# Patient Record
Sex: Male | Born: 1945 | Race: White | Hispanic: No | Marital: Married | Smoking: Former smoker
Health system: Southern US, Community
[De-identification: ages and names within clinical notes are randomized; demographics above are authoritative.]

## PROBLEM LIST (undated history)

## (undated) DIAGNOSIS — I219 Acute myocardial infarction, unspecified: Secondary | ICD-10-CM

## (undated) DIAGNOSIS — I739 Peripheral vascular disease, unspecified: Secondary | ICD-10-CM

## (undated) DIAGNOSIS — F32A Depression, unspecified: Secondary | ICD-10-CM

## (undated) DIAGNOSIS — C3490 Malignant neoplasm of unspecified part of unspecified bronchus or lung: Secondary | ICD-10-CM

## (undated) DIAGNOSIS — D126 Benign neoplasm of colon, unspecified: Secondary | ICD-10-CM

## (undated) DIAGNOSIS — T792XXA Traumatic secondary and recurrent hemorrhage and seroma, initial encounter: Secondary | ICD-10-CM

## (undated) DIAGNOSIS — Z720 Tobacco use: Secondary | ICD-10-CM

## (undated) DIAGNOSIS — F1010 Alcohol abuse, uncomplicated: Secondary | ICD-10-CM

## (undated) DIAGNOSIS — Z95810 Presence of automatic (implantable) cardiac defibrillator: Secondary | ICD-10-CM

## (undated) DIAGNOSIS — Z950 Presence of cardiac pacemaker: Secondary | ICD-10-CM

## (undated) DIAGNOSIS — J449 Chronic obstructive pulmonary disease, unspecified: Secondary | ICD-10-CM

## (undated) DIAGNOSIS — M1990 Unspecified osteoarthritis, unspecified site: Secondary | ICD-10-CM

## (undated) DIAGNOSIS — D696 Thrombocytopenia, unspecified: Secondary | ICD-10-CM

## (undated) DIAGNOSIS — E785 Hyperlipidemia, unspecified: Secondary | ICD-10-CM

## (undated) DIAGNOSIS — I10 Essential (primary) hypertension: Secondary | ICD-10-CM

## (undated) DIAGNOSIS — I509 Heart failure, unspecified: Secondary | ICD-10-CM

## (undated) DIAGNOSIS — F329 Major depressive disorder, single episode, unspecified: Secondary | ICD-10-CM

## (undated) DIAGNOSIS — K7030 Alcoholic cirrhosis of liver without ascites: Secondary | ICD-10-CM

## (undated) DIAGNOSIS — F419 Anxiety disorder, unspecified: Secondary | ICD-10-CM

## (undated) DIAGNOSIS — I447 Left bundle-branch block, unspecified: Secondary | ICD-10-CM

## (undated) DIAGNOSIS — I679 Cerebrovascular disease, unspecified: Secondary | ICD-10-CM

## (undated) HISTORY — PX: BACK SURGERY: SHX140

## (undated) HISTORY — PX: JOINT REPLACEMENT: SHX530

## (undated) HISTORY — DX: Essential (primary) hypertension: I10

## (undated) HISTORY — DX: Thrombocytopenia, unspecified: D69.6

## (undated) HISTORY — DX: Depression, unspecified: F32.A

## (undated) HISTORY — DX: Left bundle-branch block, unspecified: I44.7

## (undated) HISTORY — DX: Acute myocardial infarction, unspecified: I21.9

## (undated) HISTORY — DX: Peripheral vascular disease, unspecified: I73.9

## (undated) HISTORY — PX: INSERT / REPLACE / REMOVE PACEMAKER: SUR710

## (undated) HISTORY — DX: Alcohol abuse, uncomplicated: F10.10

## (undated) HISTORY — DX: Unspecified osteoarthritis, unspecified site: M19.90

## (undated) HISTORY — DX: Tobacco use: Z72.0

## (undated) HISTORY — DX: Chronic obstructive pulmonary disease, unspecified: J44.9

## (undated) HISTORY — DX: Alcoholic cirrhosis of liver without ascites: K70.30

## (undated) HISTORY — DX: Cerebrovascular disease, unspecified: I67.9

## (undated) HISTORY — DX: Major depressive disorder, single episode, unspecified: F32.9

## (undated) HISTORY — DX: Anxiety disorder, unspecified: F41.9

## (undated) HISTORY — DX: Benign neoplasm of colon, unspecified: D12.6

## (undated) HISTORY — DX: Hyperlipidemia, unspecified: E78.5

## (undated) HISTORY — DX: Heart failure, unspecified: I50.9

---

## 1969-11-26 HISTORY — PX: VASECTOMY: SHX75

## 2001-07-17 ENCOUNTER — Emergency Department (HOSPITAL_COMMUNITY): Admission: EM | Admit: 2001-07-17 | Discharge: 2001-07-17 | Payer: Self-pay | Admitting: Emergency Medicine

## 2003-07-02 ENCOUNTER — Ambulatory Visit (HOSPITAL_COMMUNITY): Admission: RE | Admit: 2003-07-02 | Discharge: 2003-07-02 | Payer: Self-pay | Admitting: Pulmonary Disease

## 2003-07-23 ENCOUNTER — Inpatient Hospital Stay (HOSPITAL_COMMUNITY): Admission: RE | Admit: 2003-07-23 | Discharge: 2003-07-26 | Payer: Self-pay | Admitting: Neurosurgery

## 2003-07-23 ENCOUNTER — Encounter: Payer: Self-pay | Admitting: Neurosurgery

## 2006-07-11 ENCOUNTER — Ambulatory Visit (HOSPITAL_COMMUNITY): Admission: RE | Admit: 2006-07-11 | Discharge: 2006-07-11 | Payer: Self-pay | Admitting: Pulmonary Disease

## 2007-07-02 ENCOUNTER — Encounter: Payer: Self-pay | Admitting: Orthopedic Surgery

## 2007-07-02 ENCOUNTER — Ambulatory Visit (HOSPITAL_COMMUNITY): Admission: RE | Admit: 2007-07-02 | Discharge: 2007-07-02 | Payer: Self-pay | Admitting: Pulmonary Disease

## 2007-07-10 ENCOUNTER — Ambulatory Visit (HOSPITAL_COMMUNITY): Admission: RE | Admit: 2007-07-10 | Discharge: 2007-07-10 | Payer: Self-pay | Admitting: Pulmonary Disease

## 2007-07-10 ENCOUNTER — Encounter: Payer: Self-pay | Admitting: Orthopedic Surgery

## 2007-07-23 ENCOUNTER — Ambulatory Visit: Payer: Self-pay | Admitting: Orthopedic Surgery

## 2007-09-30 ENCOUNTER — Encounter: Payer: Self-pay | Admitting: Orthopedic Surgery

## 2007-10-02 ENCOUNTER — Ambulatory Visit: Payer: Self-pay | Admitting: Orthopedic Surgery

## 2007-10-02 DIAGNOSIS — M19019 Primary osteoarthritis, unspecified shoulder: Secondary | ICD-10-CM | POA: Insufficient documentation

## 2007-11-27 DIAGNOSIS — I679 Cerebrovascular disease, unspecified: Secondary | ICD-10-CM

## 2007-11-27 HISTORY — DX: Cerebrovascular disease, unspecified: I67.9

## 2007-12-08 ENCOUNTER — Emergency Department (HOSPITAL_COMMUNITY): Admission: EM | Admit: 2007-12-08 | Discharge: 2007-12-08 | Payer: Self-pay | Admitting: Emergency Medicine

## 2008-01-02 ENCOUNTER — Ambulatory Visit (HOSPITAL_COMMUNITY): Admission: RE | Admit: 2008-01-02 | Discharge: 2008-01-02 | Payer: Self-pay | Admitting: Pulmonary Disease

## 2008-02-05 ENCOUNTER — Encounter: Admission: RE | Admit: 2008-02-05 | Discharge: 2008-02-05 | Payer: Self-pay | Admitting: Neurology

## 2008-02-18 ENCOUNTER — Ambulatory Visit (HOSPITAL_COMMUNITY): Admission: RE | Admit: 2008-02-18 | Discharge: 2008-02-18 | Payer: Self-pay | Admitting: Neurology

## 2008-02-20 ENCOUNTER — Encounter: Payer: Self-pay | Admitting: Interventional Radiology

## 2008-03-10 ENCOUNTER — Inpatient Hospital Stay (HOSPITAL_COMMUNITY): Admission: AD | Admit: 2008-03-10 | Discharge: 2008-03-11 | Payer: Self-pay | Admitting: Interventional Radiology

## 2008-03-24 ENCOUNTER — Encounter: Payer: Self-pay | Admitting: Interventional Radiology

## 2008-04-02 ENCOUNTER — Encounter: Payer: Self-pay | Admitting: Orthopedic Surgery

## 2008-07-02 ENCOUNTER — Ambulatory Visit (HOSPITAL_COMMUNITY): Admission: RE | Admit: 2008-07-02 | Discharge: 2008-07-02 | Payer: Self-pay | Admitting: Interventional Radiology

## 2008-08-23 ENCOUNTER — Emergency Department (HOSPITAL_COMMUNITY): Admission: EM | Admit: 2008-08-23 | Discharge: 2008-08-23 | Payer: Self-pay | Admitting: Emergency Medicine

## 2008-09-17 ENCOUNTER — Observation Stay (HOSPITAL_COMMUNITY): Admission: EM | Admit: 2008-09-17 | Discharge: 2008-09-18 | Payer: Self-pay | Admitting: Emergency Medicine

## 2008-09-28 ENCOUNTER — Inpatient Hospital Stay (HOSPITAL_COMMUNITY): Admission: RE | Admit: 2008-09-28 | Discharge: 2008-10-07 | Payer: Self-pay | Admitting: Interventional Radiology

## 2008-10-11 ENCOUNTER — Ambulatory Visit (HOSPITAL_COMMUNITY): Admission: RE | Admit: 2008-10-11 | Discharge: 2008-10-11 | Payer: Self-pay | Admitting: Interventional Radiology

## 2008-10-20 ENCOUNTER — Encounter: Payer: Self-pay | Admitting: Interventional Radiology

## 2008-11-09 ENCOUNTER — Encounter (INDEPENDENT_AMBULATORY_CARE_PROVIDER_SITE_OTHER): Payer: Self-pay | Admitting: *Deleted

## 2008-11-17 ENCOUNTER — Encounter: Payer: Self-pay | Admitting: Interventional Radiology

## 2008-11-17 ENCOUNTER — Ambulatory Visit (HOSPITAL_COMMUNITY): Admission: RE | Admit: 2008-11-17 | Discharge: 2008-11-17 | Payer: Self-pay | Admitting: Interventional Radiology

## 2008-11-26 HISTORY — PX: LUMBAR FUSION: SHX111

## 2009-03-04 ENCOUNTER — Emergency Department (HOSPITAL_COMMUNITY): Admission: EM | Admit: 2009-03-04 | Discharge: 2009-03-04 | Payer: Self-pay | Admitting: Emergency Medicine

## 2009-03-30 ENCOUNTER — Ambulatory Visit (HOSPITAL_COMMUNITY): Admission: RE | Admit: 2009-03-30 | Discharge: 2009-03-30 | Payer: Self-pay | Admitting: Interventional Radiology

## 2009-07-09 ENCOUNTER — Encounter: Payer: Self-pay | Admitting: Orthopedic Surgery

## 2009-08-08 ENCOUNTER — Encounter (INDEPENDENT_AMBULATORY_CARE_PROVIDER_SITE_OTHER): Payer: Self-pay | Admitting: *Deleted

## 2009-08-08 LAB — CONVERTED CEMR LAB
Albumin: 4.4 g/dL
HDL: 67 mg/dL
LDL Cholesterol: 83 mg/dL
Triglycerides: 65 mg/dL

## 2009-08-18 ENCOUNTER — Encounter: Payer: Self-pay | Admitting: Gastroenterology

## 2009-08-22 ENCOUNTER — Ambulatory Visit: Payer: Self-pay | Admitting: Gastroenterology

## 2009-08-22 ENCOUNTER — Encounter: Payer: Self-pay | Admitting: Gastroenterology

## 2009-08-22 ENCOUNTER — Ambulatory Visit (HOSPITAL_COMMUNITY): Admission: RE | Admit: 2009-08-22 | Discharge: 2009-08-22 | Payer: Self-pay | Admitting: Gastroenterology

## 2009-08-22 HISTORY — PX: COLONOSCOPY: SHX174

## 2009-08-24 ENCOUNTER — Encounter (INDEPENDENT_AMBULATORY_CARE_PROVIDER_SITE_OTHER): Payer: Self-pay

## 2009-09-22 ENCOUNTER — Ambulatory Visit: Payer: Self-pay | Admitting: Orthopedic Surgery

## 2009-09-23 ENCOUNTER — Ambulatory Visit (HOSPITAL_COMMUNITY): Admission: RE | Admit: 2009-09-23 | Discharge: 2009-09-23 | Payer: Self-pay | Admitting: Interventional Radiology

## 2009-09-30 ENCOUNTER — Ambulatory Visit (HOSPITAL_COMMUNITY): Admission: RE | Admit: 2009-09-30 | Discharge: 2009-09-30 | Payer: Self-pay | Admitting: Interventional Radiology

## 2009-11-26 HISTORY — PX: TOTAL SHOULDER ARTHROPLASTY: SHX126

## 2009-11-26 HISTORY — PX: COLONOSCOPY W/ POLYPECTOMY: SHX1380

## 2010-03-21 ENCOUNTER — Ambulatory Visit: Payer: Self-pay | Admitting: Orthopedic Surgery

## 2010-03-31 ENCOUNTER — Ambulatory Visit (HOSPITAL_COMMUNITY): Admission: RE | Admit: 2010-03-31 | Discharge: 2010-03-31 | Payer: Self-pay | Admitting: Interventional Radiology

## 2010-04-04 ENCOUNTER — Telehealth: Payer: Self-pay | Admitting: Orthopedic Surgery

## 2010-08-08 ENCOUNTER — Ambulatory Visit (HOSPITAL_COMMUNITY): Admission: RE | Admit: 2010-08-08 | Discharge: 2010-08-08 | Payer: Self-pay | Admitting: Interventional Radiology

## 2010-09-26 ENCOUNTER — Ambulatory Visit: Admission: RE | Admit: 2010-09-26 | Discharge: 2010-09-26 | Payer: Self-pay | Admitting: Orthopedic Surgery

## 2010-10-02 ENCOUNTER — Encounter (INDEPENDENT_AMBULATORY_CARE_PROVIDER_SITE_OTHER): Payer: Self-pay | Admitting: *Deleted

## 2010-10-03 ENCOUNTER — Ambulatory Visit: Payer: Self-pay | Admitting: Cardiology

## 2010-10-03 DIAGNOSIS — E785 Hyperlipidemia, unspecified: Secondary | ICD-10-CM

## 2010-10-03 DIAGNOSIS — I679 Cerebrovascular disease, unspecified: Secondary | ICD-10-CM

## 2010-10-03 DIAGNOSIS — J4489 Other specified chronic obstructive pulmonary disease: Secondary | ICD-10-CM | POA: Insufficient documentation

## 2010-10-03 DIAGNOSIS — J449 Chronic obstructive pulmonary disease, unspecified: Secondary | ICD-10-CM

## 2010-10-04 ENCOUNTER — Encounter (HOSPITAL_COMMUNITY)
Admission: RE | Admit: 2010-10-04 | Discharge: 2010-11-03 | Payer: Self-pay | Source: Home / Self Care | Attending: Cardiology | Admitting: Cardiology

## 2010-10-04 ENCOUNTER — Ambulatory Visit: Payer: Self-pay | Admitting: Cardiology

## 2010-10-04 ENCOUNTER — Encounter: Payer: Self-pay | Admitting: Cardiology

## 2010-10-10 ENCOUNTER — Ambulatory Visit: Payer: Self-pay | Admitting: Cardiology

## 2010-10-17 ENCOUNTER — Inpatient Hospital Stay (HOSPITAL_COMMUNITY)
Admission: RE | Admit: 2010-10-17 | Discharge: 2010-10-19 | Payer: Self-pay | Source: Home / Self Care | Admitting: Orthopedic Surgery

## 2010-11-09 ENCOUNTER — Ambulatory Visit: Payer: Self-pay | Admitting: Cardiology

## 2010-12-04 ENCOUNTER — Encounter (HOSPITAL_COMMUNITY)
Admission: RE | Admit: 2010-12-04 | Discharge: 2010-12-26 | Payer: Self-pay | Source: Home / Self Care | Attending: Orthopedic Surgery | Admitting: Orthopedic Surgery

## 2010-12-17 ENCOUNTER — Encounter: Payer: Self-pay | Admitting: Pulmonary Disease

## 2010-12-17 ENCOUNTER — Encounter: Payer: Self-pay | Admitting: Interventional Radiology

## 2010-12-28 ENCOUNTER — Encounter (HOSPITAL_COMMUNITY)
Admission: RE | Admit: 2010-12-28 | Discharge: 2010-12-28 | Disposition: A | Discharge status: Home / Self Care | Source: Ambulatory Visit | Attending: Orthopedic Surgery | Admitting: Orthopedic Surgery

## 2010-12-28 DIAGNOSIS — IMO0001 Reserved for inherently not codable concepts without codable children: Secondary | ICD-10-CM | POA: Insufficient documentation

## 2010-12-28 DIAGNOSIS — M25519 Pain in unspecified shoulder: Secondary | ICD-10-CM | POA: Insufficient documentation

## 2010-12-28 DIAGNOSIS — Z96619 Presence of unspecified artificial shoulder joint: Secondary | ICD-10-CM | POA: Insufficient documentation

## 2010-12-28 DIAGNOSIS — M6281 Muscle weakness (generalized): Secondary | ICD-10-CM | POA: Insufficient documentation

## 2010-12-28 DIAGNOSIS — Z4789 Encounter for other orthopedic aftercare: Secondary | ICD-10-CM | POA: Insufficient documentation

## 2010-12-28 NOTE — Assessment & Plan Note (Signed)
Summary: RT SHOULDER PAIN/?XRAY/TRICARE/CAF   Visit Type:  Follow-up  CC:  right shoulder pain.  History of Present Illness: 65 year old male has arthritis of the RIGHT shoulder presents for reevaluation.  Injection given in the RIGHT shoulder 3 times twice here once in [facility].  Injection only lasted a day or 2 last time.   DX: rotator cuff tendinopathy, biceps tendinopathy, glenohumeral arthritis, loose bodies, a.c. joint arthritis and subacromial subdeltoid bursitis  Treatment:  Injection and Relafen 750.  09/22/09 last injection right shoulder, Lasted one day  MRI rt shoulder  2008.  Meds: Lipitor, Plavix, Ramipril, Tramadol, Clonazepam, Premaplus, ASA, B12, Folic acid, Folic acid, Vitamin C. Centrum silver, Potassium Gluconate, Excedrin back and body, Co Q 10.  The patient has a RIGHT internal carotid artery stent which was placed for 90% blockage.  He actually passed out in our office and was sent for immediate medical attention.  However on reevaluation of this lesion the catheter tip injury to blood vessel in his brain and he had a stroke.  He is not a good surgical candidate  I advised him of this    Allergies: No Known Drug Allergies  Past History:  Past Medical History: Last updated: 10/01/2007 shoulder pain neck pain cholesterol  Past Surgical History: Last updated: 10/01/2007 lumbar fusion tooth extraction vascetomy  Physical Exam  Additional Exam:  Is a very thin male otherwise normal development grooming and hygiene.  He is oriented x3.  Mood and affect are normal.  Gait and station are normal.  His RIGHT shoulder is tender over the a.c. joint and posterior anterior joint lines.  His range of motion is limited secondary to pain the shoulder is stable he has weakness in supraspinatus tendon.  Skin is intact.  Pulses and temperature are normal.  No lymph nodes in the axilla.  His sensation is normal.     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 Patient Level III ([address])  Patient Instructions: 1)  Please schedule a follow-up appointment as needed. 2)  call us to refill his medications

## 2010-12-28 NOTE — Progress Notes (Signed)
Summary: changed to tramadol  Phone Note Call from Patient   Summary of Call: Safir Michalec (Jun 08, 2046)  He is taking the Hydrocodone 5/325 as directed 1 every 4 hrs and is not getting any relief at all.  York Spaniel he was taking Tramadol  before and it may have helped just a ittle more.  Pain is constant and cannot sleep at night. He uses [facility] in [place] His # [phone] Initial call taken by: Jacklynn Ganong,  Apr 04, 2010 3:58 PM  Follow-up for Phone Call        reorder tramadol  Follow-up by: Fuller Canada MD,  Apr 04, 2010 4:16 PM    New/Updated Medications: TRAMADOL HCL 50 MG TABS (TRAMADOL HCL) one by mouth q 4 hrs as needed as needed pain Prescriptions: TRAMADOL HCL 50 MG TABS (TRAMADOL HCL) one by mouth q 4 hrs as needed as needed pain  #60 x 1   Entered by:   Ether Griffins   Authorized by:   Fuller Canada MD   Signed by:   Ether Griffins on 04/04/2010   Method used:   Faxed to ...       [facility] [address] [phone]* (retail)       [address], [place]  [address]       Ph: [phone]       Fax: [phone]   RxID:   [id number]

## 2010-12-28 NOTE — Assessment & Plan Note (Signed)
Summary: ***PER RoyCHANDLER FOR SURGICAL CLEARANCE/TG   Visit Type:  Initial Consult Referring Provider:  Neurosurgery-Dr. CabbellRadiology-Roy [name]; Orthopaedics Roy [name] Primary Provider:  Dr. Kari [name]   History of Present Illness: Roy [name] is seen for initial evaluation at the kind request of Roy [name] prior to planned right total shoulder arthroplasty.  This nice gentleman has enjoyed a very active lifestyle without any cardiopulmonary symptoms.  He has never previously been told of cardiac problems, been evaluated by cardiologist nor undergone any significant cardiac testing.  He has known cerebrovascular disease, having undergone stent implantation in the right internal carotid and subsequent angioplasty for restenosis.  Cardiology consultation has been requested due to identification of left bundle branch block on the preoperative ECG.  Prior records obtained and reviewed.  EKG performed in 2004 was normal.  EKGs performed in 2009 revealed left bundle branch block.  Patient apparently not informed of this finding at the time.  EKG  Procedure date:  October 13, 2010  Findings:      Normal sinus rhythm Left axis deviation Left bundle branch block No change when compared with the previous tracing of 09/17/08   Current Medications (verified): 1)  Lipitor 10 Mg Tabs (Atorvastatin Calcium) .... Take 1 Tab Daily 2)  Tramadol Hcl 50 Mg Tabs (Tramadol Hcl) .... One By Mouth Q 4 Hrs As Needed As Needed Pain 3)  Norco 5-325 Mg Tabs (Hydrocodone-Acetaminophen) .[name].. 1 By Mouth Q 4 Hrs As Needed Pain 4)  Plavix 75 Mg Tabs (Clopidogrel Bisulfate) .... Take 1 Tab Daily 5)  Clonazepam 1 Mg Tabs (Clonazepam) .... Take 1 Tab Two Times A Day 6)  Ramipril 2.5 Mg Caps (Ramipril) .... Take 1 Tab Daily 7)  Folic Acid 800 Mcg Tabs (Folic Acid) .... Take 1 Tab Daily 8)  Coq10 200 Mg Caps (Coenzyme Q10) .... Take 1 Tab Daily 9)  Potassium 99 Mg Tabs (Potassium) .... Take 1  Tab Daily 10)  Vitamin B-12 1000 Mcg Tabs (Cyanocobalamin) .... Take 1 Tab Daily 11)  Fish Oil 1000 Mg Caps (Omega-3 Fatty Acids) .... Take 2 Caps Two Times A Day 12)  Vitamin C 500 Mg Chew (Ascorbic Acid) .... Take 1 Tab Daily 13)  Centrum Silver  Tabs (Multiple Vitamins-Minerals) .... Take 1/2 Tab Two Times A Day 14)  Prenatal/iron  Tabs (Prenatal Multivit-Min-Fe-Fa) .... Take 1 Tab Daily 15)  Lipitor 20 Mg Tabs (Atorvastatin Calcium) .... 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 w/o sx Degenerative joint disease-preop for right total shoulder arthroplasty LBBB Hyperlipidemia Hypertension Chronic obstructive pulmonary disease Tobacco abuse-100 pack years Anxiety/depression  Past Surgical History: Lumbosacral spine fusion-2010 Vasectomy-1971 Colonoscopy-2011  Family History: Father:deceased age 48 due to lower extremity gangrene; h/o Alzheimer's and diabetes Mother:deceased age 42 with myocardial infarction Siblings: Sister with Alzheimer's and diabetes died  at age 56 as the result of myocardial infarction; brother deceased secondary to neoplastic disease  Social History: 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  Vital Signs:  Patient profile:   65 year old male Height:      66 inches Weight:      151 pounds BMI:     24.46 Pulse rate:   69 / minute BP sitting:   152 / 77  (right arm)  Vitals Entered By: Roy Saa, CNA (October 03, 2010 1:28 PM)  Physical Exam  General:  Proportionate weight and height; well-developed; no acute distress: HEENT-[place]/AT; PERRL; EOM intact; conjunctiva and lids nl; dentures Neck-No JVD; no carotid bruits Endocrine-No thyromegaly: Lungs-No tachypnea, clear without rales, rhonchi or wheezes: CV-normal PMI; normal S1 and S2; minimal systolic murmur Abdomen-BS normal; soft and non-tender without masses or organomegaly: MS-No deformities, cyanosis or clubbing: Neurologic-Nl cranial nerves; symmetric strength and tone: Skin- Warm, no sig. lesions: Extremities-Nl distal pulses; no edema    Impression & Recommendations:  Problem # 1:  LEFT BUNDLE BRANCH BLOCK (ICD-426.3) Patient's electrocardiographic abnormality has been present for at least the past 2 years and has no bearing on his planned orthopaedic surgery, which is a relatively low risk procedure.  Since he has known vascular disease and an extensive history of tobacco use, testing is warranted for coronary artery disease, the risk of which is somewhat increased in patients with left bundle branch block.  An echocardiogram and pharmacologic stress nuclear cardiac study will be performed, If the results of the studies are negative, as expected, surgery can proceed as planned later this month.  Addendum: 10/10/10      Stress nuclear cardiac testing revealed impaired exercise capacity, low normal left  ventricular systolic function and additional minor abnormalities that may be secondary to the presence of left bundle branch block or to mild myocardial scarring.  This would be considered a low risk study and will not interfere with surgical plans.  Problem # 2:  HYPERTENSION (ICD-401.1) Blood pressure control is suboptimal with a minimal dose of ramipril.  Patient will collect additional measurements at home.  He will certainly need a higher dose of ramipril and perhaps a second agent.  Problem # 3:  HYPERLIPIDEMIA (ICD-272.4) Patient had a slightly suboptimal lipid profile on low-dose atorvastatin.  Dosage will be increased to 20 mg q.d.  CHOL: 163 (08/08/2009)   LDL: 83 (08/08/2009)   HDL: 67 (08/08/2009)   TG: 65 (08/08/2009)  Other Orders: 2-D Echocardiogram (2D Echo) Nuclear Stress Test (Nuc Stress Test)  Patient Instructions: 1)  Your physician recommends that you schedule a follow-up appointment in: next week 2)  Your physician has recommended you make the following change in your medication: increase Lipitor to 20mg  by mouth at bedtime  3)  Your physician has requested that you have an echocardiogram.  Echocardiography is a painless test that uses sound waves to create images of your heart. It provides your doctor with information about the size and shape of your heart and how well your heart's chambers and valves are working.  This procedure takes approximately one hour. There are no restrictions for this procedure. 4)  Your physician has requested that you have  an [employer]  For further information please visit [email or web address]  Please follow instruction sheet, as given. 5)  Your physician has requested that you regularly monitor and record your blood pressure readings at home.  Please use the same machine at the same time of day to check your readings and record them to bring to your follow-up visit. Prescriptions: LIPITOR 20 MG TABS (ATORVASTATIN CALCIUM) take 1 tablet  by mouth at bedtime  #30 x 3   Entered by:   Larita Fife Via LPN   Authorized by:   Kathlen Brunswick, MD, [facility]   Signed by:   Larita Fife Via LPN on 04/54/0981   Method used:   Electronically to        [facility] [phone]* (retail)       [address], [place]  [address]       Ph: [phone]       Fax: [phone]   RxID:   [id number]

## 2010-12-28 NOTE — Assessment & Plan Note (Signed)
Summary: 1 wk f/u per checkout on 10/03/10/tg   Visit Type:  Follow-up Referring Yarlin Breisch:  Neurosurgery-Dr. CabbellRadiology-Dr. Corliss Skains; Orthopaedics Dr. Ave Filter Primary Juanangel Soderholm:  Dr. Kari Baars   History of Present Illness: Mr. Radames Mejorado is seen for evaluation at the kind request of Dr. Jones Broom prior to planned right total shoulder arthroplasty.  This nice gentleman has enjoyed a very active lifestyle without any cardiopulmonary symptoms.  He has never previously been told of cardiac problems, been evaluated by cardiologist nor undergone any significant cardiac testing.  He has known cerebrovascular disease, having undergone stent implantation in the right internal carotid and subsequent angioplasty for restenosis. He also has a history of hypertension, hypercholesterolemia, and ongoing tobacco abuse. Cardiology consultation has been requested due to identification of left bundle branch block on the preoperative ECG.  He was seen last by Dr. Dietrich Pates on 10/03/2010 and prior records were reviewed by him concerning EKG's.  He has had LBBB since 2009.  Also a stress myoview was completed to evaluate for ischemia.  He is without new complaints.  He continues to have soreness in R shoulder.  Surgery is planned for Oct 17, 2010.  Preventive Screening-Counseling & Management  Alcohol-Tobacco     Smoking Status: current     Smoking Cessation Counseling: yes     Smoke Cessation Stage: precontemplative     Packs/Day: 1.5  Current Medications (verified): 1)  Lipitor 20 Mg Tabs (Atorvastatin Calcium) .... Take 1 Tab Daily 2)  Tramadol Hcl 50 Mg Tabs (Tramadol Hcl) .... One By Mouth Q 4 Hrs As Needed As Needed Pain 3)  Norco 5-325 Mg Tabs (Hydrocodone-Acetaminophen) .[name].. 1 By Mouth Q 4 Hrs As Needed Pain 4)  Plavix 75 Mg Tabs (Clopidogrel Bisulfate) .... Plavix  Is On Hold To After Surgery 5)  Clonazepam 1 Mg Tabs (Clonazepam) .... Take 1 Tab Two Times A Day 6)  Ramipril 10 Mg Caps  (Ramipril) .... Take 1 Tablet By Mouth Once Daily 7)  Folic Acid 800 Mcg Tabs (Folic Acid) .... Take 1 Tab Daily 8)  Coq10 200 Mg Caps (Coenzyme Q10) .... Take 1 Tab Daily 9)  Potassium 99 Mg Tabs (Potassium) .... Take 1 Tab Daily 10)  Vitamin B-12 1000 Mcg Tabs (Cyanocobalamin) .... Take 1 Tab Daily 11)  Fish Oil 1000 Mg Caps (Omega-3 Fatty Acids) .... Take 2 Caps Two Times A Day 12)  Vitamin C 500 Mg Chew (Ascorbic Acid) .... Take 1 Tab Daily 13)  Centrum Silver  Tabs (Multiple Vitamins-Minerals) .... Take 1/2 Tab Two Times A Day 14)  Prenatal/iron  Tabs (Prenatal Multivit-Min-Fe-Fa) .... Take 1 Tab Daily  Allergies (verified): No Known Drug Allergies  Comments:  Nurse/Medical Assistant: patient reviewed med list from previous med list and plavix 75 mg is on hold until after surgery and also we changed lipitor from 10 to 20 mg at last ov.  Past History:  Past medical, surgical, family and social histories (including risk factors) reviewed, and no changes noted (except as noted below).  Past Medical History: Reviewed history from 10/03/2010 and no changes required. Cerebrovascular dz-h/o TIA; 2009- right ICA stent; re-intervention for restenosis complicated by [facility] w/o sx Degenerative joint disease-preop for right total shoulder arthroplasty LBBB Hyperlipidemia Hypertension Chronic obstructive pulmonary disease Tobacco abuse-100 pack years Anxiety/depression  Past Surgical History: Reviewed history from 10/03/2010 and no changes required. Lumbosacral spine fusion-2010 Vasectomy-1971 Colonoscopy-2011  Family History: Reviewed history from 10/03/2010 and no changes required. Father:deceased age 67 due to lower extremity gangrene;  h/o Alzheimer's and diabetes Mother:deceased age 24 with myocardial infarction Siblings: Sister with Alzheimer's and diabetes died at age 22 as the result of myocardial infarction; brother deceased secondary to neoplastic disease  Social  History: Reviewed history from 10/03/2010 and no changes required. 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  Packs/Day:  1.5  Review of Systems       Rioght shoulder pain  All other systems have been reviewed and are negative unless stated above.   Vital Signs:  Patient profile:   65 year old male Weight:      150 pounds O2 Sat:      93 % on Room air Pulse rate:   72 / minute BP sitting:   163 / 77  (right arm)  Vitals Entered By: Dreama Saa, CNA (October 10, 2010 11:35 AM)  O2 Flow:  Room air  Physical Exam  General:  Well developed, well nourished, in no acute distress. Lungs:  Clear bilaterally to auscultation and percussion. Smells of cigarrette smoke. Heart:  Non-displaced PMI, chest non-tender; regular rate and rhythm, S1, S2 without murmurs, rubs or gallops. Carotid upstroke normal, no bruit. Normal abdominal aortic size, no bruits. Femorals normal pulses, no bruits. Pedals normal pulses. No edema, no varicosities. Abdomen:  Bowel sounds positive; abdomen soft and non-tender without masses, organomegaly, or hernias noted. No hepatosplenomegaly. Msk:  joint tenderness and decreased ROM.  Right shoulder. Pulses:  pulses normal in all 4 extremities Extremities:  No clubbing or cyanosis. Neurologic:  Alert and oriented x 3. Psych:  Normal affect.   Impression & Recommendations:  Problem # 1:  LEFT BUNDLE BRANCH BLOCK (ICD-426.3) Review of stress myoview completed on 10/04/2010 demonstrates no evidence of ischemia.  He is cleared for orthopedic surgery. His updated medication list for this problem includes:    Plavix 75 Mg Tabs (Clopidogrel bisulfate) .[name]... Plavix  is on hold to after surgery    Ramipril 10 Mg Caps (Ramipril) .[name]... Take 1 tablet by mouth once daily  Problem # 2:  HYPERTENSION (ICD-401.1) He is not well  controlled on this visit and on last visit.  Will increase his ramapril to 5mg  daily.  This may be related to chronic pain, but he will need better blood pressure coverage perioperatively.  This has been explained to the patient who verbalizes understanding. His updated medication list for this problem includes:    Ramipril 10 Mg Caps (Ramipril) .[name]... Take 1 tablet by mouth once daily  Problem # 3:  TOBACCO ABUSE (ICD-305.1) Lengthy discussion of smoking cessation with patient.  He has quit numerous times before, but is increasing his use secondary to right shoulder pain.  I have recommend complete cessation.  He verbalizes understanding.  Patient Instructions: 1)  Your physician recommends that you schedule a follow-up appointment in: 6 months 2)  Your physician has recommended you make the following change in your medication: Increase Ramipril to 10mg  by mouth once daily  3)  Your physician discussed the hazards of tobacco use.  Tobacco use cessation is recommended and techniques and options to help you quit were discussed. 4)  ***[place] QUIT-LINE is 1-800-Quit-NOW*** Prescriptions: RAMIPRIL 10 MG CAPS (RAMIPRIL) take 1 tablet by mouth once daily  #30 x 6   Entered by:   Larita Fife Via LPN   Authorized by:   Joni Reining, NP  Signed by:   Larita Fife Via LPN on 16/08/9603   Method used:   Electronically to        [facility]. [phone]* (retail)       [address], [place]  [address]       Ph: [phone]       Fax: [phone]   RxID:   [id number]   Appended Document: 1 wk f/u per checkout on 10/03/10/tg    Clinical Lists Changes  Observations: Added new observation of ECHOINTERP:  Study Conclusions    - Left ventricle: The cavity size was normal. Wall thickness was     normal. Systolic function was normal. The estimated ejection     fraction was in the range of 55% to 60%. Wall motion was normal;     there were no regional wall motion abnormalities. Doppler      parameters are consistent with abnormal left ventricular     relaxation (grade 1 diastolic dysfunction).   - Ventricular septum: Septal motion showed paradox.   - Mitral valve: Trivial regurgitation.   - Left atrium: The atrium was at the upper limits of normal in size.   - Tricuspid valve: Trivial regurgitation.   - Pericardium, extracardiac: There was no pericardial effusion.  (10/04/2010 12:12)       Echocardiogram  Procedure date:  10/04/2010  Findings:       Study Conclusions    - Left ventricle: The cavity size was normal. Wall thickness was     normal. Systolic function was normal. The estimated ejection     fraction was in the range of 55% to 60%. Wall motion was normal;     there were no regional wall motion abnormalities. Doppler     parameters are consistent with abnormal left ventricular     relaxation (grade 1 diastolic dysfunction).   - Ventricular septum: Septal motion showed paradox.   - Mitral valve: Trivial regurgitation.   - Left atrium: The atrium was at the upper limits of normal in size.   - Tricuspid valve: Trivial regurgitation.   - Pericardium, extracardiac: There was no pericardial effusion.

## 2010-12-28 NOTE — Letter (Signed)
Summary: [place] [facility]readmill (Nuc Med Stress)  [facility]  [address], [place] [address]   Phone: [phone]  Fax: [phone]    Nuclear Medicine 1-Day Stress Test Information Sheet  Re:     Roy [name]   DOB:     Oct 13, 1946 MRN:     [id number] Weight:  Appointment Date: Register at: Appointment Time: Referring MD:  ___Exercise Stress  __Adenosine   __Dobutamine  _X_Lexiscan  __Persantine   __Thallium  Urgency: ____1 (next day)   ____2 (one week)    ____3 (PRN)  Patient will receive Follow Up call with results: Patient needs follow-up appointment:  Instructions regarding medication:  How to prepare for your stress test: 1. DO NOT eat or drink after midnight the night before test. This includes no caffeine (coffee, tea, sodas, chocolate) if you were instructed to take your medications, drink water with it. 2. DO NOT use any tobacco products for at leaset 8 hours prior to arrival. 3. DO NOT wear dresses or any clothing that may have metal clasps or buttons. 4. Wear short sleeve shirts, loose clothing, and comfortalbe walking shoes. 5. DO NOT use lotions, oils or powder on your chest before the test. 6. The test will take approximately 3-4 hours from the time you arrive until completion. 7. To register the day of the test, go to the Short Stay entrance at [facility]. 8. If you must cancel your test, call [phone] as soon as you are aware.  After you arrive for test:   When you arrive at [facility], you will go to Short Stay to be registered. They will then send you to Radiology to check in. The Nuclear Medicine Tech will get you and start an IV in your arm or hand. A small amount of a radioactive tracer will then be injected into your IV. This tracer will then have to circulate for 30-45 minutes. During this time you will wait in the waiting room and you will be able to drink something without caffeine. A series of pictures will be  taken of your heart follwoing this waiting period. After the 1st set of pictures you will go to the stress lab to get ready for your stress test. During the stress test, another small amount of a radioactive tracer will be injected through your IV. When the stress test is complete, there is a short rest period while your heart rate and blood pressure will be monitored. When this monitoring period is complete you will have another set of pictrues taken. (The same as the 1st set of pictures). These pictures are taken between 15 minutes and 1 hour after the stress test. The time depends on the type of stress test you had. Your doctor will inform you of your test results within 7 days after test.    The possibilities of certain changes are possible during the test. They include abnormal blood pressure and disorders of the heart. Side effects of persantine or adenosine can include flushing, chest pain, shortness of breath, stomach tightness, headache and light-headedness. These side effects usually do not last long and are self-resolving. Every effort will be made to keep you comfortable and to minimize complications by obtaining a medical history and by close observation during the test. Emergency equipment, medications, and trained personnel are available to deal with any unusual situation which may arise.  Please notify office at least 48 hours in advance if you are unable to keep this  appt.

## 2010-12-28 NOTE — Assessment & Plan Note (Signed)
Summary: 1 mth nurse visit/bp check per checkout on 09/30/10/tg  Nurse Visit   Vital Signs:  Patient profile:   64 year old male Height:      66 inches Weight:      146 pounds O2 Sat:      96 % on Room air Pulse rate:   87 / minute BP sitting:   153 / 75  (left arm)  Vitals Entered By: Teressa Lower RN (November 09, 2010 3:46 PM)  O2 Flow:  Room air  Current Medications (verified): 1)  Lipitor 20 Mg Tabs (Atorvastatin Calcium) .... Take 1 Tab Daily 2)  Plavix 75 Mg Tabs (Clopidogrel Bisulfate) .... Plavix  Is On Hold To After Surgery 3)  Clonazepam 1 Mg Tabs (Clonazepam) .... Take 1 Tab Two Times A Day 4)  Ramipril 10 Mg Caps (Ramipril) .... Take 1 Tablet By Mouth Once Daily 5)  Folic Acid 800 Mcg Tabs (Folic Acid) .... Take 1 Tab Daily 6)  Coq10 200 Mg Caps (Coenzyme Q10) .... Take 1 Tab Daily 7)  Potassium 99 Mg Tabs (Potassium) .... Take 1 Tab Daily 8)  Vitamin B-12 1000 Mcg Tabs (Cyanocobalamin) .... Take 1 Tab Daily 9)  Fish Oil 1000 Mg Caps (Omega-3 Fatty Acids) .... Take 2 Caps Two Times A Day 10)  Vitamin C 500 Mg Chew (Ascorbic Acid) .... Take 1 Tab Daily 11)  Centrum Silver  Tabs (Multiple Vitamins-Minerals) .... Take 1/2 Tab Two Times A Day 12)  Prenatal/iron  Tabs (Prenatal Multivit-Min-Fe-Fa) .... Take 1 Tab Daily 13)  Vitamin B-1 100 Mg Tabs (Thiamine Hcl) .... 1/2 Tablet Daily  Allergies (verified): No Known Drug Allergies  Visit Type:  1 month nurse visit Referring Tearia Gibbs:  Neurosurgery-Dr. CabbellRadiology-Dr. Corliss Skains; Orthopaedics [address] [facility] Primary Jasmin Winberry:  Dr. Kari Baars   History of Present Illness: S:  1 month nurse visit B: ov 10/10/2010, increased ramipril to 10mg  daily A no c/o R  Appended Document: 1 mth nurse visit/bp check per checkout on 09/30/10/tg Pt made aware

## 2010-12-28 NOTE — Medication Information (Signed)
Summary: Tax adviser   Imported By: Cammie Sickle 03/30/2010 09:15:07  _____________________________________________________________________  External Attachment:    Type:   Image     Comment:   External Document

## 2010-12-28 NOTE — Letter (Signed)
Summary: History form  History form   Imported By: Jacklynn Ganong 03/30/2010 10:14:25  _____________________________________________________________________  External Attachment:    Type:   Image     Comment:   External Document

## 2010-12-28 NOTE — Miscellaneous (Signed)
Summary: labs lipids,liver,08/08/2009  Clinical Lists Changes  Observations: Added new observation of ALBUMIN: 4.4 g/dL (81/19/1478 29:56) Added new observation of PROTEIN, TOT: 6.9 g/dL (21/30/8657 84:69) Added new observation of SGPT (ALT): 17 units/L (08/08/2009 14:08) Added new observation of SGOT (AST): 17 units/L (08/08/2009 14:08) Added new observation of ALK PHOS: 95 units/L (08/08/2009 14:08) Added new observation of BILI DIRECT: 0.1 mg/dL (62/95/2841 32:44) Added new observation of LDL: 83 mg/dL (11/28/7251 66:44) Added new observation of HDL: 67 mg/dL (03/47/4259 56:38) Added new observation of TRIGLYC TOT: 65 mg/dL (75/64/3329 51:88) Added new observation of CHOLESTEROL: 163 mg/dL (41/66/0630 16:01)

## 2011-01-02 ENCOUNTER — Ambulatory Visit (HOSPITAL_COMMUNITY)
Admission: RE | Admit: 2011-01-02 | Discharge: 2011-01-02 | Disposition: A | Discharge status: Home / Self Care | Source: Ambulatory Visit | Attending: Orthopedic Surgery | Admitting: Orthopedic Surgery

## 2011-01-02 DIAGNOSIS — I10 Essential (primary) hypertension: Secondary | ICD-10-CM | POA: Insufficient documentation

## 2011-01-02 DIAGNOSIS — M25519 Pain in unspecified shoulder: Secondary | ICD-10-CM | POA: Insufficient documentation

## 2011-01-02 DIAGNOSIS — M25619 Stiffness of unspecified shoulder, not elsewhere classified: Secondary | ICD-10-CM | POA: Insufficient documentation

## 2011-01-02 DIAGNOSIS — IMO0001 Reserved for inherently not codable concepts without codable children: Secondary | ICD-10-CM | POA: Insufficient documentation

## 2011-01-02 DIAGNOSIS — M6281 Muscle weakness (generalized): Secondary | ICD-10-CM | POA: Insufficient documentation

## 2011-01-05 ENCOUNTER — Ambulatory Visit (HOSPITAL_COMMUNITY): Payer: Self-pay | Admitting: Specialist

## 2011-02-06 LAB — BASIC METABOLIC PANEL
BUN: 5 mg/dL — ABNORMAL LOW (ref 6–23)
BUN: 7 mg/dL (ref 6–23)
CO2: 25 mEq/L (ref 19–32)
CO2: 27 mEq/L (ref 19–32)
CO2: 30 mEq/L (ref 19–32)
Calcium: 7.8 mg/dL — ABNORMAL LOW (ref 8.4–10.5)
Calcium: 8.1 mg/dL — ABNORMAL LOW (ref 8.4–10.5)
Calcium: 9.9 mg/dL (ref 8.4–10.5)
Chloride: 104 mEq/L (ref 96–112)
Chloride: 98 mEq/L (ref 96–112)
Creatinine, Ser: 0.59 mg/dL (ref 0.4–1.5)
Creatinine, Ser: 0.63 mg/dL (ref 0.4–1.5)
Creatinine, Ser: 0.65 mg/dL (ref 0.4–1.5)
GFR calc Af Amer: >60 mL/min (ref >60)
GFR calc Af Amer: >60 mL/min (ref >60)
GFR calc Af Amer: >60 mL/min (ref >60)
GFR calc non Af Amer: >60 mL/min (ref >60)
GFR calc non Af Amer: >60 mL/min (ref >60)
Glucose, Bld: 127 mg/dL — ABNORMAL HIGH (ref 70–99)
Glucose, Bld: 132 mg/dL — ABNORMAL HIGH (ref 70–99)
Glucose, Bld: 94 mg/dL (ref 70–99)
Potassium: 3.8 mEq/L (ref 3.5–5.1)
Potassium: 4.9 mEq/L (ref 3.5–5.1)
Sodium: 136 mEq/L (ref 135–145)
Sodium: 138 mEq/L (ref 135–145)

## 2011-02-06 LAB — URINALYSIS, ROUTINE W REFLEX MICROSCOPIC
Hgb urine dipstick: NEGATIVE
Nitrite: NEGATIVE
Protein, ur: NEGATIVE mg/dL
Urobilinogen, UA: 0.2 mg/dL (ref 0.0–1.0)

## 2011-02-06 LAB — APTT
aPTT: 32 seconds (ref 24–37)
aPTT: 41 seconds — ABNORMAL HIGH (ref 24–37)

## 2011-02-06 LAB — LIPID PANEL
Cholesterol: 96 mg/dL (ref 0–200)
HDL: 53 mg/dL (ref >39)
LDL Cholesterol: 35 mg/dL (ref 0–99)
Total CHOL/HDL Ratio: 1.8 RATIO
Triglycerides: 40 mg/dL (ref <150)
VLDL: 8 mg/dL (ref 0–40)

## 2011-02-06 LAB — PROTIME-INR
INR: 0.93 (ref 0.00–1.49)
INR: 1.37 (ref 0.00–1.49)
Prothrombin Time: 12.7 seconds (ref 11.6–15.2)
Prothrombin Time: 17.1 seconds — ABNORMAL HIGH (ref 11.6–15.2)

## 2011-02-06 LAB — DIFFERENTIAL
Eosinophils Absolute: 0.5 10*3/uL (ref 0.0–0.7)
Lymphocytes Relative: 31 % (ref 12–46)
Lymphs Abs: 2.3 10*3/uL (ref 0.7–4.0)
Neutro Abs: 4.2 10*3/uL (ref 1.7–7.7)
Neutrophils Relative %: 56 % (ref 43–77)

## 2011-02-06 LAB — CBC
Hemoglobin: 16.4 g/dL (ref 13.0–17.0)
MCH: 29.7 pg (ref 26.0–34.0)
MCH: 29.9 pg (ref 26.0–34.0)
MCH: 30.8 pg (ref 26.0–34.0)
MCHC: 34.2 g/dL (ref 30.0–36.0)
MCHC: 34.4 g/dL (ref 30.0–36.0)
Platelets: 165 10*3/uL (ref 150–400)
Platelets: 173 10*3/uL (ref 150–400)
Platelets: 227 10*3/uL (ref 150–400)
RBC: 3.51 MIL/uL — ABNORMAL LOW (ref 4.22–5.81)
RBC: 5.32 MIL/uL (ref 4.22–5.81)
WBC: 7.5 10*3/uL (ref 4.0–10.5)

## 2011-02-06 LAB — GLUCOSE, CAPILLARY: Glucose-Capillary: 108 mg/dL — ABNORMAL HIGH (ref 70–99)

## 2011-02-06 LAB — TYPE AND SCREEN: ABO/RH(D): O POS

## 2011-02-06 LAB — HEMOGLOBIN A1C
Hgb A1c MFr Bld: 5.9 % — ABNORMAL HIGH (ref <5.7)
Mean Plasma Glucose: 123 mg/dL — ABNORMAL HIGH (ref <117)

## 2011-02-07 LAB — TYPE AND SCREEN: ABO/RH(D): O POS

## 2011-02-07 LAB — URINALYSIS, ROUTINE W REFLEX MICROSCOPIC
Bilirubin Urine: NEGATIVE
Hgb urine dipstick: NEGATIVE
Ketones, ur: NEGATIVE mg/dL
Specific Gravity, Urine: 1.003 — ABNORMAL LOW (ref 1.005–1.030)
pH: 6.5 (ref 5.0–8.0)

## 2011-02-07 LAB — SURGICAL PCR SCREEN: MRSA, PCR: NEGATIVE

## 2011-02-07 LAB — BASIC METABOLIC PANEL
CO2: 31 mEq/L (ref 19–32)
Calcium: 9.9 mg/dL (ref 8.4–10.5)
Glucose, Bld: 97 mg/dL (ref 70–99)
Sodium: 141 mEq/L (ref 135–145)

## 2011-02-07 LAB — CBC
Hemoglobin: 16.2 g/dL (ref 13.0–17.0)
MCH: 31 pg (ref 26.0–34.0)
MCHC: 35.1 g/dL (ref 30.0–36.0)
RDW: 12.9 % (ref 11.5–15.5)

## 2011-02-07 LAB — DIFFERENTIAL
Basophils Absolute: 0 10*3/uL (ref 0.0–0.1)
Basophils Relative: 1 % (ref 0–1)
Eosinophils Absolute: 0.7 10*3/uL (ref 0.0–0.7)
Monocytes Absolute: 0.6 10*3/uL (ref 0.1–1.0)
Monocytes Relative: 7 % (ref 3–12)
Neutrophils Relative %: 53 % (ref 43–77)

## 2011-02-07 LAB — PROTIME-INR: INR: 0.93 (ref 0.00–1.49)

## 2011-02-08 LAB — COMPREHENSIVE METABOLIC PANEL
ALT: 20 U/L (ref 0–53)
Alkaline Phosphatase: 106 U/L (ref 39–117)
CO2: 27 mEq/L (ref 19–32)
Chloride: 106 mEq/L (ref 96–112)
GFR calc non Af Amer: >60 mL/min (ref >60)
Glucose, Bld: 113 mg/dL — ABNORMAL HIGH (ref 70–99)
Potassium: 4.4 mEq/L (ref 3.5–5.1)
Sodium: 139 mEq/L (ref 135–145)
Total Bilirubin: 0.5 mg/dL (ref 0.3–1.2)

## 2011-02-08 LAB — PROTIME-INR: Prothrombin Time: 13.1 seconds (ref 11.6–15.2)

## 2011-02-08 LAB — CBC
HCT: 44.5 % (ref 39.0–52.0)
Hemoglobin: 15.5 g/dL (ref 13.0–17.0)
WBC: 7.1 10*3/uL (ref 4.0–10.5)

## 2011-02-13 LAB — CREATININE, SERUM: GFR calc Af Amer: >60 mL/min (ref >60)

## 2011-02-28 LAB — BASIC METABOLIC PANEL
CO2: 27 mEq/L (ref 19–32)
Calcium: 9.1 mg/dL (ref 8.4–10.5)
Chloride: 101 mEq/L (ref 96–112)
GFR calc Af Amer: >60 mL/min (ref >60)
Sodium: 135 mEq/L (ref 135–145)

## 2011-02-28 LAB — PROTIME-INR: INR: 0.93 (ref 0.00–1.49)

## 2011-02-28 LAB — CBC
Hemoglobin: 15.8 g/dL (ref 13.0–17.0)
MCHC: 34.5 g/dL (ref 30.0–36.0)
RBC: 5.08 MIL/uL (ref 4.22–5.81)

## 2011-03-01 LAB — CREATININE, SERUM
Creatinine, Ser: 0.62 mg/dL (ref 0.4–1.5)
GFR calc Af Amer: >60 mL/min (ref >60)

## 2011-03-03 ENCOUNTER — Other Ambulatory Visit: Payer: Self-pay | Admitting: Cardiology

## 2011-03-06 ENCOUNTER — Other Ambulatory Visit: Payer: Self-pay

## 2011-03-06 LAB — CBC
Platelets: 224 10*3/uL (ref 150–400)
RDW: 13 % (ref 11.5–15.5)

## 2011-03-06 LAB — APTT: aPTT: 30 seconds (ref 24–37)

## 2011-03-06 LAB — BASIC METABOLIC PANEL
BUN: 13 mg/dL (ref 6–23)
Calcium: 9.4 mg/dL (ref 8.4–10.5)
Creatinine, Ser: 0.55 mg/dL (ref 0.4–1.5)
GFR calc non Af Amer: >60 mL/min (ref >60)
Glucose, Bld: 104 mg/dL — ABNORMAL HIGH (ref 70–99)
Sodium: 143 mEq/L (ref 135–145)

## 2011-03-06 LAB — PROTIME-INR
INR: 1 (ref 0.00–1.49)
Prothrombin Time: 13.4 seconds (ref 11.6–15.2)

## 2011-03-06 MED ORDER — ATORVASTATIN CALCIUM 20 MG PO TABS: 20.0000 mg | ORAL_TABLET | Freq: Every day | ORAL | Status: DC

## 2011-04-02 ENCOUNTER — Other Ambulatory Visit: Payer: Self-pay

## 2011-04-02 ENCOUNTER — Other Ambulatory Visit: Payer: Self-pay | Admitting: Adult Health

## 2011-04-02 DIAGNOSIS — E785 Hyperlipidemia, unspecified: Secondary | ICD-10-CM

## 2011-04-02 MED ORDER — ATORVASTATIN CALCIUM 20 MG PO TABS: 20.0000 mg | ORAL_TABLET | Freq: Every day | ORAL | Status: DC

## 2011-04-06 DIAGNOSIS — I10 Essential (primary) hypertension: Secondary | ICD-10-CM

## 2011-04-06 DIAGNOSIS — F419 Anxiety disorder, unspecified: Secondary | ICD-10-CM

## 2011-04-06 DIAGNOSIS — I119 Hypertensive heart disease without heart failure: Secondary | ICD-10-CM | POA: Insufficient documentation

## 2011-04-06 DIAGNOSIS — F329 Major depressive disorder, single episode, unspecified: Secondary | ICD-10-CM | POA: Insufficient documentation

## 2011-04-09 ENCOUNTER — Ambulatory Visit (INDEPENDENT_AMBULATORY_CARE_PROVIDER_SITE_OTHER): Admitting: Cardiology

## 2011-04-09 ENCOUNTER — Encounter: Payer: Self-pay | Admitting: *Deleted

## 2011-04-09 ENCOUNTER — Encounter: Payer: Self-pay | Admitting: Cardiology

## 2011-04-09 DIAGNOSIS — D696 Thrombocytopenia, unspecified: Secondary | ICD-10-CM

## 2011-04-09 DIAGNOSIS — F1010 Alcohol abuse, uncomplicated: Secondary | ICD-10-CM

## 2011-04-09 DIAGNOSIS — M1990 Unspecified osteoarthritis, unspecified site: Secondary | ICD-10-CM

## 2011-04-09 DIAGNOSIS — Z720 Tobacco use: Secondary | ICD-10-CM

## 2011-04-09 DIAGNOSIS — I447 Left bundle-branch block, unspecified: Secondary | ICD-10-CM

## 2011-04-09 DIAGNOSIS — F1020 Alcohol dependence, uncomplicated: Secondary | ICD-10-CM | POA: Insufficient documentation

## 2011-04-09 DIAGNOSIS — F17200 Nicotine dependence, unspecified, uncomplicated: Secondary | ICD-10-CM

## 2011-04-09 MED ORDER — LISINOPRIL 20 MG PO TABS: 20.0000 mg | ORAL_TABLET | Freq: Every day | ORAL | Status: DC

## 2011-04-09 MED ORDER — HYDROCHLOROTHIAZIDE 25 MG PO TABS: 12.5000 mg | ORAL_TABLET | Freq: Every day | ORAL | Status: DC

## 2011-04-09 NOTE — Assessment & Plan Note (Signed)
Patient would benefit from discontinuation of cigarette smoking, but that does not appear feasible at present.

## 2011-04-09 NOTE — Assessment & Plan Note (Signed)
Blood pressure control is marginally suboptimal.  Ramipril will be changed to lisinopril HCT 20/12.5 mg q.d with a chemistry profile to be obtained in one month.  Continued cardiology care does not appear necessary with Dr. Juanetta Gosling managing hypertension and hyperlipidemia.  I will be happy to see this gentleman again at any time specialty cardiology care is required.

## 2011-04-09 NOTE — Patient Instructions (Signed)
Your physician recommends that you schedule a follow-up appointment in: as needed Your physician has recommended you make the following change in your medication: change ramipril to lisinopril 20mg  dialy and hydrochlorathiazide to 25mg    1/2 tablet daily .Your physician recommends that you return for lab work in: 1 month Your physician has requested that you regularly monitor and record your blood pressure readings at home. Please use the same machine at the same time of day to check your readings and record them to bring to your follow-up visit.  Return to Dr. Juanetta Gosling

## 2011-04-09 NOTE — Progress Notes (Signed)
HPI : Roy [name] returns to the office for continued assessment and treatment of hypertension, cerebrovascular disease and additional vascular risk factors.  Since his last visit, he has been extremely well.  Total shoulder arthroplasty proceeded without complications and with a good outcome.  He remains active without dyspnea or chest discomfort.  Unfortunately, he continues to smoke cigarettes and indicates that he does not foresee ever stopping.  Recent lipid profile was excellent.  Patient does not assess blood pressure control at home although he has a sphygmomanometer.  Current Outpatient Prescriptions on File Prior to Visit  Medication Sig Dispense Refill  . atorvastatin (LIPITOR) 20 MG tablet Take 1 tablet (20 mg total) by mouth daily.  30 tablet  0  . clonazePAM (KLONOPIN) 1 MG tablet Take 1 mg by mouth 2 (two) times daily as needed.        . clopidogrel (PLAVIX) 75 MG tablet Take 75 mg by mouth daily.        . Coenzyme Q10 (COQ10) 200 MG CAPS Take 1 capsule by mouth daily.        . folic acid (FOLVITE) 800 MCG tablet Take 800 mcg by mouth daily.        . Multiple Vitamins-Minerals (CENTRUM SILVER PO) Take 1 tablet by mouth daily.        . Omega-3 Fatty Acids (FISH OIL) 1000 MG CAPS Take 1 capsule by mouth daily.        . ramipril (ALTACE) 10 MG tablet Take 10 mg by mouth daily.        . Thiamine HCl (VITAMIN B-1) 100 MG tablet Take 100 mg by mouth daily. 1/2 tab daily       . vitamin B-12 (CYANOCOBALAMIN) 1000 MCG tablet Take 1,000 mcg by mouth daily.        . vitamin C (ASCORBIC ACID) 500 MG tablet Take 500 mg by mouth daily.        [name] DISCONTD: Potassium 99 MG TABS Take 1 tablet by mouth daily.        [name] DISCONTD: Prenatal Vit-Fe Fumarate-FA (PRENAPLUS PO) Take 1 capsule by mouth daily.           No Known Allergies    Past medical history, social history, and family history reviewed and updated.  ROS: No orthopnea, PND, peripheral edema, focal weakness, cough or sputum  production.  PHYSICAL EXAM: BP 153/85  Pulse 69  Ht 5\' 6"  (1.676 m)  Wt 153 lb (69.4 kg)  BMI 24.69 kg/m2  SpO2 96%  General-Well developed; no acute distress Body habitus-proportionate weight and height Neck-No JVD; modest right carotid bruits Lungs-clear lung fields; resonant to percussion; increased A-P diameter; mildly prolonged expiratory phase Cardiovascular-normal PMI; normal S1 and S2 Abdomen-normal bowel sounds; soft and non-tender without masses or organomegaly Musculoskeletal-No deformities, no cyanosis or clubbing Neurologic-Normal cranial nerves; symmetric strength and tone Skin-Warm, no significant lesions Extremities-distal pulses intact; no edema  ASSESSMENT AND PLAN:

## 2011-04-09 NOTE — Assessment & Plan Note (Signed)
Lipid profile was excellent 4 months ago (154, 58, 73, 69).  Current therapy is effective.

## 2011-04-09 NOTE — Assessment & Plan Note (Signed)
Managed by Dr. Romelle Starcher with annual cerebral angiograms.

## 2011-04-10 NOTE — H&P (Signed)
NAME:  Roy [name], Roy [name]               ACCOUNT NO.:  [id number]   MEDICAL RECORD NO.:  [id number]          PATIENT TYPE:  INP   LOCATION:  A338                          FACILITY:  [facility]   PHYSICIAN:  Kingsley Callander. Ouida Sills, MD       DATE OF BIRTH:  1946-06-10   DATE OF ADMISSION:  09/17/2008  DATE OF DISCHARGE:  10/24/2009LH                              HISTORY & PHYSICAL   CHIEF COMPLAINT:  Elevated blood pressure.   HISTORY OF PRESENT ILLNESS:  This patient is a [name] who presented to the emergency room after being directed to do so  by Dr. Adah Perl office after the blood pressure was elevated in the  180/110 range at home.  The patient was found to be in alcohol  withdrawal.  He had been drinking up to 12 beers a day for years.  He  had stopped 3 days prior to admission.  He had been taking Klonopin  twice a day.  He denied any seizures or falls.  He had eaten a fairly  well over time.  He denies any history of liver problems, but has an  abnormal SGOT.  He denies jaundice.  He vomited on the day prior to  admission.  After presentation, his blood pressure improved.   PAST MEDICAL HISTORY:  1. Status post cerebral vessel stent earlier this year.  2. Lumbar fusion.  3. Hyperlipidemia.  4. Hypertension.   MEDICATIONS:  1. Lipitor 10 mg daily.  2. Aspirin 325 mg daily.  3. Relafen 750 mg b.i.d.  4. Tramadol 50 mg p.r.n.  5. Meclizine p.r.n.  6. Plavix 75 mg daily.  7. Clonazepam 0.5 mg b.i.d.  8. Reglan 10 mg p.r.n.   ALLERGIES:  None.   SOCIAL HISTORY:  He smokes a pack-and-half of cigarettes a day.  Alcohol  history as above.  He does not use recreational drugs.  He is a former  Personnel officer.   FAMILY HISTORY:  His brother died of prostate cancer at age 84.  His  father died of gangrene of his leg at 22.  His mother died of an MI at  62.   REVIEW OF SYSTEMS:  Noncontributory.   PHYSICAL EXAMINATION:  VITAL SIGNS:  Temperature 98.4, blood pressure  160/96,  pulse 112, respirations 22, oxygen saturation 98%.  GENERAL:  Alert, mildly shaky.  HEENT:  No scleral icterus.  Pharynx is edentulous.  NECK:  No JVD or thyromegaly.  LUNGS:  Clear.  HEART:  Regular with no murmurs.  ABDOMEN:  Soft, nontender.  No palpable organomegaly.  EXTREMITIES:  Normal pulses.  No edema.  NEURO:  No focal weakness.   LABORATORY DATA:  Sodium 134, potassium 3.6, bicarb 26, glucose 93, BUN  5, creatinine 0.6, calcium 8.8, SGOT 92, alcohol level less than 5.  The  urinalysis is negative.  The drug screen is negative.  White count 3.7,  hematocrit 44.5, platelets 68,000.  The chest x-ray reveals no acute  infiltrate.   IMPRESSION:  1. Alcohol withdrawal.  He will be treated with thiamine,      multivitamin, folic  acid, magnesium, and Ativan.  2. History of intracranial cerebrovascular disease.  Continue aspirin,      Plavix, and Lipitor.  3. Presenting hypertension, now improved.  We will observe.      Kingsley Callander. Ouida Sills, MD  Electronically Signed     ROF/MEDQ  D:  09/18/2008  T:  09/19/2008  Job:  [id number]   cc:   Dr. Juanetta Gosling

## 2011-04-10 NOTE — H&P (Signed)
NAME:  Roy [name], Roy [name]               ACCOUNT NO.:  [id number]   MEDICAL RECORD NO.:  [id number]          PATIENT TYPE:  OIB   LOCATION:  3172                         FACILITY:  [facility]   PHYSICIAN:  Sanjeev K. Deveshwar, M.D.DATE OF BIRTH:  03-22-1946   DATE OF ADMISSION:  03/10/2008  DATE OF DISCHARGE:                              HISTORY & PHYSICAL   CHIEF COMPLAINTS:  Cerebrovascular disease.   HISTORY OF PRESENT ILLNESS:  This is a pleasant 65 year old male who was  referred to Dr. Corliss Skains through the courtesy of Dr. Anne Hahn for  evaluation of dizziness.  It was felt that the patient might have  possibly had a TIA.  An arteriogram was performed by Dr. Corliss Skains on  March 25 that showed a 90% right internal carotid artery stenosis.  Dr.  Corliss Skains met with the patient and his family on February 20, 2008.  At  that time PTA stenting was discussed along with the risks and benefits.  The patient agreed to proceed.  He returns today to be admitted for that  procedure.   PAST MEDICAL HISTORY:  Significant for vertigo, hyperlipidemia, history  of alcohol use, history of ongoing tobacco use, COPD, degenerative joint  disease, possible TIAs and history of anxiety.   SURGICAL HISTORY:  Significant for lumbar fusion and a vasectomy.  The  lumbar fusion was performed by Dr. Franky Macho.  The patient denies any  previous problems with anesthesia.   CURRENT MEDICATIONS:  Lipitor, Antivert, Klonopin, Relafen, vitamins and  supplements, aspirin, and he was recently started on Plavix in  anticipation of stent placement.   SOCIAL HISTORY:  The patient is married.  He has 2 children.  He lives  in [place] with his daughter and wife.  He smokes 2 packs of  cigarettes per day.  He drinks 12 bottles of beer per day.  He is  retired and unemployed.   FAMILY HISTORY:  His mother died at age 62 from natural causes.  She had  Alzheimer's disease and diabetes.  His father died at age 64 from  gangrene.   He also had Alzheimer's and diabetes.  He has one brother who  is deceased from cancer.  One sister alive who also has Alzheimer's and  diabetes.   REVIEW OF SYSTEMS:  Completely negative except for a chronic cough  productive of clear sputum, arthritis with pain in his right shoulder  and his back.  He reports that he bruises easily.  He does have  difficulty lying flat.   LABORATORY DATA:  An INR 0.90, PTT is 28, hemoglobin 15.6, hematocrit  44.5, WBC 6,400, platelets 125,000, BUN 6, creatinine 0.5, potassium  4.4, GFR was greater than 60.   PHYSICAL EXAM:  Reveals a pleasant 65 year old white male in no acute  distress.  VITAL SIGNS:  Blood pressure 136/87, pulse 81, respirations 20,  temperature 97.1, oxygen saturation 97%.  HEENT is unremarkable.  NECK:  Reveals no bruits.  Heart reveals regular rate and rhythm with distant heart sounds.  Abdomen is nontender.  Lungs are decreased but clear.  EXTREMITIES:  Reveal pulses to be intact.  There is no significant  edema.  His airway is rated at 1.  His ASA scale is rated at a 3.  Neurological exam was completely normal with motor strength 5/5 although  the patient did have difficulty with finger-to-nose testing.  He had a  significant tremor bilaterally.   IMPRESSION:  1. Right internal carotid artery stenosis.  2. Long history of dizziness possible transient ischemic attacks.  3. Hyperlipidemia.  4. Significant alcohol intake.  5. Ongoing tobacco use.  6. Chronic obstructive pulmonary disease.  7. Degenerative joint disease.  8. History of anxiety.  9. Previous cerebral arteriogram performed February 18, 2008 revealing a      90% right internal carotid artery stenosis.   PLAN:  The patient is to undergo PTA and stenting of the right internal  carotid artery today to be performed by Dr. Corliss Skains under general  anesthesia.      Delton See, P.A.    ______________________________  Grandville Silos. Corliss Skains, M.D.     DR/MEDQ  D:  03/10/2008  T:  03/10/2008  Job:  [id number]   cc:   Ramon Dredge L. Juanetta Gosling, M.D.  Marlan Palau, M.D.

## 2011-04-10 NOTE — Group Therapy Note (Signed)
NAME:  Roy [name], Roy [name]               ACCOUNT NO.:  [id number]   MEDICAL RECORD NO.:  [id number]          PATIENT TYPE:  INP   LOCATION:  A338                          FACILITY:  [facility]   PHYSICIAN:  Edward L. Juanetta Gosling, M.D.DATE OF BIRTH:  [birth year]   DATE OF PROCEDURE:  09/18/2008  DATE OF DISCHARGE:  09/18/2008                                 PROGRESS NOTE   SUBJECTIVE:  Roy [name] looks much better.  He has no new complaints.  He  is much less tremulous and I do not think he is having a severe alcohol  withdrawal syndrome.  He wanted to go home, and I think that is  appropriate.  He does have Klonopin available, and I will send him home  on multiple vitamin as well.   On exam this morning shows that he has minimal tremor.  Temperature is  98.5, pulse 69, respirations 20, blood pressure 163/85, and O2 sat 98%  on room air.  His chest is clear.  He looks comfortable.   ASSESSMENT:  He is better.   PLAN:  Discharge home.   Please see discharge summary for details.      Edward L. Juanetta Gosling, M.D.  Electronically Signed     ELH/MEDQ  D:  09/18/2008  T:  09/19/2008  Job:  [id number]

## 2011-04-10 NOTE — H&P (Signed)
NAME:  Roy [name], Roy [name]               ACCOUNT NO.:  [id number]   MEDICAL RECORD NO.:  [id number]          PATIENT TYPE:  OIB   LOCATION:  3172                         FACILITY:  [facility]   PHYSICIAN:  Sanjeev K. Deveshwar, M.D.DATE OF BIRTH:  01/12/1946   DATE OF ADMISSION:  09/28/2008  DATE OF DISCHARGE:  09/18/2008                              HISTORY & PHYSICAL   CHIEF COMPLAINT:  Cerebrovascular disease.   HISTORY OF PRESENT ILLNESS:  This is a pleasant 65 year old male with a  history of cerebrovascular disease and TIAs referred to Dr. Corliss Skains  through the courtesy of Dr. Lesia Sago for evaluation of  cerebrovascular disease.  The patient eventually underwent stent  assisted angioplasty of a 90% right internal carotid artery stenosis on  March 10, 2008.  The patient initially did well.  However, a follow-up  angiogram performed July 02, 2008, showed a 70% in-stent stenosis as  well as a 50% left internal carotid artery stenosis.  Arrangements were  made to have the patient return to [facility] today for a  repeat angiogram and possible angioplasty or stenting of the right  internal carotid artery in-stent stenosis.   PAST MEDICAL HISTORY:  As noted, the patient had a right internal  carotid artery stent assisted angioplasty performed March 10, 2008 after  presenting with TIA symptoms.  He has a history of alcohol abuse and was  recently admitted to [facility] September 17, 2008, to September 18, 2008, for withdrawal symptoms.  The patient states she has been  alcohol free since that time.  He has a history of ongoing tobacco use  with probable COPD, history of hyperlipidemia, anxiety, previous  thrombocytopenia and hypertension.   SURGICAL HISTORY:  Significant for a lumbar fusion and a vasectomy.  His  neurosurgeon in the past has been Dr. Franky Macho.  The patient denies any  previous problems with anesthesia.   ALLERGIES:  NO KNOWN DRUG ALLERGIES.   MEDICATIONS ON ADMISSION:  Aspirin, Plavix, nicotine patch, Lipitor,  meclizine, clonazepam, vitamins, Mucinex, Reglan and tramadol.   SOCIAL HISTORY:  The patient is married.  He and his wife have two  children.  The patient lives in [place] with his daughter and his  wife.  He smokes two pack cigarettes per day.  This is down from a  previous history of smoking three packs of cigarettes per day.  He had  been drinking up to one case of beer per day.  He states he has been  abstaining since his recent hospital admission for alcohol withdrawal  symptoms.  He is a retired Personnel officer.   FAMILY HISTORY:  His mother died at age 8 from natural causes.  She had  Alzheimer's dementia and diabetes.  His father died at age 51 from lower  extremity gangrene.  He also had Alzheimer's dementia and diabetes.  He  has a brother who died from cancer.  A sister who is alive although she  has Alzheimer's disease and diabetes.   LABORATORY DATA:  An INR is 1.0, PTT is 28, BUN is 6, creatinine 0.66,  potassium was  4.2, glucose 127.  His GFR was greater than 60, hemoglobin  14.9, hematocrit 44.3, WBCs 5.1, platelets 185,000.   REVIEW OF SYSTEMS:  Completely negative except for the following.  The  patient continues to smoke two packs of cigarettes per day, although he  is trying to quit.  He has diffuse arthritic complaints.  He reports  that he bruises easily.  He reports a right foot drop associated with  right dorsal numbness and tingling.  This has been present approximately  2 weeks.   PHYSICAL EXAMINATION:  GENERAL:  A pleasant 65 year old white male in no  acute distress.  VITAL SIGNS:  Blood pressure 145/68, pulse 71, respirations 20,  temperature 97.8, oxygen saturation 99% on room air.  HEENT:  Unremarkable.  NECK:  Reveals no bruits.  HEART:  Reveals regular rate and rhythm without murmur.  LUNGS:  Slightly decreased but clear.  ABDOMEN:  Soft, nontender.  EXTREMITIES:  Reveal pulses  to be intact without edema.  His airway is  rated at a 2.  His ASA scale is rated at a 3.  NEUROLOGICAL:  The patient is alert and oriented and follows commands.  Cranial nerves II-XII are grossly intact.  Sensation is intact to light  touch except for the dorsal aspect of his right lower extremity.  Motor  strength is 5/5 throughout, except for some mild weakness of the right  ankle dorsiflexion.  Cerebellar testing was intact with some mild  difficulty performing finger-to-nose testing on the right.   IMPRESSION:  1. Cerebrovascular disease with a history of transient ischemic      attacks.  2. Previous right internal carotid artery stent performed March 10, 2008 for a 90% stenosis.  3. Repeat cerebral angiogram performed July 02, 2008 revealing a 70%      in-stent stenosis as well as a 50% left internal carotid artery      stenosis.  4. History of COPD with ongoing tobacco use.  5. History of alcohol abuse with recent withdrawal symptoms and      subsequent abstinence.  6. History of hyperlipidemia.  7. Anxiety, question depression.  8. Arthritis.  9. History of lumbar fusion and vasectomy.  10.Mild right ankle weakness and foot numbness times 2 weeks.  11.Degenerative joint disease.  12.History of vertigo.   PLAN:  As noted, the patient will have a repeat cerebral angiogram today  and possible angioplasty and/or stenting of the right internal carotid  artery in-stent stenosis.      Delton See, P.A.    ______________________________  Grandville Silos. Corliss Skains, M.D.    DR/MEDQ  D:  09/28/2008  T:  09/28/2008  Job:  [id number]   cc:   Ramon Dredge L. Juanetta Gosling, M.D.  Marlan Palau, M.D.

## 2011-04-10 NOTE — Discharge Summary (Signed)
NAME:  GREGOREY, NABOR               ACCOUNT NO.:  [id number]   MEDICAL RECORD NO.:  [id number]          PATIENT TYPE:  INP   LOCATION:  A338                          FACILITY:  [facility]   PHYSICIAN:  Edward L. Juanetta Gosling, M.D.DATE OF BIRTH:  08/07/1946   DATE OF ADMISSION:  09/17/2008  DATE OF DISCHARGE:  10/24/2009LH                               DISCHARGE SUMMARY   DISCHARGE DIAGNOSES:  1. Alcohol withdrawal syndrome.  2. Chronic obstructive pulmonary disease.  3. History of stenting of the right-side of his brain.  4. Alcoholism.  5. Hypertension.  6. Hyperlipidemia.  7. Chronic vertigo.   HISTORY:  Mr. Hidalgo is a 65 year old who came in because of  tremulousness.  His blood pressure was also quite high.  He had been  drinking 6 beers a day for 40 years and then stopped 3 days ago.  He had  also been on Klonopin to try to help with alcohol withdrawal in the past  and he has also stopped Klonopin.  He became very tremulous.  His blood  pressure was up and down and he came to the emergency room for  evaluation.  When he was seen in the emergency room, he was hypertensive  and tachycardic appeared somewhat anxious.  His chest was clear.  Heart  regular.  Abdomen, soft.  Chest x-ray showed no acute changes.  Magnesium is 1.7.  Electrolytes were normal.  SGOT was 92.  SGPT was 98.  He had some elevation of his liver functions.  Alcohol was less than 5.   ASSESSMENT:  Assessment at the time of admission is that he had what  appeared to be alcohol withdrawal syndrome.  He was started on magnesium  and fluids given vitamin etc and improved markedly.  By the time of  discharge, which was about 24 hours he was much improved and he is going  to go home on Lipitor 10 mg, Relafen 750 mg 2 daily, tramadol 50 mg q.4  h. as needed, meclizine 25 mg as needed, Plavix 75 mg daily, Klonopin  0.5 b.i.d., Reglan as needed 10 mg up to q.i.d., and aspirin 325 mg  daily.  He is going to be on a prenatal  multiple vitamin, and I have  advised him to take his Klonopin regularly for at least the next week  and then he can try to taper himself off.      Edward L. Juanetta Gosling, M.D.  Electronically Signed     ELH/MEDQ  D:  09/18/2008  T:  09/19/2008  Job:  [id number]

## 2011-04-10 NOTE — Discharge Summary (Signed)
Roy [name], Roy [name] NO.:  [id number]   MEDICAL RECORD NO.:  [id number]          PATIENT TYPE:  INP   LOCATION:  3109                         FACILITY:  [facility]   PHYSICIAN:  Delton See, P.A.   DATE OF BIRTH:  August 06, 1946   DATE OF ADMISSION:  03/10/2008  DATE OF DISCHARGE:  03/11/2008                               DISCHARGE SUMMARY   CHIEF COMPLAINT:  Cerebrovascular disease.   HISTORY OF PRESENT ILLNESS:  This is a pleasant 65 year old male, who  was referred to Dr. Corliss Skains through the courtesy of Dr. Anne Hahn for  evaluation of dizziness.  It was felt that the patient may have  experienced a TIA.  An arteriogram was performed by Dr. Corliss Skains on  February 18, 2008, that showed a 90% blockage of the right internal carotid  artery.   Dr. Corliss Skains met with the patient and his family on February 20, 2008.  At  that time, the PTA stenting procedure was described in detail along with  the risks and benefits.  The patient elected to proceed with the  intervention.  He was admitted to [facility] on March 10, 2008,  for that purpose.   PAST MEDICAL HISTORY:  1. Vertigo.  2. Hyperlipidemia.  3. History of ongoing tobacco use up to 2 packs per day.  4. COPD.  5. Degenerative joint disease.  6. Possible TIAs.  7. History of anxiety.  8. History of alcohol use.  The patient admits to drinking one-half to      one case of beer per day.   SURGICAL HISTORY:  The patient had a lumbar fusion performed by Dr.  Franky Macho.  He had a vasectomy.  He denies any previous problems with  anesthesia.   MEDICATIONS AT THE TIME OF ADMISSION:  1. Lipitor.  2. Antivert.  3. Klonopin.  4. Relafen.  5. Vitamins and supplements.  6. Aspirin.  7. He was also recently started on Plavix in anticipation of the stent      placement.   SOCIAL HISTORY:  The patient is married.  He has 2 children.  He lives  in [place] with his daughter and wife.  He smokes 2 packs of  cigarettes per day.  He drinks one-half to one case of beer per day.  He  is retired and unemployed.   FAMILY HISTORY:  His mother died at the age 60 from natural causes.  She  had Alzheimer's dementia as well as diabetes.  His father died at the  age 46 from gangrene.  He also had Alzheimer's dementia and diabetes.  He has one brother who died from cancer.  He has one sister who is  alive, although she has Alzheimer's and diabetes.   HOSPITAL COURSE:  As noted, this patient was admitted to [facility] on March 10, 2008, with a known 90% right internal carotid  artery stenosis, which was felt to be symptomatic.  The plan was for PTA  stenting.  This was performed under general anesthesia by Dr. Corliss Skains  on the day of admission.  The patient tolerated the  procedure well.  Following the intervention, he was admitted to the neuro intensive care  unit, where he remained on IV heparin overnight.   The following day, the IV heparin was discontinued.  The right femoral  groin sheath was removed, and hemostasis was obtained.  The patient is  currently on bedrest.  He will need 6 hours of bedrest following removal  of the sheath.  After that, he will be ambulated.  If he remains  medically stable, we will proceed with discharge.   The patient did complain of back pain while on bedrest.  He does have a  history of a previous lumbar fusion and finds difficulty lying on his  back for any significant length of time.  He did require some pain  medication for his back pain.   The patient was also believed to be having some alcohol withdrawal  symptoms.  He required medication to suppress these symptoms as well.   The patient had no significant laboratory abnormalities except for a  mild decrease in his platelet count, which will be followed up on an  outpatient basis.   LABORATORY DATA:  A CBC on the day of discharge revealed hemoglobin  12.6, hematocrit 35.6, WBC 6200, platelets  86,000.  On March 05, 2008,  his platelet count had been 125,000, hemoglobin had been 15.6, and  hematocrit 44.5.  A basic metabolic panel on the day of discharge  revealed a BUN of 4, creatinine 0.43, potassium was 3.5, glucose was  132.  His GFR was greater than 60.   DISCHARGE MEDICATIONS:  The patient was told to stay on the medications  that he was taking previously, including his aspirin and Plavix.  He was  told to resume his previous diet.  He was given more 2 weeks.  He was  told to try to quit smoking and to avoid alcohol.  A followup cerebral  angiogram will be performed in about 3 months.  The patient was told to  follow up with his primary care physician, Dr. Juanetta Gosling, within a week or  two to have his platelet count rechecked.  He will see Dr. Corliss Skains in  approximately 2 months.   1. Severe right internal carotid artery stenosis with symptoms.  2. Long history of dizziness with possible transient ischemic attacks.  3. Status post percutaneous transluminal angioplasty stenting of the      right internal carotid artery performed on March 10, 2008, by Dr.      Corliss Skains under general anesthesia.  4. Hyperlipidemia.  5. History of significant alcohol use with suspected alcohol      withdrawal symptoms during this admission.  6. Ongoing tobacco use up to 2 packs of cigarettes per day.  7. Chronic obstructive pulmonary disease.  8. Degenerative joint disease.  9. History of anxiety.  10.Mildly decreased platelet count with followup recommended.      Delton See, P.A.     DR/MEDQ  D:  03/11/2008  T:  03/12/2008  Job:  [id number]   cc:   Ramon Dredge L. Juanetta Gosling, M.D.  Marlan Palau, M.D.

## 2011-04-10 NOTE — Discharge Summary (Signed)
NAME:  Roy [name], Roy [name]               ACCOUNT NO.:  [id number]   MEDICAL RECORD NO.:  [id number]          PATIENT TYPE:  INP   LOCATION:  3113                         FACILITY:  [facility]   PHYSICIAN:  Sanjeev K. Deveshwar, M.D.DATE OF BIRTH:  September 20, 1946   DATE OF ADMISSION:  09/28/2008  DATE OF DISCHARGE:  10/07/2008                               DISCHARGE SUMMARY   CHIEF COMPLAINT:  Status post angioplasty of a right internal carotid  artery stenosis within a previously placed stent performed on September 28, 2008.   HISTORY OF PRESENT ILLNESS:  This is a very pleasant 65 year old male  referred to Dr. Corliss Skains through the courtesy of Dr. Lesia Sago for  evaluation of cerebrovascular disease.  The patient has a history of  TIAs.  He underwent stent-assisted angioplasty of a right internal  carotid artery stenosis on March 10, 2008.  The patient did well,  however, a followup angiogram performed on July 02, 2008 showed a 70%  stenosis within the stent as well as a 50% left internal carotid artery  stenosis.  Arrangements were made to have the patient return to [facility] on September 28, 2008 for further treatment of the right  internal carotid artery.   PAST MEDICAL HISTORY:  The patient has a history of a right internal  carotid artery stent-assisted angioplasty performed on March 10, 2008  after presenting with TIA symptoms.  He has a history of alcohol abuse  and was recently admitted to [facility], September 17, 2008 to  September 18, 2008 for withdrawal symptoms.  He states he has been alcohol-  free since that time.  He has a history of ongoing tobacco use with  probable COPD.  He has a history of hyperlipidemia, anxiety, previous  thrombocytopenia, and hypertension.   SURGICAL HISTORY:  Significant for a lumbar fusion and a vasectomy.  His  neurosurgeon in the past has been Dr. Franky Macho.  The patient denies any  previous problems with anesthesia.   ALLERGIES:   No known drug allergies.   MEDICATIONS ON ADMISSION:  Aspirin, Plavix, nicotine patches, Lipitor,  meclizine, clonazepam, vitamins, Mucinex, Reglan, and tramadol.   SOCIAL HISTORY:  The patient is married.  He and his wife have 2  children.  The patient lives in [place] with his wife and daughter.  He smokes 2 packs of cigarettes per day, this is down from a previous  history of smoking 3 packs of cigarettes per day.  He had been drinking  up to 1 case of beer per day.  He states he has been abstaining since  his recent [facility] admission.  He is a retired Personnel officer.   FAMILY HISTORY:  His mother died at age 24 from natural causes.  She had  Alzheimer dementia and diabetes.  His father died at age 62 from lower  extremity gangrene.  He also had Alzheimer dementia and diabetes.  He  has a brother who died from cancer.  A sister is who is alive, although  she has Alzheimer disease and diabetes.   HOSPITAL COURSE:  As noted,  this patient was admitted to [facility] on September 28, 2008 for further evaluation and treatment of a  stenosis within a right internal carotid artery stent that was placed on  March 10, 2008.  On the day of admission, a cerebral angiogram was  repeated after which the patient underwent an angioplasty of the in-  stent stenosis of the right internal carotid artery.  The patient was  felt to have a good result from the angioplasty, however, the procedure  was complicated by a subarachnoid hemorrhage.  Please see Dr.  Fatima Sanger complete dictation for full details.   Following the procedure, the patient was admitted to the Neurointensive  Care Unit where he was monitored closely.  He was not treated with IV  heparin as per the usual protocol.  His Plavix was also initially held.  The patient had serial CT scans of the head performed, which showed  continued improvement and a subarachnoid hemorrhage.  The patient had no  significant  neurologic deficits on physical exam and remained stable  throughout his hospital stay.   The patient was loaded with Dilantin in order to prevent possible  seizure activity.  As noted, he does have a history of alcohol abuse.  He was recently admitted to [facility] for withdrawal symptoms.  He was treated with p.r.n. Klonopin and Librium during his stay.  He was  also placed on thiamine, folate, and a multivitamin.  He had no obvious  withdrawal symptoms during this admission.   The patient's blood pressure was running mildly high.  Dr. Corliss Skains  recommended treatment.  The patient was not on any antihypertensive  medications prior to admission.  We did start him on Altace 2.5 mg  daily.  He was also placed on nimodipine 60 mg every 4 hours to prevent  possible spasm.  Again, the patient remained stable throughout his  hospital stay.  TCDs were performed and these were not consistent with  any spasm.  Dilantin levels were checked.  His trough Dilantin level on  the day of discharge was 10.5.   The patient was very anxious for discharge.  Eventually, arrangements  were made to discharge the patient in stable and improved condition on  October 07, 2008.   LABORATORY DATA:  On the day of discharge, a trough Dilantin level was  10.5.  The patient requested that we check his PSA during his stay.  This was performed on October 06, 2008 and was 1.02 within normal  limits.  Most recent basic metabolic panel on October 01, 2008 revealed  a BUN of 5, creatinine 0.63, GFR was greater than 60, glucose was 107,  and potassium was 4.3.  A CBC on October 01, 2008 revealed hemoglobin  13.1, hematocrit 37.9, WBCs 8000, and platelets 288,000.  A liver  function panel was performed on September 23, 2008.  This was within  normal limits with the exception of a total protein mildly low at 5.9.  The most recent CT scan of the head was performed on October 06, 2008  that showed continued  evolution of the subarachnoid hemorrhage on the  right.  There were no new abnormalities.   DISCHARGE MEDICATIONS:  The patient was discharged on the following  medications:  1. Lipitor 10 mg daily.  2. Aspirin 81 mg daily.  3. Plavix 75 mg daily.  4. Nimodipine 30 mg 2 tablets every 4 hours for 5 days after which      this medication will  be discontinued.  5. Nicotine patch daily as needed.  6. Thiamine 100 mg daily.  7. Folate 1 mg daily.  8. Multivitamin 1 daily.  9. Altace 2.5 mg daily.  10.Dilantin 100 mg t.i.d.   DIET:  The patient will stay on a low-fat, low-cholesterol diet.   DISCHARGE INSTRUCTIONS:  He was given instructions regarding wound care.  He was told not to do anything strenuous and not to drive until cleared  by Dr. Corliss Skains.  He was also told to avoid bending, stooping, or  extreme movements of his neck from side-to-side.   The patient will have an angiogram repeated in approximately 3-4 months.  He will have a followup CT scan of the head on Monday October 11, 2008  at 11 a.m. at [facility].  He was told to follow up with Dr.  Juanetta Gosling in 3-4 weeks to have his blood pressure checked.  He would see  Dr. Corliss Skains on Wednesday, October 20, 2008 at 3 p.m.  The patient was  told to abstain from alcohol and tobacco.  Dr. Corliss Skains made it clear  to the patient that alcohol would be poorly tolerated by the patient at  this time secondary to the current medical regimen and would put the  patient at high risk for a seizure.   DISCHARGE DIAGNOSES:  1. Cerebrovascular disease with a history of transient ischemic      attacks.  2. Stent-assisted angioplasty performed on March 10, 2008 for a 90%      right internal carotid artery stenosis.  3. Status post angioplasty of the right internal carotid artery      stenosis within the stent performed on September 28, 2008.  4. Right subarachnoid hemorrhage following the above-noted      angioplasty.  5. History  of chronic obstructive pulmonary disease with long history      of tobacco use.  6. History of alcohol abuse with withdrawal symptoms.  7. History of hyperlipidemia.  8. History of anxiety and depression.  9. Arthritis.  10.History of lumbar fusion and vasectomy.  11.Mild right ankle weakness and foot numbness x2 weeks.  12.Degenerative joint disease.  13.History of vertigo.      Delton See, P.A.    ______________________________  Grandville Silos. Corliss Skains, M.D.    DR/MEDQ  D:  10/07/2008  T:  10/08/2008  Job:  [id number]   cc:   Ramon Dredge L. Juanetta Gosling, M.D.  Marlan Palau, M.D.

## 2011-04-13 NOTE — Discharge Summary (Signed)
   NAME:  Roy [name], Roy [name]                         ACCOUNT NO.:  [id number]   MEDICAL RECORD NO.:  [id number]                   PATIENT TYPE:  INP   LOCATION:  3020                                 FACILITY:  [facility]   PHYSICIAN:  Coletta Memos, M.D.                  DATE OF BIRTH:  Aug 13, 1946   DATE OF ADMISSION:  07/23/2003  DATE OF DISCHARGE:  07/26/2003                                 DISCHARGE SUMMARY   ADMITTING DIAGNOSES:  1. 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  2. 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  3. Low back pain.   POSTOPERATIVE DIAGNOSES:  1. 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  2. 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  3. Low back pain.   PROCEDURE:  Posterior lumbar antibody fusion using Synthes cages 11 mm on  the left, 9 mm on the right.   COMPLICATIONS:  None.   DISCHARGE STATUS:  Alive and well.   INDICATIONS:  The patient is a 65 year old with severe back pain.  At L4-5  he has a degenerated disk with motor changes which were quite extensive at  L4 and L5.  Associated with this is a disk herniation at that level.  I  therefore recommend and he agreed to undergo a posterior lumbar antibody  fusion.  I spoke to him about the off-label use of the cages.  I did not  think that he would need pedicle screws.  I explained that if he does not  have a fusion in the future then pedicle screws may need to be added.  However, by sparing the facets he is not inherently unstable at this point  in time and there was no reason to believe that he will become unstable in  the future.  His wound is clean, dry without signs of infection.  The disk  strength is full.  He has tolerated a regular diet and is walking without  difficulty.  He was given Flexeril and Percocet on discharge.  He is to call  our office for return appointment in three to four weeks.  He is not to  drive for 10 days.                                                Coletta Memos, M.D.    KC/MEDQ  D:  07/26/2003  T:   07/26/2003  Job:  [id number]

## 2011-04-13 NOTE — Op Note (Signed)
NAME:  [name], BABY                         ACCOUNT NO.:  [id number]   MEDICAL RECORD NO.:  [id number]                   PATIENT TYPE:  INP   LOCATION:  3020                                 FACILITY:  [facility]   PHYSICIAN:  Coletta Memos, M.D.                  DATE OF BIRTH:  06-Jun-1946   DATE OF PROCEDURE:  07/23/2003  DATE OF DISCHARGE:                                 OPERATIVE REPORT   PREOPERATIVE DIAGNOSES:  1. Degenerative disk disease, L4-5.  2. Low back pain.   POSTOPERATIVE DIAGNOSES:  1. Degenerative disk disease, L4-5.  2. Low back pain.   PROCEDURE:  Posterior lumbar interbody fusion with P cages, 11-mm cage on  the left side and 9-mm cage on the right side.   COMPLICATIONS:  None.   SURGEON:  Coletta Memos, M.D.   ANESTHESIA:  General endotracheal anesthesia.   INDICATIONS FOR PROCEDURE:  Roy [name] is a patient of mine whom I have  taken care for his cervical spine. He presented with severe pain in his  lower  back. An MRI  showed a severely degenerated disk  at L4-5 with modic  changes present at that level. He  had a central disk bulge also. He had no  lower extremity pain. I therefore recommended and he agreed to under  a  posterior  lumbar interbody fusion knowing that this was an off label use of  the P cages. I explained my rationale and  he agreed to the procedure.   DESCRIPTION OF PROCEDURE:  Mr. Welles Walthall is a 65 year old gentleman who  was brought to the operating room, intubated and placed under general  anesthesia without difficulty. He was placed on body rolls and his back was  then prepped and he was draped in a sterile fashion. Using a preoperative  localizing film, I infiltrated 20 mL of 0.5% Lidocaine with 1:200,000  epinephrine into the lumbar region. The patient was then prepped and draped  in a sterile fashion.   The skin was opened with a #10 blade and I took this down to the  thoracolumbar fascia sharply. I then exposed the  lamina of L4 and L5  bilaterally and x-ray  showed I  was in the correct  intralaminar  space. I  then proceeded with semi hemilaminectomies of L4 and L5 bilaterally  using  both high-speed air drill and Kerrison punches.   After removing the ligamentum Flavum, I identified  the  thecal sac,  retracted  that medially  and then performed  very aggressive diskectomies  bilaterally. The space was quite spondylitic, especially on the right side.  I then prepared  the endplates for implantation of  the P cages.   I first placed a 9-mm P cage on the right side and it had a nice snug fit.  That was packed  with DBX and Chronos bone substitute. I then placed a  9-mm  cage on the right side and in the process the 9-mm cage on the left side  became loose. I then decided that I would place an 11-mm cage on the left  side and I did that without difficulty. The cage on the right side remained  quite  snug. A postoperative x-ray  showed both cages to be  in good  position. The left-sided cage is more posterior, but there is absolutely no  and it is level  with the endplates. More bone was then packed into  the  interspace which was morselized allograft.   I then  irrigated  the wound. I then closed the wound in a layered fashion  using Vicryl sutures. Dermabond was used for a sterile dressing.                                               Coletta Memos, M.D.    KC/MEDQ  D:  07/23/2003  T:  07/24/2003  Job:  [id number]

## 2011-05-05 ENCOUNTER — Other Ambulatory Visit: Payer: Self-pay | Admitting: Adult Health

## 2011-05-07 ENCOUNTER — Other Ambulatory Visit: Payer: Self-pay

## 2011-05-07 MED ORDER — ATORVASTATIN CALCIUM 20 MG PO TABS: 20.0000 mg | ORAL_TABLET | Freq: Every day | ORAL | Status: DC

## 2011-05-10 ENCOUNTER — Other Ambulatory Visit: Payer: Self-pay | Admitting: Cardiology

## 2011-05-11 LAB — BASIC METABOLIC PANEL
Calcium: 9.5 mg/dL (ref 8.4–10.5)
Potassium: 4.9 mEq/L (ref 3.5–5.3)
Sodium: 135 mEq/L (ref 135–145)

## 2011-08-15 LAB — LIPASE, BLOOD: Lipase: 43

## 2011-08-15 LAB — COMPREHENSIVE METABOLIC PANEL
Albumin: 3.9
Alkaline Phosphatase: 93
BUN: 8
CO2: 26
Chloride: 97
Creatinine, Ser: 0.56
GFR calc non Af Amer: >60
Glucose, Bld: 164 — ABNORMAL HIGH
Potassium: 4.3
Total Bilirubin: 0.5

## 2011-08-15 LAB — DIFFERENTIAL
Basophils Absolute: 0
Basophils Relative: 0
Lymphocytes Relative: 10 — ABNORMAL LOW
Monocytes Absolute: 0.4
Neutro Abs: 4.8
Neutrophils Relative %: 83 — ABNORMAL HIGH

## 2011-08-15 LAB — POCT CARDIAC MARKERS
CKMB, poc: 1.2
Myoglobin, poc: 36.7

## 2011-08-15 LAB — CBC
HCT: 43.7
Hemoglobin: 15
MCV: 93.3
WBC: 5.9

## 2011-08-20 LAB — BASIC METABOLIC PANEL
BUN: 7
Calcium: 8.8
GFR calc non Af Amer: >60
Glucose, Bld: 88

## 2011-08-20 LAB — CBC
HCT: 44.6
Platelets: 128 — ABNORMAL LOW
RDW: 13.7

## 2011-08-20 LAB — APTT: aPTT: 26

## 2011-08-20 LAB — PROTIME-INR: INR: 0.9

## 2011-08-21 LAB — DIFFERENTIAL
Basophils Absolute: 0.1
Basophils Relative: 1
Eosinophils Absolute: 0.3
Lymphs Abs: 1.2
Neutrophils Relative %: 67

## 2011-08-21 LAB — BASIC METABOLIC PANEL
BUN: 6
BUN: 4 — ABNORMAL LOW
CO2: 27
Calcium: 7.7 — ABNORMAL LOW
Chloride: 104
Chloride: 102
Creatinine, Ser: 0.5
Creatinine, Ser: 0.43
GFR calc Af Amer: >60
Glucose, Bld: 81
Glucose, Bld: 132 — ABNORMAL HIGH

## 2011-08-21 LAB — CBC
MCHC: 35.4
MCV: 95.3
MCV: 95.5
Platelets: 125 — ABNORMAL LOW
Platelets: 86 — ABNORMAL LOW
RDW: 13.9
RDW: 13.9
WBC: 6.4

## 2011-08-21 LAB — PROTIME-INR
INR: 0.9
Prothrombin Time: 12.3

## 2011-08-21 LAB — APTT: aPTT: 52 — ABNORMAL HIGH

## 2011-08-24 LAB — CBC
MCHC: 33.9
MCV: 92.1
Platelets: 213
RBC: 4.86
WBC: 7.1

## 2011-08-24 LAB — PROTIME-INR
INR: 1
Prothrombin Time: 12.9

## 2011-08-24 LAB — BASIC METABOLIC PANEL
BUN: 12
Calcium: 9.4
Creatinine, Ser: 0.62
GFR calc Af Amer: >60

## 2011-08-27 LAB — DIFFERENTIAL
Basophils Absolute: 0
Basophils Absolute: 0
Basophils Relative: 1
Basophils Relative: 1
Eosinophils Absolute: 0
Eosinophils Absolute: 0.2
Eosinophils Relative: 1
Eosinophils Relative: 3
Lymphocytes Relative: 16
Monocytes Absolute: 0.6
Monocytes Absolute: 0.8
Neutro Abs: 3.1

## 2011-08-27 LAB — BASIC METABOLIC PANEL
CO2: 29
Calcium: 9.5
Glucose, Bld: 127 — ABNORMAL HIGH
Sodium: 139

## 2011-08-27 LAB — COMPREHENSIVE METABOLIC PANEL
ALT: 98 — ABNORMAL HIGH
AST: 92 — ABNORMAL HIGH
Albumin: 3.5
Alkaline Phosphatase: 91
CO2: 26
Chloride: 95 — ABNORMAL LOW
GFR calc Af Amer: >60
GFR calc non Af Amer: >60
Potassium: 3.6
Sodium: 134 — ABNORMAL LOW
Total Bilirubin: 1.1

## 2011-08-27 LAB — HEPATIC FUNCTION PANEL
ALT: 39
Alkaline Phosphatase: 83
Bilirubin, Direct: 0.1
Indirect Bilirubin: 0.8
Total Bilirubin: 0.9

## 2011-08-27 LAB — ETHANOL: Alcohol, Ethyl (B): <5

## 2011-08-27 LAB — URINALYSIS, ROUTINE W REFLEX MICROSCOPIC
Glucose, UA: NEGATIVE
Hgb urine dipstick: NEGATIVE
Protein, ur: NEGATIVE
Urobilinogen, UA: 0.2

## 2011-08-27 LAB — CBC
HCT: 44.3
Hemoglobin: 14.9
MCHC: 33.6
MCV: 93.7
Platelets: 68 — ABNORMAL LOW
RBC: 4.75
RDW: 14.7
WBC: 3.7 — ABNORMAL LOW

## 2011-08-27 LAB — RAPID URINE DRUG SCREEN, HOSP PERFORMED
Amphetamines: NOT DETECTED
Barbiturates: NOT DETECTED
Benzodiazepines: NOT DETECTED

## 2011-08-28 LAB — COMPREHENSIVE METABOLIC PANEL
Albumin: 3.2 — ABNORMAL LOW
Alkaline Phosphatase: 79
BUN: 6
CO2: 25
Chloride: 107
Creatinine, Ser: 0.52
GFR calc non Af Amer: >60
Glucose, Bld: 97
Total Bilirubin: 0.7

## 2011-08-28 LAB — PHENYTOIN LEVEL, TOTAL
Phenytoin Lvl: 5.1 — ABNORMAL LOW
Phenytoin Lvl: 9.5 — ABNORMAL LOW
Phenytoin Lvl: 9.7 — ABNORMAL LOW

## 2011-08-28 LAB — BASIC METABOLIC PANEL
CO2: 25
CO2: 26
Calcium: 8.5
Chloride: 108
GFR calc non Af Amer: >60
Glucose, Bld: 107 — ABNORMAL HIGH
Glucose, Bld: 98
Potassium: 3.5
Sodium: 138
Sodium: 141

## 2011-08-28 LAB — CBC
HCT: 36.1 — ABNORMAL LOW
HCT: 37.9 — ABNORMAL LOW
Hemoglobin: 12.3 — ABNORMAL LOW
MCHC: 34
MCHC: 34.5
MCV: 95
RBC: 3.8 — ABNORMAL LOW
RBC: 3.99 — ABNORMAL LOW
RDW: 14.1

## 2011-08-28 LAB — PSA: PSA: 1.02

## 2011-11-02 ENCOUNTER — Other Ambulatory Visit (HOSPITAL_COMMUNITY): Payer: Self-pay | Admitting: Interventional Radiology

## 2011-11-02 DIAGNOSIS — Z09 Encounter for follow-up examination after completed treatment for conditions other than malignant neoplasm: Secondary | ICD-10-CM

## 2011-11-06 ENCOUNTER — Inpatient Hospital Stay (HOSPITAL_COMMUNITY): Admission: RE | Admit: 2011-11-06 | Source: Ambulatory Visit

## 2011-12-26 ENCOUNTER — Other Ambulatory Visit (HOSPITAL_COMMUNITY): Payer: Self-pay | Admitting: Interventional Radiology

## 2011-12-26 DIAGNOSIS — I771 Stricture of artery: Secondary | ICD-10-CM

## 2011-12-26 DIAGNOSIS — Z09 Encounter for follow-up examination after completed treatment for conditions other than malignant neoplasm: Secondary | ICD-10-CM

## 2012-01-03 ENCOUNTER — Encounter (HOSPITAL_COMMUNITY): Payer: Self-pay | Admitting: Pharmacy Technician

## 2012-01-08 ENCOUNTER — Other Ambulatory Visit: Payer: Self-pay | Admitting: Physician Assistant

## 2012-01-08 DIAGNOSIS — Z125 Encounter for screening for malignant neoplasm of prostate: Secondary | ICD-10-CM | POA: Diagnosis not present

## 2012-01-08 DIAGNOSIS — I6789 Other cerebrovascular disease: Secondary | ICD-10-CM | POA: Diagnosis not present

## 2012-01-08 DIAGNOSIS — I10 Essential (primary) hypertension: Secondary | ICD-10-CM | POA: Diagnosis not present

## 2012-01-08 DIAGNOSIS — F329 Major depressive disorder, single episode, unspecified: Secondary | ICD-10-CM | POA: Diagnosis not present

## 2012-01-08 DIAGNOSIS — F411 Generalized anxiety disorder: Secondary | ICD-10-CM | POA: Diagnosis not present

## 2012-01-08 LAB — LIPID PANEL
ALT: 74 U/L — AB (ref 10–40)
Albumin: 4.3
Bilirubin, Direct: 0.2 mg/dL (ref 0.01–0.4)
PSA: 1.37 ng/mL
Total Protein: 7 g/dL

## 2012-01-15 ENCOUNTER — Other Ambulatory Visit (HOSPITAL_COMMUNITY): Payer: Self-pay | Admitting: Interventional Radiology

## 2012-01-15 ENCOUNTER — Ambulatory Visit (HOSPITAL_COMMUNITY)
Admission: RE | Admit: 2012-01-15 | Discharge: 2012-01-15 | Disposition: A | Discharge status: Home / Self Care | Payer: Medicare Other | Source: Ambulatory Visit | Attending: Interventional Radiology | Admitting: Interventional Radiology

## 2012-01-15 ENCOUNTER — Encounter (HOSPITAL_COMMUNITY): Payer: Self-pay

## 2012-01-15 DIAGNOSIS — F3289 Other specified depressive episodes: Secondary | ICD-10-CM | POA: Insufficient documentation

## 2012-01-15 DIAGNOSIS — M19019 Primary osteoarthritis, unspecified shoulder: Secondary | ICD-10-CM | POA: Insufficient documentation

## 2012-01-15 DIAGNOSIS — F411 Generalized anxiety disorder: Secondary | ICD-10-CM | POA: Insufficient documentation

## 2012-01-15 DIAGNOSIS — Z9889 Other specified postprocedural states: Secondary | ICD-10-CM | POA: Insufficient documentation

## 2012-01-15 DIAGNOSIS — I10 Essential (primary) hypertension: Secondary | ICD-10-CM | POA: Insufficient documentation

## 2012-01-15 DIAGNOSIS — Z09 Encounter for follow-up examination after completed treatment for conditions other than malignant neoplasm: Secondary | ICD-10-CM

## 2012-01-15 DIAGNOSIS — I771 Stricture of artery: Secondary | ICD-10-CM

## 2012-01-15 DIAGNOSIS — I447 Left bundle-branch block, unspecified: Secondary | ICD-10-CM | POA: Insufficient documentation

## 2012-01-15 DIAGNOSIS — F1010 Alcohol abuse, uncomplicated: Secondary | ICD-10-CM | POA: Insufficient documentation

## 2012-01-15 DIAGNOSIS — J4489 Other specified chronic obstructive pulmonary disease: Secondary | ICD-10-CM | POA: Insufficient documentation

## 2012-01-15 DIAGNOSIS — F329 Major depressive disorder, single episode, unspecified: Secondary | ICD-10-CM | POA: Insufficient documentation

## 2012-01-15 DIAGNOSIS — F17200 Nicotine dependence, unspecified, uncomplicated: Secondary | ICD-10-CM | POA: Insufficient documentation

## 2012-01-15 DIAGNOSIS — I672 Cerebral atherosclerosis: Secondary | ICD-10-CM | POA: Diagnosis not present

## 2012-01-15 DIAGNOSIS — E785 Hyperlipidemia, unspecified: Secondary | ICD-10-CM | POA: Insufficient documentation

## 2012-01-15 DIAGNOSIS — D696 Thrombocytopenia, unspecified: Secondary | ICD-10-CM | POA: Insufficient documentation

## 2012-01-15 DIAGNOSIS — I658 Occlusion and stenosis of other precerebral arteries: Secondary | ICD-10-CM | POA: Diagnosis not present

## 2012-01-15 DIAGNOSIS — J449 Chronic obstructive pulmonary disease, unspecified: Secondary | ICD-10-CM | POA: Insufficient documentation

## 2012-01-15 DIAGNOSIS — I6509 Occlusion and stenosis of unspecified vertebral artery: Secondary | ICD-10-CM | POA: Diagnosis not present

## 2012-01-15 LAB — CBC
HCT: 39.8 % (ref 39.0–52.0)
MCH: 32.2 pg (ref 26.0–34.0)
MCHC: 35.7 g/dL (ref 30.0–36.0)
MCV: 90.2 fL (ref 78.0–100.0)
RDW: 14 % (ref 11.5–15.5)
WBC: 5.5 10*3/uL (ref 4.0–10.5)

## 2012-01-15 LAB — BASIC METABOLIC PANEL
BUN: 5 mg/dL — ABNORMAL LOW (ref 6–23)
CO2: 22 mEq/L (ref 19–32)
Chloride: 102 mEq/L (ref 96–112)
Creatinine, Ser: 0.41 mg/dL — ABNORMAL LOW (ref 0.50–1.35)
Glucose, Bld: 104 mg/dL — ABNORMAL HIGH (ref 70–99)

## 2012-01-15 MED ORDER — IOHEXOL 300 MG/ML  SOLN
150.0000 mL | Freq: Once | INTRAMUSCULAR | Status: AC | PRN
  Administered 2012-01-15: 75 mL via INTRA_ARTERIAL

## 2012-01-15 MED ORDER — SODIUM CHLORIDE 0.9 % IV SOLN: INTRAVENOUS | Status: DC

## 2012-01-15 MED ORDER — FENTANYL CITRATE 0.05 MG/ML IJ SOLN
INTRAMUSCULAR | Status: AC | PRN
  Administered 2012-01-15 (×2): 25 ug via INTRAVENOUS

## 2012-01-15 MED ORDER — FENTANYL CITRATE 0.05 MG/ML IJ SOLN
INTRAMUSCULAR | Status: AC
  Filled 2012-01-15: qty 4

## 2012-01-15 MED ORDER — SODIUM CHLORIDE 0.9 % IV SOLN: INTRAVENOUS | Status: AC

## 2012-01-15 MED ORDER — MIDAZOLAM HCL 2 MG/2ML IJ SOLN
INTRAMUSCULAR | Status: AC
  Filled 2012-01-15: qty 4

## 2012-01-15 MED ORDER — MIDAZOLAM HCL 5 MG/5ML IJ SOLN
INTRAMUSCULAR | Status: AC | PRN
  Administered 2012-01-15: 1 mg via INTRAVENOUS

## 2012-01-15 MED ORDER — HEPARIN SOD (PORK) LOCK FLUSH 100 UNIT/ML IV SOLN
500.0000 [IU] | Freq: Once | INTRAVENOUS | Status: AC
  Administered 2012-01-15: 500 [IU] via INTRAVENOUS

## 2012-01-15 NOTE — H&P (Signed)
Roy [name] is an 66 y.o. male.    Chief Complaint: previous Right Internal Carotid angioplasty/stent. Original intervention 4/09; restenosis of stent with PTA performed 11/09 which was complicated by [facility]. Pt was admitted and stabilized; Discharged 12 days later.  HPI: scheduled for re check/evaluation. Continues to smoke daily; no new symptoms  Past Medical History  Diagnosis Date  . Cerebrovascular disease 2009    TIA; 2009- right ICA stent; re-intervention for restenosis complicated by [facility] w/o sx  . Degenerative joint disease     Total shoulder arthroplasty-right  . LBBB (left bundle branch block)     Normal echo-2011; stress nuclear in 09/2010--septal hypoperfusion representing nontransmural infarction or the effect of left bundle branch block, no ischemia  . Hyperlipidemia   . Hypertension   . Chronic obstructive pulmonary disease   . Tobacco abuse     -100 pack years; 1.5 packs per day  . Anxiety and depression   . Alcohol abuse     6 beers per day; hospital admission in 2009 for withdrawal symptoms  . Thrombocytopenia     Past Surgical History  Procedure Date  . Lumbar fusion 2010  . Vasectomy 1971  . Colonoscopy w/ polypectomy 2011  . Total shoulder arthroplasty 2011    Right-Dr. Ave Filter    No family history on file. Social History:  reports that he has been smoking.  He has never used smokeless tobacco. He reports that he does not drink alcohol or use illicit drugs.  Allergies: No Known Allergies  Medications Prior to Admission  Medication Sig Dispense Refill  . amLODipine-benazepril (LOTREL) 5-20 MG per capsule Take 1 capsule by mouth daily.      [name] aspirin 81 MG chewable tablet Chew 81 mg by mouth daily.      [name] atorvastatin (LIPITOR) 20 MG tablet Take 20 mg by mouth daily.      . clonazePAM (KLONOPIN) 1 MG tablet Take 1 mg by mouth 2 (two) times daily.       . clopidogrel (PLAVIX) 75 MG tablet Take 75 mg by mouth daily.        . Coenzyme Q10 (COQ10) 200  MG CAPS Take 2 capsules by mouth daily.       . folic acid (FOLVITE) 800 MCG tablet Take 400 mcg by mouth daily.       . Multiple Vitamins-Minerals (CENTRUM SILVER PO) Take 1 tablet by mouth daily.        [name] POTASSIUM GLUCONATE PO Take 1 tablet by mouth daily.      . Prenatal MV-Min-Fe Fum-FA-DHA (PRENATAL 1 PO) Take 1 tablet by mouth daily.      . Thiamine HCl (VITAMIN B-1) 250 MG tablet Take 125 mg by mouth 2 (two) times daily.      . vitamin B-12 (CYANOCOBALAMIN) 1000 MCG tablet Take 1,000 mcg by mouth daily.        . vitamin C (ASCORBIC ACID) 500 MG tablet Take 500 mg by mouth daily.         Medications Prior to Admission  Medication Dose Route Frequency Provider Last Rate Last Dose  . 0.9 %  sodium chloride infusion   Intravenous Continuous Oneal Grout, MD        Results for orders placed during the hospital encounter of 01/15/12 (from the past 48 hour(s))  APTT     Status: Normal   Collection Time   01/15/12  6:41 AM      Component Value Range Comment  aPTT 28  24 - 37 (seconds)   CBC     Status: Abnormal   Collection Time   01/15/12  6:41 AM      Component Value Range Comment   WBC 5.5  4.0 - 10.5 (K/uL)    RBC 4.41  4.22 - 5.81 (MIL/uL)    Hemoglobin 14.2  13.0 - 17.0 (g/dL)    HCT 40.9  81.1 - 91.4 (%)    MCV 90.2  78.0 - 100.0 (fL)    MCH 32.2  26.0 - 34.0 (pg)    MCHC 35.7  30.0 - 36.0 (g/dL)    RDW 78.2  95.6 - 21.3 (%)    Platelets 143 (*) 150 - 400 (K/uL)   PROTIME-INR     Status: Normal   Collection Time   01/15/12  6:41 AM      Component Value Range Comment   Prothrombin Time 12.8  11.6 - 15.2 (seconds)    INR 0.94  0.00 - 1.49     No results found.  Review of Systems  Constitutional: Negative for fever.  Respiratory: Negative for cough.        Post nasal drainage  Cardiovascular: Negative for chest pain.  Gastrointestinal: Negative for nausea, vomiting and abdominal pain.    Blood pressure 168/91, pulse 103, temperature 97.1 F (36.2 C),  temperature source Oral, resp. rate 18, height 5\' 5"  (1.651 m), weight 158 lb (71.668 kg), SpO2 97.00%. Physical Exam  Constitutional: He is oriented to person, place, and time. He appears well-developed and well-nourished.  HENT:  Head: Normocephalic.  Eyes: EOM are normal.  Neck: Normal range of motion.  Cardiovascular: Normal rate and regular rhythm.   No murmur heard. Respiratory: Effort normal and breath sounds normal. He has no wheezes.  GI: Soft. Bowel sounds are normal. There is no tenderness.  Musculoskeletal: Normal range of motion. He exhibits no edema.  Neurological: He is alert and oriented to person, place, and time. No cranial nerve deficit. Coordination normal.  Skin: Skin is warm and dry.     Assessment/Plan Previous R ICA stenosis; PTA/stent: 4/09;  Restenosis 11/09 PTA--[facility]/hospitalized x 12 days- stable at dc. Re check/eval today No new sxs Pt aware of procedure benefits and risks and agreeable to proceed. Consent signed.  Kesha Hurrell A 01/15/2012, 7:31 AM

## 2012-01-15 NOTE — Discharge Instructions (Signed)

## 2012-01-15 NOTE — ED Notes (Signed)
Pt accompanied today by his daughter, Cordelia Pen.

## 2012-01-15 NOTE — ED Notes (Signed)
Will be transported to Short Stay to finish recovering for DC at 1400 pending no complications

## 2012-01-15 NOTE — Procedures (Signed)
S/P 4 vessel cerebral arteriogram Rt CFA approach. Preliminary findings. 1.50 to 60 % stenosis of RT ICA cav seg . 2.Occluded RT Vert distally. 3.50 % stenosis of RT PCA prox

## 2012-01-16 ENCOUNTER — Telehealth (HOSPITAL_COMMUNITY): Payer: Self-pay

## 2012-04-16 DIAGNOSIS — I6789 Other cerebrovascular disease: Secondary | ICD-10-CM | POA: Diagnosis not present

## 2012-04-16 DIAGNOSIS — E785 Hyperlipidemia, unspecified: Secondary | ICD-10-CM | POA: Diagnosis not present

## 2012-04-16 DIAGNOSIS — I10 Essential (primary) hypertension: Secondary | ICD-10-CM | POA: Diagnosis not present

## 2012-04-16 DIAGNOSIS — R413 Other amnesia: Secondary | ICD-10-CM | POA: Diagnosis not present

## 2012-04-16 LAB — BASIC METABOLIC PANEL
ALT: 61 U/L — AB (ref 10–40)
BUN: 6 mg/dL (ref 4–21)
Bilirubin, Direct: 0.2 mg/dL (ref 0.01–0.4)
Chloride: 99 mmol/L
Creat: 0.5
TSH: 1.02 u[IU]/mL (ref 0.41–5.90)
Vitamin B12 Bind Capacity: 1341

## 2012-04-16 LAB — CBC WITH DIFFERENTIAL/PLATELET
HCT: 43 %
WBC: 6.7
platelet count: 177

## 2012-05-07 ENCOUNTER — Encounter: Payer: Self-pay | Admitting: Gastroenterology

## 2012-05-13 ENCOUNTER — Ambulatory Visit (INDEPENDENT_AMBULATORY_CARE_PROVIDER_SITE_OTHER): Payer: Medicare Other | Admitting: Urgent Care

## 2012-05-13 ENCOUNTER — Encounter: Payer: Self-pay | Admitting: Urgent Care

## 2012-05-13 VITALS — BP 117/68 | HR 77 | Temp 98.5°F | Ht 65.0 in | Wt 147.6 lb

## 2012-05-13 DIAGNOSIS — Z720 Tobacco use: Secondary | ICD-10-CM

## 2012-05-13 DIAGNOSIS — K7010 Alcoholic hepatitis without ascites: Secondary | ICD-10-CM

## 2012-05-13 DIAGNOSIS — R7989 Other specified abnormal findings of blood chemistry: Secondary | ICD-10-CM

## 2012-05-13 DIAGNOSIS — F1010 Alcohol abuse, uncomplicated: Secondary | ICD-10-CM

## 2012-05-13 DIAGNOSIS — F17200 Nicotine dependence, unspecified, uncomplicated: Secondary | ICD-10-CM

## 2012-05-13 NOTE — Patient Instructions (Addendum)
We will call you with ultrasound results You need to stop drinking alcohol.  If you decide to do this, please call Dr Juanetta Gosling or [place] to get medication to help with withdrawal. You need to stop smoking.  Call 1-800-QUIT-NOW for help with this.

## 2012-05-13 NOTE — Assessment & Plan Note (Signed)
You need to stop smoking.  Call 1-800-QUIT-NOW for help with this.

## 2012-05-13 NOTE — Progress Notes (Signed)
Referring Provider: Fredirick Maudlin, MD Primary Care Physician:  Fredirick Maudlin, MD Primary Gastroenterologist:  Dr. Jonette Eva  Chief Complaint  Patient presents with  . Follow-up    abnormal labs   HPI:  Roy [name] is a 66 y.o. male here as a referral from Dr. Juanetta Gosling for elevated liver function tests.  ALP 140, AST 120, ALT 61 on 04/16/12.  ALP 135, AST 119, ALT 74 on 01/08/12.  Pt has hx chronic alcohol abuse.  Drinks about 12 pk beer per week or more for 55-60 yrs or more.  Denies any known hx liver disease.  Denies any hx of tattoos, blood transfusions, multiple sexual partners.  Denies jaundice, pruiritis, clay colored stools.  C/o anorexia.  Wt loss 5# in past year.   Denies any upper GI symptoms including heartburn, indigestion, nausea, vomiting, dysphagia, odynophagia or anorexia.  Denies melena or rectal bleeding.   Denies any lower GI symptoms including constipation, diarrhea.  He does not want to contemplate alcohol cessation at this point and really does not see it as a problem.   Past Medical History  Diagnosis Date  . Cerebrovascular disease 2009    TIA; 2009- right ICA stent; re-intervention for restenosis complicated by [facility] w/o sx  . Degenerative joint disease     Total shoulder arthroplasty-right  . LBBB (left bundle branch block)     Normal echo-2011; stress nuclear in 09/2010--septal hypoperfusion representing nontransmural infarction or the effect of left bundle branch block, no ischemia  . Hyperlipidemia   . Hypertension   . Chronic obstructive pulmonary disease   . Tobacco abuse     -100 pack years; 1.5 packs per day  . Anxiety and depression   . Alcohol abuse     6 beers per day; hospital admission in 2009 for withdrawal symptoms  . Thrombocytopenia   . Tubular adenoma of colon     Past Surgical History  Procedure Date  . Lumbar fusion 2010  . Vasectomy 1971  . Colonoscopy w/ polypectomy 2011  . Total shoulder arthroplasty 2011    Right-Dr.  Ave Filter  . Colonoscopy 08/22/09    Fields-(Tubular Adenoma)3-mm transverse polyp/4-mm polyp otherwise noraml/small internal hemorrhoids    Current Outpatient Prescriptions  Medication Sig Dispense Refill  . amLODipine-benazepril (LOTREL) 5-20 MG per capsule Take 1 capsule by mouth daily.      [name] aspirin 81 MG chewable tablet Chew 81 mg by mouth daily.      [name] atorvastatin (LIPITOR) 20 MG tablet Take 20 mg by mouth daily.      . clonazePAM (KLONOPIN) 1 MG tablet Take 1 mg by mouth 2 (two) times daily.       . clopidogrel (PLAVIX) 75 MG tablet Take 75 mg by mouth daily.        . Coenzyme Q10 (COQ10) 200 MG CAPS Take 2 capsules by mouth daily.       . fish oil-omega-3 fatty acids 1000 MG capsule Take 2 g by mouth daily.      . folic acid (FOLVITE) 800 MCG tablet Take 400 mcg by mouth daily.       [name] loratadine (CLARITIN) 10 MG tablet Take 10 mg by mouth daily.      . Multiple Vitamins-Minerals (CENTRUM SILVER PO) Take 1 tablet by mouth daily.       [name] POTASSIUM GLUCONATE PO Take 1 tablet by mouth daily.      . Prenatal MV-Min-Fe Fum-FA-DHA (PRENATAL 1 PO) Take 1 tablet by mouth daily.      [name]  Thiamine HCl (VITAMIN B-1) 250 MG tablet Take 125 mg by mouth 2 (two) times daily.      . vitamin B-12 (CYANOCOBALAMIN) 1000 MCG tablet Take 2,000 mcg by mouth every other day.       . vitamin C (ASCORBIC ACID) 500 MG tablet Take 500 mg by mouth daily.          Allergies as of 05/13/2012  . (No Known Allergies)    Family History  Problem Relation Age of Onset  . Prostate cancer Brother   . Coronary artery disease Mother   . Alzheimer's disease Sister     History   Social History  . Marital Status: Married    Spouse Name: [name]    Number of Children: 2  . Years of Education: [name]   Occupational History  . retired, Art gallery manager  .     Social History Main Topics  . Smoking status: Current Everyday Smoker -- 1.0 packs/day for 60 years    Types: Cigarettes  . Smokeless tobacco:  Never Used  . Alcohol Use: Yes     12 pack in a week (1/2 can of beer per day) x 60years  . Drug Use: No  . Sexually Active: Not on file   Other Topics Concern  . Not on file   Social History Narrative   Accompanied by daughter Trinda Pascal w/ wife, daughter  Review of Systems: Gen: Denies any fever, chills, sweats, anorexia, fatigue, weakness, malaise, weight loss, and sleep disorder CV: Denies chest pain, angina, palpitations, syncope, orthopnea, PND, peripheral edema, and claudication. Resp: Denies dyspnea at rest, dyspnea with exercise, cough, sputum, wheezing, coughing up blood, and pleurisy. GI: Denies vomiting blood, jaundice, and fecal incontinence.  GU : Denies urinary burning, blood in urine, urinary frequency, urinary hesitancy, nocturnal urination, and urinary incontinence. MS: Denies joint pain, limitation of movement, and swelling, stiffness, low back pain, extremity pain. Denies muscle weakness, cramps, atrophy.  Derm: Denies rash, itching, dry skin, hives, moles, warts, or unhealing ulcers.  Psych: Denies depression, anxiety, memory loss, suicidal ideation, hallucinations, paranoia, and confusion. Heme: Denies bruising, bleeding, and enlarged lymph nodes. Neuro:  Denies any headaches, dizziness, paresthesias. Endo:  Denies any problems with DM, thyroid, adrenal function.  Physical Exam: BP 117/68  Pulse 77  Temp 98.5 F (36.9 C) (Temporal)  Ht 5\' 5"  (1.651 m)  Wt 147 lb 9.6 oz (66.951 kg)  BMI 24.56 kg/m2 General:   Alert,  Well-developed, well-nourished, pleasant and cooperative in NAD.  Accompanied by his daughter today.  Head:  Normocephalic and atraumatic. Eyes:  Sclera clear, no icterus.   Conjunctiva pink. Ears:  Normal auditory acuity. Nose:  No deformity, discharge, or lesions. Mouth:  No deformity or lesions,oropharynx pink & moist. Neck:  Supple; no masses or thyromegaly. Lungs:  Clear throughout to auscultation.   No wheezes, crackles, or  rhonchi. No acute distress. Heart:  Regular rate and rhythm; no murmurs, clicks, rubs,  or gallops. Abdomen:  Normal bowel sounds.  No bruits.  Soft, non-tender and non-distended without masses, hepatosplenomegaly or hernias noted.  No guarding or rebound tenderness.   Rectal:  Deferred. Msk:  Symmetrical without gross deformities. Normal posture. Pulses:  Normal pulses noted. Extremities:  +clubbing.  No edema. Neurologic:  Alert and oriented x4;  grossly normal neurologically. Skin:  Intact without significant lesions or rashes. Lymph Nodes:  No significant cervical adenopathy. Psych:  Alert and cooperative. Normal mood and affect.

## 2012-05-13 NOTE — Assessment & Plan Note (Addendum)
Roy [name] is a pleasant 66 y.o. male with chronic alcohol abuse who presents with elevated liver function tests.  I suspect alcoholic hepatitis +/- cirrhosis as the culprit of his transaminitis. Unfortunately he does not see his alcohol abuse as a problem, he is in denial and unwilling to quit at this time.  Abdominal ultrasound You need to stop drinking alcohol.  If you decide to do this, please call Dr Juanetta Gosling or [place] to get medication to help with withdrawal. Patient will need screening colonoscopy and possible EGD to screen for varices if cirrhosis is found on ultrasound in the near future.

## 2012-05-13 NOTE — Assessment & Plan Note (Signed)
See lfts

## 2012-05-15 ENCOUNTER — Encounter: Payer: Self-pay | Admitting: Gastroenterology

## 2012-05-15 ENCOUNTER — Ambulatory Visit (HOSPITAL_COMMUNITY)
Admission: RE | Admit: 2012-05-15 | Discharge: 2012-05-15 | Disposition: A | Discharge status: Home / Self Care | Payer: Medicare Other | Source: Ambulatory Visit | Attending: Urgent Care | Admitting: Urgent Care

## 2012-05-15 ENCOUNTER — Other Ambulatory Visit: Payer: Self-pay | Admitting: Cardiology

## 2012-05-15 DIAGNOSIS — K828 Other specified diseases of gallbladder: Secondary | ICD-10-CM | POA: Diagnosis not present

## 2012-05-15 DIAGNOSIS — R748 Abnormal levels of other serum enzymes: Secondary | ICD-10-CM | POA: Diagnosis not present

## 2012-05-15 DIAGNOSIS — K838 Other specified diseases of biliary tract: Secondary | ICD-10-CM | POA: Diagnosis not present

## 2012-05-15 NOTE — Progress Notes (Signed)
Faxed to PCP

## 2012-05-15 NOTE — Progress Notes (Signed)
Quick Note:  Called and informed pt. ______ 

## 2012-05-15 NOTE — Progress Notes (Signed)
Quick Note:  Please call pt. Ultrasound shows fatty liver most likely due to alcohol. He needs to stop drinking alcohol! Will need something for withdrawals. He should speak w/ Dr Juanetta Gosling or counselor. Low fat/cholesterol diet. Needs OV in 6 weeks w/ SLF only please. NW:[id number] L, MD  ______

## 2012-06-18 ENCOUNTER — Other Ambulatory Visit: Payer: Self-pay

## 2012-06-18 ENCOUNTER — Ambulatory Visit (INDEPENDENT_AMBULATORY_CARE_PROVIDER_SITE_OTHER): Payer: Medicare Other | Admitting: Gastroenterology

## 2012-06-18 ENCOUNTER — Encounter: Payer: Self-pay | Admitting: Gastroenterology

## 2012-06-18 VITALS — BP 124/70 | HR 58 | Temp 97.4°F | Ht 65.0 in | Wt 146.6 lb

## 2012-06-18 DIAGNOSIS — K760 Fatty (change of) liver, not elsewhere classified: Secondary | ICD-10-CM

## 2012-06-18 DIAGNOSIS — F1010 Alcohol abuse, uncomplicated: Secondary | ICD-10-CM

## 2012-06-18 DIAGNOSIS — D126 Benign neoplasm of colon, unspecified: Secondary | ICD-10-CM | POA: Insufficient documentation

## 2012-06-18 NOTE — Progress Notes (Signed)
Subjective:    Patient ID: Roy [name], male    DOB: 04-May-1946, 66 y.o.   MRN: [id number]  PCP: [facility]  HPI PT SEEN BY NP. NOT INTERESTED IN ETOH OR SMOKING CESSATION. PT RE-ITERATED SAME VIEWPOINT TO ME. NO RECTAL BLEEDING. LAST LABS IN MAR 2013 NL HB, PLT. ELEVATED HFP. LAST TCS 2010: SIMPLE ADENOMA. U/S JUL 2013: FATTY LIVER. NO CIRRHOSIS. TAKES LIPITOR DAILY. BMI 24-NL  Past Medical History  Diagnosis Date  . Cerebrovascular disease 2009    TIA; 2009- right ICA stent; re-intervention for restenosis complicated by [facility] w/o sx  . Degenerative joint disease     Total shoulder arthroplasty-right  . LBBB (left bundle branch block)     Normal echo-2011; stress nuclear in 09/2010--septal hypoperfusion representing nontransmural infarction or the effect of left bundle branch block, no ischemia  . Hyperlipidemia   . Hypertension   . Chronic obstructive pulmonary disease   . Tobacco abuse     -100 pack years; 1.5 packs per day  . Anxiety and depression   . Alcohol abuse     6 beers per day; hospital admission in 2009 for withdrawal symptoms  . Thrombocytopenia   . Tubular adenoma of colon     Past Surgical History  Procedure Date  . Lumbar fusion 2010  . Vasectomy 1971  . Colonoscopy w/ polypectomy 2011  . Total shoulder arthroplasty 2011    Right-Dr. Ave Filter  . Colonoscopy 08/22/09    Madeline Pho-(Tubular Adenoma)3-mm transverse polyp/4-mm polyp otherwise noraml/small internal hemorrhoids    No Known Allergies  Current Outpatient Prescriptions  Medication Sig Dispense Refill  . amLODipine-benazepril (LOTREL) 5-20 MG per capsule Take 1 capsule by mouth daily.      [name] aspirin 81 MG chewable tablet Chew 81 mg by mouth daily.      [name] atorvastatin (LIPITOR) 20 MG tablet TAKE ONE TABLET BY MOUTH EVERY DAY    . clonazePAM (KLONOPIN) 1 MG tablet Take 1 mg by mouth 2 (two) times daily.       . clopidogrel (PLAVIX) 75 MG tablet Take 75 mg by mouth daily.        . Coenzyme Q10 (COQ10)  200 MG CAPS Take 2 capsules by mouth daily.       . fish oil-omega-3 fatty acids 1000 MG capsule Take 2 g by mouth daily.      . folic acid (FOLVITE) 800 MCG tablet Take 400 mcg by mouth daily.       [name] loratadine (CLARITIN) 10 MG tablet Take 10 mg by mouth daily.      . Multiple Vitamins-Minerals (CENTRUM SILVER PO) Take 1 tablet by mouth daily.       [name] POTASSIUM GLUCONATE PO Take 1 tablet by mouth daily.      . Prenatal MV-Min-Fe Fum-FA-DHA (PRENATAL 1 PO) Take 1 tablet by mouth daily.      . Thiamine HCl (VITAMIN B-1) 250 MG tablet Take 125 mg by mouth 2 (two) times daily.      . vitamin B-12 (CYANOCOBALAMIN) 1000 MCG tablet Take 2,000 mcg by mouth every other day.       . vitamin C (ASCORBIC ACID) 500 MG tablet Take 500 mg by mouth daily.        .             Review of Systems     Objective:   Physical Exam  Constitutional: He is oriented to person, place, and time. No distress.  HENT:  Head: Normocephalic  and atraumatic.  Mouth/Throat: Oropharynx is clear and moist. No oropharyngeal exudate.  Eyes: Pupils are equal, round, and reactive to light. No scleral icterus.  Neck: Normal range of motion. Neck supple.  Cardiovascular: Normal rate, regular rhythm and normal heart sounds.   Pulmonary/Chest: Effort normal and breath sounds normal. No respiratory distress.  Abdominal: Soft. Bowel sounds are normal. He exhibits no distension. There is no tenderness.  Musculoskeletal: Normal range of motion. He exhibits no edema.  Neurological: He is alert and oriented to person, place, and time.       NO FOCAL DEFICITS   Skin:       SPIDER ANGIOMATA ON CHEST  Psychiatric: He has a normal mood and affect.          Assessment & Plan:

## 2012-06-18 NOTE — Progress Notes (Signed)
REVIEWED.  PT NEEDS VIRAL HEP PANEL & SEROLOGIES FOR AIH. TCS 2010: SIMPLE ADENOMA. U/S: FATTY LIVER/CIRRHOSIS. NEEDS CBC. CONSIDER. EGD FOR UNINTENTIONAL WEIGHT LOSS.

## 2012-06-18 NOTE — Patient Instructions (Signed)
HAVE LABS DRAWN.  FOLLOW WITH DR. Ryane Canavan EVERY 6 MOS.  SCREENING COLONOSCOPY IN 2020.

## 2012-06-18 NOTE — Assessment & Plan Note (Signed)
LAST TCS 2010-SIMPLE ADENOMA  TCS IN 2020.

## 2012-06-18 NOTE — Progress Notes (Signed)
Pt has OV today w/ Dr Darrick Penna

## 2012-06-18 NOTE — Progress Notes (Signed)
Faxed to PCP

## 2012-06-18 NOTE — Assessment & Plan Note (Addendum)
PT NOT INTERESTED IN ETO CESSATION. AWARE OF RISKS OF CONTINUED DAILY ETOH CONSUMPTION.  COMPLETE LABS TO EVALUATE FOR OTHER CAUSES FOR HEPATITIS. NO INDICATION FOR EGD AT THIS TIME. OPV IN 6 MOS. NEED HFP/CBC/PT/INR ONCE A YEAR.

## 2012-09-02 ENCOUNTER — Other Ambulatory Visit (HOSPITAL_COMMUNITY): Payer: Self-pay | Admitting: Interventional Radiology

## 2012-09-02 ENCOUNTER — Telehealth (HOSPITAL_COMMUNITY): Payer: Self-pay

## 2012-09-02 DIAGNOSIS — I6529 Occlusion and stenosis of unspecified carotid artery: Secondary | ICD-10-CM

## 2012-09-02 NOTE — Telephone Encounter (Signed)
Spoke with Roy [name] at his new cell number [phone] in regards to his f/u angio.  He gave me the day that would work for him.

## 2012-09-10 ENCOUNTER — Other Ambulatory Visit: Payer: Self-pay | Admitting: Cardiology

## 2012-10-01 ENCOUNTER — Encounter (HOSPITAL_COMMUNITY): Payer: Self-pay

## 2012-10-02 DIAGNOSIS — E785 Hyperlipidemia, unspecified: Secondary | ICD-10-CM | POA: Diagnosis not present

## 2012-10-02 DIAGNOSIS — Z23 Encounter for immunization: Secondary | ICD-10-CM | POA: Diagnosis not present

## 2012-10-02 DIAGNOSIS — Z8679 Personal history of other diseases of the circulatory system: Secondary | ICD-10-CM | POA: Diagnosis not present

## 2012-10-03 ENCOUNTER — Other Ambulatory Visit: Payer: Self-pay | Admitting: Radiology

## 2012-10-03 DIAGNOSIS — Z23 Encounter for immunization: Secondary | ICD-10-CM | POA: Diagnosis not present

## 2012-10-03 DIAGNOSIS — E785 Hyperlipidemia, unspecified: Secondary | ICD-10-CM | POA: Diagnosis not present

## 2012-10-03 DIAGNOSIS — Z8679 Personal history of other diseases of the circulatory system: Secondary | ICD-10-CM | POA: Diagnosis not present

## 2012-10-08 ENCOUNTER — Telehealth (HOSPITAL_COMMUNITY): Payer: Self-pay | Admitting: Interventional Radiology

## 2012-10-09 ENCOUNTER — Encounter (HOSPITAL_COMMUNITY): Payer: Self-pay

## 2012-10-09 ENCOUNTER — Emergency Department (HOSPITAL_COMMUNITY): Payer: Medicare Other

## 2012-10-09 ENCOUNTER — Inpatient Hospital Stay (HOSPITAL_COMMUNITY): Payer: Medicare Other

## 2012-10-09 ENCOUNTER — Inpatient Hospital Stay (HOSPITAL_COMMUNITY)
Admission: EM | Admit: 2012-10-09 | Discharge: 2012-10-13 | DRG: 897 | Disposition: A | Discharge status: Home Health | Payer: Medicare Other | Attending: Internal Medicine | Admitting: Internal Medicine

## 2012-10-09 DIAGNOSIS — W19XXXA Unspecified fall, initial encounter: Secondary | ICD-10-CM | POA: Diagnosis present

## 2012-10-09 DIAGNOSIS — F341 Dysthymic disorder: Secondary | ICD-10-CM | POA: Diagnosis not present

## 2012-10-09 DIAGNOSIS — F10231 Alcohol dependence with withdrawal delirium: Principal | ICD-10-CM

## 2012-10-09 DIAGNOSIS — R7989 Other specified abnormal findings of blood chemistry: Secondary | ICD-10-CM

## 2012-10-09 DIAGNOSIS — F32A Depression, unspecified: Secondary | ICD-10-CM

## 2012-10-09 DIAGNOSIS — S3981XA Other specified injuries of abdomen, initial encounter: Secondary | ICD-10-CM | POA: Diagnosis not present

## 2012-10-09 DIAGNOSIS — I447 Left bundle-branch block, unspecified: Secondary | ICD-10-CM | POA: Diagnosis not present

## 2012-10-09 DIAGNOSIS — R197 Diarrhea, unspecified: Secondary | ICD-10-CM | POA: Diagnosis not present

## 2012-10-09 DIAGNOSIS — S0990XA Unspecified injury of head, initial encounter: Secondary | ICD-10-CM | POA: Diagnosis not present

## 2012-10-09 DIAGNOSIS — Z981 Arthrodesis status: Secondary | ICD-10-CM

## 2012-10-09 DIAGNOSIS — I679 Cerebrovascular disease, unspecified: Secondary | ICD-10-CM | POA: Diagnosis present

## 2012-10-09 DIAGNOSIS — R5383 Other fatigue: Secondary | ICD-10-CM | POA: Diagnosis not present

## 2012-10-09 DIAGNOSIS — J449 Chronic obstructive pulmonary disease, unspecified: Secondary | ICD-10-CM | POA: Diagnosis not present

## 2012-10-09 DIAGNOSIS — M19019 Primary osteoarthritis, unspecified shoulder: Secondary | ICD-10-CM | POA: Diagnosis not present

## 2012-10-09 DIAGNOSIS — F1020 Alcohol dependence, uncomplicated: Secondary | ICD-10-CM | POA: Diagnosis present

## 2012-10-09 DIAGNOSIS — D696 Thrombocytopenia, unspecified: Secondary | ICD-10-CM

## 2012-10-09 DIAGNOSIS — Z96619 Presence of unspecified artificial shoulder joint: Secondary | ICD-10-CM

## 2012-10-09 DIAGNOSIS — Z720 Tobacco use: Secondary | ICD-10-CM

## 2012-10-09 DIAGNOSIS — R4182 Altered mental status, unspecified: Secondary | ICD-10-CM | POA: Diagnosis not present

## 2012-10-09 DIAGNOSIS — F17200 Nicotine dependence, unspecified, uncomplicated: Secondary | ICD-10-CM | POA: Diagnosis present

## 2012-10-09 DIAGNOSIS — Z8673 Personal history of transient ischemic attack (TIA), and cerebral infarction without residual deficits: Secondary | ICD-10-CM

## 2012-10-09 DIAGNOSIS — M1990 Unspecified osteoarthritis, unspecified site: Secondary | ICD-10-CM

## 2012-10-09 DIAGNOSIS — R112 Nausea with vomiting, unspecified: Secondary | ICD-10-CM

## 2012-10-09 DIAGNOSIS — Z79899 Other long term (current) drug therapy: Secondary | ICD-10-CM

## 2012-10-09 DIAGNOSIS — E860 Dehydration: Secondary | ICD-10-CM | POA: Diagnosis present

## 2012-10-09 DIAGNOSIS — F10931 Alcohol use, unspecified with withdrawal delirium: Principal | ICD-10-CM | POA: Diagnosis present

## 2012-10-09 DIAGNOSIS — Z7982 Long term (current) use of aspirin: Secondary | ICD-10-CM

## 2012-10-09 DIAGNOSIS — I10 Essential (primary) hypertension: Secondary | ICD-10-CM

## 2012-10-09 DIAGNOSIS — R404 Transient alteration of awareness: Secondary | ICD-10-CM | POA: Diagnosis not present

## 2012-10-09 DIAGNOSIS — J4489 Other specified chronic obstructive pulmonary disease: Secondary | ICD-10-CM | POA: Diagnosis present

## 2012-10-09 DIAGNOSIS — F3289 Other specified depressive episodes: Secondary | ICD-10-CM | POA: Diagnosis present

## 2012-10-09 DIAGNOSIS — F1010 Alcohol abuse, uncomplicated: Secondary | ICD-10-CM

## 2012-10-09 DIAGNOSIS — F419 Anxiety disorder, unspecified: Secondary | ICD-10-CM

## 2012-10-09 DIAGNOSIS — E785 Hyperlipidemia, unspecified: Secondary | ICD-10-CM

## 2012-10-09 DIAGNOSIS — F329 Major depressive disorder, single episode, unspecified: Secondary | ICD-10-CM | POA: Diagnosis present

## 2012-10-09 DIAGNOSIS — F411 Generalized anxiety disorder: Secondary | ICD-10-CM | POA: Diagnosis present

## 2012-10-09 DIAGNOSIS — Z7902 Long term (current) use of antithrombotics/antiplatelets: Secondary | ICD-10-CM

## 2012-10-09 DIAGNOSIS — D126 Benign neoplasm of colon, unspecified: Secondary | ICD-10-CM

## 2012-10-09 LAB — CBC WITH DIFFERENTIAL/PLATELET
Eosinophils Relative: 0 % (ref 0–5)
HCT: 35.7 % — ABNORMAL LOW (ref 39.0–52.0)
Hemoglobin: 12.7 g/dL — ABNORMAL LOW (ref 13.0–17.0)
Lymphocytes Relative: 18 % (ref 12–46)
Lymphs Abs: 1.1 10*3/uL (ref 0.7–4.0)
MCV: 92 fL (ref 78.0–100.0)
Monocytes Absolute: 0.8 10*3/uL (ref 0.1–1.0)
Monocytes Relative: 12 % (ref 3–12)
RBC: 3.88 MIL/uL — ABNORMAL LOW (ref 4.22–5.81)
WBC: 6.1 10*3/uL (ref 4.0–10.5)

## 2012-10-09 LAB — COMPREHENSIVE METABOLIC PANEL
ALT: 85 U/L — ABNORMAL HIGH (ref 0–53)
BUN: 4 mg/dL — ABNORMAL LOW (ref 6–23)
CO2: 27 mEq/L (ref 19–32)
Calcium: 8.7 mg/dL (ref 8.4–10.5)
Creatinine, Ser: 0.43 mg/dL — ABNORMAL LOW (ref 0.50–1.35)
GFR calc Af Amer: >90 mL/min (ref >90)
GFR calc non Af Amer: >90 mL/min (ref >90)
Glucose, Bld: 94 mg/dL (ref 70–99)
Sodium: 137 mEq/L (ref 135–145)

## 2012-10-09 LAB — URINALYSIS, ROUTINE W REFLEX MICROSCOPIC
Bilirubin Urine: NEGATIVE
Protein, ur: NEGATIVE mg/dL
Urobilinogen, UA: 0.2 mg/dL (ref 0.0–1.0)

## 2012-10-09 LAB — PROTIME-INR: Prothrombin Time: 13.4 seconds (ref 11.6–15.2)

## 2012-10-09 LAB — LIPASE, BLOOD: Lipase: 24 U/L (ref 11–59)

## 2012-10-09 MED ORDER — LORAZEPAM 1 MG PO TABS
0.0000 mg | ORAL_TABLET | Freq: Four times a day (QID) | ORAL | Status: DC
  Administered 2012-10-09: 1 mg via ORAL
  Administered 2012-10-10: 2 mg via ORAL
  Filled 2012-10-09: qty 1
  Filled 2012-10-09: qty 2

## 2012-10-09 MED ORDER — FOLIC ACID 400 MCG PO TABS: 400.0000 ug | ORAL_TABLET | Freq: Every day | ORAL | Status: DC

## 2012-10-09 MED ORDER — SODIUM CHLORIDE 0.9 % IV SOLN: INTRAVENOUS | Status: DC

## 2012-10-09 MED ORDER — LORATADINE 10 MG PO TABS
10.0000 mg | ORAL_TABLET | Freq: Every day | ORAL | Status: DC
  Administered 2012-10-09 – 2012-10-10 (×2): 10 mg via ORAL
  Filled 2012-10-09 (×2): qty 1

## 2012-10-09 MED ORDER — SODIUM CHLORIDE 0.9 % IJ SOLN
INTRAMUSCULAR | Status: AC
  Administered 2012-10-09: 10 mL
  Filled 2012-10-09: qty 3

## 2012-10-09 MED ORDER — LORAZEPAM 1 MG PO TABS: 0.0000 mg | ORAL_TABLET | Freq: Two times a day (BID) | ORAL | Status: DC

## 2012-10-09 MED ORDER — ONDANSETRON HCL 4 MG/2ML IJ SOLN: 4.0000 mg | Freq: Four times a day (QID) | INTRAMUSCULAR | Status: DC | PRN

## 2012-10-09 MED ORDER — PANTOPRAZOLE SODIUM 40 MG IV SOLR
40.0000 mg | INTRAVENOUS | Status: DC
  Administered 2012-10-09 – 2012-10-10 (×2): 40 mg via INTRAVENOUS
  Filled 2012-10-09 (×2): qty 40

## 2012-10-09 MED ORDER — CLOPIDOGREL BISULFATE 75 MG PO TABS
75.0000 mg | ORAL_TABLET | Freq: Every day | ORAL | Status: DC
  Administered 2012-10-10 – 2012-10-13 (×4): 75 mg via ORAL
  Filled 2012-10-09 (×5): qty 1

## 2012-10-09 MED ORDER — LORAZEPAM 2 MG/ML IJ SOLN
1.0000 mg | Freq: Four times a day (QID) | INTRAMUSCULAR | Status: DC | PRN
  Administered 2012-10-09 – 2012-10-10 (×3): 1 mg via INTRAVENOUS
  Filled 2012-10-09: qty 1
  Filled 2012-10-09: qty 2
  Filled 2012-10-09: qty 1

## 2012-10-09 MED ORDER — POTASSIUM GLUCONATE 595 MG PO CAPS: 1.0000 | ORAL_CAPSULE | Freq: Every day | ORAL | Status: DC

## 2012-10-09 MED ORDER — VITAMIN B-1 250 MG PO TABS: 125.0000 mg | ORAL_TABLET | Freq: Every day | ORAL | Status: DC

## 2012-10-09 MED ORDER — ONDANSETRON HCL 4 MG/2ML IJ SOLN
4.0000 mg | Freq: Once | INTRAMUSCULAR | Status: AC
  Administered 2012-10-09: 4 mg via INTRAVENOUS
  Filled 2012-10-09: qty 2

## 2012-10-09 MED ORDER — VITAMIN B-1 100 MG PO TABS
100.0000 mg | ORAL_TABLET | Freq: Every day | ORAL | Status: DC
  Administered 2012-10-09 – 2012-10-13 (×4): 100 mg via ORAL
  Filled 2012-10-09 (×4): qty 1

## 2012-10-09 MED ORDER — ONDANSETRON HCL 4 MG PO TABS: 4.0000 mg | ORAL_TABLET | Freq: Four times a day (QID) | ORAL | Status: DC | PRN

## 2012-10-09 MED ORDER — FOLIC ACID 1 MG PO TABS
1.0000 mg | ORAL_TABLET | Freq: Every day | ORAL | Status: DC
  Administered 2012-10-09 – 2012-10-10 (×2): 1 mg via ORAL
  Filled 2012-10-09 (×2): qty 1

## 2012-10-09 MED ORDER — THIAMINE HCL 100 MG/ML IJ SOLN
Freq: Once | INTRAVENOUS | Status: AC
  Administered 2012-10-09: 18:00:00 via INTRAVENOUS
  Filled 2012-10-09: qty 1000

## 2012-10-09 MED ORDER — POTASSIUM CHLORIDE IN NACL 40-0.9 MEQ/L-% IV SOLN
INTRAVENOUS | Status: DC
  Administered 2012-10-09: 18:00:00 via INTRAVENOUS
  Administered 2012-10-10: 100 mL/h via INTRAVENOUS
  Filled 2012-10-09 (×3): qty 1000

## 2012-10-09 MED ORDER — LORAZEPAM 1 MG PO TABS
1.0000 mg | ORAL_TABLET | Freq: Four times a day (QID) | ORAL | Status: DC | PRN
  Filled 2012-10-09: qty 1

## 2012-10-09 MED ORDER — PAROXETINE HCL 20 MG PO TABS
20.0000 mg | ORAL_TABLET | Freq: Every day | ORAL | Status: DC
  Administered 2012-10-09 – 2012-10-10 (×2): 20 mg via ORAL
  Filled 2012-10-09 (×4): qty 1

## 2012-10-09 MED ORDER — CLONAZEPAM 0.5 MG PO TABS
1.0000 mg | ORAL_TABLET | Freq: Three times a day (TID) | ORAL | Status: DC | PRN
Start: 2012-10-09 — End: 2012-10-09

## 2012-10-09 MED ORDER — NICOTINE 21 MG/24HR TD PT24
21.0000 mg | MEDICATED_PATCH | Freq: Every day | TRANSDERMAL | Status: DC
  Administered 2012-10-09 – 2012-10-13 (×5): 21 mg via TRANSDERMAL
  Filled 2012-10-09 (×5): qty 1

## 2012-10-09 MED ORDER — ONDANSETRON HCL 4 MG/2ML IJ SOLN: 4.0000 mg | INTRAMUSCULAR | Status: AC | PRN

## 2012-10-09 MED ORDER — THIAMINE HCL 100 MG/ML IJ SOLN
100.0000 mg | Freq: Every day | INTRAMUSCULAR | Status: DC
  Administered 2012-10-11: 100 mg via INTRAVENOUS
  Filled 2012-10-09: qty 2

## 2012-10-09 MED ORDER — ADULT MULTIVITAMIN W/MINERALS CH
1.0000 | ORAL_TABLET | Freq: Every day | ORAL | Status: DC
  Administered 2012-10-09 – 2012-10-10 (×2): 1 via ORAL
  Filled 2012-10-09 (×2): qty 1

## 2012-10-09 NOTE — H&P (Signed)
[facility] History and Physical  Roy [name] ZOX:[id number] DOB: 11/26/1946 DOA: 10/09/2012  Referring physician: Dr. Ignacia Palma, ER physician. PCP: Dwana Melena, MD  Specialists: Dr. Darrick Penna, gastroenterology.  Chief Complaint: Altered mental status, vomiting and diarrhea.  HPI: Roy [name] is a 66 y.o. male who presents with the above symptoms since this morning. The wife describes that since approximately 4 AM this morning, he has been confused, has had about 3-4 falls, one of them apparently injured his head without loss of consciousness. He also has had vomiting and diarrhea. There is no hematemesis or rectal bleeding. He does not describe any abdominal pain. Usually drinks 6 pack of beer every day. It is not clear whether he did this yesterday or not. So far today, he has had 1 beer.    Review of Systems: .  Apart from history of present illness, other systems negative.  Past Medical History  Diagnosis Date  . Cerebrovascular disease 2009    TIA; 2009- right ICA stent; re-intervention for restenosis complicated by [facility] w/o sx  . Degenerative joint disease     Total shoulder arthroplasty-right  . LBBB (left bundle branch block)     Normal echo-2011; stress nuclear in 09/2010--septal hypoperfusion representing nontransmural infarction or the effect of left bundle branch block, no ischemia  . Hyperlipidemia   . Hypertension   . Chronic obstructive pulmonary disease   . Tobacco abuse     -100 pack years; 1.5 packs per day  . Anxiety and depression   . Alcohol abuse     6 beers per day; hospital admission in 2009 for withdrawal symptoms  . Thrombocytopenia   . Tubular adenoma of colon    Past Surgical History  Procedure Date  . Lumbar fusion 2010  . Vasectomy 1971  . Colonoscopy w/ polypectomy 2011  . Total shoulder arthroplasty 2011    Right-Dr. Ave Filter  . Colonoscopy 08/22/09    Fields-(Tubular Adenoma)3-mm transverse polyp/4-mm polyp otherwise noraml/small  internal hemorrhoids   Social History:  Patient is married and lives with his wife. He smokes at least a pack of cigarettes per day. He is an alcoholic, drinking usually a sixpack of beer per day.   No Known Allergies  Family History  Problem Relation Age of Onset  . Prostate cancer Brother   . Coronary artery disease Mother   . Alzheimer's disease Sister      Prior to Admission medications   Medication Sig Start Date End Date Taking? Authorizing Provider  amLODipine-benazepril (LOTREL) 5-20 MG per capsule Take 1 capsule by mouth 2 (two) times daily.    Yes Historical Provider, MD  aspirin EC 81 MG tablet Take 81 mg by mouth daily.   Yes Historical Provider, MD  atorvastatin (LIPITOR) 20 MG tablet Take 20 mg by mouth daily.   Yes Historical Provider, MD  cholecalciferol (VITAMIN D) 1000 UNITS tablet Take 1,000 Units by mouth daily.   Yes Historical Provider, MD  clonazePAM (KLONOPIN) 1 MG tablet Take 1 mg by mouth 3 (three) times daily as needed. For anxiety   Yes Historical Provider, MD  clopidogrel (PLAVIX) 75 MG tablet Take 75 mg by mouth daily.     Yes Historical Provider, MD  Coenzyme Q10 (CO Q 10) 100 MG CAPS Take 1 capsule by mouth daily.   Yes Historical Provider, MD  cyanocobalamin 2000 MCG tablet Take 2,000 mcg by mouth 3 (three) times a week. Takes onTuesday, Thursday, and Saturday   Yes Historical Provider, MD  folic acid (  FOLVITE) 400 MCG tablet Take 400 mcg by mouth daily.   Yes Historical Provider, MD  ibuprofen (ADVIL,MOTRIN) 200 MG tablet Take 400 mg by mouth 2 (two) times daily as needed. For pain   Yes Historical Provider, MD  Boris Lown Oil 300 MG CAPS Take 1 capsule by mouth daily.   Yes Historical Provider, MD  loratadine (CLARITIN) 10 MG tablet Take 10 mg by mouth daily.   Yes Historical Provider, MD  Multiple Vitamins-Minerals (CENTRUM SILVER PO) Take 0.5 tablets by mouth 2 (two) times daily.    Yes Historical Provider, MD  PARoxetine (PAXIL) 20 MG tablet Take 20 mg by  mouth daily.   Yes Historical Provider, MD  Potassium Gluconate 595 MG CAPS Take 1 capsule by mouth daily.   Yes Historical Provider, MD  Prenatal MV-Min-Fe Fum-FA-DHA (PRENATAL 1 PO) Take 1 tablet by mouth daily.   Yes Historical Provider, MD  Thiamine HCl (VITAMIN B-1) 250 MG tablet Take 125 mg by mouth daily.    Yes Historical Provider, MD  vitamin C (ASCORBIC ACID) 500 MG tablet Take 500 mg by mouth daily.     Yes Historical Provider, MD   Physical Exam: Filed Vitals:   10/09/12 1050  BP: 120/69  Pulse: 81  Temp: 97.9 F (36.6 C)  TempSrc: Oral  Resp: 20  SpO2: 95%     General:  He looks clinically dehydrated. His delirious.  Eyes: No pallor. No obvious jaundice.  ENT: No abnormalities.  Neck: No lymphadenopathy.  Cardiovascular: Heart sounds are present and normal and appeared to be in sinus rhythm. No murmurs.  Respiratory: Lung fields are clinically clear.  Abdomen: Soft, nontender, no hepatosplenomegaly. No tenderness.  Skin: No significant rash.  Musculoskeletal: No joint effusions.  Psychiatric: Confused.  Neurologic: Delirious. No obvious focal neurological signs.  Labs on Admission:  Basic Metabolic Panel:  Lab 10/09/12 9147  NA 137  K 3.3*  CL 100  CO2 27  GLUCOSE 94  BUN 4*  CREATININE 0.43*  CALCIUM 8.7  MG --  PHOS --   Liver Function Tests:  Lab 10/09/12 1244  AST 208*  ALT 85*  ALKPHOS 222*  BILITOT 0.9  PROT 6.1  ALBUMIN 3.2*    Lab 10/09/12 1244  LIPASE 24  AMYLASE --    CBC:  Lab 10/09/12 1244  WBC 6.1  NEUTROABS 4.2  HGB 12.7*  HCT 35.7*  MCV 92.0  PLT 141*      Radiological Exams on Admission: Dg Abd Acute W/chest  10/09/2012  *RADIOLOGY REPORT*  Clinical Data: Nausea, vomiting, diarrhea  ACUTE ABDOMEN SERIES (ABDOMEN 2 VIEW & CHEST 1 VIEW)  Comparison: 09/25/2010  Findings: Low volume exam with prominent vascular and interstitial markings.  Minimal basilar atelectasis.  No CHF or pneumonia.  No effusion or  pneumothorax.  Right shoulder hemiarthroplasty noted.  No free air evident.  Mild gaseous distention of small and large bowel.  Transverse colon and splenic flexure are air distended. Appearance is nonspecific.  No definite obstruction.  Ileus could have this appearance.  There are scattered air fluid levels on the upright exam.  Degenerative and postop changes of the spine. Atherosclerosis of the aorta and iliac vessels.  IMPRESSION: Low volume chest exam.  No free air  Gaseous distention of small and large bowel, nonspecific. Scattered air fluid levels.  This can be seen with ongoing diarrhea versus mild ileus.  No definite obstruction.   Original Report Authenticated By: Judie Petit. Miles Costain, M.D.  Assessment/Plan   1. Delirium, alcohol. This may be withdrawal. 2. Alcohol abuse. 3. Tobacco abuse. 4. Cerebrovascular disease. 5. COPD, stable. 6. Possible head injury with history of fall today.   Plan: 1. Admit to medical floor. 2. Alcohol withdrawal protocol. 3. CT head scan stat. Need to make sure that he does not have a intracerebral bleed. 4. Intravenous fluids for hydration. Further recommendations will depend on patient's hospital progress.  Code Status: Full code.  Family Communication: Discussed plan with patient and his wife at the bedside.   Disposition Plan: Home in medically stable.   Time spent: 45 minutes.  Wilson Singer [facility] Pager [phone].  If 7PM-7AM, please contact night-coverage [email or web address] Password [facility] 10/09/2012, 4:41 PM

## 2012-10-09 NOTE — ED Notes (Signed)
Per EMS, called for pt not eating, n/v/d and confusion

## 2012-10-09 NOTE — ED Notes (Signed)
Pt states that he has not been eating well since Flu shot last Friday, last night and today with N/V, pt alert and oriented to name and place only, not to month and year

## 2012-10-09 NOTE — ED Notes (Signed)
Gave patient something to drink at patient request and MD approval.

## 2012-10-09 NOTE — ED Provider Notes (Signed)
History  This chart was scribed for Roy Cooper III, MD by Ardeen Jourdain, ED Scribe. This patient was seen in room APA10/APA10 and the patient's care was started at 1155.  CSN: [id number]  Arrival date & time 10/09/12  1048   First MD Initiated Contact with Patient 10/09/12 1155      Chief Complaint  Patient presents with  . Weakness     The history is provided by the patient. No language interpreter was used.    Roy [name] is a 66 y.o. male brought in by ambulance, who presents to the Emergency Department complaining of generalized weakness with associated nausea, diarrhea, emesis, confusion and difficulty with ambulating. His family states that he has not been able to eat much over the past few days. They also state that the symptoms started a few days ago. His family reports that he has fallen 4 times since 4:00 AM this morning. He has a h/o HTN, LBBB, COPD and TIA. He is a current everyday smoker and everyday alcohol user.   Past Medical History  Diagnosis Date  . Cerebrovascular disease 2009    TIA; 2009- right ICA stent; re-intervention for restenosis complicated by [facility] w/o sx  . Degenerative joint disease     Total shoulder arthroplasty-right  . LBBB (left bundle branch block)     Normal echo-2011; stress nuclear in 09/2010--septal hypoperfusion representing nontransmural infarction or the effect of left bundle branch block, no ischemia  . Hyperlipidemia   . Hypertension   . Chronic obstructive pulmonary disease   . Tobacco abuse     -100 pack years; 1.5 packs per day  . Anxiety and depression   . Alcohol abuse     6 beers per day; hospital admission in 2009 for withdrawal symptoms  . Thrombocytopenia   . Tubular adenoma of colon     Past Surgical History  Procedure Date  . Lumbar fusion 2010  . Vasectomy 1971  . Colonoscopy w/ polypectomy 2011  . Total shoulder arthroplasty 2011    Right-Dr. Ave Filter  . Colonoscopy 08/22/09    Fields-(Tubular  Adenoma)3-mm transverse polyp/4-mm polyp otherwise noraml/small internal hemorrhoids    Family History  Problem Relation Age of Onset  . Prostate cancer Brother   . Coronary artery disease Mother   . Alzheimer's disease Sister     History  Substance Use Topics  . Smoking status: Current Every Day Smoker -- 1.0 packs/day for 60 years    Types: Cigarettes  . Smokeless tobacco: Never Used  . Alcohol Use: Yes     Comment: 12 pack in a week (1/2 can of beer per day) x 60years      Review of Systems  Constitutional: Positive for appetite change. Negative for fever.  Respiratory: Positive for cough.   Gastrointestinal: Positive for nausea, vomiting and diarrhea.  Neurological: Positive for weakness.  Psychiatric/Behavioral: Positive for confusion.  All other systems reviewed and are negative.    Allergies  Review of patient's allergies indicates no known allergies.  Home Medications   Current Outpatient Rx  Name  Route  Sig  Dispense  Refill  . AMLODIPINE BESY-BENAZEPRIL HCL 5-20 MG PO CAPS   Oral   Take 1 capsule by mouth 2 (two) times daily.          . ASPIRIN EC 81 MG PO TBEC   Oral   Take 81 mg by mouth daily.         . ATORVASTATIN CALCIUM 20 MG PO TABS  Oral   Take 20 mg by mouth daily.         [name] VITAMIN D 1000 UNITS PO TABS   Oral   Take 1,000 Units by mouth daily.         [name] CLONAZEPAM 1 MG PO TABS   Oral   Take 1 mg by mouth 3 (three) times daily as needed. For anxiety         . CLOPIDOGREL BISULFATE 75 MG PO TABS   Oral   Take 75 mg by mouth daily.           . CO Q 10 100 MG PO CAPS   Oral   Take 1 capsule by mouth daily.         . CYANOCOBALAMIN 2000 MCG PO TABS   Oral   Take 2,000 mcg by mouth 3 (three) times a week. Takes onTuesday, Thursday, and Saturday         . FOLIC ACID 400 MCG PO TABS   Oral   Take 400 mcg by mouth daily.         . IBUPROFEN 200 MG PO TABS   Oral   Take 400 mg by mouth 2 (two) times daily as  needed. For pain         . KRILL OIL 300 MG PO CAPS   Oral   Take 1 capsule by mouth daily.         [name] LORATADINE 10 MG PO TABS   Oral   Take 10 mg by mouth daily.         . CENTRUM SILVER PO   Oral   Take 0.5 tablets by mouth 2 (two) times daily.          [name] PAROXETINE HCL 20 MG PO TABS   Oral   Take 20 mg by mouth daily.         [name] POTASSIUM GLUCONATE 595 MG PO CAPS   Oral   Take 1 capsule by mouth daily.         [name] PRENATAL 1 PO   Oral   Take 1 tablet by mouth daily.         [name] VITAMIN B-1 250 MG PO TABS   Oral   Take 125 mg by mouth daily.          [name] VITAMIN C 500 MG PO TABS   Oral   Take 500 mg by mouth daily.             Triage Vitals: BP 120/69  Pulse 81  Temp 97.9 F (36.6 C) (Oral)  Resp 20  SpO2 95%  Physical Exam  Nursing note and vitals reviewed. Constitutional: He is oriented to person, place, and time. He appears well-developed and well-nourished. No distress.       Drowsy but arousable, not talking much, no vocal weakness  HENT:  Head: Normocephalic and atraumatic.  Right Ear: External ear normal.  Left Ear: External ear normal.  Nose: Nose normal.       Tongue and mouth dry, tongue suggests dehydration   Eyes: Conjunctivae normal and EOM are normal. Pupils are equal, round, and reactive to light.  Neck: Normal range of motion. Neck supple. No tracheal deviation present.  Cardiovascular: Normal rate, regular rhythm and normal heart sounds.   Pulmonary/Chest: Effort normal and breath sounds normal. No respiratory distress.  Abdominal: Soft. Bowel sounds are normal. He exhibits no distension. There is no tenderness.  Musculoskeletal: Normal range of  motion. He exhibits no edema.       Extremities intact and normal   Neurological: He is alert and oriented to person, place, and time.  Skin: Skin is warm and dry.  Psychiatric: He has a normal mood and affect. His behavior is normal.    ED Course  Procedures (including critical  care time)  DIAGNOSTIC STUDIES: Oxygen Saturation is 95% on room air, normal by my interpretation.    COORDINATION OF CARE:  12:33 PM: Discussed treatment plan which includes blood work with pt at bedside and pt agreed to plan.   3:49 PM Results for orders placed during the hospital encounter of 10/09/12  CBC WITH DIFFERENTIAL      Component Value Range   WBC 6.1  4.0 - 10.5 K/uL   RBC 3.88 (*) 4.22 - 5.81 MIL/uL   Hemoglobin 12.7 (*) 13.0 - 17.0 g/dL   HCT 16.1 (*) 09.6 - 04.5 %   MCV 92.0  78.0 - 100.0 fL   MCH 32.7  26.0 - 34.0 pg   MCHC 35.6  30.0 - 36.0 g/dL   RDW 40.9  81.1 - 91.4 %   Platelets 141 (*) 150 - 400 K/uL   Neutrophils Relative 69  43 - 77 %   Neutro Abs 4.2  1.7 - 7.7 K/uL   Lymphocytes Relative 18  12 - 46 %   Lymphs Abs 1.1  0.7 - 4.0 K/uL   Monocytes Relative 12  3 - 12 %   Monocytes Absolute 0.8  0.1 - 1.0 K/uL   Eosinophils Relative 0  0 - 5 %   Eosinophils Absolute 0.0  0.0 - 0.7 K/uL   Basophils Relative 0  0 - 1 %   Basophils Absolute 0.0  0.0 - 0.1 K/uL  COMPREHENSIVE METABOLIC PANEL      Component Value Range   Sodium 137  135 - 145 mEq/L   Potassium 3.3 (*) 3.5 - 5.1 mEq/L   Chloride 100  96 - 112 mEq/L   CO2 27  19 - 32 mEq/L   Glucose, Bld 94  70 - 99 mg/dL   BUN 4 (*) 6 - 23 mg/dL   Creatinine, Ser 7.82 (*) 0.50 - 1.35 mg/dL   Calcium 8.7  8.4 - 95.6 mg/dL   Total Protein 6.1  6.0 - 8.3 g/dL   Albumin 3.2 (*) 3.5 - 5.2 g/dL   AST 213 (*) 0 - 37 U/L   ALT 85 (*) 0 - 53 U/L   Alkaline Phosphatase 222 (*) 39 - 117 U/L   Total Bilirubin 0.9  0.3 - 1.2 mg/dL   GFR calc non Af Amer >90  >90 mL/min   GFR calc Af Amer >90  >90 mL/min  LIPASE, BLOOD      Component Value Range   Lipase 24  11 - 59 U/L  URINALYSIS, ROUTINE W REFLEX MICROSCOPIC      Component Value Range   Color, Urine YELLOW  YELLOW   APPearance CLEAR  CLEAR   Specific Gravity, Urine <1.005 (*) 1.005 - 1.030   pH 6.0  5.0 - 8.0   Glucose, UA NEGATIVE  NEGATIVE mg/dL    Hgb urine dipstick TRACE (*) NEGATIVE   Bilirubin Urine NEGATIVE  NEGATIVE   Ketones, ur TRACE (*) NEGATIVE mg/dL   Protein, ur NEGATIVE  NEGATIVE mg/dL   Urobilinogen, UA 0.2  0.0 - 1.0 mg/dL   Nitrite NEGATIVE  NEGATIVE   Leukocytes, UA NEGATIVE  NEGATIVE  PROTIME-INR  Component Value Range   Prothrombin Time 13.4  11.6 - 15.2 seconds   INR 1.03  0.00 - 1.49  APTT      Component Value Range   aPTT 30  24 - 37 seconds  URINE MICROSCOPIC-ADD ON      Component Value Range   Urine-Other       Value: NO FORMED ELEMENTS SEEN ON URINE MICROSCOPIC EXAMINATION   Dg Abd Acute W/chest  10/09/2012  *RADIOLOGY REPORT*  Clinical Data: Nausea, vomiting, diarrhea  ACUTE ABDOMEN SERIES (ABDOMEN 2 VIEW & CHEST 1 VIEW)  Comparison: 09/25/2010  Findings: Low volume exam with prominent vascular and interstitial markings.  Minimal basilar atelectasis.  No CHF or pneumonia.  No effusion or pneumothorax.  Right shoulder hemiarthroplasty noted.  No free air evident.  Mild gaseous distention of small and large bowel.  Transverse colon and splenic flexure are air distended. Appearance is nonspecific.  No definite obstruction.  Ileus could have this appearance.  There are scattered air fluid levels on the upright exam.  Degenerative and postop changes of the spine. Atherosclerosis of the aorta and iliac vessels.  IMPRESSION: Low volume chest exam.  No free air  Gaseous distention of small and large bowel, nonspecific. Scattered air fluid levels.  This can be seen with ongoing diarrhea versus mild ileus.  No definite obstruction.   Original Report Authenticated By: Judie Petit. Shick, M.D.    3:51 PM Pt resting comfortably, receiving IV fluids.  Review of lab tests shows elevated LFT's.  Will request admission for rehydration, observation for alcohol withdrawal.     1. Nausea and vomiting   2. Dehydration   3. Alcohol abuse     I personally performed the services described in this documentation, which was scribed  in my presence. The recorded information has been reviewed and is accurate.  Osvaldo Human, MD      Roy Cooper III, MD 10/09/12 [phone]

## 2012-10-10 ENCOUNTER — Inpatient Hospital Stay (HOSPITAL_COMMUNITY): Payer: Medicare Other

## 2012-10-10 ENCOUNTER — Ambulatory Visit (HOSPITAL_COMMUNITY): Admission: RE | Admit: 2012-10-10 | Payer: Medicare Other | Source: Ambulatory Visit

## 2012-10-10 DIAGNOSIS — R4182 Altered mental status, unspecified: Secondary | ICD-10-CM | POA: Diagnosis not present

## 2012-10-10 LAB — COMPREHENSIVE METABOLIC PANEL
ALT: 82 U/L — ABNORMAL HIGH (ref 0–53)
Alkaline Phosphatase: 202 U/L — ABNORMAL HIGH (ref 39–117)
GFR calc Af Amer: >90 mL/min (ref >90)
Glucose, Bld: 89 mg/dL (ref 70–99)
Potassium: 4 mEq/L (ref 3.5–5.1)
Sodium: 138 mEq/L (ref 135–145)
Total Protein: 5.8 g/dL — ABNORMAL LOW (ref 6.0–8.3)

## 2012-10-10 LAB — CBC
HCT: 34.4 % — ABNORMAL LOW (ref 39.0–52.0)
MCH: 32.9 pg (ref 26.0–34.0)
MCHC: 34.9 g/dL (ref 30.0–36.0)
MCV: 94.2 fL (ref 78.0–100.0)
RDW: 13.6 % (ref 11.5–15.5)

## 2012-10-10 LAB — PROTIME-INR: INR: 1.11 (ref 0.00–1.49)

## 2012-10-10 MED ORDER — VITAMIN B-1 100 MG PO TABS: 100.0000 mg | ORAL_TABLET | Freq: Every day | ORAL | Status: DC

## 2012-10-10 MED ORDER — LORAZEPAM 0.5 MG PO TABS
0.5000 mg | ORAL_TABLET | Freq: Two times a day (BID) | ORAL | Status: DC
  Administered 2012-10-10 (×2): 0.5 mg via ORAL
  Filled 2012-10-10 (×2): qty 1

## 2012-10-10 MED ORDER — THIAMINE HCL 100 MG/ML IJ SOLN: 100.0000 mg | Freq: Every day | INTRAMUSCULAR | Status: DC

## 2012-10-10 MED ORDER — LORAZEPAM 2 MG/ML IJ SOLN
0.0000 mg | Freq: Four times a day (QID) | INTRAMUSCULAR | Status: AC
  Administered 2012-10-11: 1 mg via INTRAVENOUS
  Administered 2012-10-11: 2 mg via INTRAVENOUS
  Administered 2012-10-11 (×2): 1 mg via INTRAVENOUS
  Administered 2012-10-11: 2 mg via INTRAVENOUS
  Administered 2012-10-12 (×2): 1 mg via INTRAVENOUS
  Filled 2012-10-10 (×8): qty 1

## 2012-10-10 MED ORDER — LORAZEPAM 2 MG/ML IJ SOLN
0.0000 mg | Freq: Two times a day (BID) | INTRAMUSCULAR | Status: DC
  Administered 2012-10-13: 1 mg via INTRAVENOUS

## 2012-10-10 MED ORDER — SODIUM CHLORIDE 0.9 % IJ SOLN
3.0000 mL | INTRAMUSCULAR | Status: DC | PRN
  Administered 2012-10-10 – 2012-10-12 (×2): 3 mL via INTRAVENOUS

## 2012-10-10 MED ORDER — ADULT MULTIVITAMIN W/MINERALS CH: 1.0000 | ORAL_TABLET | Freq: Every day | ORAL | Status: DC

## 2012-10-10 MED ORDER — SODIUM CHLORIDE 0.9 % IJ SOLN
3.0000 mL | Freq: Two times a day (BID) | INTRAMUSCULAR | Status: DC
  Administered 2012-10-10 – 2012-10-13 (×4): 3 mL via INTRAVENOUS
  Filled 2012-10-10 (×2): qty 3

## 2012-10-10 MED ORDER — FOLIC ACID 1 MG PO TABS: 1.0000 mg | ORAL_TABLET | Freq: Every day | ORAL | Status: DC

## 2012-10-10 MED ORDER — BOOST / RESOURCE BREEZE PO LIQD
1.0000 | Freq: Two times a day (BID) | ORAL | Status: DC
  Administered 2012-10-10: 1 via ORAL

## 2012-10-10 MED ORDER — LORAZEPAM 2 MG/ML IJ SOLN
1.0000 mg | Freq: Four times a day (QID) | INTRAMUSCULAR | Status: DC | PRN
  Administered 2012-10-12: 1 mg via INTRAVENOUS
  Filled 2012-10-10: qty 1

## 2012-10-10 MED ORDER — LORAZEPAM 1 MG PO TABS: 1.0000 mg | ORAL_TABLET | Freq: Four times a day (QID) | ORAL | Status: DC | PRN

## 2012-10-10 MED ORDER — LORAZEPAM 2 MG/ML IJ SOLN
0.5000 mg | Freq: Once | INTRAMUSCULAR | Status: AC
  Administered 2012-10-10: 0.5 mg via INTRAVENOUS
  Filled 2012-10-10: qty 1

## 2012-10-10 NOTE — Care Management Note (Unsigned)
    Page 1 of 2   10/13/2012     11:54:17 AM   CARE MANAGEMENT NOTE 10/13/2012  Patient:  Roy [name],Roy [name]   Account Number:  [id number]  Date Initiated:  10/10/2012  Documentation initiated by:  Rosemary Holms  Subjective/Objective Assessment:   Pt admitted from home where he lives with his wife. Pt currently not oriented to place. Wife is concerned because PTA was able to function with ADL. Wife states she and pt both have a hospital beds. She has a BSC and walker. He uses her...     Action/Plan:   electric wheel chair when he needs it. She would like another electric wheelchair. CM stated that qualification was difficult now but we would assess closer to DC. Pt very agitated   Anticipated DC Date:  10/13/2012   Anticipated DC Plan:  HOME W HOME HEALTH SERVICES      DC Planning Services  CM consult      [facility] Choice  HOME HEALTH   Choice offered to / List presented to:  C-1 Patient   DME arranged  Levan Hurst      DME agency  [facility]     [facility] arranged  HH-1 RN  HH-10 DISEASE MANAGEMENT  HH-2 PT  HH-3 OT  HH-4 NURSE'S AIDE  HH-6 SOCIAL WORKER      HH agency  [facility]   Status of service:  Completed, signed off Medicare Important Message given?  YES (If response is "NO", the following Medicare IM given date fields will be blank) Date Medicare IM given:  10/13/2012 Date Additional Medicare IM given:    Discharge Disposition:  HOME W HOME HEALTH SERVICES  Per UR Regulation:    If discussed at Long Length of Stay Meetings, dates discussed:    Comments:  10/13/12 1145 [name] RN BSN CM Spoke to pt who was oriented x3. Selected [facility].  DME and HH representatives notified.  10/10/12 1500 [name] Leanord Hawking RN BSN CM

## 2012-10-10 NOTE — Progress Notes (Signed)
Physical Therapy Treatment Patient Details Name: Roy [name] MRN: [id number] DOB: 1946/05/22 Today's Date: 10/10/2012 Time: 1130-1201 PT Time Calculation (min): 31 min  PT Assessment / Plan / Recommendation Comments on Treatment Session       Follow Up Recommendations  Supervision/Assistance - 24 hour (unable to determine at this time)     Does the patient have the potential to tolerate intense rehabilitation     Barriers to Discharge None      Equipment Recommendations  Other (comment) (unable to determine)    Recommendations for Other Services    Frequency Min 3X/week   Plan      Precautions / Restrictions Precautions Precautions: Fall Restrictions Weight Bearing Restrictions: No   Pertinent Vitals/Pain     Mobility  Bed Mobility Bed Mobility: Sit to Supine Sit to Supine: 5: Supervision Transfers Transfers: Sit to Stand;Stand to Sit Sit to Stand: 2: Max assist;Without upper extremity assist;From chair/3-in-1 Stand to Sit: 2: Max assist;Without upper extremity assist;To bed Details for Transfer Assistance: Pt was unable to shift weight enough anteriorly in order to stand...he sits in chair, rocking back and forth, max assist to initiate standing...once standing, he acturally arches his spine posteriorly and is unable to hold stance without max assist Ambulation/Gait Ambulation/Gait Assistance: 2: Max assist Ambulation Distance (Feet): 10 Feet Assistive device: Straight cane Gait Pattern: Ataxic General Gait Details: pt has severe ataxia with gait and it is severe enough that I do not think that he would be safe wven with a walker Stairs: No Wheelchair Mobility Wheelchair Mobility: No    Exercises     PT Diagnosis: Difficulty walking;Abnormality of gait  PT Problem List: Decreased activity tolerance;Decreased balance;Decreased mobility;Decreased coordination;Decreased knowledge of use of DME;Decreased safety awareness;Decreased knowledge of  precautions;Impaired tone PT Treatment Interventions: Gait training;Functional mobility training;Balance training;Patient/family education   PT Goals Acute Rehab PT Goals PT Goal Formulation: With patient/family Time For Goal Achievement: 10/10/12 Potential to Achieve Goals: Fair Pt will go Sit to Stand: with upper extremity assist;with supervision PT Goal: Sit to Stand - Progress: Goal set today Pt will go Stand to Sit: with supervision;with upper extremity assist PT Goal: Stand to Sit - Progress: Goal set today Pt will Transfer Bed to Chair/Chair to Bed: with supervision PT Transfer Goal: Bed to Chair/Chair to Bed - Progress: Goal set today Pt will Ambulate: 16 - 50 feet;with min assist;with least restrictive assistive device PT Goal: Ambulate - Progress: Goal set today  Visit Information  Last PT Received On: 10/10/12    Subjective Data  Subjective: It makes me upset when people try to talk to me about not drinking Patient Stated Goal: wants to go home   Cognition  Overall Cognitive Status: Impaired Area of Impairment: Other (comment) (pt is hallucinating at times) Arousal/Alertness: Awake/alert Orientation Level: Appears intact for tasks assessed Behavior During Session: Anxious    Balance  Balance Balance Assessed: Yes Static Sitting Balance Static Sitting - Balance Support: Bilateral upper extremity supported;Feet supported Static Sitting - Level of Assistance: 5: Stand by assistance Static Standing Balance Static Standing - Balance Support: Bilateral upper extremity supported Static Standing - Level of Assistance: 1: +1 Total assist  End of Session PT - End of Session Equipment Utilized During Treatment: Gait belt Activity Tolerance: Treatment limited secondary to medical complications (Comment) (limited due to severe ataxia, balance dysfunction) Patient left: in bed;with call bell/phone within reach;with bed alarm set;with family/visitor present Nurse  Communication: Mobility status   GP  Myrlene Broker L 10/10/2012, 2:00 PM

## 2012-10-10 NOTE — Progress Notes (Signed)
Called and spoke with Dr. Orvan Falconer in regards to patient's agitation and tremors, new order given to start CIWA protocol

## 2012-10-10 NOTE — Progress Notes (Signed)
Pt ambulated to nursing station and back to his room  Pt was off balance and required staff x2.

## 2012-10-10 NOTE — Progress Notes (Signed)
UR Chart Review Completed  

## 2012-10-10 NOTE — Progress Notes (Signed)
INITIAL ADULT NUTRITION ASSESSMENT Date: 10/10/2012   Time: 1:22 PM Reason for Assessment: Malnutrition Screen  ASSESSMENT: Male 66 y.o.  Dx: Delirium, alcoholic   Past Medical History  Diagnosis Date  . Cerebrovascular disease 2009    TIA; 2009- right ICA stent; re-intervention for restenosis complicated by [facility] w/o sx  . Degenerative joint disease     Total shoulder arthroplasty-right  . LBBB (left bundle branch block)     Normal echo-2011; stress nuclear in 09/2010--septal hypoperfusion representing nontransmural infarction or the effect of left bundle branch block, no ischemia  . Hyperlipidemia   . Hypertension   . Chronic obstructive pulmonary disease   . Tobacco abuse     -100 pack years; 1.5 packs per day  . Anxiety and depression   . Alcohol abuse     6 beers per day; hospital admission in 2009 for withdrawal symptoms  . Thrombocytopenia   . Tubular adenoma of colon     Scheduled Meds:   . clopidogrel  75 mg Oral Daily  . folic acid  1 mg Oral Daily  . loratadine  10 mg Oral Daily  . LORazepam  0.5 mg Oral BID  . multivitamin with minerals  1 tablet Oral Daily  . nicotine  21 mg Transdermal Daily  . pantoprazole (PROTONIX) IV  40 mg Intravenous Q24H  . PARoxetine  20 mg Oral Daily  . [COMPLETED] general admission iv infusion   Intravenous Once  . [COMPLETED] sodium chloride      . thiamine  100 mg Oral Daily   Or  . thiamine  100 mg Intravenous Daily  . [DISCONTINUED] folic acid  400 mcg Oral Daily  . [DISCONTINUED] LORazepam  0-4 mg Oral Q6H  . [DISCONTINUED] LORazepam  0-4 mg Oral Q12H  . [DISCONTINUED] Potassium Gluconate  1 capsule Oral Daily  . [DISCONTINUED] vitamin B-1  125 mg Oral Daily   Continuous Infusions:   . [DISCONTINUED] sodium chloride    . [DISCONTINUED] sodium chloride    . [DISCONTINUED] 0.9 % NaCl with KCl 40 mEq / L 100 mL/hr (10/10/12 0454)   PRN Meds:.[EXPIRED] ondansetron (ZOFRAN) IV, ondansetron (ZOFRAN) IV, ondansetron,  [DISCONTINUED] clonazePAM, [DISCONTINUED] LORazepam, [DISCONTINUED] LORazepam Ht: 5\' 6"  (167.6 cm)  Wt: 139 lb 12.4 oz (63.4 kg)  Ideal Wt: 63.8 kg % Ideal Wt: 99%  Usual Wt: 146-148# per sopouse Wt Readings from Last 10 Encounters:  10/09/12 139 lb 12.4 oz (63.4 kg)  06/18/12 146 lb 9.6 oz (66.497 kg)  05/13/12 147 lb 9.6 oz (66.951 kg)  01/15/12 158 lb (71.668 kg)  04/09/11 153 lb (69.4 kg)  11/09/10 146 lb (66.225 kg)  10/10/10 150 lb (68.04 kg)  10/03/10 151 lb (68.493 kg)     Body mass index is 22.56 kg/(m^2).Normal Range  Food/Nutrition Related Hx: Pt spouse and daughter present and provided hx. He has had some wt loss 5%,8# since July which is not significant. Variable meal intake with evening meal most consistently consumed. ETOH, tobacco abuse noted and likely contributing to inadequate nutritional intake. He does consistantly take a number of oral supplements including fish oil, MVI, Vitamin C, B-vitamins daily. He does not meet criteria for malnutrition at this time.   CMP     Component Value Date/Time   NA 138 10/10/2012 0526   NA 135* 04/16/2012 0807   K 4.0 10/10/2012 0526   K 4.2 04/16/2012 0807   CL 102 10/10/2012 0526   CL 99 04/16/2012 0807   CO2 25 10/10/2012  0526   CO2 26 04/16/2012 0807   GLUCOSE 89 10/10/2012 0526   BUN 3* 10/10/2012 0526   BUN 6 04/16/2012 0807   CREATININE 0.43* 10/10/2012 0526   CREATININE 0.50 04/16/2012 0807   CALCIUM 8.2* 10/10/2012 0526   CALCIUM 9.0 04/16/2012 0807   PROT 5.8* 10/10/2012 0526   PROT 6.6 04/16/2012 0807   ALBUMIN 3.0* 10/10/2012 0526   AST 191* 10/10/2012 0526   AST 120 04/16/2012 0807   ALT 82* 10/10/2012 0526   ALKPHOS 202* 10/10/2012 0526   ALKPHOS 140 04/16/2012 0807   BILITOT 0.8 10/10/2012 0526   BILITOT 0.5 04/16/2012 0807   GFRNONAA >90 10/10/2012 0526   GFRAA >90 10/10/2012 0526    Intake/Output Summary (Last 24 hours) at 10/10/12 1332 Last data filed at 10/10/12 0200  Gross per 24 hour  Intake       0 ml  Output    400 ml  Net   -400 ml     Diet Order: Cardiac  Supplements:MVI, Thiamine, Folic Acid  IVF:    [DISCONTINUED] sodium chloride   [DISCONTINUED] sodium chloride   [DISCONTINUED] 0.9 % NaCl with KCl 40 mEq / L Last Rate: 100 mL/hr (10/10/12 0454)    Estimated Nutritional Needs:   Kcal:1900-2100 kcal/day Protein:75-90 gr/day Fluid:1 ml/kcal  NUTRITION DIAGNOSIS: -Inadequate oral intake (NI-2.1).  Status: Ongoing  RELATED TO: erratic meal pattern  AS EVIDENCE BY: family reported inconsistent meal intake, alcohol and tobacco abuse, wt loss trend  MONITORING/EVALUATION(Goals): Monitor po meal and supplement percent intake Goal: Pt to meet >/= 90% of their estimated nutrition needs; not met  EDUCATION NEEDS: -No education needs identified at this time  INTERVENTION: -Resource Breeze po BID, each supplement provides 250 kcal and 9 grams of protein.  Dietitian [phone]  DOCUMENTATION CODES Per approved criteria  -Not Applicable    Francene Boyers 10/10/2012, 1:22 PM

## 2012-10-10 NOTE — Progress Notes (Signed)
     Subjective: This man is more alert this morning. His no added any further diarrhea. He is able to eat breakfast this morning.           Physical Exam: Blood pressure 117/63, pulse 91, temperature 97.7 F (36.5 C), temperature source Oral, resp. rate 20, height 5\' 6"  (1.676 m), weight 63.4 kg (139 lb 12.4 oz), SpO2 95.00%. He does look systemically well and much less delirious, I would say probably not delirious anymore. Heart sounds are present and normal. Lung fields are clear. Abdomen is soft nontender.   Investigations:     Basic Metabolic Panel:  Basename 10/10/12 0526 10/09/12 1244  NA 138 137  K 4.0 3.3*  CL 102 100  CO2 25 27  GLUCOSE 89 94  BUN 3* 4*  CREATININE 0.43* 0.43*  CALCIUM 8.2* 8.7  MG -- --  PHOS -- --   Liver Function Tests:  [facility] 10/10/12 0526 10/09/12 1244  AST 191* 208*  ALT 82* 85*  ALKPHOS 202* 222*  BILITOT 0.8 0.9  PROT 5.8* 6.1  ALBUMIN 3.0* 3.2*     CBC:  Basename 10/10/12 0526 10/09/12 1244  WBC 5.6 6.1  NEUTROABS -- 4.2  HGB 12.0* 12.7*  HCT 34.4* 35.7*  MCV 94.2 92.0  PLT 135* 141*    Ct Head Wo Contrast  10/09/2012  *RADIOLOGY REPORT*  Clinical Data: Altered mental status.  Recent falls.  CT HEAD WITHOUT CONTRAST  Technique:  Contiguous axial images were obtained from the base of the skull through the vertex without contrast.  Comparison: None.  Findings: There is no evidence for acute infarction, intracranial hemorrhage, mass lesion, hydrocephalus, or extra-axial fluid. Moderate atrophy.  Chronic microvascular ischemic change. Calvarium intact.  Vascular calcification with cavernous carotid stent.  No sinus or mastoid disease.  IMPRESSION: Atrophy with chronic microvascular ischemic change.  No acute intracranial abnormality.   Original Report Authenticated By: Davonna Belling, M.D.    Dg Abd Acute W/chest  10/09/2012  *RADIOLOGY REPORT*  Clinical Data: Nausea, vomiting, diarrhea  ACUTE ABDOMEN SERIES (ABDOMEN  2 VIEW & CHEST 1 VIEW)  Comparison: 09/25/2010  Findings: Low volume exam with prominent vascular and interstitial markings.  Minimal basilar atelectasis.  No CHF or pneumonia.  No effusion or pneumothorax.  Right shoulder hemiarthroplasty noted.  No free air evident.  Mild gaseous distention of small and large bowel.  Transverse colon and splenic flexure are air distended. Appearance is nonspecific.  No definite obstruction.  Ileus could have this appearance.  There are scattered air fluid levels on the upright exam.  Degenerative and postop changes of the spine. Atherosclerosis of the aorta and iliac vessels.  IMPRESSION: Low volume chest exam.  No free air  Gaseous distention of small and large bowel, nonspecific. Scattered air fluid levels.  This can be seen with ongoing diarrhea versus mild ileus.  No definite obstruction.   Original Report Authenticated By: Judie Petit. Miles Costain, M.D.       Medications: I have reviewed the patient's current medications.  Impression: 1. Delirium, alcoholic, resolving. 2. Alcohol abuse. 3. Tobacco abuse. 4. COPD, stable. 5. Elevated liver function tests secondary to alcoholism, stable and slightly improving.     Plan: 1. Discontinue IV fluids. Encourage oral intake. 2. Mobilize. If he does well, he can be discharged home today.     LOS: 1 day   Wilson Singer Pager [phone]  10/10/2012, 8:19 AM

## 2012-10-11 DIAGNOSIS — F1020 Alcohol dependence, uncomplicated: Secondary | ICD-10-CM

## 2012-10-11 MED ORDER — DEXTROSE-NACL 5-0.9 % IV SOLN
INTRAVENOUS | Status: DC
  Administered 2012-10-11 – 2012-10-12 (×3): via INTRAVENOUS

## 2012-10-11 MED ORDER — CLONIDINE HCL 0.1 MG/24HR TD PTWK
0.1000 mg | MEDICATED_PATCH | TRANSDERMAL | Status: DC
  Administered 2012-10-11: 0.1 mg via TRANSDERMAL
  Filled 2012-10-11: qty 1

## 2012-10-11 NOTE — Progress Notes (Signed)
Chart reviewed.  Per RN, became confused and tremulous. Ativan schedule accelerated overnight.  Subjective: Unable  Objective: Vital signs in last 24 hours: Filed Vitals:   10/10/12 0600 10/10/12 1400 10/10/12 2325 10/11/12 0552  BP: 117/63 130/73 141/83 138/76  Pulse: 91 86 83 86  Temp: 97.7 F (36.5 C) 97.4 F (36.3 C) 97 F (36.1 C) 97.6 F (36.4 C)  TempSrc: Oral Oral Oral Oral  Resp: 20 20  20   Height:      Weight:      SpO2: 95% 95% 95% 96%   Weight change:   Intake/Output Summary (Last 24 hours) at 10/11/12 0922 Last data filed at 10/10/12 1700  Gross per 24 hour  Intake    480 ml  Output    901 ml  Net   -421 ml   General: Eyes closed. Agitated and bed. Will answer questions or follow commands. Lungs clear to auscultation bilaterally without wheeze rhonchi or rales Cardiovascular regular rate rhythm without murmurs gallops rubs Abdomen soft nontender nondistended Extremities no clubbing cyanosis or edema  Lab Results: Basic Metabolic Panel:  Lab 10/10/12 4332 10/09/12 1244  NA 138 137  K 4.0 3.3*  CL 102 100  CO2 25 27  GLUCOSE 89 94  BUN 3* 4*  CREATININE 0.43* 0.43*  CALCIUM 8.2* 8.7  MG -- --  PHOS -- --   Liver Function Tests:  Lab 10/10/12 0526 10/09/12 1244  AST 191* 208*  ALT 82* 85*  ALKPHOS 202* 222*  BILITOT 0.8 0.9  PROT 5.8* 6.1  ALBUMIN 3.0* 3.2*    Lab 10/09/12 1244  LIPASE 24  AMYLASE --   No results found for this basename: AMMONIA:2 in the last 168 hours CBC:  Lab 10/10/12 0526 10/09/12 1244  WBC 5.6 6.1  NEUTROABS -- 4.2  HGB 12.0* 12.7*  HCT 34.4* 35.7*  MCV 94.2 92.0  PLT 135* 141*   Cardiac Enzymes: No results found for this basename: CKTOTAL:3,CKMB:3,CKMBINDEX:3,TROPONINI:3 in the last 168 hours BNP: No results found for this basename: PROBNP:3 in the last 168 hours D-Dimer: No results found for this basename: DDIMER:2 in the last 168 hours CBG: No results found for this basename: GLUCAP:6 in the last  168 hours Hemoglobin A1C: No results found for this basename: HGBA1C in the last 168 hours Fasting Lipid Panel: No results found for this basename: CHOL,HDL,LDLCALC,TRIG,CHOLHDL,LDLDIRECT in the last 951 hours Thyroid Function Tests: No results found for this basename: TSH,T4TOTAL,FREET4,T3FREE,THYROIDAB in the last 168 hours Coagulation:  Lab 10/10/12 0526 10/09/12 1244  LABPROT 14.2 13.4  INR 1.11 1.03   Anemia Panel: No results found for this basename: VITAMINB12,FOLATE,FERRITIN,TIBC,IRON,RETICCTPCT in the last 168 hours Urine Drug Screen: Drugs of Abuse     Component Value Date/Time   LABOPIA NONE DETECTED 09/17/2008 1336   COCAINSCRNUR NONE DETECTED 09/17/2008 1336   LABBENZ NONE DETECTED 09/17/2008 1336   AMPHETMU NONE DETECTED 09/17/2008 1336   THCU NONE DETECTED 09/17/2008 1336   LABBARB  Value: NONE DETECTED        DRUG SCREEN FOR MEDICAL PURPOSES ONLY.  IF CONFIRMATION IS NEEDED FOR ANY PURPOSE, NOTIFY LAB WITHIN 5 DAYS. 09/17/2008 1336    Alcohol Level: No results found for this basename: ETH:2 in the last 168 hours Urinalysis:  Lab 10/09/12 1249  COLORURINE YELLOW  LABSPEC <1.005*  PHURINE 6.0  GLUCOSEU NEGATIVE  HGBUR TRACE*  BILIRUBINUR NEGATIVE  KETONESUR TRACE*  PROTEINUR NEGATIVE  UROBILINOGEN 0.2  NITRITE NEGATIVE  LEUKOCYTESUR NEGATIVE   Micro Results:  No results found for this or any previous visit (from the past 240 hour(s)). Studies/Results: Ct Head Wo Contrast  10/09/2012  *RADIOLOGY REPORT*  Clinical Data: Altered mental status.  Recent falls.  CT HEAD WITHOUT CONTRAST  Technique:  Contiguous axial images were obtained from the base of the skull through the vertex without contrast.  Comparison: None.  Findings: There is no evidence for acute infarction, intracranial hemorrhage, mass lesion, hydrocephalus, or extra-axial fluid. Moderate atrophy.  Chronic microvascular ischemic change. Calvarium intact.  Vascular calcification with cavernous  carotid stent.  No sinus or mastoid disease.  IMPRESSION: Atrophy with chronic microvascular ischemic change.  No acute intracranial abnormality.   Original Report Authenticated By: Davonna Belling, M.D.    Mr Brain Wo Contrast  10/10/2012  *RADIOLOGY REPORT*  Clinical Data: Altered mental status.  MRI HEAD WITHOUT CONTRAST  Technique:  Multiplanar, multiecho pulse sequences of the brain and surrounding structures were obtained according to standard protocol without intravenous contrast.  Comparison: 10/09/2012 CT. 09/23/2009 and11/23/2011 MR.  Findings: Exam limited by motion degradation.  The patient was attempting to remote head coil.  Fast technique imaging utilized.  No obvious infarct on motion degraded diffusion sequence.  Hemorrhagic breakdown products right hemisphere involving right frontal lobe, opercular region and right temporal lobe consistent with prior hemorrhage and noted on 2010 exam.  No obvious new intracranial hemorrhage.  Global atrophy.  Ventricular prominence slightly asymmetric greater on the right and may be related to atrophy rather hydrocephalus.  Remote small right caudate head infarct.  No obvious mass lesion noted on this motion degraded unenhanced exam.  Right internal carotid artery is narrowed.  Occluded right vertebral artery.  IMPRESSION: Motion degraded exam without evidence of obvious acute infarct. Please see above.   Original Report Authenticated By: Lacy Duverney, M.D.    Dg Abd Acute W/chest  10/09/2012  *RADIOLOGY REPORT*  Clinical Data: Nausea, vomiting, diarrhea  ACUTE ABDOMEN SERIES (ABDOMEN 2 VIEW & CHEST 1 VIEW)  Comparison: 09/25/2010  Findings: Low volume exam with prominent vascular and interstitial markings.  Minimal basilar atelectasis.  No CHF or pneumonia.  No effusion or pneumothorax.  Right shoulder hemiarthroplasty noted.  No free air evident.  Mild gaseous distention of small and large bowel.  Transverse colon and splenic flexure are air distended.  Appearance is nonspecific.  No definite obstruction.  Ileus could have this appearance.  There are scattered air fluid levels on the upright exam.  Degenerative and postop changes of the spine. Atherosclerosis of the aorta and iliac vessels.  IMPRESSION: Low volume chest exam.  No free air  Gaseous distention of small and large bowel, nonspecific. Scattered air fluid levels.  This can be seen with ongoing diarrhea versus mild ileus.  No definite obstruction.   Original Report Authenticated By: Judie Petit. Shick, M.D.    Scheduled Meds:   . clopidogrel  75 mg Oral Daily  . feeding supplement  1 Container Oral BID BM  . folic acid  1 mg Oral Daily  . loratadine  10 mg Oral Daily  . LORazepam  0-4 mg Intravenous Q6H   Followed by  . LORazepam  0-4 mg Intravenous Q12H  . [COMPLETED] LORazepam  0.5 mg Intravenous Once  . LORazepam  0.5 mg Oral BID  . multivitamin with minerals  1 tablet Oral Daily  . nicotine  21 mg Transdermal Daily  . pantoprazole (PROTONIX) IV  40 mg Intravenous Q24H  . PARoxetine  20 mg Oral Daily  . sodium chloride  3 mL Intravenous Q12H  . thiamine  100 mg Oral Daily   Or  . thiamine  100 mg Intravenous Daily  . [DISCONTINUED] folic acid  1 mg Oral Daily  . [DISCONTINUED] LORazepam  0-4 mg Oral Q6H  . [DISCONTINUED] LORazepam  0-4 mg Oral Q12H  . [DISCONTINUED] multivitamin with minerals  1 tablet Oral Daily  . [DISCONTINUED] thiamine  100 mg Intravenous Daily  . [DISCONTINUED] thiamine  100 mg Oral Daily   Continuous Infusions:  PRN Meds:.LORazepam, LORazepam, ondansetron (ZOFRAN) IV, ondansetron, sodium chloride, [DISCONTINUED] LORazepam, [DISCONTINUED] LORazepam Assessment/Plan: Principal Problem:  *Alcohol withdrawal delirium Active Problems:  CEREBROVASCULAR DISEASE  CHRONIC OBSTRUCTIVE PULMONARY DISEASE  Tobacco abuse  Elevated liver function tests  Alcohol dependence  Patient is clearly delirious. Continue Ativan. Thiamine. Once mentation clears, will consult  social work to assist with alcohol treatment. Patient's wife reports that he had 5 years sober, but has never been to rehabilitation or a 12-step program. Hold all nonessential oral medications for now. Updated patient's wife.   LOS: 2 days   Marshall Roehrich L 10/11/2012, 9:22 AM

## 2012-10-12 DIAGNOSIS — I10 Essential (primary) hypertension: Secondary | ICD-10-CM

## 2012-10-12 MED ORDER — ASPIRIN EC 81 MG PO TBEC
81.0000 mg | DELAYED_RELEASE_TABLET | Freq: Every day | ORAL | Status: DC
  Administered 2012-10-12 – 2012-10-13 (×2): 81 mg via ORAL
  Filled 2012-10-12 (×2): qty 1

## 2012-10-12 MED ORDER — ATORVASTATIN CALCIUM 20 MG PO TABS
20.0000 mg | ORAL_TABLET | Freq: Every day | ORAL | Status: DC
  Administered 2012-10-12 – 2012-10-13 (×2): 20 mg via ORAL
  Filled 2012-10-12 (×2): qty 1

## 2012-10-12 MED ORDER — PAROXETINE HCL 20 MG PO TABS
20.0000 mg | ORAL_TABLET | Freq: Every day | ORAL | Status: DC
  Administered 2012-10-12 – 2012-10-13 (×2): 20 mg via ORAL
  Filled 2012-10-12 (×2): qty 1

## 2012-10-12 NOTE — Progress Notes (Signed)
Nurses report less agitation overnight. Has not eaten much, nor ambulated much  Subjective: No complaints  Objective: Vital signs in last 24 hours: Filed Vitals:   10/11/12 1527 10/11/12 1940 10/11/12 2100 10/12/12 0500  BP: 155/82  127/82 145/79  Pulse: 84 98 99 78  Temp: 98.7 F (37.1 C)  98.8 F (37.1 C) 99.3 F (37.4 C)  TempSrc:   Oral Oral  Resp: 20 24 16 20   Height:      Weight:      SpO2: 91% 98% 93% 96%   Weight change:   Intake/Output Summary (Last 24 hours) at 10/12/12 0913 Last data filed at 10/11/12 2100  Gross per 24 hour  Intake 514.17 ml  Output    101 ml  Net 413.17 ml   General: Asleep. Arousable. More appropriate today. Answers to question her to then falls back asleep. Oriented to place and person. Lungs clear to auscultation bilaterally without wheeze rhonchi or rales Cardiovascular regular rate rhythm without murmurs gallops rubs Abdomen soft nontender nondistended Extremities no clubbing cyanosis or edema. Tremor present when he lifts his hands up.  Lab Results: Basic Metabolic Panel:  Lab 10/10/12 5409 10/09/12 1244  NA 138 137  K 4.0 3.3*  CL 102 100  CO2 25 27  GLUCOSE 89 94  BUN 3* 4*  CREATININE 0.43* 0.43*  CALCIUM 8.2* 8.7  MG -- --  PHOS -- --   Liver Function Tests:  Lab 10/10/12 0526 10/09/12 1244  AST 191* 208*  ALT 82* 85*  ALKPHOS 202* 222*  BILITOT 0.8 0.9  PROT 5.8* 6.1  ALBUMIN 3.0* 3.2*    Lab 10/09/12 1244  LIPASE 24  AMYLASE --   No results found for this basename: AMMONIA:2 in the last 168 hours CBC:  Lab 10/10/12 0526 10/09/12 1244  WBC 5.6 6.1  NEUTROABS -- 4.2  HGB 12.0* 12.7*  HCT 34.4* 35.7*  MCV 94.2 92.0  PLT 135* 141*   Cardiac Enzymes: No results found for this basename: CKTOTAL:3,CKMB:3,CKMBINDEX:3,TROPONINI:3 in the last 168 hours BNP: No results found for this basename: PROBNP:3 in the last 168 hours D-Dimer: No results found for this basename: DDIMER:2 in the last 168  hours CBG: No results found for this basename: GLUCAP:6 in the last 168 hours Hemoglobin A1C: No results found for this basename: HGBA1C in the last 168 hours Fasting Lipid Panel: No results found for this basename: CHOL,HDL,LDLCALC,TRIG,CHOLHDL,LDLDIRECT in the last 811 hours Thyroid Function Tests: No results found for this basename: TSH,T4TOTAL,FREET4,T3FREE,THYROIDAB in the last 168 hours Coagulation:  Lab 10/10/12 0526 10/09/12 1244  LABPROT 14.2 13.4  INR 1.11 1.03   Anemia Panel: No results found for this basename: VITAMINB12,FOLATE,FERRITIN,TIBC,IRON,RETICCTPCT in the last 168 hours Urine Drug Screen: Drugs of Abuse     Component Value Date/Time   LABOPIA NONE DETECTED 09/17/2008 1336   COCAINSCRNUR NONE DETECTED 09/17/2008 1336   LABBENZ NONE DETECTED 09/17/2008 1336   AMPHETMU NONE DETECTED 09/17/2008 1336   THCU NONE DETECTED 09/17/2008 1336   LABBARB  Value: NONE DETECTED        DRUG SCREEN FOR MEDICAL PURPOSES ONLY.  IF CONFIRMATION IS NEEDED FOR ANY PURPOSE, NOTIFY LAB WITHIN 5 DAYS. 09/17/2008 1336    Alcohol Level: No results found for this basename: ETH:2 in the last 168 hours Urinalysis:  Lab 10/09/12 1249  COLORURINE YELLOW  LABSPEC <1.005*  PHURINE 6.0  GLUCOSEU NEGATIVE  HGBUR TRACE*  BILIRUBINUR NEGATIVE  KETONESUR TRACE*  PROTEINUR NEGATIVE  UROBILINOGEN 0.2  NITRITE  NEGATIVE  LEUKOCYTESUR NEGATIVE   Micro Results: No results found for this or any previous visit (from the past 240 hour(s)). Studies/Results: Mr Sherrin Daisy Contrast  10/10/2012  *RADIOLOGY REPORT*  Clinical Data: Altered mental status.  MRI HEAD WITHOUT CONTRAST  Technique:  Multiplanar, multiecho pulse sequences of the brain and surrounding structures were obtained according to standard protocol without intravenous contrast.  Comparison: 10/09/2012 CT. 09/23/2009 and11/23/2011 MR.  Findings: Exam limited by motion degradation.  The patient was attempting to remote head coil.  Fast  technique imaging utilized.  No obvious infarct on motion degraded diffusion sequence.  Hemorrhagic breakdown products right hemisphere involving right frontal lobe, opercular region and right temporal lobe consistent with prior hemorrhage and noted on 2010 exam.  No obvious new intracranial hemorrhage.  Global atrophy.  Ventricular prominence slightly asymmetric greater on the right and may be related to atrophy rather hydrocephalus.  Remote small right caudate head infarct.  No obvious mass lesion noted on this motion degraded unenhanced exam.  Right internal carotid artery is narrowed.  Occluded right vertebral artery.  IMPRESSION: Motion degraded exam without evidence of obvious acute infarct. Please see above.   Original Report Authenticated By: Lacy Duverney, M.D.    Scheduled Meds:    . cloNIDine  0.1 mg Transdermal Weekly  . clopidogrel  75 mg Oral Daily  . LORazepam  0-4 mg Intravenous Q6H   Followed by  . LORazepam  0-4 mg Intravenous Q12H  . nicotine  21 mg Transdermal Daily  . sodium chloride  3 mL Intravenous Q12H  . thiamine  100 mg Oral Daily   Or  . thiamine  100 mg Intravenous Daily  . [DISCONTINUED] feeding supplement  1 Container Oral BID BM  . [DISCONTINUED] folic acid  1 mg Oral Daily  . [DISCONTINUED] loratadine  10 mg Oral Daily  . [DISCONTINUED] LORazepam  0.5 mg Oral BID  . [DISCONTINUED] multivitamin with minerals  1 tablet Oral Daily  . [DISCONTINUED] pantoprazole (PROTONIX) IV  40 mg Intravenous Q24H  . [DISCONTINUED] PARoxetine  20 mg Oral Daily   Continuous Infusions:    . dextrose 5 % and 0.9% NaCl 50 mL/hr at 10/12/12 0544   PRN Meds:.LORazepam, LORazepam, ondansetron (ZOFRAN) IV, ondansetron, sodium chloride Assessment/Plan: Principal Problem:  *Alcohol withdrawal delirium Active Problems:  CEREBROVASCULAR DISEASE  CHRONIC OBSTRUCTIVE PULMONARY DISEASE  Tobacco abuse  Elevated liver function tests  Alcohol dependence  Delirium slightly improved.  Will resume oral medications. Increase activity as able. Still quite tremulous and not yet stable for discharge. Updated wife.   LOS: 3 days   Tiny Chaudhary L 10/12/2012, 9:13 AM

## 2012-10-12 NOTE — Progress Notes (Signed)
Patient OOB to chair since 1330. Offered to assist patient back to bed but pt request to stay up longer.

## 2012-10-13 MED ORDER — CLONIDINE HCL 0.1 MG/24HR TD PTWK: 1.0000 | MEDICATED_PATCH | TRANSDERMAL | Status: DC

## 2012-10-13 MED ORDER — LORAZEPAM 1 MG PO TABS: 1.0000 mg | ORAL_TABLET | Freq: Three times a day (TID) | ORAL | Status: DC

## 2012-10-13 NOTE — Discharge Summary (Signed)
Physician Discharge Summary  Roy [name] WUJ:[id number] DOB: September 26, 1946 DOA: 10/09/2012  PCP: Dwana Melena, MD  Admit date: 10/09/2012 Discharge date: 10/13/2012  Time spent: Greater than 30 minutes  Recommendations for Outpatient Follow-up:  1. Follow with primary care physician.   Discharge Diagnoses:  1. Alcohol withdrawal and/delirium. 2. Alcohol abuse. 3. Tobacco abuse. 4. COPD, stable. 5. Cerebrovascular disease, stable.   Discharge Condition: Stable and improved.  Diet recommendation: Regular. No alcohol.  Filed Weights   10/09/12 1715  Weight: 63.4 kg (139 lb 12.4 oz)    History of present illness:  This 66 year old man presents to the hospital with symptoms of altered mental status, vomiting and diarrhea. Please see initial history as outlined below: HPI: Roy [name] is a 66 y.o. male who presents with the above symptoms since this morning. The wife describes that since approximately 4 AM this morning, he has been confused, has had about 3-4 falls, one of them apparently injured his head without loss of consciousness. He also has had vomiting and diarrhea. There is no hematemesis or rectal bleeding. He does not describe any abdominal pain. Usually drinks 6 pack of beer every day. It is not clear whether he did this yesterday or not. So far today, he has had 1 beer.   Hospital Course:  The patient made a slow and gradual improvement throughout the hospitalization. He did initially improve very quickly and seemed to be back to his usual self but then his delirium got worse again. He was given supportive treatment. CT brain and MRI brain scan were unremarkable for any other acute pathology. He  appears to be almost back to his baseline, although he is somewhat tremulous. Had a long discussion with him regarding alcohol abuse.  Procedures:  None.  Consultations:  None.  Discharge Exam: Filed Vitals:   10/11/12 2100 10/12/12 0500 10/12/12 2100 10/13/12 0429  BP:  127/82 145/79 135/82 125/78  Pulse: 99 78 75 78  Temp: 98.8 F (37.1 C) 99.3 F (37.4 C) 98.2 F (36.8 C) 98 F (36.7 C)  TempSrc: Oral Oral Oral Oral  Resp: 16 20 16 20   Height:      Weight:      SpO2: 93% 96% 95% 95%    General: He looks systemically well. He does not appear to be particularly delirious today. Cardiovascular: Heart sounds are present and normal without gallop rhythm. Respiratory: Lung fields are clear. He is alert and orientated. There are no focal neurological signs.  Discharge Instructions  Discharge Orders    Future Orders Please Complete By Expires   Diet - low sodium heart healthy      Increase activity slowly          Medication List     As of 10/13/2012 11:35 AM    STOP taking these medications         clonazePAM 1 MG tablet   Commonly known as: KLONOPIN      ibuprofen 200 MG tablet   Commonly known as: ADVIL,MOTRIN      TAKE these medications         amLODipine-benazepril 5-20 MG per capsule   Commonly known as: LOTREL   Take 1 capsule by mouth 2 (two) times daily.      aspirin EC 81 MG tablet   Take 81 mg by mouth daily.      atorvastatin 20 MG tablet   Commonly known as: LIPITOR   Take 20 mg by mouth daily.  CENTRUM SILVER PO   Take 0.5 tablets by mouth 2 (two) times daily.      cholecalciferol 1000 UNITS tablet   Commonly known as: VITAMIN D   Take 1,000 Units by mouth daily.      cloNIDine 0.1 mg/24hr patch   Commonly known as: CATAPRES - Dosed in mg/24 hr   Place 1 patch (0.1 mg total) onto the skin once a week.      clopidogrel 75 MG tablet   Commonly known as: PLAVIX   Take 75 mg by mouth daily.      Co Q 10 100 MG Caps   Take 1 capsule by mouth daily.      cyanocobalamin 2000 MCG tablet   Take 2,000 mcg by mouth 3 (three) times a week. Takes onTuesday, Thursday, and Saturday      folic acid 400 MCG tablet   Commonly known as: FOLVITE   Take 400 mcg by mouth daily.      Krill Oil 300 MG Caps   Take 1  capsule by mouth daily.      loratadine 10 MG tablet   Commonly known as: CLARITIN   Take 10 mg by mouth daily.      LORazepam 1 MG tablet   Commonly known as: ATIVAN   Take 1 tablet (1 mg total) by mouth every 8 (eight) hours.      PARoxetine 20 MG tablet   Commonly known as: PAXIL   Take 20 mg by mouth daily.      Potassium Gluconate 595 MG Caps   Take 1 capsule by mouth daily.      PRENATAL 1 PO   Take 1 tablet by mouth daily.      vitamin B-1 250 MG tablet   Take 125 mg by mouth daily.      vitamin C 500 MG tablet   Commonly known as: ASCORBIC ACID   Take 500 mg by mouth daily.          The results of significant diagnostics from this hospitalization (including imaging, microbiology, ancillary and laboratory) are listed below for reference.    Significant Diagnostic Studies: Ct Head Wo Contrast  10/09/2012  *RADIOLOGY REPORT*  Clinical Data: Altered mental status.  Recent falls.  CT HEAD WITHOUT CONTRAST  Technique:  Contiguous axial images were obtained from the base of the skull through the vertex without contrast.  Comparison: None.  Findings: There is no evidence for acute infarction, intracranial hemorrhage, mass lesion, hydrocephalus, or extra-axial fluid. Moderate atrophy.  Chronic microvascular ischemic change. Calvarium intact.  Vascular calcification with cavernous carotid stent.  No sinus or mastoid disease.  IMPRESSION: Atrophy with chronic microvascular ischemic change.  No acute intracranial abnormality.   Original Report Authenticated By: Davonna Belling, M.D.    Mr Brain Wo Contrast  10/10/2012  *RADIOLOGY REPORT*  Clinical Data: Altered mental status.  MRI HEAD WITHOUT CONTRAST  Technique:  Multiplanar, multiecho pulse sequences of the brain and surrounding structures were obtained according to standard protocol without intravenous contrast.  Comparison: 10/09/2012 CT. 09/23/2009 and11/23/2011 MR.  Findings: Exam limited by motion degradation.  The patient  was attempting to remote head coil.  Fast technique imaging utilized.  No obvious infarct on motion degraded diffusion sequence.  Hemorrhagic breakdown products right hemisphere involving right frontal lobe, opercular region and right temporal lobe consistent with prior hemorrhage and noted on 2010 exam.  No obvious new intracranial hemorrhage.  Global atrophy.  Ventricular prominence slightly asymmetric greater on the  right and may be related to atrophy rather hydrocephalus.  Remote small right caudate head infarct.  No obvious mass lesion noted on this motion degraded unenhanced exam.  Right internal carotid artery is narrowed.  Occluded right vertebral artery.  IMPRESSION: Motion degraded exam without evidence of obvious acute infarct. Please see above.   Original Report Authenticated By: Lacy Duverney, M.D.    Dg Abd Acute W/chest  10/09/2012  *RADIOLOGY REPORT*  Clinical Data: Nausea, vomiting, diarrhea  ACUTE ABDOMEN SERIES (ABDOMEN 2 VIEW & CHEST 1 VIEW)  Comparison: 09/25/2010  Findings: Low volume exam with prominent vascular and interstitial markings.  Minimal basilar atelectasis.  No CHF or pneumonia.  No effusion or pneumothorax.  Right shoulder hemiarthroplasty noted.  No free air evident.  Mild gaseous distention of small and large bowel.  Transverse colon and splenic flexure are air distended. Appearance is nonspecific.  No definite obstruction.  Ileus could have this appearance.  There are scattered air fluid levels on the upright exam.  Degenerative and postop changes of the spine. Atherosclerosis of the aorta and iliac vessels.  IMPRESSION: Low volume chest exam.  No free air  Gaseous distention of small and large bowel, nonspecific. Scattered air fluid levels.  This can be seen with ongoing diarrhea versus mild ileus.  No definite obstruction.   Original Report Authenticated By: Judie Petit. Shick, M.D.      Labs: Basic Metabolic Panel:  Lab 10/10/12 9604 10/09/12 1244  NA 138 137  K 4.0 3.3*    CL 102 100  CO2 25 27  GLUCOSE 89 94  BUN 3* 4*  CREATININE 0.43* 0.43*  CALCIUM 8.2* 8.7  MG -- --  PHOS -- --   Liver Function Tests:  Lab 10/10/12 0526 10/09/12 1244  AST 191* 208*  ALT 82* 85*  ALKPHOS 202* 222*  BILITOT 0.8 0.9  PROT 5.8* 6.1  ALBUMIN 3.0* 3.2*    Lab 10/09/12 1244  LIPASE 24  AMYLASE --    CBC:  Lab 10/10/12 0526 10/09/12 1244  WBC 5.6 6.1  NEUTROABS -- 4.2  HGB 12.0* 12.7*  HCT 34.4* 35.7*  MCV 94.2 92.0  PLT 135* 141*        Signed:  Elvie Maines C  [facility] 10/13/2012, 11:35 AM

## 2012-10-13 NOTE — Progress Notes (Signed)
Patient with orders to be discharge home. Discharge instructions given to patient and to patient's daughter, verbalized understanding via teach back method. Patient in stable condition upon discharge. Patient left with daughter via private vehicle.

## 2012-10-13 NOTE — Progress Notes (Signed)
Physical Therapy Treatment Patient Details Name: Roy [name] MRN: [id number] DOB: 12/01/1945 Today's Date: 10/13/2012 Time: 1219-1230 PT Time Calculation (min): 11 min 1 gt  PT Assessment / Plan / Recommendation Comments on Treatment Session  Patient was able to complete 110' of gait training with RW;Max A due to severe ataxia and impulsivness. Family and patient instructed for him to have assistance at all times to remain safe and avoid falls.Case Mgmt will need to order patient RW.                              Mobility  Bed Mobility Bed Mobility: Supine to Sit Supine to Sit: 6: Modified independent (Device/Increase time) Transfers Sit to Stand: 5: Supervision Stand to Sit: 4: Min guard Ambulation/Gait Ambulation/Gait Assistance: 2: Max Environmental consultant (Feet): 110 Feet Assistive device: Rolling walker Ambulation/Gait Assistance Details: Patient very impulsive and ataxic with strong lateral lean to right;needing Max A at times to maintain balance Gait Pattern: Lateral trunk lean to right;Ataxic Gait velocity: quick General Gait Details: Patient will require Max A even with RW due to severe ataxia        Roy [name] 10/13/2012, 12:43 PM

## 2012-11-06 DIAGNOSIS — K7689 Other specified diseases of liver: Secondary | ICD-10-CM | POA: Diagnosis not present

## 2012-11-06 DIAGNOSIS — I6789 Other cerebrovascular disease: Secondary | ICD-10-CM | POA: Diagnosis not present

## 2012-11-06 DIAGNOSIS — E785 Hyperlipidemia, unspecified: Secondary | ICD-10-CM | POA: Diagnosis not present

## 2012-11-06 DIAGNOSIS — I10 Essential (primary) hypertension: Secondary | ICD-10-CM | POA: Diagnosis not present

## 2012-11-06 DIAGNOSIS — Z23 Encounter for immunization: Secondary | ICD-10-CM | POA: Diagnosis not present

## 2012-11-13 ENCOUNTER — Encounter: Payer: Self-pay | Admitting: *Deleted

## 2013-01-13 ENCOUNTER — Emergency Department (HOSPITAL_COMMUNITY)
Admission: EM | Admit: 2013-01-13 | Discharge: 2013-01-13 | Discharge status: Against Medical Advice | Payer: Medicare Other | Attending: Emergency Medicine | Admitting: Emergency Medicine

## 2013-01-13 ENCOUNTER — Emergency Department (HOSPITAL_COMMUNITY): Payer: Medicare Other

## 2013-01-13 ENCOUNTER — Encounter (HOSPITAL_COMMUNITY): Payer: Self-pay

## 2013-01-13 DIAGNOSIS — S59919A Unspecified injury of unspecified forearm, initial encounter: Secondary | ICD-10-CM | POA: Diagnosis not present

## 2013-01-13 DIAGNOSIS — W1809XA Striking against other object with subsequent fall, initial encounter: Secondary | ICD-10-CM | POA: Insufficient documentation

## 2013-01-13 DIAGNOSIS — Y929 Unspecified place or not applicable: Secondary | ICD-10-CM | POA: Insufficient documentation

## 2013-01-13 DIAGNOSIS — F17200 Nicotine dependence, unspecified, uncomplicated: Secondary | ICD-10-CM | POA: Insufficient documentation

## 2013-01-13 DIAGNOSIS — J449 Chronic obstructive pulmonary disease, unspecified: Secondary | ICD-10-CM | POA: Insufficient documentation

## 2013-01-13 DIAGNOSIS — S59909A Unspecified injury of unspecified elbow, initial encounter: Secondary | ICD-10-CM | POA: Insufficient documentation

## 2013-01-13 DIAGNOSIS — Y939 Activity, unspecified: Secondary | ICD-10-CM | POA: Insufficient documentation

## 2013-01-13 DIAGNOSIS — I10 Essential (primary) hypertension: Secondary | ICD-10-CM | POA: Diagnosis not present

## 2013-01-13 DIAGNOSIS — J4489 Other specified chronic obstructive pulmonary disease: Secondary | ICD-10-CM | POA: Insufficient documentation

## 2013-01-13 DIAGNOSIS — W010XXA Fall on same level from slipping, tripping and stumbling without subsequent striking against object, initial encounter: Secondary | ICD-10-CM | POA: Insufficient documentation

## 2013-01-13 DIAGNOSIS — Z79899 Other long term (current) drug therapy: Secondary | ICD-10-CM | POA: Diagnosis not present

## 2013-01-13 DIAGNOSIS — S6990XA Unspecified injury of unspecified wrist, hand and finger(s), initial encounter: Secondary | ICD-10-CM | POA: Insufficient documentation

## 2013-01-13 NOTE — ED Notes (Signed)
Pt slipped and fell on ice yesterday injured left wrist, denies any other injury.

## 2013-01-13 NOTE — ED Notes (Signed)
Pt  And family member came to desk and left ama.  Say they cannot wait any longer.  Loney Laurence advised pt that he was the next to be seen, but he would not wait.

## 2013-01-13 NOTE — ED Notes (Signed)
Pt wearing splint that a family member gave him to wear, strong odor of etoh on pts breath.

## 2013-01-19 DIAGNOSIS — M79609 Pain in unspecified limb: Secondary | ICD-10-CM | POA: Diagnosis not present

## 2013-04-08 DIAGNOSIS — I10 Essential (primary) hypertension: Secondary | ICD-10-CM | POA: Diagnosis not present

## 2013-04-08 DIAGNOSIS — Z8679 Personal history of other diseases of the circulatory system: Secondary | ICD-10-CM | POA: Diagnosis not present

## 2013-04-08 DIAGNOSIS — E785 Hyperlipidemia, unspecified: Secondary | ICD-10-CM | POA: Diagnosis not present

## 2013-04-15 DIAGNOSIS — S52599A Other fractures of lower end of unspecified radius, initial encounter for closed fracture: Secondary | ICD-10-CM | POA: Diagnosis not present

## 2013-04-15 DIAGNOSIS — Z5320 Procedure and treatment not carried out because of patient's decision for unspecified reasons: Secondary | ICD-10-CM | POA: Diagnosis not present

## 2013-04-16 DIAGNOSIS — S52599A Other fractures of lower end of unspecified radius, initial encounter for closed fracture: Secondary | ICD-10-CM | POA: Diagnosis not present

## 2013-04-30 DIAGNOSIS — S52599A Other fractures of lower end of unspecified radius, initial encounter for closed fracture: Secondary | ICD-10-CM | POA: Diagnosis not present

## 2013-05-07 DIAGNOSIS — S52599A Other fractures of lower end of unspecified radius, initial encounter for closed fracture: Secondary | ICD-10-CM | POA: Diagnosis not present

## 2013-05-19 ENCOUNTER — Other Ambulatory Visit: Payer: Self-pay

## 2013-05-21 ENCOUNTER — Encounter (HOSPITAL_COMMUNITY): Payer: Self-pay | Admitting: *Deleted

## 2013-05-21 ENCOUNTER — Emergency Department (HOSPITAL_COMMUNITY)
Admission: EM | Admit: 2013-05-21 | Discharge: 2013-05-21 | Disposition: A | Discharge status: Home / Self Care | Payer: Medicare Other | Attending: Emergency Medicine | Admitting: Emergency Medicine

## 2013-05-21 DIAGNOSIS — E785 Hyperlipidemia, unspecified: Secondary | ICD-10-CM | POA: Insufficient documentation

## 2013-05-21 DIAGNOSIS — Z8719 Personal history of other diseases of the digestive system: Secondary | ICD-10-CM | POA: Insufficient documentation

## 2013-05-21 DIAGNOSIS — F17200 Nicotine dependence, unspecified, uncomplicated: Secondary | ICD-10-CM | POA: Insufficient documentation

## 2013-05-21 DIAGNOSIS — I10 Essential (primary) hypertension: Secondary | ICD-10-CM | POA: Diagnosis not present

## 2013-05-21 DIAGNOSIS — X58XXXA Exposure to other specified factors, initial encounter: Secondary | ICD-10-CM | POA: Insufficient documentation

## 2013-05-21 DIAGNOSIS — J449 Chronic obstructive pulmonary disease, unspecified: Secondary | ICD-10-CM | POA: Insufficient documentation

## 2013-05-21 DIAGNOSIS — S51809A Unspecified open wound of unspecified forearm, initial encounter: Secondary | ICD-10-CM | POA: Diagnosis not present

## 2013-05-21 DIAGNOSIS — J4489 Other specified chronic obstructive pulmonary disease: Secondary | ICD-10-CM | POA: Insufficient documentation

## 2013-05-21 DIAGNOSIS — Z862 Personal history of diseases of the blood and blood-forming organs and certain disorders involving the immune mechanism: Secondary | ICD-10-CM | POA: Diagnosis not present

## 2013-05-21 DIAGNOSIS — F341 Dysthymic disorder: Secondary | ICD-10-CM | POA: Insufficient documentation

## 2013-05-21 DIAGNOSIS — Z79899 Other long term (current) drug therapy: Secondary | ICD-10-CM | POA: Insufficient documentation

## 2013-05-21 DIAGNOSIS — Z7982 Long term (current) use of aspirin: Secondary | ICD-10-CM | POA: Diagnosis not present

## 2013-05-21 DIAGNOSIS — Y939 Activity, unspecified: Secondary | ICD-10-CM | POA: Insufficient documentation

## 2013-05-21 DIAGNOSIS — Z8739 Personal history of other diseases of the musculoskeletal system and connective tissue: Secondary | ICD-10-CM | POA: Insufficient documentation

## 2013-05-21 DIAGNOSIS — Z8679 Personal history of other diseases of the circulatory system: Secondary | ICD-10-CM | POA: Insufficient documentation

## 2013-05-21 DIAGNOSIS — Y929 Unspecified place or not applicable: Secondary | ICD-10-CM | POA: Insufficient documentation

## 2013-05-21 DIAGNOSIS — S51812A Laceration without foreign body of left forearm, initial encounter: Secondary | ICD-10-CM

## 2013-05-21 DIAGNOSIS — S52599A Other fractures of lower end of unspecified radius, initial encounter for closed fracture: Secondary | ICD-10-CM | POA: Diagnosis not present

## 2013-05-21 MED ORDER — "THROMBI-PAD 3""X3"" EX PADS"
MEDICATED_PAD | CUTANEOUS | Status: AC
  Administered 2013-05-21: 1 via TOPICAL
  Filled 2013-05-21: qty 1

## 2013-05-21 MED ORDER — "THROMBI-PAD 3""X3"" EX PADS": 1.0000 | MEDICATED_PAD | Freq: Once | CUTANEOUS | Status: AC

## 2013-05-21 NOTE — ED Notes (Signed)
Had cast removed from left forearm at 0830 this morning.  Had skin tear prior to cast being removed.  States arm started bleeding today when cast was removed and has not stopped.  Reports has changed bandage x 3 today.  Dressing saturated at this time.  Pt reports taking plavix and ASA.

## 2013-05-21 NOTE — ED Provider Notes (Signed)
History    This chart was scribed for Ward Givens, MD by Donne Anon, ED Scribe. This patient was seen in room APA16A/APA16A and the patient's care was started at 1855.  CSN: [id number] Arrival date & time 05/21/13  1751  First MD Initiated Contact with Patient 05/21/13 1855     Chief Complaint  Patient presents with  . Arm Pain    The history is provided by the patient. No language interpreter was used.   HPI Comments: Roy [name] is a 67 y.o. male who presents to the Emergency Department complaining of left arm wound bleeding that began this morning. He broke his arm 4 weeks ago. He states he had previously put a fly swatter down his cast to itch his arm and it tore a hole in his skin. However, when the wound was in the cast there was minimal bleeding. He was scheduled to have his cast removed this morning, and once the cast was removed it began bleeding and did not stop. He states he has changed the dressing 4x today. He currently takes Plavix and ASA.  PCP Dr Hughie Closs Dr. Amanda Pea is his orthopedist.   Past Medical History  Diagnosis Date  . Cerebrovascular disease 2009    TIA; 2009- right ICA stent; re-intervention for restenosis complicated by [facility] w/o sx  . Degenerative joint disease     Total shoulder arthroplasty-right  . LBBB (left bundle branch block)     Normal echo-2011; stress nuclear in 09/2010--septal hypoperfusion representing nontransmural infarction or the effect of left bundle branch block, no ischemia  . Hyperlipidemia   . Hypertension   . Chronic obstructive pulmonary disease   . Tobacco abuse     -100 pack years; 1.5 packs per day  . Anxiety and depression   . Alcohol abuse     6 beers per day; hospital admission in 2009 for withdrawal symptoms  . Thrombocytopenia   . Tubular adenoma of colon    Past Surgical History  Procedure Laterality Date  . Lumbar fusion  2010  . Vasectomy  1971  . Colonoscopy w/ polypectomy  2011  . Total shoulder  arthroplasty  2011    Right-Dr. Ave Filter  . Colonoscopy  08/22/09    Fields-(Tubular Adenoma)3-mm transverse polyp/4-mm polyp otherwise noraml/small internal hemorrhoids   Family History  Problem Relation Age of Onset  . Prostate cancer Brother   . Coronary artery disease Mother   . Alzheimer's disease Sister    History  Substance Use Topics  . Smoking status: Current Every Day Smoker -- 1.00 packs/day for 60 years    Types: Cigarettes  . Smokeless tobacco: Never Used  . Alcohol Use: Yes     Comment: 12 pack in a week (1/2 can of beer per day) x 60years  lives at home  Review of Systems  Skin: Positive for wound.  All other systems reviewed and are negative.    Allergies  Review of patient's allergies indicates no known allergies.  Home Medications   Current Outpatient Rx  Name  Route  Sig  Dispense  Refill  . amLODipine-benazepril (LOTREL) 5-20 MG per capsule   Oral   Take 1 capsule by mouth 2 (two) times daily.          [name] aspirin EC 81 MG tablet   Oral   Take 81 mg by mouth daily.         [name] atorvastatin (LIPITOR) 20 MG tablet   Oral   Take  20 mg by mouth daily.         . cholecalciferol (VITAMIN D) 1000 UNITS tablet   Oral   Take 1,000 Units by mouth daily.         . clopidogrel (PLAVIX) 75 MG tablet   Oral   Take 75 mg by mouth daily.           . Coenzyme Q10 (CO Q 10) 100 MG CAPS   Oral   Take 1 capsule by mouth daily.         . cyanocobalamin 2000 MCG tablet   Oral   Take 2,000 mcg by mouth 3 (three) times a week. Takes onTuesday, Thursday, and Saturday         . folic acid (FOLVITE) 400 MCG tablet   Oral   Take 400 mcg by mouth daily.         Boris Lown Oil 300 MG CAPS   Oral   Take 1 capsule by mouth daily.         [name] loratadine (CLARITIN) 10 MG tablet   Oral   Take 10 mg by mouth daily.         [name] LORazepam (ATIVAN) 1 MG tablet   Oral   Take 1 tablet (1 mg total) by mouth every 8 (eight) hours.   30 tablet   0   .  Multiple Vitamins-Minerals (CENTRUM SILVER PO)   Oral   Take 0.5 tablets by mouth 2 (two) times daily.          [name] PARoxetine (PAXIL) 20 MG tablet   Oral   Take 20 mg by mouth daily.         . Potassium Gluconate 595 MG CAPS   Oral   Take 1 capsule by mouth daily.         . Prenatal MV-Min-Fe Fum-FA-DHA (PRENATAL 1 PO)   Oral   Take 1 tablet by mouth daily.         . Thiamine HCl (VITAMIN B-1) 250 MG tablet   Oral   Take 125 mg by mouth daily.          . vitamin C (ASCORBIC ACID) 500 MG tablet   Oral   Take 500 mg by mouth daily.           . cloNIDine (CATAPRES - DOSED IN MG/24 HR) 0.1 mg/24hr patch   Transdermal   Place 1 patch (0.1 mg total) onto the skin once a week.   4 patch   0    BP 155/74  Pulse 95  Temp(Src) 97.7 F (36.5 C) (Oral)  Resp 24  Ht 5\' 6"  (1.676 m)  Wt 146 lb (66.225 kg)  BMI 23.58 kg/m2  SpO2 99%  Vital signs normal    Physical Exam  Nursing note and vitals reviewed. Constitutional: He is oriented to person, place, and time. He appears well-developed and well-nourished.  Non-toxic appearance. He does not appear ill. No distress.  HENT:  Head: Normocephalic and atraumatic.  Right Ear: External ear normal.  Left Ear: External ear normal.  Nose: Nose normal. No mucosal edema or rhinorrhea.  Mouth/Throat: Mucous membranes are normal. No dental abscesses or edematous.  Eyes: Conjunctivae and EOM are normal. Pupils are equal, round, and reactive to light.  Neck: Normal range of motion and full passive range of motion without pain. Neck supple.  Pulmonary/Chest: Effort normal. No respiratory distress. He has no rhonchi. He exhibits no crepitus.  Abdominal: Normal appearance.  Musculoskeletal:  Normal range of motion. He exhibits no edema and no tenderness.       Arms: Moves all extremities well.   Neurological: He is alert and oriented to person, place, and time. He has normal strength. No cranial nerve deficit.  Skin: Skin is  warm, dry and intact. No rash noted. No erythema. No pallor.  Linear deep abrasion on the proximal radial aspect of forearm that was 2.5-3 cm in length and it is about 1/2 cm wide.  It had a blood clot over it and and was not actively bleeding when we changed the dressing, but the prior dressing that we removed was soaked in blood.  Psychiatric: He has a normal mood and affect. His speech is normal and behavior is normal. His mood appears not anxious.    ED Course  Procedures (including critical care time) DIAGNOSTIC STUDIES: Oxygen Saturation is 99% on RA, normal by my interpretation.    COORDINATION OF CARE: 7:16 PM Discussed treatment plan which includes dressing the wound with a thrombi-pad, then with 4x4 and Kerlex and Coban dressing with pt at bedside and pt agreed to plan.    Recheck at discharge, no bleeding seen through the dressing.   1. Skin tear of forearm without complication, left, initial encounter     Plan discharge  Devoria Albe, MD, FACEP   MDM  I personally performed the services described in this documentation, which was scribed in my presence. The recorded information has been reviewed and considered.  Devoria Albe, MD, Armando Gang    Ward Givens, MD 05/21/13 2024

## 2013-05-21 NOTE — ED Notes (Signed)
Pressure dressing applied in triage

## 2013-05-21 NOTE — ED Notes (Signed)
Pt with skin tear since cast to left forearm was taken off today per pt, continued to bleed, states on plavix and 81mg  ASA everyday

## 2013-05-21 NOTE — ED Notes (Signed)
MD at bedside. 

## 2013-06-11 DIAGNOSIS — S52599A Other fractures of lower end of unspecified radius, initial encounter for closed fracture: Secondary | ICD-10-CM | POA: Diagnosis not present

## 2013-07-24 ENCOUNTER — Other Ambulatory Visit: Payer: Self-pay | Admitting: Cardiology

## 2013-09-01 DIAGNOSIS — H26019 Infantile and juvenile cortical, lamellar, or zonular cataract, unspecified eye: Secondary | ICD-10-CM | POA: Diagnosis not present

## 2013-09-01 DIAGNOSIS — H2510 Age-related nuclear cataract, unspecified eye: Secondary | ICD-10-CM | POA: Diagnosis not present

## 2013-09-14 DIAGNOSIS — S52599A Other fractures of lower end of unspecified radius, initial encounter for closed fracture: Secondary | ICD-10-CM | POA: Diagnosis not present

## 2013-10-22 ENCOUNTER — Other Ambulatory Visit: Payer: Self-pay | Admitting: Cardiovascular Disease

## 2013-11-05 DIAGNOSIS — Z23 Encounter for immunization: Secondary | ICD-10-CM | POA: Diagnosis not present

## 2013-11-21 ENCOUNTER — Other Ambulatory Visit: Payer: Self-pay | Admitting: Cardiovascular Disease

## 2013-12-21 DIAGNOSIS — E782 Mixed hyperlipidemia: Secondary | ICD-10-CM | POA: Diagnosis not present

## 2013-12-21 DIAGNOSIS — I10 Essential (primary) hypertension: Secondary | ICD-10-CM | POA: Diagnosis not present

## 2013-12-23 DIAGNOSIS — Z8679 Personal history of other diseases of the circulatory system: Secondary | ICD-10-CM | POA: Diagnosis not present

## 2013-12-23 DIAGNOSIS — E785 Hyperlipidemia, unspecified: Secondary | ICD-10-CM | POA: Diagnosis not present

## 2013-12-23 DIAGNOSIS — I10 Essential (primary) hypertension: Secondary | ICD-10-CM | POA: Diagnosis not present

## 2013-12-31 ENCOUNTER — Other Ambulatory Visit (HOSPITAL_COMMUNITY): Payer: Self-pay | Admitting: Internal Medicine

## 2013-12-31 DIAGNOSIS — I739 Peripheral vascular disease, unspecified: Secondary | ICD-10-CM

## 2014-01-05 ENCOUNTER — Ambulatory Visit (HOSPITAL_COMMUNITY)
Admission: RE | Admit: 2014-01-05 | Discharge: 2014-01-05 | Disposition: A | Discharge status: Home / Self Care | Payer: Medicare Other | Source: Ambulatory Visit | Attending: Internal Medicine | Admitting: Internal Medicine

## 2014-01-05 ENCOUNTER — Other Ambulatory Visit (HOSPITAL_COMMUNITY): Payer: Self-pay | Admitting: Internal Medicine

## 2014-01-05 DIAGNOSIS — I70219 Atherosclerosis of native arteries of extremities with intermittent claudication, unspecified extremity: Secondary | ICD-10-CM | POA: Insufficient documentation

## 2014-01-05 DIAGNOSIS — E785 Hyperlipidemia, unspecified: Secondary | ICD-10-CM | POA: Insufficient documentation

## 2014-01-05 DIAGNOSIS — I10 Essential (primary) hypertension: Secondary | ICD-10-CM | POA: Diagnosis not present

## 2014-01-05 DIAGNOSIS — I739 Peripheral vascular disease, unspecified: Secondary | ICD-10-CM

## 2014-01-05 DIAGNOSIS — M79609 Pain in unspecified limb: Secondary | ICD-10-CM | POA: Diagnosis not present

## 2014-02-01 ENCOUNTER — Other Ambulatory Visit (HOSPITAL_COMMUNITY): Payer: Self-pay | Admitting: Interventional Radiology

## 2014-02-01 DIAGNOSIS — I771 Stricture of artery: Secondary | ICD-10-CM

## 2014-02-03 ENCOUNTER — Other Ambulatory Visit: Payer: Self-pay | Admitting: Radiology

## 2014-02-03 ENCOUNTER — Ambulatory Visit (HOSPITAL_COMMUNITY)
Admission: RE | Admit: 2014-02-03 | Discharge: 2014-02-03 | Disposition: A | Discharge status: Home / Self Care | Payer: Medicare Other | Source: Ambulatory Visit | Attending: Interventional Radiology | Admitting: Interventional Radiology

## 2014-02-03 DIAGNOSIS — I771 Stricture of artery: Secondary | ICD-10-CM

## 2014-02-03 DIAGNOSIS — I6523 Occlusion and stenosis of bilateral carotid arteries: Secondary | ICD-10-CM

## 2014-02-03 LAB — BASIC METABOLIC PANEL
BUN: 7 mg/dL (ref 6–23)
CO2: 24 meq/L (ref 19–32)
Calcium: 9.3 mg/dL (ref 8.4–10.5)
Chloride: 93 mEq/L — ABNORMAL LOW (ref 96–112)
Creatinine, Ser: 0.45 mg/dL — ABNORMAL LOW (ref 0.50–1.35)
GFR calc Af Amer: >90 mL/min (ref >90)
GFR calc non Af Amer: >90 mL/min (ref >90)
Glucose, Bld: 90 mg/dL (ref 70–99)
Potassium: 4.7 mEq/L (ref 3.7–5.3)
Sodium: 132 mEq/L — ABNORMAL LOW (ref 137–147)

## 2014-02-03 LAB — PLATELET INHIBITION P2Y12: PLATELET FUNCTION P2Y12: 157 [PRU] — AB (ref 194–418)

## 2014-02-08 DIAGNOSIS — J342 Deviated nasal septum: Secondary | ICD-10-CM | POA: Diagnosis not present

## 2014-02-08 DIAGNOSIS — R040 Epistaxis: Secondary | ICD-10-CM | POA: Diagnosis not present

## 2014-02-15 ENCOUNTER — Other Ambulatory Visit (HOSPITAL_COMMUNITY): Payer: Self-pay | Admitting: Interventional Radiology

## 2014-02-15 DIAGNOSIS — I2510 Atherosclerotic heart disease of native coronary artery without angina pectoris: Secondary | ICD-10-CM

## 2014-02-15 DIAGNOSIS — J449 Chronic obstructive pulmonary disease, unspecified: Secondary | ICD-10-CM

## 2014-02-15 DIAGNOSIS — R269 Unspecified abnormalities of gait and mobility: Secondary | ICD-10-CM

## 2014-02-16 ENCOUNTER — Other Ambulatory Visit: Payer: Self-pay | Admitting: *Deleted

## 2014-02-16 DIAGNOSIS — I739 Peripheral vascular disease, unspecified: Secondary | ICD-10-CM

## 2014-02-17 ENCOUNTER — Ambulatory Visit (HOSPITAL_COMMUNITY)
Admission: RE | Admit: 2014-02-17 | Discharge: 2014-02-17 | Disposition: A | Discharge status: Home / Self Care | Payer: Medicare Other | Source: Ambulatory Visit | Attending: Interventional Radiology | Admitting: Interventional Radiology

## 2014-02-17 DIAGNOSIS — J4489 Other specified chronic obstructive pulmonary disease: Secondary | ICD-10-CM | POA: Insufficient documentation

## 2014-02-17 DIAGNOSIS — R269 Unspecified abnormalities of gait and mobility: Secondary | ICD-10-CM

## 2014-02-17 DIAGNOSIS — I771 Stricture of artery: Secondary | ICD-10-CM | POA: Diagnosis not present

## 2014-02-17 DIAGNOSIS — I2510 Atherosclerotic heart disease of native coronary artery without angina pectoris: Secondary | ICD-10-CM | POA: Diagnosis not present

## 2014-02-17 DIAGNOSIS — J449 Chronic obstructive pulmonary disease, unspecified: Secondary | ICD-10-CM

## 2014-02-17 MED ORDER — IOHEXOL 350 MG/ML SOLN
100.0000 mL | Freq: Once | INTRAVENOUS | Status: AC | PRN
  Administered 2014-02-17: 100 mL via INTRAVENOUS

## 2014-02-18 ENCOUNTER — Telehealth: Payer: Self-pay | Admitting: Cardiovascular Disease

## 2014-02-18 ENCOUNTER — Other Ambulatory Visit (HOSPITAL_COMMUNITY): Payer: Self-pay | Admitting: Interventional Radiology

## 2014-02-18 ENCOUNTER — Ambulatory Visit (INDEPENDENT_AMBULATORY_CARE_PROVIDER_SITE_OTHER): Payer: Medicare Other | Admitting: Cardiology

## 2014-02-18 DIAGNOSIS — I6529 Occlusion and stenosis of unspecified carotid artery: Secondary | ICD-10-CM

## 2014-02-18 NOTE — Telephone Encounter (Signed)
Refill to pcp last seen 2012 discharged as prn to practice,refill to Dr.hall

## 2014-02-18 NOTE — Telephone Encounter (Signed)
Received fax refill request  Rx # [id number] Medication:  Atorvastatin 20 mg tablets Qty 30 Sig:  Take one tablet by mouth once a day Physician:  Bronson Ing

## 2014-02-24 ENCOUNTER — Telehealth (HOSPITAL_COMMUNITY): Payer: Self-pay | Admitting: Interventional Radiology

## 2014-02-24 NOTE — Telephone Encounter (Signed)
Called pt's wife cell phone, no answer and no VM.  Also tried to call pt's PCP [name], MD but the office was closed today, will call back tomorrow (02/25/14). JM

## 2014-03-03 ENCOUNTER — Ambulatory Visit (INDEPENDENT_AMBULATORY_CARE_PROVIDER_SITE_OTHER): Payer: Medicare Other | Admitting: Gastroenterology

## 2014-03-03 ENCOUNTER — Encounter: Payer: Self-pay | Admitting: Gastroenterology

## 2014-03-03 ENCOUNTER — Other Ambulatory Visit: Payer: Self-pay | Admitting: Gastroenterology

## 2014-03-03 VITALS — BP 110/66 | HR 75 | Temp 97.6°F | Ht 65.0 in | Wt 155.2 lb

## 2014-03-03 DIAGNOSIS — I6529 Occlusion and stenosis of unspecified carotid artery: Secondary | ICD-10-CM

## 2014-03-03 DIAGNOSIS — K921 Melena: Secondary | ICD-10-CM

## 2014-03-03 DIAGNOSIS — K7030 Alcoholic cirrhosis of liver without ascites: Secondary | ICD-10-CM

## 2014-03-03 DIAGNOSIS — F1020 Alcohol dependence, uncomplicated: Secondary | ICD-10-CM

## 2014-03-03 LAB — CBC WITH DIFFERENTIAL/PLATELET
BASOS ABS: 0 10*3/uL (ref 0.0–0.1)
Basophils Relative: 0 % (ref 0–1)
EOS ABS: 0.3 10*3/uL (ref 0.0–0.7)
EOS PCT: 6 % — AB (ref 0–5)
HCT: 38 % — ABNORMAL LOW (ref 39.0–52.0)
Hemoglobin: 13.2 g/dL (ref 13.0–17.0)
Lymphocytes Relative: 25 % (ref 12–46)
Lymphs Abs: 1.5 10*3/uL (ref 0.7–4.0)
MCH: 32.2 pg (ref 26.0–34.0)
MCHC: 34.7 g/dL (ref 30.0–36.0)
MCV: 92.7 fL (ref 78.0–100.0)
Monocytes Absolute: 0.8 10*3/uL (ref 0.1–1.0)
Monocytes Relative: 13 % — ABNORMAL HIGH (ref 3–12)
NEUTROS ABS: 3.2 10*3/uL (ref 1.7–7.7)
Neutrophils Relative %: 56 % (ref 43–77)
Platelets: 224 10*3/uL (ref 150–400)
RBC: 4.1 MIL/uL — ABNORMAL LOW (ref 4.22–5.81)
RDW: 14.1 % (ref 11.5–15.5)
WBC: 5.8 10*3/uL (ref 4.0–10.5)

## 2014-03-03 LAB — PROTIME-INR
INR: 0.97 (ref <1.50)
Prothrombin Time: 12.8 seconds (ref 11.6–15.2)

## 2014-03-03 LAB — COMPLETE METABOLIC PANEL WITH GFR
ALT: 39 U/L (ref 0–53)
AST: 64 U/L — ABNORMAL HIGH (ref 0–37)
Albumin: 4.1 g/dL (ref 3.5–5.2)
Alkaline Phosphatase: 112 U/L (ref 39–117)
BILIRUBIN TOTAL: 0.4 mg/dL (ref 0.2–1.2)
BUN: 5 mg/dL — AB (ref 6–23)
CALCIUM: 9 mg/dL (ref 8.4–10.5)
CO2: 27 meq/L (ref 19–32)
CREATININE: 0.46 mg/dL — AB (ref 0.50–1.35)
Chloride: 97 mEq/L (ref 96–112)
GLUCOSE: 87 mg/dL (ref 70–99)
Potassium: 4.8 mEq/L (ref 3.5–5.3)
Sodium: 130 mEq/L — ABNORMAL LOW (ref 135–145)
Total Protein: 6.5 g/dL (ref 6.0–8.3)

## 2014-03-03 MED ORDER — PANTOPRAZOLE SODIUM 40 MG PO TBEC: DELAYED_RELEASE_TABLET | ORAL | Status: DC

## 2014-03-03 MED ORDER — TETRACAINE HCL 0.5 % OP SOLN
OPHTHALMIC | Status: AC
  Filled 2014-03-03: qty 2

## 2014-03-03 MED ORDER — NEOMYCIN-POLYMYXIN-DEXAMETH 3.5-10000-0.1 OP SUSP
OPHTHALMIC | Status: AC
  Filled 2014-03-03: qty 5

## 2014-03-03 NOTE — Assessment & Plan Note (Addendum)
ON ASA/PLAVIX-NO PPI AND DAILY ETOH. 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  EGD FRI APR 10 CBC/PT/INR TODAY TODAY

## 2014-03-03 NOTE — Assessment & Plan Note (Signed)
Continues with DAILY ETOH. REPORTEDLY ELEVATED LIVER ENZYMES MOST LIKELY DUE TO ETOH. LAST IMAGING MAR 2015: FATTY LIVER DISEASE. 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  PT NOT INTERESTED IN QUITTING. RUQ U/S/ELASTOGRAPHY IN 6 M0S AFP/LABS TODAY OPV IN 6 MOS

## 2014-03-03 NOTE — Assessment & Plan Note (Addendum)
Continues with DAILY ETOH. REPORTEDLY ELEVATED LIVER ENZYMES MOST LIKELY DUE TO ETOH. LAST IMAGING MAR 2015: FATTY LIVER DISEASE.   PT NOT INTERESTED IN QUITTING. RUQ U/S/ELASTOGRAPHY IN 6 M0S AFP/LABS TODAY OPV IN 6 MOS

## 2014-03-03 NOTE — Progress Notes (Signed)
Subjective:    Patient ID: Roy [name], male    DOB: 1946/06/11, 68 y.o.   MRN: [id number] [name], MD  HPI BEEN ON LIPITOR A WHILE. TAKES ASA/PLAVIX. STAYS COLD ALL THE TIME. BMs: LOOSE STOOLS:2-3X/WEEK(WATERY TO SOFT BLOBS). MAY BE BLACK LIKE SOOT FOR ABOUT A WEEK. RUNS IT'S OVER 2-3 DAYS. ONLY HAPPENED ONCE. MAY TAKE IBUPROFEN: 1-2X/WEEK. NO BC/GOODY POWDERS, OR NAPROXEN/ALEVE.LAST NL FORMED STOOL: BEFORE TCS. NOP PPI. STILL DRINKING ETOH: 8 A DAY(12 OZ-[place] BEST ICE). PT DENIES FEVER, CHILLS, BRBPR, nausea, vomiting, constipation, abd pain, problems swallowing, OR heartburn or indigestion. NO SWELLING IN LEGS OR ABDOMEN. NO YELLOW EYES, CHEST PAIN, OR SKIN. NO pRBCs OR TATTOOS.  Past Medical History  Diagnosis Date  . Cerebrovascular disease 2009    TIA; 2009- right ICA stent; re-intervention for restenosis complicated by [facility] w/o sx  . Degenerative joint disease     Total shoulder arthroplasty-right  . LBBB (left bundle branch block)     Normal echo-2011; stress nuclear in 09/2010--septal hypoperfusion representing nontransmural infarction or the effect of left bundle branch block, no ischemia  . Hyperlipidemia   . Hypertension   . Chronic obstructive pulmonary disease   . Tobacco abuse     -100 pack years; 1.5 packs per day  . Anxiety and depression   . Alcohol abuse     6 beers per day; hospital admission in 2009 for withdrawal symptoms  . Thrombocytopenia   . Tubular adenoma of colon    Past Surgical History  Procedure Laterality Date  . Lumbar fusion  2010  . Vasectomy  1971  . Colonoscopy w/ polypectomy  2011  . Total shoulder arthroplasty  2011    Right-Dr. Tamera Punt  . Colonoscopy  08/22/09    Byanka Landrus-(Tubular Adenoma)3-mm transverse polyp/4-mm polyp otherwise noraml/small internal hemorrhoids   No Known Allergies  Current Outpatient Prescriptions  Medication Sig Dispense Refill  . amLODipine-benazepril (LOTREL) 5-20 MG per capsule Take 1 capsule by  mouth 2 (two) times daily.       [name] aspirin EC 81 MG tablet Take 81 mg by mouth daily.      [name] atorvastatin (LIPITOR) 20 MG tablet TAKE 1 TABLET BY MOUTH ONCE A DAY.  30 tablet  3  . cholecalciferol (VITAMIN D) 1000 UNITS tablet Take 1,000 Units by mouth daily.      . cloNIDine (CATAPRES - DOSED IN MG/24 HR) 0.1 mg/24hr patch Place 1 patch (0.1 mg total) onto the skin once a week.  4 patch  0  . clopidogrel (PLAVIX) 75 MG tablet Take 75 mg by mouth daily.        . Coenzyme Q10 (CO Q 10) 100 MG CAPS Take 1 capsule by mouth daily.      . cyanocobalamin 2000 MCG tablet Take 2,000 mcg by mouth 3 (three) times a week. Takes onTuesday, Thursday, and Saturday      . folic acid (FOLVITE) 366 MCG tablet Take 400 mcg by mouth daily.      [name] Oil 300 MG CAPS Take 1 capsule by mouth daily.      [name] loratadine (CLARITIN) 10 MG tablet Take 10 mg by mouth daily.      [name] LORazepam (ATIVAN) 1 MG tablet Take 1 tablet (1 mg total) by mouth every 8 (eight) hours.  30 tablet  0  . Multiple Vitamins-Minerals (CENTRUM SILVER PO) Take 0.5 tablets by mouth 2 (two) times daily.       [name] PARoxetine (PAXIL) 20  MG tablet Take 20 mg by mouth daily.      . Potassium Gluconate 595 MG CAPS Take 1 capsule by mouth daily.      . Prenatal MV-Min-Fe Fum-FA-DHA (PRENATAL 1 PO) Take 1 tablet by mouth daily.      . Thiamine HCl (VITAMIN B-1) 250 MG tablet Take 125 mg by mouth daily.       . vitamin C (ASCORBIC ACID) 500 MG tablet Take 500 mg by mouth daily.         Family History  Problem Relation Age of Onset  . Prostate cancer Brother   . Coronary artery disease Mother   . Alzheimer's disease Sister    History  Substance Use Topics  . Smoking status: Current Every Day Smoker -- 1.00 packs/day for 60 years    Types: Cigarettes  . Smokeless tobacco: Never Used  . Alcohol Use: Yes             Review of Systems     Objective:   Physical Exam  Vitals reviewed. Constitutional: He is oriented to person, place, and  time. He appears well-nourished. No distress.  Nicotine stained beard and fingernails  HENT:  Head: Normocephalic and atraumatic.  Mouth/Throat: Oropharynx is clear and moist. No oropharyngeal exudate.  Eyes: Pupils are equal, round, and reactive to light. No scleral icterus.  Neck: Normal range of motion. Neck supple.  Cardiovascular: Normal rate, regular rhythm and normal heart sounds.   Pulmonary/Chest: Effort normal and breath sounds normal. No respiratory distress.  Abdominal: Bowel sounds are normal. He exhibits no distension. There is no tenderness.  Musculoskeletal: He exhibits no edema.  Lymphadenopathy:    He has no cervical adenopathy.  Neurological: He is alert and oriented to person, place, and time.  NO FOCAL DEFICITS   Psychiatric:  FLAT AFFECT, NL MOOD           Assessment & Plan:

## 2014-03-03 NOTE — Progress Notes (Signed)
cc'd to pcp 

## 2014-03-03 NOTE — Patient Instructions (Addendum)
GET LABS DRAWN TODAY PLEASE.  SEE YOU ON FRI FOR YOUR UPPER ENDOSCOPY.

## 2014-03-04 NOTE — Progress Notes (Signed)
Reminder in epic °

## 2014-03-05 ENCOUNTER — Encounter (HOSPITAL_COMMUNITY)
Admission: RE | Disposition: A | Discharge status: Home / Self Care | Payer: Self-pay | Source: Ambulatory Visit | Attending: Gastroenterology

## 2014-03-05 ENCOUNTER — Encounter (HOSPITAL_COMMUNITY): Payer: Self-pay | Admitting: *Deleted

## 2014-03-05 ENCOUNTER — Ambulatory Visit (HOSPITAL_COMMUNITY)
Admission: RE | Admit: 2014-03-05 | Discharge: 2014-03-05 | Disposition: A | Discharge status: Home / Self Care | Payer: Medicare Other | Source: Ambulatory Visit | Attending: Gastroenterology | Admitting: Gastroenterology

## 2014-03-05 DIAGNOSIS — K2980 Duodenitis without bleeding: Secondary | ICD-10-CM | POA: Insufficient documentation

## 2014-03-05 DIAGNOSIS — K2970 Gastritis, unspecified, without bleeding: Secondary | ICD-10-CM | POA: Diagnosis not present

## 2014-03-05 DIAGNOSIS — K2990 Gastroduodenitis, unspecified, without bleeding: Secondary | ICD-10-CM

## 2014-03-05 DIAGNOSIS — E785 Hyperlipidemia, unspecified: Secondary | ICD-10-CM | POA: Diagnosis not present

## 2014-03-05 DIAGNOSIS — F411 Generalized anxiety disorder: Secondary | ICD-10-CM | POA: Diagnosis not present

## 2014-03-05 DIAGNOSIS — F17200 Nicotine dependence, unspecified, uncomplicated: Secondary | ICD-10-CM | POA: Diagnosis not present

## 2014-03-05 DIAGNOSIS — Z79899 Other long term (current) drug therapy: Secondary | ICD-10-CM | POA: Diagnosis not present

## 2014-03-05 DIAGNOSIS — K2940 Chronic atrophic gastritis without bleeding: Secondary | ICD-10-CM | POA: Insufficient documentation

## 2014-03-05 DIAGNOSIS — Z7982 Long term (current) use of aspirin: Secondary | ICD-10-CM | POA: Insufficient documentation

## 2014-03-05 DIAGNOSIS — I10 Essential (primary) hypertension: Secondary | ICD-10-CM | POA: Diagnosis not present

## 2014-03-05 DIAGNOSIS — K921 Melena: Secondary | ICD-10-CM | POA: Diagnosis not present

## 2014-03-05 DIAGNOSIS — F3289 Other specified depressive episodes: Secondary | ICD-10-CM | POA: Diagnosis not present

## 2014-03-05 DIAGNOSIS — F329 Major depressive disorder, single episode, unspecified: Secondary | ICD-10-CM | POA: Insufficient documentation

## 2014-03-05 DIAGNOSIS — K449 Diaphragmatic hernia without obstruction or gangrene: Secondary | ICD-10-CM | POA: Insufficient documentation

## 2014-03-05 HISTORY — PX: ESOPHAGOGASTRODUODENOSCOPY: SHX5428

## 2014-03-05 SURGERY — EGD (ESOPHAGOGASTRODUODENOSCOPY)
Anesthesia: Moderate Sedation

## 2014-03-05 MED ORDER — PROMETHAZINE HCL 25 MG/ML IJ SOLN
12.5000 mg | Freq: Once | INTRAMUSCULAR | Status: AC
  Administered 2014-03-05: 12.5 mg via INTRAVENOUS

## 2014-03-05 MED ORDER — SODIUM CHLORIDE 0.9 % IJ SOLN
INTRAMUSCULAR | Status: AC
  Filled 2014-03-05: qty 10

## 2014-03-05 MED ORDER — MIDAZOLAM HCL 5 MG/5ML IJ SOLN
INTRAMUSCULAR | Status: DC | PRN
  Administered 2014-03-05: 2 mg via INTRAVENOUS
  Administered 2014-03-05 (×2): 1 mg via INTRAVENOUS
  Administered 2014-03-05: 2 mg via INTRAVENOUS

## 2014-03-05 MED ORDER — LIDOCAINE VISCOUS 2 % MT SOLN
OROMUCOSAL | Status: DC | PRN
  Administered 2014-03-05: 1 via OROMUCOSAL

## 2014-03-05 MED ORDER — MEPERIDINE HCL 100 MG/ML IJ SOLN
INTRAMUSCULAR | Status: AC
  Filled 2014-03-05: qty 2

## 2014-03-05 MED ORDER — LIDOCAINE VISCOUS 2 % MT SOLN
OROMUCOSAL | Status: AC
  Filled 2014-03-05: qty 15

## 2014-03-05 MED ORDER — MIDAZOLAM HCL 5 MG/5ML IJ SOLN
INTRAMUSCULAR | Status: AC
  Filled 2014-03-05: qty 10

## 2014-03-05 MED ORDER — SODIUM CHLORIDE 0.9 % IV SOLN
INTRAVENOUS | Status: DC
  Administered 2014-03-05: 1000 mL via INTRAVENOUS

## 2014-03-05 MED ORDER — STERILE WATER FOR IRRIGATION IR SOLN
Status: DC | PRN
  Administered 2014-03-05: 12:00:00

## 2014-03-05 MED ORDER — MEPERIDINE HCL 100 MG/ML IJ SOLN
INTRAMUSCULAR | Status: DC | PRN
Start: 2014-03-05 — End: 2014-03-05
  Administered 2014-03-05 (×4): 25 mg via INTRAVENOUS

## 2014-03-05 MED ORDER — PROMETHAZINE HCL 25 MG/ML IJ SOLN
INTRAMUSCULAR | Status: AC
  Filled 2014-03-05: qty 1

## 2014-03-05 NOTE — H&P (View-Only) (Signed)
Subjective:    Patient ID: Roy [name], male    DOB: 1946-02-03, 68 y.o.   MRN: [id number] Roy Cahill, MD  HPI BEEN ON LIPITOR A WHILE. TAKES ASA/PLAVIX. STAYS COLD ALL THE TIME. BMs: LOOSE STOOLS:2-3X/WEEK(WATERY TO SOFT BLOBS). MAY BE BLACK LIKE SOOT FOR ABOUT A WEEK. RUNS IT'S OVER 2-3 DAYS. ONLY HAPPENED ONCE. MAY TAKE IBUPROFEN: 1-2X/WEEK. NO BC/GOODY POWDERS, OR NAPROXEN/ALEVE.LAST NL FORMED STOOL: BEFORE TCS. NOP PPI. STILL DRINKING ETOH: 8 A DAY(12 OZ-[place] BEST ICE). PT DENIES FEVER, CHILLS, BRBPR, nausea, vomiting, constipation, abd pain, problems swallowing, OR heartburn or indigestion. NO SWELLING IN LEGS OR ABDOMEN. NO YELLOW EYES, CHEST PAIN, OR SKIN. NO pRBCs OR TATTOOS.  Past Medical History  Diagnosis Date  . Cerebrovascular disease 2009    TIA; 2009- right ICA stent; re-intervention for restenosis complicated by [facility] w/o sx  . Degenerative joint disease     Total shoulder arthroplasty-right  . LBBB (left bundle branch block)     Normal echo-2011; stress nuclear in 09/2010--septal hypoperfusion representing nontransmural infarction or the effect of left bundle branch block, no ischemia  . Hyperlipidemia   . Hypertension   . Chronic obstructive pulmonary disease   . Tobacco abuse     -100 pack years; 1.5 packs per day  . Anxiety and depression   . Alcohol abuse     6 beers per day; hospital admission in 2009 for withdrawal symptoms  . Thrombocytopenia   . Tubular adenoma of colon    Past Surgical History  Procedure Laterality Date  . Lumbar fusion  2010  . Vasectomy  1971  . Colonoscopy w/ polypectomy  2011  . Total shoulder arthroplasty  2011    Right-Dr. Tamera Punt  . Colonoscopy  08/22/09    Sheyenne Konz-(Tubular Adenoma)3-mm transverse polyp/4-mm polyp otherwise noraml/small internal hemorrhoids   No Known Allergies  Current Outpatient Prescriptions  Medication Sig Dispense Refill  . amLODipine-benazepril (LOTREL) 5-20 MG per capsule Take 1 capsule by  mouth 2 (two) times daily.       [name] aspirin EC 81 MG tablet Take 81 mg by mouth daily.      [name] atorvastatin (LIPITOR) 20 MG tablet TAKE 1 TABLET BY MOUTH ONCE A DAY.  30 tablet  3  . cholecalciferol (VITAMIN D) 1000 UNITS tablet Take 1,000 Units by mouth daily.      . cloNIDine (CATAPRES - DOSED IN MG/24 HR) 0.1 mg/24hr patch Place 1 patch (0.1 mg total) onto the skin once a week.  4 patch  0  . clopidogrel (PLAVIX) 75 MG tablet Take 75 mg by mouth daily.        . Coenzyme Q10 (CO Q 10) 100 MG CAPS Take 1 capsule by mouth daily.      . cyanocobalamin 2000 MCG tablet Take 2,000 mcg by mouth 3 (three) times a week. Takes onTuesday, Thursday, and Saturday      . folic acid (FOLVITE) 378 MCG tablet Take 400 mcg by mouth daily.      Roy [name] Oil 300 MG CAPS Take 1 capsule by mouth daily.      [name] loratadine (CLARITIN) 10 MG tablet Take 10 mg by mouth daily.      [name] LORazepam (ATIVAN) 1 MG tablet Take 1 tablet (1 mg total) by mouth every 8 (eight) hours.  30 tablet  0  . Multiple Vitamins-Minerals (CENTRUM SILVER PO) Take 0.5 tablets by mouth 2 (two) times daily.       [name] PARoxetine (PAXIL) 20  MG tablet Take 20 mg by mouth daily.      . Potassium Gluconate 595 MG CAPS Take 1 capsule by mouth daily.      . Prenatal MV-Min-Fe Fum-FA-DHA (PRENATAL 1 PO) Take 1 tablet by mouth daily.      . Thiamine HCl (VITAMIN B-1) 250 MG tablet Take 125 mg by mouth daily.       . vitamin C (ASCORBIC ACID) 500 MG tablet Take 500 mg by mouth daily.         Family History  Problem Relation Age of Onset  . Prostate cancer Brother   . Coronary artery disease Mother   . Alzheimer's disease Sister    History  Substance Use Topics  . Smoking status: Current Every Day Smoker -- 1.00 packs/day for 60 years    Types: Cigarettes  . Smokeless tobacco: Never Used  . Alcohol Use: Yes             Review of Systems     Objective:   Physical Exam  Vitals reviewed. Constitutional: He is oriented to person, place, and  time. He appears well-nourished. No distress.  Nicotine stained beard and fingernails  HENT:  Head: Normocephalic and atraumatic.  Mouth/Throat: Oropharynx is clear and moist. No oropharyngeal exudate.  Eyes: Pupils are equal, round, and reactive to light. No scleral icterus.  Neck: Normal range of motion. Neck supple.  Cardiovascular: Normal rate, regular rhythm and normal heart sounds.   Pulmonary/Chest: Effort normal and breath sounds normal. No respiratory distress.  Abdominal: Bowel sounds are normal. He exhibits no distension. There is no tenderness.  Musculoskeletal: He exhibits no edema.  Lymphadenopathy:    He has no cervical adenopathy.  Neurological: He is alert and oriented to person, place, and time.  NO FOCAL DEFICITS   Psychiatric:  FLAT AFFECT, NL MOOD           Assessment & Plan:

## 2014-03-05 NOTE — Interval H&P Note (Signed)
History and Physical Interval Note:  03/05/2014 11:30 AM  Roy [name]  has presented today for surgery, with the diagnosis of MELENA  The various methods of treatment have been discussed with the patient and family. After consideration of risks, benefits and other options for treatment, the patient has consented to  Procedure(s) with comments: ESOPHAGOGASTRODUODENOSCOPY (EGD) ([name]) - 11:45 as a surgical intervention .  The patient's history has been reviewed, patient examined, no change in status, stable for surgery.  I have reviewed the patient's chart and labs.  Questions were answered to the patient's satisfaction.     Sandi L Fields

## 2014-03-05 NOTE — Discharge Instructions (Signed)
NO SOURCE FOR YOUR BLACK STOOL WAS IDENTIFIED. YOU HAVE A SMALL HIATAL HERNIA AND A STRICTURE AT THE BOTTOM OF YOUR ESOPHAGUS.  You have gastritis & DUODENITIS DUE TO YOUR USING ASPIRIN, AND alcohol. I biopsied your stomach.   CONTINUE PROTONIX. TAKE 30 MINUTES PRIOR TO MEALS TWICE DAILY.  FOLLOW A LOW FAT DIET. SEE INFO BELOW.  YOUR BIOPSY WILL BE BACK IN 10 DAYS.  YOU NEED An Agile CAPSULE STUDY next week.     UPPER ENDOSCOPY AFTER CARE Read the instructions outlined below and refer to this sheet in the next week. These discharge instructions provide you with general information on caring for yourself after you leave the hospital. While your treatment has been planned according to the most current medical practices available, unavoidable complications occasionally occur. If you have any problems or questions after discharge, call DR. Pammie Chirino, [phone].  ACTIVITY  You may resume your regular activity, but move at a slower pace for the next 24 hours.   Take frequent rest periods for the next 24 hours.   Walking will help get rid of the air and reduce the bloated feeling in your belly (abdomen).   No driving for 24 hours (because of the medicine (anesthesia) used during the test).   You may shower.   Do not sign any important legal documents or operate any machinery for 24 hours (because of the anesthesia used during the test).    NUTRITION  Drink plenty of fluids.   You may resume your normal diet as instructed by your doctor.   Begin with a light meal and progress to your normal diet. Heavy or fried foods are harder to digest and may make you feel sick to your stomach (nauseated).   Avoid alcoholic beverages for 24 hours or as instructed.    MEDICATIONS  You may resume your normal medications.   WHAT YOU CAN EXPECT TODAY  Some feelings of bloating in the abdomen.   Passage of more gas than usual.    IF YOU HAD A BIOPSY TAKEN DURING THE UPPER  ENDOSCOPY:  Eat a soft diet IF YOU HAVE NAUSEA, BLOATING, ABDOMINAL PAIN, OR VOMITING.    FINDING OUT THE RESULTS OF YOUR TEST Not all test results are available during your visit. DR. Oneida Alar WILL CALL YOU WITHIN 7 DAYS OF YOUR PROCEDUE WITH YOUR RESULTS. Do not assume everything is normal if you have not heard from DR. Wrigley Plasencia IN ONE WEEK, CALL HER OFFICE AT [phone].  SEEK IMMEDIATE MEDICAL ATTENTION AND CALL THE OFFICE: [phone] IF:  You have more than a spotting of blood in your stool.   Your belly is swollen (abdominal distention).   You are nauseated or vomiting.   You have a temperature over 101F.   You have abdominal pain or discomfort that is severe or gets worse throughout the day.   Gastritis/DUODENITIS  Gastritis is an inflammation (the body's way of reacting to injury and/or infection) of the stomach. DUODENITIS is an inflammation (the body's way of reacting to injury and/or infection) of the FIRST PART OF THE SMALL INTESTINES. It is often caused by bacterial (germ) infections. It can also be caused BY ASPIRIN, BC/GOODY POWDER'S, (IBUPROFEN) MOTRIN, OR ALEVE (NAPROXEN), chemicals (including alcohol), SPICY FOODS, and medications. This illness may be associated with generalized malaise (feeling tired, not well), UPPER ABDOMINAL STOMACH cramps, and fever. One common bacterial cause of gastritis is an organism known as H. Pylori. This can be treated with antibiotics.     REFLUX  TREATMENT There are a number of medicines used to treat reflux including: Antacids.  ZANTAC Proton-pump inhibitors: PRotonix  HOME CARE INSTRUCTIONS Eat 2-3 hours before going to bed.  Try to reach and maintain a healthy weight. LOSE 10-20 LBS Do not eat just a few very large meals. Instead, eat 4 TO 6 smaller meals throughout the day.  Try to identify foods and beverages that make your symptoms worse, and avoid these.  Avoid tight clothing.  Do not exercise right after  eating.   Low-Fat Diet BREADS, CEREALS, PASTA, RICE, DRIED PEAS, AND BEANS These products are high in carbohydrates and most are low in fat. Therefore, they can be increased in the diet as substitutes for fatty foods. They too, however, contain calories and should not be eaten in excess. Cereals can be eaten for snacks as well as for breakfast.  Include foods that contain fiber (fruits, vegetables, whole grains, and legumes). Research shows that fiber may lower blood cholesterol levels, especially the water-soluble fiber found in fruits, vegetables, oat products, and legumes. FRUITS AND VEGETABLES It is good to eat fruits and vegetables. Besides being sources of fiber, both are rich in vitamins and some minerals. They help you get the daily allowances of these nutrients. Fruits and vegetables can be used for snacks and desserts. MEATS Limit lean meat, chicken, [place], and fish to no more than 6 ounces per day. Beef, Pork, and Lamb Use lean cuts of beef, pork, and lamb. Lean cuts include:  Extra-lean ground beef.  Arm roast.  Sirloin tip.  Center-cut ham.  Round steak.  Loin chops.  Rump roast.  Tenderloin.  Trim all fat off the outside of meats before cooking. It is not necessary to severely decrease the intake of red meat, but lean choices should be made. Lean meat is rich in protein and contains a highly absorbable form of iron. Premenopausal women, in particular, should avoid reducing lean red meat because this could increase the risk for low red blood cells (iron-deficiency anemia).  Chicken and [place] These are good sources of protein. The fat of poultry can be reduced by removing the skin and underlying fat layers before cooking. Chicken and [place] can be substituted for lean red meat in the diet. Poultry should not be fried or covered with high-fat sauces. Fish and Shellfish Fish is a good source of protein. Shellfish contain cholesterol, but they usually are low in saturated fatty  acids. The preparation of fish is important. Like chicken and [place], they should not be fried or covered with high-fat sauces. EGGS Egg whites contain no fat or cholesterol. They can be eaten often. Try 1 to 2 egg whites instead of whole eggs in recipes or use egg substitutes that do not contain yolk.  MILK AND DAIRY PRODUCTS Use skim or 1% milk instead of 2% or whole milk. Decrease whole milk, natural, and processed cheeses. Use nonfat or low-fat (2%) cottage cheese or low-fat cheeses made from vegetable oils. Choose nonfat or low-fat (1 to 2%) yogurt. Experiment with evaporated skim milk in recipes that call for heavy cream. Substitute low-fat yogurt or low-fat cottage cheese for sour cream in dips and salad dressings. Have at least 2 servings of low-fat dairy products, such as 2 glasses of skim (or 1%) milk each day to help get your daily calcium intake.  FATS AND OILS Butterfat, lard, and beef fats are high in saturated fat and cholesterol. These should be avoided.Vegetable fats do not contain cholesterol. AVOID coconut oil, palm  oil, and palm kernel oil, WHICH are very high in saturated fats. These should be limited. These fats are often used in bakery goods, processed foods, popcorn, oils, and nondairy creamers. Vegetable shortenings and some peanut butters contain hydrogenated oils, which are also saturated fats. Read the labels on these foods and check for saturated vegetable oils.  Desirable liquid vegetable oils are corn oil, cottonseed oil, olive oil, canola oil, safflower oil, soybean oil, and sunflower oil. Peanut oil is not as good, but small amounts are acceptable. Buy a heart-healthy tub margarine that has no partially hydrogenated oils in the ingredients. AVOID Mayonnaise and salad dressings often are made from unsaturated fats.  OTHER EATING TIPS Snacks  Most sweets should be limited as snacks. They tend to be rich in calories and fats, and their caloric content outweighs their  nutritional value. Some good choices in snacks are graham crackers, melba toast, soda crackers, bagels (no egg), English muffins, fruits, and vegetables. These snacks are preferable to snack crackers, [place] fries, and chips. Popcorn should be air-popped or cooked in small amounts of liquid vegetable oil.  Desserts Eat fruit, low-fat yogurt, and fruit ices instead of pastries, cake, and cookies. Sherbet, angel food cake, gelatin dessert, frozen low-fat yogurt, or other frozen products that do not contain saturated fat (pure fruit juice bars, frozen ice pops) are also acceptable.   COOKING METHODS Choose those methods that use little or no fat. They include: Poaching.  Braising.  Steaming.  Grilling.  Baking.  Stir-frying.  Broiling.  Microwaving.  Foods can be cooked in a nonstick pan without added fat, or use a nonfat cooking spray in regular cookware. Limit fried foods and avoid frying in saturated fat. Add moisture to lean meats by using water, broth, cooking wines, and other nonfat or low-fat sauces along with the cooking methods mentioned above. Soups and stews should be chilled after cooking. The fat that forms on top after a few hours in the refrigerator should be skimmed off. When preparing meals, avoid using excess salt. Salt can contribute to raising blood pressure in some people.  EATING AWAY FROM HOME Order entres, potatoes, and vegetables without sauces or butter. When meat exceeds the size of a deck of cards (3 to 4 ounces), the rest can be taken home for another meal. Choose vegetable or fruit salads and ask for low-calorie salad dressings to be served on the side. Use dressings sparingly. Limit high-fat toppings, such as bacon, crumbled eggs, cheese, sunflower seeds, and olives. Ask for heart-healthy tub margarine instead of butter.

## 2014-03-05 NOTE — Op Note (Addendum)
[facility] [address] [place], [address]   ENDOSCOPY PROCEDURE REPORT  PATIENT: Roy, [name]  MR#: [id number] BIRTHDATE: 01-02-1946 , 7  yrs. old GENDER: Male  ENDOSCOPIST: Barney Drain, MD REFERRED LF:YBOF Hall, M.D.  PROCEDURE DATE: 03/05/2014 PROCEDURE:   EGD w/ biopsy  INDICATIONS:Melena. MEDICATIONS: Demerol 100 mg IV and Versed 6 mg IV TOPICAL ANESTHETIC:   Viscous Xylocaine , CETACAINE  DESCRIPTION OF PROCEDURE:     Physical exam was performed.  Informed consent was obtained from the patient after explaining the benefits, risks, and alternatives to the procedure.  The patient was connected to the monitor and placed in the left lateral position.  Continuous oxygen was provided by nasal cannula and IV medicine administered through an indwelling cannula.  After administration of sedation, the patients esophagus was intubated and the EG-[id number] ([id number])  endoscope was advanced under direct visualization to the second portion of the duodenum.  The scope was removed slowly by carefully examining the color, texture, anatomy, and integrity of the mucosa on the way out.  The patient was recovered in endoscopy and discharged home in satisfactory condition.   ESOPHAGUS: A stricture was found at the gastroesophageal junction. The stenosis was traversable with the endoscope.   A small hiatal hernia was noted.   STOMACH: Moderate non-erosive gastritis (inflammation) was found in the gastric antrum and on the greater curvature of the gastric body.  Multiple biopsies were performed using cold forceps.   DUODENUM: Mild duodenal inflammation was found in the duodenal bulb.   The duodenal mucosa showed no abnormalities in the 2nd part of the duodenum.  COMPLICATIONS:   None  ENDOSCOPIC IMPRESSION: 1.   Stricture at the gastroesophageal junction 2.   Small hiatal hernia 3.   MODERATE Non-erosive gastritis AND DUODENITIS 4.   NO SOURCE FOR MELENA  IDENTIFIED  RECOMMENDATIONS: [place].  TAKE 30 MINUTES PRIOR TO MEALS TWICE DAILY. FOLLOW A LOW FAT DIET. BIOPSY WILL BE BACK IN 10 DAYS. AGILE CAPSULE STUDY NEXT WEEK. IF CAPSULE WILL PASS THROUGH HIS ESOPHAGUS, PLAN GIVENS STUDY IN 2 WEEKS.   REPEAT EXAM:   _______________________________ Lorrin MaisBarney Drain, MD 03/05/2014 12:28 PM Revised: 03/05/2014 12:28 PM

## 2014-03-08 ENCOUNTER — Encounter: Payer: Self-pay | Admitting: Gastroenterology

## 2014-03-08 NOTE — Telephone Encounter (Addendum)
Message copied by Rodney Langton on Mon Mar 08, 2014 10:43 AM ------      Message from: Danie Binder      Created: Fri Mar 05, 2014 12:25 PM      Agile CAPSULE STUDY next week dx: melena/esophageal stricture.            ------

## 2014-03-08 NOTE — Telephone Encounter (Signed)
I have mailed Roy [name] a letter to contact the office to schedule. He currently has no working phone numbers

## 2014-03-10 ENCOUNTER — Encounter (HOSPITAL_COMMUNITY): Payer: Self-pay | Admitting: Gastroenterology

## 2014-03-15 ENCOUNTER — Other Ambulatory Visit: Payer: Self-pay | Admitting: Gastroenterology

## 2014-03-16 NOTE — Telephone Encounter (Signed)
REVIEWED.  

## 2014-03-18 ENCOUNTER — Ambulatory Visit (HOSPITAL_COMMUNITY)
Admission: RE | Admit: 2014-03-18 | Discharge: 2014-03-18 | Disposition: A | Discharge status: Home / Self Care | Payer: Medicare Other | Source: Ambulatory Visit | Attending: Gastroenterology | Admitting: Gastroenterology

## 2014-03-18 ENCOUNTER — Encounter (HOSPITAL_COMMUNITY)
Admission: RE | Disposition: A | Discharge status: Home / Self Care | Payer: Self-pay | Source: Ambulatory Visit | Attending: Gastroenterology

## 2014-03-18 DIAGNOSIS — K921 Melena: Secondary | ICD-10-CM | POA: Insufficient documentation

## 2014-03-18 HISTORY — PX: AGILE CAPSULE: SHX5420

## 2014-03-18 SURGERY — AGILE CAPSULE

## 2014-03-19 ENCOUNTER — Ambulatory Visit (HOSPITAL_COMMUNITY)
Admission: RE | Admit: 2014-03-19 | Discharge: 2014-03-19 | Disposition: A | Discharge status: Home / Self Care | Payer: Medicare Other | Source: Ambulatory Visit | Attending: Gastroenterology | Admitting: Gastroenterology

## 2014-03-19 DIAGNOSIS — Z0389 Encounter for observation for other suspected diseases and conditions ruled out: Secondary | ICD-10-CM | POA: Insufficient documentation

## 2014-03-19 DIAGNOSIS — R935 Abnormal findings on diagnostic imaging of other abdominal regions, including retroperitoneum: Secondary | ICD-10-CM | POA: Diagnosis not present

## 2014-03-22 ENCOUNTER — Other Ambulatory Visit: Payer: Self-pay | Admitting: Cardiovascular Disease

## 2014-03-22 ENCOUNTER — Other Ambulatory Visit: Payer: Self-pay | Admitting: Gastroenterology

## 2014-03-22 ENCOUNTER — Telehealth: Payer: Self-pay | Admitting: Gastroenterology

## 2014-03-22 ENCOUNTER — Telehealth: Payer: Self-pay | Admitting: Cardiovascular Disease

## 2014-03-22 ENCOUNTER — Encounter (HOSPITAL_COMMUNITY): Payer: Self-pay | Admitting: Gastroenterology

## 2014-03-22 DIAGNOSIS — K7030 Alcoholic cirrhosis of liver without ascites: Secondary | ICD-10-CM

## 2014-03-22 NOTE — Telephone Encounter (Signed)
Received fax refill request  Rx # [id number] Medication:  Atorvastatin 20 mg tablets Qty 30 Sig:  Take one tablet by mouth once a day Physician:  Bronson Ing

## 2014-03-22 NOTE — Telephone Encounter (Signed)
Route to [facility] to set up Capsule Study

## 2014-03-22 NOTE — Telephone Encounter (Signed)
Please call pt. Roy [name] stomach Bx shows gastritis. Roy [name] AGILE CAPSULE STUDY SHOWS THE CAPSULE WILL PASS THROUGH Roy [name] ESOPHAGUS. Roy [name] LIVER TEST AND BLOOD COUNT ARE ESSENTIALLY NORMAL. HE SHOULD COMPLETE Roy [name] GIVENS CAPSULE STUDY. IF IT DOES NOT SHOW A SOURCE FOR Roy [name] BLACK STOOLS  THEN HE WILL NEED A COLONOSCOPY. HE NEEDS AN ULTRASOUND(WITH ELASTOGRAPHY) Dx: ETOH HEPATITIS TO LOOK AT Roy [name] LIVER WITHIN THE NEXT 2 WEEKS.

## 2014-03-22 NOTE — Telephone Encounter (Signed)
Per last note for refill. Needs to be sent to Dr Nevada Crane. Called [facility] and let them know to send to Dr Nevada Crane.

## 2014-03-22 NOTE — Telephone Encounter (Signed)
Pt's daughter is aware of results. 

## 2014-03-22 NOTE — Telephone Encounter (Signed)
ABD U/S is scheduled on Friday May 1st at 8:00 am and GIVENS is scheduled for Tuesday May 5th at 7:30 am an I have mailed his daughter Judeen Hammans the instructions and she is aware of them both

## 2014-03-23 ENCOUNTER — Encounter: Payer: Self-pay | Admitting: Vascular Surgery

## 2014-03-23 ENCOUNTER — Encounter (HOSPITAL_COMMUNITY): Payer: Self-pay | Admitting: Pharmacy Technician

## 2014-03-23 NOTE — Procedures (Signed)
INDICATION: DISTAL ESOPHAGEAL STRICTURE/MELENA.   STUDY: I PERSONALLY REVIEWED FILMS WITH RADIOLOGY. AGILE CAPSULE IN [place] MOST LIKELY SPLENIC FLEXURE. DEFINITELY NOT IN THE ESOPHAGUS.  PLAN: 1. PROCEED WITH GIVENS STUDY.

## 2014-03-24 ENCOUNTER — Other Ambulatory Visit (HOSPITAL_COMMUNITY): Payer: TRICARE For Life (TFL)

## 2014-03-24 ENCOUNTER — Encounter: Payer: TRICARE For Life (TFL) | Admitting: Vascular Surgery

## 2014-03-26 ENCOUNTER — Ambulatory Visit (HOSPITAL_COMMUNITY)
Admission: RE | Admit: 2014-03-26 | Discharge: 2014-03-26 | Disposition: A | Discharge status: Home / Self Care | Payer: Medicare Other | Source: Ambulatory Visit | Attending: Gastroenterology | Admitting: Gastroenterology

## 2014-03-26 DIAGNOSIS — B1920 Unspecified viral hepatitis C without hepatic coma: Secondary | ICD-10-CM | POA: Diagnosis not present

## 2014-03-26 DIAGNOSIS — K7010 Alcoholic hepatitis without ascites: Secondary | ICD-10-CM | POA: Diagnosis not present

## 2014-03-26 DIAGNOSIS — K7030 Alcoholic cirrhosis of liver without ascites: Secondary | ICD-10-CM

## 2014-03-30 ENCOUNTER — Encounter (HOSPITAL_COMMUNITY): Payer: Self-pay | Admitting: *Deleted

## 2014-03-30 ENCOUNTER — Encounter (HOSPITAL_COMMUNITY)
Admission: RE | Disposition: A | Discharge status: Home / Self Care | Payer: Self-pay | Source: Ambulatory Visit | Attending: Gastroenterology

## 2014-03-30 ENCOUNTER — Ambulatory Visit (HOSPITAL_COMMUNITY)
Admission: RE | Admit: 2014-03-30 | Discharge: 2014-03-30 | Disposition: A | Discharge status: Home / Self Care | Payer: Medicare Other | Source: Ambulatory Visit | Attending: Gastroenterology | Admitting: Gastroenterology

## 2014-03-30 DIAGNOSIS — K921 Melena: Secondary | ICD-10-CM | POA: Diagnosis not present

## 2014-03-30 HISTORY — PX: GIVENS CAPSULE STUDY: SHX5432

## 2014-03-30 SURGERY — IMAGING PROCEDURE, GI TRACT, INTRALUMINAL, VIA CAPSULE

## 2014-03-31 MED ORDER — NALOXONE HCL 0.4 MG/ML IJ SOLN
INTRAMUSCULAR | Status: AC
  Filled 2014-03-31: qty 2

## 2014-04-01 ENCOUNTER — Encounter (HOSPITAL_COMMUNITY): Payer: Self-pay | Admitting: Gastroenterology

## 2014-04-03 ENCOUNTER — Telehealth: Payer: Self-pay | Admitting: Gastroenterology

## 2014-04-03 DIAGNOSIS — K7030 Alcoholic cirrhosis of liver without ascites: Secondary | ICD-10-CM

## 2014-04-03 NOTE — Procedures (Signed)
PRE-OPERATIVE DIAGNOSIS:  MELENA  POST-OPERATIVE DIAGNOSIS:  MELENA, RARE SMALL BOWEL EROSION  PROCEDURE:  Procedure(s): GIVENS CAPSULE STUDY  SURGEON:  Surgeon(s): Dorothyann Peng, MD  PATIENT DATA: 140 LBS, 76 IN, WAIST: 41 IN, GASTRIC PASSAGE TIME: 4 m, SB PASSAGE TIME: 2H 90m  RESULTS: LIMITED views of gastric mucosa due to retained contents. OCCASION EROSION. No blood in the stomach. RARE SMALL BOWEL EROSION AT 5% AND 23 % of SB VIEWING PROGRESS. PT and extending to the IC valve (98%). No masses or AVMs. LIMITED VIEWS OF THE COLON DUE TO RETAINED CONTENTS. NO OLD BLOOD OR FRESH BLOOD SEEN IN SMALL BOWEL OR COLON  DIAGNOSIS: MILD NSAID ENTEROPATHY  Plan: 1. CONTINUE ASA/PLAVIX 2. COLONOSCOPY TO COMPLETE EVALUATION OF OBSCURE GI BLEED.

## 2014-04-03 NOTE — Assessment & Plan Note (Signed)
OPV/U/S/LABS Q6 MOS

## 2014-04-03 NOTE — Telephone Encounter (Signed)
PLEASE CALL PT. HIS ULTRASOUND SHOWS MINIMAL TO MODERATE SCARRING IN HIS LIVER. HE SHOULD AVOID ALCOHOL. HIS CAPSULE STUDY DID NTO SHOW A SOURCE FOR HIS BLACK TARRY STOOL. HE NEEDS TO HAVE A COLONOSCOPY, MOVIPREP.

## 2014-04-05 ENCOUNTER — Other Ambulatory Visit: Payer: Self-pay | Admitting: Gastroenterology

## 2014-04-05 DIAGNOSIS — K921 Melena: Secondary | ICD-10-CM

## 2014-04-05 NOTE — Telephone Encounter (Signed)
TCS W/SLF is scheduled on Wednesday May 20th at 9:30 am and his wife is aware, I have put instructions in the mail

## 2014-04-05 NOTE — Telephone Encounter (Signed)
Pt's daughter Pearley Baranek, called. ( She is POA) She was informed of the results and she said it is OK to schedule anytime and call the phone number listed on his chart.

## 2014-04-05 NOTE — Telephone Encounter (Signed)
LM for pt to call

## 2014-04-06 ENCOUNTER — Encounter (HOSPITAL_COMMUNITY): Payer: Self-pay | Admitting: Pharmacy Technician

## 2014-04-14 ENCOUNTER — Ambulatory Visit (HOSPITAL_COMMUNITY)
Admission: RE | Admit: 2014-04-14 | Discharge: 2014-04-14 | Disposition: A | Discharge status: Home / Self Care | Payer: Medicare Other | Source: Ambulatory Visit | Attending: Gastroenterology | Admitting: Gastroenterology

## 2014-04-14 ENCOUNTER — Encounter (HOSPITAL_COMMUNITY)
Admission: RE | Disposition: A | Discharge status: Home / Self Care | Payer: Self-pay | Source: Ambulatory Visit | Attending: Gastroenterology

## 2014-04-14 ENCOUNTER — Encounter (HOSPITAL_COMMUNITY): Payer: Self-pay | Admitting: *Deleted

## 2014-04-14 DIAGNOSIS — Z79899 Other long term (current) drug therapy: Secondary | ICD-10-CM | POA: Diagnosis not present

## 2014-04-14 DIAGNOSIS — F17200 Nicotine dependence, unspecified, uncomplicated: Secondary | ICD-10-CM | POA: Diagnosis not present

## 2014-04-14 DIAGNOSIS — M19019 Primary osteoarthritis, unspecified shoulder: Secondary | ICD-10-CM | POA: Diagnosis not present

## 2014-04-14 DIAGNOSIS — K921 Melena: Secondary | ICD-10-CM | POA: Insufficient documentation

## 2014-04-14 DIAGNOSIS — Z860100 Personal history of colon polyps, unspecified: Secondary | ICD-10-CM | POA: Insufficient documentation

## 2014-04-14 DIAGNOSIS — F329 Major depressive disorder, single episode, unspecified: Secondary | ICD-10-CM | POA: Insufficient documentation

## 2014-04-14 DIAGNOSIS — F3289 Other specified depressive episodes: Secondary | ICD-10-CM | POA: Insufficient documentation

## 2014-04-14 DIAGNOSIS — Z7982 Long term (current) use of aspirin: Secondary | ICD-10-CM | POA: Insufficient documentation

## 2014-04-14 DIAGNOSIS — D649 Anemia, unspecified: Secondary | ICD-10-CM | POA: Diagnosis not present

## 2014-04-14 DIAGNOSIS — F411 Generalized anxiety disorder: Secondary | ICD-10-CM | POA: Insufficient documentation

## 2014-04-14 DIAGNOSIS — E785 Hyperlipidemia, unspecified: Secondary | ICD-10-CM | POA: Diagnosis not present

## 2014-04-14 DIAGNOSIS — Z7902 Long term (current) use of antithrombotics/antiplatelets: Secondary | ICD-10-CM | POA: Insufficient documentation

## 2014-04-14 DIAGNOSIS — J449 Chronic obstructive pulmonary disease, unspecified: Secondary | ICD-10-CM | POA: Diagnosis not present

## 2014-04-14 DIAGNOSIS — Z8673 Personal history of transient ischemic attack (TIA), and cerebral infarction without residual deficits: Secondary | ICD-10-CM | POA: Diagnosis not present

## 2014-04-14 DIAGNOSIS — I10 Essential (primary) hypertension: Secondary | ICD-10-CM | POA: Insufficient documentation

## 2014-04-14 DIAGNOSIS — K648 Other hemorrhoids: Secondary | ICD-10-CM | POA: Insufficient documentation

## 2014-04-14 DIAGNOSIS — K7030 Alcoholic cirrhosis of liver without ascites: Secondary | ICD-10-CM

## 2014-04-14 DIAGNOSIS — J4489 Other specified chronic obstructive pulmonary disease: Secondary | ICD-10-CM | POA: Insufficient documentation

## 2014-04-14 HISTORY — PX: COLONOSCOPY: SHX5424

## 2014-04-14 SURGERY — COLONOSCOPY
Anesthesia: Moderate Sedation

## 2014-04-14 MED ORDER — MIDAZOLAM HCL 5 MG/5ML IJ SOLN
INTRAMUSCULAR | Status: AC
  Filled 2014-04-14: qty 10

## 2014-04-14 MED ORDER — MIDAZOLAM HCL 5 MG/5ML IJ SOLN
INTRAMUSCULAR | Status: DC | PRN
  Administered 2014-04-14 (×2): 2 mg via INTRAVENOUS
  Administered 2014-04-14: 1 mg via INTRAVENOUS
  Administered 2014-04-14 (×2): 2 mg via INTRAVENOUS

## 2014-04-14 MED ORDER — SIMETHICONE 40 MG/0.6ML PO SUSP
ORAL | Status: DC | PRN
  Administered 2014-04-14: 09:00:00

## 2014-04-14 MED ORDER — MEPERIDINE HCL 100 MG/ML IJ SOLN
INTRAMUSCULAR | Status: AC
  Filled 2014-04-14: qty 2

## 2014-04-14 MED ORDER — MEPERIDINE HCL 100 MG/ML IJ SOLN
INTRAMUSCULAR | Status: DC | PRN
  Administered 2014-04-14: 25 mg via INTRAVENOUS
  Administered 2014-04-14: 50 mg via INTRAVENOUS
  Administered 2014-04-14 (×3): 25 mg via INTRAVENOUS

## 2014-04-14 MED ORDER — SODIUM CHLORIDE 0.9 % IV SOLN
INTRAVENOUS | Status: DC
  Administered 2014-04-14: 09:00:00 via INTRAVENOUS

## 2014-04-14 NOTE — H&P (Signed)
Primary Care Physician:  [name], MD Primary Gastroenterologist:  Dr. Oneida Alar  Pre-Procedure History & Physical: HPI:  Roy [name] is a 68 y.o. male here for MELENA/anemia.  Past Medical History  Diagnosis Date  . Cerebrovascular disease 2009    TIA; 2009- right ICA stent; re-intervention for restenosis complicated by [facility] w/o sx  . Degenerative joint disease     Total shoulder arthroplasty-right  . LBBB (left bundle branch block)     Normal echo-2011; stress nuclear in 09/2010--septal hypoperfusion representing nontransmural infarction or the effect of left bundle branch block, no ischemia  . Hyperlipidemia   . Hypertension   . Chronic obstructive pulmonary disease   . Tobacco abuse     -100 pack years; 1.5 packs per day  . Anxiety and depression   . Alcohol abuse     6 beers per day; hospital admission in 2009 for withdrawal symptoms  . Thrombocytopenia   . Tubular adenoma of colon     Past Surgical History  Procedure Laterality Date  . Lumbar fusion  2010  . Vasectomy  1971  . Colonoscopy w/ polypectomy  2011  . Total shoulder arthroplasty  2011    Right-Dr. Tamera Punt  . Colonoscopy  08/22/09    Dana Dorner-(Tubular Adenoma)3-mm transverse polyp/4-mm polyp otherwise noraml/small internal hemorrhoids  . Esophagogastroduodenoscopy [name] 03/05/2014    Procedure: ESOPHAGOGASTRODUODENOSCOPY (EGD);  Surgeon: Danie Binder, MD;  [facility];  Service: Endoscopy;  Laterality: [name];  11:45  . Agile capsule [name] 03/18/2014    Procedure: AGILE CAPSULE;  Surgeon: Danie Binder, MD;  [facility];  Service: Endoscopy;  Laterality: [name];  7:30  . Givens capsule study [name] 03/30/2014    Procedure: GIVENS CAPSULE STUDY;  Surgeon: Danie Binder, MD;  [facility];  Service: Endoscopy;  Laterality: [name];  7:30    Prior to Admission medications   Medication Sig Start Date End Date Taking? Authorizing Provider  amLODipine-benazepril (LOTREL) 5-20 MG per capsule  Take 1 capsule by mouth 2 (two) times daily.    Yes Historical Provider, MD  aspirin EC 81 MG tablet Take 81 mg by mouth daily.   Yes Historical Provider, MD  atorvastatin (LIPITOR) 20 MG tablet Take 20 mg by mouth at bedtime.   Yes Historical Provider, MD  Chlorphen-Pseudoephed-APAP (TYLENOL ALLERGY COMPLETE PO) Take 1 tablet by mouth daily as needed (allergies).   Yes Historical Provider, MD  cloNIDine (CATAPRES - DOSED IN MG/24 HR) 0.1 mg/24hr patch Place 0.1 mg onto the skin once a week. Monday   Yes Historical Provider, MD  clopidogrel (PLAVIX) 75 MG tablet Take 75 mg by mouth daily.     Yes Historical Provider, MD  Coenzyme Q10 (CO Q 10) 100 MG CAPS Take 1 capsule by mouth daily.   Yes Historical Provider, MD  cyanocobalamin 2000 MCG tablet Take 2,000 mcg by mouth 3 (three) times a week. Takes onTuesday, Thursday, and Saturday   Yes Historical Provider, MD  folic acid (FOLVITE) 347 MCG tablet Take 400 mcg by mouth daily.   Yes Historical Provider, MD  [name] Oil 300 MG CAPS Take 1 capsule by mouth daily.   Yes Historical Provider, MD  loratadine (CLARITIN) 10 MG tablet Take 10 mg by mouth daily.   Yes Historical Provider, MD  Multiple Vitamins-Minerals (CENTRUM SILVER PO) Take 0.5 tablets by mouth 2 (two) times daily.    Yes Historical Provider, MD  pantoprazole (PROTONIX) 40 MG tablet Take 40 mg by mouth  2 (two) times daily.   Yes Historical Provider, MD  Prenatal MV-Min-Fe Fum-FA-DHA (PRENATAL 1 PO) Take 1 tablet by mouth daily.   Yes Historical Provider, MD  Thiamine HCl (VITAMIN B-1) 250 MG tablet Take 125 mg by mouth daily.    Yes Historical Provider, MD  vitamin C (ASCORBIC ACID) 500 MG tablet Take 500 mg by mouth daily.     Yes Historical Provider, MD  LORazepam (ATIVAN) 1 MG tablet Take 1 mg by mouth every 8 (eight) hours as needed for anxiety.    Historical Provider, MD    Allergies as of 04/05/2014  . (No Known Allergies)    Family History  Problem Relation Age of Onset  .  Prostate cancer Brother   . Coronary artery disease Mother   . Diabetes Mother   . Alzheimer's disease Sister   . Alzheimer's disease Father   . Diabetes Father   . Colon cancer Neg Hx     History   Social History  . Marital Status: Married    Spouse Name: [name]    Number of Children: 2  . Years of Education: [name]   Occupational History  . 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     Social History Main Topics  . Smoking status: Current Every Day Smoker -- 1.00 packs/day for 60 years    Types: Cigarettes  . Smokeless tobacco: Never Used  . Alcohol Use: Yes     Comment: 12 pack in a week (1/2 can of beer per day) x 60years  . Drug Use: No  . Sexual Activity: No   Other Topics Concern  . Not on file   Social History Narrative   Accompanied by daughter [name]   Lives w/ wife, daughter    Review of Systems: See HPI, otherwise negative ROS   Physical Exam: BP 147/83  Pulse 112  Temp(Src) 97.9 F (36.6 C) (Oral)  Resp 18  Ht 5\' 6"  (1.676 m)  Wt 140 lb (63.504 kg)  BMI 22.61 kg/m2  SpO2 97% General:   Alert,  pleasant and cooperative in NAD Head:  Normocephalic and atraumatic. Neck:  Supple; Lungs:  Clear throughout to auscultation.    Heart:  Regular rate and rhythm. Abdomen:  Soft, nontender and nondistended. Normal bowel sounds, without guarding, and without rebound.   Neurologic:  Alert and  oriented x4;  grossly normal neurologically.  Impression/Plan:    MELENA/anemia  PLAN: 1. TCS TODAY

## 2014-04-14 NOTE — Discharge Instructions (Signed)
YOU DID NOT HAVE ANY POLYPS.   Next colonoscopy in 10 years.  FOLLOW A HIGH FIBER DIET. AVOID ITEMS THAT CAUSE BLOATING. SEE INFO BELOW.    Colonoscopy Care After Read the instructions outlined below and refer to this sheet in the next week. These discharge instructions provide you with general information on caring for yourself after you leave the hospital. While your treatment has been planned according to the most current medical practices available, unavoidable complications occasionally occur. If you have any problems or questions after discharge, call DR. Lakota Schweppe, [phone].  ACTIVITY  You may resume your regular activity, but move at a slower pace for the next 24 hours.   Take frequent rest periods for the next 24 hours.   Walking will help get rid of the air and reduce the bloated feeling in your belly (abdomen).   No driving for 24 hours (because of the medicine (anesthesia) used during the test).   You may shower.   Do not sign any important legal documents or operate any machinery for 24 hours (because of the anesthesia used during the test).    NUTRITION  Drink plenty of fluids.   You may resume your normal diet as instructed by your doctor.   Begin with a light meal and progress to your normal diet. Heavy or fried foods are harder to digest and may make you feel sick to your stomach (nauseated).   Avoid alcoholic beverages for 24 hours or as instructed.    MEDICATIONS  You may resume your normal medications.   WHAT YOU CAN EXPECT TODAY  Some feelings of bloating in the abdomen.   Passage of more gas than usual.   Spotting of blood in your stool or on the toilet paper  .  IF YOU HAD POLYPS REMOVED DURING THE COLONOSCOPY:  Eat a soft diet IF YOU HAVE NAUSEA, BLOATING, ABDOMINAL PAIN, OR VOMITING.    FINDING OUT THE RESULTS OF YOUR TEST Not all test results are available during your visit. DR. Oneida Alar WILL CALL YOU WITHIN 7 DAYS OF YOUR PROCEDUE  WITH YOUR RESULTS. Do not assume everything is normal if you have not heard from DR. Ailyn Gladd IN ONE WEEK, CALL HER OFFICE AT [phone].  SEEK IMMEDIATE MEDICAL ATTENTION AND CALL THE OFFICE: [phone] IF:  You have more than a spotting of blood in your stool.   Your belly is swollen (abdominal distention).   You are nauseated or vomiting.   You have a temperature over 101F.   You have abdominal pain or discomfort that is severe or gets worse throughout the day.  High-Fiber Diet A high-fiber diet changes your normal diet to include more whole grains, legumes, fruits, and vegetables. Changes in the diet involve replacing refined carbohydrates with unrefined foods. The calorie level of the diet is essentially unchanged. The Dietary Reference Intake (recommended amount) for adult males is 38 grams per day. For adult females, it is 25 grams per day. Pregnant and lactating women should consume 28 grams of fiber per day. Fiber is the intact part of a plant that is not broken down during digestion. Functional fiber is fiber that has been isolated from the plant to provide a beneficial effect in the body. PURPOSE  Increase stool bulk.   Ease and regulate bowel movements.   Lower cholesterol.  INDICATIONS THAT YOU NEED MORE FIBER  Constipation and hemorrhoids.   Uncomplicated diverticulosis (intestine condition) and irritable bowel syndrome.   Weight management.   As a protective measure  against hardening of the arteries (atherosclerosis), diabetes, and cancer.   GUIDELINES FOR INCREASING FIBER IN THE DIET  Start adding fiber to the diet slowly. A gradual increase of about 5 more grams (2 slices of whole-wheat bread, 2 servings of most fruits or vegetables, or 1 bowl of high-fiber cereal) per day is best. Too rapid an increase in fiber may result in constipation, flatulence, and bloating.   Drink enough water and fluids to keep your urine clear or pale yellow. Water, juice, or  caffeine-free drinks are recommended. Not drinking enough fluid may cause constipation.   Eat a variety of high-fiber foods rather than one type of fiber.   Try to increase your intake of fiber through using high-fiber foods rather than fiber pills or supplements that contain small amounts of fiber.   The goal is to change the types of food eaten. Do not supplement your present diet with high-fiber foods, but replace foods in your present diet.  INCLUDE A VARIETY OF FIBER SOURCES  Replace refined and processed grains with whole grains, canned fruits with fresh fruits, and incorporate other fiber sources. White rice, white breads, and most bakery goods contain little or no fiber.   Brown whole-grain rice, buckwheat oats, and many fruits and vegetables are all good sources of fiber. These include: broccoli, Brussels sprouts, cabbage, cauliflower, beets, sweet potatoes, white potatoes (skin on), carrots, tomatoes, eggplant, squash, berries, fresh fruits, and dried fruits.   Cereals appear to be the richest source of fiber. Cereal fiber is found in whole grains and bran. Bran is the fiber-rich outer coat of cereal grain, which is largely removed in refining. In whole-grain cereals, the bran remains. In breakfast cereals, the largest amount of fiber is found in those with "bran" in their names. The fiber content is sometimes indicated on the label.   You may need to include additional fruits and vegetables each day.   In baking, for 1 cup white flour, you may use the following substitutions:   1 cup whole-wheat flour minus 2 tablespoons.   1/2 cup white flour plus 1/2 cup whole-wheat flour.

## 2014-04-14 NOTE — Op Note (Signed)
[facility] [address] [place], [address]   COLONOSCOPY PROCEDURE REPORT  PATIENT: Roy [name], Roy [name]  MR#: [id number] BIRTHDATE: 19-Jul-1946 , 74  yrs. old GENDER: Male ENDOSCOPIST: Barney Drain, MD REFERRED CW:CBJS Hall, M.D. PROCEDURE DATE:  04/14/2014 PROCEDURE:   Colonoscopy, diagnostic INDICATIONS:MELENA/ANEMIA.[name] MEDICATIONS: Demerol 150 mg IV and Versed 9 mg IV  DESCRIPTION OF PROCEDURE:    Physical exam was performed.  Informed consent was obtained from the patient after explaining the benefits, risks, and alternatives to procedure.  The patient was connected to monitor and placed in left lateral position. Continuous oxygen was provided by nasal cannula and IV medicine administered through an indwelling cannula.  After administration of sedation and rectal exam, the patients rectum was intubated and the EC-3890Li ([id number])  colonoscope was advanced under direct visualization to the ileum.  The scope was removed slowly by carefully examining the color, texture, anatomy, and integrity mucosa on the way out.  The patient was recovered in endoscopy and discharged home in satisfactory condition.    COLON FINDINGS: The mucosa appeared normal in the terminal ileum.  , The colon was redundant.  The patient was moved on to their back to reach the cecum, The colon mucosa was otherwise normal.  , and Small internal hemorrhoids were found.  PREP QUALITY: good.  CECAL W/D TIME: 10 minutes    COMPLICATIONS: None  ENDOSCOPIC IMPRESSION: 1.   Normal mucosa in the terminal ileum 2.   The LEFT colon IS redundant 3.   NO SOURCE FOR MELENA IDETIFIED IN THE COLON OR DISTAL ILEUM. 4.   Small internal hemorrhoids   RECOMMENDATIONS: HIGH FIBER DIET TCS IN 10 YEARS       _______________________________ eSignedBarney Drain, MD 04/14/2014 10:11 AM

## 2014-04-15 ENCOUNTER — Encounter (HOSPITAL_COMMUNITY): Payer: Self-pay | Admitting: Gastroenterology

## 2014-04-23 ENCOUNTER — Encounter: Payer: Self-pay | Admitting: Surgery

## 2014-04-26 ENCOUNTER — Encounter: Payer: Self-pay | Admitting: Surgery

## 2014-04-26 ENCOUNTER — Ambulatory Visit (HOSPITAL_COMMUNITY)
Admission: RE | Admit: 2014-04-26 | Discharge: 2014-04-26 | Disposition: A | Discharge status: Home / Self Care | Payer: Medicare Other | Source: Ambulatory Visit | Attending: Surgery | Admitting: Surgery

## 2014-04-26 ENCOUNTER — Ambulatory Visit (INDEPENDENT_AMBULATORY_CARE_PROVIDER_SITE_OTHER): Payer: Medicare Other | Admitting: Surgery

## 2014-04-26 VITALS — BP 140/77 | HR 76 | Ht 66.0 in | Wt 158.0 lb

## 2014-04-26 DIAGNOSIS — I739 Peripheral vascular disease, unspecified: Secondary | ICD-10-CM | POA: Insufficient documentation

## 2014-04-26 NOTE — Progress Notes (Signed)
   Patient name: Roy [name] MRN: [id number] DOB: 07/03/1946 Sex: male   Referred by: Dr. Hall  Reason for referral:  Chief Complaint  Patient presents with  . New Evaluation    claudication     HISTORY OF PRESENT ILLNESS: 68-year-old gentleman who is referred today for evaluation of bilateral leg pain.  The patient states that he has been having worsening pain in his legs for the past 6-8 months.  He denies ulcers or rest pain.  He states that his activity is severely limited.  He can walk approximately several steps before his legs give non-that he feels like he is going to fall.  He had a CT angiogram in March which revealed diffuse disease in his aortoiliac segment as well as his outflow and runoff vessels.  The patient has a significant smoking history with no interest of stopping.  He has a history of stroke as well as a intracranial carotid stent.  He has a history of significant alcohol consumption with hospitalization for withdrawal.  He is medically managed for hypertension.  He is on a statin for hypercholesterolemia.  He is on dual agent antiplatelet therapy with aspirin and Plavix.  Past Medical History  Diagnosis Date  . Cerebrovascular disease 2009    TIA; 2009- right ICA stent; re-intervention for restenosis complicated by [facility] w/o sx  . Degenerative joint disease     Total shoulder arthroplasty-right  . LBBB (left bundle branch block)     Normal echo-2011; stress nuclear in 09/2010--septal hypoperfusion representing nontransmural infarction or the effect of left bundle branch block, no ischemia  . Hyperlipidemia   . Hypertension   . Chronic obstructive pulmonary disease   . Tobacco abuse     -100 pack years; 1.5 packs per day  . Anxiety and depression   . Alcohol abuse     6 beers per day; hospital admission in 2009 for withdrawal symptoms  . Thrombocytopenia   . Tubular adenoma of colon     Past Surgical History  Procedure Laterality Date  . Lumbar  fusion  2010  . Vasectomy  1971  . Colonoscopy w/ polypectomy  2011  . Total shoulder arthroplasty  2011    Right-Dr. Chandler  . Colonoscopy  08/22/09    Fields-(Tubular Adenoma)3-mm transverse polyp/4-mm polyp otherwise noraml/small internal hemorrhoids  . Esophagogastroduodenoscopy [name] 03/05/2014    Procedure: ESOPHAGOGASTRODUODENOSCOPY (EGD);  Surgeon: Sandi L Fields, MD;  [facility];  Service: Endoscopy;  Laterality: [name];  11:45  . Agile capsule [name] 03/18/2014    Procedure: AGILE CAPSULE;  Surgeon: Sandi L Fields, MD;  [facility];  Service: Endoscopy;  Laterality: [name];  7:30  . Givens capsule study [name] 03/30/2014    Procedure: GIVENS CAPSULE STUDY;  Surgeon: Sandi L Fields, MD;  [facility];  Service: Endoscopy;  Laterality: [name];  7:30  . Colonoscopy [name] 04/14/2014    Procedure: COLONOSCOPY;  Surgeon: Sandi L Fields, MD;  [facility];  Service: Endoscopy;  Laterality: [name];  9:30    History   Social History  . Marital Status: Married    Spouse Name: [name]    Number of Children: 2  . Years of Education: [name]   Occupational History  . retired, electrician     electrician  .     Social History Main Topics  . Smoking status: Current Every Day Smoker -- 1.00 packs/day for 60 years    Types: Cigarettes  . Smokeless   tobacco: Never Used  . Alcohol Use: 36.0 oz/week    60 Cans of beer per week     Comment: 12 pack in a week (1/2 can of beer per day) x 60years  . Drug Use: No  . Sexual Activity: No   Other Topics Concern  . Not on file   Social History Narrative   Accompanied by daughter Sherry Belcher   Lives w/ wife, daughter    Family History  Problem Relation Age of Onset  . Prostate cancer Brother   . Coronary artery disease Mother   . Diabetes Mother   . Heart disease Mother   . Hypertension Mother   . Heart attack Mother   . Alzheimer's disease Sister   . Alzheimer's disease Father   . Diabetes Father   . Colon  cancer Neg Hx     Allergies as of 04/26/2014  . (No Known Allergies)    Current Outpatient Prescriptions on File Prior to Visit  Medication Sig Dispense Refill  . amLODipine-benazepril (LOTREL) 5-20 MG per capsule Take 1 capsule by mouth 2 (two) times daily.       . aspirin EC 81 MG tablet Take 81 mg by mouth daily.      . atorvastatin (LIPITOR) 20 MG tablet Take 20 mg by mouth at bedtime.      . Chlorphen-Pseudoephed-APAP (TYLENOL ALLERGY COMPLETE PO) Take 1 tablet by mouth daily as needed (allergies).      . cloNIDine (CATAPRES - DOSED IN MG/24 HR) 0.1 mg/24hr patch Place 0.1 mg onto the skin once a week. Monday      . clopidogrel (PLAVIX) 75 MG tablet Take 75 mg by mouth daily.        . Coenzyme Q10 (CO Q 10) 100 MG CAPS Take 1 capsule by mouth daily.      . cyanocobalamin 2000 MCG tablet Take 2,000 mcg by mouth 3 (three) times a week. Takes onTuesday, Thursday, and Saturday      . folic acid (FOLVITE) 400 MCG tablet Take 400 mcg by mouth daily.      . Krill Oil 300 MG CAPS Take 1 capsule by mouth daily.      . loratadine (CLARITIN) 10 MG tablet Take 10 mg by mouth daily.      . LORazepam (ATIVAN) 1 MG tablet Take 1 mg by mouth every 8 (eight) hours as needed for anxiety.      . Multiple Vitamins-Minerals (CENTRUM SILVER PO) Take 0.5 tablets by mouth 2 (two) times daily.       . pantoprazole (PROTONIX) 40 MG tablet Take 40 mg by mouth 2 (two) times daily.      . Prenatal MV-Min-Fe Fum-FA-DHA (PRENATAL 1 PO) Take 1 tablet by mouth daily.      . Thiamine HCl (VITAMIN B-1) 250 MG tablet Take 125 mg by mouth daily.       . vitamin C (ASCORBIC ACID) 500 MG tablet Take 500 mg by mouth daily.         No current facility-administered medications on file prior to visit.     REVIEW OF SYSTEMS: Cardiovascular: No chest pain, chest pressure, palpitations, positive for shortness of breath on lying flat, shortness of breath with exertion, pain in legs with walking, pain and fever in my clinic,  swelling in legs. Pulmonary: No productive cough, asthma.  Positive for wheezing Neurologic: No weakness, paresthesias, aphasia, or amaurosis. No dizziness. Hematologic: No bleeding problems or clotting disorders. Musculoskeletal: No joint pain or joint   swelling. Gastrointestinal: No blood in stool or hematemesis Genitourinary: Positive for dysuria. Psychiatric:: No history of major depression. Integumentary: No rashes or ulcers. Constitutional: No fever or chills.  PHYSICAL EXAMINATION: General: The patient appears their stated age.  Vital signs are BP 140/77  Pulse 76  Ht 5' 6" (1.676 m)  Wt 158 lb (71.668 kg)  BMI 25.51 kg/m2  SpO2 100% HEENT:  No gross abnormalities Pulmonary: Respirations are non-labored Abdomen: Soft and non-tender  Musculoskeletal: There are no major deformities.   Neurologic: No focal weakness or paresthesias are detected, Skin: There are no ulcer or rashes noted. Psychiatric: The patient has normal affect. Cardiovascular: There is a regular rate and rhythm without significant murmur appreciated.  No carotid bruits.  Palpable femoral pulses.  Pedal pulses are nonpalpable.  Diagnostic Studies: I have reviewed his CT and exam which shows the following:  Aorta and common iliac arteries are non aneurysmal and patent.  Significant narrowing in the right external iliac artery.  50% narrowing in the right and left common femoral arteries.  Multiple areas of significant narrowing in the left superficial  femoral artery. Two-vessel runoff to the left ankle.  Three vessel runoff to the right ankle.  Multiple small pulmonary nodules as described     Assessment:  Bilateral claudication Plan: I feel the patient has significant lifestyle limiting claudication in bilateral lower extremities.  I feel he would benefit from additional imaging with angiography.  At that time I would also consider endovascular treatment with stenting, most likely in his aortoiliac  section.  This is been scheduled for June 9.  The patient will continue his aspirin and Plavix.  I discussed that he may also require surgical revascularization which would not be performed at this time.  Hopefully, his intervention will be limited to the aortoiliac section, however this may require treatment of his superficial femoral/popliteal section     V. Wells Traveion Ruddock IV, M.D. [facility] Office: [phone] Pager:  [phone]    

## 2014-04-27 ENCOUNTER — Other Ambulatory Visit: Payer: Self-pay

## 2014-04-30 ENCOUNTER — Encounter (HOSPITAL_COMMUNITY): Payer: Self-pay | Admitting: Pharmacist

## 2014-05-04 ENCOUNTER — Encounter (HOSPITAL_COMMUNITY)
Admission: RE | Disposition: A | Discharge status: Home / Self Care | Payer: Self-pay | Source: Ambulatory Visit | Attending: Surgery

## 2014-05-04 ENCOUNTER — Ambulatory Visit (HOSPITAL_COMMUNITY)
Admission: RE | Admit: 2014-05-04 | Discharge: 2014-05-04 | Disposition: A | Discharge status: Home / Self Care | Payer: Medicare Other | Source: Ambulatory Visit | Attending: Surgery | Admitting: Surgery

## 2014-05-04 ENCOUNTER — Other Ambulatory Visit: Payer: Self-pay | Admitting: *Deleted

## 2014-05-04 DIAGNOSIS — I10 Essential (primary) hypertension: Secondary | ICD-10-CM | POA: Insufficient documentation

## 2014-05-04 DIAGNOSIS — Z8673 Personal history of transient ischemic attack (TIA), and cerebral infarction without residual deficits: Secondary | ICD-10-CM | POA: Insufficient documentation

## 2014-05-04 DIAGNOSIS — Z96619 Presence of unspecified artificial shoulder joint: Secondary | ICD-10-CM | POA: Insufficient documentation

## 2014-05-04 DIAGNOSIS — I739 Peripheral vascular disease, unspecified: Secondary | ICD-10-CM

## 2014-05-04 DIAGNOSIS — F329 Major depressive disorder, single episode, unspecified: Secondary | ICD-10-CM | POA: Diagnosis not present

## 2014-05-04 DIAGNOSIS — I447 Left bundle-branch block, unspecified: Secondary | ICD-10-CM | POA: Insufficient documentation

## 2014-05-04 DIAGNOSIS — F411 Generalized anxiety disorder: Secondary | ICD-10-CM | POA: Diagnosis not present

## 2014-05-04 DIAGNOSIS — Z7982 Long term (current) use of aspirin: Secondary | ICD-10-CM | POA: Diagnosis not present

## 2014-05-04 DIAGNOSIS — J449 Chronic obstructive pulmonary disease, unspecified: Secondary | ICD-10-CM | POA: Insufficient documentation

## 2014-05-04 DIAGNOSIS — Z7902 Long term (current) use of antithrombotics/antiplatelets: Secondary | ICD-10-CM | POA: Insufficient documentation

## 2014-05-04 DIAGNOSIS — I70219 Atherosclerosis of native arteries of extremities with intermittent claudication, unspecified extremity: Secondary | ICD-10-CM

## 2014-05-04 DIAGNOSIS — Z48812 Encounter for surgical aftercare following surgery on the circulatory system: Secondary | ICD-10-CM

## 2014-05-04 DIAGNOSIS — E785 Hyperlipidemia, unspecified: Secondary | ICD-10-CM | POA: Insufficient documentation

## 2014-05-04 DIAGNOSIS — D696 Thrombocytopenia, unspecified: Secondary | ICD-10-CM | POA: Diagnosis not present

## 2014-05-04 DIAGNOSIS — I708 Atherosclerosis of other arteries: Secondary | ICD-10-CM | POA: Diagnosis not present

## 2014-05-04 DIAGNOSIS — J4489 Other specified chronic obstructive pulmonary disease: Secondary | ICD-10-CM | POA: Insufficient documentation

## 2014-05-04 DIAGNOSIS — F17200 Nicotine dependence, unspecified, uncomplicated: Secondary | ICD-10-CM | POA: Insufficient documentation

## 2014-05-04 DIAGNOSIS — F3289 Other specified depressive episodes: Secondary | ICD-10-CM | POA: Insufficient documentation

## 2014-05-04 HISTORY — PX: ABDOMINAL AORTAGRAM: SHX5454

## 2014-05-04 LAB — POCT I-STAT, CHEM 8
BUN: <3 mg/dL — ABNORMAL LOW (ref 6–23)
Calcium, Ion: 1.21 mmol/L (ref 1.13–1.30)
Chloride: 101 mEq/L (ref 96–112)
Creatinine, Ser: 0.5 mg/dL (ref 0.50–1.35)
GLUCOSE: 97 mg/dL (ref 70–99)
HEMATOCRIT: 44 % (ref 39.0–52.0)
HEMOGLOBIN: 15 g/dL (ref 13.0–17.0)
Potassium: 4.2 mEq/L (ref 3.7–5.3)
Sodium: 139 mEq/L (ref 137–147)
TCO2: 22 mmol/L (ref 0–100)

## 2014-05-04 LAB — POCT ACTIVATED CLOTTING TIME
ACTIVATED CLOTTING TIME: 249 s
ACTIVATED CLOTTING TIME: 492 s
Activated Clotting Time: 166 seconds

## 2014-05-04 LAB — PROTIME-INR
INR: 1.02 (ref 0.00–1.49)
Prothrombin Time: 13.2 seconds (ref 11.6–15.2)

## 2014-05-04 SURGERY — ABDOMINAL AORTAGRAM
Anesthesia: LOCAL

## 2014-05-04 MED ORDER — PHENOL 1.4 % MT LIQD: 1.0000 | OROMUCOSAL | Status: DC | PRN

## 2014-05-04 MED ORDER — LIDOCAINE HCL (PF) 1 % IJ SOLN
INTRAMUSCULAR | Status: AC
  Filled 2014-05-04: qty 30

## 2014-05-04 MED ORDER — ACETAMINOPHEN 325 MG RE SUPP: 325.0000 mg | RECTAL | Status: DC | PRN

## 2014-05-04 MED ORDER — FENTANYL CITRATE 0.05 MG/ML IJ SOLN
INTRAMUSCULAR | Status: AC
  Filled 2014-05-04: qty 2

## 2014-05-04 MED ORDER — ACETAMINOPHEN 325 MG PO TABS: 325.0000 mg | ORAL_TABLET | ORAL | Status: DC | PRN

## 2014-05-04 MED ORDER — MORPHINE SULFATE 10 MG/ML IJ SOLN: 2.0000 mg | INTRAMUSCULAR | Status: DC | PRN

## 2014-05-04 MED ORDER — SODIUM CHLORIDE 0.9 % IV SOLN
INTRAVENOUS | Status: DC
  Administered 2014-05-04: 06:00:00 via INTRAVENOUS

## 2014-05-04 MED ORDER — METOPROLOL TARTRATE 1 MG/ML IV SOLN: 2.0000 mg | INTRAVENOUS | Status: DC | PRN

## 2014-05-04 MED ORDER — ALUM & MAG HYDROXIDE-SIMETH 200-200-20 MG/5ML PO SUSP: 15.0000 mL | ORAL | Status: DC | PRN

## 2014-05-04 MED ORDER — ONDANSETRON HCL 4 MG/2ML IJ SOLN: 4.0000 mg | Freq: Four times a day (QID) | INTRAMUSCULAR | Status: DC | PRN

## 2014-05-04 MED ORDER — HEPARIN SODIUM (PORCINE) 1000 UNIT/ML IJ SOLN
INTRAMUSCULAR | Status: AC
  Filled 2014-05-04: qty 1

## 2014-05-04 MED ORDER — GUAIFENESIN-DM 100-10 MG/5ML PO SYRP: 15.0000 mL | ORAL_SOLUTION | ORAL | Status: DC | PRN

## 2014-05-04 MED ORDER — LABETALOL HCL 5 MG/ML IV SOLN: 10.0000 mg | INTRAVENOUS | Status: DC | PRN

## 2014-05-04 MED ORDER — SODIUM CHLORIDE 0.9 % IV SOLN: 1.0000 mL/kg/h | INTRAVENOUS | Status: DC

## 2014-05-04 MED ORDER — HYDRALAZINE HCL 20 MG/ML IJ SOLN: 10.0000 mg | INTRAMUSCULAR | Status: DC | PRN

## 2014-05-04 MED ORDER — HEPARIN (PORCINE) IN NACL 2-0.9 UNIT/ML-% IJ SOLN
INTRAMUSCULAR | Status: AC
  Filled 2014-05-04: qty 1000

## 2014-05-04 MED ORDER — MIDAZOLAM HCL 2 MG/2ML IJ SOLN
INTRAMUSCULAR | Status: AC
  Filled 2014-05-04: qty 2

## 2014-05-04 MED ORDER — OXYCODONE HCL 5 MG PO TABS: 5.0000 mg | ORAL_TABLET | ORAL | Status: DC | PRN

## 2014-05-04 SURGICAL SUPPLY — 55 items
ADH SKN CLS APL DERMABOND .7 (GAUZE/BANDAGES/DRESSINGS) ×2
BANDAGE ELASTIC 4 VELCRO ST LF (GAUZE/BANDAGES/DRESSINGS) IMPLANT
BANDAGE ESMARK 6X9 LF (GAUZE/BANDAGES/DRESSINGS) IMPLANT
BNDG CMPR 9X6 STRL LF SNTH (GAUZE/BANDAGES/DRESSINGS)
BNDG ESMARK 6X9 LF (GAUZE/BANDAGES/DRESSINGS)
CANISTER SUCTION 2500CC (MISCELLANEOUS) ×3 IMPLANT
CLIP TI MEDIUM 24 (CLIP) ×3 IMPLANT
CLIP TI WIDE RED SMALL 24 (CLIP) ×3 IMPLANT
COVER SURGICAL LIGHT HANDLE (MISCELLANEOUS) ×3 IMPLANT
CUFF TOURNIQUET SINGLE 24IN (TOURNIQUET CUFF) IMPLANT
CUFF TOURNIQUET SINGLE 34IN LL (TOURNIQUET CUFF) IMPLANT
CUFF TOURNIQUET SINGLE 44IN (TOURNIQUET CUFF) IMPLANT
DERMABOND ADVANCED (GAUZE/BANDAGES/DRESSINGS) ×1
DERMABOND ADVANCED .7 DNX12 (GAUZE/BANDAGES/DRESSINGS) ×2 IMPLANT
DRAIN CHANNEL 15F RND FF W/TCR (WOUND CARE) IMPLANT
DRAPE WARM FLUID 44X44 (DRAPE) ×3 IMPLANT
DRAPE X-RAY CASS 24X20 (DRAPES) IMPLANT
DRSG COVADERM 4X10 (GAUZE/BANDAGES/DRESSINGS) IMPLANT
DRSG COVADERM 4X8 (GAUZE/BANDAGES/DRESSINGS) IMPLANT
ELECT REM PT RETURN 9FT ADLT (ELECTROSURGICAL) ×3
ELECTRODE REM PT RTRN 9FT ADLT (ELECTROSURGICAL) ×2 IMPLANT
EVACUATOR SILICONE 100CC (DRAIN) IMPLANT
GLOVE BIOGEL PI IND STRL 7.5 (GLOVE) ×2 IMPLANT
GLOVE BIOGEL PI INDICATOR 7.5 (GLOVE) ×1
GLOVE SURG SS PI 7.5 STRL IVOR (GLOVE) ×3 IMPLANT
GOWN PREVENTION PLUS XXLARGE (GOWN DISPOSABLE) ×3 IMPLANT
GOWN STRL NON-REIN LRG LVL3 (GOWN DISPOSABLE) ×9 IMPLANT
HEMOSTAT SNOW SURGICEL 2X4 (HEMOSTASIS) IMPLANT
KIT BASIN OR (CUSTOM PROCEDURE TRAY) ×3 IMPLANT
KIT ROOM TURNOVER OR (KITS) ×3 IMPLANT
MARKER GRAFT CORONARY BYPASS (MISCELLANEOUS) IMPLANT
NS IRRIG 1000ML POUR BTL (IV SOLUTION) ×6 IMPLANT
PACK PERIPHERAL VASCULAR (CUSTOM PROCEDURE TRAY) ×3 IMPLANT
PAD ARMBOARD 7.5X6 YLW CONV (MISCELLANEOUS) ×6 IMPLANT
PADDING CAST COTTON 6X4 STRL (CAST SUPPLIES) IMPLANT
SET COLLECT BLD 21X3/4 12 (NEEDLE) IMPLANT
STOPCOCK 4 WAY LG BORE MALE ST (IV SETS) IMPLANT
SUT ETHILON 3 0 PS 1 (SUTURE) IMPLANT
SUT PROLENE 5 0 C 1 24 (SUTURE) ×3 IMPLANT
SUT PROLENE 6 0 BV (SUTURE) ×3 IMPLANT
SUT PROLENE 7 0 BV 1 (SUTURE) IMPLANT
SUT SILK 2 0 SH (SUTURE) ×3 IMPLANT
SUT SILK 3 0 (SUTURE)
SUT SILK 3-0 18XBRD TIE 12 (SUTURE) IMPLANT
SUT VIC AB 2-0 CT1 27 (SUTURE) ×6
SUT VIC AB 2-0 CT1 TAPERPNT 27 (SUTURE) ×4 IMPLANT
SUT VIC AB 3-0 SH 27 (SUTURE) ×6
SUT VIC AB 3-0 SH 27X BRD (SUTURE) ×4 IMPLANT
SUT VICRYL 4-0 PS2 18IN ABS (SUTURE) ×6 IMPLANT
TOWEL OR 17X24 6PK STRL BLUE (TOWEL DISPOSABLE) ×6 IMPLANT
TOWEL OR 17X26 10 PK STRL BLUE (TOWEL DISPOSABLE) ×6 IMPLANT
TRAY FOLEY CATH 16FRSI W/METER (SET/KITS/TRAYS/PACK) ×3 IMPLANT
TUBING EXTENTION W/L.L. (IV SETS) IMPLANT
UNDERPAD 30X30 INCONTINENT (UNDERPADS AND DIAPERS) ×3 IMPLANT
WATER STERILE IRR 1000ML POUR (IV SOLUTION) ×3 IMPLANT

## 2014-05-04 NOTE — Op Note (Signed)
Patient name: Roy [name] MRN: [id number] DOB: [birth year] Sex: male  05/04/2014 Pre-operative Diagnosis: Bilateral claudication Post-operative diagnosis:  Same Surgeon:  [name] Procedure Performed:  1.  ultrasound-guided access, right femoral artery  2.  abdominal aortogram  3.  bilateral lower extremity runoff  4.  stent, left external iliac artery  5.  stent, right external iliac artery   Indications:  The patient suffers from bilateral claudication.  He had a CT angiogram which revealed multilevel disease.  He comes in for further evaluation and possible treatment.  Procedure:  The patient was identified in the holding area and taken to room 8.  The patient was then placed supine on the table and prepped and draped in the usual sterile fashion.  A time out was called.  Ultrasound was used to evaluate the right common femoral artery.  It was patent .  A digital ultrasound image was acquired.  A micropuncture needle was used to access the right common femoral artery under ultrasound guidance.  An 018 wire was advanced without resistance and a micropuncture sheath was placed.  The 018 wire was removed and a benson wire was placed.  The micropuncture sheath was exchanged for a 5 french sheath.  An omniflush catheter was advanced over the wire to the level of L-1.  An abdominal angiogram was obtained.  Next, the Omni flush catheter was pulled down to the aortic bifurcation and bilateral lower sternum a runoff was performed. Findings:   Aortogram:  No significant suprarenal aortic stenosis is identified.  There is no evidence of renal artery stenosis.  The infrarenal abdominal aorta is heavily calcified without significant stenosis.  The left common iliac artery is patent throughout it's course.  There is mild stenosis at the origin of the left hypogastric artery.  A hemodynamically significant lesion is identified within the left external iliac artery.  The right common iliac artery is  widely patent.  Mild disease is noted at the origin of the right hypogastric artery.  There is a hemodynamically significant stenosis within the right external iliac artery.  Right Lower Extremity:  Approximately 60% stenosis is identified within the right common femoral artery which is heavily calcified.  The profunda femoral artery is widely patent.  The superficial femoral artery is patent throughout it's course with several areas of calcification contributing to mild-moderate stenosis.  The popliteal artery is patent throughout it's course.  There is three-vessel runoff.  Left Lower Extremity:  Left common femoral artery is heavily calcified but patent.  The profunda femoral artery is widely patent.  There is moderate stenosis identified within the adductor canal.  The popliteal artery is patent throughout it's course with two-vessel runoff to the left foot.  The posterior tibial artery is occluded.  Intervention:  After the above images were acquired, the decision was made to proceed with intervention.  Over a Rosen wire and the left external iliac artery, a 7 French 40 cm sheath was advanced into the left external iliac artery.  The patient was fully heparinized.  I elected to primarily stented the left external iliac artery.  A Cordis 8 x 40 self-expanding stent was selected and then deployed landing at the origin of the left external iliac artery.  The stent was molded with a 7 mm balloon.  Completion angiogram revealed resolution of the external iliac stenosis.  Next the sheath was withdrawn to the distal right external iliac artery.  I selected a Cordis 8 x 80 self-expanding stent.  This was landed at the origin of the left external iliac artery and extended into the distal external iliac artery.  It was molded to confirmation with a 7 mm balloon.  Completion angiography revealed resolution of the stenosis within the right external iliac artery.  The long sheath was exchanged out for a short [place]  sheath.  The patient taken the holding area once his coag profile corrects.  Impression:  #1  heavily calcified abdominal aorta without significant stenosis.  #2  high-grade bilateral external iliac stenosis.  The right was treated with an 8 x 80 Cordis self-expanding stent.  The left was treated with an 8 x 40 Cordis self-expanding stent.  #3  moderate left superficial femoral artery stenosis at the adductor canal  #4  moderate to severe right common femoral stenosis.   Theotis Burrow, M.D. [facility] Office: [phone] Pager:  [phone]

## 2014-05-04 NOTE — Interval H&P Note (Signed)
History and Physical Interval Note:  05/04/2014 7:55 AM  [name]  has presented today for surgery, with the diagnosis of pvd  The various methods of treatment have been discussed with the patient and family. After consideration of risks, benefits and other options for treatment, the patient has consented to  Procedure(s): ABDOMINAL AORTAGRAM ([name]) as a surgical intervention .  The patient's history has been reviewed, patient examined, no change in status, stable for surgery.  I have reviewed the patient's chart and labs.  Questions were answered to the patient's satisfaction.     Roy [name]

## 2014-05-04 NOTE — Discharge Instructions (Signed)
Arteriogram °Care After °These instructions give you information on caring for yourself after your procedure. Your doctor may also give you more specific instructions. Call your doctor if you have any problems or questions after your procedure. °HOME CARE °· Stay in bed the rest of the day. °· Keep your leg straight for at least 6 hours. °· Do not lift anything heavier than 10 pounds (about a gallon of milk) for 2 days. °· Do not walk a lot, run, or drive for 2 days. °· Return to normal activities in 2 days or as told by your doctor. °Finding out the results of your test °Ask when your test results will be ready. Make sure you get your test results. °GET HELP RIGHT AWAY IF:  °· You have fever of 102° F (38.9° C) or higher. °· You have more pain in your leg. °· The leg that was cut is: °· Bleeding. °· Puffy (swollen) or red. °· Cold. °· Pale or changes color. °· Weak. °· Tingly or numb. °If you go to the Emergency Room, tell your nurse that you have had an arteriogram. Take this paper with you to show the nurse. °MAKE SURE YOU: °· Understand these instructions. °· Will watch your condition. °· Will get help right away if you are not doing well or get worse. °Document Released: 02/08/2009 Document Revised: 02/04/2012 Document Reviewed: 02/08/2009 °ExitCare® Patient Information ©2014 [employer]. ° °

## 2014-05-04 NOTE — H&P (View-Only) (Signed)
Patient name: Roy [name] MRN: [id number] DOB: 12-07-1945 Sex: male   Referred by: Dr. Nevada Crane  Reason for referral:  Chief Complaint  Patient presents with  . New Evaluation    claudication     HISTORY OF PRESENT ILLNESS: 68 year old gentleman who is referred today for evaluation of bilateral leg pain.  The patient states that he has been having worsening pain in his legs for the past 6-8 months.  He denies ulcers or rest pain.  He states that his activity is severely limited.  He can walk approximately several steps before his legs give non-that he feels like he is going to fall.  He had a CT angiogram in March which revealed diffuse disease in his aortoiliac segment as well as his outflow and runoff vessels.  The patient has a significant smoking history with no interest of stopping.  He has a history of stroke as well as a intracranial carotid stent.  He has a history of significant alcohol consumption with hospitalization for withdrawal.  He is medically managed for hypertension.  He is on a statin for hypercholesterolemia.  He is on dual agent antiplatelet therapy with aspirin and Plavix.  Past Medical History  Diagnosis Date  . Cerebrovascular disease 2009    TIA; 2009- right ICA stent; re-intervention for restenosis complicated by [facility] w/o sx  . Degenerative joint disease     Total shoulder arthroplasty-right  . LBBB (left bundle branch block)     Normal echo-2011; stress nuclear in 09/2010--septal hypoperfusion representing nontransmural infarction or the effect of left bundle branch block, no ischemia  . Hyperlipidemia   . Hypertension   . Chronic obstructive pulmonary disease   . Tobacco abuse     -100 pack years; 1.5 packs per day  . Anxiety and depression   . Alcohol abuse     6 beers per day; hospital admission in 2009 for withdrawal symptoms  . Thrombocytopenia   . Tubular adenoma of colon     Past Surgical History  Procedure Laterality Date  . Lumbar  fusion  2010  . Vasectomy  1971  . Colonoscopy w/ polypectomy  2011  . Total shoulder arthroplasty  2011    Right-Dr. Tamera Punt  . Colonoscopy  08/22/09    Fields-(Tubular Adenoma)3-mm transverse polyp/4-mm polyp otherwise noraml/small internal hemorrhoids  . Esophagogastroduodenoscopy [name] 03/05/2014    Procedure: ESOPHAGOGASTRODUODENOSCOPY (EGD);  Surgeon: Danie Binder, MD;  [facility];  Service: Endoscopy;  Laterality: [name];  11:45  . Agile capsule [name] 03/18/2014    Procedure: AGILE CAPSULE;  Surgeon: Danie Binder, MD;  [facility];  Service: Endoscopy;  Laterality: [name];  7:30  . Givens capsule study [name] 03/30/2014    Procedure: GIVENS CAPSULE STUDY;  Surgeon: Danie Binder, MD;  [facility];  Service: Endoscopy;  Laterality: [name];  7:30  . Colonoscopy [name] 04/14/2014    Procedure: COLONOSCOPY;  Surgeon: Danie Binder, MD;  [facility];  Service: Endoscopy;  Laterality: [name];  9:30    History   Social History  . Marital Status: Married    Spouse Name: [name]    Number of Children: 2  . Years of Education: [name]   Occupational History  . 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     Social History Main Topics  . Smoking status: Current Every Day Smoker -- 1.00 packs/day for 60 years    Types: Cigarettes  . Smokeless  tobacco: Never Used  . Alcohol Use: 36.0 oz/week    60 Cans of beer per week     Comment: 12 pack in a week (1/2 can of beer per day) x 60years  . Drug Use: No  . Sexual Activity: No   Other Topics Concern  . Not on file   Social History Narrative   Accompanied by daughter [name]   Lives w/ wife, daughter    Family History  Problem Relation Age of Onset  . Prostate cancer Brother   . Coronary artery disease Mother   . Diabetes Mother   . Heart disease Mother   . Hypertension Mother   . Heart attack Mother   . Alzheimer's disease Sister   . Alzheimer's disease Father   . Diabetes Father   . Colon  cancer Neg Hx     Allergies as of 04/26/2014  . (No Known Allergies)    Current Outpatient Prescriptions on File Prior to Visit  Medication Sig Dispense Refill  . amLODipine-benazepril (LOTREL) 5-20 MG per capsule Take 1 capsule by mouth 2 (two) times daily.       [name] aspirin EC 81 MG tablet Take 81 mg by mouth daily.      [name] atorvastatin (LIPITOR) 20 MG tablet Take 20 mg by mouth at bedtime.      . Chlorphen-Pseudoephed-APAP (TYLENOL ALLERGY COMPLETE PO) Take 1 tablet by mouth daily as needed (allergies).      . cloNIDine (CATAPRES - DOSED IN MG/24 HR) 0.1 mg/24hr patch Place 0.1 mg onto the skin once a week. Monday      . clopidogrel (PLAVIX) 75 MG tablet Take 75 mg by mouth daily.        . Coenzyme Q10 (CO Q 10) 100 MG CAPS Take 1 capsule by mouth daily.      . cyanocobalamin 2000 MCG tablet Take 2,000 mcg by mouth 3 (three) times a week. Takes onTuesday, Thursday, and Saturday      . folic acid (FOLVITE) 458 MCG tablet Take 400 mcg by mouth daily.      [name] Oil 300 MG CAPS Take 1 capsule by mouth daily.      [name] loratadine (CLARITIN) 10 MG tablet Take 10 mg by mouth daily.      [name] LORazepam (ATIVAN) 1 MG tablet Take 1 mg by mouth every 8 (eight) hours as needed for anxiety.      . Multiple Vitamins-Minerals (CENTRUM SILVER PO) Take 0.5 tablets by mouth 2 (two) times daily.       . pantoprazole (PROTONIX) 40 MG tablet Take 40 mg by mouth 2 (two) times daily.      . Prenatal MV-Min-Fe Fum-FA-DHA (PRENATAL 1 PO) Take 1 tablet by mouth daily.      . Thiamine HCl (VITAMIN B-1) 250 MG tablet Take 125 mg by mouth daily.       . vitamin C (ASCORBIC ACID) 500 MG tablet Take 500 mg by mouth daily.         No current facility-administered medications on file prior to visit.     REVIEW OF SYSTEMS: Cardiovascular: No chest pain, chest pressure, palpitations, positive for shortness of breath on lying flat, shortness of breath with exertion, pain in legs with walking, pain and fever in my clinic,  swelling in legs. Pulmonary: No productive cough, asthma.  Positive for wheezing Neurologic: No weakness, paresthesias, aphasia, or amaurosis. No dizziness. Hematologic: No bleeding problems or clotting disorders. Musculoskeletal: No joint pain or joint  swelling. Gastrointestinal: No blood in stool or hematemesis Genitourinary: Positive for dysuria. Psychiatric:: No history of major depression. Integumentary: No rashes or ulcers. Constitutional: No fever or chills.  PHYSICAL EXAMINATION: General: The patient appears their stated age.  Vital signs are BP 140/77  Pulse 76  Ht 5\' 6"  (1.676 m)  Wt 158 lb (71.668 kg)  BMI 25.51 kg/m2  SpO2 100% HEENT:  No gross abnormalities Pulmonary: Respirations are non-labored Abdomen: Soft and non-tender  Musculoskeletal: There are no major deformities.   Neurologic: No focal weakness or paresthesias are detected, Skin: There are no ulcer or rashes noted. Psychiatric: The patient has normal affect. Cardiovascular: There is a regular rate and rhythm without significant murmur appreciated.  No carotid bruits.  Palpable femoral pulses.  Pedal pulses are nonpalpable.  Diagnostic Studies: I have reviewed his CT and exam which shows the following:  Aorta and common iliac arteries are non aneurysmal and patent.  Significant narrowing in the right external iliac artery.  50% narrowing in the right and left common femoral arteries.  Multiple areas of significant narrowing in the left superficial  femoral artery. Two-vessel runoff to the left ankle.  Three vessel runoff to the right ankle.  Multiple small pulmonary nodules as described     Assessment:  Bilateral claudication Plan: I feel the patient has significant lifestyle limiting claudication in bilateral lower extremities.  I feel he would benefit from additional imaging with angiography.  At that time I would also consider endovascular treatment with stenting, most likely in his aortoiliac  section.  This is been scheduled for June 9.  The patient will continue his aspirin and Plavix.  I discussed that he may also require surgical revascularization which would not be performed at this time.  Hopefully, his intervention will be limited to the aortoiliac section, however this may require treatment of his superficial femoral/popliteal section     V. Leia Alf, M.D. [facility] [place] Office: [phone] Pager:  [phone]

## 2014-05-05 ENCOUNTER — Telehealth: Payer: Self-pay | Admitting: Surgery

## 2014-05-05 NOTE — Telephone Encounter (Addendum)
Message copied by Gena Fray on Wed May 05, 2014 10:55 AM ------      Message from: Mena Goes      Created: Tue May 04, 2014 10:36 AM      Regarding: Schedule                   ----- Message -----         From: Dario Ave         Sent: 05/04/2014   9:58 AM           To: [facility] Charge Pool      Subject: [name]'s log                                                            ----- Message -----         From: [name], MD         Sent: 05/04/2014   9:17 AM           To: [facility] Charge Pool            05/04/2014:            Surgeon:  [name]      Procedure Performed:       1.  ultrasound-guided access, right femoral artery       2.  abdominal aortogram       3.  bilateral lower extremity runoff       4.  stent, left external iliac artery       5.  stent, right external iliac artery                  Schedule patient to followup with me in the office in one month with ABIs. ------  05/05/14: lm for pt re appt, dpm

## 2014-05-27 ENCOUNTER — Encounter: Payer: Self-pay | Admitting: Surgery

## 2014-05-31 ENCOUNTER — Ambulatory Visit (INDEPENDENT_AMBULATORY_CARE_PROVIDER_SITE_OTHER)
Admission: RE | Admit: 2014-05-31 | Discharge: 2014-05-31 | Disposition: A | Discharge status: Home / Self Care | Payer: Medicare Other | Source: Ambulatory Visit | Attending: Surgery | Admitting: Surgery

## 2014-05-31 ENCOUNTER — Ambulatory Visit (INDEPENDENT_AMBULATORY_CARE_PROVIDER_SITE_OTHER): Payer: Medicare Other | Admitting: Surgery

## 2014-05-31 ENCOUNTER — Encounter: Payer: Self-pay | Admitting: Surgery

## 2014-05-31 VITALS — BP 133/70 | HR 76 | Ht 66.0 in | Wt 160.0 lb

## 2014-05-31 DIAGNOSIS — J9600 Acute respiratory failure, unspecified whether with hypoxia or hypercapnia: Secondary | ICD-10-CM | POA: Diagnosis not present

## 2014-05-31 DIAGNOSIS — I739 Peripheral vascular disease, unspecified: Secondary | ICD-10-CM | POA: Insufficient documentation

## 2014-05-31 DIAGNOSIS — J811 Chronic pulmonary edema: Secondary | ICD-10-CM | POA: Diagnosis not present

## 2014-05-31 DIAGNOSIS — R0602 Shortness of breath: Secondary | ICD-10-CM | POA: Diagnosis not present

## 2014-05-31 DIAGNOSIS — R748 Abnormal levels of other serum enzymes: Secondary | ICD-10-CM | POA: Diagnosis not present

## 2014-05-31 DIAGNOSIS — I509 Heart failure, unspecified: Secondary | ICD-10-CM | POA: Diagnosis not present

## 2014-05-31 DIAGNOSIS — Z48812 Encounter for surgical aftercare following surgery on the circulatory system: Secondary | ICD-10-CM | POA: Diagnosis not present

## 2014-05-31 DIAGNOSIS — J449 Chronic obstructive pulmonary disease, unspecified: Secondary | ICD-10-CM | POA: Diagnosis not present

## 2014-05-31 DIAGNOSIS — J189 Pneumonia, unspecified organism: Secondary | ICD-10-CM | POA: Diagnosis not present

## 2014-05-31 DIAGNOSIS — F1010 Alcohol abuse, uncomplicated: Secondary | ICD-10-CM | POA: Diagnosis not present

## 2014-05-31 NOTE — Progress Notes (Signed)
Patient name: Roy [name] MRN: [id number] DOB: Jan 04, 1946 Sex: male     Chief Complaint  Patient presents with  . Re-evaluation    1 month f/u     HISTORY OF PRESENT ILLNESS: The patient is back today for followup.  He is status post bilateral iliac stenting on 05/04/2014.  This was done for lifestyle limiting claudication.  The patient reports that he feels he is a little bit better.  Today he feels that his leg pain which occurs mainly with standing is essentially unchanged.  He states that activity does improve his symptoms.  Family members with him feel that his symptoms have improved and he is more active.  Past Medical History  Diagnosis Date  . Cerebrovascular disease 2009    TIA; 2009- right ICA stent; re-intervention for restenosis complicated by [facility] w/o sx  . Degenerative joint disease     Total shoulder arthroplasty-right  . LBBB (left bundle branch block)     Normal echo-2011; stress nuclear in 09/2010--septal hypoperfusion representing nontransmural infarction or the effect of left bundle branch block, no ischemia  . Hyperlipidemia   . Hypertension   . Chronic obstructive pulmonary disease   . Tobacco abuse     -100 pack years; 1.5 packs per day  . Anxiety and depression   . Alcohol abuse     6 beers per day; hospital admission in 2009 for withdrawal symptoms  . Thrombocytopenia   . Tubular adenoma of colon     Past Surgical History  Procedure Laterality Date  . Lumbar fusion  2010  . Vasectomy  1971  . Colonoscopy w/ polypectomy  2011  . Total shoulder arthroplasty  2011    Right-Dr. Tamera Punt  . Colonoscopy  08/22/09    Fields-(Tubular Adenoma)3-mm transverse polyp/4-mm polyp otherwise noraml/small internal hemorrhoids  . Esophagogastroduodenoscopy [name] 03/05/2014    Procedure: ESOPHAGOGASTRODUODENOSCOPY (EGD);  Surgeon: Danie Binder, MD;  [facility];  Service: Endoscopy;  Laterality: [name];  11:45  . Agile capsule [name] 03/18/2014   Procedure: AGILE CAPSULE;  Surgeon: Danie Binder, MD;  [facility];  Service: Endoscopy;  Laterality: [name];  7:30  . Givens capsule study [name] 03/30/2014    Procedure: GIVENS CAPSULE STUDY;  Surgeon: Danie Binder, MD;  [facility];  Service: Endoscopy;  Laterality: [name];  7:30  . Colonoscopy [name] 04/14/2014    Procedure: COLONOSCOPY;  Surgeon: Danie Binder, MD;  [facility];  Service: Endoscopy;  Laterality: [name];  9:30    History   Social History  . Marital Status: Married    Spouse Name: [name]    Number of Children: 2  . Years of Education: [name]   Occupational History  . 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     Social History Main Topics  . Smoking status: Current Every Day Smoker -- 1.00 packs/day for 60 years    Types: Cigarettes  . Smokeless tobacco: Never Used  . Alcohol Use: 36.0 oz/week    60 Cans of beer per week     Comment: 12 pack in a week (1/2 can of beer per day) x 60years  . Drug Use: No  . Sexual Activity: No   Other Topics Concern  . Not on file   Social History Narrative   Accompanied by daughter [name]   Lives w/ wife, daughter    Family History  Problem Relation Age of Onset  .  Prostate cancer Brother   . Coronary artery disease Mother   . Diabetes Mother   . Heart disease Mother   . Hypertension Mother   . Heart attack Mother   . Alzheimer's disease Sister   . Alzheimer's disease Father   . Diabetes Father   . Colon cancer Neg Hx     Allergies as of 05/31/2014  . (No Known Allergies)    Current Outpatient Prescriptions on File Prior to Visit  Medication Sig Dispense Refill  . amLODipine-benazepril (LOTREL) 5-20 MG per capsule Take 1 capsule by mouth 2 (two) times daily.       [name] aspirin EC 81 MG tablet Take 81 mg by mouth every evening.       [name] atorvastatin (LIPITOR) 20 MG tablet Take 20 mg by mouth daily.       . cloNIDine (CATAPRES - DOSED IN MG/24 HR) 0.1 mg/24hr patch Place 0.1 mg onto the  skin once a week. Monday      . clopidogrel (PLAVIX) 75 MG tablet Take 75 mg by mouth every evening.       . Coenzyme Q10 (CO Q 10) 100 MG CAPS Take 100 mg by mouth every evening.       . folic acid (FOLVITE) 601 MCG tablet Take 800 mcg by mouth daily.      [name] Oil 300 MG CAPS Take 300 mg by mouth every evening.       . loratadine (CLARITIN) 10 MG tablet Take 10 mg by mouth.       [name] LORazepam (ATIVAN) 1 MG tablet Take 1 mg by mouth every 8 (eight) hours as needed for anxiety (tremors).       . Multiple Vitamins-Minerals (CENTRUM SILVER PO) Take 0.5 tablets by mouth 2 (two) times daily.       . pantoprazole (PROTONIX) 40 MG tablet Take 40 mg by mouth 2 (two) times daily.      . Prenatal MV-Min-Fe Fum-FA-DHA (PRENATAL 1 PO) Take 1 tablet by mouth daily.      . pseudoephedrine-guaifenesin (MUCINEX D) 60-600 MG per tablet Take 1 tablet by mouth every 12 (twelve) hours as needed for congestion.      [name] pyridOXINE (VITAMIN B-6) 100 MG tablet Take 100 mg by mouth daily.      . Thiamine HCl (VITAMIN B-1) 250 MG tablet Take 125 mg by mouth daily.       . vitamin C (ASCORBIC ACID) 500 MG tablet Take 500 mg by mouth daily.         No current facility-administered medications on file prior to visit.     REVIEW OF SYSTEMS: Please see above, otherwise no changes from prior visit  PHYSICAL EXAMINATION:   Vital signs are BP 133/70  Pulse 76  Ht 5\' 6"  (1.676 m)  Wt 160 lb (72.576 kg)  BMI 25.84 kg/m2  SpO2 96% General: The patient appears their stated age. HEENT:  No gross abnormalities Pulmonary:  Non labored breathing Musculoskeletal: There are no major deformities. Neurologic: No focal weakness or paresthesias are detected, Skin: There are no ulcer or rashes noted. Psychiatric: The patient has normal affect. Cardiovascular: There is a regular rate and rhythm without significant murmur appreciated.  Pedal pulses are not palpable   Diagnostic Studies Ultrasound today shows the right ankle  brachial index has improved.  Today is 0.84.  Previously a 0.69.  Waveforms are triphasic.  On the left common the ABI is essentially unchanged.  It measured 0.72.  Waveforms were triphasic.  Assessment: Bilateral claudication Plan: The patient's symptoms do appear to be somewhat improved after intervention.  However, he still has significant discomfort in his legs which occurs at rest.  Activity is now making his symptoms improved.  This raises the question that this may be arthritic or musculoskeletal in nature.  I have asked him to go discuss this with Dr. Nevada Crane.  From a vascular perspective based on his bloodflow studies today, he should not have this significant of symptoms.  Therefore, I did not feel like his vascular status is causing his current level of discomfort.  I have scheduled him to follow up with me with repeat imaging studies the ultrasound in 6 months.  I again discussed the importance of smoking cessation.  Eldridge Abrahams, M.D. [facility] [place] Office: [phone] Pager:  [phone]

## 2014-06-02 DIAGNOSIS — I219 Acute myocardial infarction, unspecified: Secondary | ICD-10-CM

## 2014-06-02 DIAGNOSIS — I509 Heart failure, unspecified: Secondary | ICD-10-CM

## 2014-06-02 HISTORY — DX: Heart failure, unspecified: I50.9

## 2014-06-02 HISTORY — DX: Acute myocardial infarction, unspecified: I21.9

## 2014-06-03 ENCOUNTER — Encounter (HOSPITAL_COMMUNITY): Payer: Self-pay | Admitting: Emergency Medicine

## 2014-06-03 ENCOUNTER — Emergency Department (HOSPITAL_COMMUNITY): Payer: Medicare Other

## 2014-06-03 ENCOUNTER — Inpatient Hospital Stay (HOSPITAL_COMMUNITY)
Admission: EM | Admit: 2014-06-03 | Discharge: 2014-06-22 | DRG: 207 | Disposition: A | Discharge status: Skilled Nursing Facility | Payer: Medicare Other | Attending: Internal Medicine | Admitting: Internal Medicine

## 2014-06-03 DIAGNOSIS — E870 Hyperosmolality and hypernatremia: Secondary | ICD-10-CM | POA: Diagnosis not present

## 2014-06-03 DIAGNOSIS — R1313 Dysphagia, pharyngeal phase: Secondary | ICD-10-CM | POA: Diagnosis not present

## 2014-06-03 DIAGNOSIS — F17200 Nicotine dependence, unspecified, uncomplicated: Secondary | ICD-10-CM | POA: Diagnosis present

## 2014-06-03 DIAGNOSIS — F1010 Alcohol abuse, uncomplicated: Secondary | ICD-10-CM | POA: Diagnosis present

## 2014-06-03 DIAGNOSIS — Z860100 Personal history of colon polyps, unspecified: Secondary | ICD-10-CM

## 2014-06-03 DIAGNOSIS — I959 Hypotension, unspecified: Secondary | ICD-10-CM | POA: Diagnosis not present

## 2014-06-03 DIAGNOSIS — E872 Acidosis, unspecified: Secondary | ICD-10-CM | POA: Diagnosis present

## 2014-06-03 DIAGNOSIS — R197 Diarrhea, unspecified: Secondary | ICD-10-CM | POA: Diagnosis not present

## 2014-06-03 DIAGNOSIS — G9341 Metabolic encephalopathy: Secondary | ICD-10-CM | POA: Diagnosis not present

## 2014-06-03 DIAGNOSIS — I517 Cardiomegaly: Secondary | ICD-10-CM | POA: Diagnosis not present

## 2014-06-03 DIAGNOSIS — F419 Anxiety disorder, unspecified: Secondary | ICD-10-CM

## 2014-06-03 DIAGNOSIS — F1020 Alcohol dependence, uncomplicated: Secondary | ICD-10-CM | POA: Diagnosis present

## 2014-06-03 DIAGNOSIS — I214 Non-ST elevation (NSTEMI) myocardial infarction: Secondary | ICD-10-CM | POA: Diagnosis present

## 2014-06-03 DIAGNOSIS — Z8249 Family history of ischemic heart disease and other diseases of the circulatory system: Secondary | ICD-10-CM

## 2014-06-03 DIAGNOSIS — R4182 Altered mental status, unspecified: Secondary | ICD-10-CM | POA: Diagnosis not present

## 2014-06-03 DIAGNOSIS — F10931 Alcohol use, unspecified with withdrawal delirium: Secondary | ICD-10-CM | POA: Diagnosis not present

## 2014-06-03 DIAGNOSIS — D72829 Elevated white blood cell count, unspecified: Secondary | ICD-10-CM | POA: Diagnosis present

## 2014-06-03 DIAGNOSIS — J449 Chronic obstructive pulmonary disease, unspecified: Secondary | ICD-10-CM

## 2014-06-03 DIAGNOSIS — F411 Generalized anxiety disorder: Secondary | ICD-10-CM | POA: Diagnosis present

## 2014-06-03 DIAGNOSIS — R000 Tachycardia, unspecified: Secondary | ICD-10-CM | POA: Diagnosis not present

## 2014-06-03 DIAGNOSIS — D696 Thrombocytopenia, unspecified: Secondary | ICD-10-CM | POA: Diagnosis present

## 2014-06-03 DIAGNOSIS — F3289 Other specified depressive episodes: Secondary | ICD-10-CM | POA: Diagnosis present

## 2014-06-03 DIAGNOSIS — R0602 Shortness of breath: Secondary | ICD-10-CM | POA: Diagnosis not present

## 2014-06-03 DIAGNOSIS — E871 Hypo-osmolality and hyponatremia: Secondary | ICD-10-CM | POA: Diagnosis present

## 2014-06-03 DIAGNOSIS — K7030 Alcoholic cirrhosis of liver without ascites: Secondary | ICD-10-CM | POA: Diagnosis not present

## 2014-06-03 DIAGNOSIS — I10 Essential (primary) hypertension: Secondary | ICD-10-CM | POA: Diagnosis not present

## 2014-06-03 DIAGNOSIS — J9600 Acute respiratory failure, unspecified whether with hypoxia or hypercapnia: Secondary | ICD-10-CM | POA: Diagnosis not present

## 2014-06-03 DIAGNOSIS — F10239 Alcohol dependence with withdrawal, unspecified: Secondary | ICD-10-CM

## 2014-06-03 DIAGNOSIS — R64 Cachexia: Secondary | ICD-10-CM | POA: Diagnosis present

## 2014-06-03 DIAGNOSIS — F10231 Alcohol dependence with withdrawal delirium: Secondary | ICD-10-CM

## 2014-06-03 DIAGNOSIS — I509 Heart failure, unspecified: Secondary | ICD-10-CM | POA: Diagnosis not present

## 2014-06-03 DIAGNOSIS — E861 Hypovolemia: Secondary | ICD-10-CM | POA: Diagnosis not present

## 2014-06-03 DIAGNOSIS — Z96619 Presence of unspecified artificial shoulder joint: Secondary | ICD-10-CM

## 2014-06-03 DIAGNOSIS — I2510 Atherosclerotic heart disease of native coronary artery without angina pectoris: Secondary | ICD-10-CM | POA: Diagnosis present

## 2014-06-03 DIAGNOSIS — G9340 Encephalopathy, unspecified: Secondary | ICD-10-CM | POA: Diagnosis not present

## 2014-06-03 DIAGNOSIS — IMO0002 Reserved for concepts with insufficient information to code with codable children: Secondary | ICD-10-CM | POA: Diagnosis not present

## 2014-06-03 DIAGNOSIS — Z981 Arthrodesis status: Secondary | ICD-10-CM

## 2014-06-03 DIAGNOSIS — M19019 Primary osteoarthritis, unspecified shoulder: Secondary | ICD-10-CM

## 2014-06-03 DIAGNOSIS — R0609 Other forms of dyspnea: Secondary | ICD-10-CM | POA: Diagnosis not present

## 2014-06-03 DIAGNOSIS — R911 Solitary pulmonary nodule: Secondary | ICD-10-CM | POA: Diagnosis present

## 2014-06-03 DIAGNOSIS — R918 Other nonspecific abnormal finding of lung field: Secondary | ICD-10-CM | POA: Diagnosis not present

## 2014-06-03 DIAGNOSIS — J9602 Acute respiratory failure with hypercapnia: Secondary | ICD-10-CM

## 2014-06-03 DIAGNOSIS — F329 Major depressive disorder, single episode, unspecified: Secondary | ICD-10-CM

## 2014-06-03 DIAGNOSIS — Z720 Tobacco use: Secondary | ICD-10-CM

## 2014-06-03 DIAGNOSIS — I679 Cerebrovascular disease, unspecified: Secondary | ICD-10-CM

## 2014-06-03 DIAGNOSIS — I739 Peripheral vascular disease, unspecified: Secondary | ICD-10-CM | POA: Diagnosis not present

## 2014-06-03 DIAGNOSIS — Z7982 Long term (current) use of aspirin: Secondary | ICD-10-CM

## 2014-06-03 DIAGNOSIS — Z833 Family history of diabetes mellitus: Secondary | ICD-10-CM

## 2014-06-03 DIAGNOSIS — I2589 Other forms of chronic ischemic heart disease: Secondary | ICD-10-CM | POA: Diagnosis present

## 2014-06-03 DIAGNOSIS — R1310 Dysphagia, unspecified: Secondary | ICD-10-CM | POA: Diagnosis not present

## 2014-06-03 DIAGNOSIS — I5023 Acute on chronic systolic (congestive) heart failure: Secondary | ICD-10-CM | POA: Diagnosis not present

## 2014-06-03 DIAGNOSIS — R748 Abnormal levels of other serum enzymes: Secondary | ICD-10-CM | POA: Diagnosis not present

## 2014-06-03 DIAGNOSIS — E785 Hyperlipidemia, unspecified: Secondary | ICD-10-CM | POA: Diagnosis present

## 2014-06-03 DIAGNOSIS — J441 Chronic obstructive pulmonary disease with (acute) exacerbation: Secondary | ICD-10-CM | POA: Diagnosis present

## 2014-06-03 DIAGNOSIS — D126 Benign neoplasm of colon, unspecified: Secondary | ICD-10-CM

## 2014-06-03 DIAGNOSIS — R143 Flatulence: Secondary | ICD-10-CM | POA: Diagnosis not present

## 2014-06-03 DIAGNOSIS — I5021 Acute systolic (congestive) heart failure: Secondary | ICD-10-CM | POA: Diagnosis not present

## 2014-06-03 DIAGNOSIS — Z8673 Personal history of transient ischemic attack (TIA), and cerebral infarction without residual deficits: Secondary | ICD-10-CM | POA: Diagnosis not present

## 2014-06-03 DIAGNOSIS — J189 Pneumonia, unspecified organism: Secondary | ICD-10-CM | POA: Diagnosis not present

## 2014-06-03 DIAGNOSIS — R7989 Other specified abnormal findings of blood chemistry: Secondary | ICD-10-CM

## 2014-06-03 DIAGNOSIS — J9819 Other pulmonary collapse: Secondary | ICD-10-CM | POA: Diagnosis not present

## 2014-06-03 DIAGNOSIS — J9690 Respiratory failure, unspecified, unspecified whether with hypoxia or hypercapnia: Secondary | ICD-10-CM | POA: Insufficient documentation

## 2014-06-03 DIAGNOSIS — I248 Other forms of acute ischemic heart disease: Secondary | ICD-10-CM | POA: Diagnosis present

## 2014-06-03 DIAGNOSIS — Z79899 Other long term (current) drug therapy: Secondary | ICD-10-CM | POA: Diagnosis not present

## 2014-06-03 DIAGNOSIS — K921 Melena: Secondary | ICD-10-CM

## 2014-06-03 DIAGNOSIS — I5022 Chronic systolic (congestive) heart failure: Secondary | ICD-10-CM | POA: Diagnosis present

## 2014-06-03 DIAGNOSIS — E876 Hypokalemia: Secondary | ICD-10-CM | POA: Diagnosis not present

## 2014-06-03 DIAGNOSIS — I2489 Other forms of acute ischemic heart disease: Secondary | ICD-10-CM | POA: Diagnosis present

## 2014-06-03 DIAGNOSIS — N179 Acute kidney failure, unspecified: Secondary | ICD-10-CM | POA: Diagnosis not present

## 2014-06-03 DIAGNOSIS — J9620 Acute and chronic respiratory failure, unspecified whether with hypoxia or hypercapnia: Secondary | ICD-10-CM | POA: Diagnosis present

## 2014-06-03 DIAGNOSIS — M1990 Unspecified osteoarthritis, unspecified site: Secondary | ICD-10-CM | POA: Diagnosis present

## 2014-06-03 DIAGNOSIS — I5033 Acute on chronic diastolic (congestive) heart failure: Secondary | ICD-10-CM | POA: Insufficient documentation

## 2014-06-03 DIAGNOSIS — R778 Other specified abnormalities of plasma proteins: Secondary | ICD-10-CM

## 2014-06-03 DIAGNOSIS — I5043 Acute on chronic combined systolic (congestive) and diastolic (congestive) heart failure: Secondary | ICD-10-CM | POA: Diagnosis not present

## 2014-06-03 DIAGNOSIS — Z4682 Encounter for fitting and adjustment of non-vascular catheter: Secondary | ICD-10-CM | POA: Diagnosis not present

## 2014-06-03 DIAGNOSIS — R195 Other fecal abnormalities: Secondary | ICD-10-CM

## 2014-06-03 DIAGNOSIS — I447 Left bundle-branch block, unspecified: Secondary | ICD-10-CM

## 2014-06-03 DIAGNOSIS — J9601 Acute respiratory failure with hypoxia: Secondary | ICD-10-CM

## 2014-06-03 DIAGNOSIS — R410 Disorientation, unspecified: Secondary | ICD-10-CM

## 2014-06-03 DIAGNOSIS — J811 Chronic pulmonary edema: Secondary | ICD-10-CM | POA: Diagnosis not present

## 2014-06-03 DIAGNOSIS — R079 Chest pain, unspecified: Secondary | ICD-10-CM | POA: Diagnosis not present

## 2014-06-03 DIAGNOSIS — R0989 Other specified symptoms and signs involving the circulatory and respiratory systems: Secondary | ICD-10-CM | POA: Diagnosis not present

## 2014-06-03 DIAGNOSIS — R141 Gas pain: Secondary | ICD-10-CM | POA: Diagnosis not present

## 2014-06-03 LAB — COMPREHENSIVE METABOLIC PANEL
ALBUMIN: 3.7 g/dL (ref 3.5–5.2)
ALBUMIN: 3.6 g/dL (ref 3.5–5.2)
ALK PHOS: 117 U/L (ref 39–117)
ALT: 29 U/L (ref 0–53)
ALT: 27 U/L (ref 0–53)
ANION GAP: 20 — AB (ref 5–15)
AST: 47 U/L — AB (ref 0–37)
AST: 66 U/L — ABNORMAL HIGH (ref 0–37)
Alkaline Phosphatase: 131 U/L — ABNORMAL HIGH (ref 39–117)
Anion gap: 20 — ABNORMAL HIGH (ref 5–15)
BILIRUBIN TOTAL: 0.3 mg/dL (ref 0.3–1.2)
BILIRUBIN TOTAL: 0.4 mg/dL (ref 0.3–1.2)
BUN: 5 mg/dL — ABNORMAL LOW (ref 6–23)
BUN: 7 mg/dL (ref 6–23)
CALCIUM: 8.6 mg/dL (ref 8.4–10.5)
CHLORIDE: 87 meq/L — AB (ref 96–112)
CO2: 18 mEq/L — ABNORMAL LOW (ref 19–32)
CO2: 19 mEq/L (ref 19–32)
CREATININE: 0.57 mg/dL (ref 0.50–1.35)
Calcium: 8.6 mg/dL (ref 8.4–10.5)
Chloride: 88 mEq/L — ABNORMAL LOW (ref 96–112)
Creatinine, Ser: 0.51 mg/dL (ref 0.50–1.35)
GFR calc Af Amer: >90 mL/min (ref >90)
GFR calc Af Amer: >90 mL/min (ref >90)
GFR calc non Af Amer: >90 mL/min (ref >90)
GFR calc non Af Amer: >90 mL/min (ref >90)
Glucose, Bld: 181 mg/dL — ABNORMAL HIGH (ref 70–99)
Glucose, Bld: 182 mg/dL — ABNORMAL HIGH (ref 70–99)
POTASSIUM: 4.2 meq/L (ref 3.7–5.3)
Potassium: 3.8 mEq/L (ref 3.7–5.3)
Sodium: 126 mEq/L — ABNORMAL LOW (ref 137–147)
Sodium: 126 mEq/L — ABNORMAL LOW (ref 137–147)
TOTAL PROTEIN: 7.5 g/dL (ref 6.0–8.3)
Total Protein: 7.3 g/dL (ref 6.0–8.3)

## 2014-06-03 LAB — OSMOLALITY, URINE: Osmolality, Ur: 308 mOsm/kg — ABNORMAL LOW (ref 390–1090)

## 2014-06-03 LAB — CBC
HCT: 34.3 % — ABNORMAL LOW (ref 39.0–52.0)
HEMOGLOBIN: 11.7 g/dL — AB (ref 13.0–17.0)
MCH: 29 pg (ref 26.0–34.0)
MCHC: 34.1 g/dL (ref 30.0–36.0)
MCV: 84.9 fL (ref 78.0–100.0)
Platelets: 170 10*3/uL (ref 150–400)
RBC: 4.04 MIL/uL — ABNORMAL LOW (ref 4.22–5.81)
RDW: 14.1 % (ref 11.5–15.5)
WBC: 12 10*3/uL — ABNORMAL HIGH (ref 4.0–10.5)

## 2014-06-03 LAB — BLOOD GAS, ARTERIAL
ACID-BASE DEFICIT: 10.1 mmol/L — AB (ref 0.0–2.0)
Bicarbonate: 16.1 mEq/L — ABNORMAL LOW (ref 20.0–24.0)
Delivery systems: POSITIVE
Expiratory PAP: 6
FIO2: 0.45 %
INSPIRATORY PAP: 18
O2 Saturation: 94.2 %
Patient temperature: 37
TCO2: 15.2 mmol/L (ref 0–100)
pCO2 arterial: 40.3 mmHg (ref 35.0–45.0)
pH, Arterial: 7.226 — ABNORMAL LOW (ref 7.350–7.450)
pO2, Arterial: 89.4 mmHg (ref 80.0–100.0)

## 2014-06-03 LAB — SALICYLATE LEVEL

## 2014-06-03 LAB — RAPID URINE DRUG SCREEN, HOSP PERFORMED
Amphetamines: NOT DETECTED
BARBITURATES: NOT DETECTED
Benzodiazepines: NOT DETECTED
Cocaine: NOT DETECTED
Opiates: NOT DETECTED
TETRAHYDROCANNABINOL: NOT DETECTED

## 2014-06-03 LAB — URINALYSIS, ROUTINE W REFLEX MICROSCOPIC
BILIRUBIN URINE: NEGATIVE
GLUCOSE, UA: NEGATIVE mg/dL
Hgb urine dipstick: NEGATIVE
KETONES UR: NEGATIVE mg/dL
Leukocytes, UA: NEGATIVE
Nitrite: NEGATIVE
PH: 5 (ref 5.0–8.0)
Protein, ur: NEGATIVE mg/dL
Specific Gravity, Urine: 1.016 (ref 1.005–1.030)
Urobilinogen, UA: 0.2 mg/dL (ref 0.0–1.0)

## 2014-06-03 LAB — APTT: aPTT: 33 seconds (ref 24–37)

## 2014-06-03 LAB — CBC WITH DIFFERENTIAL/PLATELET
BASOS ABS: 0 10*3/uL (ref 0.0–0.1)
Basophils Relative: 0 % (ref 0–1)
Eosinophils Absolute: 0 10*3/uL (ref 0.0–0.7)
Eosinophils Relative: 0 % (ref 0–5)
HEMATOCRIT: 35.2 % — AB (ref 39.0–52.0)
HEMOGLOBIN: 12.4 g/dL — AB (ref 13.0–17.0)
LYMPHS ABS: 0.7 10*3/uL (ref 0.7–4.0)
Lymphocytes Relative: 5 % — ABNORMAL LOW (ref 12–46)
MCH: 29.8 pg (ref 26.0–34.0)
MCHC: 35.2 g/dL (ref 30.0–36.0)
MCV: 84.6 fL (ref 78.0–100.0)
MONO ABS: 1 10*3/uL (ref 0.1–1.0)
Monocytes Relative: 7 % (ref 3–12)
NEUTROS ABS: 12.9 10*3/uL — AB (ref 1.7–7.7)
Neutrophils Relative %: 88 % — ABNORMAL HIGH (ref 43–77)
Platelets: 196 10*3/uL (ref 150–400)
RBC: 4.16 MIL/uL — AB (ref 4.22–5.81)
RDW: 14.2 % (ref 11.5–15.5)
WBC: 14.6 10*3/uL — AB (ref 4.0–10.5)

## 2014-06-03 LAB — POCT I-STAT 3, ART BLOOD GAS (G3+)
Acid-base deficit: 2 mmol/L (ref 0.0–2.0)
Bicarbonate: 22 mEq/L (ref 20.0–24.0)
O2 Saturation: 87 %
PCO2 ART: 36 mmHg (ref 35.0–45.0)
PH ART: 7.395 (ref 7.350–7.450)
TCO2: 23 mmol/L (ref 0–100)
pO2, Arterial: 53 mmHg — ABNORMAL LOW (ref 80.0–100.0)

## 2014-06-03 LAB — OSMOLALITY: OSMOLALITY: 262 mosm/kg — AB (ref 275–300)

## 2014-06-03 LAB — TROPONIN I
TROPONIN I: 2.59 ng/mL — AB (ref <0.30)
TROPONIN I: 4.15 ng/mL — AB (ref <0.30)
Troponin I: 1.46 ng/mL (ref <0.30)

## 2014-06-03 LAB — LACTIC ACID, PLASMA: Lactic Acid, Venous: 3.9 mmol/L — ABNORMAL HIGH (ref 0.5–2.2)

## 2014-06-03 LAB — MRSA PCR SCREENING: MRSA by PCR: NEGATIVE

## 2014-06-03 LAB — ACETAMINOPHEN LEVEL: Acetaminophen (Tylenol), Serum: <15 ug/mL (ref 10–30)

## 2014-06-03 LAB — PROTIME-INR
INR: 1.12 (ref 0.00–1.49)
PROTHROMBIN TIME: 14.4 s (ref 11.6–15.2)

## 2014-06-03 LAB — MAGNESIUM: Magnesium: 1.7 mg/dL (ref 1.5–2.5)

## 2014-06-03 LAB — PROCALCITONIN: PROCALCITONIN: 0.25 ng/mL

## 2014-06-03 LAB — PRO B NATRIURETIC PEPTIDE: Pro B Natriuretic peptide (BNP): 5223 pg/mL — ABNORMAL HIGH (ref 0–125)

## 2014-06-03 LAB — PHOSPHORUS: Phosphorus: 4.1 mg/dL (ref 2.3–4.6)

## 2014-06-03 LAB — STREP PNEUMONIAE URINARY ANTIGEN: Strep Pneumo Urinary Antigen: NEGATIVE

## 2014-06-03 MED ORDER — DEXTROSE 5 % IV SOLN
1.0000 g | INTRAVENOUS | Status: AC
  Administered 2014-06-03 – 2014-06-09 (×7): 1 g via INTRAVENOUS
  Filled 2014-06-03 (×7): qty 10

## 2014-06-03 MED ORDER — DEXTROSE 5 % IV SOLN
500.0000 mg | INTRAVENOUS | Status: DC
  Administered 2014-06-03 – 2014-06-06 (×4): 500 mg via INTRAVENOUS
  Filled 2014-06-03 (×6): qty 500

## 2014-06-03 MED ORDER — LORAZEPAM 2 MG/ML IJ SOLN
0.5000 mg | Freq: Once | INTRAMUSCULAR | Status: AC
  Administered 2014-06-03: 0.5 mg via INTRAVENOUS

## 2014-06-03 MED ORDER — IPRATROPIUM-ALBUTEROL 0.5-2.5 (3) MG/3ML IN SOLN
3.0000 mL | RESPIRATORY_TRACT | Status: DC
  Administered 2014-06-03 – 2014-06-12 (×54): 3 mL via RESPIRATORY_TRACT
  Filled 2014-06-03 (×54): qty 3

## 2014-06-03 MED ORDER — ASPIRIN 81 MG PO CHEW
81.0000 mg | CHEWABLE_TABLET | Freq: Every day | ORAL | Status: DC
  Administered 2014-06-03 – 2014-06-22 (×20): 81 mg via ORAL
  Filled 2014-06-03 (×20): qty 1

## 2014-06-03 MED ORDER — HEPARIN BOLUS VIA INFUSION
4000.0000 [IU] | Freq: Once | INTRAVENOUS | Status: AC
  Administered 2014-06-03: 4000 [IU] via INTRAVENOUS
  Filled 2014-06-03: qty 4000

## 2014-06-03 MED ORDER — LORAZEPAM 2 MG/ML IJ SOLN
INTRAMUSCULAR | Status: AC
  Filled 2014-06-03: qty 1

## 2014-06-03 MED ORDER — ONDANSETRON HCL 4 MG/2ML IJ SOLN: 4.0000 mg | Freq: Once | INTRAMUSCULAR | Status: DC

## 2014-06-03 MED ORDER — FOLIC ACID 5 MG/ML IJ SOLN
1.0000 mg | Freq: Every day | INTRAMUSCULAR | Status: DC
  Administered 2014-06-03 – 2014-06-07 (×5): 1 mg via INTRAVENOUS
  Filled 2014-06-03 (×9): qty 0.2

## 2014-06-03 MED ORDER — ALBUTEROL SULFATE (2.5 MG/3ML) 0.083% IN NEBU
2.5000 mg | INHALATION_SOLUTION | RESPIRATORY_TRACT | Status: DC
  Administered 2014-06-03 (×2): 2.5 mg via RESPIRATORY_TRACT
  Filled 2014-06-03 (×2): qty 3

## 2014-06-03 MED ORDER — BIOTENE DRY MOUTH MT LIQD
15.0000 mL | Freq: Two times a day (BID) | OROMUCOSAL | Status: DC
  Administered 2014-06-03 – 2014-06-05 (×5): 15 mL via OROMUCOSAL

## 2014-06-03 MED ORDER — HEPARIN (PORCINE) IN NACL 100-0.45 UNIT/ML-% IJ SOLN
1400.0000 [IU]/h | INTRAMUSCULAR | Status: DC
  Administered 2014-06-03: 950 [IU]/h via INTRAVENOUS
  Administered 2014-06-04 (×2): 1100 [IU]/h via INTRAVENOUS
  Administered 2014-06-05 – 2014-06-06 (×2): 1400 [IU]/h via INTRAVENOUS
  Filled 2014-06-03 (×7): qty 250

## 2014-06-03 MED ORDER — THIAMINE HCL 100 MG/ML IJ SOLN
100.0000 mg | Freq: Every day | INTRAMUSCULAR | Status: DC
  Administered 2014-06-03 – 2014-06-07 (×5): 100 mg via INTRAVENOUS
  Filled 2014-06-03 (×6): qty 1

## 2014-06-03 MED ORDER — SODIUM CHLORIDE 0.9 % IV SOLN
INTRAVENOUS | Status: DC
  Administered 2014-06-03 – 2014-06-14 (×2): via INTRAVENOUS

## 2014-06-03 MED ORDER — HEPARIN SODIUM (PORCINE) 5000 UNIT/ML IJ SOLN
5000.0000 [IU] | Freq: Three times a day (TID) | INTRAMUSCULAR | Status: DC
  Administered 2014-06-03: 5000 [IU] via SUBCUTANEOUS
  Filled 2014-06-03 (×3): qty 1

## 2014-06-03 MED ORDER — METHYLPREDNISOLONE SODIUM SUCC 125 MG IJ SOLR
INTRAMUSCULAR | Status: AC
  Administered 2014-06-03: 125 mg via INTRAVENOUS
  Filled 2014-06-03: qty 2

## 2014-06-03 MED ORDER — METHYLPREDNISOLONE SODIUM SUCC 40 MG IJ SOLR
40.0000 mg | Freq: Three times a day (TID) | INTRAMUSCULAR | Status: DC
  Administered 2014-06-03 – 2014-06-04 (×4): 40 mg via INTRAVENOUS
  Filled 2014-06-03 (×6): qty 1

## 2014-06-03 MED ORDER — FUROSEMIDE 10 MG/ML IJ SOLN
40.0000 mg | Freq: Once | INTRAMUSCULAR | Status: AC
  Administered 2014-06-03: 40 mg via INTRAVENOUS
  Filled 2014-06-03: qty 4

## 2014-06-03 MED ORDER — METHYLPREDNISOLONE SODIUM SUCC 125 MG IJ SOLR
125.0000 mg | Freq: Once | INTRAMUSCULAR | Status: AC
  Administered 2014-06-03: 125 mg via INTRAVENOUS

## 2014-06-03 MED ORDER — FUROSEMIDE 10 MG/ML IJ SOLN
40.0000 mg | Freq: Two times a day (BID) | INTRAMUSCULAR | Status: AC
  Administered 2014-06-03 – 2014-06-04 (×2): 40 mg via INTRAVENOUS
  Filled 2014-06-03 (×2): qty 4

## 2014-06-03 MED ORDER — PANTOPRAZOLE SODIUM 40 MG IV SOLR
40.0000 mg | Freq: Every day | INTRAVENOUS | Status: DC
  Administered 2014-06-03 – 2014-06-05 (×3): 40 mg via INTRAVENOUS
  Filled 2014-06-03 (×3): qty 40

## 2014-06-03 MED ORDER — IPRATROPIUM-ALBUTEROL 0.5-2.5 (3) MG/3ML IN SOLN
RESPIRATORY_TRACT | Status: AC
  Administered 2014-06-03: 3 mL
  Filled 2014-06-03: qty 6

## 2014-06-03 MED ORDER — LORAZEPAM 2 MG/ML IJ SOLN
0.5000 mg | INTRAMUSCULAR | Status: DC | PRN
  Administered 2014-06-04 (×2): 1 mg via INTRAVENOUS
  Filled 2014-06-03 (×2): qty 1

## 2014-06-03 MED ORDER — ONDANSETRON HCL 4 MG/2ML IJ SOLN
4.0000 mg | Freq: Once | INTRAMUSCULAR | Status: AC
  Administered 2014-06-03: 4 mg via INTRAVENOUS

## 2014-06-03 MED ORDER — ONDANSETRON HCL 4 MG/2ML IJ SOLN
INTRAMUSCULAR | Status: AC
  Filled 2014-06-03: qty 2

## 2014-06-03 MED ORDER — IPRATROPIUM BROMIDE 0.02 % IN SOLN
0.5000 mg | RESPIRATORY_TRACT | Status: DC
  Administered 2014-06-03 (×2): 0.5 mg via RESPIRATORY_TRACT
  Filled 2014-06-03 (×2): qty 2.5

## 2014-06-03 NOTE — Progress Notes (Signed)
ANTIBIOTIC CONSULT NOTE - INITIAL  Pharmacy Consult for ceftriaxone Indication: pneumonia  No Known Allergies  Patient Measurements:    Vital Signs: Temp: 97.5 F (36.4 C) (07/09 1020) Temp src: Axillary (07/09 1020) BP: 132/62 mmHg (07/09 1237) Pulse Rate: 98 (07/09 1230) Intake/Output from previous day:   Intake/Output from this shift:    Labs:  Recent Labs  06/03/14 1011  WBC 14.6*  HGB 12.4*  PLT 196  CREATININE 0.57   The CrCl is unknown because both a height and weight (above a minimum accepted value) are required for this calculation. No results found for this basename: VANCOTROUGH, VANCOPEAK, VANCORANDOM, GENTTROUGH, GENTPEAK, GENTRANDOM, TOBRATROUGH, TOBRAPEAK, TOBRARND, AMIKACINPEAK, AMIKACINTROU, AMIKACIN,  in the last 72 hours   Microbiology: No results found for this or any previous visit (from the past 720 hour(s)).  Medical History: Past Medical History  Diagnosis Date  . Cerebrovascular disease 2009    TIA; 2009- right ICA stent; re-intervention for restenosis complicated by [facility] w/o sx  . Degenerative joint disease     Total shoulder arthroplasty-right  . LBBB (left bundle branch block)     Normal echo-2011; stress nuclear in 09/2010--septal hypoperfusion representing nontransmural infarction or the effect of left bundle branch block, no ischemia  . Hyperlipidemia   . Hypertension   . Chronic obstructive pulmonary disease   . Tobacco abuse     -100 pack years; 1.5 packs per day  . Anxiety and depression   . Alcohol abuse     6 beers per day; hospital admission in 2009 for withdrawal symptoms  . Thrombocytopenia   . Tubular adenoma of colon   Assessment: 68 year old male admitted with respiratory distress. Pharmacy asked to dose ceftriaxone for CAP. No fevers, wbc 14, scr normal.  Goal of Therapy:  Eradication of infection  Plan:  Follow up culture results Ceftriaxone 1g q day  Georgina Peer 06/03/2014,2:27 PM

## 2014-06-03 NOTE — Progress Notes (Signed)
ANTICOAGULATION CONSULT NOTE - Initial Consult  Pharmacy Consult for heparin Indication: chest pain/ACS  No Known Allergies  Patient Measurements: Height: 5\' 6"  (167.6 cm) Weight: 153 lb 14.1 oz (69.8 kg) IBW/kg (Calculated) : 63.8  Vital Signs: Temp: 98.5 F (36.9 C) (07/09 1356) Temp src: Oral (07/09 1356) BP: 129/64 mmHg (07/09 1600) Pulse Rate: 109 (07/09 1600)  Labs:  Recent Labs  06/03/14 1011 06/03/14 1500  HGB 12.4* 11.7*  HCT 35.2* 34.3*  PLT 196 170  CREATININE 0.57  --   TROPONINI 1.46*  --     Estimated Creatinine Clearance: 80.9 ml/min (by C-G formula based on Cr of 0.57).   Medical History: Past Medical History  Diagnosis Date  . Cerebrovascular disease 2009    TIA; 2009- right ICA stent; re-intervention for restenosis complicated by [facility] w/o sx  . Degenerative joint disease     Total shoulder arthroplasty-right  . LBBB (left bundle branch block)     Normal echo-2011; stress nuclear in 09/2010--septal hypoperfusion representing nontransmural infarction or the effect of left bundle branch block, no ischemia  . Hyperlipidemia   . Hypertension   . Chronic obstructive pulmonary disease   . Tobacco abuse     -100 pack years; 1.5 packs per day  . Anxiety and depression   . Alcohol abuse     6 beers per day; hospital admission in 2009 for withdrawal symptoms  . Thrombocytopenia   . Tubular adenoma of colon     Medications:  Prescriptions prior to admission  Medication Sig Dispense Refill  . amLODipine-benazepril (LOTREL) 5-20 MG per capsule Take 1 capsule by mouth 2 (two) times daily.       [name] aspirin EC 81 MG tablet Take 81 mg by mouth every evening.       [name] atorvastatin (LIPITOR) 20 MG tablet Take 20 mg by mouth daily.       . cloNIDine (CATAPRES - DOSED IN MG/24 HR) 0.1 mg/24hr patch Place 0.1 mg onto the skin once a week. Monday      . clopidogrel (PLAVIX) 75 MG tablet Take 75 mg by mouth every evening.       . Coenzyme Q10 (CO Q 10) 100 MG  CAPS Take 100 mg by mouth every evening.       . folic acid (FOLVITE) 742 MCG tablet Take 800 mcg by mouth daily.      [name] Oil 300 MG CAPS Take 300 mg by mouth every evening.       . loratadine (CLARITIN) 10 MG tablet Take 10 mg by mouth.       [name] LORazepam (ATIVAN) 1 MG tablet Take 1 mg by mouth 3 (three) times daily.       . Multiple Vitamins-Minerals (CENTRUM SILVER PO) Take 0.5 tablets by mouth 2 (two) times daily.       . pantoprazole (PROTONIX) 40 MG tablet Take 40 mg by mouth 2 (two) times daily.      [name] PARoxetine (PAXIL) 20 MG tablet Take 20 mg by mouth daily.      . Prenatal MV-Min-Fe Fum-FA-DHA (PRENATAL 1 PO) Take 1 tablet by mouth daily.      . pseudoephedrine-guaifenesin (MUCINEX D) 60-600 MG per tablet Take 1 tablet by mouth every 12 (twelve) hours as needed for congestion.      [name] pyridOXINE (VITAMIN B-6) 100 MG tablet Take 100 mg by mouth daily.      . Thiamine HCl (VITAMIN B-1) 250 MG tablet Take 125 mg  by mouth daily.       . vitamin C (ASCORBIC ACID) 500 MG tablet Take 500 mg by mouth daily.         Scheduled:  . albuterol  2.5 mg Nebulization Q4H  . antiseptic oral rinse  15 mL Mouth Rinse BID  . aspirin  81 mg Oral Daily  . azithromycin  500 mg Intravenous Q24H  . cefTRIAXone (ROCEPHIN)  IV  1 g Intravenous Q24H  . folic acid  1 mg Intravenous Daily  . furosemide  40 mg Intravenous Q12H  . ipratropium  0.5 mg Nebulization Q4H  . methylPREDNISolone (SOLU-MEDROL) injection  40 mg Intravenous 3 times per day  . pantoprazole (PROTONIX) IV  40 mg Intravenous QHS  . thiamine IV  100 mg Intravenous Daily   Infusions:  . sodium chloride      Assessment: 68 yo male to begin heparin for r/o ACS.  Hg/hct= 11.7/34.3 and plt= 170.   Goal of Therapy:  Heparin level 0.3-0.7 units/ml Monitor platelets by anticoagulation protocol: Yes   Plan:  -Heparin bolus 4000 units IV followed by 950 units/hr (~ 14 units/kg/hr) -Heparin level in 6 hours and daily wth CBC  daily  Hildred Laser, Pharm D 06/03/2014 4:56 PM

## 2014-06-03 NOTE — ED Notes (Signed)
CRITICAL VALUE ALERT  Critical value received:  Troponin 1.46  Date of notification:  06/03/14  Time of notification:  0034  Critical value read back:Yes.    Nurse who received alert:  Felicity Coyer, RN  MD notified (1st page):    Time of first page:    MD notified (2nd page):  Time of second page:  Responding MD:  Dr. Nat Christen  Time MD responded:  [id number]

## 2014-06-03 NOTE — Consult Note (Addendum)
CARDIOLOGY CONSULT NOTE  Patient ID: Roy [name], MRN: [id number], DOB/AGE: [birth year] 68 y.o. Admit date: 06/03/2014 Date of Consult: 06/03/2014  Primary Physician: [name], MD Primary Cardiologist: previously Dr Lattie Haw Referring Physician: Dr Lacinda Axon [facility] ED)  Chief Complaint: Chest pain, Shortness of Breath Reason for Consultation: CHF, elevated troponin  HPI: Roy [name] is a 68 year old gentleman with history of heavy tobacco and alcohol use. He was in his usual state of health until yesterday when he experienced some chest discomfort in the left chest. He describes this as a "hard pain." The duration of pain was approximately 30 minutes and it resolved spontaneously. He's had no recurrence since yesterday. However, he woke up early this morning with severe shortness of breath. EMS was called and he was transported to [facility] are he was placed on BiPAP. His symptoms improved. He initially was noted to have oxygen saturations in the low 70s. The patient was transported here because of continued respiratory failure and elevated cardiac troponins.  The patient smokes 2-3 packs of cigarettes daily. He also drinks a 12 pack of beer daily. He has a history of cerebrovascular disease and intracerebral stenting. He's been on long-term Plavix. He has no documented history of coronary disease. He also has peripheral arterial disease and has undergone iliac stenting.  The patient denies chest pain at present. He does report a cough that is quite severe at times. He denies fevers or chills.  Past Medical History  Diagnosis Date  . Cerebrovascular disease 2009    TIA; 2009- right ICA stent; re-intervention for restenosis complicated by [facility] w/o sx  . Degenerative joint disease     Total shoulder arthroplasty-right  . LBBB (left bundle branch block)     Normal echo-2011; stress nuclear in 09/2010--septal hypoperfusion representing nontransmural infarction or the effect of left  bundle branch block, no ischemia  . Hyperlipidemia   . Hypertension   . Chronic obstructive pulmonary disease   . Tobacco abuse     -100 pack years; 1.5 packs per day  . Anxiety and depression   . Alcohol abuse     6 beers per day; hospital admission in 2009 for withdrawal symptoms  . Thrombocytopenia   . Tubular adenoma of colon       Surgical History:  Past Surgical History  Procedure Laterality Date  . Lumbar fusion  2010  . Vasectomy  1971  . Colonoscopy w/ polypectomy  2011  . Total shoulder arthroplasty  2011    Right-Dr. Tamera Punt  . Colonoscopy  08/22/09    Fields-(Tubular Adenoma)3-mm transverse polyp/4-mm polyp otherwise noraml/small internal hemorrhoids  . Esophagogastroduodenoscopy [name] 03/05/2014    Procedure: ESOPHAGOGASTRODUODENOSCOPY (EGD);  Surgeon: Danie Binder, MD;  Location: [facility];  Service: Endoscopy;  Laterality: [name];  11:45  . Agile capsule [name] 03/18/2014    Procedure: AGILE CAPSULE;  Surgeon: Danie Binder, MD;  Location: [facility];  Service: Endoscopy;  Laterality: [name];  7:30  . Givens capsule study [name] 03/30/2014    Procedure: GIVENS CAPSULE STUDY;  Surgeon: Danie Binder, MD;  Location: [facility];  Service: Endoscopy;  Laterality: [name];  7:30  . Colonoscopy [name] 04/14/2014    Procedure: COLONOSCOPY;  Surgeon: Danie Binder, MD;  Location: [facility];  Service: Endoscopy;  Laterality: [name];  9:30     Home Meds: Prior to Admission medications   Medication Sig Start Date End Date Taking? Authorizing Provider  amLODipine-benazepril (LOTREL) 5-20 MG  per capsule Take 1 capsule by mouth 2 (two) times daily.    Yes Historical Provider, MD  aspirin EC 81 MG tablet Take 81 mg by mouth every evening.    Yes Historical Provider, MD  atorvastatin (LIPITOR) 20 MG tablet Take 20 mg by mouth daily.    Yes Historical Provider, MD  cloNIDine (CATAPRES - DOSED IN MG/24 HR) 0.1 mg/24hr patch Place 0.1 mg onto the skin once a week. Monday   Yes Historical  Provider, MD  clopidogrel (PLAVIX) 75 MG tablet Take 75 mg by mouth every evening.    Yes Historical Provider, MD  Coenzyme Q10 (CO Q 10) 100 MG CAPS Take 100 mg by mouth every evening.    Yes Historical Provider, MD  folic acid (FOLVITE) 272 MCG tablet Take 800 mcg by mouth daily.   Yes Historical Provider, MD  [name] Oil 300 MG CAPS Take 300 mg by mouth every evening.    Yes Historical Provider, MD  loratadine (CLARITIN) 10 MG tablet Take 10 mg by mouth.    Yes Historical Provider, MD  LORazepam (ATIVAN) 1 MG tablet Take 1 mg by mouth 3 (three) times daily.    Yes Historical Provider, MD  Multiple Vitamins-Minerals (CENTRUM SILVER PO) Take 0.5 tablets by mouth 2 (two) times daily.    Yes Historical Provider, MD  pantoprazole (PROTONIX) 40 MG tablet Take 40 mg by mouth 2 (two) times daily.   Yes Historical Provider, MD  PARoxetine (PAXIL) 20 MG tablet Take 20 mg by mouth daily.   Yes Historical Provider, MD  Prenatal MV-Min-Fe Fum-FA-DHA (PRENATAL 1 PO) Take 1 tablet by mouth daily.   Yes Historical Provider, MD  pseudoephedrine-guaifenesin (MUCINEX D) 60-600 MG per tablet Take 1 tablet by mouth every 12 (twelve) hours as needed for congestion.   Yes Historical Provider, MD  pyridOXINE (VITAMIN B-6) 100 MG tablet Take 100 mg by mouth daily.   Yes Historical Provider, MD  Thiamine HCl (VITAMIN B-1) 250 MG tablet Take 125 mg by mouth daily.    Yes Historical Provider, MD  vitamin C (ASCORBIC ACID) 500 MG tablet Take 500 mg by mouth daily.     Yes Historical Provider, MD    Inpatient Medications:  . albuterol  2.5 mg Nebulization Q4H  . antiseptic oral rinse  15 mL Mouth Rinse BID  . azithromycin  500 mg Intravenous Q24H  . cefTRIAXone (ROCEPHIN)  IV  1 g Intravenous Q24H  . folic acid  1 mg Intravenous Daily  . furosemide  40 mg Intravenous Q12H  . heparin  5,000 Units Subcutaneous 3 times per day  . ipratropium  0.5 mg Nebulization Q4H  . methylPREDNISolone (SOLU-MEDROL) injection  40 mg  Intravenous 3 times per day  . pantoprazole (PROTONIX) IV  40 mg Intravenous QHS  . thiamine IV  100 mg Intravenous Daily   . sodium chloride      Allergies: No Known Allergies  History   Social History  . Marital Status: Married    Spouse Name: [name]    Number of Children: 2  . Years of Education: [name]   Occupational History  . 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     Social History Main Topics  . Smoking status: Current Every Day Smoker -- 1.00 packs/day for 60 years    Types: Cigarettes  . Smokeless tobacco: Never Used  . Alcohol Use: 36.0 oz/week    60 Cans of beer per week     Comment:  12 pack in a week (1/2 can of beer per day) x 60years  . Drug Use: No  . Sexual Activity: No   Other Topics Concern  . Not on file   Social History Narrative   Accompanied by daughter [name]   Lives w/ wife, daughter     Family History  Problem Relation Age of Onset  . Prostate cancer Brother   . Coronary artery disease Mother   . Diabetes Mother   . Heart disease Mother   . Hypertension Mother   . Heart attack Mother   . Alzheimer's disease Sister   . Alzheimer's disease Father   . Diabetes Father   . Colon cancer Neg Hx      Review of Systems: General: negative for chills, fever, night sweats or weight changes. Positive for shortness of breath, generalized weakness, exercise intolerance. ENT: negative for rhinorrhea or epistaxis Cardiovascular: See history of present illness  Dermatological: negative for rash Respiratory: Positive for cough and shortness of breath GI: negative for nausea, vomiting, diarrhea, bright red blood per rectum, melena, or hematemesis GU: no hematuria, urgency, or frequency Neurologic: negative for visual changes, syncope, headache, or dizziness Heme: no easy bruising or bleeding Endo: negative for excessive thirst, thyroid disorder, or flushing Musculoskeletal: negative for joint pain or swelling, negative for myalgias  All  other systems reviewed and are otherwise negative except as noted above.  Physical Exam: Blood pressure 116/65, pulse 105, temperature 98.5 F (36.9 C), temperature source Oral, resp. rate 36, height 5\' 6"  (1.676 m), weight 69.8 kg (153 lb 14.1 oz), SpO2 86.00%. General: Chronically ill-appearing, pale gentleman in mild respiratory distress HEENT: Normocephalic, atraumatic, sclera non-icteric, no xanthomas, nares are without discharge.  Neck: Supple. Carotids 2+ without bruits. JVP normal Lungs: Very poor air movement throughout  Heart: Distant heart sounds, RRR with normal S1 and S2. No murmurs, rubs, or gallops appreciated. Abdomen: Soft, non-tender, non-distended with normoactive bowel sounds. No hepatomegaly. No rebound/guarding. No obvious abdominal masses. Back: No CVA tenderness Msk:  Strength and tone appear normal for age. Extremities: Trace pretibial edema bilaterally.   Neuro: CNII-XII intact, moves all extremities spontaneously. Strength intact and equal bilaterally Psych:  Responds to questions appropriately with a normal affect.    Labs:  Recent Labs  06/03/14 1011  TROPONINI 1.46*   Lab Results  Component Value Date   WBC 14.6* 06/03/2014   HGB 12.4* 06/03/2014   HCT 35.2* 06/03/2014   MCV 84.6 06/03/2014   PLT 196 06/03/2014    Recent Labs Lab 06/03/14 1011  NA 126*  K 3.8  CL 88*  CO2 18*  BUN 5*  CREATININE 0.57  CALCIUM 8.6  PROT 7.5  BILITOT 0.3  ALKPHOS 131*  ALT 29  AST 47*  GLUCOSE 181*   Lab Results  Component Value Date   CHOL 177 01/08/2012   HDL 53 10/18/2010   LDLCALC  Value: 35        Total Cholesterol/HDL:CHD Risk Coronary Heart Disease Risk Table                     Men   Women  1/2 Average Risk   3.4   3.3  Average Risk       5.0   4.4  2 X Average Risk   9.6   7.1  3 X Average Risk  23.4   11.0        Use the calculated Patient Ratio above and the CHD  Risk Table to determine the patient's CHD Risk.        ATP III CLASSIFICATION (LDL):  <100      mg/dL   Optimal  100-129  mg/dL   Near or Above                    Optimal  130-159  mg/dL   Borderline  160-189  mg/dL   High  >190     mg/dL   Very High 10/18/2010   TRIG 40 10/18/2010   No results found for this basename: DDIMER    Radiology/Studies:  Dg Chest Portable 1 View  06/03/2014   CLINICAL DATA:  Respiratory distress.  EXAM: PORTABLE CHEST - 1 VIEW  COMPARISON:  Chest x-ray 09/25/2010 .  FINDINGS: Cardiomegaly with pulmonary vascular prominence and interstitial prominence present consistent we congestive heart failure and pulmonary edema. No focal alveolar infiltrate. No pleural effusion or pneumothorax. Right shoulder replacement.  IMPRESSION: Congestive heart failure with pulmonary interstitial edema.   Electronically Signed   By: Marcello Moores  Register   On: 06/03/2014 10:09    EKG: Sinus rhythm with left bundle branch block  Cardiac Studies: 2D Echo Pending  ASSESSMENT AND PLAN:  1. Acute on chronic respiratory failure 2. Acute congestive heart failure, presumably diastolic 3. Heavy tobacco use with known COPD 4. Alcoholism 5. Cerebrovascular disease with history of stroke and intracerebral stenting 6. Peripheral arterial disease  Suspect the elevated troponin is related to arterial hypoxemia and demand ischemia. However, the patient is certainly at risk for coronary artery disease with his multiple comorbid conditions, known PAD, cerebrovascular disease, and heavy tobacco use. Will cycle cardiac enzymes, treat with IV heparin, and a check an echocardiogram. Also will treat pulmonary edema with IV diuretic therapy. Will consider cardiac catheterization once he stabilizes from a respiratory perspective. However, I think this will likely be at least a few days. Appreciate management of the CCM team. No beta-blocker secondary to acute exacerbation lung disease.  Signed, Sherren Mocha MD 06/03/2014, 4:10 PM

## 2014-06-03 NOTE — Progress Notes (Signed)
Utilization Review Completed.Donne Anon T7/07/2014

## 2014-06-03 NOTE — ED Provider Notes (Signed)
CSN: [id number]     Arrival date & time 06/03/14  8242 History  This chart was scribed for Nat Christen, MD by Elby Beck, ED Scribe. This patient was seen in room APA06/APA06 and the patient's care was started at 9:45 AM.   Chief Complaint  Patient presents with  . Respiratory Distress    The history is provided by the patient, the spouse, a relative and the EMS personnel. The history is limited by the condition of the patient (respiratory distress). No language interpreter was used.   LEVEL 5 CAVEAT (Respiratory Distress)  HPI Comments: Roy [name] is a 68 y.o. Male with a history of COPD brought by EMS to the Emergency Department for respiratory distress. Per EMS, pt's pulse oximetry was 79% on their arrival to his home. Per EMS, pt began feeling SOB at 3:00 AM this morning, and his SOB has been worsening. EMS reports that they have started the pt on CPAP and his oxygen saturation has been as high as 90%. Pt states that the CPAP seems to be offering some relief. Pt was switched from CPAP to BIPAP in the ED. Per EMS, pt has been noted at times to have diminished breath sounds on the right and on the left. Pt states that he has never had respiratory distress as severe as he is currently having. He states that he has never required intubation. Per EMS, pt lives at home with his wife. Pt denies having any medication allergies.   Per wife, who is now present, pt became severely SOB while sitting in his recliner about 1 hour ago. Wife agrees that pt has never had SOB this severe in the past. Wife reports that pt is a current every day smoker of 2 packs/day, and he has been smoking for 60 years. She also states that pt has a drinking problem and that he drinks about 1 case of beer every 2 days. Wife reports that pt occasionally has flare-ups of bronchitis. Wife states that pt has not been sick with a cold or any other symptoms recently to her knowledge. Wife states that pt is not on oxygen at home.  Wife reports that pt recently found out that he has "2 spots on his lungs", but there has been no further follow-up on this.    Past Medical History  Diagnosis Date  . Cerebrovascular disease 2009    TIA; 2009- right ICA stent; re-intervention for restenosis complicated by [facility] w/o sx  . Degenerative joint disease     Total shoulder arthroplasty-right  . LBBB (left bundle branch block)     Normal echo-2011; stress nuclear in 09/2010--septal hypoperfusion representing nontransmural infarction or the effect of left bundle branch block, no ischemia  . Hyperlipidemia   . Hypertension   . Chronic obstructive pulmonary disease   . Tobacco abuse     -100 pack years; 1.5 packs per day  . Anxiety and depression   . Alcohol abuse     6 beers per day; hospital admission in 2009 for withdrawal symptoms  . Thrombocytopenia   . Tubular adenoma of colon    Past Surgical History  Procedure Laterality Date  . Lumbar fusion  2010  . Vasectomy  1971  . Colonoscopy w/ polypectomy  2011  . Total shoulder arthroplasty  2011    Right-Dr. Tamera Punt  . Colonoscopy  08/22/09    Fields-(Tubular Adenoma)3-mm transverse polyp/4-mm polyp otherwise noraml/small internal hemorrhoids  . Esophagogastroduodenoscopy [name] 03/05/2014    Procedure: ESOPHAGOGASTRODUODENOSCOPY (EGD);  Surgeon: Danie Binder, MD;  [facility];  Service: Endoscopy;  Laterality: [name];  11:45  . Agile capsule [name] 03/18/2014    Procedure: AGILE CAPSULE;  Surgeon: Danie Binder, MD;  [facility];  Service: Endoscopy;  Laterality: [name];  7:30  . Givens capsule study [name] 03/30/2014    Procedure: GIVENS CAPSULE STUDY;  Surgeon: Danie Binder, MD;  [facility];  Service: Endoscopy;  Laterality: [name];  7:30  . Colonoscopy [name] 04/14/2014    Procedure: COLONOSCOPY;  Surgeon: Danie Binder, MD;  [facility];  Service: Endoscopy;  Laterality: [name];  9:30   Family History  Problem Relation Age of Onset  .  Prostate cancer Brother   . Coronary artery disease Mother   . Diabetes Mother   . Heart disease Mother   . Hypertension Mother   . Heart attack Mother   . Alzheimer's disease Sister   . Alzheimer's disease Father   . Diabetes Father   . Colon cancer Neg Hx    History  Substance Use Topics  . Smoking status: Current Every Day Smoker -- 1.00 packs/day for 60 years    Types: Cigarettes  . Smokeless tobacco: Never Used  . Alcohol Use: 36.0 oz/week    60 Cans of beer per week     Comment: 12 pack in a week (1/2 can of beer per day) x 60years    Review of Systems  Unable to perform ROS: Severe respiratory distress    Allergies  Review of patient's allergies indicates no known allergies.  Home Medications   Prior to Admission medications   Medication Sig Start Date End Date Taking? Authorizing Provider  amLODipine-benazepril (LOTREL) 5-20 MG per capsule Take 1 capsule by mouth 2 (two) times daily.     Historical Provider, MD  aspirin EC 81 MG tablet Take 81 mg by mouth every evening.     Historical Provider, MD  atorvastatin (LIPITOR) 20 MG tablet Take 20 mg by mouth daily.     Historical Provider, MD  cloNIDine (CATAPRES - DOSED IN MG/24 HR) 0.1 mg/24hr patch Place 0.1 mg onto the skin once a week. Monday    Historical Provider, MD  clopidogrel (PLAVIX) 75 MG tablet Take 75 mg by mouth every evening.     Historical Provider, MD  Coenzyme Q10 (CO Q 10) 100 MG CAPS Take 100 mg by mouth every evening.     Historical Provider, MD  folic acid (FOLVITE) 595 MCG tablet Take 800 mcg by mouth daily.    Historical Provider, MD  [name] Oil 300 MG CAPS Take 300 mg by mouth every evening.     Historical Provider, MD  loratadine (CLARITIN) 10 MG tablet Take 10 mg by mouth.     Historical Provider, MD  LORazepam (ATIVAN) 1 MG tablet Take 1 mg by mouth every 8 (eight) hours as needed for anxiety (tremors).     Historical Provider, MD  Multiple Vitamins-Minerals (CENTRUM SILVER PO) Take 0.5  tablets by mouth 2 (two) times daily.     Historical Provider, MD  pantoprazole (PROTONIX) 40 MG tablet Take 40 mg by mouth 2 (two) times daily.    Historical Provider, MD  Prenatal MV-Min-Fe Fum-FA-DHA (PRENATAL 1 PO) Take 1 tablet by mouth daily.    Historical Provider, MD  pseudoephedrine-guaifenesin (MUCINEX D) 60-600 MG per tablet Take 1 tablet by mouth every 12 (twelve) hours as needed for congestion.    Historical Provider, MD  pyridOXINE (VITAMIN B-6) 100 MG tablet Take 100 mg by mouth daily.    Historical Provider, MD  Thiamine HCl (VITAMIN B-1) 250 MG tablet Take 125 mg by mouth daily.     Historical Provider, MD  vitamin C (ASCORBIC ACID) 500 MG tablet Take 500 mg by mouth daily.      Historical Provider, MD   BP 116/70  Pulse 97  Temp(Src) 97.5 F (36.4 C) (Axillary)  Resp 31  SpO2 97%  Physical Exam  Nursing note and vitals reviewed. Constitutional: He is oriented to person, place, and time. He appears well-developed and well-nourished.  Pale, tachypneic  HENT:  Head: Normocephalic and atraumatic.  Eyes: Conjunctivae and EOM are normal. Pupils are equal, round, and reactive to light.  Neck: Normal range of motion. Neck supple.  Cardiovascular: Normal rate, regular rhythm and normal heart sounds.   Pulmonary/Chest: Effort normal. He has wheezes.  Decreased breath sounds bilaterally with expiratory wheezing  Abdominal: Soft. Bowel sounds are normal.  Musculoskeletal: Normal range of motion.  Neurological: He is alert and oriented to person, place, and time.  Skin: Skin is warm and dry. There is pallor.  Psychiatric: He has a normal mood and affect. His behavior is normal.    ED Course  Procedures (including critical care time)  DIAGNOSTIC STUDIES: Oxygen Saturation is 85% on BIPAP, low by my interpretation.    COORDINATION OF CARE: 9:54 AM- Discussed pt's case with his wife and son, including plan for admission. Will order a CXR, IV steroids and Albuterol/Atrovent.  Pt and family advised of plan for treatment and pt and family agree.  Labs Review Labs Reviewed  COMPREHENSIVE METABOLIC PANEL - Abnormal; Notable for the following:    Sodium 126 (*)    Chloride 88 (*)    CO2 18 (*)    Glucose, Bld 181 (*)    BUN 5 (*)    AST 47 (*)    Alkaline Phosphatase 131 (*)    Anion gap 20 (*)    All other components within normal limits  CBC WITH DIFFERENTIAL - Abnormal; Notable for the following:    WBC 14.6 (*)    RBC 4.16 (*)    Hemoglobin 12.4 (*)    HCT 35.2 (*)    Neutrophils Relative % 88 (*)    Neutro Abs 12.9 (*)    Lymphocytes Relative 5 (*)    All other components within normal limits  TROPONIN I - Abnormal; Notable for the following:    Troponin I 1.46 (*)    All other components within normal limits  PRO B NATRIURETIC PEPTIDE - Abnormal; Notable for the following:    Pro B Natriuretic peptide (BNP) 5223.0 (*)    All other components within normal limits  BLOOD GAS, ARTERIAL - Abnormal; Notable for the following:    pH, Arterial 7.226 (*)    Bicarbonate 16.1 (*)    Acid-base deficit 10.1 (*)    All other components within normal limits    Imaging Review Dg Chest Portable 1 View  06/03/2014   CLINICAL DATA:  Respiratory distress.  EXAM: PORTABLE CHEST - 1 VIEW  COMPARISON:  Chest x-ray 09/25/2010 .  FINDINGS: Cardiomegaly with pulmonary vascular prominence and interstitial prominence present consistent we congestive heart failure and pulmonary edema. No focal alveolar infiltrate. No pleural effusion or pneumothorax. Right shoulder replacement.  IMPRESSION: Congestive heart failure with pulmonary interstitial edema.   Electronically Signed   By: Marcello Moores  Register   On: 06/03/2014 10:09  EKG Interpretation   Date/Time:  Thursday June 03 2014 09:45:11 EDT Ventricular Rate:  106 PR Interval:  141 QRS Duration: 185 QT Interval:  443 QTC Calculation: 588 R Axis:   64 Text Interpretation:  Sinus tachycardia IVCD, consider atypical LBBB   Confirmed by Gelsey Amyx  MD, Terrez Ander ([id number]) on 06/03/2014 10:05:46 AM     CRITICAL CARE Performed by: Nat Christen  ?  Total critical care time:45  Critical care time was exclusive of separately billable procedures and treating other patients.  Critical care was necessary to treat or prevent imminent or life-threatening deterioration.  Critical care was time spent personally by me on the following activities: development of treatment plan with patient and/or surrogate as well as nursing, discussions with consultants, evaluation of patient's response to treatment, examination of patient, obtaining history from patient or surrogate, ordering and performing treatments and interventions, ordering and review of laboratory studies, ordering and review of radiographic studies, pulse oximetry and re-evaluation of patient's condition. MDM   Final diagnoses:  Acute respiratory failure with hypoxia  Elevated troponin  Chronic obstructive pulmonary disease, unspecified COPD, unspecified chronic bronchitis type  Alcohol abuse    Patient with known COPD and alcohol abuse presents with profound respiratory distress. BiPAP initiated immediately which isn't proved patient's respiratory status.  EKG negative for acute MI. Chest x-ray shows pulmonary edema. IV Lasix administered. Initial troponin 1.46.    Will consult cardiologist at [facility]   I personally performed the services described in this documentation, which was scribed in my presence. The recorded information has been reviewed and is accurate.   Nat Christen, MD 06/03/14 1052

## 2014-06-03 NOTE — ED Notes (Signed)
[facility] ICU to give report; nurse unable to receive report at this time; Secretary reported that ICU nurse will call back.

## 2014-06-03 NOTE — H&P (Signed)
PULMONARY / CRITICAL CARE MEDICINE   Name: Roy [name] MRN: [id number] DOB: 1946-03-26    ADMISSION DATE:  06/03/2014 CONSULTATION DATE:  06/03/2014  REFERRING MD :  Lacinda Axon ([facility]) PRIMARY SERVICE: PCCM  CHIEF COMPLAINT:  SOB, Respiratory Distress  BRIEF PATIENT DESCRIPTION: Patient is a 68 year old male, current smoker (120 pack years) with PMH of COPD, alcohol abuse, chronic bronchitis, TIA, HTN, LBBB who presented to [facility] ED via EMS 7/9 for progressively worsening SOB and respiratory distress. Per EMS, sats in 70s on arrival to pt's home. Patient was started on CPAP and transitioned to BiPAP in ED. EKG neg for acute MI, elevated troponin, CXR with pulm edema. Pt transferred to [facility] for further management.   SIGNIFICANT EVENTS / STUDIES:  7/09 - Presented to [facility] ED w/ SOB 7/09 - EKG >>> no acute MI, troponin of 1.46 7/09 - Tx to [facility]  LINES / TUBES: PIV  CULTURES: 7/9 BC x 2 >>> 7/9 UC >>> 7/9 MRSA by PCR >>>  ANTIBIOTICS: Azithromycin 7/9 >>> Ceftriaxone 7/9 >>>  HISTORY OF PRESENT ILLNESS:  Patient is a 68 year old male, current smoker (120 pack years) with PMH of COPD, alcohol abuse, chronic bronchitis, TIA, HTN, LBBB who presented to [facility] ED via EMS 7/9 for progressively worsening SOB and respiratory distress. Per EMS, sats in 70s on arrival to pt's home. Per EMS, patient began feeling short of breath 7/9 around 3:00am and SOB progressively worsened, wife notes extreme SOB while sitting in recliner around 8:00am.  Wife notes that patient has frequent "flare-ups" of bronchitis, also notes that patient currently smokes 2 packs per day and drinks a case of beer every 2 days.  Recently some question of "spots on the lungs" per wife.  No recent illness to wife's knowledge. Does not use O2 at home. Patient was started on CPAP and transitioned to BiPAP in ED. EKG neg for acute MI, elevated troponin, CXR with pulm edema. Pt transferred to [facility] for further management.  Of note, patient had  recent stenting of bilateral iliacs 05/04/2014 for claudication associated with PVD.   PAST MEDICAL HISTORY :  Past Medical History  Diagnosis Date  . Cerebrovascular disease 2009    TIA; 2009- right ICA stent; re-intervention for restenosis complicated by [facility] w/o sx  . Degenerative joint disease     Total shoulder arthroplasty-right  . LBBB (left bundle branch block)     Normal echo-2011; stress nuclear in 09/2010--septal hypoperfusion representing nontransmural infarction or the effect of left bundle branch block, no ischemia  . Hyperlipidemia   . Hypertension   . Chronic obstructive pulmonary disease   . Tobacco abuse     -100 pack years; 1.5 packs per day  . Anxiety and depression   . Alcohol abuse     6 beers per day; hospital admission in 2009 for withdrawal symptoms  . Thrombocytopenia   . Tubular adenoma of colon    Past Surgical History  Procedure Laterality Date  . Lumbar fusion  2010  . Vasectomy  1971  . Colonoscopy w/ polypectomy  2011  . Total shoulder arthroplasty  2011    Right-Dr. Tamera Punt  . Colonoscopy  08/22/09    Fields-(Tubular Adenoma)3-mm transverse polyp/4-mm polyp otherwise noraml/small internal hemorrhoids  . Esophagogastroduodenoscopy [name] 03/05/2014    Procedure: ESOPHAGOGASTRODUODENOSCOPY (EGD);  Surgeon: Danie Binder, MD;  [facility];  Service: Endoscopy;  Laterality: [name];  11:45  . Agile capsule [name] 03/18/2014    Procedure: AGILE CAPSULE;  Surgeon: Carlyon Prows  Rexene Edison, MD;  [facility];  Service: Endoscopy;  Laterality: [name];  7:30  . Givens capsule study [name] 03/30/2014    Procedure: GIVENS CAPSULE STUDY;  Surgeon: Danie Binder, MD;  [facility];  Service: Endoscopy;  Laterality: [name];  7:30  . Colonoscopy [name] 04/14/2014    Procedure: COLONOSCOPY;  Surgeon: Danie Binder, MD;  [facility];  Service: Endoscopy;  Laterality: [name];  9:30   Prior to Admission medications   Medication Sig Start Date End Date  Taking? Authorizing Provider  amLODipine-benazepril (LOTREL) 5-20 MG per capsule Take 1 capsule by mouth 2 (two) times daily.    Yes Historical Provider, MD  aspirin EC 81 MG tablet Take 81 mg by mouth every evening.    Yes Historical Provider, MD  atorvastatin (LIPITOR) 20 MG tablet Take 20 mg by mouth daily.    Yes Historical Provider, MD  cloNIDine (CATAPRES - DOSED IN MG/24 HR) 0.1 mg/24hr patch Place 0.1 mg onto the skin once a week. Monday   Yes Historical Provider, MD  clopidogrel (PLAVIX) 75 MG tablet Take 75 mg by mouth every evening.    Yes Historical Provider, MD  Coenzyme Q10 (CO Q 10) 100 MG CAPS Take 100 mg by mouth every evening.    Yes Historical Provider, MD  folic acid (FOLVITE) 062 MCG tablet Take 800 mcg by mouth daily.   Yes Historical Provider, MD  [name] Oil 300 MG CAPS Take 300 mg by mouth every evening.    Yes Historical Provider, MD  loratadine (CLARITIN) 10 MG tablet Take 10 mg by mouth.    Yes Historical Provider, MD  LORazepam (ATIVAN) 1 MG tablet Take 1 mg by mouth 3 (three) times daily.    Yes Historical Provider, MD  Multiple Vitamins-Minerals (CENTRUM SILVER PO) Take 0.5 tablets by mouth 2 (two) times daily.    Yes Historical Provider, MD  pantoprazole (PROTONIX) 40 MG tablet Take 40 mg by mouth 2 (two) times daily.   Yes Historical Provider, MD  PARoxetine (PAXIL) 20 MG tablet Take 20 mg by mouth daily.   Yes Historical Provider, MD  Prenatal MV-Min-Fe Fum-FA-DHA (PRENATAL 1 PO) Take 1 tablet by mouth daily.   Yes Historical Provider, MD  pseudoephedrine-guaifenesin (MUCINEX D) 60-600 MG per tablet Take 1 tablet by mouth every 12 (twelve) hours as needed for congestion.   Yes Historical Provider, MD  pyridOXINE (VITAMIN B-6) 100 MG tablet Take 100 mg by mouth daily.   Yes Historical Provider, MD  Thiamine HCl (VITAMIN B-1) 250 MG tablet Take 125 mg by mouth daily.    Yes Historical Provider, MD  vitamin C (ASCORBIC ACID) 500 MG tablet Take 500 mg by mouth daily.      Yes Historical Provider, MD   No Known Allergies  FAMILY HISTORY:  Family History  Problem Relation Age of Onset  . Prostate cancer Brother   . Coronary artery disease Mother   . Diabetes Mother   . Heart disease Mother   . Hypertension Mother   . Heart attack Mother   . Alzheimer's disease Sister   . Alzheimer's disease Father   . Diabetes Father   . Colon cancer Neg Hx    SOCIAL HISTORY:  reports that he has been smoking Cigarettes.  He has a 60 pack-year smoking history. He has never used smokeless tobacco. He reports that he drinks about 36 ounces of alcohol per week. He reports that he does not use illicit drugs.  REVIEW OF SYSTEMS:  Unable to obtain at this time as patient is on BiPAP.  SUBJECTIVE: Per [facility], patient is improved since being placed on BiPAP.  IV Lasix given without any urine output at this time.  VITAL SIGNS: Temp:  [97.5 F (36.4 C)] 97.5 F (36.4 C) (07/09 1020) Pulse Rate:  [91-108] 98 (07/09 1230) Resp:  [25-32] 25 (07/09 1230) BP: (112-132)/(62-72) 132/62 mmHg (07/09 1237) SpO2:  [83 %-99 %] 99 % (07/09 1230) FiO2 (%):  [40 %] 40 % (07/09 1015)  HEMODYNAMICS:   VENTILATOR SETTINGS: Vent Mode:  [-] BIPAP FiO2 (%):  [40 %] 40 %  INTAKE / OUTPUT: Intake/Output   None    PHYSICAL EXAMINATION: General:  Elderly white male, laying in bed, mild distress, on BiPAP Neuro:  Awake, alert, oriented x 4.  Answering questions appropriately.  No speech abnormalities. Moves all extremities. HEENT:  PERRL, oral mucosa pink and dry, BiPAP mask in place. Cardiovascular: S1 S2, RRR, no m/g/r.  Clubbing noted to first digit of bilateral hands. Cap refill > 3 secs. Lungs: No wheezes, rales, or rhonchi ausculated.  On BiPAP. Abdomen:  Soft, non-tender, non-distended. BS + Musculoskeletal:  No acute deformities. Surgical scar R shoulder. Skin:  Cool, dry, intact.  LABS:  CBC  Recent Labs Lab 06/03/14 1011  WBC 14.6*  HGB 12.4*  HCT 35.2*  PLT 196    Coag's No results found for this basename: APTT, INR,  in the last 168 hours BMET  Recent Labs Lab 06/03/14 1011  NA 126*  K 3.8  CL 88*  CO2 18*  BUN 5*  CREATININE 0.57  GLUCOSE 181*   Electrolytes  Recent Labs Lab 06/03/14 1011  CALCIUM 8.6   Sepsis Markers No results found for this basename: LATICACIDVEN, PROCALCITON, O2SATVEN,  in the last 168 hours ABG  Recent Labs Lab 06/03/14 1005  PHART 7.226*  PCO2ART 40.3  PO2ART 89.4   Liver Enzymes  Recent Labs Lab 06/03/14 1011  AST 47*  ALT 29  ALKPHOS 131*  BILITOT 0.3  ALBUMIN 3.7   Cardiac Enzymes  Recent Labs Lab 06/03/14 1011  TROPONINI 1.46*  PROBNP 5223.0*   Glucose No results found for this basename: GLUCAP,  in the last 168 hours  Imaging Dg Chest Portable 1 View  06/03/2014   CLINICAL DATA:  Respiratory distress.  EXAM: PORTABLE CHEST - 1 VIEW  COMPARISON:  Chest x-ray 09/25/2010 .  FINDINGS: Cardiomegaly with pulmonary vascular prominence and interstitial prominence present consistent we congestive heart failure and pulmonary edema. No focal alveolar infiltrate. No pleural effusion or pneumothorax. Right shoulder replacement.  IMPRESSION: Congestive heart failure with pulmonary interstitial edema.   Electronically Signed   By: Marcello Moores  Register   On: 06/03/2014 10:09   CXR: 7/9 CHF with pulm interstitial edema  ASSESSMENT / PLAN:  PULMONARY A: AECOPD vs. Cardiac event vs Occult PNA Pulm edema Hx COPD Current smoker, 120 pack-years P: - Supplementary O2 for sats > 92% - Low threshold to intubate - Solumedrol 40mg  Q8 - Albuterol/Atrovent scheduled - Empiric abx - Rpt ABG noted with pH of 7.395 and hypoxemia. - Rpt CXR 7/10 - Consider rpt CTA of chest for evaluation of nodules March 2015 once more stable. - No BiPAP given positive troponins, if decompensates then will intubate.  CARDIOVASCULAR A: Elevated Troponin-  7/9 1.46 CHF HTN HLD Hx of PVD - bil iliac stents  05/04/14 Hx of LBBB, normal echo 2011 P:  - Serial cardiac enzymes - EKG at  [facility] negative for acute MI - Repeat EKG STAT - Hold home meds - Cards consult called.  RENAL A: AG Metabolic Acidosis Hyponatremia P:  - KVO IVF - Follow BMets - Lasix 40mg  Q12 - Tox screen - Serum osmolality (AG metabolic acidosis that is not explained)  GASTROINTESTINAL A: Hx of tubular adenoma of colon P: - NPO for now - PPI  HEMATOLOGIC A: Leukocytosis Hx thrombocytopenia P:  - SQ Heparin for DVT PPx - Monitor platelets - Monitor WBC count  INFECTIOUS A: Leukocytosis, occult PNA vs stress-related, ?CAP P:   - Monitor WBC count, fever curve - Check lactic acid - Empiric abx  ENDOCRINE A: No acute issues  P:   - SSI  NEUROLOGIC A: Alcohol Abuse - hx of hospitalization for w/d in 2009 Hx Anxiety, Depression Hx TIA, 2009 P:  - Ativan - Thiamine/folate - Holding home paxil  Lowella Dell. Reese 06/03/2014, 1:32 PM  Respiratory failure on BiPAP likely due to PNA, CT with two pulmonary nodule that will be worked up once more stable, acidosis seems to be improving.  Will admit to the ICU for PNA, start CAP coverage, lasix as ordered, if patient deteriorates from a respiratory standpoint then will intubate, no BiPAP given positive troponins.  CC time 35 min.  Patient seen and examined, agree with above note.  I dictated the care and orders written for this patient under my direction.  Rush Farmer, MD [phone]

## 2014-06-03 NOTE — ED Notes (Addendum)
When ems arrived pt in respiratory distress.  sats 78% ra at home.  Pt states he has been sob since 3 am.  Per ems diminished breath sounds right side. ems gave one breathing treatment. Pt has copd .

## 2014-06-04 ENCOUNTER — Other Ambulatory Visit: Payer: Self-pay

## 2014-06-04 DIAGNOSIS — I5033 Acute on chronic diastolic (congestive) heart failure: Secondary | ICD-10-CM

## 2014-06-04 DIAGNOSIS — I517 Cardiomegaly: Secondary | ICD-10-CM

## 2014-06-04 DIAGNOSIS — F10239 Alcohol dependence with withdrawal, unspecified: Secondary | ICD-10-CM

## 2014-06-04 DIAGNOSIS — I739 Peripheral vascular disease, unspecified: Secondary | ICD-10-CM

## 2014-06-04 DIAGNOSIS — F10939 Alcohol use, unspecified with withdrawal, unspecified: Secondary | ICD-10-CM

## 2014-06-04 DIAGNOSIS — Z48812 Encounter for surgical aftercare following surgery on the circulatory system: Secondary | ICD-10-CM

## 2014-06-04 LAB — BASIC METABOLIC PANEL
Anion gap: 18 — ABNORMAL HIGH (ref 5–15)
BUN: 9 mg/dL (ref 6–23)
CALCIUM: 8.8 mg/dL (ref 8.4–10.5)
CO2: 26 mEq/L (ref 19–32)
Chloride: 86 mEq/L — ABNORMAL LOW (ref 96–112)
Creatinine, Ser: 0.5 mg/dL (ref 0.50–1.35)
GFR calc non Af Amer: >90 mL/min (ref >90)
Glucose, Bld: 163 mg/dL — ABNORMAL HIGH (ref 70–99)
POTASSIUM: 4.5 meq/L (ref 3.7–5.3)
SODIUM: 130 meq/L — AB (ref 137–147)

## 2014-06-04 LAB — CBC
HCT: 35.1 % — ABNORMAL LOW (ref 39.0–52.0)
Hemoglobin: 12.1 g/dL — ABNORMAL LOW (ref 13.0–17.0)
MCH: 28.8 pg (ref 26.0–34.0)
MCHC: 34.5 g/dL (ref 30.0–36.0)
MCV: 83.6 fL (ref 78.0–100.0)
Platelets: 199 10*3/uL (ref 150–400)
RBC: 4.2 MIL/uL — ABNORMAL LOW (ref 4.22–5.81)
RDW: 14.2 % (ref 11.5–15.5)
WBC: 12.7 10*3/uL — ABNORMAL HIGH (ref 4.0–10.5)

## 2014-06-04 LAB — BLOOD GAS, ARTERIAL
Acid-Base Excess: 2.8 mmol/L — ABNORMAL HIGH (ref 0.0–2.0)
BICARBONATE: 26.2 meq/L — AB (ref 20.0–24.0)
DRAWN BY: 41308
FIO2: 1 %
O2 SAT: 96.7 %
PATIENT TEMPERATURE: 98.6
PO2 ART: 89.3 mmHg (ref 80.0–100.0)
TCO2: 27.3 mmol/L (ref 0–100)
pCO2 arterial: 36 mmHg (ref 35.0–45.0)
pH, Arterial: 7.475 — ABNORMAL HIGH (ref 7.350–7.450)

## 2014-06-04 LAB — HEPARIN LEVEL (UNFRACTIONATED)
HEPARIN UNFRACTIONATED: 0.25 [IU]/mL — AB (ref 0.30–0.70)
HEPARIN UNFRACTIONATED: 0.21 [IU]/mL — AB (ref 0.30–0.70)
Heparin Unfractionated: 0.31 IU/mL (ref 0.30–0.70)
Heparin Unfractionated: 0.68 IU/mL (ref 0.30–0.70)

## 2014-06-04 LAB — PROCALCITONIN: Procalcitonin: 0.39 ng/mL

## 2014-06-04 LAB — LEGIONELLA ANTIGEN, URINE: LEGIONELLA ANTIGEN, URINE: NEGATIVE

## 2014-06-04 LAB — URINE CULTURE
COLONY COUNT: NO GROWTH
CULTURE: NO GROWTH

## 2014-06-04 LAB — TROPONIN I: Troponin I: 4.07 ng/mL (ref <0.30)

## 2014-06-04 MED ORDER — ATORVASTATIN CALCIUM 80 MG PO TABS
80.0000 mg | ORAL_TABLET | Freq: Every day | ORAL | Status: DC
  Administered 2014-06-04 – 2014-06-21 (×18): 80 mg via ORAL
  Filled 2014-06-04 (×19): qty 1

## 2014-06-04 MED ORDER — PERFLUTREN LIPID MICROSPHERE
INTRAVENOUS | Status: AC
  Administered 2014-06-04: 3 mL via INTRAVENOUS
  Filled 2014-06-04: qty 10

## 2014-06-04 MED ORDER — DEXMEDETOMIDINE HCL IN NACL 200 MCG/50ML IV SOLN
0.2000 ug/kg/h | INTRAVENOUS | Status: AC
  Administered 2014-06-04: 0.2 ug/kg/h via INTRAVENOUS
  Administered 2014-06-04: 0.5 ug/kg/h via INTRAVENOUS
  Administered 2014-06-05 (×2): 0.4 ug/kg/h via INTRAVENOUS
  Filled 2014-06-04 (×4): qty 50

## 2014-06-04 MED ORDER — FENTANYL CITRATE 0.05 MG/ML IJ SOLN
25.0000 ug | INTRAMUSCULAR | Status: DC | PRN
  Administered 2014-06-05 – 2014-06-11 (×9): 25 ug via INTRAVENOUS
  Filled 2014-06-04 (×9): qty 2

## 2014-06-04 MED ORDER — PERFLUTREN LIPID MICROSPHERE
1.0000 mL | INTRAVENOUS | Status: AC | PRN
  Administered 2014-06-04: 3 mL via INTRAVENOUS
  Filled 2014-06-04: qty 10

## 2014-06-04 MED ORDER — MIDAZOLAM HCL 2 MG/2ML IJ SOLN
1.0000 mg | INTRAMUSCULAR | Status: DC | PRN
  Administered 2014-06-04 – 2014-06-05 (×2): 2 mg via INTRAVENOUS
  Filled 2014-06-04 (×2): qty 2

## 2014-06-04 MED ORDER — METOPROLOL TARTRATE 12.5 MG HALF TABLET
12.5000 mg | ORAL_TABLET | Freq: Two times a day (BID) | ORAL | Status: DC
  Administered 2014-06-04 – 2014-06-08 (×10): 12.5 mg via ORAL
  Filled 2014-06-04 (×12): qty 1

## 2014-06-04 MED ORDER — METHYLPREDNISOLONE SODIUM SUCC 40 MG IJ SOLR
40.0000 mg | Freq: Two times a day (BID) | INTRAMUSCULAR | Status: AC
  Administered 2014-06-05 (×2): 40 mg via INTRAVENOUS
  Filled 2014-06-04 (×3): qty 1

## 2014-06-04 MED ORDER — FUROSEMIDE 10 MG/ML IJ SOLN
40.0000 mg | Freq: Two times a day (BID) | INTRAMUSCULAR | Status: DC
  Administered 2014-06-04 – 2014-06-05 (×3): 40 mg via INTRAVENOUS
  Filled 2014-06-04 (×4): qty 4

## 2014-06-04 NOTE — Progress Notes (Addendum)
ANTICOAGULATION CONSULT NOTE - Follow Up Consult  Pharmacy Consult for Heparin  Indication: chest pain/ACS  No Known Allergies  Patient Measurements: Height: 5\' 6"  (167.6 cm) Weight: 153 lb 14.1 oz (69.8 kg) IBW/kg (Calculated) : 63.8  Vital Signs: Temp: 98 F (36.7 C) (07/10 0014) Temp src: Oral (07/10 0014) BP: 115/67 mmHg (07/09 1900) Pulse Rate: 104 (07/09 1900)  Labs:  Recent Labs  06/03/14 1011 06/03/14 1500 06/03/14 2152 06/03/14 2353  HGB 12.4* 11.7*  --   --   HCT 35.2* 34.3*  --   --   PLT 196 170  --   --   APTT  --  33  --   --   LABPROT  --  14.4  --   --   INR  --  1.12  --   --   HEPARINUNFRC  --   --   --  0.31  CREATININE 0.57 0.51  --   --   TROPONINI 1.46* 2.59* 4.15*  --     Estimated Creatinine Clearance: 80.9 ml/min (by C-G formula based on Cr of 0.51).   Medications:  Heparin 950 units/hr  Assessment: 68 y/o M on heparin for +troponin. HL is 0.31. Other labs as above.   Goal of Therapy:  Heparin level 0.3-0.7 units/ml Monitor platelets by anticoagulation protocol: Yes   Plan:  -Continue heparin at 950 units/hr -AM HL -Daily CBC/HL -Monitor for bleeding  Brin Ruggerio 06/04/2014,12:21 AM  ------------------------------------------- Addendum 4:29 AM Repeat HL this AM is 0.25 -Increase heparin drip to 1100 units/hr -1200 HL JLedford, PharmD

## 2014-06-04 NOTE — Progress Notes (Addendum)
ANTICOAGULATION CONSULT NOTE - Follow Up Consult  Pharmacy Consult for heparin Indication: NSTEMI  No Known Allergies  Patient Measurements: Height: 5\' 6"  (167.6 cm) Weight: 151 lb 0.2 oz (68.5 kg) IBW/kg (Calculated) : 63.8 Heparin Dosing Weight: 68.5 kg  Vital Signs: Temp: 97.8 F (36.6 C) (07/10 1939) Temp src: Axillary (07/10 1939) BP: 102/49 mmHg (07/10 1900) Pulse Rate: 80 (07/10 1900)  Labs:  Recent Labs  06/03/14 1011 06/03/14 1500 06/03/14 2152 06/03/14 2353 06/04/14 0322 06/04/14 1241  HGB 12.4* 11.7*  --   --  12.1*  --   HCT 35.2* 34.3*  --   --  35.1*  --   PLT 196 170  --   --  199  --   APTT  --  33  --   --   --   --   LABPROT  --  14.4  --   --   --   --   INR  --  1.12  --   --   --   --   HEPARINUNFRC  --   --   --  0.31 0.25* 0.21*  CREATININE 0.57 0.51  --   --  0.50  --   TROPONINI 1.46* 2.59* 4.15*  --  4.07*  --     Estimated Creatinine Clearance: 80.9 ml/min (by C-G formula based on Cr of 0.5).   Medications:  Scheduled:  . antiseptic oral rinse  15 mL Mouth Rinse BID  . aspirin  81 mg Oral Daily  . atorvastatin  80 mg Oral q1800  . azithromycin  500 mg Intravenous Q24H  . cefTRIAXone (ROCEPHIN)  IV  1 g Intravenous Q24H  . folic acid  1 mg Intravenous Daily  . furosemide  40 mg Intravenous BID  . ipratropium-albuterol  3 mL Nebulization Q4H  . [START ON 06/05/2014] methylPREDNISolone (SOLU-MEDROL) injection  40 mg Intravenous Q12H  . metoprolol tartrate  12.5 mg Oral BID  . pantoprazole (PROTONIX) IV  40 mg Intravenous QHS  . thiamine IV  100 mg Intravenous Daily   Infusions:  . sodium chloride Stopped (06/04/14 1900)  . dexmedetomidine 0.5 mcg/kg/hr (06/04/14 2052)  . heparin 1,400 Units/hr (06/04/14 1511)    Assessment: 68 yo male with NSTEMI is currently on therapeutic heparin.  Heparin level is 0.68. Goal of Therapy:  Heparin level 0.3-0.7 units/ml Monitor platelets by anticoagulation protocol: Yes   Plan:  1)  Continue heparin at 1400 units/hr 2) Repeat am heparin level to confirm  Krystie Leiter, [place] 06/04/2014,9:00 PM

## 2014-06-04 NOTE — Progress Notes (Signed)
ANTICOAGULATION CONSULT NOTE - Follow Up Consult  Pharmacy Consult for Heparin Indication: NSTEMI  No Known Allergies  Patient Measurements: Height: 5\' 6"  (167.6 cm) Weight: 151 lb 0.2 oz (68.5 kg) IBW/kg (Calculated) : 63.8 Heparin Dosing Weight: 68.5 kg  Vital Signs: Temp: 97.7 F (36.5 C) (07/10 1159) Temp src: Oral (07/10 1159) BP: 116/63 mmHg (07/10 1400) Pulse Rate: 87 (07/10 1300)  Labs:  Recent Labs  06/03/14 1011 06/03/14 1500 06/03/14 2152 06/03/14 2353 06/04/14 0322 06/04/14 1241  HGB 12.4* 11.7*  --   --  12.1*  --   HCT 35.2* 34.3*  --   --  35.1*  --   PLT 196 170  --   --  199  --   APTT  --  33  --   --   --   --   LABPROT  --  14.4  --   --   --   --   INR  --  1.12  --   --   --   --   HEPARINUNFRC  --   --   --  0.31 0.25* 0.21*  CREATININE 0.57 0.51  --   --  0.50  --   TROPONINI 1.46* 2.59* 4.15*  --  4.07*  --     Estimated Creatinine Clearance: 80.9 ml/min (by C-G formula based on Cr of 0.5).  Assessment:   Heparin level this afternoon is subtherapeutic (0.21) on 1100 units/hr.  Level dropped after increase rate early this morning; prior level (0.25) likely still reflected some bolus effect.  No interruptions of infusion.  Possible cardiac cath on Monday (7/13).  Goal of Therapy:  Heparin level 0.3-0.7 units/ml Monitor platelets by anticoagulation protocol: Yes   Plan:   Increase heparin drip to 1400 units/hr.  Next heparin level in ~ 6 hrs.  Continue daily heparin level and CBC.  Arty Baumgartner, [place] Pager: [phone] 06/04/2014,3:13 PM

## 2014-06-04 NOTE — Progress Notes (Signed)
PULMONARY / CRITICAL CARE MEDICINE   Name: Roy [name] MRN: [id number] DOB: February 06, 1946    ADMISSION DATE:  06/03/2014 CONSULTATION DATE:  06/03/2014  REFERRING MD :  Lacinda Axon ([facility]) PRIMARY SERVICE: PCCM  CHIEF COMPLAINT:  SOB, Respiratory Distress  BRIEF PATIENT DESCRIPTION: Patient is a 68 year old male, current smoker (120 pack years) with PMH of COPD, alcohol abuse, chronic bronchitis, TIA, HTN, LBBB who presented to [facility] ED via EMS 7/9 for progressively worsening SOB and respiratory distress. Per EMS, sats in 70s on arrival to pt's home. Patient was started on CPAP and transitioned to BiPAP in ED. Concern ischemia, MI  SIGNIFICANT EVENTS / STUDIES:  7/09 - Presented to [facility] ED w/ SOB 7/09 - EKG >>> no acute MI, troponin of 1.46 7/09 - Tx to [facility] 7/10- requires high flow O2, ETOH WD concern  LINES / TUBES: PIV  CULTURES: 7/9 BC x 2 >>> 7/9 UC >>> 7/9 MRSA by PCR >>>  ANTIBIOTICS: Azithromycin 7/9 >>> Ceftriaxone 7/9 >>>  SUBJECTIVE: high O2 needed, neg 1.2 liters, agitation  VITAL SIGNS: Temp:  [97.4 F (36.3 C)-98.8 F (37.1 C)] 97.7 F (36.5 C) (07/10 1159) Pulse Rate:  [82-109] 87 (07/10 1300) Resp:  [14-36] 25 (07/10 1400) BP: (112-132)/(62-87) 116/63 mmHg (07/10 1400) SpO2:  [85 %-100 %] 90 % (07/10 1300) FiO2 (%):  [40 %-100 %] 100 % (07/10 1200) Weight:  [68.5 kg (151 lb 0.2 oz)] 68.5 kg (151 lb 0.2 oz) (07/10 0355)  HEMODYNAMICS:   VENTILATOR SETTINGS: Vent Mode:  [-]  FiO2 (%):  [40 %-100 %] 100 %  INTAKE / OUTPUT: Intake/Output     07/09 0701 - 07/10 0700 07/10 0701 - 07/11 0700   P.O. 120 600   I.V. (mL/kg) 227.5 (3.3) 97 (1.4)   IV Piggyback 300 250   Total Intake(mL/kg) 647.5 (9.5) 947 (13.8)   Urine (mL/kg/hr) 1885 740 (1.4)   Total Output 1885 740   Net -1237.5 +207        Urine Occurrence 1 x     PHYSICAL EXAMINATION: General: anxious Neuro: moves all ext equal, follows commands HEENT: jvd PULM: crackles CV: s1 s2 rrr no r GI: abdo  soft, bs hypo, noo r/g Extremities: no edema   LABS:  CBC  Recent Labs Lab 06/03/14 1011 06/03/14 1500 06/04/14 0322  WBC 14.6* 12.0* 12.7*  HGB 12.4* 11.7* 12.1*  HCT 35.2* 34.3* 35.1*  PLT 196 170 199   Coag's  Recent Labs Lab 06/03/14 1500  APTT 33  INR 1.12   BMET  Recent Labs Lab 06/03/14 1011 06/03/14 1500 06/04/14 0322  NA 126* 126* 130*  K 3.8 4.2 4.5  CL 88* 87* 86*  CO2 18* 19 26  BUN 5* 7 9  CREATININE 0.57 0.51 0.50  GLUCOSE 181* 182* 163*   Electrolytes  Recent Labs Lab 06/03/14 1011 06/03/14 1500 06/04/14 0322  CALCIUM 8.6 8.6 8.8  MG  --  1.7  --   PHOS  --  4.1  --    Sepsis Markers  Recent Labs Lab 06/03/14 1500 06/04/14 0322  LATICACIDVEN 3.9*  --   PROCALCITON 0.25 0.39   ABG  Recent Labs Lab 06/03/14 1005 06/03/14 1440 06/04/14 0409  PHART 7.226* 7.395 7.475*  PCO2ART 40.3 36.0 36.0  PO2ART 89.4 53.0* 89.3   Liver Enzymes  Recent Labs Lab 06/03/14 1011 06/03/14 1500  AST 47* 66*  ALT 29 27  ALKPHOS 131* 117  BILITOT 0.3 0.4  ALBUMIN 3.7 3.6  Cardiac Enzymes  Recent Labs Lab 06/03/14 1011 06/03/14 1500 06/03/14 2152 06/04/14 0322  TROPONINI 1.46* 2.59* 4.15* 4.07*  PROBNP 5223.0*  --   --   --    Glucose No results found for this basename: GLUCAP,  in the last 168 hours  Imaging Dg Chest Portable 1 View  06/03/2014   CLINICAL DATA:  Respiratory distress.  EXAM: PORTABLE CHEST - 1 VIEW  COMPARISON:  Chest x-ray 09/25/2010 .  FINDINGS: Cardiomegaly with pulmonary vascular prominence and interstitial prominence present consistent we congestive heart failure and pulmonary edema. No focal alveolar infiltrate. No pleural effusion or pneumothorax. Right shoulder replacement.  IMPRESSION: Congestive heart failure with pulmonary interstitial edema.   Electronically Signed   By: Marcello Moores  Register   On: 06/03/2014 10:09   CXR: 7/9 CHF with pulm interstitial edema  ASSESSMENT / PLAN:  PULMONARY A: AECOPD  vs. Cardiac event vs Occult PNA Pulm edema main issue Hx COPD Current smoker, 120 pack-years P: - Supplementary O2 for sats > 92% - Low threshold to intubate, especially if mental status worsens -neg balance goals, did well last 24 hrs - Solumedrol 42m Q8 maintain, consider to q12h - Albuterol/Atrovent scheduled - Empiric abx - Consider rpt CTA of chest for evaluation of nodules March 2015 once more stable.  CARDIOVASCULAR A: Elevated Troponin-  7/9 1.46 CHF HTN HLD Hx of PVD - bil iliac stents 05/04/14 Hx of LBBB, normal echo 2011 P: - asa, hep drip -cath monday likely - Cards consult called.  RENAL A: AG Metabolic Acidosis resolved, lactic Met alk Hyponatremia, overload improved P:  - KVO IVF - Follow BMets - Lasix per cards, did well with current dosage - Tox screen neg  GASTROINTESTINAL A: Hx of tubular adenoma of colon P: - start diet, resp status improved to some degree - PPI  HEMATOLOGIC A: Leukocytosis Hx thrombocytopenia P:  - hep drip, cbc in am   INFECTIOUS A: R/o PNA, not impressed for this P:   - ABX, consider dc in am or narrow to monotherapy atypical coverage -pcxr in am   ENDOCRINE A: No acute issues  P:   - cbg  NEUROLOGIC A: Alcohol Abuse - hx of hospitalization for w/d in 2009 Hx Anxiety, Depression Hx TIA, 2009 P:  - Ativan, may need to schedule this or consider precedex if worsen - Thiamine/folate    CC time 3o min.  DLavon Paganini FTitus Mould MD, FAlsenPgr: 3[facility]

## 2014-06-04 NOTE — Progress Notes (Signed)
Echocardiogram 2D Echocardiogram with Definity has been performed.  Roy [name] 06/04/2014, 12:50 PM

## 2014-06-04 NOTE — Progress Notes (Signed)
    Subjective:  Denies CP or dyspnea   Objective:  Filed Vitals:   06/04/14 0400 06/04/14 0700 06/04/14 0800 06/04/14 0829  BP:  130/87 130/67 130/67  Pulse:  82 86 87  Temp: 98.8 F (37.1 C)   98.7 F (37.1 C)  TempSrc: Oral   Oral  Resp:  29 25 18   Height:      Weight:      SpO2: 100% 99% 91% 90%    Intake/Output from previous day:  Intake/Output Summary (Last 24 hours) at 06/04/14 8341 Last data filed at 06/04/14 0800  Gross per 24 hour  Intake  658.5 ml  Output   2085 ml  Net -1426.5 ml    Physical Exam: Physical exam: Well-developed well-nourished in no acute distress.  Skin is warm and dry.  HEENT is normal.  Neck is supple.  Chest basilar crackles and diminished BS Cardiovascular exam distant heart sounds; regular rate and rhythm.  Abdominal exam nontender or distended. No masses palpated. Extremities show no edema. neuro grossly intact    Lab Results: Basic Metabolic Panel:  Recent Labs  06/03/14 1011 06/03/14 1500 06/04/14 0322  NA 126* 126* 130*  K 3.8 4.2 4.5  CL 88* 87* 86*  CO2 18* 19 26  GLUCOSE 181* 182* 163*  BUN 5* 7 9  CREATININE 0.57 0.51 0.50  CALCIUM 8.6 8.6 8.8  MG  --  1.7  --   PHOS  --  4.1  --    CBC:  Recent Labs  06/03/14 1011 06/03/14 1500 06/04/14 0322  WBC 14.6* 12.0* 12.7*  NEUTROABS 12.9*  --   --   HGB 12.4* 11.7* 12.1*  HCT 35.2* 34.3* 35.1*  MCV 84.6 84.9 83.6  PLT 196 170 199   Cardiac Enzymes:  Recent Labs  06/03/14 1500 06/03/14 2152 06/04/14 0322  TROPONINI 2.59* 4.15* 4.07*     Assessment/Plan:  1 non-ST elevation myocardial infarction-patient has ruled in. Continue aspirin, heparin. Add statin. Add low-dose metoprolol. He continues with pulmonary edema on examination. Continue Lasix. Follow renal function. He will ultimately need cardiac catheterization. Hopefully this can be performed on Monday. The risks and benefits were discussed and he agrees to proceed. Proceed with  echocardiogram to assess LV function. 2 alcohol abuse-the patient drinks a 12 pack daily. I am concerned about the possibility of withdrawal. Need to follow closely. Patient educated on discontinuing. 3 tobacco abuse-patient counseled on discontinuing. 4 hyperlipidemia-resume statin. 5 hypertension-add metoprolol 12.5 mg by mouth twice a day. Follow blood pressure and adjust medications as needed. 6 acute diastolic congestive heart failure-patient presented with pulmonary edema. This was most likely ischemia mediated. Continue diuresis and follow renal function. 7 COPD-management per critical care medicine. 8 history of pulmonary nodules 9 hyponatremia-improving. Kirk Ruths 06/04/2014, 9:09 AM

## 2014-06-05 ENCOUNTER — Inpatient Hospital Stay (HOSPITAL_COMMUNITY): Payer: Medicare Other

## 2014-06-05 DIAGNOSIS — I5021 Acute systolic (congestive) heart failure: Secondary | ICD-10-CM

## 2014-06-05 LAB — PHOSPHORUS: Phosphorus: 4.1 mg/dL (ref 2.3–4.6)

## 2014-06-05 LAB — CBC
HEMATOCRIT: 31.1 % — AB (ref 39.0–52.0)
HEMOGLOBIN: 10.8 g/dL — AB (ref 13.0–17.0)
MCH: 29 pg (ref 26.0–34.0)
MCHC: 34.7 g/dL (ref 30.0–36.0)
MCV: 83.6 fL (ref 78.0–100.0)
Platelets: 167 10*3/uL (ref 150–400)
RBC: 3.72 MIL/uL — ABNORMAL LOW (ref 4.22–5.81)
RDW: 14.3 % (ref 11.5–15.5)
WBC: 11.9 10*3/uL — AB (ref 4.0–10.5)

## 2014-06-05 LAB — BASIC METABOLIC PANEL
Anion gap: 17 — ABNORMAL HIGH (ref 5–15)
BUN: 16 mg/dL (ref 6–23)
CHLORIDE: 87 meq/L — AB (ref 96–112)
CO2: 26 meq/L (ref 19–32)
Calcium: 8.2 mg/dL — ABNORMAL LOW (ref 8.4–10.5)
Creatinine, Ser: 0.52 mg/dL (ref 0.50–1.35)
GFR calc Af Amer: >90 mL/min (ref >90)
GFR calc non Af Amer: >90 mL/min (ref >90)
GLUCOSE: 134 mg/dL — AB (ref 70–99)
POTASSIUM: 3.8 meq/L (ref 3.7–5.3)
Sodium: 130 mEq/L — ABNORMAL LOW (ref 137–147)

## 2014-06-05 LAB — MAGNESIUM: MAGNESIUM: 1.8 mg/dL (ref 1.5–2.5)

## 2014-06-05 LAB — CORTISOL: CORTISOL PLASMA: 55.1 ug/dL

## 2014-06-05 LAB — HEPARIN LEVEL (UNFRACTIONATED): Heparin Unfractionated: 0.65 IU/mL (ref 0.30–0.70)

## 2014-06-05 LAB — PROCALCITONIN: Procalcitonin: 0.24 ng/mL

## 2014-06-05 MED ORDER — LORAZEPAM 2 MG/ML IJ SOLN
INTRAMUSCULAR | Status: AC
  Filled 2014-06-05: qty 1

## 2014-06-05 MED ORDER — CHLORHEXIDINE GLUCONATE 0.12 % MT SOLN
15.0000 mL | Freq: Two times a day (BID) | OROMUCOSAL | Status: DC
  Administered 2014-06-06 – 2014-06-22 (×30): 15 mL via OROMUCOSAL
  Filled 2014-06-05 (×32): qty 15

## 2014-06-05 MED ORDER — FUROSEMIDE 10 MG/ML IJ SOLN
4.0000 mg/h | INTRAVENOUS | Status: DC
  Administered 2014-06-05 – 2014-06-08 (×2): 4 mg/h via INTRAVENOUS
  Filled 2014-06-05 (×3): qty 25

## 2014-06-05 MED ORDER — PANTOPRAZOLE SODIUM 40 MG IV SOLR
40.0000 mg | Freq: Every day | INTRAVENOUS | Status: DC
  Administered 2014-06-06 – 2014-06-07 (×2): 40 mg via INTRAVENOUS
  Filled 2014-06-05 (×3): qty 40

## 2014-06-05 MED ORDER — ROCURONIUM BROMIDE 50 MG/5ML IV SOLN
INTRAVENOUS | Status: AC
  Filled 2014-06-05: qty 2

## 2014-06-05 MED ORDER — PROPOFOL 10 MG/ML IV EMUL
INTRAVENOUS | Status: AC
  Filled 2014-06-05: qty 100

## 2014-06-05 MED ORDER — MIDAZOLAM HCL 2 MG/2ML IJ SOLN
INTRAMUSCULAR | Status: AC
  Filled 2014-06-05: qty 2

## 2014-06-05 MED ORDER — LORAZEPAM 2 MG/ML IJ SOLN
1.0000 mg | INTRAMUSCULAR | Status: DC | PRN
  Administered 2014-06-05 – 2014-06-06 (×4): 2 mg via INTRAVENOUS
  Filled 2014-06-05 (×5): qty 1

## 2014-06-05 MED ORDER — BIOTENE DRY MOUTH MT LIQD
15.0000 mL | Freq: Four times a day (QID) | OROMUCOSAL | Status: DC
  Administered 2014-06-06 – 2014-06-22 (×59): 15 mL via OROMUCOSAL

## 2014-06-05 MED ORDER — FENTANYL CITRATE 0.05 MG/ML IJ SOLN
INTRAMUSCULAR | Status: AC
  Filled 2014-06-05: qty 2

## 2014-06-05 MED ORDER — DEXMEDETOMIDINE HCL IN NACL 200 MCG/50ML IV SOLN
0.4000 ug/kg/h | INTRAVENOUS | Status: DC
Start: 2014-06-05 — End: 2014-06-06
  Administered 2014-06-05: 1.2 ug/kg/h via INTRAVENOUS
  Administered 2014-06-05: 0.2 ug/kg/h via INTRAVENOUS
  Filled 2014-06-05 (×2): qty 50

## 2014-06-05 MED ORDER — SUCCINYLCHOLINE CHLORIDE 20 MG/ML IJ SOLN
INTRAMUSCULAR | Status: DC
Start: 2014-06-05 — End: 2014-06-06
  Filled 2014-06-05: qty 1

## 2014-06-05 MED ORDER — LIDOCAINE HCL (CARDIAC) 20 MG/ML IV SOLN
INTRAVENOUS | Status: AC
  Filled 2014-06-05: qty 5

## 2014-06-05 MED ORDER — ETOMIDATE 2 MG/ML IV SOLN
INTRAVENOUS | Status: AC
  Filled 2014-06-05: qty 20

## 2014-06-05 MED ORDER — LORAZEPAM 2 MG/ML IJ SOLN
1.0000 mg | INTRAMUSCULAR | Status: DC | PRN
  Administered 2014-06-05 – 2014-06-12 (×11): 2 mg via INTRAVENOUS
  Filled 2014-06-05 (×10): qty 1

## 2014-06-05 NOTE — Progress Notes (Signed)
ANTICOAGULATION CONSULT NOTE - Follow Up Consult  Pharmacy Consult for Heparin Indication: NSTEMI  No Known Allergies  Patient Measurements: Height: 5\' 6"  (167.6 cm) Weight: 147 lb 4.3 oz (66.8 kg) IBW/kg (Calculated) : 63.8 Heparin Dosing Weight: 66.8 kg  Vital Signs: Temp: 97.9 F (36.6 C) (07/11 1134) Temp src: Oral (07/11 1134) BP: 110/48 mmHg (07/11 1200) Pulse Rate: 66 (07/11 1200)  Labs:  Recent Labs  06/03/14 1500 06/03/14 2152  06/04/14 0322 06/04/14 1241 06/04/14 2055 06/05/14 0223  HGB 11.7*  --   --  12.1*  --   --  10.8*  HCT 34.3*  --   --  35.1*  --   --  31.1*  PLT 170  --   --  199  --   --  167  APTT 33  --   --   --   --   --   --   LABPROT 14.4  --   --   --   --   --   --   INR 1.12  --   --   --   --   --   --   HEPARINUNFRC  --   --   < > 0.25* 0.21* 0.68 0.65  CREATININE 0.51  --   --  0.50  --   --  0.52  TROPONINI 2.59* 4.15*  --  4.07*  --   --   --   < > = values in this interval not displayed.  Estimated Creatinine Clearance: 80.9 ml/min (by C-G formula based on Cr of 0.52).  Assessment:   Heparin remains therapeutic (0.65) on 1400 units/hr.  Possible cardiac cath on Monday (7/13).  Goal of Therapy:  Heparin level 0.3-0.7 units/ml Monitor platelets by anticoagulation protocol: Yes   Plan:   Continue heparin drip at 1400 units/hr.  Continue daily heparin level and CBC.  Arty Baumgartner, [place] Pager: [phone] 06/05/2014,2:15 PM

## 2014-06-05 NOTE — Progress Notes (Addendum)
CIWA score 29. Dr. Tamala Julian notified. New orders obtained. Will start precedex and continue to monitor.

## 2014-06-05 NOTE — Progress Notes (Signed)
PULMONARY / CRITICAL CARE MEDICINE   Name: Roy [name] MRN: [id number] DOB: [birth year]    ADMISSION DATE:  06/03/2014 CONSULTATION DATE:  06/03/2014  REFERRING MD :  Lacinda Axon ([facility]) PRIMARY SERVICE: PCCM  CHIEF COMPLAINT:  SOB, Respiratory Distress  BRIEF PATIENT DESCRIPTION: Patient is a 68 year old male, current smoker (120 pack years) with PMH of COPD, alcohol abuse, chronic bronchitis, TIA, HTN, LBBB who presented to [facility] ED via EMS 7/9 for progressively worsening SOB and respiratory distress. Per EMS, sats in 70s on arrival to pt's home. Patient was started on CPAP and transitioned to BiPAP in ED. Concern ischemia, MI  SIGNIFICANT EVENTS / STUDIES:  7/09 - Presented to [facility] ED w/ SOB 7/09 - EKG >>> no acute MI, troponin of 1.46 7/09 - Tx to [facility] 7/10- requires high flow O2, ETOH WD concern 7/10 - TTE > LVEF 20%, physiology suggestive of restriction, LAE  LINES / TUBES: PIV  CULTURES: 7/9 BC x 2 >>> 7/9 UC >>> 7/9 MRSA by PCR >>>  ANTIBIOTICS: Azithromycin 7/9 >>> Ceftriaxone 7/9 >>>  SUBJECTIVE: comfortable, agitation improved  VITAL SIGNS: Temp:  [97.7 F (36.5 C)-98.6 F (37 C)] 98.3 F (36.8 C) (07/11 0828) Pulse Rate:  [60-104] 70 (07/11 0900) Resp:  [13-36] 22 (07/11 0900) BP: (86-132)/(38-75) 99/42 mmHg (07/11 0900) SpO2:  [83 %-100 %] 95 % (07/11 0900) FiO2 (%):  [100 %] 100 % (07/11 0400) Weight:  [66.8 kg (147 lb 4.3 oz)] 66.8 kg (147 lb 4.3 oz) (07/11 0700)  HEMODYNAMICS:   VENTILATOR SETTINGS: Vent Mode:  [-]  FiO2 (%):  [100 %] 100 %  INTAKE / OUTPUT: Intake/Output     07/10 0701 - 07/11 0700 07/11 0701 - 07/12 0700   P.O. 840 120   I.V. (mL/kg) 484.7 (7.3) 20.9 (0.3)   IV Piggyback 300    Total Intake(mL/kg) 1624.7 (24.3) 140.9 (2.1)   Urine (mL/kg/hr) 1280 (0.8) 400 (2)   Total Output 1280 400   Net +344.7 -259.1        Stool Occurrence 1 x     PHYSICAL EXAMINATION:  Gen: sitting up, comfortable, no acute distress HEENT: NCAT, EOMi,   PULM: Crackles bases, wheezing L > R CV: RRR, no mgr, JVD AB: BS+, soft, nontender, no hsm Ext: warm, no edema, no clubbing, no cyanosis Derm: no rash or skin breakdown Neuro: Awake and alert, oriented to situation, place etc   LABS:  CBC  Recent Labs Lab 06/03/14 1500 06/04/14 0322 06/05/14 0223  WBC 12.0* 12.7* 11.9*  HGB 11.7* 12.1* 10.8*  HCT 34.3* 35.1* 31.1*  PLT 170 199 167   Coag's  Recent Labs Lab 06/03/14 1500  APTT 33  INR 1.12   BMET  Recent Labs Lab 06/03/14 1500 06/04/14 0322 06/05/14 0223  NA 126* 130* 130*  K 4.2 4.5 3.8  CL 87* 86* 87*  CO2 _0 BUN _1 CREATININE 0.51 0.50 0.52  GLUCOSE 182* 163* 134*   Electrolytes  Recent Labs Lab 06/03/14 1500 06/04/14 0322 06/05/14 0223  CALCIUM 8.6 8.8 8.2*  MG 1.7  --  1.8  PHOS 4.1  --  4.1   Sepsis Markers  Recent Labs Lab 06/03/14 1500 06/04/14 0322 06/05/14 0225  LATICACIDVEN 3.9*  --   --   PROCALCITON 0.25 0.39 0.24   ABG  Recent Labs Lab 06/03/14 1005 06/03/14 1440 06/04/14 0409  PHART 7.226* 7.395 7.475*  PCO2ART 40.3 36.0 36.0  PO2ART 89.4 53.0* 89.3  Liver Enzymes  Recent Labs Lab 06/03/14 1011 06/03/14 1500  AST 47* 66*  ALT 29 27  ALKPHOS 131* 117  BILITOT 0.3 0.4  ALBUMIN 3.7 3.6   Cardiac Enzymes  Recent Labs Lab 06/03/14 1011 06/03/14 1500 06/03/14 2152 06/04/14 0322  TROPONINI 1.46* 2.59* 4.15* 4.07*  PROBNP 5223.0*  --   --   --    Glucose No results found for this basename: GLUCAP,  in the last 168 hours  Imaging  CXR: 7/1 CHF with pulm interstitial edema per radiology increased (stable per my review)  ASSESSMENT / PLAN:  PULMONARY A: Acute hypoxemic respiratory failure due to CHF complicated by COPD Treating empirically as CAP COPD but not clearly in exacerbation Current smoker, 120 pack-years P: - Supplementary O2 for sats > 92% - Low threshold to intubate, especially if mental status worsens - continue lasix -  continue antibiotics for now - wean/stop solumedrol - continue scheduled and prn albuterol  CARDIOVASCULAR A: NSTEMI Acute decompensated systolic heart failure  P: - per cardiology - asa, hep drip - continue lasix > per cardiology - target net negative I/O  RENAL A: AG Metabolic Acidosis resolved, lactic Met alk Hyponatremia, overload improved P:  - KVO IVF - Follow BMets  GASTROINTESTINAL A: Hx of tubular adenoma of colon P: - continue diet - PPI  HEMATOLOGIC A: Leukocytosis Hx thrombocytopenia P:  - cbc daily  INFECTIOUS A: Severe CAP? Possibly but picture seems more consistent with pneumonia P:   - continue antibiotics, but if oxygen needs and CXR improves with diuresis would stop soon - f/u culture  ENDOCRINE A: No acute issues  P:   - cbg  NEUROLOGIC A: Acute encephalopathy due to Alcohol withdrawal > improved - hx of hospitalization for w/d in 2009 Hx Anxiety, Depression Hx TIA, 2009 P:  - Ativan prn, wean precedex off - Thiamine/folate  CC time 35  Brent , MD [place] P[facility]M Pager: [phone] Cell: [phone] If no response, call [phone]  

## 2014-06-05 NOTE — Progress Notes (Signed)
eLink Physician-Brief Progress Note Patient Name: Roy [name] DOB: 1946/07/06 MRN: [id number]  Date of Service  06/05/2014   HPI/Events of Note  Patient was on precedex earlier today for ETOH withdrawal.  Ot was weaned off and patient initially did well.  He is now tachycardic and tachypneic with hallucinations.  Ativan dose did not help.   eICU Interventions  Restart precedex   Intervention Category Minor Interventions: Agitation / anxiety - evaluation and management  Mauri [place], P 06/05/2014, 9:09 PM

## 2014-06-05 NOTE — Progress Notes (Signed)
CIWA score 28. Patient with visual and auditory hallucinations. Patient is pulling off oxygen, and requiring 100% non rebreather with 6LNC bleed in. Patient is requiring 1:1 nurse supervision for safety at this time. Unable to use green safety  mitts because it increased patients agitation. Dr. Jimmy Footman notified. Precedex increased to 1.2 per order. Will continue to monitor.

## 2014-06-05 NOTE — Progress Notes (Signed)
    Subjective:  Denies CP or dyspnea   Objective:  Filed Vitals:   06/05/14 0800 06/05/14 0828 06/05/14 0900 06/05/14 1000  BP: 118/57  99/42 116/49  Pulse: 69  70 66  Temp:  98.3 F (36.8 Roy [name])    TempSrc:  Oral    Resp: 13  22 31   Height:      Weight:      SpO2: 95%  95% 94%    Intake/Output from previous day:  Intake/Output Summary (Last 24 hours) at 06/05/14 1024 Last data filed at 06/05/14 0900  Gross per 24 hour  Intake 1362.57 ml  Output   1385 ml  Net -22.43 ml    Physical Exam: Physical exam: Well-developed well-nourished, mild respiratory distress on [name] and NRB Skin is warm and dry.  HEENT is normal.  Neck is supple.  JVP is elevated to 3 cm above the sternal angle. Chest basilar crackles and diminished BS Cardiovascular exam distant heart sounds; regular rate and rhythm.  Abdominal exam nontender or distended. No masses palpated. Extremities show no edema. neuro grossly intact   Lab Results: Basic Metabolic Panel:  Recent Labs  06/03/14 1500 06/04/14 0322 06/05/14 0223  NA 126* 130* 130*  K 4.2 4.5 3.8  CL 87* 86* 87*  CO2 19 26 26   GLUCOSE 182* 163* 134*  BUN 7 9 16   CREATININE 0.51 0.50 0.52  CALCIUM 8.6 8.8 8.2*  MG 1.7  --  1.8  PHOS 4.1  --  4.1   CBC:  Recent Labs  06/03/14 1011  06/04/14 0322 06/05/14 0223  WBC 14.6*  < > 12.7* 11.9*  NEUTROABS 12.9*  --   --   --   HGB 12.4*  < > 12.1* 10.8*  HCT 35.2*  < > 35.1* 31.1*  MCV 84.6  < > 83.6 83.6  PLT 196  < > 199 167  < > = values in this interval not displayed. Cardiac Enzymes:  Recent Labs  06/03/14 1500 06/03/14 2152 06/04/14 0322  TROPONINI 2.59* 4.15* 4.07*     Assessment/Plan:  1 non-ST elevation myocardial infarction-patient has ruled in. Troponin elevated to 4.15 and trending down. Continue aspirin, heparin, statin and low-dose metoprolol. He will ultimately need cardiac catheterization. Hopefully this can be performed on Monday. The risks and benefits were  discussed and he agrees to proceed. 2 alcohol abuse-the patient drinks a 12 pack daily. I am concerned about the possibility of withdrawal. Need to follow closely. Patient educated on discontinuing. 3 tobacco abuse-patient counseled on discontinuing. 4 hyperlipidemia-on statin. 5 hypertension-add metoprolol 12.5 mg by mouth twice a day. Follow blood pressure and adjust medications as needed. 6 acute diastolic congestive heart failure-patient presented with pulmonary edema. This was most likely ischemia mediated. Continue diuresis and follow renal function. 7 COPD-management per critical care medicine. 8 history of pulmonary nodules 9 hyponatremia-improving. 10. New cardiomyopathy, presumably ischemic - Echo shows LVEF of 20%, with global hypokinesis. Give additonal lasix now and start lasix gtts.  Roy Casino, MD, [facility] Attending Cardiologist [facility]  Roy Roy [name] 06/05/2014, 10:24 AM

## 2014-06-06 ENCOUNTER — Inpatient Hospital Stay (HOSPITAL_COMMUNITY): Payer: Medicare Other

## 2014-06-06 DIAGNOSIS — J9600 Acute respiratory failure, unspecified whether with hypoxia or hypercapnia: Secondary | ICD-10-CM

## 2014-06-06 DIAGNOSIS — I447 Left bundle-branch block, unspecified: Secondary | ICD-10-CM

## 2014-06-06 DIAGNOSIS — I214 Non-ST elevation (NSTEMI) myocardial infarction: Secondary | ICD-10-CM

## 2014-06-06 LAB — CBC WITH DIFFERENTIAL/PLATELET
BASOS PCT: 0 % (ref 0–1)
Basophils Absolute: 0 10*3/uL (ref 0.0–0.1)
Eosinophils Absolute: 0 10*3/uL (ref 0.0–0.7)
Eosinophils Relative: 0 % (ref 0–5)
HCT: 28.8 % — ABNORMAL LOW (ref 39.0–52.0)
HEMOGLOBIN: 9.8 g/dL — AB (ref 13.0–17.0)
LYMPHS ABS: 0.7 10*3/uL (ref 0.7–4.0)
Lymphocytes Relative: 6 % — ABNORMAL LOW (ref 12–46)
MCH: 28.6 pg (ref 26.0–34.0)
MCHC: 34 g/dL (ref 30.0–36.0)
MCV: 84 fL (ref 78.0–100.0)
Monocytes Absolute: 1.1 10*3/uL — ABNORMAL HIGH (ref 0.1–1.0)
Monocytes Relative: 10 % (ref 3–12)
NEUTROS ABS: 9.5 10*3/uL — AB (ref 1.7–7.7)
Neutrophils Relative %: 84 % — ABNORMAL HIGH (ref 43–77)
Platelets: 168 10*3/uL (ref 150–400)
RBC: 3.43 MIL/uL — AB (ref 4.22–5.81)
RDW: 14.5 % (ref 11.5–15.5)
WBC: 11.3 10*3/uL — ABNORMAL HIGH (ref 4.0–10.5)

## 2014-06-06 LAB — BASIC METABOLIC PANEL
ANION GAP: 19 — AB (ref 5–15)
BUN: 21 mg/dL (ref 6–23)
CHLORIDE: 87 meq/L — AB (ref 96–112)
CO2: 25 mEq/L (ref 19–32)
Calcium: 8 mg/dL — ABNORMAL LOW (ref 8.4–10.5)
Creatinine, Ser: 0.59 mg/dL (ref 0.50–1.35)
GFR calc non Af Amer: >90 mL/min (ref >90)
Glucose, Bld: 135 mg/dL — ABNORMAL HIGH (ref 70–99)
Potassium: 3.1 mEq/L — ABNORMAL LOW (ref 3.7–5.3)
Sodium: 131 mEq/L — ABNORMAL LOW (ref 137–147)

## 2014-06-06 LAB — GLUCOSE, CAPILLARY
GLUCOSE-CAPILLARY: 120 mg/dL — AB (ref 70–99)
GLUCOSE-CAPILLARY: 139 mg/dL — AB (ref 70–99)
Glucose-Capillary: 142 mg/dL — ABNORMAL HIGH (ref 70–99)
Glucose-Capillary: 135 mg/dL — ABNORMAL HIGH (ref 70–99)
Glucose-Capillary: 80 mg/dL (ref 70–99)

## 2014-06-06 LAB — POCT I-STAT 3, ART BLOOD GAS (G3+)
ACID-BASE EXCESS: 4 mmol/L — AB (ref 0.0–2.0)
Bicarbonate: 28.9 mEq/L — ABNORMAL HIGH (ref 20.0–24.0)
O2 Saturation: 100 %
PH ART: 7.456 — AB (ref 7.350–7.450)
TCO2: 30 mmol/L (ref 0–100)
pCO2 arterial: 40.8 mmHg (ref 35.0–45.0)
pO2, Arterial: 316 mmHg — ABNORMAL HIGH (ref 80.0–100.0)

## 2014-06-06 LAB — HEPARIN LEVEL (UNFRACTIONATED): HEPARIN UNFRACTIONATED: 0.7 [IU]/mL (ref 0.30–0.70)

## 2014-06-06 MED ORDER — BIOTENE DRY MOUTH MT LIQD: 15.0000 mL | Freq: Four times a day (QID) | OROMUCOSAL | Status: DC

## 2014-06-06 MED ORDER — CHLORHEXIDINE GLUCONATE 0.12 % MT SOLN: 15.0000 mL | Freq: Two times a day (BID) | OROMUCOSAL | Status: DC

## 2014-06-06 MED ORDER — INSULIN ASPART 100 UNIT/ML ~~LOC~~ SOLN
0.0000 [IU] | SUBCUTANEOUS | Status: DC
  Administered 2014-06-06 – 2014-06-07 (×5): 2 [IU] via SUBCUTANEOUS
  Administered 2014-06-08: 3 [IU] via SUBCUTANEOUS
  Administered 2014-06-08: 2 [IU] via SUBCUTANEOUS
  Administered 2014-06-08: 3 [IU] via SUBCUTANEOUS
  Administered 2014-06-08: 2 [IU] via SUBCUTANEOUS
  Administered 2014-06-09: 3 [IU] via SUBCUTANEOUS
  Administered 2014-06-09: 2 [IU] via SUBCUTANEOUS
  Administered 2014-06-09: 5 [IU] via SUBCUTANEOUS
  Administered 2014-06-09 (×2): 3 [IU] via SUBCUTANEOUS
  Administered 2014-06-09: 2 [IU] via SUBCUTANEOUS
  Administered 2014-06-10 (×4): 3 [IU] via SUBCUTANEOUS
  Administered 2014-06-10: 5 [IU] via SUBCUTANEOUS
  Administered 2014-06-10 – 2014-06-11 (×5): 3 [IU] via SUBCUTANEOUS
  Administered 2014-06-11: 17:00:00 via SUBCUTANEOUS

## 2014-06-06 MED ORDER — FENTANYL CITRATE 0.05 MG/ML IJ SOLN
100.0000 ug | Freq: Once | INTRAMUSCULAR | Status: AC
  Administered 2014-06-06: 100 ug via INTRAVENOUS

## 2014-06-06 MED ORDER — PROPOFOL 10 MG/ML IV EMUL
5.0000 ug/kg/min | INTRAVENOUS | Status: DC
  Administered 2014-06-06: 70 ug/kg/min via INTRAVENOUS
  Administered 2014-06-06: 30 ug/kg/min via INTRAVENOUS
  Administered 2014-06-06: 50 ug/kg/min via INTRAVENOUS
  Administered 2014-06-06: 15 ug/kg/min via INTRAVENOUS
  Administered 2014-06-07: 50 ug/kg/min via INTRAVENOUS
  Administered 2014-06-07: 60 ug/kg/min via INTRAVENOUS
  Administered 2014-06-07 – 2014-06-08 (×5): 50 ug/kg/min via INTRAVENOUS
  Administered 2014-06-08: 60 ug/kg/min via INTRAVENOUS
  Administered 2014-06-09: 20 ug/kg/min via INTRAVENOUS
  Filled 2014-06-06 (×17): qty 100

## 2014-06-06 MED ORDER — POTASSIUM CHLORIDE CRYS ER 20 MEQ PO TBCR: 30.0000 meq | EXTENDED_RELEASE_TABLET | ORAL | Status: DC

## 2014-06-06 MED ORDER — HEPARIN (PORCINE) IN NACL 100-0.45 UNIT/ML-% IJ SOLN
1000.0000 [IU]/h | INTRAMUSCULAR | Status: DC
  Administered 2014-06-07: 1400 [IU]/h via INTRAVENOUS
  Administered 2014-06-07: 1300 [IU]/h via INTRAVENOUS
  Administered 2014-06-08 – 2014-06-11 (×3): 1400 [IU]/h via INTRAVENOUS
  Administered 2014-06-12: 1150 [IU]/h via INTRAVENOUS
  Filled 2014-06-06 (×13): qty 250

## 2014-06-06 MED ORDER — POTASSIUM CHLORIDE 20 MEQ/15ML (10%) PO LIQD
30.0000 meq | ORAL | Status: AC
  Administered 2014-06-06 (×2): 30 meq via ORAL
  Filled 2014-06-06 (×3): qty 30

## 2014-06-06 MED ORDER — MAGNESIUM SULFATE 40 MG/ML IJ SOLN
2.0000 g | Freq: Once | INTRAMUSCULAR | Status: AC
  Administered 2014-06-06: 2 g via INTRAVENOUS
  Filled 2014-06-06: qty 50

## 2014-06-06 MED ORDER — MIDAZOLAM HCL 2 MG/2ML IJ SOLN
2.0000 mg | Freq: Once | INTRAMUSCULAR | Status: AC
  Administered 2014-06-06: 2 mg via INTRAVENOUS

## 2014-06-06 MED ORDER — PROPOFOL BOLUS VIA INFUSION
80.0000 mg | Freq: Once | INTRAVENOUS | Status: AC
  Administered 2014-06-06: 80 mg via INTRAVENOUS
  Filled 2014-06-06: qty 80

## 2014-06-06 NOTE — Progress Notes (Addendum)
ANTICOAGULATION CONSULT NOTE - Follow Up Consult  Pharmacy Consult for Heparin Indication: NSTEMI  No Known Allergies  Patient Measurements: Height: 5\' 6"  (167.6 cm) Weight: 150 lb 5.7 oz (68.2 kg) IBW/kg (Calculated) : 63.8 Heparin Dosing Weight: 68 kg  Vital Signs: Temp: 97 F (36.1 C) (07/12 1200) Temp src: Axillary (07/12 1200) BP: 98/50 mmHg (07/12 1200) Pulse Rate: 63 (07/12 1200)  Labs:  Recent Labs  06/03/14 1500 06/03/14 2152  06/04/14 0322  06/04/14 2055 06/05/14 0223 06/06/14 0313  HGB 11.7*  --   --  12.1*  --   --  10.8* 9.8*  HCT 34.3*  --   --  35.1*  --   --  31.1* 28.8*  PLT 170  --   --  199  --   --  167 168  APTT 33  --   --   --   --   --   --   --   LABPROT 14.4  --   --   --   --   --   --   --   INR 1.12  --   --   --   --   --   --   --   HEPARINUNFRC  --   --   < > 0.25*  < > 0.68 0.65 0.70  CREATININE 0.51  --   --  0.50  --   --  0.52 0.59  TROPONINI 2.59* 4.15*  --  4.07*  --   --   --   --   < > = values in this interval not displayed.  Estimated Creatinine Clearance: 80.9 ml/min (by C-G formula based on Cr of 0.59).  Assessment:   Heparin remains upper-range therapeutic (0.70) on 1400 units/hr.  Intubated overnight. Planning cardiac cath after extubation.  Goal of Therapy:  Heparin level 0.3-0.7 units/ml Monitor platelets by anticoagulation protocol: Yes   Plan:   Continue heparin drip at 1400 units/hr.  Continue daily heparin level and CBC.  Arty Baumgartner, [place] Pager: [phone] 06/06/2014,12:28 PM  ADDENDUM RN called that patient's urine is tinged pink on urometer. No other overt bleeding.  Plan: -instructed RN to hold heparin x1 hour and then resume gtt at 1300 units/hr at 1930 this evening -HL in AM Orders entered.  Philipp Callegari D. Shadiyah Wernli, PharmD, BCPS Clinical Pharmacist Pager: [phone] 06/06/2014 6:17 PM

## 2014-06-06 NOTE — Progress Notes (Signed)
Med - Pink hue noted in urometer.E-link Dr and pharmacist informed .

## 2014-06-06 NOTE — Progress Notes (Signed)
[facility] ADULT ICU REPLACEMENT PROTOCOL FOR AM LAB REPLACEMENT ONLY  The patient does apply for the [facility] Adult ICU Electrolyte Replacment Protocol based on the criteria listed below:   1. Is GFR >/= 40 ml/min? Yes.    Patient's GFR today is >90 2. Is urine output >/= 0.5 ml/kg/hr for the last 6 hours? Yes.   Patient's UOP is 0.6 ml/kg/hr 3. Is BUN < 60 mg/dL? Yes.    Patient's BUN today is 21 4. Abnormal electrolyte(s): K+3.1 5. Ordered repletion with: protocol 6. If a panic level lab has been reported, has the CCM MD in charge been notified? Yes.  .   Physician:  E Deterding  Concepcion Living [facility] 06/06/2014 5:29 AM

## 2014-06-06 NOTE — Progress Notes (Signed)
PULMONARY / CRITICAL CARE MEDICINE   Name: Roy [name] MRN: [id number] DOB: Oct 04, 1946    ADMISSION DATE:  06/03/2014 CONSULTATION DATE:  06/03/2014  REFERRING MD :  Lacinda Axon ([facility]) PRIMARY SERVICE: PCCM  CHIEF COMPLAINT:  SOB, Respiratory Distress  BRIEF PATIENT DESCRIPTION: Patient is a 68 year old male, current smoker (120 pack years) with PMH of COPD, alcohol abuse, chronic bronchitis, TIA, HTN, LBBB who presented to [facility] ED via EMS 7/9 for progressively worsening SOB and respiratory distress. Per EMS, sats in 70s on arrival to pt's home. Patient was started on CPAP and transitioned to BiPAP in ED. Concern ischemia, MI  LINES / TUBES: PIV  CULTURES: 7/9 BC x 2 >>> 7/9 UC >>> 7/9 MRSA by PCR >>>  ANTIBIOTICS: Azithromycin 7/9 >>> Ceftriaxone 7/9 >>>    SIGNIFICANT EVENTS / STUDIES:  7/09 - Presented to [facility] ED w/ SOB 7/09 - EKG >>> no acute MI, troponin of 1.46 7/09 - Tx to [facility] 7/10- requires high flow O2, ETOH WD concern 7/10 - TTE > LVEF 20%, physiology suggestive of restriction, LAE    SUBJECTIVE/OVERNIGHT/INTERVAL HX  06/06/14: Intubated early  Hours following severe agitation, hallucinations and acute hypoxemic resp failure. Still agitated needing diprivan and prn ativan   Currently in IV heparin gtt, diprivan gtt and lasix gtt. Not on lasix   VITAL SIGNS: Temp:  [97.5 F (36.4 C)-98.4 F (36.9 C)] 98 F (36.7 C) (07/12 0800) Pulse Rate:  [59-127] 102 (07/12 0900) Resp:  [16-41] 24 (07/12 0800) BP: (74-154)/(48-124) 149/62 mmHg (07/12 0900) SpO2:  [61 %-100 %] 100 % (07/12 0900) FiO2 (%):  [40 %-100 %] 40 % (07/12 0746) Weight:  [68.2 kg (150 lb 5.7 oz)] 68.2 kg (150 lb 5.7 oz) (07/12 0439)  HEMODYNAMICS:   VENTILATOR SETTINGS: Vent Mode:  [-] PRVC FiO2 (%):  [40 %-100 %] 40 % Set Rate:  [16 bmp] 16 bmp Vt Set:  [550 mL] 550 mL PEEP:  [10 cmH20] 10 cmH20 Plateau Pressure:  [21 MVH84-69 cmH20] 21 cmH20  INTAKE / OUTPUT: Intake/Output     07/11 0701  - 07/12 0700 07/12 0701 - 07/13 0700   P.O. 540    I.V. (mL/kg) 664.2 (9.7)    IV Piggyback 50    Total Intake(mL/kg) 1254.2 (18.4)    Urine (mL/kg/hr) 1625 (1)    Total Output 1625     Net -370.9          Stool Occurrence 3 x     PHYSICAL EXAMINATION:  Gen: sitting up, comfortable, no acute distress HEENT: NCAT, EOMi,  PULM: Crackles bases, wheezing L > R CV: RRR, no mgr, JVD AB: BS+, soft, nontender, no hsm Ext: warm, no edema, no clubbing, no cyanosis Derm: no rash or skin breakdown Neuro: Awake and alert, oriented to situation, place etc   LABS:  PULMONARY  Recent Labs Lab 06/03/14 1005 06/03/14 1440 06/04/14 0409 06/06/14 0047  PHART 7.226* 7.395 7.475* 7.456*  PCO2ART 40.3 36.0 36.0 40.8  PO2ART 89.4 53.0* 89.3 316.0*  HCO3 16.1* 22.0 26.2* 28.9*  TCO2 15.2 23 27.3 30  O2SAT 94.2 87.0 96.7 100.0    CBC  Recent Labs Lab 06/04/14 0322 06/05/14 0223 06/06/14 0313  HGB 12.1* 10.8* 9.8*  HCT 35.1* 31.1* 28.8*  WBC 12.7* 11.9* 11.3*  PLT 199 167 168    COAGULATION  Recent Labs Lab 06/03/14 1500  INR 1.12    CARDIAC   Recent Labs Lab 06/03/14 1011 06/03/14 1500 06/03/14 2152 06/04/14 0322  TROPONINI 1.46* 2.59* 4.15* 4.07*    Recent Labs Lab 06/03/14 1011  PROBNP 5223.0*     CHEMISTRY  Recent Labs Lab 06/03/14 1011 06/03/14 1500 06/04/14 0322 06/05/14 0223 06/06/14 0313  NA 126* 126* 130* 130* 131*  K 3.8 4.2 4.5 3.8 3.1*  CL 88* 87* 86* 87* 87*  CO2 18* 19 26 26 25   GLUCOSE 181* 182* 163* 134* 135*  BUN 5* 7 9 16 21   CREATININE 0.57 0.51 0.50 0.52 0.59  CALCIUM 8.6 8.6 8.8 8.2* 8.0*  MG  --  1.7  --  1.8  --   PHOS  --  4.1  --  4.1  --    Estimated Creatinine Clearance: 80.9 ml/min (by C-G formula based on Cr of 0.59).   LIVER  Recent Labs Lab 06/03/14 1011 06/03/14 1500  AST 47* 66*  ALT 29 27  ALKPHOS 131* 117  BILITOT 0.3 0.4  PROT 7.5 7.3  ALBUMIN 3.7 3.6  INR  --  1.12     INFECTIOUS  Recent  Labs Lab 06/03/14 1500 06/04/14 0322 06/05/14 0225  LATICACIDVEN 3.9*  --   --   PROCALCITON 0.25 0.39 0.24     ENDOCRINE CBG (last 3)   Recent Labs  06/06/14 0438  GLUCAP 142*         IMAGING x48h  Portable Chest Xray  06/06/2014   CLINICAL DATA:  Endotracheal tube placement.  EXAM: PORTABLE CHEST - 1 VIEW  COMPARISON:  06/05/2014  FINDINGS: Interval placement of an endotracheal tube with tip measuring 4.3 cm above the carinal. Shallow inspiration. Mild cardiac enlargement with diffuse bilateral pulmonary infiltration, likely edema. Postoperative change in the right shoulder. No blunting of costophrenic angles. No pneumothorax.  IMPRESSION: Endotracheal tube tip measures 4.3 cm above the carina. Diffuse bilateral pulmonary parenchymal infiltrates again demonstrated.   Electronically Signed   By: Lucienne Capers M.D.   On: 06/06/2014 00:29   Dg Chest Port 1 View  06/05/2014   CLINICAL DATA:  Pulmonary edema.  EXAM: PORTABLE CHEST - 1 VIEW  COMPARISON:  06/03/2014  FINDINGS: Bilateral pulmonary edema has increased diffusely on the left and in the right upper lobe. Edema at the right base appears slightly improved.  Small right effusion. Heart size is within normal limits. No acute osseous abnormality.  IMPRESSION: Pulmonary edema has slightly increased.   Electronically Signed   By: Rozetta Nunnery M.D.   On: 06/05/2014 08:15       ASSESSMENT / PLAN:  PULMONARY A: Acute hypoxemic respiratory failure due to CHF complicated by COPD Treating empirically as CAP COPD but not clearly in exacerbation Current smoker, 120 pack-years    - s/p intubation 06/06/14 AM due to severe delirum   P: - full vent support - Supplementary O2 for sats > 92% - continue lasix - continue antibiotics for now - wean/stop solumedrol - continue scheduled and prn albuterol  CARDIOVASCULAR A: NSTEMI Acute decompensated systolic heart failure  P: - per cardiology - asa, hep drip - continue  lasix > per cardiology - target net negative I/O  RENAL A: Mild hypomag and hypok  P:  - replete mag and K (K repletion done by elink) - KVO IVF - Follow BMets  GASTROINTESTINAL A: Hx of tubular adenoma of colon P: - continue diet - PPI  HEMATOLOGIC A: Leukocytosis Hx thrombocytopenia P:  - cbc daily - PRBC for hgb < 8gm% due to NSTEMI  INFECTIOUS A: Severe CAP? Possibly   P:   -  continue antibiotics, but if oxygen needs and CXR improves with diuresis would stop soon - f/u culture  ENDOCRINE A: No acute issues  P:   - cbg  NEUROLOGIC A: Acute encephalopathy due to Alcohol withdrawal > improved - hx of hospitalization for w/d in 2009 Hx Anxiety, Depression Hx TIA, 2009   - severe delirium failed precedex and intubated 06/06/14  P:  - Ativan prn, fent prn - diprivan gtt -add fent gtt if needed - Thiamine/folate   GLOBAL  - wife updated at bedside 06/06/14. He is now intubated. Expect prolonged course     The patient is critically ill with multiple organ systems failure and requires high complexity decision making for assessment and support, frequent evaluation and titration of therapies, application of advanced monitoring technologies and extensive interpretation of multiple databases.   Critical Care Time devoted to patient care services described in this note is  35  Minutes.  Dr. Brand Males, M.D., [facility].C.P Pulmonary and Critical Care Medicine Staff Physician [place] [facility] Pager: [phone], If no answer or between  15:00h - 7:00h: call [phone]  06/06/2014 9:39 AM

## 2014-06-06 NOTE — Progress Notes (Signed)
    Subjective:  Intubated last night due to delerium and agitation. Higher supplemental O2 requirements yesterday. Lasix gtts started yesterday. Net negative now -1.6L. Sodium appears to be improving on lasix, now up to 131. Potassium is low today. Creatinine is stable.  Objective:  Filed Vitals:   06/06/14 0600 06/06/14 0700 06/06/14 0800 06/06/14 0900  BP: 105/48 135/78 116/68 149/62  Pulse: 65 100 89 102  Temp:   98 F (36.7 C)   TempSrc:   Axillary   Resp: 18 25 24    Height:      Weight:      SpO2: 100% 100% 100% 100%    Intake/Output from previous day:  Intake/Output Summary (Last 24 hours) at 06/06/14 1015 Last data filed at 06/06/14 1000  Gross per 24 hour  Intake 1071.45 ml  Output   1565 ml  Net -493.55 ml    Physical Exam: Physical exam: Well-developed well-nourished, mild respiratory distress on [place] and NRB Skin is warm and dry.  HEENT is normal.  Neck is supple.  JVP is elevated to 3 cm above the sternal angle. Chest basilar crackles and diminished BS Cardiovascular exam distant heart sounds; regular rate and rhythm.  Abdominal exam nontender or distended. No masses palpated. Extremities show no edema. neuro grossly intact   Lab Results: Basic Metabolic Panel:  Recent Labs  06/03/14 1500  06/05/14 0223 06/06/14 0313  NA 126*  < > 130* 131*  K 4.2  < > 3.8 3.1*  CL 87*  < > 87* 87*  CO2 19  < > 26 25  GLUCOSE 182*  < > 134* 135*  BUN 7  < > 16 21  CREATININE 0.51  < > 0.52 0.59  CALCIUM 8.6  < > 8.2* 8.0*  MG 1.7  --  1.8  --   PHOS 4.1  --  4.1  --   < > = values in this interval not displayed. CBC:  Recent Labs  06/05/14 0223 06/06/14 0313  WBC 11.9* 11.3*  NEUTROABS  --  9.5*  HGB 10.8* 9.8*  HCT 31.1* 28.8*  MCV 83.6 84.0  PLT 167 168   Cardiac Enzymes:  Recent Labs  06/03/14 1500 06/03/14 2152 06/04/14 0322  TROPONINI 2.59* 4.15* 4.07*     Assessment/Plan:  1 non-ST elevation myocardial infarction-patient has ruled  in. Troponin elevated to 4.15 and trending down. Continue aspirin, heparin, statin and low-dose metoprolol. He will ultimately need cardiac catheterization. Hopefully this can be performed on Monday, however, will need to wait until extubation. The risks and benefits were discussed and he agrees to proceed. 2 alcohol abuse-the patient drinks a 12 pack daily. I am concerned about the possibility of withdrawal. Need to follow closely. Patient educated on discontinuing. 3 tobacco abuse-patient counseled on discontinuing. 4 hyperlipidemia-on statin. 5 hypertension- metoprolol 12.5 mg by mouth twice a day added. Follow blood pressure and adjust medications as needed. 6 acute diastolic congestive heart failure-patient presented with pulmonary edema. This was most likely ischemia mediated. Continue diuresis with lasix gtts - sodium is improving and he is net negative. Renal function is stable.  7 COPD-management per critical care medicine. 8 history of pulmonary nodules 9 hyponatremia-improving on lasix gtts. 10. New cardiomyopathy, presumably ischemic - Echo shows LVEF of 20%, with global hypokinesis. On lasix gtts.  Pixie Casino, MD, [facility] Attending Cardiologist [facility]  Reeta Kuk C 06/06/2014, 10:15 AM

## 2014-06-06 NOTE — Progress Notes (Signed)
Duan Scharnhorst (spouse) was notified of patients change in condition and his need for intubation. Mrs. [place]eville voiced no questions or concerns at this time. She stated she would be here in the morning to speak with rounding doctor.

## 2014-06-06 NOTE — Procedures (Signed)
Intubation Procedure Note Roy [name] [id number] 1946/10/19  Procedure: Intubation Indications: Respiratory insufficiency  Procedure Details Consent: Unable to obtain consent because of emergent medical necessity. Time Out: Verified patient identification, verified procedure, site/side was marked, verified correct patient position, special equipment/implants available, medications/allergies/relevent history reviewed, required imaging and test results available.  Performed  Maximum sterile technique was used including gloves, gown, hand hygiene and mask.  MAC and 4    Evaluation Hemodynamic Status: BP stable throughout; O2 sats: transiently fell during during procedure  51% then recovered Patient's Current Condition: stable Complications: No apparent complications Patient did tolerate procedure well. Chest X-ray ordered to verify placement.  CXR: pending.   Raylene Miyamoto 06/06/2014

## 2014-06-07 ENCOUNTER — Encounter: Payer: Self-pay | Admitting: Cardiovascular Disease

## 2014-06-07 ENCOUNTER — Encounter: Payer: Medicare Other | Admitting: Cardiovascular Disease

## 2014-06-07 ENCOUNTER — Inpatient Hospital Stay (HOSPITAL_COMMUNITY): Payer: Medicare Other

## 2014-06-07 LAB — BASIC METABOLIC PANEL
Anion gap: 17 — ABNORMAL HIGH (ref 5–15)
Anion gap: 15 (ref 5–15)
Anion gap: 16 — ABNORMAL HIGH (ref 5–15)
BUN: 13 mg/dL (ref 6–23)
BUN: 13 mg/dL (ref 6–23)
BUN: 12 mg/dL (ref 6–23)
CALCIUM: 8.1 mg/dL — AB (ref 8.4–10.5)
CO2: 27 mEq/L (ref 19–32)
CO2: 26 mEq/L (ref 19–32)
CO2: 27 mEq/L (ref 19–32)
CREATININE: 0.56 mg/dL (ref 0.50–1.35)
CREATININE: 0.5 mg/dL (ref 0.50–1.35)
CREATININE: 0.5 mg/dL (ref 0.50–1.35)
Calcium: 8.3 mg/dL — ABNORMAL LOW (ref 8.4–10.5)
Calcium: 8.7 mg/dL (ref 8.4–10.5)
Chloride: 94 mEq/L — ABNORMAL LOW (ref 96–112)
Chloride: 96 mEq/L (ref 96–112)
Chloride: 93 mEq/L — ABNORMAL LOW (ref 96–112)
GFR calc Af Amer: >90 mL/min (ref >90)
GLUCOSE: 97 mg/dL (ref 70–99)
Glucose, Bld: 110 mg/dL — ABNORMAL HIGH (ref 70–99)
Glucose, Bld: 118 mg/dL — ABNORMAL HIGH (ref 70–99)
Potassium: 3.2 mEq/L — ABNORMAL LOW (ref 3.7–5.3)
Potassium: 4.5 mEq/L (ref 3.7–5.3)
Potassium: 4 mEq/L (ref 3.7–5.3)
Sodium: 138 mEq/L (ref 137–147)
Sodium: 137 mEq/L (ref 137–147)
Sodium: 136 mEq/L — ABNORMAL LOW (ref 137–147)

## 2014-06-07 LAB — CBC WITH DIFFERENTIAL/PLATELET
Basophils Absolute: 0 10*3/uL (ref 0.0–0.1)
Basophils Relative: 0 % (ref 0–1)
Eosinophils Absolute: 0 10*3/uL (ref 0.0–0.7)
Eosinophils Relative: 0 % (ref 0–5)
HCT: 28.9 % — ABNORMAL LOW (ref 39.0–52.0)
HEMOGLOBIN: 9.4 g/dL — AB (ref 13.0–17.0)
LYMPHS ABS: 1.3 10*3/uL (ref 0.7–4.0)
LYMPHS PCT: 16 % (ref 12–46)
MCH: 28.1 pg (ref 26.0–34.0)
MCHC: 32.5 g/dL (ref 30.0–36.0)
MCV: 86.5 fL (ref 78.0–100.0)
MONOS PCT: 16 % — AB (ref 3–12)
Monocytes Absolute: 1.2 10*3/uL — ABNORMAL HIGH (ref 0.1–1.0)
NEUTROS PCT: 68 % (ref 43–77)
Neutro Abs: 5.2 10*3/uL (ref 1.7–7.7)
PLATELETS: 216 10*3/uL (ref 150–400)
RBC: 3.34 MIL/uL — ABNORMAL LOW (ref 4.22–5.81)
RDW: 15 % (ref 11.5–15.5)
WBC: 7.7 10*3/uL (ref 4.0–10.5)

## 2014-06-07 LAB — PRO B NATRIURETIC PEPTIDE: PRO B NATRI PEPTIDE: 4105 pg/mL — AB (ref 0–125)

## 2014-06-07 LAB — GLUCOSE, CAPILLARY
GLUCOSE-CAPILLARY: 99 mg/dL (ref 70–99)
GLUCOSE-CAPILLARY: 108 mg/dL — AB (ref 70–99)
GLUCOSE-CAPILLARY: 123 mg/dL — AB (ref 70–99)
Glucose-Capillary: 106 mg/dL — ABNORMAL HIGH (ref 70–99)
Glucose-Capillary: 108 mg/dL — ABNORMAL HIGH (ref 70–99)
Glucose-Capillary: 122 mg/dL — ABNORMAL HIGH (ref 70–99)

## 2014-06-07 LAB — HEPARIN LEVEL (UNFRACTIONATED): HEPARIN UNFRACTIONATED: 0.25 [IU]/mL — AB (ref 0.30–0.70)

## 2014-06-07 LAB — TROPONIN I: TROPONIN I: 4.84 ng/mL — AB (ref <0.30)

## 2014-06-07 LAB — LACTIC ACID, PLASMA: Lactic Acid, Venous: 0.9 mmol/L (ref 0.5–2.2)

## 2014-06-07 LAB — MAGNESIUM: Magnesium: 1.9 mg/dL (ref 1.5–2.5)

## 2014-06-07 LAB — PHOSPHORUS: Phosphorus: 3 mg/dL (ref 2.3–4.6)

## 2014-06-07 MED ORDER — MAGNESIUM SULFATE 40 MG/ML IJ SOLN
INTRAMUSCULAR | Status: AC
  Filled 2014-06-07: qty 50

## 2014-06-07 MED ORDER — POTASSIUM CHLORIDE 20 MEQ/15ML (10%) PO LIQD
40.0000 meq | Freq: Once | ORAL | Status: AC
  Administered 2014-06-07: 40 meq
  Filled 2014-06-07: qty 30

## 2014-06-07 MED ORDER — POTASSIUM CHLORIDE 10 MEQ/50ML IV SOLN: 10.0000 meq | INTRAVENOUS | Status: DC

## 2014-06-07 MED ORDER — VITAL HIGH PROTEIN PO LIQD
1000.0000 mL | ORAL | Status: DC
  Filled 2014-06-07 (×2): qty 1000

## 2014-06-07 MED ORDER — MAGNESIUM SULFATE 50 % IJ SOLN
2.0000 g | Freq: Once | INTRAVENOUS | Status: AC
  Administered 2014-06-07: 2 g via INTRAVENOUS
  Filled 2014-06-07: qty 4

## 2014-06-07 MED ORDER — POTASSIUM CHLORIDE 20 MEQ/15ML (10%) PO LIQD
ORAL | Status: AC
  Filled 2014-06-07: qty 30

## 2014-06-07 MED ORDER — VITAL HIGH PROTEIN PO LIQD
1000.0000 mL | ORAL | Status: DC
  Administered 2014-06-07 – 2014-06-10 (×4): 1000 mL
  Filled 2014-06-07 (×7): qty 1000

## 2014-06-07 MED ORDER — POTASSIUM CHLORIDE 20 MEQ/15ML (10%) PO LIQD
40.0000 meq | ORAL | Status: AC
  Administered 2014-06-07 (×2): 40 meq via ORAL
  Filled 2014-06-07 (×2): qty 30

## 2014-06-07 NOTE — Care Management Note (Addendum)
  Page 2 of 2   06/21/2014     4:11:42 PM CARE MANAGEMENT NOTE 06/21/2014  Patient:  Roy [name],Roy [name]   Account Number:  [id number]  Date Initiated:  06/03/2014  Documentation initiated by:  [facility]  Subjective/Objective Assessment:   Admitted with resp failure - on bipap     Action/Plan:   CM to follow for dispositon needs   Anticipated DC Date:  06/21/2014   Anticipated DC Plan:  SKILLED NURSING FACILITY  In-house referral  Clinical Social Worker      DC Planning Services  CM consult      Choice offered to / List presented to:             Status of service:  Completed, signed off Medicare Important Message given?  YES (If response is "NO", the following Medicare IM given date fields will be blank) Date Medicare IM given:  06/21/2014 Medicare IM given by:  Gualberto Wahlen Date Additional Medicare IM given:  06/16/2014 Additional Medicare IM given by:  CAMELLIA WOOD  Discharge Disposition:  [place]  Per UR Regulation:  Reviewed for med. necessity/level of care/duration of stay  If discussed at [place] of Stay Meetings, dates discussed:   06/08/2014  06/10/2014  06/22/2014    Comments:  Donella Stade Cormick Moss RN, BSN, MSHL, CCM  Nurse - Case Manager,  (Unit [phone]  06/21/2014 Disposition Plan:  SNF:  [place] active.  06/13/14 16:00 CM called [facility] to update pt's discharge plan. RN informed CM  discharge delayed until after heart cath scheduled on Monday 06/14/14.  [facility] states [facility] will hold bed.  No other CM needs were communicated.  Mariane Masters, BSN, CM [phone].  Contact:  Ren, [place] [phone]                 Belcher,Sherry Daughter [phone]  06-11-14 4pm Luz Lex, [place] Extubated today.  Talked with physician about possible [facility] candidate.  [place] with that.  Referral made to ltachs. Talked with wife and daughter Judeen Hammans who wife states is the POA.  Wife and daughter prefer [facility] if have  bed when ready.  Physician ok with tx tomorrow is still stable. [facility] offering bed.  Family updated.  [facility].  06-10-14 [place], RNBSN [phone] Continues on vent - diuresing.  Hope to extubated tomorrow.

## 2014-06-07 NOTE — Progress Notes (Signed)
ANTICOAGULATION CONSULT NOTE - Follow Up Consult  Pharmacy Consult for Heparin Indication: NSTEMI  No Known Allergies  Patient Measurements: Height: 5\' 6"  (167.6 cm) Weight: 150 lb 5.7 oz (68.2 kg) IBW/kg (Calculated) : 63.8 Heparin Dosing Weight: 68 kg  Vital Signs: Temp: 98.6 F (37 C) (07/13 0700) Temp src: Oral (07/13 0700) BP: 100/58 mmHg (07/13 0800) Pulse Rate: 84 (07/13 0800)  Labs:  Recent Labs  06/05/14 0223 06/06/14 0313 06/07/14 0239  HGB 10.8* 9.8* 9.4*  HCT 31.1* 28.8* 28.9*  PLT 167 168 216  HEPARINUNFRC 0.65 0.70 0.25*  CREATININE 0.52 0.59 0.56  TROPONINI  --   --  4.84*    Estimated Creatinine Clearance: 80.9 ml/min (by C-G formula based on Cr of 0.56).  Assessment: 68 yo male with NSTEMI on heparin and heparin level is below goal (HL= 0.25). Noted heparin held last pm for concern of pink hue in urometer (none noted this am). Planning cardiac cath after extubation. No  Goal of Therapy:  Heparin level 0.3-0.7 units/ml Monitor platelets by anticoagulation protocol: Yes   Plan:  -Increase heparin to 1400 units/hr -Heparin level is am (patient has been at goal previously on 1400 units/hr)  Hildred Laser, Pharm D 06/07/2014 8:30 AM

## 2014-06-07 NOTE — Progress Notes (Signed)
INITIAL NUTRITION ASSESSMENT  DOCUMENTATION CODES Per approved criteria  -Not Applicable   INTERVENTION: Initiate Vital High Protein via enteral feeding tube at 25 ml/hr, increase by 10 ml q 4 hours, to goal of 65 ml/hr. Goal regimen will provide: 1560 kcal, 137 grams protein, and 1304 ml free water. *Rest of kcal will be met with current propofol infusion. RD to continue to follow nutrition care plan.  NUTRITION DIAGNOSIS: Inadequate oral intake related to inability to eat as evidenced by npo status.   Goal: Intake to meet >90% of estimated nutrition needs.  Monitor:  weight trends, lab trends, I/O's, ventilator use, initiation of nutrition support  Reason for Assessment: Ventilator Use, Low Braden, Consult for TF per MD  68 y.o. male  Admitting Dx: acute hypoxemic respiratory failure  ASSESSMENT: PMHx significant for COPD, ETOH abuse, chronic bronchitis, TIA, HTN, LBBB. Admitted with progressively worsening SOB and respiratory distress. Work-up reveals acute hypoxemic respiratory failure.  Per chart, pt smokes 2-3 packs of cigarettes daily and drinks a case of beer daily.  Intubated 7/12 after pt developed acute hypoxemic respiratory failure, severe agitation and hallucinations.  Patient is currently intubated on ventilator support MV: 13 L/min Temp (24hrs), Avg:98.9 F (37.2 C), Min:97 F (36.1 C), Max:100.7 F (38.2 C)  Propofol: 10.2 ml/hr - provides 269 kcal daily  Discussed initiation of nutrition support with Dr. Titus Mould.  CBG's: 99 - 108 Sodium WNL Potassium low at 3.2 --> ordered for IV KCl Phosphorus WNL Magnesium WNL   Height: Ht Readings from Last 1 Encounters:  06/04/14 5' 6"  (1.676 m)    Weight: Wt Readings from Last 1 Encounters:  06/07/14 150 lb 5.7 oz (68.2 kg)    Ideal Body Weight: 142 lb  % Ideal Body Weight: 106%  Wt Readings from Last 25 Encounters:  06/07/14 150 lb 5.7 oz (68.2 kg)  05/31/14 160 lb (72.576 kg)  05/04/14 145  lb (65.772 kg)  05/04/14 145 lb (65.772 kg)  04/26/14 158 lb (71.668 kg)  04/14/14 140 lb (63.504 kg)  04/14/14 140 lb (63.504 kg)  03/30/14 140 lb (63.504 kg)  03/30/14 140 lb (63.504 kg)  03/03/14 155 lb 3.2 oz (70.398 kg)  05/21/13 146 lb (66.225 kg)  10/09/12 139 lb 12.4 oz (63.4 kg)  06/18/12 146 lb 9.6 oz (66.497 kg)  05/13/12 147 lb 9.6 oz (66.951 kg)  01/15/12 158 lb (71.668 kg)  04/09/11 153 lb (69.4 kg)  11/09/10 146 lb (66.225 kg)  10/10/10 150 lb (68.04 kg)  10/03/10 151 lb (68.493 kg)   Usual Body Weight: 140 - 150 lb  % Usual Body Weight: 100%   BMI:  Body mass index is 24.28 kg/(m^2). WNL  Estimated Nutritional Needs: Kcal: 1914 Protein: at least 102 g Fluid: approx 2 liters daily  Skin: intact  Diet Order: NPO  EDUCATION NEEDS: -No education needs identified at this time   Intake/Output Summary (Last 24 hours) at 06/07/14 0944 Last data filed at 06/07/14 0800  Gross per 24 hour  Intake 1484.28 ml  Output   2345 ml  Net -860.72 ml    Last BM: 7/11  Labs:   Recent Labs Lab 06/03/14 1500  06/05/14 0223 06/06/14 0313 06/07/14 0239  NA 126*  < > 130* 131* 138  K 4.2  < > 3.8 3.1* 3.2*  CL 87*  < > 87* 87* 94*  CO2 19  < > 26 25 27   BUN 7  < > 16 21 13   CREATININE 0.51  < >  0.52 0.59 0.56  CALCIUM 8.6  < > 8.2* 8.0* 8.1*  MG 1.7  --  1.8  --  1.9  PHOS 4.1  --  4.1  --  3.0  GLUCOSE 182*  < > 134* 135* 97  < > = values in this interval not displayed.  CBG (last 3)   Recent Labs  06/07/14 0001 06/07/14 0404 06/07/14 0718  GLUCAP 106* 99 108*    Scheduled Meds: . antiseptic oral rinse  15 mL Mouth Rinse QID  . aspirin  81 mg Oral Daily  . atorvastatin  80 mg Oral q1800  . cefTRIAXone (ROCEPHIN)  IV  1 g Intravenous Q24H  . chlorhexidine  15 mL Mouth Rinse BID  . feeding supplement (VITAL HIGH PROTEIN)  1,000 mL Per Tube Q24H  . folic acid  1 mg Intravenous Daily  . insulin aspart  0-15 Units Subcutaneous 6 times per day  .  ipratropium-albuterol  3 mL Nebulization Q4H  . magnesium sulfate LVP 250-500 ml  2 g Intravenous Once  . metoprolol tartrate  12.5 mg Oral BID  . pantoprazole (PROTONIX) IV  40 mg Intravenous Daily  . potassium chloride  40 mEq Per Tube Once  . thiamine IV  100 mg Intravenous Daily    Continuous Infusions: . sodium chloride 10 mL/hr at 06/05/14 2000  . furosemide (LASIX) infusion 4 mg/hr (06/07/14 0800)  . heparin 1,400 Units/hr (06/07/14 0815)  . propofol Stopped (06/07/14 0831)    Past Medical History  Diagnosis Date  . Cerebrovascular disease 2009    TIA; 2009- right ICA stent; re-intervention for restenosis complicated by [facility] w/o sx  . Degenerative joint disease     Total shoulder arthroplasty-right  . LBBB (left bundle branch block)     Normal echo-2011; stress nuclear in 09/2010--septal hypoperfusion representing nontransmural infarction or the effect of left bundle branch block, no ischemia  . Hyperlipidemia   . Hypertension   . Chronic obstructive pulmonary disease   . Tobacco abuse     -100 pack years; 1.5 packs per day  . Anxiety and depression   . Alcohol abuse     6 beers per day; hospital admission in 2009 for withdrawal symptoms  . Thrombocytopenia   . Tubular adenoma of colon     Past Surgical History  Procedure Laterality Date  . Lumbar fusion  2010  . Vasectomy  1971  . Colonoscopy w/ polypectomy  2011  . Total shoulder arthroplasty  2011    Right-Dr. Tamera Punt  . Colonoscopy  08/22/09    Fields-(Tubular Adenoma)3-mm transverse polyp/4-mm polyp otherwise noraml/small internal hemorrhoids  . Esophagogastroduodenoscopy [name] 03/05/2014    Procedure: ESOPHAGOGASTRODUODENOSCOPY (EGD);  Surgeon: Danie Binder, MD;  Location: [facility];  Service: Endoscopy;  Laterality: [name];  11:45  . Agile capsule [name] 03/18/2014    Procedure: AGILE CAPSULE;  Surgeon: Danie Binder, MD;  Location: [facility];  Service: Endoscopy;  Laterality: [name];  7:30  . Givens capsule  study [name] 03/30/2014    Procedure: GIVENS CAPSULE STUDY;  Surgeon: Danie Binder, MD;  Location: [facility];  Service: Endoscopy;  Laterality: [name];  7:30  . Colonoscopy [name] 04/14/2014    Procedure: COLONOSCOPY;  Surgeon: Danie Binder, MD;  Location: [facility];  Service: Endoscopy;  Laterality: [name];  9:30    Inda Coke MS, RD, LDN Inpatient Registered Dietitian Pager: [phone] After-hours pager: [phone]

## 2014-06-07 NOTE — Progress Notes (Addendum)
PULMONARY / CRITICAL CARE MEDICINE   Name: Roy [name] MRN: [id number] DOB: 12-28-1945    ADMISSION DATE:  06/03/2014 CONSULTATION DATE:  06/03/2014  REFERRING MD :  Lacinda Axon ([facility]) PRIMARY SERVICE: PCCM  CHIEF COMPLAINT:  SOB, Respiratory Distress  BRIEF PATIENT DESCRIPTION: Patient is a 68 year old male, current smoker (120 pack years) with PMH of COPD, alcohol abuse, chronic bronchitis, TIA, HTN, LBBB who presented to [facility] ED via EMS 7/9 for progressively worsening SOB and respiratory distress. Per EMS, sats in 70s on arrival to pt's home. Patient was started on CPAP and transitioned to BiPAP in ED. Concern ischemia, MI  LINES / TUBES: PIV  CULTURES: 7/9 BC x 2 >>> 7/9 UC >>> 7/9 MRSA by PCR >>>  ANTIBIOTICS: Azithromycin 7/9 >>>7/13 Ceftriaxone 7/9 >>>  SIGNIFICANT EVENTS / STUDIES:  7/09 - Presented to [facility] ED w/ SOB 7/09 - EKG >>> no acute MI, troponin of 1.46 7/09 - Tx to [facility] 7/10- requires high flow O2, ETOH WD concern 7/10 - TTE > LVEF 20%, physiology suggestive of restriction, LAE 7/12 ett emergent 7/13- lasix to  Neg balance successful  SUBJECTIVE/OVERNIGHT/INTERVAL HX Neg balance  VITAL SIGNS: Temp:  [97 F (36.1 C)-100.7 F (38.2 C)] 98.6 F (37 C) (07/13 0700) Pulse Rate:  [61-105] 84 (07/13 0800) Resp:  [16-32] 20 (07/13 0800) BP: (94-119)/(42-80) 100/58 mmHg (07/13 0800) SpO2:  [100 %] 100 % (07/13 0800) FiO2 (%):  [30 %-40 %] 30 % (07/13 0755) Weight:  [68.2 kg (150 lb 5.7 oz)] 68.2 kg (150 lb 5.7 oz) (07/13 0500)  HEMODYNAMICS:   VENTILATOR SETTINGS: Vent Mode:  [-] PSV;CPAP FiO2 (%):  [30 %-40 %] 30 % Set Rate:  [16 bmp] 16 bmp Vt Set:  [550 mL] 550 mL PEEP:  [5 cmH20-8 cmH20] 5 cmH20 Pressure Support:  [5 cmH20] 5 cmH20 Plateau Pressure:  [13 cmH20-20 cmH20] 18 cmH20  INTAKE / OUTPUT: Intake/Output     07/12 0701 - 07/13 0700 07/13 0701 - 07/14 0700   P.O.     I.V. (mL/kg) 1129 (16.6) 74 (1.1)   NG/GT 90    IV Piggyback 300    Total  Intake(mL/kg) 1519 (22.3) 74 (1.1)   Urine (mL/kg/hr) 2095 (1.3) 250 (1.7)   Total Output 2095 250   Net -576 -176         PHYSICAL EXAMINATION:  Gen: synchrous HEENT: NCAT PULM: Crackles bases improved CV: RRR, no mgr, JVD AB: BS+, soft, nontender, no hsm Ext: warm, no edema Derm: no rash or skin breakdown Neuro:rass 0    LABS:  PULMONARY  Recent Labs Lab 06/03/14 1005 06/03/14 1440 06/04/14 0409 06/06/14 0047  PHART 7.226* 7.395 7.475* 7.456*  PCO2ART 40.3 36.0 36.0 40.8  PO2ART 89.4 53.0* 89.3 316.0*  HCO3 16.1* 22.0 26.2* 28.9*  TCO2 15.2 23 27.3 30  O2SAT 94.2 87.0 96.7 100.0    CBC  Recent Labs Lab 06/05/14 0223 06/06/14 0313 06/07/14 0239  HGB 10.8* 9.8* 9.4*  HCT 31.1* 28.8* 28.9*  WBC 11.9* 11.3* 7.7  PLT 167 168 216    COAGULATION  Recent Labs Lab 06/03/14 1500  INR 1.12    CARDIAC    Recent Labs Lab 06/03/14 1011 06/03/14 1500 06/03/14 2152 06/04/14 0322 06/07/14 0239  TROPONINI 1.46* 2.59* 4.15* 4.07* 4.84*    Recent Labs Lab 06/03/14 1011 06/07/14 0239  PROBNP 5223.0* 4105.0*     CHEMISTRY  Recent Labs Lab 06/03/14 1011 06/03/14 1500 06/04/14 0322 06/05/14 0223 06/06/14 7654 06/07/14 0239  NA 126* 126* 130* 130* 131* 138  K 3.8 4.2 4.5 3.8 3.1* 3.2*  CL 88* 87* 86* 87* 87* 94*  CO2 18* 19 26 26 25 27   GLUCOSE 181* 182* 163* 134* 135* 97  BUN 5* 7 9 16 21 13   CREATININE 0.57 0.51 0.50 0.52 0.59 0.56  CALCIUM 8.6 8.6 8.8 8.2* 8.0* 8.1*  MG  --  1.7  --  1.8  --  1.9  PHOS  --  4.1  --  4.1  --  3.0   Estimated Creatinine Clearance: 80.9 ml/min (by C-G formula based on Cr of 0.56).   LIVER  Recent Labs Lab 06/03/14 1011 06/03/14 1500  AST 47* 66*  ALT 29 27  ALKPHOS 131* 117  BILITOT 0.3 0.4  PROT 7.5 7.3  ALBUMIN 3.7 3.6  INR  --  1.12     INFECTIOUS  Recent Labs Lab 06/03/14 1500 06/04/14 0322 06/05/14 0225 06/06/14 2350  LATICACIDVEN 3.9*  --   --  0.9  PROCALCITON 0.25 0.39  0.24  --      ENDOCRINE CBG (last 3)   Recent Labs  06/07/14 0001 06/07/14 0404 06/07/14 0718  GLUCAP 106* 99 108*         IMAGING x48h  Dg Chest Port 1 View  06/07/2014   CLINICAL DATA:  Endotracheal tube.  EXAM: PORTABLE CHEST - 1 VIEW  COMPARISON:  06/05/2014.  FINDINGS: Endotracheal tube ends at the level of the mid thoracic trachea. A new orogastric tube enters the stomach at least. Interval decrease in diffuse interstitial and airspace opacities. No evidence of pneumothorax or increasing pleural fluid. Right glenohumeral arthroplasty noted.  IMPRESSION: 1. Endotracheal and orogastric tubes are in good position. 2. Decreasing pulmonary edema.   Electronically Signed   By: Jorje Guild M.D.   On: 06/07/2014 06:49   Portable Chest Xray  06/06/2014   CLINICAL DATA:  Endotracheal tube placement.  EXAM: PORTABLE CHEST - 1 VIEW  COMPARISON:  06/05/2014  FINDINGS: Interval placement of an endotracheal tube with tip measuring 4.3 cm above the carinal. Shallow inspiration. Mild cardiac enlargement with diffuse bilateral pulmonary infiltration, likely edema. Postoperative change in the right shoulder. No blunting of costophrenic angles. No pneumothorax.  IMPRESSION: Endotracheal tube tip measures 4.3 cm above the carina. Diffuse bilateral pulmonary parenchymal infiltrates again demonstrated.   Electronically Signed   By: Lucienne Capers M.D.   On: 06/06/2014 00:29   Dg Abd Portable 1v  06/06/2014   CLINICAL DATA:  OG tube placement  EXAM: PORTABLE ABDOMEN - 1 VIEW  COMPARISON:  03/19/2014; 10/09/2012; CTA - 02/17/2014  FINDINGS: Interval placement of enteric tube with tip and side port overlying the expected location of the mid body of the stomach.  There is moderate gas distention of the colon without definitive gas distention of the upstream small bowel. No supine evidence of pneumoperitoneum. No definite pneumatosis or portal venous gas.  There has been interval passage of previously  ingested endoscopy capsule.  Vascular calcifications.  IMPRESSION: 1. Enteric tube tip and side port projects over the mid body of the stomach. 2. Nonspecific mild gas distention of the colon could be indicative of mild ileus.   Electronically Signed   By: Sandi Mariscal M.D.   On: 06/06/2014 13:37       ASSESSMENT / PLAN:  PULMONARY A: Acute hypoxemic respiratory failure due to CHF complicated by COPD Treating empirically as CAP COPD but not clearly in exacerbation Current smoker, 120 pack-years pulm  edema  P: - full vent support - Supplementary O2 for sats > 92% - continue lasix to neg balance, improved pcxr with this approach - continue scheduled and prn albuterol -wean cpap 5 ps 5, goal 30 min, assess rsbi  CARDIOVASCULAR A: NSTEMI Acute decompensated systolic heart failure  P: - per cardiology - asa, hep drip- per cards stop date for this? - continue lasix > per cardiology, done very well with this - target net negative I/O  RENAL A: Mild hypomag and hypok Overload, pulm edema  P:  - replete mag and K (K repletion done by elink) - KVO IVF - Follow BMets  GASTROINTESTINAL A: Hx of tubular adenoma of colon P: - start TF - PPI  HEMATOLOGIC A: Leukocytosis Hx thrombocytopenia P:  - cbc daily - PRBC for hgb < 8gm% due to NSTEMI -once off hep drip, ad sub q   INFECTIOUS A: Unimpressed cap   P:   - dc azithro, consider dc ceftriaxone in am if no fevers  ENDOCRINE A: No acute issues  P:   - cbg  NEUROLOGIC A: Acute encephalopathy due to Alcohol withdrawal > improved - hx of hospitalization for w/d in 2009 Hx Anxiety, Depression Hx TIA, 2009   - severe delirium failed precedex and intubated 06/06/14  P:  - Ativan prn, fent prn - diprivan gtt with wua -add fent gtt if needed - Thiamine/folate   The patient is critically ill with multiple organ systems failure and requires high complexity decision making for assessment and support,  frequent evaluation and titration of therapies, application of advanced monitoring technologies and extensive interpretation of multiple databases.   Critical Care Time devoted to patient care services described in this note is  30  Minutes.  Lavon Paganini. Titus Mould, MD, [place] Pgr: [facility]  9:13 AM

## 2014-06-07 NOTE — Progress Notes (Signed)
Patient ID: Roy [name], male   DOB: 14-Sep-1946, 68 y.o.   MRN: [id number]    Subjective:  Intubated and sedated   Objective:  Filed Vitals:   06/07/14 0500 06/07/14 0600 06/07/14 0700 06/07/14 0800  BP: 102/42 114/60 94/54 100/58  Pulse: 78 85 84 84  Temp:   98.6 F (37 C)   TempSrc:   Oral   Resp: 19 24 18 20   Height:      Weight: 150 lb 5.7 oz (68.2 kg)     SpO2: 100% 100% 100% 100%    Intake/Output from previous day:  Intake/Output Summary (Last 24 hours) at 06/07/14 0833 Last data filed at 06/07/14 0800  Gross per 24 hour  Intake 1540.38 ml  Output   2345 ml  Net -804.62 ml    Physical Exam: Physical exam: Intubated  Skin is warm and dry.  HEENT is normal.  Neck is supple.  JVP is elevated to 3 cm above the sternal angle. Chest basilar crackles and diminished BS Cardiovascular exam distant heart sounds; regular rate and rhythm.  Abdominal exam nontender or distended. No masses palpated. Extremities show no edema. neuro grossly intact   Lab Results: Basic Metabolic Panel:  Recent Labs  06/05/14 0223 06/06/14 0313 06/07/14 0239  NA 130* 131* 138  K 3.8 3.1* 3.2*  CL 87* 87* 94*  CO2 26 25 27   GLUCOSE 134* 135* 97  BUN 16 21 13   CREATININE 0.52 0.59 0.56  CALCIUM 8.2* 8.0* 8.1*  MG 1.8  --  1.9  PHOS 4.1  --  3.0   CBC:  Recent Labs  06/06/14 0313 06/07/14 0239  WBC 11.3* 7.7  NEUTROABS 9.5* 5.2  HGB 9.8* 9.4*  HCT 28.8* 28.9*  MCV 84.0 86.5  PLT 168 216   Cardiac Enzymes:  Recent Labs  06/07/14 0239  TROPONINI 4.84*   . antiseptic oral rinse  15 mL Mouth Rinse QID  . aspirin  81 mg Oral Daily  . atorvastatin  80 mg Oral q1800  . azithromycin  500 mg Intravenous Q24H  . cefTRIAXone (ROCEPHIN)  IV  1 g Intravenous Q24H  . chlorhexidine  15 mL Mouth Rinse BID  . folic acid  1 mg Intravenous Daily  . insulin aspart  0-15 Units Subcutaneous 6 times per day  . ipratropium-albuterol  3 mL Nebulization Q4H  . metoprolol tartrate   12.5 mg Oral BID  . pantoprazole (PROTONIX) IV  40 mg Intravenous Daily  . thiamine IV  100 mg Intravenous Daily    . sodium chloride 10 mL/hr at 06/05/14 2000  . furosemide (LASIX) infusion 4 mg/hr (06/07/14 0800)  . heparin 1,300 Units/hr (06/07/14 0800)  . propofol Stopped (06/07/14 0831)    Assessment/Plan:  1 non-ST elevation myocardial infarction-patient has ruled in. Troponin elevated to 4.15 and trending down. Continue aspirin, heparin, statin and low-dose metoprolol. He will ultimately need cardiac catheterization. 2 alcohol abuse-BZ as needed  3 tobacco abuse- makes weaning more difficutl . 4 hyperlipidemia-on statin. 5 hypertension- metoprolol 12.5 mg by mouth twice a day added. Follow blood pressure and adjust medications as needed. 6 acute diastolic congestive heart failure-   CXR with decreasing edema today continue diuresis 7 COPD-management per critical care medicine. 8 history of pulmonary nodules 9. New cardiomyopathy, presumably ischemic - Echo shows LVEF of 20%, with global hypokinesis. On lasix gtts.    Jenkins Rouge 06/07/2014, 8:33 AM

## 2014-06-07 NOTE — Progress Notes (Signed)
[facility] ADULT ICU REPLACEMENT PROTOCOL FOR AM LAB REPLACEMENT ONLY  The patient does apply for the [facility] Adult ICU Electrolyte Replacment Protocol based on the criteria listed below:   1. Is GFR >/= 40 ml/min? Yes.    Patient's GFR today is >90 2. Is urine output >/= 0.5 ml/kg/hr for the last 6 hours? Yes.   Patient's UOP is 1.0 ml/kg/hr 3. Is BUN < 60 mg/dL? Yes.    Patient's BUN today is 13 4. Abnormal electrolyte(s): K+3.2 5. Ordered repletion with: protocol 6. If a panic level lab has been reported, has the CCM MD in charge been notified? Yes.  .   Physician:  Marni Griffon 06/07/2014 5:13 AM

## 2014-06-08 ENCOUNTER — Inpatient Hospital Stay (HOSPITAL_COMMUNITY): Payer: Medicare Other

## 2014-06-08 DIAGNOSIS — J449 Chronic obstructive pulmonary disease, unspecified: Secondary | ICD-10-CM

## 2014-06-08 DIAGNOSIS — K7030 Alcoholic cirrhosis of liver without ascites: Secondary | ICD-10-CM

## 2014-06-08 LAB — BASIC METABOLIC PANEL
ANION GAP: 17 — AB (ref 5–15)
ANION GAP: 18 — AB (ref 5–15)
ANION GAP: 20 — AB (ref 5–15)
BUN: 14 mg/dL (ref 6–23)
BUN: 15 mg/dL (ref 6–23)
BUN: 26 mg/dL — ABNORMAL HIGH (ref 6–23)
CALCIUM: 8.8 mg/dL (ref 8.4–10.5)
CALCIUM: 9.9 mg/dL (ref 8.4–10.5)
CO2: 27 meq/L (ref 19–32)
CO2: 28 mEq/L (ref 19–32)
CO2: 29 mEq/L (ref 19–32)
Calcium: 9 mg/dL (ref 8.4–10.5)
Chloride: 94 mEq/L — ABNORMAL LOW (ref 96–112)
Chloride: 94 mEq/L — ABNORMAL LOW (ref 96–112)
Chloride: 92 mEq/L — ABNORMAL LOW (ref 96–112)
Creatinine, Ser: 0.5 mg/dL (ref 0.50–1.35)
Creatinine, Ser: 0.43 mg/dL — ABNORMAL LOW (ref 0.50–1.35)
Creatinine, Ser: 0.63 mg/dL (ref 0.50–1.35)
GFR calc Af Amer: >90 mL/min (ref >90)
GFR calc Af Amer: >90 mL/min (ref >90)
GFR calc Af Amer: >90 mL/min (ref >90)
GFR calc non Af Amer: >90 mL/min (ref >90)
Glucose, Bld: 126 mg/dL — ABNORMAL HIGH (ref 70–99)
Glucose, Bld: 118 mg/dL — ABNORMAL HIGH (ref 70–99)
Glucose, Bld: 153 mg/dL — ABNORMAL HIGH (ref 70–99)
POTASSIUM: 4.1 meq/L (ref 3.7–5.3)
Potassium: 3.7 mEq/L (ref 3.7–5.3)
Potassium: 4.2 mEq/L (ref 3.7–5.3)
SODIUM: 138 meq/L (ref 137–147)
SODIUM: 140 meq/L (ref 137–147)
SODIUM: 141 meq/L (ref 137–147)

## 2014-06-08 LAB — HEPARIN LEVEL (UNFRACTIONATED)
Heparin Unfractionated: <0.1 IU/mL — ABNORMAL LOW (ref 0.30–0.70)
Heparin Unfractionated: 0.51 IU/mL (ref 0.30–0.70)

## 2014-06-08 LAB — GLUCOSE, CAPILLARY
GLUCOSE-CAPILLARY: 123 mg/dL — AB (ref 70–99)
GLUCOSE-CAPILLARY: 168 mg/dL — AB (ref 70–99)
Glucose-Capillary: 120 mg/dL — ABNORMAL HIGH (ref 70–99)
Glucose-Capillary: 140 mg/dL — ABNORMAL HIGH (ref 70–99)
Glucose-Capillary: 159 mg/dL — ABNORMAL HIGH (ref 70–99)
Glucose-Capillary: 147 mg/dL — ABNORMAL HIGH (ref 70–99)

## 2014-06-08 LAB — CBC WITH DIFFERENTIAL/PLATELET
Basophils Absolute: 0 10*3/uL (ref 0.0–0.1)
Basophils Relative: 0 % (ref 0–1)
Eosinophils Absolute: 0.2 10*3/uL (ref 0.0–0.7)
Eosinophils Relative: 2 % (ref 0–5)
HEMATOCRIT: 32.5 % — AB (ref 39.0–52.0)
Hemoglobin: 10.9 g/dL — ABNORMAL LOW (ref 13.0–17.0)
LYMPHS PCT: 12 % (ref 12–46)
Lymphs Abs: 1.4 10*3/uL (ref 0.7–4.0)
MCH: 29.1 pg (ref 26.0–34.0)
MCHC: 33.5 g/dL (ref 30.0–36.0)
MCV: 86.9 fL (ref 78.0–100.0)
MONO ABS: 1.3 10*3/uL — AB (ref 0.1–1.0)
Monocytes Relative: 12 % (ref 3–12)
NEUTROS ABS: 8.8 10*3/uL — AB (ref 1.7–7.7)
Neutrophils Relative %: 75 % (ref 43–77)
Platelets: 313 10*3/uL (ref 150–400)
RBC: 3.74 MIL/uL — AB (ref 4.22–5.81)
RDW: 15 % (ref 11.5–15.5)
WBC: 11.7 10*3/uL — AB (ref 4.0–10.5)

## 2014-06-08 LAB — PHOSPHORUS: Phosphorus: 5.5 mg/dL — ABNORMAL HIGH (ref 2.3–4.6)

## 2014-06-08 LAB — TRIGLYCERIDES: TRIGLYCERIDES: 77 mg/dL (ref <150)

## 2014-06-08 LAB — MAGNESIUM: Magnesium: 1.9 mg/dL (ref 1.5–2.5)

## 2014-06-08 MED ORDER — PANTOPRAZOLE SODIUM 40 MG PO PACK
40.0000 mg | PACK | Freq: Every day | ORAL | Status: DC
  Administered 2014-06-08 – 2014-06-12 (×4): 40 mg
  Filled 2014-06-08 (×5): qty 20

## 2014-06-08 MED ORDER — VITAMIN B-1 100 MG PO TABS
100.0000 mg | ORAL_TABLET | Freq: Every day | ORAL | Status: DC
  Administered 2014-06-08 – 2014-06-22 (×15): 100 mg via ORAL
  Filled 2014-06-08 (×15): qty 1

## 2014-06-08 MED ORDER — POTASSIUM CHLORIDE 20 MEQ/15ML (10%) PO LIQD
40.0000 meq | Freq: Three times a day (TID) | ORAL | Status: AC
  Administered 2014-06-08 (×2): 40 meq
  Filled 2014-06-08 (×2): qty 30

## 2014-06-08 MED ORDER — FUROSEMIDE 10 MG/ML IJ SOLN
10.0000 mg/h | INTRAVENOUS | Status: DC
  Filled 2014-06-08 (×3): qty 25

## 2014-06-08 MED ORDER — METOLAZONE 5 MG PO TABS
5.0000 mg | ORAL_TABLET | Freq: Every day | ORAL | Status: AC
  Administered 2014-06-08: 5 mg via ORAL
  Filled 2014-06-08: qty 1

## 2014-06-08 MED ORDER — FOLIC ACID 1 MG PO TABS
1.0000 mg | ORAL_TABLET | Freq: Every day | ORAL | Status: DC
  Administered 2014-06-08 – 2014-06-22 (×15): 1 mg via ORAL
  Filled 2014-06-08 (×15): qty 1

## 2014-06-08 MED ORDER — ALBUMIN HUMAN 25 % IV SOLN
12.5000 g | Freq: Four times a day (QID) | INTRAVENOUS | Status: AC
  Administered 2014-06-08 – 2014-06-09 (×3): 12.5 g via INTRAVENOUS
  Filled 2014-06-08 (×4): qty 50

## 2014-06-08 NOTE — Progress Notes (Signed)
ANTICOAGULATION CONSULT NOTE - Follow Up Consult  Pharmacy Consult for Heparin Indication: NSTEMI  No Known Allergies  Patient Measurements: Height: 5\' 6"  (167.6 cm) Weight: 145 lb 8.1 oz (66 kg) IBW/kg (Calculated) : 63.8 Heparin Dosing Weight: 68 kg  Vital Signs: Temp: 100 F (37.8 C) (07/14 1135) Temp src: Oral (07/14 1135) BP: 105/61 mmHg (07/14 1200) Pulse Rate: 97 (07/14 1200)  Labs:  Recent Labs  06/06/14 0313 06/07/14 0239  06/07/14 2113 06/08/14 0331 06/08/14 0914 06/08/14 1135  HGB 9.8* 9.4*  --   --  10.9*  --   --   HCT 28.8* 28.9*  --   --  32.5*  --   --   PLT 168 216  --   --  313  --   --   HEPARINUNFRC 0.70 0.25*  --   --  <0.10*  --  0.51  CREATININE 0.59 0.56  < > 0.50 0.50 0.43*  --   TROPONINI  --  4.84*  --   --   --   --   --   < > = values in this interval not displayed.  Estimated Creatinine Clearance: 80.9 ml/min (by C-G formula based on Cr of 0.43).  Assessment: 68 yo male with NSTEMI on heparin and heparin level at goal (HL= 0.51). Heparin low this am and this was due to an occluded IV site that has been changed. Planning cardiac cath after extubation.  Goal of Therapy:  Heparin level 0.3-0.7 units/ml Monitor platelets by anticoagulation protocol: Yes   Plan:  -No heparin changes needed -Heparin level in am (patient has been at goal previously on 1400 units/hr)  Hildred Laser, Pharm D 06/08/2014 1:19 PM

## 2014-06-08 NOTE — Progress Notes (Signed)
PULMONARY / CRITICAL CARE MEDICINE   Name: Roy [name] MRN: [id number] DOB: [birth year]    ADMISSION DATE:  06/03/2014 CONSULTATION DATE:  06/03/2014  REFERRING MD :  Lacinda Axon ([facility]) PRIMARY SERVICE: PCCM  CHIEF COMPLAINT:  SOB, Respiratory Distress  BRIEF PATIENT DESCRIPTION: Patient is a 68 year old male, current smoker (120 pack years) with PMH of COPD, alcohol abuse, chronic bronchitis, TIA, HTN, LBBB who presented to [facility] ED via EMS 7/9 for progressively worsening SOB and respiratory distress. Per EMS, sats in 70s on arrival to pt's home. Patient was started on CPAP and transitioned to BiPAP in ED. Concern ischemia, MI  LINES / TUBES: PIV  CULTURES: 7/9 BC x 2 >>> 7/9 UC >>> 7/9 MRSA by PCR >>>  ANTIBIOTICS: Azithromycin 7/9 >>>7/13 Ceftriaxone 7/9 >>>  SIGNIFICANT EVENTS / STUDIES:  7/09 - Presented to [facility] ED w/ SOB 7/09 - EKG >>> no acute MI, troponin of 1.46 7/09 - Tx to [facility] 7/10- requires high flow O2, ETOH WD concern 7/10 - TTE > LVEF 20%, physiology suggestive of restriction, LAE 7/12 ett emergent 7/13- lasix to  Neg balance successful  SUBJECTIVE/OVERNIGHT/INTERVAL HX Did not tolerate weaning today  VITAL SIGNS: Temp:  [98.4 F (36.9 C)-99.7 F (37.6 C)] 99.6 F (37.6 C) (07/14 0819) Pulse Rate:  [67-132] 102 (07/14 1000) Resp:  [16-38] 22 (07/14 1000) BP: (90-144)/(47-95) 106/65 mmHg (07/14 1000) SpO2:  [91 %-100 %] 100 % (07/14 1000) FiO2 (%):  [30 %] 30 % (07/14 0853) Weight:  [145 lb 8.1 oz (66 kg)] 145 lb 8.1 oz (66 kg) (07/14 0420)  HEMODYNAMICS:   VENTILATOR SETTINGS: Vent Mode:  [-] PRVC FiO2 (%):  [30 %] 30 % Set Rate:  [16 bmp] 16 bmp Vt Set:  [550 mL] 550 mL PEEP:  [5 cmH20] 5 cmH20 Pressure Support:  [5 cmH20] 5 cmH20 Plateau Pressure:  [16 cmH20] 16 cmH20  INTAKE / OUTPUT: Intake/Output     07/13 0701 - 07/14 0700 07/14 0701 - 07/15 0700   I.V. (mL/kg) 1124.8 (17) 84 (1.3)   Other 6    NG/GT 530 180   IV Piggyback 50    Total  Intake(mL/kg) 1710.8 (25.9) 264 (4)   Urine (mL/kg/hr) 2235 (1.4) 325 (1.1)   Total Output 2235 325   Net -524.2 -61         PHYSICAL EXAMINATION:  Gen: synchrous HEENT: NCAT PULM: Crackles bases improved CV: RRR, no mgr, JVD AB: BS+, soft, nontender, no hsm Ext: warm, no edema Derm: no rash or skin breakdown Neuro:rass 0   LABS:  PULMONARY  Recent Labs Lab 06/03/14 1005 06/03/14 1440 06/04/14 0409 06/06/14 0047  PHART 7.226* 7.395 7.475* 7.456*  PCO2ART 40.3 36.0 36.0 40.8  PO2ART 89.4 53.0* 89.3 316.0*  HCO3 16.1* 22.0 26.2* 28.9*  TCO2 15.2 23 27.3 30  O2SAT 94.2 87.0 96.7 100.0   CBC  Recent Labs Lab 06/06/14 0313 06/07/14 0239 06/08/14 0331  HGB 9.8* 9.4* 10.9*  HCT 28.8* 28.9* 32.5*  WBC 11.3* 7.7 11.7*  PLT 168 216 313   COAGULATION  Recent Labs Lab 06/03/14 1500  INR 1.12   CARDIAC   Recent Labs Lab 06/03/14 1011 06/03/14 1500 06/03/14 2152 06/04/14 0322 06/07/14 0239  TROPONINI 1.46* 2.59* 4.15* 4.07* 4.84*   Recent Labs Lab 06/03/14 1011 06/07/14 0239  PROBNP 5223.0* 4105.0*   CHEMISTRY  Recent Labs Lab 06/03/14 1011 06/03/14 1500  06/05/14 0223  06/07/14 0239 06/07/14 0950 06/07/14 2113 06/08/14 0331 06/08/14 0914  NA  126* 126*  < > 130*  < > 138 137 136* 138 140  K 3.8 4.2  < > 3.8  < > 3.2* 4.5 4.0 4.1 3.7  CL 88* 87*  < > 87*  < > 94* 96 93* 94* 94*  CO2 18* 19  < > 26  < > 27 26 27 27 28   GLUCOSE 181* 182*  < > 134*  < > 97 110* 118* 126* 118*  BUN 5* 7  < > 16  < > 13 13 12 14 15   CREATININE 0.57 0.51  < > 0.52  < > 0.56 0.50 0.50 0.50 0.43*  CALCIUM 8.6 8.6  < > 8.2*  < > 8.1* 8.3* 8.7 8.8 9.0  MG  --  1.7  --  1.8  --  1.9  --   --  1.9  --   PHOS  --  4.1  --  4.1  --  3.0  --   --  5.5*  --   < > = values in this interval not displayed. Estimated Creatinine Clearance: 80.9 ml/min (by C-G formula based on Cr of 0.43).  LIVER  Recent Labs Lab 06/03/14 1011 06/03/14 1500  AST 47* 66*  ALT 29 27   ALKPHOS 131* 117  BILITOT 0.3 0.4  PROT 7.5 7.3  ALBUMIN 3.7 3.6  INR  --  1.12   INFECTIOUS  Recent Labs Lab 06/03/14 1500 06/04/14 0322 06/05/14 0225 06/06/14 2350  LATICACIDVEN 3.9*  --   --  0.9  PROCALCITON 0.25 0.39 0.24  --    ENDOCRINE CBG (last 3)   Recent Labs  06/08/14 0330 06/08/14 0724 06/08/14 1120  GLUCAP 120* 140* 159*   IMAGING x48h  Dg Chest Port 1 View  06/08/2014   CLINICAL DATA:  COPD.  Respiratory failure.  EXAM: PORTABLE CHEST - 1 VIEW  COMPARISON:  Single view of the chest 06/07/2014 and 06/05/2014.  FINDINGS: Support tubes and lines are unchanged. No pneumothorax is identified. Interstitial edema seen on yesterday's examination continues to improve. There is some new subsegmental atelectasis in the left lung base. Heart size is normal.  IMPRESSION: Continued improvement in interstitial pulmonary edema.  Increased subsegmental atelectasis left lung base.   Electronically Signed   By: Inge Rise M.D.   On: 06/08/2014 08:08   Dg Chest Port 1 View  06/07/2014   CLINICAL DATA:  Endotracheal tube.  EXAM: PORTABLE CHEST - 1 VIEW  COMPARISON:  06/05/2014.  FINDINGS: Endotracheal tube ends at the level of the mid thoracic trachea. A new orogastric tube enters the stomach at least. Interval decrease in diffuse interstitial and airspace opacities. No evidence of pneumothorax or increasing pleural fluid. Right glenohumeral arthroplasty noted.  IMPRESSION: 1. Endotracheal and orogastric tubes are in good position. 2. Decreasing pulmonary edema.   Electronically Signed   By: Jorje Guild M.D.   On: 06/07/2014 06:49   ASSESSMENT / PLAN:  PULMONARY A: Acute hypoxemic respiratory failure due to CHF complicated by COPD Treating empirically as CAP COPD but not clearly in exacerbation Current smoker, 120 pack-years pulm edema  P: - Begin PS trials today - Supplementary O2 for sats > 92% - Continue lasix to neg balance, improved pcxr with this  approach - Continue scheduled and prn albuterol  CARDIOVASCULAR A: NSTEMI Acute decompensated systolic heart failure  P: - Per cardiology - ASA, hep drip- per cards stop date for this? - Continue lasix > per cardiology, done very well with this -  Target net negative I/O  RENAL A: Mild hypomag and hypok Overload, pulm edema  P:  - Replete electrolytes as needed. - KVO IVF. - Follow BMets - Lasix drip.  GASTROINTESTINAL A: Hx of tubular adenoma of colon P: - Continue TF - PPI  HEMATOLOGIC A: Leukocytosis Hx thrombocytopenia P:  - CBC daily. - PRBC for hgb < 8gm% due to NSTEMI. - Continue heparin drip.  INFECTIOUS A: Unimpressed cap   P:   - D/C azithro - Continue rocephin for now  ENDOCRINE A: No acute issues  P:   - CBG and ISS.  NEUROLOGIC A: Acute encephalopathy due to Alcohol withdrawal > improved - hx of hospitalization for w/d in 2009 Hx Anxiety, Depression Hx TIA, 2009  - severe delirium failed precedex and intubated 06/06/14  P:  - Ativan prn, fent prn. - Diprivan gtt with wua, if unable to extubate in AM will d/c propofol for versed. - Add fent gtt if needed. - Thiamine/folate.   The patient is critically ill with multiple organ systems failure and requires high complexity decision making for assessment and support, frequent evaluation and titration of therapies, application of advanced monitoring technologies and extensive interpretation of multiple databases.   Critical Care Time devoted to patient care services described in this note is 35 Minutes.  Rush Farmer, M.D. [facility] Pulmonary/Critical Care Medicine. Pager: [phone]. After hours pager: [phone].   11:33 AM

## 2014-06-09 ENCOUNTER — Inpatient Hospital Stay (HOSPITAL_COMMUNITY): Payer: Medicare Other

## 2014-06-09 LAB — BASIC METABOLIC PANEL
ANION GAP: 22 — AB (ref 5–15)
ANION GAP: 22 — AB (ref 5–15)
Anion gap: 21 — ABNORMAL HIGH (ref 5–15)
BUN: 32 mg/dL — ABNORMAL HIGH (ref 6–23)
BUN: 41 mg/dL — ABNORMAL HIGH (ref 6–23)
BUN: 60 mg/dL — ABNORMAL HIGH (ref 6–23)
CALCIUM: 10 mg/dL (ref 8.4–10.5)
CALCIUM: 10.5 mg/dL (ref 8.4–10.5)
CHLORIDE: 90 meq/L — AB (ref 96–112)
CHLORIDE: 93 meq/L — AB (ref 96–112)
CO2: 29 mEq/L (ref 19–32)
CO2: 32 meq/L (ref 19–32)
CO2: 29 mEq/L (ref 19–32)
Calcium: 9.9 mg/dL (ref 8.4–10.5)
Chloride: 90 mEq/L — ABNORMAL LOW (ref 96–112)
Creatinine, Ser: 0.67 mg/dL (ref 0.50–1.35)
Creatinine, Ser: 0.73 mg/dL (ref 0.50–1.35)
Creatinine, Ser: 0.98 mg/dL (ref 0.50–1.35)
GFR calc Af Amer: >90 mL/min (ref >90)
GFR calc Af Amer: >90 mL/min (ref >90)
GFR calc non Af Amer: >90 mL/min (ref >90)
GFR calc non Af Amer: 83 mL/min — ABNORMAL LOW (ref >90)
GLUCOSE: 157 mg/dL — AB (ref 70–99)
GLUCOSE: 166 mg/dL — AB (ref 70–99)
Glucose, Bld: 168 mg/dL — ABNORMAL HIGH (ref 70–99)
Potassium: 3.8 mEq/L (ref 3.7–5.3)
Potassium: 3.5 mEq/L — ABNORMAL LOW (ref 3.7–5.3)
Potassium: 4.5 mEq/L (ref 3.7–5.3)
SODIUM: 140 meq/L (ref 137–147)
SODIUM: 144 meq/L (ref 137–147)
SODIUM: 144 meq/L (ref 137–147)

## 2014-06-09 LAB — BLOOD GAS, ARTERIAL
Acid-Base Excess: 6.8 mmol/L — ABNORMAL HIGH (ref 0.0–2.0)
Bicarbonate: 30.3 mEq/L — ABNORMAL HIGH (ref 20.0–24.0)
DRAWN BY: 252031
FIO2: 0.3 %
O2 SAT: 96.4 %
PEEP: 5 cmH2O
Patient temperature: 98.6
RATE: 16 resp/min
TCO2: 31.5 mmol/L (ref 0–100)
VT: 550 mL
pCO2 arterial: 39 mmHg (ref 35.0–45.0)
pH, Arterial: 7.502 — ABNORMAL HIGH (ref 7.350–7.450)
pO2, Arterial: 87 mmHg (ref 80.0–100.0)

## 2014-06-09 LAB — PHOSPHORUS: Phosphorus: 7.9 mg/dL — ABNORMAL HIGH (ref 2.3–4.6)

## 2014-06-09 LAB — CULTURE, BLOOD (ROUTINE X 2)
Culture: NO GROWTH
Culture: NO GROWTH

## 2014-06-09 LAB — CBC WITH DIFFERENTIAL/PLATELET
BASOS ABS: 0 10*3/uL (ref 0.0–0.1)
Basophils Relative: 0 % (ref 0–1)
Eosinophils Absolute: 0.3 10*3/uL (ref 0.0–0.7)
Eosinophils Relative: 2 % (ref 0–5)
HCT: 34.1 % — ABNORMAL LOW (ref 39.0–52.0)
Hemoglobin: 11.4 g/dL — ABNORMAL LOW (ref 13.0–17.0)
LYMPHS ABS: 1.1 10*3/uL (ref 0.7–4.0)
LYMPHS PCT: 8 % — AB (ref 12–46)
MCH: 28.6 pg (ref 26.0–34.0)
MCHC: 33.4 g/dL (ref 30.0–36.0)
MCV: 85.7 fL (ref 78.0–100.0)
Monocytes Absolute: 1.7 10*3/uL — ABNORMAL HIGH (ref 0.1–1.0)
Monocytes Relative: 13 % — ABNORMAL HIGH (ref 3–12)
NEUTROS PCT: 77 % (ref 43–77)
Neutro Abs: 10.3 10*3/uL — ABNORMAL HIGH (ref 1.7–7.7)
PLATELETS: 436 10*3/uL — AB (ref 150–400)
RBC: 3.98 MIL/uL — ABNORMAL LOW (ref 4.22–5.81)
RDW: 14.9 % (ref 11.5–15.5)
WBC: 13.3 10*3/uL — AB (ref 4.0–10.5)

## 2014-06-09 LAB — GLUCOSE, CAPILLARY
GLUCOSE-CAPILLARY: 145 mg/dL — AB (ref 70–99)
GLUCOSE-CAPILLARY: 150 mg/dL — AB (ref 70–99)
GLUCOSE-CAPILLARY: 184 mg/dL — AB (ref 70–99)
GLUCOSE-CAPILLARY: 195 mg/dL — AB (ref 70–99)
GLUCOSE-CAPILLARY: 219 mg/dL — AB (ref 70–99)
Glucose-Capillary: 185 mg/dL — ABNORMAL HIGH (ref 70–99)

## 2014-06-09 LAB — HEPARIN LEVEL (UNFRACTIONATED): HEPARIN UNFRACTIONATED: 0.46 [IU]/mL (ref 0.30–0.70)

## 2014-06-09 LAB — MAGNESIUM: MAGNESIUM: 1.8 mg/dL (ref 1.5–2.5)

## 2014-06-09 MED ORDER — POTASSIUM CHLORIDE 20 MEQ/15ML (10%) PO LIQD
ORAL | Status: AC
Start: 2014-06-09 — End: 2014-06-09
  Administered 2014-06-09: 40 meq
  Filled 2014-06-09: qty 30

## 2014-06-09 MED ORDER — ACETAZOLAMIDE SODIUM 500 MG IJ SOLR
250.0000 mg | Freq: Three times a day (TID) | INTRAMUSCULAR | Status: AC
  Administered 2014-06-09 (×2): 250 mg via INTRAVENOUS
  Filled 2014-06-09 (×2): qty 500

## 2014-06-09 MED ORDER — POTASSIUM CHLORIDE 20 MEQ/15ML (10%) PO LIQD
40.0000 meq | Freq: Three times a day (TID) | ORAL | Status: AC
  Administered 2014-06-09 (×2): 40 meq
  Filled 2014-06-09 (×3): qty 30

## 2014-06-09 MED ORDER — DEXTROSE 5 % IV SOLN
15.0000 mg/h | INTRAVENOUS | Status: AC
  Administered 2014-06-09: 10 mg/h via INTRAVENOUS
  Administered 2014-06-10: 15 mg/h via INTRAVENOUS
  Filled 2014-06-09 (×2): qty 25

## 2014-06-09 MED ORDER — POTASSIUM CHLORIDE 20 MEQ/15ML (10%) PO LIQD
ORAL | Status: AC
  Administered 2014-06-09: 40 meq
  Filled 2014-06-09: qty 30

## 2014-06-09 MED ORDER — CLOPIDOGREL BISULFATE 75 MG PO TABS
75.0000 mg | ORAL_TABLET | Freq: Every day | ORAL | Status: DC
  Administered 2014-06-09 – 2014-06-22 (×14): 75 mg via ORAL
  Filled 2014-06-09 (×14): qty 1

## 2014-06-09 MED ORDER — METOLAZONE 5 MG PO TABS
5.0000 mg | ORAL_TABLET | Freq: Once | ORAL | Status: AC
  Administered 2014-06-09: 5 mg via ORAL
  Filled 2014-06-09: qty 1

## 2014-06-09 MED ORDER — METOPROLOL TARTRATE 25 MG PO TABS
25.0000 mg | ORAL_TABLET | Freq: Three times a day (TID) | ORAL | Status: DC
  Administered 2014-06-09 – 2014-06-19 (×27): 25 mg via ORAL
  Filled 2014-06-09 (×36): qty 1

## 2014-06-09 NOTE — Progress Notes (Signed)
PULMONARY / CRITICAL CARE MEDICINE   Name: Roy [name] MRN: [id number] DOB: 1945-12-20    ADMISSION DATE:  06/03/2014 CONSULTATION DATE:  06/03/2014  REFERRING MD :  Lacinda Axon ([facility]) PRIMARY SERVICE: PCCM  CHIEF COMPLAINT:  SOB, Respiratory Distress  BRIEF PATIENT DESCRIPTION: Patient is a 68 year old male, current smoker (120 pack years) with PMH of COPD, alcohol abuse, chronic bronchitis, TIA, HTN, LBBB who presented to [facility] ED via EMS 7/9 for progressively worsening SOB and respiratory distress. Per EMS, sats in 70s on arrival to pt's home. Patient was started on CPAP and transitioned to BiPAP in ED. Concern ischemia, MI  LINES / TUBES: ETT 7/10>>> R IJ TLC 7/10>>>  CULTURES: 7/9 BC x 2 >>> 7/9 UC >>> 7/9 MRSA by PCR >>>  ANTIBIOTICS: Azithromycin 7/9 >>>7/13 Ceftriaxone 7/9 >>>  SIGNIFICANT EVENTS / STUDIES:  7/09 - Presented to [facility] ED w/ SOB 7/09 - EKG >>> no acute MI, troponin of 1.46 7/09 - Tx to [facility] 7/10- requires high flow O2, ETOH WD concern 7/10 - TTE > LVEF 20%, physiology suggestive of restriction, LAE 7/12 ett emergent 7/13- lasix to  Neg balance successful  SUBJECTIVE/OVERNIGHT/INTERVAL HX Marginal tolerance of weaning this AM.  VITAL SIGNS: Temp:  [97.8 F (36.6 C)-100 F (37.8 C)] 99.4 F (37.4 C) (07/15 0800) Pulse Rate:  [92-110] 99 (07/15 1000) Resp:  [19-28] 25 (07/15 1000) BP: (105-159)/(61-104) 159/104 mmHg (07/15 1000) SpO2:  [97 %-100 %] 98 % (07/15 1000) FiO2 (%):  [30 %] 30 % (07/15 1000) Weight:  [142 lb 6.7 oz (64.6 kg)] 142 lb 6.7 oz (64.6 kg) (07/15 0500)  HEMODYNAMICS:   VENTILATOR SETTINGS: Vent Mode:  [-] PSV FiO2 (%):  [30 %] 30 % Set Rate:  [16 bmp] 16 bmp Vt Set:  [550 mL] 550 mL PEEP:  [5 cmH20] 5 cmH20 Plateau Pressure:  [15 cmH20-25 cmH20] 17 cmH20  INTAKE / OUTPUT: Intake/Output     07/14 0701 - 07/15 0700 07/15 0701 - 07/16 0700   I.V. (mL/kg) 1006 (15.6) 142.3 (2.2)   Other     NG/GT 1575 320   IV Piggyback 200     Total Intake(mL/kg) 2781 (43.1) 462.3 (7.2)   Urine (mL/kg/hr) 2975 (1.9) 875 (3.3)   Total Output 2975 875   Net -194 -412.7        Stool Occurrence 1 x     PHYSICAL EXAMINATION:  Gen: synchrous, awake, interactive and noding appropriately to questions. HEENT: [place]/AT PULM: Crackles bases improved CV: RRR, no mgr, JVD AB: BS+, soft, nontender, no hsm Ext: warm, no edema Derm: no rash or skin breakdown Neuro:rass 1  LABS:  PULMONARY  Recent Labs Lab 06/03/14 1005 06/03/14 1440 06/04/14 0409 06/06/14 0047 06/09/14 0500  PHART 7.226* 7.395 7.475* 7.456* 7.502*  PCO2ART 40.3 36.0 36.0 40.8 39.0  PO2ART 89.4 53.0* 89.3 316.0* 87.0  HCO3 16.1* 22.0 26.2* 28.9* 30.3*  TCO2 15.2 23 27.3 30 31.5  O2SAT 94.2 87.0 96.7 100.0 96.4   CBC  Recent Labs Lab 06/07/14 0239 06/08/14 0331 06/09/14 0255  HGB 9.4* 10.9* 11.4*  HCT 28.9* 32.5* 34.1*  WBC 7.7 11.7* 13.3*  PLT 216 313 436*   COAGULATION  Recent Labs Lab 06/03/14 1500  INR 1.12   CARDIAC    Recent Labs Lab 06/03/14 1011 06/03/14 1500 06/03/14 2152 06/04/14 0322 06/07/14 0239  TROPONINI 1.46* 2.59* 4.15* 4.07* 4.84*    Recent Labs Lab 06/03/14 1011 06/07/14 0239  PROBNP 5223.0* 4105.0*   CHEMISTRY  Recent Labs Lab 06/03/14 1500  06/05/14 0223  06/07/14 0239  06/08/14 0331 06/08/14 0914 06/08/14 2238 06/09/14 0255 06/09/14 0926  NA 126*  < > 130*  < > 138  < > 138 140 141 140 144  K 4.2  < > 3.8  < > 3.2*  < > 4.1 3.7 4.2 3.8 3.5*  CL 87*  < > 87*  < > 94*  < > 94* 94* 92* 90* 90*  CO2 19  < > 26  < > 27  < > 27 28 29 29  32  GLUCOSE 182*  < > 134*  < > 97  < > 126* 118* 153* 168* 157*  BUN 7  < > 16  < > 13  < > 14 15 26* 32* 41*  CREATININE 0.51  < > 0.52  < > 0.56  < > 0.50 0.43* 0.63 0.67 0.73  CALCIUM 8.6  < > 8.2*  < > 8.1*  < > 8.8 9.0 9.9 9.9 10.0  MG 1.7  --  1.8  --  1.9  --  1.9  --   --  1.8  --   PHOS 4.1  --  4.1  --  3.0  --  5.5*  --   --  7.9*  --   < > = values in  this interval not displayed. Estimated Creatinine Clearance: 80.9 ml/min (by C-G formula based on Cr of 0.73).  LIVER  Recent Labs Lab 06/03/14 1011 06/03/14 1500  AST 47* 66*  ALT 29 27  ALKPHOS 131* 117  BILITOT 0.3 0.4  PROT 7.5 7.3  ALBUMIN 3.7 3.6  INR  --  1.12   INFECTIOUS  Recent Labs Lab 06/03/14 1500 06/04/14 0322 06/05/14 0225 06/06/14 2350  LATICACIDVEN 3.9*  --   --  0.9  PROCALCITON 0.25 0.39 0.24  --    ENDOCRINE CBG (last 3)   Recent Labs  06/09/14 0002 06/09/14 0505 06/09/14 0800  GLUCAP 150* 145* 195*   IMAGING x48h  Dg Chest Port 1 View  06/09/2014   CLINICAL DATA:  Respiratory failure.  EXAM: PORTABLE CHEST - 1 VIEW  COMPARISON:  06/08/2014  FINDINGS: Endotracheal tube tip is approximately 4 cm above the carina. Lungs show improved aeration at the left base with mild residual atelectasis remaining. There is no evidence of pulmonary edema, consolidation, pneumothorax, nodule or pleural fluid. The heart size and mediastinal contours are within normal limits. Nasogastric tube continues to extend below the diaphragm.  IMPRESSION: Improved aeration at the left lung base.   Electronically Signed   By: Aletta Edouard M.D.   On: 06/09/2014 07:44   Dg Chest Port 1 View  06/08/2014   CLINICAL DATA:  COPD.  Respiratory failure.  EXAM: PORTABLE CHEST - 1 VIEW  COMPARISON:  Single view of the chest 06/07/2014 and 06/05/2014.  FINDINGS: Support tubes and lines are unchanged. No pneumothorax is identified. Interstitial edema seen on yesterday's examination continues to improve. There is some new subsegmental atelectasis in the left lung base. Heart size is normal.  IMPRESSION: Continued improvement in interstitial pulmonary edema.  Increased subsegmental atelectasis left lung base.   Electronically Signed   By: Inge Rise M.D.   On: 06/08/2014 08:08   ASSESSMENT / PLAN:  PULMONARY A: Acute hypoxemic respiratory failure due to CHF complicated by  COPD Treating empirically as CAP COPD but not clearly in exacerbation Current smoker, 120 pack-years pulm edema  P: - Continue PS trials today, no  extubation today. - Supplementary O2 for sats > 92%. - Continue lasix to neg balance, see below. - Continue scheduled and prn albuterol.  CARDIOVASCULAR A: NSTEMI Acute decompensated systolic heart failure  P: - Per cardiology. - ASA, hep drip- per cards - Continue lasix - Target net negative I/O  RENAL A: Mild hypomag and hypok Overload, pulm edema  P:  - Replete electrolytes as needed. - KVO IVF. - Follow BMets - Lasix drip, increase to 10 mg/hr. - Add zaroxolyn 5 mg PO x1 now.  GASTROINTESTINAL A: Hx of tubular adenoma of colon P: - Continue TF - PPI  HEMATOLOGIC A: Leukocytosis Hx thrombocytopenia P:  - CBC daily. - PRBC for hgb < 8gm% due to NSTEMI. - Continue heparin drip.  INFECTIOUS A: Unimpressed cap   P:   - D/Ced azithro - Continue rocephin for now  ENDOCRINE A: No acute issues  P:   - CBG and ISS.  NEUROLOGIC A: Acute encephalopathy due to Alcohol withdrawal > improved - hx of hospitalization for w/d in 2009 Hx Anxiety, Depression Hx TIA, 2009  P:  - Ativan prn, fent prn. - Diprivan gtt with wua, if unable to extubate in AM will d/c propofol for versed. - Add fent gtt if needed. - Thiamine/folate.  The patient is critically ill with multiple organ systems failure and requires high complexity decision making for assessment and support, frequent evaluation and titration of therapies, application of advanced monitoring technologies and extensive interpretation of multiple databases.   Critical Care Time devoted to patient care services described in this note is 35 Minutes.  Rush Farmer, M.D. [facility] Pulmonary/Critical Care Medicine. Pager: [phone]. After hours pager: [phone].   11:10 AM

## 2014-06-09 NOTE — Progress Notes (Signed)
ANTICOAGULATION CONSULT NOTE - Follow Up Consult  Pharmacy Consult for Heparin Indication: NSTEMI  No Known Allergies  Patient Measurements: Height: 5\' 6"  (167.6 cm) Weight: 142 lb 6.7 oz (64.6 kg) IBW/kg (Calculated) : 63.8 Heparin Dosing Weight: 68 kg  Vital Signs: Temp: 99.4 F (37.4 C) (07/15 0800) Temp src: Oral (07/15 0800) BP: 116/71 mmHg (07/15 0911) Pulse Rate: 103 (07/15 0911)  Labs:  Recent Labs  06/07/14 0239  06/08/14 0331 06/08/14 0914 06/08/14 1135 06/08/14 2238 06/09/14 0255  HGB 9.4*  --  10.9*  --   --   --  11.4*  HCT 28.9*  --  32.5*  --   --   --  34.1*  PLT 216  --  313  --   --   --  436*  HEPARINUNFRC 0.25*  --  <0.10*  --  0.51  --  0.46  CREATININE 0.56  < > 0.50 0.43*  --  0.63 0.67  TROPONINI 4.84*  --   --   --   --   --   --   < > = values in this interval not displayed.  Estimated Creatinine Clearance: 80.9 ml/min (by C-G formula based on Cr of 0.67).  Assessment: 68 yo male with NSTEMI on heparin and heparin level at goal (HL= 0.46). Planning cardiac cath after extubation.  Goal of Therapy:  Heparin level 0.3-0.7 units/ml Monitor platelets by anticoagulation protocol: Yes   Plan:  -No heparin changes needed -Daily heparin level and CBC  Hildred Laser, Pharm D 06/09/2014 10:00 AM

## 2014-06-09 NOTE — Progress Notes (Signed)
Patient ID: Roy [name], male   DOB: 04/01/1946, 69 y.o.   MRN: [id number]    Subjective:  Intubated and sedated   Objective:  Filed Vitals:   06/09/14 0500 06/09/14 0600 06/09/14 0700 06/09/14 0800  BP:  120/73 132/73 151/81  Pulse: 101 100 99 110  Temp: 98.6 F (37 C)   99.4 F (37.4 C)  TempSrc: Oral   Oral  Resp: 20 22 22 28   Height:      Weight: 142 lb 6.7 oz (64.6 kg)     SpO2: 99% 99% 100% 100%    Intake/Output from previous day:  Intake/Output Summary (Last 24 hours) at 06/09/14 0830 Last data filed at 06/09/14 0800  Gross per 24 hour  Intake 2815.27 ml  Output   3250 ml  Net -434.73 ml    Physical Exam: Physical exam: Intubated  Skin is warm and dry.  HEENT is normal.  Neck is supple.  JVP is elevated to 3 cm above the sternal angle. Chest basilar crackles and diminished BS Cardiovascular exam distant heart sounds; regular rate and rhythm.  Abdominal exam nontender or distended. No masses palpated. Extremities show no edema. neuro grossly intact   Lab Results: Basic Metabolic Panel:  Recent Labs  06/08/14 0331  06/08/14 2238 06/09/14 0255  NA 138  < > 141 140  K 4.1  < > 4.2 3.8  CL 94*  < > 92* 90*  CO2 27  < > 29 29  GLUCOSE 126*  < > 153* 168*  BUN 14  < > 26* 32*  CREATININE 0.50  < > 0.63 0.67  CALCIUM 8.8  < > 9.9 9.9  MG 1.9  --   --  1.8  PHOS 5.5*  --   --  7.9*  < > = values in this interval not displayed. CBC:  Recent Labs  06/08/14 0331 06/09/14 0255  WBC 11.7* 13.3*  NEUTROABS 8.8* 10.3*  HGB 10.9* 11.4*  HCT 32.5* 34.1*  MCV 86.9 85.7  PLT 313 436*   Cardiac Enzymes:  Recent Labs  06/07/14 0239  TROPONINI 4.84*   . antiseptic oral rinse  15 mL Mouth Rinse QID  . aspirin  81 mg Oral Daily  . atorvastatin  80 mg Oral q1800  . cefTRIAXone (ROCEPHIN)  IV  1 g Intravenous Q24H  . chlorhexidine  15 mL Mouth Rinse BID  . folic acid  1 mg Oral Daily  . insulin aspart  0-15 Units Subcutaneous 6 times per day  .  ipratropium-albuterol  3 mL Nebulization Q4H  . metoprolol tartrate  12.5 mg Oral BID  . pantoprazole sodium  40 mg Per Tube Daily  . thiamine  100 mg Oral Daily    . sodium chloride 5 mL/hr at 06/08/14 1900  . feeding supplement (VITAL HIGH PROTEIN) 1,000 mL (06/08/14 2200)  . furosemide (LASIX) infusion 8 mg/hr (06/08/14 1900)  . heparin 1,400 Units/hr (06/08/14 1900)  . propofol 20 mcg/kg/min (06/09/14 0747)    Assessment/Plan:  1 non-ST elevation myocardial infarction-patient has ruled in. Troponin elevated to 4.15 and trending down. Continue aspirin, heparin, statin  Increase beta blocker  He will ultimately need cardiac catheterization. 2 alcohol abuse-BZ as needed  3 tobacco abuse- makes weaning more difficutl . 4 hyperlipidemia-on statin. 5 hypertension- metoprolol 12.5 mg by mouth twice a day added. Follow blood pressure and adjust medications as needed. 6 acute diastolic congestive heart failure-   CXR with decreasing edema today continue diuresis 7 COPD-management  per critical care medicine. 8 history of pulmonary nodules 9. New cardiomyopathy, presumably ischemic - Echo shows LVEF of 20%, with global hypokinesis. On lasix gtts.  Add plavix in setting of SEMI and previous Right ICA stent    Jenkins Rouge 06/09/2014, 8:30 AM

## 2014-06-10 ENCOUNTER — Inpatient Hospital Stay (HOSPITAL_COMMUNITY): Payer: Medicare Other

## 2014-06-10 LAB — BLOOD GAS, ARTERIAL
ACID-BASE EXCESS: 6.7 mmol/L — AB (ref 0.0–2.0)
Bicarbonate: 30.3 mEq/L — ABNORMAL HIGH (ref 20.0–24.0)
Drawn by: 41977
FIO2: 30 %
LHR: 16 {breaths}/min
MECHVT: 550 mL
O2 Saturation: 97.1 %
PCO2 ART: 41.3 mmHg (ref 35.0–45.0)
PEEP: 5 cmH2O
Patient temperature: 99.4
TCO2: 31.5 mmol/L (ref 0–100)
pH, Arterial: 7.481 — ABNORMAL HIGH (ref 7.350–7.450)
pO2, Arterial: 96.6 mmHg (ref 80.0–100.0)

## 2014-06-10 LAB — URINALYSIS, ROUTINE W REFLEX MICROSCOPIC
Bilirubin Urine: NEGATIVE
GLUCOSE, UA: NEGATIVE mg/dL
Ketones, ur: NEGATIVE mg/dL
Nitrite: NEGATIVE
PH: 6 (ref 5.0–8.0)
Protein, ur: 30 mg/dL — AB
SPECIFIC GRAVITY, URINE: 1.012 (ref 1.005–1.030)
Urobilinogen, UA: 0.2 mg/dL (ref 0.0–1.0)

## 2014-06-10 LAB — CBC WITH DIFFERENTIAL/PLATELET
BASOS ABS: 0 10*3/uL (ref 0.0–0.1)
Basophils Relative: 0 % (ref 0–1)
EOS ABS: 0.1 10*3/uL (ref 0.0–0.7)
EOS PCT: 0 % (ref 0–5)
HCT: 39.6 % (ref 39.0–52.0)
Hemoglobin: 12.8 g/dL — ABNORMAL LOW (ref 13.0–17.0)
Lymphocytes Relative: 5 % — ABNORMAL LOW (ref 12–46)
Lymphs Abs: 1 10*3/uL (ref 0.7–4.0)
MCH: 28.7 pg (ref 26.0–34.0)
MCHC: 32.3 g/dL (ref 30.0–36.0)
MCV: 88.8 fL (ref 78.0–100.0)
Monocytes Absolute: 2.6 10*3/uL — ABNORMAL HIGH (ref 0.1–1.0)
Monocytes Relative: 14 % — ABNORMAL HIGH (ref 3–12)
Neutro Abs: 15.2 10*3/uL — ABNORMAL HIGH (ref 1.7–7.7)
Neutrophils Relative %: 81 % — ABNORMAL HIGH (ref 43–77)
PLATELETS: 536 10*3/uL — AB (ref 150–400)
RBC: 4.46 MIL/uL (ref 4.22–5.81)
RDW: 15.2 % (ref 11.5–15.5)
WBC: 18.8 10*3/uL — ABNORMAL HIGH (ref 4.0–10.5)

## 2014-06-10 LAB — GLUCOSE, CAPILLARY
GLUCOSE-CAPILLARY: 189 mg/dL — AB (ref 70–99)
GLUCOSE-CAPILLARY: 196 mg/dL — AB (ref 70–99)
Glucose-Capillary: 155 mg/dL — ABNORMAL HIGH (ref 70–99)
Glucose-Capillary: 163 mg/dL — ABNORMAL HIGH (ref 70–99)
Glucose-Capillary: 172 mg/dL — ABNORMAL HIGH (ref 70–99)
Glucose-Capillary: 173 mg/dL — ABNORMAL HIGH (ref 70–99)
Glucose-Capillary: 203 mg/dL — ABNORMAL HIGH (ref 70–99)

## 2014-06-10 LAB — BASIC METABOLIC PANEL
ANION GAP: 23 — AB (ref 5–15)
Anion gap: 24 — ABNORMAL HIGH (ref 5–15)
Anion gap: 25 — ABNORMAL HIGH (ref 5–15)
BUN: 66 mg/dL — AB (ref 6–23)
BUN: 77 mg/dL — ABNORMAL HIGH (ref 6–23)
BUN: 91 mg/dL — ABNORMAL HIGH (ref 6–23)
CALCIUM: 10.6 mg/dL — AB (ref 8.4–10.5)
CALCIUM: 10.4 mg/dL (ref 8.4–10.5)
CALCIUM: 10.7 mg/dL — AB (ref 8.4–10.5)
CO2: 31 mEq/L (ref 19–32)
CO2: 29 meq/L (ref 19–32)
CO2: 30 meq/L (ref 19–32)
Chloride: 92 mEq/L — ABNORMAL LOW (ref 96–112)
Chloride: 92 mEq/L — ABNORMAL LOW (ref 96–112)
Chloride: 89 mEq/L — ABNORMAL LOW (ref 96–112)
Creatinine, Ser: 1.02 mg/dL (ref 0.50–1.35)
Creatinine, Ser: 1.02 mg/dL (ref 0.50–1.35)
Creatinine, Ser: 1.12 mg/dL (ref 0.50–1.35)
GFR calc Af Amer: 86 mL/min — ABNORMAL LOW (ref >90)
GFR calc non Af Amer: 74 mL/min — ABNORMAL LOW (ref >90)
GFR, EST AFRICAN AMERICAN: 86 mL/min — AB (ref >90)
GFR, EST AFRICAN AMERICAN: 77 mL/min — AB (ref >90)
GFR, EST NON AFRICAN AMERICAN: 74 mL/min — AB (ref >90)
GFR, EST NON AFRICAN AMERICAN: 66 mL/min — AB (ref >90)
GLUCOSE: 185 mg/dL — AB (ref 70–99)
Glucose, Bld: 216 mg/dL — ABNORMAL HIGH (ref 70–99)
Glucose, Bld: 203 mg/dL — ABNORMAL HIGH (ref 70–99)
Potassium: 3.3 mEq/L — ABNORMAL LOW (ref 3.7–5.3)
Potassium: 3.3 mEq/L — ABNORMAL LOW (ref 3.7–5.3)
Potassium: 4 mEq/L (ref 3.7–5.3)
SODIUM: 147 meq/L (ref 137–147)
SODIUM: 146 meq/L (ref 137–147)
SODIUM: 142 meq/L (ref 137–147)

## 2014-06-10 LAB — PHOSPHORUS: Phosphorus: 7.1 mg/dL — ABNORMAL HIGH (ref 2.3–4.6)

## 2014-06-10 LAB — URINE MICROSCOPIC-ADD ON

## 2014-06-10 LAB — HEPARIN LEVEL (UNFRACTIONATED): HEPARIN UNFRACTIONATED: 0.65 [IU]/mL (ref 0.30–0.70)

## 2014-06-10 LAB — MAGNESIUM: Magnesium: 2.6 mg/dL — ABNORMAL HIGH (ref 1.5–2.5)

## 2014-06-10 MED ORDER — METOLAZONE 10 MG PO TABS
10.0000 mg | ORAL_TABLET | Freq: Every day | ORAL | Status: AC
  Administered 2014-06-10: 10 mg via ORAL
  Filled 2014-06-10: qty 1

## 2014-06-10 MED ORDER — ACETAZOLAMIDE SODIUM 500 MG IJ SOLR
250.0000 mg | Freq: Three times a day (TID) | INTRAMUSCULAR | Status: AC
  Administered 2014-06-10 (×2): 250 mg via INTRAVENOUS
  Filled 2014-06-10 (×2): qty 500

## 2014-06-10 MED ORDER — CEFTRIAXONE SODIUM 1 G IJ SOLR
1.0000 g | INTRAMUSCULAR | Status: DC
  Administered 2014-06-10 – 2014-06-11 (×2): 1 g via INTRAVENOUS
  Filled 2014-06-10 (×2): qty 10

## 2014-06-10 MED ORDER — FUROSEMIDE 10 MG/ML IJ SOLN
10.0000 mg/h | INTRAMUSCULAR | Status: AC
  Administered 2014-06-10: 10 mg/h via INTRAVENOUS
  Filled 2014-06-10: qty 25

## 2014-06-10 MED ORDER — POTASSIUM CHLORIDE 20 MEQ/15ML (10%) PO LIQD
40.0000 meq | Freq: Three times a day (TID) | ORAL | Status: AC
  Administered 2014-06-10 (×3): 40 meq
  Filled 2014-06-10 (×3): qty 30

## 2014-06-10 MED ORDER — FREE WATER
250.0000 mL | Freq: Four times a day (QID) | Status: DC
  Administered 2014-06-10 – 2014-06-11 (×4): 250 mL

## 2014-06-10 MED ORDER — VITAL HIGH PROTEIN PO LIQD
1000.0000 mL | ORAL | Status: DC
  Administered 2014-06-10: 1000 mL
  Filled 2014-06-10 (×6): qty 1000

## 2014-06-10 NOTE — Progress Notes (Signed)
ANTICOAGULATION CONSULT NOTE - Follow Up Consult  Pharmacy Consult for Heparin Indication: NSTEMI  No Known Allergies  Patient Measurements: Height: 5\' 6"  (167.6 cm) Weight: 135 lb 12.9 oz (61.6 kg) IBW/kg (Calculated) : 63.8 Heparin Dosing Weight: 68 kg  Vital Signs: Temp: 99.5 F (37.5 C) (07/16 0700) Temp src: Oral (07/16 0700) BP: 150/83 mmHg (07/16 0842) Pulse Rate: 102 (07/16 0842)  Labs:  Recent Labs  06/08/14 0331  06/08/14 1135  06/09/14 0255  06/09/14 2257 06/10/14 0246 06/10/14 0856  HGB 10.9*  --   --   --  11.4*  --   --  12.8*  --   HCT 32.5*  --   --   --  34.1*  --   --  39.6  --   PLT 313  --   --   --  436*  --   --  536*  --   HEPARINUNFRC <0.10*  --  0.51  --  0.46  --   --  0.65  --   CREATININE 0.50  < >  --   < > 0.67  < > 0.98 1.02 1.02  < > = values in this interval not displayed.  Estimated Creatinine Clearance: 61.2 ml/min (by C-G formula based on Cr of 1.02).  Assessment: 68 yo male with NSTEMI on heparin and heparin level at goal (HL= 0.65). Planning cardiac cath after extubation.  Goal of Therapy:  Heparin level 0.3-0.7 units/ml Monitor platelets by anticoagulation protocol: Yes   Plan:  -No heparin changes needed -Daily heparin level and CBC  Hildred Laser, Pharm D 06/10/2014 10:09 AM

## 2014-06-10 NOTE — Progress Notes (Signed)
PULMONARY / CRITICAL CARE MEDICINE   Name: Roy [name] MRN: [id number] DOB: 1946/01/04    ADMISSION DATE:  06/03/2014 CONSULTATION DATE:  06/03/2014  REFERRING MD :  Lacinda Axon ([facility]) PRIMARY SERVICE: PCCM  CHIEF COMPLAINT:  SOB, Respiratory Distress  BRIEF PATIENT DESCRIPTION: Patient is a 68 year old male, current smoker (120 pack years) with PMH of COPD, alcohol abuse, chronic bronchitis, TIA, HTN, LBBB who presented to [facility] ED via EMS 7/9 for progressively worsening SOB and respiratory distress. Per EMS, sats in 70s on arrival to pt's home. Patient was started on CPAP and transitioned to BiPAP in ED. Concern ischemia, MI  LINES / TUBES: ETT 7/10>>> R IJ TLC 7/10>>>  CULTURES: 7/9 BC x 2 >>> 7/9 UC >>> 7/9 MRSA by PCR >>>  ANTIBIOTICS: Azithromycin 7/9 >>>7/13 Ceftriaxone 7/9 >>>  SIGNIFICANT EVENTS / STUDIES:  7/09 - Presented to [facility] ED w/ SOB 7/09 - EKG >>> no acute MI, troponin of 1.46 7/09 - Tx to [facility] 7/10- requires high flow O2, ETOH WD concern 7/10 - TTE > LVEF 20%, physiology suggestive of restriction, LAE 7/12 ett emergent 7/13- lasix to  Neg balance successful  SUBJECTIVE/OVERNIGHT/INTERVAL HX Not weaning well this AM, agitation much improved, UOP bloody.  VITAL SIGNS: Temp:  [97.5 F (36.4 C)-100 F (37.8 C)] 97.5 F (36.4 C) (07/15 2332) Pulse Rate:  [94-112] 109 (07/16 0419) Resp:  [11-36] 24 (07/16 0419) BP: (112-161)/(68-104) 137/74 mmHg (07/16 0419) SpO2:  [95 %-100 %] 100 % (07/16 0419) FiO2 (%):  [30 %] 30 % (07/16 0419)  HEMODYNAMICS:   VENTILATOR SETTINGS: Vent Mode:  [-] PRVC FiO2 (%):  [30 %] 30 % Set Rate:  [16 bmp] 16 bmp Vt Set:  [550 mL] 550 mL PEEP:  [5 cmH20] 5 cmH20 Pressure Support:  [5 cmH20-10 cmH20] 10 cmH20 Plateau Pressure:  [15 cmH20-16 cmH20] 15 cmH20  INTAKE / OUTPUT: Intake/Output     07/15 0701 - 07/16 0700 07/16 0701 - 07/17 0700   I.V. (mL/kg) 635.3 (9.8)    NG/GT 1485    IV Piggyback 50    Total Intake(mL/kg)  2170.3 (33.6)    Urine (mL/kg/hr) 2475 (1.6)    Stool 4 (0)    Total Output 2479     Net -308.7          Stool Occurrence 1 x     PHYSICAL EXAMINATION:  Gen: synchrous, awake, interactive and noding appropriately to questions. HEENT: [place]/AT PULM: Crackles bases improved CV: RRR, no mgr, JVD AB: BS+, soft, nontender, no hsm Ext: warm, no edema Derm: no rash or skin breakdown Neuro:rass 1  LABS:  PULMONARY  Recent Labs Lab 06/03/14 1440 06/04/14 0409 06/06/14 0047 06/09/14 0500 06/10/14 0415  PHART 7.395 7.475* 7.456* 7.502* 7.481*  PCO2ART 36.0 36.0 40.8 39.0 41.3  PO2ART 53.0* 89.3 316.0* 87.0 96.6  HCO3 22.0 26.2* 28.9* 30.3* 30.3*  TCO2 23 27.3 30 31.5 31.5  O2SAT 87.0 96.7 100.0 96.4 97.1   CBC  Recent Labs Lab 06/08/14 0331 06/09/14 0255 06/10/14 0246  HGB 10.9* 11.4* 12.8*  HCT 32.5* 34.1* 39.6  WBC 11.7* 13.3* 18.8*  PLT 313 436* 536*   COAGULATION  Recent Labs Lab 06/03/14 1500  INR 1.12   CARDIAC    Recent Labs Lab 06/03/14 1011 06/03/14 1500 06/03/14 2152 06/04/14 0322 06/07/14 0239  TROPONINI 1.46* 2.59* 4.15* 4.07* 4.84*    Recent Labs Lab 06/03/14 1011 06/07/14 0239  PROBNP 5223.0* 4105.0*   CHEMISTRY  Recent Labs Lab 06/05/14  0174  06/07/14 0239  06/08/14 0331  06/08/14 2238 06/09/14 0255 06/09/14 0926 06/09/14 2257 06/10/14 0246  NA 130*  < > 138  < > 138  < > 141 140 144 144 147  K 3.8  < > 3.2*  < > 4.1  < > 4.2 3.8 3.5* 4.5 3.3*  CL 87*  < > 94*  < > 94*  < > 92* 90* 90* 93* 92*  CO2 26  < > 27  < > 27  < > 29 29 32 29 31  GLUCOSE 134*  < > 97  < > 126*  < > 153* 168* 157* 166* 185*  BUN 16  < > 13  < > 14  < > 26* 32* 41* 60* 66*  CREATININE 0.52  < > 0.56  < > 0.50  < > 0.63 0.67 0.73 0.98 1.02  CALCIUM 8.2*  < > 8.1*  < > 8.8  < > 9.9 9.9 10.0 10.5 10.6*  MG 1.8  --  1.9  --  1.9  --   --  1.8  --   --  2.6*  PHOS 4.1  --  3.0  --  5.5*  --   --  7.9*  --   --  7.1*  < > = values in this interval not  displayed. Estimated Creatinine Clearance: 63.4 ml/min (by C-G formula based on Cr of 1.02).  LIVER  Recent Labs Lab 06/03/14 1011 06/03/14 1500  AST 47* 66*  ALT 29 27  ALKPHOS 131* 117  BILITOT 0.3 0.4  PROT 7.5 7.3  ALBUMIN 3.7 3.6  INR  --  1.12   INFECTIOUS  Recent Labs Lab 06/03/14 1500 06/04/14 0322 06/05/14 0225 06/06/14 2350  LATICACIDVEN 3.9*  --   --  0.9  PROCALCITON 0.25 0.39 0.24  --    ENDOCRINE CBG (last 3)   Recent Labs  06/09/14 1951 06/10/14 0009 06/10/14 0406  GLUCAP 184* 172* 173*   IMAGING x48h  Dg Chest Port 1 View  06/10/2014   CLINICAL DATA:  Assess endotracheal tube  EXAM: PORTABLE CHEST - 1 VIEW  COMPARISON:  06/09/2014  FINDINGS: There is an endotracheal to which ends between the clavicles and carina. An orogastric tube enters the stomach.  Normal heart size and mediastinal contours for technique.  Improved basilar lung aeration, with the left diaphragm now visible. No effusion or pneumothorax.  IMPRESSION: 1. Well-positioned endotracheal and orogastric tubes. 2. Continued improvement in basilar lung aeration.   Electronically Signed   By: Jorje Guild M.D.   On: 06/10/2014 06:44   Dg Chest Port 1 View  06/09/2014   CLINICAL DATA:  Respiratory failure.  EXAM: PORTABLE CHEST - 1 VIEW  COMPARISON:  06/08/2014  FINDINGS: Endotracheal tube tip is approximately 4 cm above the carina. Lungs show improved aeration at the left base with mild residual atelectasis remaining. There is no evidence of pulmonary edema, consolidation, pneumothorax, nodule or pleural fluid. The heart size and mediastinal contours are within normal limits. Nasogastric tube continues to extend below the diaphragm.  IMPRESSION: Improved aeration at the left lung base.   Electronically Signed   By: Aletta Edouard M.D.   On: 06/09/2014 07:44   ASSESSMENT / PLAN:  PULMONARY A: Acute hypoxemic respiratory failure due to CHF complicated by COPD Treating empirically as  CAP COPD but not clearly in exacerbation Current smoker, 120 pack-years pulm edema  P: - Continue PS trials today, no extubation today, would like  to see volume negative prior to serious consideration of extubation. - Supplementary O2 for sats > 92%. - Continue lasix to neg balance, see below. - Continue scheduled and prn albuterol.  CARDIOVASCULAR A: NSTEMI Acute decompensated systolic heart failure  P: - Per cardiology. - ASA, hep drip- per cards. - Continue lasix as ordered. - Target net negative I/O.  RENAL A: Mild hypomag and hypok Overload, pulm edema  P:  - Replete electrolytes as needed. - KVO IVF. - Follow BMets - Lasix drip, increased to 10 mg/hr. - Add zaroxolyn 10 mg PO x1 now. - Will add acetazolamide and free water.  GASTROINTESTINAL A: Hx of tubular adenoma of colon P: - Continue TF - PPI  HEMATOLOGIC A: Leukocytosis Hx thrombocytopenia P:  - CBC daily. - PRBC for hgb < 8gm% due to NSTEMI. - Continue heparin drip.  INFECTIOUS A: Unimpressed cap   P:   - D/Ced azithro - Continue rocephin for now, need 8 days total, will likely d/c in AM.  ENDOCRINE A: No acute issues  P:   - CBG and ISS.  NEUROLOGIC A: Acute encephalopathy due to Alcohol withdrawal > improved - hx of hospitalization for w/d in 2009 Hx Anxiety, Depression Hx TIA, 2009  P:  - Ativan prn, fent prn. - Diprivan gtt off for now. - Add fent gtt if needed. - Thiamine/folate.  Will aggressively diurese today, PS trials and SBT in AM, anticipate will be able to extubate in AM.  The patient is critically ill with multiple organ systems failure and requires high complexity decision making for assessment and support, frequent evaluation and titration of therapies, application of advanced monitoring technologies and extensive interpretation of multiple databases.   Critical Care Time devoted to patient care services described in this note is 35 Minutes.  Rush Farmer, M.D. [facility] Pulmonary/Critical Care Medicine. Pager: [phone]. After hours pager: [phone].   7:05 AM

## 2014-06-10 NOTE — Progress Notes (Signed)
NUTRITION FOLLOW-UP  DOCUMENTATION CODES Per approved criteria  -Not Applicable   INTERVENTION: To better meet nutrition needs, increase Vital High Protein to goal of 70 ml/hr. Goal regimen will provide: 1680 kcal, 148 grams protein, and 1404 ml free water. RD to continue to follow nutrition care plan.  NUTRITION DIAGNOSIS: Inadequate oral intake related to inability to eat as evidenced by npo status. Ongoing.   Goal: Intake to meet >90% of estimated nutrition needs. Met.  Monitor:  weight trends, lab trends, I/O's, ventilator use, tolerance of nutrition support  ASSESSMENT: PMHx significant for COPD, ETOH abuse, chronic bronchitis, TIA, HTN, LBBB. Admitted with progressively worsening SOB and respiratory distress. Work-up reveals acute hypoxemic respiratory failure.  Per chart, pt smokes 2-3 packs of cigarettes daily and drinks a case of beer daily.  Intubated 7/12 after pt developed acute hypoxemic respiratory failure, severe agitation and hallucinations.  Patient is currently intubated on ventilator support. Possible extubation tomorrow. MV: 11.2 L/min Temp (24hrs), Avg:99.1 F (37.3 C), Min:97.5 F (36.4 C), Max:100 F (37.8 C)  Propofol: off  Currently receiving Vital High Protein at 65 ml/hr with 250 ml free water flushes QID. Tolerating well.  CBG's: 173 - 196 Sodium WNL Potassium low at 3.3 --> ordered for IV KCl Phosphorus elevated at 7.1 Magnesium elevated at 2.6   Height: Ht Readings from Last 1 Encounters:  06/04/14 5' 6"  (1.676 m)    Weight: Wt Readings from Last 1 Encounters:  06/10/14 135 lb 12.9 oz (61.6 kg)  Admit wt 153 lb  BMI:  Body mass index is 21.93 kg/(m^2). WNL  Estimated Nutritional Needs: Kcal: 1728 Protein: at least 102 g Fluid: approx 2 liters daily  Skin: intact  Diet Order: NPO    Intake/Output Summary (Last 24 hours) at 06/10/14 1338 Last data filed at 06/10/14 1200  Gross per 24 hour  Intake   2614 ml  Output    2030 ml  Net    584 ml    Last BM: 7/16  Labs:   Recent Labs Lab 06/08/14 0331  06/09/14 0255  06/09/14 2257 06/10/14 0246 06/10/14 0856  NA 138  < > 140  < > 144 147 146  K 4.1  < > 3.8  < > 4.5 3.3* 3.3*  CL 94*  < > 90*  < > 93* 92* 92*  CO2 27  < > 29  < > 29 31 29   BUN 14  < > 32*  < > 60* 66* 77*  CREATININE 0.50  < > 0.67  < > 0.98 1.02 1.02  CALCIUM 8.8  < > 9.9  < > 10.5 10.6* 10.4  MG 1.9  --  1.8  --   --  2.6*  --   PHOS 5.5*  --  7.9*  --   --  7.1*  --   GLUCOSE 126*  < > 168*  < > 166* 185* 216*  < > = values in this interval not displayed.  CBG (last 3)   Recent Labs  06/10/14 0406 06/10/14 0721 06/10/14 1130  GLUCAP 173* 189* 196*    Scheduled Meds: . acetaZOLAMIDE  250 mg Intravenous Q8H  . antiseptic oral rinse  15 mL Mouth Rinse QID  . aspirin  81 mg Oral Daily  . atorvastatin  80 mg Oral q1800  . cefTRIAXone (ROCEPHIN)  IV  1 g Intravenous Q24H  . chlorhexidine  15 mL Mouth Rinse BID  . clopidogrel  75 mg Oral Daily  . folic  acid  1 mg Oral Daily  . free water  250 mL Per Tube Q6H  . insulin aspart  0-15 Units Subcutaneous 6 times per day  . ipratropium-albuterol  3 mL Nebulization Q4H  . metoprolol tartrate  25 mg Oral TID  . pantoprazole sodium  40 mg Per Tube Daily  . potassium chloride  40 mEq Per Tube TID  . thiamine  100 mg Oral Daily    Continuous Infusions: . sodium chloride 5 mL/hr at 06/10/14 0900  . feeding supplement (VITAL HIGH PROTEIN) 1,000 mL (06/10/14 0439)  . furosemide (LASIX) infusion 15 mg/hr (06/10/14 0741)  . heparin 14 Units (06/10/14 0700)  . propofol Stopped (06/09/14 0830)    Inda Coke MS, RD, LDN Inpatient Registered Dietitian Pager: [phone] After-hours pager: [phone]

## 2014-06-11 ENCOUNTER — Inpatient Hospital Stay (HOSPITAL_COMMUNITY): Payer: Medicare Other

## 2014-06-11 LAB — BLOOD GAS, ARTERIAL
Acid-Base Excess: 5.8 mmol/L — ABNORMAL HIGH (ref 0.0–2.0)
BICARBONATE: 30 meq/L — AB (ref 20.0–24.0)
DRAWN BY: 31101
FIO2: 30 %
MECHVT: 550 mL
O2 SAT: 97.2 %
PATIENT TEMPERATURE: 98.6
PEEP/CPAP: 5 cmH2O
PH ART: 7.432 (ref 7.350–7.450)
RATE: 16 resp/min
TCO2: 31.4 mmol/L (ref 0–100)
pCO2 arterial: 45.8 mmHg — ABNORMAL HIGH (ref 35.0–45.0)
pO2, Arterial: 103 mmHg — ABNORMAL HIGH (ref 80.0–100.0)

## 2014-06-11 LAB — BASIC METABOLIC PANEL
Anion gap: 22 — ABNORMAL HIGH (ref 5–15)
Anion gap: 24 — ABNORMAL HIGH (ref 5–15)
BUN: 100 mg/dL — ABNORMAL HIGH (ref 6–23)
BUN: 106 mg/dL — AB (ref 6–23)
CALCIUM: 10.6 mg/dL — AB (ref 8.4–10.5)
CO2: 29 mEq/L (ref 19–32)
CO2: 29 mEq/L (ref 19–32)
CREATININE: 1.18 mg/dL (ref 0.50–1.35)
Calcium: 10.7 mg/dL — ABNORMAL HIGH (ref 8.4–10.5)
Chloride: 93 mEq/L — ABNORMAL LOW (ref 96–112)
Chloride: 92 mEq/L — ABNORMAL LOW (ref 96–112)
Creatinine, Ser: 1.14 mg/dL (ref 0.50–1.35)
GFR calc Af Amer: 72 mL/min — ABNORMAL LOW (ref >90)
GFR calc Af Amer: 75 mL/min — ABNORMAL LOW (ref >90)
GFR, EST NON AFRICAN AMERICAN: 62 mL/min — AB (ref >90)
GFR, EST NON AFRICAN AMERICAN: 65 mL/min — AB (ref >90)
GLUCOSE: 175 mg/dL — AB (ref 70–99)
Glucose, Bld: 165 mg/dL — ABNORMAL HIGH (ref 70–99)
POTASSIUM: 3.4 meq/L — AB (ref 3.7–5.3)
Potassium: 4.1 mEq/L (ref 3.7–5.3)
SODIUM: 144 meq/L (ref 137–147)
Sodium: 145 mEq/L (ref 137–147)

## 2014-06-11 LAB — GLUCOSE, CAPILLARY
Glucose-Capillary: 182 mg/dL — ABNORMAL HIGH (ref 70–99)
Glucose-Capillary: 166 mg/dL — ABNORMAL HIGH (ref 70–99)
Glucose-Capillary: 165 mg/dL — ABNORMAL HIGH (ref 70–99)
Glucose-Capillary: 123 mg/dL — ABNORMAL HIGH (ref 70–99)
Glucose-Capillary: 129 mg/dL — ABNORMAL HIGH (ref 70–99)
Glucose-Capillary: 123 mg/dL — ABNORMAL HIGH (ref 70–99)

## 2014-06-11 LAB — HEPARIN LEVEL (UNFRACTIONATED)
Heparin Unfractionated: 0.94 IU/mL — ABNORMAL HIGH (ref 0.30–0.70)
Heparin Unfractionated: 0.94 IU/mL — ABNORMAL HIGH (ref 0.30–0.70)
Heparin Unfractionated: 0.68 IU/mL (ref 0.30–0.70)

## 2014-06-11 LAB — CBC
HCT: 38.7 % — ABNORMAL LOW (ref 39.0–52.0)
Hemoglobin: 12.7 g/dL — ABNORMAL LOW (ref 13.0–17.0)
MCH: 28.9 pg (ref 26.0–34.0)
MCHC: 32.8 g/dL (ref 30.0–36.0)
MCV: 88 fL (ref 78.0–100.0)
Platelets: 642 10*3/uL — ABNORMAL HIGH (ref 150–400)
RBC: 4.4 MIL/uL (ref 4.22–5.81)
RDW: 15.3 % (ref 11.5–15.5)
WBC: 18.2 10*3/uL — ABNORMAL HIGH (ref 4.0–10.5)

## 2014-06-11 LAB — MAGNESIUM: MAGNESIUM: 3 mg/dL — AB (ref 1.5–2.5)

## 2014-06-11 LAB — PHOSPHORUS: Phosphorus: 6.4 mg/dL — ABNORMAL HIGH (ref 2.3–4.6)

## 2014-06-11 MED ORDER — FUROSEMIDE 10 MG/ML IJ SOLN
40.0000 mg | Freq: Three times a day (TID) | INTRAMUSCULAR | Status: AC
  Administered 2014-06-11 (×2): 40 mg via INTRAVENOUS
  Filled 2014-06-11 (×2): qty 4

## 2014-06-11 MED ORDER — POTASSIUM CHLORIDE 10 MEQ/100ML IV SOLN
10.0000 meq | INTRAVENOUS | Status: AC
  Administered 2014-06-11 (×4): 10 meq via INTRAVENOUS
  Filled 2014-06-11 (×4): qty 100

## 2014-06-11 MED ORDER — INSULIN ASPART 100 UNIT/ML ~~LOC~~ SOLN: 0.0000 [IU] | Freq: Every day | SUBCUTANEOUS | Status: DC

## 2014-06-11 MED ORDER — METOPROLOL TARTRATE 1 MG/ML IV SOLN: 2.5000 mg | INTRAVENOUS | Status: DC | PRN

## 2014-06-11 MED ORDER — INSULIN ASPART 100 UNIT/ML ~~LOC~~ SOLN
0.0000 [IU] | Freq: Three times a day (TID) | SUBCUTANEOUS | Status: DC
  Administered 2014-06-12 (×2): 2 [IU] via SUBCUTANEOUS
  Administered 2014-06-12: 3 [IU] via SUBCUTANEOUS
  Administered 2014-06-13 – 2014-06-14 (×3): 2 [IU] via SUBCUTANEOUS
  Administered 2014-06-14 (×2): 3 [IU] via SUBCUTANEOUS
  Administered 2014-06-15 – 2014-06-16 (×3): 2 [IU] via SUBCUTANEOUS
  Administered 2014-06-16 – 2014-06-17 (×3): 3 [IU] via SUBCUTANEOUS
  Administered 2014-06-17 – 2014-06-21 (×9): 2 [IU] via SUBCUTANEOUS

## 2014-06-11 NOTE — Progress Notes (Addendum)
PULMONARY / CRITICAL CARE MEDICINE  Name: Roy [name] MRN: [id number] DOB: May 04, 1946    ADMISSION DATE:  06/03/2014 CONSULTATION DATE:  06/03/2014  REFERRING MD :  Lacinda Axon ([facility]) PRIMARY SERVICE: PCCM  CHIEF COMPLAINT:  Respiratory failure  BRIEF PATIENT DESCRIPTION:  68 yo active smoker with COPD admitted 7/9 with pneumonia and hypoxemic respiratro failure  SIGNIFICANT EVENTS / STUDIES:  7/9    Transferred to [facility] 7/10  TTE >>> LVEF 20%, physiology suggestive of restriction, LAE 7/12  Intubated 7/17  Extubated  LINES / TUBES: ETT 7/10 >>> 7/17 R IJ TLC 7/10 >>> ??? Foley 7/10 >>>  CULTURES: 7/9 Blood >>> neg 7/9 Urine >>> neg 7/9 MRSA by PCR >>> neg  ANTIBIOTICS: Azithromycin 7/9 >>> 7/13 Ceftriaxone 7/9 >>>  INTERVAL HISTORY:  No acute issues overnight.  VITAL SIGNS: Temp:  [97.4 F (36.3 C)-98 F (36.7 C)] 97.4 F (36.3 C) (07/18 1100) Pulse Rate:  [87-108] 93 (07/18 1100) Resp:  [12-26] 19 (07/18 1100) BP: (96-153)/(62-98) 133/68 mmHg (07/18 1100) SpO2:  [92 %-100 %] 100 % (07/18 1100) Weight:  [60.9 kg (134 lb 4.2 oz)-62.4 kg (137 lb 9.1 oz)] 60.9 kg (134 lb 4.2 oz) (07/18 0624)  HEMODYNAMICS:   VENTILATOR SETTINGS:   INTAKE / OUTPUT: Intake/Output     07/17 0701 - 07/18 0700 07/18 0701 - 07/19 0700   I.V. (mL/kg) 330.6 (5.4)    NG/GT     IV Piggyback 450    Total Intake(mL/kg) 780.6 (12.8)    Urine (mL/kg/hr) 1855 (1.3)    Stool     Total Output 1855     Net -1074.4 0        Urine Occurrence     Stool Occurrence      PHYSICAL EXAMINATION: Gen: No distress HEENT: Moist membranes PULM: Bilateral air entry, few rales CV: regular, no murmurs AB: Soft, bowel sounds present Ext: Moves all extremities Derm: Skin breakdown perineal area  Neuro:  Awake, does not follow commands  LABS: CBC  Recent Labs Lab 06/10/14 0246 06/11/14 0306 06/12/14 0311  WBC 18.8* 18.2* 13.7*  HGB 12.8* 12.7* 13.5  HCT 39.6 38.7* 42.0  PLT 536* 642* 699*    Coag's No results found for this basename: APTT, INR,  in the last 168 hours  BMET  Recent Labs Lab 06/11/14 0306 06/11/14 0940 06/12/14 0311  NA 144 145 147  K 4.1 3.4* 3.6*  CL 93* 92* 94*  CO2 29 29 31   BUN 100* 106* 110*  CREATININE 1.18 1.14 1.14  GLUCOSE 175* 165* 146*   Electrolytes  Recent Labs Lab 06/10/14 0246  06/11/14 0306 06/11/14 0940 06/12/14 0311  CALCIUM 10.6*  < > 10.6* 10.7* 11.0*  MG 2.6*  --  3.0*  --  3.3*  PHOS 7.1*  --  6.4*  --  5.5*  < > = values in this interval not displayed.  Sepsis Markers  Recent Labs Lab 06/06/14 2350  LATICACIDVEN 0.9   ABG  Recent Labs Lab 06/09/14 0500 06/10/14 0415 06/11/14 0305  PHART 7.502* 7.481* 7.432  PCO2ART 39.0 41.3 45.8*  PO2ART 87.0 96.6 103.0*   Liver Enzymes No results found for this basename: AST, ALT, ALKPHOS, BILITOT, ALBUMIN,  in the last 168 hours  Cardiac Enzymes  Recent Labs Lab 06/07/14 0239  TROPONINI 4.84*  PROBNP 4105.0*   Glucose  Recent Labs Lab 06/11/14 0332 06/11/14 0725 06/11/14 1118 06/11/14 1640 06/11/14 1938 06/11/14 2137  GLUCAP 182* 166* 165* 123* 129* 123*  IMAGING:  Dg Chest Port 1 View  06/11/2014   CLINICAL DATA:  Respiratory distress.  Intubated patient.  EXAM: PORTABLE CHEST - 1 VIEW  COMPARISON:  Single view of the chest 06/10/2014 and 06/09/2014.  FINDINGS: ET tube remains in place and projects in good position. NG tube is seen with the side port at the gastroesophageal junction. Recommend advancement of 4-5 cm. Lungs are clear. Heart size is normal. No pneumothorax or pleural effusion. Right shoulder replacement noted.  IMPRESSION: Side port of the NG tube is at the gastroesophageal junction. Recommend advancement 4-5 cm.  ET tube in good position.  Lungs clear.   Electronically Signed   By: Inge Rise M.D.   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 P: Cardiology following ASA, Plavix, Lipitor, Metoprolol Cardiac cath 7/20 before d/c to [facility]  RENAL A: Stay balance negative 3 L P:  Trend BMP Hold further Lasix  GASTROINTESTINAL A: Dysphagia GI Px is not indicate P: NPO except Rx SLP eval in progress D/c Protonix D/c TF  HEMATOLOGIC A: VTE px Heparinization for ACS P:  Trend CBC D/c Heparin gtt ( limited benefit past 48 hours ) Start Heparin [place]  INFECTIOUS A: CAP P:   Ceftriaxone, will d/c after 10 days total  ENDOCRINE A: No acute issues  P:   SSI  NEUROLOGIC A: Acute encephalopathy Alcohol withdrawal Anxiety, depression P:  Fentanyl / Ativan PRN D/c Propofol Thiamine / Folate PT / OT  I have personally obtained history, examined patient, evaluated and interpreted laboratory and imaging results, reviewed medical records, formulated assessment / plan and placed orders.  Doree Fudge, MD Pulmonary and [place] Pager: [phone]  06/12/2014, 11:20 AM

## 2014-06-11 NOTE — Progress Notes (Signed)
PULMONARY / CRITICAL CARE MEDICINE   Name: Roy [name] MRN: [id number] DOB: 05/08/1946    ADMISSION DATE:  06/03/2014 CONSULTATION DATE:  06/03/2014  REFERRING MD :  Lacinda Axon ([facility]) PRIMARY SERVICE: PCCM  CHIEF COMPLAINT:  SOB, Respiratory Distress  BRIEF PATIENT DESCRIPTION: Patient is a 68 year old male, current smoker (120 pack years) with PMH of COPD, alcohol abuse, chronic bronchitis, TIA, HTN, LBBB who presented to [facility] ED via EMS 7/9 for progressively worsening SOB and respiratory distress. Per EMS, sats in 70s on arrival to pt's home. Patient was started on CPAP and transitioned to BiPAP in ED. Concern ischemia, MI  LINES / TUBES: ETT 7/10>>> R IJ TLC 7/10>>>  CULTURES: 7/9 BC x 2 >>> 7/9 UC >>> 7/9 MRSA by PCR >>>  ANTIBIOTICS: Azithromycin 7/9 >>>7/13 Ceftriaxone 7/9 >>>  SIGNIFICANT EVENTS / STUDIES:  7/09 - Presented to [facility] ED w/ SOB 7/09 - EKG >>> no acute MI, troponin of 1.46 7/09 - Tx to [facility] 7/10- requires high flow O2, ETOH WD concern 7/10 - TTE > LVEF 20%, physiology suggestive of restriction, LAE 7/12 ett emergent 7/13- lasix to  Neg balance successful  SUBJECTIVE/OVERNIGHT/INTERVAL HX Weaning well, awake and interactive.  VITAL SIGNS: Temp:  [97.4 F (36.3 C)-99.5 F (37.5 C)] 99.5 F (37.5 C) (07/17 1105) Pulse Rate:  [90-115] 102 (07/17 1105) Resp:  [14-23] 22 (07/17 1105) BP: (97-151)/(69-94) 129/79 mmHg (07/17 1105) SpO2:  [99 %-100 %] 100 % (07/17 1105) FiO2 (%):  [30 %] 30 % (07/17 0900) Weight:  [136 lb 7.4 oz (61.9 kg)] 136 lb 7.4 oz (61.9 kg) (07/17 0349)  HEMODYNAMICS:   VENTILATOR SETTINGS: Vent Mode:  [-] CPAP;PSV FiO2 (%):  [30 %] 30 % Set Rate:  [16 bmp] 16 bmp Vt Set:  [550 mL] 550 mL PEEP:  [5 cmH20] 5 cmH20 Pressure Support:  [5 cmH20] 5 cmH20 Plateau Pressure:  [14 cmH20-15 cmH20] 15 cmH20  INTAKE / OUTPUT: Intake/Output     07/16 0701 - 07/17 0700 07/17 0701 - 07/18 0700   I.V. (mL/kg) 649.7 (10.5) 89 (1.4)   NG/GT  2218.5    IV Piggyback 50 50   Total Intake(mL/kg) 2918.2 (47.1) 139 (2.2)   Urine (mL/kg/hr) 2660 (1.8) 455 (1.5)   Stool 2 (0)    Total Output 2662 455   Net +256.2 -316         PHYSICAL EXAMINATION:  Gen: synchrous, awake, interactive and noding appropriately to questions. HEENT: [place]/AT PULM: Crackles bases improved CV: RRR, no mgr, JVD AB: BS+, soft, nontender, no hsm Ext: warm, no edema Derm: no rash or skin breakdown Neuro:rass 1  LABS:  PULMONARY  Recent Labs Lab 06/06/14 0047 06/09/14 0500 06/10/14 0415 06/11/14 0305  PHART 7.456* 7.502* 7.481* 7.432  PCO2ART 40.8 39.0 41.3 45.8*  PO2ART 316.0* 87.0 96.6 103.0*  HCO3 28.9* 30.3* 30.3* 30.0*  TCO2 30 31.5 31.5 31.4  O2SAT 100.0 96.4 97.1 97.2   CBC  Recent Labs Lab 06/09/14 0255 06/10/14 0246 06/11/14 0306  HGB 11.4* 12.8* 12.7*  HCT 34.1* 39.6 38.7*  WBC 13.3* 18.8* 18.2*  PLT 436* 536* 642*   COAGULATION No results found for this basename: INR,  in the last 168 hours CARDIAC    Recent Labs Lab 06/07/14 0239  TROPONINI 4.84*   Recent Labs Lab 06/07/14 0239  PROBNP 4105.0*   CHEMISTRY  Recent Labs Lab 06/07/14 0239  06/08/14 0331  06/09/14 0255  06/10/14 0246 06/10/14 4401 06/10/14 2050 06/11/14 0306 06/11/14 0940  NA 138  < > 138  < > 140  < > 147 146 142 144 145  K 3.2*  < > 4.1  < > 3.8  < > 3.3* 3.3* 4.0 4.1 3.4*  CL 94*  < > 94*  < > 90*  < > 92* 92* 89* 93* 92*  CO2 27  < > 27  < > 29  < > 31 29 30 29 29   GLUCOSE 97  < > 126*  < > 168*  < > 185* 216* 203* 175* 165*  BUN 13  < > 14  < > 32*  < > 66* 77* 91* 100* 106*  CREATININE 0.56  < > 0.50  < > 0.67  < > 1.02 1.02 1.12 1.18 1.14  CALCIUM 8.1*  < > 8.8  < > 9.9  < > 10.6* 10.4 10.7* 10.6* 10.7*  MG 1.9  --  1.9  --  1.8  --  2.6*  --   --  3.0*  --   PHOS 3.0  --  5.5*  --  7.9*  --  7.1*  --   --  6.4*  --   < > = values in this interval not displayed. Estimated Creatinine Clearance: 55.1 ml/min (by C-G formula  based on Cr of 1.14).  LIVER No results found for this basename: AST, ALT, ALKPHOS, BILITOT, PROT, ALBUMIN, INR,  in the last 168 hours INFECTIOUS  Recent Labs Lab 06/05/14 0225 06/06/14 2350  LATICACIDVEN  --  0.9  PROCALCITON 0.24  --    ENDOCRINE CBG (last 3)   Recent Labs  06/10/14 2327 06/11/14 0332 06/11/14 0725  GLUCAP 155* 182* 166*   IMAGING x48h  Dg Chest Port 1 View  06/11/2014   CLINICAL DATA:  Respiratory distress.  Intubated patient.  EXAM: PORTABLE CHEST - 1 VIEW  COMPARISON:  Single view of the chest 06/10/2014 and 06/09/2014.  FINDINGS: ET tube remains in place and projects in good position. NG tube is seen with the side port at the gastroesophageal junction. Recommend advancement of 4-5 cm. Lungs are clear. Heart size is normal. No pneumothorax or pleural effusion. Right shoulder replacement noted.  IMPRESSION: Side port of the NG tube is at the gastroesophageal junction. Recommend advancement 4-5 cm.  ET tube in good position.  Lungs clear.   Electronically Signed   By: Inge Rise M.D.   On: 06/11/2014 08:00   Dg Chest Port 1 View  06/10/2014   CLINICAL DATA:  Assess endotracheal tube  EXAM: PORTABLE CHEST - 1 VIEW  COMPARISON:  06/09/2014  FINDINGS: There is an endotracheal to which ends between the clavicles and carina. An orogastric tube enters the stomach.  Normal heart size and mediastinal contours for technique.  Improved basilar lung aeration, with the left diaphragm now visible. No effusion or pneumothorax.  IMPRESSION: 1. Well-positioned endotracheal and orogastric tubes. 2. Continued improvement in basilar lung aeration.   Electronically Signed   By: Jorje Guild M.D.   On: 06/10/2014 06:44   ASSESSMENT / PLAN:  PULMONARY A: Acute hypoxemic respiratory failure due to CHF complicated by COPD Treating empirically as CAP COPD but not clearly in exacerbation Current smoker, 120 pack-years pulm edema  P: - Extubate today. - Supplementary O2  for sats > 92%. - Diureses as below. - Continue scheduled and prn albuterol.  CARDIOVASCULAR A: NSTEMI Acute decompensated systolic heart failure  P: - Per cardiology. - ASA, hep drip- per cards. - Continue lasix as  ordered. - Target net negative I/O.  RENAL A: Mild hypomag and hypok Overload, pulm edema  P:  - Replete electrolytes as needed. - KVO IVF. - Follow BMets - Lasix 40 mg IV q8 hr x2 doses.  GASTROINTESTINAL A: Hx of tubular adenoma of colon P: - Continue TF - PPI  HEMATOLOGIC A: Leukocytosis Hx thrombocytopenia P:  - CBC daily. - PRBC for hgb < 8gm% due to NSTEMI. - Continue heparin drip.  INFECTIOUS A: Unimpressed cap   P:   - D/Ced azithro - D/C rocephin today. - Follow clinically.  ENDOCRINE A: No acute issues  P:   - CBG and ISS.  NEUROLOGIC A: Acute encephalopathy due to Alcohol withdrawal > improved - hx of hospitalization for w/d in 2009 Hx Anxiety, Depression Hx TIA, 2009  P:  - Ativan prn, fent prn. - D/C propofol. - Thiamine/folate.  Extubate today, continue diureses but change to pushes, will replace electrolytes and order swallow evaluation.  The patient is critically ill with multiple organ systems failure and requires high complexity decision making for assessment and support, frequent evaluation and titration of therapies, application of advanced monitoring technologies and extensive interpretation of multiple databases.   Critical Care Time devoted to patient care services described in this note is 35 Minutes.  Rush Farmer, M.D. [facility] Pulmonary/Critical Care Medicine. Pager: [phone]. After hours pager: [phone].   11:48 AM

## 2014-06-11 NOTE — Progress Notes (Signed)
ANTICOAGULATION CONSULT NOTE - Follow Up Consult  Pharmacy Consult for heparin Indication: NSTEMI  Labs:  Recent Labs  06/09/14 0255  06/10/14 0246 06/10/14 0856 06/10/14 2050 06/11/14 0306  HGB 11.4*  --  12.8*  --   --  12.7*  HCT 34.1*  --  39.6  --   --  38.7*  PLT 436*  --  536*  --   --  642*  HEPARINUNFRC 0.46  --  0.65  --   --  0.94*  CREATININE 0.67  < > 1.02 1.02 1.12  --   < > = values in this interval not displayed.   Assessment: 68yo male now supratherapeutic on heparin after three levels at goal though had been trending up, lab drawn correctly per RN.  Goal of Therapy:  Heparin level 0.3-0.7 units/ml   Plan:  Will decrease heparin gtt by 1-2 units/kg/hr to 1300 units/hr and check level in 6hr.  Wynona Neat, PharmD, BCPS  06/11/2014,3:55 AM

## 2014-06-11 NOTE — Procedures (Signed)
Extubation Procedure Note  Patient Details:   Name: Roy [name] DOB: 1946-05-10 MRN: [id number]   Airway Documentation:     Evaluation  O2 sats: stable throughout Complications: No apparent complications Patient did tolerate procedure well. Bilateral Breath Sounds: Clear Suctioning: Airway Yes Pt extubated per MD order. Pt extubated to a 4lpm [place]. Vitals as documented.   Cordella Register 06/11/2014, 12:12 PM

## 2014-06-11 NOTE — Progress Notes (Addendum)
ANTICOAGULATION CONSULT NOTE - Follow Up Consult  Pharmacy Consult for heparin Indication: NSTEMI  Labs:  Recent Labs  06/09/14 0255  06/10/14 0246  06/10/14 2050 06/11/14 0306 06/11/14 0940  HGB 11.4*  --  12.8*  --   --  12.7*  --   HCT 34.1*  --  39.6  --   --  38.7*  --   PLT 436*  --  536*  --   --  642*  --   HEPARINUNFRC 0.46  --  0.65  --   --  0.94* 0.94*  CREATININE 0.67  < > 1.02  < > 1.12 1.18 1.14  < > = values in this interval not displayed.   Assessment: 68yo male now supratherapeutic on heparin after three levels at goal though had been trending up. HL today of 0.94 on 1300 units/hour. No s/sx of bleeding.  Goal of Therapy:  Heparin level 0.3-0.7 units/ml   Plan:  Will decrease heparin gtt by 2 units/kg/hr to 1150 units/hr and check level in 6hr.  Megan E. Supple, Pharm.D Clinical Pharmacy Resident Pager: [phone] 06/11/2014 11:58 AM  Addendum: Heparin level 0.68 after rate decreased to 1150 units/hr. No bleeding noted per chart.  Plan: Continue heparin infusion at current rate and f/u AM labs.  Maryanna Shape, PharmD, BCPS  Clinical Pharmacist  Pager: [phone]

## 2014-06-11 NOTE — Evaluation (Signed)
Clinical/Bedside Swallow Evaluation Patient Details  Name: Roy [name] MRN: [id number] Date of Birth: May 11, 1946  Today's Date: 06/11/2014 Time: 1630-1640 SLP Time Calculation (min): 10 min  Past Medical History:  Past Medical History  Diagnosis Date  . Cerebrovascular disease 2009    TIA; 2009- right ICA stent; re-intervention for restenosis complicated by [facility] w/o sx  . Degenerative joint disease     Total shoulder arthroplasty-right  . LBBB (left bundle branch block)     Normal echo-2011; stress nuclear in 09/2010--septal hypoperfusion representing nontransmural infarction or the effect of left bundle branch block, no ischemia  . Hyperlipidemia   . Hypertension   . Chronic obstructive pulmonary disease   . Tobacco abuse     -100 pack years; 1.5 packs per day  . Anxiety and depression   . Alcohol abuse     6 beers per day; hospital admission in 2009 for withdrawal symptoms  . Thrombocytopenia   . Tubular adenoma of colon    Past Surgical History:  Past Surgical History  Procedure Laterality Date  . Lumbar fusion  2010  . Vasectomy  1971  . Colonoscopy w/ polypectomy  2011  . Total shoulder arthroplasty  2011    Right-Dr. Tamera Punt  . Colonoscopy  08/22/09    Fields-(Tubular Adenoma)3-mm transverse polyp/4-mm polyp otherwise noraml/small internal hemorrhoids  . Esophagogastroduodenoscopy [name] 03/05/2014    Procedure: ESOPHAGOGASTRODUODENOSCOPY (EGD);  Surgeon: Danie Binder, MD;  Location: [facility];  Service: Endoscopy;  Laterality: [name];  11:45  . Agile capsule [name] 03/18/2014    Procedure: AGILE CAPSULE;  Surgeon: Danie Binder, MD;  Location: [facility];  Service: Endoscopy;  Laterality: [name];  7:30  . Givens capsule study [name] 03/30/2014    Procedure: GIVENS CAPSULE STUDY;  Surgeon: Danie Binder, MD;  Location: [facility];  Service: Endoscopy;  Laterality: [name];  7:30  . Colonoscopy [name] 04/14/2014    Procedure: COLONOSCOPY;  Surgeon: Danie Binder, MD;   Location: [facility];  Service: Endoscopy;  Laterality: [name];  9:30   HPI:   68 year old male, current smoker with PMH of COPD, alcohol abuse, chronic bronchitis, TIA, HTN, LBBB who presented to [facility] ED via EMS 7/9 for progressively worsening SOB and respiratory distress. Transferred to [facility].  Intubated 7/10-7/17. NSTEMI.     Assessment / Plan / Recommendation Clinical Impression  Pt presents with a likely acute reversible dysphagia s/p prolonged intubation.  Exhibits multiple s/s of dysphagia with risk for aspiration at this time.  However, appears to be able to tolerate limited amounts of puree safely.  Recommend continuing NPO except allowing meds to be given in puree.  F/u next date for improved readiness.  Discussed with RN and pt, who presents with delayed responses, initiation, and impaired processing.    Aspiration Risk  Moderate    Diet Recommendation NPO except meds   Medication Administration: Whole meds with puree Postural Changes and/or Swallow Maneuvers: Seated upright 90 degrees    Other  Recommendations Oral Care Recommendations:  (oral care QID)   Follow Up Recommendations   (tba)    Frequency and Duration min 2x/week  1 week            Swallow Study Prior Functional Status       General Date of Onset: 06/03/14 HPI:  68 year old male, current smoker with PMH of COPD, alcohol abuse, chronic bronchitis, TIA, HTN, LBBB who presented to [facility] ED via EMS 7/9 for progressively worsening SOB and respiratory  distress. Transferred to [facility].  Intubated 7/10-7/17. NSTEMI.   Type of Study: Bedside swallow evaluation Previous Swallow Assessment: none per records Diet Prior to this Study: NPO Temperature Spikes Noted: No Respiratory Status: Nasal cannula History of Recent Intubation: Yes Length of Intubations (days): 7 days Date extubated: 06/11/14 Behavior/Cognition: Alert;Distractible;Requires cueing Oral Cavity - Dentition: Edentulous Self-Feeding Abilities: Needs  assist Patient Positioning: Upright in bed Baseline Vocal Quality: Breathy;Hoarse;Low vocal intensity Volitional Cough: Weak Volitional Swallow: Able to elicit    Oral/Motor/Sensory Function Overall Oral Motor/Sensory Function: Appears within functional limits for tasks assessed   Ice Chips Ice chips: Impaired Presentation: Spoon Pharyngeal Phase Impairments: Suspected delayed Swallow;Wet Vocal Quality;Decreased hyoid-laryngeal movement;Throat Clearing - Immediate;Cough - Immediate   Thin Liquid Thin Liquid: Impaired Presentation: Cup;Spoon Pharyngeal  Phase Impairments: Suspected delayed Swallow;Decreased hyoid-laryngeal movement;Multiple swallows;Wet Vocal Quality;Throat Clearing - Immediate;Cough - Immediate    Nectar Thick Nectar Thick Liquid: Not tested   Honey Thick Honey Thick Liquid: Not tested   Puree Puree: Impaired Presentation: Spoon Pharyngeal Phase Impairments: Suspected delayed Swallow;Decreased hyoid-laryngeal movement;Multiple swallows   Solid   [name], [place] CCC/SLP Pager [phone]     Solid: Not tested       [name] 06/11/2014,4:56 PM

## 2014-06-12 DIAGNOSIS — I5023 Acute on chronic systolic (congestive) heart failure: Secondary | ICD-10-CM

## 2014-06-12 DIAGNOSIS — R079 Chest pain, unspecified: Secondary | ICD-10-CM

## 2014-06-12 DIAGNOSIS — I509 Heart failure, unspecified: Secondary | ICD-10-CM

## 2014-06-12 LAB — GLUCOSE, CAPILLARY
GLUCOSE-CAPILLARY: 176 mg/dL — AB (ref 70–99)
Glucose-Capillary: 152 mg/dL — ABNORMAL HIGH (ref 70–99)
Glucose-Capillary: 137 mg/dL — ABNORMAL HIGH (ref 70–99)
Glucose-Capillary: 120 mg/dL — ABNORMAL HIGH (ref 70–99)

## 2014-06-12 LAB — MAGNESIUM: Magnesium: 3.3 mg/dL — ABNORMAL HIGH (ref 1.5–2.5)

## 2014-06-12 LAB — BASIC METABOLIC PANEL
ANION GAP: 22 — AB (ref 5–15)
BUN: 110 mg/dL — ABNORMAL HIGH (ref 6–23)
CO2: 31 meq/L (ref 19–32)
CREATININE: 1.14 mg/dL (ref 0.50–1.35)
Calcium: 11 mg/dL — ABNORMAL HIGH (ref 8.4–10.5)
Chloride: 94 mEq/L — ABNORMAL LOW (ref 96–112)
GFR calc Af Amer: 75 mL/min — ABNORMAL LOW (ref >90)
GFR calc non Af Amer: 65 mL/min — ABNORMAL LOW (ref >90)
Glucose, Bld: 146 mg/dL — ABNORMAL HIGH (ref 70–99)
Potassium: 3.6 mEq/L — ABNORMAL LOW (ref 3.7–5.3)
Sodium: 147 mEq/L (ref 137–147)

## 2014-06-12 LAB — CBC
HCT: 42 % (ref 39.0–52.0)
HEMOGLOBIN: 13.5 g/dL (ref 13.0–17.0)
MCH: 29.2 pg (ref 26.0–34.0)
MCHC: 32.1 g/dL (ref 30.0–36.0)
MCV: 90.7 fL (ref 78.0–100.0)
PLATELETS: 699 10*3/uL — AB (ref 150–400)
RBC: 4.63 MIL/uL (ref 4.22–5.81)
RDW: 15.3 % (ref 11.5–15.5)
WBC: 13.7 10*3/uL — ABNORMAL HIGH (ref 4.0–10.5)

## 2014-06-12 LAB — HEPARIN LEVEL (UNFRACTIONATED)
HEPARIN UNFRACTIONATED: 0.85 [IU]/mL — AB (ref 0.30–0.70)
Heparin Unfractionated: 0.85 IU/mL — ABNORMAL HIGH (ref 0.30–0.70)

## 2014-06-12 LAB — PHOSPHORUS: Phosphorus: 5.5 mg/dL — ABNORMAL HIGH (ref 2.3–4.6)

## 2014-06-12 MED ORDER — HEPARIN SODIUM (PORCINE) 5000 UNIT/ML IJ SOLN
5000.0000 [IU] | Freq: Three times a day (TID) | INTRAMUSCULAR | Status: DC
  Administered 2014-06-12 – 2014-06-22 (×30): 5000 [IU] via SUBCUTANEOUS
  Filled 2014-06-12 (×33): qty 1

## 2014-06-12 MED ORDER — IPRATROPIUM-ALBUTEROL 0.5-2.5 (3) MG/3ML IN SOLN: 3.0000 mL | RESPIRATORY_TRACT | Status: DC | PRN

## 2014-06-12 MED ORDER — IPRATROPIUM-ALBUTEROL 0.5-2.5 (3) MG/3ML IN SOLN
3.0000 mL | Freq: Three times a day (TID) | RESPIRATORY_TRACT | Status: DC
  Administered 2014-06-13 – 2014-06-15 (×6): 3 mL via RESPIRATORY_TRACT
  Filled 2014-06-12 (×7): qty 3

## 2014-06-12 MED ORDER — LOSARTAN POTASSIUM 25 MG PO TABS
25.0000 mg | ORAL_TABLET | Freq: Every day | ORAL | Status: DC
  Administered 2014-06-12 – 2014-06-14 (×2): 25 mg via ORAL
  Filled 2014-06-12 (×3): qty 1

## 2014-06-12 MED ORDER — POTASSIUM CHLORIDE CRYS ER 20 MEQ PO TBCR
40.0000 meq | EXTENDED_RELEASE_TABLET | Freq: Once | ORAL | Status: AC
  Administered 2014-06-12: 40 meq via ORAL
  Filled 2014-06-12: qty 2

## 2014-06-12 NOTE — Progress Notes (Signed)
ANTICOAGULATION CONSULT NOTE - Follow Up Consult  Pharmacy Consult for heparin Indication: NSTEMI  Labs:  Recent Labs  06/10/14 0246  06/10/14 2050 06/11/14 0306 06/11/14 0940 06/11/14 1938 06/12/14 0311  HGB 12.8*  --   --  12.7*  --   --  13.5  HCT 39.6  --   --  38.7*  --   --  42.0  PLT 536*  --   --  642*  --   --  699*  HEPARINUNFRC 0.65  --   --  0.94* 0.94* 0.68 0.85*  CREATININE 1.02  < > 1.12 1.18 1.14  --   --   < > = values in this interval not displayed.   Assessment: 68yo male now supratherapeutic on heparin after three levels at goal, apparently accumulating.  Goal of Therapy:  Heparin level 0.3-0.7 units/ml   Plan:  Will decrease heparin gtt by 3 units/kg/hr to 1000 units/hr and check level in 6hr.  Wynona Neat, PharmD, BCPS  06/12/2014,4:41 AM

## 2014-06-12 NOTE — Progress Notes (Signed)
Spoke with dr. Elsworth Soho regarding pt bp, md updated parameter on bp meds, stated ok to transfer pt.

## 2014-06-12 NOTE — Evaluation (Signed)
Physical Therapy Evaluation Patient Details Name: Roy [name] MRN: [id number] DOB: [birth year] Today's Date: 06/12/2014   History of Present Illness  Patient is a 68 yo male admitted 06/03/14 with respiratory failure, CHF/COPD, NSTEMI, acute encephalopaty (? ETOH w/d per chart).  Patient intubated 06/04/14, and extubated 06/11/14.  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   Clinical Impression  Patient presents with problems listed below.  Will benefit from acute PT to maximize independence prior to discharge.  Per chart, patient to discharge to [facility] - will have f/u PT in that setting.    Follow Up Recommendations LTACH;Supervision/Assistance - 24 hour    Equipment Recommendations  Rolling walker with 5" wheels    Recommendations for Other Services       Precautions / Restrictions Precautions Precautions: Fall Restrictions Weight Bearing Restrictions: No      Mobility  Bed Mobility Overal bed mobility: Needs Assistance;+2 for physical assistance Bed Mobility: Rolling;Sidelying to Sit;Sit to Supine Rolling: Mod assist Sidelying to sit: Mod assist;+2 for physical assistance   Sit to supine: Mod assist;+2 for physical assistance   General bed mobility comments: Verbal and tactile cues for technique.  Assist to initiate movement.  Assist to bring LE's off of bed and to raise trunk to sitting.  Once sitting, patient required min assist for balance.  Sat EOB x 8 minutes.  Returned to supine with +2 assist due to BM.  Transfers                    Ambulation/Gait                Stairs            Wheelchair Mobility    Modified Rankin (Stroke Patients Only)       Balance Overall balance assessment: Needs assistance Sitting-balance support: Single extremity supported;Feet supported Sitting balance-Leahy Scale: Poor Sitting balance - Comments: Patient with  flexed posture, leaning posteriorly and to right.  Able to maintain balance with min guard assist for 20 seconds.  Needed frequent redirection to task. Postural control: Right lateral lean;Posterior lean                                   Pertinent Vitals/Pain     Home Living Family/patient expects to be discharged to:: Other (Comment) (LTACH per CM note) Living Arrangements: Spouse/significant other               Additional Comments: Patient unable to provide information regarding living situation.    Prior Function Level of Independence:  (Unsure)         Comments: Patient unable to provide PLOF.  Per chart, patient has a cane and w/c at home.     Hand Dominance        Extremity/Trunk Assessment   Upper Extremity Assessment: Overall WFL for tasks assessed (Mits in place)           Lower Extremity Assessment: Generalized weakness      Cervical / Trunk Assessment: Kyphotic  Communication   Communication: Receptive difficulties;Expressive difficulties  Cognition Arousal/Alertness: Awake/alert Behavior During Therapy: Anxious;Flat affect;Impulsive Overall Cognitive Status: Difficult to assess Area of Impairment: Following commands;Safety/judgement;Orientation;Attention;Problem solving Orientation Level: Disoriented to;Place;Situation;Time Current Attention Level: Focused   Following Commands: Follows one step commands inconsistently Safety/Judgement: Decreased awareness of safety   Problem Solving:  Slow processing;Difficulty sequencing;Requires verbal cues;Requires tactile cues      General Comments      Exercises        Assessment/Plan    PT Assessment Patient needs continued PT services  PT Diagnosis Difficulty walking;Generalized weakness;Altered mental status   PT Problem List Decreased strength;Decreased activity tolerance;Decreased balance;Decreased mobility;Decreased cognition;Decreased knowledge of use of DME;Decreased  safety awareness;Cardiopulmonary status limiting activity  PT Treatment Interventions DME instruction;Gait training;Functional mobility training;Therapeutic exercise;Balance training;Cognitive remediation;Patient/family education   PT Goals (Current goals can be found in the Care Plan section) Acute Rehab PT Goals Patient Stated Goal: None stated  PT Goal Formulation: With patient Time For Goal Achievement: 06/26/14 Potential to Achieve Goals: Good    Frequency Min 3X/week   Barriers to discharge        Co-evaluation               End of Session Equipment Utilized During Treatment: Oxygen Activity Tolerance: Patient limited by fatigue Patient left: in bed;with call bell/phone within reach;with bed alarm set;with restraints reapplied Nurse Communication:  (Needed to be cleaned)         Time: 7846-9629 PT Time Calculation (min): 21 min   Charges:   PT Evaluation $Initial PT Evaluation Tier I: 1 Procedure PT Treatments $Therapeutic Activity: 8-22 mins   PT G Codes:          Despina Pole 06/12/2014, 9:44 AM Carita Pian. Sanjuana Kava, [place] Pager [phone]

## 2014-06-12 NOTE — Progress Notes (Signed)
Subjective: Patient denies SOB or CP  Objective: Filed Vitals:   06/12/14 1000 06/12/14 1100 06/12/14 1150 06/12/14 1200  BP: 96/79 133/68  119/74  Pulse: 97 93  88  Temp:  97.4 F (36.3 C)    TempSrc:  Oral    Resp: 22 19  16   Height:      Weight:      SpO2: 100% 100% 100% 100%   Weight change: 1 lb 1.6 oz (0.5 kg)  Intake/Output Summary (Last 24 hours) at 06/12/14 1251 Last data filed at 06/12/14 1142  Gross per 24 hour  Intake 662.61 ml  Output   1100 ml  Net -437.39 ml    General: Frail 68 yo in NAD  Neck:  JVP is normal Heart: Regular rate and rhythm, without murmurs, rubs, gallops.  Lungs: Clear to auscultation.  No rales or wheezes. Exemities:  No edema.   Neuro: Grossly intact, nonfocal.  Tele:  SR   Lab Results: Results for orders placed during the hospital encounter of 06/03/14 (from the past 24 hour(s))  GLUCOSE, CAPILLARY     Status: Abnormal   Collection Time    06/11/14  4:40 PM      Result Value Ref Range   Glucose-Capillary 123 (*) 70 - 99 mg/dL  HEPARIN LEVEL (UNFRACTIONATED)     Status: None   Collection Time    06/11/14  7:38 PM      Result Value Ref Range   Heparin Unfractionated 0.68  0.30 - 0.70 IU/mL  GLUCOSE, CAPILLARY     Status: Abnormal   Collection Time    06/11/14  7:38 PM      Result Value Ref Range   Glucose-Capillary 129 (*) 70 - 99 mg/dL   Comment 1 Documented in Chart     Comment 2 Notify RN    GLUCOSE, CAPILLARY     Status: Abnormal   Collection Time    06/11/14  9:37 PM      Result Value Ref Range   Glucose-Capillary 123 (*) 70 - 99 mg/dL   Comment 1 Notify RN     Comment 2 Documented in Chart    HEPARIN LEVEL (UNFRACTIONATED)     Status: Abnormal   Collection Time    06/12/14  3:11 AM      Result Value Ref Range   Heparin Unfractionated 0.85 (*) 0.30 - 0.70 IU/mL  CBC     Status: Abnormal   Collection Time    06/12/14  3:11 AM      Result Value Ref Range   WBC 13.7 (*) 4.0 - 10.5 K/uL   RBC 4.63  4.22 - 5.81  MIL/uL   Hemoglobin 13.5  13.0 - 17.0 g/dL   HCT 42.0  39.0 - 52.0 %   MCV 90.7  78.0 - 100.0 fL   MCH 29.2  26.0 - 34.0 pg   MCHC 32.1  30.0 - 36.0 g/dL   RDW 15.3  11.5 - 15.5 %   Platelets 699 (*) 150 - 400 K/uL  BASIC METABOLIC PANEL     Status: Abnormal   Collection Time    06/12/14  3:11 AM      Result Value Ref Range   Sodium 147  137 - 147 mEq/L   Potassium 3.6 (*) 3.7 - 5.3 mEq/L   Chloride 94 (*) 96 - 112 mEq/L   CO2 31  19 - 32 mEq/L   Glucose, Bld 146 (*) 70 - 99 mg/dL   BUN 110 (*)  6 - 23 mg/dL   Creatinine, Ser 1.14  0.50 - 1.35 mg/dL   Calcium 11.0 (*) 8.4 - 10.5 mg/dL   GFR calc non Af Amer 65 (*) >90 mL/min   GFR calc Af Amer 75 (*) >90 mL/min   Anion gap 22 (*) 5 - 15  MAGNESIUM     Status: Abnormal   Collection Time    06/12/14  3:11 AM      Result Value Ref Range   Magnesium 3.3 (*) 1.5 - 2.5 mg/dL  PHOSPHORUS     Status: Abnormal   Collection Time    06/12/14  3:11 AM      Result Value Ref Range   Phosphorus 5.5 (*) 2.3 - 4.6 mg/dL  GLUCOSE, CAPILLARY     Status: Abnormal   Collection Time    06/12/14 11:07 AM      Result Value Ref Range   Glucose-Capillary 176 (*) 70 - 99 mg/dL   Comment 1 Documented in Chart     Comment 2 Notify RN    HEPARIN LEVEL (UNFRACTIONATED)     Status: Abnormal   Collection Time    06/12/14 11:15 AM      Result Value Ref Range   Heparin Unfractionated 0.85 (*) 0.30 - 0.70 IU/mL    Studies/Results: Dg Chest Port 1 View  06/11/2014   CLINICAL DATA:  Respiratory distress.  Intubated patient.  EXAM: PORTABLE CHEST - 1 VIEW  COMPARISON:  Single view of the chest 06/10/2014 and 06/09/2014.  FINDINGS: ET tube remains in place and projects in good position. NG tube is seen with the side port at the gastroesophageal junction. Recommend advancement of 4-5 cm. Lungs are clear. Heart size is normal. No pneumothorax or pleural effusion. Right shoulder replacement noted.  IMPRESSION: Side port of the NG tube is at the gastroesophageal  junction. Recommend advancement 4-5 cm.  ET tube in good position.  Lungs clear.   Electronically Signed   By: Inge Rise M.D.   On: 06/11/2014 08:00    Medications: REviewed   @PROBHOSP @  Patient is now extubated.  Echo this admit with LVEF 20% with diffuse hypokinesis.  He had chest pain prior to admit  Ruled in for MI.  Now that resp improved he shoud be set up for R and L heart catheterization. WIll plan for Monday  2  Acute on chronic systolic CHF  Volume status looks pretty good  Echo on 7/10 showed restrictive physiology Will add low dose ARB.    3.  COPD  CCM following  4.  PVOD  5.  HL  Keep on statin.        LOS: 9 days   Dorris Carnes 06/12/2014, 12:51 PM

## 2014-06-12 NOTE — Progress Notes (Signed)
Ccm paged spoke with dr. Elsworth Soho, pt pb is "soft( please see flow sheet for vitals) pt is to transfer to floor, dr. Elsworth Soho aware of bp, stated to hold Metoprol at this time, watch pt for few hours and will reassess in few hours. Will continue to monitor pt and bp.

## 2014-06-12 NOTE — Progress Notes (Signed)
SLP Cancellation Note  Patient Details Name: MYRL LAZARUS MRN: [id number] DOB: 08-08-1946   Cancelled treatment: Attempt x1 to complete dysphagia treatment to determine PO readiness.  Treatment not completed as patient with RT.  Per RN patient to transfer rooms.  ST to re-attempt this date.   Sharman Crate Paia, [place]  [facility] 06/12/2014, 4:27 PM

## 2014-06-13 DIAGNOSIS — I10 Essential (primary) hypertension: Secondary | ICD-10-CM

## 2014-06-13 DIAGNOSIS — R404 Transient alteration of awareness: Secondary | ICD-10-CM

## 2014-06-13 LAB — BASIC METABOLIC PANEL
ANION GAP: 31 — AB (ref 5–15)
BUN: 161 mg/dL — ABNORMAL HIGH (ref 6–23)
CALCIUM: 10.3 mg/dL (ref 8.4–10.5)
CO2: 22 mEq/L (ref 19–32)
CREATININE: 3.96 mg/dL — AB (ref 0.50–1.35)
Chloride: 103 mEq/L (ref 96–112)
GFR calc non Af Amer: 14 mL/min — ABNORMAL LOW (ref >90)
GFR, EST AFRICAN AMERICAN: 17 mL/min — AB (ref >90)
Glucose, Bld: 129 mg/dL — ABNORMAL HIGH (ref 70–99)
Potassium: 3.8 mEq/L (ref 3.7–5.3)
SODIUM: 156 meq/L — AB (ref 137–147)

## 2014-06-13 LAB — CBC
HCT: 39.1 % (ref 39.0–52.0)
HEMOGLOBIN: 12.7 g/dL — AB (ref 13.0–17.0)
MCH: 29.1 pg (ref 26.0–34.0)
MCHC: 32.5 g/dL (ref 30.0–36.0)
MCV: 89.7 fL (ref 78.0–100.0)
PLATELETS: 791 10*3/uL — AB (ref 150–400)
RBC: 4.36 MIL/uL (ref 4.22–5.81)
RDW: 15.5 % (ref 11.5–15.5)
WBC: 16.5 10*3/uL — ABNORMAL HIGH (ref 4.0–10.5)

## 2014-06-13 LAB — GLUCOSE, CAPILLARY
GLUCOSE-CAPILLARY: 129 mg/dL — AB (ref 70–99)
Glucose-Capillary: 137 mg/dL — ABNORMAL HIGH (ref 70–99)
Glucose-Capillary: 109 mg/dL — ABNORMAL HIGH (ref 70–99)
Glucose-Capillary: 122 mg/dL — ABNORMAL HIGH (ref 70–99)
Glucose-Capillary: 125 mg/dL — ABNORMAL HIGH (ref 70–99)

## 2014-06-13 MED ORDER — RISPERIDONE 1 MG PO TABS
1.0000 mg | ORAL_TABLET | Freq: Every day | ORAL | Status: DC
  Administered 2014-06-13 – 2014-06-21 (×9): 1 mg via ORAL
  Filled 2014-06-13 (×13): qty 1

## 2014-06-13 MED ORDER — SODIUM CHLORIDE 0.9 % IV SOLN
INTRAVENOUS | Status: DC
Start: 2014-06-13 — End: 2014-06-15
  Administered 2014-06-13: 18:00:00 via INTRAVENOUS

## 2014-06-13 MED ORDER — SODIUM CHLORIDE 0.9 % IV BOLUS (SEPSIS)
250.0000 mL | INTRAVENOUS | Status: AC
  Administered 2014-06-13: 250 mL via INTRAVENOUS

## 2014-06-13 MED ORDER — SODIUM CHLORIDE 0.9 % IV BOLUS (SEPSIS)
500.0000 mL | Freq: Once | INTRAVENOUS | Status: AC
  Administered 2014-06-13: 500 mL via INTRAVENOUS

## 2014-06-13 MED ORDER — RISPERIDONE 0.5 MG PO TABS
0.5000 mg | ORAL_TABLET | Freq: Every morning | ORAL | Status: DC
  Administered 2014-06-13 – 2014-06-22 (×10): 0.5 mg via ORAL
  Filled 2014-06-13 (×10): qty 1

## 2014-06-13 NOTE — Progress Notes (Signed)
Dr Oliver Pila  notified of lab results orders given report called to nurse on [place]. Patient transferred and stable.

## 2014-06-13 NOTE — Progress Notes (Addendum)
PULMONARY / CRITICAL CARE MEDICINE  Name: Roy [name] MRN: [id number] DOB: 24-Jul-1946    ADMISSION DATE:  06/03/2014 CONSULTATION DATE:  06/03/2014  REFERRING MD :  Lacinda Axon ([facility]) PRIMARY SERVICE: PCCM  CHIEF COMPLAINT:  Respiratory failure  BRIEF PATIENT DESCRIPTION:  68 yo active smoker with COPD admitted 7/9 with pneumonia and hypoxemic respiratro failure  SIGNIFICANT EVENTS / STUDIES:  7/9    Transferred to [facility] 7/10  TTE >>> LVEF 20%, physiology suggestive of restriction, LAE 7/12  Intubated 7/17  Extubated  LINES / TUBES: ETT 7/10 >>> 7/17 R IJ TLC 7/10 >>> ??? Foley 7/10 >>>  CULTURES: 7/9 Blood >>> neg 7/9 Urine >>> neg 7/9 MRSA by PCR >>> neg  ANTIBIOTICS: Azithromycin 7/9 >>> 7/13 Ceftriaxone 7/9 >>>  INTERVAL HISTORY:  Derlirium  VITAL SIGNS: Temp:  [97.3 F (36.3 C)-98.6 F (37 C)] 97.5 F (36.4 C) (07/19 1100) Pulse Rate:  [39-103] 39 (07/19 1100) Resp:  [11-41] 31 (07/19 1100) BP: (51-133)/(27-79) 84/27 mmHg (07/19 1100) SpO2:  [84 %-100 %] 100 % (07/19 1100) Weight:  [59.2 kg (130 lb 8.2 oz)] 59.2 kg (130 lb 8.2 oz) (07/19 0500)  HEMODYNAMICS:   VENTILATOR SETTINGS:   INTAKE / OUTPUT: Intake/Output     07/18 0701 - 07/19 0700 07/19 0701 - 07/20 0700   I.V. (mL/kg) 47 (0.8)    IV Piggyback     Total Intake(mL/kg) 47 (0.8)    Urine (mL/kg/hr) 250 (0.2)    Total Output 250     Net -203          Urine Occurrence 2 x    Stool Occurrence 7 x     PHYSICAL EXAMINATION: Gen: No distress HEENT: Moist membranes PULM: Bilateral air entry, few rales CV: regular, no murmurs AB: Soft, bowel sounds present Ext: Moves all extremities Derm: Skin breakdown perineal area  Neuro:  Awake, does not follow commands  LABS: CBC  Recent Labs Lab 06/11/14 0306 06/12/14 0311 06/13/14 0220  WBC 18.2* 13.7* 16.5*  HGB 12.7* 13.5 12.7*  HCT 38.7* 42.0 39.1  PLT 642* 699* 791*   Coag's No results found for this basename: APTT, INR,  in the last 168  hours  BMET  Recent Labs Lab 06/11/14 0940 06/12/14 0311 06/13/14 1307  NA 145 147 156*  K 3.4* 3.6* 3.8  CL 92* 94* 103  CO2 29 31 22   BUN 106* 110* 161*  CREATININE 1.14 1.14 3.96*  GLUCOSE 165* 146* 129*   Electrolytes  Recent Labs Lab 06/10/14 0246  06/11/14 0306 06/11/14 0940 06/12/14 0311  CALCIUM 10.6*  < > 10.6* 10.7* 11.0*  MG 2.6*  --  3.0*  --  3.3*  PHOS 7.1*  --  6.4*  --  5.5*  < > = values in this interval not displayed.  Sepsis Markers  Recent Labs Lab 06/06/14 2350  LATICACIDVEN 0.9   ABG  Recent Labs Lab 06/09/14 0500 06/10/14 0415 06/11/14 0305  PHART 7.502* 7.481* 7.432  PCO2ART 39.0 41.3 45.8*  PO2ART 87.0 96.6 103.0*   Liver Enzymes No results found for this basename: AST, ALT, ALKPHOS, BILITOT, ALBUMIN,  in the last 168 hours  Cardiac Enzymes  Recent Labs Lab 06/07/14 0239  TROPONINI 4.84*  PROBNP 4105.0*   Glucose  Recent Labs Lab 06/12/14 0753 06/12/14 1107 06/12/14 1710 06/12/14 1936 06/13/14 0805 06/13/14 1134  GLUCAP 152* 176* 137* 120* 137* 129*   IMAGING:  No results found. 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 P:  Trend BMP NS 250 + additional 500 Start NS @ 100  GASTROINTESTINAL A: Dysphagia GI Px is not indicate P: NPO except Rx SLP eval in progress Modified barium swallow  HEMATOLOGIC A: VTE px Heparinization for ACS P:  Trend CBC Heparin [place]  INFECTIOUS A: CAP P:   D/c  Ceftriaxone, completed 10 days  ENDOCRINE A: No acute issues  P:   SSI  NEUROLOGIC A: Acute encephalopathy / delirium - uremic? Possible alcohol withdrawal Anxiety, depression P:  Risperdal 0.5 mg in qam and 1 mg qhs Thiamine / Folate PT / OT Sitter for safety / agitation.  I have personally obtained history, examined patient, evaluated and interpreted laboratory and imaging results, reviewed medical records, formulated assessment / plan and placed orders.  Doree Fudge, MD Pulmonary and [place] Pager: [phone]  06/13/2014, 12:20 PM

## 2014-06-13 NOTE — Progress Notes (Signed)
Subjective: Denies CP  Breathing is OK   Objective: Filed Vitals:   06/13/14 0400 06/13/14 0500 06/13/14 0600 06/13/14 0700  BP: 101/56 107/78 82/59 107/69  Pulse:  83 88 96  Temp:  97.3 F (36.3 C)    TempSrc:  Oral    Resp: 29 22 16 31   Height:      Weight:  130 lb 8.2 oz (59.2 kg)    SpO2:  95% 95% 100%   Weight change: -7 lb 0.9 oz (-3.2 kg)  Intake/Output Summary (Last 24 hours) at 06/13/14 0730 Last data filed at 06/12/14 1537  Gross per 24 hour  Intake     47 ml  Output    250 ml  Net   -203 ml    General: Alert, awake, answers questions.   Neck:  JVP is normal Heart: Regular rate and rhythm, without murmurs, rubs, gallops.  Lungs: Clear to auscultation.  No rales or wheezes. Exemities:  No edema.   Neuro: Grossly intact, nonfocal.  Tele:  SR Lab Results: Results for orders placed during the hospital encounter of 06/03/14 (from the past 24 hour(s))  GLUCOSE, CAPILLARY     Status: Abnormal   Collection Time    06/12/14  7:53 AM      Result Value Ref Range   Glucose-Capillary 152 (*) 70 - 99 mg/dL  GLUCOSE, CAPILLARY     Status: Abnormal   Collection Time    06/12/14 11:07 AM      Result Value Ref Range   Glucose-Capillary 176 (*) 70 - 99 mg/dL   Comment 1 Documented in Chart     Comment 2 Notify RN    HEPARIN LEVEL (UNFRACTIONATED)     Status: Abnormal   Collection Time    06/12/14 11:15 AM      Result Value Ref Range   Heparin Unfractionated 0.85 (*) 0.30 - 0.70 IU/mL  GLUCOSE, CAPILLARY     Status: Abnormal   Collection Time    06/12/14  5:10 PM      Result Value Ref Range   Glucose-Capillary 137 (*) 70 - 99 mg/dL  GLUCOSE, CAPILLARY     Status: Abnormal   Collection Time    06/12/14  7:36 PM      Result Value Ref Range   Glucose-Capillary 120 (*) 70 - 99 mg/dL   Comment 1 Documented in Chart     Comment 2 Notify RN    CBC     Status: Abnormal   Collection Time    06/13/14  2:20 AM      Result Value Ref Range   WBC 16.5 (*) 4.0 - 10.5 K/uL    RBC 4.36  4.22 - 5.81 MIL/uL   Hemoglobin 12.7 (*) 13.0 - 17.0 g/dL   HCT 39.1  39.0 - 52.0 %   MCV 89.7  78.0 - 100.0 fL   MCH 29.1  26.0 - 34.0 pg   MCHC 32.5  30.0 - 36.0 g/dL   RDW 15.5  11.5 - 15.5 %   Platelets 791 (*) 150 - 400 K/uL    Studies/Results: No results found.  Medications: Reviewed   @PROBHOSP @  1  Acute on chronic CHF  LVEF 20%  Hx CP prior to admit.  Plan for R and L heart cath in AM  Echo on 7/10 showed restrictive physiology  Will add low dose ARB.   3. COPD CCM following   4. PVOD   5. HL Keep on statin.  LOS: 10 days   Dorris Carnes 06/13/2014, 7:30 AM

## 2014-06-13 NOTE — Progress Notes (Signed)
Speech Language Pathology Treatment: Dysphagia  Patient Details Name: Roy [name] MRN: [id number] DOB: 21-May-1946 Today's Date: 06/13/2014 Time: 5397-6734 SLP Time Calculation (min): 15 min  Assessment / Plan / Recommendation Clinical Impression  F/u from BSE to determine PO readiness.  Continued oropharyngeal dysphagia indicated with continued +s/s of aspiration with all liquid consistencies.  Delayed s/s of suspected penetration with puree with swallows in succession.  Tolerated puree/ice chips in smaller amounts.  Recommend to continue NPO status with exception of ice chips PRN s/p oral care  and medication crushed and administered in puree.  Recommend to proceed with objective evaluation of MBS to assess risk for aspiration and determine safest, PO diet.  MBS to be completed on 06/14/14.   HPI HPI: 68 year old male, current smoker with PMH of COPD, alcohol abuse, chronic bronchitis, TIA, HTN, LBBB who presented to [facility] ED via EMS 7/9 for progressively worsening SOB and respiratory distress. Transferred to [facility].  Intubated 7/10-7/17. NSTEMI      SLP Plan  Continue with current plan of care    Recommendations Diet recommendations: NPO with exception of ice chips PRN  Medication Administration: Crushed with puree Postural Changes and/or Swallow Maneuvers: Seated upright 90 degrees              Oral Care Recommendations: Oral care Q4 per protocol;Oral care prior to ice chips Follow up Recommendations: Skilled Nursing facility Plan: Continue with current plan of care    [place], [place] [facility] 06/13/2014, 9:32 AM

## 2014-06-13 NOTE — Progress Notes (Signed)
Spoke to nursing.  Patinet has been fidgety today  Not cooperative  Not answering questions  Legs over legrail  I do not think he would be a candidate for cath (or even nuclear then) if this continues  If no change in behaviior by am would recomm continued medical Rx.  WOuld not be able to pursue an ischemic evaluation.   For now will remove from cath schedule.

## 2014-06-14 ENCOUNTER — Inpatient Hospital Stay (HOSPITAL_COMMUNITY): Payer: Medicare Other

## 2014-06-14 ENCOUNTER — Encounter (HOSPITAL_COMMUNITY)
Admission: EM | Disposition: A | Discharge status: Skilled Nursing Facility | Payer: Self-pay | Source: Home / Self Care | Attending: Internal Medicine

## 2014-06-14 DIAGNOSIS — N179 Acute kidney failure, unspecified: Secondary | ICD-10-CM

## 2014-06-14 LAB — BASIC METABOLIC PANEL
ANION GAP: 27 — AB (ref 5–15)
ANION GAP: 23 — AB (ref 5–15)
BUN: 177 mg/dL — ABNORMAL HIGH (ref 6–23)
BUN: 184 mg/dL — ABNORMAL HIGH (ref 6–23)
CO2: 22 mEq/L (ref 19–32)
CO2: 20 mEq/L (ref 19–32)
Calcium: 9.9 mg/dL (ref 8.4–10.5)
Calcium: 9.9 mg/dL (ref 8.4–10.5)
Chloride: 107 mEq/L (ref 96–112)
Chloride: 115 mEq/L — ABNORMAL HIGH (ref 96–112)
Creatinine, Ser: 4.6 mg/dL — ABNORMAL HIGH (ref 0.50–1.35)
Creatinine, Ser: 4.96 mg/dL — ABNORMAL HIGH (ref 0.50–1.35)
GFR calc non Af Amer: 11 mL/min — ABNORMAL LOW (ref >90)
GFR, EST AFRICAN AMERICAN: 14 mL/min — AB (ref >90)
GFR, EST AFRICAN AMERICAN: 13 mL/min — AB (ref >90)
GFR, EST NON AFRICAN AMERICAN: 12 mL/min — AB (ref >90)
Glucose, Bld: 152 mg/dL — ABNORMAL HIGH (ref 70–99)
Glucose, Bld: 130 mg/dL — ABNORMAL HIGH (ref 70–99)
POTASSIUM: 4.1 meq/L (ref 3.7–5.3)
Potassium: 3.8 mEq/L (ref 3.7–5.3)
SODIUM: 156 meq/L — AB (ref 137–147)
SODIUM: 158 meq/L — AB (ref 137–147)

## 2014-06-14 LAB — MAGNESIUM: MAGNESIUM: 3.6 mg/dL — AB (ref 1.5–2.5)

## 2014-06-14 LAB — CBC
HCT: 38.1 % — ABNORMAL LOW (ref 39.0–52.0)
Hemoglobin: 11.9 g/dL — ABNORMAL LOW (ref 13.0–17.0)
MCH: 28.2 pg (ref 26.0–34.0)
MCHC: 31.2 g/dL (ref 30.0–36.0)
MCV: 90.3 fL (ref 78.0–100.0)
PLATELETS: 581 10*3/uL — AB (ref 150–400)
RBC: 4.22 MIL/uL (ref 4.22–5.81)
RDW: 15.6 % — AB (ref 11.5–15.5)
WBC: 15.5 10*3/uL — ABNORMAL HIGH (ref 4.0–10.5)

## 2014-06-14 LAB — ETHYLENE GLYCOL

## 2014-06-14 LAB — GLUCOSE, CAPILLARY
GLUCOSE-CAPILLARY: 152 mg/dL — AB (ref 70–99)
Glucose-Capillary: 139 mg/dL — ABNORMAL HIGH (ref 70–99)
Glucose-Capillary: 162 mg/dL — ABNORMAL HIGH (ref 70–99)
Glucose-Capillary: 114 mg/dL — ABNORMAL HIGH (ref 70–99)

## 2014-06-14 SURGERY — LEFT AND RIGHT HEART CATHETERIZATION WITH CORONARY ANGIOGRAM
Anesthesia: LOCAL

## 2014-06-14 MED ORDER — SODIUM CHLORIDE 0.9 % IJ SOLN
3.0000 mL | Freq: Two times a day (BID) | INTRAMUSCULAR | Status: DC
  Administered 2014-06-14 – 2014-06-22 (×13): 3 mL via INTRAVENOUS

## 2014-06-14 MED ORDER — RESOURCE THICKENUP CLEAR PO POWD
ORAL | Status: DC | PRN
  Filled 2014-06-14: qty 125

## 2014-06-14 MED ORDER — SODIUM CHLORIDE 0.9 % IJ SOLN
3.0000 mL | INTRAMUSCULAR | Status: DC | PRN
  Administered 2014-06-21: 3 mL via INTRAVENOUS

## 2014-06-14 MED ORDER — SODIUM CHLORIDE 0.45 % IV SOLN
INTRAVENOUS | Status: DC
  Administered 2014-06-14 – 2014-06-15 (×2): via INTRAVENOUS

## 2014-06-14 MED ORDER — SODIUM CHLORIDE 0.9 % IV SOLN: 250.0000 mL | INTRAVENOUS | Status: DC | PRN

## 2014-06-14 MED ORDER — ASPIRIN 81 MG PO CHEW: 81.0000 mg | CHEWABLE_TABLET | ORAL | Status: AC

## 2014-06-14 NOTE — Consult Note (Signed)
Reason for Consult: Acute Kidney Injury Referring Physician: Dr. Nelda Marseille  Chief Complaint: Shortness of breath  Assessment: 1. Acute kidney injury - appears to be volume related although certainly possible he has developed ATN since hospitalization. His MAP has also dropped since ~7/18 with intermittent and sometimes persistent hypotensive episodes.  2. Hypernatremia - related to volume status and patient unlikely to have been consuming much free water. At time of exam he was already confused and if this occurred a few days ago it would be consistent with the patient effectively not being able to keep up with free water loss. Currently his free water deficit is 3.7L. Of note his weight has decreased from 69kg at admission to 58kg which is not consistent with the net fluid I&O's even accounting basal fluid losses. 3. 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 COPD 5. Confusion  Plan 1.   Due to the unavailability of labs over the past 48hrs I am going to treat this as a subacute hypernatremia. Free water repletion with d5w at 174m/hr would result in a Sna of 140 within 24hrs assuming no further free water loss.  2.   Because of his history of CHF with an EF of 20% and subacute history of hypernatremia I will start with 1/2NS at 1249mhr. 3.   Close monitoring of the chemistry panel for the next 24 hrs --> chem panel q6hr x3. 4.   The renal function should improve with hydration as he's still markedly volume down if the weights are to be believed (down 11kg since hospitalization) and down 3.7L by net I&O's. 5.   Will also check a post void bladder scan if possible. 6.   Urine Na and Cr.   HPI: Roy BOSTICs a 68year old male, current smoker (120 pack years) with PMH of COPD, alcohol abuse, chronic bronchitis, TIA, HTN, LBBB who presented with progressively worsening SOB and respiratory distress. According to history patient sat'ing in the 70's at home with progressively worsening SOB adn orthopnea. He also has a  history of bronchitis/COPD.  He is not on  Home O2. Patient was started on CPAP and transitioned to BiPAP in ED. EKG neg for acute MI, elevated troponin, CXR with pulm edema. Since admission he was initially diuresed aggressively with good results but his creatinine bumped from 0.5 at admission to 1.09 on 7/14 and finally to 4.6 at date of consultation. Of note his renal function continued to worsen despite having the diuretics discontinued and also being given fluid. Serum sodium has increased from 130 at time of admission to 140 on 7/14 and finally to 156 at date of consultation. I do not see any contrast administration or NSAID usage on review. There is no history of the patient being dialysis or  seeing a nephrologist in the past. Patient is confused and history obtained from chart and nursing staff.   ROS Review of systems not obtained due to patient factors.  Chemistry and CBC: Creat  Date/Time Value Ref Range Status  03/03/2014  9:57 AM 0.46* 0.50 - 1.35 mg/dL Final  04/16/2012  8:07 AM 0.50   Final  05/10/2011  9:00 AM 0.57  0.50 - 1.35 mg/dL Final     Creatinine, Ser  Date/Time Value Ref Range Status  06/14/2014  3:50 AM 4.60* 0.50 - 1.35 mg/dL Final  06/13/2014  1:07 PM 3.96* 0.50 - 1.35 mg/dL Final     DELTA CHECK NOTED  06/12/2014  3:11 AM 1.14  0.50 - 1.35 mg/dL Final  06/11/2014  9:40 AM 1.14  0.50 - 1.35 mg/dL Final  06/11/2014  3:06 AM 1.18  0.50 - 1.35 mg/dL Final  06/10/2014  8:50 PM 1.12  0.50 - 1.35 mg/dL Final  06/10/2014  8:56 AM 1.02  0.50 - 1.35 mg/dL Final  06/10/2014  2:46 AM 1.02  0.50 - 1.35 mg/dL Final  06/09/2014 10:57 PM 0.98  0.50 - 1.35 mg/dL Final  06/09/2014  9:26 AM 0.73  0.50 - 1.35 mg/dL Final  06/09/2014  2:55 AM 0.67  0.50 - 1.35 mg/dL Final  06/08/2014 10:38 PM 0.63  0.50 - 1.35 mg/dL Final  06/08/2014  9:14 AM 0.43* 0.50 - 1.35 mg/dL Final  06/08/2014  3:31 AM 0.50  0.50 - 1.35 mg/dL Final  06/07/2014  9:13 PM 0.50  0.50 - 1.35 mg/dL Final  06/07/2014  9:50 AM  0.50  0.50 - 1.35 mg/dL Final  06/07/2014  2:39 AM 0.56  0.50 - 1.35 mg/dL Final  06/06/2014  3:13 AM 0.59  0.50 - 1.35 mg/dL Final  06/05/2014  2:23 AM 0.52  0.50 - 1.35 mg/dL Final  06/04/2014  3:22 AM 0.50  0.50 - 1.35 mg/dL Final  06/03/2014  3:00 PM 0.51  0.50 - 1.35 mg/dL Final  06/03/2014 10:11 AM 0.57  0.50 - 1.35 mg/dL Final  05/04/2014  6:23 AM 0.50  0.50 - 1.35 mg/dL Final  02/03/2014  2:30 PM 0.45* 0.50 - 1.35 mg/dL Final  10/10/2012  5:26 AM 0.43* 0.50 - 1.35 mg/dL Final  10/09/2012 12:44 PM 0.43* 0.50 - 1.35 mg/dL Final  01/15/2012  6:41 AM 0.41* 0.50 - 1.35 mg/dL Final  10/19/2010  3:35 AM 0.59  0.4 - 1.5 mg/dL Final  10/18/2010  5:12 AM 0.63  0.4 - 1.5 mg/dL Final  10/13/2010  9:16 AM 0.65  0.4 - 1.5 mg/dL Final  09/25/2010  1:40 PM 0.57  0.4 - 1.5 mg/dL Final  08/08/2010  6:55 AM 0.55  0.4 - 1.5 mg/dL Final  03/31/2010  9:10 AM 0.62  0.4 - 1.5 mg/dL Final  09/30/2009  6:25 AM 0.55  0.4 - 1.5 mg/dL Final  09/23/2009 10:51 AM 0.62  0.4 - 1.5 mg/dL Final  03/30/2009  6:13 AM 0.55  0.4 - 1.5 mg/dL Final  10/05/2008  3:40 AM 0.52   Final  10/01/2008  4:00 AM 0.63   Final  09/29/2008  4:15 AM 0.58   Final  09/23/2008 11:11 AM 0.66   Final  09/17/2008  2:20 PM 0.60   Final  07/02/2008  6:30 AM 0.62   Final  03/11/2008  2:45 AM 0.43   Final  03/05/2008 10:39 AM 0.50   Final  02/18/2008  7:33 AM 0.42   Final  12/08/2007  1:40 PM 0.56   Final    Recent Labs Lab 06/08/14 0331  06/09/14 0255  06/10/14 0246 06/10/14 0856 06/10/14 2050 06/11/14 0306 06/11/14 0940 06/12/14 0311 06/13/14 1307 06/14/14 0350  NA 138  < > 140  < > 147 146 142 144 145 147 156* 156*  K 4.1  < > 3.8  < > 3.3* 3.3* 4.0 4.1 3.4* 3.6* 3.8 3.8  CL 94*  < > 90*  < > 92* 92* 89* 93* 92* 94* 103 107  CO2 27  < > 29  < > _0 GLUCOSE 126*  < > 168*  < > 185* 216* 203* 175* 165* 146* 129* 152*  BUN 14  < > 32*  < >  66* 77* 91* 100* 106* 110* 161* 177*  CREATININE 0.50  < > 0.67  < > 1.02 1.02 1.12 1.18  1.14 1.14 3.96* 4.60*  CALCIUM 8.8  < > 9.9  < > 10.6* 10.4 10.7* 10.6* 10.7* 11.0* 10.3 9.9  PHOS 5.5*  --  7.9*  --  7.1*  --   --  6.4*  --  5.5*  --   --   < > = values in this interval not displayed.  Recent Labs Lab 06/08/14 0331 06/09/14 0255 06/10/14 0246 06/11/14 0306 06/12/14 0311 06/13/14 0220 06/14/14 0350  WBC 11.7* 13.3* 18.8* 18.2* 13.7* 16.5* 15.5*  NEUTROABS 8.8* 10.3* 15.2*  --   --   --   --   HGB 10.9* 11.4* 12.8* 12.7* 13.5 12.7* 11.9*  HCT 32.5* 34.1* 39.6 38.7* 42.0 39.1 38.1*  MCV 86.9 85.7 88.8 88.0 90.7 89.7 90.3  PLT 313 436* 536* 642* 699* 791* 581*   Liver Function Tests: No results found for this basename: AST, ALT, ALKPHOS, BILITOT, PROT, ALBUMIN,  in the last 168 hours No results found for this basename: LIPASE, AMYLASE,  in the last 168 hours No results found for this basename: AMMONIA,  in the last 168 hours Cardiac Enzymes: No results found for this basename: CKTOTAL, CKMB, CKMBINDEX, TROPONINI,  in the last 168 hours Iron Studies: No results found for this basename: IRON, TIBC, TRANSFERRIN, FERRITIN,  in the last 72 hours PT/INR: _0 (inr:5)  Xrays/Other Studies: ) Results for orders placed during the hospital encounter of 06/03/14 (from the past 48 hour(s))  GLUCOSE, CAPILLARY     Status: Abnormal   Collection Time    06/12/14  5:10 PM      Result Value Ref Range   Glucose-Capillary 137 (*) 70 - 99 mg/dL  GLUCOSE, CAPILLARY     Status: Abnormal   Collection Time    06/12/14  7:36 PM      Result Value Ref Range   Glucose-Capillary 120 (*) 70 - 99 mg/dL   Comment 1 Documented in Chart     Comment 2 Notify RN    CBC     Status: Abnormal   Collection Time    06/13/14  2:20 AM      Result Value Ref Range   WBC 16.5 (*) 4.0 - 10.5 K/uL   Comment: WHITE COUNT CONFIRMED ON SMEAR   RBC 4.36  4.22 - 5.81 MIL/uL   Hemoglobin 12.7 (*) 13.0 - 17.0 g/dL   HCT 39.1  39.0 - 52.0 %   MCV 89.7  78.0 - 100.0 fL   MCH 29.1  26.0 - 34.0 pg    MCHC 32.5  30.0 - 36.0 g/dL   RDW 15.5  11.5 - 15.5 %   Platelets 791 (*) 150 - 400 K/uL  GLUCOSE, CAPILLARY     Status: Abnormal   Collection Time    06/13/14  8:05 AM      Result Value Ref Range   Glucose-Capillary 137 (*) 70 - 99 mg/dL  GLUCOSE, CAPILLARY     Status: Abnormal   Collection Time    06/13/14 11:34 AM      Result Value Ref Range   Glucose-Capillary 129 (*) 70 - 99 mg/dL  BASIC METABOLIC PANEL     Status: Abnormal   Collection Time    06/13/14  1:07 PM      Result Value Ref Range   Sodium 156 (*) 137 - 147 mEq/L   Comment: DELTA CHECK  NOTED   Potassium 3.8  3.7 - 5.3 mEq/L   Chloride 103  96 - 112 mEq/L   CO2 22  19 - 32 mEq/L   Glucose, Bld 129 (*) 70 - 99 mg/dL   BUN 161 (*) 6 - 23 mg/dL   Creatinine, Ser 3.96 (*) 0.50 - 1.35 mg/dL   Comment: DELTA CHECK NOTED   Calcium 10.3  8.4 - 10.5 mg/dL   GFR calc non Af Amer 14 (*) >90 mL/min   GFR calc Af Amer 17 (*) >90 mL/min   Comment: (NOTE)     The eGFR has been calculated using the CKD EPI equation.     This calculation has not been validated in all clinical situations.     eGFR's persistently <90 mL/min signify possible Chronic Kidney     Disease.   Anion gap 31 (*) 5 - 15  GLUCOSE, CAPILLARY     Status: Abnormal   Collection Time    06/13/14  4:46 PM      Result Value Ref Range   Glucose-Capillary 109 (*) 70 - 99 mg/dL  GLUCOSE, CAPILLARY     Status: Abnormal   Collection Time    06/13/14  9:57 PM      Result Value Ref Range   Glucose-Capillary 122 (*) 70 - 99 mg/dL  GLUCOSE, CAPILLARY     Status: Abnormal   Collection Time    06/13/14 10:05 PM      Result Value Ref Range   Glucose-Capillary 125 (*) 70 - 99 mg/dL  CBC     Status: Abnormal   Collection Time    06/14/14  3:50 AM      Result Value Ref Range   WBC 15.5 (*) 4.0 - 10.5 K/uL   RBC 4.22  4.22 - 5.81 MIL/uL   Hemoglobin 11.9 (*) 13.0 - 17.0 g/dL   HCT 38.1 (*) 39.0 - 52.0 %   MCV 90.3  78.0 - 100.0 fL   MCH 28.2  26.0 - 34.0 pg    MCHC 31.2  30.0 - 36.0 g/dL   RDW 15.6 (*) 11.5 - 15.5 %   Platelets 581 (*) 150 - 400 K/uL   Comment: REPEATED TO VERIFY     DELTA CHECK NOTED  BASIC METABOLIC PANEL     Status: Abnormal   Collection Time    06/14/14  3:50 AM      Result Value Ref Range   Sodium 156 (*) 137 - 147 mEq/L   Potassium 3.8  3.7 - 5.3 mEq/L   Chloride 107  96 - 112 mEq/L   CO2 22  19 - 32 mEq/L   Glucose, Bld 152 (*) 70 - 99 mg/dL   BUN 177 (*) 6 - 23 mg/dL   Creatinine, Ser 4.60 (*) 0.50 - 1.35 mg/dL   Calcium 9.9  8.4 - 10.5 mg/dL   GFR calc non Af Amer 12 (*) >90 mL/min   GFR calc Af Amer 14 (*) >90 mL/min   Comment: (NOTE)     The eGFR has been calculated using the CKD EPI equation.     This calculation has not been validated in all clinical situations.     eGFR's persistently <90 mL/min signify possible Chronic Kidney     Disease.   Anion gap 27 (*) 5 - 15   Comment: RESULT CHECKED  MAGNESIUM     Status: Abnormal   Collection Time    06/14/14  3:50 AM      Result  Value Ref Range   Magnesium 3.6 (*) 1.5 - 2.5 mg/dL  GLUCOSE, CAPILLARY     Status: Abnormal   Collection Time    06/14/14  5:44 AM      Result Value Ref Range   Glucose-Capillary 139 (*) 70 - 99 mg/dL  GLUCOSE, CAPILLARY     Status: Abnormal   Collection Time    06/14/14 11:16 AM      Result Value Ref Range   Glucose-Capillary 162 (*) 70 - 99 mg/dL   Comment 1 Notify RN     Dg Swallowing Func-speech Pathology  06/14/2014   Katherene Ponto [place]bl[place], CCC-SLP     06/14/2014 10:32 AM Objective Swallowing Evaluation: Modified Barium Swallowing Study   Patient Details  Name: Roy [name] MRN: [id number] Date of Birth: 21-Jan-1946  Today's Date: 06/14/2014 Time: 0950-1015 SLP Time Calculation (min): 25 min  Past Medical History:  Past Medical History  Diagnosis Date  . Cerebrovascular disease 2009    TIA; 2009- right ICA stent; re-intervention for restenosis  complicated by [facility] w/o sx  . Degenerative joint disease     Total shoulder  arthroplasty-right  . LBBB (left bundle branch block)     Normal echo-2011; stress nuclear in 09/2010--septal  hypoperfusion representing nontransmural infarction or the effect  of left bundle branch block, no ischemia  . Hyperlipidemia   . Hypertension   . Chronic obstructive pulmonary disease   . Tobacco abuse     -100 pack years; 1.5 packs per day  . Anxiety and depression   . Alcohol abuse     6 beers per day; hospital admission in 2009 for withdrawal  symptoms  . Thrombocytopenia   . Tubular adenoma of colon    Past Surgical History:  Past Surgical History  Procedure Laterality Date  . Lumbar fusion  2010  . Vasectomy  1971  . Colonoscopy w/ polypectomy  2011  . Total shoulder arthroplasty  2011    Right-Dr. Tamera Punt  . Colonoscopy  08/22/09    Fields-(Tubular Adenoma)3-mm transverse polyp/4-mm polyp  otherwise noraml/small internal hemorrhoids  . Esophagogastroduodenoscopy [name] 03/05/2014    Procedure: ESOPHAGOGASTRODUODENOSCOPY (EGD);  Surgeon: Danie Binder, MD;  [facility];  Service: Endoscopy;   Laterality: [name];  11:45  . Agile capsule [name] 03/18/2014    Procedure: AGILE CAPSULE;  Surgeon: Danie Binder, MD;   [facility];  Service: Endoscopy;  Laterality: [name];   7:30  . Givens capsule study [name] 03/30/2014    Procedure: GIVENS CAPSULE STUDY;  Surgeon: Danie Binder, MD;   [facility]: [facility];  Service: Endoscopy;  Laterality: [name];   7:30  . Colonoscopy [name] 04/14/2014    Procedure: COLONOSCOPY;  Surgeon: Danie Binder, MD;   [facility]: [facility];  Service: Endoscopy;  Laterality: [name];   9:79   HPI:  68 year old male, current smoker with PMH of COPD, alcohol abuse,  chronic bronchitis, TIA, HTN, LBBB who presented to [facility] ED via  EMS 7/9 for progressively worsening SOB and respiratory distress.  Transferred to [facility].  Intubated 7/10-7/17. NSTEMI     Assessment / Plan / Recommendation Clinical Impression  Dysphagia Diagnosis: Moderate oral phase dysphagia;Mild  pharyngeal phase  dysphagia Clinical impression: Pt presents with a moderate oral dysphagia  impacting safety with PO. Pt is moderately confused, edentulous  and tremulous with very dry oral mucosa. With most boluses, pt  will briefly hold bolus orally with occasional lingual tremors  prior to active  transit. Intermittently pt has premature spillage  of partial bolus to pyriforms and sometimes pt actively transits  bolus with a delayed swallow initiation and aspiration before the  swallow. Of note, pt often clears throat post swallow, unrelated  to penetration or aspiration. Initially aspiration event was  silent, but then followed by late, hard, effective cough that  expelled thin aspirate. In some trials swallow was timely,  suggestive of a primary cognitive problem rather than a true  sensory deficit. Strength is WNL and trace residuals coating all  oropahryngeal structures likely due to dry mucosa. Recommend  nectar thick liquids as pt orally manages this bolus best and has  decreased risk of aspiration before the swallow. Will also start  pt with a puree diet, though expect as mentation improves he may  tolerate soft solids and thin liquids well.     Treatment Recommendation  Therapy as outlined in treatment plan below    Diet Recommendation Dysphagia 1 (Puree);Nectar-thick liquid   Liquid Administration via: Cup;Straw Medication Administration: Crushed with puree Supervision: Staff to assist with self feeding Compensations: Slow rate;Small sips/bites Postural Changes and/or Swallow Maneuvers: Seated upright 90  degrees;Upright 30-60 min after meal    Other  Recommendations Oral Care Recommendations: Oral care BID Other Recommendations: Order thickener from pharmacy   Follow Up Recommendations  Skilled Nursing facility    Frequency and Duration min 2x/week  2 weeks   Pertinent Vitals/Pain NA    SLP Swallow Goals     General Date of Onset: 06/03/14 HPI: 68 year old male, current smoker with PMH of COPD, alcohol  abuse, chronic  bronchitis, TIA, HTN, LBBB who presented to [facility] ED  via EMS 7/9 for progressively worsening SOB and respiratory  distress. Transferred to [facility].  Intubated 7/10-7/17. NSTEMI Type of Study: Modified Barium Swallowing Study Reason for Referral: Objectively evaluate swallowing function Previous Swallow Assessment: none per records Diet Prior to this Study: NPO Temperature Spikes Noted: No Respiratory Status: Room air History of Recent Intubation: Yes Length of Intubations (days): 7 days Date extubated: 06/11/14 Behavior/Cognition: Alert;Cooperative;Decreased sustained  attention;Requires cueing;Distractible Oral Cavity - Dentition: Edentulous Oral Motor / Sensory Function: Within functional limits Self-Feeding Abilities: Needs assist Patient Positioning: Upright in chair Baseline Vocal Quality: Breathy;Hoarse;Low vocal intensity Volitional Cough: Strong Volitional Swallow: Able to elicit Anatomy:  (dry oral mucosa) Pharyngeal Secretions: Not observed secondary MBS    Reason for Referral Objectively evaluate swallowing function   Oral Phase Oral Preparation/Oral Phase Oral Phase: Impaired Oral - Nectar Oral - Nectar Cup: Lingual/palatal residue;Delayed oral  transit;Reduced posterior propulsion;Holding of bolus;Lingual  pumping Oral - Nectar Straw: Lingual/palatal residue;Delayed oral  transit;Reduced posterior propulsion;Holding of bolus;Lingual  pumping Oral - Thin Oral - Thin Cup: Lingual/palatal residue;Delayed oral  transit;Reduced posterior propulsion;Holding of bolus;Lingual  pumping Oral - Thin Straw: Lingual/palatal residue;Delayed oral  transit;Reduced posterior propulsion;Holding of bolus;Lingual  pumping Oral - Solids Oral - Puree: Lingual/palatal residue;Delayed oral  transit;Reduced posterior propulsion;Holding of bolus;Lingual  pumping Oral - Mechanical Soft: Lingual/palatal residue;Delayed oral  transit;Reduced posterior propulsion;Holding of bolus;Lingual  pumping;Piecemeal swallowing Oral - Pill:  Lingual/palatal residue;Delayed oral transit;Reduced  posterior propulsion;Holding of bolus;Lingual pumping   Pharyngeal Phase Pharyngeal Phase Pharyngeal Phase: Impaired Pharyngeal - Nectar Pharyngeal - Nectar Cup: Delayed swallow initiation;Premature  spillage to valleculae Pharyngeal - Nectar Straw: Delayed swallow initiation;Premature  spillage to valleculae Pharyngeal - Thin Pharyngeal - Thin Cup: Delayed swallow initiation;Premature  spillage to pyriform sinuses Pharyngeal - Thin Straw: Delayed swallow initiation;Premature  spillage to pyriform sinuses;Penetration/Aspiration before  swallow;Moderate aspiration Penetration/Aspiration details (thin straw): Material enters  airway, passes BELOW cords without attempt by patient to eject  out (silent aspiration) Pharyngeal - Solids Pharyngeal - Puree: Delayed swallow initiation Pharyngeal - Mechanical Soft: Delayed swallow  initiation;Premature spillage to pyriform sinuses  Cervical Esophageal Phase    GO    Cervical Esophageal Phase Cervical Esophageal Phase: [facility]        Herbie Baltimore, MA CCC-SLP [phone]  DeBlois, Katherene Ponto 06/14/2014, 10:27 AM     PMH:   Past Medical History  Diagnosis Date  . Cerebrovascular disease 2009    TIA; 2009- right ICA stent; re-intervention for restenosis complicated by [facility] w/o sx  . Degenerative joint disease     Total shoulder arthroplasty-right  . LBBB (left bundle branch block)     Normal echo-2011; stress nuclear in 09/2010--septal hypoperfusion representing nontransmural infarction or the effect of left bundle branch block, no ischemia  . Hyperlipidemia   . Hypertension   . Chronic obstructive pulmonary disease   . Tobacco abuse     -100 pack years; 1.5 packs per day  . Anxiety and depression   . Alcohol abuse     6 beers per day; hospital admission in 2009 for withdrawal symptoms  . Thrombocytopenia   . Tubular adenoma of colon     PSH:   Past Surgical History  Procedure Laterality Date  . Lumbar  fusion  2010  . Vasectomy  1971  . Colonoscopy w/ polypectomy  2011  . Total shoulder arthroplasty  2011    Right-Dr. Tamera Punt  . Colonoscopy  08/22/09    Fields-(Tubular Adenoma)3-mm transverse polyp/4-mm polyp otherwise noraml/small internal hemorrhoids  . Esophagogastroduodenoscopy [name] 03/05/2014    Procedure: ESOPHAGOGASTRODUODENOSCOPY (EGD);  Surgeon: Danie Binder, MD;  [facility];  Service: Endoscopy;  Laterality: [name];  11:45  . Agile capsule [name] 03/18/2014    Procedure: AGILE CAPSULE;  Surgeon: Danie Binder, MD;  [facility];  Service: Endoscopy;  Laterality: [name];  7:30  . Givens capsule study [name] 03/30/2014    Procedure: GIVENS CAPSULE STUDY;  Surgeon: Danie Binder, MD;  [facility];  Service: Endoscopy;  Laterality: [name];  7:30  . Colonoscopy [name] 04/14/2014    Procedure: COLONOSCOPY;  Surgeon: Danie Binder, MD;  [facility];  Service: Endoscopy;  Laterality: [name];  9:30    Allergies: No Known Allergies  Medications:   Prior to Admission medications   Medication Sig Start Date End Date Taking? Authorizing Provider  amLODipine-benazepril (LOTREL) 5-20 MG per capsule Take 1 capsule by mouth 2 (two) times daily.    Yes Historical Provider, MD  aspirin EC 81 MG tablet Take 81 mg by mouth every evening.    Yes Historical Provider, MD  atorvastatin (LIPITOR) 20 MG tablet Take 20 mg by mouth daily.    Yes Historical Provider, MD  cloNIDine (CATAPRES - DOSED IN MG/24 HR) 0.1 mg/24hr patch Place 0.1 mg onto the skin once a week. Monday   Yes Historical Provider, MD  clopidogrel (PLAVIX) 75 MG tablet Take 75 mg by mouth every evening.    Yes Historical Provider, MD  Coenzyme Q10 (CO Q 10) 100 MG CAPS Take 100 mg by mouth every evening.    Yes Historical Provider, MD  folic acid (FOLVITE) 154 MCG tablet Take 800 mcg by mouth daily.   Yes Historical Provider, MD  [name] Oil 300 MG CAPS Take 300 mg by mouth every evening.  Yes Historical  Provider, MD  loratadine (CLARITIN) 10 MG tablet Take 10 mg by mouth.    Yes Historical Provider, MD  LORazepam (ATIVAN) 1 MG tablet Take 1 mg by mouth 3 (three) times daily.    Yes Historical Provider, MD  Multiple Vitamins-Minerals (CENTRUM SILVER PO) Take 0.5 tablets by mouth 2 (two) times daily.    Yes Historical Provider, MD  pantoprazole (PROTONIX) 40 MG tablet Take 40 mg by mouth 2 (two) times daily.   Yes Historical Provider, MD  PARoxetine (PAXIL) 20 MG tablet Take 20 mg by mouth daily.   Yes Historical Provider, MD  Prenatal MV-Min-Fe Fum-FA-DHA (PRENATAL 1 PO) Take 1 tablet by mouth daily.   Yes Historical Provider, MD  pseudoephedrine-guaifenesin (MUCINEX D) 60-600 MG per tablet Take 1 tablet by mouth every 12 (twelve) hours as needed for congestion.   Yes Historical Provider, MD  pyridOXINE (VITAMIN B-6) 100 MG tablet Take 100 mg by mouth daily.   Yes Historical Provider, MD  Thiamine HCl (VITAMIN B-1) 250 MG tablet Take 125 mg by mouth daily.    Yes Historical Provider, MD  vitamin C (ASCORBIC ACID) 500 MG tablet Take 500 mg by mouth daily.     Yes Historical Provider, MD    Discontinued Meds:   Medications Discontinued During This Encounter  Medication Reason  . ondansetron (ZOFRAN) injection 4 mg   . heparin injection 5,000 Units   . albuterol (PROVENTIL) (2.5 MG/3ML) 0.083% nebulizer solution 2.5 mg   . ipratropium (ATROVENT) nebulizer solution 0.5 mg   . methylPREDNISolone sodium succinate (SOLU-MEDROL) 40 mg/mL injection 40 mg   . LORazepam (ATIVAN) injection 0.5-1 mg   . midazolam (VERSED) injection 1-2 mg   . furosemide (LASIX) injection 40 mg   . antiseptic oral rinse (BIOTENE) solution 15 mL   . pantoprazole (PROTONIX) injection 40 mg   . lidocaine (cardiac) 100 mg/23m (XYLOCAINE) 20 MG/ML injection 2% Returned to ADS  . succinylcholine (ANECTINE) 20 MG/ML injection Returned to ADS  . rocuronium (ZEMURON) 50 MG/5ML injection Returned to ADS  . etomidate (AMIDATE) 2  MG/ML injection Returned to ADS  . dexmedetomidine (PRECEDEX) 200 MCG/50ML infusion Change in therapy  . chlorhexidine (PERIDEX) 0.12 % solution 15 mL   . antiseptic oral rinse (BIOTENE) solution 15 mL   . potassium chloride (K-DUR,KLOR-CON) CR tablet 30 mEq   . heparin ADULT infusion 100 units/mL (25000 units/250 mL)   . potassium chloride 10 mEq in 50 mL *CENTRAL LINE* IVPB   . azithromycin (ZITHROMAX) 500 mg in dextrose 5 % 250 mL IVPB   . feeding supplement (VITAL HIGH PROTEIN) liquid 1,000 mL   . magnesium sulfate 40 MG/ML IVPB Returned to ADS  . thiamine (B-1) injection 1449mg   . folic acid injection 1 mg   . pantoprazole (PROTONIX) injection 40 mg   . furosemide (LASIX) 250 mg in dextrose 5 % 250 mL infusion   . metoprolol tartrate (LOPRESSOR) tablet 12.5 mg Change in therapy  . furosemide (LASIX) 250 mg in dextrose 5 % 250 mL infusion Change in therapy  . feeding supplement (VITAL HIGH PROTEIN) liquid 1,000 mL   . cefTRIAXone (ROCEPHIN) 1 g in dextrose 5 % 50 mL IVPB   . free water 250 mL   . insulin aspart (novoLOG) injection 0-15 Units   . pantoprazole sodium (PROTONIX) 40 mg/20 mL oral suspension 40 mg   . LORazepam (ATIVAN) injection 1-4 mg   . LORazepam (ATIVAN) injection 1-2 mg   .  propofol (DIPRIVAN) 10 mg/ml infusion   . heparin ADULT infusion 100 units/mL (25000 units/250 mL)   . feeding supplement (VITAL HIGH PROTEIN) liquid 1,000 mL   . fentaNYL (SUBLIMAZE) injection 25 mcg   . ipratropium-albuterol (DUONEB) 0.5-2.5 (3) MG/3ML nebulizer solution 3 mL   . losartan (COZAAR) tablet 25 mg     Social History:  reports that he has been smoking Cigarettes.  He has a 60 pack-year smoking history. He has never used smokeless tobacco. He reports that he drinks about 36 ounces of alcohol per week. He reports that he does not use illicit drugs.  Family History:   Family History  Problem Relation Age of Onset  . Prostate cancer Brother   . Coronary artery disease Mother    . Diabetes Mother   . Heart disease Mother   . Hypertension Mother   . Heart attack Mother   . Alzheimer's disease Sister   . Alzheimer's disease Father   . Diabetes Father   . Colon cancer Neg Hx     Blood pressure 99/65, pulse 75, temperature 97.9 F (36.6 C), temperature source Axillary, resp. rate 18, height _0  (1.676 m), weight 58 kg (127 lb 13.9 oz), SpO2 96.00%. General appearance: appears stated age, cachectic, distracted and slowed mentation Head: Normocephalic, without obvious abnormality, atraumatic Eyes: negative Throat: normal findings: palate normal and tongue midline and normal Neck: no adenopathy, no carotid bruit, no JVD, supple, symmetrical, trachea midline and thyroid not enlarged, symmetric, no tenderness/mass/nodules Back: negative, symmetric, no curvature. ROM normal. No CVA tenderness. Resp: diminished breath sounds anterior - bilateral and bibasilar Chest wall: no tenderness Cardio: S1, S2 normal GI: soft, non-tender; bowel sounds normal; no masses,  no organomegaly Male genitalia: normal Extremities: extremities normal, atraumatic, no cyanosis or edema Neurologic: Mental status: Alert, oriented, thought content appropriate, affect: constricted       Mirtha Jain, Hunt Oris, MD 06/14/2014, 12:33 PM

## 2014-06-14 NOTE — Progress Notes (Signed)
PULMONARY / CRITICAL CARE MEDICINE  Name: Roy [name] MRN: [id number] DOB: [birth year]    ADMISSION DATE:  06/03/2014 CONSULTATION DATE:  06/03/2014  REFERRING MD :  Lacinda Axon ([facility]) PRIMARY SERVICE: PCCM  CHIEF COMPLAINT:  Respiratory failure  BRIEF PATIENT DESCRIPTION:  68 y.o. active smoker (120 pack years) with COPD admitted 7/9 with pneumonia and hypoxemic respiratory failure. Initially with elevated troponins (peaked at 4.84) but EKG neg acute MI, CXR with pulm edema and ultimately Found to have severe systolic dysfunction with an EF of 20% with global hypokinesis. He was intubated and treated for AECOPD +/- PNA with IV steroids, empiric IV abx for CAP, Nebulized BDs, diuresis and cardiology was consulted. He was successfully extubated 7/17 and he was continued on BD's and aggressive pulm hygiene. He improved with abx and diuresis and and cardiology followed closely for NSTEMI for medical management with asa, plavix, B blocker, statin. Initial cardiology plan was for cardiac cath but course was c/b ETOH withdrawal and metabolic encephalopathy and cath was deferred in setting confusion. At this time he is overall much improved, remains moderately confused but improving. Passed swallow eval with only mild aspiration and is ok for PO diet per speech therapy. He needs continued rehab efforts and plan is for d/c to [facility] when medically ready.    SIGNIFICANT EVENTS / STUDIES:  7/9    Transferred to [facility] 7/10  TTE >>> LVEF 20%, physiology suggestive of restriction, LAE 7/12  Intubated 7/17  Extubated 7/21 MBS - mild aspiration, ok for nectar thick liquids, pureed diet  LINES / TUBES: ETT 7/10 >>> 7/17 R IJ TLC 7/10 >>> out  Foley 7/10 >>>  CULTURES: 7/9 Blood >>> neg 7/9 Urine >>> neg 7/9 MRSA by PCR >>> neg  ANTIBIOTICS: Azithromycin 7/9 >>> 7/13 Ceftriaxone 7/9 >>>7/19  INTERVAL HISTORY:  Delirium.  Worsening SCr.    VITAL SIGNS: Temp:  [97.1 F (36.2 C)-98 F (36.7 C)] 97.9 F (36.6  C) (07/20 1047) Pulse Rate:  [75-120] 75 (07/20 1047) Resp:  [18-28] 18 (07/20 1047) BP: (99-122)/(44-68) 99/65 mmHg (07/20 1047) SpO2:  [95 %-98 %] 96 % (07/20 1047) FiO2 (%):  [21 %] 21 % (07/20 0756) Weight:  [127 lb 13.9 oz (58 kg)-132 lb 4.4 oz (60 kg)] 127 lb 13.9 oz (58 kg) (07/20 0639)  HEMODYNAMICS:   VENTILATOR SETTINGS: Vent Mode:  [-]  FiO2 (%):  [21 %] 21 % INTAKE / OUTPUT: Intake/Output     07/19 0701 - 07/20 0700 07/20 0701 - 07/21 0700   I.V. (mL/kg) 250 (4.3)    Total Intake(mL/kg) 250 (4.3)    Urine (mL/kg/hr)     Stool 1 (0)    Total Output 1     Net +249 0        Urine Occurrence  2 x    PHYSICAL EXAMINATION: Gen: No distress HEENT: Moist membranes PULM: Bilateral air entry, few rales CV: regular, no murmurs AB: Soft, bowel sounds present Ext: Moves all extremities Derm: Skin breakdown perineal area  Neuro:  Awake, does not follow commands  LABS: CBC  Recent Labs Lab 06/12/14 0311 06/13/14 0220 06/14/14 0350  WBC 13.7* 16.5* 15.5*  HGB 13.5 12.7* 11.9*  HCT 42.0 39.1 38.1*  PLT 699* 791* 581*   Coag's No results found for this basename: APTT, INR,  in the last 168 hours  BMET  Recent Labs Lab 06/12/14 0311 06/13/14 1307 06/14/14 0350  NA 147 156* 156*  K 3.6* 3.8 3.8  CL  94* 103 107  CO2 31 22 22   BUN 110* 161* 177*  CREATININE 1.14 3.96* 4.60*  GLUCOSE 146* 129* 152*   Electrolytes  Recent Labs Lab 06/10/14 0246  06/11/14 0306  06/12/14 0311 06/13/14 1307 06/14/14 0350  CALCIUM 10.6*  < > 10.6*  < > 11.0* 10.3 9.9  MG 2.6*  --  3.0*  --  3.3*  --  3.6*  PHOS 7.1*  --  6.4*  --  5.5*  --   --   < > = values in this interval not displayed.  Sepsis Markers No results found for this basename: LATICACIDVEN, PROCALCITON, O2SATVEN,  in the last 168 hours ABG  Recent Labs Lab 06/09/14 0500 06/10/14 0415 06/11/14 0305  PHART 7.502* 7.481* 7.432  PCO2ART 39.0 41.3 45.8*  PO2ART 87.0 96.6 103.0*   Liver  Enzymes No results found for this basename: AST, ALT, ALKPHOS, BILITOT, ALBUMIN,  in the last 168 hours  Cardiac Enzymes No results found for this basename: TROPONINI, PROBNP,  in the last 168 hours Glucose  Recent Labs Lab 06/13/14 1134 06/13/14 1646 06/13/14 2157 06/13/14 2205 06/14/14 0544 06/14/14 1116  GLUCAP 129* 109* 122* 125* 139* 162*   IMAGING:  Dg Swallowing Func-speech Pathology  06/14/2014   Katherene Ponto [place]bl[place], CCC-SLP     06/14/2014 10:32 AM Objective Swallowing Evaluation: Modified Barium Swallowing Study   Patient Details  Name: Roy [name] MRN: [id number] Date of Birth: 02-17-1946  Today's Date: 06/14/2014 Time: 0950-1015 SLP Time Calculation (min): 25 min  Past Medical History:  Past Medical History  Diagnosis Date  . Cerebrovascular disease 2009    TIA; 2009- right ICA stent; re-intervention for restenosis  complicated by [facility] w/o sx  . Degenerative joint disease     Total shoulder arthroplasty-right  . LBBB (left bundle branch block)     Normal echo-2011; stress nuclear in 09/2010--septal  hypoperfusion representing nontransmural infarction or the effect  of left bundle branch block, no ischemia  . Hyperlipidemia   . Hypertension   . Chronic obstructive pulmonary disease   . Tobacco abuse     -100 pack years; 1.5 packs per day  . Anxiety and depression   . Alcohol abuse     6 beers per day; hospital admission in 2009 for withdrawal  symptoms  . Thrombocytopenia   . Tubular adenoma of colon    Past Surgical History:  Past Surgical History  Procedure Laterality Date  . Lumbar fusion  2010  . Vasectomy  1971  . Colonoscopy w/ polypectomy  2011  . Total shoulder arthroplasty  2011    Right-Dr. Tamera Punt  . Colonoscopy  08/22/09    Fields-(Tubular Adenoma)3-mm transverse polyp/4-mm polyp  otherwise noraml/small internal hemorrhoids  . Esophagogastroduodenoscopy [name] 03/05/2014    Procedure: ESOPHAGOGASTRODUODENOSCOPY (EGD);  Surgeon: Danie Binder, MD;  Location: [facility];   Service: Endoscopy;   Laterality: [name];  11:45  . Agile capsule [name] 03/18/2014    Procedure: AGILE CAPSULE;  Surgeon: Danie Binder, MD;   Location: [facility];  Service: Endoscopy;  Laterality: [name];   7:30  . Givens capsule study [name] 03/30/2014    Procedure: GIVENS CAPSULE STUDY;  Surgeon: Danie Binder, MD;   Location: [facility];  Service: Endoscopy;  Laterality: [name];   7:30  . Colonoscopy [name] 04/14/2014    Procedure: COLONOSCOPY;  Surgeon: Danie Binder, MD;   Location: [facility];  Service: Endoscopy;  Laterality: [name];   9:30  HPI:  68 year old male, current smoker with PMH of COPD, alcohol abuse,  chronic bronchitis, TIA, HTN, LBBB who presented to [facility] ED via  EMS 7/9 for progressively worsening SOB and respiratory distress.  Transferred to [facility].  Intubated 7/10-7/17. NSTEMI     Assessment / Plan / Recommendation Clinical Impression  Dysphagia Diagnosis: Moderate oral phase dysphagia;Mild  pharyngeal phase dysphagia Clinical impression: Pt presents with a moderate oral dysphagia  impacting safety with PO. Pt is moderately confused, edentulous  and tremulous with very dry oral mucosa. With most boluses, pt  will briefly hold bolus orally with occasional lingual tremors  prior to active transit. Intermittently pt has premature spillage  of partial bolus to pyriforms and sometimes pt actively transits  bolus with a delayed swallow initiation and aspiration before the  swallow. Of note, pt often clears throat post swallow, unrelated  to penetration or aspiration. Initially aspiration event was  silent, but then followed by late, hard, effective cough that  expelled thin aspirate. In some trials swallow was timely,  suggestive of a primary cognitive problem rather than a true  sensory deficit. Strength is WNL and trace residuals coating all  oropahryngeal structures likely due to dry mucosa. Recommend  nectar thick liquids as pt orally manages this bolus best and has  decreased risk of aspiration before the  swallow. Will also start  pt with a puree diet, though expect as mentation improves he may  tolerate soft solids and thin liquids well.     Treatment Recommendation  Therapy as outlined in treatment plan below    Diet Recommendation Dysphagia 1 (Puree);Nectar-thick liquid   Liquid Administration via: Cup;Straw Medication Administration: Crushed with puree Supervision: Staff to assist with self feeding Compensations: Slow rate;Small sips/bites Postural Changes and/or Swallow Maneuvers: Seated upright 90  degrees;Upright 30-60 min after meal    Other  Recommendations Oral Care Recommendations: Oral care BID Other Recommendations: Order thickener from pharmacy   Follow Up Recommendations  Skilled Nursing facility    Frequency and Duration min 2x/week  2 weeks   Pertinent Vitals/Pain NA    SLP Swallow Goals     General Date of Onset: 06/03/14 HPI: 68 year old male, current smoker with PMH of COPD, alcohol  abuse, chronic bronchitis, TIA, HTN, LBBB who presented to [facility] ED  via EMS 7/9 for progressively worsening SOB and respiratory  distress. Transferred to [facility].  Intubated 7/10-7/17. NSTEMI Type of Study: Modified Barium Swallowing Study Reason for Referral: Objectively evaluate swallowing function Previous Swallow Assessment: none per records Diet Prior to this Study: NPO Temperature Spikes Noted: No Respiratory Status: Room air History of Recent Intubation: Yes Length of Intubations (days): 7 days Date extubated: 06/11/14 Behavior/Cognition: Alert;Cooperative;Decreased sustained  attention;Requires cueing;Distractible Oral Cavity - Dentition: Edentulous Oral Motor / Sensory Function: Within functional limits Self-Feeding Abilities: Needs assist Patient Positioning: Upright in chair Baseline Vocal Quality: Breathy;Hoarse;Low vocal intensity Volitional Cough: Strong Volitional Swallow: Able to elicit Anatomy:  (dry oral mucosa) Pharyngeal Secretions: Not observed secondary MBS    Reason for Referral Objectively evaluate  swallowing function   Oral Phase Oral Preparation/Oral Phase Oral Phase: Impaired Oral - Nectar Oral - Nectar Cup: Lingual/palatal residue;Delayed oral  transit;Reduced posterior propulsion;Holding of bolus;Lingual  pumping Oral - Nectar Straw: Lingual/palatal residue;Delayed oral  transit;Reduced posterior propulsion;Holding of bolus;Lingual  pumping Oral - Thin Oral - Thin Cup: Lingual/palatal residue;Delayed oral  transit;Reduced posterior propulsion;Holding of bolus;Lingual  pumping Oral - Thin Straw: Lingual/palatal residue;Delayed oral  transit;Reduced posterior propulsion;Holding of bolus;Lingual  pumping Oral - Solids Oral - Puree: Lingual/palatal residue;Delayed oral  transit;Reduced posterior propulsion;Holding of bolus;Lingual  pumping Oral - Mechanical Soft: Lingual/palatal residue;Delayed oral  transit;Reduced posterior propulsion;Holding of bolus;Lingual  pumping;Piecemeal swallowing Oral - Pill: Lingual/palatal residue;Delayed oral transit;Reduced  posterior propulsion;Holding of bolus;Lingual pumping   Pharyngeal Phase Pharyngeal Phase Pharyngeal Phase: Impaired Pharyngeal - Nectar Pharyngeal - Nectar Cup: Delayed swallow initiation;Premature  spillage to valleculae Pharyngeal - Nectar Straw: Delayed swallow initiation;Premature  spillage to valleculae Pharyngeal - Thin Pharyngeal - Thin Cup: Delayed swallow initiation;Premature  spillage to pyriform sinuses Pharyngeal - Thin Straw: Delayed swallow initiation;Premature  spillage to pyriform sinuses;Penetration/Aspiration before  swallow;Moderate aspiration Penetration/Aspiration details (thin straw): Material enters  airway, passes BELOW cords without attempt by patient to eject  out (silent aspiration) Pharyngeal - Solids Pharyngeal - Puree: Delayed swallow initiation Pharyngeal - Mechanical Soft: Delayed swallow  initiation;Premature spillage to pyriform sinuses  Cervical Esophageal Phase    GO    Cervical Esophageal Phase Cervical Esophageal  Phase: [facility]        Herbie Baltimore, MA CCC-SLP [phone]  DeBlois, Katherene Ponto 06/14/2014, 10:27 AM    ASSESSMENT / PLAN:  PULMONARY A: Acute hypoxemic respiratory failure, extubated 7/17 Acute pulmonary edema Possible pneumonia ( CAP ) COPD with exacerbation P: Goal SpO2>92 Supplemental oxygen Incentive spirometry / flutter valve Albuterol / Atrovent Mobilize   CARDIOVASCULAR A: NSTEMI 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 P:  Trend BMP Cont gentle volume  Will ask renal to eval   GASTROINTESTINAL A: Dysphagia GI Px is not indicate P: Diet per ST recs   HEMATOLOGIC A: VTE px Heparinization for ACS P:  Trend CBC Heparin [place]  INFECTIOUS A: CAP P:   S/p 10 day course rocephin   ENDOCRINE A: No acute issues  P:   SSI  NEUROLOGIC A: Acute encephalopathy / delirium - uremic? Possible alcohol withdrawal Anxiety, depression P:  Risperdal 0.5 mg in qam and 1 mg qhs Thiamine / Folate PT / OT Sitter for safety / agitation.   Will defer tx to [facility] for today with worsening renal function.  Renal to see.  Have asked [facility] to assume care in am 7/21.   Nickolas Madrid, NP 06/14/2014  11:40 AM Pager: [phone] or [phone]  *Care during the described time interval was provided by me and/or other providers on the critical care team. I have reviewed this patient's available data, including medical history, events of note, physical examination and test results as part of my evaluation.  Patient seen and examined, agree with above note.  I dictated the care and orders written for this patient under my direction.  Rush Farmer, MD [phone]

## 2014-06-14 NOTE — Progress Notes (Signed)
Dr. Augustin Coupe on unit and made aware of bladder scan results. No new orders. Will continue to monitor.

## 2014-06-14 NOTE — Progress Notes (Addendum)
TELEMETRY: Reviewed telemetry pt in NSR with occ PVC: Filed Vitals:   06/13/14 1944 06/14/14 0639 06/14/14 0756 06/14/14 1047  BP: 107/60 122/61  99/65  Pulse: 120 77  75  Temp: 98 F (36.7 C) 97.1 F (36.2 C)  97.9 F (36.6 C)  TempSrc: Oral Oral  Axillary  Resp: 18 18  18   Height:      Weight:  127 lb 13.9 oz (58 kg)    SpO2: 98% 97% 97% 96%    Intake/Output Summary (Last 24 hours) at 06/14/14 1120 Last data filed at 06/14/14 0930  Gross per 24 hour  Intake    250 ml  Output      1 ml  Net    249 ml   Filed Weights   06/13/14 0500 06/13/14 1807 06/14/14 0639  Weight: 130 lb 8.2 oz (59.2 kg) 132 lb 4.4 oz (60 kg) 127 lb 13.9 oz (58 kg)    Subjective Patient awake but not able to converse intelligibly.   [name] antiseptic oral rinse  15 mL Mouth Rinse QID  . aspirin  81 mg Oral Daily  . [START ON 06/15/2014] aspirin  81 mg Oral Pre-Cath  . atorvastatin  80 mg Oral q1800  . chlorhexidine  15 mL Mouth Rinse BID  . clopidogrel  75 mg Oral Daily  . folic acid  1 mg Oral Daily  . heparin subcutaneous  5,000 Units Subcutaneous 3 times per day  . insulin aspart  0-15 Units Subcutaneous TID WC  . insulin aspart  0-5 Units Subcutaneous QHS  . ipratropium-albuterol  3 mL Nebulization TID  . metoprolol tartrate  25 mg Oral TID  . risperiDONE  0.5 mg Oral q morning - 10a  . risperiDONE  1 mg Oral QHS  . sodium chloride  3 mL Intravenous Q12H  . thiamine  100 mg Oral Daily   . sodium chloride 10 mL/hr at 06/14/14 0518  . sodium chloride 100 mL/hr at 06/13/14 1800    LABS: Basic Metabolic Panel:  Recent Labs  06/12/14 0311 06/13/14 1307 06/14/14 0350  NA 147 156* 156*  K 3.6* 3.8 3.8  CL 94* 103 107  CO2 31 22 22   GLUCOSE 146* 129* 152*  BUN 110* 161* 177*  CREATININE 1.14 3.96* 4.60*  CALCIUM 11.0* 10.3 9.9  MG 3.3*  --  3.6*  PHOS 5.5*  --   --    Liver Function Tests: No results found for this basename: AST, ALT, ALKPHOS, BILITOT, PROT, ALBUMIN,  in the last  72 hours No results found for this basename: LIPASE, AMYLASE,  in the last 72 hours CBC:  Recent Labs  06/13/14 0220 06/14/14 0350  WBC 16.5* 15.5*  HGB 12.7* 11.9*  HCT 39.1 38.1*  MCV 89.7 90.3  PLT 791* 581*   Cardiac Enzymes: No results found for this basename: CKTOTAL, CKMB, CKMBINDEX, TROPONINI,  in the last 72 hours BNP: No results found for this basename: PROBNP,  in the last 72 hours D-Dimer: No results found for this basename: DDIMER,  in the last 72 hours Hemoglobin A1C: No results found for this basename: HGBA1C,  in the last 72 hours Fasting Lipid Panel: No results found for this basename: CHOL, HDL, LDLCALC, TRIG, CHOLHDL, LDLDIRECT,  in the last 72 hours Thyroid Function Tests: No results found for this basename: TSH, T4TOTAL, FREET3, T3FREE, THYROIDAB,  in the last 72 hours   Radiology/Studies:  Dg Swallowing Func-speech Pathology  06/14/2014   Katherene Ponto [place],  CCC-SLP     06/14/2014 10:32 AM Objective Swallowing Evaluation: Modified Barium Swallowing Study   Patient Details  Name: Roy [name] MRN: [id number] Date of Birth: 10/10/1946  Today's Date: 06/14/2014 Time: 0950-1015 SLP Time Calculation (min): 25 min  Past Medical History:  Past Medical History  Diagnosis Date  . Cerebrovascular disease 2009    TIA; 2009- right ICA stent; re-intervention for restenosis  complicated by [facility] w/o sx  . Degenerative joint disease     Total shoulder arthroplasty-right  . LBBB (left bundle branch block)     Normal echo-2011; stress nuclear in 09/2010--septal  hypoperfusion representing nontransmural infarction or the effect  of left bundle branch block, no ischemia  . Hyperlipidemia   . Hypertension   . Chronic obstructive pulmonary disease   . Tobacco abuse     -100 pack years; 1.5 packs per day  . Anxiety and depression   . Alcohol abuse     6 beers per day; hospital admission in 2009 for withdrawal  symptoms  . Thrombocytopenia   . Tubular adenoma of colon    Past Surgical  History:  Past Surgical History  Procedure Laterality Date  . Lumbar fusion  2010  . Vasectomy  1971  . Colonoscopy w/ polypectomy  2011  . Total shoulder arthroplasty  2011    Right-Dr. Tamera Punt  . Colonoscopy  08/22/09    Fields-(Tubular Adenoma)3-mm transverse polyp/4-mm polyp  otherwise noraml/small internal hemorrhoids  . Esophagogastroduodenoscopy [name] 03/05/2014    Procedure: ESOPHAGOGASTRODUODENOSCOPY (EGD);  Surgeon: Danie Binder, MD;  [facility];  Service: Endoscopy;   Laterality: [name];  11:45  . Agile capsule [name] 03/18/2014    Procedure: AGILE CAPSULE;  Surgeon: Danie Binder, MD;   [facility];  Service: Endoscopy;  Laterality: [name];   7:30  . Givens capsule study [name] 03/30/2014    Procedure: GIVENS CAPSULE STUDY;  Surgeon: Danie Binder, MD;   [facility];  Service: Endoscopy;  Laterality: [name];   7:30  . Colonoscopy [name] 04/14/2014    Procedure: COLONOSCOPY;  Surgeon: Danie Binder, MD;   [facility];  Service: Endoscopy;  Laterality: [name];   9:23   HPI:  68 year old male, current smoker with PMH of COPD, alcohol abuse,  chronic bronchitis, TIA, HTN, LBBB who presented to [facility] ED via  EMS 7/9 for progressively worsening SOB and respiratory distress.  Transferred to [facility].  Intubated 7/10-7/17. NSTEMI     Assessment / Plan / Recommendation Clinical Impression  Dysphagia Diagnosis: Moderate oral phase dysphagia;Mild  pharyngeal phase dysphagia Clinical impression: Pt presents with a moderate oral dysphagia  impacting safety with PO. Pt is moderately confused, edentulous  and tremulous with very dry oral mucosa. With most boluses, pt  will briefly hold bolus orally with occasional lingual tremors  prior to active transit. Intermittently pt has premature spillage  of partial bolus to pyriforms and sometimes pt actively transits  bolus with a delayed swallow initiation and aspiration before the  swallow. Of note, pt often clears throat post swallow, unrelated  to  penetration or aspiration. Initially aspiration event was  silent, but then followed by late, hard, effective cough that  expelled thin aspirate. In some trials swallow was timely,  suggestive of a primary cognitive problem rather than a true  sensory deficit. Strength is WNL and trace residuals coating all  oropahryngeal structures likely due to dry mucosa. Recommend  nectar thick liquids as pt orally manages  this bolus best and has  decreased risk of aspiration before the swallow. Will also start  pt with a puree diet, though expect as mentation improves he may  tolerate soft solids and thin liquids well.     Treatment Recommendation  Therapy as outlined in treatment plan below    Diet Recommendation Dysphagia 1 (Puree);Nectar-thick liquid   Liquid Administration via: Cup;Straw Medication Administration: Crushed with puree Supervision: Staff to assist with self feeding Compensations: Slow rate;Small sips/bites Postural Changes and/or Swallow Maneuvers: Seated upright 90  degrees;Upright 30-60 min after meal    Other  Recommendations Oral Care Recommendations: Oral care BID Other Recommendations: Order thickener from pharmacy   Follow Up Recommendations  Skilled Nursing facility    Frequency and Duration min 2x/week  2 weeks   Pertinent Vitals/Pain NA    SLP Swallow Goals     General Date of Onset: 06/03/14 HPI: 68 year old male, current smoker with PMH of COPD, alcohol  abuse, chronic bronchitis, TIA, HTN, LBBB who presented to [facility] ED  via EMS 7/9 for progressively worsening SOB and respiratory  distress. Transferred to [facility].  Intubated 7/10-7/17. NSTEMI Type of Study: Modified Barium Swallowing Study Reason for Referral: Objectively evaluate swallowing function Previous Swallow Assessment: none per records Diet Prior to this Study: NPO Temperature Spikes Noted: No Respiratory Status: Room air History of Recent Intubation: Yes Length of Intubations (days): 7 days Date extubated: 06/11/14 Behavior/Cognition:  Alert;Cooperative;Decreased sustained  attention;Requires cueing;Distractible Oral Cavity - Dentition: Edentulous Oral Motor / Sensory Function: Within functional limits Self-Feeding Abilities: Needs assist Patient Positioning: Upright in chair Baseline Vocal Quality: Breathy;Hoarse;Low vocal intensity Volitional Cough: Strong Volitional Swallow: Able to elicit Anatomy:  (dry oral mucosa) Pharyngeal Secretions: Not observed secondary MBS    Reason for Referral Objectively evaluate swallowing function   Oral Phase Oral Preparation/Oral Phase Oral Phase: Impaired Oral - Nectar Oral - Nectar Cup: Lingual/palatal residue;Delayed oral  transit;Reduced posterior propulsion;Holding of bolus;Lingual  pumping Oral - Nectar Straw: Lingual/palatal residue;Delayed oral  transit;Reduced posterior propulsion;Holding of bolus;Lingual  pumping Oral - Thin Oral - Thin Cup: Lingual/palatal residue;Delayed oral  transit;Reduced posterior propulsion;Holding of bolus;Lingual  pumping Oral - Thin Straw: Lingual/palatal residue;Delayed oral  transit;Reduced posterior propulsion;Holding of bolus;Lingual  pumping Oral - Solids Oral - Puree: Lingual/palatal residue;Delayed oral  transit;Reduced posterior propulsion;Holding of bolus;Lingual  pumping Oral - Mechanical Soft: Lingual/palatal residue;Delayed oral  transit;Reduced posterior propulsion;Holding of bolus;Lingual  pumping;Piecemeal swallowing Oral - Pill: Lingual/palatal residue;Delayed oral transit;Reduced  posterior propulsion;Holding of bolus;Lingual pumping   Pharyngeal Phase Pharyngeal Phase Pharyngeal Phase: Impaired Pharyngeal - Nectar Pharyngeal - Nectar Cup: Delayed swallow initiation;Premature  spillage to valleculae Pharyngeal - Nectar Straw: Delayed swallow initiation;Premature  spillage to valleculae Pharyngeal - Thin Pharyngeal - Thin Cup: Delayed swallow initiation;Premature  spillage to pyriform sinuses Pharyngeal - Thin Straw: Delayed swallow initiation;Premature   spillage to pyriform sinuses;Penetration/Aspiration before  swallow;Moderate aspiration Penetration/Aspiration details (thin straw): Material enters  airway, passes BELOW cords without attempt by patient to eject  out (silent aspiration) Pharyngeal - Solids Pharyngeal - Puree: Delayed swallow initiation Pharyngeal - Mechanical Soft: Delayed swallow  initiation;Premature spillage to pyriform sinuses  Cervical Esophageal Phase    GO    Cervical Esophageal Phase Cervical Esophageal Phase: [facility]        Herbie Baltimore, MA CCC-SLP [phone]  [place], Katherene Ponto 06/14/2014, 10:27 AM     PHYSICAL EXAM General: Chronically ill appearing, elderly WM in no acute distress. Head: Normocephalic, atraumatic, sclera non-icteric, oropharynx is clear Neck: Negative  for carotid bruits. JVD not elevated. No adenopathy Lungs: Poor air movement.  Breathing is unlabored. Heart: Distant HS, RRR S1 S2 without murmurs, rubs, or gallops.  Abdomen: Soft, non-tender, non-distended with normoactive bowel sounds. No hepatomegaly. No rebound/guarding. No obvious abdominal masses. Msk:  Strength and tone appears normal for age. Extremities: Trace edema.  Distal pedal pulses are 2+ and equal bilaterally. Neuro: Alert but not oriented or conversant.  ASSESSMENT AND PLAN: 1. Acute on chronic respiratory failure with COPD. 2. Acute systolic CHF. EF 20%. Echo shows restrictive physiology. 3. Elevated troponin possibly due to demand ischemia versus primary obstructive CAD. Patient is not a candidate for further ischemic work up due to mental status changes and ARF. On metoprolol and plavix.  4. History of Etoh abuse 5. Cerebrovascular disease s/p intracranial stent. On Plavix. 6. PAD. 7. Acute renal failure Creatinine 1.14>3.96>4.60. Need to hold diuretics and ACEi/ARB. Consider renal consult. Not a candidate for contrast studies. Plans per primary team.  Present on Admission:  . Respiratory failure  Signed, Peter Martinique,  [place] 06/14/2014 11:20 AM

## 2014-06-14 NOTE — Progress Notes (Signed)
Physical Therapy Treatment Patient Details Name: Roy [name] MRN: [id number] DOB: [birth year] Today's Date: 06/14/2014    History of Present Illness Patient is a 68 yo male admitted 06/03/14 with respiratory failure, CHF/COPD, NSTEMI, acute encephalopaty (? ETOH w/d per chart).  Patient intubated 06/04/14, and extubated 06/11/14.  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     PT Comments    Pt awake, however with limited communication and limited following commands. Pt unsafe to place in chair (even with chair alarm) as he is constantly moving himself around in the bed per RN. Returned to bed for pt safety.    Follow Up Recommendations  LTACH;Supervision/Assistance - 24 hour     Equipment Recommendations  Rolling walker with 5" wheels    Recommendations for Other Services       Precautions / Restrictions Precautions Precautions: Fall Restrictions Weight Bearing Restrictions: No    Mobility  Bed Mobility Overal bed mobility: Needs Assistance;+2 for physical assistance Bed Mobility: Rolling;Sidelying to Sit;Sit to Sidelying Rolling: Mod assist Sidelying to sit: Mod assist     Sit to sidelying: Mod assist;+2 for physical assistance General bed mobility comments: pt required assist to raise torso to sitting; able to sit unsupported for up to 30 seconds, then pt will lean on to his Rt elbow; moved up and down from forearm propped position  Transfers Overall transfer level: Needs assistance Equipment used: Rolling walker (2 wheeled) Transfers: Sit to/from Stand Sit to Stand: Total assist;+2 physical assistance         General transfer comment: achieved ~1/2 standing with pt then giving poor effort and returned to sit EOB; pt attempting to lie back down and further standing attempts were deferred  Ambulation/Gait                 Stairs            Wheelchair  Mobility    Modified Rankin (Stroke Patients Only)       Balance Overall balance assessment: Needs assistance Sitting-balance support: No upper extremity supported;Feet unsupported Sitting balance-Leahy Scale: Fair   Postural control: Right lateral lean (at times; ? fatigue) Standing balance support: Bilateral upper extremity supported Standing balance-Leahy Scale: Zero Standing balance comment: unable to achieve full standing                    Cognition Arousal/Alertness: Awake/alert Behavior During Therapy: Anxious;Restless;Flat affect Overall Cognitive Status: Difficult to assess Area of Impairment: Following commands;Safety/judgement;Orientation;Attention;Problem solving;Awareness Orientation Level: Disoriented to;Place;Situation;Time Current Attention Level: Sustained (barely long enough to hold onto RW to attempt standing)   Following Commands: Follows one step commands inconsistently Safety/Judgement: Decreased awareness of safety;Decreased awareness of deficits Awareness:  (not yet intellectual) Problem Solving: Slow processing;Difficulty sequencing;Requires verbal cues;Requires tactile cues General Comments: pt lying diagonal across bed with both lower legs off the side of bed on arrival; nearly aphonic (barely whispers)    Exercises      General Comments        Pertinent Vitals/Pain SaO2 100% on RA Grimaced when repositioned up to [facility] (in supine), however could not indicate/state if he was in pain    Home Living                      Prior Function            PT Goals (current goals can now be found in the  care plan section) Acute Rehab PT Goals Patient Stated Goal: None stated  Progress towards PT goals: Progressing toward goals    Frequency  Min 3X/week    PT Plan Current plan remains appropriate    Co-evaluation             End of Session Equipment Utilized During Treatment: Gait belt Activity Tolerance: Patient limited  by fatigue (limited by decr cognition) Patient left: in bed;with call bell/phone within reach;with bed alarm set     Time: 1131-1154 PT Time Calculation (min): 23 min  Charges:  $Therapeutic Activity: 23-37 mins                    G Codes:      Roy [name] Jun 16, 2014, 12:56 PM Pager [phone]

## 2014-06-14 NOTE — Procedures (Signed)
Objective Swallowing Evaluation: Modified Barium Swallowing Study  Patient Details  Name: Roy [name] MRN: [id number] Date of Birth: 08/16/1946  Today's Date: 06/14/2014 Time: 0950-1015 SLP Time Calculation (min): 25 min  Past Medical History:  Past Medical History  Diagnosis Date  . Cerebrovascular disease 2009    TIA; 2009- right ICA stent; re-intervention for restenosis complicated by [facility] w/o sx  . Degenerative joint disease     Total shoulder arthroplasty-right  . LBBB (left bundle branch block)     Normal echo-2011; stress nuclear in 09/2010--septal hypoperfusion representing nontransmural infarction or the effect of left bundle branch block, no ischemia  . Hyperlipidemia   . Hypertension   . Chronic obstructive pulmonary disease   . Tobacco abuse     -100 pack years; 1.5 packs per day  . Anxiety and depression   . Alcohol abuse     6 beers per day; hospital admission in 2009 for withdrawal symptoms  . Thrombocytopenia   . Tubular adenoma of colon    Past Surgical History:  Past Surgical History  Procedure Laterality Date  . Lumbar fusion  2010  . Vasectomy  1971  . Colonoscopy w/ polypectomy  2011  . Total shoulder arthroplasty  2011    Right-Dr. Tamera Punt  . Colonoscopy  08/22/09    Fields-(Tubular Adenoma)3-mm transverse polyp/4-mm polyp otherwise noraml/small internal hemorrhoids  . Esophagogastroduodenoscopy [name] 03/05/2014    Procedure: ESOPHAGOGASTRODUODENOSCOPY (EGD);  Surgeon: Danie Binder, MD;  [facility];  Service: Endoscopy;  Laterality: [name];  11:45  . Agile capsule [name] 03/18/2014    Procedure: AGILE CAPSULE;  Surgeon: Danie Binder, MD;  [facility];  Service: Endoscopy;  Laterality: [name];  7:30  . Givens capsule study [name] 03/30/2014    Procedure: GIVENS CAPSULE STUDY;  Surgeon: Danie Binder, MD;  [facility];  Service: Endoscopy;  Laterality: [name];  7:30  . Colonoscopy [name] 04/14/2014    Procedure: COLONOSCOPY;   Surgeon: Danie Binder, MD;  [facility];  Service: Endoscopy;  Laterality: [name];  9:22   HPI:  68 year old male, current smoker with PMH of COPD, alcohol abuse, chronic bronchitis, TIA, HTN, LBBB who presented to [facility] ED via EMS 7/9 for progressively worsening SOB and respiratory distress. Transferred to [facility]  Intubated 7/10-7/17. NSTEMI     Assessment / Plan / Recommendation Clinical Impression  Dysphagia Diagnosis: Moderate oral phase dysphagia;Mild pharyngeal phase dysphagia Clinical impression: Pt presents with a moderate oral dysphagia impacting safety with PO. Pt is moderately confused, edentulous and tremulous with very dry oral mucosa. With most boluses, pt will briefly hold bolus orally with occasional lingual tremors prior to active transit. Intermittently pt has premature spillage of partial bolus to pyriforms and sometimes pt actively transits bolus with a delayed swallow initiation and aspiration before the swallow. Of note, pt often clears throat post swallow, unrelated to penetration or aspiration. Initially aspiration event was silent, but then followed by late, hard, effective cough that expelled thin aspirate. In some trials swallow was timely, suggestive of a primary cognitive problem rather than a true sensory deficit. Strength is WNL and trace residuals coating all oropahryngeal structures likely due to dry mucosa. Recommend nectar thick liquids as pt orally manages this bolus best and has decreased risk of aspiration before the swallow. Will also start pt with a puree diet, though expect as mentation improves he may tolerate soft solids and thin liquids well.     Treatment Recommendation  Therapy as outlined in treatment plan below    Diet Recommendation Dysphagia 1 (Puree);Nectar-thick liquid   Liquid Administration via: Cup;Straw Medication Administration: Crushed with puree Supervision: Staff to assist with self feeding Compensations: Slow rate;Small  sips/bites Postural Changes and/or Swallow Maneuvers: Seated upright 90 degrees;Upright 30-60 min after meal    Other  Recommendations Oral Care Recommendations: Oral care BID Other Recommendations: Order thickener from pharmacy   Follow Up Recommendations  Skilled Nursing facility    Frequency and Duration min 2x/week  2 weeks   Pertinent Vitals/Pain NA    SLP Swallow Goals     General Date of Onset: 06/03/14 HPI: 68 year old male, current smoker with PMH of COPD, alcohol abuse, chronic bronchitis, TIA, HTN, LBBB who presented to [facility] ED via EMS 7/9 for progressively worsening SOB and respiratory distress. Transferred to [facility]  Intubated 7/10-7/17. NSTEMI Type of Study: Modified Barium Swallowing Study Reason for Referral: Objectively evaluate swallowing function Previous Swallow Assessment: none per records Diet Prior to this Study: NPO Temperature Spikes Noted: No Respiratory Status: Room air History of Recent Intubation: Yes Length of Intubations (days): 7 days Date extubated: 06/11/14 Behavior/Cognition: Alert;Cooperative;Decreased sustained attention;Requires cueing;Distractible Oral Cavity - Dentition: Edentulous Oral Motor / Sensory Function: Within functional limits Self-Feeding Abilities: Needs assist Patient Positioning: Upright in chair Baseline Vocal Quality: Breathy;Hoarse;Low vocal intensity Volitional Cough: Strong Volitional Swallow: Able to elicit Anatomy:  (dry oral mucosa) Pharyngeal Secretions: Not observed secondary MBS    Reason for Referral Objectively evaluate swallowing function   Oral Phase Oral Preparation/Oral Phase Oral Phase: Impaired Oral - Nectar Oral - Nectar Cup: Lingual/palatal residue;Delayed oral transit;Reduced posterior propulsion;Holding of bolus;Lingual pumping Oral - Nectar Straw: Lingual/palatal residue;Delayed oral transit;Reduced posterior propulsion;Holding of bolus;Lingual pumping Oral - Thin Oral - Thin Cup:  Lingual/palatal residue;Delayed oral transit;Reduced posterior propulsion;Holding of bolus;Lingual pumping Oral - Thin Straw: Lingual/palatal residue;Delayed oral transit;Reduced posterior propulsion;Holding of bolus;Lingual pumping Oral - Solids Oral - Puree: Lingual/palatal residue;Delayed oral transit;Reduced posterior propulsion;Holding of bolus;Lingual pumping Oral - Mechanical Soft: Lingual/palatal residue;Delayed oral transit;Reduced posterior propulsion;Holding of bolus;Lingual pumping;Piecemeal swallowing Oral - Pill: Lingual/palatal residue;Delayed oral transit;Reduced posterior propulsion;Holding of bolus;Lingual pumping   Pharyngeal Phase Pharyngeal Phase Pharyngeal Phase: Impaired Pharyngeal - Nectar Pharyngeal - Nectar Cup: Delayed swallow initiation;Premature spillage to valleculae Pharyngeal - Nectar Straw: Delayed swallow initiation;Premature spillage to valleculae Pharyngeal - Thin Pharyngeal - Thin Cup: Delayed swallow initiation;Premature spillage to pyriform sinuses Pharyngeal - Thin Straw: Delayed swallow initiation;Premature spillage to pyriform sinuses;Penetration/Aspiration before swallow;Moderate aspiration Penetration/Aspiration details (thin straw): Material enters airway, passes BELOW cords without attempt by patient to eject out (silent aspiration) Pharyngeal - Solids Pharyngeal - Puree: Delayed swallow initiation Pharyngeal - Mechanical Soft: Delayed swallow initiation;Premature spillage to pyriform sinuses  Cervical Esophageal Phase    GO    Cervical Esophageal Phase Cervical Esophageal Phase: [facility]        Herbie Baltimore, MA CCC-SLP [phone]  Janei Scheff, Katherene Ponto 06/14/2014, 10:27 AM

## 2014-06-15 ENCOUNTER — Encounter: Payer: Self-pay | Admitting: *Deleted

## 2014-06-15 DIAGNOSIS — J441 Chronic obstructive pulmonary disease with (acute) exacerbation: Secondary | ICD-10-CM

## 2014-06-15 DIAGNOSIS — I5022 Chronic systolic (congestive) heart failure: Secondary | ICD-10-CM | POA: Diagnosis present

## 2014-06-15 DIAGNOSIS — N179 Acute kidney failure, unspecified: Secondary | ICD-10-CM | POA: Diagnosis present

## 2014-06-15 DIAGNOSIS — R195 Other fecal abnormalities: Secondary | ICD-10-CM | POA: Diagnosis not present

## 2014-06-15 DIAGNOSIS — E870 Hyperosmolality and hypernatremia: Secondary | ICD-10-CM | POA: Diagnosis present

## 2014-06-15 DIAGNOSIS — G9340 Encephalopathy, unspecified: Secondary | ICD-10-CM

## 2014-06-15 LAB — SODIUM, URINE, RANDOM: SODIUM UR: 67 meq/L

## 2014-06-15 LAB — GLUCOSE, CAPILLARY
GLUCOSE-CAPILLARY: 114 mg/dL — AB (ref 70–99)
GLUCOSE-CAPILLARY: 138 mg/dL — AB (ref 70–99)
Glucose-Capillary: 145 mg/dL — ABNORMAL HIGH (ref 70–99)

## 2014-06-15 LAB — BASIC METABOLIC PANEL
ANION GAP: 21 — AB (ref 5–15)
ANION GAP: 26 — AB (ref 5–15)
ANION GAP: 26 — AB (ref 5–15)
ANION GAP: 25 — AB (ref 5–15)
Anion gap: 23 — ABNORMAL HIGH (ref 5–15)
BUN: 184 mg/dL — ABNORMAL HIGH (ref 6–23)
BUN: 178 mg/dL — ABNORMAL HIGH (ref 6–23)
BUN: 171 mg/dL — ABNORMAL HIGH (ref 6–23)
BUN: 172 mg/dL — ABNORMAL HIGH (ref 6–23)
BUN: 163 mg/dL — AB (ref 6–23)
CHLORIDE: 116 meq/L — AB (ref 96–112)
CHLORIDE: 117 meq/L — AB (ref 96–112)
CHLORIDE: 119 meq/L — AB (ref 96–112)
CO2: 21 mEq/L (ref 19–32)
CO2: 20 mEq/L (ref 19–32)
CO2: 19 meq/L (ref 19–32)
CO2: 17 mEq/L — ABNORMAL LOW (ref 19–32)
CO2: 18 mEq/L — ABNORMAL LOW (ref 19–32)
CREATININE: 4.88 mg/dL — AB (ref 0.50–1.35)
CREATININE: 4.61 mg/dL — AB (ref 0.50–1.35)
CREATININE: 3.87 mg/dL — AB (ref 0.50–1.35)
Calcium: 9.6 mg/dL (ref 8.4–10.5)
Calcium: 9.4 mg/dL (ref 8.4–10.5)
Calcium: 9.4 mg/dL (ref 8.4–10.5)
Calcium: 9.5 mg/dL (ref 8.4–10.5)
Calcium: 9.4 mg/dL (ref 8.4–10.5)
Chloride: 113 mEq/L — ABNORMAL HIGH (ref 96–112)
Chloride: 117 mEq/L — ABNORMAL HIGH (ref 96–112)
Creatinine, Ser: 4.38 mg/dL — ABNORMAL HIGH (ref 0.50–1.35)
Creatinine, Ser: 4.23 mg/dL — ABNORMAL HIGH (ref 0.50–1.35)
GFR calc Af Amer: 15 mL/min — ABNORMAL LOW (ref >90)
GFR calc Af Amer: 15 mL/min — ABNORMAL LOW (ref >90)
GFR calc Af Amer: 17 mL/min — ABNORMAL LOW (ref >90)
GFR calc non Af Amer: 13 mL/min — ABNORMAL LOW (ref >90)
GFR calc non Af Amer: 13 mL/min — ABNORMAL LOW (ref >90)
GFR, EST AFRICAN AMERICAN: 13 mL/min — AB (ref >90)
GFR, EST AFRICAN AMERICAN: 14 mL/min — AB (ref >90)
GFR, EST NON AFRICAN AMERICAN: 11 mL/min — AB (ref >90)
GFR, EST NON AFRICAN AMERICAN: 12 mL/min — AB (ref >90)
GFR, EST NON AFRICAN AMERICAN: 15 mL/min — AB (ref >90)
GLUCOSE: 156 mg/dL — AB (ref 70–99)
GLUCOSE: 201 mg/dL — AB (ref 70–99)
Glucose, Bld: 131 mg/dL — ABNORMAL HIGH (ref 70–99)
Glucose, Bld: 123 mg/dL — ABNORMAL HIGH (ref 70–99)
Glucose, Bld: 126 mg/dL — ABNORMAL HIGH (ref 70–99)
POTASSIUM: 3.6 meq/L — AB (ref 3.7–5.3)
POTASSIUM: 3.7 meq/L (ref 3.7–5.3)
POTASSIUM: 3.6 meq/L — AB (ref 3.7–5.3)
Potassium: 3.8 mEq/L (ref 3.7–5.3)
Potassium: 3.6 mEq/L — ABNORMAL LOW (ref 3.7–5.3)
SODIUM: 161 meq/L — AB (ref 137–147)
Sodium: 155 mEq/L — ABNORMAL HIGH (ref 137–147)
Sodium: 162 mEq/L — ABNORMAL HIGH (ref 137–147)
Sodium: 162 mEq/L — ABNORMAL HIGH (ref 137–147)
Sodium: 158 mEq/L — ABNORMAL HIGH (ref 137–147)

## 2014-06-15 LAB — CBC
HCT: 34.4 % — ABNORMAL LOW (ref 39.0–52.0)
HEMOGLOBIN: 10.8 g/dL — AB (ref 13.0–17.0)
MCH: 28.1 pg (ref 26.0–34.0)
MCHC: 31.4 g/dL (ref 30.0–36.0)
MCV: 89.4 fL (ref 78.0–100.0)
Platelets: 500 10*3/uL — ABNORMAL HIGH (ref 150–400)
RBC: 3.85 MIL/uL — ABNORMAL LOW (ref 4.22–5.81)
RDW: 15.8 % — ABNORMAL HIGH (ref 11.5–15.5)
WBC: 13.4 10*3/uL — ABNORMAL HIGH (ref 4.0–10.5)

## 2014-06-15 LAB — CREATININE, URINE, RANDOM: CREATININE, URINE: 71.31 mg/dL

## 2014-06-15 LAB — CLOSTRIDIUM DIFFICILE BY PCR: CDIFFPCR: NEGATIVE

## 2014-06-15 LAB — OSMOLALITY, URINE: Osmolality, Ur: 489 mOsm/kg (ref 390–1090)

## 2014-06-15 LAB — OSMOLALITY: Osmolality: 397 mOsm/kg — ABNORMAL HIGH (ref 275–300)

## 2014-06-15 MED ORDER — STARCH (THICKENING) PO POWD
ORAL | Status: DC | PRN
  Filled 2014-06-15: qty 227

## 2014-06-15 MED ORDER — IPRATROPIUM-ALBUTEROL 0.5-2.5 (3) MG/3ML IN SOLN
3.0000 mL | Freq: Two times a day (BID) | RESPIRATORY_TRACT | Status: DC
  Administered 2014-06-15 – 2014-06-17 (×4): 3 mL via RESPIRATORY_TRACT
  Filled 2014-06-15 (×3): qty 3

## 2014-06-15 MED ORDER — LORAZEPAM 2 MG/ML IJ SOLN
1.0000 mg | INTRAMUSCULAR | Status: DC | PRN
  Administered 2014-06-16 – 2014-06-20 (×7): 1 mg via INTRAVENOUS
  Administered 2014-06-20: 11:00:00 via INTRAVENOUS
  Administered 2014-06-21: 1 mg via INTRAVENOUS
  Filled 2014-06-15 (×10): qty 1

## 2014-06-15 MED ORDER — DEXTROSE 5 % IV SOLN: INTRAVENOUS | Status: DC

## 2014-06-15 MED ORDER — DEXTROSE 5 % IV SOLN
INTRAVENOUS | Status: DC
  Administered 2014-06-15: 11:00:00 via INTRAVENOUS
  Administered 2014-06-15: 125 mL via INTRAVENOUS
  Administered 2014-06-16: 20:00:00 via INTRAVENOUS
  Administered 2014-06-16: 500 mL via INTRAVENOUS
  Administered 2014-06-17: 04:00:00 via INTRAVENOUS

## 2014-06-15 MED ORDER — NICOTINE 21 MG/24HR TD PT24
21.0000 mg | MEDICATED_PATCH | Freq: Every day | TRANSDERMAL | Status: DC
  Administered 2014-06-15 – 2014-06-22 (×8): 21 mg via TRANSDERMAL
  Filled 2014-06-15 (×8): qty 1

## 2014-06-15 MED ORDER — LORAZEPAM 2 MG/ML IJ SOLN
1.0000 mg | Freq: Three times a day (TID) | INTRAMUSCULAR | Status: DC
  Administered 2014-06-15 – 2014-06-16 (×5): 1 mg via INTRAVENOUS
  Filled 2014-06-15 (×5): qty 1

## 2014-06-15 NOTE — Progress Notes (Signed)
[facility] Progress Note    Assessment:  1. Acute kidney injury - appears to be volume related although certainly possible he has developed ATN since hospitalization. His MAP has also dropped since ~7/18 with intermittent and sometimes persistent hypotensive episodes.  2. Hypernatremia - related to volume status and patient unlikely to have been consuming much free water. Currently his free water deficit is 4.7L. Of note his weight has decreased from 69kg at admission to 58kg which is not consistent with the net fluid I&O's even accounting basal fluid losses. If no improvement will start workup for diabetes insipidus but diaper is not that wet suggesting that there is not a large amount of free water loss through the urine.  - over night his sodium was dropping nicely and now suddenly it's elevated. He does not have a foley in place making I&O's very difficult. But reportedly he has had loose bowel movements. 3. 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 COPD 5. Confusion Plan  1.  I am going to treat this as a chronic hypernatremia. Free water repletion with d5w at 181ml/hr would result in a Sna of 140 within 24hrs assuming no further free water loss.  2. Because of his history of CHF with an EF of 20% and subacute history of hypernatremia I started gently yesterday with  1/2NS at 127ml/hr. I don't have a great explanation for why he had a good response and then suddenly spiked up.  3. I will check a stat chemistry panel, start on d5w at 159ml/hr and then recheck a stat chemistry 4 hrs later --> adjust as needed. Goal will be to drop the SNa by ~8-10 over the next 24 hrs. 4. The renal function should improve with hydration as he's still markedly volume down if the weights are to be believed (down 11kg since hospitalization). 5. Post void bladder scan was not impressive. 6. Urine and serum osmolality; will not check Urine Na and K to calculate free water loss in urine as we would need a foley for urine  output.    Subjective:   Confused with tremors. Unclear if ROS is accurate at all as he answers by nodding yes to almost every question. Per nursing staff he had a large loose bowel movement this AM.   Objective:   BP 131/68  Pulse 93  Temp(Src) 97.3 F (36.3 C) (Oral)  Resp 20  Ht 5\' 6"  (1.676 m)  Wt 60.2 kg (132 lb 11.5 oz)  BMI 21.43 kg/m2  SpO2 99%  Intake/Output Summary (Last 24 hours) at 06/15/14 0813 Last data filed at 06/15/14 0715  Gross per 24 hour  Intake 2003.33 ml  Output      1 ml  Net 2002.33 ml   Weight change: 0.2 kg (7.1 oz)  Physical Exam: General appearance: appears stated age, cachectic, distracted and slowed mentation  Head: Normocephalic, without obvious abnormality, atraumatic  Eyes: negative  Throat: normal findings: palate normal and tongue midline and normal  Neck: no adenopathy, no carotid bruit, no JVD, supple, symmetrical, trachea midline and thyroid not enlarged, symmetric, no tenderness/mass/nodules  Back: negative, symmetric, no curvature. ROM normal. No CVA tenderness.  Resp: diminished breath sounds anterior - bilateral and bibasilar  Chest wall: no tenderness  Cardio: S1, S2 normal  GI: soft, non-tender; bowel sounds normal; no masses, no organomegaly  Male genitalia: normal  Extremities: extremities normal, atraumatic, no cyanosis or edema  Neurologic: Mental status:confused   Imaging: Dg Swallowing Func-speech Pathology  06/14/2014  Katherene Ponto Deblois, CCC-SLP     06/14/2014 10:32 AM Objective Swallowing Evaluation: Modified Barium Swallowing Study   Patient Details  Name: Roy [name] MRN: [id number] Date of Birth: [birth year]  Today's Date: 06/14/2014 Time: 0950-1015 SLP Time Calculation (min): 25 min  Past Medical History:  Past Medical History  Diagnosis Date  . Cerebrovascular disease 2009    TIA; 2009- right ICA stent; re-intervention for restenosis  complicated by [facility] w/o sx  . Degenerative joint disease     Total shoulder  arthroplasty-right  . LBBB (left bundle branch block)     Normal echo-2011; stress nuclear in 09/2010--septal  hypoperfusion representing nontransmural infarction or the effect  of left bundle branch block, no ischemia  . Hyperlipidemia   . Hypertension   . Chronic obstructive pulmonary disease   . Tobacco abuse     -100 pack years; 1.5 packs per day  . Anxiety and depression   . Alcohol abuse     6 beers per day; hospital admission in 2009 for withdrawal  symptoms  . Thrombocytopenia   . Tubular adenoma of colon    Past Surgical History:  Past Surgical History  Procedure Laterality Date  . Lumbar fusion  2010  . Vasectomy  1971  . Colonoscopy w/ polypectomy  2011  . Total shoulder arthroplasty  2011    Right-Dr. Tamera Punt  . Colonoscopy  08/22/09    Fields-(Tubular Adenoma)3-mm transverse polyp/4-mm polyp  otherwise noraml/small internal hemorrhoids  . Esophagogastroduodenoscopy [name] 03/05/2014    Procedure: ESOPHAGOGASTRODUODENOSCOPY (EGD);  Surgeon: Danie Binder, MD;  [facility]: [facility];  Service: Endoscopy;   Laterality: [name];  11:45  . Agile capsule [name] 03/18/2014    Procedure: AGILE CAPSULE;  Surgeon: Danie Binder, MD;   Location: [facility];  Service: Endoscopy;  Laterality: [name];   7:30  . Givens capsule study [name] 03/30/2014    Procedure: GIVENS CAPSULE STUDY;  Surgeon: Danie Binder, MD;   Location: [facility];  Service: Endoscopy;  Laterality: [name];   7:30  . Colonoscopy [name] 04/14/2014    Procedure: COLONOSCOPY;  Surgeon: Danie Binder, MD;   Location: [facility];  Service: Endoscopy;  Laterality: [name];   9:87   HPI:  68 year old male, current smoker with PMH of COPD, alcohol abuse,  chronic bronchitis, TIA, HTN, LBBB who presented to [facility] ED via  EMS 7/9 for progressively worsening SOB and respiratory distress.  Transferred to [facility]  Intubated 7/10-7/17. NSTEMI     Assessment / Plan / Recommendation Clinical Impression  Dysphagia Diagnosis: Moderate oral phase dysphagia;Mild  pharyngeal phase  dysphagia Clinical impression: Pt presents with a moderate oral dysphagia  impacting safety with PO. Pt is moderately confused, edentulous  and tremulous with very dry oral mucosa. With most boluses, pt  will briefly hold bolus orally with occasional lingual tremors  prior to active transit. Intermittently pt has premature spillage  of partial bolus to pyriforms and sometimes pt actively transits  bolus with a delayed swallow initiation and aspiration before the  swallow. Of note, pt often clears throat post swallow, unrelated  to penetration or aspiration. Initially aspiration event was  silent, but then followed by late, hard, effective cough that  expelled thin aspirate. In some trials swallow was timely,  suggestive of a primary cognitive problem rather than a true  sensory deficit. Strength is WNL and trace residuals coating all  oropahryngeal structures likely due to dry mucosa. Recommend  nectar thick liquids as  pt orally manages this bolus best and has  decreased risk of aspiration before the swallow. Will also start  pt with a puree diet, though expect as mentation improves he may  tolerate soft solids and thin liquids well.     Treatment Recommendation  Therapy as outlined in treatment plan below    Diet Recommendation Dysphagia 1 (Puree);Nectar-thick liquid   Liquid Administration via: Cup;Straw Medication Administration: Crushed with puree Supervision: Staff to assist with self feeding Compensations: Slow rate;Small sips/bites Postural Changes and/or Swallow Maneuvers: Seated upright 90  degrees;Upright 30-60 min after meal    Other  Recommendations Oral Care Recommendations: Oral care BID Other Recommendations: Order thickener from pharmacy   Follow Up Recommendations  Skilled Nursing facility    Frequency and Duration min 2x/week  2 weeks   Pertinent Vitals/Pain NA    SLP Swallow Goals     General Date of Onset: 06/03/14 HPI: 68 year old male, current smoker with PMH of COPD, alcohol  abuse, chronic  bronchitis, TIA, HTN, LBBB who presented to [facility] ED  via EMS 7/9 for progressively worsening SOB and respiratory  distress. Transferred to [facility].  Intubated 7/10-7/17. NSTEMI Type of Study: Modified Barium Swallowing Study Reason for Referral: Objectively evaluate swallowing function Previous Swallow Assessment: none per records Diet Prior to this Study: NPO Temperature Spikes Noted: No Respiratory Status: Room air History of Recent Intubation: Yes Length of Intubations (days): 7 days Date extubated: 06/11/14 Behavior/Cognition: Alert;Cooperative;Decreased sustained  attention;Requires cueing;Distractible Oral Cavity - Dentition: Edentulous Oral Motor / Sensory Function: Within functional limits Self-Feeding Abilities: Needs assist Patient Positioning: Upright in chair Baseline Vocal Quality: Breathy;Hoarse;Low vocal intensity Volitional Cough: Strong Volitional Swallow: Able to elicit Anatomy:  (dry oral mucosa) Pharyngeal Secretions: Not observed secondary MBS    Reason for Referral Objectively evaluate swallowing function   Oral Phase Oral Preparation/Oral Phase Oral Phase: Impaired Oral - Nectar Oral - Nectar Cup: Lingual/palatal residue;Delayed oral  transit;Reduced posterior propulsion;Holding of bolus;Lingual  pumping Oral - Nectar Straw: Lingual/palatal residue;Delayed oral  transit;Reduced posterior propulsion;Holding of bolus;Lingual  pumping Oral - Thin Oral - Thin Cup: Lingual/palatal residue;Delayed oral  transit;Reduced posterior propulsion;Holding of bolus;Lingual  pumping Oral - Thin Straw: Lingual/palatal residue;Delayed oral  transit;Reduced posterior propulsion;Holding of bolus;Lingual  pumping Oral - Solids Oral - Puree: Lingual/palatal residue;Delayed oral  transit;Reduced posterior propulsion;Holding of bolus;Lingual  pumping Oral - Mechanical Soft: Lingual/palatal residue;Delayed oral  transit;Reduced posterior propulsion;Holding of bolus;Lingual  pumping;Piecemeal swallowing Oral - Pill:  Lingual/palatal residue;Delayed oral transit;Reduced  posterior propulsion;Holding of bolus;Lingual pumping   Pharyngeal Phase Pharyngeal Phase Pharyngeal Phase: Impaired Pharyngeal - Nectar Pharyngeal - Nectar Cup: Delayed swallow initiation;Premature  spillage to valleculae Pharyngeal - Nectar Straw: Delayed swallow initiation;Premature  spillage to valleculae Pharyngeal - Thin Pharyngeal - Thin Cup: Delayed swallow initiation;Premature  spillage to pyriform sinuses Pharyngeal - Thin Straw: Delayed swallow initiation;Premature  spillage to pyriform sinuses;Penetration/Aspiration before  swallow;Moderate aspiration Penetration/Aspiration details (thin straw): Material enters  airway, passes BELOW cords without attempt by patient to eject  out (silent aspiration) Pharyngeal - Solids Pharyngeal - Puree: Delayed swallow initiation Pharyngeal - Mechanical Soft: Delayed swallow  initiation;Premature spillage to pyriform sinuses  Cervical Esophageal Phase    GO    Cervical Esophageal Phase Cervical Esophageal Phase: [facility]        Herbie Baltimore, MA CCC-SLP [phone]  Lynann Beaver 06/14/2014, 10:27 AM     Labs: BMET  Recent Labs Lab 06/09/14 0255  06/10/14 0246  06/11/14 0306 06/11/14 0940 06/12/14 0311 06/13/14  1307 06/14/14 0350 06/14/14 1814 06/14/14 2242 06/15/14 0546  NA 140  < > 147  < > 144 145 147 156* 156* 158* 155* 162*  K 3.8  < > 3.3*  < > 4.1 3.4* 3.6* 3.8 3.8 4.1 3.6* 3.7  CL 90*  < > 92*  < > 93* 92* 94* 103 107 115* 113* 116*  CO2 29  < > 31  < > 29 29 31 22 22 20 21 20   GLUCOSE 168*  < > 185*  < > 175* 165* 146* 129* 152* 130* 131* 123*  BUN 32*  < > 66*  < > 100* 106* 110* 161* 177* 184* 184* 178*  CREATININE 0.67  < > 1.02  < > 1.18 1.14 1.14 3.96* 4.60* 4.96* 4.88* 4.61*  CALCIUM 9.9  < > 10.6*  < > 10.6* 10.7* 11.0* 10.3 9.9 9.9 9.6 9.4  PHOS 7.9*  --  7.1*  --  6.4*  --  5.5*  --   --   --   --   --   < > = values in this interval not displayed. CBC  Recent  Labs Lab 06/09/14 0255 06/10/14 0246  06/12/14 0311 06/13/14 0220 06/14/14 0350 06/15/14 0546  WBC 13.3* 18.8*  < > 13.7* 16.5* 15.5* 13.4*  NEUTROABS 10.3* 15.2*  --   --   --   --   --   HGB 11.4* 12.8*  < > 13.5 12.7* 11.9* 10.8*  HCT 34.1* 39.6  < > 42.0 39.1 38.1* 34.4*  MCV 85.7 88.8  < > 90.7 89.7 90.3 89.4  PLT 436* 536*  < > 699* 791* 581* 500*  < > = values in this interval not displayed.  Medications:    . antiseptic oral rinse  15 mL Mouth Rinse QID  . aspirin  81 mg Oral Daily  . aspirin  81 mg Oral Pre-Cath  . atorvastatin  80 mg Oral q1800  . chlorhexidine  15 mL Mouth Rinse BID  . clopidogrel  75 mg Oral Daily  . folic acid  1 mg Oral Daily  . heparin subcutaneous  5,000 Units Subcutaneous 3 times per day  . insulin aspart  0-15 Units Subcutaneous TID WC  . insulin aspart  0-5 Units Subcutaneous QHS  . ipratropium-albuterol  3 mL Nebulization BID  . metoprolol tartrate  25 mg Oral TID  . risperiDONE  0.5 mg Oral q morning - 10a  . risperiDONE  1 mg Oral QHS  . sodium chloride  3 mL Intravenous Q12H  . thiamine  100 mg Oral Daily      Otelia Santee, MD 06/15/2014, 8:13 AM

## 2014-06-15 NOTE — Progress Notes (Signed)
Speech Language Pathology Treatment: Dysphagia  Patient Details Name: YEHYA BRENDLE MRN: [id number] DOB: 02/19/1946 Today's Date: 06/15/2014 Time: 8250-5397 SLP Time Calculation (min): 20 min  Assessment / Plan / Recommendation Clinical Impression  Provided results of MBS to wife, discussed that primary barrier to normal swallowing appears to be tremors and cognition. Instructed wife in thickening technique. Minimal trials of nectar thick liquid given without evidence of aspiration. When pt ready to resume diet per MD, write puree and nectar. Pt not ready for upgrade at this time.    HPI HPI: 68 year old male, current smoker with PMH of COPD, alcohol abuse, chronic bronchitis, TIA, HTN, LBBB who presented to [facility] ED via EMS 7/9 for progressively worsening SOB and respiratory distress. Transferred to [facility].  Intubated 7/10-7/17. NSTEMI   Pertinent Vitals NA  SLP Plan  Continue with current plan of care    Recommendations Diet recommendations: Dysphagia 1 (puree);Nectar-thick liquid Liquids provided via: Cup Supervision: Staff to assist with self feeding Compensations: Slow rate;Small sips/bites Postural Changes and/or Swallow Maneuvers: Seated upright 90 degrees;Upright 30-60 min after meal              Plan: Continue with current plan of care    GO    [facility], MA CCC-SLP [phone]  Lynann Beaver 06/15/2014, 4:42 PM

## 2014-06-15 NOTE — Progress Notes (Signed)
Subjective: foul smelling stool per RN, C diff culture sent. Pt not answering questions.    Objective: Vital signs in last 24 hours: Temp:  [97.3 F (36.3 C)-98.3 F (36.8 C)] 97.3 F (36.3 C) (07/21 0714) Pulse Rate:  [74-93] 93 (07/21 0714) Resp:  [18-20] 20 (07/21 0714) BP: (99-131)/(47-68) 131/68 mmHg (07/21 0714) SpO2:  [93 %-99 %] 99 % (07/21 0714) Weight:  [132 lb 11.5 oz (60.2 kg)] 132 lb 11.5 oz (60.2 kg) (07/21 0714) Weight change: 7.1 oz (0.2 kg) Last BM Date: 06/14/14 Intake/Output from previous day:  +2003 (-1434 since admit) 132.11 lbs, down from 150 07/20 0701 - 07/21 0700 In: 2003.3 [P.O.:60; I.V.:1943.3] Out: -  Intake/Output this shift:    PE: General:Pleasant affect, NAD Skin:Warm and dry, brisk capillary refill HEENT:normocephalic, sclera clear, mucus membranes moist Neck:supple, no JVD  Heart:S1S2 RRR without murmur, gallup, rub or click Lungs:clear without rales, rhonchi, or wheezes XBM:WUXL, non tender, + BS, do not palpate liver spleen or masses Ext:no lower ext edema Neuro:alert and oriented, MAE, follows commands, + facial symmetry  tele:  SR  Lab Results:  Recent Labs  06/14/14 0350 06/15/14 0546  WBC 15.5* 13.4*  HGB 11.9* 10.8*  HCT 38.1* 34.4*  PLT 581* 500*   BMET  Recent Labs  06/14/14 2242 06/15/14 0546  NA 155* 162*  K 3.6* 3.7  CL 113* 116*  CO2 21 20  GLUCOSE 131* 123*  BUN 184* 178*  CREATININE 4.88* 4.61*  CALCIUM 9.6 9.4   No results found for this basename: TROPONINI, CK, MB,  in the last 72 hours  Lab Results  Component Value Date   CHOL 177 01/08/2012   HDL 53 10/18/2010   LDLCALC  Value: 35        Total Cholesterol/HDL:CHD Risk Coronary Heart Disease Risk Table                     Men   Women  1/2 Average Risk   3.4   3.3  Average Risk       5.0   4.4  2 X Average Risk   9.6   7.1  3 X Average Risk  23.4   11.0        Use the calculated Patient Ratio above and the CHD Risk Table to determine  the patient's CHD Risk.        ATP III CLASSIFICATION (LDL):  <100     mg/dL   Optimal  100-129  mg/dL   Near or Above                    Optimal  130-159  mg/dL   Borderline  160-189  mg/dL   High  >190     mg/dL   Very High 10/18/2010   TRIG 77 06/08/2014   CHOLHDL 1.8 10/18/2010      Lab Results  Component Value Date   TSH 1.02 04/16/2012       Studies/Results: Dg Swallowing Func-speech Pathology  06/14/2014   Katherene Ponto [place], CCC-SLP     06/14/2014 10:32 AM Objective Swallowing Evaluation: Modified Barium Swallowing Study   Patient Details  Name: Roy [name] MRN: [id number] Date of Birth: 1946/03/23  Today's Date: 06/14/2014 Time: 0950-1015 SLP Time Calculation (min): 25 min  Past Medical History:  Past Medical History  Diagnosis Date  . Cerebrovascular disease 2009    TIA; 2009- right ICA stent; re-intervention  for restenosis  complicated by [facility] w/o sx  . Degenerative joint disease     Total shoulder arthroplasty-right  . LBBB (left bundle branch block)     Normal echo-2011; stress nuclear in 09/2010--septal  hypoperfusion representing nontransmural infarction or the effect  of left bundle branch block, no ischemia  . Hyperlipidemia   . Hypertension   . Chronic obstructive pulmonary disease   . Tobacco abuse     -100 pack years; 1.5 packs per day  . Anxiety and depression   . Alcohol abuse     6 beers per day; hospital admission in 2009 for withdrawal  symptoms  . Thrombocytopenia   . Tubular adenoma of colon    Past Surgical History:  Past Surgical History  Procedure Laterality Date  . Lumbar fusion  2010  . Vasectomy  1971  . Colonoscopy w/ polypectomy  2011  . Total shoulder arthroplasty  2011    Right-Dr. Tamera Punt  . Colonoscopy  08/22/09    Fields-(Tubular Adenoma)3-mm transverse polyp/4-mm polyp  otherwise noraml/small internal hemorrhoids  . Esophagogastroduodenoscopy [name] 03/05/2014    Procedure: ESOPHAGOGASTRODUODENOSCOPY (EGD);  Surgeon: Danie Binder, MD;  [facility]: [facility];   Service: Endoscopy;   Laterality: [name];  11:45  . Agile capsule [name] 03/18/2014    Procedure: AGILE CAPSULE;  Surgeon: Danie Binder, MD;   Location: [facility];  Service: Endoscopy;  Laterality: [name];   7:30  . Givens capsule study [name] 03/30/2014    Procedure: GIVENS CAPSULE STUDY;  Surgeon: Danie Binder, MD;   Location: [facility];  Service: Endoscopy;  Laterality: [name];   7:30  . Colonoscopy [name] 04/14/2014    Procedure: COLONOSCOPY;  Surgeon: Danie Binder, MD;   Location: [facility];  Service: Endoscopy;  Laterality: [name];   9:66   HPI:  68 year old male, current smoker with PMH of COPD, alcohol abuse,  chronic bronchitis, TIA, HTN, LBBB who presented to [facility] ED via  EMS 7/9 for progressively worsening SOB and respiratory distress.  Transferred to [facility].  Intubated 7/10-7/17. NSTEMI     Assessment / Plan / Recommendation Clinical Impression  Dysphagia Diagnosis: Moderate oral phase dysphagia;Mild  pharyngeal phase dysphagia Clinical impression: Pt presents with a moderate oral dysphagia  impacting safety with PO. Pt is moderately confused, edentulous  and tremulous with very dry oral mucosa. With most boluses, pt  will briefly hold bolus orally with occasional lingual tremors  prior to active transit. Intermittently pt has premature spillage  of partial bolus to pyriforms and sometimes pt actively transits  bolus with a delayed swallow initiation and aspiration before the  swallow. Of note, pt often clears throat post swallow, unrelated  to penetration or aspiration. Initially aspiration event was  silent, but then followed by late, hard, effective cough that  expelled thin aspirate. In some trials swallow was timely,  suggestive of a primary cognitive problem rather than a true  sensory deficit. Strength is WNL and trace residuals coating all  oropahryngeal structures likely due to dry mucosa. Recommend  nectar thick liquids as pt orally manages this bolus best and has  decreased risk of aspiration before the  swallow. Will also start  pt with a puree diet, though expect as mentation improves he may  tolerate soft solids and thin liquids well.     Treatment Recommendation  Therapy as outlined in treatment plan below    Diet Recommendation Dysphagia 1 (Puree);Nectar-thick liquid   Liquid Administration via: Cup;Straw Medication  Administration: Crushed with puree Supervision: Staff to assist with self feeding Compensations: Slow rate;Small sips/bites Postural Changes and/or Swallow Maneuvers: Seated upright 90  degrees;Upright 30-60 min after meal    Other  Recommendations Oral Care Recommendations: Oral care BID Other Recommendations: Order thickener from pharmacy   Follow Up Recommendations  Skilled Nursing facility    Frequency and Duration min 2x/week  2 weeks   Pertinent Vitals/Pain NA    SLP Swallow Goals     General Date of Onset: 06/03/14 HPI: 68 year old male, current smoker with PMH of COPD, alcohol  abuse, chronic bronchitis, TIA, HTN, LBBB who presented to [facility] ED  via EMS 7/9 for progressively worsening SOB and respiratory  distress. Transferred to [facility].  Intubated 7/10-7/17. NSTEMI Type of Study: Modified Barium Swallowing Study Reason for Referral: Objectively evaluate swallowing function Previous Swallow Assessment: none per records Diet Prior to this Study: NPO Temperature Spikes Noted: No Respiratory Status: Room air History of Recent Intubation: Yes Length of Intubations (days): 7 days Date extubated: 06/11/14 Behavior/Cognition: Alert;Cooperative;Decreased sustained  attention;Requires cueing;Distractible Oral Cavity - Dentition: Edentulous Oral Motor / Sensory Function: Within functional limits Self-Feeding Abilities: Needs assist Patient Positioning: Upright in chair Baseline Vocal Quality: Breathy;Hoarse;Low vocal intensity Volitional Cough: Strong Volitional Swallow: Able to elicit Anatomy:  (dry oral mucosa) Pharyngeal Secretions: Not observed secondary MBS    Reason for Referral Objectively evaluate  swallowing function   Oral Phase Oral Preparation/Oral Phase Oral Phase: Impaired Oral - Nectar Oral - Nectar Cup: Lingual/palatal residue;Delayed oral  transit;Reduced posterior propulsion;Holding of bolus;Lingual  pumping Oral - Nectar Straw: Lingual/palatal residue;Delayed oral  transit;Reduced posterior propulsion;Holding of bolus;Lingual  pumping Oral - Thin Oral - Thin Cup: Lingual/palatal residue;Delayed oral  transit;Reduced posterior propulsion;Holding of bolus;Lingual  pumping Oral - Thin Straw: Lingual/palatal residue;Delayed oral  transit;Reduced posterior propulsion;Holding of bolus;Lingual  pumping Oral - Solids Oral - Puree: Lingual/palatal residue;Delayed oral  transit;Reduced posterior propulsion;Holding of bolus;Lingual  pumping Oral - Mechanical Soft: Lingual/palatal residue;Delayed oral  transit;Reduced posterior propulsion;Holding of bolus;Lingual  pumping;Piecemeal swallowing Oral - Pill: Lingual/palatal residue;Delayed oral transit;Reduced  posterior propulsion;Holding of bolus;Lingual pumping   Pharyngeal Phase Pharyngeal Phase Pharyngeal Phase: Impaired Pharyngeal - Nectar Pharyngeal - Nectar Cup: Delayed swallow initiation;Premature  spillage to valleculae Pharyngeal - Nectar Straw: Delayed swallow initiation;Premature  spillage to valleculae Pharyngeal - Thin Pharyngeal - Thin Cup: Delayed swallow initiation;Premature  spillage to pyriform sinuses Pharyngeal - Thin Straw: Delayed swallow initiation;Premature  spillage to pyriform sinuses;Penetration/Aspiration before  swallow;Moderate aspiration Penetration/Aspiration details (thin straw): Material enters  airway, passes BELOW cords without attempt by patient to eject  out (silent aspiration) Pharyngeal - Solids Pharyngeal - Puree: Delayed swallow initiation Pharyngeal - Mechanical Soft: Delayed swallow  initiation;Premature spillage to pyriform sinuses  Cervical Esophageal Phase    GO    Cervical Esophageal Phase Cervical Esophageal  Phase: [facility]        Herbie [place], MA CCC-SLP [phone]  [place], Katherene Ponto 06/14/2014, 10:27 AM    ECHO: Left ventricle: The cavity size was mildly dilated. Wall thickness was increased in a pattern of mild LVH. The estimated ejection fraction was 20%. Diffuse hypokinesis. Regional wall motion abnormalities cannot be excluded. Doppler parameters are consistent with restrictive physiology, indicative of decreased left ventricular diastolic compliance and/or increased left atrial pressure. - Left atrium: The atrium was mildly dilated.    Medications: I have reviewed the patient's current medications. Scheduled Meds: . antiseptic oral rinse  15 mL Mouth Rinse QID  . aspirin  81 mg  Oral Daily  . aspirin  81 mg Oral Pre-Cath  . atorvastatin  80 mg Oral q1800  . chlorhexidine  15 mL Mouth Rinse BID  . clopidogrel  75 mg Oral Daily  . folic acid  1 mg Oral Daily  . heparin subcutaneous  5,000 Units Subcutaneous 3 times per day  . insulin aspart  0-15 Units Subcutaneous TID WC  . insulin aspart  0-5 Units Subcutaneous QHS  . ipratropium-albuterol  3 mL Nebulization BID  . metoprolol tartrate  25 mg Oral TID  . risperiDONE  0.5 mg Oral q morning - 10a  . risperiDONE  1 mg Oral QHS  . sodium chloride  3 mL Intravenous Q12H  . thiamine  100 mg Oral Daily   Continuous Infusions: . sodium chloride 10 mL/hr at 06/14/14 0518  . dextrose     PRN Meds:.sodium chloride, food thickener, ipratropium-albuterol, metoprolol, RESOURCE THICKENUP CLEAR, sodium chloride  Assessment/Plan: Active Problems:   Respiratory failure   Acute respiratory failure-COPD   Pneumonia, organism unspecified   Elevated troponin   NSTEMI (non-ST elevated myocardial infarction) type 2 due to demand ischemia VS. Primary obstructive CAD. Patient is not a candidate for further ischemic work up due to mental status changes and ARF. On metoprolol and plavix.     Acute on chronic diastolic heart failure   Hx of ETOH  abuse    CV disease, s/p intracranial stent on Plavix    PAD    Acute Renal failure   LOS: 12 days   Time spent with pt. :15 minutes. [facility] R  Nurse Practitioner Certified Pager [phone] or after 5pm and on weekends call [phone] 06/15/2014, 9:25 AM   Patient seen and examined and history reviewed. Agree with above findings and plan. Mental status still poor. Awake but appears to hallucinate. Volume status is OK. Correction of hypernatremia per renal. Renal function stabilized today. PVCs on monitor. Not a candidate for cardiac cath given renal failure. On metoprolol only. Not a candidate for ACEi/ARB due to ARF. With relatively low BP would avoid nitrates/ hydralazine for now.   Peter Martinique, [place] 06/15/2014 11:02 AM

## 2014-06-15 NOTE — Progress Notes (Signed)
Chart reviewed.   [facility] PROGRESS NOTE  Roy [name] IRS:[id number] DOB: 1946-07-18 DOA: 06/03/2014 PCP: [name], MD BRIEF PATIENT DESCRIPTION: 68 y.o. active smoker (120 pack years) with COPD admitted 7/9 with pneumonia and hypoxemic respiratory failure. Initially with elevated troponins (peaked at 4.84) but EKG neg acute MI, CXR with pulm edema and ultimately Found to have severe systolic dysfunction with an EF of 20% with global hypokinesis. He was intubated and treated for AECOPD +/- PNA with IV steroids, empiric IV abx for CAP, Nebulized BDs, diuresis and cardiology was consulted. He was successfully extubated 7/17 and he was continued on BD's and aggressive pulm hygiene. He improved with abx and diuresis and and cardiology followed closely for NSTEMI for medical management with asa, plavix, B blocker, statin. Initial cardiology plan was for cardiac cath but course was c/b ETOH withdrawal and metabolic encephalopathy and cath was deferred in setting confusion. At this time he is overall much improved, remains moderately confused but improving. Passed swallow eval with only mild aspiration and is ok for PO diet per speech therapy. He needs continued rehab efforts and plan is for d/c to [facility] when medically ready.  SIGNIFICANT EVENTS / STUDIES:  7/9 Transferred to [facility]  7/10 TTE >>> LVEF 20%, physiology suggestive of restriction, LAE  7/12 Intubated  7/17 Extubated  7/21 MBS - mild aspiration, ok for nectar thick liquids, pureed diet  LINES / TUBES:  ETT 7/10 >>> 7/17  R IJ TLC 7/10 >>> out   ANTIBIOTICS:  Azithromycin 7/9 >>> 7/13  Ceftriaxone 7/9 >>>7/19   Assessment/Plan:    Acute respiratory failure: secondary to acute CHF, CAP, copd exacerbation. Improved    Encephalopathy, Likely multifactorial: Hypernatremia, uremia, continued alcohol withdrawal. Will schedule Ativan. Currently getting hypotonic IV fluids. Will also check ammonia level, TSH, B12, folate. Continue  thiamine and folate.    Alcohol withdrawal delirium: Required benzodiazepines and Precedex drip in the ICU.    Tobacco abuse, heavy: Wife requesting nicotine patch. Will order.    Pneumonia, organism unspecified, treated    NSTEMI (non-ST elevated myocardial infarction): Medical management only, as patient is unable to get cardiac catheterization do to acute renal failure and continued confusion.    Acute systolic heart failure: Ejection fraction 20%, presumably ischemic. Currently volume depleted and prerenal.    COPD exacerbation, improved.    Hypernatremia:  On D5W.    Acute renal failure: Per nephrology    Loose stools, C. difficile negative.   Code Status:  full Family Communication:   Disposition Plan:  LTAC  Consultants:  Critical care medicine  [facility]  Nephrology  HPI/Subjective: Unable. Per nursing staff, has been tremulous and confused all day.  Objective: Filed Vitals:   06/15/14 1435  BP: 117/75  Pulse: 74  Temp: 98.1 F (36.7 C)  Resp: 20    Intake/Output Summary (Last 24 hours) at 06/15/14 1711 Last data filed at 06/15/14 1300  Gross per 24 hour  Intake     60 ml  Output      0 ml  Net     60 ml   Filed Weights   06/14/14 0639 06/15/14 0500 06/15/14 0714  Weight: 58 kg (127 lb 13.9 oz) 60.2 kg (132 lb 11.5 oz) 60.2 kg (132 lb 11.5 oz)    Exam:   General:  Awake. Words are unintelligible. Does not follow commands. Tremulous.  HEENT: Extremely dry mucous membranes.  Cardiovascular: Regular rate rhythm without murmurs gallops rubs   Respiratory:  Clear to auscultation bilaterally without wheezes rhonchi or rales  Abdomen: Soft nontender nondistended  Ext: No clubbing cyanosis or edema  Basic Metabolic Panel:  Recent Labs Lab 06/09/14 0255  06/10/14 0246  06/11/14 0306  06/12/14 0311  06/14/14 0350 06/14/14 1814 06/14/14 2242 06/15/14 0546 06/15/14 0845 06/15/14 1215  NA 140  < > 147  < > 144  < > 147  < > 156*  158* 155* 162* 162* 161*  K 3.8  < > 3.3*  < > 4.1  < > 3.6*  < > 3.8 4.1 3.6* 3.7 3.8 3.6*  CL 90*  < > 92*  < > 93*  < > 94*  < > 107 115* 113* 116* 117* 119*  CO2 29  < > 31  < > 29  < > 31  < > 22 20 21 20 19  17*  GLUCOSE 168*  < > 185*  < > 175*  < > 146*  < > 152* 130* 131* 123* 126* 156*  BUN 32*  < > 66*  < > 100*  < > 110*  < > 177* 184* 184* 178* 171* 172*  CREATININE 0.67  < > 1.02  < > 1.18  < > 1.14  < > 4.60* 4.96* 4.88* 4.61* 4.38* 4.23*  CALCIUM 9.9  < > 10.6*  < > 10.6*  < > 11.0*  < > 9.9 9.9 9.6 9.4 9.4 9.5  MG 1.8  --  2.6*  --  3.0*  --  3.3*  --  3.6*  --   --   --   --   --   PHOS 7.9*  --  7.1*  --  6.4*  --  5.5*  --   --   --   --   --   --   --   < > = values in this interval not displayed. Liver Function Tests: No results found for this basename: AST, ALT, ALKPHOS, BILITOT, PROT, ALBUMIN,  in the last 168 hours No results found for this basename: LIPASE, AMYLASE,  in the last 168 hours No results found for this basename: AMMONIA,  in the last 168 hours CBC:  Recent Labs Lab 06/09/14 0255 06/10/14 0246 06/11/14 0306 06/12/14 0311 06/13/14 0220 06/14/14 0350 06/15/14 0546  WBC 13.3* 18.8* 18.2* 13.7* 16.5* 15.5* 13.4*  NEUTROABS 10.3* 15.2*  --   --   --   --   --   HGB 11.4* 12.8* 12.7* 13.5 12.7* 11.9* 10.8*  HCT 34.1* 39.6 38.7* 42.0 39.1 38.1* 34.4*  MCV 85.7 88.8 88.0 90.7 89.7 90.3 89.4  PLT 436* 536* 642* 699* 791* 581* 500*   Cardiac Enzymes: No results found for this basename: CKTOTAL, CKMB, CKMBINDEX, TROPONINI,  in the last 168 hours BNP (last 3 results)  Recent Labs  06/03/14 1011 06/07/14 0239  PROBNP 5223.0* 4105.0*   CBG:  Recent Labs Lab 06/14/14 1654 06/14/14 2209 06/15/14 0658 06/15/14 1202 06/15/14 1602  GLUCAP 152* 114* 114* 145* 138*    Recent Results (from the past 240 hour(s))  CLOSTRIDIUM DIFFICILE BY PCR     Status: None   Collection Time    06/15/14  7:24 AM      Result Value Ref Range Status   C difficile  by pcr NEGATIVE  NEGATIVE Final     Studies: Dg Swallowing Func-speech Pathology  06/14/2014   Katherene Ponto [place], CCC-SLP     06/14/2014 10:32 AM Objective Swallowing Evaluation: Modified Barium Swallowing  Study   Patient Details  Name: Roy [name] MRN: [id number] Date of Birth: 04/25/1946  Today's Date: 06/14/2014 Time: 0950-1015 SLP Time Calculation (min): 25 min  Past Medical History:  Past Medical History  Diagnosis Date  . Cerebrovascular disease 2009    TIA; 2009- right ICA stent; re-intervention for restenosis  complicated by [facility] w/o sx  . Degenerative joint disease     Total shoulder arthroplasty-right  . LBBB (left bundle branch block)     Normal echo-2011; stress nuclear in 09/2010--septal  hypoperfusion representing nontransmural infarction or the effect  of left bundle branch block, no ischemia  . Hyperlipidemia   . Hypertension   . Chronic obstructive pulmonary disease   . Tobacco abuse     -100 pack years; 1.5 packs per day  . Anxiety and depression   . Alcohol abuse     6 beers per day; hospital admission in 2009 for withdrawal  symptoms  . Thrombocytopenia   . Tubular adenoma of colon    Past Surgical History:  Past Surgical History  Procedure Laterality Date  . Lumbar fusion  2010  . Vasectomy  1971  . Colonoscopy w/ polypectomy  2011  . Total shoulder arthroplasty  2011    Right-Dr. Tamera Punt  . Colonoscopy  08/22/09    Fields-(Tubular Adenoma)3-mm transverse polyp/4-mm polyp  otherwise noraml/small internal hemorrhoids  . Esophagogastroduodenoscopy [name] 03/05/2014    Procedure: ESOPHAGOGASTRODUODENOSCOPY (EGD);  Surgeon: Danie Binder, MD;  Location: [facility];  Service: Endoscopy;   Laterality: [name];  11:45  . Agile capsule [name] 03/18/2014    Procedure: AGILE CAPSULE;  Surgeon: Danie Binder, MD;   Location: [facility] ENDO [facility];  Service: Endoscopy;  Laterality: [name];   7:30  . Givens capsule study [name] 03/30/2014    Procedure: GIVENS CAPSULE STUDY;  Surgeon: Danie Binder, MD;   Location: [facility]  ENDO SUITE;  Service: Endoscopy;  Laterality: [name];   7:30  . Colonoscopy [name] 04/14/2014    Procedure: COLONOSCOPY;  Surgeon: Danie Binder, MD;   Location: [facility];  Service: Endoscopy;  Laterality: [name];   9:58   HPI:  68 year old male, current smoker with PMH of COPD, alcohol abuse,  chronic bronchitis, TIA, HTN, LBBB who presented to [facility] ED via  EMS 7/9 for progressively worsening SOB and respiratory distress.  Transferred to [facility].  Intubated 7/10-7/17. NSTEMI     Assessment / Plan / Recommendation Clinical Impression  Dysphagia Diagnosis: Moderate oral phase dysphagia;Mild  pharyngeal phase dysphagia Clinical impression: Pt presents with a moderate oral dysphagia  impacting safety with PO. Pt is moderately confused, edentulous  and tremulous with very dry oral mucosa. With most boluses, pt  will briefly hold bolus orally with occasional lingual tremors  prior to active transit. Intermittently pt has premature spillage  of partial bolus to pyriforms and sometimes pt actively transits  bolus with a delayed swallow initiation and aspiration before the  swallow. Of note, pt often clears throat post swallow, unrelated  to penetration or aspiration. Initially aspiration event was  silent, but then followed by late, hard, effective cough that  expelled thin aspirate. In some trials swallow was timely,  suggestive of a primary cognitive problem rather than a true  sensory deficit. Strength is WNL and trace residuals coating all  oropahryngeal structures likely due to dry mucosa. Recommend  nectar thick liquids as pt orally manages this bolus best and has  decreased risk of aspiration before the swallow. Will also  start  pt with a puree diet, though expect as mentation improves he may  tolerate soft solids and thin liquids well.     Treatment Recommendation  Therapy as outlined in treatment plan below    Diet Recommendation Dysphagia 1 (Puree);Nectar-thick liquid   Liquid Administration via: Cup;Straw Medication  Administration: Crushed with puree Supervision: Staff to assist with self feeding Compensations: Slow rate;Small sips/bites Postural Changes and/or Swallow Maneuvers: Seated upright 90  degrees;Upright 30-60 min after meal    Other  Recommendations Oral Care Recommendations: Oral care BID Other Recommendations: Order thickener from pharmacy   Follow Up Recommendations  Skilled Nursing facility    Frequency and Duration min 2x/week  2 weeks   Pertinent Vitals/Pain NA    SLP Swallow Goals     General Date of Onset: 06/03/14 HPI: 68 year old male, current smoker with PMH of COPD, alcohol  abuse, chronic bronchitis, TIA, HTN, LBBB who presented to [facility] ED  via EMS 7/9 for progressively worsening SOB and respiratory  distress. Transferred to [facility].  Intubated 7/10-7/17. NSTEMI Type of Study: Modified Barium Swallowing Study Reason for Referral: Objectively evaluate swallowing function Previous Swallow Assessment: none per records Diet Prior to this Study: NPO Temperature Spikes Noted: No Respiratory Status: Room air History of Recent Intubation: Yes Length of Intubations (days): 7 days Date extubated: 06/11/14 Behavior/Cognition: Alert;Cooperative;Decreased sustained  attention;Requires cueing;Distractible Oral Cavity - Dentition: Edentulous Oral Motor / Sensory Function: Within functional limits Self-Feeding Abilities: Needs assist Patient Positioning: Upright in chair Baseline Vocal Quality: Breathy;Hoarse;Low vocal intensity Volitional Cough: Strong Volitional Swallow: Able to elicit Anatomy:  (dry oral mucosa) Pharyngeal Secretions: Not observed secondary MBS    Reason for Referral Objectively evaluate swallowing function   Oral Phase Oral Preparation/Oral Phase Oral Phase: Impaired Oral - Nectar Oral - Nectar Cup: Lingual/palatal residue;Delayed oral  transit;Reduced posterior propulsion;Holding of bolus;Lingual  pumping Oral - Nectar Straw: Lingual/palatal residue;Delayed oral  transit;Reduced posterior  propulsion;Holding of bolus;Lingual  pumping Oral - Thin Oral - Thin Cup: Lingual/palatal residue;Delayed oral  transit;Reduced posterior propulsion;Holding of bolus;Lingual  pumping Oral - Thin Straw: Lingual/palatal residue;Delayed oral  transit;Reduced posterior propulsion;Holding of bolus;Lingual  pumping Oral - Solids Oral - Puree: Lingual/palatal residue;Delayed oral  transit;Reduced posterior propulsion;Holding of bolus;Lingual  pumping Oral - Mechanical Soft: Lingual/palatal residue;Delayed oral  transit;Reduced posterior propulsion;Holding of bolus;Lingual  pumping;Piecemeal swallowing Oral - Pill: Lingual/palatal residue;Delayed oral transit;Reduced  posterior propulsion;Holding of bolus;Lingual pumping   Pharyngeal Phase Pharyngeal Phase Pharyngeal Phase: Impaired Pharyngeal - Nectar Pharyngeal - Nectar Cup: Delayed swallow initiation;Premature  spillage to valleculae Pharyngeal - Nectar Straw: Delayed swallow initiation;Premature  spillage to valleculae Pharyngeal - Thin Pharyngeal - Thin Cup: Delayed swallow initiation;Premature  spillage to pyriform sinuses Pharyngeal - Thin Straw: Delayed swallow initiation;Premature  spillage to pyriform sinuses;Penetration/Aspiration before  swallow;Moderate aspiration Penetration/Aspiration details (thin straw): Material enters  airway, passes BELOW cords without attempt by patient to eject  out (silent aspiration) Pharyngeal - Solids Pharyngeal - Puree: Delayed swallow initiation Pharyngeal - Mechanical Soft: Delayed swallow  initiation;Premature spillage to pyriform sinuses  Cervical Esophageal Phase    GO    Cervical Esophageal Phase Cervical Esophageal Phase: [facility]        Herbie Baltimore, MA CCC-SLP [phone]  DeBlois, Katherene Ponto 06/14/2014, 10:27 AM     Scheduled Meds: . antiseptic oral rinse  15 mL Mouth Rinse QID  . aspirin  81 mg Oral Daily  . aspirin  81 mg Oral Pre-Cath  . atorvastatin  80 mg Oral q1800  .  chlorhexidine  15 mL Mouth Rinse BID  .  clopidogrel  75 mg Oral Daily  . folic acid  1 mg Oral Daily  . heparin subcutaneous  5,000 Units Subcutaneous 3 times per day  . insulin aspart  0-15 Units Subcutaneous TID WC  . insulin aspart  0-5 Units Subcutaneous QHS  . ipratropium-albuterol  3 mL Nebulization BID  . metoprolol tartrate  25 mg Oral TID  . nicotine  21 mg Transdermal Daily  . risperiDONE  0.5 mg Oral q morning - 10a  . risperiDONE  1 mg Oral QHS  . sodium chloride  3 mL Intravenous Q12H  . thiamine  100 mg Oral Daily   Continuous Infusions: . sodium chloride 10 mL/hr at 06/14/14 0518  . dextrose 125 mL (06/15/14 1633)    Time spent: 55 minutes  [facility] Pager [phone]. If 7PM-7AM, please contact night-coverage at [email or web address], password [facility] 06/15/2014, 5:11 PM  LOS: 12 days

## 2014-06-15 NOTE — Progress Notes (Signed)
NUTRITION FOLLOW-UP  DOCUMENTATION CODES Per approved criteria  -Not Applicable   INTERVENTION: Diet advancement per MD Provide Magic Cup BID and Ensure Pudding BID when diet advances Provide Multivitamin with minerals daily RD to continue to follow nutrition care plan.  NUTRITION DIAGNOSIS: Inadequate oral intake related to inability to eat as evidenced by npo status. Ongoing.   Goal: Intake to meet >90% of estimated nutrition needs. Unmet.  Monitor:  weight trends, lab trends, I/O's, ventilator use, tolerance of nutrition support  ASSESSMENT: PMHx significant for COPD, ETOH abuse, chronic bronchitis, TIA, HTN, LBBB. Admitted with progressively worsening SOB and respiratory distress. Work-up reveals acute hypoxemic respiratory failure.  Per chart, pt smokes 2-3 packs of cigarettes daily and drinks a case of beer daily.  Intubated 7/12 after pt developed acute hypoxemic respiratory failure, severe agitation and hallucinations. Extubated on 7/17.   Diet was advanced on 7/20 to Dysphagia 1 with nectar-thick liquids. Per nursing notes pt ate 25% of one meal; pt was made NPO again today.  Pt's weight has trended down 21 lbs since admission with 2% weight loss in the past 5 days. Pt attemped to answer RD questions at time of visit but, his voice was raspy and speech was unintelligible.   CBG's: 114 - 162 Sodium elevated at 162 Potassium WNL High BUN of 171 High Creatinine Low GFR Magnesium elevated at 3.6   Height: Ht Readings from Last 1 Encounters:  06/04/14 5\' 6"  (1.676 m)    Weight: Wt Readings from Last 1 Encounters:  06/15/14 132 lb 11.5 oz (60.2 kg)  Admit wt 153 lb  BMI:  Body mass index is 21.43 kg/(m^2). WNL  Estimated Nutritional Needs: Kcal: 1700-1900 Protein: 85-95 grams Fluid: 1.7-1.9 L/day  Skin: intact  Diet Order: NPO    Intake/Output Summary (Last 24 hours) at 06/15/14 1245 Last data filed at 06/15/14 7824  Gross per 24 hour  Intake  2003.33 ml  Output      0 ml  Net 2003.33 ml    Last BM: 7/21  Labs:   Recent Labs Lab 06/10/14 0246  06/11/14 0306  06/12/14 0311  06/14/14 0350  06/14/14 2242 06/15/14 0546 06/15/14 0845  NA 147  < > 144  < > 147  < > 156*  < > 155* 162* 162*  K 3.3*  < > 4.1  < > 3.6*  < > 3.8  < > 3.6* 3.7 3.8  CL 92*  < > 93*  < > 94*  < > 107  < > 113* 116* 117*  CO2 31  < > 29  < > 31  < > 22  < > 21 20 19   BUN 66*  < > 100*  < > 110*  < > 177*  < > 184* 178* 171*  CREATININE 1.02  < > 1.18  < > 1.14  < > 4.60*  < > 4.88* 4.61* 4.38*  CALCIUM 10.6*  < > 10.6*  < > 11.0*  < > 9.9  < > 9.6 9.4 9.4  MG 2.6*  --  3.0*  --  3.3*  --  3.6*  --   --   --   --   PHOS 7.1*  --  6.4*  --  5.5*  --   --   --   --   --   --   GLUCOSE 185*  < > 175*  < > 146*  < > 152*  < > 131* 123* 126*  < > =  values in this interval not displayed.  CBG (last 3)   Recent Labs  06/14/14 2209 06/15/14 0658 06/15/14 1202  GLUCAP 114* 114* 145*    Scheduled Meds: . antiseptic oral rinse  15 mL Mouth Rinse QID  . aspirin  81 mg Oral Daily  . aspirin  81 mg Oral Pre-Cath  . atorvastatin  80 mg Oral q1800  . chlorhexidine  15 mL Mouth Rinse BID  . clopidogrel  75 mg Oral Daily  . folic acid  1 mg Oral Daily  . heparin subcutaneous  5,000 Units Subcutaneous 3 times per day  . insulin aspart  0-15 Units Subcutaneous TID WC  . insulin aspart  0-5 Units Subcutaneous QHS  . ipratropium-albuterol  3 mL Nebulization BID  . metoprolol tartrate  25 mg Oral TID  . risperiDONE  0.5 mg Oral q morning - 10a  . risperiDONE  1 mg Oral QHS  . sodium chloride  3 mL Intravenous Q12H  . thiamine  100 mg Oral Daily    Continuous Infusions: . sodium chloride 10 mL/hr at 06/14/14 0518  . dextrose 100 mL/hr at 06/15/14 [facility], LDN Inpatient Clinical Dietitian Pager: [phone] After Hours Pager: [phone]

## 2014-06-15 NOTE — Progress Notes (Signed)
Stool was sent down on previous shift to r/o c.diff. Test results are negative. Contact precautions discontinued.

## 2014-06-15 NOTE — Progress Notes (Signed)
This morning, patient had a large bowel movement that had the possible scent of Clostrium Difficile. Sent stool specimen to rule out C.Diff and placed patient on enteric precautions until results are final. Notified oncoming nurse. Nurse signing off at this time.

## 2014-06-15 NOTE — Progress Notes (Signed)
CRITICAL VALUE ALERT  Critical value received:  Serum Osmolality 397  Date of notification:  06/15/2014   Time of notification:  12:30pmy  Critical value read back:Yes.     Nurse who received alert:  Sherene Sires  MD notified (1st page): Dr. Otelia Santee Called at 12:40pm  Time of first page:  12:40pm  MD notified (2nd page):  Time of second page:  Responding MD:  Otelia Santee  Time MD responded:  12:54pm

## 2014-06-16 LAB — BASIC METABOLIC PANEL
Anion gap: 20 — ABNORMAL HIGH (ref 5–15)
Anion gap: 21 — ABNORMAL HIGH (ref 5–15)
Anion gap: 18 — ABNORMAL HIGH (ref 5–15)
BUN: 153 mg/dL — ABNORMAL HIGH (ref 6–23)
BUN: 130 mg/dL — ABNORMAL HIGH (ref 6–23)
BUN: 156 mg/dL — AB (ref 6–23)
CALCIUM: 9.6 mg/dL (ref 8.4–10.5)
CO2: 20 mEq/L (ref 19–32)
CO2: 20 mEq/L (ref 19–32)
CO2: 21 mEq/L (ref 19–32)
Calcium: 9.5 mg/dL (ref 8.4–10.5)
Calcium: 9.5 mg/dL (ref 8.4–10.5)
Chloride: 120 mEq/L — ABNORMAL HIGH (ref 96–112)
Chloride: 120 mEq/L — ABNORMAL HIGH (ref 96–112)
Chloride: 116 mEq/L — ABNORMAL HIGH (ref 96–112)
Creatinine, Ser: 3.7 mg/dL — ABNORMAL HIGH (ref 0.50–1.35)
Creatinine, Ser: 3.76 mg/dL — ABNORMAL HIGH (ref 0.50–1.35)
Creatinine, Ser: 3.01 mg/dL — ABNORMAL HIGH (ref 0.50–1.35)
GFR calc Af Amer: 18 mL/min — ABNORMAL LOW (ref >90)
GFR calc non Af Amer: 20 mL/min — ABNORMAL LOW (ref >90)
GFR, EST AFRICAN AMERICAN: 23 mL/min — AB (ref >90)
GFR, EST AFRICAN AMERICAN: 18 mL/min — AB (ref >90)
GFR, EST NON AFRICAN AMERICAN: 16 mL/min — AB (ref >90)
GFR, EST NON AFRICAN AMERICAN: 15 mL/min — AB (ref >90)
GLUCOSE: 150 mg/dL — AB (ref 70–99)
GLUCOSE: 173 mg/dL — AB (ref 70–99)
Glucose, Bld: 170 mg/dL — ABNORMAL HIGH (ref 70–99)
POTASSIUM: 3.5 meq/L — AB (ref 3.7–5.3)
POTASSIUM: 3.6 meq/L — AB (ref 3.7–5.3)
Potassium: 3.5 mEq/L — ABNORMAL LOW (ref 3.7–5.3)
SODIUM: 155 meq/L — AB (ref 137–147)
Sodium: 160 mEq/L — ABNORMAL HIGH (ref 137–147)
Sodium: 161 mEq/L — ABNORMAL HIGH (ref 137–147)

## 2014-06-16 LAB — NA AND K (SODIUM & POTASSIUM), RAND UR
POTASSIUM UR: 19 meq/L
SODIUM UR: 52 meq/L

## 2014-06-16 LAB — GLUCOSE, CAPILLARY
GLUCOSE-CAPILLARY: 143 mg/dL — AB (ref 70–99)
Glucose-Capillary: 148 mg/dL — ABNORMAL HIGH (ref 70–99)
Glucose-Capillary: 163 mg/dL — ABNORMAL HIGH (ref 70–99)
Glucose-Capillary: 164 mg/dL — ABNORMAL HIGH (ref 70–99)
Glucose-Capillary: 138 mg/dL — ABNORMAL HIGH (ref 70–99)

## 2014-06-16 LAB — VITAMIN B12: Vitamin B-12: >2000 pg/mL — ABNORMAL HIGH (ref 211–911)

## 2014-06-16 LAB — FOLATE RBC: RBC Folate: 1054 ng/mL — ABNORMAL HIGH (ref >280)

## 2014-06-16 LAB — AMMONIA: AMMONIA: 28 umol/L (ref 11–60)

## 2014-06-16 LAB — TSH: TSH: 1.65 u[IU]/mL (ref 0.350–4.500)

## 2014-06-16 NOTE — Progress Notes (Addendum)
Clinical Social Work Department CLINICAL SOCIAL WORK PLACEMENT NOTE 06/16/2014  Patient:  Waldvogel,Nahshon L  Account Number:  [id number] Admit date:  06/03/2014  Clinical Social Worker:  Hunt Oris, Latanya Presser  Date/time:  06/16/2014 10:27 PM  Clinical Social Work is seeking post-discharge placement for this patient at the following level of care:   [place]   (*CSW will update this form in Epic as items are completed)   06/16/2014  Patient/family provided with [facility] Department of Clinical Social Work's list of facilities offering this level of care within the geographic area requested by the patient (or if unable, by the patient's family).  06/16/2014  Patient/family informed of their freedom to choose among providers that offer the needed level of care, that participate in Medicare, Medicaid or managed care program needed by the patient, have an available bed and are willing to accept the patient.  06/16/2014  Patient/family informed of [facility]' ownership interest in [facility], as well as of the fact that they are under no obligation to receive care at this facility.  PASARR submitted to EDS on 06/16/2014 PASARR number received on 06/16/14  FL2 transmitted to all facilities in geographic area requested by pt/family on  06/16/2014 FL2 transmitted to all facilities within larger geographic area on   Patient informed that his/her managed care company has contracts with or will negotiate with  certain facilities, including the following:     Patient/family informed of bed offers received:  06/16/2014 Patient chooses bed at [facility] Physician recommends and patient chooses bed at    Patient to be transferred to  [facility] on  7/28 Patient to be transferred to facility by [facility] Patient and family notified of transfer on 7/28 Name of family member notified:  [facility] AWAITING FOR PT'S ARRIVAL.  The following physician request  were entered in Epic:   Additional Comments:  Hunt Oris, MSW, [place]

## 2014-06-16 NOTE — Progress Notes (Signed)
Patient ID: Roy [name]  male  FMB:[id number]    DOB: 1946/05/17    DOA: 06/03/2014  PCP: [name], MD  BRIEF PATIENT DESCRIPTION: 68 y.o. active smoker (120 pack years) with COPD admitted 7/9 with pneumonia and hypoxemic respiratory failure. Initially with elevated troponins (peaked at 4.84) but EKG neg acute MI, CXR with pulm edema and ultimately Found to have severe systolic dysfunction with an EF of 20% with global hypokinesis. He was intubated and treated for AECOPD +/- PNA with IV steroids, empiric IV abx for CAP, Nebulized BDs, diuresis and cardiology was consulted. He was successfully extubated 7/17 and he was continued on BD's and aggressive pulm hygiene. He improved with abx and diuresis and and cardiology followed closely for NSTEMI for medical management with asa, plavix, B blocker, statin. Initial cardiology plan was for cardiac cath but course was c/b ETOH withdrawal and metabolic encephalopathy and cath was deferred in setting confusion. At this time he is overall much improved, remains moderately confused but improving. Passed swallow eval with only mild aspiration and is ok for PO diet per speech therapy. He needs continued rehab efforts and plan is for d/c to [facility] when medically ready.     Assessment/Plan: Principal Problem:   Acute respiratory failure - Currently improving, multifactorial likely due to acute CHF, community acquired pneumonia, COPD exacerbation - O2 sats 99% on room air  Active Problems: Acute encephalopathy: Somewhat lethargic - Possibly multifactorial due to metabolic encephalopathy from hypernatremia, uremia, alcohol withdrawal - Continue correcting metabolic abnormalities, hypernatremia, acute kidney injury    Tobacco abuse - Continue nicotine patch    Alcohol withdrawal delirium - Currently stable, required Precedex drip and BZD's in the ICU    Pneumonia, organism unspecified - Has completed  course of Zithromax and Rocephin    NSTEMI (non-ST  elevated myocardial infarction) with acute systolic CHF - EF 70%, possibly ischemic, cardiology following, recommended medical management at this time, not a candidate for cardiac cath given his renal failure - Not a candidate for ACE/ARB due to acute renal insufficiency    COPD exacerbation - Stable, now improving     Hypernatremia With Acute renal : Sodium worsening, 161, creatinine 3.76  - Nephrology following, will defer to renal for management  Diarrhea: C. difficile negative, improving  DVT Prophylaxis:  Code Status:  Family Communication:  Disposition:Not medically ready   Consultants:  Cardiology   Nephrology   Procedures:  None   Antibiotics:      Subjective: Patient seen and examined, somewhat lethargic, able to respond to some questions, denies any acute complaints, denies any chest pain   Objective: Weight change: 0 kg (0 lb)  Intake/Output Summary (Last 24 hours) at 06/16/14 1112 Last data filed at 06/16/14 0900  Gross per 24 hour  Intake    120 ml  Output      0 ml  Net    120 ml   Blood pressure 121/56, pulse 95, temperature 98 F (36.7 C), temperature source Axillary, resp. rate 22, height 5\' 6"  (1.676 m), weight 60.3 kg (132 lb 15 oz), SpO2 99.00%.  Physical Exam: General: Very frail, tremulous, awake, doesn't follow commands  CVS: S1-S2 clear, no murmur rubs or gallops Chest: clear to auscultation bilaterally, no wheezing, rales or rhonchi Abdomen: soft nontender, nondistended, normal bowel sounds  Extremities: no cyanosis, clubbing or edema noted bilaterally   Lab Results: Basic Metabolic Panel:  Recent Labs Lab 06/12/14 0311  06/14/14 0350  06/16/14 0042 06/16/14 0043  NA 147  < > 156*  < > 160* 161*  K 3.6*  < > 3.8  < > 3.5* 3.6*  CL 94*  < > 107  < > 120* 120*  CO2 31  < > 22  < > 20 20  GLUCOSE 146*  < > 152*  < > 170* 173*  BUN 110*  < > 177*  < > 153* 156*  CREATININE 1.14  < > 4.60*  < > 3.70* 3.76*  CALCIUM 11.0*   < > 9.9  < > 9.6 9.5  MG 3.3*  --  3.6*  --   --   --   PHOS 5.5*  --   --   --   --   --   < > = values in this interval not displayed. Liver Function Tests: No results found for this basename: AST, ALT, ALKPHOS, BILITOT, PROT, ALBUMIN,  in the last 168 hours No results found for this basename: LIPASE, AMYLASE,  in the last 168 hours  Recent Labs Lab 06/16/14 0041  AMMONIA 28   CBC:  Recent Labs Lab 06/10/14 0246  06/14/14 0350 06/15/14 0546  WBC 18.8*  < > 15.5* 13.4*  NEUTROABS 15.2*  --   --   --   HGB 12.8*  < > 11.9* 10.8*  HCT 39.6  < > 38.1* 34.4*  MCV 88.8  < > 90.3 89.4  PLT 536*  < > 581* 500*  < > = values in this interval not displayed. Cardiac Enzymes: No results found for this basename: CKTOTAL, CKMB, CKMBINDEX, TROPONINI,  in the last 168 hours BNP: No components found with this basename: POCBNP,  CBG:  Recent Labs Lab 06/15/14 0658 06/15/14 1202 06/15/14 1602 06/15/14 2128 06/16/14 0651  GLUCAP 114* 145* 138* 148* 163*     Micro Results: Recent Results (from the past 240 hour(s))  STOOL CULTURE     Status: None   Collection Time    06/15/14  7:24 AM      Result Value Ref Range Status   Specimen Description STOOL   Final   Special Requests Normal   Final   Culture     Final   Value: Culture reincubated for better growth     Performed at [facility]   Report Status PENDING   Incomplete  CLOSTRIDIUM DIFFICILE BY PCR     Status: None   Collection Time    06/15/14  7:24 AM      Result Value Ref Range Status   C difficile by pcr NEGATIVE  NEGATIVE Final    Studies/Results: Dg Chest Port 1 View  06/11/2014   CLINICAL DATA:  Respiratory distress.  Intubated patient.  EXAM: PORTABLE CHEST - 1 VIEW  COMPARISON:  Single view of the chest 06/10/2014 and 06/09/2014.  FINDINGS: ET tube remains in place and projects in good position. NG tube is seen with the side port at the gastroesophageal junction. Recommend advancement of 4-5 cm. Lungs  are clear. Heart size is normal. No pneumothorax or pleural effusion. Right shoulder replacement noted.  IMPRESSION: Side port of the NG tube is at the gastroesophageal junction. Recommend advancement 4-5 cm.  ET tube in good position.  Lungs clear.   Electronically Signed   By: Inge Rise M.D.   On: 06/11/2014 08:00   Dg Chest Port 1 View  06/10/2014   CLINICAL DATA:  Assess endotracheal tube  EXAM: PORTABLE CHEST - 1 VIEW  COMPARISON:  06/09/2014  FINDINGS:  There is an endotracheal to which ends between the clavicles and carina. An orogastric tube enters the stomach.  Normal heart size and mediastinal contours for technique.  Improved basilar lung aeration, with the left diaphragm now visible. No effusion or pneumothorax.  IMPRESSION: 1. Well-positioned endotracheal and orogastric tubes. 2. Continued improvement in basilar lung aeration.   Electronically Signed   By: Jorje Guild M.D.   On: 06/10/2014 06:44   Dg Chest Port 1 View  06/09/2014   CLINICAL DATA:  Respiratory failure.  EXAM: PORTABLE CHEST - 1 VIEW  COMPARISON:  06/08/2014  FINDINGS: Endotracheal tube tip is approximately 4 cm above the carina. Lungs show improved aeration at the left base with mild residual atelectasis remaining. There is no evidence of pulmonary edema, consolidation, pneumothorax, nodule or pleural fluid. The heart size and mediastinal contours are within normal limits. Nasogastric tube continues to extend below the diaphragm.  IMPRESSION: Improved aeration at the left lung base.   Electronically Signed   By: Aletta Edouard M.D.   On: 06/09/2014 07:44   Dg Chest Port 1 View  06/08/2014   CLINICAL DATA:  COPD.  Respiratory failure.  EXAM: PORTABLE CHEST - 1 VIEW  COMPARISON:  Single view of the chest 06/07/2014 and 06/05/2014.  FINDINGS: Support tubes and lines are unchanged. No pneumothorax is identified. Interstitial edema seen on yesterday's examination continues to improve. There is some new subsegmental  atelectasis in the left lung base. Heart size is normal.  IMPRESSION: Continued improvement in interstitial pulmonary edema.  Increased subsegmental atelectasis left lung base.   Electronically Signed   By: Inge Rise M.D.   On: 06/08/2014 08:08   Dg Chest Port 1 View  06/07/2014   CLINICAL DATA:  Endotracheal tube.  EXAM: PORTABLE CHEST - 1 VIEW  COMPARISON:  06/05/2014.  FINDINGS: Endotracheal tube ends at the level of the mid thoracic trachea. A new orogastric tube enters the stomach at least. Interval decrease in diffuse interstitial and airspace opacities. No evidence of pneumothorax or increasing pleural fluid. Right glenohumeral arthroplasty noted.  IMPRESSION: 1. Endotracheal and orogastric tubes are in good position. 2. Decreasing pulmonary edema.   Electronically Signed   By: Jorje Guild M.D.   On: 06/07/2014 06:49   Portable Chest Xray  06/06/2014   CLINICAL DATA:  Endotracheal tube placement.  EXAM: PORTABLE CHEST - 1 VIEW  COMPARISON:  06/05/2014  FINDINGS: Interval placement of an endotracheal tube with tip measuring 4.3 cm above the carinal. Shallow inspiration. Mild cardiac enlargement with diffuse bilateral pulmonary infiltration, likely edema. Postoperative change in the right shoulder. No blunting of costophrenic angles. No pneumothorax.  IMPRESSION: Endotracheal tube tip measures 4.3 cm above the carina. Diffuse bilateral pulmonary parenchymal infiltrates again demonstrated.   Electronically Signed   By: Lucienne Capers M.D.   On: 06/06/2014 00:29   Dg Chest Port 1 View  06/05/2014   CLINICAL DATA:  Pulmonary edema.  EXAM: PORTABLE CHEST - 1 VIEW  COMPARISON:  06/03/2014  FINDINGS: Bilateral pulmonary edema has increased diffusely on the left and in the right upper lobe. Edema at the right base appears slightly improved.  Small right effusion. Heart size is within normal limits. No acute osseous abnormality.  IMPRESSION: Pulmonary edema has slightly increased.   Electronically  Signed   By: Rozetta Nunnery M.D.   On: 06/05/2014 08:15   Dg Chest Portable 1 View  06/03/2014   CLINICAL DATA:  Respiratory distress.  EXAM: PORTABLE CHEST - 1 VIEW  COMPARISON:  Chest x-ray 09/25/2010 .  FINDINGS: Cardiomegaly with pulmonary vascular prominence and interstitial prominence present consistent we congestive heart failure and pulmonary edema. No focal alveolar infiltrate. No pleural effusion or pneumothorax. Right shoulder replacement.  IMPRESSION: Congestive heart failure with pulmonary interstitial edema.   Electronically Signed   By: Marcello Moores  Register   On: 06/03/2014 10:09   Dg Abd Portable 1v  06/06/2014   CLINICAL DATA:  OG tube placement  EXAM: PORTABLE ABDOMEN - 1 VIEW  COMPARISON:  03/19/2014; 10/09/2012; CTA - 02/17/2014  FINDINGS: Interval placement of enteric tube with tip and side port overlying the expected location of the mid body of the stomach.  There is moderate gas distention of the colon without definitive gas distention of the upstream small bowel. No supine evidence of pneumoperitoneum. No definite pneumatosis or portal venous gas.  There has been interval passage of previously ingested endoscopy capsule.  Vascular calcifications.  IMPRESSION: 1. Enteric tube tip and side port projects over the mid body of the stomach. 2. Nonspecific mild gas distention of the colon could be indicative of mild ileus.   Electronically Signed   By: Sandi Mariscal M.D.   On: 06/06/2014 13:37   Dg Swallowing Func-speech Pathology  06/14/2014   Katherene Ponto Deblois, CCC-SLP     06/14/2014 10:32 AM Objective Swallowing Evaluation: Modified Barium Swallowing Study   Patient Details  Name: EUAN WANDLER MRN: [id number] Date of Birth: 03/10/1946  Today's Date: 06/14/2014 Time: 0950-1015 SLP Time Calculation (min): 25 min  Past Medical History:  Past Medical History  Diagnosis Date  . Cerebrovascular disease 2009    TIA; 2009- right ICA stent; re-intervention for restenosis  complicated by [facility] w/o sx  .  Degenerative joint disease     Total shoulder arthroplasty-right  . LBBB (left bundle branch block)     Normal echo-2011; stress nuclear in 09/2010--septal  hypoperfusion representing nontransmural infarction or the effect  of left bundle branch block, no ischemia  . Hyperlipidemia   . Hypertension   . Chronic obstructive pulmonary disease   . Tobacco abuse     -100 pack years; 1.5 packs per day  . Anxiety and depression   . Alcohol abuse     6 beers per day; hospital admission in 2009 for withdrawal  symptoms  . Thrombocytopenia   . Tubular adenoma of colon    Past Surgical History:  Past Surgical History  Procedure Laterality Date  . Lumbar fusion  2010  . Vasectomy  1971  . Colonoscopy w/ polypectomy  2011  . Total shoulder arthroplasty  2011    Right-Dr. Tamera Punt  . Colonoscopy  08/22/09    Fields-(Tubular Adenoma)3-mm transverse polyp/4-mm polyp  otherwise noraml/small internal hemorrhoids  . Esophagogastroduodenoscopy [name] 03/05/2014    Procedure: ESOPHAGOGASTRODUODENOSCOPY (EGD);  Surgeon: Danie Binder, MD;  Location: [facility];  Service: Endoscopy;   Laterality: [name];  11:45  . Agile capsule [name] 03/18/2014    Procedure: AGILE CAPSULE;  Surgeon: Danie Binder, MD;   Location: [facility];  Service: Endoscopy;  Laterality: [name];   7:30  . Givens capsule study [name] 03/30/2014    Procedure: GIVENS CAPSULE STUDY;  Surgeon: Danie Binder, MD;   Location: [facility];  Service: Endoscopy;  Laterality: [name];   7:30  . Colonoscopy [name] 04/14/2014    Procedure: COLONOSCOPY;  Surgeon: Danie Binder, MD;   Location: [facility];  Service: Endoscopy;  Laterality: [name];   9:30  HPI:  68 year old male, current smoker with PMH of COPD, alcohol abuse,  chronic bronchitis, TIA, HTN, LBBB who presented to [facility] ED via  EMS 7/9 for progressively worsening SOB and respiratory distress.  Transferred to [facility].  Intubated 7/10-7/17. NSTEMI     Assessment / Plan / Recommendation Clinical Impression  Dysphagia Diagnosis: Moderate  oral phase dysphagia;Mild  pharyngeal phase dysphagia Clinical impression: Pt presents with a moderate oral dysphagia  impacting safety with PO. Pt is moderately confused, edentulous  and tremulous with very dry oral mucosa. With most boluses, pt  will briefly hold bolus orally with occasional lingual tremors  prior to active transit. Intermittently pt has premature spillage  of partial bolus to pyriforms and sometimes pt actively transits  bolus with a delayed swallow initiation and aspiration before the  swallow. Of note, pt often clears throat post swallow, unrelated  to penetration or aspiration. Initially aspiration event was  silent, but then followed by late, hard, effective cough that  expelled thin aspirate. In some trials swallow was timely,  suggestive of a primary cognitive problem rather than a true  sensory deficit. Strength is WNL and trace residuals coating all  oropahryngeal structures likely due to dry mucosa. Recommend  nectar thick liquids as pt orally manages this bolus best and has  decreased risk of aspiration before the swallow. Will also start  pt with a puree diet, though expect as mentation improves he may  tolerate soft solids and thin liquids well.     Treatment Recommendation  Therapy as outlined in treatment plan below    Diet Recommendation Dysphagia 1 (Puree);Nectar-thick liquid   Liquid Administration via: Cup;Straw Medication Administration: Crushed with puree Supervision: Staff to assist with self feeding Compensations: Slow rate;Small sips/bites Postural Changes and/or Swallow Maneuvers: Seated upright 90  degrees;Upright 30-60 min after meal    Other  Recommendations Oral Care Recommendations: Oral care BID Other Recommendations: Order thickener from pharmacy   Follow Up Recommendations  Skilled Nursing facility    Frequency and Duration min 2x/week  2 weeks   Pertinent Vitals/Pain NA    SLP Swallow Goals     General Date of Onset: 06/03/14 HPI: 68 year old male, current smoker  with PMH of COPD, alcohol  abuse, chronic bronchitis, TIA, HTN, LBBB who presented to [facility] ED  via EMS 7/9 for progressively worsening SOB and respiratory  distress. Transferred to [facility]  Intubated 7/10-7/17. NSTEMI Type of Study: Modified Barium Swallowing Study Reason for Referral: Objectively evaluate swallowing function Previous Swallow Assessment: none per records Diet Prior to this Study: NPO Temperature Spikes Noted: No Respiratory Status: Room air History of Recent Intubation: Yes Length of Intubations (days): 7 days Date extubated: 06/11/14 Behavior/Cognition: Alert;Cooperative;Decreased sustained  attention;Requires cueing;Distractible Oral Cavity - Dentition: Edentulous Oral Motor / Sensory Function: Within functional limits Self-Feeding Abilities: Needs assist Patient Positioning: Upright in chair Baseline Vocal Quality: Breathy;Hoarse;Low vocal intensity Volitional Cough: Strong Volitional Swallow: Able to elicit Anatomy:  (dry oral mucosa) Pharyngeal Secretions: Not observed secondary MBS    Reason for Referral Objectively evaluate swallowing function   Oral Phase Oral Preparation/Oral Phase Oral Phase: Impaired Oral - Nectar Oral - Nectar Cup: Lingual/palatal residue;Delayed oral  transit;Reduced posterior propulsion;Holding of bolus;Lingual  pumping Oral - Nectar Straw: Lingual/palatal residue;Delayed oral  transit;Reduced posterior propulsion;Holding of bolus;Lingual  pumping Oral - Thin Oral - Thin Cup: Lingual/palatal residue;Delayed oral  transit;Reduced posterior propulsion;Holding of bolus;Lingual  pumping Oral - Thin Straw: Lingual/palatal residue;Delayed oral  transit;Reduced posterior propulsion;Holding of bolus;Lingual  pumping Oral - Solids Oral - Puree: Lingual/palatal residue;Delayed oral  transit;Reduced posterior propulsion;Holding of bolus;Lingual  pumping Oral - Mechanical Soft: Lingual/palatal residue;Delayed oral  transit;Reduced posterior propulsion;Holding of bolus;Lingual   pumping;Piecemeal swallowing Oral - Pill: Lingual/palatal residue;Delayed oral transit;Reduced  posterior propulsion;Holding of bolus;Lingual pumping   Pharyngeal Phase Pharyngeal Phase Pharyngeal Phase: Impaired Pharyngeal - Nectar Pharyngeal - Nectar Cup: Delayed swallow initiation;Premature  spillage to valleculae Pharyngeal - Nectar Straw: Delayed swallow initiation;Premature  spillage to valleculae Pharyngeal - Thin Pharyngeal - Thin Cup: Delayed swallow initiation;Premature  spillage to pyriform sinuses Pharyngeal - Thin Straw: Delayed swallow initiation;Premature  spillage to pyriform sinuses;Penetration/Aspiration before  swallow;Moderate aspiration Penetration/Aspiration details (thin straw): Material enters  airway, passes BELOW cords without attempt by patient to eject  out (silent aspiration) Pharyngeal - Solids Pharyngeal - Puree: Delayed swallow initiation Pharyngeal - Mechanical Soft: Delayed swallow  initiation;Premature spillage to pyriform sinuses  Cervical Esophageal Phase    GO    Cervical Esophageal Phase Cervical Esophageal Phase: [facility]        Herbie Baltimore, MA CCC-SLP [phone]  DeBlois, Katherene Ponto 06/14/2014, 10:27 AM     Medications: Scheduled Meds: . antiseptic oral rinse  15 mL Mouth Rinse QID  . aspirin  81 mg Oral Daily  . atorvastatin  80 mg Oral q1800  . chlorhexidine  15 mL Mouth Rinse BID  . clopidogrel  75 mg Oral Daily  . folic acid  1 mg Oral Daily  . heparin subcutaneous  5,000 Units Subcutaneous 3 times per day  . insulin aspart  0-15 Units Subcutaneous TID WC  . insulin aspart  0-5 Units Subcutaneous QHS  . ipratropium-albuterol  3 mL Nebulization BID  . LORazepam  1 mg Intravenous TID  . metoprolol tartrate  25 mg Oral TID  . nicotine  21 mg Transdermal Daily  . risperiDONE  0.5 mg Oral q morning - 10a  . risperiDONE  1 mg Oral QHS  . sodium chloride  3 mL Intravenous Q12H  . thiamine  100 mg Oral Daily      LOS: 13 days   Max Romano M.D. [facility] 06/16/2014, 11:12 AM Pager: [phone]  If 7PM-7AM, please contact night-coverage [email or web address] Password [email or web address]  **Disclaimer: This note was dictated with voice recognition software. Similar sounding words can inadvertently be transcribed and this note may contain transcription errors which may not have been corrected upon publication of note.**

## 2014-06-16 NOTE — Progress Notes (Signed)
CSW provided pt daughter with bed offers. Daughter has accepted placement at [facility]. CSW to assist with dc whenever pt is medically stable.  Hunt Oris, MSW, [place]

## 2014-06-16 NOTE — Progress Notes (Signed)
Patient Name: Roy [name] Date of Encounter: 06/16/2014     Active Problems:   Tobacco abuse   Alcohol withdrawal delirium   Acute respiratory failure   Pneumonia, organism unspecified   NSTEMI (non-ST elevated myocardial infarction)   Acute systolic heart failure   COPD exacerbation   Hypernatremia   Acute renal failure   Encephalopathy   Loose stools    SUBJECTIVE  Pt does not respond to questions, he is arousable and makes eye contact but does not speak. Appears to be resting and breathing comfortably. Tremor in upper extremities.  CURRENT MEDS . antiseptic oral rinse  15 mL Mouth Rinse QID  . aspirin  81 mg Oral Daily  . atorvastatin  80 mg Oral q1800  . chlorhexidine  15 mL Mouth Rinse BID  . clopidogrel  75 mg Oral Daily  . folic acid  1 mg Oral Daily  . heparin subcutaneous  5,000 Units Subcutaneous 3 times per day  . insulin aspart  0-15 Units Subcutaneous TID WC  . insulin aspart  0-5 Units Subcutaneous QHS  . ipratropium-albuterol  3 mL Nebulization BID  . LORazepam  1 mg Intravenous TID  . metoprolol tartrate  25 mg Oral TID  . nicotine  21 mg Transdermal Daily  . risperiDONE  0.5 mg Oral q morning - 10a  . risperiDONE  1 mg Oral QHS  . sodium chloride  3 mL Intravenous Q12H  . thiamine  100 mg Oral Daily    OBJECTIVE  Filed Vitals:   06/15/14 2013 06/16/14 0642 06/16/14 0900 06/16/14 0943  BP: 134/70 117/78  121/56  Pulse: 78 79  95  Temp: 98 F (36.7 C) 98 F (36.7 C)    TempSrc: Axillary Axillary    Resp: 20 22    Height:      Weight:  132 lb 15 oz (60.3 kg)    SpO2: 100% 100% 99%     Intake/Output Summary (Last 24 hours) at 06/16/14 1019 Last data filed at 06/15/14 1851  Gross per 24 hour  Intake      0 ml  Output      0 ml  Net      0 ml   Filed Weights   06/15/14 0500 06/15/14 0714 06/16/14 0642  Weight: 132 lb 11.5 oz (60.2 kg) 132 lb 11.5 oz (60.2 kg) 132 lb 15 oz (60.3 kg)    PHYSICAL EXAM  General: Pleasant,  NAD. Neuro: doesn't speak.  Will make eye contact Psych: appears calm, sleeping/watching TV HEENT:  Normal  Neck: Supple without bruits or JVD. Lungs:  Resp regular and unlabored, distant lung sounds with diffuse crackles. Heart: distant heart sounds. RRR no s3, s4, or murmurs. Abdomen: Soft, non-tender, non-distended, BS + x 4.  Extremities: No clubbing, cyanosis or edema. DP/PT/Radials 2+ and equal bilaterally.  Accessory Clinical Findings  CBC  Recent Labs  06/14/14 0350 06/15/14 0546  WBC 15.5* 13.4*  HGB 11.9* 10.8*  HCT 38.1* 34.4*  MCV 90.3 89.4  PLT 581* 417*   Basic Metabolic Panel  Recent Labs  06/14/14 0350  06/16/14 0042 06/16/14 0043  NA 156*  < > 160* 161*  K 3.8  < > 3.5* 3.6*  CL 107  < > 120* 120*  CO2 22  < > 20 20  GLUCOSE 152*  < > 170* 173*  BUN 177*  < > 153* 156*  CREATININE 4.60*  < > 3.70* 3.76*  CALCIUM 9.9  < > 9.6  9.5  MG 3.6*  --   --   --   < > = values in this interval not displayed.   Thyroid Function Tests  Recent Labs  06/16/14 0042  TSH 1.650    TELE  Sinus with chronic LBBB and freq PVCs  Radiology/Studies  Dg Chest Port 1 View  06/11/2014   CLINICAL DATA:  Respiratory distress.  Intubated patient.  EXAM: PORTABLE CHEST - 1 VIEW  COMPARISON:  Single view of the chest 06/10/2014 and 06/09/2014.  FINDINGS: ET tube remains in place and projects in good position. NG tube is seen with the side port at the gastroesophageal junction. Recommend advancement of 4-5 cm. Lungs are clear. Heart size is normal. No pneumothorax or pleural effusion. Right shoulder replacement noted.  IMPRESSION: Side port of the NG tube is at the gastroesophageal junction. Recommend advancement 4-5 cm.  ET tube in good position.  Lungs clear.   Electronically Signed   By: Inge Rise M.D.   On: 06/11/2014 08:00   Dg Chest Port 1 View  06/10/2014   CLINICAL DATA:  Assess endotracheal tube  EXAM: PORTABLE CHEST - 1 VIEW  COMPARISON:  06/09/2014   FINDINGS: There is an endotracheal to which ends between the clavicles and carina. An orogastric tube enters the stomach.  Normal heart size and mediastinal contours for technique.  Improved basilar lung aeration, with the left diaphragm now visible. No effusion or pneumothorax.  IMPRESSION: 1. Well-positioned endotracheal and orogastric tubes. 2. Continued improvement in basilar lung aeration.   Electronically Signed   By: Jorje Guild M.D.   On: 06/10/2014 06:44   Dg Chest Port 1 View  06/09/2014   CLINICAL DATA:  Respiratory failure.  EXAM: PORTABLE CHEST - 1 VIEW  COMPARISON:  06/08/2014  FINDINGS: Endotracheal tube tip is approximately 4 cm above the carina. Lungs show improved aeration at the left base with mild residual atelectasis remaining. There is no evidence of pulmonary edema, consolidation, pneumothorax, nodule or pleural fluid. The heart size and mediastinal contours are within normal limits. Nasogastric tube continues to extend below the diaphragm.  IMPRESSION: Improved aeration at the left lung base.   Electronically Signed   By: Aletta Edouard M.D.   On: 06/09/2014 07:44   Dg Chest Port 1 View  06/08/2014   CLINICAL DATA:  COPD.  Respiratory failure.  EXAM: PORTABLE CHEST - 1 VIEW  COMPARISON:  Single view of the chest 06/07/2014 and 06/05/2014.  FINDINGS: Support tubes and lines are unchanged. No pneumothorax is identified. Interstitial edema seen on yesterday's examination continues to improve. There is some new subsegmental atelectasis in the left lung base. Heart size is normal.  IMPRESSION: Continued improvement in interstitial pulmonary edema.  Increased subsegmental atelectasis left lung base.   Electronically Signed   By: Inge Rise M.D.   On: 06/08/2014 08:08   Dg Chest Port 1 View  06/07/2014   CLINICAL DATA:  Endotracheal tube.  EXAM: PORTABLE CHEST - 1 VIEW  COMPARISON:  06/05/2014.  FINDINGS: Endotracheal tube ends at the level of the mid thoracic trachea. A new  orogastric tube enters the stomach at least. Interval decrease in diffuse interstitial and airspace opacities. No evidence of pneumothorax or increasing pleural fluid. Right glenohumeral arthroplasty noted.  IMPRESSION: 1. Endotracheal and orogastric tubes are in good position. 2. Decreasing pulmonary edema.   Electronically Signed   By: Jorje Guild M.D.   On: 06/07/2014 06:49   Portable Chest Xray  06/06/2014   CLINICAL  DATA:  Endotracheal tube placement.  EXAM: PORTABLE CHEST - 1 VIEW  COMPARISON:  06/05/2014  FINDINGS: Interval placement of an endotracheal tube with tip measuring 4.3 cm above the carinal. Shallow inspiration. Mild cardiac enlargement with diffuse bilateral pulmonary infiltration, likely edema. Postoperative change in the right shoulder. No blunting of costophrenic angles. No pneumothorax.  IMPRESSION: Endotracheal tube tip measures 4.3 cm above the carina. Diffuse bilateral pulmonary parenchymal infiltrates again demonstrated.   Electronically Signed   By: Lucienne Capers M.D.   On: 06/06/2014 00:29   Dg Chest Port 1 View  06/05/2014   CLINICAL DATA:  Pulmonary edema.  EXAM: PORTABLE CHEST - 1 VIEW  COMPARISON:  06/03/2014  FINDINGS: Bilateral pulmonary edema has increased diffusely on the left and in the right upper lobe. Edema at the right base appears slightly improved.  Small right effusion. Heart size is within normal limits. No acute osseous abnormality.  IMPRESSION: Pulmonary edema has slightly increased.   Electronically Signed   By: Rozetta Nunnery M.D.   On: 06/05/2014 08:15   Dg Chest Portable 1 View  06/03/2014   CLINICAL DATA:  Respiratory distress.  EXAM: PORTABLE CHEST - 1 VIEW  COMPARISON:  Chest x-ray 09/25/2010 .  FINDINGS: Cardiomegaly with pulmonary vascular prominence and interstitial prominence present consistent we congestive heart failure and pulmonary edema. No focal alveolar infiltrate. No pleural effusion or pneumothorax. Right shoulder replacement.   IMPRESSION: Congestive heart failure with pulmonary interstitial edema.   Electronically Signed   By: Marcello Moores  Register   On: 06/03/2014 10:09   Dg Abd Portable 1v  06/06/2014   CLINICAL DATA:  OG tube placement  EXAM: PORTABLE ABDOMEN - 1 VIEW  COMPARISON:  03/19/2014; 10/09/2012; CTA - 02/17/2014  FINDINGS: Interval placement of enteric tube with tip and side port overlying the expected location of the mid body of the stomach.  There is moderate gas distention of the colon without definitive gas distention of the upstream small bowel. No supine evidence of pneumoperitoneum. No definite pneumatosis or portal venous gas.  There has been interval passage of previously ingested endoscopy capsule.  Vascular calcifications.  IMPRESSION: 1. Enteric tube tip and side port projects over the mid body of the stomach. 2. Nonspecific mild gas distention of the colon could be indicative of mild ileus.   Electronically Signed   By: Sandi Mariscal M.D.   On: 06/06/2014 13:37   Dg Swallowing Func-speech Pathology  06/14/2014  Assessment / Plan / Recommendation Clinical Impression  Dysphagia Diagnosis: Moderate oral phase dysphagia;Mild  pharyngeal phase dysphagia Clinical impression: Pt presents with a moderate oral dysphagia  impacting safety with PO. Pt is moderately confused, edentulous  and tremulous with very dry oral mucosa. With most boluses, pt  will briefly hold bolus orally with occasional lingual tremors  prior to active transit. Intermittently pt has premature spillage  of partial bolus to pyriforms and sometimes pt actively transits  bolus with a delayed swallow initiation and aspiration before the  swallow. Of note, pt often clears throat post swallow, unrelated  to penetration or aspiration. Initially aspiration event was  silent, but then followed by late, hard, effective cough that  expelled thin aspirate. In some trials swallow was timely,  suggestive of a primary cognitive problem rather than a true  sensory  deficit. Strength is WNL and trace residuals coating all  oropahryngeal structures likely due to dry mucosa. Recommend  nectar thick liquids as pt orally manages this bolus best and has  decreased risk  of aspiration before the swallow. Will also start  pt with a puree diet, though expect as mentation improves he may  tolerate soft solids and thin liquids well.     Treatment Recommendation  Therapy as outlined in treatment plan below    Diet Recommendation Dysphagia 1 (Puree);Nectar-thick liquid   Liquid Administration via: Cup;Straw Medication Administration: Crushed with puree Supervision: Staff to assist with self feeding Compensations: Slow rate;Small sips/bites Postural Changes and/or Swallow Maneuvers: Seated upright 90  degrees;Upright 30-60 min after meal    Other  Recommendations Oral Care Recommendations: Oral care BID Other Recommendations: Order thickener from pharmacy   Follow Up Recommendations  Skilled Nursing facility    Frequency and Duration min 2x/week  2 weeks   Pertinent Vitals/Pain NA    SLP Swallow Goals     ASSESSMENT AND PLAN  Roy [name] is a 68 y.o. male with a history of active smoker (120 pack years) with COPD, alcoholism, CVD with intracerebral stenting who was admitted 06/03/14 with pneumonia and hypoxemic respiratory failure and found to have NSTEMI.  Acute respiratory failure: secondary to acute CHF, CAP, COPD exacerbation: Improved and followed by IM.  NSTEMI: Medical management only, due to renal failure and altered mental status.   - On metoprolol and plavix.  Acute systolic heart failure: Ejection fraction 20%, presumably ischemic. Currently volume depleted and prerenal.  Hx of ETOH abuse  CV disease: s/p intracranial stent.   - on plavix  PAD  Acute renal failure: followed by nephrology - holding ACE/ARB due to renal function - SCr 3.76 - BUN 156  Loose stools: C. difficile negative.   Signed,  Anson Crofts PA-S  Signed, Perry Mount  PA-C  Pager [phone]  Patient seen and examined and history reviewed. Agree with above findings and plan. No real change in status. Renal parameters improving slowly. Still encephalopathic. Hypernatremia persists. No respiratory distress. Receiving free water. Continue metoprolol and plavix.   Peter Martinique, [place] 06/16/2014 1:09 PM

## 2014-06-16 NOTE — Progress Notes (Signed)
Clinical Social Work Department BRIEF PSYCHOSOCIAL ASSESSMENT 06/16/2014  Patient:  Roy [name],Roy [name]     Account Number:  [id number]     Admit date:  06/03/2014  Clinical Social Worker:  Valda Lamb  Date/Time:  06/16/2014 10:06 AM  Referred by:  Physician  Date Referred:  06/16/2014 Referred for  SNF Placement   Other Referral:   Interview type:  Patient Other interview type:   CSW attempted to assess pt however he was nonverbal.    CSW contacted pt wife and spoke to her and daughter [name].    PSYCHOSOCIAL DATA Living Status:  WIFE Admitted from facility:   Level of care:   Primary support name:  Aeric Burnham [phone] Primary support relationship to patient:  SPOUSE Degree of support available:   Pt spouse and daughter present actively involved in pt care.    CURRENT CONCERNS Current Concerns  Post-Acute Placement   Other Concerns:    SOCIAL WORK ASSESSMENT / PLAN Assisting CSW informed that pt is ready for discharge and no longer qualifies for LTAC.    CSW visited pt room to assess however pt would not respond to CSW. Pt did shake his head up and down when CSW asked if CSW could contact his wife.    CSW contacted pt wife and informed her of recommendations for SNF placement. Wife asked CSW if pt could come home with [facility] services. CSW provided daughter with education regarding [facility] services and that pt appears to need significant amount of support at home. Wife  stated her daughter is HCPOA and would like for CSW to speak to her daughter. CSW spoke with daughter who stated, "I want whatever is recommended, I want him to get better so he can come home." Daughter stated she would be agreeable to placement in [place] or [place] if need be.    CSW to do a SNF search and submit for a pasrr.   Assessment/plan status:  Psychosocial Support/Ongoing Assessment of Needs Other assessment/ plan:   Information/referral to community resources:   CSW to  provide a SNF list and CSW contact information.    PATIENT'S/FAMILY'S RESPONSE TO PLAN OF CARE: CSW unable to complete assessment with pt who was non verbal. CSW completed assessment with pt daughter and wife who are agreeable to discharge plans.    CSW to remain following.       Hunt Oris, MSW, [place]

## 2014-06-16 NOTE — Progress Notes (Signed)
Patient alert and oriented to self and time. He was able to tell me what year it is, but unable to tell me his birthday.  Pt is still hoarse when speaking. Breakfast was fed to patient, but he didn't eat very much.  His anxiousness and agitation has calmed tremendously with the ativan that he is receiving. Pt is not shaking as he was on yesterday.

## 2014-06-16 NOTE — Progress Notes (Signed)
Physical Therapy Treatment Patient Details Name: Roy [name] MRN: [id number] DOB: June 24, 1946 Today's Date: 06/16/2014    History of Present Illness Patient is a 68 yo male admitted 06/03/14 with respiratory failure, CHF/COPD, NSTEMI, acute encephalopaty (? ETOH w/d per chart).  Patient intubated 06/04/14, and extubated 06/11/14.  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     PT Comments    Pt with limited participation this session. Per RN, pt on a new Ativan schedule and states he has been very lethargic throughout the day. Pt with minimal trunk activation during sitting balance activities, and little active movement noted in upper or lower extremities. Pt positioned with pillow under R side of trunk and towel roll on R side of neck for more neutral position while supine. Will see pt for one more session to assess appropriateness for continued PT services.   Follow Up Recommendations  LTACH;Supervision/Assistance - 24 hour     Equipment Recommendations  Rolling walker with 5" wheels    Recommendations for Other Services       Precautions / Restrictions Precautions Precautions: Fall Restrictions Weight Bearing Restrictions: No    Mobility  Bed Mobility Overal bed mobility: Needs Assistance;+2 for physical assistance Bed Mobility: Rolling;Sidelying to Sit;Sit to Supine Rolling: Total assist;+2 for physical assistance Sidelying to sit: Total assist;+2 for physical assistance   Sit to supine: Total assist;+2 for physical assistance   General bed mobility comments: Pt with minimal active movement throughout transfer to/from EOB.   Transfers                 General transfer comment: Unable to attempt transfers this session.   Ambulation/Gait                 Stairs            Wheelchair Mobility    Modified Rankin (Stroke Patients Only)       Balance  Overall balance assessment: Needs assistance Sitting-balance support: Feet supported;Bilateral upper extremity supported Sitting balance-Leahy Scale: Zero Sitting balance - Comments: The longest pt was able to maintain seated posture without assist was 12 seconds.  Postural control: Right lateral lean;Posterior lean                          Cognition Arousal/Alertness: Awake/alert Behavior During Therapy: Flat affect Overall Cognitive Status: Difficult to assess                      Exercises Other Exercises Other Exercises: x5 lateral leans onto L elbow and pushing back up. Minimal participation.  Other Exercises: Worked on sitting balance x8 minutes.     General Comments        Pertinent Vitals/Pain Vitals stable throughout session.     Home Living                      Prior Function            PT Goals (current goals can now be found in the care plan section) Acute Rehab PT Goals Patient Stated Goal: None stated  PT Goal Formulation: With patient Time For Goal Achievement: 06/26/14 Potential to Achieve Goals: Good Progress towards PT goals: Not progressing toward goals - comment (Pt required increased assist this session for all mobility)    Frequency  Min 2X/week    PT Plan Frequency  needs to be updated    Co-evaluation             End of Session Equipment Utilized During Treatment: Gait belt Activity Tolerance: Patient limited by lethargy Patient left: in bed;with call bell/phone within reach;with bed alarm set     Time: 1510-1526 PT Time Calculation (min): 16 min  Charges:  $Therapeutic Activity: 8-22 mins                    G Codes:      Jolyn Lent 14-Jul-2014, 5:32 PM  Jolyn Lent, PT, DPT Acute Rehabilitation Services Pager: [phone]

## 2014-06-16 NOTE — Progress Notes (Signed)
[facility] Progress Note    Assessment:  1. Acute kidney injury - appears to be volume related although certainly possible he has developed ATN since hospitalization. His MAP has also dropped since ~7/18 with intermittent and sometimes persistent hypotensive episodes.  2. Hypernatremia - related to volume status and patient unlikely to have been consuming much free water. Currently his free water deficit is 4.7L. Of note his weight has decreased from 69kg at admission to 58kg to 60.3kg which is not consistent with the net fluid I&O's even accounting basal fluid losses. If no improvement will start workup for diabetes insipidus but diaper is not that wet suggesting that there is not a large amount of free water loss through the urine. - over night his sodium was dropping nicely and now suddenly it's elevated yet again. He does not have a foley in place making I&O's very difficult. But reportedly he has had loose bowel movements.  3. 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 COPD 5. Confusion Plan  1. I am going to treat this as a chronic hypernatremia. Free water repletion with d5w at 179ml/hr would result in a Sna of 140 within 24hrs assuming no further free water loss.  2. Because of his history of CHF with an EF of 20% and subacute history of hypernatremia I started gently  with 1/2NS at 155ml/hr then converted to D5W.  3. Increased the D5W to 136ml/hr this morning. 4. Recheck a stat chemistry 4 hrs later --> adjust as needed. Goal will be to drop the SNa by ~8-10 over the next 24 hrs.  5. The renal function should improve with hydration as he's still markedly volume down if the weights are to be believed (down 11kg since hospitalization).  6. Post void bladder scan was not impressive.  7. I was concerned that he would pull a foley out but at this point with lack of improvement in the SNa we need to know the UOP. Will have nursing place foley for strict I&O's. Certainly after lasix administration  there is decreased concentration capability. 8. Will now also check a Urine Na and K to calculate free water loss in the urine. 9. Goal will be to have him +2 liters in the next 24hrs which will be easier with the foley to allow adjustment of fluid intake.  Subjective:   Confused with tremors; he was much calmer last night when his spouse was bedside. Unclear if ROS is accurate at all as he answers by nodding yes to almost every question.   According to the nursing staff he is usually breathing comfortably but then becomes more short of breath when he's excited.       Objective:   BP 117/78  Pulse 79  Temp(Src) 98 F (36.7 C) (Axillary)  Resp 22  Ht 5\' 6"  (1.676 m)  Wt 60.3 kg (132 lb 15 oz)  BMI 21.47 kg/m2  SpO2 100%  Weight change: 0 kg (0 lb)  Physical Exam: General appearance: appears stated age, cachectic, distracted and slowed mentation  Head: Normocephalic, without obvious abnormality, atraumatic  Eyes: negative  Throat: normal findings: palate normal and tongue midline and normal  Neck: no adenopathy, no carotid bruit, no JVD, supple, symmetrical, trachea midline and thyroid not enlarged, symmetric, no tenderness/mass/nodules  Back: negative, symmetric, no curvature. ROM normal. No CVA tenderness.  Resp: diminished breath sounds anterior - bilateral and bibasilar  Chest wall: no tenderness  Cardio: S1, S2 normal  GI: soft, non-tender; bowel sounds normal; no  masses, no organomegaly  Male genitalia: normal  Extremities: extremities normal, atraumatic, no cyanosis or edema  Neurologic: Mental status:confused   Imaging: Dg Swallowing Func-speech Pathology  06/14/2014   Katherene Ponto [place], CCC-SLP     06/14/2014 10:32 AM Objective Swallowing Evaluation: Modified Barium Swallowing Study   Patient Details  Name: Roy [name] MRN: [id number] Date of Birth: June 18, 1946  Today's Date: 06/14/2014 Time: 0950-1015 SLP Time Calculation (min): 25 min  Past Medical History:   Past Medical History  Diagnosis Date  . Cerebrovascular disease 2009    TIA; 2009- right ICA stent; re-intervention for restenosis  complicated by [facility] w/o sx  . Degenerative joint disease     Total shoulder arthroplasty-right  . LBBB (left bundle branch block)     Normal echo-2011; stress nuclear in 09/2010--septal  hypoperfusion representing nontransmural infarction or the effect  of left bundle branch block, no ischemia  . Hyperlipidemia   . Hypertension   . Chronic obstructive pulmonary disease   . Tobacco abuse     -100 pack years; 1.5 packs per day  . Anxiety and depression   . Alcohol abuse     6 beers per day; hospital admission in 2009 for withdrawal  symptoms  . Thrombocytopenia   . Tubular adenoma of colon    Past Surgical History:  Past Surgical History  Procedure Laterality Date  . Lumbar fusion  2010  . Vasectomy  1971  . Colonoscopy w/ polypectomy  2011  . Total shoulder arthroplasty  2011    Right-Dr. Tamera Punt  . Colonoscopy  08/22/09    Fields-(Tubular Adenoma)3-mm transverse polyp/4-mm polyp  otherwise noraml/small internal hemorrhoids  . Esophagogastroduodenoscopy [name] 03/05/2014    Procedure: ESOPHAGOGASTRODUODENOSCOPY (EGD);  Surgeon: Danie Binder, MD;  Location: [facility];  Service: Endoscopy;   Laterality: [name];  11:45  . Agile capsule [name] 03/18/2014    Procedure: AGILE CAPSULE;  Surgeon: Danie Binder, MD;   Location: [facility];  Service: Endoscopy;  Laterality: [name];   7:30  . Givens capsule study [name] 03/30/2014    Procedure: GIVENS CAPSULE STUDY;  Surgeon: Danie Binder, MD;   Location: [facility];  Service: Endoscopy;  Laterality: [name];   7:30  . Colonoscopy [name] 04/14/2014    Procedure: COLONOSCOPY;  Surgeon: Danie Binder, MD;   Location: [facility];  Service: Endoscopy;  Laterality: [name];   9:26   HPI:  68 year old male, current smoker with PMH of COPD, alcohol abuse,  chronic bronchitis, TIA, HTN, LBBB who presented to [facility] ED via  EMS 7/9 for progressively worsening SOB and  respiratory distress.  Transferred to [facility].  Intubated 7/10-7/17. NSTEMI     Assessment / Plan / Recommendation Clinical Impression  Dysphagia Diagnosis: Moderate oral phase dysphagia;Mild  pharyngeal phase dysphagia Clinical impression: Pt presents with a moderate oral dysphagia  impacting safety with PO. Pt is moderately confused, edentulous  and tremulous with very dry oral mucosa. With most boluses, pt  will briefly hold bolus orally with occasional lingual tremors  prior to active transit. Intermittently pt has premature spillage  of partial bolus to pyriforms and sometimes pt actively transits  bolus with a delayed swallow initiation and aspiration before the  swallow. Of note, pt often clears throat post swallow, unrelated  to penetration or aspiration. Initially aspiration event was  silent, but then followed by late, hard, effective cough that  expelled thin aspirate. In some trials swallow was timely,  suggestive of a primary  cognitive problem rather than a true  sensory deficit. Strength is WNL and trace residuals coating all  oropahryngeal structures likely due to dry mucosa. Recommend  nectar thick liquids as pt orally manages this bolus best and has  decreased risk of aspiration before the swallow. Will also start  pt with a puree diet, though expect as mentation improves he may  tolerate soft solids and thin liquids well.     Treatment Recommendation  Therapy as outlined in treatment plan below    Diet Recommendation Dysphagia 1 (Puree);Nectar-thick liquid   Liquid Administration via: Cup;Straw Medication Administration: Crushed with puree Supervision: Staff to assist with self feeding Compensations: Slow rate;Small sips/bites Postural Changes and/or Swallow Maneuvers: Seated upright 90  degrees;Upright 30-60 min after meal    Other  Recommendations Oral Care Recommendations: Oral care BID Other Recommendations: Order thickener from pharmacy   Follow Up Recommendations  Skilled Nursing facility     Frequency and Duration min 2x/week  2 weeks   Pertinent Vitals/Pain NA    SLP Swallow Goals     General Date of Onset: 06/03/14 HPI: 68 year old male, current smoker with PMH of COPD, alcohol  abuse, chronic bronchitis, TIA, HTN, LBBB who presented to [facility] ED  via EMS 7/9 for progressively worsening SOB and respiratory  distress. Transferred to [facility].  Intubated 7/10-7/17. NSTEMI Type of Study: Modified Barium Swallowing Study Reason for Referral: Objectively evaluate swallowing function Previous Swallow Assessment: none per records Diet Prior to this Study: NPO Temperature Spikes Noted: No Respiratory Status: Room air History of Recent Intubation: Yes Length of Intubations (days): 7 days Date extubated: 06/11/14 Behavior/Cognition: Alert;Cooperative;Decreased sustained  attention;Requires cueing;Distractible Oral Cavity - Dentition: Edentulous Oral Motor / Sensory Function: Within functional limits Self-Feeding Abilities: Needs assist Patient Positioning: Upright in chair Baseline Vocal Quality: Breathy;Hoarse;Low vocal intensity Volitional Cough: Strong Volitional Swallow: Able to elicit Anatomy:  (dry oral mucosa) Pharyngeal Secretions: Not observed secondary MBS    Reason for Referral Objectively evaluate swallowing function   Oral Phase Oral Preparation/Oral Phase Oral Phase: Impaired Oral - Nectar Oral - Nectar Cup: Lingual/palatal residue;Delayed oral  transit;Reduced posterior propulsion;Holding of bolus;Lingual  pumping Oral - Nectar Straw: Lingual/palatal residue;Delayed oral  transit;Reduced posterior propulsion;Holding of bolus;Lingual  pumping Oral - Thin Oral - Thin Cup: Lingual/palatal residue;Delayed oral  transit;Reduced posterior propulsion;Holding of bolus;Lingual  pumping Oral - Thin Straw: Lingual/palatal residue;Delayed oral  transit;Reduced posterior propulsion;Holding of bolus;Lingual  pumping Oral - Solids Oral - Puree: Lingual/palatal residue;Delayed oral  transit;Reduced posterior  propulsion;Holding of bolus;Lingual  pumping Oral - Mechanical Soft: Lingual/palatal residue;Delayed oral  transit;Reduced posterior propulsion;Holding of bolus;Lingual  pumping;Piecemeal swallowing Oral - Pill: Lingual/palatal residue;Delayed oral transit;Reduced  posterior propulsion;Holding of bolus;Lingual pumping   Pharyngeal Phase Pharyngeal Phase Pharyngeal Phase: Impaired Pharyngeal - Nectar Pharyngeal - Nectar Cup: Delayed swallow initiation;Premature  spillage to valleculae Pharyngeal - Nectar Straw: Delayed swallow initiation;Premature  spillage to valleculae Pharyngeal - Thin Pharyngeal - Thin Cup: Delayed swallow initiation;Premature  spillage to pyriform sinuses Pharyngeal - Thin Straw: Delayed swallow initiation;Premature  spillage to pyriform sinuses;Penetration/Aspiration before  swallow;Moderate aspiration Penetration/Aspiration details (thin straw): Material enters  airway, passes BELOW cords without attempt by patient to eject  out (silent aspiration) Pharyngeal - Solids Pharyngeal - Puree: Delayed swallow initiation Pharyngeal - Mechanical Soft: Delayed swallow  initiation;Premature spillage to pyriform sinuses  Cervical Esophageal Phase    GO    Cervical Esophageal Phase Cervical Esophageal Phase: [facility]        Herbie Baltimore, MA CCC-SLP  [phone]  Lynann Beaver 06/14/2014, 10:27 AM     Labs: BMET  Recent Labs Lab 06/10/14 0246  06/11/14 0306  06/12/14 9562  06/14/14 2242 06/15/14 0546 06/15/14 0845 06/15/14 1215 06/15/14 1930 06/16/14 0042 06/16/14 0043  NA 147  < > 144  < > 147  < > 155* 162* 162* 161* 158* 160* 161*  K 3.3*  < > 4.1  < > 3.6*  < > 3.6* 3.7 3.8 3.6* 3.6* 3.5* 3.6*  CL 92*  < > 93*  < > 94*  < > 113* 116* 117* 119* 117* 120* 120*  CO2 31  < > 29  < > 31  < > 21 20 19  17* 18* 20 20  GLUCOSE 185*  < > 175*  < > 146*  < > 131* 123* 126* 156* 201* 170* 173*  BUN 66*  < > 100*  < > 110*  < > 184* 178* 171* 172* 163* 153* 156*  CREATININE 1.02  < > 1.18   < > 1.14  < > 4.88* 4.61* 4.38* 4.23* 3.87* 3.70* 3.76*  CALCIUM 10.6*  < > 10.6*  < > 11.0*  < > 9.6 9.4 9.4 9.5 9.4 9.6 9.5  PHOS 7.1*  --  6.4*  --  5.5*  --   --   --   --   --   --   --   --   < > = values in this interval not displayed. CBC  Recent Labs Lab 06/10/14 0246  06/12/14 0311 06/13/14 0220 06/14/14 0350 06/15/14 0546  WBC 18.8*  < > 13.7* 16.5* 15.5* 13.4*  NEUTROABS 15.2*  --   --   --   --   --   HGB 12.8*  < > 13.5 12.7* 11.9* 10.8*  HCT 39.6  < > 42.0 39.1 38.1* 34.4*  MCV 88.8  < > 90.7 89.7 90.3 89.4  PLT 536*  < > 699* 791* 581* 500*  < > = values in this interval not displayed.  Medications:    . antiseptic oral rinse  15 mL Mouth Rinse QID  . aspirin  81 mg Oral Daily  . atorvastatin  80 mg Oral q1800  . chlorhexidine  15 mL Mouth Rinse BID  . clopidogrel  75 mg Oral Daily  . folic acid  1 mg Oral Daily  . heparin subcutaneous  5,000 Units Subcutaneous 3 times per day  . insulin aspart  0-15 Units Subcutaneous TID WC  . insulin aspart  0-5 Units Subcutaneous QHS  . ipratropium-albuterol  3 mL Nebulization BID  . LORazepam  1 mg Intravenous TID  . metoprolol tartrate  25 mg Oral TID  . nicotine  21 mg Transdermal Daily  . risperiDONE  0.5 mg Oral q morning - 10a  . risperiDONE  1 mg Oral QHS  . sodium chloride  3 mL Intravenous Q12H  . thiamine  100 mg Oral Daily      Otelia Santee, MD 06/16/2014, 7:57 AM

## 2014-06-17 ENCOUNTER — Inpatient Hospital Stay (HOSPITAL_COMMUNITY): Payer: Medicare Other

## 2014-06-17 LAB — BASIC METABOLIC PANEL
ANION GAP: 16 — AB (ref 5–15)
Anion gap: 16 — ABNORMAL HIGH (ref 5–15)
Anion gap: 16 — ABNORMAL HIGH (ref 5–15)
BUN: 105 mg/dL — ABNORMAL HIGH (ref 6–23)
BUN: 91 mg/dL — ABNORMAL HIGH (ref 6–23)
BUN: 87 mg/dL — ABNORMAL HIGH (ref 6–23)
CALCIUM: 9.2 mg/dL (ref 8.4–10.5)
CHLORIDE: 114 meq/L — AB (ref 96–112)
CHLORIDE: 112 meq/L (ref 96–112)
CO2: 20 meq/L (ref 19–32)
CO2: 21 mEq/L (ref 19–32)
CO2: 22 mEq/L (ref 19–32)
CREATININE: 2.21 mg/dL — AB (ref 0.50–1.35)
CREATININE: 2.18 mg/dL — AB (ref 0.50–1.35)
Calcium: 9.2 mg/dL (ref 8.4–10.5)
Calcium: 9.3 mg/dL (ref 8.4–10.5)
Chloride: 112 mEq/L (ref 96–112)
Creatinine, Ser: 2.43 mg/dL — ABNORMAL HIGH (ref 0.50–1.35)
GFR calc Af Amer: 30 mL/min — ABNORMAL LOW (ref >90)
GFR calc non Af Amer: 29 mL/min — ABNORMAL LOW (ref >90)
GFR calc non Af Amer: 30 mL/min — ABNORMAL LOW (ref >90)
GFR, EST AFRICAN AMERICAN: 34 mL/min — AB (ref >90)
GFR, EST AFRICAN AMERICAN: 34 mL/min — AB (ref >90)
GFR, EST NON AFRICAN AMERICAN: 26 mL/min — AB (ref >90)
GLUCOSE: 158 mg/dL — AB (ref 70–99)
Glucose, Bld: 131 mg/dL — ABNORMAL HIGH (ref 70–99)
Glucose, Bld: 189 mg/dL — ABNORMAL HIGH (ref 70–99)
POTASSIUM: 3.1 meq/L — AB (ref 3.7–5.3)
Potassium: 3.5 mEq/L — ABNORMAL LOW (ref 3.7–5.3)
Potassium: 3.8 mEq/L (ref 3.7–5.3)
SODIUM: 150 meq/L — AB (ref 137–147)
Sodium: 149 mEq/L — ABNORMAL HIGH (ref 137–147)
Sodium: 150 mEq/L — ABNORMAL HIGH (ref 137–147)

## 2014-06-17 LAB — GLUCOSE, CAPILLARY
GLUCOSE-CAPILLARY: 159 mg/dL — AB (ref 70–99)
GLUCOSE-CAPILLARY: 170 mg/dL — AB (ref 70–99)
Glucose-Capillary: 125 mg/dL — ABNORMAL HIGH (ref 70–99)
Glucose-Capillary: 143 mg/dL — ABNORMAL HIGH (ref 70–99)

## 2014-06-17 MED ORDER — DEXTROSE 5 % IV SOLN
INTRAVENOUS | Status: DC
  Administered 2014-06-17: 800 mL via INTRAVENOUS

## 2014-06-17 MED ORDER — POTASSIUM CHLORIDE 10 MEQ/100ML IV SOLN
10.0000 meq | INTRAVENOUS | Status: AC
  Administered 2014-06-17 (×2): 10 meq via INTRAVENOUS
  Filled 2014-06-17 (×2): qty 100

## 2014-06-17 NOTE — Progress Notes (Signed)
[facility] Progress Note    Assessment/ Plan:   Assessment:  1. Acute kidney injury - appears to be volume related although certainly possible he has developed ATN since hospitalization. His MAP has also dropped since ~7/18 with intermittent and sometimes persistent hypotensive episodes.  2. Hypernatremia - related to volume status and patient unlikely to have been consuming much free water. Initially his free water deficit was 4.7L. Of note his weight has decreased from 69kg at admission to 58kg to 60.3kg which is not consistent with the net fluid I&O's even accounting basal fluid losses. If no improvement will start workup for diabetes insipidus but diaper is not that wet suggesting that there is not a large amount of free water loss through the urine. - over night his sodium was dropping nicely - I ordered the d5w to be held and personally shut off the d5w @ ~11:00 - No complaints from the patient and PERRLA on exam with good grip strength  3. 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 COPD 5. Confusion Plan  1. SNa 161 at 0043 (7/22) to 150 at 0405 (7/23) which is a correction of 11 over a span of 27 hours; therefore, I don't believe we are correcting at the recommended rate to decrease risk of cerebral edema.  2. However, there are still many variables and I want to be conservative by stopping the D5W and rechecking a chemistry panel to find out where the SNa is.  3. Most likely will need to restart the d5W @ a slower rate.  4. Reqested a STAT chemistry panel and then again in 3 hours. 5. Renal function is improving as expected. 6. Post void bladder scan was not impressive.    Subjective:   No events overnight. He is still confused which may be his baseline. His wife is not bedside this AM. Patient is not restrained and following some simple commands.   Objective:   BP 126/60  Pulse 77  Temp(Src) 98.1 F (36.7 C) (Oral)  Resp 18  Ht 5\' 6"  (1.676 m)  Wt 59.78 kg (131 lb 12.7 oz)   BMI 21.28 kg/m2  SpO2 98%  Intake/Output Summary (Last 24 hours) at 06/17/14 1144 Last data filed at 06/17/14 1127  Gross per 24 hour  Intake   2070 ml  Output   1550 ml  Net    520 ml   Weight change: -0.42 kg (-14.8 oz)  Physical Exam: General appearance: appears stated age, cachectic, distracted and slowed mentation  Head: Normocephalic, without obvious abnormality, atraumatic  Eyes: negative  Throat: normal findings: palate normal and tongue midline and normal  Neck: no adenopathy, no carotid bruit, no JVD, supple, symmetrical, trachea midline and thyroid not enlarged, symmetric, no tenderness/mass/nodules  Back: negative, symmetric, no curvature. ROM normal. No CVA tenderness.  Resp: diminished breath sounds anterior - bilateral and bibasilar  Chest wall: no tenderness  Cardio: S1, S2 normal  GI: soft, non-tender; bowel sounds normal; no masses, no organomegaly  Male genitalia: normal  Extremities: extremities normal, atraumatic, no cyanosis or edema  Neurologic: Mental status:confused, grip 4-5/5 bilaterally, PERRLA   Imaging: Ct Head Wo Contrast  06/17/2014   CLINICAL DATA:  Altered mental status  EXAM: CT HEAD WITHOUT CONTRAST  TECHNIQUE: Contiguous axial images were obtained from the base of the skull through the vertex without intravenous contrast.  COMPARISON:  10/09/2012  FINDINGS: There is no evidence of mass effect, midline shift, or extra-axial fluid collections. There is no evidence of a  space-occupying lesion or intracranial hemorrhage. There is no evidence of a cortical-based area of acute infarction. There is generalized cerebral atrophy. There is periventricular white matter low attenuation likely secondary to microangiopathy.  The ventricles and sulci are appropriate for the patient's age. The basal cisterns are patent.  Visualized portions of the orbits are unremarkable. Small left maxillary sinus air-fluid level. Remainder of the paranasal sinuses are clear.  Mastoid sinuses are clear. Cerebrovascular atherosclerotic calcifications are noted. Right cavernous carotid artery stent is present.  The osseous structures are unremarkable.  IMPRESSION: No acute intracranial pathology.   Electronically Signed   By: Kathreen Devoid   On: 06/17/2014 08:51    Labs: BMET  Recent Labs Lab 06/11/14 2725  06/12/14 3664  06/15/14 0845 06/15/14 1215 06/15/14 1930 06/16/14 0042 06/16/14 0043 06/16/14 1402 06/17/14 0405  NA 144  < > 147  < > 162* 161* 158* 160* 161* 155* 150*  K 4.1  < > 3.6*  < > 3.8 3.6* 3.6* 3.5* 3.6* 3.5* 3.1*  CL 93*  < > 94*  < > 117* 119* 117* 120* 120* 116* 114*  CO2 29  < > 31  < > 19 17* 18* 20 20 21 20   GLUCOSE 175*  < > 146*  < > 126* 156* 201* 170* 173* 150* 158*  BUN 100*  < > 110*  < > 171* 172* 163* 153* 156* 130* 105*  CREATININE 1.18  < > 1.14  < > 4.38* 4.23* 3.87* 3.70* 3.76* 3.01* 2.43*  CALCIUM 10.6*  < > 11.0*  < > 9.4 9.5 9.4 9.6 9.5 9.5 9.2  PHOS 6.4*  --  5.5*  --   --   --   --   --   --   --   --   < > = values in this interval not displayed. CBC  Recent Labs Lab 06/12/14 0311 06/13/14 0220 06/14/14 0350 06/15/14 0546  WBC 13.7* 16.5* 15.5* 13.4*  HGB 13.5 12.7* 11.9* 10.8*  HCT 42.0 39.1 38.1* 34.4*  MCV 90.7 89.7 90.3 89.4  PLT 699* 791* 581* 500*    Medications:    . antiseptic oral rinse  15 mL Mouth Rinse QID  . aspirin  81 mg Oral Daily  . atorvastatin  80 mg Oral q1800  . chlorhexidine  15 mL Mouth Rinse BID  . clopidogrel  75 mg Oral Daily  . folic acid  1 mg Oral Daily  . heparin subcutaneous  5,000 Units Subcutaneous 3 times per day  . insulin aspart  0-15 Units Subcutaneous TID WC  . insulin aspart  0-5 Units Subcutaneous QHS  . ipratropium-albuterol  3 mL Nebulization BID  . metoprolol tartrate  25 mg Oral TID  . nicotine  21 mg Transdermal Daily  . potassium chloride  10 mEq Intravenous Q1 Hr x 2  . risperiDONE  0.5 mg Oral q morning - 10a  . risperiDONE  1 mg Oral QHS  . sodium  chloride  3 mL Intravenous Q12H  . thiamine  100 mg Oral Daily      Otelia Santee, MD 06/17/2014, 11:44 AM

## 2014-06-17 NOTE — Progress Notes (Signed)
SLP Cancellation Note  Patient Details Name: Roy [name] MRN: [id number] DOB: [birth year]   Cancelled treatment:       Reason Eval/Treat Not Completed: Patient at procedure or test/unavailable. Pt not in room.    Tranika Scholler, Katherene Ponto 06/17/2014, 8:56 AM

## 2014-06-17 NOTE — Progress Notes (Signed)
Patient ID: Roy [name]  male  JQB:[id number]    DOB: [birth year]    DOA: 06/03/2014  PCP: [name], MD  BRIEF PATIENT DESCRIPTION: 67 y.o. active smoker (120 pack years) with COPD admitted 7/9 with pneumonia and hypoxemic respiratory failure. Initially with elevated troponins (peaked at 4.84) but EKG neg acute MI, CXR with pulm edema and ultimately Found to have severe systolic dysfunction with an EF of 20% with global hypokinesis. He was intubated and treated for AECOPD +/- PNA with IV steroids, empiric IV abx for CAP, Nebulized BDs, diuresis and cardiology was consulted. He was successfully extubated 7/17 and he was continued on BD's and aggressive pulm hygiene. He improved with abx and diuresis and and cardiology followed closely for NSTEMI for medical management with asa, plavix, B blocker, statin. Initial cardiology plan was for cardiac cath but course was c/b ETOH withdrawal and metabolic encephalopathy and cath was deferred in setting confusion. At this time he is overall much improved, remains moderately confused but improving. Passed swallow eval with only mild aspiration and is ok for PO diet per speech therapy. He needs continued rehab efforts and plan is for d/c to SNF [facility]) when medically ready.     Assessment/Plan:   Acute respiratory failure - resolved: multifactorial likely due to acute CHF, community acquired pneumonia, COPD exacerbation - O2 sats 99% on room air   Acute encephalopathy:  - Possibly multifactorial due to metabolic encephalopathy from hypernatremia, uremia, alcohol withdrawal - Continue correcting metabolic abnormalities, hypernatremia, acute kidney injury -d/c ATC ativan -head CT    Tobacco abuse - Continue nicotine patch    Alcohol withdrawal delirium - required Precedex drip and BZD's in the ICU    Pneumonia, organism unspecified - Has completed  course of Zithromax and Rocephin    NSTEMI (non-ST elevated myocardial infarction) with acute  systolic CHF - EF 40%, possibly ischemic, cardiology following, recommended medical management at this time, not a candidate for cardiac cath given his renal failure - Not a candidate for ACE/ARB due to acute renal insufficiency    COPD exacerbation - Stable, now improving     Hypernatremia With Acute renal :   - Nephrology following, will defer to renal for management  Diarrhea: C. difficile negative, improving  Hypokalemia -replete      Code Status: full  Family Communication:  Disposition: SNF when medically ready   Consultants:  Cardiology   Nephrology   Procedures:  None   Antibiotics:     Subjective: Not speaking Has tremors  Objective: Weight change: -0.42 kg (-14.8 oz)  Intake/Output Summary (Last 24 hours) at 06/17/14 0755 Last data filed at 06/17/14 0659  Gross per 24 hour  Intake   2190 ml  Output   1000 ml  Net   1190 ml   Blood pressure 128/72, pulse 72, temperature 97.3 F (36.3 C), temperature source Oral, resp. rate 22, height 5\' 6"  (1.676 m), weight 59.78 kg (131 lb 12.7 oz), SpO2 98.00%.  Physical Exam: General: Very frail, tremulous, awake, doesn't follow commands  CVS: S1-S2 clear, no murmur rubs or gallops Chest: clear to auscultation bilaterally, no wheezing, rales or rhonchi Abdomen: soft nontender, nondistended, normal bowel sounds  Extremities: no cyanosis, clubbing or edema noted bilaterally   Lab Results: Basic Metabolic Panel:  Recent Labs Lab 06/12/14 0311  06/14/14 0350  06/16/14 1402 06/17/14 0405  NA 147  < > 156*  < > 155* 150*  K 3.6*  < > 3.8  < >  3.5* 3.1*  CL 94*  < > 107  < > 116* 114*  CO2 31  < > 22  < > 21 20  GLUCOSE 146*  < > 152*  < > 150* 158*  BUN 110*  < > 177*  < > 130* 105*  CREATININE 1.14  < > 4.60*  < > 3.01* 2.43*  CALCIUM 11.0*  < > 9.9  < > 9.5 9.2  MG 3.3*  --  3.6*  --   --   --   PHOS 5.5*  --   --   --   --   --   < > = values in this interval not displayed. Liver Function  Tests: No results found for this basename: AST, ALT, ALKPHOS, BILITOT, PROT, ALBUMIN,  in the last 168 hours No results found for this basename: LIPASE, AMYLASE,  in the last 168 hours  Recent Labs Lab 06/16/14 0041  AMMONIA 28   CBC:  Recent Labs Lab 06/14/14 0350 06/15/14 0546  WBC 15.5* 13.4*  HGB 11.9* 10.8*  HCT 38.1* 34.4*  MCV 90.3 89.4  PLT 581* 500*   Cardiac Enzymes: No results found for this basename: CKTOTAL, CKMB, CKMBINDEX, TROPONINI,  in the last 168 hours BNP: No components found with this basename: POCBNP,  CBG:  Recent Labs Lab 06/16/14 0651 06/16/14 1150 06/16/14 1606 06/16/14 2107 06/17/14 0527  GLUCAP 163* 164* 138* 143* 159*     Micro Results: Recent Results (from the past 240 hour(s))  STOOL CULTURE     Status: None   Collection Time    06/15/14  7:24 AM      Result Value Ref Range Status   Specimen Description STOOL   Final   Special Requests Normal   Final   Culture     Final   Value: Culture reincubated for better growth     Performed at [facility]   Report Status PENDING   Incomplete  CLOSTRIDIUM DIFFICILE BY PCR     Status: None   Collection Time    06/15/14  7:24 AM      Result Value Ref Range Status   C difficile by pcr NEGATIVE  NEGATIVE Final    Studies/Results: Dg Chest Port 1 View  06/11/2014   CLINICAL DATA:  Respiratory distress.  Intubated patient.  EXAM: PORTABLE CHEST - 1 VIEW  COMPARISON:  Single view of the chest 06/10/2014 and 06/09/2014.  FINDINGS: ET tube remains in place and projects in good position. NG tube is seen with the side port at the gastroesophageal junction. Recommend advancement of 4-5 cm. Lungs are clear. Heart size is normal. No pneumothorax or pleural effusion. Right shoulder replacement noted.  IMPRESSION: Side port of the NG tube is at the gastroesophageal junction. Recommend advancement 4-5 cm.  ET tube in good position.  Lungs clear.   Electronically Signed   By: Inge Rise M.D.    On: 06/11/2014 08:00   Dg Chest Port 1 View  06/10/2014   CLINICAL DATA:  Assess endotracheal tube  EXAM: PORTABLE CHEST - 1 VIEW  COMPARISON:  06/09/2014  FINDINGS: There is an endotracheal to which ends between the clavicles and carina. An orogastric tube enters the stomach.  Normal heart size and mediastinal contours for technique.  Improved basilar lung aeration, with the left diaphragm now visible. No effusion or pneumothorax.  IMPRESSION: 1. Well-positioned endotracheal and orogastric tubes. 2. Continued improvement in basilar lung aeration.   Electronically Signed   By:  Jorje Guild M.D.   On: 06/10/2014 06:44   Dg Chest Port 1 View  06/09/2014   CLINICAL DATA:  Respiratory failure.  EXAM: PORTABLE CHEST - 1 VIEW  COMPARISON:  06/08/2014  FINDINGS: Endotracheal tube tip is approximately 4 cm above the carina. Lungs show improved aeration at the left base with mild residual atelectasis remaining. There is no evidence of pulmonary edema, consolidation, pneumothorax, nodule or pleural fluid. The heart size and mediastinal contours are within normal limits. Nasogastric tube continues to extend below the diaphragm.  IMPRESSION: Improved aeration at the left lung base.   Electronically Signed   By: Aletta Edouard M.D.   On: 06/09/2014 07:44   Dg Chest Port 1 View  06/08/2014   CLINICAL DATA:  COPD.  Respiratory failure.  EXAM: PORTABLE CHEST - 1 VIEW  COMPARISON:  Single view of the chest 06/07/2014 and 06/05/2014.  FINDINGS: Support tubes and lines are unchanged. No pneumothorax is identified. Interstitial edema seen on yesterday's examination continues to improve. There is some new subsegmental atelectasis in the left lung base. Heart size is normal.  IMPRESSION: Continued improvement in interstitial pulmonary edema.  Increased subsegmental atelectasis left lung base.   Electronically Signed   By: Inge Rise M.D.   On: 06/08/2014 08:08   Dg Chest Port 1 View  06/07/2014   CLINICAL DATA:   Endotracheal tube.  EXAM: PORTABLE CHEST - 1 VIEW  COMPARISON:  06/05/2014.  FINDINGS: Endotracheal tube ends at the level of the mid thoracic trachea. A new orogastric tube enters the stomach at least. Interval decrease in diffuse interstitial and airspace opacities. No evidence of pneumothorax or increasing pleural fluid. Right glenohumeral arthroplasty noted.  IMPRESSION: 1. Endotracheal and orogastric tubes are in good position. 2. Decreasing pulmonary edema.   Electronically Signed   By: Jorje Guild M.D.   On: 06/07/2014 06:49   Portable Chest Xray  06/06/2014   CLINICAL DATA:  Endotracheal tube placement.  EXAM: PORTABLE CHEST - 1 VIEW  COMPARISON:  06/05/2014  FINDINGS: Interval placement of an endotracheal tube with tip measuring 4.3 cm above the carinal. Shallow inspiration. Mild cardiac enlargement with diffuse bilateral pulmonary infiltration, likely edema. Postoperative change in the right shoulder. No blunting of costophrenic angles. No pneumothorax.  IMPRESSION: Endotracheal tube tip measures 4.3 cm above the carina. Diffuse bilateral pulmonary parenchymal infiltrates again demonstrated.   Electronically Signed   By: Lucienne Capers M.D.   On: 06/06/2014 00:29   Dg Chest Port 1 View  06/05/2014   CLINICAL DATA:  Pulmonary edema.  EXAM: PORTABLE CHEST - 1 VIEW  COMPARISON:  06/03/2014  FINDINGS: Bilateral pulmonary edema has increased diffusely on the left and in the right upper lobe. Edema at the right base appears slightly improved.  Small right effusion. Heart size is within normal limits. No acute osseous abnormality.  IMPRESSION: Pulmonary edema has slightly increased.   Electronically Signed   By: Rozetta Nunnery M.D.   On: 06/05/2014 08:15   Dg Chest Portable 1 View  06/03/2014   CLINICAL DATA:  Respiratory distress.  EXAM: PORTABLE CHEST - 1 VIEW  COMPARISON:  Chest x-ray 09/25/2010 .  FINDINGS: Cardiomegaly with pulmonary vascular prominence and interstitial prominence present  consistent we congestive heart failure and pulmonary edema. No focal alveolar infiltrate. No pleural effusion or pneumothorax. Right shoulder replacement.  IMPRESSION: Congestive heart failure with pulmonary interstitial edema.   Electronically Signed   By: Marcello Moores  Register   On: 06/03/2014 10:09   Dg  Abd Portable 1v  06/06/2014   CLINICAL DATA:  OG tube placement  EXAM: PORTABLE ABDOMEN - 1 VIEW  COMPARISON:  03/19/2014; 10/09/2012; CTA - 02/17/2014  FINDINGS: Interval placement of enteric tube with tip and side port overlying the expected location of the mid body of the stomach.  There is moderate gas distention of the colon without definitive gas distention of the upstream small bowel. No supine evidence of pneumoperitoneum. No definite pneumatosis or portal venous gas.  There has been interval passage of previously ingested endoscopy capsule.  Vascular calcifications.  IMPRESSION: 1. Enteric tube tip and side port projects over the mid body of the stomach. 2. Nonspecific mild gas distention of the colon could be indicative of mild ileus.   Electronically Signed   By: Sandi Mariscal M.D.   On: 06/06/2014 13:37   Dg Swallowing Func-speech Pathology  06/14/2014   Katherene Ponto Deblois, CCC-SLP     06/14/2014 10:32 AM Objective Swallowing Evaluation: Modified Barium Swallowing Study   Patient Details  Name: SEVYN PAREDEZ MRN: [id number] Date of Birth: 16-Sep-1946  Today's Date: 06/14/2014 Time: 0950-1015 SLP Time Calculation (min): 25 min  Past Medical History:  Past Medical History  Diagnosis Date  . Cerebrovascular disease 2009    TIA; 2009- right ICA stent; re-intervention for restenosis  complicated by [facility] w/o sx  . Degenerative joint disease     Total shoulder arthroplasty-right  . LBBB (left bundle branch block)     Normal echo-2011; stress nuclear in 09/2010--septal  hypoperfusion representing nontransmural infarction or the effect  of left bundle branch block, no ischemia  . Hyperlipidemia   . Hypertension    . Chronic obstructive pulmonary disease   . Tobacco abuse     -100 pack years; 1.5 packs per day  . Anxiety and depression   . Alcohol abuse     6 beers per day; hospital admission in 2009 for withdrawal  symptoms  . Thrombocytopenia   . Tubular adenoma of colon    Past Surgical History:  Past Surgical History  Procedure Laterality Date  . Lumbar fusion  2010  . Vasectomy  1971  . Colonoscopy w/ polypectomy  2011  . Total shoulder arthroplasty  2011    Right-Dr. Tamera Punt  . Colonoscopy  08/22/09    Fields-(Tubular Adenoma)3-mm transverse polyp/4-mm polyp  otherwise noraml/small internal hemorrhoids  . Esophagogastroduodenoscopy [name] 03/05/2014    Procedure: ESOPHAGOGASTRODUODENOSCOPY (EGD);  Surgeon: Danie Binder, MD;  [facility]: [facility];  Service: Endoscopy;   Laterality: [name];  11:45  . Agile capsule [name] 03/18/2014    Procedure: AGILE CAPSULE;  Surgeon: Danie Binder, MD;   Location: [facility];  Service: Endoscopy;  Laterality: [name];   7:30  . Givens capsule study [name] 03/30/2014    Procedure: GIVENS CAPSULE STUDY;  Surgeon: Danie Binder, MD;   Location: [facility];  Service: Endoscopy;  Laterality: [name];   7:30  . Colonoscopy [name] 04/14/2014    Procedure: COLONOSCOPY;  Surgeon: Danie Binder, MD;   Location: [facility];  Service: Endoscopy;  Laterality: [name];   9:91   HPI:  68 year old male, current smoker with PMH of COPD, alcohol abuse,  chronic bronchitis, TIA, HTN, LBBB who presented to [facility] ED via  EMS 7/9 for progressively worsening SOB and respiratory distress.  Transferred to [facility].  Intubated 7/10-7/17. NSTEMI     Assessment / Plan / Recommendation Clinical Impression  Dysphagia Diagnosis: Moderate oral phase dysphagia;Mild  pharyngeal phase  dysphagia Clinical impression: Pt presents with a moderate oral dysphagia  impacting safety with PO. Pt is moderately confused, edentulous  and tremulous with very dry oral mucosa. With most boluses, pt  will briefly hold bolus orally with occasional lingual  tremors  prior to active transit. Intermittently pt has premature spillage  of partial bolus to pyriforms and sometimes pt actively transits  bolus with a delayed swallow initiation and aspiration before the  swallow. Of note, pt often clears throat post swallow, unrelated  to penetration or aspiration. Initially aspiration event was  silent, but then followed by late, hard, effective cough that  expelled thin aspirate. In some trials swallow was timely,  suggestive of a primary cognitive problem rather than a true  sensory deficit. Strength is WNL and trace residuals coating all  oropahryngeal structures likely due to dry mucosa. Recommend  nectar thick liquids as pt orally manages this bolus best and has  decreased risk of aspiration before the swallow. Will also start  pt with a puree diet, though expect as mentation improves he may  tolerate soft solids and thin liquids well.     Treatment Recommendation  Therapy as outlined in treatment plan below    Diet Recommendation Dysphagia 1 (Puree);Nectar-thick liquid   Liquid Administration via: Cup;Straw Medication Administration: Crushed with puree Supervision: Staff to assist with self feeding Compensations: Slow rate;Small sips/bites Postural Changes and/or Swallow Maneuvers: Seated upright 90  degrees;Upright 30-60 min after meal    Other  Recommendations Oral Care Recommendations: Oral care BID Other Recommendations: Order thickener from pharmacy   Follow Up Recommendations  Skilled Nursing facility    Frequency and Duration min 2x/week  2 weeks   Pertinent Vitals/Pain NA    SLP Swallow Goals     General Date of Onset: 06/03/14 HPI: 68 year old male, current smoker with PMH of COPD, alcohol  abuse, chronic bronchitis, TIA, HTN, LBBB who presented to [facility] ED  via EMS 7/9 for progressively worsening SOB and respiratory  distress. Transferred to [facility].  Intubated 7/10-7/17. NSTEMI Type of Study: Modified Barium Swallowing Study Reason for Referral: Objectively  evaluate swallowing function Previous Swallow Assessment: none per records Diet Prior to this Study: NPO Temperature Spikes Noted: No Respiratory Status: Room air History of Recent Intubation: Yes Length of Intubations (days): 7 days Date extubated: 06/11/14 Behavior/Cognition: Alert;Cooperative;Decreased sustained  attention;Requires cueing;Distractible Oral Cavity - Dentition: Edentulous Oral Motor / Sensory Function: Within functional limits Self-Feeding Abilities: Needs assist Patient Positioning: Upright in chair Baseline Vocal Quality: Breathy;Hoarse;Low vocal intensity Volitional Cough: Strong Volitional Swallow: Able to elicit Anatomy:  (dry oral mucosa) Pharyngeal Secretions: Not observed secondary MBS    Reason for Referral Objectively evaluate swallowing function   Oral Phase Oral Preparation/Oral Phase Oral Phase: Impaired Oral - Nectar Oral - Nectar Cup: Lingual/palatal residue;Delayed oral  transit;Reduced posterior propulsion;Holding of bolus;Lingual  pumping Oral - Nectar Straw: Lingual/palatal residue;Delayed oral  transit;Reduced posterior propulsion;Holding of bolus;Lingual  pumping Oral - Thin Oral - Thin Cup: Lingual/palatal residue;Delayed oral  transit;Reduced posterior propulsion;Holding of bolus;Lingual  pumping Oral - Thin Straw: Lingual/palatal residue;Delayed oral  transit;Reduced posterior propulsion;Holding of bolus;Lingual  pumping Oral - Solids Oral - Puree: Lingual/palatal residue;Delayed oral  transit;Reduced posterior propulsion;Holding of bolus;Lingual  pumping Oral - Mechanical Soft: Lingual/palatal residue;Delayed oral  transit;Reduced posterior propulsion;Holding of bolus;Lingual  pumping;Piecemeal swallowing Oral - Pill: Lingual/palatal residue;Delayed oral transit;Reduced  posterior propulsion;Holding of bolus;Lingual pumping   Pharyngeal Phase Pharyngeal Phase Pharyngeal Phase: Impaired Pharyngeal - Nectar Pharyngeal - Nectar Cup:  Delayed swallow initiation;Premature   spillage to valleculae Pharyngeal - Nectar Straw: Delayed swallow initiation;Premature  spillage to valleculae Pharyngeal - Thin Pharyngeal - Thin Cup: Delayed swallow initiation;Premature  spillage to pyriform sinuses Pharyngeal - Thin Straw: Delayed swallow initiation;Premature  spillage to pyriform sinuses;Penetration/Aspiration before  swallow;Moderate aspiration Penetration/Aspiration details (thin straw): Material enters  airway, passes BELOW cords without attempt by patient to eject  out (silent aspiration) Pharyngeal - Solids Pharyngeal - Puree: Delayed swallow initiation Pharyngeal - Mechanical Soft: Delayed swallow  initiation;Premature spillage to pyriform sinuses  Cervical Esophageal Phase    GO    Cervical Esophageal Phase Cervical Esophageal Phase: [facility]        Herbie Baltimore, MA CCC-SLP [phone]  DeBlois, Katherene Ponto 06/14/2014, 10:27 AM     Medications: Scheduled Meds: . antiseptic oral rinse  15 mL Mouth Rinse QID  . aspirin  81 mg Oral Daily  . atorvastatin  80 mg Oral q1800  . chlorhexidine  15 mL Mouth Rinse BID  . clopidogrel  75 mg Oral Daily  . folic acid  1 mg Oral Daily  . heparin subcutaneous  5,000 Units Subcutaneous 3 times per day  . insulin aspart  0-15 Units Subcutaneous TID WC  . insulin aspart  0-5 Units Subcutaneous QHS  . ipratropium-albuterol  3 mL Nebulization BID  . LORazepam  1 mg Intravenous TID  . metoprolol tartrate  25 mg Oral TID  . nicotine  21 mg Transdermal Daily  . risperiDONE  0.5 mg Oral q morning - 10a  . risperiDONE  1 mg Oral QHS  . sodium chloride  3 mL Intravenous Q12H  . thiamine  100 mg Oral Daily      LOS: 14 days   Youcef Klas DO [facility] 06/17/2014, 7:55 AM Pager: [phone]  If 7PM-7AM, please contact night-coverage [email or web address] Password [email or web address]  **Disclaimer: This note was dictated with voice recognition software. Similar sounding words can inadvertently be transcribed and this note may contain transcription  errors which may not have been corrected upon publication of note.**

## 2014-06-17 NOTE — Progress Notes (Signed)
Alert and responsive. Pt is oriented to self. He nods to answer question. Not speaking much verbally. Pt is more responsive since scheduled ativan had been discontinued. Denies pain and discomfort.

## 2014-06-17 NOTE — Progress Notes (Signed)
Patient: Roy [name] / Admit Date: 06/03/2014 / Date of Encounter: 06/17/2014, 9:19 AM   Subjective: Pt is arousable but does not respond to questions.  He is under multiple blankets and shivers when the covers are drawn back for examination. Appears to be resting and breathing comfortably. Some guarding on abdominal exam. Otherwise difficult to examine.   Objective: Telemetry: Sinus with chronic LBBB and freq PVCs with possible bigeminy   Physical Exam: Blood pressure 128/72, pulse 72, temperature 97.3 F (36.3 C), temperature source Oral, resp. rate 22, height 5\' 6"  (1.676 m), weight 131 lb 12.7 oz (59.78 kg), SpO2 98.00%. General: Well developed, cachectic, sleeping in bed.  Arousable but quickly returns to sleep. Head: Normocephalic, atraumatic, sclera non-icteric, no xanthomas, nares are without discharge. Neck: Negative for carotid bruits. JVP not elevated. Lungs: Decreased breath sounds without wheezes, rales, or rhonchi. Breathing is unlabored. Heart: very distant heart sounds. RRR S1 S2 without murmurs, rubs, or gallops.  Abdomen: Soft, possible tenderness with guarding, non-distended with normoactive bowel sounds. Extremities: No clubbing or cyanosis. No edema. Distal pedal pulses are 2+ and equal bilaterally. Neuro: laying in bed.  Moves extremities spontaneously  Psych:  Does not speak or respond to questions.   Intake/Output Summary (Last 24 hours) at 06/17/14 0919 Last data filed at 06/17/14 0825  Gross per 24 hour  Intake   2070 ml  Output   1250 ml  Net    820 ml    Inpatient Medications:  . antiseptic oral rinse  15 mL Mouth Rinse QID  . aspirin  81 mg Oral Daily  . atorvastatin  80 mg Oral q1800  . chlorhexidine  15 mL Mouth Rinse BID  . clopidogrel  75 mg Oral Daily  . folic acid  1 mg Oral Daily  . heparin subcutaneous  5,000 Units Subcutaneous 3 times per day  . insulin aspart  0-15 Units Subcutaneous TID WC  . insulin aspart  0-5 Units Subcutaneous QHS   . ipratropium-albuterol  3 mL Nebulization BID  . metoprolol tartrate  25 mg Oral TID  . nicotine  21 mg Transdermal Daily  . potassium chloride  10 mEq Intravenous Q1 Hr x 2  . risperiDONE  0.5 mg Oral q morning - 10a  . risperiDONE  1 mg Oral QHS  . sodium chloride  3 mL Intravenous Q12H  . thiamine  100 mg Oral Daily   Infusions:  . dextrose 150 mL/hr at 06/17/14 0339    Labs:  Recent Labs  06/16/14 1402 06/17/14 0405  NA 155* 150*  K 3.5* 3.1*  CL 116* 114*  CO2 21 20  GLUCOSE 150* 158*  BUN 130* 105*  CREATININE 3.01* 2.43*  CALCIUM 9.5 9.2    Recent Labs  06/15/14 0546  WBC 13.4*  HGB 10.8*  HCT 34.4*  MCV 89.4  PLT 500*    Radiology/Studies:  Ct Head Wo Contrast  06/17/2014   CLINICAL DATA:  Altered mental status  EXAM: CT HEAD WITHOUT CONTRAST  TECHNIQUE: Contiguous axial images were obtained from the base of the skull through the vertex without intravenous contrast.  COMPARISON:  10/09/2012  FINDINGS: There is no evidence of mass effect, midline shift, or extra-axial fluid collections. There is no evidence of a space-occupying lesion or intracranial hemorrhage. There is no evidence of a cortical-based area of acute infarction. There is generalized cerebral atrophy. There is periventricular white matter low attenuation likely secondary to microangiopathy.  The ventricles and sulci are appropriate for  the patient's age. The basal cisterns are patent.  Visualized portions of the orbits are unremarkable. Small left maxillary sinus air-fluid level. Remainder of the paranasal sinuses are clear. Mastoid sinuses are clear. Cerebrovascular atherosclerotic calcifications are noted. Right cavernous carotid artery stent is present.  The osseous structures are unremarkable.  IMPRESSION: No acute intracranial pathology.   Electronically Signed   By: Kathreen Devoid   On: 06/17/2014 08:51   Dg Chest Port 1 View  06/11/2014   CLINICAL DATA:  Respiratory distress.  Intubated  patient.  EXAM: PORTABLE CHEST - 1 VIEW  COMPARISON:  Single view of the chest 06/10/2014 and 06/09/2014.  FINDINGS: ET tube remains in place and projects in good position. NG tube is seen with the side port at the gastroesophageal junction. Recommend advancement of 4-5 cm. Lungs are clear. Heart size is normal. No pneumothorax or pleural effusion. Right shoulder replacement noted.  IMPRESSION: Side port of the NG tube is at the gastroesophageal junction. Recommend advancement 4-5 cm.  ET tube in good position.  Lungs clear.   Electronically Signed   By: Inge Rise M.D.   On: 06/11/2014 08:00      Assessment and Plan  Roy [name] is a 68 y.o. male with a history of active smoker (120 pack years) with COPD, alcoholism, CVD with intracerebral stenting who was admitted 06/03/14 with pneumonia and hypoxemic respiratory failure and found to have NSTEMI.   NSTEMI: Medical management only, due to renal failure and altered mental status.  - would not consider cath unless mental status improves. - On aspirin, metoprolol, plavix and statin  Acute systolic heart failure: Ejection fraction 20%, presumably ischemic. Sill volume depleted and prerenal.  - holding ACE/ARB due to renal function   Acute respiratory failure: secondary to acute CHF, CAP, COPD exacerbation with encephalopathy. Improved and followed by IM.   Hx of ETOH abuse    CV disease: s/p intracranial stent.  - on plavix   PAD   Acute renal failure: improving Cr/BUN 3.01/130 yesterday --> 2.43/105 today -  followed by nephrology     Signed, Anson Crofts PA-S  Signed, Melina Copa PA-C Patient seen and examined and history reviewed. Agree with above findings and plan. Exam is stable. History is unobtainable. Renal function improved. Does not appear volume overloaded. Hypernatremia improving. Continue current therapy.  Peter Martinique, [place] 06/17/2014 12:32 PM

## 2014-06-18 LAB — GLUCOSE, CAPILLARY
Glucose-Capillary: 143 mg/dL — ABNORMAL HIGH (ref 70–99)
Glucose-Capillary: 134 mg/dL — ABNORMAL HIGH (ref 70–99)
Glucose-Capillary: 100 mg/dL — ABNORMAL HIGH (ref 70–99)
Glucose-Capillary: 161 mg/dL — ABNORMAL HIGH (ref 70–99)

## 2014-06-18 LAB — BASIC METABOLIC PANEL
ANION GAP: 17 — AB (ref 5–15)
BUN: 71 mg/dL — ABNORMAL HIGH (ref 6–23)
CALCIUM: 9.2 mg/dL (ref 8.4–10.5)
CO2: 21 mEq/L (ref 19–32)
CREATININE: 1.83 mg/dL — AB (ref 0.50–1.35)
Chloride: 111 mEq/L (ref 96–112)
GFR calc non Af Amer: 37 mL/min — ABNORMAL LOW (ref >90)
GFR, EST AFRICAN AMERICAN: 42 mL/min — AB (ref >90)
Glucose, Bld: 142 mg/dL — ABNORMAL HIGH (ref 70–99)
Potassium: 3.2 mEq/L — ABNORMAL LOW (ref 3.7–5.3)
Sodium: 149 mEq/L — ABNORMAL HIGH (ref 137–147)

## 2014-06-18 LAB — CBC
HCT: 34.2 % — ABNORMAL LOW (ref 39.0–52.0)
Hemoglobin: 10.9 g/dL — ABNORMAL LOW (ref 13.0–17.0)
MCH: 28.2 pg (ref 26.0–34.0)
MCHC: 31.9 g/dL (ref 30.0–36.0)
MCV: 88.6 fL (ref 78.0–100.0)
PLATELETS: 358 10*3/uL (ref 150–400)
RBC: 3.86 MIL/uL — ABNORMAL LOW (ref 4.22–5.81)
RDW: 15.5 % (ref 11.5–15.5)
WBC: 9.4 10*3/uL (ref 4.0–10.5)

## 2014-06-18 MED ORDER — DEXTROSE 5 % IV SOLN
INTRAVENOUS | Status: DC
  Administered 2014-06-19 – 2014-06-20 (×5): via INTRAVENOUS
  Administered 2014-06-21: 50 mL via INTRAVENOUS
  Administered 2014-06-21 – 2014-06-22 (×2): via INTRAVENOUS

## 2014-06-18 MED ORDER — POTASSIUM CHLORIDE 10 MEQ/100ML IV SOLN
10.0000 meq | INTRAVENOUS | Status: AC
  Administered 2014-06-18 (×2): 10 meq via INTRAVENOUS
  Filled 2014-06-18 (×2): qty 100

## 2014-06-18 MED ORDER — ADULT MULTIVITAMIN W/MINERALS CH
1.0000 | ORAL_TABLET | Freq: Every day | ORAL | Status: DC
  Administered 2014-06-18 – 2014-06-22 (×5): 1 via ORAL
  Filled 2014-06-18 (×5): qty 1

## 2014-06-18 MED ORDER — DEXTROSE 5 % IV SOLN
INTRAVENOUS | Status: AC
  Administered 2014-06-18 (×2): 150 mL via INTRAVENOUS

## 2014-06-18 MED ORDER — ENSURE PUDDING PO PUDG
1.0000 | Freq: Two times a day (BID) | ORAL | Status: DC
  Administered 2014-06-18 – 2014-06-22 (×8): 1 via ORAL

## 2014-06-18 NOTE — Progress Notes (Signed)
NUTRITION FOLLOW-UP  DOCUMENTATION CODES Per approved criteria  -Not Applicable   INTERVENTION: Provide Magic Cup BID with meals and Ensure Pudding BID between meals Provide Multivitamin with minerals daily RD to continue to follow nutrition care plan.  NUTRITION DIAGNOSIS: Inadequate oral intake related to inability to eat as evidenced by npo status. Ongoing- diet advanced but PO intake remains inadequate  Goal: Intake to meet >90% of estimated nutrition needs. Unmet.  Monitor:  weight trends, lab trends, I/O's, ventilator use, tolerance of nutrition support  ASSESSMENT: PMHx significant for COPD, ETOH abuse, chronic bronchitis, TIA, HTN, LBBB. Admitted with progressively worsening SOB and respiratory distress. Work-up reveals acute hypoxemic respiratory failure.  Per chart, pt smokes 2-3 packs of cigarettes daily and drinks a case of beer daily.  Intubated 7/12 after pt developed acute hypoxemic respiratory failure, severe agitation and hallucinations. Extubated on 7/17.   7/21: Diet was advanced on 7/20 to Dysphagia 1 with nectar-thick liquids. Per nursing notes pt ate 25% of one meal; pt was made NPO again today.  Pt's weight has trended down 21 lbs since admission with 2% weight loss in the past 5 days. Pt attemped to answer RD questions at time of visit but, his voice was raspy and speech was unintelligible.   7/24: Diet advanced back to Dysphagia 1 on 7/21. Per nursing notes at [facility] times pt is eating a few bits to 50%. His weight has trended up 1 lb in the past 3 days. Per MD note, pt remains confused; arousable but does not respond to questions.  CBG's: 125 - 173 Sodium elevated at 149 Potassium low High BUN of 71 (trending down) High Creatinine (trending down) Low GFR (trending up) Magnesium elevated at 3.6 Low hemoglobin   Height: Ht Readings from Last 1 Encounters:  06/04/14 5\' 6"  (1.676 m)    Weight: Wt Readings from Last 1 Encounters:  06/18/14 133 lb  13.1 oz (60.7 kg)  Admit wt 153 lb  BMI:  Body mass index is 21.61 kg/(m^2). WNL  Estimated Nutritional Needs: Kcal: 1700-1900 Protein: 85-95 grams Fluid: 1.7-1.9 L/day  Skin: intact  Diet Order: Dysphagia 1/ Nectar-thick liquids    Intake/Output Summary (Last 24 hours) at 06/18/14 1015 Last data filed at 06/18/14 1014  Gross per 24 hour  Intake 2331.25 ml  Output   1600 ml  Net 731.25 ml    Last BM: 7/24  Labs:   Recent Labs Lab 06/12/14 0311  06/14/14 0350  06/17/14 1125 06/17/14 1410 06/18/14 0310  NA 147  < > 156*  < > 149* 150* 149*  K 3.6*  < > 3.8  < > 3.5* 3.8 3.2*  CL 94*  < > 107  < > 112 112 111  CO2 31  < > 22  < > 21 22 21   BUN 110*  < > 177*  < > 91* 87* 71*  CREATININE 1.14  < > 4.60*  < > 2.21* 2.18* 1.83*  CALCIUM 11.0*  < > 9.9  < > 9.3 9.2 9.2  MG 3.3*  --  3.6*  --   --   --   --   PHOS 5.5*  --   --   --   --   --   --   GLUCOSE 146*  < > 152*  < > 131* 189* 142*  < > = values in this interval not displayed.  CBG (last 3)   Recent Labs  06/17/14 1608 06/17/14 2113 06/18/14 0622  GLUCAP  170* 143* 143*    Scheduled Meds: . antiseptic oral rinse  15 mL Mouth Rinse QID  . aspirin  81 mg Oral Daily  . atorvastatin  80 mg Oral q1800  . chlorhexidine  15 mL Mouth Rinse BID  . clopidogrel  75 mg Oral Daily  . feeding supplement (ENSURE)  1 Container Oral BID BM  . folic acid  1 mg Oral Daily  . heparin subcutaneous  5,000 Units Subcutaneous 3 times per day  . insulin aspart  0-15 Units Subcutaneous TID WC  . insulin aspart  0-5 Units Subcutaneous QHS  . metoprolol tartrate  25 mg Oral TID  . nicotine  21 mg Transdermal Daily  . risperiDONE  0.5 mg Oral q morning - 10a  . risperiDONE  1 mg Oral QHS  . sodium chloride  3 mL Intravenous Q12H  . thiamine  100 mg Oral Daily    Continuous Infusions: . dextrose      Pryor Ochoa RD, LDN Inpatient Clinical Dietitian Pager: [phone] After Hours Pager: [phone]

## 2014-06-18 NOTE — Progress Notes (Signed)
Patient: Roy [name] / Admit Date: 06/03/2014 / Date of Encounter: 06/18/2014, 10:45 AM   Subjective: Pt is awake.  Not oriented. Appears to be resting and breathing comfortably. Not really conversant. Wife at bedside.   Objective: Telemetry: Sinus with chronic LBBB and rare PVCs   Physical Exam: Blood pressure 122/67, pulse 92, temperature 97.5 F (36.4 C), temperature source Oral, resp. rate 20, height 5\' 6"  (1.676 m), weight 133 lb 13.1 oz (60.7 kg), SpO2 97.00%. General: Well developed, cachectic Arousable but quickly returns to sleep. Head: Normocephalic, atraumatic, sclera non-icteric, no xanthomas, nares are without discharge. Neck: Negative for carotid bruits. JVP not elevated. Lungs: Decreased breath sounds without wheezes, rales, or rhonchi. Breathing is unlabored. Heart: very distant heart sounds. RRR S1 S2 without murmurs, rubs, or gallops.  Abdomen: Soft, possible tenderness with guarding, non-distended with normoactive bowel sounds. Extremities: No clubbing or cyanosis. No edema. Distal pedal pulses are 2+ and equal bilaterally. Neuro: laying in bed.  Moves extremities spontaneously  Psych:  Does not speak or respond to questions.   Intake/Output Summary (Last 24 hours) at 06/18/14 1045 Last data filed at 06/18/14 1031  Gross per 24 hour  Intake 2331.25 ml  Output   2100 ml  Net 231.25 ml    Inpatient Medications:  . antiseptic oral rinse  15 mL Mouth Rinse QID  . aspirin  81 mg Oral Daily  . atorvastatin  80 mg Oral q1800  . chlorhexidine  15 mL Mouth Rinse BID  . clopidogrel  75 mg Oral Daily  . feeding supplement (ENSURE)  1 Container Oral BID BM  . folic acid  1 mg Oral Daily  . heparin subcutaneous  5,000 Units Subcutaneous 3 times per day  . insulin aspart  0-15 Units Subcutaneous TID WC  . insulin aspart  0-5 Units Subcutaneous QHS  . metoprolol tartrate  25 mg Oral TID  . multivitamin with minerals  1 tablet Oral Daily  . nicotine  21 mg  Transdermal Daily  . risperiDONE  0.5 mg Oral q morning - 10a  . risperiDONE  1 mg Oral QHS  . sodium chloride  3 mL Intravenous Q12H  . thiamine  100 mg Oral Daily   Infusions:  . dextrose      Labs:  Recent Labs  06/17/14 1410 06/18/14 0310  NA 150* 149*  K 3.8 3.2*  CL 112 111  CO2 22 21  GLUCOSE 189* 142*  BUN 87* 71*  CREATININE 2.18* 1.83*  CALCIUM 9.2 9.2    Recent Labs  06/18/14 0310  WBC 9.4  HGB 10.9*  HCT 34.2*  MCV 88.6  PLT 358    Radiology/Studies:  Ct Head Wo Contrast  06/17/2014   CLINICAL DATA:  Altered mental status  EXAM: CT HEAD WITHOUT CONTRAST  TECHNIQUE: Contiguous axial images were obtained from the base of the skull through the vertex without intravenous contrast.  COMPARISON:  10/09/2012  FINDINGS: There is no evidence of mass effect, midline shift, or extra-axial fluid collections. There is no evidence of a space-occupying lesion or intracranial hemorrhage. There is no evidence of a cortical-based area of acute infarction. There is generalized cerebral atrophy. There is periventricular white matter low attenuation likely secondary to microangiopathy.  The ventricles and sulci are appropriate for the patient's age. The basal cisterns are patent.  Visualized portions of the orbits are unremarkable. Small left maxillary sinus air-fluid level. Remainder of the paranasal sinuses are clear. Mastoid sinuses are clear. Cerebrovascular atherosclerotic calcifications  are noted. Right cavernous carotid artery stent is present.  The osseous structures are unremarkable.  IMPRESSION: No acute intracranial pathology.   Electronically Signed   By: Kathreen Devoid   On: 06/17/2014 08:51   Dg Chest Port 1 View  06/11/2014   CLINICAL DATA:  Respiratory distress.  Intubated patient.  EXAM: PORTABLE CHEST - 1 VIEW  COMPARISON:  Single view of the chest 06/10/2014 and 06/09/2014.  FINDINGS: ET tube remains in place and projects in good position. NG tube is seen with the  side port at the gastroesophageal junction. Recommend advancement of 4-5 cm. Lungs are clear. Heart size is normal. No pneumothorax or pleural effusion. Right shoulder replacement noted.  IMPRESSION: Side port of the NG tube is at the gastroesophageal junction. Recommend advancement 4-5 cm.  ET tube in good position.  Lungs clear.   Electronically Signed   By: Inge Rise M.D.   On: 06/11/2014 08:00      Assessment and Plan  Roy [name] is a 68 y.o. male with a history of active smoker (120 pack years) with COPD, alcoholism, CVD with intracerebral stenting who was admitted 06/03/14 with pneumonia and hypoxemic respiratory failure and found to have NSTEMI.   NSTEMI: Medical management only, due to renal failure and altered mental status.  - would not consider cath unless mental status and renal function improve significantly. - On aspirin, metoprolol, plavix and statin - discussed with wife that we do not plan any further cardiac work up at this time. Will need to arrange follow up in our [place] office as outpatient.   Acute systolic heart failure: Ejection fraction 20%, presumably ischemic.  - holding ACE/ARB/diuretics due to renal function   Acute respiratory failure: secondary to acute CHF, CAP, COPD exacerbation with encephalopathy. Improved and followed by IM.   Hx of ETOH abuse - wife reports patient drinking at least 4 cases of beer a week.    CV disease: s/p intracranial stent.  - on plavix   PAD   Acute renal failure: improving Cr/BUN 3.01/130 yesterday --> 1.83/71 today -  followed by nephrology   Tobacco abuse: Patient smoking 4 PPD according to wife.   We will sign off at this point please call with further questions/concerns.  Latesia Norrington Martin[place], [place] 06/18/2014 10:45 AM

## 2014-06-18 NOTE — Progress Notes (Signed)
Patient ID: Roy [name]  male  ZYS:[id number]    DOB: Sep 26, 1946    DOA: 06/03/2014  PCP: [name], MD  BRIEF PATIENT DESCRIPTION: 68 y.o. active smoker (120 pack years) with COPD admitted 7/9 with pneumonia and hypoxemic respiratory failure. Initially with elevated troponins (peaked at 4.84) but EKG neg acute MI, CXR with pulm edema and ultimately Found to have severe systolic dysfunction with an EF of 20% with global hypokinesis. He was intubated and treated for AECOPD +/- PNA with IV steroids, empiric IV abx for CAP, Nebulized BDs, diuresis and cardiology was consulted. He was successfully extubated 7/17 and he was continued on BD's and aggressive pulm hygiene. He improved with abx and diuresis and and cardiology followed closely for NSTEMI for medical management with asa, plavix, B blocker, statin. Initial cardiology plan was for cardiac cath but course was c/b ETOH withdrawal and metabolic encephalopathy and cath was deferred in setting confusion. At this time he is overall much improved, remains moderately confused but improving. Passed swallow eval with only mild aspiration and is ok for PO diet per speech therapy. He needs continued rehab efforts and plan is for d/c to SNF [facility]) when medically ready.    Assessment/Plan:   Acute respiratory failure - resolved: multifactorial likely due to acute CHF, community acquired pneumonia, COPD exacerbation - O2 sats 99% on room air  Acute encephalopathy:  - Possibly multifactorial due to metabolic encephalopathy from hypernatremia, uremia, alcohol withdrawal - Continue correcting metabolic abnormalities, hypernatremia, acute kidney injury -d/c ATC ativan -head CT ok    Tobacco abuse - Continue nicotine patch    Alcohol withdrawal delirium - required Precedex drip and BZD's in the ICU    Pneumonia, organism unspecified - Has completed  course of Zithromax and Rocephin    NSTEMI (non-ST elevated myocardial infarction) with acute  systolic CHF - EF 93%, possibly ischemic, cardiology following, recommended medical management at this time, not a candidate for cardiac cath given his renal failure - Not a candidate for ACE/ARB due to acute renal insufficiency    COPD exacerbation - Stable, now improving     Hypernatremia With Acute renal :   - Nephrology following, will defer to renal for management  Diarrhea: C. difficile negative, improving  Hypokalemia -replete      Code Status: full  Family Communication: wife at bedside  Disposition: SNF when medically ready   Consultants:  Cardiology   Nephrology   Procedures:  None   Antibiotics:     Subjective: More awake today No complaints  Objective: Weight change: 0.92 kg (2 lb 0.5 oz)  Intake/Output Summary (Last 24 hours) at 06/18/14 1030 Last data filed at 06/18/14 1014  Gross per 24 hour  Intake 2331.25 ml  Output   1600 ml  Net 731.25 ml   Blood pressure 122/67, pulse 92, temperature 97.5 F (36.4 C), temperature source Oral, resp. rate 20, height 5\' 6"  (1.676 m), weight 60.7 kg (133 lb 13.1 oz), SpO2 97.00%.  Physical Exam: General: awake, following commands CVS: S1-S2 clear, no murmur rubs or gallops Chest: clear to auscultation bilaterally, no wheezing, rales or rhonchi Abdomen: soft nontender, nondistended, normal bowel sounds  Extremities: no cyanosis, clubbing or edema noted bilaterally   Lab Results: Basic Metabolic Panel:  Recent Labs Lab 06/12/14 0311  06/14/14 0350  06/17/14 1410 06/18/14 0310  NA 147  < > 156*  < > 150* 149*  K 3.6*  < > 3.8  < > 3.8 3.2*  CL 94*  < > 107  < > 112 111  CO2 31  < > 22  < > 22 21  GLUCOSE 146*  < > 152*  < > 189* 142*  BUN 110*  < > 177*  < > 87* 71*  CREATININE 1.14  < > 4.60*  < > 2.18* 1.83*  CALCIUM 11.0*  < > 9.9  < > 9.2 9.2  MG 3.3*  --  3.6*  --   --   --   PHOS 5.5*  --   --   --   --   --   < > = values in this interval not displayed. Liver Function Tests: No  results found for this basename: AST, ALT, ALKPHOS, BILITOT, PROT, ALBUMIN,  in the last 168 hours No results found for this basename: LIPASE, AMYLASE,  in the last 168 hours  Recent Labs Lab 06/16/14 0041  AMMONIA 28   CBC:  Recent Labs Lab 06/15/14 0546 06/18/14 0310  WBC 13.4* 9.4  HGB 10.8* 10.9*  HCT 34.4* 34.2*  MCV 89.4 88.6  PLT 500* 358   Cardiac Enzymes: No results found for this basename: CKTOTAL, CKMB, CKMBINDEX, TROPONINI,  in the last 168 hours BNP: No components found with this basename: POCBNP,  CBG:  Recent Labs Lab 06/17/14 0527 06/17/14 1125 06/17/14 1608 06/17/14 2113 06/18/14 0622  GLUCAP 159* 125* 170* 143* 143*     Micro Results: Recent Results (from the past 240 hour(s))  STOOL CULTURE     Status: None   Collection Time    06/15/14  7:24 AM      Result Value Ref Range Status   Specimen Description STOOL   Final   Special Requests Normal   Final   Culture     Final   Value: NO SUSPICIOUS COLONIES, CONTINUING TO HOLD     Note: REDUCED NORMAL FLORA PRESENT     Performed at [facility]   Report Status PENDING   Incomplete  CLOSTRIDIUM DIFFICILE BY PCR     Status: None   Collection Time    06/15/14  7:24 AM      Result Value Ref Range Status   C difficile by pcr NEGATIVE  NEGATIVE Final    Studies/Results: Dg Chest Port 1 View  06/11/2014   CLINICAL DATA:  Respiratory distress.  Intubated patient.  EXAM: PORTABLE CHEST - 1 VIEW  COMPARISON:  Single view of the chest 06/10/2014 and 06/09/2014.  FINDINGS: ET tube remains in place and projects in good position. NG tube is seen with the side port at the gastroesophageal junction. Recommend advancement of 4-5 cm. Lungs are clear. Heart size is normal. No pneumothorax or pleural effusion. Right shoulder replacement noted.  IMPRESSION: Side port of the NG tube is at the gastroesophageal junction. Recommend advancement 4-5 cm.  ET tube in good position.  Lungs clear.   Electronically  Signed   By: Inge Rise M.D.   On: 06/11/2014 08:00   Dg Chest Port 1 View  06/10/2014   CLINICAL DATA:  Assess endotracheal tube  EXAM: PORTABLE CHEST - 1 VIEW  COMPARISON:  06/09/2014  FINDINGS: There is an endotracheal to which ends between the clavicles and carina. An orogastric tube enters the stomach.  Normal heart size and mediastinal contours for technique.  Improved basilar lung aeration, with the left diaphragm now visible. No effusion or pneumothorax.  IMPRESSION: 1. Well-positioned endotracheal and orogastric tubes. 2. Continued improvement in basilar lung aeration.  Electronically Signed   By: Jorje Guild M.D.   On: 06/10/2014 06:44   Dg Chest Port 1 View  06/09/2014   CLINICAL DATA:  Respiratory failure.  EXAM: PORTABLE CHEST - 1 VIEW  COMPARISON:  06/08/2014  FINDINGS: Endotracheal tube tip is approximately 4 cm above the carina. Lungs show improved aeration at the left base with mild residual atelectasis remaining. There is no evidence of pulmonary edema, consolidation, pneumothorax, nodule or pleural fluid. The heart size and mediastinal contours are within normal limits. Nasogastric tube continues to extend below the diaphragm.  IMPRESSION: Improved aeration at the left lung base.   Electronically Signed   By: Aletta Edouard M.D.   On: 06/09/2014 07:44   Dg Chest Port 1 View  06/08/2014   CLINICAL DATA:  COPD.  Respiratory failure.  EXAM: PORTABLE CHEST - 1 VIEW  COMPARISON:  Single view of the chest 06/07/2014 and 06/05/2014.  FINDINGS: Support tubes and lines are unchanged. No pneumothorax is identified. Interstitial edema seen on yesterday's examination continues to improve. There is some new subsegmental atelectasis in the left lung base. Heart size is normal.  IMPRESSION: Continued improvement in interstitial pulmonary edema.  Increased subsegmental atelectasis left lung base.   Electronically Signed   By: Inge Rise M.D.   On: 06/08/2014 08:08   Dg Chest Port 1  View  06/07/2014   CLINICAL DATA:  Endotracheal tube.  EXAM: PORTABLE CHEST - 1 VIEW  COMPARISON:  06/05/2014.  FINDINGS: Endotracheal tube ends at the level of the mid thoracic trachea. A new orogastric tube enters the stomach at least. Interval decrease in diffuse interstitial and airspace opacities. No evidence of pneumothorax or increasing pleural fluid. Right glenohumeral arthroplasty noted.  IMPRESSION: 1. Endotracheal and orogastric tubes are in good position. 2. Decreasing pulmonary edema.   Electronically Signed   By: Jorje Guild M.D.   On: 06/07/2014 06:49   Portable Chest Xray  06/06/2014   CLINICAL DATA:  Endotracheal tube placement.  EXAM: PORTABLE CHEST - 1 VIEW  COMPARISON:  06/05/2014  FINDINGS: Interval placement of an endotracheal tube with tip measuring 4.3 cm above the carinal. Shallow inspiration. Mild cardiac enlargement with diffuse bilateral pulmonary infiltration, likely edema. Postoperative change in the right shoulder. No blunting of costophrenic angles. No pneumothorax.  IMPRESSION: Endotracheal tube tip measures 4.3 cm above the carina. Diffuse bilateral pulmonary parenchymal infiltrates again demonstrated.   Electronically Signed   By: Lucienne Capers M.D.   On: 06/06/2014 00:29   Dg Chest Port 1 View  06/05/2014   CLINICAL DATA:  Pulmonary edema.  EXAM: PORTABLE CHEST - 1 VIEW  COMPARISON:  06/03/2014  FINDINGS: Bilateral pulmonary edema has increased diffusely on the left and in the right upper lobe. Edema at the right base appears slightly improved.  Small right effusion. Heart size is within normal limits. No acute osseous abnormality.  IMPRESSION: Pulmonary edema has slightly increased.   Electronically Signed   By: Rozetta Nunnery M.D.   On: 06/05/2014 08:15   Dg Chest Portable 1 View  06/03/2014   CLINICAL DATA:  Respiratory distress.  EXAM: PORTABLE CHEST - 1 VIEW  COMPARISON:  Chest x-ray 09/25/2010 .  FINDINGS: Cardiomegaly with pulmonary vascular prominence and  interstitial prominence present consistent we congestive heart failure and pulmonary edema. No focal alveolar infiltrate. No pleural effusion or pneumothorax. Right shoulder replacement.  IMPRESSION: Congestive heart failure with pulmonary interstitial edema.   Electronically Signed   By: Marcello Moores  Register   On:  06/03/2014 10:09   Dg Abd Portable 1v  06/06/2014   CLINICAL DATA:  OG tube placement  EXAM: PORTABLE ABDOMEN - 1 VIEW  COMPARISON:  03/19/2014; 10/09/2012; CTA - 02/17/2014  FINDINGS: Interval placement of enteric tube with tip and side port overlying the expected location of the mid body of the stomach.  There is moderate gas distention of the colon without definitive gas distention of the upstream small bowel. No supine evidence of pneumoperitoneum. No definite pneumatosis or portal venous gas.  There has been interval passage of previously ingested endoscopy capsule.  Vascular calcifications.  IMPRESSION: 1. Enteric tube tip and side port projects over the mid body of the stomach. 2. Nonspecific mild gas distention of the colon could be indicative of mild ileus.   Electronically Signed   By: Sandi Mariscal M.D.   On: 06/06/2014 13:37   Dg Swallowing Func-speech Pathology  06/14/2014   Katherene Ponto Deblois, CCC-SLP     06/14/2014 10:32 AM Objective Swallowing Evaluation: Modified Barium Swallowing Study   Patient Details  Name: Roy [name] MRN: [id number] Date of Birth: Aug 13, 1946  Today's Date: 06/14/2014 Time: 0950-1015 SLP Time Calculation (min): 25 min  Past Medical History:  Past Medical History  Diagnosis Date  . Cerebrovascular disease 2009    TIA; 2009- right ICA stent; re-intervention for restenosis  complicated by [facility] w/o sx  . Degenerative joint disease     Total shoulder arthroplasty-right  . LBBB (left bundle branch block)     Normal echo-2011; stress nuclear in 09/2010--septal  hypoperfusion representing nontransmural infarction or the effect  of left bundle branch block, no ischemia  .  Hyperlipidemia   . Hypertension   . Chronic obstructive pulmonary disease   . Tobacco abuse     -100 pack years; 1.5 packs per day  . Anxiety and depression   . Alcohol abuse     6 beers per day; hospital admission in 2009 for withdrawal  symptoms  . Thrombocytopenia   . Tubular adenoma of colon    Past Surgical History:  Past Surgical History  Procedure Laterality Date  . Lumbar fusion  2010  . Vasectomy  1971  . Colonoscopy w/ polypectomy  2011  . Total shoulder arthroplasty  2011    Right-Dr. Tamera Punt  . Colonoscopy  08/22/09    Fields-(Tubular Adenoma)3-mm transverse polyp/4-mm polyp  otherwise noraml/small internal hemorrhoids  . Esophagogastroduodenoscopy [name] 03/05/2014    Procedure: ESOPHAGOGASTRODUODENOSCOPY (EGD);  Surgeon: Danie Binder, MD;  Location: [facility];  Service: Endoscopy;   Laterality: [name];  11:45  . Agile capsule [name] 03/18/2014    Procedure: AGILE CAPSULE;  Surgeon: Danie Binder, MD;   Location: [facility];  Service: Endoscopy;  Laterality: [name];   7:30  . Givens capsule study [name] 03/30/2014    Procedure: GIVENS CAPSULE STUDY;  Surgeon: Danie Binder, MD;   Location: [facility];  Service: Endoscopy;  Laterality: [name];   7:30  . Colonoscopy [name] 04/14/2014    Procedure: COLONOSCOPY;  Surgeon: Danie Binder, MD;   Location: [facility];  Service: Endoscopy;  Laterality: [name];   9:42   HPI:  68 year old male, current smoker with PMH of COPD, alcohol abuse,  chronic bronchitis, TIA, HTN, LBBB who presented to [facility] ED via  EMS 7/9 for progressively worsening SOB and respiratory distress.  Transferred to [facility].  Intubated 7/10-7/17. NSTEMI     Assessment / Plan / Recommendation Clinical Impression  Dysphagia Diagnosis: Moderate oral  phase dysphagia;Mild  pharyngeal phase dysphagia Clinical impression: Pt presents with a moderate oral dysphagia  impacting safety with PO. Pt is moderately confused, edentulous  and tremulous with very dry oral mucosa. With most boluses, pt  will briefly hold  bolus orally with occasional lingual tremors  prior to active transit. Intermittently pt has premature spillage  of partial bolus to pyriforms and sometimes pt actively transits  bolus with a delayed swallow initiation and aspiration before the  swallow. Of note, pt often clears throat post swallow, unrelated  to penetration or aspiration. Initially aspiration event was  silent, but then followed by late, hard, effective cough that  expelled thin aspirate. In some trials swallow was timely,  suggestive of a primary cognitive problem rather than a true  sensory deficit. Strength is WNL and trace residuals coating all  oropahryngeal structures likely due to dry mucosa. Recommend  nectar thick liquids as pt orally manages this bolus best and has  decreased risk of aspiration before the swallow. Will also start  pt with a puree diet, though expect as mentation improves he may  tolerate soft solids and thin liquids well.     Treatment Recommendation  Therapy as outlined in treatment plan below    Diet Recommendation Dysphagia 1 (Puree);Nectar-thick liquid   Liquid Administration via: Cup;Straw Medication Administration: Crushed with puree Supervision: Staff to assist with self feeding Compensations: Slow rate;Small sips/bites Postural Changes and/or Swallow Maneuvers: Seated upright 90  degrees;Upright 30-60 min after meal    Other  Recommendations Oral Care Recommendations: Oral care BID Other Recommendations: Order thickener from pharmacy   Follow Up Recommendations  Skilled Nursing facility    Frequency and Duration min 2x/week  2 weeks   Pertinent Vitals/Pain NA    SLP Swallow Goals     General Date of Onset: 06/03/14 HPI: 68 year old male, current smoker with PMH of COPD, alcohol  abuse, chronic bronchitis, TIA, HTN, LBBB who presented to [facility] ED  via EMS 7/9 for progressively worsening SOB and respiratory  distress. Transferred to [facility].  Intubated 7/10-7/17. NSTEMI Type of Study: Modified Barium Swallowing Study  Reason for Referral: Objectively evaluate swallowing function Previous Swallow Assessment: none per records Diet Prior to this Study: NPO Temperature Spikes Noted: No Respiratory Status: Room air History of Recent Intubation: Yes Length of Intubations (days): 7 days Date extubated: 06/11/14 Behavior/Cognition: Alert;Cooperative;Decreased sustained  attention;Requires cueing;Distractible Oral Cavity - Dentition: Edentulous Oral Motor / Sensory Function: Within functional limits Self-Feeding Abilities: Needs assist Patient Positioning: Upright in chair Baseline Vocal Quality: Breathy;Hoarse;Low vocal intensity Volitional Cough: Strong Volitional Swallow: Able to elicit Anatomy:  (dry oral mucosa) Pharyngeal Secretions: Not observed secondary MBS    Reason for Referral Objectively evaluate swallowing function   Oral Phase Oral Preparation/Oral Phase Oral Phase: Impaired Oral - Nectar Oral - Nectar Cup: Lingual/palatal residue;Delayed oral  transit;Reduced posterior propulsion;Holding of bolus;Lingual  pumping Oral - Nectar Straw: Lingual/palatal residue;Delayed oral  transit;Reduced posterior propulsion;Holding of bolus;Lingual  pumping Oral - Thin Oral - Thin Cup: Lingual/palatal residue;Delayed oral  transit;Reduced posterior propulsion;Holding of bolus;Lingual  pumping Oral - Thin Straw: Lingual/palatal residue;Delayed oral  transit;Reduced posterior propulsion;Holding of bolus;Lingual  pumping Oral - Solids Oral - Puree: Lingual/palatal residue;Delayed oral  transit;Reduced posterior propulsion;Holding of bolus;Lingual  pumping Oral - Mechanical Soft: Lingual/palatal residue;Delayed oral  transit;Reduced posterior propulsion;Holding of bolus;Lingual  pumping;Piecemeal swallowing Oral - Pill: Lingual/palatal residue;Delayed oral transit;Reduced  posterior propulsion;Holding of bolus;Lingual pumping   Pharyngeal Phase Pharyngeal Phase Pharyngeal Phase: Impaired Pharyngeal -  Nectar Pharyngeal - Nectar Cup: Delayed  swallow initiation;Premature  spillage to valleculae Pharyngeal - Nectar Straw: Delayed swallow initiation;Premature  spillage to valleculae Pharyngeal - Thin Pharyngeal - Thin Cup: Delayed swallow initiation;Premature  spillage to pyriform sinuses Pharyngeal - Thin Straw: Delayed swallow initiation;Premature  spillage to pyriform sinuses;Penetration/Aspiration before  swallow;Moderate aspiration Penetration/Aspiration details (thin straw): Material enters  airway, passes BELOW cords without attempt by patient to eject  out (silent aspiration) Pharyngeal - Solids Pharyngeal - Puree: Delayed swallow initiation Pharyngeal - Mechanical Soft: Delayed swallow  initiation;Premature spillage to pyriform sinuses  Cervical Esophageal Phase    GO    Cervical Esophageal Phase Cervical Esophageal Phase: [facility]        Herbie Baltimore, MA CCC-SLP [phone]  DeBlois, Katherene Ponto 06/14/2014, 10:27 AM     Medications: Scheduled Meds: . antiseptic oral rinse  15 mL Mouth Rinse QID  . aspirin  81 mg Oral Daily  . atorvastatin  80 mg Oral q1800  . chlorhexidine  15 mL Mouth Rinse BID  . clopidogrel  75 mg Oral Daily  . feeding supplement (ENSURE)  1 Container Oral BID BM  . folic acid  1 mg Oral Daily  . heparin subcutaneous  5,000 Units Subcutaneous 3 times per day  . insulin aspart  0-15 Units Subcutaneous TID WC  . insulin aspart  0-5 Units Subcutaneous QHS  . metoprolol tartrate  25 mg Oral TID  . multivitamin with minerals  1 tablet Oral Daily  . nicotine  21 mg Transdermal Daily  . risperiDONE  0.5 mg Oral q morning - 10a  . risperiDONE  1 mg Oral QHS  . sodium chloride  3 mL Intravenous Q12H  . thiamine  100 mg Oral Daily      LOS: 15 days   Jammi Morrissette DO [facility] 06/18/2014, 10:30 AM Pager: [phone]  If 7PM-7AM, please contact night-coverage [email or web address] Password [email or web address]  **Disclaimer: This note was dictated with voice recognition software. Similar sounding words can inadvertently be  transcribed and this note may contain transcription errors which may not have been corrected upon publication of note.**

## 2014-06-18 NOTE — Progress Notes (Signed)
CSW called Tammy at [facility]) and informed her that pt is not stable for discharge today. [facility] agreeable to accept pt over the weekend however they currently only have 1 bed available. Pt does have other offers in the instance that this bed a [facility] is not available on the day of discharge. Unit CSW to assist with dc once pt is medically ready.  Hunt Oris, MSW, [place]

## 2014-06-18 NOTE — Progress Notes (Signed)
Speech Language Pathology Treatment: Dysphagia  Patient Details Name: Roy [name] MRN: [id number] DOB: Oct 08, 1946 Today's Date: 06/18/2014 Time: 4628-6381 SLP Time Calculation (min): 8 min  Assessment / Plan / Recommendation Clinical Impression  Dysphagia intervention included observation and determination of safety during consumption of nectar liquids with mildly delayed oral transit noted.  Baseline cough prior to po's and wet vocal quality following nectar, sensed and reflexive throat clear cleared vocal quality.  Intermittent possible penetration with a delayed sensation.  Pt. Appears to possibly be at a chronic risk for aspiration.  Recommend continue Dys 1 and nectar thick liquids and ST   HPI HPI: 68 year old male, current smoker with PMH of COPD, alcohol abuse, chronic bronchitis, TIA, HTN, LBBB who presented to [facility] ED via EMS 7/9 for progressively worsening SOB and respiratory distress. Transferred to [facility].  Intubated 7/10-7/17. NSTEMI   Pertinent Vitals WDL  SLP Plan  Continue with current plan of care    Recommendations Diet recommendations: Dysphagia 1 (puree);Nectar-thick liquid Liquids provided via: Cup Medication Administration: Crushed with puree Supervision: Staff to assist with self feeding Compensations: Slow rate;Small sips/bites Postural Changes and/or Swallow Maneuvers: Seated upright 90 degrees;Upright 30-60 min after meal              Oral Care Recommendations: Oral care BID Follow up Recommendations: Skilled Nursing facility Plan: Continue with current plan of care    GO     Houston Siren M.Ed [employer] [phone]  06/18/2014

## 2014-06-18 NOTE — Progress Notes (Signed)
[facility] Progress Note    Assessment/ Plan:    1. Acute kidney injury - appears to be volume related although certainly possible he has developed ATN since hospitalization. His MAP has also dropped since ~7/18 with intermittent and sometimes persistent hypotensive episodes.  2. Hypernatremia - related to volume status and patient unlikely to have been consuming much free water. Initially his free water deficit was 4.7L. Of note his weight has decreased from 69kg at admission to 58kg to 60.7kg today with an net fluid I&O of +117 during the hospitalization. Part of the problem is the patient's output was not accurate because a foley was only placed on 7/22. 3. But with all the fluid he has gotten over the past couple days I have a hard time believing that he's still ~9kg lighter than at time of admission. ???  - over night his sodium was dropping nicely  - I ordered the d5w to be discontinued at midnight today as we need to see where he's at without free water replacement. We will also need to monitor his input closely; if he's not consuming free water then his Na will predictably increase. - would also like to d/c the foley tomorrow if his SNa is normal. 3. 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 COPD 5. Confusion    Subjective:   No complaints but still confused. Moving all four extremities.  I&O/24hr 2211 - 1850 = +361 UOP was 1850   Objective:   BP 122/67  Pulse 92  Temp(Src) 97.5 F (36.4 C) (Oral)  Resp 20  Ht 5\' 6"  (1.676 m)  Wt 60.7 kg (133 lb 13.1 oz)  BMI 21.61 kg/m2  SpO2 97%  Intake/Output Summary (Last 24 hours) at 06/18/14 1243 Last data filed at 06/18/14 1031  Gross per 24 hour  Intake 2331.25 ml  Output   1800 ml  Net 531.25 ml   Weight change: 0.92 kg (2 lb 0.5 oz)  Physical Exam: General appearance: distracted and slowed mentation  Head: Normocephalic, without obvious abnormality, atraumatic  Resp: diminished breath sounds anterior - bilateral and  bibasilar  Chest wall: no tenderness  Cardio: S1, S2 normal  GI: soft, non-tender; bowel sounds normal; no masses, no organomegaly  Extremities: extremities normal, atraumatic, no cyanosis or edema  Neurologic: Mental status:confused   Imaging: Ct Head Wo Contrast  06/17/2014   CLINICAL DATA:  Altered mental status  EXAM: CT HEAD WITHOUT CONTRAST  TECHNIQUE: Contiguous axial images were obtained from the base of the skull through the vertex without intravenous contrast.  COMPARISON:  10/09/2012  FINDINGS: There is no evidence of mass effect, midline shift, or extra-axial fluid collections. There is no evidence of a space-occupying lesion or intracranial hemorrhage. There is no evidence of a cortical-based area of acute infarction. There is generalized cerebral atrophy. There is periventricular white matter low attenuation likely secondary to microangiopathy.  The ventricles and sulci are appropriate for the patient's age. The basal cisterns are patent.  Visualized portions of the orbits are unremarkable. Small left maxillary sinus air-fluid level. Remainder of the paranasal sinuses are clear. Mastoid sinuses are clear. Cerebrovascular atherosclerotic calcifications are noted. Right cavernous carotid artery stent is present.  The osseous structures are unremarkable.  IMPRESSION: No acute intracranial pathology.   Electronically Signed   By: Kathreen Devoid   On: 06/17/2014 08:51    Labs: BMET  Recent Labs Lab 06/12/14 1610  06/16/14 9604 06/16/14 5409 06/16/14 1402 06/17/14 0405 06/17/14 1125 06/17/14 1410 06/18/14 0310  NA 147  < > 160* 161* 155* 150* 149* 150* 149*  K 3.6*  < > 3.5* 3.6* 3.5* 3.1* 3.5* 3.8 3.2*  CL 94*  < > 120* 120* 116* 114* 112 112 111  CO2 31  < > 20 20 21 20 21 22 21   GLUCOSE 146*  < > 170* 173* 150* 158* 131* 189* 142*  BUN 110*  < > 153* 156* 130* 105* 91* 87* 71*  CREATININE 1.14  < > 3.70* 3.76* 3.01* 2.43* 2.21* 2.18* 1.83*  CALCIUM 11.0*  < > 9.6 9.5 9.5 9.2  9.3 9.2 9.2  PHOS 5.5*  --   --   --   --   --   --   --   --   < > = values in this interval not displayed. CBC  Recent Labs Lab 06/13/14 0220 06/14/14 0350 06/15/14 0546 06/18/14 0310  WBC 16.5* 15.5* 13.4* 9.4  HGB 12.7* 11.9* 10.8* 10.9*  HCT 39.1 38.1* 34.4* 34.2*  MCV 89.7 90.3 89.4 88.6  PLT 791* 581* 500* 358    Medications:    . antiseptic oral rinse  15 mL Mouth Rinse QID  . aspirin  81 mg Oral Daily  . atorvastatin  80 mg Oral q1800  . chlorhexidine  15 mL Mouth Rinse BID  . clopidogrel  75 mg Oral Daily  . feeding supplement (ENSURE)  1 Container Oral BID BM  . folic acid  1 mg Oral Daily  . heparin subcutaneous  5,000 Units Subcutaneous 3 times per day  . insulin aspart  0-15 Units Subcutaneous TID WC  . insulin aspart  0-5 Units Subcutaneous QHS  . metoprolol tartrate  25 mg Oral TID  . multivitamin with minerals  1 tablet Oral Daily  . nicotine  21 mg Transdermal Daily  . risperiDONE  0.5 mg Oral q morning - 10a  . risperiDONE  1 mg Oral QHS  . sodium chloride  3 mL Intravenous Q12H  . thiamine  100 mg Oral Daily      Otelia Santee, MD 06/18/2014, 12:43 PM

## 2014-06-19 LAB — GLUCOSE, CAPILLARY
GLUCOSE-CAPILLARY: 130 mg/dL — AB (ref 70–99)
GLUCOSE-CAPILLARY: 114 mg/dL — AB (ref 70–99)
Glucose-Capillary: 126 mg/dL — ABNORMAL HIGH (ref 70–99)
Glucose-Capillary: 129 mg/dL — ABNORMAL HIGH (ref 70–99)

## 2014-06-19 LAB — BASIC METABOLIC PANEL
Anion gap: 17 — ABNORMAL HIGH (ref 5–15)
BUN: 44 mg/dL — ABNORMAL HIGH (ref 6–23)
CO2: 19 meq/L (ref 19–32)
Calcium: 9.2 mg/dL (ref 8.4–10.5)
Chloride: 108 mEq/L (ref 96–112)
Creatinine, Ser: 1.34 mg/dL (ref 0.50–1.35)
GFR calc Af Amer: 62 mL/min — ABNORMAL LOW (ref >90)
GFR, EST NON AFRICAN AMERICAN: 53 mL/min — AB (ref >90)
Glucose, Bld: 117 mg/dL — ABNORMAL HIGH (ref 70–99)
POTASSIUM: 3.3 meq/L — AB (ref 3.7–5.3)
SODIUM: 144 meq/L (ref 137–147)

## 2014-06-19 LAB — STOOL CULTURE: Special Requests: NORMAL

## 2014-06-19 MED ORDER — POTASSIUM CHLORIDE CRYS ER 20 MEQ PO TBCR
40.0000 meq | EXTENDED_RELEASE_TABLET | Freq: Once | ORAL | Status: AC
  Administered 2014-06-19: 40 meq via ORAL
  Filled 2014-06-19: qty 2

## 2014-06-19 MED ORDER — POTASSIUM CHLORIDE 20 MEQ PO PACK
40.0000 meq | PACK | Freq: Once | ORAL | Status: DC
  Filled 2014-06-19: qty 2

## 2014-06-19 NOTE — Progress Notes (Signed)
[facility] Progress Note    Assessment/ Plan:  1.  Acute kidney injury - appears to be volume related although certainly possible he has developed ATN since hospitalization. His MAP has also dropped since ~7/18 with intermittent and sometimes persistent hypotensive episodes.  2. Hypernatremia - related to volume status and patient unlikely to have been consuming much free water. Initially his free water deficit was 4.7L. Of note his weight has decreased from 69kg at admission to 58kg to 60.7kg today with an net fluid I&O of +117 during the hospitalization. Part of the problem is the patient's output was not accurate because a foley was only placed on 7/22.  - he's finally in the normal range for his SNa and renal function has improved as well; big question now is what will happen if we stop the free water? - he needs to be drinking consistently on his own otherwise he will quickly develop a free water deficit. - change to d5w at 33ml KVO and will recheck in AM. Will need to stop all fluids soon if SNa doesn't climb over night. - would also like to d/c the foley today.       4.   CHF with EF of 20% 5.   COPD 6.   Confusion     Subjective:   No complaints but still confused.  Moving all four extremities.   UOP 1650 over past 24hrs.   Objective:   BP 119/61  Pulse 104  Temp(Src) 98.1 F (36.7 C) (Oral)  Resp 19  Ht 5\' 6"  (1.676 m)  Wt 61.1 kg (134 lb 11.2 oz)  BMI 21.75 kg/m2  SpO2 100%  Intake/Output Summary (Last 24 hours) at 06/19/14 6256 Last data filed at 06/19/14 3893  Gross per 24 hour  Intake    420 ml  Output   1651 ml  Net  -1231 ml   Weight change: 0.4 kg (14.1 oz)  Physical Exam: General appearance: distracted and slowed mentation  Head: Normocephalic, without obvious abnormality, atraumatic  Resp: diminished breath sounds anterior - bilateral and bibasilar  Chest wall: no tenderness  Cardio: S1, S2 normal  GI: soft, non-tender; bowel  sounds normal; no masses, no organomegaly  Extremities: extremities normal, atraumatic, no cyanosis or edema  Neurologic: Mental status:confused   Imaging: No results found.  Labs: BMET  Recent Labs Lab 06/16/14 0043 06/16/14 1402 06/17/14 0405 06/17/14 1125 06/17/14 1410 06/18/14 0310 06/19/14 0311  NA 161* 155* 150* 149* 150* 149* 144  K 3.6* 3.5* 3.1* 3.5* 3.8 3.2* 3.3*  CL 120* 116* 114* 112 112 111 108  CO2 20 21 20 21 22 21 19   GLUCOSE 173* 150* 158* 131* 189* 142* 117*  BUN 156* 130* 105* 91* 87* 71* 44*  CREATININE 3.76* 3.01* 2.43* 2.21* 2.18* 1.83* 1.34  CALCIUM 9.5 9.5 9.2 9.3 9.2 9.2 9.2   CBC  Recent Labs Lab 06/13/14 0220 06/14/14 0350 06/15/14 0546 06/18/14 0310  WBC 16.5* 15.5* 13.4* 9.4  HGB 12.7* 11.9* 10.8* 10.9*  HCT 39.1 38.1* 34.4* 34.2*  MCV 89.7 90.3 89.4 88.6  PLT 791* 581* 500* 358    Medications:    . antiseptic oral rinse  15 mL Mouth Rinse QID  . aspirin  81 mg Oral Daily  . atorvastatin  80 mg Oral q1800  . chlorhexidine  15 mL Mouth Rinse BID  . clopidogrel  75 mg Oral Daily  . feeding supplement (ENSURE)  1 Container Oral BID BM  . folic  acid  1 mg Oral Daily  . heparin subcutaneous  5,000 Units Subcutaneous 3 times per day  . insulin aspart  0-15 Units Subcutaneous TID WC  . insulin aspart  0-5 Units Subcutaneous QHS  . metoprolol tartrate  25 mg Oral TID  . multivitamin with minerals  1 tablet Oral Daily  . nicotine  21 mg Transdermal Daily  . potassium chloride  40 mEq Oral Once  . risperiDONE  0.5 mg Oral q morning - 10a  . risperiDONE  1 mg Oral QHS  . sodium chloride  3 mL Intravenous Q12H  . thiamine  100 mg Oral Daily      Otelia Santee, MD 06/19/2014, 9:53 AM

## 2014-06-19 NOTE — Progress Notes (Signed)
Patient ID: Roy [name]  male  WNI:[id number]    DOB: [birth year]    DOA: 06/03/2014  PCP: [name], MD  BRIEF PATIENT DESCRIPTION: 68 y.o. active smoker (120 pack years) with COPD admitted 7/9 with pneumonia and hypoxemic respiratory failure. Initially with elevated troponins (peaked at 4.84) but EKG neg acute MI, CXR with pulm edema and ultimately Found to have severe systolic dysfunction with an EF of 20% with global hypokinesis. He was intubated and treated for AECOPD +/- PNA with IV steroids, empiric IV abx for CAP, Nebulized BDs, diuresis and cardiology was consulted. He was successfully extubated 7/17 and he was continued on BD's and aggressive pulm hygiene. He improved with abx and diuresis and and cardiology followed closely for NSTEMI for medical management with asa, plavix, B blocker, statin. Initial cardiology plan was for cardiac cath but course was c/b ETOH withdrawal and metabolic encephalopathy and cath was deferred in setting confusion. At this time he is overall much improved, remains moderately confused but improving. Passed swallow eval with only mild aspiration and is ok for PO diet per speech therapy. He needs continued rehab efforts and plan is for d/c to SNF [facility]) when medically ready.    Assessment/Plan:   Acute respiratory failure - resolved: multifactorial likely due to acute CHF, community acquired pneumonia, COPD exacerbation - O2 sats 99% on room air  Acute encephalopathy:  - Possibly multifactorial due to metabolic encephalopathy from hypernatremia, uremia, alcohol withdrawal - Continue correcting metabolic abnormalities, hypernatremia, acute kidney injury -d/c ATC ativan-- MUCH more awake now -head CT ok    Tobacco abuse - Continue nicotine patch    Alcohol withdrawal delirium - required Precedex drip and BZD's in the ICU    Pneumonia, organism unspecified - Has completed  course of Zithromax and Rocephin    NSTEMI (non-ST elevated myocardial  infarction) with acute systolic CHF - EF 38%, possibly ischemic, cardiology following, recommended medical management at this time, not a candidate for cardiac cath given his renal failure - Not a candidate for ACE/ARB due to acute renal insufficiency    COPD exacerbation - Stable, now improving     Hypernatremia With Acute renal :   - Nephrology following, will defer to renal for management -improved  Diarrhea: C. difficile negative, improving  Hypokalemia -replete      Code Status: full  Family Communication: wife at bedside Friday  Disposition: SNF on Monday  Consultants:  Cardiology   Nephrology   Procedures:  None   Antibiotics:     Subjective: Asking for a beer  Objective: Weight change: 0.4 kg (14.1 oz)  Intake/Output Summary (Last 24 hours) at 06/19/14 0936 Last data filed at 06/19/14 0906  Gross per 24 hour  Intake    420 ml  Output   1651 ml  Net  -1231 ml   Blood pressure 119/61, pulse 104, temperature 98.1 F (36.7 C), temperature source Oral, resp. rate 19, height 5\' 6"  (1.676 m), weight 61.1 kg (134 lb 11.2 oz), SpO2 100.00%.  Physical Exam: General: awake, following commands CVS: S1-S2 clear, no murmur rubs or gallops Chest: clear to auscultation bilaterally, no wheezing, rales or rhonchi Abdomen: soft nontender, nondistended, normal bowel sounds  Extremities: no cyanosis, clubbing or edema noted bilaterally   Lab Results: Basic Metabolic Panel:  Recent Labs Lab 06/14/14 0350  06/18/14 0310 06/19/14 0311  NA 156*  < > 149* 144  K 3.8  < > 3.2* 3.3*  CL 107  < >  111 108  CO2 22  < > 21 19  GLUCOSE 152*  < > 142* 117*  BUN 177*  < > 71* 44*  CREATININE 4.60*  < > 1.83* 1.34  CALCIUM 9.9  < > 9.2 9.2  MG 3.6*  --   --   --   < > = values in this interval not displayed. Liver Function Tests: No results found for this basename: AST, ALT, ALKPHOS, BILITOT, PROT, ALBUMIN,  in the last 168 hours No results found for this  basename: LIPASE, AMYLASE,  in the last 168 hours  Recent Labs Lab 06/16/14 0041  AMMONIA 28   CBC:  Recent Labs Lab 06/15/14 0546 06/18/14 0310  WBC 13.4* 9.4  HGB 10.8* 10.9*  HCT 34.4* 34.2*  MCV 89.4 88.6  PLT 500* 358   Cardiac Enzymes: No results found for this basename: CKTOTAL, CKMB, CKMBINDEX, TROPONINI,  in the last 168 hours BNP: No components found with this basename: POCBNP,  CBG:  Recent Labs Lab 06/18/14 0622 06/18/14 1242 06/18/14 1625 06/18/14 2118 06/19/14 0554  GLUCAP 143* 134* 100* 161* 130*     Micro Results: Recent Results (from the past 240 hour(s))  STOOL CULTURE     Status: None   Collection Time    06/15/14  7:24 AM      Result Value Ref Range Status   Specimen Description STOOL   Final   Special Requests Normal   Final   Culture     Final   Value: NO SUSPICIOUS COLONIES, CONTINUING TO HOLD     Note: REDUCED NORMAL FLORA PRESENT     Performed at [facility]   Report Status PENDING   Incomplete  CLOSTRIDIUM DIFFICILE BY PCR     Status: None   Collection Time    06/15/14  7:24 AM      Result Value Ref Range Status   C difficile by pcr NEGATIVE  NEGATIVE Final    Studies/Results: Dg Chest Port 1 View  06/11/2014   CLINICAL DATA:  Respiratory distress.  Intubated patient.  EXAM: PORTABLE CHEST - 1 VIEW  COMPARISON:  Single view of the chest 06/10/2014 and 06/09/2014.  FINDINGS: ET tube remains in place and projects in good position. NG tube is seen with the side port at the gastroesophageal junction. Recommend advancement of 4-5 cm. Lungs are clear. Heart size is normal. No pneumothorax or pleural effusion. Right shoulder replacement noted.  IMPRESSION: Side port of the NG tube is at the gastroesophageal junction. Recommend advancement 4-5 cm.  ET tube in good position.  Lungs clear.   Electronically Signed   By: Inge Rise M.D.   On: 06/11/2014 08:00   Dg Chest Port 1 View  06/10/2014   CLINICAL DATA:  Assess  endotracheal tube  EXAM: PORTABLE CHEST - 1 VIEW  COMPARISON:  06/09/2014  FINDINGS: There is an endotracheal to which ends between the clavicles and carina. An orogastric tube enters the stomach.  Normal heart size and mediastinal contours for technique.  Improved basilar lung aeration, with the left diaphragm now visible. No effusion or pneumothorax.  IMPRESSION: 1. Well-positioned endotracheal and orogastric tubes. 2. Continued improvement in basilar lung aeration.   Electronically Signed   By: Jorje Guild M.D.   On: 06/10/2014 06:44   Dg Chest Port 1 View  06/09/2014   CLINICAL DATA:  Respiratory failure.  EXAM: PORTABLE CHEST - 1 VIEW  COMPARISON:  06/08/2014  FINDINGS: Endotracheal tube tip is approximately 4  cm above the carina. Lungs show improved aeration at the left base with mild residual atelectasis remaining. There is no evidence of pulmonary edema, consolidation, pneumothorax, nodule or pleural fluid. The heart size and mediastinal contours are within normal limits. Nasogastric tube continues to extend below the diaphragm.  IMPRESSION: Improved aeration at the left lung base.   Electronically Signed   By: Aletta Edouard M.D.   On: 06/09/2014 07:44   Dg Chest Port 1 View  06/08/2014   CLINICAL DATA:  COPD.  Respiratory failure.  EXAM: PORTABLE CHEST - 1 VIEW  COMPARISON:  Single view of the chest 06/07/2014 and 06/05/2014.  FINDINGS: Support tubes and lines are unchanged. No pneumothorax is identified. Interstitial edema seen on yesterday's examination continues to improve. There is some new subsegmental atelectasis in the left lung base. Heart size is normal.  IMPRESSION: Continued improvement in interstitial pulmonary edema.  Increased subsegmental atelectasis left lung base.   Electronically Signed   By: Inge Rise M.D.   On: 06/08/2014 08:08   Dg Chest Port 1 View  06/07/2014   CLINICAL DATA:  Endotracheal tube.  EXAM: PORTABLE CHEST - 1 VIEW  COMPARISON:  06/05/2014.  FINDINGS:  Endotracheal tube ends at the level of the mid thoracic trachea. A new orogastric tube enters the stomach at least. Interval decrease in diffuse interstitial and airspace opacities. No evidence of pneumothorax or increasing pleural fluid. Right glenohumeral arthroplasty noted.  IMPRESSION: 1. Endotracheal and orogastric tubes are in good position. 2. Decreasing pulmonary edema.   Electronically Signed   By: Jorje Guild M.D.   On: 06/07/2014 06:49   Portable Chest Xray  06/06/2014   CLINICAL DATA:  Endotracheal tube placement.  EXAM: PORTABLE CHEST - 1 VIEW  COMPARISON:  06/05/2014  FINDINGS: Interval placement of an endotracheal tube with tip measuring 4.3 cm above the carinal. Shallow inspiration. Mild cardiac enlargement with diffuse bilateral pulmonary infiltration, likely edema. Postoperative change in the right shoulder. No blunting of costophrenic angles. No pneumothorax.  IMPRESSION: Endotracheal tube tip measures 4.3 cm above the carina. Diffuse bilateral pulmonary parenchymal infiltrates again demonstrated.   Electronically Signed   By: Lucienne Capers M.D.   On: 06/06/2014 00:29   Dg Chest Port 1 View  06/05/2014   CLINICAL DATA:  Pulmonary edema.  EXAM: PORTABLE CHEST - 1 VIEW  COMPARISON:  06/03/2014  FINDINGS: Bilateral pulmonary edema has increased diffusely on the left and in the right upper lobe. Edema at the right base appears slightly improved.  Small right effusion. Heart size is within normal limits. No acute osseous abnormality.  IMPRESSION: Pulmonary edema has slightly increased.   Electronically Signed   By: Rozetta Nunnery M.D.   On: 06/05/2014 08:15   Dg Chest Portable 1 View  06/03/2014   CLINICAL DATA:  Respiratory distress.  EXAM: PORTABLE CHEST - 1 VIEW  COMPARISON:  Chest x-ray 09/25/2010 .  FINDINGS: Cardiomegaly with pulmonary vascular prominence and interstitial prominence present consistent we congestive heart failure and pulmonary edema. No focal alveolar infiltrate. No  pleural effusion or pneumothorax. Right shoulder replacement.  IMPRESSION: Congestive heart failure with pulmonary interstitial edema.   Electronically Signed   By: Marcello Moores  Register   On: 06/03/2014 10:09   Dg Abd Portable 1v  06/06/2014   CLINICAL DATA:  OG tube placement  EXAM: PORTABLE ABDOMEN - 1 VIEW  COMPARISON:  03/19/2014; 10/09/2012; CTA - 02/17/2014  FINDINGS: Interval placement of enteric tube with tip and side port overlying the expected location  of the mid body of the stomach.  There is moderate gas distention of the colon without definitive gas distention of the upstream small bowel. No supine evidence of pneumoperitoneum. No definite pneumatosis or portal venous gas.  There has been interval passage of previously ingested endoscopy capsule.  Vascular calcifications.  IMPRESSION: 1. Enteric tube tip and side port projects over the mid body of the stomach. 2. Nonspecific mild gas distention of the colon could be indicative of mild ileus.   Electronically Signed   By: Sandi Mariscal M.D.   On: 06/06/2014 13:37   Dg Swallowing Func-speech Pathology  06/14/2014   Katherene Ponto Deblois, CCC-SLP     06/14/2014 10:32 AM Objective Swallowing Evaluation: Modified Barium Swallowing Study   Patient Details  Name: SHANDELL JALLOW MRN: [id number] Date of Birth: Mar 22, 1946  Today's Date: 06/14/2014 Time: 0950-1015 SLP Time Calculation (min): 25 min  Past Medical History:  Past Medical History  Diagnosis Date  . Cerebrovascular disease 2009    TIA; 2009- right ICA stent; re-intervention for restenosis  complicated by [facility] w/o sx  . Degenerative joint disease     Total shoulder arthroplasty-right  . LBBB (left bundle branch block)     Normal echo-2011; stress nuclear in 09/2010--septal  hypoperfusion representing nontransmural infarction or the effect  of left bundle branch block, no ischemia  . Hyperlipidemia   . Hypertension   . Chronic obstructive pulmonary disease   . Tobacco abuse     -100 pack years; 1.5 packs  per day  . Anxiety and depression   . Alcohol abuse     6 beers per day; hospital admission in 2009 for withdrawal  symptoms  . Thrombocytopenia   . Tubular adenoma of colon    Past Surgical History:  Past Surgical History  Procedure Laterality Date  . Lumbar fusion  2010  . Vasectomy  1971  . Colonoscopy w/ polypectomy  2011  . Total shoulder arthroplasty  2011    Right-Dr. Tamera Punt  . Colonoscopy  08/22/09    Fields-(Tubular Adenoma)3-mm transverse polyp/4-mm polyp  otherwise noraml/small internal hemorrhoids  . Esophagogastroduodenoscopy [name] 03/05/2014    Procedure: ESOPHAGOGASTRODUODENOSCOPY (EGD);  Surgeon: Danie Binder, MD;  Location: [facility];  Service: Endoscopy;   Laterality: [name];  11:45  . Agile capsule [name] 03/18/2014    Procedure: AGILE CAPSULE;  Surgeon: Danie Binder, MD;   Location: [facility];  Service: Endoscopy;  Laterality: [name];   7:30  . Givens capsule study [name] 03/30/2014    Procedure: GIVENS CAPSULE STUDY;  Surgeon: Danie Binder, MD;   Location: [facility];  Service: Endoscopy;  Laterality: [name];   7:30  . Colonoscopy [name] 04/14/2014    Procedure: COLONOSCOPY;  Surgeon: Danie Binder, MD;   Location: [facility];  Service: Endoscopy;  Laterality: [name];   9:47   HPI:  68 year old male, current smoker with PMH of COPD, alcohol abuse,  chronic bronchitis, TIA, HTN, LBBB who presented to [facility] ED via  EMS 7/9 for progressively worsening SOB and respiratory distress.  Transferred to [facility].  Intubated 7/10-7/17. NSTEMI     Assessment / Plan / Recommendation Clinical Impression  Dysphagia Diagnosis: Moderate oral phase dysphagia;Mild  pharyngeal phase dysphagia Clinical impression: Pt presents with a moderate oral dysphagia  impacting safety with PO. Pt is moderately confused, edentulous  and tremulous with very dry oral mucosa. With most boluses, pt  will briefly hold bolus orally with occasional lingual tremors  prior  to active transit. Intermittently pt has premature spillage  of partial  bolus to pyriforms and sometimes pt actively transits  bolus with a delayed swallow initiation and aspiration before the  swallow. Of note, pt often clears throat post swallow, unrelated  to penetration or aspiration. Initially aspiration event was  silent, but then followed by late, hard, effective cough that  expelled thin aspirate. In some trials swallow was timely,  suggestive of a primary cognitive problem rather than a true  sensory deficit. Strength is WNL and trace residuals coating all  oropahryngeal structures likely due to dry mucosa. Recommend  nectar thick liquids as pt orally manages this bolus best and has  decreased risk of aspiration before the swallow. Will also start  pt with a puree diet, though expect as mentation improves he may  tolerate soft solids and thin liquids well.     Treatment Recommendation  Therapy as outlined in treatment plan below    Diet Recommendation Dysphagia 1 (Puree);Nectar-thick liquid   Liquid Administration via: Cup;Straw Medication Administration: Crushed with puree Supervision: Staff to assist with self feeding Compensations: Slow rate;Small sips/bites Postural Changes and/or Swallow Maneuvers: Seated upright 90  degrees;Upright 30-60 min after meal    Other  Recommendations Oral Care Recommendations: Oral care BID Other Recommendations: Order thickener from pharmacy   Follow Up Recommendations  Skilled Nursing facility    Frequency and Duration min 2x/week  2 weeks   Pertinent Vitals/Pain NA    SLP Swallow Goals     General Date of Onset: 06/03/14 HPI: 68 year old male, current smoker with PMH of COPD, alcohol  abuse, chronic bronchitis, TIA, HTN, LBBB who presented to [facility] ED  via EMS 7/9 for progressively worsening SOB and respiratory  distress. Transferred to [facility].  Intubated 7/10-7/17. NSTEMI Type of Study: Modified Barium Swallowing Study Reason for Referral: Objectively evaluate swallowing function Previous Swallow Assessment: none per records Diet Prior to this  Study: NPO Temperature Spikes Noted: No Respiratory Status: Room air History of Recent Intubation: Yes Length of Intubations (days): 7 days Date extubated: 06/11/14 Behavior/Cognition: Alert;Cooperative;Decreased sustained  attention;Requires cueing;Distractible Oral Cavity - Dentition: Edentulous Oral Motor / Sensory Function: Within functional limits Self-Feeding Abilities: Needs assist Patient Positioning: Upright in chair Baseline Vocal Quality: Breathy;Hoarse;Low vocal intensity Volitional Cough: Strong Volitional Swallow: Able to elicit Anatomy:  (dry oral mucosa) Pharyngeal Secretions: Not observed secondary MBS    Reason for Referral Objectively evaluate swallowing function   Oral Phase Oral Preparation/Oral Phase Oral Phase: Impaired Oral - Nectar Oral - Nectar Cup: Lingual/palatal residue;Delayed oral  transit;Reduced posterior propulsion;Holding of bolus;Lingual  pumping Oral - Nectar Straw: Lingual/palatal residue;Delayed oral  transit;Reduced posterior propulsion;Holding of bolus;Lingual  pumping Oral - Thin Oral - Thin Cup: Lingual/palatal residue;Delayed oral  transit;Reduced posterior propulsion;Holding of bolus;Lingual  pumping Oral - Thin Straw: Lingual/palatal residue;Delayed oral  transit;Reduced posterior propulsion;Holding of bolus;Lingual  pumping Oral - Solids Oral - Puree: Lingual/palatal residue;Delayed oral  transit;Reduced posterior propulsion;Holding of bolus;Lingual  pumping Oral - Mechanical Soft: Lingual/palatal residue;Delayed oral  transit;Reduced posterior propulsion;Holding of bolus;Lingual  pumping;Piecemeal swallowing Oral - Pill: Lingual/palatal residue;Delayed oral transit;Reduced  posterior propulsion;Holding of bolus;Lingual pumping   Pharyngeal Phase Pharyngeal Phase Pharyngeal Phase: Impaired Pharyngeal - Nectar Pharyngeal - Nectar Cup: Delayed swallow initiation;Premature  spillage to valleculae Pharyngeal - Nectar Straw: Delayed swallow initiation;Premature  spillage to  valleculae Pharyngeal - Thin Pharyngeal - Thin Cup: Delayed swallow initiation;Premature  spillage to pyriform sinuses Pharyngeal - Thin Straw: Delayed swallow initiation;Premature  spillage to  pyriform sinuses;Penetration/Aspiration before  swallow;Moderate aspiration Penetration/Aspiration details (thin straw): Material enters  airway, passes BELOW cords without attempt by patient to eject  out (silent aspiration) Pharyngeal - Solids Pharyngeal - Puree: Delayed swallow initiation Pharyngeal - Mechanical Soft: Delayed swallow  initiation;Premature spillage to pyriform sinuses  Cervical Esophageal Phase    GO    Cervical Esophageal Phase Cervical Esophageal Phase: [facility]        Herbie Baltimore, MA CCC-SLP [phone]  DeBlois, Katherene Ponto 06/14/2014, 10:27 AM     Medications: Scheduled Meds: . antiseptic oral rinse  15 mL Mouth Rinse QID  . aspirin  81 mg Oral Daily  . atorvastatin  80 mg Oral q1800  . chlorhexidine  15 mL Mouth Rinse BID  . clopidogrel  75 mg Oral Daily  . feeding supplement (ENSURE)  1 Container Oral BID BM  . folic acid  1 mg Oral Daily  . heparin subcutaneous  5,000 Units Subcutaneous 3 times per day  . insulin aspart  0-15 Units Subcutaneous TID WC  . insulin aspart  0-5 Units Subcutaneous QHS  . metoprolol tartrate  25 mg Oral TID  . multivitamin with minerals  1 tablet Oral Daily  . nicotine  21 mg Transdermal Daily  . risperiDONE  0.5 mg Oral q morning - 10a  . risperiDONE  1 mg Oral QHS  . sodium chloride  3 mL Intravenous Q12H  . thiamine  100 mg Oral Daily      LOS: 16 days   Trusten Hume DO [facility] 06/19/2014, 9:36 AM Pager: [phone]  If 7PM-7AM, please contact night-coverage [email or web address] Password [email or web address]  **Disclaimer: This note was dictated with voice recognition software. Similar sounding words can inadvertently be transcribed and this note may contain transcription errors which may not have been corrected upon publication of note.**

## 2014-06-20 LAB — BASIC METABOLIC PANEL
ANION GAP: 16 — AB (ref 5–15)
BUN: 34 mg/dL — ABNORMAL HIGH (ref 6–23)
CO2: 20 meq/L (ref 19–32)
CREATININE: 1.17 mg/dL (ref 0.50–1.35)
Calcium: 9.3 mg/dL (ref 8.4–10.5)
Chloride: 113 mEq/L — ABNORMAL HIGH (ref 96–112)
GFR calc Af Amer: 73 mL/min — ABNORMAL LOW (ref >90)
GFR calc non Af Amer: 63 mL/min — ABNORMAL LOW (ref >90)
Glucose, Bld: 137 mg/dL — ABNORMAL HIGH (ref 70–99)
Potassium: 3.6 mEq/L — ABNORMAL LOW (ref 3.7–5.3)
Sodium: 149 mEq/L — ABNORMAL HIGH (ref 137–147)

## 2014-06-20 LAB — GLUCOSE, CAPILLARY
GLUCOSE-CAPILLARY: 137 mg/dL — AB (ref 70–99)
GLUCOSE-CAPILLARY: 136 mg/dL — AB (ref 70–99)
Glucose-Capillary: 142 mg/dL — ABNORMAL HIGH (ref 70–99)
Glucose-Capillary: 123 mg/dL — ABNORMAL HIGH (ref 70–99)

## 2014-06-20 MED ORDER — METOPROLOL TARTRATE 12.5 MG HALF TABLET
12.5000 mg | ORAL_TABLET | Freq: Two times a day (BID) | ORAL | Status: DC
  Administered 2014-06-20: 11:00:00 via ORAL
  Administered 2014-06-20 – 2014-06-22 (×4): 12.5 mg via ORAL
  Filled 2014-06-20 (×6): qty 1

## 2014-06-20 NOTE — Progress Notes (Signed)
[facility] Progress Note    Assessment/ Plan:    1. Acute kidney injury - appears to be volume related although certainly possible he has developed ATN since hospitalization. His MAP has also dropped since ~7/18 with intermittent and sometimes persistent hypotensive episodes. -- creatinine peaked at 4.88 on 7/20 but now back down to 1.17 with hydration.  2. Hypernatremia - related to volume status and patient unlikely to have been consuming much free water. Initially his free water deficit was 4.7L. Of note his weight has decreased from 69kg at admission to 58kg to 60.7kg today with an net fluid I&O of +117 during the hospitalization. Part of the problem is the patient's output was not accurate because a foley was only placed on 7/22. - he was finally in the normal range for his SNa on 7/25 and renal function has improved as well; big question now is what will happen if we stop the free water? Question was answered when I decreased d5W to 35ml/hr starting 7/25. His SNa has increased to 149. -  W/i Dr. Eliseo Squires and he needs to be drinking consistently on his own otherwise he will quickly develop a free water deficit. Problem is he's on a thick nectar diet per speech & swallow. Dr. Eliseo Squires is going to call S&S  Back --> if he is not able to have free water he will need maintenance fluids. - change to d5w at 128ml KVO and will recheck in AM.   4. CHF with EF of 20%  5. COPD  6. Confusion   Subjective:     No complaints but still confused.  Moving all four extremities.  UOP 1650 from 7/24-7/25. Over the past 24hrs UOP 91mL.    Objective:   BP 91/56  Pulse 106  Temp(Src) 97.5 F (36.4 C) (Oral)  Resp 18  Ht 5\' 6"  (1.676 m)  Wt 57.2 kg (126 lb 1.7 oz)  BMI 20.36 kg/m2  SpO2 98%  Intake/Output Summary (Last 24 hours) at 06/20/14 0825 Last data filed at 06/19/14 2239  Gross per 24 hour  Intake    420 ml  Output    904 ml  Net   -484 ml   Weight change: -3.9 kg (-8 lb  9.6 oz)  Physical Exam: General appearance: distracted and slowed mentation  Head: Normocephalic, without obvious abnormality, atraumatic  Resp: diminished breath sounds anterior - bilateral and bibasilar  Chest wall: no tenderness  Cardio: S1, S2 normal  GI: soft, non-tender; bowel sounds normal; no masses, no organomegaly  Extremities: extremities normal, atraumatic, no cyanosis or edema  Neurologic: Mental status:confused   Imaging: No results found.  Labs: BMET  Recent Labs Lab 06/16/14 1402 06/17/14 0405 06/17/14 1125 06/17/14 1410 06/18/14 0310 06/19/14 0311 06/20/14 0405  NA 155* 150* 149* 150* 149* 144 149*  K 3.5* 3.1* 3.5* 3.8 3.2* 3.3* 3.6*  CL 116* 114* 112 112 111 108 113*  CO2 21 20 21 22 21 19 20   GLUCOSE 150* 158* 131* 189* 142* 117* 137*  BUN 130* 105* 91* 87* 71* 44* 34*  CREATININE 3.01* 2.43* 2.21* 2.18* 1.83* 1.34 1.17  CALCIUM 9.5 9.2 9.3 9.2 9.2 9.2 9.3   CBC  Recent Labs Lab 06/14/14 0350 06/15/14 0546 06/18/14 0310  WBC 15.5* 13.4* 9.4  HGB 11.9* 10.8* 10.9*  HCT 38.1* 34.4* 34.2*  MCV 90.3 89.4 88.6  PLT 581* 500* 358    Medications:    . antiseptic oral rinse  15 mL Mouth Rinse  QID  . aspirin  81 mg Oral Daily  . atorvastatin  80 mg Oral q1800  . chlorhexidine  15 mL Mouth Rinse BID  . clopidogrel  75 mg Oral Daily  . feeding supplement (ENSURE)  1 Container Oral BID BM  . folic acid  1 mg Oral Daily  . heparin subcutaneous  5,000 Units Subcutaneous 3 times per day  . insulin aspart  0-15 Units Subcutaneous TID WC  . insulin aspart  0-5 Units Subcutaneous QHS  . metoprolol tartrate  12.5 mg Oral BID  . multivitamin with minerals  1 tablet Oral Daily  . nicotine  21 mg Transdermal Daily  . risperiDONE  0.5 mg Oral q morning - 10a  . risperiDONE  1 mg Oral QHS  . sodium chloride  3 mL Intravenous Q12H  . thiamine  100 mg Oral Daily      Otelia Santee, MD 06/20/2014, 8:25 AM

## 2014-06-20 NOTE — Progress Notes (Signed)
Patient ID: Roy [name]  male  QMV:[id number]    DOB: [birth year]    DOA: 06/03/2014  PCP: [name], MD  BRIEF PATIENT DESCRIPTION: 68 y.o. active smoker (120 pack years) with COPD admitted 7/9 with pneumonia and hypoxemic respiratory failure. Initially with elevated troponins (peaked at 4.84) but EKG neg acute MI, CXR with pulm edema and ultimately Found to have severe systolic dysfunction with an EF of 20% with global hypokinesis. He was intubated and treated for AECOPD +/- PNA with IV steroids, empiric IV abx for CAP, Nebulized BDs, diuresis and cardiology was consulted. He was successfully extubated 7/17 and he was continued on BD's and aggressive pulm hygiene. He improved with abx and diuresis and and cardiology followed closely for NSTEMI for medical management with asa, plavix, B blocker, statin. Initial cardiology plan was for cardiac cath but course was c/b ETOH withdrawal and metabolic encephalopathy and cath was deferred in setting confusion. At this time he is overall much improved, remains moderately confused but improving. Passed swallow eval with only mild aspiration and is ok for PO diet per speech therapy. He needs continued rehab efforts and plan is for d/c to SNF [facility]) when medically ready.    Assessment/Plan:   Acute respiratory failure - resolved: multifactorial likely due to acute CHF, community acquired pneumonia, COPD exacerbation - O2 sats 99% on room air  Acute encephalopathy:  - Possibly multifactorial due to metabolic encephalopathy from hypernatremia, uremia, alcohol withdrawal - Continue correcting metabolic abnormalities, hypernatremia, acute kidney injury -d/c ATC ativan-- MUCH more awake now -head CT ok    Tobacco abuse - Continue nicotine patch    Alcohol withdrawal delirium - required Precedex drip and BZD's in the ICU    Pneumonia, organism unspecified - Has completed  course of Zithromax and Rocephin    NSTEMI (non-ST elevated myocardial  infarction) with acute systolic CHF - EF 28%, possibly ischemic, cardiology following, recommended medical management at this time, not a candidate for cardiac cath given his renal failure - Not a candidate for ACE/ARB due to acute renal insufficiency    COPD exacerbation - Stable, now improving     Hypernatremia With Acute renal :   - Nephrology following, will defer to renal for management -improved- will ask SLP to see if he can have thin liquids as he needs free water to lower Na  Diarrhea: C. difficile negative, improving  Hypokalemia -replete      Code Status: full  Family Communication: wife at bedside Friday  Disposition: SNF on Monday  Consultants:  Cardiology   Nephrology   Procedures:  None   Antibiotics:     Subjective: No overnight events  Objective: Weight change: -3.9 kg (-8 lb 9.6 oz)  Intake/Output Summary (Last 24 hours) at 06/20/14 0815 Last data filed at 06/19/14 2239  Gross per 24 hour  Intake    300 ml  Output    904 ml  Net   -604 ml   Blood pressure 91/56, pulse 106, temperature 97.5 F (36.4 C), temperature source Oral, resp. rate 18, height 5\' 6"  (1.676 m), weight 57.2 kg (126 lb 1.7 oz), SpO2 98.00%.  Physical Exam: General: awake, following commands CVS: S1-S2 clear, no murmur rubs or gallops Chest: clear to auscultation bilaterally, no wheezing, rales or rhonchi Abdomen: soft nontender, nondistended, normal bowel sounds  Extremities: no cyanosis, clubbing or edema noted bilaterally   Lab Results: Basic Metabolic Panel:  Recent Labs Lab 06/14/14 0350  06/19/14 0311 06/20/14 0405  NA 156*  < > 144 149*  K 3.8  < > 3.3* 3.6*  CL 107  < > 108 113*  CO2 22  < > 19 20  GLUCOSE 152*  < > 117* 137*  BUN 177*  < > 44* 34*  CREATININE 4.60*  < > 1.34 1.17  CALCIUM 9.9  < > 9.2 9.3  MG 3.6*  --   --   --   < > = values in this interval not displayed. Liver Function Tests: No results found for this basename: AST,  ALT, ALKPHOS, BILITOT, PROT, ALBUMIN,  in the last 168 hours No results found for this basename: LIPASE, AMYLASE,  in the last 168 hours  Recent Labs Lab 06/16/14 0041  AMMONIA 28   CBC:  Recent Labs Lab 06/15/14 0546 06/18/14 0310  WBC 13.4* 9.4  HGB 10.8* 10.9*  HCT 34.4* 34.2*  MCV 89.4 88.6  PLT 500* 358   Cardiac Enzymes: No results found for this basename: CKTOTAL, CKMB, CKMBINDEX, TROPONINI,  in the last 168 hours BNP: No components found with this basename: POCBNP,  CBG:  Recent Labs Lab 06/19/14 0554 06/19/14 1122 06/19/14 1621 06/19/14 2207 06/20/14 0807  GLUCAP 130* 126* 114* 129* 142*     Micro Results: Recent Results (from the past 240 hour(s))  STOOL CULTURE     Status: None   Collection Time    06/15/14  7:24 AM      Result Value Ref Range Status   Specimen Description STOOL   Final   Special Requests Normal   Final   Culture     Final   Value: NO SALMONELLA, SHIGELLA, CAMPYLOBACTER, YERSINIA, OR E.COLI 0157:H7 ISOLATED     Note: REDUCED NORMAL FLORA PRESENT     Performed at [facility]   Report Status 06/19/2014 FINAL   Final  CLOSTRIDIUM DIFFICILE BY PCR     Status: None   Collection Time    06/15/14  7:24 AM      Result Value Ref Range Status   C difficile by pcr NEGATIVE  NEGATIVE Final    Studies/Results: Dg Chest Port 1 View  06/11/2014   CLINICAL DATA:  Respiratory distress.  Intubated patient.  EXAM: PORTABLE CHEST - 1 VIEW  COMPARISON:  Single view of the chest 06/10/2014 and 06/09/2014.  FINDINGS: ET tube remains in place and projects in good position. NG tube is seen with the side port at the gastroesophageal junction. Recommend advancement of 4-5 cm. Lungs are clear. Heart size is normal. No pneumothorax or pleural effusion. Right shoulder replacement noted.  IMPRESSION: Side port of the NG tube is at the gastroesophageal junction. Recommend advancement 4-5 cm.  ET tube in good position.  Lungs clear.   Electronically  Signed   By: Inge Rise M.D.   On: 06/11/2014 08:00   Dg Chest Port 1 View  06/10/2014   CLINICAL DATA:  Assess endotracheal tube  EXAM: PORTABLE CHEST - 1 VIEW  COMPARISON:  06/09/2014  FINDINGS: There is an endotracheal to which ends between the clavicles and carina. An orogastric tube enters the stomach.  Normal heart size and mediastinal contours for technique.  Improved basilar lung aeration, with the left diaphragm now visible. No effusion or pneumothorax.  IMPRESSION: 1. Well-positioned endotracheal and orogastric tubes. 2. Continued improvement in basilar lung aeration.   Electronically Signed   By: Jorje Guild M.D.   On: 06/10/2014 06:44   Dg Chest Port 1 View  06/09/2014  CLINICAL DATA:  Respiratory failure.  EXAM: PORTABLE CHEST - 1 VIEW  COMPARISON:  06/08/2014  FINDINGS: Endotracheal tube tip is approximately 4 cm above the carina. Lungs show improved aeration at the left base with mild residual atelectasis remaining. There is no evidence of pulmonary edema, consolidation, pneumothorax, nodule or pleural fluid. The heart size and mediastinal contours are within normal limits. Nasogastric tube continues to extend below the diaphragm.  IMPRESSION: Improved aeration at the left lung base.   Electronically Signed   By: Aletta Edouard M.D.   On: 06/09/2014 07:44   Dg Chest Port 1 View  06/08/2014   CLINICAL DATA:  COPD.  Respiratory failure.  EXAM: PORTABLE CHEST - 1 VIEW  COMPARISON:  Single view of the chest 06/07/2014 and 06/05/2014.  FINDINGS: Support tubes and lines are unchanged. No pneumothorax is identified. Interstitial edema seen on yesterday's examination continues to improve. There is some new subsegmental atelectasis in the left lung base. Heart size is normal.  IMPRESSION: Continued improvement in interstitial pulmonary edema.  Increased subsegmental atelectasis left lung base.   Electronically Signed   By: Inge Rise M.D.   On: 06/08/2014 08:08   Dg Chest Port 1  View  06/07/2014   CLINICAL DATA:  Endotracheal tube.  EXAM: PORTABLE CHEST - 1 VIEW  COMPARISON:  06/05/2014.  FINDINGS: Endotracheal tube ends at the level of the mid thoracic trachea. A new orogastric tube enters the stomach at least. Interval decrease in diffuse interstitial and airspace opacities. No evidence of pneumothorax or increasing pleural fluid. Right glenohumeral arthroplasty noted.  IMPRESSION: 1. Endotracheal and orogastric tubes are in good position. 2. Decreasing pulmonary edema.   Electronically Signed   By: Jorje Guild M.D.   On: 06/07/2014 06:49   Portable Chest Xray  06/06/2014   CLINICAL DATA:  Endotracheal tube placement.  EXAM: PORTABLE CHEST - 1 VIEW  COMPARISON:  06/05/2014  FINDINGS: Interval placement of an endotracheal tube with tip measuring 4.3 cm above the carinal. Shallow inspiration. Mild cardiac enlargement with diffuse bilateral pulmonary infiltration, likely edema. Postoperative change in the right shoulder. No blunting of costophrenic angles. No pneumothorax.  IMPRESSION: Endotracheal tube tip measures 4.3 cm above the carina. Diffuse bilateral pulmonary parenchymal infiltrates again demonstrated.   Electronically Signed   By: Lucienne Capers M.D.   On: 06/06/2014 00:29   Dg Chest Port 1 View  06/05/2014   CLINICAL DATA:  Pulmonary edema.  EXAM: PORTABLE CHEST - 1 VIEW  COMPARISON:  06/03/2014  FINDINGS: Bilateral pulmonary edema has increased diffusely on the left and in the right upper lobe. Edema at the right base appears slightly improved.  Small right effusion. Heart size is within normal limits. No acute osseous abnormality.  IMPRESSION: Pulmonary edema has slightly increased.   Electronically Signed   By: Rozetta Nunnery M.D.   On: 06/05/2014 08:15   Dg Chest Portable 1 View  06/03/2014   CLINICAL DATA:  Respiratory distress.  EXAM: PORTABLE CHEST - 1 VIEW  COMPARISON:  Chest x-ray 09/25/2010 .  FINDINGS: Cardiomegaly with pulmonary vascular prominence and  interstitial prominence present consistent we congestive heart failure and pulmonary edema. No focal alveolar infiltrate. No pleural effusion or pneumothorax. Right shoulder replacement.  IMPRESSION: Congestive heart failure with pulmonary interstitial edema.   Electronically Signed   By: Marcello Moores  Register   On: 06/03/2014 10:09   Dg Abd Portable 1v  06/06/2014   CLINICAL DATA:  OG tube placement  EXAM: PORTABLE ABDOMEN - 1 VIEW  COMPARISON:  03/19/2014; 10/09/2012; CTA - 02/17/2014  FINDINGS: Interval placement of enteric tube with tip and side port overlying the expected location of the mid body of the stomach.  There is moderate gas distention of the colon without definitive gas distention of the upstream small bowel. No supine evidence of pneumoperitoneum. No definite pneumatosis or portal venous gas.  There has been interval passage of previously ingested endoscopy capsule.  Vascular calcifications.  IMPRESSION: 1. Enteric tube tip and side port projects over the mid body of the stomach. 2. Nonspecific mild gas distention of the colon could be indicative of mild ileus.   Electronically Signed   By: Sandi Mariscal M.D.   On: 06/06/2014 13:37   Dg Swallowing Func-speech Pathology  06/14/2014   Katherene Ponto Deblois, CCC-SLP     06/14/2014 10:32 AM Objective Swallowing Evaluation: Modified Barium Swallowing Study   Patient Details  Name: Roy [name] MRN: [id number] Date of Birth: [birth year]  Today's Date: 06/14/2014 Time: 0950-1015 SLP Time Calculation (min): 25 min  Past Medical History:  Past Medical History  Diagnosis Date  . Cerebrovascular disease 2009    TIA; 2009- right ICA stent; re-intervention for restenosis  complicated by [facility] w/o sx  . Degenerative joint disease     Total shoulder arthroplasty-right  . LBBB (left bundle branch block)     Normal echo-2011; stress nuclear in 09/2010--septal  hypoperfusion representing nontransmural infarction or the effect  of left bundle branch block, no ischemia  .  Hyperlipidemia   . Hypertension   . Chronic obstructive pulmonary disease   . Tobacco abuse     -100 pack years; 1.5 packs per day  . Anxiety and depression   . Alcohol abuse     6 beers per day; hospital admission in 2009 for withdrawal  symptoms  . Thrombocytopenia   . Tubular adenoma of colon    Past Surgical History:  Past Surgical History  Procedure Laterality Date  . Lumbar fusion  2010  . Vasectomy  1971  . Colonoscopy w/ polypectomy  2011  . Total shoulder arthroplasty  2011    Right-Dr. Tamera Punt  . Colonoscopy  08/22/09    Fields-(Tubular Adenoma)3-mm transverse polyp/4-mm polyp  otherwise noraml/small internal hemorrhoids  . Esophagogastroduodenoscopy [name] 03/05/2014    Procedure: ESOPHAGOGASTRODUODENOSCOPY (EGD);  Surgeon: Danie Binder, MD;  [facility]: [facility];  Service: Endoscopy;   Laterality: [name];  11:45  . Agile capsule [name] 03/18/2014    Procedure: AGILE CAPSULE;  Surgeon: Danie Binder, MD;   Location: [facility];  Service: Endoscopy;  Laterality: [name];   7:30  . Givens capsule study [name] 03/30/2014    Procedure: GIVENS CAPSULE STUDY;  Surgeon: Danie Binder, MD;   Location: [facility];  Service: Endoscopy;  Laterality: [name];   7:30  . Colonoscopy [name] 04/14/2014    Procedure: COLONOSCOPY;  Surgeon: Danie Binder, MD;   Location: [facility];  Service: Endoscopy;  Laterality: [name];   9:73   HPI:  68 year old male, current smoker with PMH of COPD, alcohol abuse,  chronic bronchitis, TIA, HTN, LBBB who presented to [facility] ED via  EMS 7/9 for progressively worsening SOB and respiratory distress.  Transferred to [facility]  Intubated 7/10-7/17. NSTEMI     Assessment / Plan / Recommendation Clinical Impression  Dysphagia Diagnosis: Moderate oral phase dysphagia;Mild  pharyngeal phase dysphagia Clinical impression: Pt presents with a moderate oral dysphagia  impacting safety with PO. Pt is moderately confused, edentulous  and tremulous with very dry oral mucosa. With most boluses, pt  will briefly hold  bolus orally with occasional lingual tremors  prior to active transit. Intermittently pt has premature spillage  of partial bolus to pyriforms and sometimes pt actively transits  bolus with a delayed swallow initiation and aspiration before the  swallow. Of note, pt often clears throat post swallow, unrelated  to penetration or aspiration. Initially aspiration event was  silent, but then followed by late, hard, effective cough that  expelled thin aspirate. In some trials swallow was timely,  suggestive of a primary cognitive problem rather than a true  sensory deficit. Strength is WNL and trace residuals coating all  oropahryngeal structures likely due to dry mucosa. Recommend  nectar thick liquids as pt orally manages this bolus best and has  decreased risk of aspiration before the swallow. Will also start  pt with a puree diet, though expect as mentation improves he may  tolerate soft solids and thin liquids well.     Treatment Recommendation  Therapy as outlined in treatment plan below    Diet Recommendation Dysphagia 1 (Puree);Nectar-thick liquid   Liquid Administration via: Cup;Straw Medication Administration: Crushed with puree Supervision: Staff to assist with self feeding Compensations: Slow rate;Small sips/bites Postural Changes and/or Swallow Maneuvers: Seated upright 90  degrees;Upright 30-60 min after meal    Other  Recommendations Oral Care Recommendations: Oral care BID Other Recommendations: Order thickener from pharmacy   Follow Up Recommendations  Skilled Nursing facility    Frequency and Duration min 2x/week  2 weeks   Pertinent Vitals/Pain NA    SLP Swallow Goals     General Date of Onset: 06/03/14 HPI: 68 year old male, current smoker with PMH of COPD, alcohol  abuse, chronic bronchitis, TIA, HTN, LBBB who presented to [facility] ED  via EMS 7/9 for progressively worsening SOB and respiratory  distress. Transferred to [facility]  Intubated 7/10-7/17. NSTEMI Type of Study: Modified Barium Swallowing Study  Reason for Referral: Objectively evaluate swallowing function Previous Swallow Assessment: none per records Diet Prior to this Study: NPO Temperature Spikes Noted: No Respiratory Status: Room air History of Recent Intubation: Yes Length of Intubations (days): 7 days Date extubated: 06/11/14 Behavior/Cognition: Alert;Cooperative;Decreased sustained  attention;Requires cueing;Distractible Oral Cavity - Dentition: Edentulous Oral Motor / Sensory Function: Within functional limits Self-Feeding Abilities: Needs assist Patient Positioning: Upright in chair Baseline Vocal Quality: Breathy;Hoarse;Low vocal intensity Volitional Cough: Strong Volitional Swallow: Able to elicit Anatomy:  (dry oral mucosa) Pharyngeal Secretions: Not observed secondary MBS    Reason for Referral Objectively evaluate swallowing function   Oral Phase Oral Preparation/Oral Phase Oral Phase: Impaired Oral - Nectar Oral - Nectar Cup: Lingual/palatal residue;Delayed oral  transit;Reduced posterior propulsion;Holding of bolus;Lingual  pumping Oral - Nectar Straw: Lingual/palatal residue;Delayed oral  transit;Reduced posterior propulsion;Holding of bolus;Lingual  pumping Oral - Thin Oral - Thin Cup: Lingual/palatal residue;Delayed oral  transit;Reduced posterior propulsion;Holding of bolus;Lingual  pumping Oral - Thin Straw: Lingual/palatal residue;Delayed oral  transit;Reduced posterior propulsion;Holding of bolus;Lingual  pumping Oral - Solids Oral - Puree: Lingual/palatal residue;Delayed oral  transit;Reduced posterior propulsion;Holding of bolus;Lingual  pumping Oral - Mechanical Soft: Lingual/palatal residue;Delayed oral  transit;Reduced posterior propulsion;Holding of bolus;Lingual  pumping;Piecemeal swallowing Oral - Pill: Lingual/palatal residue;Delayed oral transit;Reduced  posterior propulsion;Holding of bolus;Lingual pumping   Pharyngeal Phase Pharyngeal Phase Pharyngeal Phase: Impaired Pharyngeal - Nectar Pharyngeal - Nectar Cup: Delayed  swallow initiation;Premature  spillage to valleculae Pharyngeal - Nectar Straw: Delayed swallow initiation;Premature  spillage to valleculae Pharyngeal -  Thin Pharyngeal - Thin Cup: Delayed swallow initiation;Premature  spillage to pyriform sinuses Pharyngeal - Thin Straw: Delayed swallow initiation;Premature  spillage to pyriform sinuses;Penetration/Aspiration before  swallow;Moderate aspiration Penetration/Aspiration details (thin straw): Material enters  airway, passes BELOW cords without attempt by patient to eject  out (silent aspiration) Pharyngeal - Solids Pharyngeal - Puree: Delayed swallow initiation Pharyngeal - Mechanical Soft: Delayed swallow  initiation;Premature spillage to pyriform sinuses  Cervical Esophageal Phase    GO    Cervical Esophageal Phase Cervical Esophageal Phase: [facility]        Herbie Baltimore, MA CCC-SLP [phone]  DeBlois, Katherene Ponto 06/14/2014, 10:27 AM     Medications: Scheduled Meds: . antiseptic oral rinse  15 mL Mouth Rinse QID  . aspirin  81 mg Oral Daily  . atorvastatin  80 mg Oral q1800  . chlorhexidine  15 mL Mouth Rinse BID  . clopidogrel  75 mg Oral Daily  . feeding supplement (ENSURE)  1 Container Oral BID BM  . folic acid  1 mg Oral Daily  . heparin subcutaneous  5,000 Units Subcutaneous 3 times per day  . insulin aspart  0-15 Units Subcutaneous TID WC  . insulin aspart  0-5 Units Subcutaneous QHS  . metoprolol tartrate  25 mg Oral TID  . multivitamin with minerals  1 tablet Oral Daily  . nicotine  21 mg Transdermal Daily  . risperiDONE  0.5 mg Oral q morning - 10a  . risperiDONE  1 mg Oral QHS  . sodium chloride  3 mL Intravenous Q12H  . thiamine  100 mg Oral Daily      LOS: 17 days   VANN, JESSICA DO [facility] 06/20/2014, 8:15 AM Pager: [phone]  If 7PM-7AM, please contact night-coverage [email or web address] Password [email or web address]  **Disclaimer: This note was dictated with voice recognition software. Similar sounding words can inadvertently be  transcribed and this note may contain transcription errors which may not have been corrected upon publication of note.**

## 2014-06-21 LAB — BASIC METABOLIC PANEL
Anion gap: 15 (ref 5–15)
BUN: 21 mg/dL (ref 6–23)
CO2: 22 mEq/L (ref 19–32)
CREATININE: 0.9 mg/dL (ref 0.50–1.35)
Calcium: 8.7 mg/dL (ref 8.4–10.5)
Chloride: 105 mEq/L (ref 96–112)
GFR calc non Af Amer: 86 mL/min — ABNORMAL LOW (ref >90)
Glucose, Bld: 136 mg/dL — ABNORMAL HIGH (ref 70–99)
POTASSIUM: 3.1 meq/L — AB (ref 3.7–5.3)
Sodium: 142 mEq/L (ref 137–147)

## 2014-06-21 LAB — GLUCOSE, CAPILLARY
GLUCOSE-CAPILLARY: 118 mg/dL — AB (ref 70–99)
Glucose-Capillary: 148 mg/dL — ABNORMAL HIGH (ref 70–99)
Glucose-Capillary: 119 mg/dL — ABNORMAL HIGH (ref 70–99)

## 2014-06-21 MED ORDER — POTASSIUM CHLORIDE 20 MEQ/15ML (10%) PO LIQD
40.0000 meq | Freq: Once | ORAL | Status: AC
  Administered 2014-06-21: 40 meq via ORAL
  Filled 2014-06-21: qty 30

## 2014-06-21 NOTE — Progress Notes (Signed)
Speech Language Pathology Treatment: Dysphagia  Patient Details Name: Roy [name] MRN: [id number] DOB: 1946-01-05 Today's Date: 06/21/2014 Time: 9244-6286 SLP Time Calculation (min): 14 min  Assessment / Plan / Recommendation Clinical Impression  Order received from MD to assess pt. for thin liquids/water protocol.  Oral cavity cleaned prior to small cups sips thin water with watery eyes and suspected swallow initiation delays.  Known aspiration risk with thin water, however if oral cavity cleaned without significant bacteria, pt.'s lungs can tolerate water versus liquids containing additives/perservatives etc.  Pt. May benefit from repeat MBS to reassess swallow function.  Continue Dys 1 diet and nectar thick liquids, thin water only after the following guidelines (oral care prior to water, no thin water during meals, no thin water with meds, wait 30 min after meals to give thin water).   HPI HPI: 68 year old male, current smoker with PMH of COPD, alcohol abuse, chronic bronchitis, TIA, HTN, LBBB who presented to [facility] ED via EMS 7/9 for progressively worsening SOB and respiratory distress. Transferred to [facility].  Intubated 7/10-7/17. NSTEMI   Pertinent Vitals WDL  SLP Plan  Continue with current plan of care    Recommendations Diet recommendations: Dysphagia 1 (puree);Nectar-thick liquid (Frazier water protocol) Liquids provided via: Cup;No straw Medication Administration: Whole meds with puree Supervision: Staff to assist with self feeding;Patient able to self feed;Full supervision/cueing for compensatory strategies Compensations: Slow rate;Small sips/bites Postural Changes and/or Swallow Maneuvers: Seated upright 90 degrees;Upright 30-60 min after meal              Oral Care Recommendations: Oral care BID Follow up Recommendations: Skilled Nursing facility Plan: Continue with current plan of care    GO     Houston Siren M.Ed [employer] [phone]  06/21/2014

## 2014-06-21 NOTE — Progress Notes (Signed)
UR completed Lizette Pazos K. Zeya Balles, RN, BSN, MSHL, CCM  06/21/2014 4:12 PM

## 2014-06-21 NOTE — Progress Notes (Signed)
Patient ID: Roy [name]  male  IWP:[id number]    DOB: [birth year]    DOA: 06/03/2014  PCP: [name], MD  BRIEF PATIENT DESCRIPTION: 68 y.o. active smoker (120 pack years) with COPD admitted 7/9 with pneumonia and hypoxemic respiratory failure. Initially with elevated troponins (peaked at 4.84) but EKG neg acute MI, CXR with pulm edema and ultimately Found to have severe systolic dysfunction with an EF of 20% with global hypokinesis. He was intubated and treated for AECOPD +/- PNA with IV steroids, empiric IV abx for CAP, Nebulized BDs, diuresis and cardiology was consulted. He was successfully extubated 7/17 and he was continued on BD's and aggressive pulm hygiene. He improved with abx and diuresis and and cardiology followed closely for NSTEMI for medical management with asa, plavix, B blocker, statin. Initial cardiology plan was for cardiac cath but course was c/b ETOH withdrawal and metabolic encephalopathy and cath was deferred in setting confusion. At this time he is overall much improved, remains moderately confused but improving. Passed swallow eval with only mild aspiration and is ok for PO diet per speech therapy. He needs continued rehab efforts and plan is for d/c to SNF [facility]) when medically ready.  -has been unable to keep Na down as he has not been able to drink water- on thickened liquids  Assessment/Plan:   Acute respiratory failure - resolved: multifactorial likely due to acute CHF, community acquired pneumonia, COPD exacerbation - O2 sats 99% on room air  Acute encephalopathy:  - Possibly multifactorial due to metabolic encephalopathy from hypernatremia, uremia, alcohol withdrawal - Continue correcting metabolic abnormalities, hypernatremia, acute kidney injury -d/c ATC ativan-- MUCH more awake now -head CT ok    Tobacco abuse - Continue nicotine patch    Alcohol withdrawal delirium - required Precedex drip and BZD's in the ICU    Pneumonia, organism  unspecified - Has completed  course of Zithromax and Rocephin    NSTEMI (non-ST elevated myocardial infarction) with acute systolic CHF - EF 50%, possibly ischemic, cardiology following, recommended medical management at this time, not a candidate for cardiac cath given his renal failure - Not a candidate for ACE/ARB due to acute renal insufficiency    COPD exacerbation - Stable, now improving     Hypernatremia With Acute renal :   - Nephrology following, will defer to renal for management -improved- will ask SLP to see if he can have thin liquids as he needs free water to lower Na -is on D5 per renal-- needs to be able to maintain on own before d/c to SNF  Diarrhea: C. difficile negative, improving  Hypokalemia -replete      Code Status: full  Family Communication: wife at bedside Friday  Disposition: SNF once able to keep Na down  Consultants:  Cardiology   Nephrology   Procedures:  None   Antibiotics:     Subjective: No overnight events Eating breakfast well   Objective: Weight change: 3.4 kg (7 lb 7.9 oz)  Intake/Output Summary (Last 24 hours) at 06/21/14 1038 Last data filed at 06/21/14 1036  Gross per 24 hour  Intake    600 ml  Output    652 ml  Net    -52 ml   Blood pressure 110/67, pulse 90, temperature 97.8 F (36.6 C), temperature source Oral, resp. rate 22, height 5\' 6"  (1.676 m), weight 60.6 kg (133 lb 9.6 oz), SpO2 100.00%.  Physical Exam: General: awake, following commands CVS: S1-S2 clear, no murmur rubs or gallops  Chest: clear to auscultation bilaterally, no wheezing, rales or rhonchi Abdomen: soft nontender, nondistended, normal bowel sounds  Extremities: no cyanosis, clubbing or edema noted bilaterally   Lab Results: Basic Metabolic Panel:  Recent Labs Lab 06/20/14 0405 06/21/14 0653  NA 149* 142  K 3.6* 3.1*  CL 113* 105  CO2 20 22  GLUCOSE 137* 136*  BUN 34* 21  CREATININE 1.17 0.90  CALCIUM 9.3 8.7   Liver  Function Tests: No results found for this basename: AST, ALT, ALKPHOS, BILITOT, PROT, ALBUMIN,  in the last 168 hours No results found for this basename: LIPASE, AMYLASE,  in the last 168 hours  Recent Labs Lab 06/16/14 0041  AMMONIA 28   CBC:  Recent Labs Lab 06/15/14 0546 06/18/14 0310  WBC 13.4* 9.4  HGB 10.8* 10.9*  HCT 34.4* 34.2*  MCV 89.4 88.6  PLT 500* 358   Cardiac Enzymes: No results found for this basename: CKTOTAL, CKMB, CKMBINDEX, TROPONINI,  in the last 168 hours BNP: No components found with this basename: POCBNP,  CBG:  Recent Labs Lab 06/20/14 0807 06/20/14 1140 06/20/14 1626 06/20/14 2218 06/21/14 0715  GLUCAP 142* 137* 136* 123* 148*     Micro Results: Recent Results (from the past 240 hour(s))  STOOL CULTURE     Status: None   Collection Time    06/15/14  7:24 AM      Result Value Ref Range Status   Specimen Description STOOL   Final   Special Requests Normal   Final   Culture     Final   Value: NO SALMONELLA, SHIGELLA, CAMPYLOBACTER, YERSINIA, OR E.COLI 0157:H7 ISOLATED     Note: REDUCED NORMAL FLORA PRESENT     Performed at [facility]   Report Status 06/19/2014 FINAL   Final  CLOSTRIDIUM DIFFICILE BY PCR     Status: None   Collection Time    06/15/14  7:24 AM      Result Value Ref Range Status   C difficile by pcr NEGATIVE  NEGATIVE Final    Studies/Results: Dg Chest Port 1 View  06/11/2014   CLINICAL DATA:  Respiratory distress.  Intubated patient.  EXAM: PORTABLE CHEST - 1 VIEW  COMPARISON:  Single view of the chest 06/10/2014 and 06/09/2014.  FINDINGS: ET tube remains in place and projects in good position. NG tube is seen with the side port at the gastroesophageal junction. Recommend advancement of 4-5 cm. Lungs are clear. Heart size is normal. No pneumothorax or pleural effusion. Right shoulder replacement noted.  IMPRESSION: Side port of the NG tube is at the gastroesophageal junction. Recommend advancement 4-5 cm.  ET  tube in good position.  Lungs clear.   Electronically Signed   By: Inge Rise M.D.   On: 06/11/2014 08:00   Dg Chest Port 1 View  06/10/2014   CLINICAL DATA:  Assess endotracheal tube  EXAM: PORTABLE CHEST - 1 VIEW  COMPARISON:  06/09/2014  FINDINGS: There is an endotracheal to which ends between the clavicles and carina. An orogastric tube enters the stomach.  Normal heart size and mediastinal contours for technique.  Improved basilar lung aeration, with the left diaphragm now visible. No effusion or pneumothorax.  IMPRESSION: 1. Well-positioned endotracheal and orogastric tubes. 2. Continued improvement in basilar lung aeration.   Electronically Signed   By: Jorje Guild M.D.   On: 06/10/2014 06:44   Dg Chest Port 1 View  06/09/2014   CLINICAL DATA:  Respiratory failure.  EXAM: PORTABLE CHEST -  1 VIEW  COMPARISON:  06/08/2014  FINDINGS: Endotracheal tube tip is approximately 4 cm above the carina. Lungs show improved aeration at the left base with mild residual atelectasis remaining. There is no evidence of pulmonary edema, consolidation, pneumothorax, nodule or pleural fluid. The heart size and mediastinal contours are within normal limits. Nasogastric tube continues to extend below the diaphragm.  IMPRESSION: Improved aeration at the left lung base.   Electronically Signed   By: Aletta Edouard M.D.   On: 06/09/2014 07:44   Dg Chest Port 1 View  06/08/2014   CLINICAL DATA:  COPD.  Respiratory failure.  EXAM: PORTABLE CHEST - 1 VIEW  COMPARISON:  Single view of the chest 06/07/2014 and 06/05/2014.  FINDINGS: Support tubes and lines are unchanged. No pneumothorax is identified. Interstitial edema seen on yesterday's examination continues to improve. There is some new subsegmental atelectasis in the left lung base. Heart size is normal.  IMPRESSION: Continued improvement in interstitial pulmonary edema.  Increased subsegmental atelectasis left lung base.   Electronically Signed   By: Inge Rise M.D.   On: 06/08/2014 08:08   Dg Chest Port 1 View  06/07/2014   CLINICAL DATA:  Endotracheal tube.  EXAM: PORTABLE CHEST - 1 VIEW  COMPARISON:  06/05/2014.  FINDINGS: Endotracheal tube ends at the level of the mid thoracic trachea. A new orogastric tube enters the stomach at least. Interval decrease in diffuse interstitial and airspace opacities. No evidence of pneumothorax or increasing pleural fluid. Right glenohumeral arthroplasty noted.  IMPRESSION: 1. Endotracheal and orogastric tubes are in good position. 2. Decreasing pulmonary edema.   Electronically Signed   By: Jorje Guild M.D.   On: 06/07/2014 06:49   Portable Chest Xray  06/06/2014   CLINICAL DATA:  Endotracheal tube placement.  EXAM: PORTABLE CHEST - 1 VIEW  COMPARISON:  06/05/2014  FINDINGS: Interval placement of an endotracheal tube with tip measuring 4.3 cm above the carinal. Shallow inspiration. Mild cardiac enlargement with diffuse bilateral pulmonary infiltration, likely edema. Postoperative change in the right shoulder. No blunting of costophrenic angles. No pneumothorax.  IMPRESSION: Endotracheal tube tip measures 4.3 cm above the carina. Diffuse bilateral pulmonary parenchymal infiltrates again demonstrated.   Electronically Signed   By: Lucienne Capers M.D.   On: 06/06/2014 00:29   Dg Chest Port 1 View  06/05/2014   CLINICAL DATA:  Pulmonary edema.  EXAM: PORTABLE CHEST - 1 VIEW  COMPARISON:  06/03/2014  FINDINGS: Bilateral pulmonary edema has increased diffusely on the left and in the right upper lobe. Edema at the right base appears slightly improved.  Small right effusion. Heart size is within normal limits. No acute osseous abnormality.  IMPRESSION: Pulmonary edema has slightly increased.   Electronically Signed   By: Rozetta Nunnery M.D.   On: 06/05/2014 08:15   Dg Chest Portable 1 View  06/03/2014   CLINICAL DATA:  Respiratory distress.  EXAM: PORTABLE CHEST - 1 VIEW  COMPARISON:  Chest x-ray 09/25/2010 .   FINDINGS: Cardiomegaly with pulmonary vascular prominence and interstitial prominence present consistent we congestive heart failure and pulmonary edema. No focal alveolar infiltrate. No pleural effusion or pneumothorax. Right shoulder replacement.  IMPRESSION: Congestive heart failure with pulmonary interstitial edema.   Electronically Signed   By: Marcello Moores  Register   On: 06/03/2014 10:09   Dg Abd Portable 1v  06/06/2014   CLINICAL DATA:  OG tube placement  EXAM: PORTABLE ABDOMEN - 1 VIEW  COMPARISON:  03/19/2014; 10/09/2012; CTA - 02/17/2014  FINDINGS:  Interval placement of enteric tube with tip and side port overlying the expected location of the mid body of the stomach.  There is moderate gas distention of the colon without definitive gas distention of the upstream small bowel. No supine evidence of pneumoperitoneum. No definite pneumatosis or portal venous gas.  There has been interval passage of previously ingested endoscopy capsule.  Vascular calcifications.  IMPRESSION: 1. Enteric tube tip and side port projects over the mid body of the stomach. 2. Nonspecific mild gas distention of the colon could be indicative of mild ileus.   Electronically Signed   By: Sandi Mariscal M.D.   On: 06/06/2014 13:37   Dg Swallowing Func-speech Pathology  06/14/2014   Katherene Ponto Deblois, CCC-SLP     06/14/2014 10:32 AM Objective Swallowing Evaluation: Modified Barium Swallowing Study   Patient Details  Name: XAIDYN KEPNER MRN: [id number] Date of Birth: Sep 06, 1946  Today's Date: 06/14/2014 Time: 0950-1015 SLP Time Calculation (min): 25 min  Past Medical History:  Past Medical History  Diagnosis Date  . Cerebrovascular disease 2009    TIA; 2009- right ICA stent; re-intervention for restenosis  complicated by [facility] w/o sx  . Degenerative joint disease     Total shoulder arthroplasty-right  . LBBB (left bundle branch block)     Normal echo-2011; stress nuclear in 09/2010--septal  hypoperfusion representing nontransmural  infarction or the effect  of left bundle branch block, no ischemia  . Hyperlipidemia   . Hypertension   . Chronic obstructive pulmonary disease   . Tobacco abuse     -100 pack years; 1.5 packs per day  . Anxiety and depression   . Alcohol abuse     6 beers per day; hospital admission in 2009 for withdrawal  symptoms  . Thrombocytopenia   . Tubular adenoma of colon    Past Surgical History:  Past Surgical History  Procedure Laterality Date  . Lumbar fusion  2010  . Vasectomy  1971  . Colonoscopy w/ polypectomy  2011  . Total shoulder arthroplasty  2011    Right-Dr. Tamera Punt  . Colonoscopy  08/22/09    Fields-(Tubular Adenoma)3-mm transverse polyp/4-mm polyp  otherwise noraml/small internal hemorrhoids  . Esophagogastroduodenoscopy [name] 03/05/2014    Procedure: ESOPHAGOGASTRODUODENOSCOPY (EGD);  Surgeon: Danie Binder, MD;  Location: [facility];  Service: Endoscopy;   Laterality: [name];  11:45  . Agile capsule [name] 03/18/2014    Procedure: AGILE CAPSULE;  Surgeon: Danie Binder, MD;   Location: [facility];  Service: Endoscopy;  Laterality: [name];   7:30  . Givens capsule study [name] 03/30/2014    Procedure: GIVENS CAPSULE STUDY;  Surgeon: Danie Binder, MD;   Location: [facility];  Service: Endoscopy;  Laterality: [name];   7:30  . Colonoscopy [name] 04/14/2014    Procedure: COLONOSCOPY;  Surgeon: Danie Binder, MD;   Location: [facility];  Service: Endoscopy;  Laterality: [name];   9:68   HPI:  68 year old male, current smoker with PMH of COPD, alcohol abuse,  chronic bronchitis, TIA, HTN, LBBB who presented to [facility] ED via  EMS 7/9 for progressively worsening SOB and respiratory distress.  Transferred to [facility].  Intubated 7/10-7/17. NSTEMI     Assessment / Plan / Recommendation Clinical Impression  Dysphagia Diagnosis: Moderate oral phase dysphagia;Mild  pharyngeal phase dysphagia Clinical impression: Pt presents with a moderate oral dysphagia  impacting safety with PO. Pt is moderately confused, edentulous  and tremulous  with very dry oral mucosa. With  most boluses, pt  will briefly hold bolus orally with occasional lingual tremors  prior to active transit. Intermittently pt has premature spillage  of partial bolus to pyriforms and sometimes pt actively transits  bolus with a delayed swallow initiation and aspiration before the  swallow. Of note, pt often clears throat post swallow, unrelated  to penetration or aspiration. Initially aspiration event was  silent, but then followed by late, hard, effective cough that  expelled thin aspirate. In some trials swallow was timely,  suggestive of a primary cognitive problem rather than a true  sensory deficit. Strength is WNL and trace residuals coating all  oropahryngeal structures likely due to dry mucosa. Recommend  nectar thick liquids as pt orally manages this bolus best and has  decreased risk of aspiration before the swallow. Will also start  pt with a puree diet, though expect as mentation improves he may  tolerate soft solids and thin liquids well.     Treatment Recommendation  Therapy as outlined in treatment plan below    Diet Recommendation Dysphagia 1 (Puree);Nectar-thick liquid   Liquid Administration via: Cup;Straw Medication Administration: Crushed with puree Supervision: Staff to assist with self feeding Compensations: Slow rate;Small sips/bites Postural Changes and/or Swallow Maneuvers: Seated upright 90  degrees;Upright 30-60 min after meal    Other  Recommendations Oral Care Recommendations: Oral care BID Other Recommendations: Order thickener from pharmacy   Follow Up Recommendations  Skilled Nursing facility    Frequency and Duration min 2x/week  2 weeks   Pertinent Vitals/Pain NA    SLP Swallow Goals     General Date of Onset: 06/03/14 HPI: 68 year old male, current smoker with PMH of COPD, alcohol  abuse, chronic bronchitis, TIA, HTN, LBBB who presented to [facility] ED  via EMS 7/9 for progressively worsening SOB and respiratory  distress. Transferred to [facility].  Intubated  7/10-7/17. NSTEMI Type of Study: Modified Barium Swallowing Study Reason for Referral: Objectively evaluate swallowing function Previous Swallow Assessment: none per records Diet Prior to this Study: NPO Temperature Spikes Noted: No Respiratory Status: Room air History of Recent Intubation: Yes Length of Intubations (days): 7 days Date extubated: 06/11/14 Behavior/Cognition: Alert;Cooperative;Decreased sustained  attention;Requires cueing;Distractible Oral Cavity - Dentition: Edentulous Oral Motor / Sensory Function: Within functional limits Self-Feeding Abilities: Needs assist Patient Positioning: Upright in chair Baseline Vocal Quality: Breathy;Hoarse;Low vocal intensity Volitional Cough: Strong Volitional Swallow: Able to elicit Anatomy:  (dry oral mucosa) Pharyngeal Secretions: Not observed secondary MBS    Reason for Referral Objectively evaluate swallowing function   Oral Phase Oral Preparation/Oral Phase Oral Phase: Impaired Oral - Nectar Oral - Nectar Cup: Lingual/palatal residue;Delayed oral  transit;Reduced posterior propulsion;Holding of bolus;Lingual  pumping Oral - Nectar Straw: Lingual/palatal residue;Delayed oral  transit;Reduced posterior propulsion;Holding of bolus;Lingual  pumping Oral - Thin Oral - Thin Cup: Lingual/palatal residue;Delayed oral  transit;Reduced posterior propulsion;Holding of bolus;Lingual  pumping Oral - Thin Straw: Lingual/palatal residue;Delayed oral  transit;Reduced posterior propulsion;Holding of bolus;Lingual  pumping Oral - Solids Oral - Puree: Lingual/palatal residue;Delayed oral  transit;Reduced posterior propulsion;Holding of bolus;Lingual  pumping Oral - Mechanical Soft: Lingual/palatal residue;Delayed oral  transit;Reduced posterior propulsion;Holding of bolus;Lingual  pumping;Piecemeal swallowing Oral - Pill: Lingual/palatal residue;Delayed oral transit;Reduced  posterior propulsion;Holding of bolus;Lingual pumping   Pharyngeal Phase Pharyngeal Phase Pharyngeal Phase:  Impaired Pharyngeal - Nectar Pharyngeal - Nectar Cup: Delayed swallow initiation;Premature  spillage to valleculae Pharyngeal - Nectar Straw: Delayed swallow initiation;Premature  spillage to valleculae Pharyngeal - Thin Pharyngeal - Thin Cup: Delayed swallow initiation;Premature  spillage to pyriform sinuses Pharyngeal - Thin Straw: Delayed swallow initiation;Premature  spillage to pyriform sinuses;Penetration/Aspiration before  swallow;Moderate aspiration Penetration/Aspiration details (thin straw): Material enters  airway, passes BELOW cords without attempt by patient to eject  out (silent aspiration) Pharyngeal - Solids Pharyngeal - Puree: Delayed swallow initiation Pharyngeal - Mechanical Soft: Delayed swallow  initiation;Premature spillage to pyriform sinuses  Cervical Esophageal Phase    GO    Cervical Esophageal Phase Cervical Esophageal Phase: [facility]        Herbie Baltimore, MA CCC-SLP [phone]  DeBlois, Katherene Ponto 06/14/2014, 10:27 AM     Medications: Scheduled Meds: . antiseptic oral rinse  15 mL Mouth Rinse QID  . aspirin  81 mg Oral Daily  . atorvastatin  80 mg Oral q1800  . chlorhexidine  15 mL Mouth Rinse BID  . clopidogrel  75 mg Oral Daily  . feeding supplement (ENSURE)  1 Container Oral BID BM  . folic acid  1 mg Oral Daily  . heparin subcutaneous  5,000 Units Subcutaneous 3 times per day  . insulin aspart  0-15 Units Subcutaneous TID WC  . insulin aspart  0-5 Units Subcutaneous QHS  . metoprolol tartrate  12.5 mg Oral BID  . multivitamin with minerals  1 tablet Oral Daily  . nicotine  21 mg Transdermal Daily  . potassium chloride  40 mEq Oral Once  . risperiDONE  0.5 mg Oral q morning - 10a  . risperiDONE  1 mg Oral QHS  . sodium chloride  3 mL Intravenous Q12H  . thiamine  100 mg Oral Daily      LOS: 18 days   Aleaya Latona DO [facility] 06/21/2014, 10:38 AM Pager: [phone]  If 7PM-7AM, please contact night-coverage [email or web address] Password  [email or web address]  **Disclaimer: This note was dictated with voice recognition software. Similar sounding words can inadvertently be transcribed and this note may contain transcription errors which may not have been corrected upon publication of note.**

## 2014-06-21 NOTE — Progress Notes (Signed)
[facility] ROUNDING NOTE   Subjective:   Interval History: improved  Objective:  Vital signs in last 24 hours:  Temp:  [97.5 F (36.4 C)-97.8 F (36.6 C)] 97.8 F (36.6 C) (07/27 0703) Pulse Rate:  [87-95] 90 (07/27 0703) Resp:  [20-22] 22 (07/27 0703) BP: (110-122)/(67-83) 110/67 mmHg (07/27 0703) SpO2:  [96 %-100 %] 100 % (07/27 0703) Weight:  [60.6 kg (133 lb 9.6 oz)] 60.6 kg (133 lb 9.6 oz) (07/27 0600)  Weight change: 3.4 kg (7 lb 7.9 oz) Filed Weights   06/19/14 0422 06/20/14 0446 06/21/14 0600  Weight: 61.1 kg (134 lb 11.2 oz) 57.2 kg (126 lb 1.7 oz) 60.6 kg (133 lb 9.6 oz)    Intake/Output: I/O last 3 completed shifts: In: 480 [P.O.:480] Out: 1052 [Urine:1050; Stool:2]   Intake/Output this shift:  Total I/O In: 240 [P.O.:240] Out: 2 [Urine:1; Stool:1] General appearance: distracted and slowed mentation  Head: Normocephalic, without obvious abnormality, atraumatic  Resp: diminished breath sounds anterior - bilateral and bibasilar  Chest wall: no tenderness  Cardio: S1, S2 normal  GI: soft, non-tender; bowel sounds normal; no masses, no organomegaly  Extremities: extremities normal, atraumatic, no cyanosis or edema      Basic Metabolic Panel:  Recent Labs Lab 06/17/14 1410 06/18/14 0310 06/19/14 0311 06/20/14 0405 06/21/14 0653  NA 150* 149* 144 149* 142  K 3.8 3.2* 3.3* 3.6* 3.1*  CL 112 111 108 113* 105  CO2 22 21 19 20 22   GLUCOSE 189* 142* 117* 137* 136*  BUN 87* 71* 44* 34* 21  CREATININE 2.18* 1.83* 1.34 1.17 0.90  CALCIUM 9.2 9.2 9.2 9.3 8.7    Liver Function Tests: No results found for this basename: AST, ALT, ALKPHOS, BILITOT, PROT, ALBUMIN,  in the last 168 hours No results found for this basename: LIPASE, AMYLASE,  in the last 168 hours  Recent Labs Lab 06/16/14 0041  AMMONIA 28    CBC:  Recent Labs Lab 06/15/14 0546 06/18/14 0310  WBC 13.4* 9.4  HGB 10.8* 10.9*  HCT 34.4* 34.2*  MCV 89.4 88.6  PLT  500* 358    Cardiac Enzymes: No results found for this basename: CKTOTAL, CKMB, CKMBINDEX, TROPONINI,  in the last 168 hours  BNP: No components found with this basename: POCBNP,   CBG:  Recent Labs Lab 06/20/14 0807 06/20/14 1140 06/20/14 1626 06/20/14 2218 06/21/14 0715  GLUCAP 142* 137* 136* 123* 148*    Microbiology: Results for orders placed during the hospital encounter of 06/03/14  MRSA PCR SCREENING     Status: None   Collection Time    06/03/14  2:07 PM      Result Value Ref Range Status   MRSA by PCR NEGATIVE  NEGATIVE Final   Comment:            The GeneXpert MRSA Assay (FDA     approved for NASAL specimens     only), is one component of a     comprehensive MRSA colonization     surveillance program. It is not     intended to diagnose MRSA     infection nor to guide or     monitor treatment for     MRSA infections.  CULTURE, BLOOD (ROUTINE X 2)     Status: None   Collection Time    06/03/14  3:00 PM      Result Value Ref Range Status   Specimen Description BLOOD LEFT ANTECUBITAL   Final   Special  Requests     Final   Value: BOTTLES DRAWN AEROBIC AND ANAEROBIC 10CC AER 4CC ANA   Culture  Setup Time     Final   Value: 06/03/2014 18:58     Performed at [facility]   Culture     Final   Value: NO GROWTH 5 DAYS     Performed at [facility]   Report Status 06/09/2014 FINAL   Final  CULTURE, BLOOD (ROUTINE X 2)     Status: None   Collection Time    06/03/14  3:16 PM      Result Value Ref Range Status   Specimen Description BLOOD LEFT ANTECUBITAL   Final   Special Requests BOTTLES DRAWN AEROBIC ONLY 10CC   Final   Culture  Setup Time     Final   Value: 06/03/2014 18:58     Performed at [facility]   Culture     Final   Value: NO GROWTH 5 DAYS     Performed at [facility]   Report Status 06/09/2014 FINAL   Final  URINE CULTURE     Status: None   Collection Time    06/03/14  3:45 PM      Result Value Ref Range  Status   Specimen Description URINE, CATHETERIZED   Final   Special Requests NONE   Final   Culture  Setup Time     Final   Value: 06/03/2014 19:04     Performed at [facility]     Final   Value: NO GROWTH     Performed at [facility]   Culture     Final   Value: NO GROWTH     Performed at [facility]   Report Status 06/04/2014 FINAL   Final  STOOL CULTURE     Status: None   Collection Time    06/15/14  7:24 AM      Result Value Ref Range Status   Specimen Description STOOL   Final   Special Requests Normal   Final   Culture     Final   Value: NO SALMONELLA, SHIGELLA, CAMPYLOBACTER, YERSINIA, OR E.COLI 0157:H7 ISOLATED     Note: REDUCED NORMAL FLORA PRESENT     Performed at [facility]   Report Status 06/19/2014 FINAL   Final  CLOSTRIDIUM DIFFICILE BY PCR     Status: None   Collection Time    06/15/14  7:24 AM      Result Value Ref Range Status   C difficile by pcr NEGATIVE  NEGATIVE Final    Coagulation Studies: No results found for this basename: LABPROT, INR,  in the last 72 hours  Urinalysis: No results found for this basename: COLORURINE, APPERANCEUR, LABSPEC, PHURINE, GLUCOSEU, HGBUR, BILIRUBINUR, KETONESUR, PROTEINUR, UROBILINOGEN, NITRITE, LEUKOCYTESUR,  in the last 72 hours    Imaging: No results found.   Medications:   . dextrose 100 mL/hr at 06/21/14 0137   . antiseptic oral rinse  15 mL Mouth Rinse QID  . aspirin  81 mg Oral Daily  . atorvastatin  80 mg Oral q1800  . chlorhexidine  15 mL Mouth Rinse BID  . clopidogrel  75 mg Oral Daily  . feeding supplement (ENSURE)  1 Container Oral BID BM  . folic acid  1 mg Oral Daily  . heparin subcutaneous  5,000 Units Subcutaneous 3 times per day  . insulin aspart  0-15 Units Subcutaneous  TID WC  . insulin aspart  0-5 Units Subcutaneous QHS  . metoprolol tartrate  12.5 mg Oral BID  . multivitamin with minerals  1 tablet Oral Daily  . nicotine  21 mg  Transdermal Daily  . potassium chloride  40 mEq Oral Once  . risperiDONE  0.5 mg Oral q morning - 10a  . risperiDONE  1 mg Oral QHS  . sodium chloride  3 mL Intravenous Q12H  . thiamine  100 mg Oral Daily   sodium chloride, food thickener, ipratropium-albuterol, LORazepam, metoprolol, RESOURCE THICKENUP CLEAR, sodium chloride  Assessment/ Plan:   Acute renal failure resolve  Hypernatremia is going to be a problematic management due to patient being unable to take in free water  There is little to add from nephrology perspective and will sign off if ok with primary MD  LOS: 18 Roy [name] W @TODAY @10 :44 AM

## 2014-06-21 NOTE — Progress Notes (Signed)
Physical Therapy Treatment Patient Details Name: Roy [name] MRN: [id number] DOB: 27-Jan-1946 Today's Date: 06/21/2014    History of Present Illness Patient is a 68 yo male admitted 06/03/14 with respiratory failure, CHF/COPD, NSTEMI, acute encephalopaty (? ETOH w/d per chart).  Patient intubated 06/04/14, and extubated 06/11/14.  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     PT Comments    Pt did better today than at last treatment session. Able to sit EOB with min/guard.  In standing, requires MAX of 2 with R and posterior lean with inability to unweight LE to take any steps.  Knee buckeling at times. Con't to recommend LTAC.  Follow Up Recommendations  LTACH;Supervision/Assistance - 24 hour     Equipment Recommendations  Rolling walker with 5" wheels    Recommendations for Other Services       Precautions / Restrictions Precautions Precautions: Fall Restrictions Weight Bearing Restrictions: No    Mobility  Bed Mobility Overal bed mobility: Needs Assistance;+2 for physical assistance Bed Mobility: Supine to Sit;Sit to Supine     Supine to sit: +2 for physical assistance;Min assist Sit to supine: Min guard   General bed mobility comments: Pt able to initiate transfer with cueing.  Transfers Overall transfer level: Needs assistance Equipment used: 2 person hand held assist Transfers: Sit to/from Stand Sit to Stand: +2 physical assistance;Mod assist         General transfer comment: MOD of 2 to acheive standing, but requires MAX of 2 to maintain upright posture. Leans R and posteriorly with B knee buckeling.  Unable to take any side steps.   Ambulation/Gait                 Stairs            Wheelchair Mobility    Modified Rankin (Stroke Patients Only)       Balance Overall balance assessment: Needs assistance Sitting-balance support: Feet  supported Sitting balance-Leahy Scale: Fair   Postural control: Right lateral lean;Posterior lean Standing balance support: Bilateral upper extremity supported Standing balance-Leahy Scale: Zero Standing balance comment: Attempted to get pt to shift COG over BOS with little success. Pt actually extended more increasing fall risk.                    Cognition Arousal/Alertness: Awake/alert Behavior During Therapy: Impulsive;Restless   Area of Impairment: Problem solving;Safety/judgement;Orientation;Memory Orientation Level: Disoriented to;Situation;Time;Place   Memory: Decreased short-term memory Following Commands: Follows one step commands inconsistently Safety/Judgement: Decreased awareness of safety;Decreased awareness of deficits   Problem Solving: Requires tactile cues;Requires verbal cues;Slow processing      Exercises      General Comments General comments (skin integrity, edema, etc.): Pt impulsive at times and difficulty following directions. When cueing him to put feet on floor for increased balance with sitting EOB, he put them up on the bed criss-crossed.      Pertinent Vitals/Pain No reports of pain.    Home Living                      Prior Function            PT Goals (current goals can now be found in the care plan section) Acute Rehab PT Goals Patient Stated Goal: None stated  PT Goal Formulation: With patient Time For Goal Achievement: 06/26/14 Potential to Achieve Goals: Good Progress towards PT goals:  Progressing toward goals    Frequency  Min 2X/week    PT Plan Current plan remains appropriate    Co-evaluation             End of Session Equipment Utilized During Treatment: Gait belt Activity Tolerance: Patient tolerated treatment well Patient left: in bed;with call bell/phone within reach;with bed alarm set (do not feel pt is safe to be up in chair even with alarm)     Time: 6546-5035 PT Time Calculation (min):  15 min  Charges:  $Therapeutic Activity: 8-22 mins                    G Codes:      Yanette Tripoli LUBECK 06/21/2014, 12:18 PM

## 2014-06-22 ENCOUNTER — Inpatient Hospital Stay
Admission: RE | Admit: 2014-06-22 | Discharge: 2014-07-26 | Disposition: A | Discharge status: Home / Self Care | Payer: Medicare Other | Source: Ambulatory Visit | Attending: Internal Medicine | Admitting: Internal Medicine

## 2014-06-22 DIAGNOSIS — I679 Cerebrovascular disease, unspecified: Secondary | ICD-10-CM

## 2014-06-22 DIAGNOSIS — R069 Unspecified abnormalities of breathing: Principal | ICD-10-CM

## 2014-06-22 DIAGNOSIS — R1310 Dysphagia, unspecified: Secondary | ICD-10-CM

## 2014-06-22 LAB — BASIC METABOLIC PANEL
Anion gap: 16 — ABNORMAL HIGH (ref 5–15)
BUN: 15 mg/dL (ref 6–23)
CO2: 19 mEq/L (ref 19–32)
Calcium: 8.6 mg/dL (ref 8.4–10.5)
Chloride: 107 mEq/L (ref 96–112)
Creatinine, Ser: 0.79 mg/dL (ref 0.50–1.35)
GFR calc non Af Amer: >90 mL/min (ref >90)
Glucose, Bld: 115 mg/dL — ABNORMAL HIGH (ref 70–99)
POTASSIUM: 3.7 meq/L (ref 3.7–5.3)
SODIUM: 142 meq/L (ref 137–147)

## 2014-06-22 LAB — GLUCOSE, CAPILLARY
GLUCOSE-CAPILLARY: 124 mg/dL — AB (ref 70–99)
Glucose-Capillary: 116 mg/dL — ABNORMAL HIGH (ref 70–99)

## 2014-06-22 MED ORDER — RISPERIDONE 0.5 MG PO TABS: 0.5000 mg | ORAL_TABLET | Freq: Every morning | ORAL | Status: DC

## 2014-06-22 MED ORDER — METOPROLOL TARTRATE 12.5 MG HALF TABLET
12.5000 mg | ORAL_TABLET | Freq: Two times a day (BID) | ORAL | Status: DC
Start: 2014-06-22 — End: 2014-08-20

## 2014-06-22 MED ORDER — INSULIN ASPART 100 UNIT/ML ~~LOC~~ SOLN: 0.0000 [IU] | Freq: Three times a day (TID) | SUBCUTANEOUS | Status: DC

## 2014-06-22 MED ORDER — RISPERIDONE 1 MG PO TABS: 1.0000 mg | ORAL_TABLET | Freq: Every day | ORAL | Status: DC

## 2014-06-22 MED ORDER — THIAMINE HCL 100 MG PO TABS: 100.0000 mg | ORAL_TABLET | Freq: Every day | ORAL | Status: DC

## 2014-06-22 MED ORDER — FOLIC ACID 1 MG PO TABS: 1.0000 mg | ORAL_TABLET | Freq: Every day | ORAL | Status: DC

## 2014-06-22 MED ORDER — ENSURE PUDDING PO PUDG: 1.0000 | Freq: Two times a day (BID) | ORAL | Status: DC

## 2014-06-22 MED ORDER — IPRATROPIUM-ALBUTEROL 0.5-2.5 (3) MG/3ML IN SOLN: 3.0000 mL | RESPIRATORY_TRACT | Status: DC | PRN

## 2014-06-22 MED ORDER — NICOTINE 21 MG/24HR TD PT24: 21.0000 mg | MEDICATED_PATCH | Freq: Every day | TRANSDERMAL | Status: DC

## 2014-06-22 MED ORDER — STARCH (THICKENING) PO POWD: ORAL | Status: DC

## 2014-06-22 NOTE — Discharge Summary (Signed)
Physician Discharge Summary  Patient ID: Roy [name] MRN: [id number] DOB/AGE: 1946-09-30 68 y.o.  Admit date: 06/03/2014 Discharge date: 06/22/2014  Primary Care Physician:  [name], MD  Discharge Diagnoses:    . Acute respiratory failure . Pneumonia, organism unspecified . NSTEMI (non-ST elevated myocardial infarction) . Alcohol withdrawal delirium . Tobacco abuse . Acute systolic heart failure . COPD exacerbation . Hypernatremia . Acute renal failure . Encephalopathy  Consults:  Nephrology, Dr. Edrick Oh                     Critical care                    Speech therapy   Recommendations for Outpatient Follow-up:  Please check BMET for Na level on Friday 06/25/14. If Na 150 or above, please inform MD and start on D5 solution for 24hours or until Na is less than 145.   Please check Na level every 3-4 days.   Please repeat Swallow evaluation at the facility  if diet can be up graded  Allergies:  No Known Allergies   Discharge Medications:   Medication List    STOP taking these medications       amLODipine-benazepril 5-20 MG per capsule  Commonly known as:  LOTREL     cloNIDine 0.1 mg/24hr patch  Commonly known as:  CATAPRES - Dosed in mg/24 hr     Krill Oil 300 MG Caps     loratadine 10 MG tablet  Commonly known as:  CLARITIN     LORazepam 1 MG tablet  Commonly known as:  ATIVAN     PARoxetine 20 MG tablet  Commonly known as:  PAXIL     PRENATAL 1 PO     pseudoephedrine-guaifenesin 60-600 MG per tablet  Commonly known as:  MUCINEX D     pyridOXINE 100 MG tablet  Commonly known as:  VITAMIN B-6     vitamin C 500 MG tablet  Commonly known as:  ASCORBIC ACID      TAKE these medications       aspirin EC 81 MG tablet  Take 81 mg by mouth every evening.     atorvastatin 20 MG tablet  Commonly known as:  LIPITOR  Take 20 mg by mouth daily.     CENTRUM SILVER PO  Take 0.5 tablets by mouth 2 (two) times daily.     clopidogrel 75 MG  tablet  Commonly known as:  PLAVIX  Take 75 mg by mouth every evening.     Co Q 10 100 MG Caps  Take 100 mg by mouth every evening.     feeding supplement (ENSURE) Pudg  Take 1 Container by mouth 2 (two) times daily between meals.     folic acid 1 MG tablet  Commonly known as:  FOLVITE  Take 1 tablet (1 mg total) by mouth daily.     food thickener Powd  Commonly known as:  THICK IT  Take as directed     insulin aspart 100 UNIT/ML injection  Commonly known as:  novoLOG  Inject 0-15 Units into the skin 3 (three) times daily with meals.     ipratropium-albuterol 0.5-2.5 (3) MG/3ML Soln  Commonly known as:  DUONEB  Take 3 mLs by nebulization every 4 (four) hours as needed.     metoprolol tartrate 12.5 mg Tabs tablet  Commonly known as:  LOPRESSOR  Take 0.5 tablets (12.5 mg total) by mouth 2 (two) times  daily.     nicotine 21 mg/24hr patch  Commonly known as:  NICODERM CQ - dosed in mg/24 hours  Place 1 patch (21 mg total) onto the skin daily.     pantoprazole 40 MG tablet  Commonly known as:  PROTONIX  Take 40 mg by mouth 2 (two) times daily.     risperiDONE 1 MG tablet  Commonly known as:  RISPERDAL  Take 1 tablet (1 mg total) by mouth at bedtime.     risperiDONE 0.5 MG tablet  Commonly known as:  RISPERDAL  Take 1 tablet (0.5 mg total) by mouth every morning.     thiamine 100 MG tablet  Take 1 tablet (100 mg total) by mouth daily.         Brief H and P: For complete details please refer to admission H and P, but in brief Patient is a 68 year old male, current smoker (120 pack years) with PMH of COPD, alcohol abuse, chronic bronchitis, TIA, HTN, LBBB who presented to [facility] ED via EMS 7/9 for progressively worsening SOB and respiratory distress. Per EMS, sats in 70s on arrival to pt's home. Patient was started on CPAP and transitioned to BiPAP in ED. EKG neg for acute MI, elevated troponin, CXR with pulm edema.   Hospital Course:  68 y.o. active smoker (120 pack  years) with COPD was admitted 7/9 with pneumonia and hypoxemic respiratory failure. Initially with elevated troponins (peaked at 4.84) but EKG was neg acute MI. CXR showed pulm edema and ultimately found to have severe systolic dysfunction with an EF of 20% with global hypokinesis. He was intubated and treated for AECOPD +/- PNA with IV steroids, empiric IV antibiotics for CAP, Nebulized BDs, diuresis and cardiology was consulted.  He was successfully extubated 7/17 and he was continued on bronchodilators and aggressive pulm hygiene. He improved with abx and diuresis and and cardiology followed closely for NSTEMI for medical management with asa, plavix, B blocker, statin.  Initial cardiology plan was for cardiac cath but course was complicated due to alcohol withdrawal and metabolic encephalopathy and cath was deferred in setting confusion.  At this time he is overall much improved, remains moderately confused but improving. Passed swallow eval with only mild aspiration and is ok for oral dysphagia 1 diet per speech therapy. He needs continued rehab efforts and plan is for d/c to SNF [facility]) when medically ready.  -has been unable to keep Na down as he has not been able to drink water- on thickened liquids. Strongly recommend to periodically check sodium level and if above 150, to place on D5 solution. This was confirmed by the social worker with the facility prior to discharge..  Acute respiratory failure  - resolved: multifactorial likely due to acute CHF, community acquired pneumonia, COPD exacerbation  - O2 sats 100% on room air   Acute encephalopathy: Slowly improving - Possibly multifactorial due to metabolic encephalopathy from hypernatremia, uremia, alcohol withdrawal , improved somewhat after correcting metabolic abnormalities, hypernatremia, acute kidney injury  - CT head did not show acute stroke  Tobacco abuse - Continue nicotine patch   Alcohol withdrawal delirium  - required  Precedex drip and BZD's in the ICU , currently stable  Pneumonia, organism unspecified  - Has completed course of Zithromax and Rocephin   NSTEMI (non-ST elevated myocardial infarction) with acute systolic CHF  - EF 96%, possibly ischemic, cardiology recommended medical management at this time, not a candidate for cardiac cath given his renal failure  - Not  a candidate for ACE/ARB due to acute renal insufficiency   COPD exacerbation: improved  Hypernatremia With Acute renal :  - Nephrology was consulted however difficult situation as patient is on thick liquids and he needs free water. He was placed on D5 solution and sodium has been stable at 142. Patient's facility will be able to give him IV fluids if needed until his swallow function improves and he is able to take in liquids. This was confirmed by the social worker prior to DC.  Diarrhea: C. difficile negative, improving  Hypokalemia -replete      Day of Discharge BP 129/80  Pulse 110  Temp(Src) 98.1 F (36.7 C) (Oral)  Resp 18  Ht 5\' 6"  (1.676 m)  Wt 60.283 kg (132 lb 14.4 oz)  BMI 21.46 kg/m2  SpO2 100%  Physical Exam: General: Alert and awake, oriented to self and place CVS: S1-S2 clear no murmur rubs or gallops Chest: Diminished breath sounds at the bases Abdomen: soft nontender, nondistended, normal bowel sounds Extremities: no cyanosis, clubbing or edema noted bilaterally    The results of significant diagnostics from this hospitalization (including imaging, microbiology, ancillary and laboratory) are listed below for reference.    LAB RESULTS: Basic Metabolic Panel:  Recent Labs Lab 06/21/14 0653 06/22/14 0605  NA 142 142  K 3.1* 3.7  CL 105 107  CO2 22 19  GLUCOSE 136* 115*  BUN 21 15  CREATININE 0.90 0.79  CALCIUM 8.7 8.6   Liver Function Tests: No results found for this basename: AST, ALT, ALKPHOS, BILITOT, PROT, ALBUMIN,  in the last 168 hours No results found for this basename: LIPASE,  AMYLASE,  in the last 168 hours  Recent Labs Lab 06/16/14 0041  AMMONIA 28   CBC:  Recent Labs Lab 06/18/14 0310  WBC 9.4  HGB 10.9*  HCT 34.2*  MCV 88.6  PLT 358   Cardiac Enzymes: No results found for this basename: CKTOTAL, CKMB, CKMBINDEX, TROPONINI,  in the last 168 hours BNP: No components found with this basename: POCBNP,  CBG:  Recent Labs Lab 06/21/14 2054 06/22/14 0534  GLUCAP 124* 116*    Significant Diagnostic Studies:  Dg Chest Portable 1 View  06/03/2014   CLINICAL DATA:  Respiratory distress.  EXAM: PORTABLE CHEST - 1 VIEW  COMPARISON:  Chest x-ray 09/25/2010 .  FINDINGS: Cardiomegaly with pulmonary vascular prominence and interstitial prominence present consistent we congestive heart failure and pulmonary edema. No focal alveolar infiltrate. No pleural effusion or pneumothorax. Right shoulder replacement.  IMPRESSION: Congestive heart failure with pulmonary interstitial edema.   Electronically Signed   By: Marcello Moores  Register   On: 06/03/2014 10:09    2D ECHO: Study Conclusions  - Left ventricle: The cavity size was mildly dilated. Wall thickness was increased in a pattern of mild LVH. The estimated ejection fraction was 20%. Diffuse hypokinesis. Regional wall motion abnormalities cannot be excluded. Doppler parameters are consistent with restrictive physiology, indicative of decreased left ventricular diastolic compliance and/or increased left atrial pressure. - Left atrium: The atrium was mildly dilated.    Disposition and Follow-up: Discharge Instructions   Discharge instructions    Complete by:  As directed   DISCHARGE DIET: Dysphagia 1 with nectar thick liquids     Increase activity slowly    Complete by:  As directed             DISPOSITION: Skilled nursing facility  DIET: Dysphagia 1 with a thick liquids   TESTS THAT NEED FOLLOW-UP BMET  on Friday 06/25/14  DISCHARGE FOLLOW-UP Follow-up Information   Follow up with [name],  MD. Schedule an appointment as soon as possible for a visit in 1 week. (for hospital follow-up, , obtain labs for BMET)    Specialty:  Internal Medicine   Contact information:    [place]  [id number] [phone]       Time spent on Discharge: 40 minutes  Signed:   Bonnye Halle M.D. [facility] 06/22/2014, 10:50 AM Pager: [phone]   **Disclaimer: This note was dictated with voice recognition software. Similar sounding words can inadvertently be transcribed and this note may contain transcription errors which may not have been corrected upon publication of note.**

## 2014-06-22 NOTE — Progress Notes (Signed)
CSW to remain following for placement whenever pt is medically ready for dc.  Hunt Oris, MSW, [place]

## 2014-06-22 NOTE — Progress Notes (Signed)
1615 report given to Nyra Capes , RN [facility]

## 2014-06-22 NOTE — Progress Notes (Signed)
1430 discharged pt to [place] [facility] . Pt fullt awake alert and coherent . No apparent distress.

## 2014-06-22 NOTE — Progress Notes (Signed)
Pt with order for D5% solution at 45ml/hr, pt found to be on NS at 42ml/hr. NS bag with 628ml left in bag. Pt placed on D5% solution as per order. Will continue to monitor. Ronnette Hila, RN

## 2014-06-23 ENCOUNTER — Non-Acute Institutional Stay (SKILLED_NURSING_FACILITY): Payer: Medicare Other | Admitting: Internal Medicine

## 2014-06-23 DIAGNOSIS — J441 Chronic obstructive pulmonary disease with (acute) exacerbation: Secondary | ICD-10-CM

## 2014-06-23 DIAGNOSIS — I2589 Other forms of chronic ischemic heart disease: Secondary | ICD-10-CM

## 2014-06-23 DIAGNOSIS — R404 Transient alteration of awareness: Secondary | ICD-10-CM

## 2014-06-23 DIAGNOSIS — E870 Hyperosmolality and hypernatremia: Secondary | ICD-10-CM

## 2014-06-23 DIAGNOSIS — I255 Ischemic cardiomyopathy: Secondary | ICD-10-CM

## 2014-06-23 DIAGNOSIS — F10231 Alcohol dependence with withdrawal delirium: Secondary | ICD-10-CM

## 2014-06-23 DIAGNOSIS — F10931 Alcohol use, unspecified with withdrawal delirium: Secondary | ICD-10-CM

## 2014-06-23 LAB — GLUCOSE, CAPILLARY
Comment 1: 269621
Comment 1: 269621
GLUCOSE-CAPILLARY: 158 mg/dL — AB (ref 70–99)
GLUCOSE-CAPILLARY: 125 mg/dL — AB (ref 70–99)
GLUCOSE-CAPILLARY: 115 mg/dL — AB (ref 70–99)
Glucose-Capillary: 147 mg/dL — ABNORMAL HIGH (ref 70–99)
Glucose-Capillary: 170 mg/dL — ABNORMAL HIGH (ref 70–99)

## 2014-06-23 NOTE — Progress Notes (Signed)
Patient ID: Roy [name], male   DOB: 1946/09/17, 68 y.o.   MRN: [id number]  Facility; [facility] Chief complaint; admission to SNF post admit to [facility] from 7/9 to 7/28  History ; this is a 68 year old man who was admitted to hospital acutely L. in respiratory distress. He required a BiPAP and ultimately intubation. He was admitted with COPD pneumonia congestive heart failure. He was found to have elevated troponins with a peak at 4.84 he was successfully extubated on 7/17. Echocardiogram showed a severe systolic cardiomyopathy with an ejection fraction of 20%. The patient went into severe DTs secondary to alcohol withdrawal therefore was not felt to be a candidate for cardiac catheterization. He underwent medical management with ASA, Plavix beta blocker and a statin.  Other medical issues included mild aspiration according to a swallowing study. For some reason he is on thickened liquids. He has not been able to drink satisfactory amounts of his thickened liquids and therefore his sodium has been high. He was seen by nephrology. As I understand looking at his lab work it looked as though his specific gravity and urine osmolality were increasing therefore he was not felt to have a diabetes insipidus issue. Pagliaro also had diarrhea that was C. difficile negative. He was hypokalemic which required repletion.  Apparently the patient was drinking copious amounts of beer per week. This was verified today with his family. He apparently has had cognitive issues for 2 years which seem to have waxed and waned according to his drinking according to his daughter  Past Medical History  Diagnosis Date  . Cerebrovascular disease 2009    TIA; 2009- right ICA stent; re-intervention for restenosis complicated by [facility] w/o sx  . Degenerative joint disease     Total shoulder arthroplasty-right  . LBBB (left bundle branch block)     Normal echo-2011; stress nuclear in 09/2010--septal hypoperfusion representing  nontransmural infarction or the effect of left bundle branch block, no ischemia  . Hyperlipidemia   . Hypertension   . Chronic obstructive pulmonary disease   . Tobacco abuse     -100 pack years; 1.5 packs per day  . Anxiety and depression   . Alcohol abuse     6 beers per day; hospital admission in 2009 for withdrawal symptoms  . Thrombocytopenia   . Tubular adenoma of colon    Past Surgical History  Procedure Laterality Date  . Lumbar fusion  2010  . Vasectomy  1971  . Colonoscopy w/ polypectomy  2011  . Total shoulder arthroplasty  2011    Right-Dr. Tamera Punt  . Colonoscopy  08/22/09    Fields-(Tubular Adenoma)3-mm transverse polyp/4-mm polyp otherwise noraml/small internal hemorrhoids  . Esophagogastroduodenoscopy [name] 03/05/2014    Procedure: ESOPHAGOGASTRODUODENOSCOPY (EGD);  Surgeon: Danie Binder, MD;  [facility];  Service: Endoscopy;  Laterality: [name];  11:45  . Agile capsule [name] 03/18/2014    Procedure: AGILE CAPSULE;  Surgeon: Danie Binder, MD;  Location: [facility];  Service: Endomedscopy;  Laterality: [name];  7:30  . Givens capsule study [name] 03/30/2014    Procedure: GIVENS CAPSULE STUDY;  Surgeon: Danie Binder, MD;  Location: [facility];  Service: Endoscopy;  Laterality: [name];  7:30  . Colonoscopy [name] 04/14/2014    Procedure: COLONOSCOPY;  Surgeon: Danie Binder, MD;  Location: [facility];  Service: Endoscopy;  Laterality: [name];  9:30   Current Outpatient Prescriptions on File Prior to Visit  Medication Sig Dispense Refill  . aspirin EC  81 MG tablet Take 81 mg by mouth every evening.       [name] atorvastatin (LIPITOR) 20 MG tablet Take 20 mg by mouth daily.       . clopidogrel (PLAVIX) 75 MG tablet Take 75 mg by mouth every evening.       . Coenzyme Q10 (CO Q 10) 100 MG CAPS Take 100 mg by mouth every evening.       . feeding supplement, ENSURE, (ENSURE) PUDG Take 1 Container by mouth 2 (two) times daily between meals.    0  . folic acid (FOLVITE) 1 MG  tablet Take 1 tablet (1 mg total) by mouth daily.      . food thickener (THICK IT) POWD Take as directed    0  . insulin aspart (NOVOLOG) 100 UNIT/ML injection Inject 0-15 Units into the skin 3 (three) times daily with meals.  10 mL  11  . ipratropium-albuterol (DUONEB) 0.5-2.5 (3) MG/3ML SOLN Take 3 mLs by nebulization every 4 (four) hours as needed.  360 mL    . metoprolol tartrate (LOPRESSOR) 12.5 mg TABS tablet Take 0.5 tablets (12.5 mg total) by mouth 2 (two) times daily.      . Multiple Vitamins-Minerals (CENTRUM SILVER PO) Take 0.5 tablets by mouth 2 (two) times daily.       . nicotine (NICODERM CQ - DOSED IN MG/24 HOURS) 21 mg/24hr patch Place 1 patch (21 mg total) onto the skin daily.  28 patch  0  . pantoprazole (PROTONIX) 40 MG tablet Take 40 mg by mouth 2 (two) times daily.      . risperiDONE (RISPERDAL) 0.5 MG tablet Take 1 tablet (0.5 mg total) by mouth every morning.  30 tablet  0  . risperiDONE (RISPERDAL) 1 MG tablet Take 1 tablet (1 mg total) by mouth at bedtime.  30 tablet  0  . thiamine 100 MG tablet Take 1 tablet (100 mg total) by mouth daily.        Social history; the patient lives in his own home with his wife daughter and other family. Alcohol history is as as noted. He had a very limited activity schedule according to the daughter. He did not use an ambulatory assist device there is no history of falls.  reports that he has been smoking Cigarettes.  He has a 60 pack-year smoking history. He has never used smokeless tobacco. He reports that he drinks about 36 ounces of alcohol per week. He reports that he does not use illicit drugs.  family history includes Alzheimer's disease in his father and sister; Coronary artery disease in his mother; Diabetes in his father and mother; Heart attack in his mother; Heart disease in his mother; Hypertension in his mother; Prostate cancer in his brother. There is no history of Colon cancer.  Review of systems Respiratory patient does not  describe shortness of breath Cardiac no chest pain GI no abdominal pain Mental status; once again this patient has had cognitive difficulties noted at home  Physical exam Gen. patient is lying in bed not in any distress. Speech is somewhat dysarthric Blood pressure 118/68 pulse 89-respirations 20-temperature 97 HEENT he is a dauntless but has dentures in his bedside table Respiratory; essentially clear entry bilaterally Cardiac heart sounds are very distant JVP is not elevated he appears to be euvolemic there is no peripheral edema Abdomen no liver no spleen no asterixis no tenderness no stigmata GU bladder is not distended there is no tenderness no CVA tenderness Neurologic; motor  he has no pronator drift. Strength in his lower extremities is normal. Tone is normal. Finger to nose testing is normal. Reflexes are normal in the upper extremities. There is a flicker bilaterally at the knee jerks absent ankle jerks both toes are downgoing Gait; extremely unbalanced. Slightly wide-based Mental status: The patient is alert. After some difficulty was able to come up with the year but could not [place] where he lived. Confused one of our staff members with his daughter. He could not state the month. HE is not orientated  Impression/plan #1 COPD acute this appears to be fairly stable at the moment. He is still on Ventolin nebulizers. We may be able to change him to puffers. He is also on nicotine replacement therapy.  #2 non-ST elevation MI with a severe cardiomyopathy [question partially alcohol-induced] right now he is not in active heart failure he is not on any diuretics appear he is not on a beta blocker, ACE inhibitor. There is a comment that he is not on a is not a candidate for an ACE inhibitor due to acute renal insufficiency #3 hypernatremia this will need to be followed there. This did not look like a central or peripheral water handwringing problem based on the fact that he did seem to  concentrate his urine. I am not sure why he is felt to need thickened liquids. #4 severe alcohol withdrawal. He is placed on Risperdal which I am assuming was related to this. This should be able to be tapered #5 significant cognitive issues. Historically at least some of this seems to be long term. He probably has acute on chronic alcohol related issues. Even if he does have underlying alcohol induced dementia this may improve with continued abstinence. #6 discharged on a loose sliding scale although he has not a labile diabetic care. His blood sugars so far have been in the low to mid 100s. I think we can reduce the frequency of testing to twice a day and continue to follow this up. #7 pneumonia it appears that he has been successfully treated. #8 severe gait ataxia; I could not get a history of this of the brief conversation I had with his daughter. Of course alcohol can affect the nervous system at multiple levels resulting in gait ataxia. All check to see whether he's had a B12 level. He will need aggressive PT/OT   continued physical therapy.

## 2014-06-24 DIAGNOSIS — F29 Unspecified psychosis not due to a substance or known physiological condition: Secondary | ICD-10-CM | POA: Diagnosis not present

## 2014-06-24 DIAGNOSIS — F411 Generalized anxiety disorder: Secondary | ICD-10-CM | POA: Diagnosis not present

## 2014-06-24 LAB — GLUCOSE, CAPILLARY
GLUCOSE-CAPILLARY: 100 mg/dL — AB (ref 70–99)
Glucose-Capillary: 115 mg/dL — ABNORMAL HIGH (ref 70–99)
Glucose-Capillary: 125 mg/dL — ABNORMAL HIGH (ref 70–99)

## 2014-06-25 LAB — GLUCOSE, CAPILLARY
GLUCOSE-CAPILLARY: 114 mg/dL — AB (ref 70–99)
Glucose-Capillary: 130 mg/dL — ABNORMAL HIGH (ref 70–99)

## 2014-06-26 LAB — GLUCOSE, CAPILLARY
Glucose-Capillary: 106 mg/dL — ABNORMAL HIGH (ref 70–99)
Glucose-Capillary: 153 mg/dL — ABNORMAL HIGH (ref 70–99)

## 2014-06-27 ENCOUNTER — Non-Acute Institutional Stay (SKILLED_NURSING_FACILITY): Payer: Medicare Other | Admitting: Internal Medicine

## 2014-06-27 ENCOUNTER — Encounter: Payer: Self-pay | Admitting: Internal Medicine

## 2014-06-27 DIAGNOSIS — I5021 Acute systolic (congestive) heart failure: Secondary | ICD-10-CM | POA: Diagnosis not present

## 2014-06-27 LAB — GLUCOSE, CAPILLARY
GLUCOSE-CAPILLARY: 108 mg/dL — AB (ref 70–99)
Glucose-Capillary: 79 mg/dL (ref 70–99)
Glucose-Capillary: 167 mg/dL — ABNORMAL HIGH (ref 70–99)

## 2014-06-27 NOTE — Progress Notes (Signed)
Patient ID: Roy [name], male   DOB: 05-Dec-1945, 68 y.o.   MRN: [id number]   This is an acute visit.  Level of care skilled.  Facility [facility].  Chief complaint acute visit secondary to weight gain with history of CHF  History of present illness.  Patient is a 68 year old male was admitted to the hospital respiratory distress.  He was admitted with COPD and pneumonia as well as congestive heart failure.  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  He underwent medical management with aspirin Plavix beta blocker and statin-patient went into severe DTs secondary to alcohol withdrawal and was not thought to be a candidate for cardiac catheterization.  He has had hypernatremia she was thought secondary to his diet of thickened liquids-be doing better now.  However this appears to have normalized recently he is doing better with his drinking as well as eating according to nursing staff.  His weights have been monitored and appears he's been gradually gaining weight on admission on July 28 it was 139.4 it rose to 144.2 on the 29th-146 on July 31 and today is 148.8.  He was not on diuretics secondary to his sodium issues however Lasix 20 mg a day was started yesterday by on call provided-and increased to 20 mg twice a day today  Clinically he appears to be stable he does not exhibit really any increased edema or complaints of shortness of breath or chest pain.  Family medical social history has been reviewed per admission note on 06/23/2014.  Medications have been reviewed per MAR.  Review of systems.  Quite limited secondary to patient's cognitive issues.  However respiratory no complaints shortness of breath.  Cardiac no chest pain.  GI no abdominal pain says he is very well nursing staff concurs with this.  Mental status is at baseline cognitive difficulties which is not new.  Physical exam.  Temperature is  98.2 pulse 90 respirations 20 blood pressure 130/72.  Weight today is 148.8.  In general this is a pleasant elderly male in no distress sitting comfortably in his wheelchair.  His chest is clear to auscultation there are slightly reduced breath sounds on the left lower base-there is no labored breathing.  Heart sounds are distant JVP does not appear to be elevated there is no significant lower extremity edema or sacral edema.  Abdomen is soft nontender with positive bowel sounds.  Neurologic appears to be grossly intact he does move all extremities x4 he is able to stand without assistance although quite unsteady.  Psych he is alert and oriented to self did cooperate with simple verbal problems.  Labs.  06/27/2014.  Sodium 141 potassium 3.4 BUN 7 creatinine 0.62.  06/25/2014.  Sodium 143 potassium 3.5 BUN 11 creatinine 0.68.  Assessment and plan.  #1-weight gain-there are concerns here with his history of CHF-with significantly reduced ejection fraction-however clinically he appears to be quite stable here with no signs of significant edema--or shortness of breath or chest pain-he has been started on Lasix 20 mg twice a day potassium supplementation also was started-at this point we'll continue to monitor since he appears to be quite stable -- he will need updated metabolic panel in a couple days to ensure stability especially with his history of hypernatremia--also will update BNP at that time although I suspect it will be chronically elevated I see in the hospital it was 4105 on July 13 and had been  over 5000  the week previous.  Of note Will obtain chest x-ray as well that for followup pneumonia as well as slightly reduced breath sounds on the left- clinically he appears to be quite stable  [id number]-of note greater than 25 minutes spent assessing patient asked reviewing his chart as discussing his status with nursing staff-and coordinating and formulating a plan of care-of note  greater than 50% of time spent coordinating plan of care  .

## 2014-06-28 ENCOUNTER — Ambulatory Visit (HOSPITAL_COMMUNITY): Payer: No Typology Code available for payment source | Attending: Internal Medicine

## 2014-06-28 DIAGNOSIS — I252 Old myocardial infarction: Secondary | ICD-10-CM | POA: Diagnosis not present

## 2014-06-28 DIAGNOSIS — R0989 Other specified symptoms and signs involving the circulatory and respiratory systems: Secondary | ICD-10-CM | POA: Insufficient documentation

## 2014-06-28 LAB — GLUCOSE, CAPILLARY
GLUCOSE-CAPILLARY: 103 mg/dL — AB (ref 70–99)
Glucose-Capillary: 103 mg/dL — ABNORMAL HIGH (ref 70–99)

## 2014-06-29 ENCOUNTER — Encounter: Payer: Self-pay | Admitting: Internal Medicine

## 2014-06-29 ENCOUNTER — Non-Acute Institutional Stay (SKILLED_NURSING_FACILITY): Payer: Medicare Other | Admitting: Internal Medicine

## 2014-06-29 DIAGNOSIS — D649 Anemia, unspecified: Secondary | ICD-10-CM | POA: Diagnosis not present

## 2014-06-29 DIAGNOSIS — R635 Abnormal weight gain: Secondary | ICD-10-CM | POA: Insufficient documentation

## 2014-06-29 DIAGNOSIS — I5021 Acute systolic (congestive) heart failure: Secondary | ICD-10-CM

## 2014-06-29 DIAGNOSIS — E876 Hypokalemia: Secondary | ICD-10-CM | POA: Insufficient documentation

## 2014-06-29 LAB — GLUCOSE, CAPILLARY
GLUCOSE-CAPILLARY: 108 mg/dL — AB (ref 70–99)
Glucose-Capillary: 93 mg/dL (ref 70–99)

## 2014-06-29 NOTE — Progress Notes (Signed)
Patient ID: Roy [name], male   DOB: [birth year], 68 y.o.   MRN: [id number]   This is an acute visit.  Level of care skilled.  Facility [facility].   Chief complaint acute visit follow up weight gain with history of CHF--anemia   History of present illness.   Patient is a 68 year old male was admitted to the hospital respiratory distress.  He was admitted with COPD and pneumonia as well as congestive heart failure.  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  He underwent medical management with aspirin Plavix beta blocker and statin-patient went into severe DTs secondary to alcohol withdrawal and was not thought to be a candidate for cardiac catheterization.  He has had hypernatremia she was thought secondary to his diet of thickened liquids-be doing better now.  However this appears to have normalized recently he is doing better with his drinking as well as eating according to nursing staff.  His weights have been monitored and appears he  gradually gainied weight on admission on July 28 it was 139.4 it rose to 144.2 on the 29th-146 on July 31 and on 8-02  was 148.8.--His weight was 147.8 yesterday Monday-today's weight is currently unavailable  He was not on diuretics secondary to his sodium issues however Lasix 20 mg a day was started on August 1 by on call provider-and increased to 20 mg twice a day the next day.  It appears his weight has leveled off-although today's weight is not available  A chest x-ray was done yesterday which did not show any active cardiopulmonary process actually showed improved variation of the lungs without edema   Clinically he appears to be stable he does not exhibit really any increased edema or complaints of shortness of breath or chest pain  Labs were ordered which does show a potassium of 3.2 as of yesterday I suspect this is due to diuresis with Lasix-his BUN is 8 creatinine 0.63 sodium  141.  He did have a pro BNP done which was 3980 which actually appears somewhat improved from his hospital levels which had risen over 5000 at one point.  He also ordered a CBC which is significant for hemoglobin of 9.2 it appears the last hemoglobin in the hospital on July 24 was 10.9 and had one point had been over 12 Does have a history of alcoholic cirrhosis-it appears there has been a workup earlier this year for melena--he was put on a proton pump inhibitor which he remains on twice a day-apparently endoscopy and colonoscopy did not show any acute reason for the bleed-apparently there was some gastritis  .  Family medical social history has been reviewed per admission note on 06/23/2014.   Medications have been reviewed per MAR .  Review of systems.  Quite limited secondary to patient's cognitive issues.  However respiratory no complaints shortness of breath.  Cardiac no chest pain.  GI no abdominal pain  -has been eating welll nursing staff concurs with this.  Mental status is at baseline cognitive difficulties which is not new.   Physical exam.   Temperature 97.9 pulse 80 respirations 18 blood pressure 114/78 weight 147.8.  In general this is a pleasant elderly male in no distress sitting comfortably in his wheelchair.  His chest is clear to auscultation there is no labored breathing.  Heart sounds are distant JVP does not appear to be elevated there is no significant lower extremity edema or sacral edema.  Abdomen is soft nontender with positive bowel sounds.  Neurologic appears to be grossly intact he does move all extremities x4 he is able to stand without assistance although quite unsteady Muscle skeletal-as noted above does move all extremities x4 do not note any deformities.  Psych he is alert and oriented to self did cooperate with simple verbal problems.   Labs  06/28/2014.  Sodium 141 potassium 32 BUN 8 creatinine 0.63-liver function tests within normal  limits.  WBC 4.9 hemoglobin 9.2 platelets 267.  [name]  06/27/2014.  Sodium 141 potassium 3.4 BUN 7 creatinine 0.62.  06/25/2014.  Sodium 143 potassium 3.5 BUN 11 creatinine 0.68.   Assessment and plan.  #1-weight gain-there are concerns here with his history of CHF-with significantly reduced ejection fraction-however clinically he appears to be quite stable here with no signs of significant edema--or shortness of breath or chest pain-he has been started on Lasix 20 mg twice a day potassium supplementation also was started-potassium is trending lower will increase his potassium to 20 mEq twice a day and updated metabolic panel tomorrow--  His weight appears to have stabilized  His chest x-ray was reassuring-depending on renal function and clinical status consider reducing his Lasix at some point his sodium again appears to be stable as well  #2 anemia-it appears his hemoglobin has dropped over the past couple weeks from over 12 to currently 9.2-this appears to be a normocytic anemia-Will test occult rectal blood x3-also will order iron studies including reticulocyte count ferritin B12 folate total iron binding capacity and iron serum level   [id number]--of note greater than 25 minutes spent assessing patient-discussing his status with nursing staff as well as reviewing his chart-and coordinating and  formulating a plan of care-of note greater than 50% of time spent coordinating plan of care   .

## 2014-06-30 ENCOUNTER — Non-Acute Institutional Stay (SKILLED_NURSING_FACILITY): Payer: Medicare Other | Admitting: Internal Medicine

## 2014-06-30 ENCOUNTER — Encounter: Payer: Self-pay | Admitting: Internal Medicine

## 2014-06-30 DIAGNOSIS — D649 Anemia, unspecified: Secondary | ICD-10-CM | POA: Diagnosis not present

## 2014-06-30 DIAGNOSIS — E876 Hypokalemia: Secondary | ICD-10-CM

## 2014-06-30 DIAGNOSIS — I5033 Acute on chronic diastolic (congestive) heart failure: Secondary | ICD-10-CM | POA: Diagnosis not present

## 2014-06-30 LAB — GLUCOSE, CAPILLARY: Glucose-Capillary: 94 mg/dL (ref 70–99)

## 2014-06-30 NOTE — Progress Notes (Signed)
Patient ID: Roy [name], male   DOB: 1946/05/25, 68 y.o.   MRN: [id number]   this is an acute visit.  Level of care skilled.  Facility [facility].  Chief complaint-acute visit followup hypokalemia CHF.  History of present illness.  Patient is a 68 year old male with a history of respiratory distress COPD pneumonia as well as congestive heart failure.  In the hospital he was found to have an elevated troponin and a severe systolic cardiomyopathy with an ejection fraction of 20%.  This was medically managed with Plavix beta blocker statin and aspirin  Initially after admission he gained weight rising from 139.4-144.2-up to 146 on July 31 in on August 2 was 148.8.  However 2 days ago his weight stabilized at 147.8 and today is stable again at 148.   his diuretics were discontinued in the hospital secondary to hypernatremia t however Lasix 20 mg a day was started on August 1 by the on-call provider and increase to 20 mg twice a day the next day-which he continues on.  He was started on low-dose potassium potassium on lab from August 3 was low at 3.2 and I increased his potassium to 20 mEq twice a day with a lab drawn today and this has actually normalized at 3.6-  Also. His hemoglobin has trended down on lab earlier this week was 9.2 repeat labs show some improvement at 9.6-iron studies were ordered as well as reticulocyte count B12 and ferritin so far retic counth, has come back and that is 1.49  He does not report any increase shortness of breath or chest pain or increased weakness ambulates about in his wheelchair energy appears to be at baseline.  Family medical social history as been reviewed per admission note on 06/23/2014.  Medications have been reviewed per MAR.  Review of systems.  Limited. Patient's limited cognitive ability with confusion-.  Respiratory denies any shortness of breath or cough.  Cardiac no chest pain has some mild lower extremity edema.  GI does not  complaining of any abdominal discomfort apparently feeding quite well.  Physical exam.  Temperature is 98.1 pulse 97 respirations 20 blood pressure 99/63 O2 saturation is in the high 90s-100.  In general this is a pleasant elderly male in no distress sitting comfortably in his wheelchair.  His chest is clear to auscultation there is no labored breathing.  Heart sounds continue to be distant regular rate and rhythm 1r he has very minimal pedal edema he is wearing socks it looks like there is some mild edema mid lower leg--superior to where he is wearing socks.  His abdomen is soft nontender with positive bowel sounds.  Psych he is oriented to self. Cooperate with simple verbal commands.  Labs.  06/30/2014.  WBC 4.7 hemoglobin 9.6 platelets 265.  Reticulocyte percentage 1.49.  Sodium 140 potassium 3.6 BUN 6 creatinine 0.66.  May need to level I.4.  Assessment and plan.  #1-history of CHF-this appears stable weight clinically-he continues on Lasix 20 mg  Twice  a day -BNP -recently was 3980 is somewhat improved from previous BNPs in hospital--continue to monitor weight closely as well as clinical status.  #2 hypokalemia this appears to have resolved with higher doses of potassium will continue 20 mEq twice a day through today and then start 20 mEq a day-update a BMP in 2 days.  #3 anemia this appears to have stabilized although will have to be monitored Will await further studies ordered yesterday-also will obtain a CBC when metabolic panel drawn  on Friday--clinically he appears stable in this regard -- order to test  Rectal  occult blood has been ordered as well-  [id number]      .

## 2014-07-01 LAB — GLUCOSE, CAPILLARY
GLUCOSE-CAPILLARY: 134 mg/dL — AB (ref 70–99)
Glucose-Capillary: 126 mg/dL — ABNORMAL HIGH (ref 70–99)
Glucose-Capillary: 106 mg/dL — ABNORMAL HIGH (ref 70–99)

## 2014-07-02 LAB — GLUCOSE, CAPILLARY
GLUCOSE-CAPILLARY: 91 mg/dL (ref 70–99)
GLUCOSE-CAPILLARY: 180 mg/dL — AB (ref 70–99)

## 2014-07-03 LAB — GLUCOSE, CAPILLARY
GLUCOSE-CAPILLARY: 91 mg/dL (ref 70–99)
GLUCOSE-CAPILLARY: 206 mg/dL — AB (ref 70–99)

## 2014-07-04 LAB — GLUCOSE, CAPILLARY
Glucose-Capillary: 111 mg/dL — ABNORMAL HIGH (ref 70–99)
Glucose-Capillary: 155 mg/dL — ABNORMAL HIGH (ref 70–99)

## 2014-07-05 DIAGNOSIS — F411 Generalized anxiety disorder: Secondary | ICD-10-CM | POA: Diagnosis not present

## 2014-07-05 DIAGNOSIS — F29 Unspecified psychosis not due to a substance or known physiological condition: Secondary | ICD-10-CM | POA: Diagnosis not present

## 2014-07-05 LAB — GLUCOSE, CAPILLARY
GLUCOSE-CAPILLARY: 121 mg/dL — AB (ref 70–99)
Glucose-Capillary: 95 mg/dL (ref 70–99)

## 2014-07-06 LAB — GLUCOSE, CAPILLARY: GLUCOSE-CAPILLARY: 101 mg/dL — AB (ref 70–99)

## 2014-07-07 ENCOUNTER — Non-Acute Institutional Stay (SKILLED_NURSING_FACILITY): Payer: Medicare Other | Admitting: Internal Medicine

## 2014-07-07 DIAGNOSIS — D649 Anemia, unspecified: Secondary | ICD-10-CM

## 2014-07-07 LAB — GLUCOSE, CAPILLARY
GLUCOSE-CAPILLARY: 144 mg/dL — AB (ref 70–99)
Glucose-Capillary: 134 mg/dL — ABNORMAL HIGH (ref 70–99)
Glucose-Capillary: 103 mg/dL — ABNORMAL HIGH (ref 70–99)

## 2014-07-08 LAB — GLUCOSE, CAPILLARY
Glucose-Capillary: 107 mg/dL — ABNORMAL HIGH (ref 70–99)
Glucose-Capillary: 129 mg/dL — ABNORMAL HIGH (ref 70–99)

## 2014-07-08 NOTE — Progress Notes (Signed)
Patient ID: Roy [name], male   DOB: October 24, 1946, 68 y.o.   MRN: [id number]           PROGRESS NOTE  DATE: 07/07/2014          FACILITY:  [facility]  LEVEL OF CARE: SNF (31)  Acute Visit  CHIEF COMPLAINT:  Manage anemia.     HISTORY OF PRESENT ILLNESS: I was requested by the staff to assess the patient regarding above problem(s):  ANEMIA: The anemia has been stable. The patient denies fatigue, melena or hematochezia. No complications from the medications currently being used.  On 07/05/2014:  Hemoglobin 10, MCV 86.8.  On 07/02/2014:  Hemoglobin 9.4.  On 06/30/2014:  Serum iron level 42, TIBC 313, percent saturation 13, ferritin 46, vitamin B12 level 1385, folate greater than 20.    PAST MEDICAL HISTORY : Reviewed.  No changes/see problem list  CURRENT MEDICATIONS: Reviewed per MAR/see medication list  PHYSICAL EXAMINATION  VS: see VS section  GENERAL: no acute distress, normal body habitus RESPIRATORY: breathing is even & unlabored, BS CTAB CARDIAC: RRR, no murmur,no extra heart sounds, no edema  ASSESSMENT/PLAN:  Anemia.  Hemoglobin improved.   No sign of iron deficiency.    CPT CODE: 02585          Gayani Y Dasanayaka, [place] [phone]

## 2014-07-09 LAB — GLUCOSE, CAPILLARY
Glucose-Capillary: 106 mg/dL — ABNORMAL HIGH (ref 70–99)
Glucose-Capillary: 112 mg/dL — ABNORMAL HIGH (ref 70–99)

## 2014-07-10 LAB — GLUCOSE, CAPILLARY
GLUCOSE-CAPILLARY: 101 mg/dL — AB (ref 70–99)
GLUCOSE-CAPILLARY: 153 mg/dL — AB (ref 70–99)

## 2014-07-11 LAB — GLUCOSE, CAPILLARY
GLUCOSE-CAPILLARY: 98 mg/dL (ref 70–99)
GLUCOSE-CAPILLARY: 263 mg/dL — AB (ref 70–99)
Glucose-Capillary: 216 mg/dL — ABNORMAL HIGH (ref 70–99)

## 2014-07-12 LAB — GLUCOSE, CAPILLARY: Glucose-Capillary: 95 mg/dL (ref 70–99)

## 2014-07-13 LAB — GLUCOSE, CAPILLARY
GLUCOSE-CAPILLARY: 103 mg/dL — AB (ref 70–99)
Glucose-Capillary: 79 mg/dL (ref 70–99)

## 2014-07-14 LAB — GLUCOSE, CAPILLARY
GLUCOSE-CAPILLARY: 107 mg/dL — AB (ref 70–99)
GLUCOSE-CAPILLARY: 127 mg/dL — AB (ref 70–99)

## 2014-07-15 LAB — GLUCOSE, CAPILLARY
Glucose-Capillary: 95 mg/dL (ref 70–99)
Glucose-Capillary: 171 mg/dL — ABNORMAL HIGH (ref 70–99)

## 2014-07-16 LAB — GLUCOSE, CAPILLARY
GLUCOSE-CAPILLARY: 106 mg/dL — AB (ref 70–99)
GLUCOSE-CAPILLARY: 163 mg/dL — AB (ref 70–99)

## 2014-07-17 LAB — GLUCOSE, CAPILLARY
GLUCOSE-CAPILLARY: 166 mg/dL — AB (ref 70–99)
Glucose-Capillary: 100 mg/dL — ABNORMAL HIGH (ref 70–99)

## 2014-07-18 LAB — GLUCOSE, CAPILLARY
GLUCOSE-CAPILLARY: 106 mg/dL — AB (ref 70–99)
Glucose-Capillary: 148 mg/dL — ABNORMAL HIGH (ref 70–99)

## 2014-07-19 ENCOUNTER — Ambulatory Visit (HOSPITAL_COMMUNITY)
Admit: 2014-07-19 | Discharge: 2014-07-19 | Disposition: A | Discharge status: Home / Self Care | Payer: Medicare Other | Attending: Internal Medicine | Admitting: Internal Medicine

## 2014-07-19 ENCOUNTER — Ambulatory Visit (HOSPITAL_COMMUNITY)
Admit: 2014-07-19 | Discharge: 2014-07-19 | Disposition: A | Discharge status: Home / Self Care | Payer: No Typology Code available for payment source | Source: Ambulatory Visit | Attending: Internal Medicine | Admitting: Internal Medicine

## 2014-07-19 DIAGNOSIS — R1310 Dysphagia, unspecified: Secondary | ICD-10-CM | POA: Insufficient documentation

## 2014-07-19 LAB — GLUCOSE, CAPILLARY: Glucose-Capillary: 120 mg/dL — ABNORMAL HIGH (ref 70–99)

## 2014-07-19 NOTE — Procedures (Signed)
Objective Swallowing Evaluation: Modified Barium Swallowing Study  Patient Details  Name: BUCK MCAFFEE MRN: [id number] Date of Birth: Jun 08, 1946  Today's Date: 07/19/2014 Time: 1:07 PM - 1:29 PM    Past Medical History:  Past Medical History  Diagnosis Date  . Cerebrovascular disease 2009    TIA; 2009- right ICA stent; re-intervention for restenosis complicated by [facility] w/o sx  . Degenerative joint disease     Total shoulder arthroplasty-right  . LBBB (left bundle branch block)     Normal echo-2011; stress nuclear in 09/2010--septal hypoperfusion representing nontransmural infarction or the effect of left bundle branch block, no ischemia  . Hyperlipidemia   . Hypertension   . Chronic obstructive pulmonary disease   . Tobacco abuse     -100 pack years; 1.5 packs per day  . Anxiety and depression   . Alcohol abuse     6 beers per day; hospital admission in 2009 for withdrawal symptoms  . Thrombocytopenia   . Tubular adenoma of colon    Past Surgical History:  Past Surgical History  Procedure Laterality Date  . Lumbar fusion  2010  . Vasectomy  1971  . Colonoscopy w/ polypectomy  2011  . Total shoulder arthroplasty  2011    Right-Dr. Tamera Punt  . Colonoscopy  08/22/09    Fields-(Tubular Adenoma)3-mm transverse polyp/4-mm polyp otherwise noraml/small internal hemorrhoids  . Esophagogastroduodenoscopy [name] 03/05/2014    Procedure: ESOPHAGOGASTRODUODENOSCOPY (EGD);  Surgeon: Danie Binder, MD;  [facility];  Service: Endoscopy;  Laterality: [name];  11:45  . Agile capsule [name] 03/18/2014    Procedure: AGILE CAPSULE;  Surgeon: Danie Binder, MD;  [facility];  Service: Endoscopy;  Laterality: [name];  7:30  . Givens capsule study [name] 03/30/2014    Procedure: GIVENS CAPSULE STUDY;  Surgeon: Danie Binder, MD;  [facility];  Service: Endoscopy;  Laterality: [name];  7:30  . Colonoscopy [name] 04/14/2014    Procedure: COLONOSCOPY;  Surgeon: Danie Binder, MD;   [facility];  Service: Endoscopy;  Laterality: [name];  9:30   HPI:  Mr. Ammaar Encina is a 68 year old male with a history of respiratory distress COPD pneumonia as well as congestive heart failure. He is currently receiving therapy at [facility] following recent hospitalization. PMH: 68 year old male, current smoker with PMH of COPD, alcohol abuse, chronic bronchitis, TIA, HTN, LBBB who presented to [facility] ED via EMS 7/9 for progressively worsening SOB and respiratory distress. Transferred to [facility]. Intubated 7/10-7/17. NSTEMI. He was discharged from [facility] 06/22/2014 to [facility]. He has been consuming a mechanical soft diet with nectar-thick liquids. Last MBSS completed 06/14/2014.     Recommendation/Prognosis  Clinical Impression:   Dysphagia Diagnosis: Within Functional Limits Clinical impression: Overall swallow function is WFL. Pt with mild premature spillage (variable) with cup and straw sips thin liquid. Recommend upgrade to regular textures and thin liquids.    Swallow Evaluation Recommendations:  Diet Recommendations: Regular;Thin liquid Medication Administration: Whole meds with liquid Supervision: Patient able to self feed Postural Changes and/or Swallow Maneuvers: Out of bed for meals;Seated upright 90 degrees;Upright 30-60 min after meal Oral Care Recommendations: Oral care BID Other Recommendations: Clarify dietary restrictions Follow up Recommendations: None    Prognosis:   Good   Individuals Consulted: Consulted and Agree with Results and Recommendations: Patient Report Sent to : Facility (Comment);Primary SLP    General: Type of Study: Modified Barium Swallowing Study Reason for Referral: Objectively evaluate swallowing function Previous Swallow Assessment: 06/14/2014-  D1/NTL Diet Prior to this Study: Dysphagia 3 (soft);Nectar-thick liquids Temperature Spikes Noted: No Respiratory Status: Room air History of Recent Intubation: No Behavior/Cognition: Alert;Cooperative Oral  Cavity - Dentition: Edentulous Oral Motor / Sensory Function: Within functional limits Self-Feeding Abilities: Able to feed self Patient Positioning: Upright in chair Baseline Vocal Quality: Clear;Breathy Volitional Cough: Strong Volitional Swallow: Able to elicit Anatomy: Within functional limits Pharyngeal Secretions: Not observed secondary MBS   Reason for Referral:   Objectively evaluate swallowing function    Oral Phase: Oral Preparation/Oral Phase Oral Phase: WFL Oral - Nectar Oral - Nectar Cup: Not tested Oral - Thin Oral - Thin Cup: Within functional limits Oral - Thin Straw: Within functional limits Oral - Solids Oral - Puree: Within functional limits Oral - Mechanical Soft: Within functional limits Oral - Pill: Within functional limits   Pharyngeal Phase:  Pharyngeal Phase Pharyngeal Phase: Within functional limits   Cervical Esophageal Phase  Cervical Esophageal Phase Cervical Esophageal Phase: WFL (prominent CP)   GN  Functional Assessment Tool Used: MBSS Functional Limitations: Swallowing Swallow Current Status (K8127): 0 percent impaired, limited or restricted Swallow Discharge Status (N1700): 0 percent impaired, limited or restricted     Thank you,  Genene Churn, [place]   [place] 07/19/2014, 4:59 PM

## 2014-07-20 LAB — GLUCOSE, CAPILLARY
GLUCOSE-CAPILLARY: 128 mg/dL — AB (ref 70–99)
Glucose-Capillary: 92 mg/dL (ref 70–99)

## 2014-07-21 DIAGNOSIS — F29 Unspecified psychosis not due to a substance or known physiological condition: Secondary | ICD-10-CM | POA: Diagnosis not present

## 2014-07-21 DIAGNOSIS — F411 Generalized anxiety disorder: Secondary | ICD-10-CM | POA: Diagnosis not present

## 2014-07-21 LAB — GLUCOSE, CAPILLARY
GLUCOSE-CAPILLARY: 106 mg/dL — AB (ref 70–99)
GLUCOSE-CAPILLARY: 95 mg/dL (ref 70–99)
GLUCOSE-CAPILLARY: 141 mg/dL — AB (ref 70–99)

## 2014-07-22 LAB — GLUCOSE, CAPILLARY
Glucose-Capillary: 110 mg/dL — ABNORMAL HIGH (ref 70–99)
Glucose-Capillary: 130 mg/dL — ABNORMAL HIGH (ref 70–99)

## 2014-07-23 LAB — GLUCOSE, CAPILLARY
GLUCOSE-CAPILLARY: 123 mg/dL — AB (ref 70–99)
GLUCOSE-CAPILLARY: 100 mg/dL — AB (ref 70–99)
Glucose-Capillary: 101 mg/dL — ABNORMAL HIGH (ref 70–99)

## 2014-07-24 ENCOUNTER — Non-Acute Institutional Stay (SKILLED_NURSING_FACILITY): Payer: Medicare Other | Admitting: Internal Medicine

## 2014-07-24 DIAGNOSIS — I5033 Acute on chronic diastolic (congestive) heart failure: Secondary | ICD-10-CM

## 2014-07-24 DIAGNOSIS — J449 Chronic obstructive pulmonary disease, unspecified: Secondary | ICD-10-CM

## 2014-07-24 DIAGNOSIS — G9340 Encephalopathy, unspecified: Secondary | ICD-10-CM

## 2014-07-24 DIAGNOSIS — I214 Non-ST elevation (NSTEMI) myocardial infarction: Secondary | ICD-10-CM | POA: Diagnosis not present

## 2014-07-24 DIAGNOSIS — K7030 Alcoholic cirrhosis of liver without ascites: Secondary | ICD-10-CM

## 2014-07-24 DIAGNOSIS — D649 Anemia, unspecified: Secondary | ICD-10-CM | POA: Diagnosis not present

## 2014-07-24 LAB — GLUCOSE, CAPILLARY
GLUCOSE-CAPILLARY: 115 mg/dL — AB (ref 70–99)
Glucose-Capillary: 100 mg/dL — ABNORMAL HIGH (ref 70–99)

## 2014-07-24 NOTE — Progress Notes (Signed)
Patient ID: Roy [name], male   DOB: [birth year], 68 y.o.   MRN: [id number]   Addendum-of note patient does have a nicotine patch however he states he probably will smoke some once he goes home although I certainly advised him against this saying he would benefit by stopping smoking especially since he has not had a cigarette in several weeks and this will be a good time to try to quit completely-he expressed understanding but stated it is likely at least he will have a few cigarettes which would contraindicate the patch therefore I will discontinue this-hopefully he will be able to successfully stop at some point-the states at this time he he suspects he will be smoking at least some.  Also note he has not really used his nebulizers so will discontinue this certainly if COPD becomes an issue certainly may need these with followup by primary care provider  He does also have a history of anxiety and has benefited from Ativan he gets this routinely will write prescription for this.  Also has a history of questionable diabetes but blood sugars have been quite satisfactory ranging from the 90s to low 100s --t followup by primary care provider as well as home health as needed

## 2014-07-24 NOTE — Progress Notes (Signed)
Patient ID: Roy [name], male   DOB: 02/27/1946, 68 y.o.   MRN: [id number]   This is a discharge  visit.  Level of care skilled.  Facility [facility] .  Chief complaint-Discharge note   History of present illness.  Patient is a 68 year old male was admitted to the hospital respiratory distress.  He was admitted with COPD and pneumonia as well as congestive heart failure.  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  He underwent medical management with aspirin Plavix beta blocker and statin-patient went into severe DTs secondary to alcohol withdrawal and was not thought to be a candidate for cardiac catheterization.  He has had hypernatremia she was thought secondary to his diet of thickened liquids-be doing better now.  However this appears to have normalized recently he is doinmuch better with his drinking as well as eating according to nursing staff--  He appears to have done very well during his stay here as she has gained some weight although I suspect this is appetite related-has not complained of any chest pain or shortness of breath-  Most recent sodium is 141 on August 19 this appears to have stabilized-he did have some low potassium and this has been supplemented as well.  He does continue on Lasix again with a history of diastolic CHF.  Patient also initially had his hemoglobin dropped somewhat but this appears to have rebounded most recent hemoglobin is 10.0 which appears to be stable we will update this.  Today again he has no complaints is looking forward to going home or he does have a supportive family.  He will need continued PT and OT.  Family medical social history as been reviewed per admission note on 06/23/2014.  Medications have been reviewed per MAR.  Review of systems.  In general no complaints of fever chills after some initial weight gain this appears to have stabilized.  Skin does not  complaining of rash or itching.  Head ears eyes nose mouth and throat-does not complaining of visual changes or sore throat or nasal discharge.  Respiratory history COPD but does not complaining of cough and shortness of breath even with exertion.  Cardiac does not complaining of chest pain does not really have significant lower extremity edema.  GI no complaints of abdominal pain has a good appetite no nausea or vomiting noted.  Muscle skeletal-has gained significant strength is not complaining of any joint pain or swelling.  Neurologic no complaints of dizziness headache or syncopal-type feelings.  Psych initially had some encephalopathy this appears to have largely resolved he is pleasant oriented and conversant today.  Physical exam.  Temperature is 98.1 pulse 86 respirations 22 blood pressure 124/72-weight is 149 this appears to be stable.  In general this is a pleasant elderly male in no distress.  His skin is warm and dry.  Eyes he has prescription lenses visual daily appears intact.  Oropharynx is clear mucous membranes moist.  Chest is clear to auscultation with somewhat shallow air entry no labored breathing.  Heart is regular rate and rhythm somewhat distant heart sounds he does not have significant lower extremity edema.  Abdomen is soft nontender positive bowel sounds.  Muscle skeletal did not note any deformities he is ambulatory appears to be walking well but would probably benefit from additional therapy he doet use a walker at times  Neurologic-grossly intact I do not see any lateralizing findings his speech is clear.  Psych --  alert and oriented pleasant and appropriate.  Labs.  07/14/2014.  Sodium 141 potassium 3.7 BUN 7 creatinine 0.59.  07/05/2014.  WBC 5.2 hemoglobin 10.0 platelets 283.  Sodium 139 potassium 3.7 BUN 8 creatinine 0.62.  Assessment and plan.   .  #1 COPD acute this appears to be fairly stable has when necessary  nebulizers--- .  #2 non-ST elevation MI with a severe cardiomyopathy [question partially alcohol-induced] right now he is not in active heart failure --this appears to be stable-he is on Lasix 20 mg twice a day along with potassium supplementation at this point appears stable  not a candidate for an ACE inhibitor due to acute renal insufficiency --he is on a beta blocker as well as Plavix and aspirin #3 hypernatremia--this has not really been an issue during his stay here but will update metabolic panel .  #4 severe alcohol withdrawal. He is placed on Risperdal which I am assuming was related to this. --He has been followed by psychiatric services during his stay here--he appears to be doing very well with regards no behaviors have been noted by nursing staff to my knowledge-his confusion appears to be considerably improved   #5 significant cognitive issues.-As noted above this has improved-  .  #6 pneumonia it appears that he has been successfully treated.  #7 severe gait ataxia--this as well has certainly improved he would benefit from further strengthening with PT and OT however  #8-anemia-this appears to have stabilized will update CBC   [id number];

## 2014-07-25 LAB — GLUCOSE, CAPILLARY
Glucose-Capillary: 118 mg/dL — ABNORMAL HIGH (ref 70–99)
Glucose-Capillary: 204 mg/dL — ABNORMAL HIGH (ref 70–99)

## 2014-07-26 ENCOUNTER — Encounter: Payer: Self-pay | Admitting: *Deleted

## 2014-07-26 LAB — GLUCOSE, CAPILLARY: Glucose-Capillary: 113 mg/dL — ABNORMAL HIGH (ref 70–99)

## 2014-07-28 DIAGNOSIS — F17200 Nicotine dependence, unspecified, uncomplicated: Secondary | ICD-10-CM | POA: Diagnosis not present

## 2014-07-28 DIAGNOSIS — Z5189 Encounter for other specified aftercare: Secondary | ICD-10-CM | POA: Diagnosis not present

## 2014-07-28 DIAGNOSIS — I509 Heart failure, unspecified: Secondary | ICD-10-CM | POA: Diagnosis not present

## 2014-07-28 DIAGNOSIS — E785 Hyperlipidemia, unspecified: Secondary | ICD-10-CM | POA: Diagnosis not present

## 2014-07-28 DIAGNOSIS — R279 Unspecified lack of coordination: Secondary | ICD-10-CM | POA: Diagnosis not present

## 2014-07-28 DIAGNOSIS — I5022 Chronic systolic (congestive) heart failure: Secondary | ICD-10-CM | POA: Diagnosis not present

## 2014-07-28 DIAGNOSIS — I10 Essential (primary) hypertension: Secondary | ICD-10-CM | POA: Diagnosis not present

## 2014-07-28 DIAGNOSIS — I259 Chronic ischemic heart disease, unspecified: Secondary | ICD-10-CM | POA: Diagnosis not present

## 2014-07-28 DIAGNOSIS — J4489 Other specified chronic obstructive pulmonary disease: Secondary | ICD-10-CM | POA: Diagnosis not present

## 2014-07-28 DIAGNOSIS — D510 Vitamin B12 deficiency anemia due to intrinsic factor deficiency: Secondary | ICD-10-CM | POA: Diagnosis not present

## 2014-07-28 DIAGNOSIS — I214 Non-ST elevation (NSTEMI) myocardial infarction: Secondary | ICD-10-CM | POA: Diagnosis not present

## 2014-07-28 DIAGNOSIS — J449 Chronic obstructive pulmonary disease, unspecified: Secondary | ICD-10-CM | POA: Diagnosis not present

## 2014-07-29 DIAGNOSIS — I5022 Chronic systolic (congestive) heart failure: Secondary | ICD-10-CM | POA: Diagnosis not present

## 2014-07-29 DIAGNOSIS — I509 Heart failure, unspecified: Secondary | ICD-10-CM | POA: Diagnosis not present

## 2014-07-29 DIAGNOSIS — Z5189 Encounter for other specified aftercare: Secondary | ICD-10-CM | POA: Diagnosis not present

## 2014-07-29 DIAGNOSIS — R279 Unspecified lack of coordination: Secondary | ICD-10-CM | POA: Diagnosis not present

## 2014-07-29 DIAGNOSIS — J449 Chronic obstructive pulmonary disease, unspecified: Secondary | ICD-10-CM | POA: Diagnosis not present

## 2014-07-29 DIAGNOSIS — I214 Non-ST elevation (NSTEMI) myocardial infarction: Secondary | ICD-10-CM | POA: Diagnosis not present

## 2014-08-02 DIAGNOSIS — J449 Chronic obstructive pulmonary disease, unspecified: Secondary | ICD-10-CM | POA: Diagnosis not present

## 2014-08-02 DIAGNOSIS — R279 Unspecified lack of coordination: Secondary | ICD-10-CM | POA: Diagnosis not present

## 2014-08-02 DIAGNOSIS — I509 Heart failure, unspecified: Secondary | ICD-10-CM | POA: Diagnosis not present

## 2014-08-02 DIAGNOSIS — I214 Non-ST elevation (NSTEMI) myocardial infarction: Secondary | ICD-10-CM | POA: Diagnosis not present

## 2014-08-02 DIAGNOSIS — I5022 Chronic systolic (congestive) heart failure: Secondary | ICD-10-CM | POA: Diagnosis not present

## 2014-08-02 DIAGNOSIS — Z5189 Encounter for other specified aftercare: Secondary | ICD-10-CM | POA: Diagnosis not present

## 2014-08-04 ENCOUNTER — Encounter: Payer: Self-pay | Admitting: Gastroenterology

## 2014-08-04 DIAGNOSIS — I509 Heart failure, unspecified: Secondary | ICD-10-CM | POA: Diagnosis not present

## 2014-08-04 DIAGNOSIS — Z5189 Encounter for other specified aftercare: Secondary | ICD-10-CM | POA: Diagnosis not present

## 2014-08-04 DIAGNOSIS — I214 Non-ST elevation (NSTEMI) myocardial infarction: Secondary | ICD-10-CM | POA: Diagnosis not present

## 2014-08-04 DIAGNOSIS — R279 Unspecified lack of coordination: Secondary | ICD-10-CM | POA: Diagnosis not present

## 2014-08-04 DIAGNOSIS — J449 Chronic obstructive pulmonary disease, unspecified: Secondary | ICD-10-CM | POA: Diagnosis not present

## 2014-08-04 DIAGNOSIS — I5022 Chronic systolic (congestive) heart failure: Secondary | ICD-10-CM | POA: Diagnosis not present

## 2014-08-06 DIAGNOSIS — Z5189 Encounter for other specified aftercare: Secondary | ICD-10-CM | POA: Diagnosis not present

## 2014-08-06 DIAGNOSIS — I214 Non-ST elevation (NSTEMI) myocardial infarction: Secondary | ICD-10-CM | POA: Diagnosis not present

## 2014-08-06 DIAGNOSIS — J449 Chronic obstructive pulmonary disease, unspecified: Secondary | ICD-10-CM | POA: Diagnosis not present

## 2014-08-06 DIAGNOSIS — I5022 Chronic systolic (congestive) heart failure: Secondary | ICD-10-CM | POA: Diagnosis not present

## 2014-08-06 DIAGNOSIS — R279 Unspecified lack of coordination: Secondary | ICD-10-CM | POA: Diagnosis not present

## 2014-08-06 DIAGNOSIS — I509 Heart failure, unspecified: Secondary | ICD-10-CM | POA: Diagnosis not present

## 2014-08-09 DIAGNOSIS — Z5189 Encounter for other specified aftercare: Secondary | ICD-10-CM | POA: Diagnosis not present

## 2014-08-09 DIAGNOSIS — R279 Unspecified lack of coordination: Secondary | ICD-10-CM | POA: Diagnosis not present

## 2014-08-09 DIAGNOSIS — J449 Chronic obstructive pulmonary disease, unspecified: Secondary | ICD-10-CM | POA: Diagnosis not present

## 2014-08-09 DIAGNOSIS — I509 Heart failure, unspecified: Secondary | ICD-10-CM | POA: Diagnosis not present

## 2014-08-09 DIAGNOSIS — I214 Non-ST elevation (NSTEMI) myocardial infarction: Secondary | ICD-10-CM | POA: Diagnosis not present

## 2014-08-09 DIAGNOSIS — I5022 Chronic systolic (congestive) heart failure: Secondary | ICD-10-CM | POA: Diagnosis not present

## 2014-08-12 DIAGNOSIS — I214 Non-ST elevation (NSTEMI) myocardial infarction: Secondary | ICD-10-CM | POA: Diagnosis not present

## 2014-08-12 DIAGNOSIS — I5022 Chronic systolic (congestive) heart failure: Secondary | ICD-10-CM | POA: Diagnosis not present

## 2014-08-12 DIAGNOSIS — Z5189 Encounter for other specified aftercare: Secondary | ICD-10-CM | POA: Diagnosis not present

## 2014-08-12 DIAGNOSIS — I509 Heart failure, unspecified: Secondary | ICD-10-CM | POA: Diagnosis not present

## 2014-08-12 DIAGNOSIS — J449 Chronic obstructive pulmonary disease, unspecified: Secondary | ICD-10-CM | POA: Diagnosis not present

## 2014-08-12 DIAGNOSIS — R279 Unspecified lack of coordination: Secondary | ICD-10-CM | POA: Diagnosis not present

## 2014-08-20 ENCOUNTER — Ambulatory Visit (INDEPENDENT_AMBULATORY_CARE_PROVIDER_SITE_OTHER): Payer: Medicare Other | Admitting: Cardiovascular Disease

## 2014-08-20 ENCOUNTER — Encounter: Payer: Self-pay | Admitting: Cardiovascular Disease

## 2014-08-20 VITALS — BP 118/58 | HR 55 | Ht 60.0 in | Wt 138.0 lb

## 2014-08-20 DIAGNOSIS — I739 Peripheral vascular disease, unspecified: Secondary | ICD-10-CM | POA: Diagnosis not present

## 2014-08-20 DIAGNOSIS — F17200 Nicotine dependence, unspecified, uncomplicated: Secondary | ICD-10-CM

## 2014-08-20 DIAGNOSIS — I2510 Atherosclerotic heart disease of native coronary artery without angina pectoris: Secondary | ICD-10-CM | POA: Diagnosis not present

## 2014-08-20 DIAGNOSIS — I5022 Chronic systolic (congestive) heart failure: Secondary | ICD-10-CM

## 2014-08-20 DIAGNOSIS — E785 Hyperlipidemia, unspecified: Secondary | ICD-10-CM | POA: Diagnosis not present

## 2014-08-20 DIAGNOSIS — Z9289 Personal history of other medical treatment: Secondary | ICD-10-CM

## 2014-08-20 DIAGNOSIS — I519 Heart disease, unspecified: Secondary | ICD-10-CM

## 2014-08-20 DIAGNOSIS — Z87898 Personal history of other specified conditions: Secondary | ICD-10-CM | POA: Diagnosis not present

## 2014-08-20 DIAGNOSIS — Z720 Tobacco use: Secondary | ICD-10-CM

## 2014-08-20 MED ORDER — CARVEDILOL 3.125 MG PO TABS: 3.1250 mg | ORAL_TABLET | Freq: Two times a day (BID) | ORAL | Status: DC

## 2014-08-20 NOTE — Patient Instructions (Signed)
Your physician recommends that you schedule a follow-up appointment in: 2-3 months     Your physician has recommended you make the following change in your medication:      STOP Metoprolol  START Coreg 3.125 mg twice aday    Your physician has requested that you have a lexiscan myoview. For further information please visit [email or web address]. Please follow instruction sheet, as given.        Thank you for choosing [facility] !

## 2014-08-20 NOTE — Progress Notes (Signed)
Patient ID: Roy [name], male   DOB: 31-Oct-1946, 68 y.o.   MRN: [id number]      SUBJECTIVE: The patient is a 68 year old male who I am evaluating for the first time. He was hospitalized earlier this year for pneumonia with hypoxemia respiratory failure and found to have a non-STEMI. He has a history of tobacco abuse, COPD, alcoholism, cerebrovascular disease with intracerebral stenting, chronic systolic heart failure with an ejection fraction of 20%, chronic kidney disease and peripheral arterial disease. His NSTEMI was medically managed. Troponins peaked at 4.84. He was not a candidate for ACEI/ARB due to renal insufficiency. He denies chest pain. He has no worsening of his baseline dyspnea on exertion. He continues to smoke 1/2-2 packs of cigarettes daily. He has arthritis for which he takes ibuprofen. He has had peripheral vascular stenting by Dr. [name].   Review of Systems: As per "subjective", otherwise negative.  No Known Allergies  Current Outpatient Prescriptions  Medication Sig Dispense Refill  . aspirin EC 81 MG tablet Take 81 mg by mouth every evening.       [name] atorvastatin (LIPITOR) 20 MG tablet Take 20 mg by mouth daily.       . clopidogrel (PLAVIX) 75 MG tablet Take 75 mg by mouth every evening.       . Coenzyme Q10 (CO Q 10) 100 MG CAPS Take 100 mg by mouth every evening.       . feeding supplement, ENSURE, (ENSURE) PUDG Take 1 Container by mouth 2 (two) times daily between meals.    0  . folic acid (FOLVITE) 1 MG tablet Take 1 tablet (1 mg total) by mouth daily.      . food thickener (THICK IT) POWD Take as directed    0  . furosemide (LASIX) 20 MG tablet Take 20 mg by mouth 2 (two) times daily.      . insulin aspart (NOVOLOG) 100 UNIT/ML injection Inject 0-15 Units into the skin 3 (three) times daily with meals.  10 mL  11  . ipratropium-albuterol (DUONEB) 0.5-2.5 (3) MG/3ML SOLN Take 3 mLs by nebulization every 4 (four) hours as needed.  360 mL    . metoprolol tartrate  (LOPRESSOR) 12.5 mg TABS tablet Take 0.5 tablets (12.5 mg total) by mouth 2 (two) times daily.      . Multiple Vitamins-Minerals (CENTRUM SILVER PO) Take 0.5 tablets by mouth 2 (two) times daily.       . nicotine (NICODERM CQ - DOSED IN MG/24 HOURS) 21 mg/24hr patch Place 1 patch (21 mg total) onto the skin daily.  28 patch  0  . pantoprazole (PROTONIX) 40 MG tablet Take 40 mg by mouth 2 (two) times daily.      . potassium chloride (KLOR-CON) 20 MEQ packet Take 20 mEq by mouth daily.       . risperiDONE (RISPERDAL) 0.5 MG tablet Take 1 tablet (0.5 mg total) by mouth every morning.  30 tablet  0  . risperiDONE (RISPERDAL) 1 MG tablet Take 1 tablet (1 mg total) by mouth at bedtime.  30 tablet  0  . thiamine 100 MG tablet Take 1 tablet (100 mg total) by mouth daily.       No current facility-administered medications for this visit.    Past Medical History  Diagnosis Date  . Cerebrovascular disease 2009    TIA; 2009- right ICA stent; re-intervention for restenosis complicated by [facility] w/o sx  . Degenerative joint disease     Total shoulder arthroplasty-right  .  LBBB (left bundle branch block)     Normal echo-2011; stress nuclear in 09/2010--septal hypoperfusion representing nontransmural infarction or the effect of left bundle branch block, no ischemia  . Hyperlipidemia   . Hypertension   . Chronic obstructive pulmonary disease   . Tobacco abuse     -100 pack years; 1.5 packs per day  . Anxiety and depression   . Alcohol abuse     6 beers per day; hospital admission in 2009 for withdrawal symptoms  . Thrombocytopenia   . Tubular adenoma of colon     Past Surgical History  Procedure Laterality Date  . Lumbar fusion  2010  . Vasectomy  1971  . Colonoscopy w/ polypectomy  2011  . Total shoulder arthroplasty  2011    Right-Dr. Tamera Punt  . Colonoscopy  08/22/09    Fields-(Tubular Adenoma)3-mm transverse polyp/4-mm polyp otherwise noraml/small internal hemorrhoids  .  Esophagogastroduodenoscopy [name] 03/05/2014    Procedure: ESOPHAGOGASTRODUODENOSCOPY (EGD);  Surgeon: Danie Binder, MD;  [facility];  Service: Endoscopy;  Laterality: [name];  11:45  . Agile capsule [name] 03/18/2014    Procedure: AGILE CAPSULE;  Surgeon: Danie Binder, MD;  [facility];  Service: Endoscopy;  Laterality: [name];  7:30  . Givens capsule study [name] 03/30/2014    Procedure: GIVENS CAPSULE STUDY;  Surgeon: Danie Binder, MD;  [facility];  Service: Endoscopy;  Laterality: [name];  7:30  . Colonoscopy [name] 04/14/2014    Procedure: COLONOSCOPY;  Surgeon: Danie Binder, MD;  [facility];  Service: Endoscopy;  Laterality: [name];  9:30    History   Social History  . Marital Status: Married    Spouse Name: [name]    Number of Children: 2  . Years of Education: [name]   Occupational History  . 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     Social History Main Topics  . Smoking status: Current Every Day Smoker -- 1.00 packs/day for 60 years    Types: Cigarettes  . Smokeless tobacco: Never Used  . Alcohol Use: 36.0 oz/week    60 Cans of beer per week     Comment: 12 pack in a week (1/2 can of beer per day) x 60years  . Drug Use: No  . Sexual Activity: No   Other Topics Concern  . Not on file   Social History Narrative   Accompanied by daughter [name]   Lives w/ wife, daughter    BP 118/58 Pulse 55 Resp Temp Temp src SpO2 Weight 138 lb (62.596 kg) Height 5' (1.524 m)    PHYSICAL EXAM General: NAD HEENT: Normal. Neck: No JVD, no thyromegaly. Lungs: Clear to auscultation bilaterally with normal respiratory effort. CV: Nondisplaced PMI.  Regular rate and rhythm, normal S1/S2, no S3/S4, no murmur. No pretibial or periankle edema.  No carotid bruit.   Abdomen: Soft, nontender, no hepatosplenomegaly, no distention.  Neurologic: Alert and oriented x 3.  Psych: Normal affect. Skin: Normal. Musculoskeletal: Normal range of motion, no gross  deformities. Extremities: No clubbing or cyanosis.   ECG: Most recent ECG reviewed.      ASSESSMENT AND PLAN: 1. CAD: Stable ischemic heart disease. Given recent NSTEMI, will obtain Lexiscan to localize potential area of ischemia. Continue ASA, Plavix and Lipitor. Switch metoprolol to carvedilol. 2. Chronic systolic heart failure: Euvolemic and stable. Will switch metoprolol tartrate to carvedilol 3.125 mg bid given severe LV dysfunction. No ACEI or ARB due to CKD  for now. On Lasix. No spironolactone for now due to CKD. Informed to avoid regular use of ibuprofen. 3. Tobacco abuse: Not motivated to quit. 4. PVD: Followed by vascular surgery.  5. Hyperlipidemia: Due to have lipids checked within next few months by PCP. Continue Lipitor.  Dispo: f/u 2-3 months.  Kate Sable, M.D., F.A.C.C.

## 2014-08-24 ENCOUNTER — Telehealth: Payer: Self-pay | Admitting: Cardiovascular Disease

## 2014-08-24 ENCOUNTER — Telehealth: Payer: Self-pay | Admitting: *Deleted

## 2014-08-24 MED ORDER — RISPERIDONE 1 MG PO TABS: 1.0000 mg | ORAL_TABLET | Freq: Every day | ORAL | Status: DC

## 2014-08-24 NOTE — Telephone Encounter (Signed)
Not a cardiac medication.

## 2014-08-24 NOTE — Telephone Encounter (Signed)
Received fax refill request  Rx # [id number] Medication:  Risperidone 1 mg tablets Qty 30 Sig:  Take one tablet by mouth at bedtime Physician:  Bronson Ing

## 2014-08-30 ENCOUNTER — Encounter (HOSPITAL_COMMUNITY): Payer: Self-pay

## 2014-08-30 ENCOUNTER — Encounter (HOSPITAL_COMMUNITY)
Admission: RE | Admit: 2014-08-30 | Discharge: 2014-08-30 | Disposition: A | Discharge status: Home / Self Care | Payer: Medicare Other | Source: Ambulatory Visit | Attending: Cardiovascular Disease | Admitting: Cardiovascular Disease

## 2014-08-30 ENCOUNTER — Ambulatory Visit (HOSPITAL_COMMUNITY)
Admission: RE | Admit: 2014-08-30 | Discharge: 2014-08-30 | Disposition: A | Discharge status: Home / Self Care | Payer: Medicare Other | Source: Ambulatory Visit | Attending: Cardiovascular Disease | Admitting: Cardiovascular Disease

## 2014-08-30 DIAGNOSIS — I2510 Atherosclerotic heart disease of native coronary artery without angina pectoris: Secondary | ICD-10-CM | POA: Insufficient documentation

## 2014-08-30 DIAGNOSIS — I5022 Chronic systolic (congestive) heart failure: Secondary | ICD-10-CM | POA: Insufficient documentation

## 2014-08-30 DIAGNOSIS — R29898 Other symptoms and signs involving the musculoskeletal system: Secondary | ICD-10-CM | POA: Insufficient documentation

## 2014-08-30 DIAGNOSIS — I214 Non-ST elevation (NSTEMI) myocardial infarction: Secondary | ICD-10-CM | POA: Diagnosis not present

## 2014-08-30 MED ORDER — SODIUM CHLORIDE 0.9 % IJ SOLN
10.0000 mL | INTRAMUSCULAR | Status: DC | PRN
  Administered 2014-08-30: 10 mL via INTRAVENOUS

## 2014-08-30 MED ORDER — TECHNETIUM TC 99M SESTAMIBI GENERIC - CARDIOLITE
30.0000 | Freq: Once | INTRAVENOUS | Status: AC | PRN
  Administered 2014-08-30: 31 via INTRAVENOUS

## 2014-08-30 MED ORDER — REGADENOSON 0.4 MG/5ML IV SOLN
0.4000 mg | Freq: Once | INTRAVENOUS | Status: AC
  Administered 2014-08-30: 0.4 mg via INTRAVENOUS

## 2014-08-30 MED ORDER — SODIUM CHLORIDE 0.9 % IJ SOLN
INTRAMUSCULAR | Status: AC
  Filled 2014-08-30: qty 30

## 2014-08-30 MED ORDER — TECHNETIUM TC 99M SESTAMIBI - CARDIOLITE
10.0000 | Freq: Once | INTRAVENOUS | Status: AC | PRN
  Administered 2014-08-30: 11 via INTRAVENOUS

## 2014-08-30 MED ORDER — REGADENOSON 0.4 MG/5ML IV SOLN
INTRAVENOUS | Status: AC
  Administered 2014-08-30: 0.4 mg via INTRAVENOUS
  Filled 2014-08-30: qty 5

## 2014-08-30 MED ORDER — SODIUM CHLORIDE 0.9 % IJ SOLN
INTRAMUSCULAR | Status: AC
  Administered 2014-08-30: 10 mL via INTRAVENOUS
  Filled 2014-08-30: qty 10

## 2014-08-30 NOTE — Progress Notes (Signed)
Stress Lab Nurses Notes - [place] 08/30/2014 Reason for doing test: CAD/recent MI Type of test: Lexiscan Myoview/Cardiolite Nurse performing test: Benay Pillow RN Nuclear Medicine Tech: Melburn Hake Echo Tech: Not Applicable MD performing test: Carman Ching NP Family MD: Dr. Nevada Crane Test explained and consent signed: Yes.   IV started: Saline lock started in radiology Symptoms: SOB/ Abd burning Treatment/Intervention: None Reason test stopped: protocol completed After recovery IV was: Discontinued via X-ray tech and No redness or edema Patient to return to Nuc. Med at : 0915 Patient discharged: Home Patient's Condition upon discharge was: stable Comments: Symptoms resolved in recovery. Peak bp 119/58, HR 81, recovery bp 110/51, HR 75 Eamonn Sermeno M

## 2014-10-06 DIAGNOSIS — I10 Essential (primary) hypertension: Secondary | ICD-10-CM | POA: Diagnosis not present

## 2014-10-06 DIAGNOSIS — E785 Hyperlipidemia, unspecified: Secondary | ICD-10-CM | POA: Diagnosis not present

## 2014-10-08 DIAGNOSIS — E785 Hyperlipidemia, unspecified: Secondary | ICD-10-CM | POA: Diagnosis not present

## 2014-10-08 DIAGNOSIS — I5042 Chronic combined systolic (congestive) and diastolic (congestive) heart failure: Secondary | ICD-10-CM | POA: Diagnosis not present

## 2014-10-08 DIAGNOSIS — J449 Chronic obstructive pulmonary disease, unspecified: Secondary | ICD-10-CM | POA: Diagnosis not present

## 2014-10-14 ENCOUNTER — Encounter: Payer: Self-pay | Admitting: Gastroenterology

## 2014-10-14 ENCOUNTER — Ambulatory Visit (INDEPENDENT_AMBULATORY_CARE_PROVIDER_SITE_OTHER): Payer: Medicare Other | Admitting: Gastroenterology

## 2014-10-14 ENCOUNTER — Encounter: Payer: Self-pay | Admitting: Internal Medicine

## 2014-10-14 VITALS — BP 115/61 | HR 63 | Temp 98.4°F | Wt 149.0 lb

## 2014-10-14 DIAGNOSIS — D649 Anemia, unspecified: Secondary | ICD-10-CM | POA: Diagnosis not present

## 2014-10-14 DIAGNOSIS — K7030 Alcoholic cirrhosis of liver without ascites: Secondary | ICD-10-CM

## 2014-10-14 DIAGNOSIS — I2510 Atherosclerotic heart disease of native coronary artery without angina pectoris: Secondary | ICD-10-CM | POA: Diagnosis not present

## 2014-10-14 DIAGNOSIS — K921 Melena: Secondary | ICD-10-CM

## 2014-10-14 NOTE — Progress Notes (Signed)
cc'ed to pcp °

## 2014-10-14 NOTE — Assessment & Plan Note (Signed)
PT QUIT DRINKING ETOH. NO SIGNS OR SYMPTOMS OF ACTIVE GI BLEED.  CONTINUE TO MONITOR SYMPTOMS. OBTAIN LABS FROM DR. HALL. FOLLOW UP IN 4 MOS. REPEAT LABS/U/S AFTER NEXT VISIT.

## 2014-10-14 NOTE — Assessment & Plan Note (Addendum)
NL Hb JUN 2015-NO SIGNS OR SYMPTOMS OF ACTIVE GI BLEED. LIKELY DUE TO ACUTE/CHRONIC DISEASE.  OBTAIN LABS FROM DR. HALL PLEASE CALL WITH QUESTIONS OR CONCERNS. FOLLOW UP IN 4 MOS.

## 2014-10-14 NOTE — Progress Notes (Signed)
ON RECALL LIST  °

## 2014-10-14 NOTE — Patient Instructions (Signed)
  I  WILL OBTAIN YOUR LABS FROM DR. HALL.  PLEASE CALL IF YOU SEE BLACK TARRY STOOLS OR RECTAL BLEEDING.  FOLLOW UP IN 4 MOS. I WILL ORDER ADDITIONAL LABS AND A REPEAT ULTRASOUND AFTER YOUR NEXT VISIT.

## 2014-10-14 NOTE — Progress Notes (Signed)
   Subjective:    Patient ID: Roy [name], male    DOB: [birth year], 68 y.o.   MRN: [id number]  [name], MD  HPI LAST SEEN MAY 2015. EGD/YCS FOR MELENA/ANEMIA. NO SOURCE IDENTIFIED. CUT OUT BEER. REDUCED AMOUNT OF CIGS. HAD AMI REQUIRING INTUBATION JUL 2015-EF OCT 2015 18%. LAST CBC SEP/OCT 2015. FEELS GOOD. CHANGE IN BOWEL IN HABITS- WAS SOLID BUT PAST WEEK BMs: FREQUENT(1-2 EVERY AM: PASTY LIKE BABY POOP). THINKS IT'S DUE TO DIET. EATING A LOT OF VEGGIES.  PT DENIES FEVER, CHILLS, HEMATOCHEZIA, nausea, vomiting, melena, diarrhea, CHEST PAIN, SHORTNESS OF BREATH,  constipation, abdominal pain, problems swallowing, OR heartburn or indigestion.   Past Medical History  Diagnosis Date  . Cerebrovascular disease 2009    TIA; 2009- right ICA stent; re-intervention for restenosis complicated by [facility] w/o sx  . Degenerative joint disease     Total shoulder arthroplasty-right  . LBBB (left bundle branch block)     Normal echo-2011; stress nuclear in 09/2010--septal hypoperfusion representing nontransmural infarction or the effect of left bundle branch block, no ischemia  . Hyperlipidemia   . Hypertension   . Chronic obstructive pulmonary disease   . Tobacco abuse     -100 pack years; 1.5 packs per day  . Anxiety and depression   . Alcohol abuse     6 beers per day; hospital admission in 2009 for withdrawal symptoms  . Thrombocytopenia   . Tubular adenoma of colon     Past Surgical History  Procedure Laterality Date  . Lumbar fusion  2010  . Vasectomy  1971  . Colonoscopy w/ polypectomy  2011  . Total shoulder arthroplasty  2011    Right-Dr. Tamera Punt  . Colonoscopy  08/22/09    Cassara Nida-(Tubular Adenoma)3-mm transverse polyp/4-mm polyp otherwise noraml/small internal hemorrhoids  . Esophagogastroduodenoscopy [name] 03/05/2014    Procedure: ESOPHAGOGASTRODUODENOSCOPY (EGD);  Surgeon: Danie Binder, MD;  [facility];  Service: Endoscopy;  Laterality: [name];  11:45  . Agile  capsule [name] 03/18/2014    Procedure: AGILE CAPSULE;  Surgeon: Danie Binder, MD;  Location: [facility];  Service: Endoscopy;  Laterality: [name];  7:30  . Givens capsule study [name] 03/30/2014    Procedure: GIVENS CAPSULE STUDY;  Surgeon: Danie Binder, MD;  [facility]: [facility];  Service: Endoscopy;  Laterality: [name];  7:30  . Colonoscopy [name] 04/14/2014    Procedure: COLONOSCOPY;  Surgeon: Danie Binder, MD;  [facility]: [facility];  Service: Endoscopy;  Laterality: [name];  9:30    Review of Systems     Objective:   Physical Exam  Constitutional: He is oriented to person, place, and time. He appears well-developed and well-nourished. No distress.  HENT:  Head: Normocephalic and atraumatic.  Mouth/Throat: Oropharynx is clear and moist. No oropharyngeal exudate.  Eyes: Pupils are equal, round, and reactive to light. No scleral icterus.  Neck: Normal range of motion. Neck supple.  Cardiovascular: Normal rate, regular rhythm and normal heart sounds.   Pulmonary/Chest: Effort normal and breath sounds normal. No respiratory distress.  Abdominal: Soft. Bowel sounds are normal. He exhibits no distension. There is no tenderness.  Musculoskeletal: He exhibits no edema.  Lymphadenopathy:    He has no cervical adenopathy.  Neurological: He is alert and oriented to person, place, and time.  NO  NEW FOCAL DEFICITS   Psychiatric: He has a normal mood and affect.  Vitals reviewed.         Assessment & Plan:

## 2014-10-14 NOTE — Assessment & Plan Note (Signed)
QUIT DRINKING. WELL COMPENSATED DISEASE CLINICALLY.  CONTINUE TO MONITOR SYMPTOMS. OPV IN 4 MOS. REPEAT LABS/U/S AFTER NEXT VISIT.

## 2014-11-02 ENCOUNTER — Encounter: Payer: Self-pay | Admitting: Cardiovascular Disease

## 2014-11-02 ENCOUNTER — Ambulatory Visit: Payer: Medicare Other | Admitting: Cardiovascular Disease

## 2014-11-02 ENCOUNTER — Ambulatory Visit (INDEPENDENT_AMBULATORY_CARE_PROVIDER_SITE_OTHER): Payer: Medicare Other | Admitting: Cardiovascular Disease

## 2014-11-02 VITALS — BP 152/58 | HR 63 | Wt 149.8 lb

## 2014-11-02 DIAGNOSIS — I739 Peripheral vascular disease, unspecified: Secondary | ICD-10-CM

## 2014-11-02 DIAGNOSIS — I10 Essential (primary) hypertension: Secondary | ICD-10-CM | POA: Diagnosis not present

## 2014-11-02 DIAGNOSIS — E785 Hyperlipidemia, unspecified: Secondary | ICD-10-CM

## 2014-11-02 DIAGNOSIS — I519 Heart disease, unspecified: Secondary | ICD-10-CM

## 2014-11-02 DIAGNOSIS — I5022 Chronic systolic (congestive) heart failure: Secondary | ICD-10-CM

## 2014-11-02 DIAGNOSIS — Z716 Tobacco abuse counseling: Secondary | ICD-10-CM | POA: Diagnosis not present

## 2014-11-02 DIAGNOSIS — I2510 Atherosclerotic heart disease of native coronary artery without angina pectoris: Secondary | ICD-10-CM | POA: Diagnosis not present

## 2014-11-02 MED ORDER — AMLODIPINE BESY-BENAZEPRIL HCL 10-20 MG PO CAPS: 1.0000 | ORAL_CAPSULE | Freq: Every day | ORAL | Status: DC

## 2014-11-02 NOTE — Progress Notes (Signed)
Patient ID: Roy [name], male   DOB: August 28, 1946, 68 y.o.   MRN: [id number]      SUBJECTIVE: The patient presents for routine follow up. He was hospitalized earlier this year for pneumonia with hypoxemia respiratory failure and found to have a non-STEMI. He has a history of tobacco abuse, COPD, alcoholism, cerebrovascular disease with intracerebral stenting, chronic systolic heart failure with an ejection fraction of 20%, chronic kidney disease and peripheral arterial disease. His NSTEMI was medically managed. Troponins peaked at 4.84. He was not a candidate for ACEI/ARB due to renal insufficiency.  He denies chest pain. He has no worsening of his baseline dyspnea on exertion. He continues to smoke 1 pack of cigarettes daily and has no intention of quitting. He has had peripheral vascular stenting by Dr. Trula [name]. He has occasional vertigo but denies syncope.  Nuclear stress testing on 08/30/2014 demonstrated a large area of inferior myocardial scar with no evidence of ischemia, and inferior akinesis.   Review of Systems: As per "subjective", otherwise negative.  No Known Allergies  Current Outpatient Prescriptions  Medication Sig Dispense Refill  . amLODipine-benazepril (LOTREL) 5-10 MG per capsule Take 1 capsule by mouth daily.    [name] aspirin EC 81 MG tablet Take 81 mg by mouth every evening.     [name] atorvastatin (LIPITOR) 20 MG tablet Take 20 mg by mouth daily.     . carvedilol (COREG) 3.125 MG tablet Take 1 tablet (3.125 mg total) by mouth 2 (two) times daily. 180 tablet 3  . cetirizine (ZYRTEC) 10 MG tablet Take 10 mg by mouth daily.    . clopidogrel (PLAVIX) 75 MG tablet Take 75 mg by mouth every evening.     . Coenzyme Q10 (CO Q 10) 100 MG CAPS Take 100 mg by mouth every evening.     . Cyanocobalamin (VITAMIN B 12 PO) Take by mouth.    . cyanocobalamin 2000 MCG tablet Take 2,000 mcg by mouth daily.    . folic acid (FOLVITE) 1 MG tablet Take 1 tablet (1 mg total) by mouth daily.    .  furosemide (LASIX) 20 MG tablet Take 20 mg by mouth 2 (two) times daily.    . Iron-FA-B Cmp-C-Biot-Probiotic (FUSION PLUS) CAPS Take by mouth daily.    [name] LORazepam (ATIVAN) 1 MG tablet Take 1 mg by mouth 3 (three) times daily.    . Multiple Vitamins-Minerals (CENTRUM SILVER PO) Take 0.5 tablets by mouth 2 (two) times daily.     [name] omeprazole (PRILOSEC) 20 MG capsule Take 20 mg by mouth daily.    . pantoprazole (PROTONIX) 40 MG tablet Take 40 mg by mouth 2 (two) times daily.    [name] PARoxetine (PAXIL) 20 MG tablet Take 20 mg by mouth daily.    . potassium chloride (KLOR-CON) 20 MEQ packet Take 20 mEq by mouth daily.     . Prenatal Vit-Fe Fumarate-FA (PRENATAL MULTIVITAMIN) TABS tablet Take 1 tablet by mouth daily at 12 noon.    . pyridOXINE (VITAMIN B-6) 100 MG tablet Take 100 mg by mouth daily.    . risperiDONE (RISPERDAL) 0.5 MG tablet Take 1 tablet (0.5 mg total) by mouth every morning. 30 tablet 0  . risperiDONE (RISPERDAL) 1 MG tablet Take 1 tablet (1 mg total) by mouth at bedtime. 30 tablet 0  . thiamine 100 MG tablet Take 1 tablet (100 mg total) by mouth daily.    . vitamin C (ASCORBIC ACID) 500 MG tablet Take 500 mg by mouth daily.  No current facility-administered medications for this visit.    Past Medical History  Diagnosis Date  . Cerebrovascular disease 2009    TIA; 2009- right ICA stent; re-intervention for restenosis complicated by [facility] w/o sx  . Degenerative joint disease     Total shoulder arthroplasty-right  . LBBB (left bundle branch block)     Normal echo-2011; stress nuclear in 09/2010--septal hypoperfusion representing nontransmural infarction or the effect of left bundle branch block, no ischemia  . Hyperlipidemia   . Hypertension   . Chronic obstructive pulmonary disease   . Tobacco abuse     -100 pack years; 1.5 packs per day  . Anxiety and depression   . Alcohol abuse     6 beers per day; hospital admission in 2009 for withdrawal symptoms  . Thrombocytopenia     . Tubular adenoma of colon     Past Surgical History  Procedure Laterality Date  . Lumbar fusion  2010  . Vasectomy  1971  . Colonoscopy w/ polypectomy  2011  . Total shoulder arthroplasty  2011    Right-Dr. Tamera Punt  . Colonoscopy  08/22/09    Fields-(Tubular Adenoma)3-mm transverse polyp/4-mm polyp otherwise noraml/small internal hemorrhoids  . Esophagogastroduodenoscopy [name] 03/05/2014    Procedure: ESOPHAGOGASTRODUODENOSCOPY (EGD);  Surgeon: Danie Binder, MD;  [facility]: [facility];  Service: Endoscopy;  Laterality: [name];  11:45  . Agile capsule [name] 03/18/2014    Procedure: AGILE CAPSULE;  Surgeon: Danie Binder, MD;  [facility]: [facility];  Service: Endoscopy;  Laterality: [name];  7:30  . Givens capsule study [name] 03/30/2014    Procedure: GIVENS CAPSULE STUDY;  Surgeon: Danie Binder, MD;  [facility];  Service: Endoscopy;  Laterality: [name];  7:30  . Colonoscopy [name] 04/14/2014    Procedure: COLONOSCOPY;  Surgeon: Danie Binder, MD;  [facility];  Service: Endoscopy;  Laterality: [name];  9:30    History   Social History  . Marital Status: Married    Spouse Name: [name]    Number of Children: 2  . Years of Education: [name]   Occupational History  . 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     Social History Main Topics  . Smoking status: Current Every Day Smoker -- 2.00 packs/day for 60 years    Types: Cigarettes    Start date: 08/20/1957  . Smokeless tobacco: Never Used  . Alcohol Use: 36.0 oz/week    60 Cans of beer per week     Comment: 12 pack in a week (1/2 can of beer per day) x 60years  . Drug Use: No  . Sexual Activity: No   Other Topics Concern  . Not on file   Social History Narrative   Accompanied by daughter Roy [name]   Lives w/ wife, daughter     Danley Danker Vitals:   11/02/14 1113  BP: 152/58  Pulse: 63  Weight: 149 lb 12.8 oz (67.949 kg)    PHYSICAL EXAM General: NAD HEENT: Normal. Neck: No JVD, no thyromegaly. Lungs:  Clear to auscultation bilaterally with normal respiratory effort. CV: Nondisplaced PMI.  Regular rate and rhythm, normal S1/S2, no S3/S4, no murmur. No pretibial or periankle edema.  No carotid bruit.   Abdomen: Soft, nontender, no hepatosplenomegaly, no distention.  Neurologic: Alert and oriented x 3.  Psych: Normal affect. Skin: Normal. Musculoskeletal: Normal range of motion, no gross deformities. Extremities: No clubbing or cyanosis.   ECG: Most recent ECG reviewed.  ASSESSMENT AND PLAN: 1. CAD: Stable ischemic heart disease. Nuclear MPI study results noted above. Continue ASA, Plavix, Coreg, and Lipitor.  2. Chronic systolic heart failure: Euvolemic and stable. Continue carvedilol given severe LV dysfunction. He is now on ACEI. On Lasix. No spironolactone for now due to CKD.  3. Tobacco abuse: Smokes 1 ppd, and not motivated to quit. Cessation counseling was again provided. 4. PVD: Followed by vascular surgery.  5. Hyperlipidemia: Will obtain copy of lipids done recently by PCP. Continue Lipitor 20 mg. 6. Essential HTN: Elevated. Unable to increase Coreg dose due to resting HR of 63 bpm. Will increase amlodipine-benazepril to 10-20 mg daily.  Dispo: f/u 4 months.   Kate Sable, M.D., F.A.C.C.

## 2014-11-02 NOTE — Patient Instructions (Signed)
Your physician wants you to follow-up in: 4 months You will receive a reminder letter in the mail two months in advance. If you don't receive a letter, please call our office to schedule the follow-up appointment.     INCREASE Lotrel to 10-20 mg daily     Thank you for choosing [facility] !

## 2014-11-04 ENCOUNTER — Encounter (HOSPITAL_COMMUNITY): Payer: Self-pay | Admitting: Surgery

## 2014-11-05 ENCOUNTER — Encounter: Payer: Self-pay | Admitting: Cardiovascular Disease

## 2014-11-13 ENCOUNTER — Encounter (HOSPITAL_COMMUNITY): Payer: Self-pay | Admitting: *Deleted

## 2014-11-13 ENCOUNTER — Emergency Department (HOSPITAL_COMMUNITY): Payer: Medicare Other

## 2014-11-13 ENCOUNTER — Emergency Department (HOSPITAL_COMMUNITY)
Admission: EM | Admit: 2014-11-13 | Discharge: 2014-11-14 | Disposition: A | Discharge status: Home / Self Care | Payer: Medicare Other | Attending: Emergency Medicine | Admitting: Emergency Medicine

## 2014-11-13 DIAGNOSIS — Z862 Personal history of diseases of the blood and blood-forming organs and certain disorders involving the immune mechanism: Secondary | ICD-10-CM | POA: Diagnosis not present

## 2014-11-13 DIAGNOSIS — F329 Major depressive disorder, single episode, unspecified: Secondary | ICD-10-CM | POA: Insufficient documentation

## 2014-11-13 DIAGNOSIS — E785 Hyperlipidemia, unspecified: Secondary | ICD-10-CM | POA: Diagnosis not present

## 2014-11-13 DIAGNOSIS — R0789 Other chest pain: Secondary | ICD-10-CM | POA: Diagnosis not present

## 2014-11-13 DIAGNOSIS — Z7902 Long term (current) use of antithrombotics/antiplatelets: Secondary | ICD-10-CM | POA: Diagnosis not present

## 2014-11-13 DIAGNOSIS — I10 Essential (primary) hypertension: Secondary | ICD-10-CM | POA: Insufficient documentation

## 2014-11-13 DIAGNOSIS — R079 Chest pain, unspecified: Secondary | ICD-10-CM | POA: Diagnosis not present

## 2014-11-13 DIAGNOSIS — J449 Chronic obstructive pulmonary disease, unspecified: Secondary | ICD-10-CM | POA: Insufficient documentation

## 2014-11-13 DIAGNOSIS — F419 Anxiety disorder, unspecified: Secondary | ICD-10-CM | POA: Insufficient documentation

## 2014-11-13 DIAGNOSIS — Z7982 Long term (current) use of aspirin: Secondary | ICD-10-CM | POA: Diagnosis not present

## 2014-11-13 DIAGNOSIS — M1990 Unspecified osteoarthritis, unspecified site: Secondary | ICD-10-CM | POA: Diagnosis not present

## 2014-11-13 DIAGNOSIS — Z86010 Personal history of colonic polyps: Secondary | ICD-10-CM | POA: Diagnosis not present

## 2014-11-13 DIAGNOSIS — Z720 Tobacco use: Secondary | ICD-10-CM | POA: Diagnosis not present

## 2014-11-13 DIAGNOSIS — R0989 Other specified symptoms and signs involving the circulatory and respiratory systems: Secondary | ICD-10-CM | POA: Diagnosis not present

## 2014-11-13 DIAGNOSIS — Z79899 Other long term (current) drug therapy: Secondary | ICD-10-CM | POA: Diagnosis not present

## 2014-11-13 DIAGNOSIS — R0602 Shortness of breath: Secondary | ICD-10-CM | POA: Diagnosis not present

## 2014-11-13 DIAGNOSIS — I517 Cardiomegaly: Secondary | ICD-10-CM | POA: Diagnosis not present

## 2014-11-13 LAB — CBC WITH DIFFERENTIAL/PLATELET
Basophils Absolute: 0 10*3/uL (ref 0.0–0.1)
Basophils Relative: 0 % (ref 0–1)
EOS PCT: 5 % (ref 0–5)
Eosinophils Absolute: 0.6 10*3/uL (ref 0.0–0.7)
HEMATOCRIT: 37 % — AB (ref 39.0–52.0)
Hemoglobin: 11.8 g/dL — ABNORMAL LOW (ref 13.0–17.0)
LYMPHS PCT: 17 % (ref 12–46)
Lymphs Abs: 1.9 10*3/uL (ref 0.7–4.0)
MCH: 25.4 pg — ABNORMAL LOW (ref 26.0–34.0)
MCHC: 31.9 g/dL (ref 30.0–36.0)
MCV: 79.6 fL (ref 78.0–100.0)
MONO ABS: 1 10*3/uL (ref 0.1–1.0)
Monocytes Relative: 9 % (ref 3–12)
Neutro Abs: 7.5 10*3/uL (ref 1.7–7.7)
Neutrophils Relative %: 69 % (ref 43–77)
PLATELETS: 346 10*3/uL (ref 150–400)
RBC: 4.65 MIL/uL (ref 4.22–5.81)
RDW: 17 % — ABNORMAL HIGH (ref 11.5–15.5)
WBC: 10.9 10*3/uL — AB (ref 4.0–10.5)

## 2014-11-13 LAB — BASIC METABOLIC PANEL
Anion gap: 15 (ref 5–15)
BUN: 9 mg/dL (ref 6–23)
CALCIUM: 9.4 mg/dL (ref 8.4–10.5)
CO2: 24 meq/L (ref 19–32)
Chloride: 93 mEq/L — ABNORMAL LOW (ref 96–112)
Creatinine, Ser: 0.61 mg/dL (ref 0.50–1.35)
GFR calc Af Amer: >90 mL/min (ref >90)
GFR calc non Af Amer: >90 mL/min (ref >90)
GLUCOSE: 116 mg/dL — AB (ref 70–99)
Potassium: 4.3 mEq/L (ref 3.7–5.3)
SODIUM: 132 meq/L — AB (ref 137–147)

## 2014-11-13 LAB — D-DIMER, QUANTITATIVE: D-Dimer, Quant: 0.48 ug/mL-FEU (ref 0.00–0.48)

## 2014-11-13 LAB — PROTIME-INR
INR: 1 (ref 0.00–1.49)
PROTHROMBIN TIME: 13.3 s (ref 11.6–15.2)

## 2014-11-13 LAB — TROPONIN I: Troponin I: <0.3 ng/mL (ref <0.30)

## 2014-11-13 MED ORDER — ASPIRIN 81 MG PO CHEW
324.0000 mg | CHEWABLE_TABLET | Freq: Once | ORAL | Status: AC
  Administered 2014-11-14: 324 mg via ORAL
  Filled 2014-11-13: qty 4

## 2014-11-13 MED ORDER — NITROGLYCERIN 2 % TD OINT
1.0000 [in_us] | TOPICAL_OINTMENT | Freq: Once | TRANSDERMAL | Status: AC
  Administered 2014-11-14: 1 [in_us] via TOPICAL
  Filled 2014-11-13: qty 1

## 2014-11-13 MED ORDER — KETOROLAC TROMETHAMINE 30 MG/ML IJ SOLN
30.0000 mg | Freq: Once | INTRAMUSCULAR | Status: AC
  Administered 2014-11-14: 30 mg via INTRAVENOUS
  Filled 2014-11-13: qty 1

## 2014-11-13 NOTE — ED Provider Notes (Signed)
CSN: [id number]     Arrival date & time 11/13/14  2229 History  This chart was scribed for Janice Norrie, MD by Peyton Bottoms, ED Scribe. This patient was seen in room APA14/[facility]14 and the patient's care was started at 11:49 PM.   Chief Complaint  Patient presents with  . Chest Pain   Patient is a 68 y.o. male presenting with chest pain. The history is provided by the patient. No language interpreter was used.  Chest Pain Associated symptoms: cough   Associated symptoms: no nausea and not vomiting     HPI Comments: Roy [name] is a 68 y.o. male who presents to the Emergency Department complaining of upper right sided chest pain when laying down on his back that started about a week ago and he would only have it initially when he was laying down. He states the pain would last about 10 minutes. Today, patient had constant, stabbing pain in mid sternal area that radiated into his right side chest that lasted 30 minutes. He states that he currently does not feel the pain, as long as he is sitting up and not breathing deeply. He reports associated dry cough, sore throat which began a few days ago . He currently denies associated SOB, diaphoresis, nausea, vomiting, diarrhea. He denies feeling similar symptoms in the past. He states he had a acute MI in July without chest pain. He states his only symptom was he got pale. He states he does not recall any of those events and he was in the hospital almost a month.  He states he currently smokes 1.25 ppd. He is not currently on oxygen at home. He states he last had 81mg  of Aspirin at 8:00PM earlier this evening.  Patient states he has 3 stents, 2 in his legs, but none in his coronary artery PCP Dr [facility] Dr Jacinta Shoe  Past Medical History  Diagnosis Date  . Cerebrovascular disease 2009    TIA; 2009- right ICA stent; re-intervention for restenosis complicated by [facility] w/o sx  . Degenerative joint disease     Total shoulder arthroplasty-right   . LBBB (left bundle branch block)     Normal echo-2011; stress nuclear in 09/2010--septal hypoperfusion representing nontransmural infarction or the effect of left bundle branch block, no ischemia  . Hyperlipidemia   . Hypertension   . Chronic obstructive pulmonary disease   . Tobacco abuse     -100 pack years; 1.5 packs per day  . Anxiety and depression   . Alcohol abuse     6 beers per day; hospital admission in 2009 for withdrawal symptoms  . Thrombocytopenia   . Tubular adenoma of colon    Past Surgical History  Procedure Laterality Date  . Lumbar fusion  2010  . Vasectomy  1971  . Colonoscopy w/ polypectomy  2011  . Total shoulder arthroplasty  2011    Right-Dr. Tamera Punt  . Colonoscopy  08/22/09    Fields-(Tubular Adenoma)3-mm transverse polyp/4-mm polyp otherwise noraml/small internal hemorrhoids  . Esophagogastroduodenoscopy [name] 03/05/2014    Procedure: ESOPHAGOGASTRODUODENOSCOPY (EGD);  Surgeon: Danie Binder, MD;  Location: [facility];  Service: Endoscopy;  Laterality: [name];  11:45  . Agile capsule [name] 03/18/2014    Procedure: AGILE CAPSULE;  Surgeon: Danie Binder, MD;  Location: [facility];  Service: Endoscopy;  Laterality: [name];  7:30  . Givens capsule study [name] 03/30/2014    Procedure: GIVENS CAPSULE STUDY;  Surgeon: Danie Binder, MD;  Location: [facility];  Service: Endoscopy;  Laterality: [name];  7:30  . Colonoscopy [name] 04/14/2014    Procedure: COLONOSCOPY;  Surgeon: Danie Binder, MD;  Location: [facility];  Service: Endoscopy;  Laterality: [name];  9:30  . Abdominal aortagram [name] 05/04/2014    Procedure: ABDOMINAL Maxcine Ham;  Surgeon: [name], MD;  Location: [facility] CATH LAB;  Service: Cardiovascular;  Laterality: [name];  . Joint replacement Right    Family History  Problem Relation Age of Onset  . Prostate cancer Brother   . Coronary artery disease Mother   . Diabetes Mother   . Heart disease Mother   . Hypertension Mother   . Heart attack Mother   .  Alzheimer's disease Sister   . Alzheimer's disease Father   . Diabetes Father   . Colon cancer Neg Hx    History  Substance Use Topics  . Smoking status: Current Every Day Smoker -- 1.25 packs/day for 60 years    Types: Cigarettes    Start date: 08/20/1957  . Smokeless tobacco: Never Used  . Alcohol Use: No     Comment: quit in July 2015  used to smoke 3 1/2 ppd  Lives at home Lives with spouse   Review of Systems  Respiratory: Positive for cough.   Cardiovascular: Positive for chest pain.  Gastrointestinal: Negative for nausea, vomiting and diarrhea.  All other systems reviewed and are negative.  Allergies  Review of patient's allergies indicates no known allergies.  Home Medications   Prior to Admission medications   Medication Sig Start Date End Date Taking? Authorizing Provider  amLODipine-benazepril (LOTREL) 10-20 MG per capsule Take 1 capsule by mouth daily. 11/02/14  Yes Herminio Commons, MD  aspirin EC 81 MG tablet Take 81 mg by mouth every evening.    Yes Historical Provider, MD  atorvastatin (LIPITOR) 20 MG tablet Take 20 mg by mouth daily.    Yes Historical Provider, MD  carvedilol (COREG) 3.125 MG tablet Take 1 tablet (3.125 mg total) by mouth 2 (two) times daily. 08/20/14  Yes Herminio Commons, MD  cetirizine (ZYRTEC) 10 MG tablet Take 10 mg by mouth daily.   Yes Historical Provider, MD  clopidogrel (PLAVIX) 75 MG tablet Take 75 mg by mouth every evening.    Yes Historical Provider, MD  Coenzyme Q10 (CO Q 10) 100 MG CAPS Take 100 mg by mouth every evening.    Yes Historical Provider, MD  cyanocobalamin 2000 MCG tablet Take 2,000 mcg by mouth daily.   Yes Historical Provider, MD  folic acid (FOLVITE) 1 MG tablet Take 1 tablet (1 mg total) by mouth daily. 06/22/14  Yes Ripudeep Krystal Eaton, MD  furosemide (LASIX) 20 MG tablet Take 20 mg by mouth 2 (two) times daily.   Yes Historical Provider, MD  Iron-FA-B Cmp-C-Biot-Probiotic (FUSION PLUS) CAPS Take by mouth daily.    Yes Historical Provider, MD  LORazepam (ATIVAN) 1 MG tablet Take 1 mg by mouth 3 (three) times daily.   Yes Historical Provider, MD  Multiple Vitamins-Minerals (CENTRUM SILVER PO) Take 0.5 tablets by mouth 2 (two) times daily.    Yes Historical Provider, MD  omeprazole (PRILOSEC) 20 MG capsule Take 20 mg by mouth daily.   Yes Historical Provider, MD  pantoprazole (PROTONIX) 40 MG tablet Take 40 mg by mouth 2 (two) times daily.   Yes Historical Provider, MD  PARoxetine (PAXIL) 20 MG tablet Take 20 mg by mouth daily.   Yes Historical Provider, MD  potassium chloride (KLOR-CON)  20 MEQ packet Take 20 mEq by mouth daily.  07/01/14  Yes Historical Provider, MD  Prenatal Vit-Fe Fumarate-FA (PRENATAL MULTIVITAMIN) TABS tablet Take 1 tablet by mouth daily at 12 noon.   Yes Historical Provider, MD  pyridOXINE (VITAMIN B-6) 100 MG tablet Take 100 mg by mouth daily.   Yes Historical Provider, MD  risperiDONE (RISPERDAL) 0.5 MG tablet Take 1 tablet (0.5 mg total) by mouth every morning. 06/22/14  Yes Ripudeep K Rai, MD  risperiDONE (RISPERDAL) 1 MG tablet Take 1 tablet (1 mg total) by mouth at bedtime. 08/24/14  Yes Herminio Commons, MD  thiamine 100 MG tablet Take 1 tablet (100 mg total) by mouth daily. 06/22/14  Yes Ripudeep Krystal Eaton, MD  vitamin C (ASCORBIC ACID) 500 MG tablet Take 500 mg by mouth daily.   Yes Historical Provider, MD  naproxen (NAPROSYN) 250 MG tablet Take 2 tablets (500 mg total) by mouth 2 (two) times daily with a meal. 11/14/14   Janice Norrie, MD   Triage Vitals: BP 127/67 mmHg  Pulse 67  Temp(Src) 98.1 F (36.7 C) (Oral)  Resp 28  Ht 5\' 3"  (1.6 m)  Wt 147 lb (66.679 kg)  BMI 26.05 kg/m2  SpO2 93%  Vital signs normal    Physical Exam  Constitutional: He is oriented to person, place, and time. He appears well-developed and well-nourished.  Non-toxic appearance. He does not appear ill. No distress.  HENT:  Head: Normocephalic and atraumatic.  Right Ear: External ear normal.  Left  Ear: External ear normal.  Nose: Nose normal. No mucosal edema or rhinorrhea.  Mouth/Throat: Oropharynx is clear and moist and mucous membranes are normal. No dental abscesses or uvula swelling.  Eyes: Conjunctivae and EOM are normal. Pupils are equal, round, and reactive to light.  Neck: Normal range of motion and full passive range of motion without pain. Neck supple.  Cardiovascular: Normal rate, regular rhythm and normal heart sounds.  Exam reveals no gallop and no friction rub.   No murmur heard. Pulmonary/Chest: Effort normal and breath sounds normal. No respiratory distress. He has no wheezes. He has no rhonchi. He has no rales. He exhibits no tenderness and no crepitus.  Appears to be in pain when breathing deeply.  Abdominal: Soft. Normal appearance and bowel sounds are normal. He exhibits no distension. There is no tenderness. There is no rebound and no guarding.  Musculoskeletal: Normal range of motion. He exhibits no edema or tenderness.  Moves all extremities well.   Neurological: He is alert and oriented to person, place, and time. He has normal strength. No cranial nerve deficit.  Skin: Skin is warm, dry and intact. No rash noted. No erythema. No pallor.  Psychiatric: He has a normal mood and affect. His speech is normal and behavior is normal. His mood appears not anxious.  Nursing note and vitals reviewed.  ED Course  Procedures (including critical care time)  Medications  nitroGLYCERIN (NITROGLYN) 2 % ointment 1 inch (1 inch Topical Given 11/14/14 0006)  ketorolac (TORADOL) 30 MG/ML injection 30 mg (30 mg Intravenous Given 11/14/14 0005)  aspirin chewable tablet 324 mg (324 mg Oral Given 11/14/14 0003)    DIAGNOSTIC STUDIES: Oxygen Saturation is 93% on RA, normal by my interpretation.    COORDINATION OF CARE: 11:55 PM- Discussed plans to order diagnostic imaging or chest, EKG, and lab work. Pt advised of plan for treatment and pt agrees.   Patient states his pain  was improved after taking the above  medications.  Patient was ambulated by nursing staff and his pulse ox remained 95% on room air while ambulating. He reports he is no longer having any chest pain. At this point we discussed his chest pain is most likely musculoskeletal. He does not have pneumonia, acute MI, and his d-dimer was normal so PE not likely. His symptoms are consistent with muscular skeletal pain in that it hurts with certain positions and deep breathing. Although his x-ray showed some mild failure patient denies any shortness of breath with the pain.  Labs Review Results for orders placed or performed during the hospital encounter of 11/13/14  CBC with Differential  Result Value Ref Range   WBC 10.9 (H) 4.0 - 10.5 K/uL   RBC 4.65 4.22 - 5.81 MIL/uL   Hemoglobin 11.8 (L) 13.0 - 17.0 g/dL   HCT 37.0 (L) 39.0 - 52.0 %   MCV 79.6 78.0 - 100.0 fL   MCH 25.4 (L) 26.0 - 34.0 pg   MCHC 31.9 30.0 - 36.0 g/dL   RDW 17.0 (H) 11.5 - 15.5 %   Platelets 346 150 - 400 K/uL   Neutrophils Relative % 69 43 - 77 %   Neutro Abs 7.5 1.7 - 7.7 K/uL   Lymphocytes Relative 17 12 - 46 %   Lymphs Abs 1.9 0.7 - 4.0 K/uL   Monocytes Relative 9 3 - 12 %   Monocytes Absolute 1.0 0.1 - 1.0 K/uL   Eosinophils Relative 5 0 - 5 %   Eosinophils Absolute 0.6 0.0 - 0.7 K/uL   Basophils Relative 0 0 - 1 %   Basophils Absolute 0.0 0.0 - 0.1 K/uL  Basic metabolic panel  Result Value Ref Range   Sodium 132 (L) 137 - 147 mEq/L   Potassium 4.3 3.7 - 5.3 mEq/L   Chloride 93 (L) 96 - 112 mEq/L   CO2 24 19 - 32 mEq/L   Glucose, Bld 116 (H) 70 - 99 mg/dL   BUN 9 6 - 23 mg/dL   Creatinine, Ser 0.61 0.50 - 1.35 mg/dL   Calcium 9.4 8.4 - 10.5 mg/dL   GFR calc non Af Amer >90 >90 mL/min   GFR calc Af Amer >90 >90 mL/min   Anion gap 15 5 - 15  Protime-INR  Result Value Ref Range   Prothrombin Time 13.3 11.6 - 15.2 seconds   INR 1.00 0.00 - 1.49  D-dimer, quantitative  Result Value Ref Range   D-Dimer, Quant  0.48 0.00 - 0.48 ug/mL-FEU  Troponin I  Result Value Ref Range   Troponin I <0.30 <0.30 ng/mL  Troponin I  Result Value Ref Range   Troponin I <0.30 <0.30 ng/mL   Laboratory interpretation all normal with negative increase in the troponin    Imaging Review Dg Chest Portable 1 View  11/13/2014   CLINICAL DATA:  Chest pain for 1 week.  Shortness of breath.  EXAM: PORTABLE CHEST - 1 VIEW  COMPARISON:  06/28/2014.  FINDINGS: Mild cardiac enlargement. Mild vascular congestion. No lobar consolidation. No effusion or pneumothorax. Slight worsening aeration from priors. Previous RIGHT shoulder arthroplasty.  IMPRESSION: Mild cardiac enlargement with vascular congestion.   Electronically Signed   By: Rolla Flatten M.D.   On: 11/13/2014 23:12     EKG Interpretation   Date/Time:  Saturday November 13 2014 22:53:14 EST Ventricular Rate:  66 PR Interval:  146 QRS Duration: 166 QT Interval:  481 QTC Calculation: 504 R Axis:   50 Text Interpretation:  Sinus rhythm  Left bundle branch block Baseline  wander in lead(s) V6 No significant change since last tracing Confirmed by  WARD,  DO, KRISTEN [phone]) on 11/13/2014 10:58:42 PM       MDM   Final diagnoses:  Atypical chest pain    New Prescriptions   NAPROXEN (NAPROSYN) 250 MG TABLET    Take 2 tablets (500 mg total) by mouth 2 (two) times daily with a meal.     Plan discharge  Rolland Porter, MD, FACEP   I personally performed the services described in this documentation, which was scribed in my presence. The recorded information has been reviewed and considered.  Rolland Porter, MD, FACEP   Janice Norrie, MD 11/14/14 [phone]

## 2014-11-13 NOTE — ED Notes (Signed)
Light chest pain began earlier this week, but today it became severe.  Pain is midsternal and radiating to R chest w/each breath.  Denies SOB, dizziness, n/v.

## 2014-11-14 DIAGNOSIS — R0789 Other chest pain: Secondary | ICD-10-CM | POA: Diagnosis not present

## 2014-11-14 LAB — TROPONIN I: Troponin I: <0.3 ng/mL (ref <0.30)

## 2014-11-14 MED ORDER — NAPROXEN 250 MG PO TABS: 500.0000 mg | ORAL_TABLET | Freq: Two times a day (BID) | ORAL | Status: DC

## 2014-11-14 NOTE — Discharge Instructions (Signed)
Take the naproxen for pain if it returns. Recheck if you get a fever, short of breath, or the pain gets constant or seems worse.

## 2014-11-14 NOTE — ED Notes (Signed)
O2 was 95% while ambulating.

## 2014-11-29 ENCOUNTER — Ambulatory Visit (HOSPITAL_COMMUNITY)
Admission: RE | Admit: 2014-11-29 | Discharge: 2014-11-29 | Disposition: A | Discharge status: Home / Self Care | Payer: Medicare Other | Source: Ambulatory Visit | Attending: Family | Admitting: Family

## 2014-11-29 ENCOUNTER — Other Ambulatory Visit: Payer: Self-pay | Admitting: Surgery

## 2014-11-29 DIAGNOSIS — I10 Essential (primary) hypertension: Secondary | ICD-10-CM | POA: Insufficient documentation

## 2014-11-29 DIAGNOSIS — Z48812 Encounter for surgical aftercare following surgery on the circulatory system: Secondary | ICD-10-CM

## 2014-11-29 DIAGNOSIS — E785 Hyperlipidemia, unspecified: Secondary | ICD-10-CM | POA: Insufficient documentation

## 2014-11-29 DIAGNOSIS — I739 Peripheral vascular disease, unspecified: Secondary | ICD-10-CM

## 2014-11-30 DIAGNOSIS — N401 Enlarged prostate with lower urinary tract symptoms: Secondary | ICD-10-CM | POA: Diagnosis not present

## 2014-11-30 DIAGNOSIS — D509 Iron deficiency anemia, unspecified: Secondary | ICD-10-CM | POA: Diagnosis not present

## 2014-11-30 DIAGNOSIS — F5101 Primary insomnia: Secondary | ICD-10-CM | POA: Diagnosis not present

## 2014-12-03 ENCOUNTER — Encounter: Payer: Self-pay | Admitting: Family

## 2014-12-06 ENCOUNTER — Encounter: Payer: Self-pay | Admitting: Family

## 2014-12-06 ENCOUNTER — Ambulatory Visit (INDEPENDENT_AMBULATORY_CARE_PROVIDER_SITE_OTHER): Payer: Medicare Other | Admitting: Family

## 2014-12-06 VITALS — BP 135/76 | HR 70 | Resp 16 | Ht 63.5 in | Wt 151.0 lb

## 2014-12-06 DIAGNOSIS — Z720 Tobacco use: Secondary | ICD-10-CM | POA: Diagnosis not present

## 2014-12-06 DIAGNOSIS — Z8679 Personal history of other diseases of the circulatory system: Secondary | ICD-10-CM | POA: Diagnosis not present

## 2014-12-06 DIAGNOSIS — Z9889 Other specified postprocedural states: Secondary | ICD-10-CM

## 2014-12-06 DIAGNOSIS — Z95828 Presence of other vascular implants and grafts: Secondary | ICD-10-CM

## 2014-12-06 NOTE — Progress Notes (Signed)
[facility] [place] HISTORY AND PHYSICAL -PAD  History of Present Illness Roy [name] is a 69 y.o. male patient of Dr. [name] who returns today for followup. He is status post bilateral iliac stenting on 05/04/2014. This was done for lifestyle limiting claudication. The patient reports that he feels he is a little bit better. Today he feels that his leg pain which occurs mainly with standing is essentially unchanged. He states that activity does improve his symptoms. Daughter with him feels that his symptoms have improved and he is more active, daughter states he is able to walk all around the [employer] with no claudication symptoms, was not able to do this before the stents placement. Pt denies non healing wounds This is the first opportunity I have had to evaluate this patient.  He has had a lumbar fusion, states he has low back pain, denies radiculopathy pain. He reports that he has had a stroke, states Dr. Estanislado Pandy checks [place] of his neck; had an MI July 2015.  The patient denies New Medical or Surgical History.  Pt Diabetic: No Pt smoker: smoker  (1 ppd, started at age 86 yrs)  Pt meds include: Statin :Yes Betablocker: Yes ASA: Yes Other anticoagulants/antiplatelets: Plavix  Past Medical History  Diagnosis Date  . Cerebrovascular disease 2009    TIA; 2009- right ICA stent; re-intervention for restenosis complicated by [facility] w/o sx  . Degenerative joint disease     Total shoulder arthroplasty-right  . LBBB (left bundle branch block)     Normal echo-2011; stress nuclear in 09/2010--septal hypoperfusion representing nontransmural infarction or the effect of left bundle branch block, no ischemia  . Hyperlipidemia   . Hypertension   . Chronic obstructive pulmonary disease   . Tobacco abuse     -100 pack years; 1.5 packs per day  . Anxiety and depression   . Alcohol abuse     6 beers per day; hospital admission in 2009 for withdrawal symptoms  .  Thrombocytopenia   . Tubular adenoma of colon   . Myocardial infarction June 02, 2014    Massive Heart Attack    Social History History  Substance Use Topics  . Smoking status: Current Every Day Smoker -- 1.25 packs/day for 60 years    Types: Cigarettes    Start date: 08/20/1957  . Smokeless tobacco: Never Used  . Alcohol Use: No     Comment: quit in July 2015    Family History Family History  Problem Relation Age of Onset  . Prostate cancer Brother   . Coronary artery disease Mother   . Diabetes Mother   . Heart disease Mother   . Hypertension Mother   . Heart attack Mother   . Alzheimer's disease Sister   . Alzheimer's disease Father   . Diabetes Father   . Colon cancer Neg Hx     Past Surgical History  Procedure Laterality Date  . Lumbar fusion  2010  . Vasectomy  1971  . Colonoscopy w/ polypectomy  2011  . Total shoulder arthroplasty  2011    Right-Dr. Tamera Punt  . Colonoscopy  08/22/09    Fields-(Tubular Adenoma)3-mm transverse polyp/4-mm polyp otherwise noraml/small internal hemorrhoids  . Esophagogastroduodenoscopy [name] 03/05/2014    Procedure: ESOPHAGOGASTRODUODENOSCOPY (EGD);  Surgeon: Danie Binder, MD;  Location: [facility];  Service: Endoscopy;  Laterality: [name];  11:45  . Agile capsule [name] 03/18/2014    Procedure: AGILE CAPSULE;  Surgeon: Danie Binder, MD;  Location: [facility];  Service: Endoscopy;  Laterality: [name];  7:30  . Givens capsule study [name] 03/30/2014    Procedure: GIVENS CAPSULE STUDY;  Surgeon: Danie Binder, MD;  Location: [facility];  Service: Endoscopy;  Laterality: [name];  7:30  . Colonoscopy [name] 04/14/2014    Procedure: COLONOSCOPY;  Surgeon: Danie Binder, MD;  Location: [facility];  Service: Endoscopy;  Laterality: [name];  9:30  . Abdominal aortagram [name] 05/04/2014    Procedure: ABDOMINAL Maxcine Ham;  Surgeon: [name], MD;  Location: [facility] CATH LAB;  Service: Cardiovascular;  Laterality: [name];  . Joint replacement Right     No  Known Allergies  Current Outpatient Prescriptions  Medication Sig Dispense Refill  . amLODipine-benazepril (LOTREL) 10-20 MG per capsule Take 1 capsule by mouth daily. 90 capsule 3  . aspirin EC 81 MG tablet Take 81 mg by mouth every evening.     [name] atorvastatin (LIPITOR) 20 MG tablet Take 20 mg by mouth daily.     . carvedilol (COREG) 3.125 MG tablet Take 1 tablet (3.125 mg total) by mouth 2 (two) times daily. 180 tablet 3  . cetirizine (ZYRTEC) 10 MG tablet Take 10 mg by mouth daily.    . clopidogrel (PLAVIX) 75 MG tablet Take 75 mg by mouth every evening.     . Coenzyme Q10 (CO Q 10) 100 MG CAPS Take 100 mg by mouth every evening.     . cyanocobalamin 2000 MCG tablet Take 2,000 mcg by mouth daily.    . folic acid (FOLVITE) 1 MG tablet Take 1 tablet (1 mg total) by mouth daily.    . furosemide (LASIX) 20 MG tablet Take 20 mg by mouth 2 (two) times daily.    . Iron-FA-B Cmp-C-Biot-Probiotic (FUSION PLUS) CAPS Take by mouth daily.    [name] LORazepam (ATIVAN) 1 MG tablet Take 1 mg by mouth 3 (three) times daily.    . Multiple Vitamins-Minerals (CENTRUM SILVER PO) Take 0.5 tablets by mouth 2 (two) times daily.     . naproxen (NAPROSYN) 250 MG tablet Take 2 tablets (500 mg total) by mouth 2 (two) times daily with a meal. 14 tablet 0  . omeprazole (PRILOSEC) 20 MG capsule Take 20 mg by mouth daily.    . pantoprazole (PROTONIX) 40 MG tablet Take 40 mg by mouth 2 (two) times daily.    [name] PARoxetine (PAXIL) 20 MG tablet Take 20 mg by mouth daily.    . potassium chloride (KLOR-CON) 20 MEQ packet Take 20 mEq by mouth daily.     . Prenatal Vit-Fe Fumarate-FA (PRENATAL MULTIVITAMIN) TABS tablet Take 1 tablet by mouth daily at 12 noon.    . pyridOXINE (VITAMIN B-6) 100 MG tablet Take 100 mg by mouth daily.    . risperiDONE (RISPERDAL) 0.5 MG tablet Take 1 tablet (0.5 mg total) by mouth every morning. 30 tablet 0  . risperiDONE (RISPERDAL) 1 MG tablet Take 1 tablet (1 mg total) by mouth at bedtime. 30 tablet 0   . thiamine 100 MG tablet Take 1 tablet (100 mg total) by mouth daily.    . vitamin C (ASCORBIC ACID) 500 MG tablet Take 500 mg by mouth daily.     No current facility-administered medications for this visit.    ROS: See HPI for pertinent positives and negatives.   Physical Examination  Filed Vitals:   12/06/14 1415  BP: 135/76  Pulse: 70  Resp: 16  Height: 5' 3.5" (1.613 m)  Weight: 151 lb (68.493  kg)  SpO2: 98%   Body mass index is 26.33 kg/(m^2).  General: A&O x 3, WDWN. Gait: normal Eyes: PERRLA. Pulmonary: CTAB, without wheezes , rales or rhonchi. Cardiac: regular Rythm , without detected murmur.         Carotid Bruits Right Left   Negative Negative  Aorta is not palpable. Radial pulses: are 2+ palpable and =                           VASCULAR EXAM: Extremities without ischemic changes without Gangrene; without open wounds.                                                                                                          LE Pulses Right Left       FEMORAL  faintly palpable  1+ palpable        POPLITEAL  not palpable   not palpable       POSTERIOR TIBIAL  not palpable   not palpable        DORSALIS PEDIS      ANTERIOR TIBIAL faintly palpable  faintly palpable    Abdomen: soft, NT, no palpable masses. Skin: no rashes, no ulcers. Musculoskeletal: no muscle wasting or atrophy.  Neurologic: A&O X 3; Appropriate Affect ; SENSATION: normal; MOTOR FUNCTION:  moving all extremities equally, motor strength 5/5 throughout. Speech is fluent/normal. CN 2-12 intact.    Non-Invasive Vascular Imaging: DATE:  (11/29/14) ILIAC ARTERY STENT EVALUATION    INDICATION: Follow-up iliac artery stent    PREVIOUS INTERVENTION(S): Bilateral external iliac artery stents placed 05/04/2014    DUPLEX EXAM:     RIGHT  LEFT   Peak Systolic Velocity (cm/s) Ratio (if abnormal) Waveform  Peak Systolic Velocity (cm/s) Ratio (if abnormal) Waveform  73   Aorta - Distal 73     160  T Artery - Proximal to Stent 112  T  187  T Stent - Proximal 112  T  176  T Stent - Mid 167  T  182  T Stent - Distal 178  T  249  T Artery - Distal to Stent 182  T  .99 Today's ABI / TBI .80  .84 Previous ABI / TBI (05/31/2014 ) .72    Waveform:    M - Monophasic       B - Biphasic       T - Triphasic  If Ankle Brachial Index (ABI) or Toe Brachial Index (TBI) performed, please see complete report     ADDITIONAL FINDINGS: Technically difficult study; exam was mistakenly entered as a lower extremity duplex so the patient was not NPO and had several cups of coffee and cigarettes before arriving for his appointment.    IMPRESSION: Widely patent stents of the bilateral external iliac artery without evidence of restenosis or hyperplasia. Mild native disease is observed in the bilateral common iliac artery.     Compared to the previous exam:  No duplex since intervention. Ankle brachial index has improved.  ASSESSMENT: SAVON BORDONARO is a 69 y.o. male who is s/p bilateral external iliac artery stents placed 05/04/2014. Today's bilateral iliac artery sten Duplex reveals widely patent stents of the bilateral external iliac artery without evidence of restenosis or hyperplasia. Mild native disease is observed in the bilateral common iliac artery.  Ankle brachial index has improved since the stents were placed. His claudication symptoms have resolved s/p iliac stents placement, no tissue loss. In addition to PAD, he had a stroke and a recent MI. Unfortunately he continues to smoke and expressed no interest in quitting. Face to face time with patient was 25 minutes. Over 50% of this time was spent on counseling and coordination of care.   PLAN:   The patient was counseled re smoking cessation and given several free resources re smoking cessation. Graduated walking program. I discussed in depth with the patient the nature of atherosclerosis, and emphasized the importance of maximal  medical management including strict control of blood pressure, blood glucose, and lipid levels, obtaining regular exercise, and cessation of smoking.  The patient is aware that without maximal medical management the underlying atherosclerotic disease process will progress, limiting the benefit of any interventions.  Based on the patient's vascular studies and examination, pt will return to clinic in 6 months for ABI's and bilateral iliac artery Duplex.  The patient was given information about PAD including signs, symptoms, treatment, what symptoms should prompt the patient to seek immediate medical care, and risk reduction measures to take.  Roy Chambers, RN, MSN, FNP-C [facility] Phone: [phone]  Clinic MD: [name]  12/06/2014 2:04 PM

## 2014-12-06 NOTE — Patient Instructions (Signed)
Peripheral Vascular Disease Peripheral Vascular Disease (PVD), also called Peripheral Arterial Disease (PAD), is a circulation problem caused by cholesterol (atherosclerotic plaque) deposits in the arteries. PVD commonly occurs in the lower extremities (legs) but it can occur in other areas of the body, such as your arms. The cholesterol buildup in the arteries reduces blood flow which can cause pain and other serious problems. The presence of PVD can place a person at risk for Coronary Artery Disease (CAD).  CAUSES  Causes of PVD can be many. It is usually associated with more than one risk factor such as:   High Cholesterol.  Smoking.  Diabetes.  Lack of exercise or inactivity.  High blood pressure (hypertension).  Obesity.  Family history. SYMPTOMS   When the lower extremities are affected, patients with PVD may experience:  Leg pain with exertion or physical activity. This is called INTERMITTENT CLAUDICATION. This may present as cramping or numbness with physical activity. The location of the pain is associated with the level of blockage. For example, blockage at the abdominal level (distal abdominal aorta) may result in buttock or hip pain. Lower leg arterial blockage may result in calf pain.  As PVD becomes more severe, pain can develop with less physical activity.  In people with severe PVD, leg pain may occur at rest.  Other PVD signs and symptoms:  Leg numbness or weakness.  Coldness in the affected leg or foot, especially when compared to the other leg.  A change in leg color.  Patients with significant PVD are more prone to ulcers or sores on toes, feet or legs. These may take longer to heal or may reoccur. The ulcers or sores can become infected.  If signs and symptoms of PVD are ignored, gangrene may occur. This can result in the loss of toes or loss of an entire limb.  Not all leg pain is related to PVD. Other medical conditions can cause leg pain such  as:  Blood clots (embolism) or Deep Vein Thrombosis.  Inflammation of the blood vessels (vasculitis).  Spinal stenosis. DIAGNOSIS  Diagnosis of PVD can involve several different types of tests. These can include:  Pulse Volume Recording Method (PVR). This test is simple, painless and does not involve the use of X-rays. PVR involves measuring and comparing the blood pressure in the arms and legs. An ABI (Ankle-Brachial Index) is calculated. The normal ratio of blood pressures is 1. As this number becomes smaller, it indicates more severe disease.  < 0.95 - indicates significant narrowing in one or more leg vessels.  <0.8 - there will usually be pain in the foot, leg or buttock with exercise.  <0.4 - will usually have pain in the legs at rest.  <0.25 - usually indicates limb threatening PVD.  Doppler detection of pulses in the legs. This test is painless and checks to see if you have a pulses in your legs/feet.  A dye or contrast material (a substance that highlights the blood vessels so they show up on x-ray) may be given to help your caregiver better see the arteries for the following tests. The dye is eliminated from your body by the kidney's. Your caregiver may order blood work to check your kidney function and other laboratory values before the following tests are performed:  Magnetic Resonance Angiography (MRA). An MRA is a picture study of the blood vessels and arteries. The MRA machine uses a large magnet to produce images of the blood vessels.  Computed Tomography Angiography (CTA). A CTA   is a specialized x-ray that looks at how the blood flows in your blood vessels. An IV may be inserted into your arm so contrast dye can be injected.  Angiogram. Is a procedure that uses x-rays to look at your blood vessels. This procedure is minimally invasive, meaning a small incision (cut) is made in your groin. A small tube (catheter) is then inserted into the artery of your groin. The catheter  is guided to the blood vessel or artery your caregiver wants to examine. Contrast dye is injected into the catheter. X-rays are then taken of the blood vessel or artery. After the images are obtained, the catheter is taken out. TREATMENT  Treatment of PVD involves many interventions which may include:  Lifestyle changes:  Quitting smoking.  Exercise.  Following a low fat, low cholesterol diet.  Control of diabetes.  Foot care is very important to the PVD patient. Good foot care can help prevent infection.  Medication:  Cholesterol-lowering medicine.  Blood pressure medicine.  Anti-platelet drugs.  Certain medicines may reduce symptoms of Intermittent Claudication.  Interventional/Surgical options:  Angioplasty. An Angioplasty is a procedure that inflates a balloon in the blocked artery. This opens the blocked artery to improve blood flow.  Stent Implant. A wire mesh tube (stent) is placed in the artery. The stent expands and stays in place, allowing the artery to remain open.  Peripheral Bypass Surgery. This is a surgical procedure that reroutes the blood around a blocked artery to help improve blood flow. This type of procedure may be performed if Angioplasty or stent implants are not an option. SEEK IMMEDIATE MEDICAL CARE IF:   You develop pain or numbness in your arms or legs.  Your arm or leg turns cold, becomes blue in color.  You develop redness, warmth, swelling and pain in your arms or legs. MAKE SURE YOU:   Understand these instructions.  Will watch your condition.  Will get help right away if you are not doing well or get worse. Document Released: 12/20/2004 Document Revised: 02/04/2012 Document Reviewed: 11/16/2008 Exit[employer] Patient Information 2015 [employer]. This information is not intended to replace advice given to you by your health care provider. Make sure you discuss any questions you have with your health care provider.    Smoking  Cessation Quitting smoking is important to your health and has many advantages. However, it is not always easy to quit since nicotine is a very addictive drug. Oftentimes, people try 3 times or more before being able to quit. This document explains the best ways for you to prepare to quit smoking. Quitting takes hard work and a lot of effort, but you can do it. ADVANTAGES OF QUITTING SMOKING  You will live longer, feel better, and live better.  Your body will feel the impact of quitting smoking almost immediately.  Within 20 minutes, blood pressure decreases. Your pulse returns to its normal level.  After 8 hours, carbon monoxide levels in the blood return to normal. Your oxygen level increases.  After 24 hours, the chance of having a heart attack starts to decrease. Your breath, hair, and body stop smelling like smoke.  After 48 hours, damaged nerve endings begin to recover. Your sense of taste and smell improve.  After 72 hours, the body is virtually free of nicotine. Your bronchial tubes relax and breathing becomes easier.  After 2 to 12 weeks, lungs can hold more air. Exercise becomes easier and circulation improves.  The risk of having a heart attack, stroke,   cancer, or lung disease is greatly reduced.  After 1 year, the risk of coronary heart disease is cut in half.  After 5 years, the risk of stroke falls to the same as a nonsmoker.  After 10 years, the risk of lung cancer is cut in half and the risk of other cancers decreases significantly.  After 15 years, the risk of coronary heart disease drops, usually to the level of a nonsmoker.  If you are pregnant, quitting smoking will improve your chances of having a healthy baby.  The people you live with, especially any children, will be healthier.  You will have extra money to spend on things other than cigarettes. QUESTIONS TO THINK ABOUT BEFORE ATTEMPTING TO QUIT You may want to talk about your answers with your health care  provider.  Why do you want to quit?  If you tried to quit in the past, what helped and what did not?  What will be the most difficult situations for you after you quit? How will you plan to handle them?  Who can help you through the tough times? Your family? Friends? A health care provider?  What pleasures do you get from smoking? What ways can you still get pleasure if you quit? Here are some questions to ask your health care provider:  How can you help me to be successful at quitting?  What medicine do you think would be best for me and how should I take it?  What should I do if I need more help?  What is smoking withdrawal like? How can I get information on withdrawal? GET READY  Set a quit date.  Change your environment by getting rid of all cigarettes, ashtrays, matches, and lighters in your home, car, or work. Do not let people smoke in your home.  Review your past attempts to quit. Think about what worked and what did not. GET SUPPORT AND ENCOURAGEMENT You have a better chance of being successful if you have help. You can get support in many ways.  Tell your family, friends, and coworkers that you are going to quit and need their support. Ask them not to smoke around you.  Get individual, group, or telephone counseling and support. Programs are available at local hospitals and health centers. Call your local health department for information about programs in your area.  Spiritual beliefs and practices may help some smokers quit.  Download a "quit meter" on your computer to keep track of quit statistics, such as how long you have gone without smoking, cigarettes not smoked, and money saved.  Get a self-help book about quitting smoking and staying off tobacco. LEARN NEW SKILLS AND BEHAVIORS  Distract yourself from urges to smoke. Talk to someone, go for a walk, or occupy your time with a task.  Change your normal routine. Take a different route to work. Drink tea  instead of coffee. Eat breakfast in a different place.  Reduce your stress. Take a hot bath, exercise, or read a book.  Plan something enjoyable to do every day. Reward yourself for not smoking.  Explore interactive web-based programs that specialize in helping you quit. GET MEDICINE AND USE IT CORRECTLY Medicines can help you stop smoking and decrease the urge to smoke. Combining medicine with the above behavioral methods and support can greatly increase your chances of successfully quitting smoking.  Nicotine replacement therapy helps deliver nicotine to your body without the negative effects and risks of smoking. Nicotine replacement therapy includes nicotine gum, lozenges,   inhalers, nasal sprays, and skin patches. Some may be available over-the-counter and others require a prescription.  Antidepressant medicine helps people abstain from smoking, but how this works is unknown. This medicine is available by prescription.  Nicotinic receptor partial agonist medicine simulates the effect of nicotine in your brain. This medicine is available by prescription. Ask your health care provider for advice about which medicines to use and how to use them based on your health history. Your health care provider will tell you what side effects to look out for if you choose to be on a medicine or therapy. Carefully read the information on the package. Do not use any other product containing nicotine while using a nicotine replacement product.  RELAPSE OR DIFFICULT SITUATIONS Most relapses occur within the first 3 months after quitting. Do not be discouraged if you start smoking again. Remember, most people try several times before finally quitting. You may have symptoms of withdrawal because your body is used to nicotine. You may crave cigarettes, be irritable, feel very hungry, cough often, get headaches, or have difficulty concentrating. The withdrawal symptoms are only temporary. They are strongest when you  first quit, but they will go away within 10-14 days. To reduce the chances of relapse, try to:  Avoid drinking alcohol. Drinking lowers your chances of successfully quitting.  Reduce the amount of caffeine you consume. Once you quit smoking, the amount of caffeine in your body increases and can give you symptoms, such as a rapid heartbeat, sweating, and anxiety.  Avoid smokers because they can make you want to smoke.  Do not let weight gain distract you. Many smokers will gain weight when they quit, usually less than 10 pounds. Eat a healthy diet and stay active. You can always lose the weight gained after you quit.  Find ways to improve your mood other than smoking. FOR MORE INFORMATION  www.smokefree.gov  Document Released: 11/06/2001 Document Revised: 03/29/2014 Document Reviewed: 02/21/2012 [employer] Patient Information 2015 [employer]. This information is not intended to replace advice given to you by your health care provider. Make sure you discuss any questions you have with your health care provider.    Smoking Cessation, Tips for Success If you are ready to quit smoking, congratulations! You have chosen to help yourself be healthier. Cigarettes bring nicotine, tar, carbon monoxide, and other irritants into your body. Your lungs, heart, and blood vessels will be able to work better without these poisons. There are many different ways to quit smoking. Nicotine gum, nicotine patches, a nicotine inhaler, or nicotine nasal spray can help with physical craving. Hypnosis, support groups, and medicines help break the habit of smoking. WHAT THINGS CAN I DO TO MAKE QUITTING EASIER?  Here are some tips to help you quit for good:  Pick a date when you will quit smoking completely. Tell all of your friends and family about your plan to quit on that date.  Do not try to slowly cut down on the number of cigarettes you are smoking. Pick a quit date and quit smoking completely starting on that  day.  Throw away all cigarettes.   Clean and remove all ashtrays from your home, work, and car.  On a card, write down your reasons for quitting. Carry the card with you and read it when you get the urge to smoke.  Cleanse your body of nicotine. Drink enough water and fluids to keep your urine clear or pale yellow. Do this after quitting to flush the nicotine from   your body.  Learn to predict your moods. Do not let a bad situation be your excuse to have a cigarette. Some situations in your life might tempt you into wanting a cigarette.  Never have "just one" cigarette. It leads to wanting another and another. Remind yourself of your decision to quit.  Change habits associated with smoking. If you smoked while driving or when feeling stressed, try other activities to replace smoking. Stand up when drinking your coffee. Brush your teeth after eating. Sit in a different chair when you read the paper. Avoid alcohol while trying to quit, and try to drink fewer caffeinated beverages. Alcohol and caffeine may urge you to smoke.  Avoid foods and drinks that can trigger a desire to smoke, such as sugary or spicy foods and alcohol.  Ask people who smoke not to smoke around you.  Have something planned to do right after eating or having a cup of coffee. For example, plan to take a walk or exercise.  Try a relaxation exercise to calm you down and decrease your stress. Remember, you may be tense and nervous for the first 2 weeks after you quit, but this will pass.  Find new activities to keep your hands busy. Play with a pen, coin, or rubber band. Doodle or draw things on paper.  Brush your teeth right after eating. This will help cut down on the craving for the taste of tobacco after meals. You can also try mouthwash.   Use oral substitutes in place of cigarettes. Try using lemon drops, carrots, cinnamon sticks, or chewing gum. Keep them handy so they are available when you have the urge to  smoke.  When you have the urge to smoke, try deep breathing.  Designate your home as a nonsmoking area.  If you are a heavy smoker, ask your health care provider about a prescription for nicotine chewing gum. It can ease your withdrawal from nicotine.  Reward yourself. Set aside the cigarette money you save and buy yourself something nice.  Look for support from others. Join a support group or smoking cessation program. Ask someone at home or at work to help you with your plan to quit smoking.  Always ask yourself, "Do I need this cigarette or is this just a reflex?" Tell yourself, "Today, I choose not to smoke," or "I do not want to smoke." You are reminding yourself of your decision to quit.  Do not replace cigarette smoking with electronic cigarettes (commonly called e-cigarettes). The safety of e-cigarettes is unknown, and some may contain harmful chemicals.  If you relapse, do not give up! Plan ahead and think about what you will do the next time you get the urge to smoke. HOW WILL I FEEL WHEN I QUIT SMOKING? You may have symptoms of withdrawal because your body is used to nicotine (the addictive substance in cigarettes). You may crave cigarettes, be irritable, feel very hungry, cough often, get headaches, or have difficulty concentrating. The withdrawal symptoms are only temporary. They are strongest when you first quit but will go away within 10-14 days. When withdrawal symptoms occur, stay in control. Think about your reasons for quitting. Remind yourself that these are signs that your body is healing and getting used to being without cigarettes. Remember that withdrawal symptoms are easier to treat than the major diseases that smoking can cause.  Even after the withdrawal is over, expect periodic urges to smoke. However, these cravings are generally short lived and will go away whether you   smoke or not. Do not smoke! WHAT RESOURCES ARE AVAILABLE TO HELP ME QUIT SMOKING? Your health care  provider can direct you to community resources or hospitals for support, which may include:  Group support.  Education.  Hypnosis.  Therapy. Document Released: 08/10/2004 Document Revised: 03/29/2014 Document Reviewed: 04/30/2013 [employer] Patient Information 2015 [employer]. This information is not intended to replace advice given to you by your health care provider. Make sure you discuss any questions you have with your health care provider.  

## 2015-01-12 ENCOUNTER — Encounter: Payer: Self-pay | Admitting: Cardiovascular Disease

## 2015-01-24 ENCOUNTER — Encounter: Payer: Self-pay | Admitting: Gastroenterology

## 2015-02-14 ENCOUNTER — Encounter: Payer: Self-pay | Admitting: Gastroenterology

## 2015-02-14 ENCOUNTER — Ambulatory Visit (INDEPENDENT_AMBULATORY_CARE_PROVIDER_SITE_OTHER): Payer: Medicare Other | Admitting: Nurse Practitioner

## 2015-02-14 ENCOUNTER — Ambulatory Visit: Payer: TRICARE For Life (TFL) | Admitting: Nurse Practitioner

## 2015-02-14 ENCOUNTER — Encounter: Payer: Self-pay | Admitting: Nurse Practitioner

## 2015-02-14 VITALS — BP 129/74 | HR 62 | Temp 97.8°F | Ht 63.5 in | Wt 159.2 lb

## 2015-02-14 DIAGNOSIS — D126 Benign neoplasm of colon, unspecified: Secondary | ICD-10-CM

## 2015-02-14 DIAGNOSIS — K7030 Alcoholic cirrhosis of liver without ascites: Secondary | ICD-10-CM

## 2015-02-14 DIAGNOSIS — K921 Melena: Secondary | ICD-10-CM | POA: Diagnosis not present

## 2015-02-14 NOTE — Patient Instructions (Signed)
1. Have your labs drawn in the next day or so 2. We will schedule your abdominal ultrasound for you 3. Return for follow-up in 6 months 4. Call us if you have any questions or concerns 5. Continue your great efforts with not drinking alcohol 6. Continue trying to cut back on the cigarettes

## 2015-02-14 NOTE — Assessment & Plan Note (Signed)
69 year old man seen today for follow-up on cirrhosis, melena, and anemia. His labs show improvement in his hemoglobin which returned to baseline at approximately 11.8 g. Denies any further bleeding, no hematochezia or melena. He denies drinking any alcohol since July of last year. He is due for labs and abdominal imaging. Today we will check his labs including CBC, CMP, AFP, and PT/INR. We'll also order abdominal ultrasound of his liver to check for any lesions. Return to clinic for routine cirrhosis follow-up in 6 months or sooner if any problems.

## 2015-02-14 NOTE — Assessment & Plan Note (Signed)
69 year old male with a history of adenomatous polyps of the colon in 2010. Colonoscopy performed 04/14/2014 showed normal mucosa in the terminal ileum, left colon is redundant, no source for melena identified, small internal hemorrhoids. Recommend repeat colonoscopy in 10 years. Today denies any hematochezia, melena, unintentional weight loss, unexplained fever or chills, or any other red flag/warning symptoms. No indication for early interval colonoscopy at this point. We'll schedule him for return visit in 6 months for routine cirrhosis care including labs and abdominal imaging.

## 2015-02-14 NOTE — Progress Notes (Signed)
Referring Provider: [name], MD Primary Care Physician:  [name], MD Primary GI:  Dr. Oneida Alar  Chief Complaint  Patient presents with  . Follow-up    HPI:   69 year old male presents for followpup on cirrhosis and melena. Last seen 10/14/14 at which point he stated he had cut out beer and reduced cigarettes. AMI July 2015 with EF 18%. Was having melena and anemia and change in bowel habits. Colonoscopy done showed reduendant left colon, normal colonic and terminal ileum mucosa. Recommended follow-up in 4 months for labs and abdominal ultrasound.  Today he states he has not seen any new bleeding. Stools are dark from iron supplementation but not tarry. Has 2-3 bowel movements a day which are soft. Denies constipation, melena, hematochezia, abdominal pain, N/V. States his GERD symptoms are well controlled on his current Protonix PPI. Denies any other upper or lower GI symptoms.  Past Medical History  Diagnosis Date  . Cerebrovascular disease 2009    TIA; 2009- right ICA stent; re-intervention for restenosis complicated by [facility] w/o sx  . Degenerative joint disease     Total shoulder arthroplasty-right  . LBBB (left bundle branch block)     Normal echo-2011; stress nuclear in 09/2010--septal hypoperfusion representing nontransmural infarction or the effect of left bundle branch block, no ischemia  . Hyperlipidemia   . Hypertension   . Chronic obstructive pulmonary disease   . Tobacco abuse     -100 pack years; 1.5 packs per day  . Anxiety and depression   . Alcohol abuse     6 beers per day; hospital admission in 2009 for withdrawal symptoms  . Thrombocytopenia   . Tubular adenoma of colon   . Myocardial infarction June 02, 2014    Massive Heart Attack  . CHF (congestive heart failure) 06-02-14  . Alcoholic cirrhosis     Past Surgical History  Procedure Laterality Date  . Lumbar fusion  2010  . Vasectomy  1971  . Colonoscopy w/ polypectomy  2011  . Total shoulder  arthroplasty  2011    Right-Dr. Tamera Punt  . Colonoscopy  08/22/09    Fields-(Tubular Adenoma)3-mm transverse polyp/4-mm polyp otherwise noraml/small internal hemorrhoids  . Esophagogastroduodenoscopy [name] 03/05/2014    Procedure: ESOPHAGOGASTRODUODENOSCOPY (EGD);  Surgeon: Danie Binder, MD;  [facility];  Service: Endoscopy;  Laterality: [name];  11:45  . Agile capsule [name] 03/18/2014    Procedure: AGILE CAPSULE;  Surgeon: Danie Binder, MD;  [facility];  Service: Endoscopy;  Laterality: [name];  7:30  . Givens capsule study [name] 03/30/2014    Procedure: GIVENS CAPSULE STUDY;  Surgeon: Danie Binder, MD;  [facility];  Service: Endoscopy;  Laterality: [name];  7:30  . Colonoscopy [name] 04/14/2014    ONG:EXBMW internal hemorrhids/normal mocsa in the terminal iluem/left colonis redundant  . Abdominal aortagram [name] 05/04/2014    Procedure: ABDOMINAL Maxcine Ham;  Surgeon: [name], MD;  Location: [facility] CATH LAB;  Service: Cardiovascular;  Laterality: [name];  . Joint replacement Right   . Spine surgery      Current Outpatient Prescriptions  Medication Sig Dispense Refill  . amLODipine-benazepril (LOTREL) 10-20 MG per capsule Take 1 capsule by mouth daily. 90 capsule 3  . aspirin EC 81 MG tablet Take 81 mg by mouth every evening.     [name] atorvastatin (LIPITOR) 20 MG tablet Take 20 mg by mouth daily.     . carvedilol (COREG) 3.125 MG tablet Take 1 tablet (  3.125 mg total) by mouth 2 (two) times daily. 180 tablet 3  . cetirizine (ZYRTEC) 10 MG tablet Take 10 mg by mouth daily.    . clopidogrel (PLAVIX) 75 MG tablet Take 75 mg by mouth every evening.     . Coenzyme Q10 (CO Q 10) 100 MG CAPS Take 100 mg by mouth every evening.     . cyanocobalamin 2000 MCG tablet Take 2,000 mcg by mouth daily.    . folic acid (FOLVITE) 1 MG tablet Take 1 tablet (1 mg total) by mouth daily.    . furosemide (LASIX) 20 MG tablet Take 20 mg by mouth daily.     [name] gabapentin (NEURONTIN) 100 MG capsule  Take 100 mg by mouth 3 (three) times daily.    . Iron-FA-B Cmp-C-Biot-Probiotic (FUSION PLUS) CAPS Take by mouth daily.    [name] LORazepam (ATIVAN) 1 MG tablet Take 1 mg by mouth 3 (three) times daily.    . Multiple Vitamins-Minerals (CENTRUM SILVER PO) Take 0.5 tablets by mouth 2 (two) times daily.     . pantoprazole (PROTONIX) 40 MG tablet Take 40 mg by mouth 2 (two) times daily.    [name] PARoxetine (PAXIL) 20 MG tablet Take 20 mg by mouth daily.    . potassium chloride (KLOR-CON) 20 MEQ packet Take 20 mEq by mouth daily.     . Prenatal Vit-Fe Fumarate-FA (PRENATAL MULTIVITAMIN) TABS tablet Take 1 tablet by mouth daily at 12 noon.    . pyridOXINE (VITAMIN B-6) 100 MG tablet Take 100 mg by mouth daily.    [name] thiamine 100 MG tablet Take 1 tablet (100 mg total) by mouth daily.    . vitamin C (ASCORBIC ACID) 500 MG tablet Take 500 mg by mouth daily.     No current facility-administered medications for this visit.    Allergies as of 02/14/2015  . (No Known Allergies)    Family History  Problem Relation Age of Onset  . Prostate cancer Brother   . Coronary artery disease Mother   . Diabetes Mother   . Heart disease Mother     Before age 67 - 54 Bypasses  . Hypertension Mother   . Heart attack Mother     3-4 Heart attacks  . Alzheimer's disease Sister   . Alzheimer's disease Father   . Diabetes Father   . Colon cancer Neg Hx   . Heart attack Sister   . Cancer Brother     Prostate  . Hyperlipidemia Son     History   Social History  . Marital Status: Married    Spouse Name: [name]  . Number of Children: 2  . Years of Education: [name]   Occupational History  . 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     Social History Main Topics  . Smoking status: Current Every Day Smoker -- 1.25 packs/day for 60 years    Types: Cigarettes    Start date: 08/20/1957  . Smokeless tobacco: Never Used     Comment: 1.5 pks daily  . Alcohol Use: No     Comment: quit in July 2015; No ETOH to date 02/14/15   . Drug Use: No  . Sexual Activity: No   Other Topics Concern  . None   Social History Narrative   Accompanied by daughter [name]   Lives w/ wife, daughter    Review of Systems: Gen: Denies fever, chills, anorexia. Denies fatigue, weakness, weight loss.  CV: Denies chest pain, palpitations, syncope,  peripheral edema, and claudication. Resp: Denies dyspnea at rest, cough, wheezing, coughing up blood, and pleurisy. GI: Denies vomiting blood, jaundice, and fecal incontinence.   Denies dysphagia or odynophagia. Derm: Denies rash, itching, dry skin Psych: Denies depression, anxiety, memory loss, confusion. No homicidal or suicidal ideation.  Heme: Denies bruising, bleeding, and enlarged lymph nodes.  Physical Exam: BP 129/74 mmHg  Pulse 62  Temp(Src) 97.8 F (36.6 C) (Oral)  Ht 5' 3.5" (1.613 m)  Wt 159 lb 3.2 oz (72.213 kg)  BMI 27.76 kg/m2 General:   Alert and oriented. No distress noted. Pleasant and cooperative.  Head:  Normocephalic and atraumatic. Eyes:  Conjuctiva clear without scleral icterus. Lungs:  Clear to auscultation bilaterally. Mild expiratory wheezes noted. No rales or rhonchi. No distress.  Heart:  S1, S2 present without murmurs, rubs, or gallops. Regular rate and rhythm. Abdomen:  +BS, soft, non-tender and non-distended. No rebound or guarding. No HSM or masses noted. Msk:  Symmetrical without gross deformities. Normal posture. Extremities:  Without edema. Neurologic:  Alert and  oriented x4;  grossly normal neurologically. Skin:  Intact without significant lesions or rashes. Psych:  Alert and cooperative. Normal mood and affect.    02/14/2015 1:27 PM

## 2015-02-15 DIAGNOSIS — H2511 Age-related nuclear cataract, right eye: Secondary | ICD-10-CM | POA: Diagnosis not present

## 2015-02-15 DIAGNOSIS — H1851 Endothelial corneal dystrophy: Secondary | ICD-10-CM | POA: Diagnosis not present

## 2015-02-15 DIAGNOSIS — H2513 Age-related nuclear cataract, bilateral: Secondary | ICD-10-CM | POA: Diagnosis not present

## 2015-02-15 NOTE — Progress Notes (Signed)
cc'ed to pcp °

## 2015-02-16 ENCOUNTER — Ambulatory Visit (HOSPITAL_COMMUNITY): Admission: RE | Admit: 2015-02-16 | Payer: Medicare Other | Source: Ambulatory Visit

## 2015-02-16 LAB — CBC WITH DIFFERENTIAL/PLATELET
Basophils Absolute: 0.1 10*3/uL (ref 0.0–0.1)
Basophils Relative: 1 % (ref 0–1)
EOS ABS: 0.5 10*3/uL (ref 0.0–0.7)
Eosinophils Relative: 7 % — ABNORMAL HIGH (ref 0–5)
HCT: 43.7 % (ref 39.0–52.0)
Hemoglobin: 14.8 g/dL (ref 13.0–17.0)
LYMPHS ABS: 2.2 10*3/uL (ref 0.7–4.0)
Lymphocytes Relative: 28 % (ref 12–46)
MCH: 28.5 pg (ref 26.0–34.0)
MCHC: 33.9 g/dL (ref 30.0–36.0)
MCV: 84 fL (ref 78.0–100.0)
MPV: 10.9 fL (ref 8.6–12.4)
Monocytes Absolute: 0.6 10*3/uL (ref 0.1–1.0)
Monocytes Relative: 8 % (ref 3–12)
Neutro Abs: 4.4 10*3/uL (ref 1.7–7.7)
Neutrophils Relative %: 56 % (ref 43–77)
PLATELETS: 302 10*3/uL (ref 150–400)
RBC: 5.2 MIL/uL (ref 4.22–5.81)
RDW: 17.1 % — AB (ref 11.5–15.5)
WBC: 7.8 10*3/uL (ref 4.0–10.5)

## 2015-02-16 LAB — COMPREHENSIVE METABOLIC PANEL
ALT: 12 U/L (ref 0–53)
AST: 17 U/L (ref 0–37)
Albumin: 4.3 g/dL (ref 3.5–5.2)
Alkaline Phosphatase: 108 U/L (ref 39–117)
BILIRUBIN TOTAL: 0.4 mg/dL (ref 0.2–1.2)
BUN: 11 mg/dL (ref 6–23)
CO2: 27 meq/L (ref 19–32)
Calcium: 9.5 mg/dL (ref 8.4–10.5)
Chloride: 95 mEq/L — ABNORMAL LOW (ref 96–112)
Creat: 0.64 mg/dL (ref 0.50–1.35)
Glucose, Bld: 92 mg/dL (ref 70–99)
Potassium: 4.9 mEq/L (ref 3.5–5.3)
Sodium: 131 mEq/L — ABNORMAL LOW (ref 135–145)
Total Protein: 6.8 g/dL (ref 6.0–8.3)

## 2015-02-16 LAB — PROTIME-INR
INR: 1.04 (ref <1.50)
Prothrombin Time: 13.6 seconds (ref 11.6–15.2)

## 2015-02-17 LAB — AFP TUMOR MARKER: AFP-Tumor Marker: 1.7 ng/mL (ref <6.1)

## 2015-02-28 NOTE — Patient Instructions (Signed)
Your procedure is scheduled on: 03/08/2015  Report to [facility] at  1000  AM.  Call this number if you have problems the morning of surgery: [phone]   Do not eat food or drink liquids :After Midnight.      Take these medicines the morning of surgery with A SIP OF WATER: amlodipine, coreg, zyrtec, neurontin, ativan, protonix, paxil   Do not wear jewelry, make-up or nail polish.  Do not wear lotions, powders, or perfumes.   Do not shave 48 hours prior to surgery.  Do not bring valuables to the hospital.  Contacts, dentures or bridgework may not be worn into surgery.  Leave suitcase in the car. After surgery it may be brought to your room.  For patients admitted to the hospital, checkout time is 11:00 AM the day of discharge.   Patients discharged the day of surgery will not be allowed to drive home.  :     Please read over the following fact sheets that you were given: Coughing and Deep Breathing, Surgical Site Infection Prevention, Anesthesia Post-op Instructions and Care and Recovery After Surgery    Cataract A cataract is a clouding of the lens of the eye. When a lens becomes cloudy, vision is reduced based on the degree and nature of the clouding. Many cataracts reduce vision to some degree. Some cataracts make people more near-sighted as they develop. Other cataracts increase glare. Cataracts that are ignored and become worse can sometimes look white. The white color can be seen through the pupil. CAUSES   Aging. However, cataracts may occur at any age, even in newborns.   Certain drugs.   Trauma to the eye.   Certain diseases such as diabetes.   Specific eye diseases such as chronic inflammation inside the eye or a sudden attack of a rare form of glaucoma.   Inherited or acquired medical problems.  SYMPTOMS   Gradual, progressive drop in vision in the affected eye.   Severe, rapid visual loss. This most often happens when trauma is the cause.  DIAGNOSIS  To detect a  cataract, an eye doctor examines the lens. Cataracts are best diagnosed with an exam of the eyes with the pupils enlarged (dilated) by drops.  TREATMENT  For an early cataract, vision may improve by using different eyeglasses or stronger lighting. If that does not help your vision, surgery is the only effective treatment. A cataract needs to be surgically removed when vision loss interferes with your everyday activities, such as driving, reading, or watching TV. A cataract may also have to be removed if it prevents examination or treatment of another eye problem. Surgery removes the cloudy lens and usually replaces it with a substitute lens (intraocular lens, IOL).  At a time when both you and your doctor agree, the cataract will be surgically removed. If you have cataracts in both eyes, only one is usually removed at a time. This allows the operated eye to heal and be out of danger from any possible problems after surgery (such as infection or poor wound healing). In rare cases, a cataract may be doing damage to your eye. In these cases, your caregiver may advise surgical removal right away. The vast majority of people who have cataract surgery have better vision afterward. HOME CARE INSTRUCTIONS  If you are not planning surgery, you may be asked to do the following:  Use different eyeglasses.   Use stronger or brighter lighting.   Ask your eye doctor about reducing your  medicine dose or changing medicines if it is thought that a medicine caused your cataract. Changing medicines does not make the cataract go away on its own.   Become familiar with your surroundings. Poor vision can lead to injury. Avoid bumping into things on the affected side. You are at a higher risk for tripping or falling.   Exercise extreme care when driving or operating machinery.   Wear sunglasses if you are sensitive to bright light or experiencing problems with glare.  SEEK IMMEDIATE MEDICAL CARE IF:   You have a  worsening or sudden vision loss.   You notice redness, swelling, or increasing pain in the eye.   You have a fever.  Document Released: 11/12/2005 Document Revised: 11/01/2011 Document Reviewed: 07/06/2011 [facility] Patient Information 2012 [place].PATIENT INSTRUCTIONS POST-ANESTHESIA  IMMEDIATELY FOLLOWING SURGERY:  Do not drive or operate machinery for the first twenty four hours after surgery.  Do not make any important decisions for twenty four hours after surgery or while taking narcotic pain medications or sedatives.  If you develop intractable nausea and vomiting or a severe headache please notify your doctor immediately.  FOLLOW-UP:  Please make an appointment with your surgeon as instructed. You do not need to follow up with anesthesia unless specifically instructed to do so.  WOUND CARE INSTRUCTIONS (if applicable):  Keep a dry clean dressing on the anesthesia/puncture wound site if there is drainage.  Once the wound has quit draining you may leave it open to air.  Generally you should leave the bandage intact for twenty four hours unless there is drainage.  If the epidural site drains for more than 36-48 hours please call the anesthesia department.  QUESTIONS?:  Please feel free to call your physician or the hospital operator if you have any questions, and they will be happy to assist you.

## 2015-03-01 ENCOUNTER — Ambulatory Visit (INDEPENDENT_AMBULATORY_CARE_PROVIDER_SITE_OTHER): Payer: Medicare Other | Admitting: Cardiovascular Disease

## 2015-03-01 ENCOUNTER — Encounter: Payer: Self-pay | Admitting: Cardiovascular Disease

## 2015-03-01 VITALS — BP 128/78 | HR 58 | Ht 63.0 in | Wt 159.2 lb

## 2015-03-01 DIAGNOSIS — I739 Peripheral vascular disease, unspecified: Secondary | ICD-10-CM | POA: Diagnosis not present

## 2015-03-01 DIAGNOSIS — Z716 Tobacco abuse counseling: Secondary | ICD-10-CM

## 2015-03-01 DIAGNOSIS — I519 Heart disease, unspecified: Secondary | ICD-10-CM

## 2015-03-01 DIAGNOSIS — I2510 Atherosclerotic heart disease of native coronary artery without angina pectoris: Secondary | ICD-10-CM

## 2015-03-01 DIAGNOSIS — I10 Essential (primary) hypertension: Secondary | ICD-10-CM

## 2015-03-01 DIAGNOSIS — I5022 Chronic systolic (congestive) heart failure: Secondary | ICD-10-CM | POA: Diagnosis not present

## 2015-03-01 DIAGNOSIS — E785 Hyperlipidemia, unspecified: Secondary | ICD-10-CM | POA: Diagnosis not present

## 2015-03-01 MED ORDER — SACUBITRIL-VALSARTAN 49-51 MG PO TABS: 1.0000 | ORAL_TABLET | Freq: Two times a day (BID) | ORAL | Status: DC

## 2015-03-01 MED ORDER — AMLODIPINE BESY-BENAZEPRIL HCL 10-20 MG PO CAPS: 1.0000 | ORAL_CAPSULE | Freq: Every day | ORAL | Status: DC

## 2015-03-01 NOTE — Progress Notes (Signed)
Patient ID: Roy [name], male   DOB: 12/21/1945, 69 y.o.   MRN: [id number]      SUBJECTIVE: The patient presents for routine follow up. He was hospitalized in 2015 for pneumonia with hypoxemic respiratory failure and found to have a non-STEMI. He has a history of tobacco and alcohol abuse, COPD, cirrhosis, cerebrovascular disease with intracerebral stenting, chronic systolic heart failure with an ejection fraction of 20%, chronic kidney disease and peripheral arterial disease. His NSTEMI was medically managed. Troponins peaked at 4.84.   He denies chest pain, shortness of breath, syncope, and leg swelling. When he lies down at night on his right side after drinking a diet Pepsi and urinating, he feels a brief sensation of dizziness which spontaneously resolves.  He continues to smoke 1 to 1.5 packs of cigarettes daily and has no intention of quitting. He has had peripheral vascular stenting (bilateral iliac stenting in 04/2014) by Dr. Trula [name].  Nuclear stress testing on 08/30/2014 demonstrated a large area of inferior myocardial scar with no evidence of ischemia, and inferior akinesis.   Review of Systems: As per "subjective", otherwise negative.  No Known Allergies  Current Outpatient Prescriptions  Medication Sig Dispense Refill  . amLODipine-benazepril (LOTREL) 10-20 MG per capsule Take 1 capsule by mouth daily. 90 capsule 3  . aspirin EC 81 MG tablet Take 81 mg by mouth every evening.     [name] atorvastatin (LIPITOR) 20 MG tablet Take 20 mg by mouth daily.     . carvedilol (COREG) 3.125 MG tablet Take 1 tablet (3.125 mg total) by mouth 2 (two) times daily. 180 tablet 3  . cetirizine (ZYRTEC) 10 MG tablet Take 10 mg by mouth daily.    . clopidogrel (PLAVIX) 75 MG tablet Take 75 mg by mouth every evening.     . Coenzyme Q10 (CO Q 10) 100 MG CAPS Take 100 mg by mouth every evening.     . cyanocobalamin 2000 MCG tablet Take 2,000 mcg by mouth daily.    . folic acid (FOLVITE) 1 MG tablet  Take 1 tablet (1 mg total) by mouth daily.    . furosemide (LASIX) 20 MG tablet Take 20 mg by mouth daily.     [name] gabapentin (NEURONTIN) 100 MG capsule Take 100 mg by mouth 3 (three) times daily.    . Iron-FA-B Cmp-C-Biot-Probiotic (FUSION PLUS) CAPS Take by mouth daily.    [name] LORazepam (ATIVAN) 1 MG tablet Take 1 mg by mouth 3 (three) times daily.    . Multiple Vitamins-Minerals (CENTRUM SILVER PO) Take 0.5 tablets by mouth 2 (two) times daily.     . pantoprazole (PROTONIX) 40 MG tablet Take 40 mg by mouth 2 (two) times daily.    [name] PARoxetine (PAXIL) 20 MG tablet Take 20 mg by mouth daily.    . potassium chloride (KLOR-CON) 20 MEQ packet Take 20 mEq by mouth daily.     . Prenatal Vit-Fe Fumarate-FA (PRENATAL MULTIVITAMIN) TABS tablet Take 1 tablet by mouth daily at 12 noon.    . pyridOXINE (VITAMIN B-6) 100 MG tablet Take 100 mg by mouth daily.    [name] thiamine 100 MG tablet Take 1 tablet (100 mg total) by mouth daily.    . vitamin C (ASCORBIC ACID) 500 MG tablet Take 500 mg by mouth daily.     No current facility-administered medications for this visit.    Past Medical History  Diagnosis Date  . Cerebrovascular disease 2009    TIA; 2009- right ICA stent; re-intervention for  restenosis complicated by [facility] w/o sx  . Degenerative joint disease     Total shoulder arthroplasty-right  . LBBB (left bundle branch block)     Normal echo-2011; stress nuclear in 09/2010--septal hypoperfusion representing nontransmural infarction or the effect of left bundle branch block, no ischemia  . Hyperlipidemia   . Hypertension   . Chronic obstructive pulmonary disease   . Tobacco abuse     -100 pack years; 1.5 packs per day  . Anxiety and depression   . Alcohol abuse     6 beers per day; hospital admission in 2009 for withdrawal symptoms  . Thrombocytopenia   . Tubular adenoma of colon   . Myocardial infarction June 02, 2014    Massive Heart Attack  . CHF (congestive heart failure) 06-02-14  . Alcoholic  cirrhosis     Past Surgical History  Procedure Laterality Date  . Lumbar fusion  2010  . Vasectomy  1971  . Colonoscopy w/ polypectomy  2011  . Total shoulder arthroplasty  2011    Right-Dr. Tamera [name]  . Colonoscopy  08/22/09    Fields-(Tubular Adenoma)3-mm transverse polyp/4-mm polyp otherwise noraml/small internal hemorrhoids  . Esophagogastroduodenoscopy [name] 03/05/2014    Procedure: ESOPHAGOGASTRODUODENOSCOPY (EGD);  Surgeon: Roy Binder, MD;  [facility];  Service: Endoscopy;  Laterality: [name];  11:45  . Agile capsule [name] 03/18/2014    Procedure: AGILE CAPSULE;  Surgeon: Roy Binder, MD;  [facility];  Service: Endoscopy;  Laterality: [name];  7:30  . Givens capsule study [name] 03/30/2014    Procedure: GIVENS CAPSULE STUDY;  Surgeon: Roy Binder, MD;  [facility];  Service: Endoscopy;  Laterality: [name];  7:30  . Colonoscopy [name] 04/14/2014    XKG:YJEHU internal hemorrhids/normal mocsa in the terminal iluem/left colonis redundant  . Abdominal aortagram [name] 05/04/2014    Procedure: ABDOMINAL Maxcine Ham;  Surgeon: Roy Mitchell, MD;  Location: [facility] CATH LAB;  Service: Cardiovascular;  Laterality: [name];  . Joint replacement Right   . Spine surgery      History   Social History  . Marital Status: Married    Spouse Name: [name]  . Number of Children: 2  . Years of Education: [name]   Occupational History  . 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     Social History Main Topics  . Smoking status: Current Every Day Smoker -- 1.25 packs/day for 60 years    Types: Cigarettes    Start date: 08/20/1957  . Smokeless tobacco: Never Used     Comment: 1.5 pks daily  . Alcohol Use: No     Comment: quit in July 2015; No ETOH to date 02/14/15  . Drug Use: No  . Sexual Activity: No   Other Topics Concern  . Not on file   Social History Narrative   Accompanied by daughter Roy [name]   Lives w/ wife, daughter     Danley Danker Vitals:   03/01/15 0915  BP:  128/78  Pulse: 58  Height: 5\' 3"  (1.6 m)  Weight: 159 lb 3.2 oz (72.213 kg)  SpO2: 98%    PHYSICAL EXAM General: NAD HEENT: Normal. Neck: No JVD, no thyromegaly. Lungs: Clear to auscultation bilaterally with normal respiratory effort. CV: Nondisplaced PMI.  Regular rate and rhythm, normal S1/S2, no S3/S4, no murmur. No pretibial or periankle edema.  No carotid bruit.  Abdomen: Soft, nontender, no distention.  Neurologic: Alert and oriented x 3.  Psych: Normal affect. Skin: Normal.  Musculoskeletal: Normal range of motion, no gross deformities. Extremities: No clubbing or cyanosis.   ECG: Most recent ECG reviewed.      ASSESSMENT AND PLAN: 1. CAD: Stable ischemic heart disease. Nuclear MPI study results noted above. Continue ASA, Plavix, Coreg, and Lipitor.   2. Chronic systolic heart failure: Euvolemic and stable. Continue carvedilol given severe LV dysfunction. He is on ACEI and Lasix. CMET on 02/14/15 showed BUN 11, SCr 0.64. Will d/c Lotrel and have him start Entresto 49/51 mg bid on 03/03/15.   3. Tobacco abuse: Smokes 1-1.5 ppd, and not motivated to quit. Cessation counseling was again provided.  4. PVD: Followed by vascular surgery. Duplex on 12/06/14 demonstrated iliac stent patency.Continue ASA and statin.  5. Hyperlipidemia: Continue Lipitor 20 mg. AST 17, ALT 12 on 02/14/15. Will obtain copy of lipids from PCP.  6. Essential HTN: Well controlled on current therapy. Will need to monitor given discontinuation of Lotrel and institution of Entresto.  Dispo: f/u 3 months.   Kate Sable, M.D., F.A.C.C.

## 2015-03-01 NOTE — Patient Instructions (Signed)
Your physician recommends that you schedule a follow-up appointment in: 3 months with Dr.Koneswaran    HOLD Lotrel  START Entresto 49/51 mg twice a day START ONE DAY AFTER HOLDING LOTREL   I have given you a 1 month free trail offer plus a form to the patient support Program   Thank you for choosing [facility] !

## 2015-03-02 ENCOUNTER — Encounter (HOSPITAL_COMMUNITY): Payer: Self-pay

## 2015-03-02 ENCOUNTER — Encounter (HOSPITAL_COMMUNITY)
Admission: RE | Admit: 2015-03-02 | Discharge: 2015-03-02 | Disposition: A | Discharge status: Home / Self Care | Payer: Medicare Other | Source: Ambulatory Visit | Attending: Ophthalmology | Admitting: Ophthalmology

## 2015-03-02 ENCOUNTER — Ambulatory Visit: Payer: Medicare Other | Admitting: Cardiovascular Disease

## 2015-03-07 MED ORDER — PHENYLEPHRINE HCL 2.5 % OP SOLN
OPHTHALMIC | Status: AC
  Filled 2015-03-07: qty 15

## 2015-03-07 MED ORDER — CYCLOPENTOLATE-PHENYLEPHRINE OP SOLN OPTIME - NO CHARGE
OPHTHALMIC | Status: AC
  Filled 2015-03-07: qty 2

## 2015-03-07 MED ORDER — TETRACAINE HCL 0.5 % OP SOLN
OPHTHALMIC | Status: AC
  Filled 2015-03-07: qty 2

## 2015-03-07 MED ORDER — KETOROLAC TROMETHAMINE 0.5 % OP SOLN
OPHTHALMIC | Status: AC
  Filled 2015-03-07: qty 5

## 2015-03-08 ENCOUNTER — Ambulatory Visit (HOSPITAL_COMMUNITY): Payer: Medicare Other | Admitting: Anesthesiology

## 2015-03-08 ENCOUNTER — Encounter (HOSPITAL_COMMUNITY)
Admission: RE | Disposition: A | Discharge status: Home / Self Care | Payer: Self-pay | Source: Ambulatory Visit | Attending: Ophthalmology

## 2015-03-08 ENCOUNTER — Ambulatory Visit (HOSPITAL_COMMUNITY)
Admission: RE | Admit: 2015-03-08 | Discharge: 2015-03-08 | Disposition: A | Discharge status: Home / Self Care | Payer: Medicare Other | Source: Ambulatory Visit | Attending: Ophthalmology | Admitting: Ophthalmology

## 2015-03-08 ENCOUNTER — Encounter (HOSPITAL_COMMUNITY): Payer: Self-pay | Admitting: *Deleted

## 2015-03-08 DIAGNOSIS — H2511 Age-related nuclear cataract, right eye: Secondary | ICD-10-CM | POA: Insufficient documentation

## 2015-03-08 HISTORY — PX: CATARACT EXTRACTION W/PHACO: SHX586

## 2015-03-08 SURGERY — PHACOEMULSIFICATION, CATARACT, WITH IOL INSERTION
Anesthesia: Monitor Anesthesia Care | Site: Eye | Laterality: Right

## 2015-03-08 MED ORDER — TETRACAINE HCL 0.5 % OP SOLN
1.0000 [drp] | OPHTHALMIC | Status: AC
  Administered 2015-03-08 (×3): 1 [drp] via OPHTHALMIC

## 2015-03-08 MED ORDER — MIDAZOLAM HCL 2 MG/2ML IJ SOLN
1.0000 mg | INTRAMUSCULAR | Status: DC | PRN
  Administered 2015-03-08: 2 mg via INTRAVENOUS

## 2015-03-08 MED ORDER — FENTANYL CITRATE 0.05 MG/ML IJ SOLN
INTRAMUSCULAR | Status: AC
  Filled 2015-03-08: qty 2

## 2015-03-08 MED ORDER — TETRACAINE 0.5 % OP SOLN OPTIME - NO CHARGE
OPHTHALMIC | Status: DC | PRN
  Administered 2015-03-08: 1 [drp] via OPHTHALMIC

## 2015-03-08 MED ORDER — MIDAZOLAM HCL 2 MG/2ML IJ SOLN
INTRAMUSCULAR | Status: AC
  Filled 2015-03-08: qty 2

## 2015-03-08 MED ORDER — CYCLOPENTOLATE-PHENYLEPHRINE 0.2-1 % OP SOLN
1.0000 [drp] | OPHTHALMIC | Status: AC
  Administered 2015-03-08 (×3): 1 [drp] via OPHTHALMIC

## 2015-03-08 MED ORDER — BSS IO SOLN
INTRAOCULAR | Status: DC | PRN
  Administered 2015-03-08: 15 mL

## 2015-03-08 MED ORDER — EPINEPHRINE HCL 1 MG/ML IJ SOLN
INTRAMUSCULAR | Status: DC | PRN
  Administered 2015-03-08: 500 mL

## 2015-03-08 MED ORDER — PHENYLEPHRINE HCL 2.5 % OP SOLN
1.0000 [drp] | OPHTHALMIC | Status: AC | PRN
  Administered 2015-03-08 (×3): 1 [drp] via OPHTHALMIC

## 2015-03-08 MED ORDER — EPINEPHRINE HCL 1 MG/ML IJ SOLN
INTRAMUSCULAR | Status: AC
  Filled 2015-03-08: qty 1

## 2015-03-08 MED ORDER — LACTATED RINGERS IV SOLN
INTRAVENOUS | Status: DC
  Administered 2015-03-08: 09:00:00 via INTRAVENOUS

## 2015-03-08 MED ORDER — KETOROLAC TROMETHAMINE 0.5 % OP SOLN
1.0000 [drp] | OPHTHALMIC | Status: AC
  Administered 2015-03-08 (×3): 1 [drp] via OPHTHALMIC

## 2015-03-08 MED ORDER — FENTANYL CITRATE 0.05 MG/ML IJ SOLN
25.0000 ug | INTRAMUSCULAR | Status: AC
  Administered 2015-03-08 (×2): 25 ug via INTRAVENOUS

## 2015-03-08 MED ORDER — PROVISC 10 MG/ML IO SOLN
INTRAOCULAR | Status: DC | PRN
  Administered 2015-03-08: 0.85 mL via INTRAOCULAR

## 2015-03-08 SURGICAL SUPPLY — 9 items
CLOTH BEACON ORANGE TIMEOUT ST (SAFETY) ×2 IMPLANT
EYE SHIELD UNIVERSAL CLEAR (GAUZE/BANDAGES/DRESSINGS) ×2 IMPLANT
GLOVE BIO SURGEON STRL SZ 6.5 (GLOVE) ×1 IMPLANT
GLOVE BIO SURGEONS STRL SZ 6.5 (GLOVE) ×1
GLOVE EXAM NITRILE MD LF STRL (GLOVE) ×2 IMPLANT
LENS ALC ACRYL/TECN (Ophthalmic Related) ×3 IMPLANT
PAD ARMBOARD 7.5X6 YLW CONV (MISCELLANEOUS) ×2 IMPLANT
TAPE TRANSPORE STRL 2 31045 (GAUZE/BANDAGES/DRESSINGS) ×2 IMPLANT
WATER STERILE IRR 250ML POUR (IV SOLUTION) ×2 IMPLANT

## 2015-03-08 NOTE — Anesthesia Preprocedure Evaluation (Signed)
Anesthesia Evaluation  Patient identified by MRN, date of birth, ID band Patient awake    Reviewed: Allergy & Precautions, NPO status , Patient's Chart, lab work & pertinent test results  Airway Mallampati: I  TM Distance: >3 FB     Dental  (+) Edentulous Upper, Edentulous Lower   Pulmonary COPDCurrent Smoker,  breath sounds clear to auscultation        Cardiovascular hypertension, Pt. on medications + Past MI, + Peripheral Vascular Disease and +CHF Rhythm:Regular     Neuro/Psych PSYCHIATRIC DISORDERS Anxiety Depression    GI/Hepatic (+)     substance abuse  alcohol use,   Endo/Other    Renal/GU      Musculoskeletal   Abdominal   Peds  Hematology  (+) anemia ,   Anesthesia Other Findings   Reproductive/Obstetrics                             Anesthesia Physical Anesthesia Plan  ASA: IV  Anesthesia Plan: MAC   Post-op Pain Management:    Induction: Intravenous  Airway Management Planned: Nasal Cannula  Additional Equipment:   Intra-op Plan:   Post-operative Plan:   Informed Consent: I have reviewed the patients History and Physical, chart, labs and discussed the procedure including the risks, benefits and alternatives for the proposed anesthesia with the patient or authorized representative who has indicated his/her understanding and acceptance.     Plan Discussed with:   Anesthesia Plan Comments:         Anesthesia Quick Evaluation

## 2015-03-08 NOTE — Discharge Instructions (Signed)
Roy [name]  03/08/2015           [facility] Instructions [place] [address]      1. Avoid closing eyes tightly. One often closes the eye tightly when laughing, talking, sneezing, coughing or if they feel irritated. At these times, you should be careful not to close your eyes tightly.  2. Instill eye drops as instructed. To instill drops in your eye, open it, look up and have someone gently pull the lower lid down and instill a couple of drops inside the lower lid.  3. Do not touch upper lid.  4. Take Advil or Tylenol for pain.  5. You may use either eye for near work, such as reading or sewing and you may watch television.  6. You may have your hair done at the beauty parlor at any time.  7. Wear dark glasses with or without your own glasses if you are in bright light.  8. Call our office at [phone] or [phone] if you have sharp pain in your eye or unusual symptoms.  9. Do not be concerned because vision in the operative eye is not good. It will not be good, no matter how successful the operation, until you get a special lens for it. Your old glasses will not be suited to the new eye that was operated on and you will not be ready for a new lens for about a month.  10. Follow up at the [facility] office.    I have received a copy of the above instructions and will follow them.

## 2015-03-08 NOTE — H&P (Signed)
The patient was re examined and there is no change in the patients condition since the original H and P. 

## 2015-03-08 NOTE — Anesthesia Postprocedure Evaluation (Signed)
  Anesthesia Post-op Note  Patient: Roy [name]  Procedure(s) Performed: Procedure(s) (LRB): CATARACT EXTRACTION PHACO AND INTRAOCULAR LENS PLACEMENT (IOC) (Right)  Patient Location:  Short Stay  Anesthesia Type: MAC  Level of Consciousness: awake  Airway and Oxygen Therapy: Patient Spontanous Breathing  Post-op Pain: none  Post-op Assessment: Post-op Vital signs reviewed, Patient's Cardiovascular Status Stable, Respiratory Function Stable, Patent Airway, No signs of Nausea or vomiting and Pain level controlled  Post-op Vital Signs: Reviewed and stable  Complications: No apparent anesthesia complications

## 2015-03-08 NOTE — Anesthesia Procedure Notes (Signed)
Procedure Name: MAC Date/Time: 03/08/2015 9:03 AM Performed by: Vista Deck Pre-anesthesia Checklist: Patient identified, Emergency Drugs available, Suction available, Timeout performed and Patient being monitored Patient Re-evaluated:Patient Re-evaluated prior to inductionOxygen Delivery Method: Nasal Cannula

## 2015-03-08 NOTE — Op Note (Signed)
Patient brought to the operating room and prepped and draped in the usual manner.  Lid speculum inserted in right eye.  Stab incision made at the twelve o'clock position.  Provisc instilled in the anterior chamber.   A 2.4 mm. Stab incision was made temporally.  An anterior capsulotomy was done with a bent 25 gauge needle.  The nucleus was hydrodissected.  The Phaco tip was inserted in the anterior chamber and the nucleus was emulsified.  CDE was 9.46.  The cortical material was then removed with the I and A tip.  Posterior capsule was the polished.  The anterior chamber was deepened with Provisc.  A 21.5 Diopter Alcon [id number] IOL was then inserted in the capsular bag.  Provisc was then removed with the I and A tip.  The wound was then hydrated.  Patient sent to the Recovery Room in good condition with follow up in my office.  Preoperative Diagnosis:  Nuclear Cataract OD Postoperative Diagnosis:  Same Procedure name: Kelman Phacoemulsification OD with IOL

## 2015-03-08 NOTE — Transfer of Care (Signed)
Immediate Anesthesia Transfer of Care Note  Patient: Roy [name]  Procedure(s) Performed: Procedure(s) (LRB): CATARACT EXTRACTION PHACO AND INTRAOCULAR LENS PLACEMENT (IOC) (Right)  Patient Location: Shortstay  Anesthesia Type: MAC  Level of Consciousness: awake  Airway & Oxygen Therapy: Patient Spontanous Breathing   Post-op Assessment: Report given to PACU RN, Post -op Vital signs reviewed and stable and Patient moving all extremities  Post vital signs: Reviewed and stable  Complications: No apparent anesthesia complications

## 2015-03-09 ENCOUNTER — Encounter (HOSPITAL_COMMUNITY): Payer: Self-pay | Admitting: Ophthalmology

## 2015-03-15 DIAGNOSIS — H2512 Age-related nuclear cataract, left eye: Secondary | ICD-10-CM | POA: Diagnosis not present

## 2015-03-17 ENCOUNTER — Encounter (HOSPITAL_COMMUNITY): Payer: Self-pay

## 2015-03-17 ENCOUNTER — Encounter (HOSPITAL_COMMUNITY)
Admission: RE | Admit: 2015-03-17 | Discharge: 2015-03-17 | Disposition: A | Discharge status: Home / Self Care | Payer: Medicare Other | Source: Ambulatory Visit | Attending: Ophthalmology | Admitting: Ophthalmology

## 2015-03-21 MED ORDER — TETRACAINE HCL 0.5 % OP SOLN
OPHTHALMIC | Status: AC
  Filled 2015-03-21: qty 2

## 2015-03-21 MED ORDER — CYCLOPENTOLATE-PHENYLEPHRINE OP SOLN OPTIME - NO CHARGE
OPHTHALMIC | Status: AC
  Filled 2015-03-21: qty 2

## 2015-03-21 MED ORDER — KETOROLAC TROMETHAMINE 0.5 % OP SOLN
OPHTHALMIC | Status: AC
  Filled 2015-03-21: qty 5

## 2015-03-21 MED ORDER — PHENYLEPHRINE HCL 2.5 % OP SOLN
OPHTHALMIC | Status: AC
  Filled 2015-03-21: qty 15

## 2015-03-22 ENCOUNTER — Encounter (HOSPITAL_COMMUNITY)
Admission: RE | Disposition: A | Discharge status: Home / Self Care | Payer: Self-pay | Source: Ambulatory Visit | Attending: Ophthalmology

## 2015-03-22 ENCOUNTER — Ambulatory Visit (HOSPITAL_COMMUNITY): Payer: Medicare Other | Admitting: Anesthesiology

## 2015-03-22 ENCOUNTER — Encounter (HOSPITAL_COMMUNITY): Payer: Self-pay | Admitting: *Deleted

## 2015-03-22 ENCOUNTER — Ambulatory Visit (HOSPITAL_COMMUNITY)
Admission: RE | Admit: 2015-03-22 | Discharge: 2015-03-22 | Disposition: A | Discharge status: Home / Self Care | Payer: Medicare Other | Source: Ambulatory Visit | Attending: Ophthalmology | Admitting: Ophthalmology

## 2015-03-22 DIAGNOSIS — J449 Chronic obstructive pulmonary disease, unspecified: Secondary | ICD-10-CM | POA: Insufficient documentation

## 2015-03-22 DIAGNOSIS — F17200 Nicotine dependence, unspecified, uncomplicated: Secondary | ICD-10-CM | POA: Insufficient documentation

## 2015-03-22 DIAGNOSIS — I509 Heart failure, unspecified: Secondary | ICD-10-CM | POA: Diagnosis not present

## 2015-03-22 DIAGNOSIS — H2512 Age-related nuclear cataract, left eye: Secondary | ICD-10-CM | POA: Diagnosis not present

## 2015-03-22 DIAGNOSIS — M1990 Unspecified osteoarthritis, unspecified site: Secondary | ICD-10-CM | POA: Diagnosis not present

## 2015-03-22 DIAGNOSIS — E780 Pure hypercholesterolemia: Secondary | ICD-10-CM | POA: Insufficient documentation

## 2015-03-22 DIAGNOSIS — I739 Peripheral vascular disease, unspecified: Secondary | ICD-10-CM | POA: Diagnosis not present

## 2015-03-22 DIAGNOSIS — Z79899 Other long term (current) drug therapy: Secondary | ICD-10-CM | POA: Insufficient documentation

## 2015-03-22 DIAGNOSIS — D649 Anemia, unspecified: Secondary | ICD-10-CM | POA: Insufficient documentation

## 2015-03-22 DIAGNOSIS — Z7902 Long term (current) use of antithrombotics/antiplatelets: Secondary | ICD-10-CM | POA: Insufficient documentation

## 2015-03-22 DIAGNOSIS — I252 Old myocardial infarction: Secondary | ICD-10-CM | POA: Diagnosis not present

## 2015-03-22 DIAGNOSIS — F1910 Other psychoactive substance abuse, uncomplicated: Secondary | ICD-10-CM | POA: Insufficient documentation

## 2015-03-22 DIAGNOSIS — H259 Unspecified age-related cataract: Secondary | ICD-10-CM | POA: Diagnosis not present

## 2015-03-22 DIAGNOSIS — I10 Essential (primary) hypertension: Secondary | ICD-10-CM | POA: Diagnosis not present

## 2015-03-22 HISTORY — PX: CATARACT EXTRACTION W/PHACO: SHX586

## 2015-03-22 SURGERY — PHACOEMULSIFICATION, CATARACT, WITH IOL INSERTION
Anesthesia: Monitor Anesthesia Care | Site: Eye | Laterality: Left

## 2015-03-22 MED ORDER — MEPERIDINE HCL 50 MG/ML IJ SOLN: 6.2500 mg | Freq: Once | INTRAMUSCULAR | Status: DC

## 2015-03-22 MED ORDER — LIDOCAINE HCL (PF) 1 % IJ SOLN
INTRAMUSCULAR | Status: AC
  Filled 2015-03-22: qty 2

## 2015-03-22 MED ORDER — TETRACAINE HCL 0.5 % OP SOLN
1.0000 [drp] | OPHTHALMIC | Status: AC
  Administered 2015-03-22 (×3): 1 [drp] via OPHTHALMIC

## 2015-03-22 MED ORDER — LACTATED RINGERS IV SOLN
INTRAVENOUS | Status: DC
  Administered 2015-03-22: 07:00:00 via INTRAVENOUS

## 2015-03-22 MED ORDER — PHENYLEPHRINE HCL 2.5 % OP SOLN
1.0000 [drp] | OPHTHALMIC | Status: AC
  Administered 2015-03-22 (×3): 1 [drp] via OPHTHALMIC

## 2015-03-22 MED ORDER — PROVISC 10 MG/ML IO SOLN
INTRAOCULAR | Status: DC | PRN
  Administered 2015-03-22: 0.85 mL via INTRAOCULAR

## 2015-03-22 MED ORDER — EPINEPHRINE HCL 1 MG/ML IJ SOLN
INTRAMUSCULAR | Status: AC
  Filled 2015-03-22: qty 1

## 2015-03-22 MED ORDER — MIDAZOLAM HCL 2 MG/2ML IJ SOLN
1.0000 mg | INTRAMUSCULAR | Status: DC | PRN
  Administered 2015-03-22: 2 mg via INTRAVENOUS
  Filled 2015-03-22: qty 2

## 2015-03-22 MED ORDER — ONDANSETRON HCL 4 MG/2ML IJ SOLN
4.0000 mg | Freq: Once | INTRAMUSCULAR | Status: DC | PRN
Start: 2015-03-22 — End: 2015-03-22

## 2015-03-22 MED ORDER — FENTANYL CITRATE (PF) 100 MCG/2ML IJ SOLN: 25.0000 ug | INTRAMUSCULAR | Status: DC | PRN

## 2015-03-22 MED ORDER — CYCLOPENTOLATE-PHENYLEPHRINE 0.2-1 % OP SOLN
1.0000 [drp] | OPHTHALMIC | Status: AC
  Administered 2015-03-22 (×3): 1 [drp] via OPHTHALMIC

## 2015-03-22 MED ORDER — BSS IO SOLN
INTRAOCULAR | Status: DC | PRN
  Administered 2015-03-22: 15 mL

## 2015-03-22 MED ORDER — KETOROLAC TROMETHAMINE 0.5 % OP SOLN
1.0000 [drp] | OPHTHALMIC | Status: AC
  Administered 2015-03-22 (×3): 1 [drp] via OPHTHALMIC

## 2015-03-22 MED ORDER — FENTANYL CITRATE (PF) 100 MCG/2ML IJ SOLN
25.0000 ug | INTRAMUSCULAR | Status: AC
  Administered 2015-03-22: 25 ug via INTRAVENOUS
  Filled 2015-03-22: qty 2

## 2015-03-22 MED ORDER — EPINEPHRINE HCL 1 MG/ML IJ SOLN
INTRAOCULAR | Status: DC | PRN
  Administered 2015-03-22: 500 mL

## 2015-03-22 SURGICAL SUPPLY — 9 items
CLOTH BEACON ORANGE TIMEOUT ST (SAFETY) ×2 IMPLANT
EYE SHIELD UNIVERSAL CLEAR (GAUZE/BANDAGES/DRESSINGS) ×2 IMPLANT
GLOVE BIO SURGEON STRL SZ 6.5 (GLOVE) ×1 IMPLANT
GLOVE BIO SURGEONS STRL SZ 6.5 (GLOVE) ×1
GLOVE EXAM NITRILE MD LF STRL (GLOVE) ×2 IMPLANT
LENS ALC ACRYL/TECN (Ophthalmic Related) ×3 IMPLANT
PAD ARMBOARD 7.5X6 YLW CONV (MISCELLANEOUS) ×2 IMPLANT
TAPE TRANSPORE STRL 2 31045 (GAUZE/BANDAGES/DRESSINGS) ×2 IMPLANT
WATER STERILE IRR 250ML POUR (IV SOLUTION) ×2 IMPLANT

## 2015-03-22 NOTE — Transfer of Care (Signed)
Immediate Anesthesia Transfer of Care Note  Patient: Roy [name]  Procedure(s) Performed: Procedure(s) (LRB): CATARACT EXTRACTION PHACO AND INTRAOCULAR LENS PLACEMENT (IOC) (Left)  Patient Location: Shortstay  Anesthesia Type: MAC  Level of Consciousness: awake  Airway & Oxygen Therapy: Patient Spontanous Breathing   Post-op Assessment: Report given to PACU RN, Post -op Vital signs reviewed and stable and Patient moving all extremities  Post vital signs: Reviewed and stable  Complications: No apparent anesthesia complications

## 2015-03-22 NOTE — Discharge Instructions (Signed)
Roy [name]  03/22/2015           [facility] Instructions [place] [address]      1. Avoid closing eyes tightly. One often closes the eye tightly when laughing, talking, sneezing, coughing or if they feel irritated. At these times, you should be careful not to close your eyes tightly.  2. Instill eye drops as instructed. To instill drops in your eye, open it, look up and have someone gently pull the lower lid down and instill a couple of drops inside the lower lid.  3. Do not touch upper lid.  4. Take Advil or Tylenol for pain.  5. You may use either eye for near work, such as reading or sewing and you may watch television.  6. You may have your hair done at the beauty parlor at any time.  7. Wear dark glasses with or without your own glasses if you are in bright light.  8. Call our office at [phone] or [phone] if you have sharp pain in your eye or unusual symptoms.  9. Do not be concerned because vision in the operative eye is not good. It will not be good, no matter how successful the operation, until you get a special lens for it. Your old glasses will not be suited to the new eye that was operated on and you will not be ready for a new lens for about a month.  10. Follow up at the [facility] office. Today between 2 and 3:00.    I have received a copy of the above instructions and will follow them.

## 2015-03-22 NOTE — Op Note (Signed)
Patient brought to the operating room and prepped and draped in the usual manner.  Lid speculum inserted in left eye.  Stab incision made at the twelve o'clock position.  Provisc instilled in the anterior chamber.   A 2.4 mm. Stab incision was made temporally.  An anterior capsulotomy was done with a bent 25 gauge needle.  The nucleus was hydrodissected.  The Phaco tip was inserted in the anterior chamber and the nucleus was emulsified.  CDE was 5.80  The cortical material was then removed with the I and A tip.  Posterior capsule was the polished.  The anterior chamber was deepened with Provisc.  A 21,5 Diopter Alcon SN60WF IOL was then inserted in the capsular bag.  Provisc was then removed with the I and A tip.  The wound was then hydrated.  Patient sent to the Recovery Room in good condition with follow up in my office.  Preoperative Diagnosis:  Nuclear Cataract OS Postoperative Diagnosis:  Same Procedure name: Kelman Phacoemulsification OS with IOL

## 2015-03-22 NOTE — Anesthesia Procedure Notes (Signed)
Procedure Name: MAC Date/Time: 03/22/2015 7:18 AM Performed by: Vista Deck Pre-anesthesia Checklist: Patient identified, Emergency Drugs available, Suction available, Timeout performed and Patient being monitored Patient Re-evaluated:Patient Re-evaluated prior to inductionOxygen Delivery Method: Nasal Cannula

## 2015-03-22 NOTE — Anesthesia Preprocedure Evaluation (Signed)
Anesthesia Evaluation  Patient identified by MRN, date of birth, ID band Patient awake    Reviewed: Allergy & Precautions, NPO status , Patient's Chart, lab work & pertinent test results  Airway Mallampati: I  TM Distance: >3 FB     Dental  (+) Edentulous Upper, Edentulous Lower   Pulmonary COPDCurrent Smoker,  breath sounds clear to auscultation        Cardiovascular hypertension, Pt. on medications + Past MI, + Peripheral Vascular Disease and +CHF Rhythm:Regular     Neuro/Psych PSYCHIATRIC DISORDERS Anxiety Depression    GI/Hepatic (+)     substance abuse  alcohol use,   Endo/Other    Renal/GU      Musculoskeletal   Abdominal   Peds  Hematology  (+) anemia ,   Anesthesia Other Findings   Reproductive/Obstetrics                             Anesthesia Physical Anesthesia Plan  ASA: IV  Anesthesia Plan: MAC   Post-op Pain Management:    Induction: Intravenous  Airway Management Planned: Nasal Cannula  Additional Equipment:   Intra-op Plan:   Post-operative Plan:   Informed Consent: I have reviewed the patients History and Physical, chart, labs and discussed the procedure including the risks, benefits and alternatives for the proposed anesthesia with the patient or authorized representative who has indicated his/her understanding and acceptance.     Plan Discussed with:   Anesthesia Plan Comments:         Anesthesia Quick Evaluation

## 2015-03-22 NOTE — H&P (Signed)
The patient was re examined and there is no change in the patients condition since the original H and P. 

## 2015-03-22 NOTE — Anesthesia Postprocedure Evaluation (Signed)
  Anesthesia Post-op Note  Patient: Roy [name]  Procedure(s) Performed: Procedure(s) (LRB): CATARACT EXTRACTION PHACO AND INTRAOCULAR LENS PLACEMENT (IOC) (Left)  Patient Location:  Short Stay  Anesthesia Type: MAC  Level of Consciousness: awake  Airway and Oxygen Therapy: Patient Spontanous Breathing  Post-op Pain: none  Post-op Assessment: Post-op Vital signs reviewed, Patient's Cardiovascular Status Stable, Respiratory Function Stable, Patent Airway, No signs of Nausea or vomiting and Pain level controlled  Post-op Vital Signs: Reviewed and stable  Complications: No apparent anesthesia complications

## 2015-03-23 ENCOUNTER — Encounter (HOSPITAL_COMMUNITY): Payer: Self-pay | Admitting: Ophthalmology

## 2015-03-24 ENCOUNTER — Other Ambulatory Visit: Payer: Self-pay | Admitting: Gastroenterology

## 2015-04-28 ENCOUNTER — Ambulatory Visit (INDEPENDENT_AMBULATORY_CARE_PROVIDER_SITE_OTHER): Payer: Medicare Other | Admitting: Gastroenterology

## 2015-04-28 ENCOUNTER — Encounter: Payer: Self-pay | Admitting: Gastroenterology

## 2015-04-28 ENCOUNTER — Other Ambulatory Visit: Payer: Self-pay

## 2015-04-28 ENCOUNTER — Other Ambulatory Visit: Payer: Self-pay | Admitting: Gastroenterology

## 2015-04-28 VITALS — BP 158/72 | HR 67 | Temp 97.0°F | Ht 63.0 in | Wt 161.4 lb

## 2015-04-28 DIAGNOSIS — R195 Other fecal abnormalities: Secondary | ICD-10-CM

## 2015-04-28 DIAGNOSIS — I2510 Atherosclerotic heart disease of native coronary artery without angina pectoris: Secondary | ICD-10-CM | POA: Diagnosis not present

## 2015-04-28 DIAGNOSIS — R197 Diarrhea, unspecified: Secondary | ICD-10-CM

## 2015-04-28 DIAGNOSIS — K7030 Alcoholic cirrhosis of liver without ascites: Secondary | ICD-10-CM

## 2015-04-28 NOTE — Progress Notes (Signed)
Subjective:    Patient ID: Roy [name], male    DOB: 1946-10-04, 69 y.o.   MRN: [id number]  Roy Cahill, MD  HPI NO NL STOOL FOR 4 WEEKS. HAVING WATERY/LOOSE STOOLS-JUST ENOUGH TO MAKE TISSUE DIRTY. NO ACCIDENTS. NT SURE IF WHEN HE PASSES GAS HE WILL HAVE A SML BM. HAVING DARK STOOLS BUT ON IRON. BMs: 3-4/DAY. ASSOCIATED WITH RARE RLQ PAIN. TRAVEL: NO. FRESH WATER: YES. NO SICK CONTACT. NO ABX.NO NOCTURNAL STOOLS. HAS NEW MEDS-GABAPENTIN, NEW HEART MED. MILK IN POTATOES, ICE CREAM: 2X/MO, CHEESE: <1X/MO. [place] x 26 Feb 2015. EATING MORE VEGGIES LATELY.  PT DENIES FEVER, CHILLS, HEMATOCHEZIA, HEMATEMESIS, nausea, vomiting, melena, CHEST PAIN, SHORTNESS OF BREATH,  CHANGE IN BOWEL IN HABITS, constipation,  problems swallowing, OR heartburn or indigestion. NO SORES IN MOUTH, RASH ON LEGS, JOINT PAIN, OR BACK PAIN.  Past Medical History  Diagnosis Date  . Cerebrovascular disease 2009    TIA; 2009- right ICA stent; re-intervention for restenosis complicated by [facility] w/o sx  . Degenerative joint disease     Total shoulder arthroplasty-right  . LBBB (left bundle branch block)     Normal echo-2011; stress nuclear in 09/2010--septal hypoperfusion representing nontransmural infarction or the effect of left bundle branch block, no ischemia  . Hyperlipidemia   . Hypertension   . Chronic obstructive pulmonary disease   . Tobacco abuse     -100 pack years; 1.5 packs per day  . Anxiety and depression   . Alcohol abuse     6 beers per day; hospital admission in 2009 for withdrawal symptoms  . Thrombocytopenia   . Tubular adenoma of colon   . Myocardial infarction June 02, 2014    Massive Heart Attack  . CHF (congestive heart failure) 06-02-14  . Alcoholic cirrhosis    Past Surgical History  Procedure Laterality Date  . Lumbar fusion  2010  . Vasectomy  1971  . Colonoscopy w/ polypectomy  2011  . Total shoulder arthroplasty  2011    Right-Dr. Tamera Punt  . Colonoscopy  08/22/09   Roy [name]-(Tubular Adenoma)3-mm transverse polyp/4-mm polyp otherwise noraml/small internal hemorrhoids  . Esophagogastroduodenoscopy [name] 03/05/2014    Procedure: ESOPHAGOGASTRODUODENOSCOPY (EGD);  Surgeon: Danie Binder, MD;  Location: [facility];  Service: Endoscopy;  Laterality: [name];  11:45  . Agile capsule [name] 03/18/2014    Procedure: AGILE CAPSULE;  Surgeon: Danie Binder, MD;  Location: [facility] SUITE;  Service: Endoscopy;  Laterality: [name];  7:30  . Givens capsule study [name] 03/30/2014    Procedure: GIVENS CAPSULE STUDY;  Surgeon: Danie Binder, MD;  Location: [facility];  Service: Endoscopy;  Laterality: [name];  7:30  . Colonoscopy [name] 04/14/2014    BPZ:WCHEN internal hemorrhids/normal mocsa in the terminal iluem/left colonis redundant  . Abdominal aortagram [name] 05/04/2014    Procedure: ABDOMINAL Maxcine Ham;  Surgeon: [name], MD;  Location: [facility] CATH LAB;  Service: Cardiovascular;  Laterality: [name];  . Joint replacement Right   . Spine surgery    . Cataract extraction w/phaco Right 03/08/2015      . Cataract extraction w/phaco Left 03/22/2015       No Known Allergies  Current Outpatient Prescriptions  Medication Sig Dispense Refill  . amLODipine-benazepril (LOTREL) 10-20 MG per capsule Take 1 capsule by mouth daily.    [name] aspirin EC 81 MG tablet Take 81 mg by mouth every evening.     [name] atorvastatin (LIPITOR) 20 MG tablet Take 20 mg by mouth daily.     [name]  carvedilol (COREG) 3.125 MG tablet Take 1 tablet (3.125 mg total) by mouth 2 (two) times daily.    . cetirizine (ZYRTEC) 10 MG tablet Take 10 mg by mouth daily.    . clopidogrel (PLAVIX) 75 MG tablet Take 75 mg by mouth every evening.     . Coenzyme Q10 (CO Q 10) 100 MG CAPS Take 100 mg by mouth every evening.     . cyanocobalamin 2000 MCG tablet Take 2,000 mcg by mouth daily.    . folic acid (FOLVITE) 1 MG tablet Take 1 tablet (1 mg total) by mouth daily.    . furosemide (LASIX) 20 MG tablet Take 20 mg by mouth daily.     [name]  gabapentin (NEURONTIN) 100 MG capsule Take 100 mg by mouth 3 (three) times daily.    . Iron-FA-B Cmp-C-Biot-Probiotic (FUSION PLUS) CAPS Take by mouth daily.    [name] LORazepam (ATIVAN) 1 MG tablet Take 1 mg by mouth 3 (three) times daily.    . Multiple Vitamins-Minerals (CENTRUM SILVER PO) Take 0.5 tablets by mouth 2 (two) times daily.     . pantoprazole (PROTONIX) 40 MG tablet TAKE ONE TABLET BY MOUTH PRIOR TO MEALS TWICE DAILY FOR 3 MONTHS THENDAILY THEREAFTER.    [name] PARoxetine (PAXIL) 20 MG tablet Take 20 mg by mouth daily.    . potassium chloride (KLOR-CON) 20 MEQ packet Take 20 mEq by mouth daily.     . Prenatal Vit-Fe Fumarate-FA (PRENATAL MULTIVITAMIN) TABS tablet Take 1 tablet by mouth daily at 12 noon.    . pyridOXINE (VITAMIN B-6) 100 MG tablet Take 100 mg by mouth daily.    . sacubitril-valsartan (ENTRESTO) 49-51 MG Take 1 tablet by mouth 2 (two) times daily.    [name] thiamine 100 MG tablet Take 1 tablet (100 mg total) by mouth daily.    . vitamin C (ASCORBIC ACID) 500 MG tablet Take 500 mg by mouth daily.     Review of Systems     Objective:   Physical Exam  Constitutional: He is oriented to person, place, and time. He appears well-developed and well-nourished. No distress.  HENT:  Head: Normocephalic and atraumatic.  Mouth/Throat: Oropharynx is clear and moist. No oropharyngeal exudate.  Eyes: Pupils are equal, round, and reactive to light. No scleral icterus.  Neck: Normal range of motion. Neck supple.  Cardiovascular: Normal rate, regular rhythm and normal heart sounds.   Pulmonary/Chest: Effort normal and breath sounds normal. No respiratory distress.  Abdominal: Soft. Bowel sounds are normal. He exhibits no distension. There is no tenderness.  Musculoskeletal: He exhibits no edema.  Lymphadenopathy:    He has no cervical adenopathy.  Neurological: He is alert and oriented to person, place, and time.  NO FOCAL DEFICITS   Psychiatric: He has a normal mood and affect.  Vitals  reviewed.         Assessment & Plan:

## 2015-04-28 NOTE — Assessment & Plan Note (Signed)
ASSOCIATED WITH RARE RLQ ABD PAIN. SYMPTOMS MOST LIKELY DUE TO DIET, LESS LIKELY GIARDIA OR C DIFF COLITIS. NO WEIGHT LOSS.  STOOL FOR GIARDIA/C DIFF. IF NEGATIVE, CONSIDER HBT FOR SIBO. KAOPECTATE PRN FOLLOW UP IN 4 MOS.

## 2015-04-28 NOTE — Patient Instructions (Signed)
COMPLETE ULTRASOUND.  SUBMIT STOOL SAMPLE.  FOLLOW UP IN 4 MOS.

## 2015-04-28 NOTE — Progress Notes (Signed)
REVIEWED-NO ADDITIONAL RECOMMENDATIONS. 

## 2015-04-28 NOTE — Assessment & Plan Note (Signed)
WELL COMPENSATED DISEASE.  RUQ U/S Q6 MOS FOLLOW UP IN 4 MOS.

## 2015-04-29 DIAGNOSIS — R197 Diarrhea, unspecified: Secondary | ICD-10-CM | POA: Diagnosis not present

## 2015-04-30 LAB — CLOSTRIDIUM DIFFICILE BY PCR: Toxigenic C. Difficile by PCR: NOT DETECTED

## 2015-05-02 ENCOUNTER — Ambulatory Visit (HOSPITAL_COMMUNITY)
Admission: RE | Admit: 2015-05-02 | Discharge: 2015-05-02 | Disposition: A | Discharge status: Home / Self Care | Payer: Medicare Other | Source: Ambulatory Visit | Attending: Gastroenterology | Admitting: Gastroenterology

## 2015-05-02 DIAGNOSIS — K7030 Alcoholic cirrhosis of liver without ascites: Secondary | ICD-10-CM

## 2015-05-02 DIAGNOSIS — K760 Fatty (change of) liver, not elsewhere classified: Secondary | ICD-10-CM | POA: Diagnosis not present

## 2015-05-02 LAB — GIARDIA ANTIGEN: GIARDIA SCREEN (EIA): NEGATIVE

## 2015-05-10 ENCOUNTER — Telehealth: Payer: Self-pay | Admitting: Gastroenterology

## 2015-05-10 NOTE — Telephone Encounter (Signed)
LMOM to call.

## 2015-05-10 NOTE — Telephone Encounter (Addendum)
PLEASE CALL PT. HIS STOOL TESTS ARE NORMAL. IF HE IS STILL HAVING DIARRHEA, HE SHOULD COMPLETE THE HYDROGEN BREATH TEST TO SEE IF HE HAS THE WRONG BACTERIA MIX IN HIS SMALL BOWEL WHICH CAN CAUSE DIARRHEA AND ABDOMINAL PAIN. HIS U/ S SHOWS FAT IN HIS LIVER. IT COMES FROM DRINKING ALCOHOL.

## 2015-05-11 NOTE — Progress Notes (Signed)
cc'd to PCP °

## 2015-05-11 NOTE — Telephone Encounter (Signed)
Tried to call with no answer  

## 2015-05-12 NOTE — Telephone Encounter (Signed)
Letter mailed for him to call the office

## 2015-05-19 ENCOUNTER — Telehealth: Payer: Self-pay

## 2015-05-19 ENCOUNTER — Other Ambulatory Visit: Payer: Self-pay

## 2015-05-19 DIAGNOSIS — R109 Unspecified abdominal pain: Secondary | ICD-10-CM

## 2015-05-19 DIAGNOSIS — R197 Diarrhea, unspecified: Secondary | ICD-10-CM

## 2015-05-19 NOTE — Telephone Encounter (Signed)
Pt called office back and he is aware of Dr. Oneida Alar recommendations. Wants to proceed with the HBT. States that he has had no changes.

## 2015-05-19 NOTE — Telephone Encounter (Signed)
Error see other phone note

## 2015-05-24 ENCOUNTER — Encounter (HOSPITAL_COMMUNITY)
Admission: RE | Disposition: A | Discharge status: Home / Self Care | Payer: Self-pay | Source: Ambulatory Visit | Attending: Gastroenterology

## 2015-05-24 ENCOUNTER — Ambulatory Visit (HOSPITAL_COMMUNITY)
Admission: RE | Admit: 2015-05-24 | Discharge: 2015-05-24 | Disposition: A | Discharge status: Home / Self Care | Payer: Medicare Other | Source: Ambulatory Visit | Attending: Gastroenterology | Admitting: Gastroenterology

## 2015-05-24 DIAGNOSIS — R197 Diarrhea, unspecified: Secondary | ICD-10-CM | POA: Diagnosis not present

## 2015-05-24 DIAGNOSIS — R109 Unspecified abdominal pain: Secondary | ICD-10-CM | POA: Insufficient documentation

## 2015-05-24 HISTORY — PX: BACTERIAL OVERGROWTH TEST: SHX5739

## 2015-05-24 SURGERY — BREATH TEST, FOR INTESTINAL BACTERIAL OVERGROWTH

## 2015-05-24 MED ORDER — LACTULOSE 10 GM/15ML PO SOLN
ORAL | Status: AC
  Filled 2015-05-24: qty 60

## 2015-05-24 MED ORDER — LACTULOSE 10 GM/15ML PO SOLN
25.0000 g | Freq: Once | ORAL | Status: AC
  Administered 2015-05-24: 25 g via ORAL

## 2015-05-24 NOTE — Progress Notes (Addendum)
No beans, bran or high fiber cereal the day before the procedure? no NPO except for water 12 hours before procedure? no No smoking, sleeping or vigorous exercising for at least 30 before procedure? no Recent antibiotic use and/or diarrhea? no   If yes, physician notified.  Time Baseline 15 mins 30 mins 45 mins 60 mins 75 mins 90 mins 105 mins 120 mins 135 mins 150 mins 165 mins 180 mins  H2-ppm 1 2 4 4 3 3 3 2 3         Patient requesting to go home.  Dr Oneida Alar given results of [place] to discharge home.

## 2015-05-25 ENCOUNTER — Encounter (HOSPITAL_COMMUNITY): Payer: Self-pay | Admitting: Gastroenterology

## 2015-05-27 NOTE — Procedures (Signed)
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 AVOID FOOD THAT TRIGGERS LOOSE STOOLS. 2. KAOPECTATE PRN 3. Follow up in OCT 2016.

## 2015-06-09 ENCOUNTER — Encounter: Payer: Self-pay | Admitting: Family

## 2015-06-13 ENCOUNTER — Ambulatory Visit (HOSPITAL_COMMUNITY)
Admission: RE | Admit: 2015-06-13 | Discharge: 2015-06-13 | Disposition: A | Discharge status: Home / Self Care | Payer: Medicare Other | Source: Ambulatory Visit | Attending: Family | Admitting: Family

## 2015-06-13 ENCOUNTER — Ambulatory Visit (INDEPENDENT_AMBULATORY_CARE_PROVIDER_SITE_OTHER)
Admission: RE | Admit: 2015-06-13 | Discharge: 2015-06-13 | Disposition: A | Discharge status: Home / Self Care | Payer: Medicare Other | Source: Ambulatory Visit | Attending: Family | Admitting: Family

## 2015-06-13 ENCOUNTER — Ambulatory Visit (INDEPENDENT_AMBULATORY_CARE_PROVIDER_SITE_OTHER): Payer: Medicare Other | Admitting: Family

## 2015-06-13 ENCOUNTER — Encounter: Payer: Self-pay | Admitting: Family

## 2015-06-13 ENCOUNTER — Other Ambulatory Visit: Payer: Self-pay | Admitting: Family

## 2015-06-13 VITALS — BP 138/74 | HR 59 | Temp 97.3°F | Resp 14 | Ht 63.0 in | Wt 157.0 lb

## 2015-06-13 DIAGNOSIS — F17200 Nicotine dependence, unspecified, uncomplicated: Secondary | ICD-10-CM

## 2015-06-13 DIAGNOSIS — I739 Peripheral vascular disease, unspecified: Secondary | ICD-10-CM

## 2015-06-13 DIAGNOSIS — Z48812 Encounter for surgical aftercare following surgery on the circulatory system: Secondary | ICD-10-CM

## 2015-06-13 DIAGNOSIS — E785 Hyperlipidemia, unspecified: Secondary | ICD-10-CM | POA: Diagnosis not present

## 2015-06-13 DIAGNOSIS — I70219 Atherosclerosis of native arteries of extremities with intermittent claudication, unspecified extremity: Secondary | ICD-10-CM | POA: Diagnosis not present

## 2015-06-13 DIAGNOSIS — Z95820 Peripheral vascular angioplasty status with implants and grafts: Secondary | ICD-10-CM | POA: Insufficient documentation

## 2015-06-13 DIAGNOSIS — Z9889 Other specified postprocedural states: Secondary | ICD-10-CM | POA: Diagnosis not present

## 2015-06-13 DIAGNOSIS — Z95828 Presence of other vascular implants and grafts: Secondary | ICD-10-CM

## 2015-06-13 DIAGNOSIS — I10 Essential (primary) hypertension: Secondary | ICD-10-CM | POA: Diagnosis not present

## 2015-06-13 DIAGNOSIS — Z720 Tobacco use: Secondary | ICD-10-CM | POA: Diagnosis not present

## 2015-06-13 DIAGNOSIS — Z789 Other specified health status: Secondary | ICD-10-CM

## 2015-06-13 NOTE — Progress Notes (Signed)
[facility] [place] HISTORY AND PHYSICAL -PAD  History of Present Illness Roy [name] is a 69 y.o. male patient of Dr. [name] who returns today for followup. He is status post bilateral iliac stenting on 05/04/2014. This was done for lifestyle limiting claudication. He has bilateral calf claudication after walking about 1/4 mile, relieved by rest, he states this is no worse than 6 months ago at his last visit. Pt denies non healing wounds.  He has had a lumbar fusion, states he has low back pain, denies radiculopathy pain. He reports that he has had a TIA in 2010,  had a stent placed in his brain in 2009, states Dr. Estanislado Pandy checks [place] of his neck; had an MI July 2015.  The patient denies New Medical or Surgical History.  Pt Diabetic: No Pt smoker: smoker (1/2 ppd, started at age 33 yrs)  Pt meds include: Statin :Yes Betablocker: Yes ASA: Yes Other anticoagulants/antiplatelets: Plavix     Past Medical History  Diagnosis Date  . Cerebrovascular disease 2009    TIA; 2009- right ICA stent; re-intervention for restenosis complicated by [facility] w/o sx  . Degenerative joint disease     Total shoulder arthroplasty-right  . LBBB (left bundle branch block)     Normal echo-2011; stress nuclear in 09/2010--septal hypoperfusion representing nontransmural infarction or the effect of left bundle branch block, no ischemia  . Hyperlipidemia   . Hypertension   . Chronic obstructive pulmonary disease   . Tobacco abuse     -100 pack years; 1.5 packs per day  . Anxiety and depression   . Alcohol abuse     6 beers per day; hospital admission in 2009 for withdrawal symptoms  . Thrombocytopenia   . Tubular adenoma of colon   . Myocardial infarction June 02, 2014    Massive Heart Attack  . CHF (congestive heart failure) 06-02-14  . Alcoholic cirrhosis   . Peripheral vascular disease     Social History History  Substance Use Topics  . Smoking status: Light Tobacco  Smoker -- 1.25 packs/day for 60 years    Types: E-cigarettes    Start date: 08/20/1957  . Smokeless tobacco: Never Used     Comment: 1.5 pks daily  . Alcohol Use: No     Comment: quit in July 2015; No ETOH to date 02/14/15    Family History Family History  Problem Relation Age of Onset  . Prostate cancer Brother   . Coronary artery disease Mother   . Diabetes Mother   . Heart disease Mother     Before age 64 - 48 Bypasses  . Hypertension Mother   . Heart attack Mother     3-4 Heart attacks  . Alzheimer's disease Sister   . Alzheimer's disease Father   . Diabetes Father   . Colon cancer Neg Hx   . Heart attack Sister   . Cancer Brother     Prostate  . Hyperlipidemia Son     Past Surgical History  Procedure Laterality Date  . Lumbar fusion  2010  . Vasectomy  1971  . Colonoscopy w/ polypectomy  2011  . Total shoulder arthroplasty  2011    Right-Dr. Tamera Punt  . Colonoscopy  08/22/09    Fields-(Tubular Adenoma)3-mm transverse polyp/4-mm polyp otherwise noraml/small internal hemorrhoids  . Esophagogastroduodenoscopy [name] 03/05/2014    Procedure: ESOPHAGOGASTRODUODENOSCOPY (EGD);  Surgeon: Danie Binder, MD;  Location: [facility];  Service: Endoscopy;  Laterality: [name];  11:45  .  Agile capsule [name] 03/18/2014    Procedure: AGILE CAPSULE;  Surgeon: Danie Binder, MD;  Location: [facility];  Service: Endoscopy;  Laterality: [name];  7:30  . Givens capsule study [name] 03/30/2014    Procedure: GIVENS CAPSULE STUDY;  Surgeon: Danie Binder, MD;  Location: [facility];  Service: Endoscopy;  Laterality: [name];  7:30  . Colonoscopy [name] 04/14/2014    KAJ:GOTLX internal hemorrhids/normal mocsa in the terminal iluem/left colonis redundant  . Abdominal aortagram [name] 05/04/2014    Procedure: ABDOMINAL Maxcine Ham;  Surgeon: [name], MD;  Location: [facility] CATH LAB;  Service: Cardiovascular;  Laterality: [name];  . Joint replacement Right   . Spine surgery    . Cataract extraction w/phaco Right  03/08/2015    Procedure: CATARACT EXTRACTION PHACO AND INTRAOCULAR LENS PLACEMENT (IOC);  Surgeon: Rutherford Guys, MD;  Location: [facility] ORS;  Service: Ophthalmology;  Laterality: Right;  CDE:9.46  . Cataract extraction w/phaco Left 03/22/2015    Procedure: CATARACT EXTRACTION PHACO AND INTRAOCULAR LENS PLACEMENT (IOC);  Surgeon: Rutherford Guys, MD;  Location: [facility] OR[facility];  Service: Ophthalmology;  Laterality: Left;  CDE:5.80  . Bacterial overgrowth test [name] 05/24/2015    Procedure: BACTERIAL OVERGROWTH TEST;  Surgeon: Danie Binder, MD;  Location: [facility];  Service: Endoscopy;  Laterality: [name];  0700    No Known Allergies  Current Outpatient Prescriptions  Medication Sig Dispense Refill  . aspirin EC 81 MG tablet Take 81 mg by mouth every evening.     [name] atorvastatin (LIPITOR) 20 MG tablet Take 20 mg by mouth daily.     . carvedilol (COREG) 3.125 MG tablet Take 1 tablet (3.125 mg total) by mouth 2 (two) times daily. 180 tablet 3  . cetirizine (ZYRTEC) 10 MG tablet Take 10 mg by mouth daily.    . clopidogrel (PLAVIX) 75 MG tablet Take 75 mg by mouth every evening.     . Coenzyme Q10 (CO Q 10) 100 MG CAPS Take 100 mg by mouth every evening.     . cyanocobalamin 2000 MCG tablet Take 2,000 mcg by mouth daily.    . folic acid (FOLVITE) 1 MG tablet Take 1 tablet (1 mg total) by mouth daily.    . furosemide (LASIX) 20 MG tablet Take 20 mg by mouth daily.     [name] gabapentin (NEURONTIN) 100 MG capsule Take 100 mg by mouth 3 (three) times daily.    . Iron-FA-B Cmp-C-Biot-Probiotic (FUSION PLUS) CAPS Take by mouth daily.    . Multiple Vitamins-Minerals (CENTRUM SILVER PO) Take 0.5 tablets by mouth 2 (two) times daily.     . pantoprazole (PROTONIX) 40 MG tablet TAKE ONE TABLET BY MOUTH PRIOR TO MEALS TWICE DAILY FOR 3 MONTHS THENDAILY THEREAFTER. 62 tablet 5  . PARoxetine (PAXIL) 20 MG tablet Take 20 mg by mouth daily.    . potassium chloride SA (K-DUR,KLOR-CON) 20 MEQ tablet Take 20 mEq by mouth daily.    .  Prenatal Vit-Fe Fumarate-FA (PRENATAL MULTIVITAMIN) TABS tablet Take 1 tablet by mouth daily at 12 noon.    . pyridOXINE (VITAMIN B-6) 100 MG tablet Take 100 mg by mouth daily.    . sacubitril-valsartan (ENTRESTO) 49-51 MG Take 1 tablet by mouth 2 (two) times daily. 60 tablet 6  . vitamin C (ASCORBIC ACID) 500 MG tablet Take 500 mg by mouth daily.    [name] amLODipine-benazepril (LOTREL) 10-20 MG per capsule Take 1 capsule by mouth daily. (Patient not taking: Reported on 05/19/2015) 90 capsule 3  .  LORazepam (ATIVAN) 1 MG tablet Take 1 mg by mouth 3 (three) times daily.     No current facility-administered medications for this visit.    ROS: See HPI for pertinent positives and negatives.   Physical Examination  Filed Vitals:   06/13/15 0936  BP: 138/74  Pulse: 59  Temp: 97.3 F (36.3 C)  TempSrc: Oral  Resp: 14  Height: 5\' 3"  (1.6 m)  Weight: 157 lb (71.215 kg)  SpO2: 98%   Body mass index is 27.82 kg/(m^2).  General: A&O x 3, WDWN. Gait: normal Eyes: PERRLA. Pulmonary: CTAB, without wheezes , rales or rhonchi. Cardiac: regular Rythm, without detected murmur.     Carotid Bruits Right Left   Negative Negative  Aorta is not palpable. Radial pulses: are 2+ palpable and =   VASCULAR EXAM: Extremities without ischemic changes without Gangrene; without open wounds.     LE Pulses Right Left   FEMORAL 1+ palpable 1+ palpable    POPLITEAL not palpable  not palpable   POSTERIOR TIBIAL faintly palpable  not palpable    DORSALIS PEDIS  ANTERIOR TIBIAL 2+ palpable  not palpable    Abdomen: soft, NT, no palpable masses. Skin: no rashes, no ulcers. Musculoskeletal: no muscle wasting or atrophy. Neurologic: A&O X 3; Appropriate Affect,  MOTOR FUNCTION:  moving all extremities equally, motor strength 5/5 throughout. Speech is fluent/normal. CN 2-12 intact.          Non-Invasive Vascular Imaging: DATE: 06/13/2015 ILIAC ARTERY STENT EVALUATION    INDICATION: Peripheral Vascular Disease    PREVIOUS INTERVENTION(S): Bilateral external iliac artery stents 05/04/2014.    DUPLEX EXAM:     RIGHT  LEFT   Peak Systolic Velocity (cm/s) Ratio (if abnormal) Waveform  Peak Systolic Velocity (cm/s) Ratio (if abnormal) Waveform  64  B Aorta - Distal 64  B  121  B Artery - Proximal to Stent 55/100 1.82 M/B  115  B Stent - Proximal 156  B  169  B Stent - Mid 178  B  274  B Stent - Distal 153  B  NA  NA Artery - Distal to Stent NA  NA  0.71/0.91 Today's ABI / TBI 0.63/0.57  0.99/0.72 Previous ABI / TBI (11/29/2014  ) 0.80/0.70    Waveform:    M - Monophasic       B - Biphasic       T - Triphasic  If Ankle Brachial Index (ABI) or Toe Brachial Index (TBI) performed, please see complete report  ADDITIONAL FINDINGS:     IMPRESSION: Technically difficult and limited visualization of the bilateral iliac arteries due to excessive bowel gas and acoustic shadowing present. Patent bilateral external iliac artery stents. Dampened arterial inflow involving the left common iliac artery which may be suggestive of stenosis however poorly visualized.    Compared to the previous exam:  Decreased ankle brachial indices bilaterally since previous study on 11/29/2014.     ASSESSMENT: AZARIAN STARACE is a 69 y.o. male who is s/p bilateral external iliac artery stents placed 05/04/2014. He has bilateral calf claudication after walking about 1/4 mile, relieved by rest; he states this is not worse in the last six months. He has no non healing wounds. Today's bilateral iliac artery Duplex was a technically difficult and there was limited visualization of the bilateral iliac arteries due to excessive bowel gas and acoustic shadowing. Patent bilateral external iliac  artery stents. Dampened arterial inflow involving the left common iliac artery which may be  suggestive of stenosis however poorly visualized. Decreased ankle brachial indices bilaterally since previous study on 11/29/2014.  Fortunately he does not have DM but unfortunately he continues to smoke and use nicotine vapor product, trying to quit. See Plan.   PLAN:  The patient was counseled re smoking cessation and given several free resources re smoking cessation.  Graduated walking program discussed in detail with pt.  Based on the patient's vascular studies and examination, pt will return to clinic in 6 months with ABI's and bilateral iliac artery stent Duplex.  I discussed in depth with the patient the nature of atherosclerosis, and emphasized the importance of maximal medical management including strict control of blood pressure, blood glucose, and lipid levels, obtaining regular exercise, and cessation of smoking.  The patient is aware that without maximal medical management the underlying atherosclerotic disease process will progress, limiting the benefit of any interventions.  The patient was given information about PAD including signs, symptoms, treatment, what symptoms should prompt the patient to seek immediate medical care, and risk reduction measures to take.  Clemon Chambers, RN, MSN, FNP-C [facility] Phone: [phone]  Clinic MD: [name]  06/13/2015 9:50 AM

## 2015-06-13 NOTE — Addendum Note (Signed)
Addended by: Dorthula Rue L on: 06/13/2015 11:36 AM   Modules accepted: Orders

## 2015-06-13 NOTE — Patient Instructions (Signed)
Smoking Cessation  Quitting smoking is important to your health and has many advantages. However, it is not always easy to quit since nicotine is a very addictive drug. Oftentimes, people try 3 times or more before being able to quit. This document explains the best ways for you to prepare to quit smoking. Quitting takes hard work and a lot of effort, but you can do it.  ADVANTAGES OF QUITTING SMOKING  · You will live longer, feel better, and live better.  · Your body will feel the impact of quitting smoking almost immediately.  · Within 20 minutes, blood pressure decreases. Your pulse returns to its normal level.  · After 8 hours, carbon monoxide levels in the blood return to normal. Your oxygen level increases.  · After 24 hours, the chance of having a heart attack starts to decrease. Your breath, hair, and body stop smelling like smoke.  · After 48 hours, damaged nerve endings begin to recover. Your sense of taste and smell improve.  · After 72 hours, the body is virtually free of nicotine. Your bronchial tubes relax and breathing becomes easier.  · After 2 to 12 weeks, lungs can hold more air. Exercise becomes easier and circulation improves.  · The risk of having a heart attack, stroke, cancer, or lung disease is greatly reduced.  · After 1 year, the risk of coronary heart disease is cut in half.  · After 5 years, the risk of stroke falls to the same as a nonsmoker.  · After 10 years, the risk of lung cancer is cut in half and the risk of other cancers decreases significantly.  · After 15 years, the risk of coronary heart disease drops, usually to the level of a nonsmoker.  · If you are pregnant, quitting smoking will improve your chances of having a healthy baby.  · The people you live with, especially any children, will be healthier.  · You will have extra money to spend on things other than cigarettes.  QUESTIONS TO THINK ABOUT BEFORE ATTEMPTING TO QUIT  You may want to talk about your answers with your  health care provider.  · Why do you want to quit?  · If you tried to quit in the past, what helped and what did not?  · What will be the most difficult situations for you after you quit? How will you plan to handle them?  · Who can help you through the tough times? Your family? Friends? A health care provider?  · What pleasures do you get from smoking? What ways can you still get pleasure if you quit?  Here are some questions to ask your health care provider:  · How can you help me to be successful at quitting?  · What medicine do you think would be best for me and how should I take it?  · What should I do if I need more help?  · What is smoking withdrawal like? How can I get information on withdrawal?  GET READY  · Set a quit date.  · Change your environment by getting rid of all cigarettes, ashtrays, matches, and lighters in your home, car, or work. Do not let people smoke in your home.  · Review your past attempts to quit. Think about what worked and what did not.  GET SUPPORT AND ENCOURAGEMENT  You have a better chance of being successful if you have help. You can get support in many ways.  · Tell your family, friends, and   coworkers that you are going to quit and need their support. Ask them not to smoke around you.  · Get individual, group, or telephone counseling and support. Programs are available at local hospitals and health centers. Call your local health department for information about programs in your area.  · Spiritual beliefs and practices may help some smokers quit.  · Download a "quit meter" on your computer to keep track of quit statistics, such as how long you have gone without smoking, cigarettes not smoked, and money saved.  · Get a self-help book about quitting smoking and staying off tobacco.  LEARN NEW SKILLS AND BEHAVIORS  · Distract yourself from urges to smoke. Talk to someone, go for a walk, or occupy your time with a task.  · Change your normal routine. Take a different route to work.  Drink tea instead of coffee. Eat breakfast in a different place.  · Reduce your stress. Take a hot bath, exercise, or read a book.  · Plan something enjoyable to do every day. Reward yourself for not smoking.  · Explore interactive web-based programs that specialize in helping you quit.  GET MEDICINE AND USE IT CORRECTLY  Medicines can help you stop smoking and decrease the urge to smoke. Combining medicine with the above behavioral methods and support can greatly increase your chances of successfully quitting smoking.  · Nicotine replacement therapy helps deliver nicotine to your body without the negative effects and risks of smoking. Nicotine replacement therapy includes nicotine gum, lozenges, inhalers, nasal sprays, and skin patches. Some may be available over-the-counter and others require a prescription.  · Antidepressant medicine helps people abstain from smoking, but how this works is unknown. This medicine is available by prescription.  · Nicotinic receptor partial agonist medicine simulates the effect of nicotine in your brain. This medicine is available by prescription.  Ask your health care provider for advice about which medicines to use and how to use them based on your health history. Your health care provider will tell you what side effects to look out for if you choose to be on a medicine or therapy. Carefully read the information on the package. Do not use any other product containing nicotine while using a nicotine replacement product.   RELAPSE OR DIFFICULT SITUATIONS  Most relapses occur within the first 3 months after quitting. Do not be discouraged if you start smoking again. Remember, most people try several times before finally quitting. You may have symptoms of withdrawal because your body is used to nicotine. You may crave cigarettes, be irritable, feel very hungry, cough often, get headaches, or have difficulty concentrating. The withdrawal symptoms are only temporary. They are strongest  when you first quit, but they will go away within 10-14 days.  To reduce the chances of relapse, try to:  · Avoid drinking alcohol. Drinking lowers your chances of successfully quitting.  · Reduce the amount of caffeine you consume. Once you quit smoking, the amount of caffeine in your body increases and can give you symptoms, such as a rapid heartbeat, sweating, and anxiety.  · Avoid smokers because they can make you want to smoke.  · Do not let weight gain distract you. Many smokers will gain weight when they quit, usually less than 10 pounds. Eat a healthy diet and stay active. You can always lose the weight gained after you quit.  · Find ways to improve your mood other than smoking.  FOR MORE INFORMATION   www.smokefree.gov   Document Released:   11/06/2001 Document Revised: 03/29/2014 Document Reviewed: 02/21/2012  ExitCare® Patient Information ©2015 [employer]. This information is not intended to replace advice given to you by your health care provider. Make sure you discuss any questions you have with your health care provider.  Smoking Cessation, Tips for Success  If you are ready to quit smoking, congratulations! You have chosen to help yourself be healthier. Cigarettes bring nicotine, tar, carbon monoxide, and other irritants into your body. Your lungs, heart, and blood vessels will be able to work better without these poisons. There are many different ways to quit smoking. Nicotine gum, nicotine patches, a nicotine inhaler, or nicotine nasal spray can help with physical craving. Hypnosis, support groups, and medicines help break the habit of smoking.  WHAT THINGS CAN I DO TO MAKE QUITTING EASIER?   Here are some tips to help you quit for good:  · Pick a date when you will quit smoking completely. Tell all of your friends and family about your plan to quit on that date.  · Do not try to slowly cut down on the number of cigarettes you are smoking. Pick a quit date and quit smoking completely starting on  that day.  · Throw away all cigarettes.    · Clean and remove all ashtrays from your home, work, and car.  · On a card, write down your reasons for quitting. Carry the card with you and read it when you get the urge to smoke.  · Cleanse your body of nicotine. Drink enough water and fluids to keep your urine clear or pale yellow. Do this after quitting to flush the nicotine from your body.  · Learn to predict your moods. Do not let a bad situation be your excuse to have a cigarette. Some situations in your life might tempt you into wanting a cigarette.  · Never have "just one" cigarette. It leads to wanting another and another. Remind yourself of your decision to quit.  · Change habits associated with smoking. If you smoked while driving or when feeling stressed, try other activities to replace smoking. Stand up when drinking your coffee. Brush your teeth after eating. Sit in a different chair when you read the paper. Avoid alcohol while trying to quit, and try to drink fewer caffeinated beverages. Alcohol and caffeine may urge you to smoke.  · Avoid foods and drinks that can trigger a desire to smoke, such as sugary or spicy foods and alcohol.  · Ask people who smoke not to smoke around you.  · Have something planned to do right after eating or having a cup of coffee. For example, plan to take a walk or exercise.  · Try a relaxation exercise to calm you down and decrease your stress. Remember, you may be tense and nervous for the first 2 weeks after you quit, but this will pass.  · Find new activities to keep your hands busy. Play with a pen, coin, or rubber band. Doodle or draw things on paper.  · Brush your teeth right after eating. This will help cut down on the craving for the taste of tobacco after meals. You can also try mouthwash.    · Use oral substitutes in place of cigarettes. Try using lemon drops, carrots, cinnamon sticks, or chewing gum. Keep them handy so they are available when you have the urge to  smoke.  · When you have the urge to smoke, try deep breathing.  · Designate your home as a nonsmoking area.  ·   If you are a heavy smoker, ask your health care provider about a prescription for nicotine chewing gum. It can ease your withdrawal from nicotine.  · Reward yourself. Set aside the cigarette money you save and buy yourself something nice.  · Look for support from others. Join a support group or smoking cessation program. Ask someone at home or at work to help you with your plan to quit smoking.  · Always ask yourself, "Do I need this cigarette or is this just a reflex?" Tell yourself, "Today, I choose not to smoke," or "I do not want to smoke." You are reminding yourself of your decision to quit.  · Do not replace cigarette smoking with electronic cigarettes (commonly called e-cigarettes). The safety of e-cigarettes is unknown, and some may contain harmful chemicals.  · If you relapse, do not give up! Plan ahead and think about what you will do the next time you get the urge to smoke.  HOW WILL I FEEL WHEN I QUIT SMOKING?  You may have symptoms of withdrawal because your body is used to nicotine (the addictive substance in cigarettes). You may crave cigarettes, be irritable, feel very hungry, cough often, get headaches, or have difficulty concentrating. The withdrawal symptoms are only temporary. They are strongest when you first quit but will go away within 10-14 days. When withdrawal symptoms occur, stay in control. Think about your reasons for quitting. Remind yourself that these are signs that your body is healing and getting used to being without cigarettes. Remember that withdrawal symptoms are easier to treat than the major diseases that smoking can cause.   Even after the withdrawal is over, expect periodic urges to smoke. However, these cravings are generally short lived and will go away whether you smoke or not. Do not smoke!  WHAT RESOURCES ARE AVAILABLE TO HELP ME QUIT SMOKING?  Your health care  provider can direct you to community resources or hospitals for support, which may include:  · Group support.  · Education.  · Hypnosis.  · Therapy.  Document Released: 08/10/2004 Document Revised: 03/29/2014 Document Reviewed: 04/30/2013  ExitCare® Patient Information ©2015 [employer]. This information is not intended to replace advice given to you by your health care provider. Make sure you discuss any questions you have with your health care provider.

## 2015-06-22 ENCOUNTER — Other Ambulatory Visit: Payer: Self-pay | Admitting: Cardiovascular Disease

## 2015-07-07 DIAGNOSIS — E785 Hyperlipidemia, unspecified: Secondary | ICD-10-CM | POA: Diagnosis not present

## 2015-07-07 DIAGNOSIS — Z125 Encounter for screening for malignant neoplasm of prostate: Secondary | ICD-10-CM | POA: Diagnosis not present

## 2015-07-11 ENCOUNTER — Ambulatory Visit (INDEPENDENT_AMBULATORY_CARE_PROVIDER_SITE_OTHER): Payer: Medicare Other | Admitting: Cardiovascular Disease

## 2015-07-11 ENCOUNTER — Encounter: Payer: Self-pay | Admitting: Cardiovascular Disease

## 2015-07-11 VITALS — BP 124/80 | HR 68 | Ht 64.0 in | Wt 160.3 lb

## 2015-07-11 DIAGNOSIS — I10 Essential (primary) hypertension: Secondary | ICD-10-CM

## 2015-07-11 DIAGNOSIS — Z716 Tobacco abuse counseling: Secondary | ICD-10-CM | POA: Diagnosis not present

## 2015-07-11 DIAGNOSIS — I739 Peripheral vascular disease, unspecified: Secondary | ICD-10-CM | POA: Diagnosis not present

## 2015-07-11 DIAGNOSIS — E785 Hyperlipidemia, unspecified: Secondary | ICD-10-CM | POA: Diagnosis not present

## 2015-07-11 DIAGNOSIS — I255 Ischemic cardiomyopathy: Secondary | ICD-10-CM

## 2015-07-11 DIAGNOSIS — I519 Heart disease, unspecified: Secondary | ICD-10-CM

## 2015-07-11 DIAGNOSIS — I5022 Chronic systolic (congestive) heart failure: Secondary | ICD-10-CM

## 2015-07-11 MED ORDER — SACUBITRIL-VALSARTAN 97-103 MG PO TABS: 1.0000 | ORAL_TABLET | Freq: Two times a day (BID) | ORAL | Status: DC

## 2015-07-11 NOTE — Addendum Note (Signed)
Addended by: Debbora Lacrosse R on: 07/11/2015 04:05 PM   Modules accepted: Orders, Medications

## 2015-07-11 NOTE — Progress Notes (Signed)
Patient ID: Roy [name], male   DOB: 06-22-1946, 69 y.o.   MRN: [id number]      SUBJECTIVE: The patient presents for routine follow up. He was hospitalized in 2015 for pneumonia with hypoxemic respiratory failure and found to have a non-STEMI. He has a history of tobacco and alcohol abuse, COPD, cirrhosis, cerebrovascular disease with intracerebral stenting, chronic systolic heart failure with an ejection fraction of 20%, chronic kidney disease and peripheral arterial disease. His NSTEMI was medically managed. Troponins peaked at 4.84.   He denies chest pain, shortness of breath, syncope, and leg swelling.  He does have exertional calf claudication. Trying to walk over 1.2 miles daily at home and in [place].  He has had peripheral vascular stenting (bilateral iliac stenting in 04/2014) by Dr. [name].  Nuclear stress testing on 08/30/2014 demonstrated a large area of inferior myocardial scar with no evidence of ischemia, and inferior akinesis.  He has cut back his smoking significantly to a half pack daily on the low end and less than one pack daily on the high end. He quit drinking alcohol after his heart attack and had been drinking a 12 pack of beers daily.  He spoke to me extensively about his family history of heart disease, dementia, and prostate cancer with his father and siblings and is trying to do everything in his power to be healthy so he can provide for his family.   Review of Systems: As per "subjective", otherwise negative.  No Known Allergies  Current Outpatient Prescriptions  Medication Sig Dispense Refill  . amLODipine-benazepril (LOTREL) 10-20 MG per capsule Take 1 capsule by mouth daily. 90 capsule 3  . aspirin EC 81 MG tablet Take 81 mg by mouth every evening.     [name] atorvastatin (LIPITOR) 20 MG tablet Take 20 mg by mouth daily.     . carvedilol (COREG) 3.125 MG tablet TAKE 1 TABLET BY MOUTH TWICE DAILY. 180 tablet 3  . cetirizine (ZYRTEC) 10 MG tablet Take 10 mg  by mouth daily.    . clopidogrel (PLAVIX) 75 MG tablet Take 75 mg by mouth every evening.     . Coenzyme Q10 (CO Q 10) 100 MG CAPS Take 100 mg by mouth every evening.     . cyanocobalamin 2000 MCG tablet Take 2,000 mcg by mouth daily.    . folic acid (FOLVITE) 1 MG tablet Take 1 tablet (1 mg total) by mouth daily.    . furosemide (LASIX) 20 MG tablet Take 20 mg by mouth daily.     [name] gabapentin (NEURONTIN) 100 MG capsule Take 100 mg by mouth 3 (three) times daily.    . Iron-FA-B Cmp-C-Biot-Probiotic (FUSION PLUS) CAPS Take by mouth daily.    . Multiple Vitamins-Minerals (CENTRUM SILVER PO) Take 0.5 tablets by mouth 2 (two) times daily.     . pantoprazole (PROTONIX) 40 MG tablet TAKE ONE TABLET BY MOUTH PRIOR TO MEALS TWICE DAILY FOR 3 MONTHS THENDAILY THEREAFTER. 62 tablet 5  . PARoxetine (PAXIL) 20 MG tablet Take 20 mg by mouth daily.    . potassium chloride SA (K-DUR,KLOR-CON) 20 MEQ tablet Take 20 mEq by mouth daily.    . Prenatal Vit-Fe Fumarate-FA (PRENATAL MULTIVITAMIN) TABS tablet Take 1 tablet by mouth daily at 12 noon.    . pyridOXINE (VITAMIN B-6) 100 MG tablet Take 100 mg by mouth daily.    . sacubitril-valsartan (ENTRESTO) 49-51 MG Take 1 tablet by mouth 2 (two) times daily. 60 tablet 6  . vitamin C (  ASCORBIC ACID) 500 MG tablet Take 500 mg by mouth daily.     No current facility-administered medications for this visit.    Past Medical History  Diagnosis Date  . Cerebrovascular disease 2009    TIA; 2009- right ICA stent; re-intervention for restenosis complicated by [facility] w/o sx  . Degenerative joint disease     Total shoulder arthroplasty-right  . LBBB (left bundle branch block)     Normal echo-2011; stress nuclear in 09/2010--septal hypoperfusion representing nontransmural infarction or the effect of left bundle branch block, no ischemia  . Hyperlipidemia   . Hypertension   . Chronic obstructive pulmonary disease   . Tobacco abuse     -100 pack years; 1.5 packs per day  .  Anxiety and depression   . Alcohol abuse     6 beers per day; hospital admission in 2009 for withdrawal symptoms  . Thrombocytopenia   . Tubular adenoma of colon   . Myocardial infarction June 02, 2014    Massive Heart Attack  . CHF (congestive heart failure) 06-02-14  . Alcoholic cirrhosis   . Peripheral vascular disease     Past Surgical History  Procedure Laterality Date  . Lumbar fusion  2010  . Vasectomy  1971  . Colonoscopy w/ polypectomy  2011  . Total shoulder arthroplasty  2011    Right-Dr. Tamera Punt  . Colonoscopy  08/22/09    Fields-(Tubular Adenoma)3-mm transverse polyp/4-mm polyp otherwise noraml/small internal hemorrhoids  . Esophagogastroduodenoscopy [name] 03/05/2014    Procedure: ESOPHAGOGASTRODUODENOSCOPY (EGD);  Surgeon: Danie Binder, MD;  [facility];  Service: Endoscopy;  Laterality: [name];  11:45  . Agile capsule [name] 03/18/2014    Procedure: AGILE CAPSULE;  Surgeon: Danie Binder, MD;  [facility]: [facility];  Service: Endoscopy;  Laterality: [name];  7:30  . Givens capsule study [name] 03/30/2014    Procedure: GIVENS CAPSULE STUDY;  Surgeon: Danie Binder, MD;  [facility]: [facility];  Service: Endoscopy;  Laterality: [name];  7:30  . Colonoscopy [name] 04/14/2014    [id number] internal hemorrhids/normal mocsa in the terminal iluem/left colonis redundant  . Abdominal aortagram [name] 05/04/2014    Procedure: ABDOMINAL Maxcine Ham;  Surgeon: [name], MD;  Location: [facility] CATH LAB;  Service: Cardiovascular;  Laterality: [name];  . Joint replacement Right   . Spine surgery    . Cataract extraction w/phaco Right 03/08/2015    Procedure: CATARACT EXTRACTION PHACO AND INTRAOCULAR LENS PLACEMENT (IOC);  Surgeon: Rutherford Guys, MD;  Location: [facility];  Service: Ophthalmology;  Laterality: Right;  CDE:9.46  . Cataract extraction w/phaco Left 03/22/2015    Procedure: CATARACT EXTRACTION PHACO AND INTRAOCULAR LENS PLACEMENT (IOC);  Surgeon: Rutherford Guys, MD;  Location: [facility];  Service:  Ophthalmology;  Laterality: Left;  CDE:5.80  . Bacterial overgrowth test [name] 05/24/2015    Procedure: BACTERIAL OVERGROWTH TEST;  Surgeon: Danie Binder, MD;  [facility];  Service: Endoscopy;  Laterality: [name];  0700    Social History   Social History  . Marital Status: Married    Spouse Name: [name]  . Number of Children: 2  . Years of Education: [name]   Occupational History  . 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     Social History Main Topics  . Smoking status: Light Tobacco Smoker -- 1.25 packs/day for 60 years    Types: E-cigarettes    Start date: 08/20/1957  . Smokeless tobacco: Never Used     Comment: 1.5  pks daily  . Alcohol Use: No     Comment: quit in July 2015; No ETOH to date 02/14/15  . Drug Use: No  . Sexual Activity: No   Other Topics Concern  . Not on file   Social History Narrative   Accompanied by daughter [name]   Lives w/ wife, daughter     Danley Danker Vitals:   07/11/15 1515  BP: 124/80  Pulse: 68  Height: 5\' 4"  (1.626 m)  Weight: 160 lb 4.8 oz (72.712 kg)  SpO2: 98%    PHYSICAL EXAM General: NAD HEENT: Normal. Neck: No JVD, no thyromegaly. Lungs: Clear to auscultation bilaterally with normal respiratory effort. CV: Nondisplaced PMI.  Regular rate and rhythm, normal S1/S2, no S3/S4, no murmur. No pretibial or periankle edema.  No carotid bruit.    Abdomen: Soft, nontender, no hepatosplenomegaly, no distention.  Neurologic: Alert and oriented x 3.  Psych: Normal affect. Skin: Normal. Musculoskeletal: Normal range of motion, no gross deformities. Extremities: No clubbing or cyanosis.   ECG: Most recent ECG reviewed.      ASSESSMENT AND PLAN: 1. CAD: Stable ischemic heart disease. Nuclear MPI study results noted above. Continue ASA, Plavix, Coreg, and Lipitor.   2. Chronic systolic heart failure: Euvolemic and stable. Continue carvedilol given severe LV dysfunction. Increase Entresto to 97/103 mg bid, initially started  on 03/03/15. Will obtain echocardiogram to reassess LV function. If LVEF remains severely depressed, will refer to EP for ICD.  3. Tobacco abuse: Has cut back significantly. Appears motivated. Cessation counseling was again provided (10 minutes).  4. PVD: Followed by vascular surgery. Continue ASA and statin. Repeat ABI's in several months. Trying to exercise and quit smoking.  5. Hyperlipidemia: Continue Lipitor 20 mg. AST 17, ALT 12 on 02/14/15.   6. Essential HTN: Well controlled on current therapy. Monitor given increase in Entresto dose.  Dispo: f/u 6 months.  Time spent: 40 minutes, of which greater than 50% was spent reviewing symptoms, relevant blood tests and studies, and discussing management plan with the patient.   Kate Sable, M.D., F.A.C.C.

## 2015-07-11 NOTE — Patient Instructions (Signed)
Your physician wants you to follow-up in: [place]. Bronson Ing. You will receive a reminder letter in the mail two months in advance. If you don't receive a letter, please call our office to schedule the follow-up appointment.  INCREASE ENTRESTO TO 75/103  MG TWO TIMES DAILY  Your physician has requested that you have an echocardiogram. Echocardiography is a painless test that uses sound waves to create images of your heart. It provides your doctor with information about the size and shape of your heart and how well your heart's chambers and valves are working. This procedure takes approximately one hour. There are no restrictions for this procedure.  Thanks for choosing [facility]!!!

## 2015-07-14 ENCOUNTER — Ambulatory Visit (HOSPITAL_COMMUNITY)
Admission: RE | Admit: 2015-07-14 | Discharge: 2015-07-14 | Disposition: A | Discharge status: Home / Self Care | Payer: Medicare Other | Source: Ambulatory Visit | Attending: Cardiovascular Disease | Admitting: Cardiovascular Disease

## 2015-07-14 DIAGNOSIS — Z720 Tobacco use: Secondary | ICD-10-CM | POA: Diagnosis not present

## 2015-07-14 DIAGNOSIS — I255 Ischemic cardiomyopathy: Secondary | ICD-10-CM

## 2015-07-14 DIAGNOSIS — I429 Cardiomyopathy, unspecified: Secondary | ICD-10-CM | POA: Insufficient documentation

## 2015-07-14 DIAGNOSIS — I081 Rheumatic disorders of both mitral and tricuspid valves: Secondary | ICD-10-CM | POA: Insufficient documentation

## 2015-07-15 ENCOUNTER — Telehealth: Payer: Self-pay

## 2015-07-15 DIAGNOSIS — I5020 Unspecified systolic (congestive) heart failure: Secondary | ICD-10-CM

## 2015-07-15 NOTE — Telephone Encounter (Signed)
I spoke with pt and daughter and gave him results of echo.I sent message to and spoke with front desk staff Alphonsus Sias to schedule consult with Dr Lovena Le ,referral placed to [facility]-Dr Lovena Le

## 2015-07-15 NOTE — Telephone Encounter (Signed)
-----   Message from Herminio Commons, MD sent at 07/14/2015  4:32 PM EDT ----- Please make EP referral for ICD. LVEF remains severely reduced.

## 2015-07-18 DIAGNOSIS — D509 Iron deficiency anemia, unspecified: Secondary | ICD-10-CM | POA: Diagnosis not present

## 2015-07-18 DIAGNOSIS — I10 Essential (primary) hypertension: Secondary | ICD-10-CM | POA: Diagnosis not present

## 2015-07-18 DIAGNOSIS — E785 Hyperlipidemia, unspecified: Secondary | ICD-10-CM | POA: Diagnosis not present

## 2015-07-26 DIAGNOSIS — Z961 Presence of intraocular lens: Secondary | ICD-10-CM | POA: Diagnosis not present

## 2015-08-04 ENCOUNTER — Telehealth: Payer: Self-pay | Admitting: Cardiovascular Disease

## 2015-08-04 NOTE — Telephone Encounter (Signed)
Dr Bronson Ing pt

## 2015-08-04 NOTE — Telephone Encounter (Signed)
Pt received a letter from [place] stating they would not be paying for his Roy [name]

## 2015-08-05 NOTE — Telephone Encounter (Signed)
Pt has enough Entresto thru September.he wants what [employer] will pay for and is bringing formulary today and i will speak with Dr Bronson Ing about

## 2015-08-05 NOTE — Telephone Encounter (Signed)
How much does he have left? Do we have any samples at the office. If we cannot get it for him then we will need to consider a change for him, if he has more than a weeks supply I would defer to Dr Raliegh Ip when he gets back   Zandra Abts MD

## 2015-08-05 NOTE — Telephone Encounter (Signed)
I would stop entresto and start lisinopril 20mg  daily  Zandra Abts MD

## 2015-08-05 NOTE — Telephone Encounter (Signed)
[employer] will pay for benazapril ,enalapril, lisinopril, ramipril, losartan, valsartan but not Entresto,pt only wants what insurance will pay for

## 2015-08-08 MED ORDER — LISINOPRIL 20 MG PO TABS: 20.0000 mg | ORAL_TABLET | Freq: Every day | ORAL | Status: DC

## 2015-08-08 NOTE — Telephone Encounter (Signed)
PT made aware, sent in new RX.

## 2015-08-08 NOTE — Addendum Note (Signed)
Addended by: Debbora Lacrosse R on: 08/08/2015 08:15 AM   Modules accepted: Orders, Medications

## 2015-08-11 ENCOUNTER — Other Ambulatory Visit: Payer: Self-pay

## 2015-08-11 DIAGNOSIS — Z23 Encounter for immunization: Secondary | ICD-10-CM | POA: Diagnosis not present

## 2015-08-12 ENCOUNTER — Encounter: Payer: Self-pay | Admitting: Internal Medicine

## 2015-08-12 ENCOUNTER — Ambulatory Visit (INDEPENDENT_AMBULATORY_CARE_PROVIDER_SITE_OTHER): Payer: Medicare Other | Admitting: Internal Medicine

## 2015-08-12 ENCOUNTER — Encounter: Payer: Self-pay | Admitting: *Deleted

## 2015-08-12 VITALS — BP 132/62 | HR 59 | Ht 64.0 in | Wt 164.2 lb

## 2015-08-12 DIAGNOSIS — Z720 Tobacco use: Secondary | ICD-10-CM | POA: Diagnosis not present

## 2015-08-12 DIAGNOSIS — I214 Non-ST elevation (NSTEMI) myocardial infarction: Secondary | ICD-10-CM | POA: Diagnosis not present

## 2015-08-12 DIAGNOSIS — I5022 Chronic systolic (congestive) heart failure: Secondary | ICD-10-CM | POA: Diagnosis not present

## 2015-08-12 DIAGNOSIS — Z01812 Encounter for preprocedural laboratory examination: Secondary | ICD-10-CM

## 2015-08-12 LAB — CBC WITH DIFFERENTIAL/PLATELET
BASOS PCT: 0.3 % (ref 0.0–3.0)
Basophils Absolute: 0 10*3/uL (ref 0.0–0.1)
EOS ABS: 0.8 10*3/uL — AB (ref 0.0–0.7)
Eosinophils Relative: 8.2 % — ABNORMAL HIGH (ref 0.0–5.0)
HCT: 44.8 % (ref 39.0–52.0)
HEMOGLOBIN: 14.8 g/dL (ref 13.0–17.0)
Lymphocytes Relative: 25.2 % (ref 12.0–46.0)
Lymphs Abs: 2.3 10*3/uL (ref 0.7–4.0)
MCHC: 33.2 g/dL (ref 30.0–36.0)
MCV: 92.3 fl (ref 78.0–100.0)
MONO ABS: 0.9 10*3/uL (ref 0.1–1.0)
Monocytes Relative: 9.6 % (ref 3.0–12.0)
NEUTROS PCT: 56.7 % (ref 43.0–77.0)
Neutro Abs: 5.2 10*3/uL (ref 1.4–7.7)
PLATELETS: 280 10*3/uL (ref 150.0–400.0)
RBC: 4.85 Mil/uL (ref 4.22–5.81)
RDW: 13.8 % (ref 11.5–15.5)
WBC: 9.2 10*3/uL (ref 4.0–10.5)

## 2015-08-12 LAB — BASIC METABOLIC PANEL
BUN: 8 mg/dL (ref 6–23)
CALCIUM: 9 mg/dL (ref 8.4–10.5)
CO2: 31 mEq/L (ref 19–32)
CREATININE: 0.65 mg/dL (ref 0.40–1.50)
Chloride: 97 mEq/L (ref 96–112)
GFR: 129.61 mL/min (ref >60.00)
Glucose, Bld: 87 mg/dL (ref 70–99)
Potassium: 4 mEq/L (ref 3.5–5.1)
Sodium: 135 mEq/L (ref 135–145)

## 2015-08-12 NOTE — Progress Notes (Signed)
HPI [name] is referred today to consider ICD implant. He is a pleasant 69 yo man with chronic systolic heart failure, HTN, COPD, and CAD, s/p MI. He has LBBB. The patient has never had syncope. He has class 2 symptoms. He has a QRS duration of 160 ms. He still smokes cigarettes but is trying to cut back and has used ecigs and has reduced his tobacco consumption from 1.5 to 0.5 ppd. He has stopped drinking.  No Known Allergies   Current Outpatient Prescriptions  Medication Sig Dispense Refill  . aspirin EC 81 MG tablet Take 81 mg by mouth every evening.     [name] atorvastatin (LIPITOR) 20 MG tablet Take 20 mg by mouth daily.     . carvedilol (COREG) 3.125 MG tablet TAKE 1 TABLET BY MOUTH TWICE DAILY. 180 tablet 3  . cetirizine (ZYRTEC) 10 MG tablet Take 10 mg by mouth daily.    . clopidogrel (PLAVIX) 75 MG tablet Take 75 mg by mouth every evening.     . Coenzyme Q10 (CO Q 10) 100 MG CAPS Take 100 mg by mouth every evening.     . cyanocobalamin 2000 MCG tablet Take 2,000 mcg by mouth daily.    . folic acid (FOLVITE) 1 MG tablet Take 1 tablet (1 mg total) by mouth daily.    . furosemide (LASIX) 20 MG tablet Take 20 mg by mouth daily.     [name] gabapentin (NEURONTIN) 300 MG capsule Take 300 mg by mouth 3 (three) times daily.    . Iron-FA-B Cmp-C-Biot-Probiotic (FUSION PLUS) CAPS Take by mouth daily.    [name] lisinopril (PRINIVIL,ZESTRIL) 20 MG tablet Take 1 tablet (20 mg total) by mouth daily. 90 tablet 3  . Multiple Vitamins-Minerals (CENTRUM SILVER PO) Take 0.5 tablets by mouth 2 (two) times daily.     . pantoprazole (PROTONIX) 40 MG tablet TAKE ONE TABLET BY MOUTH PRIOR TO MEALS TWICE DAILY FOR 3 MONTHS THENDAILY THEREAFTER. 62 tablet 5  . PARoxetine (PAXIL) 20 MG tablet Take 20 mg by mouth daily.    . potassium chloride SA (K-DUR,KLOR-CON) 20 MEQ tablet Take 20 mEq by mouth daily.    . Prenatal Vit-Fe Fumarate-FA (PRENATAL MULTIVITAMIN) TABS tablet Take 1 tablet by mouth daily at 12 noon.      . pyridOXINE (VITAMIN B-6) 100 MG tablet Take 100 mg by mouth daily.    . vitamin C (ASCORBIC ACID) 500 MG tablet Take 500 mg by mouth daily.     No current facility-administered medications for this visit.     Past Medical History  Diagnosis Date  . Cerebrovascular disease 2009    TIA; 2009- right ICA stent; re-intervention for restenosis complicated by [facility] w/o sx  . Degenerative joint disease     Total shoulder arthroplasty-right  . LBBB (left bundle branch block)     Normal echo-2011; stress nuclear in 09/2010--septal hypoperfusion representing nontransmural infarction or the effect of left bundle branch block, no ischemia  . Hyperlipidemia   . Hypertension   . Chronic obstructive pulmonary disease   . Tobacco abuse     -100 pack years; 1.5 packs per day  . Anxiety and depression   . Alcohol abuse     6 beers per day; hospital admission in 2009 for withdrawal symptoms  . Thrombocytopenia   . Tubular adenoma of colon   . Myocardial infarction June 02, 2014    Massive Heart Attack  . CHF (congestive heart failure) 06-02-14  .  Alcoholic cirrhosis   . Peripheral vascular disease     ROS:   All systems reviewed and negative except as noted in the HPI.   Past Surgical History  Procedure Laterality Date  . Lumbar fusion  2010  . Vasectomy  1971  . Colonoscopy w/ polypectomy  2011  . Total shoulder arthroplasty  2011    Right-Dr. Tamera Punt  . Colonoscopy  08/22/09    Fields-(Tubular Adenoma)3-mm transverse polyp/4-mm polyp otherwise noraml/small internal hemorrhoids  . Esophagogastroduodenoscopy [name] 03/05/2014    SLF: 1. Stricture at the gastroesophagael junction 2. Small hiatal hernia 3. Moderate non-erosive gastritis and duodentitis. 4. No surce for Melena identified.   . Agile capsule [name] 03/18/2014    Procedure: AGILE CAPSULE;  Surgeon: Danie Binder, MD;  [facility];  Service: Endoscopy;  Laterality: [name];  7:30  . Givens capsule study [name] 03/30/2014     Procedure: GIVENS CAPSULE STUDY;  Surgeon: Danie Binder, MD;  [facility];  Service: Endoscopy;  Laterality: [name];  7:30  . Colonoscopy [name] 04/14/2014    NOI:BBCWU internal hemorrhids/normal mocsa in the terminal iluem/left colonis redundant  . Abdominal aortagram [name] 05/04/2014    Procedure: ABDOMINAL Maxcine Ham;  Surgeon: [name], MD;  Location: [facility] CATH LAB;  Service: Cardiovascular;  Laterality: [name];  . Joint replacement Right   . Spine surgery    . Cataract extraction w/phaco Right 03/08/2015    Procedure: CATARACT EXTRACTION PHACO AND INTRAOCULAR LENS PLACEMENT (IOC);  Surgeon: Rutherford Guys, MD;  Location: [facility];  Service: Ophthalmology;  Laterality: Right;  CDE:9.46  . Cataract extraction w/phaco Left 03/22/2015    Procedure: CATARACT EXTRACTION PHACO AND INTRAOCULAR LENS PLACEMENT (IOC);  Surgeon: Rutherford Guys, MD;  Location: [facility];  Service: Ophthalmology;  Laterality: Left;  CDE:5.80  . Bacterial overgrowth test [name] 05/24/2015    Procedure: BACTERIAL OVERGROWTH TEST;  Surgeon: Danie Binder, MD;  [facility];  Service: Endoscopy;  Laterality: [name];  0700     Family History  Problem Relation Age of Onset  . Prostate cancer Brother   . Coronary artery disease Mother   . Diabetes Mother   . Heart disease Mother     Before age 36 - 54 Bypasses  . Hypertension Mother   . Heart attack Mother     3-4 Heart attacks  . Alzheimer's disease Sister   . Alzheimer's disease Father   . Diabetes Father   . Colon cancer Neg Hx   . Heart attack Sister   . Cancer Brother     Prostate  . Hyperlipidemia Son      Social History   Social History  . Marital Status: Married    Spouse Name: [name]  . Number of Children: 2  . Years of Education: [name]   Occupational History  . 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     Social History Main Topics  . Smoking status: Current Every Day Smoker -- 1.25 packs/day for 60 years    Types: E-cigarettes    Start date:  08/20/1957  . Smokeless tobacco: Never Used     Comment: 1.5 pks daily  . Alcohol Use: No     Comment: quit in July 2015; No ETOH to date 02/14/15  . Drug Use: No  . Sexual Activity: No   Other Topics Concern  . Not on file   Social History Narrative   Accompanied by daughter [name]   Lives w/  wife, daughter     BP 132/62 mmHg  Pulse 59  Ht 5\' 4"  (1.626 m)  Wt 164 lb 3.2 oz (74.481 kg)  BMI 28.17 kg/m2  SpO2 96%  Physical Exam:  Well appearing 69yo man, NAD, but diskempts appearing Neck:  7 cm, no thyromegally Lymphatics:  No adenopathy Back:  No CVA tenderness Lungs:  Clear with no wheezes HEART:  Regular rate rhythm, no murmurs, no rubs, no clicks Abd:  soft, positive bowel sounds, no organomegally, no rebound, no guarding Ext:  2 plus pulses, no edema, no cyanosis, no clubbing Skin:  No rashes no nodules Neuro:  CN II through XII intact, motor grossly intact  EKG  - NSR with LBBB   Assess/Plan:

## 2015-08-12 NOTE — Assessment & Plan Note (Signed)
I carefully discussed the importance of smoking cessation. He has tried to transition to [employer].

## 2015-08-12 NOTE — Assessment & Plan Note (Signed)
He is on maximal medical therapy and has at least class 2 symptoms and LBBB. His EF is 35%. I have discussed the treatment options with the patient. The risks/benefits/goals/expectations of BiV ICD implant have been discussed and he wishes to proceed.

## 2015-08-12 NOTE — Patient Instructions (Addendum)
Medication Instructions:  Your physician recommends that you continue on your current medications as directed. Please refer to the Current Medication list given to you today.  Labwork: Pre procedure labs today.  Testing/Procedures: None ordered  Follow-Up: Your wound check is scheduled for 08/29/15 at 12:00 p.m.  at [address].    Any Other Special Instructions Will Be Listed Below (If Applicable). Thank you for choosing [facility]!!

## 2015-08-12 NOTE — Assessment & Plan Note (Signed)
He is s/p MI. He denies anginal symptoms. He is on a good medical regimen.

## 2015-08-17 ENCOUNTER — Ambulatory Visit (INDEPENDENT_AMBULATORY_CARE_PROVIDER_SITE_OTHER): Payer: Medicare Other | Admitting: Nurse Practitioner

## 2015-08-17 ENCOUNTER — Encounter: Payer: Self-pay | Admitting: Nurse Practitioner

## 2015-08-17 VITALS — BP 142/74 | HR 60 | Temp 98.2°F | Ht 64.0 in | Wt 161.2 lb

## 2015-08-17 DIAGNOSIS — I255 Ischemic cardiomyopathy: Secondary | ICD-10-CM | POA: Diagnosis not present

## 2015-08-17 DIAGNOSIS — K7030 Alcoholic cirrhosis of liver without ascites: Secondary | ICD-10-CM

## 2015-08-17 NOTE — Progress Notes (Signed)
CC'D TO PCP °

## 2015-08-17 NOTE — Assessment & Plan Note (Signed)
Well compensated disease. No alcohol in over a year. A symptomatically GI standpoint. Endoscopy up-to-date. Labs due today and have been ordered. Ultrasound to be ordered for 3 months from today. Return for follow-up in 3 months.

## 2015-08-17 NOTE — Patient Instructions (Addendum)
1. Have your labs drawn. 2. We will put in the order for the ultrasound to be done on her after December 21. 3. Return for follow-up in 6 months.

## 2015-08-17 NOTE — Progress Notes (Signed)
Referring Roy [name]: [name], MD Primary Care Physician:  [name], MD Primary GI:  Dr. Oneida Alar  Chief Complaint  Patient presents with  . Cirrhosis    HPI:   69 year old male presents for follow-up on cirrhosis for well compensated disease. Last seen in our office 04/28/2015 with rare RLQ abdominal pain and loose stools. Stool studies negative. Hydrogen breath test ordered to check for SIBO. Recommend follow-up in 4 months. Last liver labs 02/14/2015 which were essentially normal. Last right upper quadrant abdominal ultrasound done nodular liver with hepatic steatosis, no focal lesions. Hydrogen breath test normal with recommendations to avoid foods that trigger loose stools, Kaopectate when necessary. Anoscopy an endoscopy done 03/05/2014 for melena. EGD found gastroesophageal junction stricture, small hiatal hernia, moderate nonerosive gastritis and duodenitis, no source for melena. Recommend continue Protonix. Colonoscopy found normal mucosa and terminal ileum, redundant left colon, no source for melena identified, small internal hemorrhoids recommend high-fiber diet and repeat colonoscopy in 10 years. Stomach biopsies negative for H. Pylori. Givens capsule completed 03/30/2014 found mild NSAID enteropathy.  Today he states his diarrhea has resolved. Abdominal pain has resolved as well. Abdominal U/S up to date. Denies abdominal pain, N/V, yellowing of skin or eyes, hematochezia, melena. Stools are darkened, but takes iron supplementation, confusion. Denies LE edema, abdominal swelling. Denies  dyspnea, dizziness, lightheadedness, syncope, near syncope. Denies any other upper or lower GI symptoms.   Past Medical History  Diagnosis Date  . Cerebrovascular disease 2009    TIA; 2009- right ICA stent; re-intervention for restenosis complicated by [facility] w/o sx  . Degenerative joint disease     Total shoulder arthroplasty-right  . LBBB (left bundle branch block)     Normal echo-2011; stress  nuclear in 09/2010--septal hypoperfusion representing nontransmural infarction or the effect of left bundle branch block, no ischemia  . Hyperlipidemia   . Hypertension   . Chronic obstructive pulmonary disease   . Tobacco abuse     -100 pack years; 1.5 packs per day  . Anxiety and depression   . Alcohol abuse     6 beers per day; hospital admission in 2009 for withdrawal symptoms  . Thrombocytopenia   . Tubular adenoma of colon   . Myocardial infarction June 02, 2014    Massive Heart Attack  . CHF (congestive heart failure) 06-02-14  . Alcoholic cirrhosis   . Peripheral vascular disease     Past Surgical History  Procedure Laterality Date  . Lumbar fusion  2010  . Vasectomy  1971  . Colonoscopy w/ polypectomy  2011  . Total shoulder arthroplasty  2011    Right-Dr. Tamera Punt  . Colonoscopy  08/22/09    Fields-(Tubular Adenoma)3-mm transverse polyp/4-mm polyp otherwise noraml/small internal hemorrhoids  . Esophagogastroduodenoscopy [name] 03/05/2014    SLF: 1. Stricture at the gastroesophagael junction 2. Small hiatal hernia 3. Moderate non-erosive gastritis and duodentitis. 4. No surce for Melena identified.   . Agile capsule [name] 03/18/2014    Procedure: AGILE CAPSULE;  Surgeon: Danie Binder, MD;  [facility];  Service: Endoscopy;  Laterality: [name];  7:30  . Givens capsule study [name] 03/30/2014    Procedure: GIVENS CAPSULE STUDY;  Surgeon: Danie Binder, MD;  [facility]: [facility];  Service: Endoscopy;  Laterality: [name];  7:30  . Colonoscopy [name] 04/14/2014    YIF:OYDXA internal hemorrhids/normal mocsa in the terminal iluem/left colonis redundant  . Abdominal aortagram [name] 05/04/2014    Procedure: ABDOMINAL Maxcine Ham;  Surgeon: [name],  MD;  Location: [place] CATH LAB;  Service: Cardiovascular;  Laterality: [name];  . Joint replacement Right   . Spine surgery    . Cataract extraction w/phaco Right 03/08/2015    Procedure: CATARACT EXTRACTION PHACO AND INTRAOCULAR LENS PLACEMENT  (IOC);  Surgeon: Rutherford Guys, MD;  Location: [facility];  Service: Ophthalmology;  Laterality: Right;  CDE:9.46  . Cataract extraction w/phaco Left 03/22/2015    Procedure: CATARACT EXTRACTION PHACO AND INTRAOCULAR LENS PLACEMENT (IOC);  Surgeon: Rutherford Guys, MD;  Location: [facility];  Service: Ophthalmology;  Laterality: Left;  CDE:5.80  . Bacterial overgrowth test [name] 05/24/2015    Procedure: BACTERIAL OVERGROWTH TEST;  Surgeon: Danie Binder, MD;  [facility]: [facility];  Service: Endoscopy;  Laterality: [name];  0700    Current Outpatient Prescriptions  Medication Sig Dispense Refill  . aspirin EC 81 MG tablet Take 81 mg by mouth every evening.     [name] atorvastatin (LIPITOR) 20 MG tablet Take 20 mg by mouth daily.     . carvedilol (COREG) 3.125 MG tablet TAKE 1 TABLET BY MOUTH TWICE DAILY. 180 tablet 3  . cetirizine (ZYRTEC) 10 MG tablet Take 10 mg by mouth daily.    . clopidogrel (PLAVIX) 75 MG tablet Take 75 mg by mouth every evening.     . Coenzyme Q10 (CO Q 10) 100 MG CAPS Take 100 mg by mouth every evening.     . cyanocobalamin 2000 MCG tablet Take 2,000 mcg by mouth daily.    . folic acid (FOLVITE) 1 MG tablet Take 1 tablet (1 mg total) by mouth daily.    . furosemide (LASIX) 20 MG tablet Take 20 mg by mouth daily.     [name] gabapentin (NEURONTIN) 300 MG capsule Take 300 mg by mouth 3 (three) times daily.    . Iron-FA-B Cmp-C-Biot-Probiotic (FUSION PLUS) CAPS Take by mouth daily.    [name] lisinopril (PRINIVIL,ZESTRIL) 20 MG tablet Take 1 tablet (20 mg total) by mouth daily. 90 tablet 3  . Multiple Vitamins-Minerals (CENTRUM SILVER PO) Take 0.5 tablets by mouth 2 (two) times daily.     . pantoprazole (PROTONIX) 40 MG tablet TAKE ONE TABLET BY MOUTH PRIOR TO MEALS TWICE DAILY FOR 3 MONTHS THENDAILY THEREAFTER. 62 tablet 5  . PARoxetine (PAXIL) 20 MG tablet Take 20 mg by mouth daily.    . potassium chloride SA (K-DUR,KLOR-CON) 20 MEQ tablet Take 20 mEq by mouth daily.    . Prenatal Vit-Fe Fumarate-FA  (PRENATAL MULTIVITAMIN) TABS tablet Take 1 tablet by mouth daily at 12 noon.    . pyridOXINE (VITAMIN B-6) 100 MG tablet Take 100 mg by mouth daily.    . vitamin C (ASCORBIC ACID) 500 MG tablet Take 500 mg by mouth daily.     No current facility-administered medications for this visit.    Allergies as of 08/17/2015  . (No Known Allergies)    Family History  Problem Relation Age of Onset  . Prostate cancer Brother   . Coronary artery disease Mother   . Diabetes Mother   . Heart disease Mother     Before age 93 - 36 Bypasses  . Hypertension Mother   . Heart attack Mother     3-4 Heart attacks  . Alzheimer's disease Sister   . Alzheimer's disease Father   . Diabetes Father   . Colon cancer Neg Hx   . Heart attack Sister   . Cancer Brother     Prostate  . Hyperlipidemia Son  Social History   Social History  . Marital Status: Married    Spouse Name: [name]  . Number of Children: 2  . Years of Education: [name]   Occupational History  . 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     Social History Main Topics  . Smoking status: Current Every Day Smoker -- 1.25 packs/day for 60 years    Types: E-cigarettes    Start date: 08/20/1957  . Smokeless tobacco: Never Used     Comment: 1.5 pks daily  . Alcohol Use: No     Comment: quit in July 2015; No ETOH to date 02/14/15  . Drug Use: No  . Sexual Activity: No   Other Topics Concern  . None   Social History Narrative   Accompanied by daughter [name]   Lives w/ wife, daughter    Review of Systems: 10-point ROS negative except as per HPI  Physical Exam: BP 142/74 mmHg  Pulse 60  Temp(Src) 98.2 F (36.8 C) (Oral)  Ht 5\' 4"  (1.626 m)  Wt 161 lb 3.2 oz (73.12 kg)  BMI 27.66 kg/m2 General:   Alert and oriented. Pleasant and cooperative. Well-nourished and well-developed.  Head:  Normocephalic and atraumatic. Eyes:  Without icterus, sclera clear and conjunctiva pink.  Ears:  Normal auditory  acuity. Cardiovascular:  S1, S2 present without murmurs appreciated. Normal pulses noted. Extremities without clubbing or edema. Respiratory:  Clear to auscultation bilaterally. No wheezes, rales, or rhonchi. No distress.  Gastrointestinal:  +BS, soft, non-tender and non-distended. No HSM noted. No guarding or rebound. No masses appreciated.  Rectal:  Deferred  Skin:  Intact without significant lesions or rashes. Psych:  Alert and cooperative. Normal mood and affect.    08/17/2015 9:00 AM

## 2015-08-19 ENCOUNTER — Encounter (HOSPITAL_COMMUNITY): Payer: Self-pay | Admitting: General Practice

## 2015-08-19 ENCOUNTER — Ambulatory Visit (HOSPITAL_COMMUNITY)
Admission: RE | Admit: 2015-08-19 | Discharge: 2015-08-20 | Disposition: A | Discharge status: Home / Self Care | Payer: Medicare Other | Source: Ambulatory Visit | Attending: Internal Medicine | Admitting: Internal Medicine

## 2015-08-19 ENCOUNTER — Encounter (HOSPITAL_COMMUNITY)
Admission: RE | Disposition: A | Discharge status: Home / Self Care | Payer: Self-pay | Source: Ambulatory Visit | Attending: Internal Medicine

## 2015-08-19 DIAGNOSIS — Z950 Presence of cardiac pacemaker: Secondary | ICD-10-CM

## 2015-08-19 DIAGNOSIS — I739 Peripheral vascular disease, unspecified: Secondary | ICD-10-CM | POA: Insufficient documentation

## 2015-08-19 DIAGNOSIS — Z95810 Presence of automatic (implantable) cardiac defibrillator: Secondary | ICD-10-CM

## 2015-08-19 DIAGNOSIS — Z7982 Long term (current) use of aspirin: Secondary | ICD-10-CM | POA: Diagnosis not present

## 2015-08-19 DIAGNOSIS — I447 Left bundle-branch block, unspecified: Secondary | ICD-10-CM | POA: Diagnosis present

## 2015-08-19 DIAGNOSIS — E785 Hyperlipidemia, unspecified: Secondary | ICD-10-CM | POA: Diagnosis not present

## 2015-08-19 DIAGNOSIS — J449 Chronic obstructive pulmonary disease, unspecified: Secondary | ICD-10-CM | POA: Insufficient documentation

## 2015-08-19 DIAGNOSIS — I255 Ischemic cardiomyopathy: Secondary | ICD-10-CM | POA: Diagnosis not present

## 2015-08-19 DIAGNOSIS — K7030 Alcoholic cirrhosis of liver without ascites: Secondary | ICD-10-CM | POA: Insufficient documentation

## 2015-08-19 DIAGNOSIS — I5022 Chronic systolic (congestive) heart failure: Secondary | ICD-10-CM | POA: Diagnosis present

## 2015-08-19 DIAGNOSIS — Z7902 Long term (current) use of antithrombotics/antiplatelets: Secondary | ICD-10-CM | POA: Diagnosis not present

## 2015-08-19 DIAGNOSIS — I252 Old myocardial infarction: Secondary | ICD-10-CM | POA: Insufficient documentation

## 2015-08-19 DIAGNOSIS — J441 Chronic obstructive pulmonary disease with (acute) exacerbation: Secondary | ICD-10-CM | POA: Diagnosis present

## 2015-08-19 DIAGNOSIS — Z79899 Other long term (current) drug therapy: Secondary | ICD-10-CM | POA: Insufficient documentation

## 2015-08-19 DIAGNOSIS — I2510 Atherosclerotic heart disease of native coronary artery without angina pectoris: Secondary | ICD-10-CM | POA: Insufficient documentation

## 2015-08-19 DIAGNOSIS — F329 Major depressive disorder, single episode, unspecified: Secondary | ICD-10-CM | POA: Insufficient documentation

## 2015-08-19 DIAGNOSIS — I10 Essential (primary) hypertension: Secondary | ICD-10-CM | POA: Insufficient documentation

## 2015-08-19 DIAGNOSIS — F17210 Nicotine dependence, cigarettes, uncomplicated: Secondary | ICD-10-CM | POA: Diagnosis not present

## 2015-08-19 DIAGNOSIS — I119 Hypertensive heart disease without heart failure: Secondary | ICD-10-CM | POA: Diagnosis present

## 2015-08-19 HISTORY — PX: EP IMPLANTABLE DEVICE: SHX172B

## 2015-08-19 HISTORY — PX: BI-VENTRICULAR IMPLANTABLE CARDIOVERTER DEFIBRILLATOR  (CRT-D): SHX5747

## 2015-08-19 HISTORY — DX: Presence of cardiac pacemaker: Z95.0

## 2015-08-19 HISTORY — DX: Presence of automatic (implantable) cardiac defibrillator: Z95.810

## 2015-08-19 LAB — SURGICAL PCR SCREEN
MRSA, PCR: NEGATIVE
STAPHYLOCOCCUS AUREUS: NEGATIVE

## 2015-08-19 SURGERY — BIV ICD INSERTION CRT-D
Anesthesia: LOCAL

## 2015-08-19 MED ORDER — CEFAZOLIN SODIUM-DEXTROSE 2-3 GM-% IV SOLR
INTRAVENOUS | Status: AC
  Filled 2015-08-19: qty 50

## 2015-08-19 MED ORDER — LIDOCAINE HCL (PF) 1 % IJ SOLN
INTRAMUSCULAR | Status: DC | PRN
  Administered 2015-08-19: 50 mL

## 2015-08-19 MED ORDER — SODIUM CHLORIDE 0.9 % IR SOLN
Status: AC
  Filled 2015-08-19: qty 2

## 2015-08-19 MED ORDER — FUSION PLUS PO CAPS: 1.0000 | ORAL_CAPSULE | Freq: Every day | ORAL | Status: DC

## 2015-08-19 MED ORDER — CHLORHEXIDINE GLUCONATE 4 % EX LIQD: 60.0000 mL | Freq: Once | CUTANEOUS | Status: DC

## 2015-08-19 MED ORDER — SODIUM CHLORIDE 0.9 % IV SOLN: 250.0000 mL | INTRAVENOUS | Status: DC | PRN

## 2015-08-19 MED ORDER — MIDAZOLAM HCL 5 MG/5ML IJ SOLN
INTRAMUSCULAR | Status: DC | PRN
  Administered 2015-08-19 (×4): 1 mg via INTRAVENOUS

## 2015-08-19 MED ORDER — VITAMIN B-6 100 MG PO TABS
100.0000 mg | ORAL_TABLET | Freq: Every day | ORAL | Status: DC
  Filled 2015-08-19: qty 1

## 2015-08-19 MED ORDER — FENTANYL CITRATE (PF) 100 MCG/2ML IJ SOLN
INTRAMUSCULAR | Status: DC | PRN
  Administered 2015-08-19: 25 ug via INTRAVENOUS
  Administered 2015-08-19 (×2): 12.5 ug via INTRAVENOUS

## 2015-08-19 MED ORDER — SODIUM CHLORIDE 0.9 % IJ SOLN: 3.0000 mL | INTRAMUSCULAR | Status: DC | PRN

## 2015-08-19 MED ORDER — LIDOCAINE HCL (PF) 1 % IJ SOLN
INTRAMUSCULAR | Status: AC
  Filled 2015-08-19: qty 30

## 2015-08-19 MED ORDER — MIDAZOLAM HCL 5 MG/5ML IJ SOLN
INTRAMUSCULAR | Status: AC
  Filled 2015-08-19: qty 25

## 2015-08-19 MED ORDER — VITAMIN B-12 1000 MCG PO TABS
2000.0000 ug | ORAL_TABLET | Freq: Every day | ORAL | Status: DC
  Filled 2015-08-19: qty 2

## 2015-08-19 MED ORDER — CLOPIDOGREL BISULFATE 75 MG PO TABS: 75.0000 mg | ORAL_TABLET | Freq: Every evening | ORAL | Status: DC

## 2015-08-19 MED ORDER — MUPIROCIN 2 % EX OINT
TOPICAL_OINTMENT | CUTANEOUS | Status: AC
  Administered 2015-08-19: 1 via TOPICAL
  Filled 2015-08-19: qty 22

## 2015-08-19 MED ORDER — GABAPENTIN 300 MG PO CAPS
300.0000 mg | ORAL_CAPSULE | Freq: Three times a day (TID) | ORAL | Status: DC
  Administered 2015-08-19 – 2015-08-20 (×2): 300 mg via ORAL
  Filled 2015-08-19 (×2): qty 1

## 2015-08-19 MED ORDER — ONDANSETRON HCL 4 MG/2ML IJ SOLN: 4.0000 mg | Freq: Four times a day (QID) | INTRAMUSCULAR | Status: DC | PRN

## 2015-08-19 MED ORDER — FOLIC ACID 1 MG PO TABS
1.0000 mg | ORAL_TABLET | Freq: Every day | ORAL | Status: DC
  Administered 2015-08-20: 1 mg via ORAL
  Filled 2015-08-19: qty 1

## 2015-08-19 MED ORDER — SODIUM CHLORIDE 0.9 % IR SOLN: 80.0000 mg | Status: DC

## 2015-08-19 MED ORDER — IOHEXOL 350 MG/ML SOLN
INTRAVENOUS | Status: DC | PRN
  Administered 2015-08-19: 15 mL via INTRAVENOUS

## 2015-08-19 MED ORDER — POTASSIUM CHLORIDE CRYS ER 20 MEQ PO TBCR
20.0000 meq | EXTENDED_RELEASE_TABLET | Freq: Every day | ORAL | Status: DC
  Administered 2015-08-20: 20 meq via ORAL
  Filled 2015-08-19: qty 1

## 2015-08-19 MED ORDER — ACETAMINOPHEN 325 MG PO TABS: 650.0000 mg | ORAL_TABLET | ORAL | Status: DC | PRN

## 2015-08-19 MED ORDER — YOU HAVE A PACEMAKER BOOK
Freq: Once | Status: DC
  Filled 2015-08-19: qty 1

## 2015-08-19 MED ORDER — SODIUM CHLORIDE 0.9 % IJ SOLN: 3.0000 mL | Freq: Two times a day (BID) | INTRAMUSCULAR | Status: DC

## 2015-08-19 MED ORDER — CO Q 10 100 MG PO CAPS: 100.0000 mg | ORAL_CAPSULE | Freq: Every evening | ORAL | Status: DC

## 2015-08-19 MED ORDER — MUPIROCIN 2 % EX OINT
1.0000 "application " | TOPICAL_OINTMENT | Freq: Once | CUTANEOUS | Status: AC
  Administered 2015-08-19: 1 via TOPICAL
  Filled 2015-08-19: qty 22

## 2015-08-19 MED ORDER — SODIUM CHLORIDE 0.9 % IV SOLN
INTRAVENOUS | Status: DC | PRN
  Administered 2015-08-19: 10 mL/h via INTRAVENOUS

## 2015-08-19 MED ORDER — FUROSEMIDE 20 MG PO TABS
20.0000 mg | ORAL_TABLET | Freq: Every day | ORAL | Status: DC
  Administered 2015-08-20: 10:00:00 20 mg via ORAL
  Filled 2015-08-19: qty 1

## 2015-08-19 MED ORDER — CARVEDILOL 3.125 MG PO TABS
3.1250 mg | ORAL_TABLET | Freq: Two times a day (BID) | ORAL | Status: DC
  Administered 2015-08-19 – 2015-08-20 (×2): 3.125 mg via ORAL
  Filled 2015-08-19 (×2): qty 1

## 2015-08-19 MED ORDER — PRENATAL MULTIVITAMIN CH
1.0000 | ORAL_TABLET | Freq: Every day | ORAL | Status: DC
  Filled 2015-08-19: qty 1

## 2015-08-19 MED ORDER — SODIUM CHLORIDE 0.9 % IV SOLN
INTRAVENOUS | Status: DC
  Administered 2015-08-19: 13:00:00 via INTRAVENOUS

## 2015-08-19 MED ORDER — PANTOPRAZOLE SODIUM 20 MG PO TBEC
20.0000 mg | DELAYED_RELEASE_TABLET | Freq: Every day | ORAL | Status: DC
  Administered 2015-08-20: 20 mg via ORAL
  Filled 2015-08-19: qty 1

## 2015-08-19 MED ORDER — PAROXETINE HCL 20 MG PO TABS
20.0000 mg | ORAL_TABLET | Freq: Every day | ORAL | Status: DC
  Filled 2015-08-19: qty 1

## 2015-08-19 MED ORDER — ATORVASTATIN CALCIUM 20 MG PO TABS
20.0000 mg | ORAL_TABLET | Freq: Every day | ORAL | Status: DC
  Administered 2015-08-19: 21:00:00 20 mg via ORAL
  Filled 2015-08-19: qty 1

## 2015-08-19 MED ORDER — CEFAZOLIN SODIUM-DEXTROSE 2-3 GM-% IV SOLR
2.0000 g | INTRAVENOUS | Status: AC
  Administered 2015-08-19: 2 g via INTRAVENOUS

## 2015-08-19 MED ORDER — HEPARIN (PORCINE) IN NACL 2-0.9 UNIT/ML-% IJ SOLN
INTRAMUSCULAR | Status: DC | PRN
  Administered 2015-08-19: 16:00:00

## 2015-08-19 MED ORDER — GENTAMICIN SULFATE 40 MG/ML IJ SOLN
INTRAMUSCULAR | Status: DC | PRN
  Administered 2015-08-19: 500 mL

## 2015-08-19 MED ORDER — LISINOPRIL 10 MG PO TABS
20.0000 mg | ORAL_TABLET | Freq: Every day | ORAL | Status: DC
  Administered 2015-08-20: 10:00:00 20 mg via ORAL
  Filled 2015-08-19: qty 2

## 2015-08-19 MED ORDER — FENTANYL CITRATE (PF) 100 MCG/2ML IJ SOLN
INTRAMUSCULAR | Status: AC
  Filled 2015-08-19: qty 4

## 2015-08-19 MED ORDER — ASPIRIN EC 81 MG PO TBEC: 81.0000 mg | DELAYED_RELEASE_TABLET | Freq: Every evening | ORAL | Status: DC

## 2015-08-19 MED ORDER — VITAMIN C 500 MG PO TABS
500.0000 mg | ORAL_TABLET | Freq: Every day | ORAL | Status: DC
  Administered 2015-08-20: 500 mg via ORAL
  Filled 2015-08-19: qty 1

## 2015-08-19 SURGICAL SUPPLY — 17 items
ASSURA CRTD CD3369-40C (ICD Generator) ×2 IMPLANT
CABLE SURGICAL S-101-97-12 (CABLE) ×1 IMPLANT
CATH CPS DIRECT 135 DS2C020 (CATHETERS) ×1 IMPLANT
CATH HEX JOS 2-5-2 65CM 6F REP (CATHETERS) ×1 IMPLANT
CPS IMPLANT KIT 410190 (MISCELLANEOUS) ×1 IMPLANT
DEFIB ASSURA CRT-D (ICD Generator) IMPLANT
LEAD DURATA 7122-60CM (Lead) ×1 IMPLANT
LEAD QUARTET 1458Q-86CM (Lead) ×1 IMPLANT
LEAD TENDRIL SDX 1688TC-52CM (Lead) ×1 IMPLANT
PAD DEFIB LIFELINK (PAD) ×1 IMPLANT
SHEATH CLASSIC 7F (SHEATH) ×1 IMPLANT
SHEATH CLASSIC 8F (SHEATH) ×1 IMPLANT
SHEATH CLASSIC 9.5F (SHEATH) IMPLANT
SHEATH CLASSIC 9F (SHEATH) IMPLANT
SLITTER UNIVERSAL DS2A003 (MISCELLANEOUS) ×1 IMPLANT
TRAY PACEMAKER INSERTION (CUSTOM PROCEDURE TRAY) ×1 IMPLANT
WIRE ACUITY WHISPER EDS 4648 (WIRE) ×1 IMPLANT

## 2015-08-19 NOTE — Interval H&P Note (Signed)
History and Physical Interval Note:  08/19/2015 4:25 PM  Roy [name]  has presented today for surgery, with the diagnosis of chf  The various methods of treatment have been discussed with the patient and family. After consideration of risks, benefits and other options for treatment, the patient has consented to  Procedure(s): BiV ICD Insertion CRT-D ([name]) as a surgical intervention .  The patient's history has been reviewed, patient examined, no change in status, stable for surgery.  I have reviewed the patient's chart and labs.  Questions were answered to the patient's satisfaction.     Cristopher Peru

## 2015-08-19 NOTE — Progress Notes (Signed)
PHARMACIST - PHYSICIAN ORDER COMMUNICATION  CONCERNING: P&T Medication Policy on Herbal Medications  DESCRIPTION:  This patient's order for:  Co Q-10 and Fiber Fusion Plus caps have been noted.  This product(s) is classified as an "herbal" or natural product. Due to a lack of definitive safety studies or FDA approval, nonstandard manufacturing practices, plus the potential risk of unknown drug-drug interactions while on inpatient medications, the Pharmacy and Therapeutics Committee does not permit the use of "herbal" or natural products of this type within [facility].   ACTION TAKEN: The pharmacy department is unable to verify this order at this time. Please reevaluate patient's clinical condition at discharge and address if the herbal or natural product(s) should be resumed at that time.   Kelvin Cellar, RPh 08/19/2015 6:31 PM

## 2015-08-19 NOTE — H&P (View-Only) (Signed)
HPI [name] is referred today to consider ICD implant. He is a pleasant 69 yo man with chronic systolic heart failure, HTN, COPD, and CAD, s/p MI. He has LBBB. The patient has never had syncope. He has class 2 symptoms. He has a QRS duration of 160 ms. He still smokes cigarettes but is trying to cut back and has used ecigs and has reduced his tobacco consumption from 1.5 to 0.5 ppd. He has stopped drinking.  No Known Allergies   Current Outpatient Prescriptions  Medication Sig Dispense Refill  . aspirin EC 81 MG tablet Take 81 mg by mouth every evening.     [name] atorvastatin (LIPITOR) 20 MG tablet Take 20 mg by mouth daily.     . carvedilol (COREG) 3.125 MG tablet TAKE 1 TABLET BY MOUTH TWICE DAILY. 180 tablet 3  . cetirizine (ZYRTEC) 10 MG tablet Take 10 mg by mouth daily.    . clopidogrel (PLAVIX) 75 MG tablet Take 75 mg by mouth every evening.     . Coenzyme Q10 (CO Q 10) 100 MG CAPS Take 100 mg by mouth every evening.     . cyanocobalamin 2000 MCG tablet Take 2,000 mcg by mouth daily.    . folic acid (FOLVITE) 1 MG tablet Take 1 tablet (1 mg total) by mouth daily.    . furosemide (LASIX) 20 MG tablet Take 20 mg by mouth daily.     [name] gabapentin (NEURONTIN) 300 MG capsule Take 300 mg by mouth 3 (three) times daily.    . Iron-FA-B Cmp-C-Biot-Probiotic (FUSION PLUS) CAPS Take by mouth daily.    [name] lisinopril (PRINIVIL,ZESTRIL) 20 MG tablet Take 1 tablet (20 mg total) by mouth daily. 90 tablet 3  . Multiple Vitamins-Minerals (CENTRUM SILVER PO) Take 0.5 tablets by mouth 2 (two) times daily.     . pantoprazole (PROTONIX) 40 MG tablet TAKE ONE TABLET BY MOUTH PRIOR TO MEALS TWICE DAILY FOR 3 MONTHS THENDAILY THEREAFTER. 62 tablet 5  . PARoxetine (PAXIL) 20 MG tablet Take 20 mg by mouth daily.    . potassium chloride SA (K-DUR,KLOR-CON) 20 MEQ tablet Take 20 mEq by mouth daily.    . Prenatal Vit-Fe Fumarate-FA (PRENATAL MULTIVITAMIN) TABS tablet Take 1 tablet by mouth daily at 12 noon.      . pyridOXINE (VITAMIN B-6) 100 MG tablet Take 100 mg by mouth daily.    . vitamin C (ASCORBIC ACID) 500 MG tablet Take 500 mg by mouth daily.     No current facility-administered medications for this visit.     Past Medical History  Diagnosis Date  . Cerebrovascular disease 2009    TIA; 2009- right ICA stent; re-intervention for restenosis complicated by [facility] w/o sx  . Degenerative joint disease     Total shoulder arthroplasty-right  . LBBB (left bundle branch block)     Normal echo-2011; stress nuclear in 09/2010--septal hypoperfusion representing nontransmural infarction or the effect of left bundle branch block, no ischemia  . Hyperlipidemia   . Hypertension   . Chronic obstructive pulmonary disease   . Tobacco abuse     -100 pack years; 1.5 packs per day  . Anxiety and depression   . Alcohol abuse     6 beers per day; hospital admission in 2009 for withdrawal symptoms  . Thrombocytopenia   . Tubular adenoma of colon   . Myocardial infarction June 02, 2014    Massive Heart Attack  . CHF (congestive heart failure) 06-02-14  .  Alcoholic cirrhosis   . Peripheral vascular disease     ROS:   All systems reviewed and negative except as noted in the HPI.   Past Surgical History  Procedure Laterality Date  . Lumbar fusion  2010  . Vasectomy  1971  . Colonoscopy w/ polypectomy  2011  . Total shoulder arthroplasty  2011    Right-Dr. Tamera Punt  . Colonoscopy  08/22/09    Fields-(Tubular Adenoma)3-mm transverse polyp/4-mm polyp otherwise noraml/small internal hemorrhoids  . Esophagogastroduodenoscopy [name] 03/05/2014    SLF: 1. Stricture at the gastroesophagael junction 2. Small hiatal hernia 3. Moderate non-erosive gastritis and duodentitis. 4. No surce for Melena identified.   . Agile capsule [name] 03/18/2014    Procedure: AGILE CAPSULE;  Surgeon: Danie Binder, MD;  [facility];  Service: Endoscopy;  Laterality: [name];  7:30  . Givens capsule study [name] 03/30/2014     Procedure: GIVENS CAPSULE STUDY;  Surgeon: Danie Binder, MD;  [facility];  Service: Endoscopy;  Laterality: [name];  7:30  . Colonoscopy [name] 04/14/2014    UMP:NTIRW internal hemorrhids/normal mocsa in the terminal iluem/left colonis redundant  . Abdominal aortagram [name] 05/04/2014    Procedure: ABDOMINAL Maxcine Ham;  Surgeon: [name], MD;  Location: [facility] CATH LAB;  Service: Cardiovascular;  Laterality: [name];  . Joint replacement Right   . Spine surgery    . Cataract extraction w/phaco Right 03/08/2015    Procedure: CATARACT EXTRACTION PHACO AND INTRAOCULAR LENS PLACEMENT (IOC);  Surgeon: Rutherford Guys, MD;  Location: [facility];  Service: Ophthalmology;  Laterality: Right;  CDE:9.46  . Cataract extraction w/phaco Left 03/22/2015    Procedure: CATARACT EXTRACTION PHACO AND INTRAOCULAR LENS PLACEMENT (IOC);  Surgeon: Rutherford Guys, MD;  Location: [facility];  Service: Ophthalmology;  Laterality: Left;  CDE:5.80  . Bacterial overgrowth test [name] 05/24/2015    Procedure: BACTERIAL OVERGROWTH TEST;  Surgeon: Danie Binder, MD;  [facility];  Service: Endoscopy;  Laterality: [name];  0700     Family History  Problem Relation Age of Onset  . Prostate cancer Brother   . Coronary artery disease Mother   . Diabetes Mother   . Heart disease Mother     Before age 26 - 49 Bypasses  . Hypertension Mother   . Heart attack Mother     3-4 Heart attacks  . Alzheimer's disease Sister   . Alzheimer's disease Father   . Diabetes Father   . Colon cancer Neg Hx   . Heart attack Sister   . Cancer Brother     Prostate  . Hyperlipidemia Son      Social History   Social History  . Marital Status: Married    Spouse Name: [name]  . Number of Children: 2  . Years of Education: [name]   Occupational History  . 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     Social History Main Topics  . Smoking status: Current Every Day Smoker -- 1.25 packs/day for 60 years    Types: E-cigarettes    Start date:  08/20/1957  . Smokeless tobacco: Never Used     Comment: 1.5 pks daily  . Alcohol Use: No     Comment: quit in July 2015; No ETOH to date 02/14/15  . Drug Use: No  . Sexual Activity: No   Other Topics Concern  . Not on file   Social History Narrative   Accompanied by daughter [name]   Lives w/  wife, daughter     BP 132/62 mmHg  Pulse 59  Ht 5\' 4"  (1.626 m)  Wt 164 lb 3.2 oz (74.481 kg)  BMI 28.17 kg/m2  SpO2 96%  Physical Exam:  Well appearing 69yo man, NAD, but diskempts appearing Neck:  7 cm, no thyromegally Lymphatics:  No adenopathy Back:  No CVA tenderness Lungs:  Clear with no wheezes HEART:  Regular rate rhythm, no murmurs, no rubs, no clicks Abd:  soft, positive bowel sounds, no organomegally, no rebound, no guarding Ext:  2 plus pulses, no edema, no cyanosis, no clubbing Skin:  No rashes no nodules Neuro:  CN II through XII intact, motor grossly intact  EKG  - NSR with LBBB   Assess/Plan:

## 2015-08-20 ENCOUNTER — Ambulatory Visit (HOSPITAL_COMMUNITY): Payer: Medicare Other

## 2015-08-20 ENCOUNTER — Encounter (HOSPITAL_COMMUNITY): Payer: Self-pay | Admitting: Physician Assistant

## 2015-08-20 DIAGNOSIS — J449 Chronic obstructive pulmonary disease, unspecified: Secondary | ICD-10-CM | POA: Diagnosis not present

## 2015-08-20 DIAGNOSIS — I255 Ischemic cardiomyopathy: Secondary | ICD-10-CM

## 2015-08-20 DIAGNOSIS — I5022 Chronic systolic (congestive) heart failure: Secondary | ICD-10-CM | POA: Diagnosis not present

## 2015-08-20 DIAGNOSIS — I10 Essential (primary) hypertension: Secondary | ICD-10-CM | POA: Diagnosis not present

## 2015-08-20 DIAGNOSIS — E785 Hyperlipidemia, unspecified: Secondary | ICD-10-CM | POA: Diagnosis not present

## 2015-08-20 DIAGNOSIS — I447 Left bundle-branch block, unspecified: Secondary | ICD-10-CM | POA: Diagnosis not present

## 2015-08-20 DIAGNOSIS — Z95810 Presence of automatic (implantable) cardiac defibrillator: Secondary | ICD-10-CM | POA: Diagnosis not present

## 2015-08-20 MED ORDER — CEFAZOLIN SODIUM 1-5 GM-% IV SOLN
1.0000 g | Freq: Four times a day (QID) | INTRAVENOUS | Status: DC
  Administered 2015-08-20 (×2): 1 g via INTRAVENOUS
  Filled 2015-08-20 (×3): qty 50

## 2015-08-20 NOTE — Discharge Summary (Signed)
Discharge Summary   Patient ID: Roy [name],  MRN: [id number], DOB/AGE: May 20, 1946 69 y.o.  Admit date: 08/19/2015 Discharge date: 08/20/2015  Primary Care Latayvia Mandujano: Wende Neighbors Primary Cardiologist: Dr. Brett Albino  Discharge Diagnoses Principal Problem:   Cardiomyopathy, ischemic Active Problems:   Hyperlipidemia   Hypertension   LBBB (left bundle branch block)   Chronic systolic heart failure   COPD exacerbation   Allergies No Known Allergies  Procedures  St Jude BiV-ICD for ICM by Dr. Lovena Le 08/19/2015  CONCLUSIONS:  1. ischemic cardiomyopathy with Left bundle-branch block and chronic New York Heart Association class III heart failure.  2. Successful biventricular ICD implantation.  3. DFT less than or equal to 20 joules.  4. No early apparent complications.   St. Jude (serial Number [id number]) biventricular ICD R atrial lead St. Jude (serial # [id number] ) RV lead St. Jude (serial number [id number])  LV lead St.Jude(serial number [id number])     CXR 08/20/2015  IMPRESSION: Interval ICD placement as above. No evidence of acute cardiopulmonary process.    Hospital Course  The patient is a 69 year old male with past medical history of chronic systolic heart failure, hypertension, COPD, chronic LBBB and likely CAD. He was initially seen by cardiology service in July 2015 for NSTEMI, EF at the time was 20%. He was unable to undergo cardiac catheterization due to altered mental status. A Myoview was obtained in October 2015 which showed EF 18%, high risk study, inferior wall akinesis, large degree of inferior myocardium scar, no evidence of ischemia. He was seen by Dr. Lovena Le in the office on 08/12/2015 who recommended BIV ICD.  He underwent a scheduled procedure on 08/19/2015 with successful St. Jude (serial Number [id number]) BiV ICD placement by Dr. Lovena Le. He was kept overnight for observation. The device was interrogated on the following morning. Chest  x-ray showed good lead placement. Device discharge care and instruction was given. He is deemed stable for discharge from cardiology perspective. He will follow-up with wound clinic in one week and follow-up with Dr. Lovena Le in 3 month.  Discharge Vitals Blood pressure 153/57, pulse 60, temperature 97.7 F (36.5 C), temperature source Oral, resp. rate 16, height 5\' 4"  (1.626 m), weight 164 lb 8 oz (74.617 kg), SpO2 95 %.  Filed Weights   08/19/15 1117 08/20/15 0402  Weight: 161 lb (73.029 kg) 164 lb 8 oz (74.617 kg)    Disposition  Pt is being discharged home today in good condition.  Follow-up Plans & Appointments      Follow-up Information    Follow up with [facility] On 08/29/2015.   Specialty:  Cardiology   Why:  Device check in 1 week. 12:00pm   Contact information:   [address], [place] [phone]      Follow up with Cristopher Peru, MD.   Specialty:  Cardiology   Why:  Office will contact you to arrange 3 month followup with Dr. Lonzo Candy information:   [address] [address] [phone]       Discharge Medications    Medication List    TAKE these medications        aspirin EC 81 MG tablet  Take 81 mg by mouth every evening.     atorvastatin 20 MG tablet  Commonly known as:  LIPITOR  Take 20 mg by mouth daily.     carvedilol 3.125 MG tablet  Commonly known as:  COREG  TAKE 1 TABLET BY MOUTH TWICE DAILY.     CENTRUM SILVER PO  Take 0.5 tablets by mouth 2 (two) times daily.     cetirizine 10 MG tablet  Commonly known as:  ZYRTEC  Take 10 mg by mouth daily.     clopidogrel 75 MG tablet  Commonly known as:  PLAVIX  Take 75 mg by mouth every evening.     Co Q 10 100 MG Caps  Take 100 mg by mouth every evening.     cyanocobalamin 2000 MCG tablet  Take 2,000 mcg by mouth daily.     folic acid 1 MG tablet  Commonly known as:  FOLVITE  Take 1  tablet (1 mg total) by mouth daily.     furosemide 20 MG tablet  Commonly known as:  LASIX  Take 20 mg by mouth daily.     FUSION PLUS Caps  Take 1 capsule by mouth daily.     gabapentin 300 MG capsule  Commonly known as:  NEURONTIN  Take 300 mg by mouth 3 (three) times daily.     lisinopril 20 MG tablet  Commonly known as:  PRINIVIL,ZESTRIL  Take 1 tablet (20 mg total) by mouth daily.     pantoprazole 40 MG tablet  Commonly known as:  PROTONIX  TAKE ONE TABLET BY MOUTH PRIOR TO MEALS TWICE DAILY FOR 3 MONTHS THENDAILY THEREAFTER.     PARoxetine 20 MG tablet  Commonly known as:  PAXIL  Take 20 mg by mouth daily.     potassium chloride SA 20 MEQ tablet  Commonly known as:  K-DUR,KLOR-CON  Take 20 mEq by mouth daily.     prenatal multivitamin Tabs tablet  Take 1 tablet by mouth daily at 12 noon.     pyridOXINE 100 MG tablet  Commonly known as:  VITAMIN B-6  Take 100 mg by mouth daily.     vitamin C 500 MG tablet  Commonly known as:  ASCORBIC ACID  Take 500 mg by mouth daily.        Duration of Discharge Encounter   Greater than 30 minutes including physician time.  Hilbert Corrigan PA-C Pager: [phone] 08/20/2015, 9:55 AM  EP Attending  Patient seen and examined. Agree with the findings as noted above. He is stable for discharge home. Usual followup.  Mikle Bosworth.D.

## 2015-08-20 NOTE — Discharge Instructions (Signed)
° ° °  Supplemental Discharge Instructions for  Pacemaker/Defibrillator Patients  Activity No heavy lifting or vigorous activity with your left/right arm for 6 to 8 weeks.  Do not raise your left/right arm above your head for one week.  Gradually raise your affected arm as drawn below.           __9/26                        9/27                          9/28                      9/29  NO DRIVING unless cleared by your cardiologist.  [place] the wound area clean and dry.  Do not get this area wet for one week. No showers for one week; you may shower on 08/27/2015    . - The tape/steri-strips on your wound will fall off; do not pull them off.  No bandage is needed on the site.  DO  NOT apply any creams, oils, or ointments to the wound area. - If you notice any drainage or discharge from the wound, any swelling or bruising at the site, or you develop a fever > 101? F after you are discharged home, call the office at once.  Special Instructions - You are still able to use cellular telephones; use the ear opposite the side where you have your pacemaker/defibrillator.  Avoid carrying your cellular phone near your device. - When traveling through airports, show security personnel your identification card to avoid being screened in the metal detectors.  Ask the security personnel to use the hand wand. - Avoid arc welding equipment, MRI testing (magnetic resonance imaging), TENS units (transcutaneous nerve stimulators).  Call the office for questions about other devices. - Avoid electrical appliances that are in poor condition or are not properly grounded. - Microwave ovens are safe to be near or to operate.  Additional information for defibrillator patients should your device go off: - If your device goes off ONCE and you feel fine afterward, notify the device clinic nurses. - If your device goes off ONCE and you do not feel well afterward, call 911. - If your device goes off TWICE, call  911. - If your device goes off THREE times in one day, call 911.  DO NOT DRIVE YOURSELF OR A FAMILY MEMBER WITH A DEFIBRILLATOR TO THE HOSPITAL--CALL 911.

## 2015-08-20 NOTE — Progress Notes (Signed)
Patient Name: Roy [name] Date of Encounter: 08/20/2015  Primary Cardiologist: Dr. Brett Albino   Active Problems:   Chronic systolic heart failure    SUBJECTIVE  Denies any significant discomfort. Wondering when he can drive. Denies any SOB.   CURRENT MEDS . aspirin EC  81 mg Oral QPM  . atorvastatin  20 mg Oral q1800  . carvedilol  3.125 mg Oral BID  .  ceFAZolin (ANCEF) IV  1 g Intravenous Q6H  . clopidogrel  75 mg Oral QPM  . folic acid  1 mg Oral Daily  . furosemide  20 mg Oral Daily  . gabapentin  300 mg Oral TID  . lisinopril  20 mg Oral Daily  . pantoprazole  20 mg Oral Daily  . PARoxetine  20 mg Oral Daily  . potassium chloride SA  20 mEq Oral Daily  . prenatal multivitamin  1 tablet Oral Q1200  . pyridOXINE  100 mg Oral Daily  . cyanocobalamin  2,000 mcg Oral Daily  . vitamin C  500 mg Oral Daily  . you have a pacemaker book   Does not apply Once    OBJECTIVE  Filed Vitals:   08/19/15 2000 08/19/15 2100 08/20/15 0402 08/20/15 0829  BP: 141/54 145/52 143/60 153/57  Pulse: 60 60 69 60  Temp:   97.2 F (36.2 C) 97.7 F (36.5 C)  TempSrc:   Oral Oral  Resp: 22 21 18 16   Height:      Weight:   164 lb 8 oz (74.617 kg)   SpO2: 95% 94% 93% 95%    Intake/Output Summary (Last 24 hours) at 08/20/15 0848 Last data filed at 08/20/15 0831  Gross per 24 hour  Intake    560 ml  Output   1350 ml  Net   -790 ml   Filed Weights   08/19/15 1117 08/20/15 0402  Weight: 161 lb (73.029 kg) 164 lb 8 oz (74.617 kg)    PHYSICAL EXAM  General: Pleasant, NAD. Neuro: Alert and oriented X 3. Moves all extremities spontaneously. Psych: Normal affect. HEENT:  Normal  Neck: Supple without bruits or JVD. Lungs:  Resp regular and unlabored, CTA. Heart: RRR no s3, s4, or murmurs. L pectoral incision covered with dressing. No bleeding.  Abdomen: Soft, non-tender, non-distended, BS + x 4.  Extremities: No clubbing, cyanosis or edema. DP/PT/Radials 2+ and equal  bilaterally.  Accessory Clinical Findings  TELE Ventricularly paced rhythm    ECG  Paced rhythm with LBBB  Echocardiogram 07/14/2015  LV EF: 35%  ------------------------------------------------------------------- Indications:   Cardiomyopathy - unspecified 425.9.  ------------------------------------------------------------------- History:  Risk factors: Current tobacco use.  ------------------------------------------------------------------- Study Conclusions  - Left ventricle: The cavity size was normal. Wall thickness was normal. Systolic function was moderately to severely reduced. The estimated ejection fraction was 35%. Diffuse hypokinesis. There is akinesis of the inferior myocardium. Doppler parameters are consistent with abnormal left ventricular relaxation (grade 1 diastolic dysfunction). Doppler parameters are consistent with high ventricular filling pressure. - Ventricular septum: Septal motion showed abnormal function and dyssynergy. - Aortic valve: Mildly calcified annulus. Trileaflet; mildly calcified leaflets. - Mitral valve: Mildly thickened leaflets . There was mild regurgitation. - Left atrium: The atrium was mildly dilated. - Right atrium: Central venous pressure (est): 3 mm Hg. - Tricuspid valve: There was trivial regurgitation. - Pulmonary arteries: Systolic pressure could not be accurately estimated. - Pericardium, extracardiac: There was no pericardial effusion.  Impressions:  - Normal LV wall thickness with LVEF approximately  35%, improved somewhat compared to previous study. Diffuse hypokinesis noted with inferior wall akinesis. Septal dyssynergy suggestive of conduction abnormality. Grade 1 diastolic dysfunction with increased filling pressures. Mild left atrial enlargement. Mildly sclerotic aortic valve. Trivial tricuspid regurgitation, unable to assess PASP.    Radiology/Studies  No results  found.  ASSESSMENT AND PLAN  1. Chronic systolic HF with ICM EF 17%  - s/p St. Jude (serial Number [id number]) BiV ICD placement by Dr. Lovena Le  - pending device interrogation and CXR  2. CAD s/p NSTEMI 05/2014, not cathed at the time due to [place], myoview 08/2014 EF 18%, high risk and large degree of inferior myocardial scar, no ischemia  - continue ASA, statin, coreg, plavix, lasix, lisinopril.   3. HTN 4. LBBB 5. COPD  Signed, Woodward Ku Pager: [phone]  Cardiology Attending  Patient seen and examined. Agree with above exam, assessment and plan. He is biv pacing and feels well. [place] for discharge home.  Mikle Bosworth.D.

## 2015-08-22 ENCOUNTER — Encounter (HOSPITAL_COMMUNITY): Payer: Self-pay | Admitting: Internal Medicine

## 2015-08-23 DIAGNOSIS — K7030 Alcoholic cirrhosis of liver without ascites: Secondary | ICD-10-CM | POA: Diagnosis not present

## 2015-08-24 LAB — CBC WITH DIFFERENTIAL/PLATELET
BASOS ABS: 0 10*3/uL (ref 0.0–0.1)
BASOS PCT: 0 % (ref 0–1)
EOS PCT: 6 % — AB (ref 0–5)
Eosinophils Absolute: 0.5 10*3/uL (ref 0.0–0.7)
HCT: 40.7 % (ref 39.0–52.0)
HEMOGLOBIN: 13.8 g/dL (ref 13.0–17.0)
Lymphocytes Relative: 26 % (ref 12–46)
Lymphs Abs: 2.3 10*3/uL (ref 0.7–4.0)
MCH: 30.3 pg (ref 26.0–34.0)
MCHC: 33.9 g/dL (ref 30.0–36.0)
MCV: 89.5 fL (ref 78.0–100.0)
MONO ABS: 0.9 10*3/uL (ref 0.1–1.0)
MPV: 12.1 fL (ref 8.6–12.4)
Monocytes Relative: 10 % (ref 3–12)
NEUTROS ABS: 5.2 10*3/uL (ref 1.7–7.7)
Neutrophils Relative %: 58 % (ref 43–77)
Platelets: 200 10*3/uL (ref 150–400)
RBC: 4.55 MIL/uL (ref 4.22–5.81)
RDW: 13.2 % (ref 11.5–15.5)
WBC: 8.9 10*3/uL (ref 4.0–10.5)

## 2015-08-24 LAB — PROTIME-INR
INR: 0.98 (ref <1.50)
Prothrombin Time: 13.1 seconds (ref 11.6–15.2)

## 2015-08-24 LAB — COMPREHENSIVE METABOLIC PANEL
ALT: 9 U/L (ref 9–46)
AST: 19 U/L (ref 10–35)
Albumin: 4 g/dL (ref 3.6–5.1)
Alkaline Phosphatase: 92 U/L (ref 40–115)
BILIRUBIN TOTAL: 0.5 mg/dL (ref 0.2–1.2)
BUN: 9 mg/dL (ref 7–25)
CO2: 26 mmol/L (ref 20–31)
CREATININE: 0.59 mg/dL — AB (ref 0.70–1.25)
Calcium: 8.5 mg/dL — ABNORMAL LOW (ref 8.6–10.3)
Chloride: 93 mmol/L — ABNORMAL LOW (ref 98–110)
GLUCOSE: 144 mg/dL — AB (ref 65–99)
Potassium: 4.4 mmol/L (ref 3.5–5.3)
SODIUM: 127 mmol/L — AB (ref 135–146)
Total Protein: 6.5 g/dL (ref 6.1–8.1)

## 2015-08-24 LAB — AFP TUMOR MARKER: AFP-Tumor Marker: 1.9 ng/mL (ref <6.1)

## 2015-08-25 NOTE — Progress Notes (Signed)
Labs reviewed, see lab noted.  MELD: 6 Child Pugh: Class A (5 points)

## 2015-08-29 ENCOUNTER — Encounter: Payer: Self-pay | Admitting: Internal Medicine

## 2015-08-29 ENCOUNTER — Ambulatory Visit (INDEPENDENT_AMBULATORY_CARE_PROVIDER_SITE_OTHER): Payer: Medicare Other | Admitting: *Deleted

## 2015-08-29 DIAGNOSIS — I447 Left bundle-branch block, unspecified: Secondary | ICD-10-CM | POA: Diagnosis not present

## 2015-08-29 DIAGNOSIS — I255 Ischemic cardiomyopathy: Secondary | ICD-10-CM

## 2015-08-29 DIAGNOSIS — I5022 Chronic systolic (congestive) heart failure: Secondary | ICD-10-CM

## 2015-08-29 LAB — CUP PACEART INCLINIC DEVICE CHECK
Battery Remaining Longevity: 68.4 mo
Brady Statistic RA Percent Paced: 28 %
HIGH POWER IMPEDANCE MEASURED VALUE: 72 Ohm
Lead Channel Impedance Value: 562.5 Ohm
Lead Channel Pacing Threshold Amplitude: 0.75 V
Lead Channel Pacing Threshold Amplitude: 2.5 V
Lead Channel Pacing Threshold Pulse Width: 0.5 ms
Lead Channel Pacing Threshold Pulse Width: 0.5 ms
Lead Channel Pacing Threshold Pulse Width: 0.5 ms
Lead Channel Pacing Threshold Pulse Width: 0.5 ms
Lead Channel Sensing Intrinsic Amplitude: 12 mV
Lead Channel Setting Pacing Amplitude: 3.5 V
Lead Channel Setting Pacing Pulse Width: 0.5 ms
Lead Channel Setting Sensing Sensitivity: 0.5 mV
MDC IDC MSMT LEADCHNL LV IMPEDANCE VALUE: 825 Ohm
MDC IDC MSMT LEADCHNL LV PACING THRESHOLD AMPLITUDE: 2.375 V
MDC IDC MSMT LEADCHNL RA IMPEDANCE VALUE: 537.5 Ohm
MDC IDC MSMT LEADCHNL RA PACING THRESHOLD AMPLITUDE: 0.75 V
MDC IDC MSMT LEADCHNL RA PACING THRESHOLD PULSEWIDTH: 0.5 ms
MDC IDC MSMT LEADCHNL RA SENSING INTR AMPL: 5 mV
MDC IDC MSMT LEADCHNL RV PACING THRESHOLD AMPLITUDE: 1 V
MDC IDC PG SERIAL: 7294918
MDC IDC SESS DTM: 20161003125302
MDC IDC SET LEADCHNL LV PACING AMPLITUDE: 3.375
MDC IDC SET LEADCHNL RV PACING AMPLITUDE: 2 V
MDC IDC SET LEADCHNL RV PACING PULSEWIDTH: 0.5 ms
MDC IDC STAT BRADY RV PERCENT PACED: 99.13 %
Zone Setting Detection Interval: 270 ms
Zone Setting Detection Interval: 325 ms
Zone Setting Detection Interval: 375 ms

## 2015-08-29 NOTE — Progress Notes (Signed)
CRT-D Wound check appointment. Steri-strips removed. Wound without redness or edema. Incision edges approximated, wound well healed. Normal device function. Thresholds, sensing, and impedances consistent with implant measurements for RA & RV leads. LV threshold elevated---changed vector from M3>>M2 to D1>>M2. Tested w/ pt sitting & recumbent, no diaph.stim. Changed VF NID from 20 to 24. Device programmed at 3.5V for extra safety margin until 3 month visit. Histogram distribution appropriate for patient and level of activity. No mode switches or ventricular arrhythmias noted. Patient educated about wound care, arm mobility, lifting restrictions, shock plan. ROV w/ GT 12/01/15.

## 2015-09-01 ENCOUNTER — Telehealth: Payer: Self-pay

## 2015-09-01 NOTE — Telephone Encounter (Signed)
Pt called office and is aware of his lab results and the recall for his repeat US in 3 months. Pt on recall

## 2015-09-02 ENCOUNTER — Other Ambulatory Visit: Payer: Self-pay

## 2015-09-02 MED ORDER — CARVEDILOL 3.125 MG PO TABS: 3.1250 mg | ORAL_TABLET | Freq: Two times a day (BID) | ORAL | Status: DC

## 2015-09-02 MED ORDER — LISINOPRIL 20 MG PO TABS: 20.0000 mg | ORAL_TABLET | Freq: Every day | ORAL | Status: DC

## 2015-09-02 NOTE — Telephone Encounter (Signed)
Coreg refilled.

## 2015-09-02 NOTE — Telephone Encounter (Signed)
90 day supply requested with 4 refills for [facility].

## 2015-09-02 NOTE — Telephone Encounter (Signed)
Refill complete 

## 2015-09-05 MED ORDER — PANTOPRAZOLE SODIUM 40 MG PO TBEC
40.0000 mg | DELAYED_RELEASE_TABLET | Freq: Every day | ORAL | Status: DC
Start: 2015-09-05 — End: 2015-09-21

## 2015-09-06 ENCOUNTER — Telehealth: Payer: Self-pay

## 2015-09-06 NOTE — Telephone Encounter (Signed)
His insurance company called ans would like to increase him to a 90 days supply of [place]. [phone].You will need to give them this  # [phone]

## 2015-09-07 NOTE — Telephone Encounter (Signed)
OK to call in 90 day supply of protonix 40mg  PO daily, #90 with 3 refills.  Please call. Thanks!

## 2015-09-07 NOTE — Telephone Encounter (Signed)
Cecilie Lowers is aware at the pharmacy

## 2015-09-12 ENCOUNTER — Other Ambulatory Visit: Payer: Self-pay

## 2015-09-21 ENCOUNTER — Other Ambulatory Visit: Payer: Self-pay | Admitting: Gastroenterology

## 2015-09-26 ENCOUNTER — Other Ambulatory Visit: Payer: Self-pay | Admitting: *Deleted

## 2015-09-26 MED ORDER — ENTRESTO 49-51 MG PO TABS: 1.0000 | ORAL_TABLET | Freq: Two times a day (BID) | ORAL | Status: DC

## 2015-10-11 ENCOUNTER — Telehealth: Payer: Self-pay | Admitting: Gastroenterology

## 2015-10-11 NOTE — Telephone Encounter (Signed)
Letter mailed

## 2015-10-11 NOTE — Telephone Encounter (Signed)
DEC RECALL FOR REPEAT ULTRASOUND

## 2015-11-02 ENCOUNTER — Ambulatory Visit (HOSPITAL_COMMUNITY)
Admission: RE | Admit: 2015-11-02 | Discharge: 2015-11-02 | Disposition: A | Discharge status: Home / Self Care | Payer: Medicare Other | Source: Ambulatory Visit | Attending: Nurse Practitioner | Admitting: Nurse Practitioner

## 2015-11-02 DIAGNOSIS — K7030 Alcoholic cirrhosis of liver without ascites: Secondary | ICD-10-CM | POA: Insufficient documentation

## 2015-11-02 DIAGNOSIS — K7460 Unspecified cirrhosis of liver: Secondary | ICD-10-CM | POA: Diagnosis not present

## 2015-11-02 DIAGNOSIS — R932 Abnormal findings on diagnostic imaging of liver and biliary tract: Secondary | ICD-10-CM | POA: Diagnosis not present

## 2015-11-14 ENCOUNTER — Emergency Department (HOSPITAL_COMMUNITY)
Admission: EM | Admit: 2015-11-14 | Discharge: 2015-11-14 | Disposition: A | Discharge status: Home / Self Care | Payer: Medicare Other | Attending: Emergency Medicine | Admitting: Emergency Medicine

## 2015-11-14 ENCOUNTER — Emergency Department (HOSPITAL_COMMUNITY): Payer: Medicare Other

## 2015-11-14 ENCOUNTER — Encounter (HOSPITAL_COMMUNITY): Payer: Self-pay | Admitting: *Deleted

## 2015-11-14 DIAGNOSIS — Z862 Personal history of diseases of the blood and blood-forming organs and certain disorders involving the immune mechanism: Secondary | ICD-10-CM | POA: Insufficient documentation

## 2015-11-14 DIAGNOSIS — W1849XA Other slipping, tripping and stumbling without falling, initial encounter: Secondary | ICD-10-CM | POA: Diagnosis not present

## 2015-11-14 DIAGNOSIS — S93401A Sprain of unspecified ligament of right ankle, initial encounter: Secondary | ICD-10-CM | POA: Diagnosis not present

## 2015-11-14 DIAGNOSIS — Y998 Other external cause status: Secondary | ICD-10-CM | POA: Diagnosis not present

## 2015-11-14 DIAGNOSIS — Z8673 Personal history of transient ischemic attack (TIA), and cerebral infarction without residual deficits: Secondary | ICD-10-CM | POA: Insufficient documentation

## 2015-11-14 DIAGNOSIS — E785 Hyperlipidemia, unspecified: Secondary | ICD-10-CM | POA: Diagnosis not present

## 2015-11-14 DIAGNOSIS — M19011 Primary osteoarthritis, right shoulder: Secondary | ICD-10-CM | POA: Diagnosis not present

## 2015-11-14 DIAGNOSIS — Z86018 Personal history of other benign neoplasm: Secondary | ICD-10-CM | POA: Insufficient documentation

## 2015-11-14 DIAGNOSIS — M79671 Pain in right foot: Secondary | ICD-10-CM | POA: Diagnosis not present

## 2015-11-14 DIAGNOSIS — Z7902 Long term (current) use of antithrombotics/antiplatelets: Secondary | ICD-10-CM | POA: Insufficient documentation

## 2015-11-14 DIAGNOSIS — I509 Heart failure, unspecified: Secondary | ICD-10-CM | POA: Diagnosis not present

## 2015-11-14 DIAGNOSIS — Z95810 Presence of automatic (implantable) cardiac defibrillator: Secondary | ICD-10-CM | POA: Diagnosis not present

## 2015-11-14 DIAGNOSIS — Y9289 Other specified places as the place of occurrence of the external cause: Secondary | ICD-10-CM | POA: Diagnosis not present

## 2015-11-14 DIAGNOSIS — Y9389 Activity, other specified: Secondary | ICD-10-CM | POA: Insufficient documentation

## 2015-11-14 DIAGNOSIS — I252 Old myocardial infarction: Secondary | ICD-10-CM | POA: Insufficient documentation

## 2015-11-14 DIAGNOSIS — I739 Peripheral vascular disease, unspecified: Secondary | ICD-10-CM | POA: Diagnosis not present

## 2015-11-14 DIAGNOSIS — M25571 Pain in right ankle and joints of right foot: Secondary | ICD-10-CM | POA: Diagnosis not present

## 2015-11-14 DIAGNOSIS — S99911A Unspecified injury of right ankle, initial encounter: Secondary | ICD-10-CM | POA: Diagnosis not present

## 2015-11-14 DIAGNOSIS — Z79899 Other long term (current) drug therapy: Secondary | ICD-10-CM | POA: Insufficient documentation

## 2015-11-14 DIAGNOSIS — J449 Chronic obstructive pulmonary disease, unspecified: Secondary | ICD-10-CM | POA: Diagnosis not present

## 2015-11-14 DIAGNOSIS — F17210 Nicotine dependence, cigarettes, uncomplicated: Secondary | ICD-10-CM | POA: Diagnosis not present

## 2015-11-14 DIAGNOSIS — Z7982 Long term (current) use of aspirin: Secondary | ICD-10-CM | POA: Insufficient documentation

## 2015-11-14 DIAGNOSIS — I10 Essential (primary) hypertension: Secondary | ICD-10-CM | POA: Diagnosis not present

## 2015-11-14 MED ORDER — HYDROCODONE-ACETAMINOPHEN 5-325 MG PO TABS: ORAL_TABLET | ORAL | Status: DC

## 2015-11-14 NOTE — ED Provider Notes (Signed)
CSN: [id number]     Arrival date & time 11/14/15  1954 History   First MD Initiated Contact with Patient 11/14/15 2015     Chief Complaint  Patient presents with  . Ankle Pain     (Consider location/radiation/quality/duration/timing/severity/associated sxs/prior Treatment) HPI   GIORDAN GILBERTSON is a 69 y.o. male who presents to the Emergency Department complaining of right ankle pain and swelling for several hours.  He reports a twisting injury to the right ankle that occurred earlier today while stepping out of a doorway.  He states that his "ankle rolled" causing him to fall onto the ground.  He reports pain with weight bearing.  He has wrapped his ankle with an ACE wrap prior to arrival.  He denies numbness or weakness of the extremity, pain proximal to the ankle or other injuries as a result of the fall.  He has not taken any medications for symptom relief.    Past Medical History  Diagnosis Date  . Cerebrovascular disease 2009    TIA; 2009- right ICA stent; re-intervention for restenosis complicated by [facility] w/o sx  . Degenerative joint disease     Total shoulder arthroplasty-right  . LBBB (left bundle branch block)     Normal echo-2011; stress nuclear in 09/2010--septal hypoperfusion representing nontransmural infarction or the effect of left bundle branch block, no ischemia  . Hyperlipidemia   . Hypertension   . Chronic obstructive pulmonary disease ([place])   . Tobacco abuse     -100 pack years; 1.5 packs per day  . Anxiety and depression   . Alcohol abuse     6 beers per day; hospital admission in 2009 for withdrawal symptoms  . Thrombocytopenia ([place])   . Tubular adenoma of colon   . Myocardial infarction [facility]) June 02, 2014    Massive Heart Attack  . CHF (congestive heart failure) ([place]) 06-02-14  . Alcoholic cirrhosis ([place])   . Peripheral vascular disease ([place])   . AICD (automatic cardioverter/defibrillator) present 08/19/2015    St Jude BiV ICD for primary prevention by Dr.  Lovena Le  . 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 Lumbar fusion  2010  . Vasectomy  1971  . Colonoscopy w/ polypectomy  2011  . Total shoulder arthroplasty Right 2011    Dr. Tamera Punt  . Colonoscopy  08/22/09    Fields-(Tubular Adenoma)3-mm transverse polyp/4-mm polyp otherwise noraml/small internal hemorrhoids  . Esophagogastroduodenoscopy [name] 03/05/2014    SLF: 1. Stricture at the gastroesophagael junction 2. Small hiatal hernia 3. Moderate non-erosive gastritis and duodentitis. 4. No surce for Melena identified.   . Agile capsule [name] 03/18/2014    Procedure: AGILE CAPSULE;  Surgeon: Danie Binder, MD;  Location: [facility] ENDO SUITE;  Service: Endoscopy;  Laterality: [name];  7:30  . Givens capsule study [name] 03/30/2014    Procedure: GIVENS CAPSULE STUDY;  Surgeon: Danie Binder, MD;  Location: [facility] ENDO SUITE;  Service: Endoscopy;  Laterality: [name];  7:30  . Colonoscopy [name] 04/14/2014    [id number] internal hemorrhids/normal mocsa in the terminal iluem/left colonis redundant  . Abdominal aortagram [name] 05/04/2014    Procedure: ABDOMINAL Maxcine Ham;  Surgeon: [name], MD;  Location: [facility] CATH LAB;  Service: Cardiovascular;  Laterality: [name];  . Joint replacement Right   . Back surgery    . Cataract extraction w/phaco Right 03/08/2015    Procedure: CATARACT EXTRACTION PHACO AND INTRAOCULAR LENS PLACEMENT (IOC);  Surgeon: Rutherford Guys, MD;  Location: [facility] ORS;  Service: Ophthalmology;  Laterality: Right;  CDE:9.46  . Cataract extraction w/phaco Left 03/22/2015    Procedure: CATARACT EXTRACTION PHACO AND INTRAOCULAR LENS PLACEMENT (IOC);  Surgeon: Rutherford Guys, MD;  Location: [facility] ORS;  Service: Ophthalmology;  Laterality: Left;  CDE:5.80  . Bacterial overgrowth test [name] 05/24/2015    Procedure: BACTERIAL OVERGROWTH TEST;  Surgeon: Danie Binder, MD;  Location: [facility] ENDO SUITE;  Service: Endoscopy;  Laterality: [name];  0700  . Bi-ventricular  implantable cardioverter defibrillator  (crt-d)  08/19/2015  . Ep implantable device [name] 08/19/2015    Procedure: BiV ICD Insertion CRT-D;  Surgeon: Evans Lance, MD;  Location: [facility] CV LAB;  Service: Cardiovascular;  Laterality: [name];   Family History  Problem Relation Age of Onset  . Prostate cancer Brother   . Coronary artery disease Mother   . Diabetes Mother   . Heart disease Mother     Before age 3 - 80 Bypasses  . Hypertension Mother   . Heart attack Mother     3-4 Heart attacks  . Alzheimer's disease Sister   . Alzheimer's disease Father   . Diabetes Father   . Colon cancer Neg Hx   . Heart attack Sister   . Cancer Brother     Prostate  . Hyperlipidemia Son    Social History  Substance Use Topics  . Smoking status: Current Every Day Smoker -- 0.50 packs/day for 60 years    Types: E-cigarettes, Cigarettes    Start date: 08/20/1957  . Smokeless tobacco: Never Used  . Alcohol Use: No     Comment: quit in July 2015; No ETOH to date 02/14/15    Review of Systems  Constitutional: Negative for fever and chills.  Musculoskeletal: Positive for joint swelling and arthralgias (right ankle pain).  Skin: Negative for color change and wound.  Neurological: Negative for weakness and numbness.  All other systems reviewed and are negative.     Allergies  Review of patient's allergies indicates no known allergies.  Home Medications   Prior to Admission medications   Medication Sig Start Date End Date Taking? Authorizing Provider  aspirin EC 81 MG tablet Take 81 mg by mouth every evening.     Historical Provider, MD  atorvastatin (LIPITOR) 20 MG tablet Take 20 mg by mouth daily.     Historical Provider, MD  carvedilol (COREG) 3.125 MG tablet Take 1 tablet (3.125 mg total) by mouth 2 (two) times daily. 09/02/15   Herminio Commons, MD  cetirizine (ZYRTEC) 10 MG tablet Take 10 mg by mouth daily.    Historical Provider, MD  clopidogrel (PLAVIX) 75 MG tablet Take 75 mg by  mouth every evening.     Historical Provider, MD  Coenzyme Q10 (CO Q 10) 100 MG CAPS Take 100 mg by mouth every evening.     Historical Provider, MD  cyanocobalamin 2000 MCG tablet Take 2,000 mcg by mouth daily.    Historical Provider, MD  ENTRESTO 49-51 MG Take 1 tablet by mouth 2 (two) times daily. 09/26/15   Herminio Commons, MD  folic acid (FOLVITE) 1 MG tablet Take 1 tablet (1 mg total) by mouth daily. 06/22/14   Ripudeep Krystal Eaton, MD  furosemide (LASIX) 20 MG tablet Take 20 mg by mouth daily.     Historical Provider, MD  gabapentin (NEURONTIN) 300 MG capsule Take 300 mg by mouth 3 (three) times daily. 06/14/15   Historical Provider, MD  Iron-FA-B Cmp-C-Biot-Probiotic (FUSION PLUS) CAPS  Take 1 capsule by mouth daily.     Historical Provider, MD  Multiple Vitamins-Minerals (CENTRUM SILVER PO) Take 0.5 tablets by mouth 2 (two) times daily.     Historical Provider, MD  pantoprazole (PROTONIX) 40 MG tablet TAKE ONE TABLET BY MOUTH PRIOR TO MEALS TWICE DAILY FOR 3 MONTHS THENDAILY THEREAFTER. 09/23/15   Carlis Stable, NP  PARoxetine (PAXIL) 20 MG tablet Take 20 mg by mouth daily.    Historical Provider, MD  potassium chloride SA (K-DUR,KLOR-CON) 20 MEQ tablet Take 20 mEq by mouth daily.    Historical Provider, MD  Prenatal Vit-Fe Fumarate-FA (PRENATAL MULTIVITAMIN) TABS tablet Take 1 tablet by mouth daily at 12 noon.    Historical Provider, MD  pyridOXINE (VITAMIN B-6) 100 MG tablet Take 100 mg by mouth daily.    Historical Provider, MD  vitamin C (ASCORBIC ACID) 500 MG tablet Take 500 mg by mouth daily.    Historical Provider, MD   BP 143/58 mmHg  Pulse 67  Temp(Src) 97.8 F (36.6 C) (Oral)  Resp 16  Ht 5' 3.5" (1.613 m)  Wt 72.576 kg  BMI 27.89 kg/m2  SpO2 100% Physical Exam  Constitutional: He is oriented to person, place, and time. He appears well-developed and well-nourished. No distress.  HENT:  Head: Normocephalic and atraumatic.  Cardiovascular: Normal rate, regular rhythm, normal  heart sounds and intact distal pulses.   Pulmonary/Chest: Effort normal and breath sounds normal. No respiratory distress.  Musculoskeletal: He exhibits tenderness.  ttp of the lateral right ankle.  Moderate edema.  DP pulse is brisk,distal sensation intact.  No erythema, abrasion, bruising or bony deformity.  No proximal tenderness or edema.  No deformities of the foot or toes.  Neurological: He is alert and oriented to person, place, and time. He exhibits normal muscle tone. Coordination normal.  Skin: Skin is warm and dry.  Nursing note and vitals reviewed.   ED Course  Procedures (including critical care time) Labs Review Labs Reviewed - No data to display  Imaging Review Dg Ankle Complete Right  11/14/2015  CLINICAL DATA:  69 year old male with right ankle trauma and pain. EXAM: RIGHT ANKLE - COMPLETE 3+ VIEW COMPARISON:  None. FINDINGS: There is no evidence of fracture, dislocation, or joint effusion. There is no evidence of arthropathy or other focal bone abnormality. Soft tissues are unremarkable. IMPRESSION: No fracture or dislocation. Electronically Signed   By: Anner Crete M.D.   On: 11/14/2015 20:59   Dg Foot Complete Right  11/14/2015  CLINICAL DATA:  69 year old male with right foot pain after tripping while coming out of the door way EXAM: RIGHT FOOT COMPLETE - 3+ VIEW COMPARISON:  Concurrently obtained radiographs of the right ankle FINDINGS: There is no evidence of fracture or dislocation. There is no evidence of arthropathy or other focal bone abnormality. Soft tissues are unremarkable. Small os peroneus noted incidentally. IMPRESSION: Negative. Electronically Signed   By: Jacqulynn Cadet M.D.   On: 11/14/2015 21:48   I have personally reviewed and evaluated these images and lab results as part of my medical decision-making.    MDM   Final diagnoses:  Ankle sprain, right, initial encounter    Pt also seen by Dr. Roxanne Mins and pt seen back to XR for imaging of the  foot.    XR's reviewed and neg for acute fx.  ASO splint applied.  Pain improved, remains NV intact.  Pt has walker at home agrees to use.  Advised to RICE therapy and vicodin written  for pain, orthopedic f/u.  Pt request referral to Dr. Luna Glasgow.      Kem Parkinson, PA-C [id number]  Delora Fuel, MD [id number]

## 2015-11-14 NOTE — ED Notes (Signed)
Pt states that he tripped coming out of the doorway, twisting right ankle, c/o pain to right ankle, cms intact distal,

## 2015-11-14 NOTE — Discharge Instructions (Signed)
Ankle Sprain °An ankle sprain is an injury to the strong, fibrous tissues (ligaments) that hold your ankle bones together.  °HOME CARE  °· Put ice on your ankle for 1-2 days or as told by your doctor. °¨ Put ice in a plastic bag. °¨ Place a towel between your skin and the bag. °¨ Leave the ice on for 15-20 minutes at a time, every 2 hours while you are awake. °· Only take medicine as told by your doctor. °· Raise (elevate) your injured ankle above the level of your heart as much as possible for 2-3 days. °· Use crutches if your doctor tells you to. Slowly put your own weight on the affected ankle. Use the crutches until you can walk without pain. °· If you have a plaster splint: °¨ Do not rest it on anything harder than a pillow for 24 hours. °¨ Do not put weight on it. °¨ Do not get it wet. °¨ Take it off to shower or bathe. °· If given, use an elastic wrap or support stocking for support. Take the wrap off if your toes lose feeling (numb), tingle, or turn cold or blue. °· If you have an air splint: °¨ Add or let out air to make it comfortable. °¨ Take it off at night and to shower and bathe. °¨ Wiggle your toes and move your ankle up and down often while you are wearing it. °GET HELP IF: °· You have rapidly increasing bruising or puffiness (swelling). °· Your toes feel very cold. °· You lose feeling in your foot. °· Your medicine does not help your pain. °GET HELP RIGHT AWAY IF:  °· Your toes lose feeling (numb) or turn blue. °· You have severe pain that is increasing. °MAKE SURE YOU:  °· Understand these instructions. °· Will watch your condition. °· Will get help right away if you are not doing well or get worse. °  °This information is not intended to replace advice given to you by your health care provider. Make sure you discuss any questions you have with your health care provider. °  °Document Released: 04/30/2008 Document Revised: 12/03/2014 Document Reviewed: 05/26/2012 °Elsevier Interactive Patient  Education ©2016 [employer] ° °

## 2015-11-24 DIAGNOSIS — R05 Cough: Secondary | ICD-10-CM | POA: Diagnosis not present

## 2015-11-24 DIAGNOSIS — I10 Essential (primary) hypertension: Secondary | ICD-10-CM | POA: Diagnosis not present

## 2015-11-24 DIAGNOSIS — J029 Acute pharyngitis, unspecified: Secondary | ICD-10-CM | POA: Diagnosis not present

## 2015-11-24 DIAGNOSIS — M25571 Pain in right ankle and joints of right foot: Secondary | ICD-10-CM | POA: Diagnosis not present

## 2015-11-24 DIAGNOSIS — F17200 Nicotine dependence, unspecified, uncomplicated: Secondary | ICD-10-CM | POA: Diagnosis not present

## 2015-11-24 DIAGNOSIS — Z6828 Body mass index (BMI) 28.0-28.9, adult: Secondary | ICD-10-CM | POA: Diagnosis not present

## 2015-11-24 DIAGNOSIS — M25561 Pain in right knee: Secondary | ICD-10-CM | POA: Diagnosis not present

## 2015-11-24 DIAGNOSIS — J00 Acute nasopharyngitis [common cold]: Secondary | ICD-10-CM | POA: Diagnosis not present

## 2015-12-01 ENCOUNTER — Encounter: Payer: Self-pay | Admitting: Internal Medicine

## 2015-12-01 ENCOUNTER — Ambulatory Visit (INDEPENDENT_AMBULATORY_CARE_PROVIDER_SITE_OTHER): Payer: Medicare Other | Admitting: Internal Medicine

## 2015-12-01 VITALS — BP 124/62 | HR 64 | Ht 63.5 in | Wt 160.0 lb

## 2015-12-01 DIAGNOSIS — I5022 Chronic systolic (congestive) heart failure: Secondary | ICD-10-CM | POA: Diagnosis not present

## 2015-12-01 DIAGNOSIS — Z720 Tobacco use: Secondary | ICD-10-CM | POA: Diagnosis not present

## 2015-12-01 DIAGNOSIS — I110 Hypertensive heart disease with heart failure: Secondary | ICD-10-CM | POA: Diagnosis not present

## 2015-12-01 DIAGNOSIS — Z95810 Presence of automatic (implantable) cardiac defibrillator: Secondary | ICD-10-CM | POA: Diagnosis not present

## 2015-12-01 LAB — CUP PACEART INCLINIC DEVICE CHECK
Battery Remaining Longevity: 76.8
HighPow Impedance: 82 Ohm
Implantable Lead Implant Date: 20160923
Implantable Lead Location: 753860
Implantable Lead Model: 7122
Lead Channel Impedance Value: 725 Ohm
Lead Channel Pacing Threshold Amplitude: 0.75 V
Lead Channel Pacing Threshold Amplitude: 1 V
Lead Channel Pacing Threshold Amplitude: 2.5 V
Lead Channel Pacing Threshold Amplitude: 2.5 V
Lead Channel Pacing Threshold Pulse Width: 0.5 ms
Lead Channel Pacing Threshold Pulse Width: 0.5 ms
Lead Channel Setting Pacing Amplitude: 2 V
Lead Channel Setting Pacing Pulse Width: 0.5 ms
Lead Channel Setting Pacing Pulse Width: 0.5 ms
Lead Channel Setting Sensing Sensitivity: 0.5 mV
MDC IDC LEAD IMPLANT DT: 20160923
MDC IDC LEAD IMPLANT DT: 20160923
MDC IDC LEAD LOCATION: 753858
MDC IDC LEAD LOCATION: 753859
MDC IDC MSMT LEADCHNL LV IMPEDANCE VALUE: 1000 Ohm
MDC IDC MSMT LEADCHNL LV PACING THRESHOLD PULSEWIDTH: 0.5 ms
MDC IDC MSMT LEADCHNL RA IMPEDANCE VALUE: 575 Ohm
MDC IDC MSMT LEADCHNL RA PACING THRESHOLD AMPLITUDE: 0.75 V
MDC IDC MSMT LEADCHNL RA PACING THRESHOLD PULSEWIDTH: 0.5 ms
MDC IDC MSMT LEADCHNL RA SENSING INTR AMPL: 5 mV
MDC IDC MSMT LEADCHNL RV PACING THRESHOLD AMPLITUDE: 1 V
MDC IDC MSMT LEADCHNL RV PACING THRESHOLD PULSEWIDTH: 0.5 ms
MDC IDC MSMT LEADCHNL RV PACING THRESHOLD PULSEWIDTH: 0.5 ms
MDC IDC MSMT LEADCHNL RV SENSING INTR AMPL: 12 mV
MDC IDC SESS DTM: 20170105151626
MDC IDC SET LEADCHNL LV PACING AMPLITUDE: 3.25 V
MDC IDC SET LEADCHNL RV PACING AMPLITUDE: 2 V
MDC IDC STAT BRADY RA PERCENT PACED: 23 %
MDC IDC STAT BRADY RV PERCENT PACED: 99 %
Pulse Gen Serial Number: 7294918

## 2015-12-01 NOTE — Assessment & Plan Note (Signed)
His symptoms remain class 2. He will continue his current meds and maintain a low sodium diet. 

## 2015-12-01 NOTE — Progress Notes (Signed)
HPI Roy [name] is returns today for ICD followup. He is a pleasant 70 yo man with chronic systolic heart failure, HTN, COPD, and CAD, s/p MI. He has LBBB. The patient has never had syncope. He has class 2 symptoms. He has a QRS duration of 160 ms. He underwent BiV ICD implant about 3 months ago. In the interim, he has been stable. Because he is fairly thin, he notes that his device has protruded more than he thought. No Known Allergies   Current Outpatient Prescriptions  Medication Sig Dispense Refill  . aspirin EC 81 MG tablet Take 81 mg by mouth every evening.     [name] atorvastatin (LIPITOR) 20 MG tablet Take 20 mg by mouth daily.     . carvedilol (COREG) 3.125 MG tablet Take 1 tablet (3.125 mg total) by mouth 2 (two) times daily. 180 tablet 3  . cetirizine (ZYRTEC) 10 MG tablet Take 10 mg by mouth daily.    . clopidogrel (PLAVIX) 75 MG tablet Take 75 mg by mouth every evening.     . Coenzyme Q10 (CO Q 10) 100 MG CAPS Take 100 mg by mouth every evening.     . cyanocobalamin 2000 MCG tablet Take 2,000 mcg by mouth daily.    [name] ENTRESTO 49-51 MG Take 1 tablet by mouth 2 (two) times daily. [id number] tablet 3  . folic acid (FOLVITE) 1 MG tablet Take 1 tablet (1 mg total) by mouth daily.    . furosemide (LASIX) 20 MG tablet Take 20 mg by mouth daily.     [name] gabapentin (NEURONTIN) 300 MG capsule Take 300 mg by mouth 3 (three) times daily.    [name] HYDROcodone-acetaminophen (NORCO/VICODIN) 5-325 MG tablet Take one tab po q 4-6 hrs prn pain 15 tablet 0  . Iron-FA-B Cmp-C-Biot-Probiotic (FUSION PLUS) CAPS Take 1 capsule by mouth daily.     . Multiple Vitamins-Minerals (CENTRUM SILVER PO) Take 0.5 tablets by mouth 2 (two) times daily.     . pantoprazole (PROTONIX) 40 MG tablet TAKE ONE TABLET BY MOUTH PRIOR TO MEALS TWICE DAILY FOR 3 MONTHS THENDAILY THEREAFTER. 30 tablet 11  . PARoxetine (PAXIL) 20 MG tablet Take 20 mg by mouth daily.    . potassium chloride SA (K-DUR,KLOR-CON) 20 MEQ tablet Take 20 mEq by  mouth daily.    . Prenatal Vit-Fe Fumarate-FA (PRENATAL MULTIVITAMIN) TABS tablet Take 1 tablet by mouth daily at 12 noon.    . pyridOXINE (VITAMIN B-6) 100 MG tablet Take 100 mg by mouth daily.    . vitamin C (ASCORBIC ACID) 500 MG tablet Take 500 mg by mouth daily.     No current facility-administered medications for this visit.     Past Medical History  Diagnosis Date  . Cerebrovascular disease 2009    TIA; 2009- right ICA stent; re-intervention for restenosis complicated by [facility] w/o sx  . Degenerative joint disease     Total shoulder arthroplasty-right  . LBBB (left bundle branch block)     Normal echo-2011; stress nuclear in 09/2010--septal hypoperfusion representing nontransmural infarction or the effect of left bundle branch block, no ischemia  . Hyperlipidemia   . Hypertension   . Chronic obstructive pulmonary disease ([place])   . Tobacco abuse     -100 pack years; 1.5 packs per day  . Anxiety and depression   . Alcohol abuse     6 beers per day; hospital admission in 2009 for withdrawal symptoms  . Thrombocytopenia ([place])   .  Tubular adenoma of colon   . Myocardial infarction [facility]) June 02, 2014    Massive Heart Attack  . CHF (congestive heart failure) ([place]) 06-02-14  . Alcoholic cirrhosis ([place])   . Peripheral vascular disease ([place])   . AICD (automatic cardioverter/defibrillator) present 08/19/2015    St Jude BiV ICD for primary prevention by Dr. Lovena Le  . 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    ROS:   All systems reviewed and negative except as noted in the HPI.   Past Surgical History  Procedure Laterality Date  . Lumbar fusion  2010  . Vasectomy  1971  . Colonoscopy w/ polypectomy  2011  . Total shoulder arthroplasty Right 2011    Dr. Tamera Punt  . Colonoscopy  08/22/09    Fields-(Tubular Adenoma)3-mm transverse polyp/4-mm polyp otherwise noraml/small internal hemorrhoids  . Esophagogastroduodenoscopy [name] 03/05/2014    SLF: 1. Stricture at the  gastroesophagael junction 2. Small hiatal hernia 3. Moderate non-erosive gastritis and duodentitis. 4. No surce for Melena identified.   . Agile capsule [name] 03/18/2014    Procedure: AGILE CAPSULE;  Surgeon: Danie Binder, MD;  [facility];  Service: Endoscopy;  Laterality: [name];  7:30  . Givens capsule study [name] 03/30/2014    Procedure: GIVENS CAPSULE STUDY;  Surgeon: Danie Binder, MD;  Location: [facility];  Service: Endoscopy;  Laterality: [name];  7:30  . Colonoscopy [name] 04/14/2014    [id number] internal hemorrhids/normal mocsa in the terminal iluem/left colonis redundant  . Abdominal aortagram [name] 05/04/2014    Procedure: ABDOMINAL Maxcine Ham;  Surgeon: [name], MD;  Location: [facility] CATH LAB;  Service: Cardiovascular;  Laterality: [name];  . Joint replacement Right   . Back surgery    . Cataract extraction w/phaco Right 03/08/2015    Procedure: CATARACT EXTRACTION PHACO AND INTRAOCULAR LENS PLACEMENT (IOC);  Surgeon: Rutherford Guys, MD;  Location: [facility] ORS;  Service: Ophthalmology;  Laterality: Right;  CDE:9.46  . Cataract extraction w/phaco Left 03/22/2015    Procedure: CATARACT EXTRACTION PHACO AND INTRAOCULAR LENS PLACEMENT (IOC);  Surgeon: Rutherford Guys, MD;  Location: [facility] ORS;  Service: Ophthalmology;  Laterality: Left;  CDE:5.80  . Bacterial overgrowth test [name] 05/24/2015    Procedure: BACTERIAL OVERGROWTH TEST;  Surgeon: Danie Binder, MD;  Location: [facility];  Service: Endoscopy;  Laterality: [name];  0700  . Bi-ventricular implantable cardioverter defibrillator  (crt-d)  08/19/2015  . Ep implantable device [name] 08/19/2015    Procedure: BiV ICD Insertion CRT-D;  Surgeon: Evans Lance, MD;  Location: [place] CV LAB;  Service: Cardiovascular;  Laterality: [name];     Family History  Problem Relation Age of Onset  . Prostate cancer Brother   . Coronary artery disease Mother   . Diabetes Mother   . Heart disease Mother     Before age 35 - 19 Bypasses  . Hypertension Mother   .  Heart attack Mother     3-4 Heart attacks  . Alzheimer's disease Sister   . Alzheimer's disease Father   . Diabetes Father   . Colon cancer Neg Hx   . Heart attack Sister   . Cancer Brother     Prostate  . Hyperlipidemia Son      Social History   Social History  . Marital Status: Married    Spouse Name: [name]  . Number of Children: 2  . Years of Education: [name]   Occupational History  . 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  Social History Main Topics  . Smoking status: Current Every Day Smoker -- 0.50 packs/day for 60 years    Types: E-cigarettes, Cigarettes    Start date: 08/20/1957  . Smokeless tobacco: Never Used  . Alcohol Use: No     Comment: quit in July 2015; No ETOH to date 02/14/15  . Drug Use: No  . Sexual Activity: No   Other Topics Concern  . Not on file   Social History Narrative   Accompanied by daughter [name]   Lives w/ wife, daughter     BP 124/62 mmHg  Pulse 64  Ht 5' 3.5" (1.613 m)  Wt 160 lb (72.576 kg)  BMI 27.89 kg/m2  Physical Exam:  Well appearing 70yo man, NAD, diskempt but well appearing Neck:  7 cm, no thyromegally Lymphatics:  No adenopathy Back:  No CVA tenderness Lungs:  Clear with no wheezes, well healed ICD incision. HEART:  Regular rate rhythm, no murmurs, no rubs, no clicks Abd:  soft, positive bowel sounds, no organomegally, no rebound, no guarding Ext:  2 plus pulses, no edema, no cyanosis, no clubbing Skin:  No rashes no nodules Neuro:  CN II through XII intact, motor grossly intact  ICD - normal biV ICD function  Assess/Plan:

## 2015-12-01 NOTE — Assessment & Plan Note (Signed)
His St. Jude Biv device is working normally. Will follow.

## 2015-12-01 NOTE — Patient Instructions (Signed)
Medication Instructions:  Your physician recommends that you continue on your current medications as directed. Please refer to the Current Medication list given to you today.   Labwork: None ordered   Testing/Procedures: None ordered   Follow-Up: Your physician wants you to follow-up in: 9 months with Dr Lovena Le in [place] will receive a reminder letter in the mail two months in advance. If you don't receive a letter, please call our office to schedule the follow-up appointment.  Remote monitoring is used to monitor your ICD from home. This monitoring reduces the number of office visits required to check your device to one time per year. It allows [place] to keep an eye on the functioning of your device to ensure it is working properly. You are scheduled for a device check from home on 03/01/16. You may send your transmission at any time that day. If you have a wireless device, the transmission will be sent automatically. After your physician reviews your transmission, you will receive a postcard with your next transmission date.     Any Other Special Instructions Will Be Listed Below (If Applicable).     If you need a refill on your cardiac medications before your next appointment, please call your pharmacy.

## 2015-12-01 NOTE — Assessment & Plan Note (Signed)
I encouraged the patient to stop smoking. Will follow.

## 2015-12-01 NOTE — Assessment & Plan Note (Signed)
His blood pressure is well controlled. No change in meds. I encouraged him to maintain a low sodium diet. 

## 2015-12-09 DIAGNOSIS — M79642 Pain in left hand: Secondary | ICD-10-CM | POA: Diagnosis not present

## 2015-12-09 DIAGNOSIS — I10 Essential (primary) hypertension: Secondary | ICD-10-CM | POA: Diagnosis not present

## 2015-12-09 DIAGNOSIS — F17200 Nicotine dependence, unspecified, uncomplicated: Secondary | ICD-10-CM | POA: Diagnosis not present

## 2015-12-09 DIAGNOSIS — G5602 Carpal tunnel syndrome, left upper limb: Secondary | ICD-10-CM | POA: Diagnosis not present

## 2015-12-09 DIAGNOSIS — Z6828 Body mass index (BMI) 28.0-28.9, adult: Secondary | ICD-10-CM | POA: Diagnosis not present

## 2015-12-09 DIAGNOSIS — M25571 Pain in right ankle and joints of right foot: Secondary | ICD-10-CM | POA: Diagnosis not present

## 2015-12-16 ENCOUNTER — Encounter: Payer: Self-pay | Admitting: Family

## 2015-12-19 ENCOUNTER — Encounter: Payer: Self-pay | Admitting: Family

## 2015-12-19 DIAGNOSIS — M151 Heberden's nodes (with arthropathy): Secondary | ICD-10-CM | POA: Insufficient documentation

## 2015-12-19 DIAGNOSIS — M1812 Unilateral primary osteoarthritis of first carpometacarpal joint, left hand: Secondary | ICD-10-CM | POA: Insufficient documentation

## 2015-12-19 DIAGNOSIS — G5602 Carpal tunnel syndrome, left upper limb: Secondary | ICD-10-CM | POA: Diagnosis not present

## 2015-12-27 ENCOUNTER — Encounter: Payer: Self-pay | Admitting: Family

## 2015-12-27 ENCOUNTER — Ambulatory Visit (HOSPITAL_COMMUNITY)
Admission: RE | Admit: 2015-12-27 | Discharge: 2015-12-27 | Disposition: A | Discharge status: Home / Self Care | Payer: Medicare Other | Source: Ambulatory Visit | Attending: Family | Admitting: Family

## 2015-12-27 ENCOUNTER — Ambulatory Visit (INDEPENDENT_AMBULATORY_CARE_PROVIDER_SITE_OTHER): Payer: Medicare Other | Admitting: Family

## 2015-12-27 ENCOUNTER — Ambulatory Visit (INDEPENDENT_AMBULATORY_CARE_PROVIDER_SITE_OTHER)
Admission: RE | Admit: 2015-12-27 | Discharge: 2015-12-27 | Disposition: A | Discharge status: Home / Self Care | Payer: Medicare Other | Source: Ambulatory Visit | Attending: Family | Admitting: Family

## 2015-12-27 VITALS — BP 138/84 | HR 68 | Ht 63.5 in | Wt 157.5 lb

## 2015-12-27 DIAGNOSIS — F17200 Nicotine dependence, unspecified, uncomplicated: Secondary | ICD-10-CM | POA: Insufficient documentation

## 2015-12-27 DIAGNOSIS — Z789 Other specified health status: Secondary | ICD-10-CM | POA: Insufficient documentation

## 2015-12-27 DIAGNOSIS — I10 Essential (primary) hypertension: Secondary | ICD-10-CM | POA: Diagnosis not present

## 2015-12-27 DIAGNOSIS — Z720 Tobacco use: Secondary | ICD-10-CM

## 2015-12-27 DIAGNOSIS — I70219 Atherosclerosis of native arteries of extremities with intermittent claudication, unspecified extremity: Secondary | ICD-10-CM | POA: Diagnosis not present

## 2015-12-27 DIAGNOSIS — E785 Hyperlipidemia, unspecified: Secondary | ICD-10-CM | POA: Diagnosis not present

## 2015-12-27 DIAGNOSIS — I70213 Atherosclerosis of native arteries of extremities with intermittent claudication, bilateral legs: Secondary | ICD-10-CM

## 2015-12-27 DIAGNOSIS — Z95828 Presence of other vascular implants and grafts: Secondary | ICD-10-CM | POA: Diagnosis not present

## 2015-12-27 NOTE — Patient Instructions (Signed)
Peripheral Vascular Disease Peripheral vascular disease (PVD) is a disease of the blood vessels that are not part of your heart and brain. A simple term for PVD is poor circulation. In most cases, PVD narrows the blood vessels that carry blood from your heart to the rest of your body. This can result in a decreased supply of blood to your arms, legs, and internal organs, like your stomach or kidneys. However, it most often affects a person's lower legs and feet. There are two types of PVD.  Organic PVD. This is the more common type. It is caused by damage to the structure of blood vessels.  Functional PVD. This is caused by conditions that make blood vessels contract and tighten (spasm). Without treatment, PVD tends to get worse over time. PVD can also lead to acute ischemic limb. This is when an arm or limb suddenly has trouble getting enough blood. This is a medical emergency. CAUSES Each type of PVD has many different causes. The most common cause of PVD is buildup of a fatty material (plaque) inside of your arteries (atherosclerosis). Small amounts of plaque can break off from the walls of the blood vessels and become lodged in a smaller artery. This blocks blood flow and can cause acute ischemic limb. Other common causes of PVD include:  Blood clots that form inside of blood vessels.  Injuries to blood vessels.  Diseases that cause inflammation of blood vessels or cause blood vessel spasms.  Health behaviors and health history that increase your risk of developing PVD. RISK FACTORS  You may have a greater risk of PVD if you:  Have a family history of PVD.  Have certain medical conditions, including:  High cholesterol.  Diabetes.  High blood pressure (hypertension).  Coronary heart disease.  Past problems with blood clots.  Past injury, such as burns or a broken bone. These may have damaged blood vessels in your limbs.  Buerger disease. This is caused by inflamed blood  vessels in your hands and feet.  Some forms of arthritis.  Rare birth defects that affect the arteries in your legs.  Use tobacco.  Do not get enough exercise.  Are obese.  Are age 50 or older. SIGNS AND SYMPTOMS  PVD may cause many different symptoms. Your symptoms depend on what part of your body is not getting enough blood. Some common signs and symptoms include:  Cramps in your lower legs. This may be a symptom of poor leg circulation (claudication).  Pain and weakness in your legs while you are physically active that goes away when you rest (intermittent claudication).  Leg pain when at rest.  Leg numbness, tingling, or weakness.  Coldness in a leg or foot, especially when compared with the other leg.  Skin or hair changes. These can include:  Hair loss.  Shiny skin.  Pale or bluish skin.  Thick toenails.  Inability to get or maintain an erection (erectile dysfunction). People with PVD are more prone to developing ulcers and sores on their toes, feet, or legs. These may take longer than normal to heal. DIAGNOSIS Your health care provider may diagnose PVD from your signs and symptoms. The health care provider will also do a physical exam. You may have tests to find out what is causing your PVD and determine its severity. Tests may include:  Blood pressure recordings from your arms and legs and measurements of the strength of your pulses (pulse volume recordings).  Imaging studies using sound waves to take pictures of   the blood flow through your blood vessels (Doppler ultrasound).  Injecting a dye into your blood vessels before having imaging studies using:  X-rays (angiogram or arteriogram).  Computer-generated X-rays (CT angiogram).  A powerful electromagnetic field and a computer (magnetic resonance angiogram or MRA). TREATMENT Treatment for PVD depends on the cause of your condition and the severity of your symptoms. It also depends on your age. Underlying  causes need to be treated and controlled. These include long-lasting (chronic) conditions, such as diabetes, high cholesterol, and high blood pressure. You may need to first try making lifestyle changes and taking medicines. Surgery may be needed if these do not work. Lifestyle changes may include:  Quitting smoking.  Exercising regularly.  Following a low-fat, low-cholesterol diet. Medicines may include:  Blood thinners to prevent blood clots.  Medicines to improve blood flow.  Medicines to improve your blood cholesterol levels. Surgical procedures may include:  A procedure that uses an inflated balloon to open a blocked artery and improve blood flow (angioplasty).  A procedure to put in a tube (stent) to keep a blocked artery open (stent implant).  Surgery to reroute blood flow around a blocked artery (peripheral bypass surgery).  Surgery to remove dead tissue from an infected wound on the affected limb.  Amputation. This is surgical removal of the affected limb. This may be necessary in cases of acute ischemic limb that are not improved through medical or surgical treatments. HOME CARE INSTRUCTIONS  Take medicines only as directed by your health care provider.  Do not use any tobacco products, including cigarettes, chewing tobacco, or electronic cigarettes. If you need help quitting, ask your health care provider.  Lose weight if you are overweight, and maintain a healthy weight as directed by your health care provider.  Eat a diet that is low in fat and cholesterol. If you need help, ask your health care provider.  Exercise regularly. Ask your health care provider to suggest some good activities for you.  Use compression stockings or other mechanical devices as directed by your health care provider.  Take good care of your feet.  Wear comfortable shoes that fit well.  Check your feet often for any cuts or sores. SEEK MEDICAL CARE IF:  You have cramps in your legs  while walking.  You have leg pain when you are at rest.  You have coldness in a leg or foot.  Your skin changes.  You have erectile dysfunction.  You have cuts or sores on your feet that are not healing. SEEK IMMEDIATE MEDICAL CARE IF:  Your arm or leg turns cold and blue.  Your arms or legs become red, warm, swollen, painful, or numb.  You have chest pain or trouble breathing.  You suddenly have weakness in your face, arm, or leg.  You become very confused or lose the ability to speak.  You suddenly have a very bad headache or lose your vision.   This information is not intended to replace advice given to you by your health care provider. Make sure you discuss any questions you have with your health care provider.   Document Released: 12/20/2004 Document Revised: 12/03/2014 Document Reviewed: 04/22/2014 Elsevier Interactive Patient Education 2016 [employer] Inc[employer]     Smoking Cessation, Tips for Success If you are ready to quit smoking, congratulations! You have chosen to help yourself be healthier. Cigarettes bring nicotine, tar, carbon monoxide, and other irritants into your body. Your lungs, heart, and blood vessels will be able to work better without   these poisons. There are many different ways to quit smoking. Nicotine gum, nicotine patches, a nicotine inhaler, or nicotine nasal spray can help with physical craving. Hypnosis, support groups, and medicines help break the habit of smoking. WHAT THINGS CAN I DO TO MAKE QUITTING EASIER?  Here are some tips to help you quit for good:  Pick a date when you will quit smoking completely. Tell all of your friends and family about your plan to quit on that date.  Do not try to slowly cut down on the number of cigarettes you are smoking. Pick a quit date and quit smoking completely starting on that day.  Throw away all cigarettes.   Clean and remove all ashtrays from your home, work, and car.  On a card, write down your reasons  for quitting. Carry the card with you and read it when you get the urge to smoke.  Cleanse your body of nicotine. Drink enough water and fluids to keep your urine clear or pale yellow. Do this after quitting to flush the nicotine from your body.  Learn to predict your moods. Do not let a bad situation be your excuse to have a cigarette. Some situations in your life might tempt you into wanting a cigarette.  Never have "just one" cigarette. It leads to wanting another and another. Remind yourself of your decision to quit.  Change habits associated with smoking. If you smoked while driving or when feeling stressed, try other activities to replace smoking. Stand up when drinking your coffee. Brush your teeth after eating. Sit in a different chair when you read the paper. Avoid alcohol while trying to quit, and try to drink fewer caffeinated beverages. Alcohol and caffeine may urge you to smoke.  Avoid foods and drinks that can trigger a desire to smoke, such as sugary or spicy foods and alcohol.  Ask people who smoke not to smoke around you.  Have something planned to do right after eating or having a cup of coffee. For example, plan to take a walk or exercise.  Try a relaxation exercise to calm you down and decrease your stress. Remember, you may be tense and nervous for the first 2 weeks after you quit, but this will pass.  Find new activities to keep your hands busy. Play with a pen, coin, or rubber band. Doodle or draw things on paper.  Brush your teeth right after eating. This will help cut down on the craving for the taste of tobacco after meals. You can also try mouthwash.   Use oral substitutes in place of cigarettes. Try using lemon drops, carrots, cinnamon sticks, or chewing gum. Keep them handy so they are available when you have the urge to smoke.  When you have the urge to smoke, try deep breathing.  Designate your home as a nonsmoking area.  If you are a heavy smoker, ask your  health care provider about a prescription for nicotine chewing gum. It can ease your withdrawal from nicotine.  Reward yourself. Set aside the cigarette money you save and buy yourself something nice.  Look for support from others. Join a support group or smoking cessation program. Ask someone at home or at work to help you with your plan to quit smoking.  Always ask yourself, "Do I need this cigarette or is this just a reflex?" Tell yourself, "Today, I choose not to smoke," or "I do not want to smoke." You are reminding yourself of your decision to quit.  Do not   cigarette smoking with electronic cigarettes (commonly called e-cigarettes). The safety of e-cigarettes is unknown, and some may contain harmful chemicals.  If you relapse, do not give up! Plan ahead and think about what you will do the next time you get the urge to smoke. HOW WILL I FEEL WHEN I QUIT SMOKING? You may have symptoms of withdrawal because your body is used to nicotine (the addictive substance in cigarettes). You may crave cigarettes, be irritable, feel very hungry, cough often, get headaches, or have difficulty concentrating. The withdrawal symptoms are only temporary. They are strongest when you first quit but will go away within 10-14 days. When withdrawal symptoms occur, stay in control. Think about your reasons for quitting. Remind yourself that these are signs that your body is healing and getting used to being without cigarettes. Remember that withdrawal symptoms are easier to treat than the major diseases that smoking can cause.  Even after the withdrawal is over, expect periodic urges to smoke. However, these cravings are generally short lived and will go away whether you smoke or not. Do not smoke! WHAT RESOURCES ARE AVAILABLE TO HELP ME QUIT SMOKING? Your health care provider can direct you to community resources or hospitals for support, which may include:  Group  support.  Education.  Hypnosis.  Therapy.   This information is not intended to replace advice given to you by your health care provider. Make sure you discuss any questions you have with your health care provider.   Document Released: 08/10/2004 Document Revised: 12/03/2014 Document Reviewed: 04/30/2013 Elsevier Interactive Patient Education 2016 [employer]vier Inc.  

## 2015-12-27 NOTE — Progress Notes (Signed)
Roy [name] HISTORY AND PHYSICAL -PAD  History of Present Illness [name] is a 70 y.o. male patient of Dr. [name] who returns today for followup.He is status post bilateral iliac stenting on 05/04/2014. This was done for lifestyle limiting claudication. He has bilateral calf claudication after walking about 1/4 mile, relieved by rest, he states this is no worse than 6 months ago at his last visit. Pt denies non healing wounds.  He has had a lumbar fusion, states he has low back pain, denies radiculopathy pain. He reports that he has had a TIA in 2010, had a stent placed in his brain in 2009, states Dr. Estanislado Pandy checks [place] of his neck; had an MI July 2015.  He had a pacemaker /ICD placed 08/19/15 by Dr. Lovena Le.  Pt Diabetic: No Pt smoker: smoker (1/2 ppd, started at age 55 yrs)  Pt meds include: Statin :Yes Betablocker: Yes ASA: Yes Other anticoagulants/antiplatelets: Plavix     Past Medical History  Diagnosis Date  . Cerebrovascular disease 2009    TIA; 2009- right ICA stent; re-intervention for restenosis complicated by [facility] w/o sx  . Degenerative joint disease     Total shoulder arthroplasty-right  . LBBB (left bundle branch block)     Normal echo-2011; stress nuclear in 09/2010--septal hypoperfusion representing nontransmural infarction or the effect of left bundle branch block, no ischemia  . Hyperlipidemia   . Hypertension   . Chronic obstructive pulmonary disease ([place])   . Tobacco abuse     -100 pack years; 1.5 packs per day  . Anxiety and depression   . Alcohol abuse     6 beers per day; hospital admission in 2009 for withdrawal symptoms  . Thrombocytopenia ([place])   . Tubular adenoma of colon   . Myocardial infarction [facility]) June 02, 2014    Massive Heart Attack  . CHF (congestive heart failure) ([place]) 06-02-14  . Alcoholic cirrhosis ([place])   . Peripheral Roy disease ([place])   . AICD (automatic cardioverter/defibrillator)  present 08/19/2015    St Jude BiV ICD for primary prevention by Dr. Lovena Le  . 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    Social History Social History  Substance Use Topics  . Smoking status: Current Every Day Smoker -- 0.50 packs/day for 60 years    Types: E-cigarettes, Cigarettes    Start date: 08/20/1957  . Smokeless tobacco: Never Used  . Alcohol Use: No     Comment: quit in July 2015; No ETOH to date 02/14/15    Family History Family History  Problem Relation Age of Onset  . Prostate cancer Brother   . Coronary artery disease Mother   . Diabetes Mother   . Heart disease Mother     Before age 102 - 44 Bypasses  . Hypertension Mother   . Heart attack Mother     3-4 Heart attacks  . Alzheimer's disease Sister   . Alzheimer's disease Father   . Diabetes Father   . Colon cancer Neg Hx   . Heart attack Sister   . Cancer Brother     Prostate  . Hyperlipidemia Son     Past Surgical History  Procedure Laterality Date  . Lumbar fusion  2010  . Vasectomy  1971  . Colonoscopy w/ polypectomy  2011  . Total shoulder arthroplasty Right 2011    Dr. Tamera Punt  . Colonoscopy  08/22/09    Fields-(Tubular Adenoma)3-mm transverse polyp/4-mm polyp otherwise noraml/small internal hemorrhoids  . Esophagogastroduodenoscopy  [name] 03/05/2014    SLF: 1. Stricture at the gastroesophagael junction 2. Small hiatal hernia 3. Moderate non-erosive gastritis and duodentitis. 4. No surce for Melena identified.   . Agile capsule [name] 03/18/2014    Procedure: AGILE CAPSULE;  Surgeon: Danie Binder, MD;  [facility] END[facility];  Service: Endoscopy;  Laterality: [name];  7:30  . Givens capsule study [name] 03/30/2014    Procedure: GIVENS CAPSULE STUDY;  Surgeon: Danie Binder, MD;  Location: [facility];  Service: Endoscopy;  Laterality: [name];  7:30  . Colonoscopy [name] 04/14/2014    [id number] internal hemorrhids/normal mocsa in the terminal iluem/left colonis redundant  . Abdominal aortagram [name] 05/04/2014     Procedure: ABDOMINAL Maxcine Ham;  Surgeon: [name], MD;  Location: [facility] CATH LAB;  Service: Cardiovascular;  Laterality: [name];  . Joint replacement Right   . Back surgery    . Cataract extraction w/phaco Right 03/08/2015    Procedure: CATARACT EXTRACTION PHACO AND INTRAOCULAR LENS PLACEMENT (IOC);  Surgeon: Rutherford Guys, MD;  Location: [facility] ORS;  Service: Ophthalmology;  Laterality: Right;  CDE:9.46  . Cataract extraction w/phaco Left 03/22/2015    Procedure: CATARACT EXTRACTION PHACO AND INTRAOCULAR LENS PLACEMENT (IOC);  Surgeon: Rutherford Guys, MD;  Location: [facility] ORS;  Service: Ophthalmology;  Laterality: Left;  CDE:5.80  . Bacterial overgrowth test [name] 05/24/2015    Procedure: BACTERIAL OVERGROWTH TEST;  Surgeon: Danie Binder, MD;  [facility]: [facility];  Service: Endoscopy;  Laterality: [name];  0700  . Bi-ventricular implantable cardioverter defibrillator  (crt-d)  08/19/2015  . Ep implantable device [name] 08/19/2015    Procedure: BiV ICD Insertion CRT-D;  Surgeon: Evans Lance, MD;  Location: [place] CV LAB;  Service: Cardiovascular;  Laterality: [name];    No Known Allergies  Current Outpatient Prescriptions  Medication Sig Dispense Refill  . aspirin EC 81 MG tablet Take 81 mg by mouth every evening.     [name] atorvastatin (LIPITOR) 20 MG tablet Take 20 mg by mouth daily.     . carvedilol (COREG) 3.125 MG tablet Take 1 tablet (3.125 mg total) by mouth 2 (two) times daily. 180 tablet 3  . cetirizine (ZYRTEC) 10 MG tablet Take 10 mg by mouth daily.    . clopidogrel (PLAVIX) 75 MG tablet Take 75 mg by mouth every evening.     . Coenzyme Q10 (CO Q 10) 100 MG CAPS Take 100 mg by mouth every evening.     . cyanocobalamin 2000 MCG tablet Take 2,000 mcg by mouth daily.    [name] ENTRESTO 49-51 MG Take 1 tablet by mouth 2 (two) times daily. 99991111 tablet 3  . folic acid (FOLVITE) 1 MG tablet Take 1 tablet (1 mg total) by mouth daily.    . furosemide (LASIX) 20 MG tablet Take 20 mg by mouth daily.      [name] gabapentin (NEURONTIN) 300 MG capsule Take 300 mg by mouth 3 (three) times daily.    [name] HYDROcodone-acetaminophen (NORCO/VICODIN) 5-325 MG tablet Take one tab po q 4-6 hrs prn pain 15 tablet 0  . Iron-FA-B Cmp-C-Biot-Probiotic (FUSION PLUS) CAPS Take 1 capsule by mouth daily.     . Multiple Vitamins-Minerals (CENTRUM SILVER PO) Take 0.5 tablets by mouth 2 (two) times daily.     . pantoprazole (PROTONIX) 40 MG tablet TAKE ONE TABLET BY MOUTH PRIOR TO MEALS TWICE DAILY FOR 3 MONTHS THENDAILY THEREAFTER. 30 tablet 11  . PARoxetine (PAXIL) 20 MG tablet Take 20 mg by mouth daily.    [name]  potassium chloride SA (K-DUR,KLOR-CON) 20 MEQ tablet Take 20 mEq by mouth daily.    . Prenatal Vit-Fe Fumarate-FA (PRENATAL MULTIVITAMIN) TABS tablet Take 1 tablet by mouth daily at 12 noon.    . pyridOXINE (VITAMIN B-6) 100 MG tablet Take 100 mg by mouth daily.    . vitamin C (ASCORBIC ACID) 500 MG tablet Take 500 mg by mouth daily.     No current facility-administered medications for this visit.    ROS: See HPI for pertinent positives and negatives.   Physical Examination  Filed Vitals:   12/27/15 1044 12/27/15 1047  BP: 157/82 138/84  Pulse: 68   Height: 5' 3.5" (1.613 m)   Weight: 157 lb 8 oz (71.442 kg)   SpO2: 98%    Body mass index is 27.46 kg/(m^2).  General: A&O x 3, WDWN. Gait: normal Eyes: PERRLA. Pulmonary: CTAB, without wheezes , rales or rhonchi. Cardiac: regular Rythm, without detected murmur.     Carotid Bruits Right Left   Negative Negative  Aorta is not palpable. Radial pulses: are 2+ palpable and =   Roy EXAM: Extremities without ischemic changes without Gangrene; without open wounds.     LE Pulses Right Left   FEMORAL 1+ palpable 1+ palpable    POPLITEAL not  palpable  not palpable   POSTERIOR TIBIAL faintly palpable  not palpable    DORSALIS PEDIS  ANTERIOR TIBIAL 2+ palpable  not palpable    Abdomen: soft, NT, no palpable masses. Skin: no rashes, no ulcers. Musculoskeletal: no muscle wasting or atrophy. Neurologic: A&O X 3; Appropriate Affect, MOTOR FUNCTION: moving all extremities equally, motor strength 5/5 throughout. Speech is fluent/normal. CN 2-12 intact.                Non-Invasive Roy Imaging: DATE: 12/27/2015 ABI (Date: 12/27/2015)  R: 0.90 (0.71, 06/13/15), DP: tri, PT: tri, TBI: 0.83  L: 0.69 (0.63), DP: mono, PT: bi, TBI: 0.57  DUPLEX SCAN OF BYPASS:  Mild disease in right CIA without significant stenosis.  Patent bilateral EIA stents with evidence of >50% stenosis in the right stent exit/outflow (314 cm/s) (compared to 274 cm/s on 06/13/15 at distal stent)    ASSESSMENT: COLTIN HASLEM is a 70 y.o. male who is s/p bilateral external iliac artery stents placed 05/04/2014. He has bilateral calf claudication after walking about 1/4 mile, relieved by rest; he states this is not worse in the last year or so. He has no signs of ischemia in his feet/legs. Today's bilateral iliac artery Duplex suggests mild disease in right CIA without significant stenosis.  Patent bilateral EIA stents with evidence of >50% stenosis in the right stent exit/outflow (314 cm/s) (compared to 274 cm/s on 06/13/15 at distal stent)  ABI's have improved in the right, stable in the left.  He walks a great deal. Unfortunately he continues to smoke but indicates he is receptive to quitting.  PLAN:  The patient was counseled re smoking cessation and given several free resources re smoking cessation.   Based on the patient's Roy studies and examination, pt will return to clinic in 1 year with ABI's.  Continue graduated walking program.  I advised pt to notify us should he develop concerns re the  circulation in his feet.    I discussed in depth with the patient the nature of atherosclerosis, and emphasized the importance of maximal medical management including strict control of blood pressure, blood glucose, and lipid levels, obtaining regular exercise, and cessation of smoking.  The patient  is aware that without maximal medical management the underlying atherosclerotic disease process will progress, limiting the benefit of any interventions.  The patient was given information about PAD including signs, symptoms, treatment, what symptoms should prompt the patient to seek immediate medical care, and risk reduction measures to take.  Clemon Chambers, RN, MSN, FNP-C [facility] [employer] Phone: [phone]  Clinic MD: Early  12/27/2015 10:51 AM

## 2015-12-28 NOTE — Addendum Note (Signed)
Addended by: Thresa Ross C on: 12/28/2015 11:25 AM   Modules accepted: Orders

## 2015-12-30 DIAGNOSIS — Z23 Encounter for immunization: Secondary | ICD-10-CM | POA: Diagnosis not present

## 2016-01-12 DIAGNOSIS — G5603 Carpal tunnel syndrome, bilateral upper limbs: Secondary | ICD-10-CM | POA: Diagnosis not present

## 2016-01-12 DIAGNOSIS — G6289 Other specified polyneuropathies: Secondary | ICD-10-CM | POA: Diagnosis not present

## 2016-01-13 DIAGNOSIS — S60221D Contusion of right hand, subsequent encounter: Secondary | ICD-10-CM | POA: Diagnosis not present

## 2016-01-13 DIAGNOSIS — R202 Paresthesia of skin: Secondary | ICD-10-CM | POA: Diagnosis not present

## 2016-01-13 DIAGNOSIS — G5622 Lesion of ulnar nerve, left upper limb: Secondary | ICD-10-CM | POA: Insufficient documentation

## 2016-01-17 ENCOUNTER — Other Ambulatory Visit: Payer: Self-pay | Admitting: Orthopedic Surgery

## 2016-01-17 ENCOUNTER — Encounter: Payer: Self-pay | Admitting: Cardiovascular Disease

## 2016-01-17 ENCOUNTER — Ambulatory Visit (INDEPENDENT_AMBULATORY_CARE_PROVIDER_SITE_OTHER): Payer: Medicare Other | Admitting: Cardiovascular Disease

## 2016-01-17 VITALS — BP 128/78 | HR 73 | Ht 64.0 in | Wt 157.0 lb

## 2016-01-17 DIAGNOSIS — I70213 Atherosclerosis of native arteries of extremities with intermittent claudication, bilateral legs: Secondary | ICD-10-CM

## 2016-01-17 DIAGNOSIS — I10 Essential (primary) hypertension: Secondary | ICD-10-CM | POA: Diagnosis not present

## 2016-01-17 DIAGNOSIS — I252 Old myocardial infarction: Secondary | ICD-10-CM | POA: Diagnosis not present

## 2016-01-17 DIAGNOSIS — I5022 Chronic systolic (congestive) heart failure: Secondary | ICD-10-CM

## 2016-01-17 DIAGNOSIS — E785 Hyperlipidemia, unspecified: Secondary | ICD-10-CM

## 2016-01-17 DIAGNOSIS — I25118 Atherosclerotic heart disease of native coronary artery with other forms of angina pectoris: Secondary | ICD-10-CM | POA: Diagnosis not present

## 2016-01-17 DIAGNOSIS — Z720 Tobacco use: Secondary | ICD-10-CM

## 2016-01-17 DIAGNOSIS — I739 Peripheral vascular disease, unspecified: Secondary | ICD-10-CM

## 2016-01-17 DIAGNOSIS — Z950 Presence of cardiac pacemaker: Secondary | ICD-10-CM

## 2016-01-17 DIAGNOSIS — I519 Heart disease, unspecified: Secondary | ICD-10-CM

## 2016-01-17 NOTE — Patient Instructions (Signed)
Your physician wants you to follow-up in:  1 year with Dr Koneswaran You will receive a reminder letter in the mail two months in advance. If you don't receive a letter, please call our office to schedule the follow-up appointment.    Your physician recommends that you continue on your current medications as directed. Please refer to the Current Medication list given to you today.    If you need a refill on your cardiac medications before your next appointment, please call your pharmacy.     Thank you for choosing [facility] !        

## 2016-01-17 NOTE — Progress Notes (Signed)
Patient ID: Roy [name], male   DOB: July 24, 1946, 70 y.o.   MRN: [id number]      SUBJECTIVE: The patient presents for routine follow up. He was hospitalized in 2015 for pneumonia with hypoxemic respiratory failure and found to have a non-STEMI. He has a history of tobacco and alcohol abuse, COPD, cirrhosis, cerebrovascular disease with intracerebral stenting, chronic systolic heart failure with an ejection fraction of 35% (07/14/15 echo) s/p BiV ICD (followed by Dr. Lovena Le), chronic kidney disease and peripheral arterial disease. His NSTEMI was medically managed. Troponins peaked at 4.84.   He denies chest pain, shortness of breath, syncope, and leg swelling.  He does have exertional calf claudication but this has improved with increased walking. He has been walking 2000 steps daily and wants to increase this to 3000 steps daily.  He has had peripheral vascular stenting (bilateral iliac stenting in 04/2014) by Dr. [name].  Nuclear stress testing on 08/30/2014 demonstrated a large area of inferior myocardial scar with no evidence of ischemia, and inferior akinesis.  He continues to smoke.   Review of Systems: As per "subjective", otherwise negative.  No Known Allergies  Current Outpatient Prescriptions  Medication Sig Dispense Refill  . aspirin EC 81 MG tablet Take 81 mg by mouth every evening.     [name] atorvastatin (LIPITOR) 20 MG tablet Take 20 mg by mouth daily.     . carvedilol (COREG) 3.125 MG tablet Take 1 tablet (3.125 mg total) by mouth 2 (two) times daily. 180 tablet 3  . cetirizine (ZYRTEC) 10 MG tablet Take 10 mg by mouth daily.    . clopidogrel (PLAVIX) 75 MG tablet Take 75 mg by mouth every evening.     . cyanocobalamin 2000 MCG tablet Take 2,000 mcg by mouth daily.    [name] ENTRESTO 49-51 MG Take 1 tablet by mouth 2 (two) times daily. 99991111 tablet 3  . folic acid (FOLVITE) 1 MG tablet Take 1 tablet (1 mg total) by mouth daily.    . furosemide (LASIX) 20 MG tablet Take 20 mg by  mouth daily.     [name] gabapentin (NEURONTIN) 300 MG capsule Take 300 mg by mouth 3 (three) times daily.    [name] HYDROcodone-acetaminophen (NORCO/VICODIN) 5-325 MG tablet Take one tab po q 4-6 hrs prn pain 15 tablet 0  . Iron-FA-B Cmp-C-Biot-Probiotic (FUSION PLUS) CAPS Take 1 capsule by mouth daily.     . Multiple Vitamins-Minerals (CENTRUM SILVER PO) Take 0.5 tablets by mouth 2 (two) times daily.     . pantoprazole (PROTONIX) 40 MG tablet TAKE ONE TABLET BY MOUTH PRIOR TO MEALS TWICE DAILY FOR 3 MONTHS THENDAILY THEREAFTER. 30 tablet 11  . PARoxetine (PAXIL) 20 MG tablet Take 20 mg by mouth daily.    . potassium chloride SA (K-DUR,KLOR-CON) 20 MEQ tablet Take 20 mEq by mouth daily.    . Prenatal Vit-Fe Fumarate-FA (PRENATAL MULTIVITAMIN) TABS tablet Take 1 tablet by mouth daily at 12 noon.    . pyridOXINE (VITAMIN B-6) 100 MG tablet Take 100 mg by mouth daily.    . vitamin C (ASCORBIC ACID) 500 MG tablet Take 500 mg by mouth daily.     No current facility-administered medications for this visit.    Past Medical History  Diagnosis Date  . Cerebrovascular disease 2009    TIA; 2009- right ICA stent; re-intervention for restenosis complicated by [facility] w/o sx  . Degenerative joint disease     Total shoulder arthroplasty-right  . LBBB (left bundle branch block)  Normal echo-2011; stress nuclear in 09/2010--septal hypoperfusion representing nontransmural infarction or the effect of left bundle branch block, no ischemia  . Hyperlipidemia   . Hypertension   . Chronic obstructive pulmonary disease ([place])   . Tobacco abuse     -100 pack years; 1.5 packs per day  . Anxiety and depression   . Alcohol abuse     6 beers per day; hospital admission in 2009 for withdrawal symptoms  . Thrombocytopenia ([place])   . Tubular adenoma of colon   . Myocardial infarction [facility]) June 02, 2014    Massive Heart Attack  . CHF (congestive heart failure) ([place]) 06-02-14  . Alcoholic cirrhosis ([place])   . Peripheral vascular  disease ([place])   . AICD (automatic cardioverter/defibrillator) present 08/19/2015    St Jude BiV ICD for primary prevention by Dr. Lovena Le  . 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    Past Surgical History  Procedure Laterality Date  . Lumbar fusion  2010  . Vasectomy  1971  . Colonoscopy w/ polypectomy  2011  . Total shoulder arthroplasty Right 2011    Dr. Tamera Punt  . Colonoscopy  08/22/09    Fields-(Tubular Adenoma)3-mm transverse polyp/4-mm polyp otherwise noraml/small internal hemorrhoids  . Esophagogastroduodenoscopy [name] 03/05/2014    SLF: 1. Stricture at the gastroesophagael junction 2. Small hiatal hernia 3. Moderate non-erosive gastritis and duodentitis. 4. No surce for Melena identified.   . Agile capsule [name] 03/18/2014    Procedure: AGILE CAPSULE;  Surgeon: Danie Binder, MD;  [facility];  Service: Endoscopy;  Laterality: [name];  7:30  . Givens capsule study [name] 03/30/2014    Procedure: GIVENS CAPSULE STUDY;  Surgeon: Danie Binder, MD;  [facility];  Service: Endoscopy;  Laterality: [name];  7:30  . Colonoscopy [name] 04/14/2014    [id number] internal hemorrhids/normal mocsa in the terminal iluem/left colonis redundant  . Abdominal aortagram [name] 05/04/2014    Procedure: ABDOMINAL Maxcine Ham;  Surgeon: [name], MD;  Location: [facility] CATH LAB;  Service: Cardiovascular;  Laterality: [name];  . Joint replacement Right   . Back surgery    . Cataract extraction w/phaco Right 03/08/2015    Procedure: CATARACT EXTRACTION PHACO AND INTRAOCULAR LENS PLACEMENT (IOC);  Surgeon: Rutherford Guys, MD;  Location: [facility] ORS;  Service: Ophthalmology;  Laterality: Right;  CDE:9.46  . Cataract extraction w/phaco Left 03/22/2015    Procedure: CATARACT EXTRACTION PHACO AND INTRAOCULAR LENS PLACEMENT (IOC);  Surgeon: Rutherford Guys, MD;  Location: [facility] ORS;  Service: Ophthalmology;  Laterality: Left;  CDE:5.80  . Bacterial overgrowth test [name] 05/24/2015    Procedure: BACTERIAL OVERGROWTH  TEST;  Surgeon: Danie Binder, MD;  [facility];  Service: Endoscopy;  Laterality: [name];  0700  . Bi-ventricular implantable cardioverter defibrillator  (crt-d)  08/19/2015  . Ep implantable device [name] 08/19/2015    Procedure: BiV ICD Insertion CRT-D;  Surgeon: Evans Lance, MD;  Location: [place] CV LAB;  Service: Cardiovascular;  Laterality: [name];    Social History   Social History  . Marital Status: Married    Spouse Name: [name]  . Number of Children: 2  . Years of Education: [name]   Occupational History  . 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     Social History Main Topics  . Smoking status: Current Every Day Smoker -- 0.50 packs/day for 60 years    Types: E-cigarettes, Cigarettes    Start date: 08/20/1957  . Smokeless tobacco: Never Used  .  Alcohol Use: No     Comment: quit in July 2015; No ETOH to date 02/14/15  . Drug Use: No  . Sexual Activity: No   Other Topics Concern  . Not on file   Social History Narrative   Accompanied by daughter [name]   Lives w/ wife, daughter     Danley Danker Vitals:   01/17/16 0816  BP: 128/78  Pulse: 73  Height: 5\' 4"  (1.626 m)  Weight: 157 lb (71.215 kg)  SpO2: 96%    PHYSICAL EXAM General: NAD HEENT: Normal. Neck: No JVD, no thyromegaly. Lungs: Clear to auscultation bilaterally with normal respiratory effort. CV: Nondisplaced PMI.  Regular rate and rhythm, normal S1/S2, no S3/S4, no murmur. No pretibial or periankle edema.  No carotid bruit.   Abdomen: Soft, nontender, no distention.  Neurologic: Alert and oriented.  Psych: Normal affect. Skin: Normal. Musculoskeletal: No gross deformities.  ECG: Most recent ECG reviewed.      ASSESSMENT AND PLAN: 1. CAD: Stable ischemic heart disease. Nuclear MPI study results noted above. Continue ASA, Plavix, Coreg, and Lipitor.   2. Chronic systolic heart failure s/p BivICD: Euvolemic and stable on Lasix 20 mg. Continue carvedilol and Entresto.   3. Tobacco  abuse: Continues to smoke. Previously gave cessation counseling. Appears he is unwilling to quit.  4. PVD: Followed by vascular surgery. Continue ASA and statin. Right 0.9, left 0.69. Encouraged on walking efforts.  5. Hyperlipidemia: Continue Lipitor 20 mg.    6. Essential HTN: Well controlled on current therapy. No changes.  7. Preoperative risk stratification: Can proceed with carpal tunnel surgery as planned without noninvasive testing.  Dispo: f/u 1 year.   Kate Sable, M.D., F.A.C.C.

## 2016-01-20 ENCOUNTER — Encounter (HOSPITAL_BASED_OUTPATIENT_CLINIC_OR_DEPARTMENT_OTHER): Payer: Self-pay | Admitting: *Deleted

## 2016-01-23 ENCOUNTER — Encounter (HOSPITAL_COMMUNITY)
Admission: RE | Admit: 2016-01-23 | Discharge: 2016-01-23 | Disposition: A | Discharge status: Home / Self Care | Payer: Medicare Other | Source: Ambulatory Visit | Attending: Orthopedic Surgery | Admitting: Orthopedic Surgery

## 2016-01-23 ENCOUNTER — Other Ambulatory Visit: Payer: Self-pay

## 2016-01-23 DIAGNOSIS — K759 Inflammatory liver disease, unspecified: Secondary | ICD-10-CM | POA: Diagnosis not present

## 2016-01-23 DIAGNOSIS — Z01812 Encounter for preprocedural laboratory examination: Secondary | ICD-10-CM | POA: Diagnosis not present

## 2016-01-23 DIAGNOSIS — K769 Liver disease, unspecified: Secondary | ICD-10-CM | POA: Insufficient documentation

## 2016-01-23 DIAGNOSIS — B19 Unspecified viral hepatitis: Secondary | ICD-10-CM | POA: Diagnosis not present

## 2016-01-23 LAB — COMPREHENSIVE METABOLIC PANEL
ALT: 16 U/L — AB (ref 17–63)
AST: 23 U/L (ref 15–41)
Albumin: 4.5 g/dL (ref 3.5–5.0)
Alkaline Phosphatase: 81 U/L (ref 38–126)
Anion gap: 8 (ref 5–15)
BUN: 12 mg/dL (ref 6–20)
CHLORIDE: 98 mmol/L — AB (ref 101–111)
CO2: 28 mmol/L (ref 22–32)
CREATININE: 0.62 mg/dL (ref 0.61–1.24)
Calcium: 8.7 mg/dL — ABNORMAL LOW (ref 8.9–10.3)
GFR calc non Af Amer: >60 mL/min (ref >60)
Glucose, Bld: 100 mg/dL — ABNORMAL HIGH (ref 65–99)
POTASSIUM: 4.3 mmol/L (ref 3.5–5.1)
Sodium: 134 mmol/L — ABNORMAL LOW (ref 135–145)
Total Bilirubin: 0.4 mg/dL (ref 0.3–1.2)
Total Protein: 7.1 g/dL (ref 6.5–8.1)

## 2016-01-23 NOTE — Progress Notes (Signed)
Faxed CMET and EKG form to [facility] to have pt have labs done there. Dr. Al Corpus reviewed pt's history, wants clearance from Dr.Taylor about His AICD and Pacemaker. Faxed implanted device form to Dr.Taylor's office. Pt instructed to bring all medications.

## 2016-01-26 ENCOUNTER — Ambulatory Visit (HOSPITAL_BASED_OUTPATIENT_CLINIC_OR_DEPARTMENT_OTHER): Admission: RE | Admit: 2016-01-26 | Payer: Medicare Other | Source: Ambulatory Visit | Admitting: Orthopedic Surgery

## 2016-01-26 SURGERY — CARPAL TUNNEL RELEASE
Anesthesia: General | Laterality: Left

## 2016-01-30 ENCOUNTER — Encounter (HOSPITAL_BASED_OUTPATIENT_CLINIC_OR_DEPARTMENT_OTHER): Payer: Self-pay | Admitting: *Deleted

## 2016-01-30 NOTE — Progress Notes (Signed)
Chart reviewed again by Dr Al Corpus, [place] for [facility].

## 2016-02-02 ENCOUNTER — Ambulatory Visit (HOSPITAL_BASED_OUTPATIENT_CLINIC_OR_DEPARTMENT_OTHER): Payer: Medicare Other | Admitting: Anesthesiology

## 2016-02-02 ENCOUNTER — Encounter (HOSPITAL_BASED_OUTPATIENT_CLINIC_OR_DEPARTMENT_OTHER): Payer: Self-pay | Admitting: Orthopedic Surgery

## 2016-02-02 ENCOUNTER — Ambulatory Visit (HOSPITAL_BASED_OUTPATIENT_CLINIC_OR_DEPARTMENT_OTHER)
Admission: RE | Admit: 2016-02-02 | Discharge: 2016-02-02 | Disposition: A | Discharge status: Home / Self Care | Payer: Medicare Other | Source: Ambulatory Visit | Attending: Orthopedic Surgery | Admitting: Orthopedic Surgery

## 2016-02-02 ENCOUNTER — Encounter (HOSPITAL_BASED_OUTPATIENT_CLINIC_OR_DEPARTMENT_OTHER)
Admission: RE | Disposition: A | Discharge status: Home / Self Care | Payer: Self-pay | Source: Ambulatory Visit | Attending: Orthopedic Surgery

## 2016-02-02 DIAGNOSIS — I252 Old myocardial infarction: Secondary | ICD-10-CM | POA: Insufficient documentation

## 2016-02-02 DIAGNOSIS — F419 Anxiety disorder, unspecified: Secondary | ICD-10-CM | POA: Insufficient documentation

## 2016-02-02 DIAGNOSIS — I739 Peripheral vascular disease, unspecified: Secondary | ICD-10-CM | POA: Diagnosis not present

## 2016-02-02 DIAGNOSIS — K7030 Alcoholic cirrhosis of liver without ascites: Secondary | ICD-10-CM | POA: Insufficient documentation

## 2016-02-02 DIAGNOSIS — G5622 Lesion of ulnar nerve, left upper limb: Secondary | ICD-10-CM | POA: Diagnosis not present

## 2016-02-02 DIAGNOSIS — Z95810 Presence of automatic (implantable) cardiac defibrillator: Secondary | ICD-10-CM | POA: Diagnosis not present

## 2016-02-02 DIAGNOSIS — F329 Major depressive disorder, single episode, unspecified: Secondary | ICD-10-CM | POA: Diagnosis not present

## 2016-02-02 DIAGNOSIS — J449 Chronic obstructive pulmonary disease, unspecified: Secondary | ICD-10-CM | POA: Diagnosis not present

## 2016-02-02 DIAGNOSIS — Z8673 Personal history of transient ischemic attack (TIA), and cerebral infarction without residual deficits: Secondary | ICD-10-CM | POA: Diagnosis not present

## 2016-02-02 DIAGNOSIS — G5602 Carpal tunnel syndrome, left upper limb: Secondary | ICD-10-CM | POA: Diagnosis not present

## 2016-02-02 DIAGNOSIS — F1010 Alcohol abuse, uncomplicated: Secondary | ICD-10-CM | POA: Diagnosis not present

## 2016-02-02 DIAGNOSIS — F17290 Nicotine dependence, other tobacco product, uncomplicated: Secondary | ICD-10-CM | POA: Insufficient documentation

## 2016-02-02 DIAGNOSIS — I10 Essential (primary) hypertension: Secondary | ICD-10-CM | POA: Insufficient documentation

## 2016-02-02 DIAGNOSIS — E785 Hyperlipidemia, unspecified: Secondary | ICD-10-CM | POA: Diagnosis not present

## 2016-02-02 HISTORY — PX: ULNAR TUNNEL RELEASE: SHX820

## 2016-02-02 HISTORY — PX: CARPAL TUNNEL RELEASE: SHX101

## 2016-02-02 HISTORY — PX: ULNAR NERVE TRANSPOSITION: SHX2595

## 2016-02-02 SURGERY — CARPAL TUNNEL RELEASE
Anesthesia: General | Site: Wrist | Laterality: Left

## 2016-02-02 MED ORDER — HYDROCODONE-ACETAMINOPHEN 5-325 MG PO TABS: 1.0000 | ORAL_TABLET | Freq: Four times a day (QID) | ORAL | Status: DC | PRN

## 2016-02-02 MED ORDER — DEXAMETHASONE SODIUM PHOSPHATE 10 MG/ML IJ SOLN
INTRAMUSCULAR | Status: AC
  Filled 2016-02-02: qty 1

## 2016-02-02 MED ORDER — MIDAZOLAM HCL 2 MG/2ML IJ SOLN
1.0000 mg | INTRAMUSCULAR | Status: DC | PRN
  Administered 2016-02-02: 1 mg via INTRAVENOUS

## 2016-02-02 MED ORDER — FENTANYL CITRATE (PF) 100 MCG/2ML IJ SOLN
50.0000 ug | INTRAMUSCULAR | Status: DC | PRN
  Administered 2016-02-02: 25 ug via INTRAVENOUS
  Administered 2016-02-02: 50 ug via INTRAVENOUS

## 2016-02-02 MED ORDER — CEFAZOLIN SODIUM-DEXTROSE 2-3 GM-% IV SOLR
2.0000 g | INTRAVENOUS | Status: AC
  Administered 2016-02-02: 2 g via INTRAVENOUS

## 2016-02-02 MED ORDER — OXYCODONE HCL 5 MG/5ML PO SOLN: 5.0000 mg | Freq: Once | ORAL | Status: DC | PRN

## 2016-02-02 MED ORDER — SCOPOLAMINE 1 MG/3DAYS TD PT72: 1.0000 | MEDICATED_PATCH | Freq: Once | TRANSDERMAL | Status: DC | PRN

## 2016-02-02 MED ORDER — BUPIVACAINE HCL (PF) 0.25 % IJ SOLN
INTRAMUSCULAR | Status: AC
Start: 2016-02-02 — End: 2016-02-02
  Filled 2016-02-02: qty 30

## 2016-02-02 MED ORDER — OXYCODONE HCL 5 MG PO TABS: 5.0000 mg | ORAL_TABLET | Freq: Once | ORAL | Status: DC | PRN

## 2016-02-02 MED ORDER — LACTATED RINGERS IV SOLN
INTRAVENOUS | Status: DC
  Administered 2016-02-02: 08:00:00 via INTRAVENOUS

## 2016-02-02 MED ORDER — CHLORHEXIDINE GLUCONATE 4 % EX LIQD: 60.0000 mL | Freq: Once | CUTANEOUS | Status: DC

## 2016-02-02 MED ORDER — MIDAZOLAM HCL 2 MG/2ML IJ SOLN
INTRAMUSCULAR | Status: AC
  Filled 2016-02-02: qty 2

## 2016-02-02 MED ORDER — LIDOCAINE HCL (CARDIAC) 20 MG/ML IV SOLN
INTRAVENOUS | Status: DC | PRN
  Administered 2016-02-02: 50 mg via INTRAVENOUS

## 2016-02-02 MED ORDER — FENTANYL CITRATE (PF) 100 MCG/2ML IJ SOLN
INTRAMUSCULAR | Status: AC
  Filled 2016-02-02: qty 2

## 2016-02-02 MED ORDER — GLYCOPYRROLATE 0.2 MG/ML IJ SOLN: 0.2000 mg | Freq: Once | INTRAMUSCULAR | Status: DC | PRN

## 2016-02-02 MED ORDER — PHENYLEPHRINE HCL 10 MG/ML IJ SOLN
INTRAMUSCULAR | Status: AC
  Filled 2016-02-02: qty 1

## 2016-02-02 MED ORDER — PROPOFOL 500 MG/50ML IV EMUL
INTRAVENOUS | Status: AC
Start: 2016-02-02 — End: 2016-02-02
  Filled 2016-02-02: qty 50

## 2016-02-02 MED ORDER — MEPERIDINE HCL 25 MG/ML IJ SOLN: 6.2500 mg | INTRAMUSCULAR | Status: DC | PRN

## 2016-02-02 MED ORDER — LIDOCAINE HCL (CARDIAC) 20 MG/ML IV SOLN
INTRAVENOUS | Status: AC
  Filled 2016-02-02: qty 5

## 2016-02-02 MED ORDER — ONDANSETRON HCL 4 MG/2ML IJ SOLN
INTRAMUSCULAR | Status: AC
  Filled 2016-02-02: qty 2

## 2016-02-02 MED ORDER — HYDROMORPHONE HCL 1 MG/ML IJ SOLN: 0.2500 mg | INTRAMUSCULAR | Status: DC | PRN

## 2016-02-02 MED ORDER — BUPIVACAINE HCL (PF) 0.25 % IJ SOLN
INTRAMUSCULAR | Status: DC | PRN
  Administered 2016-02-02: 10 mL

## 2016-02-02 MED ORDER — PROPOFOL 500 MG/50ML IV EMUL
INTRAVENOUS | Status: DC | PRN
  Administered 2016-02-02: 200 ug/kg/min via INTRAVENOUS

## 2016-02-02 MED ORDER — LIDOCAINE HCL (PF) 0.5 % IJ SOLN
INTRAMUSCULAR | Status: DC | PRN
  Administered 2016-02-02: 50 mL via INTRAVENOUS

## 2016-02-02 SURGICAL SUPPLY — 55 items
BLADE MINI RND TIP GREEN BEAV (BLADE) IMPLANT
BLADE SURG 15 STRL LF DISP TIS (BLADE) ×2 IMPLANT
BLADE SURG 15 STRL SS (BLADE) ×4
BNDG CMPR 9X4 STRL LF SNTH (GAUZE/BANDAGES/DRESSINGS)
BNDG COHESIVE 3X5 TAN STRL LF (GAUZE/BANDAGES/DRESSINGS) ×8 IMPLANT
BNDG ESMARK 4X9 LF (GAUZE/BANDAGES/DRESSINGS) ×2 IMPLANT
BNDG GAUZE ELAST 4 BULKY (GAUZE/BANDAGES/DRESSINGS) ×4 IMPLANT
CHLORAPREP W/TINT 26ML (MISCELLANEOUS) ×4 IMPLANT
CORDS BIPOLAR (ELECTRODE) ×4 IMPLANT
COVER BACK TABLE 60X90IN (DRAPES) ×4 IMPLANT
COVER MAYO STAND STRL (DRAPES) ×4 IMPLANT
CUFF TOURN SGL LL 18 NRW (TOURNIQUET CUFF) ×4 IMPLANT
CUFF TOURNIQUET SINGLE 18IN (TOURNIQUET CUFF) ×4 IMPLANT
DECANTER SPIKE VIAL GLASS SM (MISCELLANEOUS) IMPLANT
DRAPE EXTREMITY T 121X128X90 (DRAPE) ×4 IMPLANT
DRAPE SURG 17X23 STRL (DRAPES) ×4 IMPLANT
DRSG PAD ABDOMINAL 8X10 ST (GAUZE/BANDAGES/DRESSINGS) ×4 IMPLANT
GAUZE SPONGE 4X4 12PLY STRL (GAUZE/BANDAGES/DRESSINGS) ×4 IMPLANT
GAUZE SPONGE 4X4 16PLY XRAY LF (GAUZE/BANDAGES/DRESSINGS) IMPLANT
GAUZE XEROFORM 1X8 LF (GAUZE/BANDAGES/DRESSINGS) ×4 IMPLANT
GLOVE BIO SURGEON STRL SZ 6.5 (GLOVE) ×1 IMPLANT
GLOVE BIO SURGEONS STRL SZ 6.5 (GLOVE) ×1
GLOVE BIOGEL PI IND STRL 7.0 (GLOVE) IMPLANT
GLOVE BIOGEL PI IND STRL 8.5 (GLOVE) ×2 IMPLANT
GLOVE BIOGEL PI INDICATOR 7.0 (GLOVE) ×4
GLOVE BIOGEL PI INDICATOR 8.5 (GLOVE) ×2
GLOVE SURG ORTHO 8.0 STRL STRW (GLOVE) ×4 IMPLANT
GOWN STRL REUS W/ TWL LRG LVL3 (GOWN DISPOSABLE) ×2 IMPLANT
GOWN STRL REUS W/TWL LRG LVL3 (GOWN DISPOSABLE) ×4
GOWN STRL REUS W/TWL XL LVL3 (GOWN DISPOSABLE) ×4 IMPLANT
LOOP VESSEL MAXI BLUE (MISCELLANEOUS) IMPLANT
NDL PRECISIONGLIDE 27X1.5 (NEEDLE) IMPLANT
NDL SUT 6 .5 CRC .975X.05 MAYO (NEEDLE) IMPLANT
NEEDLE MAYO TAPER (NEEDLE)
NEEDLE PRECISIONGLIDE 27X1.5 (NEEDLE) ×4 IMPLANT
NS IRRIG 1000ML POUR BTL (IV SOLUTION) ×4 IMPLANT
PACK BASIN DAY SURGERY FS (CUSTOM PROCEDURE TRAY) ×4 IMPLANT
PAD CAST 3X4 CTTN HI CHSV (CAST SUPPLIES) ×2 IMPLANT
PAD CAST 4YDX4 CTTN HI CHSV (CAST SUPPLIES) ×2 IMPLANT
PADDING CAST ABS 4INX4YD NS (CAST SUPPLIES) ×2
PADDING CAST ABS COTTON 4X4 ST (CAST SUPPLIES) ×2 IMPLANT
PADDING CAST COTTON 3X4 STRL (CAST SUPPLIES) ×4
PADDING CAST COTTON 4X4 STRL (CAST SUPPLIES) ×4
SLEEVE SCD COMPRESS KNEE MED (MISCELLANEOUS) ×4 IMPLANT
SPLINT PLASTER CAST XFAST 3X15 (CAST SUPPLIES) IMPLANT
SPLINT PLASTER XTRA FASTSET 3X (CAST SUPPLIES)
STOCKINETTE 4X48 STRL (DRAPES) ×4 IMPLANT
SUT ETHILON 4 0 PS 2 18 (SUTURE) ×4 IMPLANT
SUT VIC AB 2-0 SH 27 (SUTURE) ×4
SUT VIC AB 2-0 SH 27XBRD (SUTURE) ×2 IMPLANT
SUT VICRYL 4-0 PS2 18IN ABS (SUTURE) ×4 IMPLANT
SYR BULB 3OZ (MISCELLANEOUS) ×4 IMPLANT
SYR CONTROL 10ML LL (SYRINGE) ×2 IMPLANT
TOWEL OR 17X24 6PK STRL BLUE (TOWEL DISPOSABLE) ×4 IMPLANT
UNDERPAD 30X30 (UNDERPADS AND DIAPERS) ×4 IMPLANT

## 2016-02-02 NOTE — Anesthesia Preprocedure Evaluation (Signed)
Anesthesia Evaluation  Patient identified by MRN, date of birth, ID band Patient awake    Reviewed: Allergy & Precautions, NPO status , Patient's Chart, lab work & pertinent test results  Airway Mallampati: I  TM Distance: >3 FB Neck ROM: Full    Dental  (+) Teeth Intact, Dental Advisory Given   Pulmonary COPD, Current Smoker,    breath sounds clear to auscultation       Cardiovascular hypertension, Pt. on medications and Pt. on home beta blockers + Past MI  + pacemaker + Cardiac Defibrillator  Rhythm:Regular Rate:Normal     Neuro/Psych    GI/Hepatic   Endo/Other    Renal/GU      Musculoskeletal   Abdominal   Peds  Hematology   Anesthesia Other Findings   Reproductive/Obstetrics                             Anesthesia Physical Anesthesia Plan  ASA: I  Anesthesia Plan: General   Post-op Pain Management:    Induction: Inhalational  Airway Management Planned: LMA  Additional Equipment:   Intra-op Plan:   Post-operative Plan: Extubation in OR  Informed Consent: I have reviewed the patients History and Physical, chart, labs and discussed the procedure including the risks, benefits and alternatives for the proposed anesthesia with the patient or authorized representative who has indicated his/her understanding and acceptance.   Dental advisory given  Plan Discussed with: CRNA, Anesthesiologist and Surgeon  Anesthesia Plan Comments:         Anesthesia Quick Evaluation

## 2016-02-02 NOTE — Discharge Instructions (Addendum)

## 2016-02-02 NOTE — Anesthesia Procedure Notes (Signed)
Procedure Name: MAC Performed by: Soren Pigman W Pre-anesthesia Checklist: Patient identified, Timeout performed, Emergency Drugs available, Suction available and Patient being monitored Patient Re-evaluated:Patient Re-evaluated prior to inductionOxygen Delivery Method: Simple face mask Placement Confirmation: positive ETCO2     

## 2016-02-02 NOTE — Transfer of Care (Signed)
Immediate Anesthesia Transfer of Care Note  Patient: Roy [name]  Procedure(s) Performed: Procedure(s) with comments: LEFT CARPAL TUNNEL RELEASE (Left) - ANESTHESIA: IV REGIONAL UPPER ARM LEFT IN-SITU DECOMPRESSION ULNAR NERVE  (Left) LEFT CUBITAL TUNNEL RELEASE (Left)  Patient Location: PACU  Anesthesia Type:MAC and Bier block  Level of Consciousness: awake, alert  and oriented  Airway & Oxygen Therapy: Patient Spontanous Breathing and Patient connected to face mask oxygen  Post-op Assessment: Report given to RN and Post -op Vital signs reviewed and stable  Post vital signs: Reviewed and stable  Last Vitals:  Filed Vitals:   02/02/16 0744 02/02/16 0932  BP:    Pulse:  60  Temp: 36.3 C   Resp:  16    Complications: No apparent anesthesia complications

## 2016-02-02 NOTE — Op Note (Signed)
NAME:  RAFID, WATT NO.:  [id number]  MEDICAL RECORD NO.:  [id number]  LOCATION:                                 FACILITY:  PHYSICIAN:  Daryll Brod, M.D.       DATE OF BIRTH:  Oct 02, 1946  DATE OF PROCEDURE:  02/02/2016 DATE OF DISCHARGE:                              OPERATIVE REPORT   PREOPERATIVE DIAGNOSIS:  Carpal tunnel syndrome, cubital tunnel syndrome, left arm.  POSTOPERATIVE DIAGNOSIS:  Carpal tunnel syndrome, cubital tunnel syndrome, left arm.  OPERATION:  Decompression of left median nerve at the wrist, decompression of left ulnar nerve at the elbow.  SURGEON:  Daryll Brod, M.D.  ASSISTANT:  None.  ANESTHESIA:  Upper arm IV regional with local infiltration.  ANESTHESIOLOGIST:  Lorrene Reid, M.D.  PLACE OF SURGERY:  [facility].  HISTORY:  The patient is a 70 year old male with history of numbness and tingling of his left arm, hand.  Nerve conductions are positive for compression of the median nerve at the wrist, compression of the ulnar nerve at the elbow.  He has elected to undergo surgical decompression. Pre, peri, and postoperative course have been discussed along with risks and complications.  He is aware that there is no guarantee with the surgery; possibility of infection; recurrence of injury to arteries, nerves, tendons; incomplete relief of symptoms; dystrophy.  In the preoperative area, the patient is seen, the extremity marked by both patient and surgeon and antibiotic given.  PROCEDURE IN DETAIL:  The patient was brought to the operating room, where upper arm IV regional anesthetic was carried out without difficulty.  He was prepped using ChloraPrep, supine position with a left arm free.  A 3-minute dry time was allowed, time-out taken, confirming the patient and procedure.  A longitudinal incision was made in the left palm, carried down through the subcutaneous tissue. Bleeders were electrocauterized meticulously  with bipolar.  Dissection carried down splitting the palmar fascia and retractor was placed.  The superficial palmar arch, flexor tendon to the ring and little finger were identified to the ulnar side of the median nerve.  The carpal retinaculum was then incised with sharp dissection, and placed retractors protecting both median nerve radially and ulnar nerve ulnarly.  Right angle and Sewall retractor were placed between the skin and forearm fascia.  The fascia was released for approximately 3 cm proximal to the wrist crease under direct vision.  Canal was explored. Air compression to the nerve was immediately apparent.  The motor branch was entered into the muscle distally.  The wound was copiously irrigated with saline.  The skin closed with interrupted 4-0 nylon sutures.  A separate incision was then made over the medial epicondyle of the left elbow, carried down through the subcutaneous tissue.  Bleeders were again electrocauterized.  The posterior branches of the medial antebrachial cutaneous nerve of the forearm were identified.  The dissection carried down to the ulnar nerve, fairly large triceps was present, this was partially released.  The subcutaneous tissue was separated from the overlying flexor carpi ulnaris fascia distally.  The fascia was then split, the muscle was then bluntly split.  Bleeders were again electrocauterized  with bipolar.  The deep fascia was then released using an EMT angled scissors and a KMI carpal tunnel guide.  This was performed for approximately 5 cm distally.  Attention was then directed proximally.  The brachial fascia was then separated from the overlying skin and subcutaneous tissue.  Retractor was again placed.  The [facility] guide was placed between the ulnar nerve and the brachial fascia, and the fascia released again for approximately 3-5 cm.  The elbow was fully flexed.  Bleeders were cauterized with bipolar.  No subluxation to the nerve was  apparent.  The wound was irrigated with saline.  The subcutaneous tissue closed with interrupted 2-0 Vicryl sutures and the skin with interrupted 4-0 nylon sutures.  Sterile compressive dressing to each of the wounds was applied after local infiltration with 0.25% bupivacaine, total of 10 mL was used.  On deflation of the tourniquet, all fingers were immediately pinked.  He was taken to the recovery room for observation in satisfactory condition.  He will be discharged to home to return to the [place] in 1 week, on [place].          ______________________________ Daryll Brod, M.D.     GK/MEDQ  D:  02/02/2016  T:  02/02/2016  Job:  [id number]

## 2016-02-02 NOTE — Brief Op Note (Signed)
02/02/2016  9:35 AM  PATIENT:  [name]  70 y.o. male  PRE-OPERATIVE DIAGNOSIS:  LEFT CARPAL TUNNEL SYNDROME, LEFT ULNAR NERVE COMPRESSION, CUBITAL TUNNEL  POST-OPERATIVE DIAGNOSIS:  LEFT CARPAL TUNNEL SYNDROME LEFT ULNAR NERVE COMPRESSION, CUBITAL TUNNEL  PROCEDURE:  Procedure(s) with comments: LEFT CARPAL TUNNEL RELEASE (Left) - ANESTHESIA: IV REGIONAL UPPER ARM LEFT IN-SITU DECOMPRESSION ULNAR NERVE  (Left) LEFT CUBITAL TUNNEL RELEASE (Left)  SURGEON:  Surgeon(s) and Role:    * Daryll Brod, MD - Primary  PHYSICIAN ASSISTANT:   ASSISTANTS: none   ANESTHESIA:   local and regional  EBL:  Total I/O In: 600 [I.V.:600] Out: -   BLOOD ADMINISTERED:none  DRAINS: none   LOCAL MEDICATIONS USED:  BUPIVICAINE   SPECIMEN:  No Specimen  DISPOSITION OF SPECIMEN:  [name]  COUNTS:  YES  TOURNIQUET:   Total Tourniquet Time Documented: Upper Arm (Left) - 42 minutes Total: Upper Arm (Left) - 42 minutes   DICTATION: .Other Dictation: Dictation Number [id number]  PLAN OF CARE: Discharge to home after PACU  PATIENT DISPOSITION:  PACU - hemodynamically stable.

## 2016-02-02 NOTE — Anesthesia Postprocedure Evaluation (Signed)
Anesthesia Post Note  Patient: Roy [name]  Procedure(s) Performed: Procedure(s) (LRB): LEFT CARPAL TUNNEL RELEASE (Left) LEFT IN-SITU DECOMPRESSION ULNAR NERVE  (Left) LEFT CUBITAL TUNNEL RELEASE (Left)  Patient location during evaluation: PACU Anesthesia Type: Bier Block and MAC Level of consciousness: awake and alert Pain management: pain level controlled Vital Signs Assessment: post-procedure vital signs reviewed and stable Respiratory status: spontaneous breathing, nonlabored ventilation and respiratory function stable Cardiovascular status: blood pressure returned to baseline and stable Postop Assessment: no signs of nausea or vomiting Anesthetic complications: no    Last Vitals:  Filed Vitals:   02/02/16 1015 02/02/16 1049  BP: 158/69 168/71  Pulse: 63 60  Temp:  36.5 C  Resp: 17 18    Last Pain:  Filed Vitals:   02/02/16 1050  PainSc: 0-No pain                 Gennie Eisinger A

## 2016-02-02 NOTE — Op Note (Signed)
Dictation Number [id number]

## 2016-02-02 NOTE — H&P (Signed)
Roy [name] is an 70 y.o. male.   Chief Complaint: numbness left arm HPI: Roy [name] is a 70 year old right hand dominant male referred by Roy [name] for consultation with respect to numbness, tingling in his left arm. This is relatively constant and has been going on for at least 20 years. He states he had nerve conductions done 20 years ago. He states that this has not improved. He is having problems, shuffling cards, buttoning his shirt, he states that they feel cold and tingly. He is not awakened at night. He has no history of injury to his hand or to his neck. He has a prior history of a fracture on his right side. He is not complaining of symptoms on the right hand. He is on gabapentin for his back. He is not able to take Motrin so he is taking Tylenol. He has no history of diabetes, thyroid problems, arthritis or gout. There is no history of these in his family of diabetes, thyroid problems, arthritis or gout. He states nothing seems to have improved it, nothing stressing makes it worse as he states that it is relatively constant. NCV's confirm cts and cubital tunnel left arm.  Past Medical History  Diagnosis Date  . Anxiety  . COPD (chronic obstructive pulmonary disease) ([place])  . Coronary artery disease  . GERD (gastroesophageal reflux disease)  . Heart disease  . Hypertension  . Irregular heart beat  pacemaker put in  . Stroke [facility])   Past Surgical History  Procedure Laterality Date  . Spinal fusion  . Shoulder arthroscopy  . Cardiac surgery  . A-v cardiac pacemaker insertion  . Coronary stent placement  . Stent placement  stent placed in brain due to blockage    Past Medical History  Diagnosis Date  . Cerebrovascular disease 2009    TIA; 2009- right ICA stent; re-intervention for restenosis complicated by [facility] w/o sx  . Degenerative joint disease     Total shoulder arthroplasty-right  . LBBB (left bundle branch block)     Normal echo-2011; stress nuclear in  09/2010--septal hypoperfusion representing nontransmural infarction or the effect of left bundle branch block, no ischemia  . Hyperlipidemia   . Hypertension   . Chronic obstructive pulmonary disease ([place])   . Tobacco abuse     -100 pack years; 1.5 packs per day  . Anxiety and depression   . Alcohol abuse     6 beers per day; hospital admission in 2009 for withdrawal symptoms  . Thrombocytopenia ([place])   . Tubular adenoma of colon   . Myocardial infarction [facility]) June 02, 2014    Massive Heart Attack  . CHF (congestive heart failure) ([place]) 06-02-14  . Alcoholic cirrhosis ([place])   . Peripheral vascular disease ([place])   . AICD (automatic cardioverter/defibrillator) present 08/19/2015    St Jude BiV ICD for primary prevention by Dr. Lovena [name]  . 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    Past Surgical History  Procedure Laterality Date  . Lumbar fusion  2010  . Vasectomy  1971  . Colonoscopy w/ polypectomy  2011  . Total shoulder arthroplasty Right 2011    Dr. Tamera [name]  . Colonoscopy  08/22/09    Fields-(Tubular Adenoma)3-mm transverse polyp/4-mm polyp otherwise noraml/small internal hemorrhoids  . Esophagogastroduodenoscopy [name] 03/05/2014    SLF: 1. Stricture at the gastroesophagael junction 2. Small hiatal hernia 3. Moderate non-erosive gastritis and duodentitis. 4. No surce for Melena identified.   . Agile capsule [name] 03/18/2014  Procedure: AGILE CAPSULE;  Surgeon: Roy Binder, MD;  Location: [facility] ENDO SUITE;  Service: Endoscopy;  Laterality: [name];  7:30  . Givens capsule study [name] 03/30/2014    Procedure: GIVENS CAPSULE STUDY;  Surgeon: Roy Binder, MD;  Location: [facility] ENDO SUITE;  Service: Endoscopy;  Laterality: [name];  7:30  . Colonoscopy [name] 04/14/2014    [id number] internal hemorrhids/normal mocsa in the terminal iluem/left colonis redundant  . Abdominal aortagram [name] 05/04/2014    Procedure: ABDOMINAL Roy [name];  Surgeon: Roy Mitchell, MD;  Location: [facility] CATH LAB;  Service:  Cardiovascular;  Laterality: [name];  . Joint replacement Right   . Back surgery    . Cataract extraction w/phaco Right 03/08/2015    Procedure: CATARACT EXTRACTION PHACO AND INTRAOCULAR LENS PLACEMENT (IOC);  Surgeon: Roy Guys, MD;  Location: [facility] ORS;  Service: Ophthalmology;  Laterality: Right;  CDE:9.46  . Cataract extraction w/phaco Left 03/22/2015    Procedure: CATARACT EXTRACTION PHACO AND INTRAOCULAR LENS PLACEMENT (IOC);  Surgeon: Roy Guys, MD;  Location: [facility] ORS;  Service: Ophthalmology;  Laterality: Left;  CDE:5.80  . Bacterial overgrowth test [name] 05/24/2015    Procedure: BACTERIAL OVERGROWTH TEST;  Surgeon: Roy Binder, MD;  Location: [facility] ENDO [facility];  Service: Endoscopy;  Laterality: [name];  0700  . Bi-ventricular implantable cardioverter defibrillator  (crt-d)  08/19/2015  . Ep implantable device [name] 08/19/2015    Procedure: BiV ICD Insertion CRT-D;  Surgeon: Evans Lance, MD;  Location: [place] CV LAB;  Service: Cardiovascular;  Laterality: [name];    Family History  Problem Relation Age of Onset  . Prostate cancer Brother   . Coronary artery disease Mother   . Diabetes Mother   . Heart disease Mother     Before age 51 - 90 Bypasses  . Hypertension Mother   . Heart attack Mother     3-4 Heart attacks  . Alzheimer's disease Sister   . Alzheimer's disease Father   . Diabetes Father   . Colon cancer Neg Hx   . Heart attack Sister   . Cancer Brother     Prostate  . Hyperlipidemia Son    Social History:  reports that he has been smoking E-cigarettes and Cigarettes.  He started smoking about 58 years ago. He has a 60 pack-year smoking history. He has never used smokeless tobacco. He reports that he does not drink alcohol or use illicit drugs.  Allergies: No Known Allergies  No prescriptions prior to admission    No results found for this or any previous visit (from the past 48 hour(s)).  No results found.   Pertinent items are noted in HPI.  Height 5\' 4"  (1.626  m), weight 72.122 kg (159 lb).  General appearance: alert, cooperative and appears stated age Head: Normocephalic, without obvious abnormality Neck: no JVD Resp: clear to auscultation bilaterally Cardio: regular rate and rhythm, S1, S2 normal, no murmur, click, rub or gallop GI: soft, non-tender; bowel sounds normal; no masses,  no organomegaly Extremities: numbness left hand Pulses: 2+ and symmetric Skin: Skin color, texture, turgor normal. No rashes or lesions Neurologic:numbness left hand Incision/Wound: na  Assessment/Plan We have discussed the possibility of surgical decompression to the median and ulnar nerve on his left side and that he is symptomatic at both. He is aware that there is no guarantee with the surgery, possibility of infection, recurrence, injury to arteries, nerves, tendons, incomplete relief of symptoms dystrophy. He is aware we are attempting to halt the process rather  than see it totally improve. Hopefully, it will improve, but we cannot guarantee that. Questions are encouraged and answered to his satisfaction. He has multiple medical problems including stents in his heart. This will be scheduled as an outpatient under regional anesthesia with sedation.   Ashten Prats R 02/02/2016, 5:30 AM

## 2016-02-03 ENCOUNTER — Encounter (HOSPITAL_BASED_OUTPATIENT_CLINIC_OR_DEPARTMENT_OTHER): Payer: Self-pay | Admitting: Orthopedic Surgery

## 2016-02-10 DIAGNOSIS — M25441 Effusion, right hand: Secondary | ICD-10-CM | POA: Diagnosis not present

## 2016-02-10 DIAGNOSIS — R52 Pain, unspecified: Secondary | ICD-10-CM | POA: Diagnosis not present

## 2016-02-10 DIAGNOSIS — M79641 Pain in right hand: Secondary | ICD-10-CM | POA: Diagnosis not present

## 2016-02-14 ENCOUNTER — Ambulatory Visit (INDEPENDENT_AMBULATORY_CARE_PROVIDER_SITE_OTHER): Payer: Medicare Other | Admitting: Nurse Practitioner

## 2016-02-14 ENCOUNTER — Encounter: Payer: Self-pay | Admitting: Gastroenterology

## 2016-02-14 ENCOUNTER — Encounter: Payer: Self-pay | Admitting: Nurse Practitioner

## 2016-02-14 VITALS — BP 128/70 | HR 76 | Temp 97.8°F | Ht 64.0 in | Wt 153.8 lb

## 2016-02-14 DIAGNOSIS — I70213 Atherosclerosis of native arteries of extremities with intermittent claudication, bilateral legs: Secondary | ICD-10-CM

## 2016-02-14 DIAGNOSIS — K7030 Alcoholic cirrhosis of liver without ascites: Secondary | ICD-10-CM

## 2016-02-14 LAB — CBC WITH DIFFERENTIAL/PLATELET
BASOS PCT: 0 % (ref 0–1)
Basophils Absolute: 0 10*3/uL (ref 0.0–0.1)
EOS ABS: 0.3 10*3/uL (ref 0.0–0.7)
Eosinophils Relative: 4 % (ref 0–5)
HCT: 44.4 % (ref 39.0–52.0)
Hemoglobin: 15.1 g/dL (ref 13.0–17.0)
Lymphocytes Relative: 16 % (ref 12–46)
Lymphs Abs: 1.2 10*3/uL (ref 0.7–4.0)
MCH: 30.5 pg (ref 26.0–34.0)
MCHC: 34 g/dL (ref 30.0–36.0)
MCV: 89.7 fL (ref 78.0–100.0)
MONO ABS: 0.6 10*3/uL (ref 0.1–1.0)
MONOS PCT: 9 % (ref 3–12)
MPV: 11.3 fL (ref 8.6–12.4)
NEUTROS ABS: 5.1 10*3/uL (ref 1.7–7.7)
NEUTROS PCT: 71 % (ref 43–77)
PLATELETS: 293 10*3/uL (ref 150–400)
RBC: 4.95 MIL/uL (ref 4.22–5.81)
RDW: 13.8 % (ref 11.5–15.5)
WBC: 7.2 10*3/uL (ref 4.0–10.5)

## 2016-02-14 LAB — COMPREHENSIVE METABOLIC PANEL
ALT: 12 U/L (ref 9–46)
AST: 19 U/L (ref 10–35)
Albumin: 4.4 g/dL (ref 3.6–5.1)
Alkaline Phosphatase: 97 U/L (ref 40–115)
BILIRUBIN TOTAL: 0.5 mg/dL (ref 0.2–1.2)
BUN: 8 mg/dL (ref 7–25)
CHLORIDE: 98 mmol/L (ref 98–110)
CO2: 26 mmol/L (ref 20–31)
CREATININE: 0.61 mg/dL — AB (ref 0.70–1.25)
Calcium: 9.5 mg/dL (ref 8.6–10.3)
Glucose, Bld: 110 mg/dL — ABNORMAL HIGH (ref 65–99)
Potassium: 4.6 mmol/L (ref 3.5–5.3)
SODIUM: 134 mmol/L — AB (ref 135–146)
Total Protein: 6.9 g/dL (ref 6.1–8.1)

## 2016-02-14 NOTE — Assessment & Plan Note (Signed)
Patient doing quite well, BP controlled, eating well, taking vitamins, getting regular exercise. Generally asymptomatic from a GI standpoint. Until this point he said well compensated disease and he appears to continue and well compensated disease. He is currently due for updated labs which we will order today including CBC, CMP, PTT/INR, alpha-fetoprotein. Due for updated abdominal imaging approximately June this year, future order entered for scheduling. Return for follow-up in 6 months for routine cirrhosis care.

## 2016-02-14 NOTE — Patient Instructions (Addendum)
1. Have your labs drawn when you're able to. 2. We will schedule your ultrasound or you. 3. Return for follow-up in 6 months for routine care, or as needed for any changes in symptoms.

## 2016-02-14 NOTE — Progress Notes (Signed)
cc'ed to pcp °

## 2016-02-14 NOTE — Progress Notes (Signed)
Referring Provider: Celene Squibb, MD Primary Care Physician:  Wende Neighbors, MD Primary GI:  Dr. Oneida Alar  Chief Complaint  Patient presents with  . Follow-up    HPI:   Roy [name] is a 70 y.o. male who presents For follow-up routine cirrhosis care. He was last seen in our office 08/17/2015 with update his labs and imaging. Denied alcohol for over a year, generally asymptomatic from a GI standpoint, well compensated disease with a Meld score of 6 and a Child Pugh class A based on labs that day. AFP normal as well. All other labs essentially normal. Ultrasound of the abdomen completed 11/02/2015 with findings consistent with fatty infiltration and known cirrhosis, no concerning lesions. EGD up-to-date 03/05/2014 without mention of esophageal varices.  Today he states he's feeling great. He notes his BP is better and he is walking more now that the weather is more agreeable. Denies yellowing of skin/ankles, darkened urine, tremors, episodic acute confusion, LE edema, abdominal swelling, excessive bleeding/bruising. Still no ETOH (at this point has abstained for nearly 2 years). Denies hematochezia, melena, unintentional weight loss, acute changes in bowel habits, fever, chills. Denies chest pain, dyspnea, dizziness, lightheadedness, syncope, near syncope. Denies any other upper or lower GI symptoms.  Past Medical History  Diagnosis Date  . Cerebrovascular disease 2009    TIA; 2009- right ICA stent; re-intervention for restenosis complicated by [facility] w/o sx  . Degenerative joint disease     Total shoulder arthroplasty-right  . LBBB (left bundle branch block)     Normal echo-2011; stress nuclear in 09/2010--septal hypoperfusion representing nontransmural infarction or the effect of left bundle branch block, no ischemia  . Hyperlipidemia   . Hypertension   . Chronic obstructive pulmonary disease ([place])   . Tobacco abuse     -100 pack years; 1.5 packs per day  . Anxiety and depression   .  Alcohol abuse     6 beers per day; hospital admission in 2009 for withdrawal symptoms  . Thrombocytopenia ([place])   . Tubular adenoma of colon   . Myocardial infarction [facility]) June 02, 2014    Massive Heart Attack  . CHF (congestive heart failure) ([place]) 06-02-14  . Alcoholic cirrhosis ([place])   . Peripheral vascular disease ([place])   . AICD (automatic cardioverter/defibrillator) present 08/19/2015    St Jude BiV ICD for primary prevention by Dr. Lovena Le  . 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    Past Surgical History  Procedure Laterality Date  . Lumbar fusion  2010  . Vasectomy  1971  . Colonoscopy w/ polypectomy  2011  . Total shoulder arthroplasty Right 2011    Dr. Tamera Punt  . Colonoscopy  08/22/09    Fields-(Tubular Adenoma)3-mm transverse polyp/4-mm polyp otherwise noraml/small internal hemorrhoids  . Esophagogastroduodenoscopy [name] 03/05/2014    SLF: 1. Stricture at the gastroesophagael junction 2. Small hiatal hernia 3. Moderate non-erosive gastritis and duodentitis. 4. No surce for Melena identified.   . Agile capsule [name] 03/18/2014    Procedure: AGILE CAPSULE;  Surgeon: Danie Binder, MD;  [facility];  Service: Endoscopy;  Laterality: [name];  7:30  . Givens capsule study [name] 03/30/2014    Procedure: GIVENS CAPSULE STUDY;  Surgeon: Danie Binder, MD;  [facility];  Service: Endoscopy;  Laterality: [name];  7:30  . Colonoscopy [name] 04/14/2014    [id number] internal hemorrhids/normal mocsa in the terminal iluem/left colonis redundant  . Abdominal aortagram [name] 05/04/2014    Procedure: ABDOMINAL  Maxcine Ham;  Surgeon: [name], MD;  Location: [facility] CATH LAB;  Service: Cardiovascular;  Laterality: [name];  . Joint replacement Right   . Back surgery    . Cataract extraction w/phaco Right 03/08/2015    Procedure: CATARACT EXTRACTION PHACO AND INTRAOCULAR LENS PLACEMENT (IOC);  Surgeon: Rutherford Guys, MD;  Location: [facility] ORS;  Service: Ophthalmology;  Laterality: Right;   CDE:9.46  . Cataract extraction w/phaco Left 03/22/2015    Procedure: CATARACT EXTRACTION PHACO AND INTRAOCULAR LENS PLACEMENT (IOC);  Surgeon: Rutherford Guys, MD;  Location: [facility] ORS;  Service: Ophthalmology;  Laterality: Left;  CDE:5.80  . Bacterial overgrowth test [name] 05/24/2015    Procedure: BACTERIAL OVERGROWTH TEST;  Surgeon: Danie Binder, MD;  [facility];  Service: Endoscopy;  Laterality: [name];  0700  . Bi-ventricular implantable cardioverter defibrillator  (crt-d)  08/19/2015  . Ep implantable device [name] 08/19/2015    Procedure: BiV ICD Insertion CRT-D;  Surgeon: Evans Lance, MD;  Location: [place] CV LAB;  Service: Cardiovascular;  Laterality: [name];  . Carpal tunnel release Left 02/02/2016    Procedure: LEFT CARPAL TUNNEL RELEASE;  Surgeon: Daryll Brod, MD;  Location: [place];  Service: Orthopedics;  Laterality: Left;  ANESTHESIA: IV REGIONAL UPPER ARM  . Ulnar nerve transposition Left 02/02/2016    Procedure: LEFT IN-SITU DECOMPRESSION ULNAR NERVE ;  Surgeon: Daryll Brod, MD;  Location: [place];  Service: Orthopedics;  Laterality: Left;  . Ulnar tunnel release Left 02/02/2016    Procedure: LEFT CUBITAL TUNNEL RELEASE;  Surgeon: Daryll Brod, MD;  Location: [place];  Service: Orthopedics;  Laterality: Left;    Current Outpatient Prescriptions  Medication Sig Dispense Refill  . aspirin EC 81 MG tablet Take 81 mg by mouth every evening.     [name] atorvastatin (LIPITOR) 20 MG tablet Take 20 mg by mouth daily.     . carvedilol (COREG) 3.125 MG tablet Take 1 tablet (3.125 mg total) by mouth 2 (two) times daily. 180 tablet 3  . cetirizine (ZYRTEC) 10 MG tablet Take 10 mg by mouth daily.    . clopidogrel (PLAVIX) 75 MG tablet Take 75 mg by mouth every evening.     . cyanocobalamin 2000 MCG tablet Take 2,000 mcg by mouth daily.    [name] ENTRESTO 49-51 MG Take 1 tablet by mouth 2 (two) times daily. 99991111 tablet 3  . folic acid (FOLVITE) 1 MG  tablet Take 1 tablet (1 mg total) by mouth daily.    . furosemide (LASIX) 20 MG tablet Take 20 mg by mouth daily.     [name] gabapentin (NEURONTIN) 300 MG capsule Take 300 mg by mouth 3 (three) times daily.    [name] HYDROcodone-acetaminophen (NORCO) 5-325 MG tablet Take 1 tablet by mouth every 6 (six) hours as needed for moderate pain. 30 tablet 0  . HYDROcodone-acetaminophen (NORCO/VICODIN) 5-325 MG tablet Take one tab po q 4-6 hrs prn pain 15 tablet 0  . Iron-FA-B Cmp-C-Biot-Probiotic (FUSION PLUS) CAPS Take 1 capsule by mouth daily.     . Multiple Vitamins-Minerals (CENTRUM SILVER PO) Take 0.5 tablets by mouth 2 (two) times daily.     . pantoprazole (PROTONIX) 40 MG tablet TAKE ONE TABLET BY MOUTH PRIOR TO MEALS TWICE DAILY FOR 3 MONTHS THENDAILY THEREAFTER. 30 tablet 11  . PARoxetine (PAXIL) 20 MG tablet Take 20 mg by mouth daily.    . potassium chloride SA (K-DUR,KLOR-CON) 20 MEQ tablet Take 20 mEq by mouth daily.    [name]  Prenatal Vit-Fe Fumarate-FA (PRENATAL MULTIVITAMIN) TABS tablet Take 1 tablet by mouth daily at 12 noon.    . pyridOXINE (VITAMIN B-6) 100 MG tablet Take 100 mg by mouth daily.    . vitamin C (ASCORBIC ACID) 500 MG tablet Take 500 mg by mouth daily.     No current facility-administered medications for this visit.    Allergies as of 02/14/2016  . (No Known Allergies)    Family History  Problem Relation Age of Onset  . Prostate cancer Brother   . Coronary artery disease Mother   . Diabetes Mother   . Heart disease Mother     Before age 96 - 43 Bypasses  . Hypertension Mother   . Heart attack Mother     3-4 Heart attacks  . Alzheimer's disease Sister   . Alzheimer's disease Father   . Diabetes Father   . Colon cancer Neg Hx   . Heart attack Sister   . Cancer Brother     Prostate  . Hyperlipidemia Son     Social History   Social History  . Marital Status: Married    Spouse Name: [name]  . Number of Children: 2  . Years of Education: [name]   Occupational History  .  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     Social History Main Topics  . Smoking status: Current Every Day Smoker -- 1.00 packs/day for 60 years    Types: E-cigarettes, Cigarettes    Start date: 08/20/1957  . Smokeless tobacco: Never Used  . Alcohol Use: No     Comment: quit in July 2015; No ETOH to date 02/14/15  . Drug Use: No  . Sexual Activity: No   Other Topics Concern  . None   Social History Narrative   Accompanied by daughter [name]   Lives w/ wife, daughter    Review of Systems: 10-point ROS negative except as per HPI.   Physical Exam: BP 128/70 mmHg  Pulse 76  Temp(Src) 97.8 F (36.6 C) (Oral)  Ht 5\' 4"  (1.626 m)  Wt 153 lb 12.8 oz (69.763 kg)  BMI 26.39 kg/m2 General:   Alert and oriented. Pleasant and cooperative. Well-nourished and well-developed.  Head:  Normocephalic and atraumatic. Eyes:  Without icterus, sclera clear and conjunctiva pink.  Cardiovascular:  S1, S2 present without murmurs appreciated. Extremities without clubbing or edema. Respiratory:  Clear to auscultation bilaterally. No wheezes, rales, or rhonchi. No distress.  Gastrointestinal:  +BS, soft, non-tender and non-distended. No HSM noted. No guarding or rebound. No masses appreciated.  Rectal:  Deferred  Musculoskalatal:  Symmetrical without gross deformities. Skin:  Intact without significant lesions or rashes. Neurologic:  Alert and oriented x4;  grossly normal neurologically. Psych:  Alert and cooperative. Normal mood and affect. Heme/Lymph/Immune: No excessive bruising noted.    02/14/2016 8:14 AM   Disclaimer: This note was dictated with voice recognition software. Similar sounding words can inadvertently be transcribed and may not be corrected upon review.

## 2016-02-15 LAB — PROTIME-INR
INR: 1.06 (ref <1.50)
Prothrombin Time: 13.9 seconds (ref 11.6–15.2)

## 2016-02-15 LAB — AFP TUMOR MARKER: AFP-Tumor Marker: 1.4 ng/mL (ref <6.1)

## 2016-02-15 NOTE — Progress Notes (Signed)
Quick Note:  Please notify the patient that his labs look good overall. Tumor marker negative, INR normal, liver enzymes normal, CBC normal (including platelets). Keep plan for routine follow-up for cirrhosis.  MELD 7 Child-Pugh A ______

## 2016-02-16 NOTE — Progress Notes (Signed)
Quick Note:  Pt is aware of results. ______ 

## 2016-02-17 DIAGNOSIS — S62334A Displaced fracture of neck of fourth metacarpal bone, right hand, initial encounter for closed fracture: Secondary | ICD-10-CM | POA: Diagnosis not present

## 2016-02-17 DIAGNOSIS — M79641 Pain in right hand: Secondary | ICD-10-CM | POA: Diagnosis not present

## 2016-03-01 ENCOUNTER — Encounter: Payer: Medicare Other | Admitting: *Deleted

## 2016-03-01 ENCOUNTER — Telehealth: Payer: Self-pay | Admitting: Cardiology

## 2016-03-01 NOTE — Telephone Encounter (Signed)
LMOVM reminding pt to send remote transmission.   

## 2016-03-02 ENCOUNTER — Encounter: Payer: Self-pay | Admitting: Cardiology

## 2016-03-16 DIAGNOSIS — Z4889 Encounter for other specified surgical aftercare: Secondary | ICD-10-CM | POA: Diagnosis not present

## 2016-03-16 DIAGNOSIS — S62334A Displaced fracture of neck of fourth metacarpal bone, right hand, initial encounter for closed fracture: Secondary | ICD-10-CM | POA: Diagnosis not present

## 2016-03-16 DIAGNOSIS — S62334G Displaced fracture of neck of fourth metacarpal bone, right hand, subsequent encounter for fracture with delayed healing: Secondary | ICD-10-CM | POA: Diagnosis not present

## 2016-03-21 ENCOUNTER — Telehealth: Payer: Self-pay | Admitting: Orthopaedic Surgery

## 2016-03-21 ENCOUNTER — Telehealth: Payer: Self-pay | Admitting: Cardiology

## 2016-03-21 MED ORDER — NAPROXEN 500 MG PO TABS: 500.0000 mg | ORAL_TABLET | Freq: Two times a day (BID) | ORAL | Status: DC

## 2016-03-21 NOTE — Telephone Encounter (Signed)
Pt called into the office and stated that his home monitor isn't working. He stated that he has spoke w/ [employer] and they instructed him to call the office and have a Internet adapter ordered b/c cellular signal isn't strong in his area. Instructed pt that I would speak w/ the [employer] reps and have them order the Internet adapter. Pt verbalized understanding.

## 2016-03-21 NOTE — Telephone Encounter (Signed)
Routing to Dr. Keeling 

## 2016-03-21 NOTE — Telephone Encounter (Signed)
Rx Done . 

## 2016-03-21 NOTE — Telephone Encounter (Signed)
Voice message received from [place], PH# [phone] requesting refill on patient's prescription for Naproxen.  May have been prescribed through Dr Brooke Bonito previous medical records system.  Requested to call back using REF# [id number], or fax prescription refill to Fax# [phone]

## 2016-04-30 DIAGNOSIS — S62334D Displaced fracture of neck of fourth metacarpal bone, right hand, subsequent encounter for fracture with routine healing: Secondary | ICD-10-CM | POA: Diagnosis not present

## 2016-04-30 DIAGNOSIS — D2112 Benign neoplasm of connective and other soft tissue of left upper limb, including shoulder: Secondary | ICD-10-CM | POA: Insufficient documentation

## 2016-07-02 DIAGNOSIS — D2112 Benign neoplasm of connective and other soft tissue of left upper limb, including shoulder: Secondary | ICD-10-CM | POA: Diagnosis not present

## 2016-07-02 DIAGNOSIS — S62334D Displaced fracture of neck of fourth metacarpal bone, right hand, subsequent encounter for fracture with routine healing: Secondary | ICD-10-CM | POA: Diagnosis not present

## 2016-07-24 ENCOUNTER — Other Ambulatory Visit: Payer: Self-pay

## 2016-08-12 ENCOUNTER — Other Ambulatory Visit: Payer: Self-pay | Admitting: Gastroenterology

## 2016-08-16 ENCOUNTER — Encounter: Payer: Self-pay | Admitting: Gastroenterology

## 2016-08-16 ENCOUNTER — Encounter: Payer: Self-pay | Admitting: Nurse Practitioner

## 2016-08-16 ENCOUNTER — Ambulatory Visit (INDEPENDENT_AMBULATORY_CARE_PROVIDER_SITE_OTHER): Payer: Medicare Other | Admitting: Nurse Practitioner

## 2016-08-16 VITALS — BP 126/74 | HR 64 | Temp 97.9°F | Ht 63.0 in | Wt 148.4 lb

## 2016-08-16 DIAGNOSIS — K7030 Alcoholic cirrhosis of liver without ascites: Secondary | ICD-10-CM

## 2016-08-16 DIAGNOSIS — I70213 Atherosclerosis of native arteries of extremities with intermittent claudication, bilateral legs: Secondary | ICD-10-CM | POA: Diagnosis not present

## 2016-08-16 NOTE — Progress Notes (Signed)
Referring Provider: Celene Squibb, MD Primary Care Physician:  Wende Neighbors, MD Primary GI:  Dr. Oneida Alar  Chief Complaint  Patient presents with  . Follow-up    doing ok    HPI:   Roy [name] is a 70 y.o. male who presents For routine cirrhosis care. The patient was last seen in our office 02/14/2016 at which point he was doing well, generally asymptomatic from a GI standpoint. Has been abstinent of alcohol for 2 years. Routine labs and imaging were ordered which demonstrated well compensated disease with a meld score 7, child Pugh a. It does not appear his abdominal ultrasound was completed.  Today he states he's doing well. Denies abdominal pain, N/V, yellowing of skin/eyes, darkened urine, acute episodic confusion, LE edema, abdominal swelling. Denies chest pain, dyspnea, dizziness, lightheadedness, syncope, near syncope. Denies any other upper or lower GI symptoms.   Past Medical History:  Diagnosis Date  . AICD (automatic cardioverter/defibrillator) present 08/19/2015   [employer] Jude BiV ICD for primary prevention by Dr. Lovena Le  . Alcohol abuse    6 beers per day; hospital admission in 2009 for withdrawal symptoms  . Alcoholic cirrhosis ([place])   . Anxiety and depression   . Cerebrovascular disease 2009   TIA; 2009- right ICA stent; re-intervention for restenosis complicated by [facility] w/o sx  . CHF (congestive heart failure) ([place]) 06-02-14  . Chronic obstructive pulmonary disease ([place])   . Degenerative joint disease    Total shoulder arthroplasty-right  . Hyperlipidemia   . Hypertension   . LBBB (left bundle branch block)    Normal echo-2011; stress nuclear in 09/2010--septal hypoperfusion representing nontransmural infarction or the effect of left bundle branch block, no ischemia  . Myocardial infarction [facility]) June 02, 2014   Massive Heart Attack  . Peripheral vascular disease ([place])   . 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  . Thrombocytopenia ([place])   . Tobacco abuse      -100 pack years; 1.5 packs per day  . Tubular adenoma of colon     Past Surgical History:  Procedure Laterality Date  . ABDOMINAL AORTAGRAM [name] 05/04/2014   Procedure: ABDOMINAL Maxcine Ham;  Surgeon: [name], MD;  Location: [facility] CATH LAB;  Service: Cardiovascular;  Laterality: [name];  . AGILE CAPSULE [name] 03/18/2014   Procedure: AGILE CAPSULE;  Surgeon: Danie Binder, MD;  [facility];  Service: Endoscopy;  Laterality: [name];  7:30  . BACK SURGERY    . BACTERIAL OVERGROWTH TEST [name] 05/24/2015   Procedure: BACTERIAL OVERGROWTH TEST;  Surgeon: Danie Binder, MD;  [facility];  Service: Endoscopy;  Laterality: [name];  0700  . BI-VENTRICULAR IMPLANTABLE CARDIOVERTER DEFIBRILLATOR  (CRT-D)  08/19/2015  . CARPAL TUNNEL RELEASE Left 02/02/2016   Procedure: LEFT CARPAL TUNNEL RELEASE;  Surgeon: Daryll Brod, MD;  Location: [place];  Service: Orthopedics;  Laterality: Left;  ANESTHESIA: IV REGIONAL UPPER ARM  . CATARACT EXTRACTION W/PHACO Right 03/08/2015   Procedure: CATARACT EXTRACTION PHACO AND INTRAOCULAR LENS PLACEMENT (IOC);  Surgeon: Rutherford Guys, MD;  Location: [facility];  Service: Ophthalmology;  Laterality: Right;  CDE:9.46  . CATARACT EXTRACTION W/PHACO Left 03/22/2015   Procedure: CATARACT EXTRACTION PHACO AND INTRAOCULAR LENS PLACEMENT (IOC);  Surgeon: Rutherford Guys, MD;  Location: [facility];  Service: Ophthalmology;  Laterality: Left;  CDE:5.80  . COLONOSCOPY  08/22/09   Fields-(Tubular Adenoma)3-mm transverse polyp/4-mm polyp otherwise noraml/small internal hemorrhoids  . COLONOSCOPY [name] 04/14/2014   [id number] internal hemorrhids/normal  mocsa in the terminal iluem/left colonis redundant  . COLONOSCOPY W/ POLYPECTOMY  2011  . EP IMPLANTABLE DEVICE [name] 08/19/2015   Procedure: BiV ICD Insertion CRT-D;  Surgeon: Evans Lance, MD;  Location: [place] CV LAB;  Service: Cardiovascular;  Laterality: [name];  . ESOPHAGOGASTRODUODENOSCOPY [name] 03/05/2014   SLF: 1.  Stricture at the gastroesophagael junction 2. Small hiatal hernia 3. Moderate non-erosive gastritis and duodentitis. 4. No surce for Melena identified.   [name] GIVENS CAPSULE STUDY [name] 03/30/2014   Procedure: GIVENS CAPSULE STUDY;  Surgeon: Danie Binder, MD;  [facility];  Service: Endoscopy;  Laterality: [name];  7:30  . JOINT REPLACEMENT Right   . LUMBAR FUSION  2010  . TOTAL SHOULDER ARTHROPLASTY Right 2011   Dr. Tamera Punt  . ULNAR NERVE TRANSPOSITION Left 02/02/2016   Procedure: LEFT IN-SITU DECOMPRESSION ULNAR NERVE ;  Surgeon: Daryll Brod, MD;  Location: [place];  Service: Orthopedics;  Laterality: Left;  . ULNAR TUNNEL RELEASE Left 02/02/2016   Procedure: LEFT CUBITAL TUNNEL RELEASE;  Surgeon: Daryll Brod, MD;  Location: [place];  Service: Orthopedics;  Laterality: Left;  [name] VASECTOMY  1971    Current Outpatient Prescriptions  Medication Sig Dispense Refill  . aspirin EC 81 MG tablet Take 81 mg by mouth every evening.     [name] atorvastatin (LIPITOR) 20 MG tablet Take 20 mg by mouth daily.     . carvedilol (COREG) 3.125 MG tablet Take 1 tablet (3.125 mg total) by mouth 2 (two) times daily. 180 tablet 3  . cetirizine (ZYRTEC) 10 MG tablet Take 10 mg by mouth daily.    . clopidogrel (PLAVIX) 75 MG tablet Take 75 mg by mouth every evening.     . cyanocobalamin 2000 MCG tablet Take 2,000 mcg by mouth daily.    [name] ENTRESTO 49-51 MG Take 1 tablet by mouth 2 (two) times daily. 99991111 tablet 3  . folic acid (FOLVITE) 1 MG tablet Take 1 tablet (1 mg total) by mouth daily.    . furosemide (LASIX) 20 MG tablet Take 20 mg by mouth daily.     [name] gabapentin (NEURONTIN) 300 MG capsule Take 300 mg by mouth 3 (three) times daily.    [name] HYDROcodone-acetaminophen (NORCO) 5-325 MG tablet Take 1 tablet by mouth every 6 (six) hours as needed for moderate pain. 30 tablet 0  . HYDROcodone-acetaminophen (NORCO/VICODIN) 5-325 MG tablet Take one tab po q 4-6 hrs prn pain 15 tablet 0  .  Iron-FA-B Cmp-C-Biot-Probiotic (FUSION PLUS) CAPS Take 1 capsule by mouth daily.     . Multiple Vitamins-Minerals (CENTRUM SILVER PO) Take 0.5 tablets by mouth 2 (two) times daily.     . naproxen (NAPROSYN) 500 MG tablet Take 1 tablet (500 mg total) by mouth 2 (two) times daily with a meal. (Patient taking differently: Take 500 mg by mouth as needed. ) 60 tablet 5  . pantoprazole (PROTONIX) 40 MG tablet TAKE 1 TABLET DAILY 90 tablet 3  . PARoxetine (PAXIL) 20 MG tablet Take 20 mg by mouth daily.    . potassium chloride SA (K-DUR,KLOR-CON) 20 MEQ tablet Take 20 mEq by mouth daily.    . Prenatal Vit-Fe Fumarate-FA (PRENATAL MULTIVITAMIN) TABS tablet Take 1 tablet by mouth daily at 12 noon.    . pyridOXINE (VITAMIN B-6) 100 MG tablet Take 100 mg by mouth daily.    . vitamin C (ASCORBIC ACID) 500 MG tablet Take 500 mg by mouth daily.  No current facility-administered medications for this visit.     Allergies as of 08/16/2016  . (No Known Allergies)    Family History  Problem Relation Age of Onset  . Coronary artery disease Mother   . Diabetes Mother   . Heart disease Mother     Before age 54 - 8 Bypasses  . Hypertension Mother   . Heart attack Mother     3-4 Heart attacks  . Alzheimer's disease Father   . Diabetes Father   . Heart attack Sister   . Cancer Brother     Prostate  . Hyperlipidemia Son   . Prostate cancer Brother   . Alzheimer's disease Sister   . Colon cancer Neg Hx     Social History   Social History  . Marital status: Married    Spouse name: [name]  . Number of children: 2  . Years of education: [name]   Occupational History  . 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  Retired   Social History Main Topics  . Smoking status: Current Every Day Smoker    Packs/day: 1.00    Years: 60.00    Types: E-cigarettes, Cigarettes    Start date: 08/20/1957  . Smokeless tobacco: Never Used  . Alcohol use No     Comment: quit in July 2015; No ETOH to date 02/14/15  .  Drug use: No  . Sexual activity: No   Other Topics Concern  . None   Social History Narrative   Accompanied by daughter [name]   Lives w/ wife, daughter    Review of Systems: 10-point ROS negative except as per HPI.   Physical Exam: BP 126/74   Pulse 64   Temp 97.9 F (36.6 C) (Oral)   Ht 5\' 3"  (1.6 m)   Wt 148 lb 6.4 oz (67.3 kg)   BMI 26.29 kg/m  General:   Alert and oriented. Pleasant and cooperative. Well-nourished and well-developed.  Eyes:  Without icterus, sclera clear and conjunctiva pink.  Ears:  Normal auditory acuity. Cardiovascular:  S1, S2 present without murmurs appreciated. Extremities without clubbing or edema. Respiratory:  Clear to auscultation bilaterally. No wheezes, rales, or rhonchi. No distress.  Gastrointestinal:  +BS, soft, non-tender and non-distended. No HSM noted. No guarding or rebound. No masses appreciated.  Rectal:  Deferred  Musculoskalatal:  Symmetrical without gross deformities. Neurologic:  Alert and oriented x4;  grossly normal neurologically. Psych:  Alert and cooperative. Normal mood and affect. Heme/Lymph/Immune: No excessive bruising noted.    08/16/2016 8:32 AM   Disclaimer: This note was dictated with voice recognition software. Similar sounding words can inadvertently be transcribed and may not be corrected upon review.

## 2016-08-16 NOTE — Addendum Note (Signed)
Addended by: Gordy Levan, Carlisa Eble A on: 08/16/2016 08:48 AM   Modules accepted: Orders

## 2016-08-16 NOTE — Patient Instructions (Signed)
1. Have your labs drawn when you're able to. 2. We will help you schedule your ultrasound. 3. Return for follow-up in 6 months.

## 2016-08-16 NOTE — Assessment & Plan Note (Signed)
Well compensated disease this point. Has abstained from alcohol for 2 years. Doing well, generally asymptomatic from a hepatic standpoint. We will order routine labs and right upper quadrant ultrasound for [facility] surveillance. Return for follow-up in 6 months.

## 2016-08-16 NOTE — Progress Notes (Signed)
cc'ed to pcp °

## 2016-08-17 ENCOUNTER — Telehealth: Payer: Self-pay

## 2016-08-17 NOTE — Telephone Encounter (Signed)
Pt set up for [place] for Tuesday 08/21/16 @ 9:15 am. Tried to call with no answer

## 2016-08-20 DIAGNOSIS — K7030 Alcoholic cirrhosis of liver without ascites: Secondary | ICD-10-CM | POA: Diagnosis not present

## 2016-08-20 LAB — CBC WITH DIFFERENTIAL/PLATELET
BASOS ABS: 0 {cells}/uL (ref 0–200)
Basophils Relative: 0 %
EOS PCT: 8 %
Eosinophils Absolute: 536 cells/uL — ABNORMAL HIGH (ref 15–500)
HCT: 41.6 % (ref 38.5–50.0)
HEMOGLOBIN: 14.2 g/dL (ref 13.2–17.1)
LYMPHS PCT: 28 %
Lymphs Abs: 1876 cells/uL (ref 850–3900)
MCH: 31.1 pg (ref 27.0–33.0)
MCHC: 34.1 g/dL (ref 32.0–36.0)
MCV: 91.2 fL (ref 80.0–100.0)
MONOS PCT: 10 %
MPV: 10.7 fL (ref 7.5–12.5)
Monocytes Absolute: 670 cells/uL (ref 200–950)
NEUTROS PCT: 54 %
Neutro Abs: 3618 cells/uL (ref 1500–7800)
PLATELETS: 274 10*3/uL (ref 140–400)
RBC: 4.56 MIL/uL (ref 4.20–5.80)
RDW: 12.9 % (ref 11.0–15.0)
WBC: 6.7 10*3/uL (ref 3.8–10.8)

## 2016-08-20 LAB — COMPREHENSIVE METABOLIC PANEL
ALBUMIN: 4 g/dL (ref 3.6–5.1)
ALT: 9 U/L (ref 9–46)
AST: 16 U/L (ref 10–35)
Alkaline Phosphatase: 68 U/L (ref 40–115)
BUN: 13 mg/dL (ref 7–25)
CHLORIDE: 102 mmol/L (ref 98–110)
CO2: 27 mmol/L (ref 20–31)
CREATININE: 0.62 mg/dL — AB (ref 0.70–1.25)
Calcium: 9.1 mg/dL (ref 8.6–10.3)
GLUCOSE: 89 mg/dL (ref 65–99)
Potassium: 4.3 mmol/L (ref 3.5–5.3)
SODIUM: 134 mmol/L — AB (ref 135–146)
Total Bilirubin: 0.3 mg/dL (ref 0.2–1.2)
Total Protein: 6.3 g/dL (ref 6.1–8.1)

## 2016-08-21 ENCOUNTER — Ambulatory Visit (HOSPITAL_COMMUNITY): Admission: RE | Admit: 2016-08-21 | Payer: Medicare Other | Source: Ambulatory Visit

## 2016-08-21 LAB — PROTIME-INR
INR: 1
Prothrombin Time: 11 s (ref 9.0–11.5)

## 2016-08-21 LAB — AFP TUMOR MARKER: AFP-Tumor Marker: 1.5 ng/mL (ref <6.1)

## 2016-08-24 NOTE — Progress Notes (Signed)
Pt is aware of results. 

## 2016-09-02 ENCOUNTER — Other Ambulatory Visit: Payer: Self-pay | Admitting: Cardiovascular Disease

## 2016-09-13 DIAGNOSIS — D509 Iron deficiency anemia, unspecified: Secondary | ICD-10-CM | POA: Diagnosis not present

## 2016-09-13 DIAGNOSIS — I10 Essential (primary) hypertension: Secondary | ICD-10-CM | POA: Diagnosis not present

## 2016-09-13 DIAGNOSIS — E782 Mixed hyperlipidemia: Secondary | ICD-10-CM | POA: Diagnosis not present

## 2016-09-13 DIAGNOSIS — Z6826 Body mass index (BMI) 26.0-26.9, adult: Secondary | ICD-10-CM | POA: Diagnosis not present

## 2016-09-13 DIAGNOSIS — Z950 Presence of cardiac pacemaker: Secondary | ICD-10-CM | POA: Diagnosis not present

## 2016-09-13 DIAGNOSIS — M545 Low back pain: Secondary | ICD-10-CM | POA: Diagnosis not present

## 2016-09-17 ENCOUNTER — Ambulatory Visit (INDEPENDENT_AMBULATORY_CARE_PROVIDER_SITE_OTHER): Payer: Medicare Other | Admitting: Internal Medicine

## 2016-09-17 ENCOUNTER — Encounter: Payer: Self-pay | Admitting: Internal Medicine

## 2016-09-17 VITALS — BP 140/70 | HR 78 | Ht 63.0 in | Wt 151.8 lb

## 2016-09-17 DIAGNOSIS — I5022 Chronic systolic (congestive) heart failure: Secondary | ICD-10-CM | POA: Diagnosis not present

## 2016-09-17 DIAGNOSIS — Z950 Presence of cardiac pacemaker: Secondary | ICD-10-CM | POA: Diagnosis not present

## 2016-09-17 DIAGNOSIS — I70213 Atherosclerosis of native arteries of extremities with intermittent claudication, bilateral legs: Secondary | ICD-10-CM | POA: Diagnosis not present

## 2016-09-17 LAB — CUP PACEART INCLINIC DEVICE CHECK
Brady Statistic RA Percent Paced: 28 %
Brady Statistic RV Percent Paced: 99.04 %
HighPow Impedance: 81 Ohm
Implantable Lead Implant Date: 20160923
Implantable Lead Location: 753858
Implantable Lead Location: 753859
Implantable Lead Location: 753860
Lead Channel Impedance Value: 525 Ohm
Lead Channel Pacing Threshold Amplitude: 0.75 V
Lead Channel Pacing Threshold Amplitude: 1 V
Lead Channel Pacing Threshold Pulse Width: 0.5 ms
Lead Channel Pacing Threshold Pulse Width: 0.5 ms
Lead Channel Setting Pacing Amplitude: 2 V
Lead Channel Setting Pacing Pulse Width: 0.5 ms
Lead Channel Setting Pacing Pulse Width: 0.8 ms
MDC IDC LEAD IMPLANT DT: 20160923
MDC IDC LEAD IMPLANT DT: 20160923
MDC IDC LEAD MODEL: 7122
MDC IDC MSMT BATTERY REMAINING LONGEVITY: 69.6
MDC IDC MSMT LEADCHNL LV IMPEDANCE VALUE: 437.5 Ohm
MDC IDC MSMT LEADCHNL LV PACING THRESHOLD PULSEWIDTH: 0.8 ms
MDC IDC MSMT LEADCHNL RA PACING THRESHOLD AMPLITUDE: 0.75 V
MDC IDC MSMT LEADCHNL RA SENSING INTR AMPL: 5 mV
MDC IDC MSMT LEADCHNL RV IMPEDANCE VALUE: 750 Ohm
MDC IDC MSMT LEADCHNL RV SENSING INTR AMPL: 12 mV
MDC IDC PG SERIAL: 7294918
MDC IDC SESS DTM: 20171023094008
MDC IDC SET LEADCHNL LV PACING AMPLITUDE: 2.25 V
MDC IDC SET LEADCHNL RA PACING AMPLITUDE: 2 V
MDC IDC SET LEADCHNL RV SENSING SENSITIVITY: 0.5 mV

## 2016-09-17 NOTE — Patient Instructions (Signed)
Your physician wants you to follow-up in: 1 Year with Dr.Taylor. You will receive a reminder letter in the mail two months in advance. If you don't receive a letter, please call our office to schedule the follow-up appointment.  Remote monitoring is used to monitor your Pacemaker of ICD from home. This monitoring reduces the number of office visits required to check your device to one time per year. It allows [place] to keep an eye on the functioning of your device to ensure it is working properly. You are scheduled for a device check from home on 12/18/15. You may send your transmission at any time that day. If you have a wireless device, the transmission will be sent automatically. After your physician reviews your transmission, you will receive a postcard with your next transmission date.  Your physician recommends that you continue on your current medications as directed. Please refer to the Current Medication list given to you today.  If you need a refill on your cardiac medications before your next appointment, please call your pharmacy.  Thank you for choosing [facility]!

## 2016-09-17 NOTE — Progress Notes (Signed)
HPI Roy [name] is returns today for ICD followup. He is a pleasant 70 yo man with chronic systolic heart failure, HTN, COPD, and CAD, s/p MI. He has LBBB and underwent BiV ICD implant about12 months ago. In the interim, he has been stable. He is bothered most by claudication. He is still smoking but has reduced his amount of smoking. No Known Allergies   Current Outpatient Prescriptions  Medication Sig Dispense Refill  . aspirin EC 81 MG tablet Take 81 mg by mouth every evening.     [name] atorvastatin (LIPITOR) 20 MG tablet Take 20 mg by mouth daily.     . carvedilol (COREG) 3.125 MG tablet Take 1 tablet (3.125 mg total) by mouth 2 (two) times daily. 180 tablet 3  . cetirizine (ZYRTEC) 10 MG tablet Take 10 mg by mouth daily.    . clopidogrel (PLAVIX) 75 MG tablet Take 75 mg by mouth every evening.     . cyanocobalamin 2000 MCG tablet Take 2,000 mcg by mouth daily.    [name] ENTRESTO 49-51 MG TAKE 1 TABLET TWICE A DAY 99991111 tablet 3  . folic acid (FOLVITE) 1 MG tablet Take 1 tablet (1 mg total) by mouth daily.    . furosemide (LASIX) 20 MG tablet Take 20 mg by mouth daily.     . Iron-FA-B Cmp-C-Biot-Probiotic (FUSION PLUS) CAPS Take 1 capsule by mouth daily.     . Multiple Vitamins-Minerals (CENTRUM SILVER PO) Take 0.5 tablets by mouth 2 (two) times daily.     . naproxen (NAPROSYN) 500 MG tablet Take 1 tablet (500 mg total) by mouth 2 (two) times daily with a meal. (Patient taking differently: Take 500 mg by mouth as needed. ) 60 tablet 5  . pantoprazole (PROTONIX) 40 MG tablet TAKE 1 TABLET DAILY 90 tablet 3  . PARoxetine (PAXIL) 20 MG tablet Take 20 mg by mouth daily.    . potassium chloride SA (K-DUR,KLOR-CON) 20 MEQ tablet Take 20 mEq by mouth daily.    [name] pyridOXINE (VITAMIN B-6) 100 MG tablet Take 100 mg by mouth daily.    . vitamin C (ASCORBIC ACID) 500 MG tablet Take 500 mg by mouth daily.     No current facility-administered medications for this visit.      Past Medical History:    Diagnosis Date  . AICD (automatic cardioverter/defibrillator) present 08/19/2015   St Jude BiV ICD for primary prevention by Dr. Lovena Le  . Alcohol abuse    6 beers per day; hospital admission in 2009 for withdrawal symptoms  . Alcoholic cirrhosis ([place])   . Anxiety and depression   . Cerebrovascular disease 2009   TIA; 2009- right ICA stent; re-intervention for restenosis complicated by [facility] w/o sx  . CHF (congestive heart failure) ([place]) 06-02-14  . Chronic obstructive pulmonary disease ([place])   . Degenerative joint disease    Total shoulder arthroplasty-right  . Hyperlipidemia   . Hypertension   . LBBB (left bundle branch block)    Normal echo-2011; stress nuclear in 09/2010--septal hypoperfusion representing nontransmural infarction or the effect of left bundle branch block, no ischemia  . Myocardial infarction June 02, 2014   Massive Heart Attack  . Peripheral vascular disease ([place])   . 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  . Thrombocytopenia ([place])   . Tobacco abuse    -100 pack years; 1.5 packs per day  . Tubular adenoma of colon     ROS:   All systems reviewed and negative except  as noted in the HPI.   Past Surgical History:  Procedure Laterality Date  . ABDOMINAL AORTAGRAM [name] 05/04/2014   Procedure: ABDOMINAL Maxcine Ham;  Surgeon: [name], MD;  Location: [facility] CATH LAB;  Service: Cardiovascular;  Laterality: [name];  . AGILE CAPSULE [name] 03/18/2014   Procedure: AGILE CAPSULE;  Surgeon: Danie Binder, MD;  [facility];  Service: Endoscopy;  Laterality: [name];  7:30  . BACK SURGERY    . BACTERIAL OVERGROWTH TEST [name] 05/24/2015   Procedure: BACTERIAL OVERGROWTH TEST;  Surgeon: Danie Binder, MD;  [facility];  Service: Endoscopy;  Laterality: [name];  0700  . BI-VENTRICULAR IMPLANTABLE CARDIOVERTER DEFIBRILLATOR  (CRT-D)  08/19/2015  . CARPAL TUNNEL RELEASE Left 02/02/2016   Procedure: LEFT CARPAL TUNNEL RELEASE;  Surgeon: Daryll Brod, MD;   Location: [facility];  Service: Orthopedics;  Laterality: Left;  ANESTHESIA: IV REGIONAL UPPER ARM  . CATARACT EXTRACTION W/PHACO Right 03/08/2015   Procedure: CATARACT EXTRACTION PHACO AND INTRAOCULAR LENS PLACEMENT (IOC);  Surgeon: Rutherford Guys, MD;  Location: [facility] ORS;  Service: Ophthalmology;  Laterality: Right;  CDE:9.46  . CATARACT EXTRACTION W/PHACO Left 03/22/2015   Procedure: CATARACT EXTRACTION PHACO AND INTRAOCULAR LENS PLACEMENT (IOC);  Surgeon: Rutherford Guys, MD;  Location: [facility] ORS;  Service: Ophthalmology;  Laterality: Left;  CDE:5.80  . COLONOSCOPY  08/22/09   Fields-(Tubular Adenoma)3-mm transverse polyp/4-mm polyp otherwise noraml/small internal hemorrhoids  . COLONOSCOPY [name] 04/14/2014   [id number] internal hemorrhids/normal mocsa in the terminal iluem/left colonis redundant  . COLONOSCOPY W/ POLYPECTOMY  2011  . EP IMPLANTABLE DEVICE [name] 08/19/2015   Procedure: BiV ICD Insertion CRT-D;  Surgeon: Evans Lance, MD;  Location: [place] CV LAB;  Service: Cardiovascular;  Laterality: [name];  . ESOPHAGOGASTRODUODENOSCOPY [name] 03/05/2014   SLF: 1. Stricture at the gastroesophagael junction 2. Small hiatal hernia 3. Moderate non-erosive gastritis and duodentitis. 4. No surce for Melena identified.   [name] GIVENS CAPSULE STUDY [name] 03/30/2014   Procedure: GIVENS CAPSULE STUDY;  Surgeon: Danie Binder, MD;  [facility];  Service: Endoscopy;  Laterality: [name];  7:30  . JOINT REPLACEMENT Right   . LUMBAR FUSION  2010  . TOTAL SHOULDER ARTHROPLASTY Right 2011   Dr. Tamera Punt  . ULNAR NERVE TRANSPOSITION Left 02/02/2016   Procedure: LEFT IN-SITU DECOMPRESSION ULNAR NERVE ;  Surgeon: Daryll Brod, MD;  Location: [facility];  Service: Orthopedics;  Laterality: Left;  . ULNAR TUNNEL RELEASE Left 02/02/2016   Procedure: LEFT CUBITAL TUNNEL RELEASE;  Surgeon: Daryll Brod, MD;  Location: [place];  Service: Orthopedics;  Laterality: Left;  [name] VASECTOMY  1971      Family History  Problem Relation Age of Onset  . Coronary artery disease Mother   . Diabetes Mother   . Heart disease Mother     Before age 16 - 60 Bypasses  . Hypertension Mother   . Heart attack Mother     3-4 Heart attacks  . Alzheimer's disease Father   . Diabetes Father   . Heart attack Sister   . Cancer Brother     Prostate  . Hyperlipidemia Son   . Prostate cancer Brother   . Alzheimer's disease Sister   . Colon cancer Neg Hx      Social History   Social History  . Marital status: Married    Spouse name: [name]  . Number of children: 2  . Years of education: [name]   Occupational History  .  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  Retired   Social History Main Topics  . Smoking status: Current Every Day Smoker    Packs/day: 1.00    Years: 60.00    Types: E-cigarettes, Cigarettes    Start date: 08/20/1957  . Smokeless tobacco: Never Used  . Alcohol use No     Comment: quit in July 2015; No ETOH to date 02/14/15  . Drug use: No  . Sexual activity: No   Other Topics Concern  . Not on file   Social History Narrative   Accompanied by daughter [name]   Lives w/ wife, daughter     BP 140/70   Pulse 78   Ht 5\' 3"  (1.6 m)   Wt 151 lb 12.8 oz (68.9 kg)   SpO2 98%   BMI 26.89 kg/m   Physical Exam:  Well appearing 70 yo man, NAD, diskempt but well appearing Neck:  7 cm, no thyromegally Lymphatics:  No adenopathy Back:  No CVA tenderness Lungs:  Clear with no wheezes, well healed ICD incision. HEART:  Regular rate rhythm, no murmurs, no rubs, no clicks Abd:  soft, positive bowel sounds, no organomegally, no rebound, no guarding Ext:  2 plus pulses, no edema, no cyanosis, no clubbing Skin:  No rashes no nodules Neuro:  CN II through XII intact, motor grossly intact  ICD - normal biV ICD function. We reprogrammed from D1-M2 to M2 to coil with improved threshold  Assess/Plan: 1. Chronic systolic heart failure - his symptoms are class 2 and  he is encouraged to keep up his physical activity and maintain a low sodium diet. 2. ICD - we reprogrammed his device today to improve efficiency 3. Peripheral vascular disease - he is limited by claudication. I have asked him to stop smoking, and encouraged him to continue his moderate physical activity and notify us if he develops any sores on his feet.  Mikle Bosworth.D.

## 2016-09-30 ENCOUNTER — Other Ambulatory Visit: Payer: Self-pay | Admitting: Cardiovascular Disease

## 2016-10-01 ENCOUNTER — Other Ambulatory Visit: Payer: Self-pay

## 2016-10-01 MED ORDER — CARVEDILOL 3.125 MG PO TABS: 3.1250 mg | ORAL_TABLET | Freq: Two times a day (BID) | ORAL | 3 refills | Status: DC

## 2016-10-01 NOTE — Telephone Encounter (Signed)
Refill complete 

## 2016-12-04 DIAGNOSIS — K7460 Unspecified cirrhosis of liver: Secondary | ICD-10-CM | POA: Diagnosis not present

## 2016-12-04 DIAGNOSIS — I10 Essential (primary) hypertension: Secondary | ICD-10-CM | POA: Diagnosis not present

## 2016-12-04 DIAGNOSIS — Z125 Encounter for screening for malignant neoplasm of prostate: Secondary | ICD-10-CM | POA: Diagnosis not present

## 2016-12-04 DIAGNOSIS — E782 Mixed hyperlipidemia: Secondary | ICD-10-CM | POA: Diagnosis not present

## 2016-12-04 DIAGNOSIS — M545 Low back pain: Secondary | ICD-10-CM | POA: Diagnosis not present

## 2016-12-04 DIAGNOSIS — F5101 Primary insomnia: Secondary | ICD-10-CM | POA: Diagnosis not present

## 2016-12-04 DIAGNOSIS — R7301 Impaired fasting glucose: Secondary | ICD-10-CM | POA: Diagnosis not present

## 2016-12-04 DIAGNOSIS — D509 Iron deficiency anemia, unspecified: Secondary | ICD-10-CM | POA: Diagnosis not present

## 2016-12-17 ENCOUNTER — Encounter: Payer: Medicare Other | Admitting: *Deleted

## 2016-12-17 ENCOUNTER — Telehealth: Payer: Self-pay | Admitting: Cardiology

## 2016-12-17 NOTE — Telephone Encounter (Signed)
LMOVM reminding pt to send remote transmission.   

## 2016-12-21 ENCOUNTER — Encounter: Payer: Self-pay | Admitting: Cardiology

## 2016-12-24 ENCOUNTER — Encounter: Payer: Self-pay | Admitting: Family

## 2016-12-31 ENCOUNTER — Ambulatory Visit (HOSPITAL_COMMUNITY)
Admission: RE | Admit: 2016-12-31 | Discharge: 2016-12-31 | Disposition: A | Discharge status: Home / Self Care | Payer: Medicare Other | Source: Ambulatory Visit | Attending: Family | Admitting: Family

## 2016-12-31 ENCOUNTER — Ambulatory Visit (INDEPENDENT_AMBULATORY_CARE_PROVIDER_SITE_OTHER): Payer: Medicare Other | Admitting: Family

## 2016-12-31 ENCOUNTER — Ambulatory Visit (INDEPENDENT_AMBULATORY_CARE_PROVIDER_SITE_OTHER): Payer: Medicare Other | Admitting: *Deleted

## 2016-12-31 ENCOUNTER — Encounter: Payer: Self-pay | Admitting: Family

## 2016-12-31 VITALS — BP 149/84 | HR 67 | Temp 97.1°F | Resp 16 | Ht 63.0 in | Wt 158.0 lb

## 2016-12-31 DIAGNOSIS — F17200 Nicotine dependence, unspecified, uncomplicated: Secondary | ICD-10-CM

## 2016-12-31 DIAGNOSIS — R0989 Other specified symptoms and signs involving the circulatory and respiratory systems: Secondary | ICD-10-CM

## 2016-12-31 DIAGNOSIS — I255 Ischemic cardiomyopathy: Secondary | ICD-10-CM

## 2016-12-31 DIAGNOSIS — I70213 Atherosclerosis of native arteries of extremities with intermittent claudication, bilateral legs: Secondary | ICD-10-CM | POA: Insufficient documentation

## 2016-12-31 DIAGNOSIS — Z95828 Presence of other vascular implants and grafts: Secondary | ICD-10-CM

## 2016-12-31 NOTE — Progress Notes (Signed)
[facility] [place]   CC: Follow up peripheral artery occlusive disease  History of Present Illness Roy [name] is a 71 y.o. male patient of Dr. [name] who returns today for followup.He is status post bilateral iliac stenting on 05/04/2014. This was done for lifestyle limiting claudication. He has bilateral calf claudication after walking about 1/4 mile, relieved by rest, he states this is no worse than 6 months ago at his last visit. Pt denies non healing wounds.  He has had a lumbar fusion, states he has low back pain, denies radiculopathy pain, he was taking gabapentin for this, ran out; a couple months after this he had pain, then resumed for the last month and the pain has abated in his buttocks, thighs, calves. He reports that he has had a TIA in 2010, had a stent placed in his brain in 2009, states Dr. Estanislado Pandy checks [place] of his neck; had an MI July 2015.  He had a pacemaker /ICD placed 08/19/15 by Dr. Lovena Le.  Pt Diabetic: No Pt smoker: smoker (1/2 ppd, started at age 64 yrs)  Pt meds include: Statin :Yes Betablocker: Yes ASA: Yes Other anticoagulants/antiplatelets: Plavix    Past Medical History:  Diagnosis Date  . AICD (automatic cardioverter/defibrillator) present 08/19/2015   St Jude BiV ICD for primary prevention by Dr. Lovena Le  . Alcohol abuse    6 beers per day; hospital admission in 2009 for withdrawal symptoms  . Alcoholic cirrhosis ([place])   . Anxiety and depression   . Cerebrovascular disease 2009   TIA; 2009- right ICA stent; re-intervention for restenosis complicated by [facility] w/o sx  . CHF (congestive heart failure) ([place]) 06-02-14  . Chronic obstructive pulmonary disease ([place])   . Degenerative joint disease    Total shoulder arthroplasty-right  . Hyperlipidemia   . Hypertension   . LBBB (left bundle branch block)    Normal echo-2011; stress nuclear in 09/2010--septal hypoperfusion representing nontransmural infarction or the  effect of left bundle branch block, no ischemia  . Myocardial infarction June 02, 2014   Massive Heart Attack  . Peripheral vascular disease ([place])   . 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  . Thrombocytopenia ([place])   . Tobacco abuse    -100 pack years; 1.5 packs per day  . Tubular adenoma of colon     Social History Social History  Substance Use Topics  . Smoking status: Current Every Day Smoker    Packs/day: 1.00    Years: 60.00    Types: E-cigarettes, Cigarettes    Start date: 08/20/1957  . Smokeless tobacco: Never Used  . Alcohol use No     Comment: quit in July 2015; No ETOH to date 02/14/15    Family History Family History  Problem Relation Age of Onset  . Coronary artery disease Mother   . Diabetes Mother   . Heart disease Mother     Before age 32 - 26 Bypasses  . Hypertension Mother   . Heart attack Mother     3-4 Heart attacks  . Alzheimer's disease Father   . Diabetes Father   . Heart attack Sister   . Cancer Brother     Prostate  . Hyperlipidemia Son   . Prostate cancer Brother   . Alzheimer's disease Sister   . Colon cancer Neg Hx     Past Surgical History:  Procedure Laterality Date  . ABDOMINAL AORTAGRAM [name] 05/04/2014   Procedure: ABDOMINAL Maxcine Ham;  Surgeon: [name], MD;  Location: [facility] CATH LAB;  Service: Cardiovascular;  Laterality: [name];  . AGILE CAPSULE [name] 03/18/2014   Procedure: AGILE CAPSULE;  Surgeon: Danie Binder, MD;  [facility];  Service: Endoscopy;  Laterality: [name];  7:30  . BACK SURGERY    . BACTERIAL OVERGROWTH TEST [name] 05/24/2015   Procedure: BACTERIAL OVERGROWTH TEST;  Surgeon: Danie Binder, MD;  [facility];  Service: Endoscopy;  Laterality: [name];  0700  . BI-VENTRICULAR IMPLANTABLE CARDIOVERTER DEFIBRILLATOR  (CRT-D)  08/19/2015  . CARPAL TUNNEL RELEASE Left 02/02/2016   Procedure: LEFT CARPAL TUNNEL RELEASE;  Surgeon: Daryll Brod, MD;  Location: [facility];  Service:  Orthopedics;  Laterality: Left;  ANESTHESIA: IV REGIONAL UPPER ARM  . CATARACT EXTRACTION W/PHACO Right 03/08/2015   Procedure: CATARACT EXTRACTION PHACO AND INTRAOCULAR LENS PLACEMENT (IOC);  Surgeon: Rutherford Guys, MD;  Location: [facility] OR[facility];  Service: Ophthalmology;  Laterality: Right;  CDE:9.46  . CATARACT EXTRACTION W/PHACO Left 03/22/2015   Procedure: CATARACT EXTRACTION PHACO AND INTRAOCULAR LENS PLACEMENT (IOC);  Surgeon: Rutherford Guys, MD;  Location: [facility] OR[facility];  Service: Ophthalmology;  Laterality: Left;  CDE:5.80  . COLONOSCOPY  08/22/09   Fields-(Tubular Adenoma)3-mm transverse polyp/4-mm polyp otherwise noraml/small internal hemorrhoids  . COLONOSCOPY [name] 04/14/2014   [id number] internal hemorrhids/normal mocsa in the terminal iluem/left colonis redundant  . COLONOSCOPY W/ POLYPECTOMY  2011  . EP IMPLANTABLE DEVICE [name] 08/19/2015   Procedure: BiV ICD Insertion CRT-D;  Surgeon: Evans Lance, MD;  Location: [place] CV LAB;  Service: Cardiovascular;  Laterality: [name];  . ESOPHAGOGASTRODUODENOSCOPY [name] 03/05/2014   SLF: 1. Stricture at the gastroesophagael junction 2. Small hiatal hernia 3. Moderate non-erosive gastritis and duodentitis. 4. No surce for Melena identified.   [name] GIVENS CAPSULE STUDY [name] 03/30/2014   Procedure: GIVENS CAPSULE STUDY;  Surgeon: Danie Binder, MD;  [facility];  Service: Endoscopy;  Laterality: [name];  7:30  . JOINT REPLACEMENT Right   . LUMBAR FUSION  2010  . TOTAL SHOULDER ARTHROPLASTY Right 2011   Dr. Tamera Punt  . ULNAR NERVE TRANSPOSITION Left 02/02/2016   Procedure: LEFT IN-SITU DECOMPRESSION ULNAR NERVE ;  Surgeon: Daryll Brod, MD;  Location: [place];  Service: Orthopedics;  Laterality: Left;  . ULNAR TUNNEL RELEASE Left 02/02/2016   Procedure: LEFT CUBITAL TUNNEL RELEASE;  Surgeon: Daryll Brod, MD;  Location: [place];  Service: Orthopedics;  Laterality: Left;  [name] VASECTOMY  1971    No Known Allergies  Current Outpatient  Prescriptions  Medication Sig Dispense Refill  . aspirin EC 81 MG tablet Take 81 mg by mouth every evening.     [name] atorvastatin (LIPITOR) 20 MG tablet Take 20 mg by mouth daily.     . carvedilol (COREG) 3.125 MG tablet Take 1 tablet (3.125 mg total) by mouth 2 (two) times daily. 180 tablet 3  . cetirizine (ZYRTEC) 10 MG tablet Take 10 mg by mouth daily.    . clopidogrel (PLAVIX) 75 MG tablet Take 75 mg by mouth every evening.     . cyanocobalamin 2000 MCG tablet Take 2,000 mcg by mouth daily.    [name] ENTRESTO 49-51 MG TAKE 1 TABLET TWICE A DAY 99991111 tablet 3  . folic acid (FOLVITE) 1 MG tablet Take 1 tablet (1 mg total) by mouth daily.    . furosemide (LASIX) 20 MG tablet Take 20 mg by mouth daily.     [name] gabapentin (NEURONTIN) 300 MG capsule Take by mouth.    [name]  Iron-FA-B Cmp-C-Biot-Probiotic (FUSION PLUS) CAPS Take 1 capsule by mouth daily.     . Multiple Vitamins-Minerals (CENTRUM SILVER PO) Take 0.5 tablets by mouth 2 (two) times daily.     . naproxen (NAPROSYN) 500 MG tablet Take 1 tablet (500 mg total) by mouth 2 (two) times daily with a meal. (Patient taking differently: Take 500 mg by mouth as needed. ) 60 tablet 5  . pantoprazole (PROTONIX) 40 MG tablet TAKE 1 TABLET DAILY 90 tablet 3  . PARoxetine (PAXIL) 20 MG tablet Take 20 mg by mouth daily.    . potassium chloride SA (K-DUR,KLOR-CON) 20 MEQ tablet Take 20 mEq by mouth daily.    [name] pyridOXINE (VITAMIN B-6) 100 MG tablet Take 100 mg by mouth daily.    . vitamin C (ASCORBIC ACID) 500 MG tablet Take 500 mg by mouth daily.     No current facility-administered medications for this visit.     ROS: See HPI for pertinent positives and negatives.   Physical Examination  Vitals:   12/31/16 1015 12/31/16 1018  BP: (!) 150/69 (!) 149/84  Pulse: 67 67  Resp: 16   Temp: 97.1 F (36.2 C)   SpO2: 98%   Weight: 158 lb (71.7 kg)   Height: 5\' 3"  (1.6 m)    Body mass index is 27.99 kg/m.  General:A&O x 3, WDWN. Gait: normal Eyes:  PERRLA. Pulmonary: Respirations are non labored, CTAB, without wheezes, rales, or rhonchi. Cardiac: regular rythm, no detected murmur.Pacemaker/ICD palpated left upper chest.     Carotid Bruits Right Left   Negative Positive   Aorta is not palpable. Radial pulses: are 2+ palpable and =   VASCULAR EXAM: Extremities without ischemic changes without Gangrene; without open wounds.     LE Pulses Right Left   FEMORAL 2+ palpable 2+ palpable    POPLITEAL not palpable  not palpable   POSTERIOR TIBIAL not palpable  not palpable    DORSALIS PEDIS  ANTERIOR TIBIAL 2+ palpable  not palpable    Abdomen: soft, NT, no palpable masses. Skin: no rashes, no ulcers. Musculoskeletal: no muscle wasting or atrophy. Neurologic: A&O X 3; Appropriate Affect, MOTOR FUNCTION: moving all extremities equally, motor strength 5/5 throughout. Speech is fluent/normal. CN 2-12 intact.    ASSESSMENT: Roy [name] is a 71 y.o. male who is s/p bilateral external iliac artery stents placed 05/04/2014. He has bilateral calf claudication after walking about 75 feet, this has worsened from a year ago, relieved by rest. He has no signs of ischemia in his feet/legs.  He is walking less due to bilateral calves/thighs/buttocks pain. He also has known lumbar spine issues.  Unfortunately he continues to smoke but indicates he is receptive to quitting.  His atherosclerotic risk factors include current smoker x 57 years, CAD with hx of MI and CHF, COPD, and hx of ETOH abuse.   Left carotid bruit, see Plan. He has a hx of TIA in 2010 after stent placed in intracranial artery, no subsequent stroke or TIA, no carotid duplex result on file since 2015, requested by Dr. Estanislado Pandy:  <40% bilateral ICA stenoses.   DATA  ABI's today: Right: 0.76 (0.90 on 12-27-15 with triphasic waveforms), waveforms are monophasic; TBI: 0.66 (declined from 0.83). Left: 0.63 (declined slightly from 0.69 on 12-27-15 with biphasic PT, monophasic DP), waveforms are monophasic; TBI: 0.51 (declined slightly from 0.55).  12-27-15 bilateral iliac artery Duplex suggests mild disease in right CIA without significant stenosis.  Patent bilateral EIA stents with evidence of >  50% stenosis in the right stent exit/outflow (314 cm/s) (compared to 274 cm/s on 06/13/15 at distal stent)    PLAN:  The patient was counseled re smoking cessation and given several free resources re smoking cessation.  Graduated walking program discussed and how to achieve.    Based on the patient's vascular studies and examination, pt will return to clinic in 6 months with carotid duplex, ABI's, and bilateral iliac artery duplex.  I advised pt to notify us should he develop concerns re the circulation in his feet/legs.  I discussed in depth with the patient the nature of atherosclerosis, and emphasized the importance of maximal medical management including strict control of blood pressure, blood glucose, and lipid levels, obtaining regular exercise, and cessation of smoking.  The patient is aware that without maximal medical management the underlying atherosclerotic disease process will progress, limiting the benefit of any interventions.  The patient was given information about PAD including signs, symptoms, treatment, what symptoms should prompt the patient to seek immediate medical care, and risk reduction measures to take.  Clemon Chambers, RN, MSN, FNP-C [facility] Phone: [phone]  Clinic MD: [name]  12/31/16 10:39 AM

## 2016-12-31 NOTE — Progress Notes (Signed)
Vitals:   12/31/16 1015  BP: (!) 150/69  Pulse: 67  Resp: 16  Temp: 97.1 F (36.2 C)  SpO2: 98%  Weight: 158 lb (71.7 kg)  Height: 5\' 3"  (1.6 m)

## 2016-12-31 NOTE — Progress Notes (Signed)
Remote ICD transmission.   

## 2016-12-31 NOTE — Patient Instructions (Signed)
Peripheral Vascular Disease Peripheral vascular disease (PVD) is a disease of the blood vessels that are not part of your heart and brain. A simple term for PVD is poor circulation. In most cases, PVD narrows the blood vessels that carry blood from your heart to the rest of your body. This can result in a decreased supply of blood to your arms, legs, and internal organs, like your stomach or kidneys. However, it most often affects a person's lower legs and feet. There are two types of PVD.  Organic PVD. This is the more common type. It is caused by damage to the structure of blood vessels.  Functional PVD. This is caused by conditions that make blood vessels contract and tighten (spasm). Without treatment, PVD tends to get worse over time. PVD can also lead to acute ischemic limb. This is when an arm or limb suddenly has trouble getting enough blood. This is a medical emergency. Follow these instructions at home:  Take medicines only as told by your doctor.  Do not use any tobacco products, including cigarettes, chewing tobacco, or electronic cigarettes. If you need help quitting, ask your doctor.  Lose weight if you are overweight, and maintain a healthy weight as told by your doctor.  Eat a diet that is low in fat and cholesterol. If you need help, ask your doctor.  Exercise regularly. Ask your doctor for some good activities for you.  Take good care of your feet.  Wear comfortable shoes that fit well.  Check your feet often for any cuts or sores. Contact a doctor if:  You have cramps in your legs while walking.  You have leg pain when you are at rest.  You have coldness in a leg or foot.  Your skin changes.  You are unable to get or have an erection (erectile dysfunction).  You have cuts or sores on your feet that are not healing. Get help right away if:  Your arm or leg turns cold and blue.  Your arms or legs become red, warm, swollen, painful, or numb.  You have  chest pain or trouble breathing.  You suddenly have weakness in your face, arm, or leg.  You become very confused or you cannot speak.  You suddenly have a very bad headache.  You suddenly cannot see. This information is not intended to replace advice given to you by your health care provider. Make sure you discuss any questions you have with your health care provider. Document Released: 02/06/2010 Document Revised: 04/19/2016 Document Reviewed: 04/22/2014 Elsevier Interactive Patient Education  2017 Elsevier Inc.      Steps to Quit Smoking Smoking tobacco can be bad for your health. It can also affect almost every organ in your body. Smoking puts you and people around you at risk for many serious long-lasting (chronic) diseases. Quitting smoking is hard, but it is one of the best things that you can do for your health. It is never too late to quit. What are the benefits of quitting smoking? When you quit smoking, you lower your risk for getting serious diseases and conditions. They can include:  Lung cancer or lung disease.  Heart disease.  Stroke.  Heart attack.  Not being able to have children (infertility).  Weak bones (osteoporosis) and broken bones (fractures). If you have coughing, wheezing, and shortness of breath, those symptoms may get better when you quit. You may also get sick less often. If you are pregnant, quitting smoking can help to lower your chances   of having a baby of low birth weight. What can I do to help me quit smoking? Talk with your doctor about what can help you quit smoking. Some things you can do (strategies) include:  Quitting smoking totally, instead of slowly cutting back how much you smoke over a period of time.  Going to in-person counseling. You are more likely to quit if you go to many counseling sessions.  Using resources and support systems, such as:  Online chats with a counselor.  Phone quitlines.  Printed self-help  materials.  Support groups or group counseling.  Text messaging programs.  Mobile phone apps or applications.  Taking medicines. Some of these medicines may have nicotine in them. If you are pregnant or breastfeeding, do not take any medicines to quit smoking unless your doctor says it is okay. Talk with your doctor about counseling or other things that can help you. Talk with your doctor about using more than one strategy at the same time, such as taking medicines while you are also going to in-person counseling. This can help make quitting easier. What things can I do to make it easier to quit? Quitting smoking might feel very hard at first, but there is a lot that you can do to make it easier. Take these steps:  Talk to your family and friends. Ask them to support and encourage you.  Call phone quitlines, reach out to support groups, or work with a counselor.  Ask people who smoke to not smoke around you.  Avoid places that make you want (trigger) to smoke, such as:  Bars.  Parties.  Smoke-break areas at work.  Spend time with people who do not smoke.  Lower the stress in your life. Stress can make you want to smoke. Try these things to help your stress:  Getting regular exercise.  Deep-breathing exercises.  Yoga.  Meditating.  Doing a body scan. To do this, close your eyes, focus on one area of your body at a time from head to toe, and notice which parts of your body are tense. Try to relax the muscles in those areas.  Download or buy apps on your mobile phone or tablet that can help you stick to your quit plan. There are many free apps, such as QuitGuide from the CDC (Centers for Disease Control and Prevention). You can find more support from smokefree.gov and other websites. This information is not intended to replace advice given to you by your health care provider. Make sure you discuss any questions you have with your health care provider. Document Released:  09/08/2009 Document Revised: 07/10/2016 Document Reviewed: 03/29/2015 Elsevier Interactive Patient Education  2017 Elsevier Inc.  

## 2017-01-01 LAB — CUP PACEART REMOTE DEVICE CHECK
Battery Remaining Longevity: 65 mo
Battery Remaining Percentage: 80 %
Battery Voltage: 2.99 V
Brady Statistic AP VP Percent: 27 %
Brady Statistic AS VP Percent: 72 %
Brady Statistic RA Percent Paced: 27 %
Date Time Interrogation Session: 20180205184209
HIGH POWER IMPEDANCE MEASURED VALUE: 82 Ohm
HighPow Impedance: 82 Ohm
Implantable Lead Implant Date: 20160923
Lead Channel Impedance Value: 700 Ohm
Lead Channel Pacing Threshold Amplitude: 0.875 V
Lead Channel Pacing Threshold Amplitude: 1 V
Lead Channel Pacing Threshold Pulse Width: 0.5 ms
Lead Channel Pacing Threshold Pulse Width: 0.8 ms
Lead Channel Sensing Intrinsic Amplitude: 12 mV
Lead Channel Sensing Intrinsic Amplitude: 4.8 mV
Lead Channel Setting Pacing Amplitude: 2 V
Lead Channel Setting Pacing Amplitude: 2 V
Lead Channel Setting Sensing Sensitivity: 0.5 mV
MDC IDC LEAD IMPLANT DT: 20160923
MDC IDC LEAD IMPLANT DT: 20160923
MDC IDC LEAD LOCATION: 753858
MDC IDC LEAD LOCATION: 753859
MDC IDC LEAD LOCATION: 753860
MDC IDC MSMT LEADCHNL LV IMPEDANCE VALUE: 410 Ohm
MDC IDC MSMT LEADCHNL RA IMPEDANCE VALUE: 490 Ohm
MDC IDC MSMT LEADCHNL RA PACING THRESHOLD AMPLITUDE: 0.75 V
MDC IDC MSMT LEADCHNL RA PACING THRESHOLD PULSEWIDTH: 0.5 ms
MDC IDC PG IMPLANT DT: 20160923
MDC IDC PG SERIAL: 7294918
MDC IDC SET LEADCHNL LV PACING AMPLITUDE: 2.25 V
MDC IDC SET LEADCHNL LV PACING PULSEWIDTH: 0.8 ms
MDC IDC SET LEADCHNL RV PACING PULSEWIDTH: 0.5 ms
MDC IDC STAT BRADY AP VS PERCENT: 1 %
MDC IDC STAT BRADY AS VS PERCENT: 1 %

## 2017-01-02 ENCOUNTER — Encounter: Payer: Self-pay | Admitting: Cardiology

## 2017-01-02 NOTE — Addendum Note (Signed)
Addended by: Lianne Cure A on: 01/02/2017 11:53 AM   Modules accepted: Orders

## 2017-01-10 ENCOUNTER — Telehealth (HOSPITAL_COMMUNITY): Payer: Self-pay

## 2017-01-10 NOTE — Telephone Encounter (Signed)
Called to schedule, left vm. AW 

## 2017-01-17 ENCOUNTER — Telehealth (HOSPITAL_COMMUNITY): Payer: Self-pay

## 2017-01-17 NOTE — Telephone Encounter (Signed)
Called to schedule consult, left vm. AW

## 2017-01-21 ENCOUNTER — Ambulatory Visit (INDEPENDENT_AMBULATORY_CARE_PROVIDER_SITE_OTHER): Payer: Medicare Other | Admitting: Cardiovascular Disease

## 2017-01-21 ENCOUNTER — Encounter: Payer: Self-pay | Admitting: Cardiovascular Disease

## 2017-01-21 VITALS — BP 130/70 | HR 76 | Ht 63.0 in | Wt 160.0 lb

## 2017-01-21 DIAGNOSIS — I5022 Chronic systolic (congestive) heart failure: Secondary | ICD-10-CM | POA: Diagnosis not present

## 2017-01-21 DIAGNOSIS — I10 Essential (primary) hypertension: Secondary | ICD-10-CM | POA: Diagnosis not present

## 2017-01-21 DIAGNOSIS — Z720 Tobacco use: Secondary | ICD-10-CM | POA: Diagnosis not present

## 2017-01-21 DIAGNOSIS — Z950 Presence of cardiac pacemaker: Secondary | ICD-10-CM

## 2017-01-21 DIAGNOSIS — I25118 Atherosclerotic heart disease of native coronary artery with other forms of angina pectoris: Secondary | ICD-10-CM

## 2017-01-21 DIAGNOSIS — I255 Ischemic cardiomyopathy: Secondary | ICD-10-CM

## 2017-01-21 DIAGNOSIS — I519 Heart disease, unspecified: Secondary | ICD-10-CM

## 2017-01-21 DIAGNOSIS — I739 Peripheral vascular disease, unspecified: Secondary | ICD-10-CM | POA: Diagnosis not present

## 2017-01-21 DIAGNOSIS — E7800 Pure hypercholesterolemia, unspecified: Secondary | ICD-10-CM | POA: Diagnosis not present

## 2017-01-21 DIAGNOSIS — I252 Old myocardial infarction: Secondary | ICD-10-CM | POA: Diagnosis not present

## 2017-01-21 NOTE — Patient Instructions (Signed)
Your physician wants you to follow-up in:  1 year with Dr Koneswaran You will receive a reminder letter in the mail two months in advance. If you don't receive a letter, please call our office to schedule the follow-up appointment.    Your physician recommends that you continue on your current medications as directed. Please refer to the Current Medication list given to you today.    If you need a refill on your cardiac medications before your next appointment, please call your pharmacy.     Thank you for choosing  [facility] !        

## 2017-01-21 NOTE — Progress Notes (Signed)
SUBJECTIVE: The patient presents for routine follow up. He was hospitalized in 2015 for pneumonia with hypoxemic respiratory failure and found to have a non-STEMI. He has a history of tobacco and alcohol abuse, COPD, cirrhosis, cerebrovascular disease with intracerebral stenting, chronic systolic heart failure with an ejection fraction of 35% (07/14/2015 echo) s/p BiV ICD (followed by Dr. Lovena Le), chronic kidney disease and peripheral arterial disease. His NSTEMI was medically managed. Troponins peaked at 4.84.   He has had peripheral vascular stenting (bilateral iliac stenting in 04/2014) by Dr. [name].  He denies chest pain, shortness of breath, syncope, and leg swelling.   Nuclear stress testing on 08/30/2014 demonstrated a large area of inferior myocardial scar with no evidence of ischemia, and inferior akinesis.  He continues to smoke 1 ppd.  He stays active doing woodworking in his shop.  ECG performed in the office today shows a paced ventricular rhythm.   Review of Systems: As per "subjective", otherwise negative.  No Known Allergies  Current Outpatient Prescriptions  Medication Sig Dispense Refill  . aspirin EC 81 MG tablet Take 81 mg by mouth every evening.     [name] atorvastatin (LIPITOR) 20 MG tablet Take 20 mg by mouth daily.     . carvedilol (COREG) 3.125 MG tablet Take 1 tablet (3.125 mg total) by mouth 2 (two) times daily. 180 tablet 3  . cetirizine (ZYRTEC) 10 MG tablet Take 10 mg by mouth daily.    . clopidogrel (PLAVIX) 75 MG tablet Take 75 mg by mouth every evening.     . cyanocobalamin 2000 MCG tablet Take 2,000 mcg by mouth daily.    [name] ENTRESTO 49-51 MG TAKE 1 TABLET TWICE A DAY 99991111 tablet 3  . folic acid (FOLVITE) 1 MG tablet Take 1 tablet (1 mg total) by mouth daily.    . furosemide (LASIX) 20 MG tablet Take 20 mg by mouth daily.     [name] gabapentin (NEURONTIN) 300 MG capsule Take by mouth.    . Iron-FA-B Cmp-C-Biot-Probiotic (FUSION PLUS) CAPS Take 1  capsule by mouth daily.     . Multiple Vitamins-Minerals (CENTRUM SILVER PO) Take 0.5 tablets by mouth 2 (two) times daily.     . naproxen (NAPROSYN) 500 MG tablet Take 1 tablet (500 mg total) by mouth 2 (two) times daily with a meal. (Patient taking differently: Take 500 mg by mouth as needed. ) 60 tablet 5  . pantoprazole (PROTONIX) 40 MG tablet TAKE 1 TABLET DAILY 90 tablet 3  . PARoxetine (PAXIL) 20 MG tablet Take 20 mg by mouth daily.    . potassium chloride SA (K-DUR,KLOR-CON) 20 MEQ tablet Take 20 mEq by mouth daily.    [name] pyridOXINE (VITAMIN B-6) 100 MG tablet Take 100 mg by mouth daily.    . vitamin C (ASCORBIC ACID) 500 MG tablet Take 500 mg by mouth daily.     No current facility-administered medications for this visit.     Past Medical History:  Diagnosis Date  . AICD (automatic cardioverter/defibrillator) present 08/19/2015   St Jude BiV ICD for primary prevention by Dr. Lovena Le  . Alcohol abuse    6 beers per day; hospital admission in 2009 for withdrawal symptoms  . Alcoholic cirrhosis ([place])   . Anxiety and depression   . Cerebrovascular disease 2009   TIA; 2009- right ICA stent; re-intervention for restenosis complicated by [facility] w/o sx  . CHF (congestive heart failure) ([place]) 06-02-14  . Chronic obstructive pulmonary disease ([place])   .  Degenerative joint disease    Total shoulder arthroplasty-right  . Hyperlipidemia   . Hypertension   . LBBB (left bundle branch block)    Normal echo-2011; stress nuclear in 09/2010--septal hypoperfusion representing nontransmural infarction or the effect of left bundle branch block, no ischemia  . Myocardial infarction June 02, 2014   Massive Heart Attack  . Peripheral vascular disease ([place])   . 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  . Thrombocytopenia ([place])   . Tobacco abuse    -100 pack years; 1.5 packs per day  . Tubular adenoma of colon     Past Surgical History:  Procedure Laterality Date  . ABDOMINAL AORTAGRAM [name]  05/04/2014   Procedure: ABDOMINAL Maxcine Ham;  Surgeon: [name], MD;  Location: [facility] CATH LAB;  Service: Cardiovascular;  Laterality: [name];  . AGILE CAPSULE [name] 03/18/2014   Procedure: AGILE CAPSULE;  Surgeon: Danie Binder, MD;  [facility] ENDO [facility];  Service: Endoscopy;  Laterality: [name];  7:30  . BACK SURGERY    . BACTERIAL OVERGROWTH TEST [name] 05/24/2015   Procedure: BACTERIAL OVERGROWTH TEST;  Surgeon: Danie Binder, MD;  [facility]: [facility];  Service: Endoscopy;  Laterality: [name];  0700  . BI-VENTRICULAR IMPLANTABLE CARDIOVERTER DEFIBRILLATOR  (CRT-D)  08/19/2015  . CARPAL TUNNEL RELEASE Left 02/02/2016   Procedure: LEFT CARPAL TUNNEL RELEASE;  Surgeon: Daryll Brod, MD;  Location: [place];  Service: Orthopedics;  Laterality: Left;  ANESTHESIA: IV REGIONAL UPPER ARM  . CATARACT EXTRACTION W/PHACO Right 03/08/2015   Procedure: CATARACT EXTRACTION PHACO AND INTRAOCULAR LENS PLACEMENT (IOC);  Surgeon: Rutherford Guys, MD;  Location: [facility] ORS;  Service: Ophthalmology;  Laterality: Right;  CDE:9.46  . CATARACT EXTRACTION W/PHACO Left 03/22/2015   Procedure: CATARACT EXTRACTION PHACO AND INTRAOCULAR LENS PLACEMENT (IOC);  Surgeon: Rutherford Guys, MD;  Location: [facility] OR[facility];  Service: Ophthalmology;  Laterality: Left;  CDE:5.80  . COLONOSCOPY  08/22/09   Fields-(Tubular Adenoma)3-mm transverse polyp/4-mm polyp otherwise noraml/small internal hemorrhoids  . COLONOSCOPY [name] 04/14/2014   [id number] internal hemorrhids/normal mocsa in the terminal iluem/left colonis redundant  . COLONOSCOPY W/ POLYPECTOMY  2011  . EP IMPLANTABLE DEVICE [name] 08/19/2015   Procedure: BiV ICD Insertion CRT-D;  Surgeon: Evans Lance, MD;  Location: [place] CV LAB;  Service: Cardiovascular;  Laterality: [name];  . ESOPHAGOGASTRODUODENOSCOPY [name] 03/05/2014   SLF: 1. Stricture at the gastroesophagael junction 2. Small hiatal hernia 3. Moderate non-erosive gastritis and duodentitis. 4. No surce for Melena identified.   [name]  GIVENS CAPSULE STUDY [name] 03/30/2014   Procedure: GIVENS CAPSULE STUDY;  Surgeon: Danie Binder, MD;  [facility];  Service: Endoscopy;  Laterality: [name];  7:30  . JOINT REPLACEMENT Right   . LUMBAR FUSION  2010  . TOTAL SHOULDER ARTHROPLASTY Right 2011   Dr. Tamera Punt  . ULNAR NERVE TRANSPOSITION Left 02/02/2016   Procedure: LEFT IN-SITU DECOMPRESSION ULNAR NERVE ;  Surgeon: Daryll Brod, MD;  Location: [facility];  Service: Orthopedics;  Laterality: Left;  . ULNAR TUNNEL RELEASE Left 02/02/2016   Procedure: LEFT CUBITAL TUNNEL RELEASE;  Surgeon: Daryll Brod, MD;  Location: [place];  Service: Orthopedics;  Laterality: Left;  [name] VASECTOMY  1971    Social History   Social History  . Marital status: Married    Spouse name: [name]  . Number of children: 2  . Years of education: [name]   Occupational History  . 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  Retired   Social History Main Topics  . Smoking status: Current Every Day Smoker    Packs/day: 1.00    Years: 60.00    Types: E-cigarettes, Cigarettes    Start date: 08/20/1957  . Smokeless tobacco: Never Used  . Alcohol use No     Comment: quit in July 2015; No ETOH to date 02/14/15  . Drug use: No  . Sexual activity: No   Other Topics Concern  . Not on file   Social History Narrative   Accompanied by daughter [name]   Lives w/ wife, daughter     Vitals:   01/21/17 0852  BP: 130/70  Pulse: 76  SpO2: 95%  Weight: 160 lb (72.6 kg)  Height: 5\' 3"  (1.6 m)    PHYSICAL EXAM General: NAD HEENT: Normal. Neck: No JVD, no thyromegaly. Lungs: Clear to auscultation bilaterally with normal respiratory effort. CV: Nondisplaced PMI.  Regular rate and rhythm, normal S1/S2, no S3/S4, no murmur. No pretibial or periankle edema.  No carotid bruit.   Abdomen: Soft, nontender, no distention.  Neurologic: Alert and oriented.  Psych: Normal affect. Skin: Normal. Musculoskeletal: No gross  deformities.    ECG: Most recent ECG reviewed.      ASSESSMENT AND PLAN: 1. CAD: Stable ischemic heart disease. Nuclear MPI study results noted above. Continue ASA, Plavix, Coreg, and Lipitor.   2. Chronic systolic heart failure s/p BivICD: Euvolemic and stable on Lasix 20 mg. Continue carvedilol and Entresto.   3. Tobacco abuse: Continues to smoke 1 ppd. He is unwilling to quit.  4. PVD: Followed by vascular surgery. Continue ASA and statin.   5. Hyperlipidemia: Continue Lipitor 20 mg.    6. Essential HTN: Well controlled on current therapy. No changes.  Dispo: fu 1 year   Kate Sable, M.D., F.A.C.C.

## 2017-02-11 ENCOUNTER — Telehealth (HOSPITAL_COMMUNITY): Payer: Self-pay

## 2017-02-11 NOTE — Telephone Encounter (Signed)
Called to schedule consult, no answer. AW  

## 2017-02-13 ENCOUNTER — Other Ambulatory Visit: Payer: Self-pay

## 2017-02-13 ENCOUNTER — Encounter: Payer: Self-pay | Admitting: Nurse Practitioner

## 2017-02-13 ENCOUNTER — Ambulatory Visit (INDEPENDENT_AMBULATORY_CARE_PROVIDER_SITE_OTHER): Payer: Medicare Other | Admitting: Nurse Practitioner

## 2017-02-13 VITALS — BP 150/80 | HR 67 | Temp 97.8°F | Ht 63.0 in | Wt 166.2 lb

## 2017-02-13 DIAGNOSIS — K7030 Alcoholic cirrhosis of liver without ascites: Secondary | ICD-10-CM | POA: Diagnosis not present

## 2017-02-13 DIAGNOSIS — I255 Ischemic cardiomyopathy: Secondary | ICD-10-CM

## 2017-02-13 DIAGNOSIS — K7460 Unspecified cirrhosis of liver: Secondary | ICD-10-CM

## 2017-02-13 LAB — COMPREHENSIVE METABOLIC PANEL
ALK PHOS: 75 U/L (ref 40–115)
ALT: 13 U/L (ref 9–46)
AST: 19 U/L (ref 10–35)
Albumin: 4.4 g/dL (ref 3.6–5.1)
BILIRUBIN TOTAL: 0.6 mg/dL (ref 0.2–1.2)
BUN: 12 mg/dL (ref 7–25)
CO2: 29 mmol/L (ref 20–31)
Calcium: 9.3 mg/dL (ref 8.6–10.3)
Chloride: 99 mmol/L (ref 98–110)
Creat: 0.65 mg/dL — ABNORMAL LOW (ref 0.70–1.18)
GLUCOSE: 90 mg/dL (ref 65–99)
Potassium: 5.1 mmol/L (ref 3.5–5.3)
Sodium: 137 mmol/L (ref 135–146)
TOTAL PROTEIN: 6.6 g/dL (ref 6.1–8.1)

## 2017-02-13 LAB — CBC WITH DIFFERENTIAL/PLATELET
BASOS PCT: 0 %
Basophils Absolute: 0 cells/uL (ref 0–200)
EOS ABS: 510 {cells}/uL — AB (ref 15–500)
Eosinophils Relative: 6 %
HCT: 42.7 % (ref 38.5–50.0)
Hemoglobin: 14.4 g/dL (ref 13.2–17.1)
LYMPHS PCT: 18 %
Lymphs Abs: 1530 cells/uL (ref 850–3900)
MCH: 31.4 pg (ref 27.0–33.0)
MCHC: 33.7 g/dL (ref 32.0–36.0)
MCV: 93 fL (ref 80.0–100.0)
MPV: 11.3 fL (ref 7.5–12.5)
Monocytes Absolute: 935 cells/uL (ref 200–950)
Monocytes Relative: 11 %
NEUTROS ABS: 5525 {cells}/uL (ref 1500–7800)
Neutrophils Relative %: 65 %
PLATELETS: 259 10*3/uL (ref 140–400)
RBC: 4.59 MIL/uL (ref 4.20–5.80)
RDW: 13.4 % (ref 11.0–15.0)
WBC: 8.5 10*3/uL (ref 3.8–10.8)

## 2017-02-13 LAB — PROTIME-INR
INR: 1.1
PROTHROMBIN TIME: 11.2 s (ref 9.0–11.5)

## 2017-02-13 NOTE — Progress Notes (Signed)
CC'ED TO PCP 

## 2017-02-13 NOTE — Assessment & Plan Note (Signed)
History of alcoholic cirrhosis. Denies any ongoing alcohol use (since his heart attack). He has not obtained an ultrasound for the past 2 visits. He states he did not get last time because it was never scheduled for him. I will attempt to address this. At this point we'll check routine labs including CBC, CMP, PTT/INR, AFP as well as right upper quadrant ultrasound for [place] screening. Return for follow-up in 6 months or sooner if any symptoms develop.

## 2017-02-13 NOTE — Patient Instructions (Signed)
1. Have your labs drawn when you can. 2. We will help schedule your right upper quadrant ultrasound for you. 3. Return for follow-up in 6 months. If he develops any symptoms she can call us before then.

## 2017-02-13 NOTE — Progress Notes (Signed)
Referring Provider: Celene Squibb, MD Primary Care Physician:  Wende Neighbors, MD Primary GI:  Dr. Oneida Alar  Chief Complaint  Patient presents with  . Cirrhosis    HPI:   Roy [name] is a 71 y.o. male who presents for follow-up on nonalcoholic cirrhosis. The patient was last seen in our office 08/16/2016 for the same. At that time he was doing well. Generally asymptomatic from a GI standpoint. Routine labs and imaging were ordered at that time. Previous ultrasound did not appear to been completed. Lab showed a meld score of 6, Child Pugh class A. Abdominal ultrasound, again, has not been completed.  Today he states he's doing ok. Denies abdominal pain, N/V, yellowing of skin/eyes, darkened urine, acute episodic confusion. Did not get abdominal ultrasound because he says nobody ever call to schedule it or tell him when it was scheduled. Denies chest pain, dyspnea, dizziness, lightheadedness, syncope, near syncope. Denies any other upper or lower GI symptoms.  Deneis any ongoing ETOH use.  Past Medical History:  Diagnosis Date  . AICD (automatic cardioverter/defibrillator) present 08/19/2015   St Jude BiV ICD for primary prevention by Dr. Lovena Le  . Alcohol abuse    6 beers per day; hospital admission in 2009 for withdrawal symptoms  . Alcoholic cirrhosis ([place])   . Anxiety and depression   . Cerebrovascular disease 2009   TIA; 2009- right ICA stent; re-intervention for restenosis complicated by [facility] w/o sx  . CHF (congestive heart failure) ([place]) 06-02-14  . Chronic obstructive pulmonary disease ([place])   . Degenerative joint disease    Total shoulder arthroplasty-right  . Hyperlipidemia   . Hypertension   . LBBB (left bundle branch block)    Normal echo-2011; stress nuclear in 09/2010--septal hypoperfusion representing nontransmural infarction or the effect of left bundle branch block, no ischemia  . Myocardial infarction June 02, 2014   Massive Heart Attack  . Peripheral vascular disease  ([place])   . 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  . Thrombocytopenia ([place])   . Tobacco abuse    -100 pack years; 1.5 packs per day  . Tubular adenoma of colon     Past Surgical History:  Procedure Laterality Date  . ABDOMINAL AORTAGRAM [name] 05/04/2014   Procedure: ABDOMINAL Maxcine Ham;  Surgeon: [name], MD;  Location: [facility] CATH LAB;  Service: Cardiovascular;  Laterality: [name];  . AGILE CAPSULE [name] 03/18/2014   Procedure: AGILE CAPSULE;  Surgeon: Danie Binder, MD;  Location: [facility] ENDO SUITE;  Service: Endoscopy;  Laterality: [name];  7:30  . BACK SURGERY    . BACTERIAL OVERGROWTH TEST [name] 05/24/2015   Procedure: BACTERIAL OVERGROWTH TEST;  Surgeon: Danie Binder, MD;  Location: [facility] ENDO SUITE;  Service: Endoscopy;  Laterality: [name];  0700  . BI-VENTRICULAR IMPLANTABLE CARDIOVERTER DEFIBRILLATOR  (CRT-D)  08/19/2015  . CARPAL TUNNEL RELEASE Left 02/02/2016   Procedure: LEFT CARPAL TUNNEL RELEASE;  Surgeon: Daryll Brod, MD;  Location: [place];  Service: Orthopedics;  Laterality: Left;  ANESTHESIA: IV REGIONAL UPPER ARM  . CATARACT EXTRACTION W/PHACO Right 03/08/2015   Procedure: CATARACT EXTRACTION PHACO AND INTRAOCULAR LENS PLACEMENT (IOC);  Surgeon: Rutherford Guys, MD;  Location: [facility] ORS;  Service: Ophthalmology;  Laterality: Right;  CDE:9.46  . CATARACT EXTRACTION W/PHACO Left 03/22/2015   Procedure: CATARACT EXTRACTION PHACO AND INTRAOCULAR LENS PLACEMENT (IOC);  Surgeon: Rutherford Guys, MD;  Location: [facility] ORS;  Service: Ophthalmology;  Laterality: Left;  CDE:5.80  . COLONOSCOPY  08/22/09   Fields-(Tubular  Adenoma)3-mm transverse polyp/4-mm polyp otherwise noraml/small internal hemorrhoids  . COLONOSCOPY [name] 04/14/2014   OAC:ZYSAY internal hemorrhids/normal mocsa in the terminal iluem/left colonis redundant  . COLONOSCOPY W/ POLYPECTOMY  2011  . EP IMPLANTABLE DEVICE [name] 08/19/2015   Procedure: BiV ICD Insertion CRT-D;  Surgeon: Evans Lance, MD;  Location: [place] CV  LAB;  Service: Cardiovascular;  Laterality: [name];  . ESOPHAGOGASTRODUODENOSCOPY [name] 03/05/2014   SLF: 1. Stricture at the gastroesophagael junction 2. Small hiatal hernia 3. Moderate non-erosive gastritis and duodentitis. 4. No surce for Melena identified.   [name] GIVENS CAPSULE STUDY [name] 03/30/2014   Procedure: GIVENS CAPSULE STUDY;  Surgeon: Danie Binder, MD;  [facility];  Service: Endoscopy;  Laterality: [name];  7:30  . JOINT REPLACEMENT Right   . LUMBAR FUSION  2010  . TOTAL SHOULDER ARTHROPLASTY Right 2011   Dr. Tamera Punt  . ULNAR NERVE TRANSPOSITION Left 02/02/2016   Procedure: LEFT IN-SITU DECOMPRESSION ULNAR NERVE ;  Surgeon: Daryll Brod, MD;  Location: [place];  Service: Orthopedics;  Laterality: Left;  . ULNAR TUNNEL RELEASE Left 02/02/2016   Procedure: LEFT CUBITAL TUNNEL RELEASE;  Surgeon: Daryll Brod, MD;  Location: [place];  Service: Orthopedics;  Laterality: Left;  [name] VASECTOMY  1971    Current Outpatient Prescriptions  Medication Sig Dispense Refill  . aspirin EC 81 MG tablet Take 81 mg by mouth every evening.     [name] atorvastatin (LIPITOR) 20 MG tablet Take 20 mg by mouth daily.     . carvedilol (COREG) 3.125 MG tablet Take 1 tablet (3.125 mg total) by mouth 2 (two) times daily. 180 tablet 3  . cetirizine (ZYRTEC) 10 MG tablet Take 10 mg by mouth daily.    . clopidogrel (PLAVIX) 75 MG tablet Take 75 mg by mouth every evening.     . cyanocobalamin 2000 MCG tablet Take 2,000 mcg by mouth daily.    [name] ENTRESTO 49-51 MG TAKE 1 TABLET TWICE A DAY 301 tablet 3  . folic acid (FOLVITE) 1 MG tablet Take 1 tablet (1 mg total) by mouth daily.    . furosemide (LASIX) 20 MG tablet Take 20 mg by mouth daily.     [name] gabapentin (NEURONTIN) 300 MG capsule Take by mouth.    . Iron-FA-B Cmp-C-Biot-Probiotic (FUSION PLUS) CAPS Take 1 capsule by mouth daily.     . Multiple Vitamins-Minerals (CENTRUM SILVER PO) Take 0.5 tablets by mouth 2 (two) times daily.     .  naproxen (NAPROSYN) 500 MG tablet Take 1 tablet (500 mg total) by mouth 2 (two) times daily with a meal. (Patient taking differently: Take 500 mg by mouth as needed. ) 60 tablet 5  . pantoprazole (PROTONIX) 40 MG tablet TAKE 1 TABLET DAILY 90 tablet 3  . PARoxetine (PAXIL) 20 MG tablet Take 20 mg by mouth daily.    . potassium chloride SA (K-DUR,KLOR-CON) 20 MEQ tablet Take 20 mEq by mouth daily.    [name] pyridOXINE (VITAMIN B-6) 100 MG tablet Take 100 mg by mouth daily.    . vitamin C (ASCORBIC ACID) 500 MG tablet Take 500 mg by mouth daily.     No current facility-administered medications for this visit.     Allergies as of 02/13/2017  . (No Known Allergies)    Family History  Problem Relation Age of Onset  . Coronary artery disease Mother   . Diabetes Mother   . Heart disease Mother     Before age  60 - 5 Bypasses  . Hypertension Mother   . Heart attack Mother     3-4 Heart attacks  . Alzheimer's disease Father   . Diabetes Father   . Heart attack Sister   . Cancer Brother     Prostate  . Hyperlipidemia Son   . Prostate cancer Brother   . Alzheimer's disease Sister   . Colon cancer Neg Hx     Social History   Social History  . Marital status: Married    Spouse name: [name]  . Number of children: 2  . Years of education: [name]   Occupational History  . 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  Retired   Social History Main Topics  . Smoking status: Current Every Day Smoker    Packs/day: 1.00    Years: 60.00    Types: E-cigarettes, Cigarettes    Start date: 08/20/1957  . Smokeless tobacco: Never Used  . Alcohol use No     Comment: quit in July 2015; No ETOH to date 02/14/15  . Drug use: No  . Sexual activity: No   Other Topics Concern  . None   Social History Narrative   Accompanied by daughter [name]   Lives w/ wife, daughter    Review of Systems: Complete ROS negative except as per HPI.   Physical Exam: BP (!) 150/80   Pulse 67   Temp 97.8 F  (36.6 C) (Oral)   Ht 5\' 3"  (1.6 m)   Wt 166 lb 3.2 oz (75.4 kg)   BMI 29.44 kg/m  General:   Alert and oriented. Pleasant and cooperative. Well-nourished and well-developed.  Head:  Normocephalic and atraumatic. Eyes:  Without icterus, sclera clear and conjunctiva pink.  Ears:  Normal auditory acuity. Cardiovascular:  S1, S2 present without murmurs appreciated. Extremities without clubbing or edema. Respiratory:  Clear to auscultation bilaterally. No wheezes, rales, or rhonchi. No distress.  Gastrointestinal:  +BS, rounded but soft, non-tender and non-distended. No HSM noted. No guarding or rebound. No masses appreciated.  Rectal:  Deferred  Musculoskalatal:  Symmetrical without gross deformities. Neurologic:  Alert and oriented x4;  grossly normal neurologically. Psych:  Alert and cooperative. Normal mood and affect. Heme/Lymph/Immune: No excessive bruising noted.    02/13/2017 8:24 AM   Disclaimer: This note was dictated with voice recognition software. Similar sounding words can inadvertently be transcribed and may not be corrected upon review.

## 2017-02-14 LAB — AFP TUMOR MARKER: AFP-Tumor Marker: 2 ng/mL (ref <6.1)

## 2017-02-15 ENCOUNTER — Ambulatory Visit (HOSPITAL_COMMUNITY)
Admission: RE | Admit: 2017-02-15 | Discharge: 2017-02-15 | Disposition: A | Discharge status: Home / Self Care | Payer: Medicare Other | Source: Ambulatory Visit | Attending: Nurse Practitioner | Admitting: Nurse Practitioner

## 2017-02-15 DIAGNOSIS — K7460 Unspecified cirrhosis of liver: Secondary | ICD-10-CM | POA: Diagnosis present

## 2017-02-18 NOTE — Progress Notes (Signed)
PT is aware.

## 2017-02-18 NOTE — Progress Notes (Signed)
PT is aware. Michela Pitcher he is using MY CHART and had just saw the note from [place]. He loves it.

## 2017-03-12 ENCOUNTER — Encounter (HOSPITAL_COMMUNITY): Payer: Self-pay | Admitting: Radiology

## 2017-03-12 NOTE — Progress Notes (Signed)
REVIEWED-NO ADDITIONAL RECOMMENDATIONS. 

## 2017-03-14 ENCOUNTER — Other Ambulatory Visit (HOSPITAL_COMMUNITY): Payer: Self-pay | Admitting: Interventional Radiology

## 2017-03-14 DIAGNOSIS — I771 Stricture of artery: Secondary | ICD-10-CM

## 2017-04-01 ENCOUNTER — Ambulatory Visit (INDEPENDENT_AMBULATORY_CARE_PROVIDER_SITE_OTHER): Payer: Medicare Other | Admitting: *Deleted

## 2017-04-01 ENCOUNTER — Ambulatory Visit (HOSPITAL_COMMUNITY)
Admission: RE | Admit: 2017-04-01 | Discharge: 2017-04-01 | Disposition: A | Discharge status: Home / Self Care | Payer: Medicare Other | Source: Ambulatory Visit | Attending: Interventional Radiology | Admitting: Interventional Radiology

## 2017-04-01 DIAGNOSIS — I255 Ischemic cardiomyopathy: Secondary | ICD-10-CM

## 2017-04-01 DIAGNOSIS — I669 Occlusion and stenosis of unspecified cerebral artery: Secondary | ICD-10-CM | POA: Diagnosis not present

## 2017-04-01 DIAGNOSIS — I771 Stricture of artery: Secondary | ICD-10-CM

## 2017-04-01 HISTORY — PX: IR RADIOLOGIST EVAL & MGMT: IMG5224

## 2017-04-01 NOTE — Progress Notes (Signed)
Remote ICD transmission.   

## 2017-04-02 ENCOUNTER — Encounter (HOSPITAL_COMMUNITY): Payer: Self-pay | Admitting: Interventional Radiology

## 2017-04-02 LAB — CUP PACEART REMOTE DEVICE CHECK
Battery Remaining Percentage: 78 %
Battery Voltage: 2.98 V
Brady Statistic AP VP Percent: 25 %
Brady Statistic AS VP Percent: 74 %
Brady Statistic RA Percent Paced: 25 %
HIGH POWER IMPEDANCE MEASURED VALUE: 83 Ohm
HighPow Impedance: 83 Ohm
Implantable Lead Implant Date: 20160923
Implantable Lead Location: 753859
Implantable Lead Location: 753860
Lead Channel Impedance Value: 440 Ohm
Lead Channel Impedance Value: 750 Ohm
Lead Channel Pacing Threshold Amplitude: 0.75 V
Lead Channel Pacing Threshold Amplitude: 0.875 V
Lead Channel Pacing Threshold Pulse Width: 0.5 ms
Lead Channel Pacing Threshold Pulse Width: 0.8 ms
Lead Channel Sensing Intrinsic Amplitude: 12 mV
Lead Channel Sensing Intrinsic Amplitude: 5 mV
Lead Channel Setting Pacing Amplitude: 2 V
Lead Channel Setting Pacing Amplitude: 2 V
MDC IDC LEAD IMPLANT DT: 20160923
MDC IDC LEAD IMPLANT DT: 20160923
MDC IDC LEAD LOCATION: 753858
MDC IDC MSMT BATTERY REMAINING LONGEVITY: 64 mo
MDC IDC MSMT LEADCHNL LV PACING THRESHOLD AMPLITUDE: 1 V
MDC IDC MSMT LEADCHNL RA IMPEDANCE VALUE: 510 Ohm
MDC IDC MSMT LEADCHNL RA PACING THRESHOLD PULSEWIDTH: 0.5 ms
MDC IDC PG IMPLANT DT: 20160923
MDC IDC PG SERIAL: 7294918
MDC IDC SESS DTM: 20180507060020
MDC IDC SET LEADCHNL LV PACING AMPLITUDE: 2.25 V
MDC IDC SET LEADCHNL LV PACING PULSEWIDTH: 0.8 ms
MDC IDC SET LEADCHNL RV PACING PULSEWIDTH: 0.5 ms
MDC IDC SET LEADCHNL RV SENSING SENSITIVITY: 0.5 mV
MDC IDC STAT BRADY AP VS PERCENT: 1 %
MDC IDC STAT BRADY AS VS PERCENT: 1 %

## 2017-04-05 ENCOUNTER — Encounter: Payer: Self-pay | Admitting: Cardiology

## 2017-04-11 DIAGNOSIS — I10 Essential (primary) hypertension: Secondary | ICD-10-CM | POA: Diagnosis not present

## 2017-04-11 DIAGNOSIS — M545 Low back pain: Secondary | ICD-10-CM | POA: Diagnosis not present

## 2017-04-11 DIAGNOSIS — I679 Cerebrovascular disease, unspecified: Secondary | ICD-10-CM | POA: Diagnosis not present

## 2017-04-11 DIAGNOSIS — D509 Iron deficiency anemia, unspecified: Secondary | ICD-10-CM | POA: Diagnosis not present

## 2017-04-11 DIAGNOSIS — M79669 Pain in unspecified lower leg: Secondary | ICD-10-CM | POA: Diagnosis not present

## 2017-04-11 DIAGNOSIS — I739 Peripheral vascular disease, unspecified: Secondary | ICD-10-CM | POA: Diagnosis not present

## 2017-05-30 ENCOUNTER — Other Ambulatory Visit (HOSPITAL_COMMUNITY): Payer: Self-pay | Admitting: Interventional Radiology

## 2017-05-30 DIAGNOSIS — I771 Stricture of artery: Secondary | ICD-10-CM

## 2017-06-05 ENCOUNTER — Encounter: Payer: Self-pay | Admitting: Family

## 2017-06-07 ENCOUNTER — Encounter (HOSPITAL_COMMUNITY): Payer: Self-pay

## 2017-06-07 ENCOUNTER — Ambulatory Visit (HOSPITAL_COMMUNITY): Payer: Medicare Other

## 2017-06-07 ENCOUNTER — Ambulatory Visit (HOSPITAL_COMMUNITY)
Admission: RE | Admit: 2017-06-07 | Discharge: 2017-06-07 | Disposition: A | Discharge status: Home / Self Care | Payer: Medicare Other | Source: Ambulatory Visit | Attending: Surgery | Admitting: Surgery

## 2017-06-07 DIAGNOSIS — R911 Solitary pulmonary nodule: Secondary | ICD-10-CM | POA: Diagnosis not present

## 2017-06-07 DIAGNOSIS — I6502 Occlusion and stenosis of left vertebral artery: Secondary | ICD-10-CM | POA: Diagnosis not present

## 2017-06-07 DIAGNOSIS — I6523 Occlusion and stenosis of bilateral carotid arteries: Secondary | ICD-10-CM | POA: Diagnosis not present

## 2017-06-07 DIAGNOSIS — I700 Atherosclerosis of aorta: Secondary | ICD-10-CM | POA: Insufficient documentation

## 2017-06-07 DIAGNOSIS — I771 Stricture of artery: Secondary | ICD-10-CM | POA: Diagnosis not present

## 2017-06-07 LAB — POCT I-STAT CREATININE: Creatinine, Ser: 0.7 mg/dL (ref 0.61–1.24)

## 2017-06-07 MED ORDER — IOPAMIDOL (ISOVUE-370) INJECTION 76%
INTRAVENOUS | Status: AC
  Administered 2017-06-07: 50 mL
  Filled 2017-06-07: qty 50

## 2017-06-12 ENCOUNTER — Ambulatory Visit (HOSPITAL_COMMUNITY)
Admission: RE | Admit: 2017-06-12 | Discharge: 2017-06-12 | Disposition: A | Discharge status: Home / Self Care | Payer: Medicare Other | Source: Ambulatory Visit | Attending: Surgery | Admitting: Surgery

## 2017-06-12 ENCOUNTER — Ambulatory Visit (INDEPENDENT_AMBULATORY_CARE_PROVIDER_SITE_OTHER)
Admission: RE | Admit: 2017-06-12 | Discharge: 2017-06-12 | Disposition: A | Discharge status: Home / Self Care | Payer: Medicare Other | Source: Ambulatory Visit | Attending: Surgery | Admitting: Surgery

## 2017-06-12 DIAGNOSIS — I70213 Atherosclerosis of native arteries of extremities with intermittent claudication, bilateral legs: Secondary | ICD-10-CM

## 2017-06-12 DIAGNOSIS — Z95828 Presence of other vascular implants and grafts: Secondary | ICD-10-CM | POA: Diagnosis not present

## 2017-06-12 DIAGNOSIS — R0989 Other specified symptoms and signs involving the circulatory and respiratory systems: Secondary | ICD-10-CM | POA: Diagnosis not present

## 2017-06-12 LAB — VAS US CAROTID
LCCADDIAS: -22 cm/s
LCCADSYS: -117 cm/s
LCCAPDIAS: 20 cm/s
LEFT ECA DIAS: -7 cm/s
LEFT VERTEBRAL DIAS: 23 cm/s
LICADDIAS: -41 cm/s
LICAPDIAS: -28 cm/s
LICAPSYS: -89 cm/s
Left CCA prox sys: 90 cm/s
Left ICA dist sys: -128 cm/s
RCCAPSYS: 73 cm/s
RIGHT CCA MID DIAS: 15 cm/s
RIGHT ECA DIAS: -14 cm/s
RIGHT VERTEBRAL DIAS: -12 cm/s
Right CCA prox dias: 12 cm/s
Right cca dist sys: -87 cm/s

## 2017-06-12 LAB — VAS US AORTA/IVC/ILIACS: Right super femoral prox sys PSV: -323 cm/s

## 2017-06-17 ENCOUNTER — Ambulatory Visit (INDEPENDENT_AMBULATORY_CARE_PROVIDER_SITE_OTHER): Payer: Medicare Other | Admitting: Surgery

## 2017-06-17 ENCOUNTER — Other Ambulatory Visit: Payer: Self-pay

## 2017-06-17 ENCOUNTER — Encounter: Payer: Self-pay | Admitting: Surgery

## 2017-06-17 VITALS — BP 143/80 | HR 64 | Temp 97.3°F | Resp 18 | Ht 63.0 in | Wt 157.0 lb

## 2017-06-17 DIAGNOSIS — I255 Ischemic cardiomyopathy: Secondary | ICD-10-CM | POA: Diagnosis not present

## 2017-06-17 DIAGNOSIS — I70213 Atherosclerosis of native arteries of extremities with intermittent claudication, bilateral legs: Secondary | ICD-10-CM

## 2017-06-17 NOTE — Progress Notes (Signed)
[facility] [place]  Patient name: Roy [name] MRN: [id number] DOB: 19-Dec-1945 Sex: male   REASON FOR VISIT:    claudication  HISOTRY OF PRESENT ILLNESS:    This is a 71 year old gentleman who returns today for follow-up of his claudication symptoms.  He is status post bilateral iliac stenting in 2015.  This was done for lifestyle limiting claudication.  An 8 x 40 self extending stent was placed on the left and 8 x 80 on the right.  He had moderate left superficial femoral artery stenosis within the adductor canal and right common femoral artery stenosis.  The patient had been doing well up until about 3 months ago.  At that time he developed claudication symptoms at 100 feet that were alleviated with rest.  He initially has pain in his calf but then this progresses to his hips.  The patient continues to smoke.  He is on aspirin and Plavix for a intracranial stent.  He is medically managed for hypercholesterolemia with a statin.  His blood pressure is medically managed.   PAST MEDICAL HISTORY:   Past Medical History:  Diagnosis Date  . AICD (automatic cardioverter/defibrillator) present 08/19/2015   St Jude BiV ICD for primary prevention by Dr. Lovena Le  . Alcohol abuse    6 beers per day; hospital admission in 2009 for withdrawal symptoms  . Alcoholic cirrhosis ([place])   . Anxiety and depression   . Cerebrovascular disease 2009   TIA; 2009- right ICA stent; re-intervention for restenosis complicated by [facility] w/o sx  . CHF (congestive heart failure) ([place]) 06-02-14  . Chronic obstructive pulmonary disease ([place])   . Degenerative joint disease    Total shoulder arthroplasty-right  . Hyperlipidemia   . Hypertension   . LBBB (left bundle branch block)    Normal echo-2011; stress nuclear in 09/2010--septal hypoperfusion representing nontransmural infarction or the effect of left bundle branch block, no ischemia  . Myocardial  infarction [facility]) June 02, 2014   Massive Heart Attack  . Peripheral vascular disease ([place])   . 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  . Thrombocytopenia ([place])   . Tobacco abuse    -100 pack years; 1.5 packs per day  . Tubular adenoma of colon      FAMILY HISTORY:   Family History  Problem Relation Age of Onset  . Coronary artery disease Mother   . Diabetes Mother   . Heart disease Mother        Before age 64 - 28 Bypasses  . Hypertension Mother   . Heart attack Mother        3-4 Heart attacks  . Alzheimer's disease Father   . Diabetes Father   . Heart attack Sister   . Cancer Brother        Prostate  . Hyperlipidemia Son   . Prostate cancer Brother   . Alzheimer's disease Sister   . Colon cancer Neg Hx     SOCIAL HISTORY:   Social History  Substance Use Topics  . Smoking status: Current Every Day Smoker    Packs/day: 1.00    Years: 60.00    Types: E-cigarettes, Cigarettes    Start date: 08/20/1957  . Smokeless tobacco: Never Used  . Alcohol use No     Comment: quit in July 2015; No ETOH to date 02/14/15     ALLERGIES:   No Known Allergies   CURRENT MEDICATIONS:   Current Outpatient Prescriptions  Medication Sig Dispense Refill  .  aspirin EC 81 MG tablet Take 81 mg by mouth every evening.     [name] atorvastatin (LIPITOR) 20 MG tablet Take 20 mg by mouth daily.     . carvedilol (COREG) 3.125 MG tablet Take 1 tablet (3.125 mg total) by mouth 2 (two) times daily. 180 tablet 3  . cetirizine (ZYRTEC) 10 MG tablet Take 10 mg by mouth daily.    . clopidogrel (PLAVIX) 75 MG tablet Take 75 mg by mouth every evening.     . cyanocobalamin 2000 MCG tablet Take 2,000 mcg by mouth daily.    [name] ENTRESTO 49-51 MG TAKE 1 TABLET TWICE A DAY 235 tablet 3  . folic acid (FOLVITE) 1 MG tablet Take 1 tablet (1 mg total) by mouth daily.    . furosemide (LASIX) 20 MG tablet Take 20 mg by mouth daily.     [name] gabapentin (NEURONTIN) 300 MG capsule Take by mouth.    . Iron-FA-B  Cmp-C-Biot-Probiotic (FUSION PLUS) CAPS Take 1 capsule by mouth daily.     . Multiple Vitamins-Minerals (CENTRUM SILVER PO) Take 0.5 tablets by mouth 2 (two) times daily.     . naproxen (NAPROSYN) 500 MG tablet Take 1 tablet (500 mg total) by mouth 2 (two) times daily with a meal. (Patient taking differently: Take 500 mg by mouth as needed. ) 60 tablet 5  . pantoprazole (PROTONIX) 40 MG tablet TAKE 1 TABLET DAILY 90 tablet 3  . PARoxetine (PAXIL) 20 MG tablet Take 20 mg by mouth daily.    . potassium chloride SA (K-DUR,KLOR-CON) 20 MEQ tablet Take 20 mEq by mouth daily.    [name] pyridOXINE (VITAMIN B-6) 100 MG tablet Take 100 mg by mouth daily.    . vitamin C (ASCORBIC ACID) 500 MG tablet Take 500 mg by mouth daily.     No current facility-administered medications for this visit.     REVIEW OF SYSTEMS:   [X]  denotes positive finding, [ ]  denotes negative finding Cardiac  Comments:  Chest pain or chest pressure:    Shortness of breath upon exertion:    Short of breath when lying flat:    Irregular heart rhythm:        Vascular    Pain in calf, thigh, or hip brought on by ambulation:    Pain in feet at night that wakes you up from your sleep:     Blood clot in your veins:    Leg swelling:         Pulmonary    Oxygen at home:    Productive cough:     Wheezing:         Neurologic    Sudden weakness in arms or legs:     Sudden numbness in arms or legs:     Sudden onset of difficulty speaking or slurred speech:    Temporary loss of vision in one eye:     Problems with dizziness:         Gastrointestinal    Blood in stool:     Vomited blood:         Genitourinary    Burning when urinating:     Blood in urine:        Psychiatric    Major depression:         Hematologic    Bleeding problems:    Problems with blood clotting too easily:        Skin    Rashes or ulcers:  Constitutional    Fever or chills:      PHYSICAL EXAM:   Vitals:   06/17/17 0850  BP: (!)  143/80  Pulse: 64  Resp: 18  Temp: (!) 97.3 F (36.3 C)  SpO2: 98%  Weight: 157 lb (71.2 kg)  Height: 5\' 3"  (1.6 m)    GENERAL: The patient is a well-nourished male, in no acute distress. The vital signs are documented above. CARDIAC: There is a regular rate and rhythm.  VASCULAR: Palpable femoral pulses.  Nonpalpable pedal pulses PULMONARY: Non-labored respirations ABDOMEN: Soft and non-tender with normal pitched bowel sounds.  MUSCULOSKELETAL: There are no major deformities or cyanosis. NEUROLOGIC: No focal weakness or paresthesias are detected. SKIN: There are no ulcers or rashes noted. PSYCHIATRIC: The patient has a normal affect.  STUDIES:   Have ordered and reviewed his ABIs.  Right is 0.76.  Left is 0.63.  Previously the right was 0.9 and the left was 0.69  MEDICAL ISSUES:   Lifestyle limiting claudication: The patient is having significant difficulty getting around with pain in his calf and hip with walking.  I discussed the importance of smoking cessation.  We also have scheduled going back for angiography on Tuesday, July 31.  This will be through a right femoral approach.  Intervention will be performed as indicated.  He understands that if this is disease within his adductor canal that he may require staged treatment.  If this is in-stent stenosis he could potentially be treated in one setting.    Annamarie Major, MD [facility] [phone] Pager [phone]

## 2017-06-25 ENCOUNTER — Ambulatory Visit (HOSPITAL_COMMUNITY)
Admission: RE | Admit: 2017-06-25 | Discharge: 2017-06-25 | Disposition: A | Discharge status: Home / Self Care | Payer: Medicare Other | Source: Ambulatory Visit | Attending: Surgery | Admitting: Surgery

## 2017-06-25 ENCOUNTER — Other Ambulatory Visit: Payer: Self-pay

## 2017-06-25 ENCOUNTER — Encounter (HOSPITAL_COMMUNITY)
Admission: RE | Disposition: A | Discharge status: Home / Self Care | Payer: Self-pay | Source: Ambulatory Visit | Attending: Surgery

## 2017-06-25 DIAGNOSIS — Z7982 Long term (current) use of aspirin: Secondary | ICD-10-CM | POA: Insufficient documentation

## 2017-06-25 DIAGNOSIS — Z8673 Personal history of transient ischemic attack (TIA), and cerebral infarction without residual deficits: Secondary | ICD-10-CM | POA: Diagnosis not present

## 2017-06-25 DIAGNOSIS — I252 Old myocardial infarction: Secondary | ICD-10-CM | POA: Diagnosis not present

## 2017-06-25 DIAGNOSIS — Z01812 Encounter for preprocedural laboratory examination: Secondary | ICD-10-CM

## 2017-06-25 DIAGNOSIS — I110 Hypertensive heart disease with heart failure: Secondary | ICD-10-CM | POA: Insufficient documentation

## 2017-06-25 DIAGNOSIS — Y831 Surgical operation with implant of artificial internal device as the cause of abnormal reaction of the patient, or of later complication, without mention of misadventure at the time of the procedure: Secondary | ICD-10-CM | POA: Diagnosis not present

## 2017-06-25 DIAGNOSIS — F419 Anxiety disorder, unspecified: Secondary | ICD-10-CM | POA: Diagnosis not present

## 2017-06-25 DIAGNOSIS — I739 Peripheral vascular disease, unspecified: Secondary | ICD-10-CM

## 2017-06-25 DIAGNOSIS — F329 Major depressive disorder, single episode, unspecified: Secondary | ICD-10-CM | POA: Diagnosis not present

## 2017-06-25 DIAGNOSIS — I70213 Atherosclerosis of native arteries of extremities with intermittent claudication, bilateral legs: Secondary | ICD-10-CM | POA: Diagnosis not present

## 2017-06-25 DIAGNOSIS — Z95810 Presence of automatic (implantable) cardiac defibrillator: Secondary | ICD-10-CM | POA: Insufficient documentation

## 2017-06-25 DIAGNOSIS — Z7902 Long term (current) use of antithrombotics/antiplatelets: Secondary | ICD-10-CM | POA: Insufficient documentation

## 2017-06-25 DIAGNOSIS — I509 Heart failure, unspecified: Secondary | ICD-10-CM | POA: Diagnosis not present

## 2017-06-25 DIAGNOSIS — K7030 Alcoholic cirrhosis of liver without ascites: Secondary | ICD-10-CM | POA: Insufficient documentation

## 2017-06-25 DIAGNOSIS — M1990 Unspecified osteoarthritis, unspecified site: Secondary | ICD-10-CM | POA: Diagnosis not present

## 2017-06-25 DIAGNOSIS — F17210 Nicotine dependence, cigarettes, uncomplicated: Secondary | ICD-10-CM | POA: Insufficient documentation

## 2017-06-25 DIAGNOSIS — J449 Chronic obstructive pulmonary disease, unspecified: Secondary | ICD-10-CM | POA: Diagnosis not present

## 2017-06-25 DIAGNOSIS — I447 Left bundle-branch block, unspecified: Secondary | ICD-10-CM | POA: Insufficient documentation

## 2017-06-25 DIAGNOSIS — T82856A Stenosis of peripheral vascular stent, initial encounter: Secondary | ICD-10-CM | POA: Diagnosis not present

## 2017-06-25 DIAGNOSIS — E7800 Pure hypercholesterolemia, unspecified: Secondary | ICD-10-CM | POA: Diagnosis not present

## 2017-06-25 HISTORY — PX: LOWER EXTREMITY ANGIOGRAPHY: CATH118251

## 2017-06-25 HISTORY — PX: ABDOMINAL AORTOGRAM: CATH118222

## 2017-06-25 LAB — POCT I-STAT, CHEM 8
BUN: 12 mg/dL (ref 6–20)
CHLORIDE: 101 mmol/L (ref 101–111)
Calcium, Ion: 1.09 mmol/L — ABNORMAL LOW (ref 1.15–1.40)
Creatinine, Ser: 0.6 mg/dL — ABNORMAL LOW (ref 0.61–1.24)
Glucose, Bld: 111 mg/dL — ABNORMAL HIGH (ref 65–99)
HEMATOCRIT: 43 % (ref 39.0–52.0)
Hemoglobin: 14.6 g/dL (ref 13.0–17.0)
POTASSIUM: 4 mmol/L (ref 3.5–5.1)
SODIUM: 138 mmol/L (ref 135–145)
TCO2: 26 mmol/L (ref 0–100)

## 2017-06-25 SURGERY — ABDOMINAL AORTOGRAM
Anesthesia: LOCAL

## 2017-06-25 MED ORDER — ACETAMINOPHEN 325 MG RE SUPP: 325.0000 mg | RECTAL | Status: DC | PRN

## 2017-06-25 MED ORDER — ACETAMINOPHEN 325 MG PO TABS: 325.0000 mg | ORAL_TABLET | ORAL | Status: DC | PRN

## 2017-06-25 MED ORDER — LIDOCAINE HCL (PF) 1 % IJ SOLN
INTRAMUSCULAR | Status: DC | PRN
  Administered 2017-06-25: 13 mL

## 2017-06-25 MED ORDER — MIDAZOLAM HCL 2 MG/2ML IJ SOLN
INTRAMUSCULAR | Status: DC | PRN
  Administered 2017-06-25: 1 mg via INTRAVENOUS

## 2017-06-25 MED ORDER — SODIUM CHLORIDE 0.9 % IV SOLN: 1.0000 mL/kg/h | INTRAVENOUS | Status: DC

## 2017-06-25 MED ORDER — GUAIFENESIN-DM 100-10 MG/5ML PO SYRP: 15.0000 mL | ORAL_SOLUTION | ORAL | Status: DC | PRN

## 2017-06-25 MED ORDER — MORPHINE SULFATE (PF) 10 MG/ML IV SOLN: 2.0000 mg | INTRAVENOUS | Status: DC | PRN

## 2017-06-25 MED ORDER — ALUM & MAG HYDROXIDE-SIMETH 200-200-20 MG/5ML PO SUSP: 15.0000 mL | ORAL | Status: DC | PRN

## 2017-06-25 MED ORDER — LABETALOL HCL 5 MG/ML IV SOLN: 10.0000 mg | INTRAVENOUS | Status: DC | PRN

## 2017-06-25 MED ORDER — OXYCODONE HCL 5 MG PO TABS: 5.0000 mg | ORAL_TABLET | ORAL | Status: DC | PRN

## 2017-06-25 MED ORDER — DOCUSATE SODIUM 100 MG PO CAPS: 100.0000 mg | ORAL_CAPSULE | Freq: Every day | ORAL | Status: DC

## 2017-06-25 MED ORDER — FENTANYL CITRATE (PF) 100 MCG/2ML IJ SOLN
INTRAMUSCULAR | Status: DC | PRN
Start: 2017-06-25 — End: 2017-06-25
  Administered 2017-06-25: 25 ug via INTRAVENOUS

## 2017-06-25 MED ORDER — HYDRALAZINE HCL 20 MG/ML IJ SOLN: 5.0000 mg | INTRAMUSCULAR | Status: DC | PRN

## 2017-06-25 MED ORDER — SODIUM CHLORIDE 0.9 % IV SOLN
INTRAVENOUS | Status: DC
  Administered 2017-06-25: 06:00:00 via INTRAVENOUS

## 2017-06-25 MED ORDER — PHENOL 1.4 % MT LIQD: 1.0000 | OROMUCOSAL | Status: DC | PRN

## 2017-06-25 MED ORDER — SODIUM CHLORIDE 0.9 % IV SOLN: 500.0000 mL | Freq: Once | INTRAVENOUS | Status: DC | PRN

## 2017-06-25 MED ORDER — MIDAZOLAM HCL 2 MG/2ML IJ SOLN
INTRAMUSCULAR | Status: AC
  Filled 2017-06-25: qty 2

## 2017-06-25 MED ORDER — METOPROLOL TARTRATE 5 MG/5ML IV SOLN: 2.0000 mg | INTRAVENOUS | Status: DC | PRN

## 2017-06-25 MED ORDER — HEPARIN (PORCINE) IN NACL 2-0.9 UNIT/ML-% IJ SOLN
INTRAMUSCULAR | Status: AC
  Filled 2017-06-25: qty 1000

## 2017-06-25 MED ORDER — IODIXANOL 320 MG/ML IV SOLN
INTRAVENOUS | Status: DC | PRN
  Administered 2017-06-25: 130 mL via INTRAVENOUS

## 2017-06-25 MED ORDER — LIDOCAINE HCL (PF) 1 % IJ SOLN
INTRAMUSCULAR | Status: AC
  Filled 2017-06-25: qty 30

## 2017-06-25 MED ORDER — ONDANSETRON HCL 4 MG/2ML IJ SOLN: 4.0000 mg | Freq: Four times a day (QID) | INTRAMUSCULAR | Status: DC | PRN

## 2017-06-25 MED ORDER — FENTANYL CITRATE (PF) 100 MCG/2ML IJ SOLN
INTRAMUSCULAR | Status: AC
  Filled 2017-06-25: qty 2

## 2017-06-25 SURGICAL SUPPLY — 11 items
CATH OMNI FLUSH 5F 65CM (CATHETERS) ×1 IMPLANT
COVER PRB 48X5XTLSCP FOLD TPE (BAG) IMPLANT
COVER PROBE 5X48 (BAG) ×3
DRAPE ZERO GRAVITY STERILE (DRAPES) ×1 IMPLANT
KIT MICROINTRODUCER STIFF 5F (SHEATH) ×1 IMPLANT
KIT PV (KITS) ×3 IMPLANT
SHEATH PINNACLE 5F 10CM (SHEATH) ×1 IMPLANT
SYR MEDRAD MARK V 150ML (SYRINGE) ×3 IMPLANT
TRANSDUCER W/STOPCOCK (MISCELLANEOUS) ×3 IMPLANT
TRAY PV CATH (CUSTOM PROCEDURE TRAY) ×3 IMPLANT
WIRE BENTSON .035X145CM (WIRE) ×1 IMPLANT

## 2017-06-25 NOTE — H&P (View-Only) (Signed)
[facility] [place]  Patient name: Roy [name] MRN: [id number] DOB: 1946-07-06 Sex: male   REASON FOR VISIT:    claudication  HISOTRY OF PRESENT ILLNESS:    This is a 71 year old gentleman who returns today for follow-up of his claudication symptoms.  He is status post bilateral iliac stenting in 2015.  This was done for lifestyle limiting claudication.  An 8 x 40 self extending stent was placed on the left and 8 x 80 on the right.  He had moderate left superficial femoral artery stenosis within the adductor canal and right common femoral artery stenosis.  The patient had been doing well up until about 3 months ago.  At that time he developed claudication symptoms at 100 feet that were alleviated with rest.  He initially has pain in his calf but then this progresses to his hips.  The patient continues to smoke.  He is on aspirin and Plavix for a intracranial stent.  He is medically managed for hypercholesterolemia with a statin.  His blood pressure is medically managed.   PAST MEDICAL HISTORY:   Past Medical History:  Diagnosis Date  . AICD (automatic cardioverter/defibrillator) present 08/19/2015   St Jude BiV ICD for primary prevention by Dr. Lovena Le  . Alcohol abuse    6 beers per day; hospital admission in 2009 for withdrawal symptoms  . Alcoholic cirrhosis ([place])   . Anxiety and depression   . Cerebrovascular disease 2009   TIA; 2009- right ICA stent; re-intervention for restenosis complicated by [facility] w/o sx  . CHF (congestive heart failure) ([place]) 06-02-14  . Chronic obstructive pulmonary disease ([place])   . Degenerative joint disease    Total shoulder arthroplasty-right  . Hyperlipidemia   . Hypertension   . LBBB (left bundle branch block)    Normal echo-2011; stress nuclear in 09/2010--septal hypoperfusion representing nontransmural infarction or the effect of left bundle branch block, no ischemia  . Myocardial  infarction [facility]) June 02, 2014   Massive Heart Attack  . Peripheral vascular disease ([place])   . 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  . Thrombocytopenia ([place])   . Tobacco abuse    -100 pack years; 1.5 packs per day  . Tubular adenoma of colon      FAMILY HISTORY:   Family History  Problem Relation Age of Onset  . Coronary artery disease Mother   . Diabetes Mother   . Heart disease Mother        Before age 30 - 58 Bypasses  . Hypertension Mother   . Heart attack Mother        3-4 Heart attacks  . Alzheimer's disease Father   . Diabetes Father   . Heart attack Sister   . Cancer Brother        Prostate  . Hyperlipidemia Son   . Prostate cancer Brother   . Alzheimer's disease Sister   . Colon cancer Neg Hx     SOCIAL HISTORY:   Social History  Substance Use Topics  . Smoking status: Current Every Day Smoker    Packs/day: 1.00    Years: 60.00    Types: E-cigarettes, Cigarettes    Start date: 08/20/1957  . Smokeless tobacco: Never Used  . Alcohol use No     Comment: quit in July 2015; No ETOH to date 02/14/15     ALLERGIES:   No Known Allergies   CURRENT MEDICATIONS:   Current Outpatient Prescriptions  Medication Sig Dispense Refill  .  aspirin EC 81 MG tablet Take 81 mg by mouth every evening.     [name] atorvastatin (LIPITOR) 20 MG tablet Take 20 mg by mouth daily.     . carvedilol (COREG) 3.125 MG tablet Take 1 tablet (3.125 mg total) by mouth 2 (two) times daily. 180 tablet 3  . cetirizine (ZYRTEC) 10 MG tablet Take 10 mg by mouth daily.    . clopidogrel (PLAVIX) 75 MG tablet Take 75 mg by mouth every evening.     . cyanocobalamin 2000 MCG tablet Take 2,000 mcg by mouth daily.    [name] ENTRESTO 49-51 MG TAKE 1 TABLET TWICE A DAY 694 tablet 3  . folic acid (FOLVITE) 1 MG tablet Take 1 tablet (1 mg total) by mouth daily.    . furosemide (LASIX) 20 MG tablet Take 20 mg by mouth daily.     [name] gabapentin (NEURONTIN) 300 MG capsule Take by mouth.    . Iron-FA-B  Cmp-C-Biot-Probiotic (FUSION PLUS) CAPS Take 1 capsule by mouth daily.     . Multiple Vitamins-Minerals (CENTRUM SILVER PO) Take 0.5 tablets by mouth 2 (two) times daily.     . naproxen (NAPROSYN) 500 MG tablet Take 1 tablet (500 mg total) by mouth 2 (two) times daily with a meal. (Patient taking differently: Take 500 mg by mouth as needed. ) 60 tablet 5  . pantoprazole (PROTONIX) 40 MG tablet TAKE 1 TABLET DAILY 90 tablet 3  . PARoxetine (PAXIL) 20 MG tablet Take 20 mg by mouth daily.    . potassium chloride SA (K-DUR,KLOR-CON) 20 MEQ tablet Take 20 mEq by mouth daily.    [name] pyridOXINE (VITAMIN B-6) 100 MG tablet Take 100 mg by mouth daily.    . vitamin C (ASCORBIC ACID) 500 MG tablet Take 500 mg by mouth daily.     No current facility-administered medications for this visit.     REVIEW OF SYSTEMS:   [X]  denotes positive finding, [ ]  denotes negative finding Cardiac  Comments:  Chest pain or chest pressure:    Shortness of breath upon exertion:    Short of breath when lying flat:    Irregular heart rhythm:        Vascular    Pain in calf, thigh, or hip brought on by ambulation:    Pain in feet at night that wakes you up from your sleep:     Blood clot in your veins:    Leg swelling:         Pulmonary    Oxygen at home:    Productive cough:     Wheezing:         Neurologic    Sudden weakness in arms or legs:     Sudden numbness in arms or legs:     Sudden onset of difficulty speaking or slurred speech:    Temporary loss of vision in one eye:     Problems with dizziness:         Gastrointestinal    Blood in stool:     Vomited blood:         Genitourinary    Burning when urinating:     Blood in urine:        Psychiatric    Major depression:         Hematologic    Bleeding problems:    Problems with blood clotting too easily:        Skin    Rashes or ulcers:  Constitutional    Fever or chills:      PHYSICAL EXAM:   Vitals:   06/17/17 0850  BP: (!)  143/80  Pulse: 64  Resp: 18  Temp: (!) 97.3 F (36.3 C)  SpO2: 98%  Weight: 157 lb (71.2 kg)  Height: 5\' 3"  (1.6 m)    GENERAL: The patient is a well-nourished male, in no acute distress. The vital signs are documented above. CARDIAC: There is a regular rate and rhythm.  VASCULAR: Palpable femoral pulses.  Nonpalpable pedal pulses PULMONARY: Non-labored respirations ABDOMEN: Soft and non-tender with normal pitched bowel sounds.  MUSCULOSKELETAL: There are no major deformities or cyanosis. NEUROLOGIC: No focal weakness or paresthesias are detected. SKIN: There are no ulcers or rashes noted. PSYCHIATRIC: The patient has a normal affect.  STUDIES:   Have ordered and reviewed his ABIs.  Right is 0.76.  Left is 0.63.  Previously the right was 0.9 and the left was 0.69  MEDICAL ISSUES:   Lifestyle limiting claudication: The patient is having significant difficulty getting around with pain in his calf and hip with walking.  I discussed the importance of smoking cessation.  We also have scheduled going back for angiography on Tuesday, July 31.  This will be through a right femoral approach.  Intervention will be performed as indicated.  He understands that if this is disease within his adductor canal that he may require staged treatment.  If this is in-stent stenosis he could potentially be treated in one setting.    Annamarie Major, MD [facility] [phone] Pager [phone]

## 2017-06-25 NOTE — Progress Notes (Addendum)
Site area: RFA Site Prior to Removal:  Level 0 Pressure Applied For: 25 min Manual: yes   Patient Status During Pull:  stable Post Pull Site:  Level 0 Post Pull Instructions Given:  yes Post Pull Pulses Present:palpable  Dressing Applied:  tegaderm Bedrest begins @ 6256 till 1250 Comments:

## 2017-06-25 NOTE — Discharge Instructions (Signed)

## 2017-06-25 NOTE — Interval H&P Note (Signed)
History and Physical Interval Note:  06/25/2017 7:25 AM  Roy [name]  has presented today for surgery, with the diagnosis of pvd with claudication  The various methods of treatment have been discussed with the patient and family. After consideration of risks, benefits and other options for treatment, the patient has consented to  Procedure(s): Abdominal Aortogram w/Lower Extremity ([name]) as a surgical intervention .  The patient's history has been reviewed, patient examined, no change in status, stable for surgery.  I have reviewed the patient's chart and labs.  Questions were answered to the patient's satisfaction.     Annamarie Major

## 2017-06-25 NOTE — Op Note (Signed)
    Patient name: Roy [name] MRN: [id number] DOB: Apr 03, 1946 Sex: male  06/25/2017 Pre-operative Diagnosis: Bilateral claudication, left greater than right Post-operative diagnosis:  Same Surgeon:  Annamarie Major Procedure Performed:  1.  Ultrasound-guided access, right femoral artery  2.  Abdominal aortogram  3.  Bilateral lower extremity runoff  4.  Conscious sedation (23 minutes)    Indications:  The patient has a history of bilateral iliac stents, 8 mm.  He has developed progressive symptoms and is legs, left greater than right.  He is here today for further evaluation.  Procedure:  The patient was identified in the holding area and taken to room 8.  The patient was then placed supine on the table and prepped and draped in the usual sterile fashion.  A time out was called.  Conscious sedation was a minister with the use of IV fentanyl and Versed under continuous physician and nurse monitoring.  Heart rate, blood pressure, and oxygen saturations were continuous to monitor.  Ultrasound was used to evaluate the right common femoral artery.  It was patent .  A digital ultrasound image was acquired.  A micropuncture needle was used to access the right common femoral artery under ultrasound guidance.  An 018 wire was advanced without resistance and a micropuncture sheath was placed.  The 018 wire was removed and a benson wire was placed.  The micropuncture sheath was exchanged for a 5 french sheath.  An omniflush catheter was advanced over the wire to the level of L-1.  An abdominal angiogram was obtained.  The catheter was pulled down to the aortic bifurcation and pelvic angiography was performed and both obliques.  This was followed by bilateral runoff.  Findings:   Aortogram:  No significant renal artery stenosis.  No significant aortic stenosis.  Lateral common and external iliac arteries are widely patent.  The stent within the bilateral iliac arteries are widely patent.  Bilateral  hypogastric artery stenosis, right greater than left is noted.  Right Lower Extremity:  There is stenosis in the distal right common femoral artery.  Superficial femoral and profunda femoral artery are patent however there is significant stenosis within the adductor canal.  The popliteal artery is patent throughout course three-vessel runoff.  Left Lower Extremity:  The proximal left superficial femoral and left profunda femoral artery were heavily calcified with significant stenosis.  The common femoral artery is patent.  It is occlusion of the superficial femoral artery and the adductor canal.  There is reconstitution of the above-knee popliteal artery which is patent throughout it's course.  There is three-vessel runoff.  Intervention:  None  Impression:  #1  bilateral iliac stents are widely patent  #2  stenosis in the distal right common femoral artery and right superficial femoral artery at the adductor canal  #3  high-grade stenosis in the left proximal superficial femoral and profunda femoral artery as well as occlusion of the left superficial femoral artery  #4  the patient will be brought back for discussions regarding left superficial femoral and profunda femoral endarterectomy with either above-knee popliteal artery bypass or an attempt at percutaneous recanalization after femoral endarterectomy   V. Annamarie Major, M.D. [facility] Office: [phone] Pager:  [phone]

## 2017-06-26 ENCOUNTER — Encounter (HOSPITAL_COMMUNITY): Payer: Self-pay | Admitting: Surgery

## 2017-07-01 ENCOUNTER — Ambulatory Visit (INDEPENDENT_AMBULATORY_CARE_PROVIDER_SITE_OTHER): Payer: Medicare Other | Admitting: *Deleted

## 2017-07-01 DIAGNOSIS — I255 Ischemic cardiomyopathy: Secondary | ICD-10-CM | POA: Diagnosis not present

## 2017-07-02 ENCOUNTER — Encounter (HOSPITAL_COMMUNITY): Payer: Medicare Other

## 2017-07-02 ENCOUNTER — Ambulatory Visit: Payer: Medicare Other | Admitting: Family

## 2017-07-02 DIAGNOSIS — R339 Retention of urine, unspecified: Secondary | ICD-10-CM | POA: Diagnosis not present

## 2017-07-02 DIAGNOSIS — Z6825 Body mass index (BMI) 25.0-25.9, adult: Secondary | ICD-10-CM | POA: Diagnosis not present

## 2017-07-02 NOTE — Progress Notes (Signed)
Remote ICD transmission.   

## 2017-07-03 ENCOUNTER — Encounter: Payer: Self-pay | Admitting: Cardiology

## 2017-07-05 DIAGNOSIS — N401 Enlarged prostate with lower urinary tract symptoms: Secondary | ICD-10-CM | POA: Diagnosis not present

## 2017-07-05 DIAGNOSIS — Z6828 Body mass index (BMI) 28.0-28.9, adult: Secondary | ICD-10-CM | POA: Diagnosis not present

## 2017-07-05 DIAGNOSIS — R339 Retention of urine, unspecified: Secondary | ICD-10-CM | POA: Diagnosis not present

## 2017-07-05 LAB — CUP PACEART REMOTE DEVICE CHECK
Battery Remaining Longevity: 60 mo
Battery Remaining Percentage: 75 %
Battery Voltage: 2.96 V
Brady Statistic AP VP Percent: 25 %
Brady Statistic AS VP Percent: 74 %
Brady Statistic RA Percent Paced: 25 %
HighPow Impedance: 78 Ohm
HighPow Impedance: 78 Ohm
Implantable Lead Implant Date: 20160923
Implantable Lead Implant Date: 20160923
Implantable Lead Location: 753859
Implantable Lead Location: 753860
Implantable Lead Model: 7122
Lead Channel Impedance Value: 410 Ohm
Lead Channel Impedance Value: 430 Ohm
Lead Channel Impedance Value: 710 Ohm
Lead Channel Pacing Threshold Amplitude: 0.75 V
Lead Channel Pacing Threshold Amplitude: 1 V
Lead Channel Pacing Threshold Pulse Width: 0.5 ms
Lead Channel Pacing Threshold Pulse Width: 0.8 ms
Lead Channel Sensing Intrinsic Amplitude: 12 mV
Lead Channel Setting Pacing Amplitude: 2 V
Lead Channel Setting Pacing Pulse Width: 0.5 ms
Lead Channel Setting Pacing Pulse Width: 0.8 ms
MDC IDC LEAD IMPLANT DT: 20160923
MDC IDC LEAD LOCATION: 753858
MDC IDC MSMT LEADCHNL LV PACING THRESHOLD AMPLITUDE: 1 V
MDC IDC MSMT LEADCHNL RA PACING THRESHOLD PULSEWIDTH: 0.5 ms
MDC IDC MSMT LEADCHNL RA SENSING INTR AMPL: 4.4 mV
MDC IDC PG IMPLANT DT: 20160923
MDC IDC PG SERIAL: 7294918
MDC IDC SESS DTM: 20180806060028
MDC IDC SET LEADCHNL LV PACING AMPLITUDE: 2.25 V
MDC IDC SET LEADCHNL RV PACING AMPLITUDE: 2 V
MDC IDC SET LEADCHNL RV SENSING SENSITIVITY: 0.5 mV
MDC IDC STAT BRADY AP VS PERCENT: 1 %
MDC IDC STAT BRADY AS VS PERCENT: 1 %

## 2017-07-10 ENCOUNTER — Encounter: Payer: Self-pay | Admitting: Family

## 2017-07-11 ENCOUNTER — Other Ambulatory Visit (HOSPITAL_COMMUNITY): Payer: Self-pay | Admitting: Interventional Radiology

## 2017-07-11 DIAGNOSIS — I771 Stricture of artery: Secondary | ICD-10-CM

## 2017-07-15 ENCOUNTER — Other Ambulatory Visit: Payer: Self-pay | Admitting: Radiology

## 2017-07-16 ENCOUNTER — Ambulatory Visit (HOSPITAL_COMMUNITY)
Admission: RE | Admit: 2017-07-16 | Discharge: 2017-07-16 | Disposition: A | Discharge status: Home / Self Care | Payer: Medicare Other | Source: Ambulatory Visit | Attending: Interventional Radiology | Admitting: Interventional Radiology

## 2017-07-16 ENCOUNTER — Encounter (HOSPITAL_COMMUNITY): Payer: Self-pay

## 2017-07-16 ENCOUNTER — Other Ambulatory Visit (HOSPITAL_COMMUNITY): Payer: Self-pay | Admitting: Interventional Radiology

## 2017-07-16 DIAGNOSIS — I771 Stricture of artery: Secondary | ICD-10-CM

## 2017-07-16 DIAGNOSIS — I6503 Occlusion and stenosis of bilateral vertebral arteries: Secondary | ICD-10-CM | POA: Diagnosis not present

## 2017-07-16 DIAGNOSIS — F17210 Nicotine dependence, cigarettes, uncomplicated: Secondary | ICD-10-CM | POA: Insufficient documentation

## 2017-07-16 DIAGNOSIS — Z8673 Personal history of transient ischemic attack (TIA), and cerebral infarction without residual deficits: Secondary | ICD-10-CM | POA: Diagnosis not present

## 2017-07-16 DIAGNOSIS — I509 Heart failure, unspecified: Secondary | ICD-10-CM | POA: Diagnosis not present

## 2017-07-16 DIAGNOSIS — I6523 Occlusion and stenosis of bilateral carotid arteries: Secondary | ICD-10-CM | POA: Diagnosis not present

## 2017-07-16 DIAGNOSIS — I6521 Occlusion and stenosis of right carotid artery: Secondary | ICD-10-CM | POA: Diagnosis not present

## 2017-07-16 DIAGNOSIS — I739 Peripheral vascular disease, unspecified: Secondary | ICD-10-CM | POA: Diagnosis not present

## 2017-07-16 DIAGNOSIS — E785 Hyperlipidemia, unspecified: Secondary | ICD-10-CM | POA: Insufficient documentation

## 2017-07-16 DIAGNOSIS — I679 Cerebrovascular disease, unspecified: Secondary | ICD-10-CM | POA: Insufficient documentation

## 2017-07-16 DIAGNOSIS — F329 Major depressive disorder, single episode, unspecified: Secondary | ICD-10-CM | POA: Diagnosis not present

## 2017-07-16 DIAGNOSIS — J449 Chronic obstructive pulmonary disease, unspecified: Secondary | ICD-10-CM | POA: Diagnosis not present

## 2017-07-16 DIAGNOSIS — I252 Old myocardial infarction: Secondary | ICD-10-CM | POA: Diagnosis not present

## 2017-07-16 DIAGNOSIS — Z7902 Long term (current) use of antithrombotics/antiplatelets: Secondary | ICD-10-CM | POA: Diagnosis not present

## 2017-07-16 DIAGNOSIS — I6601 Occlusion and stenosis of right middle cerebral artery: Secondary | ICD-10-CM | POA: Diagnosis not present

## 2017-07-16 DIAGNOSIS — G450 Vertebro-basilar artery syndrome: Secondary | ICD-10-CM | POA: Insufficient documentation

## 2017-07-16 DIAGNOSIS — Z95810 Presence of automatic (implantable) cardiac defibrillator: Secondary | ICD-10-CM | POA: Insufficient documentation

## 2017-07-16 DIAGNOSIS — I447 Left bundle-branch block, unspecified: Secondary | ICD-10-CM | POA: Insufficient documentation

## 2017-07-16 DIAGNOSIS — F419 Anxiety disorder, unspecified: Secondary | ICD-10-CM | POA: Diagnosis not present

## 2017-07-16 DIAGNOSIS — K7030 Alcoholic cirrhosis of liver without ascites: Secondary | ICD-10-CM | POA: Diagnosis not present

## 2017-07-16 HISTORY — PX: IR ANGIO INTRA EXTRACRAN SEL COM CAROTID INNOMINATE BILAT MOD SED: IMG5360

## 2017-07-16 HISTORY — PX: IR ANGIO VERTEBRAL SEL SUBCLAVIAN INNOMINATE BILAT MOD SED: IMG5366

## 2017-07-16 LAB — BASIC METABOLIC PANEL
Anion gap: 8 (ref 5–15)
BUN: 11 mg/dL (ref 6–20)
CALCIUM: 9.1 mg/dL (ref 8.9–10.3)
CO2: 26 mmol/L (ref 22–32)
CREATININE: 0.71 mg/dL (ref 0.61–1.24)
Chloride: 103 mmol/L (ref 101–111)
GFR calc Af Amer: >60 mL/min (ref >60)
Glucose, Bld: 100 mg/dL — ABNORMAL HIGH (ref 65–99)
Potassium: 4.8 mmol/L (ref 3.5–5.1)
Sodium: 137 mmol/L (ref 135–145)

## 2017-07-16 LAB — CBC
HCT: 40.4 % (ref 39.0–52.0)
Hemoglobin: 13.8 g/dL (ref 13.0–17.0)
MCH: 30.9 pg (ref 26.0–34.0)
MCHC: 34.2 g/dL (ref 30.0–36.0)
MCV: 90.4 fL (ref 78.0–100.0)
PLATELETS: 346 10*3/uL (ref 150–400)
RBC: 4.47 MIL/uL (ref 4.22–5.81)
RDW: 13.3 % (ref 11.5–15.5)
WBC: 6.8 10*3/uL (ref 4.0–10.5)

## 2017-07-16 LAB — PROTIME-INR
INR: 1.04
PROTHROMBIN TIME: 13.6 s (ref 11.4–15.2)

## 2017-07-16 LAB — APTT: APTT: 28 s (ref 24–36)

## 2017-07-16 MED ORDER — FENTANYL CITRATE (PF) 100 MCG/2ML IJ SOLN
INTRAMUSCULAR | Status: AC | PRN
  Administered 2017-07-16: 25 ug via INTRAVENOUS

## 2017-07-16 MED ORDER — SODIUM CHLORIDE 0.9 % IV SOLN: INTRAVENOUS | Status: AC

## 2017-07-16 MED ORDER — SODIUM CHLORIDE 0.9 % IV SOLN
Freq: Once | INTRAVENOUS | Status: AC
  Administered 2017-07-16: 08:00:00 via INTRAVENOUS

## 2017-07-16 MED ORDER — LIDOCAINE HCL 1 % IJ SOLN
INTRAMUSCULAR | Status: AC | PRN
  Administered 2017-07-16: 10 mL

## 2017-07-16 MED ORDER — LIDOCAINE HCL (PF) 1 % IJ SOLN
INTRAMUSCULAR | Status: AC
  Filled 2017-07-16: qty 30

## 2017-07-16 MED ORDER — MIDAZOLAM HCL 2 MG/2ML IJ SOLN
INTRAMUSCULAR | Status: AC
  Filled 2017-07-16: qty 2

## 2017-07-16 MED ORDER — MIDAZOLAM HCL 2 MG/2ML IJ SOLN
INTRAMUSCULAR | Status: AC | PRN
  Administered 2017-07-16: 1 mg via INTRAVENOUS

## 2017-07-16 MED ORDER — HEPARIN SODIUM (PORCINE) 1000 UNIT/ML IJ SOLN
INTRAMUSCULAR | Status: AC
  Filled 2017-07-16: qty 2

## 2017-07-16 MED ORDER — HEPARIN SODIUM (PORCINE) 1000 UNIT/ML IJ SOLN
INTRAMUSCULAR | Status: AC | PRN
  Administered 2017-07-16: 1000 [IU] via INTRAVENOUS
  Administered 2017-07-16: 500 [IU] via INTRAVENOUS

## 2017-07-16 MED ORDER — FENTANYL CITRATE (PF) 100 MCG/2ML IJ SOLN
INTRAMUSCULAR | Status: AC
  Filled 2017-07-16: qty 2

## 2017-07-16 MED ORDER — IOPAMIDOL (ISOVUE-300) INJECTION 61%
INTRAVENOUS | Status: AC
  Administered 2017-07-16: 80 mL
  Filled 2017-07-16: qty 150

## 2017-07-16 NOTE — Discharge Instructions (Addendum)
Cerebral Angiogram, Care After °Refer to this sheet in the next few weeks. These instructions provide you with information on caring for yourself after your procedure. Your health care provider may also give you more specific instructions. Your treatment has been planned according to current medical practices, but problems sometimes occur. Call your health care provider if you have any problems or questions after your procedure. °What can I expect after the procedure? °After your procedure, it is typical to have the following: °· Bruising at the catheter insertion site that usually fades within 1-2 weeks. °· Blood collecting in the tissue (hematoma) that may be painful to the touch. It should usually decrease in size and tenderness within 1-2 weeks. °· A mild headache. ° °Follow these instructions at home: °· Take medicines only as directed by your health care provider. °· You may shower 24-48 hours after the procedure or as directed by your health care provider. Remove the bandage (dressing) and gently wash the site with plain soap and water. Pat the area dry with a clean towel. Do not rub the site, because this may cause bleeding. °· Do not take baths, swim, or use a hot tub until your health care provider approves. °· Check your insertion site every day for redness, swelling, or drainage. °· Do not apply powder or lotion to the site. °· Do not lift over 10 lb (4.5 kg) for 5 days after your procedure or as directed by your health care provider. °· Ask your health care provider when it is okay to: °? Return to work or school. °? Resume usual physical activities or sports. °? Resume sexual activity. °· Do not drive home if you are discharged the same day as the procedure. Have someone else drive you. °· You may drive 24 hours after the procedure unless otherwise instructed by your health care provider. °· Do not operate machinery or power tools for 24 hours after the procedure or as directed by your health care  provider. °· If your procedure was done as an outpatient procedure, which means that you went home the same day as your procedure, a responsible adult should be with you for the first 24 hours after you arrive home. °· Keep all follow-up visits as directed by your health care provider. This is important. °Contact a health care provider if: °· You have a fever. °· You have chills. °· You have increased bleeding from the catheter insertion site. Hold pressure on the site. °Get help right away if: °· You have vision changes or loss of vision. °· You have numbness or weakness on one side of your body. °· You have difficulty talking, or you have slurred speech or cannot speak (aphasia). °· You feel confused or have difficulty remembering. °· You have unusual pain at the catheter insertion site. °· You have redness, warmth, or swelling at the catheter insertion site. °· You have drainage (other than a small amount of blood on the dressing) from the catheter insertion site. °· The catheter insertion site is bleeding, and the bleeding does not stop after 30 minutes of holding steady pressure on the site. °These symptoms may represent a serious problem that is an emergency. Do not wait to see if the symptoms will go away. Get medical help right away. Call your local emergency services (911 in [place] Do not drive yourself to the hospital. °This information is not intended to replace advice given to you by your health care provider. Make sure you discuss any questions   you have with your health care provider. °Document Released: 03/29/2014 Document Revised: 04/19/2016 Document Reviewed: 11/25/2013 °Elsevier Interactive Patient Education © 2017 [employer] °Moderate Conscious Sedation, Adult, Care After °These instructions provide you with information about caring for yourself after your procedure. Your health care provider may also give you more specific instructions. Your treatment has been planned according to current  medical practices, but problems sometimes occur. Call your health care provider if you have any problems or questions after your procedure. °What can I expect after the procedure? °After your procedure, it is common: °· To feel sleepy for several hours. °· To feel clumsy and have poor balance for several hours. °· To have poor judgment for several hours. °· To vomit if you eat too soon. ° °Follow these instructions at home: °For at least 24 hours after the procedure: ° °· Do not: °? Participate in activities where you could fall or become injured. °? Drive. °? Use heavy machinery. °? Drink alcohol. °? Take sleeping pills or medicines that cause drowsiness. °? Make important decisions or sign legal documents. °? Take care of children on your own. °· Rest. °Eating and drinking °· Follow the diet recommended by your health care provider. °· If you vomit: °? Drink water, juice, or soup when you can drink without vomiting. °? Make sure you have little or no nausea before eating solid foods. °General instructions °· Have a responsible adult stay with you until you are awake and alert. °· Take over-the-counter and prescription medicines only as told by your health care provider. °· If you smoke, do not smoke without supervision. °· Keep all follow-up visits as told by your health care provider. This is important. °Contact a health care provider if: °· You keep feeling nauseous or you keep vomiting. °· You feel light-headed. °· You develop a rash. °· You have a fever. °Get help right away if: °· You have trouble breathing. °This information is not intended to replace advice given to you by your health care provider. Make sure you discuss any questions you have with your health care provider. °Document Released: 09/02/2013 Document Revised: 04/16/2016 Document Reviewed: 03/03/2016 °Elsevier Interactive Patient Education © 2018 Elsevier Inc. ° °

## 2017-07-16 NOTE — Procedures (Signed)
S/P 4 vessel cerebral arteriogram  Lt CFA approach. Findings. 1.Approx 85 % stenosis RT ICA supraclinoid seg. 2.70% stenosis RT  ICA cavernous seg. 3.Approx 55% stenosis Lt ICA distal cav seg. 4.Approx 85 to 90 % stenosis of dominant Lt VA at origin. 5.Occluded RT VA at origin

## 2017-07-16 NOTE — Sedation Documentation (Signed)
ETC02 removed per Dr. Deveshwar  

## 2017-07-16 NOTE — Sedation Documentation (Signed)
Sheath to right groin removed. V-Pad applied 

## 2017-07-16 NOTE — H&P (Signed)
Chief Complaint: R ICA stenosis of stent  Supervising Physician: Luanne Bras  Patient Status: [facility] - Out-pt  HPI: Roy [name] is a 71 y.o. male well-known to Dr. 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  He continues to smoke.  He has had multiple repeat angiograms over time to follow this stent. He had a recent CTA that showed 70% stenosis.  He presents today for repeat angio to determine if he may need further intervention.  He is taking his plavix.  Past Medical History:  Past Medical History:  Diagnosis Date  . AICD (automatic cardioverter/defibrillator) present 08/19/2015   [employer] Jude BiV ICD for primary prevention by Dr. Lovena Le  . Alcohol abuse    6 beers per day; hospital admission in 2009 for withdrawal symptoms  . Alcoholic cirrhosis ([place])   . Anxiety and depression   . Cerebrovascular disease 2009   TIA; 2009- right ICA stent; re-intervention for restenosis complicated by [facility] w/o sx  . CHF (congestive heart failure) ([place]) 06-02-14  . Chronic obstructive pulmonary disease ([place])   . Degenerative joint disease    Total shoulder arthroplasty-right  . Hyperlipidemia   . Hypertension   . LBBB (left bundle branch block)    Normal echo-2011; stress nuclear in 09/2010--septal hypoperfusion representing nontransmural infarction or the effect of left bundle branch block, no ischemia  . Myocardial infarction [facility]) June 02, 2014   Massive Heart Attack  . Peripheral vascular disease ([place])   . 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  . Thrombocytopenia ([place])   . Tobacco abuse    -100 pack years; 1.5 packs per day  . Tubular adenoma of colon     Past Surgical History:  Past Surgical History:  Procedure Laterality Date  . ABDOMINAL AORTAGRAM [name] 05/04/2014   Procedure: ABDOMINAL Maxcine Ham;  Surgeon: [name], MD;  Location: [facility] CATH LAB;  Service: Cardiovascular;  Laterality: [name];  . ABDOMINAL AORTOGRAM [name] 06/25/2017   Procedure:  Abdominal Aortogram;  Surgeon: [name], MD;  Location: [place] CV LAB;  Service: Cardiovascular;  Laterality: [name];  . AGILE CAPSULE [name] 03/18/2014   Procedure: AGILE CAPSULE;  Surgeon: Danie Binder, MD;  Location: [facility] ENDO SU[facility];  Service: Endoscopy;  Laterality: [name];  7:30  . BACK SURGERY    . BACTERIAL OVERGROWTH TEST [name] 05/24/2015   Procedure: BACTERIAL OVERGROWTH TEST;  Surgeon: Danie Binder, MD;  Location: [facility] ENDO SU[facility];  Service: Endoscopy;  Laterality: [name];  0700  . BI-VENTRICULAR IMPLANTABLE CARDIOVERTER DEFIBRILLATOR  (CRT-D)  08/19/2015  . CARPAL TUNNEL RELEASE Left 02/02/2016   Procedure: LEFT CARPAL TUNNEL RELEASE;  Surgeon: Daryll Brod, MD;  Location: [place];  Service: Orthopedics;  Laterality: Left;  ANESTHESIA: IV REGIONAL UPPER ARM  . CATARACT EXTRACTION W/PHACO Right 03/08/2015   Procedure: CATARACT EXTRACTION PHACO AND INTRAOCULAR LENS PLACEMENT (IOC);  Surgeon: Rutherford Guys, MD;  Location: [facility] ORS;  Service: Ophthalmology;  Laterality: Right;  CDE:9.46  . CATARACT EXTRACTION W/PHACO Left 03/22/2015   Procedure: CATARACT EXTRACTION PHACO AND INTRAOCULAR LENS PLACEMENT (IOC);  Surgeon: Rutherford Guys, MD;  Location: [facility] ORS;  Service: Ophthalmology;  Laterality: Left;  CDE:5.80  . COLONOSCOPY  08/22/09   Fields-(Tubular Adenoma)3-mm transverse polyp/4-mm polyp otherwise noraml/small internal hemorrhoids  . COLONOSCOPY [name] 04/14/2014   EQA:STMHD internal hemorrhids/normal mocsa in the terminal iluem/left colonis redundant  . COLONOSCOPY W/ POLYPECTOMY  2011  . EP IMPLANTABLE DEVICE [name] 08/19/2015   Procedure: BiV ICD  Insertion CRT-D;  Surgeon: Evans Lance, MD;  Location: [place] CV LAB;  Service: Cardiovascular;  Laterality: [name];  . ESOPHAGOGASTRODUODENOSCOPY [name] 03/05/2014   SLF: 1. Stricture at the gastroesophagael junction 2. Small hiatal hernia 3. Moderate non-erosive gastritis and duodentitis. 4. No surce for Melena identified.   [name] GIVENS CAPSULE  STUDY [name] 03/30/2014   Procedure: GIVENS CAPSULE STUDY;  Surgeon: Danie Binder, MD;  [facility];  Service: Endoscopy;  Laterality: [name];  7:30  . IR RADIOLOGIST EVAL & MGMT  04/01/2017  . JOINT REPLACEMENT Right   . LOWER EXTREMITY ANGIOGRAPHY Bilateral 06/25/2017   Procedure: Lower Extremity Angiography;  Surgeon: [name], MD;  Location: [place] CV LAB;  Service: Cardiovascular;  Laterality: Bilateral;  . LUMBAR FUSION  2010  . TOTAL SHOULDER ARTHROPLASTY Right 2011   Dr. Tamera Punt  . ULNAR NERVE TRANSPOSITION Left 02/02/2016   Procedure: LEFT IN-SITU DECOMPRESSION ULNAR NERVE ;  Surgeon: Daryll Brod, MD;  Location: [place];  Service: Orthopedics;  Laterality: Left;  . ULNAR TUNNEL RELEASE Left 02/02/2016   Procedure: LEFT CUBITAL TUNNEL RELEASE;  Surgeon: Daryll Brod, MD;  Location: [place];  Service: Orthopedics;  Laterality: Left;  [name] VASECTOMY  1971    Family History:  Family History  Problem Relation Age of Onset  . Coronary artery disease Mother   . Diabetes Mother   . Heart disease Mother        Before age 64 - 11 Bypasses  . Hypertension Mother   . Heart attack Mother        3-4 Heart attacks  . Alzheimer's disease Father   . Diabetes Father   . Heart attack Sister   . Cancer Brother        Prostate  . Hyperlipidemia Son   . Prostate cancer Brother   . Alzheimer's disease Sister   . Colon cancer Neg Hx     Social History:  reports that he has been smoking E-cigarettes and Cigarettes.  He started smoking about 59 years ago. He has a 60.00 pack-year smoking history. He has never used smokeless tobacco. He reports that he does not drink alcohol or use drugs.  Allergies: No Known Allergies  Medications: Medications reviewed in epic  Please HPI for pertinent positives, otherwise complete 10 system ROS negative.  Mallampati Score: MD Evaluation Airway: WNL Heart: Other (comments) Heart  comments: pacemaker  (AICD) Abdomen: WNL Chest/ Lungs: WNL ASA  Classification: 3 Mallampati/Airway Score: One  Physical Exam: BP 139/70 (BP Location: Right Arm)   Pulse 71   Temp 97.7 F (36.5 C) (Oral)   Ht 5' 3"  (1.6 m)   Wt 154 lb (69.9 kg)   SpO2 98%   BMI 27.28 kg/m  Body mass index is 27.28 kg/m. General: pleasant, WD, WN white male who is laying in bed in NAD HEENT: head is normocephalic, atraumatic.  Sclera are noninjected.  PERRL.  Ears and nose without any masses or lesions.  Mouth is pink and moist Heart: regular, rate, and rhythm. AICD in LUC  Normal s1,s2. No obvious murmurs, gallops, or rubs noted.  Palpable radial pulses bilaterally Lungs: CTAB, no wheezes, rhonchi, or rales noted.  Respiratory effort nonlabored Abd: soft, NT, ND, +BS, no masses, hernias, or organomegaly Psych: A&Ox3 with an appropriate affect.   Labs: Results for orders placed or performed during the hospital encounter of 07/16/17 (from the past 48 hour(s))  APTT     Status: None  Collection Time: 07/16/17  8:07 AM  Result Value Ref Range   aPTT 28 24 - 36 seconds  Basic metabolic panel     Status: Abnormal   Collection Time: 07/16/17  8:07 AM  Result Value Ref Range   Sodium 137 135 - 145 mmol/L   Potassium 4.8 3.5 - 5.1 mmol/L   Chloride 103 101 - 111 mmol/L   CO2 26 22 - 32 mmol/L   Glucose, Bld 100 (H) 65 - 99 mg/dL   BUN 11 6 - 20 mg/dL   Creatinine, Ser 0.71 0.61 - 1.24 mg/dL   Calcium 9.1 8.9 - 10.3 mg/dL   GFR calc non Af Amer >60 >60 mL/min   GFR calc Af Amer >60 >60 mL/min    Comment: (NOTE) The eGFR has been calculated using the CKD EPI equation. This calculation has not been validated in all clinical situations. eGFR's persistently <60 mL/min signify possible Chronic Kidney Disease.    Anion gap 8 5 - 15  CBC     Status: None   Collection Time: 07/16/17  8:07 AM  Result Value Ref Range   WBC 6.8 4.0 - 10.5 K/uL   RBC 4.47 4.22 - 5.81 MIL/uL   Hemoglobin 13.8 13.0 - 17.0 g/dL   HCT  40.4 39.0 - 52.0 %   MCV 90.4 78.0 - 100.0 fL   MCH 30.9 26.0 - 34.0 pg   MCHC 34.2 30.0 - 36.0 g/dL   RDW 13.3 11.5 - 15.5 %   Platelets 346 150 - 400 K/uL  Protime-INR     Status: None   Collection Time: 07/16/17  8:07 AM  Result Value Ref Range   Prothrombin Time 13.6 11.4 - 15.2 seconds   INR 1.04     Imaging: No results found.  Assessment/Plan 1. R ICA stenosis of stent  We will plan to proceed today with a diagnostic cerebral angiogram.  Labs and vitals reviewed.  Risks and benefits discussed with the patient including, but not limited to bleeding, infection, vascular injury or contrast induced renal failure. All of the patient's questions were answered, patient is agreeable to proceed. Consent signed and in chart.   Thank you for this interesting consult.  I greatly enjoyed meeting Roy [name] and look forward to participating in their care.  A copy of this report was sent to the requesting provider on this date.  Electronically Signed: Henreitta Cea 07/16/2017, 9:30 AM   I spent a total of    25 Minutes in face to face in clinical consultation, greater than 50% of which was counseling/coordinating care for R ICA stenosis of stent

## 2017-07-18 ENCOUNTER — Other Ambulatory Visit (HOSPITAL_COMMUNITY): Payer: Self-pay | Admitting: Interventional Radiology

## 2017-07-18 ENCOUNTER — Encounter (HOSPITAL_COMMUNITY): Payer: Self-pay | Admitting: Interventional Radiology

## 2017-07-18 DIAGNOSIS — I771 Stricture of artery: Secondary | ICD-10-CM

## 2017-07-22 ENCOUNTER — Ambulatory Visit (INDEPENDENT_AMBULATORY_CARE_PROVIDER_SITE_OTHER): Payer: Medicare Other | Admitting: Surgery

## 2017-07-22 ENCOUNTER — Encounter: Payer: Self-pay | Admitting: Surgery

## 2017-07-22 ENCOUNTER — Ambulatory Visit (HOSPITAL_COMMUNITY)
Admission: RE | Admit: 2017-07-22 | Discharge: 2017-07-22 | Disposition: A | Discharge status: Home / Self Care | Payer: Medicare Other | Source: Ambulatory Visit | Attending: Surgery | Admitting: Surgery

## 2017-07-22 VITALS — BP 117/73 | HR 64 | Ht 63.0 in | Wt 154.3 lb

## 2017-07-22 DIAGNOSIS — I255 Ischemic cardiomyopathy: Secondary | ICD-10-CM

## 2017-07-22 DIAGNOSIS — Z01810 Encounter for preprocedural cardiovascular examination: Secondary | ICD-10-CM

## 2017-07-22 DIAGNOSIS — Z01812 Encounter for preprocedural laboratory examination: Secondary | ICD-10-CM | POA: Diagnosis not present

## 2017-07-22 DIAGNOSIS — I739 Peripheral vascular disease, unspecified: Secondary | ICD-10-CM

## 2017-07-22 DIAGNOSIS — I70213 Atherosclerosis of native arteries of extremities with intermittent claudication, bilateral legs: Secondary | ICD-10-CM

## 2017-07-22 NOTE — Progress Notes (Signed)
[facility] [place]  Patient name: Roy [name] MRN: [id number] DOB: 09/01/1946 Sex: male   REASON FOR VISIT:    Follow up  HISOTRY OF PRESENT ILLNESS:    This is a 71 year old gentleman who returns today for follow-up of his claudication symptoms.  He is status post bilateral iliac stenting in 2015.  This was done for lifestyle limiting claudication.  An 8 x 40 self extending stent was placed on the left and 8 x 80 on the right.  He had moderate left superficial femoral artery stenosis within the adductor canal and right common femoral artery stenosis.  The patient had been doing well up until about 3 months ago.  At that time he developed claudication symptoms at 100 feet that were alleviated with rest.  He initially has pain in his calf but then this progresses to his hips.  The patient is unable to tolerate his level of disability once to proceed with revascularization.  His left leg bothers him more than the right.  He does not have any open wounds currently.  The patient continues to smoke.  He is on aspirin and Plavix for a intracranial stent.  He is medically managed for hypercholesterolemia with a statin.  His blood pressure is medically managed.   PAST MEDICAL HISTORY:   Past Medical History:  Diagnosis Date  . AICD (automatic cardioverter/defibrillator) present 08/19/2015   St Jude BiV ICD for primary prevention by Dr. Lovena Le  . Alcohol abuse    6 beers per day; hospital admission in 2009 for withdrawal symptoms  . Alcoholic cirrhosis ([place])   . Anxiety and depression   . Cerebrovascular disease 2009   TIA; 2009- right ICA stent; re-intervention for restenosis complicated by [facility] w/o sx  . CHF (congestive heart failure) ([place]) 06-02-14  . Chronic obstructive pulmonary disease ([place])   . Degenerative joint disease    Total shoulder arthroplasty-right  . Hyperlipidemia   . Hypertension   . LBBB (left bundle branch  block)    Normal echo-2011; stress nuclear in 09/2010--septal hypoperfusion representing nontransmural infarction or the effect of left bundle branch block, no ischemia  . Myocardial infarction [facility]) June 02, 2014   Massive Heart Attack  . Peripheral vascular disease ([place])   . 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  . Thrombocytopenia ([place])   . Tobacco abuse    -100 pack years; 1.5 packs per day  . Tubular adenoma of colon      FAMILY HISTORY:   Family History  Problem Relation Age of Onset  . Coronary artery disease Mother   . Diabetes Mother   . Heart disease Mother        Before age 15 - 89 Bypasses  . Hypertension Mother   . Heart attack Mother        3-4 Heart attacks  . Alzheimer's disease Father   . Diabetes Father   . Heart attack Sister   . Cancer Brother        Prostate  . Hyperlipidemia Son   . Prostate cancer Brother   . Alzheimer's disease Sister   . Colon cancer Neg Hx     SOCIAL HISTORY:   Social History  Substance Use Topics  . Smoking status: Current Every Day Smoker    Packs/day: 1.00    Years: 60.00    Types: E-cigarettes, Cigarettes    Start date: 08/20/1957  . Smokeless tobacco: Never Used  . Alcohol use No  Comment: quit in July 2015; No ETOH to date 02/14/15     ALLERGIES:   No Known Allergies   CURRENT MEDICATIONS:   Current Outpatient Prescriptions  Medication Sig Dispense Refill  . acetaminophen (TYLENOL) 500 MG tablet Take 500 mg by mouth daily as needed for moderate pain.    [name] aspirin EC 81 MG tablet Take 81 mg by mouth every evening.     [name] atorvastatin (LIPITOR) 20 MG tablet Take 20 mg by mouth daily.     . carvedilol (COREG) 3.125 MG tablet Take 1 tablet (3.125 mg total) by mouth 2 (two) times daily. 180 tablet 3  . clopidogrel (PLAVIX) 75 MG tablet Take 75 mg by mouth every evening.     [name] ENTRESTO 49-51 MG TAKE 1 TABLET TWICE A DAY 180 tablet 3  . Flax Oil-Fish Oil-Borage Oil (FISH OIL-FLAX OIL-BORAGE OIL) CAPS  Take 1 capsule by mouth daily.    . fluticasone (FLONASE) 50 MCG/ACT nasal spray Place 1 spray into both nostrils daily as needed for allergies or rhinitis.    . folic acid (FOLVITE) 878 MCG tablet Take 800 mcg by mouth daily.    . furosemide (LASIX) 20 MG tablet Take 20 mg by mouth daily.     [name] gabapentin (NEURONTIN) 300 MG capsule Take 300 mg by mouth 2 (two) times daily.     . Iron-FA-B Cmp-C-Biot-Probiotic (FUSION PLUS) CAPS Take 1 capsule by mouth daily.     . Multiple Vitamins-Minerals (CENTRUM SILVER PO) Take 1 tablet by mouth daily.     . pantoprazole (PROTONIX) 40 MG tablet TAKE 1 TABLET DAILY 90 tablet 3  . PARoxetine (PAXIL) 20 MG tablet Take 20 mg by mouth daily.    . potassium chloride SA (K-DUR,KLOR-CON) 20 MEQ tablet Take 20 mEq by mouth daily.    . SUPER B COMPLEX/C PO Take 1 tablet by mouth daily.     No current facility-administered medications for this visit.     REVIEW OF SYSTEMS:   [X]  denotes positive finding, [ ]  denotes negative finding Cardiac  Comments:  Chest pain or chest pressure:    Shortness of breath upon exertion:    Short of breath when lying flat:    Irregular heart rhythm:        Vascular    Pain in calf, thigh, or hip brought on by ambulation:    Pain in feet at night that wakes you up from your sleep:     Blood clot in your veins:    Leg swelling:         Pulmonary    Oxygen at home:    Productive cough:     Wheezing:         Neurologic    Sudden weakness in arms or legs:     Sudden numbness in arms or legs:     Sudden onset of difficulty speaking or slurred speech:    Temporary loss of vision in one eye:     Problems with dizziness:         Gastrointestinal    Blood in stool:     Vomited blood:         Genitourinary    Burning when urinating:     Blood in urine:        Psychiatric    Major depression:         Hematologic    Bleeding problems:    Problems with blood clotting too easily:  Skin    Rashes or ulcers:          Constitutional    Fever or chills:      PHYSICAL EXAM:   Vitals:   07/22/17 1459  BP: 117/73  Pulse: 64  SpO2: 97%  Weight: 154 lb 4.8 oz (70 kg)  Height: 5\' 3"  (1.6 m)    GENERAL: The patient is a well-nourished male, in no acute distress. The vital signs are documented above. CARDIAC: There is a regular rate and rhythm.  VASCULAR: non palpable pedal pulse on the left PULMONARY: Non-labored respirations MUSCULOSKELETAL: There are no major deformities or cyanosis. NEUROLOGIC: No focal weakness or paresthesias are detected. SKIN: There are no ulcers or rashes noted. PSYCHIATRIC: The patient has a normal affect.  STUDIES:   I have reviewed his vein mapping.  The left saphenous vein is over 0.4 cm throughout the left leg from the groin to the knee  MEDICAL ISSUES:   Left leg claudication: We discussed proceeding with left femoral to above-knee popliteal artery bypass graft with vein as well as left profunda femoral endarterectomy.  I discussed the risks and benefits of the operation including the hospital stay the risk of infection the risk of bypass graft failure.  I stressed the importance of smoking cessation for prolonged graft patency.  I will have him cleared by cardiology as well as neuroradiology prior to his operation.  I will need to stop his Plavix 1 week prior to his operation.  This is been scheduled for Thursday, September 13    Annamarie Major, MD [facility] [phone] Pager [phone]

## 2017-07-25 ENCOUNTER — Encounter: Payer: Self-pay | Admitting: Cardiovascular Disease

## 2017-07-25 ENCOUNTER — Ambulatory Visit (INDEPENDENT_AMBULATORY_CARE_PROVIDER_SITE_OTHER): Payer: Medicare Other | Admitting: Cardiovascular Disease

## 2017-07-25 ENCOUNTER — Telehealth: Payer: Self-pay | Admitting: Cardiovascular Disease

## 2017-07-25 VITALS — BP 148/60 | HR 68 | Ht 63.0 in | Wt 155.0 lb

## 2017-07-25 DIAGNOSIS — Z950 Presence of cardiac pacemaker: Secondary | ICD-10-CM

## 2017-07-25 DIAGNOSIS — I255 Ischemic cardiomyopathy: Secondary | ICD-10-CM

## 2017-07-25 DIAGNOSIS — I252 Old myocardial infarction: Secondary | ICD-10-CM

## 2017-07-25 DIAGNOSIS — Z01818 Encounter for other preprocedural examination: Secondary | ICD-10-CM

## 2017-07-25 DIAGNOSIS — I10 Essential (primary) hypertension: Secondary | ICD-10-CM | POA: Diagnosis not present

## 2017-07-25 DIAGNOSIS — I739 Peripheral vascular disease, unspecified: Secondary | ICD-10-CM | POA: Diagnosis not present

## 2017-07-25 DIAGNOSIS — Z720 Tobacco use: Secondary | ICD-10-CM | POA: Diagnosis not present

## 2017-07-25 DIAGNOSIS — I5022 Chronic systolic (congestive) heart failure: Secondary | ICD-10-CM

## 2017-07-25 NOTE — Patient Instructions (Signed)
Medication Instructions:  Continue all current medications.  Labwork: none  Testing/Procedures:  Your physician has requested that you have an echocardiogram. Echocardiography is a painless test that uses sound waves to create images of your heart. It provides your doctor with information about the size and shape of your heart and how well your heart's chambers and valves are working. This procedure takes approximately one hour. There are no restrictions for this procedure.  Office will contact with results via phone or letter.    Follow-Up: Your physician wants you to follow up in:  1 year.  You will receive a reminder letter in the mail one-two months in advance.  If you don't receive a letter, please call our office to schedule the follow up appointment   Any Other Special Instructions Will Be Listed Below (If Applicable).  If you need a refill on your cardiac medications before your next appointment, please call your pharmacy.  

## 2017-07-25 NOTE — Telephone Encounter (Signed)
Pre-cert Verification for the following procedure   Echo scheduled for 08-02-17  At [facility].

## 2017-07-25 NOTE — Progress Notes (Signed)
SUBJECTIVE: The patient presents for preoperative risk stratification for left femoral to above-the-knee popliteal artery bypass graft surgery as well as left profunda femoris endarterectomy.  This has been scheduled for September 13.  He was hospitalized in 2015 for pneumonia with hypoxemic respiratory failure and found to have a non-STEMI. He has a history of tobacco and alcohol abuse, COPD, cirrhosis, cerebrovascular disease with intracerebral stenting, chronic systolic heart failure with an ejection fraction of 35% (07/14/2015 echo) s/p BiV ICD (followed by Dr. Lovena Le), chronic kidney disease and peripheral arterial disease. His NSTEMI was medically managed. Troponins peaked at 4.84.   Nuclear stress testing on 08/30/2014 demonstrated a large area of inferior myocardial scar with no evidence of ischemia, and inferior akinesis.  The patient denies any symptoms of chest pain, palpitations, shortness of breath, lightheadedness, dizziness, leg swelling, orthopnea, PND, and syncope.  His primary complaints relate to bilateral leg claudication.  BiV ICD device interrogation was normal on 07/05/17.   Review of Systems: As per "subjective", otherwise negative.  No Known Allergies  Current Outpatient Prescriptions  Medication Sig Dispense Refill  . acetaminophen (TYLENOL) 500 MG tablet Take 500 mg by mouth daily as needed for moderate pain.    [name] aspirin EC 81 MG tablet Take 81 mg by mouth every evening.     [name] atorvastatin (LIPITOR) 20 MG tablet Take 20 mg by mouth daily.     . carvedilol (COREG) 3.125 MG tablet Take 1 tablet (3.125 mg total) by mouth 2 (two) times daily. 180 tablet 3  . clopidogrel (PLAVIX) 75 MG tablet Take 75 mg by mouth every evening.     [name] ENTRESTO 49-51 MG TAKE 1 TABLET TWICE A DAY 180 tablet 3  . Flax Oil-Fish Oil-Borage Oil (FISH OIL-FLAX OIL-BORAGE OIL) CAPS Take 1 capsule by mouth daily.    . fluticasone (FLONASE) 50 MCG/ACT nasal spray Place 1 spray into  both nostrils daily as needed for allergies or rhinitis.    . folic acid (FOLVITE) 366 MCG tablet Take 800 mcg by mouth daily.    . furosemide (LASIX) 20 MG tablet Take 20 mg by mouth daily.     [name] gabapentin (NEURONTIN) 300 MG capsule Take 300 mg by mouth 2 (two) times daily.     . Iron-FA-B Cmp-C-Biot-Probiotic (FUSION PLUS) CAPS Take 1 capsule by mouth daily.     . Multiple Vitamins-Minerals (CENTRUM SILVER PO) Take 1 tablet by mouth daily.     . pantoprazole (PROTONIX) 40 MG tablet TAKE 1 TABLET DAILY 90 tablet 3  . PARoxetine (PAXIL) 20 MG tablet Take 20 mg by mouth daily.    . potassium chloride SA (K-DUR,KLOR-CON) 20 MEQ tablet Take 20 mEq by mouth daily.    . SUPER B COMPLEX/C PO Take 1 tablet by mouth daily.     No current facility-administered medications for this visit.     Past Medical History:  Diagnosis Date  . AICD (automatic cardioverter/defibrillator) present 08/19/2015   St Jude BiV ICD for primary prevention by Dr. Lovena Le  . Alcohol abuse    6 beers per day; hospital admission in 2009 for withdrawal symptoms  . Alcoholic cirrhosis ([place])   . Anxiety and depression   . Cerebrovascular disease 2009   TIA; 2009- right ICA stent; re-intervention for restenosis complicated by [facility] w/o sx  . CHF (congestive heart failure) ([place]) 06-02-14  . Chronic obstructive pulmonary disease ([place])   . Degenerative joint disease    Total shoulder arthroplasty-right  .  Hyperlipidemia   . Hypertension   . LBBB (left bundle branch block)    Normal echo-2011; stress nuclear in 09/2010--septal hypoperfusion representing nontransmural infarction or the effect of left bundle branch block, no ischemia  . Myocardial infarction [facility]) June 02, 2014   Massive Heart Attack  . Peripheral vascular disease ([place])   . 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  . Thrombocytopenia ([place])   . Tobacco abuse    -100 pack years; 1.5 packs per day  . Tubular adenoma of colon     Past Surgical History:    Procedure Laterality Date  . ABDOMINAL AORTAGRAM [name] 05/04/2014   Procedure: ABDOMINAL Maxcine Ham;  Surgeon: [name], MD;  Location: [facility] CATH LAB;  Service: Cardiovascular;  Laterality: [name];  . ABDOMINAL AORTOGRAM [name] 06/25/2017   Procedure: Abdominal Aortogram;  Surgeon: [name], MD;  Location: [place] CV LAB;  Service: Cardiovascular;  Laterality: [name];  . AGILE CAPSULE [name] 03/18/2014   Procedure: AGILE CAPSULE;  Surgeon: Danie Binder, MD;  [facility] ENDO [facility];  Service: Endoscopy;  Laterality: [name];  7:30  . BACK SURGERY    . BACTERIAL OVERGROWTH TEST [name] 05/24/2015   Procedure: BACTERIAL OVERGROWTH TEST;  Surgeon: Danie Binder, MD;  [facility] END[facility];  Service: Endoscopy;  Laterality: [name];  0700  . BI-VENTRICULAR IMPLANTABLE CARDIOVERTER DEFIBRILLATOR  (CRT-D)  08/19/2015  . CARPAL TUNNEL RELEASE Left 02/02/2016   Procedure: LEFT CARPAL TUNNEL RELEASE;  Surgeon: Daryll Brod, MD;  Location: [place];  Service: Orthopedics;  Laterality: Left;  ANESTHESIA: IV REGIONAL UPPER ARM  . CATARACT EXTRACTION W/PHACO Right 03/08/2015   Procedure: CATARACT EXTRACTION PHACO AND INTRAOCULAR LENS PLACEMENT (IOC);  Surgeon: Rutherford Guys, MD;  Location: [facility] ORS;  Service: Ophthalmology;  Laterality: Right;  CDE:9.46  . CATARACT EXTRACTION W/PHACO Left 03/22/2015   Procedure: CATARACT EXTRACTION PHACO AND INTRAOCULAR LENS PLACEMENT (IOC);  Surgeon: Rutherford Guys, MD;  Location: [facility] ORS;  Service: Ophthalmology;  Laterality: Left;  CDE:5.80  . COLONOSCOPY  08/22/09   Fields-(Tubular Adenoma)3-mm transverse polyp/4-mm polyp otherwise noraml/small internal hemorrhoids  . COLONOSCOPY [name] 04/14/2014   CVE:LFYBO internal hemorrhids/normal mocsa in the terminal iluem/left colonis redundant  . COLONOSCOPY W/ POLYPECTOMY  2011  . EP IMPLANTABLE DEVICE [name] 08/19/2015   Procedure: BiV ICD Insertion CRT-D;  Surgeon: Evans Lance, MD;  Location: [place] CV LAB;  Service:  Cardiovascular;  Laterality: [name];  . ESOPHAGOGASTRODUODENOSCOPY [name] 03/05/2014   SLF: 1. Stricture at the gastroesophagael junction 2. Small hiatal hernia 3. Moderate non-erosive gastritis and duodentitis. 4. No surce for Melena identified.   [name] GIVENS CAPSULE STUDY [name] 03/30/2014   Procedure: GIVENS CAPSULE STUDY;  Surgeon: Danie Binder, MD;  [facility];  Service: Endoscopy;  Laterality: [name];  7:30  . IR ANGIO INTRA EXTRACRAN SEL COM CAROTID INNOMINATE BILAT MOD SED  07/16/2017  . IR ANGIO VERTEBRAL SEL SUBCLAVIAN INNOMINATE BILAT MOD SED  07/16/2017  . IR RADIOLOGIST EVAL & MGMT  04/01/2017  . JOINT REPLACEMENT Right   . LOWER EXTREMITY ANGIOGRAPHY Bilateral 06/25/2017   Procedure: Lower Extremity Angiography;  Surgeon: [name], MD;  Location: [place] CV LAB;  Service: Cardiovascular;  Laterality: Bilateral;  . LUMBAR FUSION  2010  . TOTAL SHOULDER ARTHROPLASTY Right 2011   Dr. Tamera Punt  . ULNAR NERVE TRANSPOSITION Left 02/02/2016   Procedure: LEFT IN-SITU DECOMPRESSION ULNAR NERVE ;  Surgeon: Daryll Brod, MD;  Location: [place];  Service:  Orthopedics;  Laterality: Left;  . ULNAR TUNNEL RELEASE Left 02/02/2016   Procedure: LEFT CUBITAL TUNNEL RELEASE;  Surgeon: Daryll Brod, MD;  Location: [place];  Service: Orthopedics;  Laterality: Left;  [name] VASECTOMY  1971    Social History   Social History  . Marital status: Married    Spouse name: [name]  . Number of children: 2  . Years of education: [name]   Occupational History  . 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  Retired   Social History Main Topics  . Smoking status: Current Every Day Smoker    Packs/day: 1.00    Years: 60.00    Types: E-cigarettes, Cigarettes    Start date: 08/20/1957  . Smokeless tobacco: Never Used  . Alcohol use No     Comment: quit in July 2015; No ETOH to date 02/14/15  . Drug use: No  . Sexual activity: No   Other Topics Concern  . Not on file   Social  History Narrative   Accompanied by daughter [name]   Lives w/ wife, daughter     Vitals:   07/25/17 1447  BP: (!) 148/60  Pulse: 68  SpO2: 98%  Weight: 155 lb (70.3 kg)  Height: 5\' 3"  (1.6 m)    Wt Readings from Last 3 Encounters:  07/25/17 155 lb (70.3 kg)  07/22/17 154 lb 4.8 oz (70 kg)  07/16/17 154 lb (69.9 kg)     PHYSICAL EXAM General: NAD HEENT: Normal. Neck: No JVD, no thyromegaly. Lungs: Clear to auscultation bilaterally with normal respiratory effort. CV: Nondisplaced PMI.  Regular rate and rhythm, normal S1/S2, no S3/S4, no murmur. No pretibial or periankle edema.     Abdomen: Soft, nontender, no distention.  Neurologic: Alert and oriented.  Psych: Normal affect. Skin: Normal. Musculoskeletal: No gross deformities.    ECG: Most recent ECG reviewed.   Labs: Lab Results  Component Value Date/Time   K 4.8 07/16/2017 08:07 AM   K 4.2 04/16/2012 08:07 AM   BUN 11 07/16/2017 08:07 AM   BUN 6 04/16/2012 08:07 AM   CREATININE 0.71 07/16/2017 08:07 AM   CREATININE 0.65 (L) 02/13/2017 08:59 AM   ALT 13 02/13/2017 08:59 AM   TSH 1.650 06/16/2014 12:42 AM   HGB 13.8 07/16/2017 08:07 AM     Lipids: Lab Results  Component Value Date/Time   [facility]  10/18/2010 07:27 PM    35        Total Cholesterol/HDL:CHD Risk Coronary Heart Disease Risk Table                     Men   Women  1/2 Average Risk   3.4   3.3  Average Risk       5.0   4.4  2 X Average Risk   9.6   7.1  3 X Average Risk  23.4   11.0        Use the calculated Patient Ratio above and the CHD Risk Table to determine the patient's CHD Risk.        ATP III CLASSIFICATION (LDL):  <100     mg/dL   Optimal  100-129  mg/dL   Near or Above                    Optimal  130-159  mg/dL   Borderline  160-189  mg/dL   High  >190     mg/dL   Very  High   CHOL 177 01/08/2012 08:09 AM   TRIG 77 06/08/2014 03:31 AM   HDL 53 10/18/2010 07:27 PM       ASSESSMENT AND PLAN:  1. CAD: Stable  ischemic heart disease. Nuclear MPI study results noted above. Continue ASA, Plavix, Coreg, and Lipitor.   2. Chronic systolic heart failure s/p BivICD: Euvolemic and stable on Lasix 20 mg. Continue carvedilol and Entresto. I will repeat an echocardiogram to evaluate for interval improvement in LVEF prior to surgery.  BiV ICD device interrogation was normal on 07/05/17.  3. Tobacco abuse: Continues to smoke 1 ppd. He says "I believe it's my time to quit".  4. PVD:  Scheduled to undergo left femoral to above-the-knee popliteal artery bypass graft surgery as well as left profunda femoris endarterectomy on 08/08/17.Continue ASA and statin.   5. Hyperlipidemia: Continue Lipitor 20 mg.   6. Essential HTN: Mildly elevated. Will monitor.  7. Preoperative risk stratification: I will repeat an echocardiogram to evaluate for interval improvement in LVEF prior to surgery. Given his multiple comorbidities and if left ventricular systolic function remains significantly reduced, he would be at high risk for major adverse cardiac events in the perioperative period. If possible, I would continue aspirin and Coreg to mitigate this risk.    Disposition: Follow up 1 yr.   Kate Sable, M.D., F.A.C.C.

## 2017-08-01 ENCOUNTER — Other Ambulatory Visit: Payer: Self-pay

## 2017-08-01 ENCOUNTER — Ambulatory Visit (INDEPENDENT_AMBULATORY_CARE_PROVIDER_SITE_OTHER): Payer: Medicare Other

## 2017-08-01 DIAGNOSIS — I255 Ischemic cardiomyopathy: Secondary | ICD-10-CM

## 2017-08-01 DIAGNOSIS — Z01818 Encounter for other preprocedural examination: Secondary | ICD-10-CM

## 2017-08-01 DIAGNOSIS — Z01810 Encounter for preprocedural cardiovascular examination: Secondary | ICD-10-CM

## 2017-08-02 ENCOUNTER — Telehealth: Payer: Self-pay | Admitting: *Deleted

## 2017-08-02 ENCOUNTER — Ambulatory Visit (HOSPITAL_COMMUNITY)
Admission: RE | Admit: 2017-08-02 | Discharge: 2017-08-02 | Disposition: A | Discharge status: Home / Self Care | Payer: Medicare Other | Source: Ambulatory Visit | Attending: Interventional Radiology | Admitting: Interventional Radiology

## 2017-08-02 DIAGNOSIS — I771 Stricture of artery: Secondary | ICD-10-CM

## 2017-08-02 DIAGNOSIS — I6503 Occlusion and stenosis of bilateral vertebral arteries: Secondary | ICD-10-CM | POA: Diagnosis not present

## 2017-08-02 HISTORY — PX: IR RADIOLOGIST EVAL & MGMT: IMG5224

## 2017-08-02 NOTE — Telephone Encounter (Signed)
Notes recorded by Laurine Blazer, LPN on 03/26/7000 at 7:49 AM EDT Patient notified. Copy to pmd & Dr. [name]. ------  Notes recorded by Laurine Blazer, LPN on 02/27/9674 at 9:16 PM EDT Per Dr. Bronson Ing, clear to proceed with surgery. ------  Notes recorded by Herminio Commons, MD on 08/01/2017 at 4:59 PM EDT LV function now normal.

## 2017-08-06 ENCOUNTER — Other Ambulatory Visit: Payer: Self-pay | Admitting: *Deleted

## 2017-08-06 ENCOUNTER — Encounter (HOSPITAL_COMMUNITY): Payer: Self-pay | Admitting: Interventional Radiology

## 2017-08-12 NOTE — Pre-Procedure Instructions (Addendum)
Roy [name]  08/12/2017      [facility] [place], [place] - [address] [place] [address] [place] [address] Phone: [phone] Fax: [phone]  [place], [place] [address] Phone: [phone] Fax: [phone]  [facility] [place], [place] - [address] AT [place] [address] Phone: [phone] Fax: [phone]  [facility] [place], [place] [address] Phone: [phone] Fax: [phone]  [facility] [place], [place] [address] [place] [address] Phone: [phone] Fax: [phone]    Your procedure is scheduled on August 16, 2017.  Report to [facility] Admitting at 830 AM.  Call this number if you have problems the morning of surgery:  [phone]   Remember:  Do not eat food or drink liquids after midnight.  Take these medicines the morning of surgery with A SIP OF WATER acetaminophen (tylenol)-if needed, carvedilol (coreg), gabapentin (neurontin), pantoprazole (protonix), paroxetine (paxil), tamsulosin (flomax).  Stop plavix as instructed by your surgeon  7 days prior to surgery STOP taking any Aspirin (unless otherwise instructed by your surgeon), Aleve, Naproxen, Ibuprofen, Motrin, Advil, Goody's, BC's, all herbal medications, fish oil, and all vitamins   Do not wear jewelry, make-up or nail polish.  Do not wear lotions, powders, or perfumes, or deoderant.  Men may shave face and neck.  Do not bring valuables to the hospital.  [facility] is not responsible for any belongings or valuables.  Contacts, dentures or bridgework may not be worn into surgery.  Leave your suitcase in the car.  After surgery it may be brought to your room.  For patients admitted to  the hospital, discharge time will be determined by your treatment team.  Patients discharged the day of surgery will not be allowed to drive home.   Special instructions:   - Preparing For Surgery  Before surgery, you can play an important role. Because skin is not sterile, your skin needs to be as free of germs as possible. You can reduce the number of germs on your skin by washing with CHG (chlorahexidine gluconate) Soap before surgery.  CHG is an antiseptic cleaner which kills germs and bonds with the skin to continue killing germs even after washing.  Please do not use if you have an allergy to CHG or antibacterial soaps. If your skin becomes reddened/irritated stop using the CHG.  Do not shave (including legs and underarms) for at least 48 hours prior to first CHG shower. It is OK to shave your face.  Please follow these instructions carefully.   1. Shower the NIGHT BEFORE SURGERY and the MORNING OF SURGERY with CHG.   2. If you chose to wash your hair, wash your hair first as usual with your normal shampoo.  3. After you shampoo, rinse your hair and body thoroughly to remove the shampoo.  4. Use CHG as you would any other liquid soap. You can apply CHG directly to the skin and wash gently with a scrungie or a clean washcloth.   5. Apply the CHG Soap to your body ONLY FROM THE NECK DOWN.  Do not use on open wounds or open sores. Avoid contact with your eyes,  ears, mouth and genitals (private parts). Wash genitals (private parts) with your normal soap.  6. Wash thoroughly, paying special attention to the area where your surgery will be performed.  7. Thoroughly rinse your body with warm water from the neck down.  8. DO NOT shower/wash with your normal soap after using and rinsing off the CHG Soap.  9. Pat yourself dry with a CLEAN TOWEL.   10. Wear CLEAN PAJAMAS   11. Place CLEAN SHEETS on your bed the night of your first shower and DO NOT SLEEP WITH PETS.    Day  of Surgery: Do not apply any deodorants/lotions. Please wear clean clothes to the hospital/surgery center.     Please read over the following fact sheets that you were given. Pain Booklet, Coughing and Deep Breathing, MRSA Information and Surgical Site Infection Prevention

## 2017-08-13 ENCOUNTER — Encounter (HOSPITAL_COMMUNITY)
Admission: RE | Admit: 2017-08-13 | Discharge: 2017-08-13 | Disposition: A | Discharge status: Home / Self Care | Payer: Medicare Other | Source: Ambulatory Visit | Attending: Surgery | Admitting: Surgery

## 2017-08-13 ENCOUNTER — Encounter (HOSPITAL_COMMUNITY): Payer: Self-pay

## 2017-08-13 DIAGNOSIS — Z01812 Encounter for preprocedural laboratory examination: Secondary | ICD-10-CM

## 2017-08-13 DIAGNOSIS — Z01818 Encounter for other preprocedural examination: Secondary | ICD-10-CM

## 2017-08-13 DIAGNOSIS — I700 Atherosclerosis of aorta: Secondary | ICD-10-CM

## 2017-08-13 DIAGNOSIS — I10 Essential (primary) hypertension: Secondary | ICD-10-CM | POA: Diagnosis not present

## 2017-08-13 LAB — CBC
HCT: 42.1 % (ref 39.0–52.0)
Hemoglobin: 14.2 g/dL (ref 13.0–17.0)
MCH: 30.9 pg (ref 26.0–34.0)
MCHC: 33.7 g/dL (ref 30.0–36.0)
MCV: 91.7 fL (ref 78.0–100.0)
PLATELETS: 243 10*3/uL (ref 150–400)
RBC: 4.59 MIL/uL (ref 4.22–5.81)
RDW: 13.5 % (ref 11.5–15.5)
WBC: 13.8 10*3/uL — ABNORMAL HIGH (ref 4.0–10.5)

## 2017-08-13 LAB — COMPREHENSIVE METABOLIC PANEL
ALT: 13 U/L — AB (ref 17–63)
ANION GAP: 7 (ref 5–15)
AST: 21 U/L (ref 15–41)
Albumin: 4.3 g/dL (ref 3.5–5.0)
Alkaline Phosphatase: 83 U/L (ref 38–126)
BUN: 7 mg/dL (ref 6–20)
CHLORIDE: 100 mmol/L — AB (ref 101–111)
CO2: 26 mmol/L (ref 22–32)
CREATININE: 0.68 mg/dL (ref 0.61–1.24)
Calcium: 9.2 mg/dL (ref 8.9–10.3)
Glucose, Bld: 110 mg/dL — ABNORMAL HIGH (ref 65–99)
POTASSIUM: 4.5 mmol/L (ref 3.5–5.1)
SODIUM: 133 mmol/L — AB (ref 135–145)
Total Bilirubin: 0.6 mg/dL (ref 0.3–1.2)
Total Protein: 6.9 g/dL (ref 6.5–8.1)

## 2017-08-13 LAB — URINALYSIS, ROUTINE W REFLEX MICROSCOPIC
BILIRUBIN URINE: NEGATIVE
Glucose, UA: NEGATIVE mg/dL
HGB URINE DIPSTICK: NEGATIVE
Ketones, ur: NEGATIVE mg/dL
Nitrite: NEGATIVE
PROTEIN: NEGATIVE mg/dL
SQUAMOUS EPITHELIAL / LPF: NONE SEEN
Specific Gravity, Urine: 1.009 (ref 1.005–1.030)
pH: 7 (ref 5.0–8.0)

## 2017-08-13 LAB — PROTIME-INR
INR: 1
PROTHROMBIN TIME: 13.1 s (ref 11.4–15.2)

## 2017-08-13 LAB — TYPE AND SCREEN
ABO/RH(D): O POS
Antibody Screen: NEGATIVE

## 2017-08-13 LAB — SURGICAL PCR SCREEN
MRSA, PCR: NEGATIVE
STAPHYLOCOCCUS AUREUS: NEGATIVE

## 2017-08-13 LAB — APTT: aPTT: 30 seconds (ref 24–36)

## 2017-08-13 MED ORDER — CHLORHEXIDINE GLUCONATE CLOTH 2 % EX PADS: 6.0000 | MEDICATED_PAD | Freq: Once | CUTANEOUS | Status: DC

## 2017-08-13 NOTE — Progress Notes (Signed)
PCP: Allyn Kenner, MD  Cardiologist: Dr. Kate Sable (Dr. Lovena Le manages ICD)  EKG: 01/21/17 in EPIC  Stress test: 08/2014 in EPIC   ECHO: 08/01/2017 (most recent) in EPIC  Cardiac Cath: pt denies ever  Chest x-ray: denies past year-no respiratory complications, infections

## 2017-08-14 ENCOUNTER — Telehealth: Payer: Self-pay | Admitting: *Deleted

## 2017-08-14 MED ORDER — CIPROFLOXACIN HCL 500 MG PO TABS: 500.0000 mg | ORAL_TABLET | Freq: Two times a day (BID) | ORAL | 0 refills | Status: DC

## 2017-08-14 NOTE — Progress Notes (Addendum)
Anesthesia Chart Review:  Pt is a 71 year old male scheduled L fem-pop bypass graft, L profunda femoral endarterectomy on 08/16/2017 with Harold Barban, MD  - PCP is Jenny Reichmann) [name], MD - Cardiologist is Kate Sable, MD who cleared pt for surgery in comment on echo report - EP cardiologist is Cristopher Peru, MD, last office visit 09/17/16  PMH includes:  CHF, AICD (St. Jude BiV, implanted 08/19/15), LBBB, TIA, HTN, hyperlipidemia, alcoholic cirrhosis, PVD, thrombocytopenia, alcohol abuse, COPD.  Current smoker. BMI 27.5  Medications include: ASA 81mg , lipitor, carvedilol, plavix, entresto, protonix, potassium. Patient to stop Plavix starting 08/09/17  BP (!) 175/71   Pulse 66   Temp 36.7 C   Resp 20   Ht 5\' 3"  (1.6 m)   Wt 155 lb 11.2 oz (70.6 kg)   SpO2 100%   BMI 27.58 kg/m   Preoperative labs reviewed.   - UA consistent with UTI - I left voicemail for RNs at Dr. Stephens Shire office about UTI  CXR 08/13/17:  - Pacemaker leads as described. No edema or consolidation. There is aortic atherosclerosis. - Aortic Atherosclerosis (ICD10-I70.0).  EKG 01/21/17: Electronic ventricular pacemaker  -possibly demand type   Echo 08/01/17:  - Left ventricle: The cavity size was normal. Wall thickness was  normal. Systolic function was normal. The estimated ejection fraction was in the range of 50% to 55%. Wall motion was normal; there were no regional wall motion abnormalities. Doppler parameters are consistent with abnormal left ventricular relaxation (grade 1 diastolic dysfunction). Indeterminate filling pressures. - Aortic valve: Trileaflet; mildly thickened leaflets. - Mitral valve: Calcified annulus. Normal thickness leaflets. There was mild regurgitation. - Atrial septum: No defect or patent foramen ovale was identified. - Impressions: When compared to the study dated 3/00/92, LV systolic function has since normalized.  Carotid duplex 06/12/17:  1. 1-39% B ICA stenoses 2. Patent distal  LICA stent with no evidence restenosis  Nuclear stress test 08/30/14: large area of inferior myocardial scar with no evidence of ischemia, and inferior akinesis.  Perioperative prescription for ICD notes procedure should not interfere with device function; no device reprogramming magnet placement needed.  If no changes, I anticipate pt can proceed with surgery as scheduled.   Willeen Cass, FNP-BC [facility]/Anesthesiology Phone: [phone] 08/14/2017 12:09 PM

## 2017-08-14 NOTE — Telephone Encounter (Signed)
Preop testing nurse called re: patient's UA showed UTI. Sent Rx for Cipro as per protocol to [facility].

## 2017-08-15 ENCOUNTER — Other Ambulatory Visit: Payer: Self-pay | Admitting: Nurse Practitioner

## 2017-08-15 ENCOUNTER — Telehealth: Payer: Self-pay | Admitting: Surgery

## 2017-08-15 NOTE — Telephone Encounter (Signed)
Pt called and said [facility] does not have his Rx for Cipro for recent UTI prior to procedure on 08/16/17. I assured him we will call meds in to his preferred pharmacy.  Thurston Hole., LPN

## 2017-08-16 ENCOUNTER — Inpatient Hospital Stay (HOSPITAL_COMMUNITY)
Admission: RE | Admit: 2017-08-16 | Discharge: 2017-08-18 | DRG: 271 | Disposition: A | Discharge status: Home / Self Care | Payer: Medicare Other | Source: Ambulatory Visit | Attending: Surgery | Admitting: Surgery

## 2017-08-16 ENCOUNTER — Encounter (HOSPITAL_COMMUNITY)
Admission: RE | Disposition: A | Discharge status: Home / Self Care | Payer: Self-pay | Source: Ambulatory Visit | Attending: Surgery

## 2017-08-16 ENCOUNTER — Inpatient Hospital Stay (HOSPITAL_COMMUNITY): Payer: Medicare Other | Admitting: Emergency Medicine

## 2017-08-16 ENCOUNTER — Encounter (HOSPITAL_COMMUNITY): Payer: Self-pay | Admitting: *Deleted

## 2017-08-16 ENCOUNTER — Inpatient Hospital Stay (HOSPITAL_COMMUNITY): Payer: Medicare Other | Admitting: Anesthesiology

## 2017-08-16 DIAGNOSIS — I5022 Chronic systolic (congestive) heart failure: Secondary | ICD-10-CM | POA: Diagnosis present

## 2017-08-16 DIAGNOSIS — Z7902 Long term (current) use of antithrombotics/antiplatelets: Secondary | ICD-10-CM

## 2017-08-16 DIAGNOSIS — Z95828 Presence of other vascular implants and grafts: Secondary | ICD-10-CM

## 2017-08-16 DIAGNOSIS — I252 Old myocardial infarction: Secondary | ICD-10-CM | POA: Diagnosis not present

## 2017-08-16 DIAGNOSIS — Z23 Encounter for immunization: Secondary | ICD-10-CM | POA: Diagnosis not present

## 2017-08-16 DIAGNOSIS — Z7951 Long term (current) use of inhaled steroids: Secondary | ICD-10-CM

## 2017-08-16 DIAGNOSIS — I70212 Atherosclerosis of native arteries of extremities with intermittent claudication, left leg: Secondary | ICD-10-CM | POA: Diagnosis not present

## 2017-08-16 DIAGNOSIS — F17210 Nicotine dependence, cigarettes, uncomplicated: Secondary | ICD-10-CM | POA: Diagnosis present

## 2017-08-16 DIAGNOSIS — I110 Hypertensive heart disease with heart failure: Secondary | ICD-10-CM | POA: Diagnosis present

## 2017-08-16 DIAGNOSIS — Z8673 Personal history of transient ischemic attack (TIA), and cerebral infarction without residual deficits: Secondary | ICD-10-CM

## 2017-08-16 DIAGNOSIS — J449 Chronic obstructive pulmonary disease, unspecified: Secondary | ICD-10-CM | POA: Diagnosis present

## 2017-08-16 DIAGNOSIS — Z7982 Long term (current) use of aspirin: Secondary | ICD-10-CM | POA: Diagnosis not present

## 2017-08-16 DIAGNOSIS — I739 Peripheral vascular disease, unspecified: Secondary | ICD-10-CM | POA: Diagnosis present

## 2017-08-16 DIAGNOSIS — E785 Hyperlipidemia, unspecified: Secondary | ICD-10-CM | POA: Diagnosis present

## 2017-08-16 DIAGNOSIS — E7800 Pure hypercholesterolemia, unspecified: Secondary | ICD-10-CM | POA: Diagnosis present

## 2017-08-16 DIAGNOSIS — I10 Essential (primary) hypertension: Secondary | ICD-10-CM | POA: Diagnosis not present

## 2017-08-16 DIAGNOSIS — Z79899 Other long term (current) drug therapy: Secondary | ICD-10-CM

## 2017-08-16 DIAGNOSIS — Z95810 Presence of automatic (implantable) cardiac defibrillator: Secondary | ICD-10-CM | POA: Diagnosis not present

## 2017-08-16 DIAGNOSIS — I255 Ischemic cardiomyopathy: Secondary | ICD-10-CM | POA: Diagnosis present

## 2017-08-16 HISTORY — PX: FEMORAL-POPLITEAL BYPASS GRAFT: SHX937

## 2017-08-16 HISTORY — PX: VEIN HARVEST: SHX6363

## 2017-08-16 HISTORY — PX: ENDARTERECTOMY FEMORAL: SHX5804

## 2017-08-16 LAB — CREATININE, SERUM
CREATININE: 0.7 mg/dL (ref 0.61–1.24)
GFR calc Af Amer: >60 mL/min (ref >60)
GFR calc non Af Amer: >60 mL/min (ref >60)

## 2017-08-16 LAB — CBC
HCT: 35.2 % — ABNORMAL LOW (ref 39.0–52.0)
Hemoglobin: 11.9 g/dL — ABNORMAL LOW (ref 13.0–17.0)
MCH: 30.8 pg (ref 26.0–34.0)
MCHC: 33.8 g/dL (ref 30.0–36.0)
MCV: 91.2 fL (ref 78.0–100.0)
PLATELETS: 210 10*3/uL (ref 150–400)
RBC: 3.86 MIL/uL — AB (ref 4.22–5.81)
RDW: 13.4 % (ref 11.5–15.5)
WBC: 10.1 10*3/uL (ref 4.0–10.5)

## 2017-08-16 SURGERY — BYPASS GRAFT FEMORAL-POPLITEAL ARTERY
Anesthesia: General | Site: Leg Lower | Laterality: Left

## 2017-08-16 MED ORDER — MIDAZOLAM HCL 5 MG/5ML IJ SOLN
INTRAMUSCULAR | Status: DC | PRN
  Administered 2017-08-16: 2 mg via INTRAVENOUS

## 2017-08-16 MED ORDER — SODIUM CHLORIDE 0.9 % IV SOLN: 500.0000 mL | Freq: Once | INTRAVENOUS | Status: DC | PRN

## 2017-08-16 MED ORDER — HYDRALAZINE HCL 20 MG/ML IJ SOLN: 5.0000 mg | INTRAMUSCULAR | Status: DC | PRN

## 2017-08-16 MED ORDER — FENTANYL CITRATE (PF) 250 MCG/5ML IJ SOLN
INTRAMUSCULAR | Status: AC
  Filled 2017-08-16: qty 5

## 2017-08-16 MED ORDER — CARVEDILOL 3.125 MG PO TABS
3.1250 mg | ORAL_TABLET | Freq: Two times a day (BID) | ORAL | Status: DC
  Administered 2017-08-16 – 2017-08-18 (×4): 3.125 mg via ORAL
  Filled 2017-08-16 (×3): qty 1

## 2017-08-16 MED ORDER — PHENYLEPHRINE HCL 10 MG/ML IJ SOLN
INTRAVENOUS | Status: DC | PRN
  Administered 2017-08-16: 50 ug/min via INTRAVENOUS

## 2017-08-16 MED ORDER — PHENYLEPHRINE 40 MCG/ML (10ML) SYRINGE FOR IV PUSH (FOR BLOOD PRESSURE SUPPORT)
PREFILLED_SYRINGE | INTRAVENOUS | Status: AC
  Filled 2017-08-16: qty 10

## 2017-08-16 MED ORDER — ROCURONIUM BROMIDE 10 MG/ML (PF) SYRINGE
PREFILLED_SYRINGE | INTRAVENOUS | Status: AC
  Filled 2017-08-16: qty 5

## 2017-08-16 MED ORDER — SACUBITRIL-VALSARTAN 49-51 MG PO TABS
1.0000 | ORAL_TABLET | Freq: Two times a day (BID) | ORAL | Status: DC
  Administered 2017-08-16 – 2017-08-18 (×4): 1 via ORAL
  Filled 2017-08-16 (×5): qty 1

## 2017-08-16 MED ORDER — EPHEDRINE 5 MG/ML INJ
INTRAVENOUS | Status: AC
  Filled 2017-08-16: qty 10

## 2017-08-16 MED ORDER — PROTAMINE SULFATE 10 MG/ML IV SOLN
INTRAVENOUS | Status: DC | PRN
  Administered 2017-08-16: 50 mg via INTRAVENOUS

## 2017-08-16 MED ORDER — EPHEDRINE SULFATE 50 MG/ML IJ SOLN
INTRAMUSCULAR | Status: DC | PRN
  Administered 2017-08-16 (×3): 2.5 mg via INTRAVENOUS
  Administered 2017-08-16: 5 mg via INTRAVENOUS
  Administered 2017-08-16: 2.5 mg via INTRAVENOUS
  Administered 2017-08-16: 5 mg via INTRAVENOUS

## 2017-08-16 MED ORDER — GUAIFENESIN-DM 100-10 MG/5ML PO SYRP: 15.0000 mL | ORAL_SOLUTION | ORAL | Status: DC | PRN

## 2017-08-16 MED ORDER — ATORVASTATIN CALCIUM 20 MG PO TABS
20.0000 mg | ORAL_TABLET | Freq: Every day | ORAL | Status: DC
  Administered 2017-08-16 – 2017-08-17 (×2): 20 mg via ORAL
  Filled 2017-08-16 (×2): qty 1

## 2017-08-16 MED ORDER — ENOXAPARIN SODIUM 30 MG/0.3ML ~~LOC~~ SOLN
30.0000 mg | SUBCUTANEOUS | Status: DC
  Administered 2017-08-17: 30 mg via SUBCUTANEOUS
  Filled 2017-08-16: qty 0.3

## 2017-08-16 MED ORDER — CLOPIDOGREL BISULFATE 75 MG PO TABS
75.0000 mg | ORAL_TABLET | Freq: Every evening | ORAL | Status: DC
  Administered 2017-08-16 – 2017-08-17 (×2): 75 mg via ORAL
  Filled 2017-08-16 (×2): qty 1

## 2017-08-16 MED ORDER — MORPHINE SULFATE (PF) 2 MG/ML IV SOLN: 2.0000 mg | INTRAVENOUS | Status: DC | PRN

## 2017-08-16 MED ORDER — HYDROMORPHONE HCL 1 MG/ML IJ SOLN
0.2500 mg | INTRAMUSCULAR | Status: DC | PRN
  Administered 2017-08-16: 0.5 mg via INTRAVENOUS

## 2017-08-16 MED ORDER — FOLIC ACID 1 MG PO TABS
1000.0000 ug | ORAL_TABLET | Freq: Every day | ORAL | Status: DC
  Administered 2017-08-17 – 2017-08-18 (×2): 1 mg via ORAL
  Filled 2017-08-16 (×2): qty 1

## 2017-08-16 MED ORDER — PAROXETINE HCL 20 MG PO TABS
20.0000 mg | ORAL_TABLET | Freq: Every day | ORAL | Status: DC
  Administered 2017-08-17 – 2017-08-18 (×2): 20 mg via ORAL
  Filled 2017-08-16 (×2): qty 1

## 2017-08-16 MED ORDER — DOCUSATE SODIUM 100 MG PO CAPS
100.0000 mg | ORAL_CAPSULE | Freq: Every day | ORAL | Status: DC
  Administered 2017-08-17 – 2017-08-18 (×2): 100 mg via ORAL
  Filled 2017-08-16 (×2): qty 1

## 2017-08-16 MED ORDER — LIDOCAINE HCL (CARDIAC) 20 MG/ML IV SOLN
INTRAVENOUS | Status: DC | PRN
  Administered 2017-08-16: 60 mg via INTRAVENOUS

## 2017-08-16 MED ORDER — HEMOSTATIC AGENTS (NO CHARGE) OPTIME
TOPICAL | Status: DC | PRN
  Administered 2017-08-16: 2 via TOPICAL

## 2017-08-16 MED ORDER — DEXAMETHASONE SODIUM PHOSPHATE 10 MG/ML IJ SOLN
INTRAMUSCULAR | Status: DC | PRN
  Administered 2017-08-16: 10 mg via INTRAVENOUS

## 2017-08-16 MED ORDER — ALUM & MAG HYDROXIDE-SIMETH 200-200-20 MG/5ML PO SUSP: 15.0000 mL | ORAL | Status: DC | PRN

## 2017-08-16 MED ORDER — ACETAMINOPHEN 325 MG RE SUPP
325.0000 mg | RECTAL | Status: DC | PRN
Start: 2017-08-16 — End: 2017-08-18
  Filled 2017-08-16: qty 2

## 2017-08-16 MED ORDER — HEPARIN SODIUM (PORCINE) 1000 UNIT/ML IJ SOLN
INTRAMUSCULAR | Status: AC
  Filled 2017-08-16: qty 1

## 2017-08-16 MED ORDER — DEXTROSE 5 % IV SOLN
1.5000 g | INTRAVENOUS | Status: AC
  Administered 2017-08-16: 1.5 g via INTRAVENOUS
  Filled 2017-08-16: qty 1.5

## 2017-08-16 MED ORDER — INFLUENZA VAC SPLIT HIGH-DOSE 0.5 ML IM SUSY
0.5000 mL | PREFILLED_SYRINGE | INTRAMUSCULAR | Status: AC
  Administered 2017-08-18: 0.5 mL via INTRAMUSCULAR
  Filled 2017-08-16: qty 0.5

## 2017-08-16 MED ORDER — TAMSULOSIN HCL 0.4 MG PO CAPS
0.4000 mg | ORAL_CAPSULE | Freq: Every day | ORAL | Status: DC
  Administered 2017-08-17 – 2017-08-18 (×2): 0.4 mg via ORAL
  Filled 2017-08-16 (×2): qty 1

## 2017-08-16 MED ORDER — GABAPENTIN 300 MG PO CAPS
300.0000 mg | ORAL_CAPSULE | Freq: Two times a day (BID) | ORAL | Status: DC
  Administered 2017-08-16 – 2017-08-18 (×4): 300 mg via ORAL
  Filled 2017-08-16 (×4): qty 1

## 2017-08-16 MED ORDER — PROPOFOL 10 MG/ML IV BOLUS
INTRAVENOUS | Status: DC | PRN
  Administered 2017-08-16: 90 mg via INTRAVENOUS

## 2017-08-16 MED ORDER — LIDOCAINE 2% (20 MG/ML) 5 ML SYRINGE
INTRAMUSCULAR | Status: AC
  Filled 2017-08-16: qty 5

## 2017-08-16 MED ORDER — HYDROMORPHONE HCL 1 MG/ML IJ SOLN
INTRAMUSCULAR | Status: AC
Start: 2017-08-16 — End: 2017-08-16
  Administered 2017-08-16: 0.5 mg via INTRAVENOUS
  Filled 2017-08-16: qty 1

## 2017-08-16 MED ORDER — ACETAMINOPHEN 325 MG PO TABS: 325.0000 mg | ORAL_TABLET | ORAL | Status: DC | PRN

## 2017-08-16 MED ORDER — PHENYLEPHRINE HCL 10 MG/ML IJ SOLN
INTRAMUSCULAR | Status: DC | PRN
  Administered 2017-08-16: 40 ug via INTRAVENOUS
  Administered 2017-08-16: 80 ug via INTRAVENOUS
  Administered 2017-08-16: 40 ug via INTRAVENOUS

## 2017-08-16 MED ORDER — DEXTROSE 5 % IV SOLN
1.5000 g | Freq: Two times a day (BID) | INTRAVENOUS | Status: AC
  Administered 2017-08-16 – 2017-08-17 (×2): 1.5 g via INTRAVENOUS
  Filled 2017-08-16 (×2): qty 1.5

## 2017-08-16 MED ORDER — DEXAMETHASONE SODIUM PHOSPHATE 10 MG/ML IJ SOLN
INTRAMUSCULAR | Status: AC
  Filled 2017-08-16: qty 1

## 2017-08-16 MED ORDER — LABETALOL HCL 5 MG/ML IV SOLN: 10.0000 mg | INTRAVENOUS | Status: DC | PRN

## 2017-08-16 MED ORDER — ASPIRIN EC 81 MG PO TBEC: 81.0000 mg | DELAYED_RELEASE_TABLET | Freq: Every evening | ORAL | Status: DC

## 2017-08-16 MED ORDER — ADULT MULTIVITAMIN W/MINERALS CH
1.0000 | ORAL_TABLET | Freq: Every day | ORAL | Status: DC
  Administered 2017-08-17 – 2017-08-18 (×2): 1 via ORAL
  Filled 2017-08-16 (×2): qty 1

## 2017-08-16 MED ORDER — ONDANSETRON HCL 4 MG/2ML IJ SOLN: 4.0000 mg | Freq: Four times a day (QID) | INTRAMUSCULAR | Status: DC | PRN

## 2017-08-16 MED ORDER — OXYCODONE-ACETAMINOPHEN 5-325 MG PO TABS
ORAL_TABLET | ORAL | Status: AC
  Administered 2017-08-16: 2 via ORAL
  Filled 2017-08-16: qty 2

## 2017-08-16 MED ORDER — NICOTINE 21 MG/24HR TD PT24
21.0000 mg | MEDICATED_PATCH | Freq: Every day | TRANSDERMAL | Status: DC
Start: 2017-08-16 — End: 2017-08-18
  Administered 2017-08-16 – 2017-08-17 (×2): 21 mg via TRANSDERMAL
  Filled 2017-08-16 (×3): qty 1

## 2017-08-16 MED ORDER — POTASSIUM CHLORIDE CRYS ER 20 MEQ PO TBCR: 20.0000 meq | EXTENDED_RELEASE_TABLET | Freq: Every day | ORAL | Status: DC | PRN

## 2017-08-16 MED ORDER — MIDAZOLAM HCL 2 MG/2ML IJ SOLN
INTRAMUSCULAR | Status: AC
  Filled 2017-08-16: qty 2

## 2017-08-16 MED ORDER — OXYCODONE-ACETAMINOPHEN 5-325 MG PO TABS
1.0000 | ORAL_TABLET | ORAL | Status: DC | PRN
  Administered 2017-08-16 – 2017-08-18 (×3): 2 via ORAL
  Filled 2017-08-16 (×2): qty 2

## 2017-08-16 MED ORDER — 0.9 % SODIUM CHLORIDE (POUR BTL) OPTIME
TOPICAL | Status: DC | PRN
  Administered 2017-08-16: 2000 mL

## 2017-08-16 MED ORDER — OXYCODONE HCL 5 MG PO TABS: 5.0000 mg | ORAL_TABLET | Freq: Once | ORAL | Status: DC | PRN

## 2017-08-16 MED ORDER — SUGAMMADEX SODIUM 200 MG/2ML IV SOLN
INTRAVENOUS | Status: DC | PRN
  Administered 2017-08-16: 150 mg via INTRAVENOUS

## 2017-08-16 MED ORDER — ROCURONIUM BROMIDE 100 MG/10ML IV SOLN
INTRAVENOUS | Status: DC | PRN
  Administered 2017-08-16 (×2): 20 mg via INTRAVENOUS
  Administered 2017-08-16: 50 mg via INTRAVENOUS
  Administered 2017-08-16: 10 mg via INTRAVENOUS

## 2017-08-16 MED ORDER — ONDANSETRON HCL 4 MG/2ML IJ SOLN
INTRAMUSCULAR | Status: AC
  Filled 2017-08-16: qty 2

## 2017-08-16 MED ORDER — ASPIRIN EC 81 MG PO TBEC
81.0000 mg | DELAYED_RELEASE_TABLET | Freq: Every evening | ORAL | Status: DC
  Administered 2017-08-16 – 2017-08-17 (×2): 81 mg via ORAL
  Filled 2017-08-16 (×2): qty 1

## 2017-08-16 MED ORDER — OXYCODONE HCL 5 MG/5ML PO SOLN: 5.0000 mg | Freq: Once | ORAL | Status: DC | PRN

## 2017-08-16 MED ORDER — LACTATED RINGERS IV SOLN
INTRAVENOUS | Status: DC
Start: 2017-08-16 — End: 2017-08-16
  Administered 2017-08-16 (×2): via INTRAVENOUS

## 2017-08-16 MED ORDER — FENTANYL CITRATE (PF) 100 MCG/2ML IJ SOLN
INTRAMUSCULAR | Status: DC | PRN
  Administered 2017-08-16 (×2): 50 ug via INTRAVENOUS
  Administered 2017-08-16: 100 ug via INTRAVENOUS
  Administered 2017-08-16: 150 ug via INTRAVENOUS
  Administered 2017-08-16: 50 ug via INTRAVENOUS

## 2017-08-16 MED ORDER — SODIUM CHLORIDE 0.9 % IV SOLN: INTRAVENOUS | Status: DC

## 2017-08-16 MED ORDER — MAGNESIUM SULFATE 2 GM/50ML IV SOLN
2.0000 g | Freq: Every day | INTRAVENOUS | Status: DC | PRN
  Filled 2017-08-16: qty 50

## 2017-08-16 MED ORDER — PHENOL 1.4 % MT LIQD: 1.0000 | OROMUCOSAL | Status: DC | PRN

## 2017-08-16 MED ORDER — PANTOPRAZOLE SODIUM 40 MG PO TBEC
40.0000 mg | DELAYED_RELEASE_TABLET | Freq: Every day | ORAL | Status: DC
  Administered 2017-08-17 – 2017-08-18 (×2): 40 mg via ORAL
  Filled 2017-08-16 (×2): qty 1

## 2017-08-16 MED ORDER — SODIUM CHLORIDE 0.9 % IV SOLN
INTRAVENOUS | Status: DC
  Administered 2017-08-16: 19:00:00 via INTRAVENOUS

## 2017-08-16 MED ORDER — HEPARIN SODIUM (PORCINE) 1000 UNIT/ML IJ SOLN
INTRAMUSCULAR | Status: DC | PRN
  Administered 2017-08-16: 7000 [IU] via INTRAVENOUS
  Administered 2017-08-16: 1000 [IU] via INTRAVENOUS

## 2017-08-16 MED ORDER — ONDANSETRON HCL 4 MG/2ML IJ SOLN
INTRAMUSCULAR | Status: DC | PRN
  Administered 2017-08-16: 4 mg via INTRAVENOUS

## 2017-08-16 MED ORDER — METOPROLOL TARTRATE 5 MG/5ML IV SOLN: 2.0000 mg | INTRAVENOUS | Status: DC | PRN

## 2017-08-16 MED ORDER — LACTATED RINGERS IV SOLN
INTRAVENOUS | Status: DC | PRN
  Administered 2017-08-16 (×3): via INTRAVENOUS

## 2017-08-16 MED ORDER — PROPOFOL 10 MG/ML IV BOLUS
INTRAVENOUS | Status: AC
  Filled 2017-08-16: qty 20

## 2017-08-16 MED ORDER — CIPROFLOXACIN HCL 500 MG PO TABS
500.0000 mg | ORAL_TABLET | Freq: Two times a day (BID) | ORAL | Status: DC
Start: 2017-08-16 — End: 2017-08-18
  Administered 2017-08-16 – 2017-08-18 (×4): 500 mg via ORAL
  Filled 2017-08-16 (×4): qty 1

## 2017-08-16 MED ORDER — SODIUM CHLORIDE 0.9 % IV SOLN
INTRAVENOUS | Status: DC | PRN
  Administered 2017-08-16: 11:00:00 500 mL

## 2017-08-16 MED ORDER — DM-GUAIFENESIN ER 30-600 MG PO TB12
1.0000 | ORAL_TABLET | Freq: Every day | ORAL | Status: DC
  Administered 2017-08-16 – 2017-08-18 (×3): 1 via ORAL
  Filled 2017-08-16 (×3): qty 1

## 2017-08-16 SURGICAL SUPPLY — 63 items
ADH SKN CLS APL DERMABOND .7 (GAUZE/BANDAGES/DRESSINGS) ×6
BANDAGE ESMARK 6X9 LF (GAUZE/BANDAGES/DRESSINGS) IMPLANT
BNDG CMPR 9X6 STRL LF SNTH (GAUZE/BANDAGES/DRESSINGS) ×2
BNDG ESMARK 6X9 LF (GAUZE/BANDAGES/DRESSINGS) ×4
CANISTER SUCT 3000ML PPV (MISCELLANEOUS) ×4 IMPLANT
CANNULA VESSEL 3MM 2 BLNT TIP (CANNULA) ×4 IMPLANT
CATH EMB 5FR 80CM (CATHETERS) ×2 IMPLANT
CLIP VESOCCLUDE MED 24/CT (CLIP) ×4 IMPLANT
CLIP VESOCCLUDE SM WIDE 24/CT (CLIP) ×4 IMPLANT
CUFF TOURNIQUET SINGLE 24IN (TOURNIQUET CUFF) IMPLANT
CUFF TOURNIQUET SINGLE 34IN LL (TOURNIQUET CUFF) ×2 IMPLANT
CUFF TOURNIQUET SINGLE 44IN (TOURNIQUET CUFF) IMPLANT
DERMABOND ADVANCED (GAUZE/BANDAGES/DRESSINGS) ×6
DERMABOND ADVANCED .7 DNX12 (GAUZE/BANDAGES/DRESSINGS) ×2 IMPLANT
DRAIN CHANNEL 15F RND FF W/TCR (WOUND CARE) IMPLANT
DRAPE HALF SHEET 40X57 (DRAPES) IMPLANT
DRAPE X-RAY CASS 24X20 (DRAPES) IMPLANT
ELECT REM PT RETURN 9FT ADLT (ELECTROSURGICAL) ×4
ELECTRODE REM PT RTRN 9FT ADLT (ELECTROSURGICAL) ×2 IMPLANT
EVACUATOR SILICONE 100CC (DRAIN) IMPLANT
GLOVE BIO SURGEON STRL SZ 6.5 (GLOVE) ×4 IMPLANT
GLOVE BIO SURGEONS STRL SZ 6.5 (GLOVE) ×4
GLOVE BIOGEL PI IND STRL 6.5 (GLOVE) IMPLANT
GLOVE BIOGEL PI IND STRL 7.5 (GLOVE) ×2 IMPLANT
GLOVE BIOGEL PI INDICATOR 6.5 (GLOVE) ×6
GLOVE BIOGEL PI INDICATOR 7.5 (GLOVE) ×2
GLOVE ECLIPSE 6.5 STRL STRAW (GLOVE) ×2 IMPLANT
GLOVE SURG SS PI 6.5 STRL IVOR (GLOVE) ×4 IMPLANT
GLOVE SURG SS PI 7.5 STRL IVOR (GLOVE) ×4 IMPLANT
GOWN STRL REUS W/ TWL LRG LVL3 (GOWN DISPOSABLE) ×4 IMPLANT
GOWN STRL REUS W/ TWL XL LVL3 (GOWN DISPOSABLE) ×2 IMPLANT
GOWN STRL REUS W/TWL LRG LVL3 (GOWN DISPOSABLE) ×20
GOWN STRL REUS W/TWL XL LVL3 (GOWN DISPOSABLE) ×4
HEMOSTAT SNOW SURGICEL 2X4 (HEMOSTASIS) ×4 IMPLANT
INSERT FOGARTY SM (MISCELLANEOUS) IMPLANT
KIT BASIN OR (CUSTOM PROCEDURE TRAY) ×4 IMPLANT
KIT ROOM TURNOVER OR (KITS) ×4 IMPLANT
MARKER GRAFT CORONARY BYPASS (MISCELLANEOUS) IMPLANT
NS IRRIG 1000ML POUR BTL (IV SOLUTION) ×8 IMPLANT
PACK PERIPHERAL VASCULAR (CUSTOM PROCEDURE TRAY) ×4 IMPLANT
PAD ARMBOARD 7.5X6 YLW CONV (MISCELLANEOUS) ×8 IMPLANT
SET COLLECT BLD 21X3/4 12 (NEEDLE) IMPLANT
SPONGE LAP 18X18 X RAY DECT (DISPOSABLE) ×2 IMPLANT
STOPCOCK 4 WAY LG BORE MALE ST (IV SETS) ×2 IMPLANT
SUT ETHILON 3 0 PS 1 (SUTURE) IMPLANT
SUT GORETEX 6.0 TT13 (SUTURE) IMPLANT
SUT GORETEX 6.0 TT9 (SUTURE) IMPLANT
SUT PROLENE 5 0 C 1 24 (SUTURE) ×6 IMPLANT
SUT PROLENE 6 0 BV (SUTURE) ×10 IMPLANT
SUT PROLENE 7 0 BV 1 (SUTURE) ×2 IMPLANT
SUT SILK 2 0 SH (SUTURE) ×4 IMPLANT
SUT SILK 3 0 (SUTURE) ×8
SUT SILK 3-0 18XBRD TIE 12 (SUTURE) IMPLANT
SUT VIC AB 2-0 CT1 27 (SUTURE) ×12
SUT VIC AB 2-0 CT1 TAPERPNT 27 (SUTURE) ×4 IMPLANT
SUT VIC AB 3-0 SH 27 (SUTURE) ×16
SUT VIC AB 3-0 SH 27X BRD (SUTURE) ×4 IMPLANT
SUT VICRYL 4-0 PS2 18IN ABS (SUTURE) ×8 IMPLANT
SYR 3ML LL SCALE MARK (SYRINGE) ×2 IMPLANT
TRAY FOLEY W/METER SILVER 16FR (SET/KITS/TRAYS/PACK) ×4 IMPLANT
TUBING EXTENTION W/L.L. (IV SETS) IMPLANT
UNDERPAD 30X30 (UNDERPADS AND DIAPERS) ×4 IMPLANT
WATER STERILE IRR 1000ML POUR (IV SOLUTION) ×4 IMPLANT

## 2017-08-16 NOTE — Anesthesia Postprocedure Evaluation (Signed)
Anesthesia Post Note  Patient: Roy [name]  Procedure(s) Performed: Procedure(s) (LRB): LEFT  FEMORAL-POPLITEAL ARTERY  BYPASS GRAFT (Left) ENDARTERECTOMY LEFT PROFUNDA FEMORAL (Left) USING NON REVERSE LEFT GREATER SAPHENOUS VEIN HARVEST (Left)     Patient location during evaluation: PACU Anesthesia Type: General Level of consciousness: awake and alert Pain management: pain level controlled Vital Signs Assessment: post-procedure vital signs reviewed and stable Respiratory status: spontaneous breathing, nonlabored ventilation, respiratory function stable and patient connected to nasal cannula oxygen Cardiovascular status: blood pressure returned to baseline and stable Postop Assessment: no apparent nausea or vomiting Anesthetic complications: no    Last Vitals:  Vitals:   08/16/17 1615 08/16/17 1630  BP: (!) 103/58 (!) 111/53  Pulse: 87 87  Resp: 14 13  Temp:    SpO2: 98% 98%    Last Pain:  Vitals:   08/16/17 1445  TempSrc:   PainSc: 7                  Ryan P Ellender

## 2017-08-16 NOTE — Anesthesia Procedure Notes (Signed)
Arterial Line Insertion Start/End9/21/2018 9:56 AM Performed by: Josephine Igo, CRNA  Patient location: Pre-op. Preanesthetic checklist: patient identified, IV checked, risks and benefits discussed and surgical consent Lidocaine 1% used for infiltration Left, radial was placed Catheter size: 20 G Hand hygiene performed  and maximum sterile barriers used   Attempts: 1 Procedure performed without using ultrasound guided technique. Following insertion, dressing applied and Biopatch. Post procedure assessment: normal  Patient tolerated the procedure well with no immediate complications.

## 2017-08-16 NOTE — Transfer of Care (Signed)
Immediate Anesthesia Transfer of Care Note  Patient: Roy [name]  Procedure(s) Performed: Procedure(s): LEFT  FEMORAL-POPLITEAL ARTERY  BYPASS GRAFT (Left) ENDARTERECTOMY LEFT PROFUNDA FEMORAL (Left) USING NON REVERSE LEFT GREATER SAPHENOUS VEIN HARVEST (Left)  Patient Location: PACU  Anesthesia Type:General  Level of Consciousness: awake, alert , oriented and drowsy  Airway & Oxygen Therapy: Patient Spontanous Breathing and Patient connected to nasal cannula oxygen  Post-op Assessment: Report given to RN, Post -op Vital signs reviewed and stable, Patient moving all extremities and Patient moving all extremities X 4  Post vital signs: Reviewed and stable  Last Vitals:  Vitals:   08/16/17 1443 08/16/17 1445  BP:  128/69  Pulse: (!) 103 (!) 103  Resp: 17 17  Temp: 36.9 C   SpO2:      Last Pain:  Vitals:   08/16/17 0819  TempSrc: Oral      Patients Stated Pain Goal: 4 (78/67/54 4920)  Complications: No apparent anesthesia complications

## 2017-08-16 NOTE — Anesthesia Preprocedure Evaluation (Signed)
Anesthesia Evaluation  Patient identified by MRN, date of birth, ID band Patient awake    Reviewed: Allergy & Precautions, NPO status , Patient's Chart, lab work & pertinent test results  Airway Mallampati: II   Neck ROM: full    Dental   Pulmonary COPD, Current Smoker,    breath sounds clear to auscultation       Cardiovascular hypertension, + Past MI, + Peripheral Vascular Disease and +CHF  + dysrhythmias + pacemaker + Cardiac Defibrillator  Rhythm:regular Rate:Normal  EF 50-55%   Neuro/Psych    GI/Hepatic (+) Cirrhosis     substance abuse  alcohol use,   Endo/Other    Renal/GU      Musculoskeletal  (+) Arthritis ,   Abdominal   Peds  Hematology   Anesthesia Other Findings   Reproductive/Obstetrics                             Anesthesia Physical Anesthesia Plan  ASA: IV  Anesthesia Plan: General   Post-op Pain Management:    Induction: Intravenous  PONV Risk Score and Plan: 1 and Ondansetron, Dexamethasone, Midazolam and Treatment may vary due to age or medical condition  Airway Management Planned: Oral ETT  Additional Equipment: Arterial line  Intra-op Plan:   Post-operative Plan: Extubation in OR  Informed Consent: I have reviewed the patients History and Physical, chart, labs and discussed the procedure including the risks, benefits and alternatives for the proposed anesthesia with the patient or authorized representative who has indicated his/her understanding and acceptance.     Plan Discussed with: CRNA, Anesthesiologist and Surgeon  Anesthesia Plan Comments:         Anesthesia Quick Evaluation

## 2017-08-16 NOTE — Anesthesia Procedure Notes (Signed)
Procedure Name: Intubation Date/Time: 08/16/2017 10:22 AM Performed by: Izora Gala Pre-anesthesia Checklist: Patient identified, Emergency Drugs available, Suction available and Patient being monitored Patient Re-evaluated:Patient Re-evaluated prior to induction Oxygen Delivery Method: Circle system utilized Preoxygenation: Pre-oxygenation with 100% oxygen Induction Type: IV induction Ventilation: Mask ventilation without difficulty Laryngoscope Size: Miller and 3 Grade View: Grade I Tube type: Oral Tube size: 7.5 mm Number of attempts: 1 Airway Equipment and Method: Stylet and LTA kit utilized Placement Confirmation: ETT inserted through vocal cords under direct vision,  positive ETCO2 and breath sounds checked- equal and bilateral Secured at: 22 cm Tube secured with: Tape Dental Injury: Teeth and Oropharynx as per pre-operative assessment

## 2017-08-16 NOTE — Interval H&P Note (Signed)
History and Physical Interval Note:  08/16/2017 9:32 AM  Roy [name]  has presented today for surgery, with the diagnosis of peripheral vascular disease  The various methods of treatment have been discussed with the patient and family. After consideration of risks, benefits and other options for treatment, the patient has consented to  Procedure(s): BYPASS GRAFT FEMORAL-POPLITEAL ARTERY WITH VEIN (Left) ENDARTERECTOMY LEFT PROFUNDA FEMORAL (Left) as a surgical intervention .  The patient's history has been reviewed, patient examined, no change in status, stable for surgery.  I have reviewed the patient's chart and labs.  Questions were answered to the patient's satisfaction.     Annamarie Major

## 2017-08-16 NOTE — Op Note (Signed)
Patient name: Roy [name] MRN: [id number] DOB: 10/21/1946 Sex: male  08/16/2017 Pre-operative Diagnosis: Left Leg claudication Post-operative diagnosis:  Same Surgeon:  Annamarie Major Assistants:  Leontine Locket Procedure:   #1: Left femoral to above-knee popliteal artery bypass graft with ipsilateral non-reversed greater saphenous vein   #2: Extensive left profunda femoral endarterectomy   #3: Left external iliac artery endarterectomy Anesthesia:  Gen. Blood Loss:  See anesthesia record Specimens:  None  Findings:  Healthy-appearing above-knee popliteal artery.  The patient had extensive heavily calcified disease within the external iliac, common femoral, and profunda femoral artery.  I dissected down approximately 5 cm on the profundofemoral artery.  I used the vein as a patch for the arteriotomy.  Indications:  The patient has significant lifestyle limiting claudication.  He is here today for bypass.  Procedure:  The patient was identified in the holding area and taken to [place] 11  The patient was then placed supine on the table. general anesthesia was administered.  The patient was prepped and draped in the usual sterile fashion.  A time out was called and antibiotics were administered.  Ultrasound was used to map the course of the saphenous vein in the left leg.  It appeared to be adequate.  I then made a longitudinal incision in the groin.  Cautery was used to divide subcutaneous tissue down to the femoral sheath which was opened sharply.  I dissected out the common femoral artery from the inguinal ligament down to the bifurcation.  I then dissected out the superficial femoral artery which was circumferentially calcified, as well as the profunda femoral artery.  The profunda was also circumferentially calcified.  I dissected out proximally 5 cm of the artery and never got to a soft area.  At this point attention was turned to saphenous vein.  I identified the saphenofemoral  junction.  I then dissected out the saphenous vein, ligating side branches between silk ties.  This was done through multiple skip incisions to the lower leg.  The distal medial incision was used to harvest both the saphenous vein as well as dissect out the popliteal artery.  The popliteal artery at this level was not heavily calcified and appeared to be healthy.  Next, the vein was removed.  I oversewed the saphenofemoral junction with 2 layers of 5-0 Prolene.  The vein was prepared on the back table and it distended nicely.  It was marked for orientation.  The patient was fully heparinized.  After the heparin circulated, I occluded the common femoral profunda femoral and superficial femoral artery.  A #11 blade was used to make an arteriotomy in the common femoral artery which was extended proximally.  I then opened the profunda femoral artery which was nearly occluded.  I opened the profundofemoral artery for approximately 3 cm and perform endarterectomy.  I got to where there was a reasonable endpoint and stopped the endarterectomy at this level.  I did place one tacking 7-0 proline suture the plaque.  Next, I proceeded with endarterectomy of the common femoral artery.  In order to get to the distal part of the plaque, I laced day #5 Fogarty balloon into the iliac artery for proximal control.  I performed endarterectomy of the common femoral and distal external iliac artery.  There was good inflow.  I nature there was no potential embolic debris.  Next the vein was brought onto the table.  It was placed in non-reversed fashion.  I spatulated the end  to match the size of the arteriotomy.  A running anastomosis was created with 6-0 Prolene.  Prior to completion, the appropriate flushing maneuvers were performed and the anastomosis was completed.  I then used a Mills valvulotome to lyse the valves.  Once this was done there was excellent pulsatile flow through the vein graft.  I then created a subsartorial tunnel  and brought the vein graft through the tunnel, making sure proper orientation was maintained.  I then placed a web roll on the upper thigh followed by tourniquet.  The leg was exsanguinated.  The tourniquet was taken to 250 mm of pressure.  An 11 blade was used to make an arteriotomy in the above-knee popliteal artery.  This was extended longitudinally with Potts scissors.  The vein graft was cut to the appropriate length and spatulated to fit the size of the arteriotomy.  The length was determined with the leg in full extension.  A running anastomosis was created with 6-0 Prolene.  Prior to completion, the tourniquet was let down and the appropriate flushing maneuvers were performed.  The anastomosis was then completed.  The patient had a graft dependent Doppler signal at the ankle.  50 mg protamine was given.  Once hemostasis was satisfactory, the groin was closed by reapproximating the femoral sheath with 2-0 Vicryl.  Subcutaneous tissue was closed with additional layers of 3-0 Vicryl followed by 4-0 Vicryl the skin.  The medial above-knee incision was closed by reapproximating the fascia with 2-0 Vicryl followed by 30 and 4-0 Vicryl.  The vein harvest sites were closed with 2 layers of 3-0 Vicryl.  Dermabond was placed in all the incisions.  There were no immediate complications.   Disposition:  To PACU stable   V. Annamarie Major, M.D. [facility] [place] Office: [phone] Pager:  [phone]

## 2017-08-16 NOTE — H&P (View-Only) (Signed)
[facility] [place]  Patient name: Roy [name] MRN: [id number] DOB: Jun 12, 1946 Sex: male   REASON FOR VISIT:    Follow up  HISOTRY OF PRESENT ILLNESS:    This is a 71 year old gentleman who returns today for follow-up of his claudication symptoms.  He is status post bilateral iliac stenting in 2015.  This was done for lifestyle limiting claudication.  An 8 x 40 self extending stent was placed on the left and 8 x 80 on the right.  He had moderate left superficial femoral artery stenosis within the adductor canal and right common femoral artery stenosis.  The patient had been doing well up until about 3 months ago.  At that time he developed claudication symptoms at 100 feet that were alleviated with rest.  He initially has pain in his calf but then this progresses to his hips.  The patient is unable to tolerate his level of disability once to proceed with revascularization.  His left leg bothers him more than the right.  He does not have any open wounds currently.  The patient continues to smoke.  He is on aspirin and Plavix for a intracranial stent.  He is medically managed for hypercholesterolemia with a statin.  His blood pressure is medically managed.   PAST MEDICAL HISTORY:   Past Medical History:  Diagnosis Date  . AICD (automatic cardioverter/defibrillator) present 08/19/2015   St Jude BiV ICD for primary prevention by Dr. Lovena Le  . Alcohol abuse    6 beers per day; hospital admission in 2009 for withdrawal symptoms  . Alcoholic cirrhosis ([place])   . Anxiety and depression   . Cerebrovascular disease 2009   TIA; 2009- right ICA stent; re-intervention for restenosis complicated by [facility] w/o sx  . CHF (congestive heart failure) ([place]) 06-02-14  . Chronic obstructive pulmonary disease ([place])   . Degenerative joint disease    Total shoulder arthroplasty-right  . Hyperlipidemia   . Hypertension   . LBBB (left bundle branch  block)    Normal echo-2011; stress nuclear in 09/2010--septal hypoperfusion representing nontransmural infarction or the effect of left bundle branch block, no ischemia  . Myocardial infarction [facility]) June 02, 2014   Massive Heart Attack  . Peripheral vascular disease ([place])   . 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  . Thrombocytopenia ([place])   . Tobacco abuse    -100 pack years; 1.5 packs per day  . Tubular adenoma of colon      FAMILY HISTORY:   Family History  Problem Relation Age of Onset  . Coronary artery disease Mother   . Diabetes Mother   . Heart disease Mother        Before age 50 - 11 Bypasses  . Hypertension Mother   . Heart attack Mother        3-4 Heart attacks  . Alzheimer's disease Father   . Diabetes Father   . Heart attack Sister   . Cancer Brother        Prostate  . Hyperlipidemia Son   . Prostate cancer Brother   . Alzheimer's disease Sister   . Colon cancer Neg Hx     SOCIAL HISTORY:   Social History  Substance Use Topics  . Smoking status: Current Every Day Smoker    Packs/day: 1.00    Years: 60.00    Types: E-cigarettes, Cigarettes    Start date: 08/20/1957  . Smokeless tobacco: Never Used  . Alcohol use No  Comment: quit in July 2015; No ETOH to date 02/14/15     ALLERGIES:   No Known Allergies   CURRENT MEDICATIONS:   Current Outpatient Prescriptions  Medication Sig Dispense Refill  . acetaminophen (TYLENOL) 500 MG tablet Take 500 mg by mouth daily as needed for moderate pain.    [name] aspirin EC 81 MG tablet Take 81 mg by mouth every evening.     [name] atorvastatin (LIPITOR) 20 MG tablet Take 20 mg by mouth daily.     . carvedilol (COREG) 3.125 MG tablet Take 1 tablet (3.125 mg total) by mouth 2 (two) times daily. 180 tablet 3  . clopidogrel (PLAVIX) 75 MG tablet Take 75 mg by mouth every evening.     [name] ENTRESTO 49-51 MG TAKE 1 TABLET TWICE A DAY 180 tablet 3  . Flax Oil-Fish Oil-Borage Oil (FISH OIL-FLAX OIL-BORAGE OIL) CAPS  Take 1 capsule by mouth daily.    . fluticasone (FLONASE) 50 MCG/ACT nasal spray Place 1 spray into both nostrils daily as needed for allergies or rhinitis.    . folic acid (FOLVITE) 993 MCG tablet Take 800 mcg by mouth daily.    . furosemide (LASIX) 20 MG tablet Take 20 mg by mouth daily.     [name] gabapentin (NEURONTIN) 300 MG capsule Take 300 mg by mouth 2 (two) times daily.     . Iron-FA-B Cmp-C-Biot-Probiotic (FUSION PLUS) CAPS Take 1 capsule by mouth daily.     . Multiple Vitamins-Minerals (CENTRUM SILVER PO) Take 1 tablet by mouth daily.     . pantoprazole (PROTONIX) 40 MG tablet TAKE 1 TABLET DAILY 90 tablet 3  . PARoxetine (PAXIL) 20 MG tablet Take 20 mg by mouth daily.    . potassium chloride SA (K-DUR,KLOR-CON) 20 MEQ tablet Take 20 mEq by mouth daily.    . SUPER B COMPLEX/C PO Take 1 tablet by mouth daily.     No current facility-administered medications for this visit.     REVIEW OF SYSTEMS:   [X]  denotes positive finding, [ ]  denotes negative finding Cardiac  Comments:  Chest pain or chest pressure:    Shortness of breath upon exertion:    Short of breath when lying flat:    Irregular heart rhythm:        Vascular    Pain in calf, thigh, or hip brought on by ambulation:    Pain in feet at night that wakes you up from your sleep:     Blood clot in your veins:    Leg swelling:         Pulmonary    Oxygen at home:    Productive cough:     Wheezing:         Neurologic    Sudden weakness in arms or legs:     Sudden numbness in arms or legs:     Sudden onset of difficulty speaking or slurred speech:    Temporary loss of vision in one eye:     Problems with dizziness:         Gastrointestinal    Blood in stool:     Vomited blood:         Genitourinary    Burning when urinating:     Blood in urine:        Psychiatric    Major depression:         Hematologic    Bleeding problems:    Problems with blood clotting too easily:  Skin    Rashes or ulcers:          Constitutional    Fever or chills:      PHYSICAL EXAM:   Vitals:   07/22/17 1459  BP: 117/73  Pulse: 64  SpO2: 97%  Weight: 154 lb 4.8 oz (70 kg)  Height: 5\' 3"  (1.6 m)    GENERAL: The patient is a well-nourished male, in no acute distress. The vital signs are documented above. CARDIAC: There is a regular rate and rhythm.  VASCULAR: non palpable pedal pulse on the left PULMONARY: Non-labored respirations MUSCULOSKELETAL: There are no major deformities or cyanosis. NEUROLOGIC: No focal weakness or paresthesias are detected. SKIN: There are no ulcers or rashes noted. PSYCHIATRIC: The patient has a normal affect.  STUDIES:   I have reviewed his vein mapping.  The left saphenous vein is over 0.4 cm throughout the left leg from the groin to the knee  MEDICAL ISSUES:   Left leg claudication: We discussed proceeding with left femoral to above-knee popliteal artery bypass graft with vein as well as left profunda femoral endarterectomy.  I discussed the risks and benefits of the operation including the hospital stay the risk of infection the risk of bypass graft failure.  I stressed the importance of smoking cessation for prolonged graft patency.  I will have him cleared by cardiology as well as neuroradiology prior to his operation.  I will need to stop his Plavix 1 week prior to his operation.  This is been scheduled for Thursday, September 13    Annamarie Major, MD [facility] [phone] Pager [phone]

## 2017-08-16 NOTE — Progress Notes (Signed)
Arrived to room 4E17 from PACU

## 2017-08-17 ENCOUNTER — Encounter (HOSPITAL_COMMUNITY): Payer: Self-pay | Admitting: Surgery

## 2017-08-17 LAB — BASIC METABOLIC PANEL
ANION GAP: 5 (ref 5–15)
BUN: 7 mg/dL (ref 6–20)
CO2: 28 mmol/L (ref 22–32)
CREATININE: 0.6 mg/dL — AB (ref 0.61–1.24)
Calcium: 8.2 mg/dL — ABNORMAL LOW (ref 8.9–10.3)
Chloride: 102 mmol/L (ref 101–111)
GFR calc non Af Amer: >60 mL/min (ref >60)
Glucose, Bld: 122 mg/dL — ABNORMAL HIGH (ref 65–99)
Potassium: 4.1 mmol/L (ref 3.5–5.1)
Sodium: 135 mmol/L (ref 135–145)

## 2017-08-17 LAB — CBC
HCT: 34.6 % — ABNORMAL LOW (ref 39.0–52.0)
Hemoglobin: 11.4 g/dL — ABNORMAL LOW (ref 13.0–17.0)
MCH: 30.2 pg (ref 26.0–34.0)
MCHC: 32.9 g/dL (ref 30.0–36.0)
MCV: 91.5 fL (ref 78.0–100.0)
PLATELETS: 226 10*3/uL (ref 150–400)
RBC: 3.78 MIL/uL — ABNORMAL LOW (ref 4.22–5.81)
RDW: 13.1 % (ref 11.5–15.5)
WBC: 10.2 10*3/uL (ref 4.0–10.5)

## 2017-08-17 NOTE — Evaluation (Signed)
Physical Therapy Evaluation Patient Details Name: Roy [name] MRN: [id number] DOB: 10/29/1946 Today's Date: 08/17/2017   History of Present Illness  Pt is 71 yo male who presents for L FPBG. PMH: PVD, cirrhosis, AICD, iliac stenting 2015, TIA, MI   Clinical Impression  Pt admitted with above diagnosis. Pt currently with functional limitations due to the deficits listed below (see PT Problem List). Pt moving well today, was able to ambulate 150' with RW without pain in his hips, in addition to practicing 4 steps with min-guard A.  Pt will benefit from skilled PT to increase their independence and safety with mobility to allow discharge to the venue listed below.       Follow Up Recommendations No PT follow up    Equipment Recommendations  None recommended by PT    Recommendations for Other Services       Precautions / Restrictions Precautions Precautions: None Restrictions Weight Bearing Restrictions: No      Mobility  Bed Mobility Overal bed mobility: Needs Assistance Bed Mobility: Supine to Sit     Supine to sit: Supervision     General bed mobility comments: pt used rail and had minor difficulty getting self balanced EOB  Transfers Overall transfer level: Needs assistance Equipment used: Rolling walker (2 wheeled) Transfers: Sit to/from Stand Sit to Stand: Min guard         General transfer comment: min-guard for safety but pt was able to put wt through LLE and place hands on RW forstability  Ambulation/Gait Ambulation/Gait assistance: Min guard Ambulation Distance (Feet): 150 Feet Assistive device: Rolling walker (2 wheeled) Gait Pattern/deviations: Step-through pattern;Decreased stride length;Decreased weight shift to left Gait velocity: decreased Gait velocity interpretation: Below normal speed for age/gender General Gait Details: pt able to get heel to floor but quick step on L due to decreased L df and reliant on RW for pain  control  Stairs Stairs: Yes Stairs assistance: Min guard Stair Management: Two rails;Step to pattern;Forwards Number of Stairs: 4 General stair comments: vc's for sequencing  Wheelchair Mobility    Modified Rankin (Stroke Patients Only)       Balance Overall balance assessment: Needs assistance Sitting-balance support: No upper extremity supported Sitting balance-Leahy Scale: Fair     Standing balance support: No upper extremity supported Standing balance-Leahy Scale: Fair                               Pertinent Vitals/Pain Pain Assessment: Faces Faces Pain Scale: Hurts little more Pain Location: LLE Pain Descriptors / Indicators: Burning Pain Intervention(s): Limited activity within patient's tolerance;Monitored during session    [place] expects to be discharged to:: Private residence Living Arrangements: Spouse/significant other Available Help at Discharge: Family;Available 24 hours/day Type of Home: Mobile home Home Access: Stairs to enter;Ramped entrance Entrance Stairs-Rails: Right Entrance Stairs-Number of Steps: 4 Home Layout: One level Home Equipment: Walker - 2 wheels;Cane - single point      Prior Function Level of Independence: Independent               Hand Dominance        Extremity/Trunk Assessment   Upper Extremity Assessment Upper Extremity Assessment: Overall WFL for tasks assessed    Lower Extremity Assessment Lower Extremity Assessment: LLE deficits/detail LLE Deficits / Details: burning on top pf foot, hip flex >3/5, knee and ankle tightness noted LLE Sensation: decreased light touch    Cervical / Trunk Assessment  Cervical / Trunk Assessment: Normal  Communication   Communication: No difficulties  Cognition Arousal/Alertness: Awake/alert Behavior During Therapy: WFL for tasks assessed/performed Overall Cognitive Status: Within Functional Limits for tasks assessed                                         General Comments General comments (skin integrity, edema, etc.): VSS throughout ambulation, HR 86 bpm O2 sats 96% after stairs    Exercises General Exercises - Lower Extremity Ankle Circles/Pumps: AROM;Left;10 reps;Seated Heel Slides: AROM;Left;10 reps;Seated   Assessment/Plan    PT Assessment Patient needs continued PT services  PT Problem List Decreased range of motion;Decreased activity tolerance;Decreased balance;Decreased mobility;Decreased knowledge of use of DME;Decreased knowledge of precautions;Pain;Cardiopulmonary status limiting activity;Impaired sensation       PT Treatment Interventions DME instruction;Gait training;Stair training;Functional mobility training;Therapeutic activities;Therapeutic exercise;Balance training;Patient/family education    PT Goals (Current goals can be found in the Care Plan section)  Acute Rehab PT Goals Patient Stated Goal: return home PT Goal Formulation: With patient Time For Goal Achievement: 08/31/17 Potential to Achieve Goals: Good    Frequency Min 3X/week   Barriers to discharge        Co-evaluation               AM-PAC PT "6 Clicks" Daily Activity  Outcome Measure Difficulty turning over in bed (including adjusting bedclothes, sheets and blankets)?: None Difficulty moving from lying on back to sitting on the side of the bed? : A Little Difficulty sitting down on and standing up from a chair with arms (e.g., wheelchair, bedside commode, etc,.)?: A Little Help needed moving to and from a bed to chair (including a wheelchair)?: A Little Help needed walking in hospital room?: A Little Help needed climbing 3-5 steps with a railing? : A Little 6 Click Score: 19    End of Session Equipment Utilized During Treatment: Gait belt Activity Tolerance: Patient tolerated treatment well Patient left: in chair;with call bell/phone within reach;with family/visitor present Nurse Communication: Mobility  status PT Visit Diagnosis: Other abnormalities of gait and mobility (R26.89);Pain Pain - Right/Left: Left Pain - part of body: Leg    Time: 0837-0900 PT Time Calculation (min) (ACUTE ONLY): 23 min   Charges:   PT Evaluation $PT Eval Moderate Complexity: 1 Mod PT Treatments $Gait Training: 8-22 mins   PT G Codes:        [place] 08/17/2017, 1:58 PM

## 2017-08-17 NOTE — Progress Notes (Signed)
A line remove per order. Pressure held. Pt tolerated well. Will continue to monitor. Isac Caddy, RN

## 2017-08-17 NOTE — Progress Notes (Signed)
  Progress Note    08/17/2017 10:36 AM 1 Day Post-Op  Subjective:  Says he feels great and has already worked with PT; c/o some burning on the top of his foot.  Afebrile HR 60's-70's  353'G-992'E systolic 26% 8TM1DQ  Vitals:   08/17/17 0003 08/17/17 0429  BP: 104/69 131/63  Pulse: 65   Resp: 14   Temp: 97.6 F (36.4 C) (!) 97.5 F (36.4 C)  SpO2: 96%     Physical Exam: Cardiac:  regular Lungs:  Non labored  Incisions:  Clean and dry Extremities:  Left foot is warm with palpable DP pulse   CBC    Component Value Date/Time   WBC 10.2 08/17/2017 0355   RBC 3.78 (L) 08/17/2017 0355   HGB 11.4 (L) 08/17/2017 0355   HCT 34.6 (L) 08/17/2017 0355   HCT 43 04/16/2012 0806   PLT 226 08/17/2017 0355   MCV 91.5 08/17/2017 0355   MCH 30.2 08/17/2017 0355   MCHC 32.9 08/17/2017 0355   RDW 13.1 08/17/2017 0355   LYMPHSABS 1,530 02/13/2017 0859   MONOABS 935 02/13/2017 0859   EOSABS 510 (H) 02/13/2017 0859   BASOSABS 0 02/13/2017 0859    BMET    Component Value Date/Time   NA 135 08/17/2017 0355   NA 135 (A) 04/16/2012 0807   K 4.1 08/17/2017 0355   K 4.2 04/16/2012 0807   CL 102 08/17/2017 0355   CL 99 04/16/2012 0807   CO2 28 08/17/2017 0355   CO2 26 04/16/2012 0807   GLUCOSE 122 (H) 08/17/2017 0355   BUN 7 08/17/2017 0355   BUN 6 04/16/2012 0807   CREATININE 0.60 (L) 08/17/2017 0355   CREATININE 0.65 (L) 02/13/2017 0859   CALCIUM 8.2 (L) 08/17/2017 0355   CALCIUM 9.0 04/16/2012 0807   GFRNONAA >60 08/17/2017 0355   GFRNONAA >89 03/03/2014 0957   GFRAA >60 08/17/2017 0355   GFRAA >89 03/03/2014 0957    INR    Component Value Date/Time   INR 1.00 08/13/2017 0929     Intake/Output Summary (Last 24 hours) at 08/17/17 1036 Last data filed at 08/17/17 0549  Gross per 24 hour  Intake          4346.25 ml  Output             2745 ml  Net          1601.25 ml     Assessment:  71 y.o. male is s/p:  #1: Left femoral to above-knee popliteal artery  bypass graft with ipsilateral non-reversed greater saphenous vein #2: Extensive left profunda femoral endarterectomy #3: Left external iliac artery endarterectomy  1 Day Post-Op  Plan: -pt doing well and has already ambulated in hallway and stairwell.  -change to telemetry status -if he has a good day today, most likely discharge in the next 1-2 days.  -DVT prophylaxis:  Lovenox to start this afternoon.   Leontine Locket, PA-C [facility] [phone] 08/17/2017 10:36 AM  Palpable DP pulse, POD#1.  Doing well with mobilization.  Possible d/c in am.   Annamarie Major

## 2017-08-18 LAB — POCT ACTIVATED CLOTTING TIME
ACTIVATED CLOTTING TIME: 296 s
ACTIVATED CLOTTING TIME: 224 s

## 2017-08-18 MED ORDER — OXYCODONE HCL 5 MG PO TABS: 5.0000 mg | ORAL_TABLET | Freq: Four times a day (QID) | ORAL | 0 refills | Status: DC | PRN

## 2017-08-18 NOTE — Discharge Instructions (Signed)
[facility]  Discharge instructions  Lower Extremity Bypass Surgery  Please refer to the following instruction for your post-procedure care. Your surgeon or physician assistant will discuss any changes with you.  Activity  You are encouraged to walk as much as you can. You can slowly return to normal activities during the month after your surgery. Avoid strenuous activity and heavy lifting until your doctor tells you it's OK. Avoid activities such as vacuuming or swinging a golf club. Do not drive until your doctor give the OK and you are no longer taking prescription pain medications. It is also normal to have difficulty with sleep habits, eating and bowel movement after surgery. These will go away with time.   Use your incentive spirometer several times a day to exercise your lungs to help prevent pneumonia.  Bathing/Showering  You may shower after you go home. Do not soak in a bathtub, hot tub, or swim until the incision heals completely.  Incision Care  Clean your incision with mild soap and water. Shower every day. Pat the area dry with a clean towel. You do not need a bandage unless otherwise instructed. Do not apply any ointments or creams to your incision. If you have open wounds you will be instructed how to care for them or a visiting nurse may be arranged for you. If you have staples or sutures along your incision they will be removed at your post-op appointment. You may have skin glue on your incision. Do not peel it off. It will come off on its own in about one week.  Wash the groin wound with soap and water daily and pat dry. (No tub bath-only shower)  Then put a dry gauze or washcloth in the groin to keep this area dry to help prevent wound infection.  Do this daily and as needed.  Do not use Vaseline or neosporin on your incisions.  Only use soap and water on your incisions and then protect and keep dry.  Diet  Resume your normal diet.  There are no special food restrictions following this procedure. A low fat/ low cholesterol diet is recommended for all patients with vascular disease. In order to heal from your surgery, it is CRITICAL to get adequate nutrition. Your body requires vitamins, minerals, and protein. Vegetables are the best source of vitamins and minerals. Vegetables also provide the perfect balance of protein. Processed food has little nutritional value, so try to avoid this.  Medications  Resume taking all your medications unless your doctor or nurse practitioner tells you not to. If your incision is causing pain, you may take over-the-counter pain relievers such as acetaminophen (Tylenol). If you were prescribed a stronger pain medication, please aware these medication can cause nausea and constipation. Prevent nausea by taking the medication with a snack or meal. Avoid constipation by drinking plenty of fluids and eating foods with high amount of fiber, such as fruits, vegetables, and grains. Take Colase 100 mg (an over-the-counter stool softener) twice a day as needed for constipation. Do not take Tylenol if you are taking prescription pain medications.  Follow Up  Our office will schedule a follow up appointment 2-3 weeks following discharge.  Please call us immediately for any of the following conditions  Severe or worsening pain in your legs or feet while at rest or while walking Increase pain, redness, warmth, or drainage (pus) from your incision site(s) Fever of 101 degree or higher The swelling in your leg with  the bypass suddenly worsens and becomes more painful than when you were in the hospital If you have been instructed to feel your graft pulse then you should do so every day. If you can no longer feel this pulse, call the office immediately. Not all patients are given this instruction.  Leg swelling is common after leg bypass surgery.  The swelling should improve over a few months following  surgery. To improve the swelling, you may elevate your legs above the level of your heart while you are sitting or resting. Your surgeon or physician assistant may ask you to apply an ACE wrap or wear compression (TED) stockings to help to reduce swelling.  Reduce your risk of vascular disease  Stop smoking. If you would like help call [facility] at 1-800-QUIT-NOW 469-079-3635) or [place] at [phone].  Manage your cholesterol Maintain a desired weight Control your diabetes weight Control your diabetes Keep your blood pressure down  If you have any questions, please call the office at [phone]

## 2017-08-18 NOTE — Progress Notes (Signed)
Roy [name] to be D/C'd Home per MD order. Discussed with the patient and all questions fully answered.    VVS, Skin clean, dry and intact without evidence of skin break down, no evidence of skin tears noted.  IV catheter discontinued intact. Site without signs and symptoms of complications. Dressing and pressure applied.  An After Visit Summary was printed and given to the patient.  Patient escorted via [place], and D/C home via private auto.  [place] Numbers  08/18/2017 10:13 AM

## 2017-08-18 NOTE — Evaluation (Signed)
Occupational Therapy Evaluation and Discharge Patient Details Name: Roy [name] MRN: [id number] DOB: [birth year] Today's Date: 08/18/2017    History of Present Illness Pt is 71 yo male who presents for L FPBG. PMH: PVD, cirrhosis, AICD, iliac stenting 2015, TIA, MI    Clinical Impression   PTA Pt independent in ADL and mobility. Pt is currently mod I/supervision for AD with the exception of LB dressing, and is supervision for ambulation with the RW in the room. Pt and wife educated on compensatory strategies for ADL and functional transfers (Pt has appropriate DME at home) and safety with multiple toy poodles once he gets home. Pt and wife's questions and concerns answered pertaining to OT. Education complete. OT to sign off. Thank you for the chance to serve this patient.    Follow Up Recommendations  No OT follow up;Supervision/Assistance - 24 hour    Equipment Recommendations  None recommended by OT    Recommendations for Other Services       Precautions / Restrictions Precautions Precautions: None Restrictions Weight Bearing Restrictions: No      Mobility Bed Mobility Overal bed mobility: Needs Assistance Bed Mobility: Supine to Sit     Supine to sit: Supervision     General bed mobility comments: use of rail to assist, HOB elevated  Transfers Overall transfer level: Needs assistance Equipment used: Rolling walker (2 wheeled) Transfers: Sit to/from Stand Sit to Stand: Min guard         General transfer comment: min-guard for safety but pt was able to put wt through LLE and place hands on RW forstability    Balance Overall balance assessment: Needs assistance Sitting-balance support: No upper extremity supported Sitting balance-Leahy Scale: Fair Sitting balance - Comments: able to sit EOB for LB dressing   Standing balance support: No upper extremity supported Standing balance-Leahy Scale: Fair Standing balance comment: able to perform static standing  at sink with no LOB                           ADL either performed or assessed with clinical judgement   ADL Overall ADL's : Needs assistance/impaired Eating/Feeding: Modified independent   Grooming: Wash/dry face;Wash/dry hands;Supervision/safety;Standing Grooming Details (indicate cue type and reason): sink level Upper Body Bathing: Modified independent;Sitting   Lower Body Bathing: Modified independent;Sitting/lateral leans Lower Body Bathing Details (indicate cue type and reason): safety for sitting Upper Body Dressing : Modified independent   Lower Body Dressing: Moderate assistance;With caregiver independent assisting;Sit to/from stand   Toilet Transfer: Supervision/safety;Ambulation;RW;Regular Toilet;Grab bars   Toileting- Clothing Manipulation and Hygiene: Modified independent;Sit to/from stand Toileting - Clothing Manipulation Details (indicate cue type and reason): use of RW for balance     Functional mobility during ADLs: Supervision/safety;Rolling walker General ADL Comments: Pt and wife feel very confident with their home set up and going home     Vision Baseline Vision/History: Wears glasses Wears Glasses: Reading only Patient Visual Report: No change from baseline       Perception     Praxis      Pertinent Vitals/Pain Pain Assessment: 0-10 Pain Score: 4  Pain Location: LLE - top of foot Pain Descriptors / Indicators: Burning Pain Intervention(s): Monitored during session;Repositioned     Hand Dominance Right   Extremity/Trunk Assessment Upper Extremity Assessment Upper Extremity Assessment: Overall WFL for tasks assessed   Lower Extremity Assessment Lower Extremity Assessment: Defer to PT evaluation   Cervical / Trunk Assessment Cervical /  Trunk Assessment: Normal   Communication Communication Communication: No difficulties   Cognition Arousal/Alertness: Awake/alert Behavior During Therapy: WFL for tasks  assessed/performed Overall Cognitive Status: Within Functional Limits for tasks assessed                                     General Comments       Exercises     Shoulder Instructions      Home Living Family/patient expects to be discharged to:: Private residence Living Arrangements: Spouse/significant other Available Help at Discharge: Family;Available 24 hours/day Type of Home: Mobile home Home Access: Stairs to enter;Ramped entrance Entrance Stairs-Number of Steps: 4 Entrance Stairs-Rails: Right Home Layout: One level     Bathroom Shower/Tub: Teacher, early years/pre: Handicapped height     Home Equipment: Environmental consultant - 2 wheels;Cane - single point;Shower seat;Grab bars - toilet;Grab bars - tub/shower;Hand held shower head   Additional Comments: has 6 toy poodles      Prior Functioning/Environment Level of Independence: Independent                 OT Problem List: Decreased activity tolerance;Impaired balance (sitting and/or standing);Pain      OT Treatment/Interventions:      OT Goals(Current goals can be found in the care plan section) Acute Rehab OT Goals Patient Stated Goal: return home OT Goal Formulation: With patient/family Time For Goal Achievement: 08/25/17 Potential to Achieve Goals: Good  OT Frequency:     Barriers to D/C:            Co-evaluation              AM-PAC PT "6 Clicks" Daily Activity     Outcome Measure Help from another person eating meals?: None Help from another person taking care of personal grooming?: None Help from another person toileting, which includes using toliet, bedpan, or urinal?: None Help from another person bathing (including washing, rinsing, drying)?: None Help from another person to put on and taking off regular upper body clothing?: None Help from another person to put on and taking off regular lower body clothing?: A Little 6 Click Score: 23   End of Session Equipment  Utilized During Treatment: Rolling walker Nurse Communication: Mobility status  Activity Tolerance: Patient tolerated treatment well Patient left: in bed;with call bell/phone within reach;with family/visitor present  OT Visit Diagnosis: Other abnormalities of gait and mobility (R26.89);Pain Pain - Right/Left: Left Pain - part of body: Leg                Time: 1610-9604 OT Time Calculation (min): 25 min Charges:  OT General Charges $OT Visit: 1 Visit OT Evaluation $OT Eval Moderate Complexity: 1 Mod OT Treatments $Self Care/Home Management : 8-22 mins G-Codes:     Hulda Humphrey OTR/L [place] 08/18/2017, 11:11 AM

## 2017-08-18 NOTE — Progress Notes (Signed)
  Progress Note    08/18/2017 8:21 AM 2 Days Post-Op  Subjective:  Ready to go home  Tm 99.3  Vitals:   08/17/17 2138 08/18/17 0554  BP: 140/85 (!) 146/62  Pulse: 79 77  Resp:  16  Temp:  99.3 F (37.4 C)  SpO2:  97%    Physical Exam: Cardiac:  regular Lungs:  Non labored Incisions:  Clean and dry Extremities:  +palpable left DP pulse   CBC    Component Value Date/Time   WBC 10.2 08/17/2017 0355   RBC 3.78 (L) 08/17/2017 0355   HGB 11.4 (L) 08/17/2017 0355   HCT 34.6 (L) 08/17/2017 0355   HCT 43 04/16/2012 0806   PLT 226 08/17/2017 0355   MCV 91.5 08/17/2017 0355   MCH 30.2 08/17/2017 0355   MCHC 32.9 08/17/2017 0355   RDW 13.1 08/17/2017 0355   LYMPHSABS 1,530 02/13/2017 0859   MONOABS 935 02/13/2017 0859   EOSABS 510 (H) 02/13/2017 0859   BASOSABS 0 02/13/2017 0859    BMET    Component Value Date/Time   NA 135 08/17/2017 0355   NA 135 (A) 04/16/2012 0807   K 4.1 08/17/2017 0355   K 4.2 04/16/2012 0807   CL 102 08/17/2017 0355   CL 99 04/16/2012 0807   CO2 28 08/17/2017 0355   CO2 26 04/16/2012 0807   GLUCOSE 122 (H) 08/17/2017 0355   BUN 7 08/17/2017 0355   BUN 6 04/16/2012 0807   CREATININE 0.60 (L) 08/17/2017 0355   CREATININE 0.65 (L) 02/13/2017 0859   CALCIUM 8.2 (L) 08/17/2017 0355   CALCIUM 9.0 04/16/2012 0807   GFRNONAA >60 08/17/2017 0355   GFRNONAA >89 03/03/2014 0957   GFRAA >60 08/17/2017 0355   GFRAA >89 03/03/2014 0957    INR    Component Value Date/Time   INR 1.00 08/13/2017 0929     Intake/Output Summary (Last 24 hours) at 08/18/17 0821 Last data filed at 08/18/17 0630  Gross per 24 hour  Intake             1560 ml  Output              925 ml  Net              635 ml     Assessment:  71 y.o. male is s/p:  #1: Left femoral to above-knee popliteal artery bypass graft with ipsilateral non-reversed greater saphenous vein #2: Extensive left profunda femoral endarterectomy #3: Left external iliac artery  endarterectomy   2 Days Post-Op  Plan: -pt doing well this am with palpable left DP pulse -incisions look good -pt has ambulated and pain is well controlled.  Will dc home today.   -discussed groin wound care with pt and wife. -low grade fever-IS to take home    Leontine Locket, PA-C [facility] [phone] 08/18/2017 8:21 AM   I agree with the above.  I have seen and evaluated the patient.  He is postoperative day 2 from femoral-popliteal bypass graft.  He has palpable pedal pulse.  He is stable for discharge  Annamarie Major

## 2017-08-19 ENCOUNTER — Ambulatory Visit (INDEPENDENT_AMBULATORY_CARE_PROVIDER_SITE_OTHER): Payer: Medicare Other | Admitting: Nurse Practitioner

## 2017-08-19 ENCOUNTER — Encounter: Payer: Self-pay | Admitting: Nurse Practitioner

## 2017-08-19 ENCOUNTER — Telehealth: Payer: Self-pay | Admitting: Surgery

## 2017-08-19 VITALS — BP 108/66 | HR 76 | Temp 97.0°F | Ht 63.0 in | Wt 157.8 lb

## 2017-08-19 DIAGNOSIS — I110 Hypertensive heart disease with heart failure: Secondary | ICD-10-CM | POA: Diagnosis not present

## 2017-08-19 DIAGNOSIS — F1011 Alcohol abuse, in remission: Secondary | ICD-10-CM | POA: Insufficient documentation

## 2017-08-19 DIAGNOSIS — F17210 Nicotine dependence, cigarettes, uncomplicated: Secondary | ICD-10-CM | POA: Diagnosis not present

## 2017-08-19 DIAGNOSIS — J449 Chronic obstructive pulmonary disease, unspecified: Secondary | ICD-10-CM | POA: Diagnosis not present

## 2017-08-19 DIAGNOSIS — I255 Ischemic cardiomyopathy: Secondary | ICD-10-CM

## 2017-08-19 DIAGNOSIS — Z87898 Personal history of other specified conditions: Secondary | ICD-10-CM | POA: Diagnosis not present

## 2017-08-19 DIAGNOSIS — I5022 Chronic systolic (congestive) heart failure: Secondary | ICD-10-CM | POA: Diagnosis not present

## 2017-08-19 DIAGNOSIS — K7030 Alcoholic cirrhosis of liver without ascites: Secondary | ICD-10-CM | POA: Diagnosis not present

## 2017-08-19 DIAGNOSIS — M6281 Muscle weakness (generalized): Secondary | ICD-10-CM | POA: Diagnosis not present

## 2017-08-19 DIAGNOSIS — Z48812 Encounter for surgical aftercare following surgery on the circulatory system: Secondary | ICD-10-CM | POA: Diagnosis not present

## 2017-08-19 NOTE — Telephone Encounter (Signed)
-----   Message from Mena Goes, RN sent at 08/18/2017  8:19 PM EDT ----- Regarding: 2-3 weeks with ABIs if he didn't have in hospital   ----- Message ----- From: Gabriel Earing, PA-C Sent: 08/18/2017   8:27 AM To: Vvs Charge Pool  S/p left fem pop bypass.  F/u with Dr. [name] in 2-3 weeks with ABI's if he did not have them post op.  Thanks [employer]

## 2017-08-19 NOTE — Assessment & Plan Note (Signed)
History of alcohol abuse. States he quit drinking in 2015 after his heart attack. No alcohol since 2015. Commended him on his giving up alcohol and recommended he continue to do so. Continue to monitor.

## 2017-08-19 NOTE — Patient Instructions (Signed)
1. Have your labs and ultrasound completed when you're able to. 2. Continue to avoid alcohol. 3. Return for follow-up in 6 months. 4. Call us if you have any questions or concerns. 5. Good luck with your recovery from your femoropopliteal bypass!

## 2017-08-19 NOTE — Telephone Encounter (Signed)
Sched lab 09/20/17 at 2:00 and MD 09/23/17 at 2:00. Spoke to pt.

## 2017-08-19 NOTE — Progress Notes (Signed)
Referring Provider: Celene Squibb, MD Primary Care Physician:  Celene Squibb, MD Primary GI:  Dr. Oneida Alar  Chief Complaint  Patient presents with  . Cirrhosis    f/u    HPI:   Roy [name] is a 71 y.o. male who presents For routine follow-up on cirrhosis. The patient was last seen in our office 81/82/9937 for alcoholic cirrhosis. At his last visit he was doing well, no hepatic or GI symptoms. He had not proceed with his previously ordered abdominal ultrasound because she said nobody ever called to schedule it. Denied any going EtOH use. Recommended routine labs including CBC, CMP, AFP, PT/INR as well as right upper quadrant ultrasound. Follow-up in 6 months.  Most recent labs reviewed. Last liver labs completed 08/13/2017 which found mild leukocytosis at 13.8, normal platelet count of 243, mildly low sodium, other liver functions normal, INR 1.0. Calculated meld score is 6, Child-Pugh A.  Reviewed right upper quadrant liver ultrasound. Imaging completed 02/15/2017 which found no focal mass, no significant interval change noted.  EGD 2015 report reviewed, no varices or portal gastropathy noted.  Today he states he's doing well. Just got out of the hospital yesterday after fem-pop bypass. Denies abdominal pain, N/V, hematochezia. Has dark stools on iron, but not "sticky." Denies acute changes in bowel movements, fever, chills, unintentional weight loss, yellowing of skin/eyes, darkened urine, LE edema, acute episodic confusion, flapping tremors. Denies chest pain, dyspnea, dizziness, lightheadedness, syncope, near syncope. Denies any other upper or lower GI symptoms.  Past Medical History:  Diagnosis Date  . AICD (automatic cardioverter/defibrillator) present 08/19/2015   [employer] Jude BiV ICD for primary prevention by Dr. Lovena Le  . Alcohol abuse    6 beers per day; hospital admission in 2009 for withdrawal symptoms  . Alcoholic cirrhosis ([place])   . Anxiety and depression   . Cerebrovascular  disease 2009   TIA; 2009- right ICA stent; re-intervention for restenosis complicated by [place] w/o sx  . CHF (congestive heart failure) ([place]) 06-02-14  . Chronic obstructive pulmonary disease ([place])   . Degenerative joint disease    Total shoulder arthroplasty-right  . Hyperlipidemia   . Hypertension   . LBBB (left bundle branch block)    Normal echo-2011; stress nuclear in 09/2010--septal hypoperfusion representing nontransmural infarction or the effect of left bundle branch block, no ischemia  . Myocardial infarction [facility]) June 02, 2014   Massive Heart Attack  . Peripheral vascular disease ([place])   . 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  . Thrombocytopenia ([place])   . Tobacco abuse    -100 pack years; 1.5 packs per day  . Tubular adenoma of colon     Past Surgical History:  Procedure Laterality Date  . ABDOMINAL AORTAGRAM [name] 05/04/2014   Procedure: ABDOMINAL Maxcine Ham;  Surgeon: [name], MD;  Location: [facility] CATH LAB;  Service: Cardiovascular;  Laterality: [name];  . ABDOMINAL AORTOGRAM [name] 06/25/2017   Procedure: Abdominal Aortogram;  Surgeon: [name], MD;  Location: [place] CV LAB;  Service: Cardiovascular;  Laterality: [name];  . AGILE CAPSULE [name] 03/18/2014   Procedure: AGILE CAPSULE;  Surgeon: Danie Binder, MD;  Location: [facility] ENDO SU[facility];  Service: Endoscopy;  Laterality: [name];  7:30  . BACK SURGERY    . BACTERIAL OVERGROWTH TEST [name] 05/24/2015   Procedure: BACTERIAL OVERGROWTH TEST;  Surgeon: Danie Binder, MD;  [facility]: [facility];  Service: Endoscopy;  Laterality: [name];  0700  . BI-VENTRICULAR IMPLANTABLE CARDIOVERTER DEFIBRILLATOR  (  CRT-D)  08/19/2015  . CARPAL TUNNEL RELEASE Left 02/02/2016   Procedure: LEFT CARPAL TUNNEL RELEASE;  Surgeon: Daryll Brod, MD;  Location: [place];  Service: Orthopedics;  Laterality: Left;  ANESTHESIA: IV REGIONAL UPPER ARM  . CATARACT EXTRACTION W/PHACO Right 03/08/2015   Procedure: CATARACT EXTRACTION PHACO AND  INTRAOCULAR LENS PLACEMENT (IOC);  Surgeon: Rutherford Guys, MD;  Location: [facility] ORS;  Service: Ophthalmology;  Laterality: Right;  CDE:9.46  . CATARACT EXTRACTION W/PHACO Left 03/22/2015   Procedure: CATARACT EXTRACTION PHACO AND INTRAOCULAR LENS PLACEMENT (IOC);  Surgeon: Rutherford Guys, MD;  Location: [facility];  Service: Ophthalmology;  Laterality: Left;  CDE:5.80  . COLONOSCOPY  08/22/09   Fields-(Tubular Adenoma)3-mm transverse polyp/4-mm polyp otherwise noraml/small internal hemorrhoids  . COLONOSCOPY [name] 04/14/2014   MKL:[id number] internal hemorrhids/normal mocsa in the terminal iluem/left colonis redundant  . COLONOSCOPY W/ POLYPECTOMY  2011  . ENDARTERECTOMY FEMORAL Left 08/16/2017   Procedure: ENDARTERECTOMY LEFT PROFUNDA FEMORAL;  Surgeon: [name], MD;  Location: [place];  Service: Vascular;  Laterality: Left;  . EP IMPLANTABLE DEVICE [name] 08/19/2015   Procedure: BiV ICD Insertion CRT-D;  Surgeon: Evans Lance, MD;  Location: [place] CV LAB;  Service: Cardiovascular;  Laterality: [name];  . ESOPHAGOGASTRODUODENOSCOPY [name] 03/05/2014   SLF: 1. Stricture at the gastroesophagael junction 2. Small hiatal hernia 3. Moderate non-erosive gastritis and duodentitis. 4. No surce for Melena identified.   . FEMORAL-POPLITEAL BYPASS GRAFT Left 08/16/2017   Procedure: LEFT  FEMORAL-POPLITEAL ARTERY  BYPASS GRAFT;  Surgeon: [name], MD;  Location: [place];  Service: Vascular;  Laterality: Left;  . GIVENS CAPSULE STUDY [name] 03/30/2014   Procedure: GIVENS CAPSULE STUDY;  Surgeon: Danie Binder, MD;  [facility];  Service: Endoscopy;  Laterality: [name];  7:30  . IR ANGIO INTRA EXTRACRAN SEL COM CAROTID INNOMINATE BILAT MOD SED  07/16/2017  . IR ANGIO VERTEBRAL SEL SUBCLAVIAN INNOMINATE BILAT MOD SED  07/16/2017  . IR RADIOLOGIST EVAL & MGMT  04/01/2017  . IR RADIOLOGIST EVAL & MGMT  08/02/2017  . JOINT REPLACEMENT Right   . LOWER EXTREMITY ANGIOGRAPHY Bilateral 06/25/2017   Procedure: Lower Extremity  Angiography;  Surgeon: [name], MD;  Location: [place] CV LAB;  Service: Cardiovascular;  Laterality: Bilateral;  . LUMBAR FUSION  2010  . TOTAL SHOULDER ARTHROPLASTY Right 2011   Dr. Tamera Punt  . ULNAR NERVE TRANSPOSITION Left 02/02/2016   Procedure: LEFT IN-SITU DECOMPRESSION ULNAR NERVE ;  Surgeon: Daryll Brod, MD;  Location: [place];  Service: Orthopedics;  Laterality: Left;  . ULNAR TUNNEL RELEASE Left 02/02/2016   Procedure: LEFT CUBITAL TUNNEL RELEASE;  Surgeon: Daryll Brod, MD;  Location: [place];  Service: Orthopedics;  Laterality: Left;  [name] VASECTOMY  1971  . VEIN HARVEST Left 08/16/2017   Procedure: USING NON REVERSE LEFT GREATER SAPHENOUS VEIN HARVEST;  Surgeon: [name], MD;  Location: [facility];  Service: Vascular;  Laterality: Left;    Current Outpatient Prescriptions  Medication Sig Dispense Refill  . acetaminophen (TYLENOL) 500 MG tablet Take 500 mg by mouth daily as needed for moderate pain.    [name] aspirin EC 81 MG tablet Take 81 mg by mouth every evening.     [name] atorvastatin (LIPITOR) 20 MG tablet Take 20 mg by mouth daily.     . carvedilol (COREG) 3.125 MG tablet Take 1 tablet (3.125 mg total) by mouth 2 (two) times daily. 180 tablet 3  . ciprofloxacin (  CIPRO) 500 MG tablet Take 1 tablet (500 mg total) by mouth 2 (two) times daily. 20 tablet 0  . clopidogrel (PLAVIX) 75 MG tablet Take 75 mg by mouth every evening.     [name] dextromethorphan-guaiFENesin (MUCINEX DM) 30-600 MG 12hr tablet Take 1 tablet by mouth daily.    [name] ENTRESTO 49-51 MG TAKE 1 TABLET TWICE A DAY 180 tablet 3  . Flax Oil-Fish Oil-Borage Oil (FISH OIL-FLAX OIL-BORAGE OIL) CAPS Take 1 capsule by mouth daily.    . folic acid (FOLVITE) 903 MCG tablet Take 800 mcg by mouth daily.    [name] gabapentin (NEURONTIN) 300 MG capsule Take 300 mg by mouth 2 (two) times daily.     . Iron-FA-B Cmp-C-Biot-Probiotic (FUSION PLUS) CAPS Take 1 capsule by mouth daily.     . Multiple  Vitamins-Minerals (CENTRUM SILVER PO) Take 1 tablet by mouth daily.     [name] oxyCODONE (ROXICODONE) 5 MG immediate release tablet Take 1 tablet (5 mg total) by mouth every 6 (six) hours as needed for severe pain. 20 tablet 0  . pantoprazole (PROTONIX) 40 MG tablet TAKE 1 TABLET DAILY 90 tablet 3  . PARoxetine (PAXIL) 20 MG tablet Take 20 mg by mouth daily.    . potassium chloride SA (K-DUR,KLOR-CON) 20 MEQ tablet Take 20 mEq by mouth daily.    . SUPER B COMPLEX/C PO Take 1 tablet by mouth daily.    . tamsulosin (FLOMAX) 0.4 MG CAPS capsule Take 0.4 mg by mouth daily.     No current facility-administered medications for this visit.     Allergies as of 08/19/2017  . (No Known Allergies)    Family History  Problem Relation Age of Onset  . Coronary artery disease Mother   . Diabetes Mother   . Heart disease Mother        Before age 56 - 49 Bypasses  . Hypertension Mother   . Heart attack Mother        3-4 Heart attacks  . Alzheimer's disease Father   . Diabetes Father   . Heart attack Sister   . Cancer Brother        Prostate  . Hyperlipidemia Son   . Prostate cancer Brother   . Alzheimer's disease Sister   . Colon cancer Neg Hx     Social History   Social History  . Marital status: Married    Spouse name: [name]  . Number of children: 2  . Years of education: [name]   Occupational History  . 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  Retired   Social History Main Topics  . Smoking status: Current Every Day Smoker    Packs/day: 1.00    Years: 60.00    Types: Cigarettes    Start date: 08/20/1957  . Smokeless tobacco: Never Used  . Alcohol use No     Comment: quit in July 2015; No ETOH to date 02/14/15; no ETOH to date 08/13/17  . Drug use: No  . Sexual activity: No   Other Topics Concern  . None   Social History Narrative   Accompanied by daughter [name]   Lives w/ wife, daughter    Review of Systems: Complete ROS negative except as per HPI.   Physical  Exam: BP 108/66   Pulse 76   Temp (!) 97 F (36.1 C) (Oral)   Ht 5\' 3"  (1.6 m)   Wt 157 lb 12.8 oz (71.6 kg)   BMI 27.95 kg/m  General:   Alert and oriented. Pleasant and cooperative. Well-nourished and well-developed.  Eyes:  Without icterus, sclera clear and conjunctiva pink.  Ears:  Normal auditory acuity. Cardiovascular:  S1, S2 present without murmurs appreciated. Extremities without clubbing or edema. Respiratory:  Clear to auscultation bilaterally. No wheezes, rales, or rhonchi. No distress.  Gastrointestinal:  +BS, soft, non-tender and non-distended. No HSM noted. No guarding or rebound. No masses appreciated.  Rectal:  Deferred  Musculoskalatal:  Symmetrical without gross deformities. Limping posture noted. Skin:  Intact without significant lesions or rashes. Neurologic:  Alert and oriented x4;  grossly normal neurologically. Heme/Lymph/Immune: No significant cervical adenopathy. No excessive bruising noted.    08/19/2017 8:41 AM   Disclaimer: This note was dictated with voice recognition software. Similar sounding words can inadvertently be transcribed and may not be corrected upon review.

## 2017-08-19 NOTE — Progress Notes (Signed)
CC'D TO PCP °

## 2017-08-19 NOTE — Assessment & Plan Note (Addendum)
History of alcoholic cirrhosis. Last liver labs indicated a meld score of 6. Right upper quadrant ultrasound stable/no hepatoma. We will repeat his routine labs and ultrasound. He is generally asymptomatic from a GI and hepatic standpoint today. Last upper endoscopy completed 2015 with no noted varices. Return for follow-up in 6 months.

## 2017-08-20 LAB — CBC WITH DIFFERENTIAL/PLATELET
Basophils Absolute: 32 cells/uL (ref 0–200)
Basophils Relative: 0.3 %
Eosinophils Absolute: 307 cells/uL (ref 15–500)
Eosinophils Relative: 2.9 %
HEMATOCRIT: 35.6 % — AB (ref 38.5–50.0)
HEMOGLOBIN: 12.4 g/dL — AB (ref 13.2–17.1)
Lymphs Abs: 1579 cells/uL (ref 850–3900)
MCH: 30.9 pg (ref 27.0–33.0)
MCHC: 34.8 g/dL (ref 32.0–36.0)
MCV: 88.8 fL (ref 80.0–100.0)
MONOS PCT: 11.5 %
MPV: 11.7 fL (ref 7.5–12.5)
NEUTROS PCT: 70.4 %
Neutro Abs: 7462 cells/uL (ref 1500–7800)
Platelets: 267 10*3/uL (ref 140–400)
RBC: 4.01 10*6/uL — ABNORMAL LOW (ref 4.20–5.80)
RDW: 12.4 % (ref 11.0–15.0)
TOTAL LYMPHOCYTE: 14.9 %
WBC: 10.6 10*3/uL (ref 3.8–10.8)
WBCMIX: 1219 {cells}/uL — AB (ref 200–950)

## 2017-08-20 LAB — COMPREHENSIVE METABOLIC PANEL
AG RATIO: 1.4 (calc) (ref 1.0–2.5)
ALT: 7 U/L — AB (ref 9–46)
AST: 12 U/L (ref 10–35)
Albumin: 3.7 g/dL (ref 3.6–5.1)
Alkaline phosphatase (APISO): 74 U/L (ref 40–115)
BILIRUBIN TOTAL: 0.6 mg/dL (ref 0.2–1.2)
BUN/Creatinine Ratio: 13 (calc) (ref 6–22)
BUN: 7 mg/dL (ref 7–25)
CALCIUM: 8.7 mg/dL (ref 8.6–10.3)
CO2: 30 mmol/L (ref 20–32)
Chloride: 97 mmol/L — ABNORMAL LOW (ref 98–110)
Creat: 0.56 mg/dL — ABNORMAL LOW (ref 0.70–1.18)
Globulin: 2.6 g/dL (calc) (ref 1.9–3.7)
Glucose, Bld: 111 mg/dL (ref 65–139)
Potassium: 4.1 mmol/L (ref 3.5–5.3)
SODIUM: 135 mmol/L (ref 135–146)
TOTAL PROTEIN: 6.3 g/dL (ref 6.1–8.1)

## 2017-08-20 LAB — PROTIME-INR
INR: 1.1
Prothrombin Time: 11.9 s — ABNORMAL HIGH (ref 9.0–11.5)

## 2017-08-20 LAB — AFP TUMOR MARKER: AFP-Tumor Marker: 0.9 ng/mL (ref <6.1)

## 2017-08-21 ENCOUNTER — Ambulatory Visit (HOSPITAL_COMMUNITY)
Admission: RE | Admit: 2017-08-21 | Discharge: 2017-08-21 | Disposition: A | Discharge status: Home / Self Care | Payer: Medicare Other | Source: Ambulatory Visit | Attending: Nurse Practitioner | Admitting: Nurse Practitioner

## 2017-08-21 DIAGNOSIS — I110 Hypertensive heart disease with heart failure: Secondary | ICD-10-CM | POA: Diagnosis not present

## 2017-08-21 DIAGNOSIS — Z87898 Personal history of other specified conditions: Secondary | ICD-10-CM | POA: Diagnosis not present

## 2017-08-21 DIAGNOSIS — F1011 Alcohol abuse, in remission: Secondary | ICD-10-CM

## 2017-08-21 DIAGNOSIS — J449 Chronic obstructive pulmonary disease, unspecified: Secondary | ICD-10-CM | POA: Diagnosis not present

## 2017-08-21 DIAGNOSIS — K7030 Alcoholic cirrhosis of liver without ascites: Secondary | ICD-10-CM | POA: Diagnosis not present

## 2017-08-21 DIAGNOSIS — Z48812 Encounter for surgical aftercare following surgery on the circulatory system: Secondary | ICD-10-CM | POA: Diagnosis not present

## 2017-08-21 DIAGNOSIS — I5022 Chronic systolic (congestive) heart failure: Secondary | ICD-10-CM | POA: Diagnosis not present

## 2017-08-21 DIAGNOSIS — M6281 Muscle weakness (generalized): Secondary | ICD-10-CM | POA: Diagnosis not present

## 2017-08-21 DIAGNOSIS — F17210 Nicotine dependence, cigarettes, uncomplicated: Secondary | ICD-10-CM | POA: Diagnosis not present

## 2017-08-22 NOTE — Discharge Summary (Signed)
Discharge Summary     Roy [name] [birth year] 71 y.o. male  [id number]  Admission Date: 08/16/2017  Discharge Date: 08/18/17  Physician: No att. providers found  Admission Diagnosis: peripheral vascular disease   HPI:   This is a 71 y.o. male who returns today for follow-up of his claudication symptoms. He is status post bilateral iliac stenting in 2015. This was done for lifestyle limiting claudication. An 8 x 40 self extending stent was placed on the left and 8 x 80 on the right. He had moderate left superficial femoral artery stenosis within the adductor canal and right common femoral artery stenosis. The patient had been doing well up until about 3 months ago. At that time he developed claudication symptoms at 100 feet that were alleviated with rest. He initially has pain in his calf but then this progresses to his hips.  The patient is unable to tolerate his level of disability once to proceed with revascularization.  His left leg bothers him more than the right.  He does not have any open wounds currently.  The patient continues to smoke. He is on aspirin and Plavix for a intracranial stent. He is medically managed for hypercholesterolemia with a statin. His blood pressure is medically managed.  Hospital Course:  The patient was admitted to the hospital and taken to the operating room on 08/16/2017 and underwent: #1: Left femoral to above-knee popliteal artery bypass graft with ipsilateral non-reversed greater saphenous vein #2: Extensive left profunda femoral endarterectomy #3: Left external iliac artery endarterectomy     Intraoperative findings:  Healthy-appearing above-knee popliteal artery.  The patient had extensive heavily calcified disease within the external iliac, common femoral, and profunda femoral artery.  I dissected down approximately 5 cm on the profundofemoral artery.  I used the vein as a patch for the arteriotomy.  The pt tolerated the  procedure well and was transported to the PACU in good condition.   By POD 1, the pt was doing well with palpable left DP pulse.  He had ambulated in the halls and stairwell.    By POD 2, he was doing well and was discharged home.  Groin wound care was discussed with he and his wife.    The remainder of the hospital course consisted of increasing mobilization and increasing intake of solids without difficulty.  CBC    Component Value Date/Time   WBC 10.6 08/19/2017 0939   RBC 4.01 (L) 08/19/2017 0939   HGB 12.4 (L) 08/19/2017 0939   HCT 35.6 (L) 08/19/2017 0939   HCT 43 04/16/2012 0806   PLT 267 08/19/2017 0939   MCV 88.8 08/19/2017 0939   MCH 30.9 08/19/2017 0939   MCHC 34.8 08/19/2017 0939   RDW 12.4 08/19/2017 0939   LYMPHSABS 1,579 08/19/2017 0939   MONOABS 935 02/13/2017 0859   EOSABS 307 08/19/2017 0939   BASOSABS 32 08/19/2017 0939    BMET    Component Value Date/Time   NA 135 08/19/2017 0939   NA 135 (A) 04/16/2012 0807   K 4.1 08/19/2017 0939   K 4.2 04/16/2012 0807   CL 97 (L) 08/19/2017 0939   CL 99 04/16/2012 0807   CO2 30 08/19/2017 0939   CO2 26 04/16/2012 0807   GLUCOSE 111 08/19/2017 0939   BUN 7 08/19/2017 0939   BUN 6 04/16/2012 0807   CREATININE 0.56 (L) 08/19/2017 0939   CALCIUM 8.7 08/19/2017 0939   CALCIUM 9.0 04/16/2012 0807   GFRNONAA >60 08/17/2017 0355  GFRNONAA >89 03/03/2014 0957   GFRAA >60 08/17/2017 0355   GFRAA >89 03/03/2014 0957     Discharge Instructions    Discharge patient    Complete by:  As directed    Discharge disposition:  01-Home or Self Care   Discharge patient date:  08/18/2017      Discharge Diagnosis:  peripheral vascular disease  Secondary Diagnosis: Patient Active Problem List   Diagnosis Date Noted  . History of alcohol abuse 08/19/2017  . PAD (peripheral artery disease) ([place]) 08/16/2017  . ICD (implantable cardioverter-defibrillator), biventricular, in situ 12/01/2015  . Cardiomyopathy, ischemic  08/20/2015  . Normocytic anemia 10/14/2014  . Weight gain 06/29/2014  . Hypokalemia 06/29/2014  . Chronic systolic heart failure ([place]) 06/15/2014  . COPD exacerbation ([place]) 06/15/2014  . Hypernatremia 06/15/2014  . Acute renal failure ([place]) 06/15/2014  . Encephalopathy 06/15/2014  . Loose stools 06/15/2014  . Respiratory failure ([place]) 06/03/2014  . Acute respiratory failure ([place]) 06/03/2014  . Pneumonia, organism unspecified(486) 06/03/2014  . Elevated troponin 06/03/2014  . NSTEMI (non-ST elevated myocardial infarction) ([place]) 06/03/2014  . Acute on chronic diastolic heart failure ([place]) 06/03/2014  . Peripheral vascular disease, unspecified ([place]) 04/26/2014  . Melena 03/03/2014  . Cirrhosis, alcoholic ([place]) 54/07/8118  . Alcohol withdrawal delirium ([place]) 10/09/2012  . Colon adenomas 06/18/2012  . Degenerative joint disease   . LBBB (left bundle branch block)   . Tobacco abuse   . Alcohol dependence ([place])   . Thrombocytopenia ([place])   . Hypertensive heart disease   . Anxiety and depression   . Hyperlipidemia 10/03/2010  . CEREBROVASCULAR DISEASE 10/03/2010  . CHRONIC OBSTRUCTIVE PULMONARY DISEASE 10/03/2010  . DEGENERATIVE JOINT DISEASE, SHOULDER 10/02/2007   Past Medical History:  Diagnosis Date  . AICD (automatic cardioverter/defibrillator) present 08/19/2015   [employer] BiV ICD for primary prevention by Dr. Lovena Le  . Alcohol abuse    6 beers per day; hospital admission in 2009 for withdrawal symptoms  . Alcoholic cirrhosis ([place])   . Anxiety and depression   . Cerebrovascular disease 2009   TIA; 2009- right ICA stent; re-intervention for restenosis complicated by [facility] w/o sx  . CHF (congestive heart failure) ([place]) 06-02-14  . Chronic obstructive pulmonary disease ([place])   . Degenerative joint disease    Total shoulder arthroplasty-right  . Hyperlipidemia   . Hypertension   . LBBB (left bundle branch block)    Normal echo-2011; stress nuclear in 09/2010--septal hypoperfusion  representing nontransmural infarction or the effect of left bundle branch block, no ischemia  . Myocardial infarction [facility]) June 02, 2014   Massive Heart Attack  . Peripheral vascular disease ([place])   . 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  . Thrombocytopenia ([place])   . Tobacco abuse    -100 pack years; 1.5 packs per day  . Tubular adenoma of colon      Allergies as of 08/18/2017   No Known Allergies     Medication List    TAKE these medications   acetaminophen 500 MG tablet Commonly known as:  TYLENOL Take 500 mg by mouth daily as needed for moderate pain.   aspirin EC 81 MG tablet Take 81 mg by mouth every evening.   atorvastatin 20 MG tablet Commonly known as:  LIPITOR Take 20 mg by mouth daily.   carvedilol 3.125 MG tablet Commonly known as:  COREG Take 1 tablet (3.125 mg total) by mouth 2 (two) times daily.   CENTRUM SILVER PO Take 1 tablet by mouth daily.  ciprofloxacin 500 MG tablet Commonly known as:  CIPRO Take 1 tablet (500 mg total) by mouth 2 (two) times daily.   clopidogrel 75 MG tablet Commonly known as:  PLAVIX Take 75 mg by mouth every evening.   dextromethorphan-guaiFENesin 30-600 MG 12hr tablet Commonly known as:  MUCINEX DM Take 1 tablet by mouth daily.   ENTRESTO 49-51 MG Generic drug:  sacubitril-valsartan TAKE 1 TABLET TWICE A DAY   Fish Oil-Flax Oil-Borage Oil Caps Take 1 capsule by mouth daily.   folic acid 834 MCG tablet Commonly known as:  FOLVITE Take 800 mcg by mouth daily.   FUSION PLUS Caps Take 1 capsule by mouth daily.   gabapentin 300 MG capsule Commonly known as:  NEURONTIN Take 300 mg by mouth 2 (two) times daily.   oxyCODONE 5 MG immediate release tablet Commonly known as:  ROXICODONE Take 1 tablet (5 mg total) by mouth every 6 (six) hours as needed for severe pain.   pantoprazole 40 MG tablet Commonly known as:  PROTONIX TAKE 1 TABLET DAILY   PARoxetine 20 MG tablet Commonly known as:   PAXIL Take 20 mg by mouth daily.   potassium chloride SA 20 MEQ tablet Commonly known as:  K-DUR,KLOR-CON Take 20 mEq by mouth daily.   SUPER B COMPLEX/C PO Take 1 tablet by mouth daily.   tamsulosin 0.4 MG Caps capsule Commonly known as:  FLOMAX Take 0.4 mg by mouth daily.            Discharge Care Instructions        Start     Ordered   08/18/17 0000  oxyCODONE (ROXICODONE) 5 MG immediate release tablet  Every 6 hours PRN    Question:  Supervising Provider  Answer:  [name]   08/18/17 0825   08/18/17 0000  Discharge patient    Question Answer Comment  Discharge disposition 01-Home or Self Care   Discharge patient date 08/18/2017      08/18/17 0825      Discharge Instructions: [facility] Discharge instructions Lower Extremity Bypass Surgery  Please refer to the following instruction for your post-procedure care. Your surgeon or physician assistant will discuss any changes with you.  Activity  You are encouraged to walk as much as you can. You can slowly return to normal activities during the month after your surgery. Avoid strenuous activity and heavy lifting until your doctor tells you it's OK. Avoid activities such as vacuuming or swinging a golf club. Do not drive until your doctor give the OK and you are no longer taking prescription pain medications. It is also normal to have difficulty with sleep habits, eating and bowel movement after surgery. These will go away with time.  Bathing/Showering  You may shower after you go home. Do not soak in a bathtub, hot tub, or swim until the incision heals completely.  Incision Care  Clean your incision with mild soap and water. Shower every day. Pat the area dry with a clean towel. You do not need a bandage unless otherwise instructed. Do not apply any ointments or creams to your incision. If you have open wounds you will be instructed how to care for them or a visiting nurse may  be arranged for you. If you have staples or sutures along your incision they will be removed at your post-op appointment. You may have skin glue on your incision. Do not peel it off. It will come off on its own in  about one week.  Wash the groin wound with soap and water daily and pat dry. (No tub bath-only shower)  Then put a dry gauze or washcloth in the groin to keep this area dry to help prevent wound infection.  Do this daily and as needed.  Do not use Vaseline or neosporin on your incisions.  Only use soap and water on your incisions and then protect and keep dry.  Diet  Resume your normal diet. There are no special food restrictions following this procedure. A low fat/ low cholesterol diet is recommended for all patients with vascular disease. In order to heal from your surgery, it is CRITICAL to get adequate nutrition. Your body requires vitamins, minerals, and protein. Vegetables are the best source of vitamins and minerals. Vegetables also provide the perfect balance of protein. Processed food has little nutritional value, so try to avoid this.  Medications  Resume taking all your medications unless your doctor or Physician Assistant tells you not to. If your incision is causing pain, you may take over-the-counter pain relievers such as acetaminophen (Tylenol). If you were prescribed a stronger pain medication, please aware these medication can cause nausea and constipation. Prevent nausea by taking the medication with a snack or meal. Avoid constipation by drinking plenty of fluids and eating foods with high amount of fiber, such as fruits, vegetables, and grains. Take Colace 100 mg (an over-the-counter stool softener) twice a day as needed for constipation. Do not take Tylenol if you are taking prescription pain medications.  Follow Up  Our office will schedule a follow up appointment 2-3 weeks following discharge.  Please call us immediately for any of the following conditions  .Severe  or worsening pain in your legs or feet while at rest or while walking .Increase pain, redness, warmth, or drainage (pus) from your incision site(s) Fever of 101 degree or higher The swelling in your leg with the bypass suddenly worsens and becomes more painful than when you were in the hospital If you have been instructed to feel your graft pulse then you should do so every day. If you can no longer feel this pulse, call the office immediately. Not all patients are given this instruction.  Leg swelling is common after leg bypass surgery.  The swelling should improve over a few months following surgery. To improve the swelling, you may elevate your legs above the level of your heart while you are sitting or resting. Your surgeon or physician assistant may ask you to apply an ACE wrap or wear compression (TED) stockings to help to reduce swelling.  Reduce your risk of vascular disease  Stop smoking. If you would like help call [facility] at 1-800-QUIT-NOW 413-250-1442) or [place] at (867)332-7846.  Manage your cholesterol Maintain a desired weight Control your diabetes weight Control your diabetes Keep your blood pressure down  If you have any questions, please call the office at [phone]   Prescriptions given: 1.  Oxycodone#20 No Refill  Disposition: home  Patient's condition: is Good  Follow up: 1. Dr. [name] in 2 weeks   Leontine Locket, PA-C [facility] [phone] 08/22/2017  1:06 PM  - For VQI Registry use ---   Post-op:  Wound infection: No  Graft infection: No  Transfusion: No    If yes, n/a units given New Arrhythmia: No Ipsilateral amputation: No, [ ]  Minor, [ ]  BKA, [ ]  AKA Discharge patency: [x ] Primary, [ ]  Primary assisted, [ ]  Secondary, [ ]  Occluded Patency  judged by: [ ]  Dopper only, [ ]  Palpable graft pulse, [x]  Palpable distal pulse, [ ]  ABI inc. > 0.15, [ ]  Duplex Discharge ABI: R not done, L  D/C Ambulatory Status:  Ambulatory  Complications: MI: No, [ ]  Troponin only, [ ]  EKG or Clinical CHF: No Resp failure:No, [ ]  Pneumonia, [ ]  Ventilator Chg in renal function: No, [ ]  Inc. Cr > 0.5, [ ]  Temp. Dialysis,  [ ]  Permanent dialysis Stroke: No, [ ]  Minor, [ ]  Major Return to OR: No  Reason for return to OR: [ ]  Bleeding, [ ]  Infection, [ ]  Thrombosis, [ ]  Revision  Discharge medications: Statin use:  yes ASA use:  yes Plavix use:  yes Beta blocker use: yes CCB use:  No ACEI use:   no ARB use:  yes Coumadin use: no

## 2017-08-23 DIAGNOSIS — I110 Hypertensive heart disease with heart failure: Secondary | ICD-10-CM | POA: Diagnosis not present

## 2017-08-23 DIAGNOSIS — M6281 Muscle weakness (generalized): Secondary | ICD-10-CM | POA: Diagnosis not present

## 2017-08-23 DIAGNOSIS — F17210 Nicotine dependence, cigarettes, uncomplicated: Secondary | ICD-10-CM | POA: Diagnosis not present

## 2017-08-23 DIAGNOSIS — Z48812 Encounter for surgical aftercare following surgery on the circulatory system: Secondary | ICD-10-CM | POA: Diagnosis not present

## 2017-08-23 DIAGNOSIS — J449 Chronic obstructive pulmonary disease, unspecified: Secondary | ICD-10-CM | POA: Diagnosis not present

## 2017-08-23 DIAGNOSIS — I5022 Chronic systolic (congestive) heart failure: Secondary | ICD-10-CM | POA: Diagnosis not present

## 2017-08-27 DIAGNOSIS — I5022 Chronic systolic (congestive) heart failure: Secondary | ICD-10-CM | POA: Diagnosis not present

## 2017-08-27 DIAGNOSIS — J449 Chronic obstructive pulmonary disease, unspecified: Secondary | ICD-10-CM | POA: Diagnosis not present

## 2017-08-27 DIAGNOSIS — I110 Hypertensive heart disease with heart failure: Secondary | ICD-10-CM | POA: Diagnosis not present

## 2017-08-27 DIAGNOSIS — F17210 Nicotine dependence, cigarettes, uncomplicated: Secondary | ICD-10-CM | POA: Diagnosis not present

## 2017-08-27 DIAGNOSIS — M6281 Muscle weakness (generalized): Secondary | ICD-10-CM | POA: Diagnosis not present

## 2017-08-27 DIAGNOSIS — Z48812 Encounter for surgical aftercare following surgery on the circulatory system: Secondary | ICD-10-CM | POA: Diagnosis not present

## 2017-08-29 ENCOUNTER — Other Ambulatory Visit: Payer: Self-pay | Admitting: Cardiovascular Disease

## 2017-08-29 DIAGNOSIS — I5022 Chronic systolic (congestive) heart failure: Secondary | ICD-10-CM | POA: Diagnosis not present

## 2017-08-29 DIAGNOSIS — M6281 Muscle weakness (generalized): Secondary | ICD-10-CM | POA: Diagnosis not present

## 2017-08-29 DIAGNOSIS — J449 Chronic obstructive pulmonary disease, unspecified: Secondary | ICD-10-CM | POA: Diagnosis not present

## 2017-08-29 DIAGNOSIS — Z48812 Encounter for surgical aftercare following surgery on the circulatory system: Secondary | ICD-10-CM | POA: Diagnosis not present

## 2017-08-29 DIAGNOSIS — I110 Hypertensive heart disease with heart failure: Secondary | ICD-10-CM | POA: Diagnosis not present

## 2017-08-29 DIAGNOSIS — F17210 Nicotine dependence, cigarettes, uncomplicated: Secondary | ICD-10-CM | POA: Diagnosis not present

## 2017-08-29 NOTE — Addendum Note (Signed)
Addended by: Lianne Cure A on: 08/29/2017 10:42 AM   Modules accepted: Orders

## 2017-09-02 ENCOUNTER — Ambulatory Visit (INDEPENDENT_AMBULATORY_CARE_PROVIDER_SITE_OTHER): Payer: Self-pay | Admitting: Vascular Surgery

## 2017-09-02 ENCOUNTER — Encounter (HOSPITAL_COMMUNITY): Payer: Self-pay | Admitting: *Deleted

## 2017-09-02 ENCOUNTER — Telehealth: Payer: Self-pay | Admitting: *Deleted

## 2017-09-02 ENCOUNTER — Encounter: Payer: Self-pay | Admitting: Vascular Surgery

## 2017-09-02 ENCOUNTER — Other Ambulatory Visit: Payer: Self-pay | Admitting: *Deleted

## 2017-09-02 ENCOUNTER — Encounter: Payer: Self-pay | Admitting: *Deleted

## 2017-09-02 VITALS — BP 157/78 | HR 73 | Temp 97.8°F | Resp 20 | Ht 63.0 in | Wt 157.0 lb

## 2017-09-02 DIAGNOSIS — I739 Peripheral vascular disease, unspecified: Secondary | ICD-10-CM

## 2017-09-02 NOTE — Telephone Encounter (Signed)
Returned call to patient.  He had left a message that he was having drainage and redness from incisional site of left leg.  An appointment was scheduled for today, September 04, 2017 to see Dr Bridgett Larsson.  I left this appointment time on the voice mail of [phone].

## 2017-09-02 NOTE — Progress Notes (Signed)
Pt denies any acute cardiopulmonary issues. Pt under the care of Dr. Bronson Ing, Cardiology. Pt stated that he was instructed to continue taking Plavix. Pt made aware to stop taking vitamins, fish oil and herbal medications. ICD orders initiated. Pt verbalized understanding of all pre-op instructions.

## 2017-09-02 NOTE — Progress Notes (Signed)
Postoperative Visit   History of Present Illness   Roy [name] is a 71 y.o. year old male who presents for postoperative follow-up for: L CFA to AK pop BPG w/ ips NR GSV, L profundplasty, L EIA EA by Dr [name] (08/16/17).  The patient's wounds are not healing.  The patient complains of near constant drainage from AK pop exposure incision.  Past Medical History:  Diagnosis Date  . AICD (automatic cardioverter/defibrillator) present 08/19/2015   St Jude BiV ICD for primary prevention by Dr. Lovena Le  . Alcohol abuse    6 beers per day; hospital admission in 2009 for withdrawal symptoms  . Alcoholic cirrhosis ([place])   . Anxiety and depression   . Cerebrovascular disease 2009   TIA; 2009- right ICA stent; re-intervention for restenosis complicated by [facility] w/o sx  . CHF (congestive heart failure) ([place]) 06-02-14  . Chronic obstructive pulmonary disease ([place])   . Degenerative joint disease    Total shoulder arthroplasty-right  . Hyperlipidemia   . Hypertension   . LBBB (left bundle branch block)    Normal echo-2011; stress nuclear in 09/2010--septal hypoperfusion representing nontransmural infarction or the effect of left bundle branch block, no ischemia  . Myocardial infarction [facility]) June 02, 2014   Massive Heart Attack  . Peripheral vascular disease ([place])   . 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  . Thrombocytopenia ([place])   . Tobacco abuse    -100 pack years; 1.5 packs per day  . Tubular adenoma of colon     Past Surgical History:  Procedure Laterality Date  . ABDOMINAL AORTAGRAM [name] 05/04/2014   Procedure: ABDOMINAL Maxcine Ham;  Surgeon: [name], MD;  Location: [facility] CATH LAB;  Service: Cardiovascular;  Laterality: [name];  . ABDOMINAL AORTOGRAM [name] 06/25/2017   Procedure: Abdominal Aortogram;  Surgeon: [name], MD;  Location: [place] CV LAB;  Service: Cardiovascular;  Laterality: [name];  . AGILE CAPSULE [name] 03/18/2014   Procedure: AGILE CAPSULE;  Surgeon:  Danie Binder, MD;  [facility];  Service: Endoscopy;  Laterality: [name];  7:30  . BACK SURGERY    . BACTERIAL OVERGROWTH TEST [name] 05/24/2015   Procedure: BACTERIAL OVERGROWTH TEST;  Surgeon: Danie Binder, MD;  [facility];  Service: Endoscopy;  Laterality: [name];  0700  . BI-VENTRICULAR IMPLANTABLE CARDIOVERTER DEFIBRILLATOR  (CRT-D)  08/19/2015  . CARPAL TUNNEL RELEASE Left 02/02/2016   Procedure: LEFT CARPAL TUNNEL RELEASE;  Surgeon: Daryll Brod, MD;  Location: [place];  Service: Orthopedics;  Laterality: Left;  ANESTHESIA: IV REGIONAL UPPER ARM  . CATARACT EXTRACTION W/PHACO Right 03/08/2015   Procedure: CATARACT EXTRACTION PHACO AND INTRAOCULAR LENS PLACEMENT (IOC);  Surgeon: Rutherford Guys, MD;  Location: [facility] ORS;  Service: Ophthalmology;  Laterality: Right;  CDE:9.46  . CATARACT EXTRACTION W/PHACO Left 03/22/2015   Procedure: CATARACT EXTRACTION PHACO AND INTRAOCULAR LENS PLACEMENT (IOC);  Surgeon: Rutherford Guys, MD;  Location: [facility] ORS;  Service: Ophthalmology;  Laterality: Left;  CDE:5.80  . COLONOSCOPY  08/22/09   Fields-(Tubular Adenoma)3-mm transverse polyp/4-mm polyp otherwise noraml/small internal hemorrhoids  . COLONOSCOPY [name] 04/14/2014   [id number] internal hemorrhids/normal mocsa in the terminal iluem/left colonis redundant  . COLONOSCOPY W/ POLYPECTOMY  2011  . ENDARTERECTOMY FEMORAL Left 08/16/2017   Procedure: ENDARTERECTOMY LEFT PROFUNDA FEMORAL;  Surgeon: [name], MD;  Location: [place];  Service: Vascular;  Laterality: Left;  . EP IMPLANTABLE DEVICE [name] 08/19/2015   Procedure: BiV ICD Insertion CRT-D;  Surgeon:  Evans Lance, MD;  Location: [place] CV LAB;  Service: Cardiovascular;  Laterality: [name];  . ESOPHAGOGASTRODUODENOSCOPY [name] 03/05/2014   SLF: 1. Stricture at the gastroesophagael junction 2. Small hiatal hernia 3. Moderate non-erosive gastritis and duodentitis. 4. No surce for Melena identified.   . FEMORAL-POPLITEAL BYPASS GRAFT  Left 08/16/2017   Procedure: LEFT  FEMORAL-POPLITEAL ARTERY  BYPASS GRAFT;  Surgeon: [name], MD;  Location: [place];  Service: Vascular;  Laterality: Left;  . GIVENS CAPSULE STUDY [name] 03/30/2014   Procedure: GIVENS CAPSULE STUDY;  Surgeon: Danie Binder, MD;  [facility];  Service: Endoscopy;  Laterality: [name];  7:30  . IR ANGIO INTRA EXTRACRAN SEL COM CAROTID INNOMINATE BILAT MOD SED  07/16/2017  . IR ANGIO VERTEBRAL SEL SUBCLAVIAN INNOMINATE BILAT MOD SED  07/16/2017  . IR RADIOLOGIST EVAL & MGMT  04/01/2017  . IR RADIOLOGIST EVAL & MGMT  08/02/2017  . JOINT REPLACEMENT Right   . LOWER EXTREMITY ANGIOGRAPHY Bilateral 06/25/2017   Procedure: Lower Extremity Angiography;  Surgeon: [name], MD;  Location: [place] CV LAB;  Service: Cardiovascular;  Laterality: Bilateral;  . LUMBAR FUSION  2010  . TOTAL SHOULDER ARTHROPLASTY Right 2011   Dr. Tamera Punt  . ULNAR NERVE TRANSPOSITION Left 02/02/2016   Procedure: LEFT IN-SITU DECOMPRESSION ULNAR NERVE ;  Surgeon: Daryll Brod, MD;  Location: [place];  Service: Orthopedics;  Laterality: Left;  . ULNAR TUNNEL RELEASE Left 02/02/2016   Procedure: LEFT CUBITAL TUNNEL RELEASE;  Surgeon: Daryll Brod, MD;  Location: [place];  Service: Orthopedics;  Laterality: Left;  [name] VASECTOMY  1971  . VEIN HARVEST Left 08/16/2017   Procedure: USING NON REVERSE LEFT GREATER SAPHENOUS VEIN HARVEST;  Surgeon: [name], MD;  [facility];  Service: Vascular;  Laterality: Left;    Social History   Social History  . Marital status: Married    Spouse name: [name]  . Number of children: 2  . Years of education: [name]   Occupational History  . 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  Retired   Social History Main Topics  . Smoking status: Current Every Day Smoker    Packs/day: 1.00    Years: 60.00    Types: Cigarettes    Start date: 08/20/1957  . Smokeless tobacco: Never Used  . Alcohol use No     Comment:  quit in July 2015; No ETOH to date 02/14/15; no ETOH to date 08/13/17  . Drug use: No  . Sexual activity: No   Other Topics Concern  . Not on file   Social History Narrative   Accompanied by daughter [name]   Lives w/ wife, daughter     Family History  Problem Relation Age of Onset  . Coronary artery disease Mother   . Diabetes Mother   . Heart disease Mother        Before age 42 - 34 Bypasses  . Hypertension Mother   . Heart attack Mother        3-4 Heart attacks  . Alzheimer's disease Father   . Diabetes Father   . Heart attack Sister   . Cancer Brother        Prostate  . Hyperlipidemia Son   . Prostate cancer Brother   . Alzheimer's disease Sister   . Colon cancer Neg Hx     Current Outpatient Prescriptions  Medication Sig Dispense Refill  . acetaminophen (TYLENOL) 500 MG  tablet Take 500 mg by mouth daily as needed for moderate pain.    [name] aspirin EC 81 MG tablet Take 81 mg by mouth every evening.     [name] atorvastatin (LIPITOR) 20 MG tablet Take 20 mg by mouth daily.     . carvedilol (COREG) 3.125 MG tablet Take 1 tablet (3.125 mg total) by mouth 2 (two) times daily. 180 tablet 3  . ciprofloxacin (CIPRO) 500 MG tablet Take 1 tablet (500 mg total) by mouth 2 (two) times daily. 20 tablet 0  . clopidogrel (PLAVIX) 75 MG tablet Take 75 mg by mouth every evening.     [name] dextromethorphan-guaiFENesin (MUCINEX DM) 30-600 MG 12hr tablet Take 1 tablet by mouth daily.    [name] ENTRESTO 49-51 MG TAKE 1 TABLET TWICE A DAY 180 tablet 3  . Flax Oil-Fish Oil-Borage Oil (FISH OIL-FLAX OIL-BORAGE OIL) CAPS Take 1 capsule by mouth daily.    . folic acid (FOLVITE) 950 MCG tablet Take 800 mcg by mouth daily.    [name] gabapentin (NEURONTIN) 300 MG capsule Take 300 mg by mouth 2 (two) times daily.     . Iron-FA-B Cmp-C-Biot-Probiotic (FUSION PLUS) CAPS Take 1 capsule by mouth daily.     . Multiple Vitamins-Minerals (CENTRUM SILVER PO) Take 1 tablet by mouth daily.     [name] oxyCODONE (ROXICODONE) 5  MG immediate release tablet Take 1 tablet (5 mg total) by mouth every 6 (six) hours as needed for severe pain. 20 tablet 0  . pantoprazole (PROTONIX) 40 MG tablet TAKE 1 TABLET DAILY 90 tablet 3  . PARoxetine (PAXIL) 20 MG tablet Take 20 mg by mouth daily.    . potassium chloride SA (K-DUR,KLOR-CON) 20 MEQ tablet Take 20 mEq by mouth daily.    . SUPER B COMPLEX/C PO Take 1 tablet by mouth daily.    . tamsulosin (FLOMAX) 0.4 MG CAPS capsule Take 0.4 mg by mouth daily.     No current facility-administered medications for this visit.      No Known Allergies  REVIEW OF SYSTEMS (negative unless checked):   Cardiac:  []  Chest pain or chest pressure? []  Shortness of breath upon activity? []  Shortness of breath when lying flat? []  Irregular heart rhythm?  Vascular:  [x]  Pain in calf, thigh, or hip brought on by walking? []  Pain in feet at night that wakes you up from your sleep? []  Blood clot in your veins? []  Leg swelling?  Pulmonary:  []  Oxygen at home? []  Productive cough? []  Wheezing?  Neurologic:  []  Sudden weakness in arms or legs? []  Sudden numbness in arms or legs? []  Sudden onset of difficult speaking or slurred speech? []  Temporary loss of vision in one eye? []  Problems with dizziness?  Gastrointestinal:  []  Blood in stool? []  Vomited blood?  Genitourinary:  []  Burning when urinating? []  Blood in urine?  Psychiatric:  []  Major depression  Hematologic:  []  Bleeding problems? []  Problems with blood clotting?  Dermatologic:  []  Rashes or ulcers?  Constitutional:  []  Fever or chills?  Ear/Nose/Throat:  []  Change in hearing? []  Nose bleeds? []  Sore throat?  Musculoskeletal:  []  Back pain? []  Joint pain? []  Muscle pain?   Physical Examination   Vitals:   09/02/17 1518 09/02/17 1520  BP: (!) 158/76 (!) 157/78  Pulse: 73   Resp: 20   Temp: 97.8 F (36.6 C)   TempSrc: Oral   SpO2: 99%   Weight: 157 lb (71.2 kg)   Height: 5\' 3"  (1.6 m)  Pulmonary Sym exp, good air movt, CTAB, no rales, rhonchi, & wheezing  Cardiac RRR, Nl S1, S2, no Murmurs, rubs or gallops  LLE Right groin incision with some small areas of necrotic skin, distal thigh incision with small amount of separation with serous drainage evident and erythema surrounding incision, warm L foot     Medical Decision Making   KASHIUS DOMINIC is a 71 y.o. year old male who presents s/p  L CFA to AK pop BPG w/ ips NR GSV, L profundplasty, L EIA EA complicated by likely leaking seroma  Will plan on I&D L thigh seroma tomorrow in OR with likely VAC placement. Risks include but are not limited to bleeding, infection, nerve injury, stroke, myocardial infarction and death.   Suspect VAC home health arrangements will require admission to complete given administrative difficulties in the past with such. Thank you for allowing [place] to participate in this patient's care.   Adele Barthel, MD, FACS [facility] [place] Office: [phone] Pager: [phone]

## 2017-09-03 ENCOUNTER — Encounter (HOSPITAL_COMMUNITY)
Admission: RE | Disposition: A | Discharge status: Home / Self Care | Payer: Self-pay | Source: Ambulatory Visit | Attending: Surgery

## 2017-09-03 ENCOUNTER — Ambulatory Visit (HOSPITAL_COMMUNITY): Payer: Medicare Other | Admitting: Certified Registered Nurse Anesthetist

## 2017-09-03 ENCOUNTER — Observation Stay (HOSPITAL_COMMUNITY)
Admission: RE | Admit: 2017-09-03 | Discharge: 2017-09-04 | Disposition: A | Discharge status: Home / Self Care | Payer: Medicare Other | Source: Ambulatory Visit | Attending: Surgery | Admitting: Surgery

## 2017-09-03 ENCOUNTER — Encounter (HOSPITAL_COMMUNITY): Payer: Self-pay | Admitting: Certified Registered Nurse Anesthetist

## 2017-09-03 DIAGNOSIS — Z950 Presence of cardiac pacemaker: Secondary | ICD-10-CM | POA: Insufficient documentation

## 2017-09-03 DIAGNOSIS — T819XXA Unspecified complication of procedure, initial encounter: Secondary | ICD-10-CM | POA: Diagnosis present

## 2017-09-03 DIAGNOSIS — I97648 Postprocedural seroma of a circulatory system organ or structure following other circulatory system procedure: Principal | ICD-10-CM | POA: Insufficient documentation

## 2017-09-03 DIAGNOSIS — Z95828 Presence of other vascular implants and grafts: Secondary | ICD-10-CM | POA: Insufficient documentation

## 2017-09-03 DIAGNOSIS — F1021 Alcohol dependence, in remission: Secondary | ICD-10-CM | POA: Insufficient documentation

## 2017-09-03 DIAGNOSIS — I5022 Chronic systolic (congestive) heart failure: Secondary | ICD-10-CM | POA: Diagnosis not present

## 2017-09-03 DIAGNOSIS — K7030 Alcoholic cirrhosis of liver without ascites: Secondary | ICD-10-CM | POA: Diagnosis not present

## 2017-09-03 DIAGNOSIS — F329 Major depressive disorder, single episode, unspecified: Secondary | ICD-10-CM | POA: Insufficient documentation

## 2017-09-03 DIAGNOSIS — J449 Chronic obstructive pulmonary disease, unspecified: Secondary | ICD-10-CM | POA: Diagnosis not present

## 2017-09-03 DIAGNOSIS — Y832 Surgical operation with anastomosis, bypass or graft as the cause of abnormal reaction of the patient, or of later complication, without mention of misadventure at the time of the procedure: Secondary | ICD-10-CM | POA: Diagnosis not present

## 2017-09-03 DIAGNOSIS — Z7982 Long term (current) use of aspirin: Secondary | ICD-10-CM | POA: Insufficient documentation

## 2017-09-03 DIAGNOSIS — F419 Anxiety disorder, unspecified: Secondary | ICD-10-CM | POA: Insufficient documentation

## 2017-09-03 DIAGNOSIS — I255 Ischemic cardiomyopathy: Secondary | ICD-10-CM | POA: Insufficient documentation

## 2017-09-03 DIAGNOSIS — M1990 Unspecified osteoarthritis, unspecified site: Secondary | ICD-10-CM | POA: Insufficient documentation

## 2017-09-03 DIAGNOSIS — Z79899 Other long term (current) drug therapy: Secondary | ICD-10-CM | POA: Insufficient documentation

## 2017-09-03 DIAGNOSIS — I252 Old myocardial infarction: Secondary | ICD-10-CM | POA: Insufficient documentation

## 2017-09-03 DIAGNOSIS — Z96611 Presence of right artificial shoulder joint: Secondary | ICD-10-CM | POA: Diagnosis not present

## 2017-09-03 DIAGNOSIS — F17210 Nicotine dependence, cigarettes, uncomplicated: Secondary | ICD-10-CM | POA: Insufficient documentation

## 2017-09-03 DIAGNOSIS — E785 Hyperlipidemia, unspecified: Secondary | ICD-10-CM | POA: Insufficient documentation

## 2017-09-03 DIAGNOSIS — I739 Peripheral vascular disease, unspecified: Secondary | ICD-10-CM | POA: Insufficient documentation

## 2017-09-03 DIAGNOSIS — Z8673 Personal history of transient ischemic attack (TIA), and cerebral infarction without residual deficits: Secondary | ICD-10-CM | POA: Insufficient documentation

## 2017-09-03 DIAGNOSIS — N289 Disorder of kidney and ureter, unspecified: Secondary | ICD-10-CM | POA: Insufficient documentation

## 2017-09-03 DIAGNOSIS — I110 Hypertensive heart disease with heart failure: Secondary | ICD-10-CM | POA: Diagnosis not present

## 2017-09-03 DIAGNOSIS — I447 Left bundle-branch block, unspecified: Secondary | ICD-10-CM | POA: Diagnosis not present

## 2017-09-03 DIAGNOSIS — Z7902 Long term (current) use of antithrombotics/antiplatelets: Secondary | ICD-10-CM | POA: Diagnosis not present

## 2017-09-03 DIAGNOSIS — Z95810 Presence of automatic (implantable) cardiac defibrillator: Secondary | ICD-10-CM | POA: Insufficient documentation

## 2017-09-03 DIAGNOSIS — I97622 Postprocedural seroma of a circulatory system organ or structure following other procedure: Secondary | ICD-10-CM | POA: Diagnosis present

## 2017-09-03 DIAGNOSIS — I5033 Acute on chronic diastolic (congestive) heart failure: Secondary | ICD-10-CM | POA: Diagnosis not present

## 2017-09-03 DIAGNOSIS — T888XXD Other specified complications of surgical and medical care, not elsewhere classified, subsequent encounter: Secondary | ICD-10-CM | POA: Diagnosis not present

## 2017-09-03 HISTORY — DX: Traumatic secondary and recurrent hemorrhage and seroma, initial encounter: T79.2XXA

## 2017-09-03 HISTORY — PX: APPLICATION OF WOUND VAC: SHX5189

## 2017-09-03 HISTORY — PX: I & D EXTREMITY: SHX5045

## 2017-09-03 LAB — CBC
HEMATOCRIT: 37.4 % — AB (ref 39.0–52.0)
Hemoglobin: 12.4 g/dL — ABNORMAL LOW (ref 13.0–17.0)
MCH: 30.2 pg (ref 26.0–34.0)
MCHC: 33.2 g/dL (ref 30.0–36.0)
MCV: 91 fL (ref 78.0–100.0)
PLATELETS: 369 10*3/uL (ref 150–400)
RBC: 4.11 MIL/uL — ABNORMAL LOW (ref 4.22–5.81)
RDW: 13.3 % (ref 11.5–15.5)
WBC: 7.4 10*3/uL (ref 4.0–10.5)

## 2017-09-03 LAB — BASIC METABOLIC PANEL
ANION GAP: 9 (ref 5–15)
BUN: 5 mg/dL — ABNORMAL LOW (ref 6–20)
CHLORIDE: 98 mmol/L — AB (ref 101–111)
CO2: 28 mmol/L (ref 22–32)
Calcium: 9.1 mg/dL (ref 8.9–10.3)
Creatinine, Ser: 0.62 mg/dL (ref 0.61–1.24)
GFR calc Af Amer: >60 mL/min (ref >60)
Glucose, Bld: 111 mg/dL — ABNORMAL HIGH (ref 65–99)
POTASSIUM: 4.7 mmol/L (ref 3.5–5.1)
SODIUM: 135 mmol/L (ref 135–145)

## 2017-09-03 SURGERY — IRRIGATION AND DEBRIDEMENT EXTREMITY
Anesthesia: General | Site: Leg Upper | Laterality: Left

## 2017-09-03 MED ORDER — ONDANSETRON HCL 4 MG/2ML IJ SOLN: 4.0000 mg | Freq: Four times a day (QID) | INTRAMUSCULAR | Status: DC | PRN

## 2017-09-03 MED ORDER — 0.9 % SODIUM CHLORIDE (POUR BTL) OPTIME
TOPICAL | Status: DC | PRN
  Administered 2017-09-03: 1000 mL

## 2017-09-03 MED ORDER — FISH OIL-FLAX OIL-BORAGE OIL PO CAPS: 1.0000 | ORAL_CAPSULE | Freq: Every day | ORAL | Status: DC

## 2017-09-03 MED ORDER — LACTATED RINGERS IV SOLN: INTRAVENOUS | Status: DC

## 2017-09-03 MED ORDER — TAMSULOSIN HCL 0.4 MG PO CAPS
0.4000 mg | ORAL_CAPSULE | Freq: Every day | ORAL | Status: DC
  Administered 2017-09-03 – 2017-09-04 (×2): 0.4 mg via ORAL
  Filled 2017-09-03 (×2): qty 1

## 2017-09-03 MED ORDER — BISACODYL 10 MG RE SUPP: 10.0000 mg | Freq: Every day | RECTAL | Status: DC | PRN

## 2017-09-03 MED ORDER — SUPER B COMPLEX/C PO CAPS: ORAL_CAPSULE | Freq: Every day | ORAL | Status: DC

## 2017-09-03 MED ORDER — FENTANYL CITRATE (PF) 100 MCG/2ML IJ SOLN
INTRAMUSCULAR | Status: DC | PRN
  Administered 2017-09-03 (×2): 25 ug via INTRAVENOUS

## 2017-09-03 MED ORDER — ACETAMINOPHEN 650 MG RE SUPP: 325.0000 mg | RECTAL | Status: DC | PRN

## 2017-09-03 MED ORDER — FLEET ENEMA 7-19 GM/118ML RE ENEM: 1.0000 | ENEMA | Freq: Once | RECTAL | Status: DC | PRN

## 2017-09-03 MED ORDER — HYDRALAZINE HCL 20 MG/ML IJ SOLN: 5.0000 mg | INTRAMUSCULAR | Status: DC | PRN

## 2017-09-03 MED ORDER — ENOXAPARIN SODIUM 40 MG/0.4ML ~~LOC~~ SOLN
40.0000 mg | SUBCUTANEOUS | Status: DC
  Administered 2017-09-03: 40 mg via SUBCUTANEOUS
  Filled 2017-09-03: qty 0.4

## 2017-09-03 MED ORDER — PHENYLEPHRINE HCL 10 MG/ML IJ SOLN
INTRAMUSCULAR | Status: DC | PRN
  Administered 2017-09-03: 120 ug via INTRAVENOUS
  Administered 2017-09-03: 160 ug via INTRAVENOUS
  Administered 2017-09-03: 120 ug via INTRAVENOUS

## 2017-09-03 MED ORDER — PANTOPRAZOLE SODIUM 40 MG PO TBEC
40.0000 mg | DELAYED_RELEASE_TABLET | Freq: Every day | ORAL | Status: DC
  Administered 2017-09-04: 40 mg via ORAL
  Filled 2017-09-03: qty 1

## 2017-09-03 MED ORDER — GUAIFENESIN-DM 100-10 MG/5ML PO SYRP: 15.0000 mL | ORAL_SOLUTION | ORAL | Status: DC | PRN

## 2017-09-03 MED ORDER — POTASSIUM CHLORIDE CRYS ER 20 MEQ PO TBCR: 20.0000 meq | EXTENDED_RELEASE_TABLET | Freq: Once | ORAL | Status: DC

## 2017-09-03 MED ORDER — LACTATED RINGERS IV SOLN
INTRAVENOUS | Status: DC
  Administered 2017-09-03: 07:00:00 via INTRAVENOUS

## 2017-09-03 MED ORDER — PROMETHAZINE HCL 25 MG/ML IJ SOLN: 6.2500 mg | INTRAMUSCULAR | Status: DC | PRN

## 2017-09-03 MED ORDER — SODIUM CHLORIDE 0.9 % IV SOLN: INTRAVENOUS | Status: DC

## 2017-09-03 MED ORDER — CARVEDILOL 3.125 MG PO TABS
3.1250 mg | ORAL_TABLET | Freq: Two times a day (BID) | ORAL | Status: DC
  Administered 2017-09-03 – 2017-09-04 (×2): 3.125 mg via ORAL
  Filled 2017-09-03 (×3): qty 1

## 2017-09-03 MED ORDER — FUSION PLUS PO CAPS: 1.0000 | ORAL_CAPSULE | Freq: Every day | ORAL | Status: DC

## 2017-09-03 MED ORDER — LIDOCAINE 2% (20 MG/ML) 5 ML SYRINGE
INTRAMUSCULAR | Status: DC | PRN
  Administered 2017-09-03: 60 mg via INTRAVENOUS

## 2017-09-03 MED ORDER — ADULT MULTIVITAMIN W/MINERALS CH
ORAL_TABLET | Freq: Every day | ORAL | Status: DC
  Administered 2017-09-03 – 2017-09-04 (×2): 1 via ORAL
  Filled 2017-09-03 (×2): qty 1

## 2017-09-03 MED ORDER — DEXTROSE 5 % IV SOLN
1.5000 g | INTRAVENOUS | Status: AC
  Administered 2017-09-03: 1.5 g via INTRAVENOUS
  Filled 2017-09-03: qty 1.5

## 2017-09-03 MED ORDER — PHENOL 1.4 % MT LIQD: 1.0000 | OROMUCOSAL | Status: DC | PRN

## 2017-09-03 MED ORDER — ALUM & MAG HYDROXIDE-SIMETH 200-200-20 MG/5ML PO SUSP: 15.0000 mL | ORAL | Status: DC | PRN

## 2017-09-03 MED ORDER — OXYCODONE HCL 5 MG PO TABS: 5.0000 mg | ORAL_TABLET | Freq: Four times a day (QID) | ORAL | Status: DC | PRN

## 2017-09-03 MED ORDER — ASPIRIN EC 81 MG PO TBEC
81.0000 mg | DELAYED_RELEASE_TABLET | Freq: Every evening | ORAL | Status: DC
  Administered 2017-09-03: 81 mg via ORAL
  Filled 2017-09-03: qty 1

## 2017-09-03 MED ORDER — POLYETHYLENE GLYCOL 3350 17 G PO PACK: 17.0000 g | PACK | Freq: Every day | ORAL | Status: DC | PRN

## 2017-09-03 MED ORDER — ACETAMINOPHEN 325 MG PO TABS: 325.0000 mg | ORAL_TABLET | ORAL | Status: DC | PRN

## 2017-09-03 MED ORDER — DOCUSATE SODIUM 100 MG PO CAPS
100.0000 mg | ORAL_CAPSULE | Freq: Two times a day (BID) | ORAL | Status: DC
  Administered 2017-09-03 – 2017-09-04 (×2): 100 mg via ORAL
  Filled 2017-09-03 (×2): qty 1

## 2017-09-03 MED ORDER — HYDROMORPHONE HCL 1 MG/ML IJ SOLN: 0.2500 mg | INTRAMUSCULAR | Status: DC | PRN

## 2017-09-03 MED ORDER — MEPERIDINE HCL 25 MG/ML IJ SOLN: 6.2500 mg | INTRAMUSCULAR | Status: DC | PRN

## 2017-09-03 MED ORDER — GABAPENTIN 300 MG PO CAPS
300.0000 mg | ORAL_CAPSULE | Freq: Two times a day (BID) | ORAL | Status: DC
  Administered 2017-09-03 – 2017-09-04 (×2): 300 mg via ORAL
  Filled 2017-09-03 (×3): qty 1

## 2017-09-03 MED ORDER — SACUBITRIL-VALSARTAN 49-51 MG PO TABS
1.0000 | ORAL_TABLET | Freq: Two times a day (BID) | ORAL | Status: DC
  Administered 2017-09-03 – 2017-09-04 (×3): 1 via ORAL
  Filled 2017-09-03 (×3): qty 1

## 2017-09-03 MED ORDER — PROPOFOL 10 MG/ML IV BOLUS
INTRAVENOUS | Status: DC | PRN
  Administered 2017-09-03: 200 mg via INTRAVENOUS

## 2017-09-03 MED ORDER — LABETALOL HCL 5 MG/ML IV SOLN: 10.0000 mg | INTRAVENOUS | Status: DC | PRN

## 2017-09-03 MED ORDER — ONDANSETRON HCL 4 MG/2ML IJ SOLN
INTRAMUSCULAR | Status: DC | PRN
  Administered 2017-09-03: 4 mg via INTRAVENOUS

## 2017-09-03 MED ORDER — PAROXETINE HCL 20 MG PO TABS
20.0000 mg | ORAL_TABLET | Freq: Every day | ORAL | Status: DC
  Administered 2017-09-03 – 2017-09-04 (×2): 20 mg via ORAL
  Filled 2017-09-03 (×2): qty 1

## 2017-09-03 MED ORDER — POTASSIUM CHLORIDE CRYS ER 20 MEQ PO TBCR
20.0000 meq | EXTENDED_RELEASE_TABLET | Freq: Every day | ORAL | Status: DC
  Administered 2017-09-03 – 2017-09-04 (×2): 20 meq via ORAL
  Filled 2017-09-03 (×2): qty 1

## 2017-09-03 MED ORDER — FENTANYL CITRATE (PF) 250 MCG/5ML IJ SOLN
INTRAMUSCULAR | Status: AC
  Filled 2017-09-03: qty 5

## 2017-09-03 MED ORDER — FOLIC ACID 1 MG PO TABS
1.0000 mg | ORAL_TABLET | Freq: Every day | ORAL | Status: DC
  Administered 2017-09-03 – 2017-09-04 (×2): 1 mg via ORAL
  Filled 2017-09-03 (×2): qty 1

## 2017-09-03 MED ORDER — CHLORHEXIDINE GLUCONATE CLOTH 2 % EX PADS: 6.0000 | MEDICATED_PAD | Freq: Once | CUTANEOUS | Status: DC

## 2017-09-03 MED ORDER — CLOPIDOGREL BISULFATE 75 MG PO TABS
75.0000 mg | ORAL_TABLET | Freq: Every evening | ORAL | Status: DC
  Administered 2017-09-03: 75 mg via ORAL
  Filled 2017-09-03: qty 1

## 2017-09-03 MED ORDER — PROPOFOL 10 MG/ML IV BOLUS
INTRAVENOUS | Status: AC
  Filled 2017-09-03: qty 20

## 2017-09-03 MED ORDER — METOPROLOL TARTRATE 5 MG/5ML IV SOLN: 2.0000 mg | INTRAVENOUS | Status: DC | PRN

## 2017-09-03 MED ORDER — EPHEDRINE SULFATE 50 MG/ML IJ SOLN
INTRAMUSCULAR | Status: DC | PRN
  Administered 2017-09-03 (×2): 15 mg via INTRAVENOUS

## 2017-09-03 MED ORDER — ATORVASTATIN CALCIUM 20 MG PO TABS
20.0000 mg | ORAL_TABLET | Freq: Every day | ORAL | Status: DC
  Administered 2017-09-03 – 2017-09-04 (×2): 20 mg via ORAL
  Filled 2017-09-03 (×2): qty 1

## 2017-09-03 SURGICAL SUPPLY — 30 items
BANDAGE ACE 4X5 VEL STRL LF (GAUZE/BANDAGES/DRESSINGS) IMPLANT
BANDAGE ACE 6X5 VEL STRL LF (GAUZE/BANDAGES/DRESSINGS) IMPLANT
BNDG GAUZE ELAST 4 BULKY (GAUZE/BANDAGES/DRESSINGS) IMPLANT
CANISTER SUCT 3000ML PPV (MISCELLANEOUS) ×4 IMPLANT
CANISTER WOUND CARE 500ML ATS (WOUND CARE) ×2 IMPLANT
COVER SURGICAL LIGHT HANDLE (MISCELLANEOUS) ×6 IMPLANT
DRAPE HALF SHEET 40X57 (DRAPES) ×2 IMPLANT
DRAPE INCISE IOBAN 66X45 STRL (DRAPES) IMPLANT
DRAPE ORTHO SPLIT 77X108 STRL (DRAPES) ×8
DRAPE SURG ORHT 6 SPLT 77X108 (DRAPES) IMPLANT
DRSG VAC ATS SM SENSATRAC (GAUZE/BANDAGES/DRESSINGS) ×2 IMPLANT
ELECT REM PT RETURN 9FT ADLT (ELECTROSURGICAL) ×4
ELECTRODE REM PT RTRN 9FT ADLT (ELECTROSURGICAL) ×2 IMPLANT
GAUZE SPONGE 4X4 12PLY STRL (GAUZE/BANDAGES/DRESSINGS) ×4 IMPLANT
GLOVE BIO SURGEON STRL SZ7 (GLOVE) ×4 IMPLANT
GLOVE BIOGEL PI IND STRL 7.5 (GLOVE) ×2 IMPLANT
GLOVE BIOGEL PI INDICATOR 7.5 (GLOVE) ×2
GOWN STRL REUS W/ TWL LRG LVL3 (GOWN DISPOSABLE) ×6 IMPLANT
GOWN STRL REUS W/TWL LRG LVL3 (GOWN DISPOSABLE) ×8
KIT BASIN OR (CUSTOM PROCEDURE TRAY) ×4 IMPLANT
KIT ROOM TURNOVER OR (KITS) ×4 IMPLANT
NS IRRIG 1000ML POUR BTL (IV SOLUTION) ×4 IMPLANT
PACK GENERAL/GYN (CUSTOM PROCEDURE TRAY) ×2 IMPLANT
PAD ARMBOARD 7.5X6 YLW CONV (MISCELLANEOUS) ×8 IMPLANT
SUT ETHILON 3 0 PS 1 (SUTURE) IMPLANT
SUT MNCRL AB 4-0 PS2 18 (SUTURE) IMPLANT
SUT VIC AB 2-0 CTX 36 (SUTURE) IMPLANT
SUT VIC AB 3-0 SH 27 (SUTURE)
SUT VIC AB 3-0 SH 27X BRD (SUTURE) IMPLANT
WATER STERILE IRR 1000ML POUR (IV SOLUTION) ×4 IMPLANT

## 2017-09-03 NOTE — H&P (View-Only) (Signed)
Postoperative Visit   History of Present Illness   Roy [name] is a 71 y.o. year old male who presents for postoperative follow-up for: L CFA to AK pop BPG w/ ips NR GSV, L profundplasty, L EIA EA by Dr [name] (08/16/17).  The patient's wounds are not healing.  The patient complains of near constant drainage from AK pop exposure incision.  Past Medical History:  Diagnosis Date  . AICD (automatic cardioverter/defibrillator) present 08/19/2015   St Jude BiV ICD for primary prevention by Dr. Lovena Le  . Alcohol abuse    6 beers per day; hospital admission in 2009 for withdrawal symptoms  . Alcoholic cirrhosis ([place])   . Anxiety and depression   . Cerebrovascular disease 2009   TIA; 2009- right ICA stent; re-intervention for restenosis complicated by [facility] w/o sx  . CHF (congestive heart failure) ([place]) 06-02-14  . Chronic obstructive pulmonary disease ([place])   . Degenerative joint disease    Total shoulder arthroplasty-right  . Hyperlipidemia   . Hypertension   . LBBB (left bundle branch block)    Normal echo-2011; stress nuclear in 09/2010--septal hypoperfusion representing nontransmural infarction or the effect of left bundle branch block, no ischemia  . Myocardial infarction [facility]) June 02, 2014   Massive Heart Attack  . Peripheral vascular disease ([place])   . 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  . Thrombocytopenia ([place])   . Tobacco abuse    -100 pack years; 1.5 packs per day  . Tubular adenoma of colon     Past Surgical History:  Procedure Laterality Date  . ABDOMINAL AORTAGRAM [name] 05/04/2014   Procedure: ABDOMINAL Maxcine Ham;  Surgeon: [name], MD;  Location: [facility] CATH LAB;  Service: Cardiovascular;  Laterality: [name];  . ABDOMINAL AORTOGRAM [name] 06/25/2017   Procedure: Abdominal Aortogram;  Surgeon: [name], MD;  Location: [place] CV LAB;  Service: Cardiovascular;  Laterality: [name];  . AGILE CAPSULE [name] 03/18/2014   Procedure: AGILE CAPSULE;  Surgeon:  Danie Binder, MD;  [facility];  Service: Endoscopy;  Laterality: [name];  7:30  . BACK SURGERY    . BACTERIAL OVERGROWTH TEST [name] 05/24/2015   Procedure: BACTERIAL OVERGROWTH TEST;  Surgeon: Danie Binder, MD;  [facility];  Service: Endoscopy;  Laterality: [name];  0700  . BI-VENTRICULAR IMPLANTABLE CARDIOVERTER DEFIBRILLATOR  (CRT-D)  08/19/2015  . CARPAL TUNNEL RELEASE Left 02/02/2016   Procedure: LEFT CARPAL TUNNEL RELEASE;  Surgeon: Daryll Brod, MD;  Location: [place];  Service: Orthopedics;  Laterality: Left;  ANESTHESIA: IV REGIONAL UPPER ARM  . CATARACT EXTRACTION W/PHACO Right 03/08/2015   Procedure: CATARACT EXTRACTION PHACO AND INTRAOCULAR LENS PLACEMENT (IOC);  Surgeon: Rutherford Guys, MD;  [facility];  Service: Ophthalmology;  Laterality: Right;  CDE:9.46  . CATARACT EXTRACTION W/PHACO Left 03/22/2015   Procedure: CATARACT EXTRACTION PHACO AND INTRAOCULAR LENS PLACEMENT (IOC);  Surgeon: Rutherford Guys, MD;  [facility];  Service: Ophthalmology;  Laterality: Left;  CDE:5.80  . COLONOSCOPY  08/22/09   Fields-(Tubular Adenoma)3-mm transverse polyp/4-mm polyp otherwise noraml/small internal hemorrhoids  . COLONOSCOPY [name] 04/14/2014   [id number] internal hemorrhids/normal mocsa in the terminal iluem/left colonis redundant  . COLONOSCOPY W/ POLYPECTOMY  2011  . ENDARTERECTOMY FEMORAL Left 08/16/2017   Procedure: ENDARTERECTOMY LEFT PROFUNDA FEMORAL;  Surgeon: [name], MD;  Location: [place];  Service: Vascular;  Laterality: Left;  . EP IMPLANTABLE DEVICE [name] 08/19/2015   Procedure: BiV ICD Insertion CRT-D;  Surgeon:  Evans Lance, MD;  Location: [place] CV LAB;  Service: Cardiovascular;  Laterality: [name];  . ESOPHAGOGASTRODUODENOSCOPY [name] 03/05/2014   SLF: 1. Stricture at the gastroesophagael junction 2. Small hiatal hernia 3. Moderate non-erosive gastritis and duodentitis. 4. No surce for Melena identified.   . FEMORAL-POPLITEAL BYPASS GRAFT  Left 08/16/2017   Procedure: LEFT  FEMORAL-POPLITEAL ARTERY  BYPASS GRAFT;  Surgeon: [name], MD;  Location: [place];  Service: Vascular;  Laterality: Left;  . GIVENS CAPSULE STUDY [name] 03/30/2014   Procedure: GIVENS CAPSULE STUDY;  Surgeon: Danie Binder, MD;  [facility];  Service: Endoscopy;  Laterality: [name];  7:30  . IR ANGIO INTRA EXTRACRAN SEL COM CAROTID INNOMINATE BILAT MOD SED  07/16/2017  . IR ANGIO VERTEBRAL SEL SUBCLAVIAN INNOMINATE BILAT MOD SED  07/16/2017  . IR RADIOLOGIST EVAL & MGMT  04/01/2017  . IR RADIOLOGIST EVAL & MGMT  08/02/2017  . JOINT REPLACEMENT Right   . LOWER EXTREMITY ANGIOGRAPHY Bilateral 06/25/2017   Procedure: Lower Extremity Angiography;  Surgeon: [name], MD;  Location: [place] CV LAB;  Service: Cardiovascular;  Laterality: Bilateral;  . LUMBAR FUSION  2010  . TOTAL SHOULDER ARTHROPLASTY Right 2011   Dr. Tamera Punt  . ULNAR NERVE TRANSPOSITION Left 02/02/2016   Procedure: LEFT IN-SITU DECOMPRESSION ULNAR NERVE ;  Surgeon: Daryll Brod, MD;  Location: [place];  Service: Orthopedics;  Laterality: Left;  . ULNAR TUNNEL RELEASE Left 02/02/2016   Procedure: LEFT CUBITAL TUNNEL RELEASE;  Surgeon: Daryll Brod, MD;  Location: [place];  Service: Orthopedics;  Laterality: Left;  [name] VASECTOMY  1971  . VEIN HARVEST Left 08/16/2017   Procedure: USING NON REVERSE LEFT GREATER SAPHENOUS VEIN HARVEST;  Surgeon: [name], MD;  [facility];  Service: Vascular;  Laterality: Left;    Social History   Social History  . Marital status: Married    Spouse name: [name]  . Number of children: 2  . Years of education: [name]   Occupational History  . 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  Retired   Social History Main Topics  . Smoking status: Current Every Day Smoker    Packs/day: 1.00    Years: 60.00    Types: Cigarettes    Start date: 08/20/1957  . Smokeless tobacco: Never Used  . Alcohol use No     Comment:  quit in July 2015; No ETOH to date 02/14/15; no ETOH to date 08/13/17  . Drug use: No  . Sexual activity: No   Other Topics Concern  . Not on file   Social History Narrative   Accompanied by daughter [name]   Lives w/ wife, daughter     Family History  Problem Relation Age of Onset  . Coronary artery disease Mother   . Diabetes Mother   . Heart disease Mother        Before age 82 - 2 Bypasses  . Hypertension Mother   . Heart attack Mother        3-4 Heart attacks  . Alzheimer's disease Father   . Diabetes Father   . Heart attack Sister   . Cancer Brother        Prostate  . Hyperlipidemia Son   . Prostate cancer Brother   . Alzheimer's disease Sister   . Colon cancer Neg Hx     Current Outpatient Prescriptions  Medication Sig Dispense Refill  . acetaminophen (TYLENOL) 500 MG  tablet Take 500 mg by mouth daily as needed for moderate pain.    [name] aspirin EC 81 MG tablet Take 81 mg by mouth every evening.     [name] atorvastatin (LIPITOR) 20 MG tablet Take 20 mg by mouth daily.     . carvedilol (COREG) 3.125 MG tablet Take 1 tablet (3.125 mg total) by mouth 2 (two) times daily. 180 tablet 3  . ciprofloxacin (CIPRO) 500 MG tablet Take 1 tablet (500 mg total) by mouth 2 (two) times daily. 20 tablet 0  . clopidogrel (PLAVIX) 75 MG tablet Take 75 mg by mouth every evening.     [name] dextromethorphan-guaiFENesin (MUCINEX DM) 30-600 MG 12hr tablet Take 1 tablet by mouth daily.    [name] ENTRESTO 49-51 MG TAKE 1 TABLET TWICE A DAY 180 tablet 3  . Flax Oil-Fish Oil-Borage Oil (FISH OIL-FLAX OIL-BORAGE OIL) CAPS Take 1 capsule by mouth daily.    . folic acid (FOLVITE) 875 MCG tablet Take 800 mcg by mouth daily.    [name] gabapentin (NEURONTIN) 300 MG capsule Take 300 mg by mouth 2 (two) times daily.     . Iron-FA-B Cmp-C-Biot-Probiotic (FUSION PLUS) CAPS Take 1 capsule by mouth daily.     . Multiple Vitamins-Minerals (CENTRUM SILVER PO) Take 1 tablet by mouth daily.     [name] oxyCODONE (ROXICODONE) 5  MG immediate release tablet Take 1 tablet (5 mg total) by mouth every 6 (six) hours as needed for severe pain. 20 tablet 0  . pantoprazole (PROTONIX) 40 MG tablet TAKE 1 TABLET DAILY 90 tablet 3  . PARoxetine (PAXIL) 20 MG tablet Take 20 mg by mouth daily.    . potassium chloride SA (K-DUR,KLOR-CON) 20 MEQ tablet Take 20 mEq by mouth daily.    . SUPER B COMPLEX/C PO Take 1 tablet by mouth daily.    . tamsulosin (FLOMAX) 0.4 MG CAPS capsule Take 0.4 mg by mouth daily.     No current facility-administered medications for this visit.      No Known Allergies  REVIEW OF SYSTEMS (negative unless checked):   Cardiac:  []  Chest pain or chest pressure? []  Shortness of breath upon activity? []  Shortness of breath when lying flat? []  Irregular heart rhythm?  Vascular:  [x]  Pain in calf, thigh, or hip brought on by walking? []  Pain in feet at night that wakes you up from your sleep? []  Blood clot in your veins? []  Leg swelling?  Pulmonary:  []  Oxygen at home? []  Productive cough? []  Wheezing?  Neurologic:  []  Sudden weakness in arms or legs? []  Sudden numbness in arms or legs? []  Sudden onset of difficult speaking or slurred speech? []  Temporary loss of vision in one eye? []  Problems with dizziness?  Gastrointestinal:  []  Blood in stool? []  Vomited blood?  Genitourinary:  []  Burning when urinating? []  Blood in urine?  Psychiatric:  []  Major depression  Hematologic:  []  Bleeding problems? []  Problems with blood clotting?  Dermatologic:  []  Rashes or ulcers?  Constitutional:  []  Fever or chills?  Ear/Nose/Throat:  []  Change in hearing? []  Nose bleeds? []  Sore throat?  Musculoskeletal:  []  Back pain? []  Joint pain? []  Muscle pain?   Physical Examination   Vitals:   09/02/17 1518 09/02/17 1520  BP: (!) 158/76 (!) 157/78  Pulse: 73   Resp: 20   Temp: 97.8 F (36.6 C)   TempSrc: Oral   SpO2: 99%   Weight: 157 lb (71.2 kg)   Height: 5\' 3"  (1.6 m)  Pulmonary Sym exp, good air movt, CTAB, no rales, rhonchi, & wheezing  Cardiac RRR, Nl S1, S2, no Murmurs, rubs or gallops  LLE Right groin incision with some small areas of necrotic skin, distal thigh incision with small amount of separation with serous drainage evident and erythema surrounding incision, warm L foot     Medical Decision Making   Roy [name] is a 71 y.o. year old male who presents s/p  L CFA to AK pop BPG w/ ips NR GSV, L profundplasty, L EIA EA complicated by likely leaking seroma  Will plan on I&D L thigh seroma tomorrow in OR with likely VAC placement. Risks include but are not limited to bleeding, infection, nerve injury, stroke, myocardial infarction and death.   Suspect VAC home health arrangements will require admission to complete given administrative difficulties in the past with such. Thank you for allowing [place] to participate in this patient's care.   Adele Barthel, MD, FACS [facility] [place] Office: [phone] Pager: [phone]

## 2017-09-03 NOTE — Progress Notes (Signed)
LMOM to call.

## 2017-09-03 NOTE — Op Note (Addendum)
    OPERATIVE NOTE   PROCEDURE: 1. Incision and drainage of left thigh seroma 2. Negative pressure dressing application (10 cm x 1 cm x 2 cm) 3. Debridement of dead fat x 2  PRE-OPERATIVE DIAGNOSIS: left distal thigh seroma  POST-OPERATIVE DIAGNOSIS: same as above   SURGEON: Adele Barthel, MD  ANESTHESIA: general  ESTIMATED BLOOD LOSS: 50 cc  FINDING(S): 1.  Serous fluid accumulating in vein harvest space 2.  Gelatinuous organization consistent with serous lymphatic fluid 3.  Underlying plane to bypass graft healed 4.  No evidence of infection  SPECIMEN(S):  none  INDICATIONS:   Roy [name] is a 71 y.o. male who presents with persistent drainage from left above-the-knee popliteal exposure incision.  I recommended: incision and drainage of seroma left thigh and placement of negative pressure dressing.  The risk, benefits, and alternative for bypass operations were discussed with the patient.  The patient is aware the risks include but are not limited to: bleeding, infection, myocardial infarction, stroke, nerve damage, need for additional procedures in the future, and wound complications.  The patient is aware of these risks and agreed to proceed.  DESCRIPTION: After obtaining full informed written consent, the patient was brought back to the operating room and placed supine upon the operating table.  The patient received IV antibiotics prior to induction.  A procedure time out was completed and the correct surgical site was verified.  After obtaining adequate anesthesia, the patient was prepped and draped in the standard fashion for: left above-the-knee popliteal artery exposure.  I bluntly dissected through the incision and sucked out 50 cc of serous fluid.  I then sharply opened the subcuticular stitch and removed the remaining pieces of the stitch.  I sucked out remaining serous fluid in what appeared to be a plane dissecting down to the vein harvest incision.  I mechanically  debrided some gelatinous material consistent with organized lympathic fluid.  There was no evidence of infection.  I fashioned a VAC sponge for the dimensions of the surgical incision.  I affixed this sponge with adhesive strips.  A hole was cut in the middle of the adhesive strips and the lilypad attached.  The VAC tubing was connected to the VAC pump at 125 continuous.  The VAC dressing became adherent without an air leak.  At this point, I sharply debride a couple areas of dead fat in the groin incision and most proximal vein harvest incision.  A wet-to-dry dressing with sterile 2x2 dressing were applied to each of these exposed portions of the wound.  A dry dressing was applied to each area.   COMPLICATIONS: none  CONDITION: stable   Adele Barthel, MD, [facility] [place] Office: [phone] Pager: [phone]  09/03/2017, 8:04 AM

## 2017-09-03 NOTE — Progress Notes (Signed)
Patient admitted from PACU, A&Ox3, VSS. Tele applied and CCMD notified.

## 2017-09-03 NOTE — Anesthesia Procedure Notes (Signed)
Procedure Name: LMA Insertion Date/Time: 09/03/2017 7:37 AM Performed by: Salli Quarry Laisha Rau Pre-anesthesia Checklist: Patient identified, Emergency Drugs available, Suction available and Patient being monitored Patient Re-evaluated:Patient Re-evaluated prior to induction Oxygen Delivery Method: Circle System Utilized Preoxygenation: Pre-oxygenation with 100% oxygen Induction Type: IV induction Ventilation: Mask ventilation without difficulty LMA: LMA inserted LMA Size: 5.0 Number of attempts: 1 Airway Equipment and Method: Bite block Placement Confirmation: positive ETCO2 Tube secured with: Tape Dental Injury: Teeth and Oropharynx as per pre-operative assessment

## 2017-09-03 NOTE — Anesthesia Preprocedure Evaluation (Addendum)
Anesthesia Evaluation  Patient identified by MRN, date of birth, ID band Patient awake    Reviewed: Allergy & Precautions, NPO status , Patient's Chart, lab work & pertinent test results  Airway Mallampati: I  TM Distance: >3 FB Neck ROM: Full    Dental  (+) Edentulous Upper, Edentulous Lower   Pulmonary COPD, Current Smoker,     + decreased breath sounds      Cardiovascular hypertension, + Past MI, + Peripheral Vascular Disease and +CHF  negative cardio ROS  + dysrhythmias + pacemaker + Cardiac Defibrillator  Rhythm:Regular Rate:Normal     Neuro/Psych PSYCHIATRIC DISORDERS Anxiety negative neurological ROS     GI/Hepatic negative GI ROS, Neg liver ROS,   Endo/Other  negative endocrine ROS  Renal/GU Renal disease     Musculoskeletal  (+) Arthritis ,   Abdominal   Peds  Hematology negative hematology ROS (+)   Anesthesia Other Findings Day of surgery medications reviewed with the patient.  Reproductive/Obstetrics negative OB ROS                           Lab Results  Component Value Date   WBC 7.4 09/03/2017   HGB 12.4 (L) 09/03/2017   HCT 37.4 (L) 09/03/2017   MCV 91.0 09/03/2017   PLT 369 09/03/2017   Lab Results  Component Value Date   INR 1.1 08/19/2017   INR 1.00 08/13/2017   INR 1.04 07/16/2017    Echo: - Left ventricle: The cavity size was normal. Wall thickness was   normal. Systolic function was normal. The estimated ejection   fraction was in the range of 50% to 55%. Wall motion was normal;   there were no regional wall motion abnormalities. Doppler   parameters are consistent with abnormal left ventricular   relaxation (grade 1 diastolic dysfunction). Indeterminate filling   pressures. - Aortic valve: Trileaflet; mildly thickened leaflets. - Mitral valve: Calcified annulus. Normal thickness leaflets .   There was mild regurgitation. - Atrial septum: No defect or  patent foramen ovale was identified.   Anesthesia Physical Anesthesia Plan  ASA: III  Anesthesia Plan: General   Post-op Pain Management:    Induction: Intravenous  PONV Risk Score and Plan: 2 and Ondansetron and Dexamethasone  Airway Management Planned: LMA  Additional Equipment:   Intra-op Plan:   Post-operative Plan: Extubation in OR  Informed Consent: I have reviewed the patients History and Physical, chart, labs and discussed the procedure including the risks, benefits and alternatives for the proposed anesthesia with the patient or authorized representative who has indicated his/her understanding and acceptance.   Dental advisory given  Plan Discussed with: CRNA  Anesthesia Plan Comments:         Anesthesia Quick Evaluation

## 2017-09-03 NOTE — Transfer of Care (Signed)
Immediate Anesthesia Transfer of Care Note  Patient: Roy [name]  Procedure(s) Performed: IRRIGATION AND DEBRIDEMENT EXTREMITY LEFT THIGH SEROMA (Left ) APPLICATION OF WOUND VAC (Left Leg Upper)  Patient Location: PACU  Anesthesia Type:General  Level of Consciousness: awake, alert , oriented and patient cooperative  Airway & Oxygen Therapy: Patient Spontanous Breathing  Post-op Assessment: Report given to RN and Post -op Vital signs reviewed and stable  Post vital signs: Reviewed and stable  Last Vitals:  Vitals:   09/03/17 0624 09/03/17 0812  BP: (!) 163/60   Pulse: 68   Resp: 18   Temp: 36.5 C (!) 36.3 C  SpO2: 96%     Last Pain:  Vitals:   09/03/17 0703  TempSrc:   PainSc: 0-No pain      Patients Stated Pain Goal: 3 (24/26/83 4196)  Complications: No apparent anesthesia complications

## 2017-09-03 NOTE — Interval H&P Note (Signed)
History and Physical Interval Note:  09/03/2017 7:10 AM  Roy [name]  has presented today for surgery, with the diagnosis of Seroma of Left Thigh T88.8XXD  The various methods of treatment have been discussed with the patient and family. After consideration of risks, benefits and other options for treatment, the patient has consented to  Procedure(s): IRRIGATION AND DEBRIDEMENT EXTREMITY LEFT THIGH SEROMA (Left) as a surgical intervention .  The patient's history has been reviewed, patient examined, no change in status, stable for surgery.  I have reviewed the patient's chart and labs.  Questions were answered to the patient's satisfaction.     Adele Barthel

## 2017-09-03 NOTE — Anesthesia Postprocedure Evaluation (Signed)
Anesthesia Post Note  Patient: Roy [name]  Procedure(s) Performed: IRRIGATION AND DEBRIDEMENT EXTREMITY LEFT THIGH SEROMA (Left ) APPLICATION OF WOUND VAC (Left Leg Upper)     Patient location during evaluation: PACU Anesthesia Type: General Level of consciousness: awake and alert Pain management: pain level controlled Vital Signs Assessment: post-procedure vital signs reviewed and stable Respiratory status: spontaneous breathing, nonlabored ventilation, respiratory function stable and patient connected to nasal cannula oxygen Cardiovascular status: blood pressure returned to baseline and stable Postop Assessment: no apparent nausea or vomiting Anesthetic complications: no    Last Vitals:  Vitals:   09/03/17 1207 09/03/17 1230  BP:    Pulse: 75 73  Resp: 19 18  Temp:  36.6 C  SpO2: 96% 98%    Last Pain:  Vitals:   09/03/17 1207  TempSrc:   PainSc: 0-No pain                 Effie Berkshire

## 2017-09-03 NOTE — Care Management Note (Signed)
Case Management Note Marvetta Gibbons RN, BSN Unit 4E-Case Manager [phone]  Patient Details  Name: Roy [name] MRN: [id number] Date of Birth: 1946/11/22  Subjective/Objective:   Pt admitted s/p I&D of left thight seroma- with wound VAC placement               Action/Plan: PTA  Pt lived at home- referral for home wound VAC received- [facility] wound VAC form placed on shadow chart for signature- CM will f/u for approval process with [facility] once form signed- have spoken with Jermaine with [facility] regarding home VAC needs- spoke with pt at bedside regarding d/c needs- per pt he is already active with [facility] for [facility]- will need resumption order for [facility] with wound VAC drsg change instructions for discharge. - CM will f/u for d/c DME and HH needs.   Expected Discharge Date:                  Expected Discharge Plan:  [place]  In-House Referral:     Discharge planning Services  CM Consult  Post Acute Care Choice:  Durable Medical Equipment, Home Health Choice offered to:  Patient  DME Arranged:  Vac DME Agency:  [place] Arranged:  RN [facility] Agency:  [facility]  Status of Service:  In process, will continue to follow  If discussed at Long Length of Stay Meetings, dates discussed:    Discharge Disposition: home/home health   Additional Comments:  Dawayne Patricia, RN 09/03/2017, 2:55 PM

## 2017-09-04 ENCOUNTER — Encounter (HOSPITAL_COMMUNITY): Payer: Self-pay | Admitting: Vascular Surgery

## 2017-09-04 DIAGNOSIS — I97648 Postprocedural seroma of a circulatory system organ or structure following other circulatory system procedure: Secondary | ICD-10-CM | POA: Diagnosis not present

## 2017-09-04 MED ORDER — SULFAMETHOXAZOLE-TRIMETHOPRIM 800-160 MG PO TABS: 1.0000 | ORAL_TABLET | Freq: Two times a day (BID) | ORAL | 0 refills | Status: DC

## 2017-09-04 MED ORDER — OXYCODONE HCL 5 MG PO TABS: 5.0000 mg | ORAL_TABLET | Freq: Four times a day (QID) | ORAL | 0 refills | Status: DC | PRN

## 2017-09-04 NOTE — Progress Notes (Signed)
  Progress Note    09/04/2017 7:54 AM 1 Day Post-Op  Subjective:  Very upset with our office as he and the wound care RN has called multiple times about his wound without a call back until he threatened to go to the ER.  Otherwise, no complaints.  Wants to go home so he can have a cigarette.   Afebrile HR  60's-70's  673'A-193'X systolic 90% RA  Vitals:   09/04/17 0400 09/04/17 0501  BP: (!) 129/53   Pulse: 66   Resp: 18   Temp:  97.8 F (36.6 C)  SpO2: 92%     Physical Exam: Cardiac:  regular Lungs:  Non labored Incisions:  Left groin with superficial separation at the distal portion of the wound; wound vac with good seal. Extremities:  Easily palpable left DP pulse   CBC    Component Value Date/Time   WBC 7.4 09/03/2017 0642   RBC 4.11 (L) 09/03/2017 0642   HGB 12.4 (L) 09/03/2017 0642   HCT 37.4 (L) 09/03/2017 0642   HCT 43 04/16/2012 0806   PLT 369 09/03/2017 0642   MCV 91.0 09/03/2017 0642   MCH 30.2 09/03/2017 0642   MCHC 33.2 09/03/2017 0642   RDW 13.3 09/03/2017 0642   LYMPHSABS 1,579 08/19/2017 0939   MONOABS 935 02/13/2017 0859   EOSABS 307 08/19/2017 0939   BASOSABS 32 08/19/2017 0939    BMET    Component Value Date/Time   NA 135 09/03/2017 0642   NA 135 (A) 04/16/2012 0807   K 4.7 09/03/2017 0642   K 4.2 04/16/2012 0807   CL 98 (L) 09/03/2017 0642   CL 99 04/16/2012 0807   CO2 28 09/03/2017 0642   CO2 26 04/16/2012 0807   GLUCOSE 111 (H) 09/03/2017 0642   BUN 5 (L) 09/03/2017 0642   BUN 6 04/16/2012 0807   CREATININE 0.62 09/03/2017 0642   CREATININE 0.56 (L) 08/19/2017 0939   CALCIUM 9.1 09/03/2017 0642   CALCIUM 9.0 04/16/2012 0807   GFRNONAA >60 09/03/2017 0642   GFRNONAA >89 03/03/2014 0957   GFRAA >60 09/03/2017 0642   GFRAA >89 03/03/2014 0957    INR    Component Value Date/Time   INR 1.1 08/19/2017 0939     Intake/Output Summary (Last 24 hours) at 09/04/17 0754 Last data filed at 09/04/17 0700  Gross per 24 hour    Intake              880 ml  Output             1800 ml  Net             -920 ml     Assessment:  71 y.o. male is s/p:  PROCEDURE: 1. Incision and drainage of left thigh seroma 2. Negative pressure dressing application (10 cm x 1 cm x 2 cm) Debridement of dead fat x 2  1 Day Post-Op  Plan: -pt wound vac with good seal and easily palpable left DP pulse -will dc home today with wound vac -has appt on 10/26 for lab and 10/29 with Dr. Zada Girt, PA-C [facility] [phone] 09/04/2017 7:54 AM  Agree with the above Palpable pedal pulse Vac in place.  Anticipate home today with 10 days of bactrim given erythema around medial AK incision.  Annamarie Major

## 2017-09-04 NOTE — Progress Notes (Signed)
Pt is aware.  

## 2017-09-04 NOTE — Discharge Summary (Signed)
Discharge Summary    Roy [name] 10-06-1946 71 y.o. male  [id number]  Admission Date: 09/03/2017  Discharge Date: 09/04/17  Physician: [name], MD  Admission Diagnosis: Seroma of Left Thigh T88.8XXD   HPI:   This is a 71 y.o. male who presents for postoperative follow-up for: L CFA to AK pop BPG w/ ips NR GSV, L profundplasty, L EIA EA by Dr [name] (08/16/17).  The patient's wounds are not healing.  The patient complains of near constant drainage from AK pop exposure incision.  Hospital Course:  The patient was admitted to the hospital and taken to the operating room on 09/03/2017 and underwent: 1. Incision and drainage of left thigh seroma 2. Negative pressure dressing application (10 cm x 1 cm x 2 cm) Debridement of dead fat x 2    FINDING(S): 1.  Serous fluid accumulating in vein harvest space 2.  Gelatinuous organization consistent with serous lymphatic fluid 3.  Underlying plane to bypass graft healed 4.  No evidence of infection  The pt tolerated the procedure well and was transported to the PACU in good condition.   By POD 1, he was doing well.  The wound vac has good seal and he has easily palpable left DP pulse.    The remainder of the hospital course consisted of increasing mobilization and increasing intake of solids without difficulty.  CBC    Component Value Date/Time   WBC 7.4 09/03/2017 0642   RBC 4.11 (L) 09/03/2017 0642   HGB 12.4 (L) 09/03/2017 0642   HCT 37.4 (L) 09/03/2017 0642   HCT 43 04/16/2012 0806   PLT 369 09/03/2017 0642   MCV 91.0 09/03/2017 0642   MCH 30.2 09/03/2017 0642   MCHC 33.2 09/03/2017 0642   RDW 13.3 09/03/2017 0642   LYMPHSABS 1,579 08/19/2017 0939   MONOABS 935 02/13/2017 0859   EOSABS 307 08/19/2017 0939   BASOSABS 32 08/19/2017 0939    BMET    Component Value Date/Time   NA 135 09/03/2017 0642   NA 135 (A) 04/16/2012 0807   K 4.7 09/03/2017 0642   K 4.2 04/16/2012 0807   CL 98 (L) 09/03/2017  0642   CL 99 04/16/2012 0807   CO2 28 09/03/2017 0642   CO2 26 04/16/2012 0807   GLUCOSE 111 (H) 09/03/2017 0642   BUN 5 (L) 09/03/2017 0642   BUN 6 04/16/2012 0807   CREATININE 0.62 09/03/2017 0642   CREATININE 0.56 (L) 08/19/2017 0939   CALCIUM 9.1 09/03/2017 0642   CALCIUM 9.0 04/16/2012 0807   GFRNONAA >60 09/03/2017 0642   GFRNONAA >89 03/03/2014 0957   GFRAA >60 09/03/2017 0642   GFRAA >89 03/03/2014 0957      Discharge Instructions    Call MD for:  redness, tenderness, or signs of infection (pain, swelling, bleeding, redness, odor or green/yellow discharge around incision site)    Complete by:  As directed    Call MD for:  severe or increased pain, loss or decreased feeling  in affected limb(s)    Complete by:  As directed    Call MD for:  temperature >100.5    Complete by:  As directed    Discharge instructions    Complete by:  As directed    Resume previous discharge instructions.   Discharge wound care:    Complete by:  As directed    Wound vac changes on Monday. Wednesday, and Friday starting on 09/06/17.   Resume previous diet    Complete  by:  As directed       Discharge Diagnosis:  Seroma of Left Thigh T88.8XXD  Secondary Diagnosis: Patient Active Problem List   Diagnosis Date Noted  . Complication of surgical procedure 09/03/2017  . History of alcohol abuse 08/19/2017  . PAD (peripheral artery disease) ([place]) 08/16/2017  . ICD (implantable cardioverter-defibrillator), biventricular, in situ 12/01/2015  . Cardiomyopathy, ischemic 08/20/2015  . Normocytic anemia 10/14/2014  . Weight gain 06/29/2014  . Hypokalemia 06/29/2014  . Chronic systolic heart failure ([place]) 06/15/2014  . COPD exacerbation ([place]) 06/15/2014  . Hypernatremia 06/15/2014  . Acute renal failure ([place]) 06/15/2014  . Encephalopathy 06/15/2014  . Loose stools 06/15/2014  . Respiratory failure ([place]) 06/03/2014  . Acute respiratory failure ([place]) 06/03/2014  . Pneumonia, organism  unspecified([place]) 06/03/2014  . Elevated troponin 06/03/2014  . NSTEMI (non-ST elevated myocardial infarction) ([place]) 06/03/2014  . Acute on chronic diastolic heart failure ([place]) 06/03/2014  . Peripheral vascular disease, unspecified ([place]) 04/26/2014  . Melena 03/03/2014  . Cirrhosis, alcoholic ([place]) 22/29/7989  . Alcohol withdrawal delirium ([place]) 10/09/2012  . Colon adenomas 06/18/2012  . Degenerative joint disease   . LBBB (left bundle branch block)   . Tobacco abuse   . Alcohol dependence ([place])   . Thrombocytopenia ([place])   . Hypertensive heart disease   . Anxiety and depression   . Hyperlipidemia 10/03/2010  . CEREBROVASCULAR DISEASE 10/03/2010  . CHRONIC OBSTRUCTIVE PULMONARY DISEASE 10/03/2010  . DEGENERATIVE JOINT DISEASE, SHOULDER 10/02/2007   Past Medical History:  Diagnosis Date  . AICD (automatic cardioverter/defibrillator) present 08/19/2015   St Jude BiV ICD for primary prevention by Dr. Lovena Le  . Alcohol abuse    6 beers per day; hospital admission in 2009 for withdrawal symptoms  . Alcoholic cirrhosis ([place])   . Anxiety and depression   . Cerebrovascular disease 2009   TIA; 2009- right ICA stent; re-intervention for restenosis complicated by [facility] w/o sx  . CHF (congestive heart failure) ([place]) 06-02-14  . Chronic obstructive pulmonary disease ([place])   . Degenerative joint disease    Total shoulder arthroplasty-right  . Hyperlipidemia   . Hypertension   . LBBB (left bundle branch block)    Normal echo-2011; stress nuclear in 09/2010--septal hypoperfusion representing nontransmural infarction or the effect of left bundle branch block, no ischemia  . Myocardial infarction [facility]) June 02, 2014   Massive Heart Attack  . Peripheral vascular disease ([place])   . 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  . Thrombocytopenia ([place])   . Tobacco abuse    -100 pack years; 1.5 packs per day  . Traumatic seroma of thigh    left  . Tubular adenoma of colon      Allergies  as of 09/04/2017   No Known Allergies     Medication List    TAKE these medications   acetaminophen 500 MG tablet Commonly known as:  TYLENOL Take 500 mg by mouth daily as needed for moderate pain.   aspirin EC 81 MG tablet Take 81 mg by mouth every evening.   atorvastatin 20 MG tablet Commonly known as:  LIPITOR Take 20 mg by mouth daily.   carvedilol 3.125 MG tablet Commonly known as:  COREG Take 1 tablet (3.125 mg total) by mouth 2 (two) times daily.   CENTRUM SILVER PO Take 1 tablet by mouth daily.   clopidogrel 75 MG tablet Commonly known as:  PLAVIX Take 75 mg by mouth every evening.   dextromethorphan-guaiFENesin 30-600 MG 12hr tablet Commonly known as:  MUCINEX DM Take 1 tablet by mouth daily.   ENTRESTO 49-51 MG Generic drug:  sacubitril-valsartan TAKE 1 TABLET TWICE A DAY   Fish Oil-Flax Oil-Borage Oil Caps Take 1 capsule by mouth daily.   folic acid 283 MCG tablet Commonly known as:  FOLVITE Take 800 mcg by mouth daily.   FUSION PLUS Caps Take 1 capsule by mouth daily.   gabapentin 300 MG capsule Commonly known as:  NEURONTIN Take 300 mg by mouth 2 (two) times daily.   oxyCODONE 5 MG immediate release tablet Commonly known as:  ROXICODONE Take 1 tablet (5 mg total) by mouth every 6 (six) hours as needed for severe pain.   pantoprazole 40 MG tablet Commonly known as:  PROTONIX TAKE 1 TABLET DAILY What changed:  See the new instructions.   PARoxetine 20 MG tablet Commonly known as:  PAXIL Take 20 mg by mouth daily.   potassium chloride SA 20 MEQ tablet Commonly known as:  K-DUR,KLOR-CON Take 20 mEq by mouth daily.   SUPER B COMPLEX/C PO Take 1 tablet by mouth daily.   tamsulosin 0.4 MG Caps capsule Commonly known as:  FLOMAX Take 0.4 mg by mouth daily.            Discharge Care Instructions        Start     Ordered   09/04/17 0000  Discharge wound care:    Comments:  Wound vac changes on Monday. Wednesday, and Friday  starting on 09/06/17.   09/04/17 6629      Prescriptions given: Bactrim DS #20 NR  Instructions: 1.  Resume previous discharge instructions 2.  Wound vac changes M/W/F to start on 09/06/17 3.  Wash the groin wound with soap and water daily and pat dry. (No tub bath-only shower)  Then put a dry gauze or washcloth there to keep this area dry daily and as needed.  Do not use Vaseline or neosporin on your incisions.  Only use soap and water on your incisions and then protect and keep dry.  Disposition: home  Patient's condition: is Good  Follow up: 1. Dr. [name] on 09/23/17   Leontine Locket, PA-C [facility] [phone] 09/04/2017  8:25 AM

## 2017-09-04 NOTE — Care Management Note (Signed)
Case Management Note Marvetta Gibbons RN, BSN Unit 4E-Case Manager [phone]  Patient Details  Name: Roy [name] MRN: [id number] Date of Birth: 07-26-1946  Subjective/Objective:   Pt admitted s/p I&D of left thight seroma- with wound VAC placement               Action/Plan: PTA  Pt lived at home- referral for home wound VAC received- [facility] wound VAC form placed on shadow chart for signature- CM will f/u for approval process with [facility] once form signed- have spoken with Jermaine with [facility] regarding home VAC needs- spoke with pt at bedside regarding d/c needs- per pt he is already active with [facility] for [facility]- will need resumption order for [facility] with wound VAC drsg change instructions for discharge. - CM will f/u for d/c DME and HH needs.   Expected Discharge Date:  09/04/17               Expected Discharge Plan:  [place]  In-House Referral:     Discharge planning Services  CM Consult  Post Acute Care Choice:  Durable Medical Equipment, Home Health, Resumption of Svcs/PTA Provider Choice offered to:  Patient  DME Arranged:  Vac DME Agency:  [place]:  RN [facility] Agency:  [facility]  Status of Service:  Completed, signed off  If discussed at [place] of Stay Meetings, dates discussed:    Discharge Disposition: home/home health   Additional Comments:  09/04/17- 1100- Roy Padin RN, CM- pt for d/c home today- [facility] wound VAC order form has been signed- spoke with Jermaine with [place] to begin approval process for home wound VAC- once approved- [facility] will deliver home VAC to room and [facility] RN will place home wound VAC on pt prior to discharge- have spoken to pt and wife at bedside to update on process- have also spoken to [facility] with [facility] regarding resumption of [facility] RN services and need for wound VAC drsg changes. Updated [place] orders have been placed.   Dawayne Patricia, RN 09/04/2017, 11:04 AM

## 2017-09-06 DIAGNOSIS — I110 Hypertensive heart disease with heart failure: Secondary | ICD-10-CM | POA: Diagnosis not present

## 2017-09-06 DIAGNOSIS — Z48812 Encounter for surgical aftercare following surgery on the circulatory system: Secondary | ICD-10-CM | POA: Diagnosis not present

## 2017-09-06 DIAGNOSIS — I5022 Chronic systolic (congestive) heart failure: Secondary | ICD-10-CM | POA: Diagnosis not present

## 2017-09-06 DIAGNOSIS — M6281 Muscle weakness (generalized): Secondary | ICD-10-CM | POA: Diagnosis not present

## 2017-09-06 DIAGNOSIS — F17210 Nicotine dependence, cigarettes, uncomplicated: Secondary | ICD-10-CM | POA: Diagnosis not present

## 2017-09-06 DIAGNOSIS — J449 Chronic obstructive pulmonary disease, unspecified: Secondary | ICD-10-CM | POA: Diagnosis not present

## 2017-09-09 DIAGNOSIS — J449 Chronic obstructive pulmonary disease, unspecified: Secondary | ICD-10-CM | POA: Diagnosis not present

## 2017-09-09 DIAGNOSIS — F17210 Nicotine dependence, cigarettes, uncomplicated: Secondary | ICD-10-CM | POA: Diagnosis not present

## 2017-09-09 DIAGNOSIS — I5022 Chronic systolic (congestive) heart failure: Secondary | ICD-10-CM | POA: Diagnosis not present

## 2017-09-09 DIAGNOSIS — M6281 Muscle weakness (generalized): Secondary | ICD-10-CM | POA: Diagnosis not present

## 2017-09-09 DIAGNOSIS — Z48812 Encounter for surgical aftercare following surgery on the circulatory system: Secondary | ICD-10-CM | POA: Diagnosis not present

## 2017-09-09 DIAGNOSIS — I110 Hypertensive heart disease with heart failure: Secondary | ICD-10-CM | POA: Diagnosis not present

## 2017-09-11 DIAGNOSIS — Z48812 Encounter for surgical aftercare following surgery on the circulatory system: Secondary | ICD-10-CM | POA: Diagnosis not present

## 2017-09-11 DIAGNOSIS — I110 Hypertensive heart disease with heart failure: Secondary | ICD-10-CM | POA: Diagnosis not present

## 2017-09-11 DIAGNOSIS — I5022 Chronic systolic (congestive) heart failure: Secondary | ICD-10-CM | POA: Diagnosis not present

## 2017-09-11 DIAGNOSIS — M6281 Muscle weakness (generalized): Secondary | ICD-10-CM | POA: Diagnosis not present

## 2017-09-11 DIAGNOSIS — J449 Chronic obstructive pulmonary disease, unspecified: Secondary | ICD-10-CM | POA: Diagnosis not present

## 2017-09-11 DIAGNOSIS — F17210 Nicotine dependence, cigarettes, uncomplicated: Secondary | ICD-10-CM | POA: Diagnosis not present

## 2017-09-13 DIAGNOSIS — I5022 Chronic systolic (congestive) heart failure: Secondary | ICD-10-CM | POA: Diagnosis not present

## 2017-09-13 DIAGNOSIS — F17210 Nicotine dependence, cigarettes, uncomplicated: Secondary | ICD-10-CM | POA: Diagnosis not present

## 2017-09-13 DIAGNOSIS — Z48812 Encounter for surgical aftercare following surgery on the circulatory system: Secondary | ICD-10-CM | POA: Diagnosis not present

## 2017-09-13 DIAGNOSIS — M6281 Muscle weakness (generalized): Secondary | ICD-10-CM | POA: Diagnosis not present

## 2017-09-13 DIAGNOSIS — J449 Chronic obstructive pulmonary disease, unspecified: Secondary | ICD-10-CM | POA: Diagnosis not present

## 2017-09-13 DIAGNOSIS — I110 Hypertensive heart disease with heart failure: Secondary | ICD-10-CM | POA: Diagnosis not present

## 2017-09-16 DIAGNOSIS — F17210 Nicotine dependence, cigarettes, uncomplicated: Secondary | ICD-10-CM | POA: Diagnosis not present

## 2017-09-16 DIAGNOSIS — J449 Chronic obstructive pulmonary disease, unspecified: Secondary | ICD-10-CM | POA: Diagnosis not present

## 2017-09-16 DIAGNOSIS — I110 Hypertensive heart disease with heart failure: Secondary | ICD-10-CM | POA: Diagnosis not present

## 2017-09-16 DIAGNOSIS — M6281 Muscle weakness (generalized): Secondary | ICD-10-CM | POA: Diagnosis not present

## 2017-09-16 DIAGNOSIS — I5022 Chronic systolic (congestive) heart failure: Secondary | ICD-10-CM | POA: Diagnosis not present

## 2017-09-16 DIAGNOSIS — Z48812 Encounter for surgical aftercare following surgery on the circulatory system: Secondary | ICD-10-CM | POA: Diagnosis not present

## 2017-09-18 DIAGNOSIS — Z48812 Encounter for surgical aftercare following surgery on the circulatory system: Secondary | ICD-10-CM | POA: Diagnosis not present

## 2017-09-18 DIAGNOSIS — J449 Chronic obstructive pulmonary disease, unspecified: Secondary | ICD-10-CM | POA: Diagnosis not present

## 2017-09-18 DIAGNOSIS — I110 Hypertensive heart disease with heart failure: Secondary | ICD-10-CM | POA: Diagnosis not present

## 2017-09-18 DIAGNOSIS — M6281 Muscle weakness (generalized): Secondary | ICD-10-CM | POA: Diagnosis not present

## 2017-09-18 DIAGNOSIS — F17210 Nicotine dependence, cigarettes, uncomplicated: Secondary | ICD-10-CM | POA: Diagnosis not present

## 2017-09-18 DIAGNOSIS — I5022 Chronic systolic (congestive) heart failure: Secondary | ICD-10-CM | POA: Diagnosis not present

## 2017-09-20 ENCOUNTER — Ambulatory Visit (HOSPITAL_COMMUNITY)
Admission: RE | Admit: 2017-09-20 | Discharge: 2017-09-20 | Disposition: A | Discharge status: Home / Self Care | Payer: Medicare Other | Source: Ambulatory Visit | Attending: Vascular Surgery | Admitting: Vascular Surgery

## 2017-09-20 DIAGNOSIS — F17210 Nicotine dependence, cigarettes, uncomplicated: Secondary | ICD-10-CM | POA: Diagnosis not present

## 2017-09-20 DIAGNOSIS — I70213 Atherosclerosis of native arteries of extremities with intermittent claudication, bilateral legs: Secondary | ICD-10-CM | POA: Insufficient documentation

## 2017-09-20 DIAGNOSIS — M6281 Muscle weakness (generalized): Secondary | ICD-10-CM | POA: Diagnosis not present

## 2017-09-20 DIAGNOSIS — J449 Chronic obstructive pulmonary disease, unspecified: Secondary | ICD-10-CM | POA: Diagnosis not present

## 2017-09-20 DIAGNOSIS — Z48812 Encounter for surgical aftercare following surgery on the circulatory system: Secondary | ICD-10-CM | POA: Diagnosis not present

## 2017-09-20 DIAGNOSIS — I5022 Chronic systolic (congestive) heart failure: Secondary | ICD-10-CM | POA: Diagnosis not present

## 2017-09-20 DIAGNOSIS — I110 Hypertensive heart disease with heart failure: Secondary | ICD-10-CM | POA: Diagnosis not present

## 2017-09-23 ENCOUNTER — Encounter: Payer: Self-pay | Admitting: Surgery

## 2017-09-23 ENCOUNTER — Ambulatory Visit (INDEPENDENT_AMBULATORY_CARE_PROVIDER_SITE_OTHER): Payer: Self-pay | Admitting: Surgery

## 2017-09-23 VITALS — BP 148/77 | HR 68 | Temp 97.6°F | Resp 16 | Ht 63.0 in | Wt 158.0 lb

## 2017-09-23 DIAGNOSIS — I70213 Atherosclerosis of native arteries of extremities with intermittent claudication, bilateral legs: Secondary | ICD-10-CM

## 2017-09-23 NOTE — Progress Notes (Signed)
Patient name: Roy [name] MRN: [id number] DOB: [birth year] Sex: male  REASON FOR VISIT:     post op  HISTORY OF PRESENT ILLNESS:   This is a 71 year old gentleman who returns today for follow-up of his claudication symptoms. He is status post bilateral iliac stenting in 2015. This was done for lifestyle limiting claudication. An 8 x 40 self extending stent was placed on the left and 8 x 80 on the right. He had moderate left superficial femoral artery stenosis within the adductor canal and right common femoral artery stenosis. The patient had been doing well up until about 3 months ago. At that time he developed claudication symptoms at 100 feet that were alleviated with rest. He initially has pain in his calf but then this progresses to his hips.  On 08/16/2017 he underwent a left femoral to above-knee popliteal artery bypass graft with ipsilateral non-reversed greater saphenous vein as well as the extensive profunda femoral endarterectomy.  He did well initially however developed a fluid collection around the distal incision, and went back for I and D on 09/03/2017 with wound VAC placement.  He is here today stating that he does not have claudication symptoms in his left leg anymore.  He does have some swelling in the left leg and occasional shooting pains around the ankle.  CURRENT MEDICATIONS:    Current Outpatient Prescriptions  Medication Sig Dispense Refill  . acetaminophen (TYLENOL) 500 MG tablet Take 500 mg by mouth daily as needed for moderate pain.    [name] aspirin EC 81 MG tablet Take 81 mg by mouth every evening.     [name] atorvastatin (LIPITOR) 20 MG tablet Take 20 mg by mouth daily.     . carvedilol (COREG) 3.125 MG tablet Take 1 tablet (3.125 mg total) by mouth 2 (two) times daily. 180 tablet 3  . clopidogrel (PLAVIX) 75 MG tablet Take 75 mg by mouth every evening.     [name] dextromethorphan-guaiFENesin (MUCINEX DM) 30-600 MG 12hr tablet Take 1  tablet by mouth daily.    [name] ENTRESTO 49-51 MG TAKE 1 TABLET TWICE A DAY 180 tablet 3  . Flax Oil-Fish Oil-Borage Oil (FISH OIL-FLAX OIL-BORAGE OIL) CAPS Take 1 capsule by mouth daily.    . folic acid (FOLVITE) 382 MCG tablet Take 800 mcg by mouth daily.    [name] gabapentin (NEURONTIN) 300 MG capsule Take 300 mg by mouth 2 (two) times daily.     . Iron-FA-B Cmp-C-Biot-Probiotic (FUSION PLUS) CAPS Take 1 capsule by mouth daily.     . Multiple Vitamins-Minerals (CENTRUM SILVER PO) Take 1 tablet by mouth daily.     . pantoprazole (PROTONIX) 40 MG tablet TAKE 1 TABLET DAILY (Patient taking differently: TAKE 40 mg by mouth DAILY) 90 tablet 3  . PARoxetine (PAXIL) 20 MG tablet Take 20 mg by mouth daily.    . potassium chloride SA (K-DUR,KLOR-CON) 20 MEQ tablet Take 20 mEq by mouth daily.    . SUPER B COMPLEX/C PO Take 1 tablet by mouth daily.    . tamsulosin (FLOMAX) 0.4 MG CAPS capsule Take 0.4 mg by mouth daily.    [name] oxyCODONE (OXY IR/ROXICODONE) 5 MG immediate release tablet     . sulfamethoxazole-trimethoprim (BACTRIM DS,SEPTRA DS) 800-160 MG tablet Take 1 tablet by mouth 2 (two) times daily. (Patient not taking: Reported on 09/23/2017) 20 tablet 0   No current facility-administered medications for this visit.     REVIEW OF SYSTEMS:   [X]  denotes positive finding, [ ]   denotes negative finding Cardiac  Comments:  Chest pain or chest pressure:    Shortness of breath upon exertion:    Short of breath when lying flat:    Irregular heart rhythm:    Constitutional    Fever or chills:      PHYSICAL EXAM:   Vitals:   09/23/17 1355 09/23/17 1358  BP: (!) 147/75 (!) 148/77  Pulse: 68 68  Resp: 16   Temp: 97.6 F (36.4 C)   SpO2: 97%   Weight: 158 lb (71.7 kg)   Height: 5\' 3"  (1.6 m)     GENERAL: The patient is a well-nourished male, in no acute distress. The vital signs are documented above. CARDIOVASCULAR: There is a regular rate and rhythm. PULMONARY: Non-labored respirations Palpable  dorsalis pedis pulse.  2+ pitting edema to the left leg.  The groin incision has completely healed.  There is residual to centimeter in length by 0.7 cm x 1 mm wound with pink granulation tissue remaining.  STUDIES:   ABIs today 0.68 on the right 0.85 on the left   MEDICAL ISSUES:   Wound VAC will be discontinued today with plans for wet-to-dry dressing change Patient to follow up in 3 months with duplex of his bypass graft and ABIs.  At that time we will consider evaluation for the right leg revascularization.  Annamarie Major, MD [facility] [phone] Pager [phone]

## 2017-09-25 DIAGNOSIS — J449 Chronic obstructive pulmonary disease, unspecified: Secondary | ICD-10-CM | POA: Diagnosis not present

## 2017-09-25 DIAGNOSIS — I110 Hypertensive heart disease with heart failure: Secondary | ICD-10-CM | POA: Diagnosis not present

## 2017-09-25 DIAGNOSIS — F17210 Nicotine dependence, cigarettes, uncomplicated: Secondary | ICD-10-CM | POA: Diagnosis not present

## 2017-09-25 DIAGNOSIS — M6281 Muscle weakness (generalized): Secondary | ICD-10-CM | POA: Diagnosis not present

## 2017-09-25 DIAGNOSIS — I5022 Chronic systolic (congestive) heart failure: Secondary | ICD-10-CM | POA: Diagnosis not present

## 2017-09-25 DIAGNOSIS — Z48812 Encounter for surgical aftercare following surgery on the circulatory system: Secondary | ICD-10-CM | POA: Diagnosis not present

## 2017-09-25 NOTE — Addendum Note (Signed)
Addended by: Lianne Cure A on: 09/25/2017 04:34 PM   Modules accepted: Orders

## 2017-09-27 ENCOUNTER — Other Ambulatory Visit: Payer: Self-pay | Admitting: Internal Medicine

## 2017-09-27 DIAGNOSIS — J449 Chronic obstructive pulmonary disease, unspecified: Secondary | ICD-10-CM | POA: Diagnosis not present

## 2017-09-27 DIAGNOSIS — F17210 Nicotine dependence, cigarettes, uncomplicated: Secondary | ICD-10-CM | POA: Diagnosis not present

## 2017-09-27 DIAGNOSIS — I110 Hypertensive heart disease with heart failure: Secondary | ICD-10-CM | POA: Diagnosis not present

## 2017-09-27 DIAGNOSIS — M6281 Muscle weakness (generalized): Secondary | ICD-10-CM | POA: Diagnosis not present

## 2017-09-27 DIAGNOSIS — I5022 Chronic systolic (congestive) heart failure: Secondary | ICD-10-CM | POA: Diagnosis not present

## 2017-09-27 DIAGNOSIS — Z48812 Encounter for surgical aftercare following surgery on the circulatory system: Secondary | ICD-10-CM | POA: Diagnosis not present

## 2017-09-30 ENCOUNTER — Ambulatory Visit (INDEPENDENT_AMBULATORY_CARE_PROVIDER_SITE_OTHER): Payer: Medicare Other | Admitting: *Deleted

## 2017-09-30 DIAGNOSIS — F17210 Nicotine dependence, cigarettes, uncomplicated: Secondary | ICD-10-CM | POA: Diagnosis not present

## 2017-09-30 DIAGNOSIS — I5022 Chronic systolic (congestive) heart failure: Secondary | ICD-10-CM | POA: Diagnosis not present

## 2017-09-30 DIAGNOSIS — M6281 Muscle weakness (generalized): Secondary | ICD-10-CM | POA: Diagnosis not present

## 2017-09-30 DIAGNOSIS — I255 Ischemic cardiomyopathy: Secondary | ICD-10-CM | POA: Diagnosis not present

## 2017-09-30 DIAGNOSIS — I110 Hypertensive heart disease with heart failure: Secondary | ICD-10-CM | POA: Diagnosis not present

## 2017-09-30 DIAGNOSIS — Z48812 Encounter for surgical aftercare following surgery on the circulatory system: Secondary | ICD-10-CM | POA: Diagnosis not present

## 2017-09-30 DIAGNOSIS — J449 Chronic obstructive pulmonary disease, unspecified: Secondary | ICD-10-CM | POA: Diagnosis not present

## 2017-09-30 NOTE — Progress Notes (Signed)
Remote ICD transmission.   

## 2017-10-02 ENCOUNTER — Encounter: Payer: Self-pay | Admitting: Cardiology

## 2017-10-02 DIAGNOSIS — I5022 Chronic systolic (congestive) heart failure: Secondary | ICD-10-CM | POA: Diagnosis not present

## 2017-10-02 DIAGNOSIS — Z48812 Encounter for surgical aftercare following surgery on the circulatory system: Secondary | ICD-10-CM | POA: Diagnosis not present

## 2017-10-02 DIAGNOSIS — M6281 Muscle weakness (generalized): Secondary | ICD-10-CM | POA: Diagnosis not present

## 2017-10-02 DIAGNOSIS — I110 Hypertensive heart disease with heart failure: Secondary | ICD-10-CM | POA: Diagnosis not present

## 2017-10-02 DIAGNOSIS — J449 Chronic obstructive pulmonary disease, unspecified: Secondary | ICD-10-CM | POA: Diagnosis not present

## 2017-10-02 DIAGNOSIS — F17210 Nicotine dependence, cigarettes, uncomplicated: Secondary | ICD-10-CM | POA: Diagnosis not present

## 2017-10-04 DIAGNOSIS — J449 Chronic obstructive pulmonary disease, unspecified: Secondary | ICD-10-CM | POA: Diagnosis not present

## 2017-10-04 DIAGNOSIS — M6281 Muscle weakness (generalized): Secondary | ICD-10-CM | POA: Diagnosis not present

## 2017-10-04 DIAGNOSIS — F17210 Nicotine dependence, cigarettes, uncomplicated: Secondary | ICD-10-CM | POA: Diagnosis not present

## 2017-10-04 DIAGNOSIS — Z48812 Encounter for surgical aftercare following surgery on the circulatory system: Secondary | ICD-10-CM | POA: Diagnosis not present

## 2017-10-04 DIAGNOSIS — I110 Hypertensive heart disease with heart failure: Secondary | ICD-10-CM | POA: Diagnosis not present

## 2017-10-04 DIAGNOSIS — I5022 Chronic systolic (congestive) heart failure: Secondary | ICD-10-CM | POA: Diagnosis not present

## 2017-10-07 DIAGNOSIS — M6281 Muscle weakness (generalized): Secondary | ICD-10-CM | POA: Diagnosis not present

## 2017-10-07 DIAGNOSIS — I110 Hypertensive heart disease with heart failure: Secondary | ICD-10-CM | POA: Diagnosis not present

## 2017-10-07 DIAGNOSIS — J449 Chronic obstructive pulmonary disease, unspecified: Secondary | ICD-10-CM | POA: Diagnosis not present

## 2017-10-07 DIAGNOSIS — I5022 Chronic systolic (congestive) heart failure: Secondary | ICD-10-CM | POA: Diagnosis not present

## 2017-10-07 DIAGNOSIS — Z48812 Encounter for surgical aftercare following surgery on the circulatory system: Secondary | ICD-10-CM | POA: Diagnosis not present

## 2017-10-07 DIAGNOSIS — F17210 Nicotine dependence, cigarettes, uncomplicated: Secondary | ICD-10-CM | POA: Diagnosis not present

## 2017-10-09 DIAGNOSIS — I5022 Chronic systolic (congestive) heart failure: Secondary | ICD-10-CM | POA: Diagnosis not present

## 2017-10-09 DIAGNOSIS — Z48812 Encounter for surgical aftercare following surgery on the circulatory system: Secondary | ICD-10-CM | POA: Diagnosis not present

## 2017-10-09 DIAGNOSIS — J449 Chronic obstructive pulmonary disease, unspecified: Secondary | ICD-10-CM | POA: Diagnosis not present

## 2017-10-09 DIAGNOSIS — F17210 Nicotine dependence, cigarettes, uncomplicated: Secondary | ICD-10-CM | POA: Diagnosis not present

## 2017-10-09 DIAGNOSIS — M6281 Muscle weakness (generalized): Secondary | ICD-10-CM | POA: Diagnosis not present

## 2017-10-09 DIAGNOSIS — I110 Hypertensive heart disease with heart failure: Secondary | ICD-10-CM | POA: Diagnosis not present

## 2017-10-11 ENCOUNTER — Ambulatory Visit (INDEPENDENT_AMBULATORY_CARE_PROVIDER_SITE_OTHER): Payer: Medicare Other | Admitting: Internal Medicine

## 2017-10-11 ENCOUNTER — Encounter: Payer: Self-pay | Admitting: Internal Medicine

## 2017-10-11 VITALS — BP 128/68 | HR 76 | Ht 63.0 in | Wt 159.0 lb

## 2017-10-11 DIAGNOSIS — Z95810 Presence of automatic (implantable) cardiac defibrillator: Secondary | ICD-10-CM

## 2017-10-11 DIAGNOSIS — F17210 Nicotine dependence, cigarettes, uncomplicated: Secondary | ICD-10-CM | POA: Diagnosis not present

## 2017-10-11 DIAGNOSIS — I255 Ischemic cardiomyopathy: Secondary | ICD-10-CM

## 2017-10-11 DIAGNOSIS — I110 Hypertensive heart disease with heart failure: Secondary | ICD-10-CM | POA: Diagnosis not present

## 2017-10-11 DIAGNOSIS — I2589 Other forms of chronic ischemic heart disease: Secondary | ICD-10-CM

## 2017-10-11 DIAGNOSIS — J449 Chronic obstructive pulmonary disease, unspecified: Secondary | ICD-10-CM | POA: Diagnosis not present

## 2017-10-11 DIAGNOSIS — M6281 Muscle weakness (generalized): Secondary | ICD-10-CM | POA: Diagnosis not present

## 2017-10-11 DIAGNOSIS — I5022 Chronic systolic (congestive) heart failure: Secondary | ICD-10-CM | POA: Diagnosis not present

## 2017-10-11 DIAGNOSIS — Z48812 Encounter for surgical aftercare following surgery on the circulatory system: Secondary | ICD-10-CM | POA: Diagnosis not present

## 2017-10-11 NOTE — Patient Instructions (Signed)
Medication Instructions:  Your physician recommends that you continue on your current medications as directed. Please refer to the Current Medication list given to you today.   Labwork: NONE   Testing/Procedures: NONE   Follow-Up: Your physician wants you to follow-up in: 1 Year with Dr. Lovena Le. You will receive a reminder letter in the mail two months in advance. If you don't receive a letter, please call our office to schedule the follow-up appointment.  Remote monitoring is used to monitor your Pacemaker of ICD from home. This monitoring reduces the number of office visits required to check your device to one time per year. It allows [place] to keep an eye on the functioning of your device to ensure it is working properly. You are scheduled for a device check from home on 12/30/17. You may send your transmission at any time that day. If you have a wireless device, the transmission will be sent automatically. After your physician reviews your transmission, you will receive a postcard with your next transmission date.    Any Other Special Instructions Will Be Listed Below (If Applicable).     If you need a refill on your cardiac medications before your next appointment, please call your pharmacy. Thank you for choosing [facility]!

## 2017-10-11 NOTE — Progress Notes (Signed)
HPI Roy [name] returns today for ongoing evaluation of his ICD, chronic systolic heart failure, coronary artery disease, and COPD. In the interim, he has been bothered by peripheral vascular disease and has undergone revascularization. His feet and legs are much better. His claudication is much improved. Unfortunately he continues to smoke cigarettes, one pack per day. He denies anginal symptoms, shortness of breath, syncope, or any ICD shock. No Known Allergies   Current Outpatient Medications  Medication Sig Dispense Refill  . acetaminophen (TYLENOL) 500 MG tablet Take 500 mg by mouth daily as needed for moderate pain.    [name] aspirin EC 81 MG tablet Take 81 mg by mouth every evening.     [name] atorvastatin (LIPITOR) 20 MG tablet Take 20 mg by mouth daily.     . carvedilol (COREG) 3.125 MG tablet TAKE 1 TABLET TWICE A DAY 180 tablet 0  . clopidogrel (PLAVIX) 75 MG tablet Take 75 mg by mouth every evening.     [name] dextromethorphan-guaiFENesin (MUCINEX DM) 30-600 MG 12hr tablet Take 1 tablet by mouth daily.    [name] ENTRESTO 49-51 MG TAKE 1 TABLET TWICE A DAY 180 tablet 3  . Flax Oil-Fish Oil-Borage Oil (FISH OIL-FLAX OIL-BORAGE OIL) CAPS Take 1 capsule by mouth daily.    . folic acid (FOLVITE) 992 MCG tablet Take 800 mcg by mouth daily.    [name] gabapentin (NEURONTIN) 300 MG capsule Take 300 mg by mouth 2 (two) times daily.     . Iron-FA-B Cmp-C-Biot-Probiotic (FUSION PLUS) CAPS Take 1 capsule by mouth daily.     . Multiple Vitamins-Minerals (CENTRUM SILVER PO) Take 1 tablet by mouth daily.     [name] oxyCODONE (OXY IR/ROXICODONE) 5 MG immediate release tablet     . pantoprazole (PROTONIX) 40 MG tablet TAKE 1 TABLET DAILY (Patient taking differently: TAKE 40 mg by mouth DAILY) 90 tablet 3  . PARoxetine (PAXIL) 20 MG tablet Take 20 mg by mouth daily.    . potassium chloride SA (K-DUR,KLOR-CON) 20 MEQ tablet Take 20 mEq by mouth daily.    . SUPER B COMPLEX/C PO Take 1 tablet by mouth daily.    . tamsulosin  (FLOMAX) 0.4 MG CAPS capsule Take 0.4 mg by mouth daily.     No current facility-administered medications for this visit.      Past Medical History:  Diagnosis Date  . AICD (automatic cardioverter/defibrillator) present 08/19/2015   St Jude BiV ICD for primary prevention by Dr. Lovena Le  . Alcohol abuse    6 beers per day; hospital admission in 2009 for withdrawal symptoms  . Alcoholic cirrhosis ([place])   . Anxiety and depression   . Cerebrovascular disease 2009   TIA; 2009- right ICA stent; re-intervention for restenosis complicated by [facility] w/o sx  . CHF (congestive heart failure) ([place]) 06-02-14  . Chronic obstructive pulmonary disease ([place])   . Degenerative joint disease    Total shoulder arthroplasty-right  . Hyperlipidemia   . Hypertension   . LBBB (left bundle branch block)    Normal echo-2011; stress nuclear in 09/2010--septal hypoperfusion representing nontransmural infarction or the effect of left bundle branch block, no ischemia  . Myocardial infarction [facility]) June 02, 2014   Massive Heart Attack  . Peripheral vascular disease ([employer])   . 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  . Thrombocytopenia ([place])   . Tobacco abuse    -100 pack years; 1.5 packs per day  . Traumatic seroma of thigh    left  .  Tubular adenoma of colon     ROS:   All systems reviewed and negative except as noted in the HPI.   Past Surgical History:  Procedure Laterality Date  . ABDOMINAL AORTAGRAM [name] 05/04/2014   Procedure: ABDOMINAL Maxcine Ham;  Surgeon: [name], MD;  Location: [facility] CATH LAB;  Service: Cardiovascular;  Laterality: [name];  . ABDOMINAL AORTOGRAM [name] 06/25/2017   Procedure: Abdominal Aortogram;  Surgeon: [name], MD;  Location: [place] CV LAB;  Service: Cardiovascular;  Laterality: [name];  . AGILE CAPSULE [name] 03/18/2014   Procedure: AGILE CAPSULE;  Surgeon: Danie Binder, MD;  [facility];  Service: Endoscopy;  Laterality: [name];  7:30  . APPLICATION OF  WOUND VAC Left 09/03/2017   Procedure: APPLICATION OF WOUND VAC;  Surgeon: Conrad Peekskill, MD;  Location: [facility];  Service: Vascular;  Laterality: Left;  . BACK SURGERY    . BACTERIAL OVERGROWTH TEST [name] 05/24/2015   Procedure: BACTERIAL OVERGROWTH TEST;  Surgeon: Danie Binder, MD;  [facility];  Service: Endoscopy;  Laterality: [name];  0700  . BI-VENTRICULAR IMPLANTABLE CARDIOVERTER DEFIBRILLATOR  (CRT-D)  08/19/2015  . CARPAL TUNNEL RELEASE Left 02/02/2016   Procedure: LEFT CARPAL TUNNEL RELEASE;  Surgeon: Daryll Brod, MD;  Location: [place];  Service: Orthopedics;  Laterality: Left;  ANESTHESIA: IV REGIONAL UPPER ARM  . CATARACT EXTRACTION W/PHACO Right 03/08/2015   Procedure: CATARACT EXTRACTION PHACO AND INTRAOCULAR LENS PLACEMENT (IOC);  Surgeon: Rutherford Guys, MD;  Location: [facility] OR[facility];  Service: Ophthalmology;  Laterality: Right;  CDE:9.46  . CATARACT EXTRACTION W/PHACO Left 03/22/2015   Procedure: CATARACT EXTRACTION PHACO AND INTRAOCULAR LENS PLACEMENT (IOC);  Surgeon: Rutherford Guys, MD;  Location: [facility];  Service: Ophthalmology;  Laterality: Left;  CDE:5.80  . COLONOSCOPY  08/22/09   Fields-(Tubular Adenoma)3-mm transverse polyp/4-mm polyp otherwise noraml/small internal hemorrhoids  . COLONOSCOPY [name] 04/14/2014   YBO:FBPZW internal hemorrhids/normal mocsa in the terminal iluem/left colonis redundant  . COLONOSCOPY W/ POLYPECTOMY  2011  . ENDARTERECTOMY FEMORAL Left 08/16/2017   Procedure: ENDARTERECTOMY LEFT PROFUNDA FEMORAL;  Surgeon: [name], MD;  Location: [place];  Service: Vascular;  Laterality: Left;  . EP IMPLANTABLE DEVICE [name] 08/19/2015   Procedure: BiV ICD Insertion CRT-D;  Surgeon: Evans Lance, MD;  Location: [place] CV LAB;  Service: Cardiovascular;  Laterality: [name];  . ESOPHAGOGASTRODUODENOSCOPY [name] 03/05/2014   SLF: 1. Stricture at the gastroesophagael junction 2. Small hiatal hernia 3. Moderate non-erosive gastritis and duodentitis. 4. No surce  for Melena identified.   . FEMORAL-POPLITEAL BYPASS GRAFT Left 08/16/2017   Procedure: LEFT  FEMORAL-POPLITEAL ARTERY  BYPASS GRAFT;  Surgeon: [name], MD;  Location: [place];  Service: Vascular;  Laterality: Left;  . GIVENS CAPSULE STUDY [name] 03/30/2014   Procedure: GIVENS CAPSULE STUDY;  Surgeon: Danie Binder, MD;  [facility];  Service: Endoscopy;  Laterality: [name];  7:30  . I&D EXTREMITY Left 09/03/2017   Procedure: IRRIGATION AND DEBRIDEMENT EXTREMITY LEFT THIGH SEROMA;  Surgeon: Conrad Ridgeside, MD;  [facility];  Service: Vascular;  Laterality: Left;  . IR ANGIO INTRA EXTRACRAN SEL COM CAROTID INNOMINATE BILAT MOD SED  07/16/2017  . IR ANGIO VERTEBRAL SEL SUBCLAVIAN INNOMINATE BILAT MOD SED  07/16/2017  . IR RADIOLOGIST EVAL & MGMT  04/01/2017  . IR RADIOLOGIST EVAL & MGMT  08/02/2017  . JOINT REPLACEMENT Right   . LOWER EXTREMITY ANGIOGRAPHY Bilateral 06/25/2017   Procedure: Lower Extremity Angiography;  Surgeon: [name], MD;  Location: [place] CV LAB;  Service: Cardiovascular;  Laterality: Bilateral;  . LUMBAR FUSION  2010  . TOTAL SHOULDER ARTHROPLASTY Right 2011   Dr. Tamera Punt  . ULNAR NERVE TRANSPOSITION Left 02/02/2016   Procedure: LEFT IN-SITU DECOMPRESSION ULNAR NERVE ;  Surgeon: Daryll Brod, MD;  Location: [place];  Service: Orthopedics;  Laterality: Left;  . ULNAR TUNNEL RELEASE Left 02/02/2016   Procedure: LEFT CUBITAL TUNNEL RELEASE;  Surgeon: Daryll Brod, MD;  Location: [place];  Service: Orthopedics;  Laterality: Left;  [name] VASECTOMY  1971  . VEIN HARVEST Left 08/16/2017   Procedure: USING NON REVERSE LEFT GREATER SAPHENOUS VEIN HARVEST;  Surgeon: [name], MD;  Location: [facility] OR;  Service: Vascular;  Laterality: Left;     Family History  Problem Relation Age of Onset  . Coronary artery disease Mother   . Diabetes Mother   . Heart disease Mother        Before age 33 - 79 Bypasses  . Hypertension Mother   .  Heart attack Mother        3-4 Heart attacks  . Alzheimer's disease Father   . Diabetes Father   . Heart attack Sister   . Cancer Brother        Prostate  . Hyperlipidemia Son   . Prostate cancer Brother   . Alzheimer's disease Sister   . Colon cancer Neg Hx      Social History   Socioeconomic History  . Marital status: Married    Spouse name: Not on file  . Number of children: 2  . Years of education: Not on file  . Highest education level: Not on file  Social Needs  . Financial resource strain: Not on file  . Food insecurity - worry: Not on file  . Food insecurity - inability: Not on file  . Transportation needs - medical: Not on file  . Transportation needs - non-medical: Not on file  Occupational History  . Occupation: retired, Clinical biochemist    Comment: Programmer, systems: RETIRED  Tobacco Use  . Smoking status: Current Every Day Smoker    Packs/day: 1.00    Years: 60.00    Pack years: 60.00    Types: Cigarettes    Start date: 08/20/1957  . Smokeless tobacco: Never Used  Substance and Sexual Activity  . Alcohol use: No    Alcohol/week: 0.0 oz    Comment: quit in July 2015; No ETOH to date 02/14/15; no ETOH to date 08/13/17  . Drug use: No  . Sexual activity: No  Other Topics Concern  . Not on file  Social History Narrative   Accompanied by daughter [name]   Lives w/ wife, daughter     BP 128/68   Pulse 76   Ht 5\' 3"  (1.6 m)   Wt 159 lb (72.1 kg)   SpO2 99%   BMI 28.17 kg/m   Physical Exam:  Well appearing NAD HEENT: Unremarkable Neck:  No JVD, no thyromegally Lymphatics:  No adenopathy Back:  No CVA tenderness Lungs:  Clear, with no wheezes, rales, or rhonchi HEART:  Regular rate rhythm, no murmurs, no rubs, no clicks Abd:  soft, positive bowel sounds, no organomegally, no rebound, no guarding Ext:  2 plus pulses, no edema, no cyanosis, no clubbing Skin:  No rashes no nodules Neuro:  CN II through XII intact, motor grossly  intact  DEVICE  Normal device function.  See PaceArt for details.   Assess/Plan: 1. Chronic systolic heart failure - his symptoms are class II. He will continue his current medical therapy, and maintain a low-sodium diet. 2. ICD - his St. Jude biventricular ICD has been interrogated today. He has had a little bit of noise reversion demonstrated but no atrial fibrillation, and no ICD therapies. He has approximately 5 years of battery longevity. 3. Tobacco abuse - he continues to smoke. In the next year, I've asked the patient to reduce his cigarette consumption and half. We discussed ways that he might do this. He cannot afford Chantix. 4. Coronary artery disease. - As he has undergone peripheral vascular treatment, his claudication is much improved. He is encouraged to increase his physical activity and he will continue his current medications.  Cristopher Peru, M.D.

## 2017-10-14 DIAGNOSIS — I110 Hypertensive heart disease with heart failure: Secondary | ICD-10-CM | POA: Diagnosis not present

## 2017-10-14 DIAGNOSIS — J449 Chronic obstructive pulmonary disease, unspecified: Secondary | ICD-10-CM | POA: Diagnosis not present

## 2017-10-14 DIAGNOSIS — M6281 Muscle weakness (generalized): Secondary | ICD-10-CM | POA: Diagnosis not present

## 2017-10-14 DIAGNOSIS — F17210 Nicotine dependence, cigarettes, uncomplicated: Secondary | ICD-10-CM | POA: Diagnosis not present

## 2017-10-14 DIAGNOSIS — Z48812 Encounter for surgical aftercare following surgery on the circulatory system: Secondary | ICD-10-CM | POA: Diagnosis not present

## 2017-10-14 DIAGNOSIS — I5022 Chronic systolic (congestive) heart failure: Secondary | ICD-10-CM | POA: Diagnosis not present

## 2017-10-14 LAB — CUP PACEART INCLINIC DEVICE CHECK
Battery Remaining Longevity: 57 mo
Brady Statistic RA Percent Paced: 23 %
Date Time Interrogation Session: 20181116142220
HighPow Impedance: 80 Ohm
Implantable Lead Implant Date: 20160923
Implantable Lead Implant Date: 20160923
Implantable Lead Location: 753858
Implantable Lead Location: 753860
Lead Channel Pacing Threshold Amplitude: 0.75 V
Lead Channel Pacing Threshold Pulse Width: 0.5 ms
Lead Channel Pacing Threshold Pulse Width: 0.8 ms
Lead Channel Sensing Intrinsic Amplitude: 12 mV
Lead Channel Sensing Intrinsic Amplitude: 5 mV
Lead Channel Setting Pacing Amplitude: 2 V
Lead Channel Setting Pacing Pulse Width: 0.5 ms
Lead Channel Setting Pacing Pulse Width: 0.8 ms
Lead Channel Setting Sensing Sensitivity: 0.5 mV
MDC IDC LEAD IMPLANT DT: 20160923
MDC IDC LEAD LOCATION: 753859
MDC IDC MSMT LEADCHNL LV IMPEDANCE VALUE: 437.5 Ohm
MDC IDC MSMT LEADCHNL LV PACING THRESHOLD AMPLITUDE: 1 V
MDC IDC MSMT LEADCHNL RA IMPEDANCE VALUE: 487.5 Ohm
MDC IDC MSMT LEADCHNL RV IMPEDANCE VALUE: 762.5 Ohm
MDC IDC MSMT LEADCHNL RV PACING THRESHOLD AMPLITUDE: 1 V
MDC IDC MSMT LEADCHNL RV PACING THRESHOLD PULSEWIDTH: 0.5 ms
MDC IDC PG IMPLANT DT: 20160923
MDC IDC PG SERIAL: 7294918
MDC IDC SET LEADCHNL LV PACING AMPLITUDE: 2.25 V
MDC IDC SET LEADCHNL RV PACING AMPLITUDE: 2 V
MDC IDC STAT BRADY RV PERCENT PACED: 99.03 %

## 2017-10-15 LAB — CUP PACEART REMOTE DEVICE CHECK
Battery Remaining Longevity: 58 mo
Brady Statistic AP VS Percent: 1 %
Brady Statistic AS VS Percent: 1 %
Brady Statistic RA Percent Paced: 23 %
HIGH POWER IMPEDANCE MEASURED VALUE: 84 Ohm
HIGH POWER IMPEDANCE MEASURED VALUE: 84 Ohm
Implantable Lead Implant Date: 20160923
Implantable Lead Implant Date: 20160923
Implantable Lead Location: 753858
Implantable Lead Location: 753860
Implantable Pulse Generator Implant Date: 20160923
Lead Channel Impedance Value: 740 Ohm
Lead Channel Pacing Threshold Amplitude: 0.75 V
Lead Channel Pacing Threshold Amplitude: 1 V
Lead Channel Pacing Threshold Pulse Width: 0.5 ms
Lead Channel Setting Pacing Amplitude: 2.25 V
Lead Channel Setting Sensing Sensitivity: 0.5 mV
MDC IDC LEAD IMPLANT DT: 20160923
MDC IDC LEAD LOCATION: 753859
MDC IDC MSMT BATTERY REMAINING PERCENTAGE: 71 %
MDC IDC MSMT BATTERY VOLTAGE: 2.96 V
MDC IDC MSMT LEADCHNL LV IMPEDANCE VALUE: 410 Ohm
MDC IDC MSMT LEADCHNL LV PACING THRESHOLD PULSEWIDTH: 0.8 ms
MDC IDC MSMT LEADCHNL RA IMPEDANCE VALUE: 490 Ohm
MDC IDC MSMT LEADCHNL RA PACING THRESHOLD AMPLITUDE: 0.75 V
MDC IDC MSMT LEADCHNL RA SENSING INTR AMPL: 5 mV
MDC IDC MSMT LEADCHNL RV PACING THRESHOLD PULSEWIDTH: 0.5 ms
MDC IDC MSMT LEADCHNL RV SENSING INTR AMPL: 12 mV
MDC IDC SESS DTM: 20181105060016
MDC IDC SET LEADCHNL LV PACING PULSEWIDTH: 0.8 ms
MDC IDC SET LEADCHNL RA PACING AMPLITUDE: 2 V
MDC IDC SET LEADCHNL RV PACING AMPLITUDE: 2 V
MDC IDC SET LEADCHNL RV PACING PULSEWIDTH: 0.5 ms
MDC IDC STAT BRADY AP VP PERCENT: 22 %
MDC IDC STAT BRADY AS VP PERCENT: 77 %
Pulse Gen Serial Number: 7294918

## 2017-11-27 DIAGNOSIS — M25511 Pain in right shoulder: Secondary | ICD-10-CM | POA: Diagnosis not present

## 2017-11-27 DIAGNOSIS — Z6830 Body mass index (BMI) 30.0-30.9, adult: Secondary | ICD-10-CM | POA: Diagnosis not present

## 2017-11-27 DIAGNOSIS — M542 Cervicalgia: Secondary | ICD-10-CM | POA: Diagnosis not present

## 2017-12-09 DIAGNOSIS — D509 Iron deficiency anemia, unspecified: Secondary | ICD-10-CM | POA: Diagnosis not present

## 2017-12-09 DIAGNOSIS — E782 Mixed hyperlipidemia: Secondary | ICD-10-CM | POA: Diagnosis not present

## 2017-12-09 DIAGNOSIS — N401 Enlarged prostate with lower urinary tract symptoms: Secondary | ICD-10-CM | POA: Diagnosis not present

## 2017-12-09 DIAGNOSIS — I10 Essential (primary) hypertension: Secondary | ICD-10-CM | POA: Diagnosis not present

## 2017-12-11 DIAGNOSIS — Z720 Tobacco use: Secondary | ICD-10-CM | POA: Diagnosis not present

## 2017-12-11 DIAGNOSIS — I739 Peripheral vascular disease, unspecified: Secondary | ICD-10-CM | POA: Diagnosis not present

## 2017-12-11 DIAGNOSIS — I5042 Chronic combined systolic (congestive) and diastolic (congestive) heart failure: Secondary | ICD-10-CM | POA: Diagnosis not present

## 2017-12-11 DIAGNOSIS — Z6829 Body mass index (BMI) 29.0-29.9, adult: Secondary | ICD-10-CM | POA: Diagnosis not present

## 2017-12-11 DIAGNOSIS — E782 Mixed hyperlipidemia: Secondary | ICD-10-CM | POA: Diagnosis not present

## 2017-12-11 DIAGNOSIS — R7301 Impaired fasting glucose: Secondary | ICD-10-CM | POA: Diagnosis not present

## 2017-12-11 DIAGNOSIS — G8929 Other chronic pain: Secondary | ICD-10-CM | POA: Diagnosis not present

## 2017-12-11 DIAGNOSIS — D509 Iron deficiency anemia, unspecified: Secondary | ICD-10-CM | POA: Diagnosis not present

## 2017-12-11 DIAGNOSIS — J449 Chronic obstructive pulmonary disease, unspecified: Secondary | ICD-10-CM | POA: Diagnosis not present

## 2017-12-11 DIAGNOSIS — I10 Essential (primary) hypertension: Secondary | ICD-10-CM | POA: Diagnosis not present

## 2017-12-23 ENCOUNTER — Encounter: Payer: Self-pay | Admitting: Surgery

## 2017-12-23 ENCOUNTER — Ambulatory Visit (INDEPENDENT_AMBULATORY_CARE_PROVIDER_SITE_OTHER)
Admission: RE | Admit: 2017-12-23 | Discharge: 2017-12-23 | Disposition: A | Discharge status: Home / Self Care | Payer: Medicare Other | Source: Ambulatory Visit | Attending: Surgery | Admitting: Surgery

## 2017-12-23 ENCOUNTER — Ambulatory Visit (HOSPITAL_COMMUNITY)
Admission: RE | Admit: 2017-12-23 | Discharge: 2017-12-23 | Disposition: A | Discharge status: Home / Self Care | Payer: Medicare Other | Source: Ambulatory Visit | Attending: Surgery | Admitting: Surgery

## 2017-12-23 ENCOUNTER — Ambulatory Visit (INDEPENDENT_AMBULATORY_CARE_PROVIDER_SITE_OTHER): Payer: Medicare Other | Admitting: Surgery

## 2017-12-23 VITALS — BP 148/72 | HR 61 | Temp 97.0°F | Resp 16 | Ht 63.0 in | Wt 164.0 lb

## 2017-12-23 DIAGNOSIS — I70213 Atherosclerosis of native arteries of extremities with intermittent claudication, bilateral legs: Secondary | ICD-10-CM

## 2017-12-23 DIAGNOSIS — E785 Hyperlipidemia, unspecified: Secondary | ICD-10-CM | POA: Diagnosis not present

## 2017-12-23 DIAGNOSIS — F17200 Nicotine dependence, unspecified, uncomplicated: Secondary | ICD-10-CM | POA: Insufficient documentation

## 2017-12-23 DIAGNOSIS — I10 Essential (primary) hypertension: Secondary | ICD-10-CM | POA: Diagnosis not present

## 2017-12-23 NOTE — Progress Notes (Signed)
[facility]ist [facility] [place]  Patient name: Roy [name] MRN: [id number] DOB: 06-14-1946 Sex: male   REASON FOR VISIT:    Follow up  HISOTRY OF PRESENT ILLNESS:    This is a 72 year old gentleman who returns today for follow-up of his claudication symptoms. He is status post bilateral iliac stenting in 2015. This was done for lifestyle limiting claudication. An 8 x 40 self extending stent was placed on the left and 8 x 80 on the right. He had moderate left superficial femoral artery stenosis within the adductor canal and right common femoral artery stenosis. The patient had been doing well up until about 3 months ago. At that time he developed claudication symptoms at 100 feet that were alleviated with rest. He initially has pain in his calf but then this progresses to his hips.  On 08/16/2017 he underwent a left femoral to above-knee popliteal artery bypass graft with ipsilateral non-reversed greater saphenous vein as well as the extensive profunda femoral endarterectomy.  He did well initially however developed a fluid collection around the distal incision, and went back for I and D on 09/03/2017 with wound VAC placement.  His biggest complaints are that of left leg swelling, burning in his incisions, and itching in the calf.  He does use Eucerin cream which helps with his itching.  His claudication symptoms have gotten much better.  However, he states that if he tries to walk very fast or very far he will get pain in his right leg which ultimately goes into his hips  PAST MEDICAL HISTORY:   Past Medical History:  Diagnosis Date  . AICD (automatic cardioverter/defibrillator) present 08/19/2015   St Jude BiV ICD for primary prevention by Dr. Lovena Le  . Alcohol abuse    6 beers per day; hospital admission in 2009 for withdrawal symptoms  . Alcoholic cirrhosis ([place])   . Anxiety and depression   . Cerebrovascular disease 2009   TIA; 2009- right ICA stent; re-intervention for restenosis complicated by [facility] w/o sx  . CHF (congestive heart failure) ([place]) 06-02-14  . Chronic obstructive pulmonary disease ([place])   . Degenerative joint disease    Total shoulder arthroplasty-right  . Hyperlipidemia   . Hypertension   . LBBB (left bundle branch block)    Normal echo-2011; stress nuclear in 09/2010--septal hypoperfusion representing nontransmural infarction or the effect of left bundle branch block, no ischemia  . Myocardial infarction [facility]) June 02, 2014   Massive Heart Attack  . Peripheral vascular disease ([place])   . 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  . Thrombocytopenia ([place])   . Tobacco abuse    -100 pack years; 1.5 packs per day  . Traumatic seroma of thigh    left  . Tubular adenoma of colon      FAMILY HISTORY:   Family History  Problem Relation Age of Onset  . Coronary artery disease Mother   . Diabetes Mother   . Heart disease Mother        Before age 48 - 20 Bypasses  . Hypertension Mother   . Heart attack Mother        3-4 Heart attacks  . Alzheimer's disease Father   . Diabetes Father   . Heart attack Sister   . Cancer Brother        Prostate  . Hyperlipidemia Son   . Prostate cancer Brother   . Alzheimer's disease Sister   . Colon cancer Neg Hx     SOCIAL  HISTORY:   Social History   Tobacco Use  . Smoking status: Current Every Day Smoker    Packs/day: 1.00    Years: 60.00    Pack years: 60.00    Types: Cigarettes    Start date: 08/20/1957  . Smokeless tobacco: Never Used  Substance Use Topics  . Alcohol use: No    Alcohol/week: 0.0 oz    Comment: quit in July 2015; No ETOH to date 02/14/15; no ETOH to date 08/13/17     ALLERGIES:   No Known Allergies   CURRENT MEDICATIONS:   Current Outpatient Medications  Medication Sig Dispense Refill  . acetaminophen (TYLENOL) 500 MG tablet Take 500 mg by mouth daily as needed for moderate pain.    [name] aspirin EC 81 MG  tablet Take 81 mg by mouth every evening.     [name] atorvastatin (LIPITOR) 20 MG tablet Take 20 mg by mouth daily.     . carvedilol (COREG) 3.125 MG tablet TAKE 1 TABLET TWICE A DAY 180 tablet 0  . clopidogrel (PLAVIX) 75 MG tablet Take 75 mg by mouth every evening.     [name] dextromethorphan-guaiFENesin (MUCINEX DM) 30-600 MG 12hr tablet Take 1 tablet by mouth daily.    [name] ENTRESTO 49-51 MG TAKE 1 TABLET TWICE A DAY 180 tablet 3  . Flax Oil-Fish Oil-Borage Oil (FISH OIL-FLAX OIL-BORAGE OIL) CAPS Take 1 capsule by mouth daily.    . folic acid (FOLVITE) 109 MCG tablet Take 800 mcg by mouth daily.    [name] gabapentin (NEURONTIN) 300 MG capsule Take 300 mg by mouth 2 (two) times daily.     . Multiple Vitamins-Minerals (CENTRUM SILVER PO) Take 1 tablet by mouth daily.     . pantoprazole (PROTONIX) 40 MG tablet TAKE 1 TABLET DAILY (Patient taking differently: TAKE 40 mg by mouth DAILY) 90 tablet 3  . PARoxetine (PAXIL) 20 MG tablet Take 20 mg by mouth daily.    . potassium chloride SA (K-DUR,KLOR-CON) 20 MEQ tablet Take 20 mEq by mouth daily.    . SUPER B COMPLEX/C PO Take 1 tablet by mouth daily.    . tamsulosin (FLOMAX) 0.4 MG CAPS capsule Take 0.4 mg by mouth daily.     No current facility-administered medications for this visit.     REVIEW OF SYSTEMS:   [X]  denotes positive finding, [ ]  denotes negative finding Cardiac  Comments:  Chest pain or chest pressure:    Shortness of breath upon exertion:    Short of breath when lying flat:    Irregular heart rhythm:        Vascular    Pain in calf, thigh, or hip brought on by ambulation: x   Pain in feet at night that wakes you up from your sleep:     Blood clot in your veins:    Leg swelling:         Pulmonary    Oxygen at home:    Productive cough:     Wheezing:         Neurologic    Sudden weakness in arms or legs:     Sudden numbness in arms or legs:     Sudden onset of difficulty speaking or slurred speech:    Temporary loss of vision in  one eye:     Problems with dizziness:         Gastrointestinal    Blood in stool:     Vomited blood:  Genitourinary    Burning when urinating:     Blood in urine:        Psychiatric    Major depression:         Hematologic    Bleeding problems:    Problems with blood clotting too easily:        Skin    Rashes or ulcers:        Constitutional    Fever or chills:      PHYSICAL EXAM:   Vitals:   12/23/17 1124  BP: (!) 148/72  Pulse: 61  Resp: 16  Temp: (!) 97 F (36.1 C)  TempSrc: Oral  SpO2: 96%  Weight: 164 lb (74.4 kg)  Height: 5\' 3"  (1.6 m)    GENERAL: The patient is a well-nourished male, in no acute distress. The vital signs are documented above. CARDIAC: There is a regular rate and rhythm.  VASCULAR: Palpable left dorsalis pedis pulse.  All surgical incisions are healed.  1+ edema to the left leg PULMONARY: Non-labored respirations MUSCULOSKELETAL: There are no major deformities or cyanosis. NEUROLOGIC: No focal weakness or paresthesias are detected. SKIN: There are no ulcers or rashes noted. PSYCHIATRIC: The patient has a normal affect.  STUDIES:   I have ordered and reviewed his duplex.  This shows a widely patent left leg bypass graft with no evidence of stenosis.  MEDICAL ISSUES:   Claudication: The patient's left leg bypass is widely patent.  I do not feel that the right leg symptoms are severe enough to warrant intervention.  He potentially could be a candidate for atherectomy of the right superficial femoral artery if this symptoms progress.  The patient will be brought back in 3 months for a duplex of the aortoiliac section to focus on his iliac stents and then a left leg duplex for his bypass graft.    Annamarie Major, MD [facility] Specialists of [facility] [phone] Pager [phone]

## 2017-12-24 NOTE — Addendum Note (Signed)
Addended by: Lianne Cure A on: 12/24/2017 03:13 PM   Modules accepted: Orders

## 2017-12-26 ENCOUNTER — Other Ambulatory Visit: Payer: Self-pay | Admitting: Internal Medicine

## 2017-12-30 ENCOUNTER — Ambulatory Visit (INDEPENDENT_AMBULATORY_CARE_PROVIDER_SITE_OTHER): Payer: Medicare Other | Admitting: *Deleted

## 2017-12-30 DIAGNOSIS — I255 Ischemic cardiomyopathy: Secondary | ICD-10-CM | POA: Diagnosis not present

## 2017-12-30 DIAGNOSIS — I5022 Chronic systolic (congestive) heart failure: Secondary | ICD-10-CM

## 2017-12-30 NOTE — Progress Notes (Signed)
Remote ICD transmission.   

## 2017-12-31 ENCOUNTER — Encounter: Payer: Self-pay | Admitting: Cardiology

## 2018-01-14 LAB — CUP PACEART REMOTE DEVICE CHECK
Battery Voltage: 2.95 V
Brady Statistic AP VP Percent: 9.7 %
Brady Statistic AP VS Percent: 1 %
Brady Statistic AS VP Percent: 89 %
Brady Statistic RA Percent Paced: 9.6 %
Date Time Interrogation Session: 20190204080317
HighPow Impedance: 81 Ohm
HighPow Impedance: 81 Ohm
Implantable Lead Implant Date: 20160923
Implantable Lead Implant Date: 20160923
Implantable Lead Location: 753858
Implantable Lead Location: 753859
Implantable Lead Location: 753860
Implantable Lead Model: 7122
Lead Channel Impedance Value: 440 Ohm
Lead Channel Impedance Value: 480 Ohm
Lead Channel Pacing Threshold Amplitude: 0.75 V
Lead Channel Pacing Threshold Amplitude: 0.875 V
Lead Channel Pacing Threshold Amplitude: 1 V
Lead Channel Pacing Threshold Pulse Width: 0.5 ms
Lead Channel Sensing Intrinsic Amplitude: 12 mV
Lead Channel Setting Pacing Amplitude: 2 V
Lead Channel Setting Pacing Pulse Width: 0.5 ms
Lead Channel Setting Pacing Pulse Width: 0.8 ms
MDC IDC LEAD IMPLANT DT: 20160923
MDC IDC MSMT BATTERY REMAINING LONGEVITY: 55 mo
MDC IDC MSMT BATTERY REMAINING PERCENTAGE: 68 %
MDC IDC MSMT LEADCHNL LV PACING THRESHOLD PULSEWIDTH: 0.8 ms
MDC IDC MSMT LEADCHNL RA PACING THRESHOLD PULSEWIDTH: 0.5 ms
MDC IDC MSMT LEADCHNL RA SENSING INTR AMPL: 3.2 mV
MDC IDC MSMT LEADCHNL RV IMPEDANCE VALUE: 750 Ohm
MDC IDC PG IMPLANT DT: 20160923
MDC IDC PG SERIAL: 7294918
MDC IDC SET LEADCHNL LV PACING AMPLITUDE: 2.25 V
MDC IDC SET LEADCHNL RV PACING AMPLITUDE: 2 V
MDC IDC SET LEADCHNL RV SENSING SENSITIVITY: 0.5 mV
MDC IDC STAT BRADY AS VS PERCENT: 1 %

## 2018-01-23 ENCOUNTER — Other Ambulatory Visit (HOSPITAL_COMMUNITY): Payer: Self-pay | Admitting: Interventional Radiology

## 2018-01-23 DIAGNOSIS — I771 Stricture of artery: Secondary | ICD-10-CM

## 2018-02-03 ENCOUNTER — Ambulatory Visit (HOSPITAL_COMMUNITY): Payer: Medicare Other

## 2018-02-03 ENCOUNTER — Ambulatory Visit (HOSPITAL_COMMUNITY)
Admission: RE | Admit: 2018-02-03 | Discharge: 2018-02-03 | Disposition: A | Discharge status: Home / Self Care | Payer: Medicare Other | Source: Ambulatory Visit | Attending: Interventional Radiology | Admitting: Interventional Radiology

## 2018-02-03 DIAGNOSIS — I771 Stricture of artery: Secondary | ICD-10-CM | POA: Insufficient documentation

## 2018-02-03 DIAGNOSIS — I700 Atherosclerosis of aorta: Secondary | ICD-10-CM | POA: Diagnosis not present

## 2018-02-03 DIAGNOSIS — I6523 Occlusion and stenosis of bilateral carotid arteries: Secondary | ICD-10-CM | POA: Diagnosis not present

## 2018-02-03 LAB — POCT I-STAT CREATININE: CREATININE: 0.7 mg/dL (ref 0.61–1.24)

## 2018-02-03 MED ORDER — IOPAMIDOL (ISOVUE-370) INJECTION 76%
INTRAVENOUS | Status: AC
  Administered 2018-02-03: 50 mL via INTRAVENOUS
  Filled 2018-02-03: qty 50

## 2018-02-13 ENCOUNTER — Telehealth (HOSPITAL_COMMUNITY): Payer: Self-pay

## 2018-02-13 NOTE — Telephone Encounter (Signed)
Pt agreed to f/u in 6 months with cta head and neck. AW

## 2018-02-17 ENCOUNTER — Ambulatory Visit (INDEPENDENT_AMBULATORY_CARE_PROVIDER_SITE_OTHER): Payer: Medicare Other | Admitting: Nurse Practitioner

## 2018-02-17 ENCOUNTER — Encounter: Payer: Self-pay | Admitting: Nurse Practitioner

## 2018-02-17 DIAGNOSIS — I255 Ischemic cardiomyopathy: Secondary | ICD-10-CM | POA: Diagnosis not present

## 2018-02-17 DIAGNOSIS — F1011 Alcohol abuse, in remission: Secondary | ICD-10-CM

## 2018-02-17 DIAGNOSIS — Z87898 Personal history of other specified conditions: Secondary | ICD-10-CM | POA: Diagnosis not present

## 2018-02-17 DIAGNOSIS — K7030 Alcoholic cirrhosis of liver without ascites: Secondary | ICD-10-CM | POA: Diagnosis not present

## 2018-02-17 NOTE — Progress Notes (Addendum)
Referring Provider: Celene Squibb, MD Primary Care Physician:  Celene Squibb, MD Primary GI:  Dr. Oneida Alar  Chief Complaint  Patient presents with  . Cirrhosis    f/u, doing ok    HPI:   Roy [name] is a 72 y.o. male who presents for follow-up on alcoholic cirrhosis.  The patient was last seen in our office 08/19/2017.  Historically well compensated disease.  No longer using alcohol.  EGD up-to-date 2015 with no varices or portal gastropathy noted.  At his last visit he was doing well, recently had femoropopliteal bypass.  No GI or hepatic symptoms noted.  Commended updated labs and imaging, alcohol avoidance, follow-up in 6 months.  His labs were completed 08/19/2017 and were essentially normal.  Meld score of 7, child Pugh class A.  Upper quadrant ultrasound found no focal liver lesion.  Today he states he's doing ok. Legs still with swelling but improved blood flow. Denies abdominal pain, N/V, hematochezia, melena, fever, chills, unintentional weight loss. Denies yellowing of skin/eyes, darkened urine, acute episodic confusion, tremors. Denies chest pain, dyspnea, dizziness, lightheadedness, syncope, near syncope. Denies any other upper or lower GI symptoms.  Past Medical History:  Diagnosis Date  . AICD (automatic cardioverter/defibrillator) present 08/19/2015   St Jude BiV ICD for primary prevention by Dr. Lovena Le  . Alcohol abuse    6 beers per day; hospital admission in 2009 for withdrawal symptoms  . Alcoholic cirrhosis ([place])   . Anxiety and depression   . Cerebrovascular disease 2009   TIA; 2009- right ICA stent; re-intervention for restenosis complicated by [facility] w/o sx  . CHF (congestive heart failure) ([place]) 06-02-14  . Chronic obstructive pulmonary disease ([place])   . Degenerative joint disease    Total shoulder arthroplasty-right  . Hyperlipidemia   . Hypertension   . LBBB (left bundle branch block)    Normal echo-2011; stress nuclear in 09/2010--septal hypoperfusion  representing nontransmural infarction or the effect of left bundle branch block, no ischemia  . Myocardial infarction [facility]) June 02, 2014   Massive Heart Attack  . Peripheral vascular disease ([place])   . 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  . Thrombocytopenia ([place])   . Tobacco abuse    -100 pack years; 1.5 packs per day  . Traumatic seroma of thigh    left  . Tubular adenoma of colon     Past Surgical History:  Procedure Laterality Date  . ABDOMINAL AORTAGRAM [name] 05/04/2014   Procedure: ABDOMINAL Maxcine Ham;  Surgeon: [name], MD;  Location: [facility] CATH LAB;  Service: Cardiovascular;  Laterality: [name];  . ABDOMINAL AORTOGRAM [name] 06/25/2017   Procedure: Abdominal Aortogram;  Surgeon: [name], MD;  Location: [place] CV LAB;  Service: Cardiovascular;  Laterality: [name];  . AGILE CAPSULE [name] 03/18/2014   Procedure: AGILE CAPSULE;  Surgeon: Danie Binder, MD;  [facility] ENDO SU[facility];  Service: Endoscopy;  Laterality: [name];  7:30  . APPLICATION OF WOUND VAC Left 09/03/2017   Procedure: APPLICATION OF WOUND VAC;  Surgeon: Conrad [place], MD;  Location: [place];  Service: Vascular;  Laterality: Left;  . BACK SURGERY    . BACTERIAL OVERGROWTH TEST [name] 05/24/2015   Procedure: BACTERIAL OVERGROWTH TEST;  Surgeon: Danie Binder, MD;  [facility];  Service: Endoscopy;  Laterality: [name];  0700  . BI-VENTRICULAR IMPLANTABLE CARDIOVERTER DEFIBRILLATOR  (CRT-D)  08/19/2015  . CARPAL TUNNEL RELEASE Left 02/02/2016   Procedure: LEFT CARPAL TUNNEL RELEASE;  Surgeon:  Daryll Brod, MD;  Location: [place];  Service: Orthopedics;  Laterality: Left;  ANESTHESIA: IV REGIONAL UPPER ARM  . CATARACT EXTRACTION W/PHACO Right 03/08/2015   Procedure: CATARACT EXTRACTION PHACO AND INTRAOCULAR LENS PLACEMENT (IOC);  Surgeon: Rutherford Guys, MD;  Location: [facility] ORS;  Service: Ophthalmology;  Laterality: Right;  CDE:9.46  . CATARACT EXTRACTION W/PHACO Left 03/22/2015   Procedure:  CATARACT EXTRACTION PHACO AND INTRAOCULAR LENS PLACEMENT (IOC);  Surgeon: Rutherford Guys, MD;  Location: [facility] ORS;  Service: Ophthalmology;  Laterality: Left;  CDE:5.80  . COLONOSCOPY  08/22/09   Fields-(Tubular Adenoma)3-mm transverse polyp/4-mm polyp otherwise noraml/small internal hemorrhoids  . COLONOSCOPY [name] 04/14/2014   LOV:FIEPP internal hemorrhids/normal mocsa in the terminal iluem/left colonis redundant  . COLONOSCOPY W/ POLYPECTOMY  2011  . ENDARTERECTOMY FEMORAL Left 08/16/2017   Procedure: ENDARTERECTOMY LEFT PROFUNDA FEMORAL;  Surgeon: [name], MD;  Location: [place];  Service: Vascular;  Laterality: Left;  . EP IMPLANTABLE DEVICE [name] 08/19/2015   Procedure: BiV ICD Insertion CRT-D;  Surgeon: Evans Lance, MD;  Location: [place] CV LAB;  Service: Cardiovascular;  Laterality: [name];  . ESOPHAGOGASTRODUODENOSCOPY [name] 03/05/2014   SLF: 1. Stricture at the gastroesophagael junction 2. Small hiatal hernia 3. Moderate non-erosive gastritis and duodentitis. 4. No surce for Melena identified.   . FEMORAL-POPLITEAL BYPASS GRAFT Left 08/16/2017   Procedure: LEFT  FEMORAL-POPLITEAL ARTERY  BYPASS GRAFT;  Surgeon: [name], MD;  Location: [place];  Service: Vascular;  Laterality: Left;  . GIVENS CAPSULE STUDY [name] 03/30/2014   Procedure: GIVENS CAPSULE STUDY;  Surgeon: Danie Binder, MD;  [facility];  Service: Endoscopy;  Laterality: [name];  7:30  . I&D EXTREMITY Left 09/03/2017   Procedure: IRRIGATION AND DEBRIDEMENT EXTREMITY LEFT THIGH SEROMA;  Surgeon: Conrad , MD;  Location[facility] OR;  Service: Vascular;  Laterality: Left;  . IR ANGIO INTRA EXTRACRAN SEL COM CAROTID INNOMINATE BILAT MOD SED  07/16/2017  . IR ANGIO VERTEBRAL SEL SUBCLAVIAN INNOMINATE BILAT MOD SED  07/16/2017  . IR RADIOLOGIST EVAL & MGMT  04/01/2017  . IR RADIOLOGIST EVAL & MGMT  08/02/2017  . JOINT REPLACEMENT Right   . LOWER EXTREMITY ANGIOGRAPHY Bilateral 06/25/2017   Procedure: Lower Extremity  Angiography;  Surgeon: [name], MD;  Location: [place] CV LAB;  Service: Cardiovascular;  Laterality: Bilateral;  . LUMBAR FUSION  2010  . TOTAL SHOULDER ARTHROPLASTY Right 2011   Dr. Tamera Punt  . ULNAR NERVE TRANSPOSITION Left 02/02/2016   Procedure: LEFT IN-SITU DECOMPRESSION ULNAR NERVE ;  Surgeon: Daryll Brod, MD;  Location: [place];  Service: Orthopedics;  Laterality: Left;  . ULNAR TUNNEL RELEASE Left 02/02/2016   Procedure: LEFT CUBITAL TUNNEL RELEASE;  Surgeon: Daryll Brod, MD;  Location: [place];  Service: Orthopedics;  Laterality: Left;  [name] VASECTOMY  1971  . VEIN HARVEST Left 08/16/2017   Procedure: USING NON REVERSE LEFT GREATER SAPHENOUS VEIN HARVEST;  Surgeon: [name], MD;  Location: [facility] OR;  Service: Vascular;  Laterality: Left;    Current Outpatient Medications  Medication Sig Dispense Refill  . acetaminophen (TYLENOL) 500 MG tablet Take 500 mg by mouth daily as needed for moderate pain.    [name] aspirin EC 81 MG tablet Take 81 mg by mouth every evening.     [name] atorvastatin (LIPITOR) 20 MG tablet Take 20 mg by mouth daily.     . carvedilol (COREG) 3.125 MG tablet TAKE 1 TABLET TWICE A  DAY (NEED TO CALL AND MAKE AN APPOINTMENT FOR FUTURE REFILLS) 180 tablet 0  . clopidogrel (PLAVIX) 75 MG tablet Take 75 mg by mouth every evening.     [name] dextromethorphan-guaiFENesin (MUCINEX DM) 30-600 MG 12hr tablet Take 1 tablet by mouth daily.    [name] ENTRESTO 49-51 MG TAKE 1 TABLET TWICE A DAY 893 tablet 3  . folic acid (FOLVITE) 810 MCG tablet Take 800 mcg by mouth daily.    [name] gabapentin (NEURONTIN) 300 MG capsule Take 300 mg by mouth 2 (two) times daily.     . Multiple Vitamins-Minerals (CENTRUM SILVER PO) Take 1 tablet by mouth daily.     [name] OVER THE COUNTER MEDICATION OTC Potassium daily (unsure of dose)    . pantoprazole (PROTONIX) 40 MG tablet TAKE 1 TABLET DAILY (Patient taking differently: TAKE 40 mg by mouth DAILY) 90 tablet 3  . PARoxetine  (PAXIL) 20 MG tablet Take 20 mg by mouth daily.    . SUPER B COMPLEX/C PO Take 1 tablet by mouth daily.    . tamsulosin (FLOMAX) 0.4 MG CAPS capsule Take 0.4 mg by mouth daily.     No current facility-administered medications for this visit.     Allergies as of 02/17/2018  . (No Known Allergies)    Family History  Problem Relation Age of Onset  . Coronary artery disease Mother   . Diabetes Mother   . Heart disease Mother        Before age 74 - 31 Bypasses  . Hypertension Mother   . Heart attack Mother        3-4 Heart attacks  . Alzheimer's disease Father   . Diabetes Father   . Heart attack Sister   . Cancer Brother        Prostate  . Hyperlipidemia Son   . Prostate cancer Brother   . Alzheimer's disease Sister   . Colon cancer Neg Hx     Social History   Socioeconomic History  . Marital status: Married    Spouse name: Not on file  . Number of children: 2  . Years of education: Not on file  . Highest education level: Not on file  Occupational History  . Occupation: retired, Clinical biochemist    Comment: Programmer, systems: RETIRED  Social Needs  . Financial resource strain: Not on file  . Food insecurity:    Worry: Not on file    Inability: Not on file  . Transportation needs:    Medical: Not on file    Non-medical: Not on file  Tobacco Use  . Smoking status: Current Every Day Smoker    Packs/day: 1.00    Years: 60.00    Pack years: 60.00    Types: Cigarettes    Start date: 08/20/1957  . Smokeless tobacco: Never Used  Substance and Sexual Activity  . Alcohol use: No    Alcohol/week: 0.0 oz    Comment: quit in July 2015  . Drug use: No  . Sexual activity: Never  Lifestyle  . Physical activity:    Days per week: Not on file    Minutes per session: Not on file  . Stress: Not on file  Relationships  . Social connections:    Talks on phone: Not on file    Gets together: Not on file    Attends religious service: Not on file    Active member of club  or organization: Not on file    Attends meetings of  clubs or organizations: Not on file    Relationship status: Not on file  Other Topics Concern  . Not on file  Social History Narrative   Accompanied by daughter [name]   Lives w/ wife, daughter    Review of Systems: Complete ROS negative except as per HPI.   Physical Exam: BP 140/74   Pulse 71   Temp (!) 97 F (36.1 C) (Oral)   Ht 5\' 3"  (1.6 m)   Wt 163 lb (73.9 kg)   BMI 28.87 kg/m  General:   Alert and oriented. Pleasant and cooperative. Well-nourished and well-developed.  Eyes:  Without icterus, sclera clear and conjunctiva pink.  Ears:  Normal auditory acuity. Cardiovascular:  S1, S2 present without murmurs appreciated.Extremities without clubbing or edema. Respiratory:  Clear to auscultation bilaterally. No wheezes, rales, or rhonchi. No distress.  Gastrointestinal:  +BS, soft, non-tender and non-distended. No HSM noted. No guarding or rebound. No masses appreciated.  Rectal:  Deferred  Musculoskalatal:  Symmetrical without gross deformities. Neurologic:  Alert and oriented x4;  grossly normal neurologically. No tremors. Psych:  Alert and cooperative. Normal mood and affect. Heme/Lymph/Immune: No excessive bruising noted.    02/17/2018 8:32 AM   Disclaimer: This note was dictated with voice recognition software. Similar sounding words can inadvertently be transcribed and may not be corrected upon review.

## 2018-02-17 NOTE — Progress Notes (Signed)
cc'ed to pcp °

## 2018-02-17 NOTE — Patient Instructions (Signed)
1. Need to avoid alcohol as you have been for several years now. 2. Have your labs checked when you are able to. 3. We will check your ultrasound for you. 4. Follow-up in 6 months. 5. Call if you have any questions or concerns.   Have a Great Summer!    At [facility] we value your feedback. You may receive a survey about your visit today. Please share your experience as we strive to create trusing relationships with our patients to provide genuine, compassionate, quality care.

## 2018-02-17 NOTE — Addendum Note (Signed)
Addended by: Gordy Levan, ERIC A on: 02/17/2018 08:33 AM   Modules accepted: Orders

## 2018-02-17 NOTE — Assessment & Plan Note (Addendum)
Currently appears well compensated as he has been historically.  We will update labs and imaging.  No objective signs of portal hypertension.  Follow-up in 6 months.  If he continues to be well compensated, based on elastography score of F2, we can likely follow him yearly.

## 2018-02-17 NOTE — Assessment & Plan Note (Signed)
Call since 2015.  This is likely why his disease does not appear to have progressed.  He remains well compensated as per above.  Follow-up in 6 months.  Again advised abstinence from alcohol.

## 2018-02-18 LAB — COMPREHENSIVE METABOLIC PANEL
AG RATIO: 2 (calc) (ref 1.0–2.5)
ALBUMIN MSPROF: 4.5 g/dL (ref 3.6–5.1)
ALT: 9 U/L (ref 9–46)
AST: 16 U/L (ref 10–35)
Alkaline phosphatase (APISO): 73 U/L (ref 40–115)
BILIRUBIN TOTAL: 0.6 mg/dL (ref 0.2–1.2)
BUN: 10 mg/dL (ref 7–25)
CALCIUM: 9.2 mg/dL (ref 8.6–10.3)
CHLORIDE: 98 mmol/L (ref 98–110)
CO2: 29 mmol/L (ref 20–32)
Creat: 0.71 mg/dL (ref 0.70–1.18)
GLOBULIN: 2.3 g/dL (ref 1.9–3.7)
GLUCOSE: 88 mg/dL (ref 65–139)
POTASSIUM: 4.4 mmol/L (ref 3.5–5.3)
SODIUM: 134 mmol/L — AB (ref 135–146)
TOTAL PROTEIN: 6.8 g/dL (ref 6.1–8.1)

## 2018-02-18 LAB — CBC WITH DIFFERENTIAL/PLATELET
BASOS PCT: 0.5 %
Basophils Absolute: 31 cells/uL (ref 0–200)
EOS ABS: 238 {cells}/uL (ref 15–500)
EOS PCT: 3.9 %
HEMATOCRIT: 41.5 % (ref 38.5–50.0)
Hemoglobin: 14.1 g/dL (ref 13.2–17.1)
Lymphs Abs: 1318 cells/uL (ref 850–3900)
MCH: 30.5 pg (ref 27.0–33.0)
MCHC: 34 g/dL (ref 32.0–36.0)
MCV: 89.8 fL (ref 80.0–100.0)
MONOS PCT: 9.3 %
MPV: 11.9 fL (ref 7.5–12.5)
Neutro Abs: 3947 cells/uL (ref 1500–7800)
Neutrophils Relative %: 64.7 %
Platelets: 241 10*3/uL (ref 140–400)
RBC: 4.62 10*6/uL (ref 4.20–5.80)
RDW: 12.4 % (ref 11.0–15.0)
Total Lymphocyte: 21.6 %
WBC: 6.1 10*3/uL (ref 3.8–10.8)
WBCMIX: 567 {cells}/uL (ref 200–950)

## 2018-02-18 LAB — PROTIME-INR
INR: 1.1
Prothrombin Time: 11.2 s (ref 9.0–11.5)

## 2018-02-18 LAB — AFP TUMOR MARKER: AFP-Tumor Marker: 1.4 ng/mL (ref <6.1)

## 2018-02-19 ENCOUNTER — Ambulatory Visit (HOSPITAL_COMMUNITY)
Admission: RE | Admit: 2018-02-19 | Discharge: 2018-02-19 | Disposition: A | Discharge status: Home / Self Care | Payer: Medicare Other | Source: Ambulatory Visit | Attending: Nurse Practitioner | Admitting: Nurse Practitioner

## 2018-02-19 DIAGNOSIS — F1011 Alcohol abuse, in remission: Secondary | ICD-10-CM

## 2018-02-19 DIAGNOSIS — K7460 Unspecified cirrhosis of liver: Secondary | ICD-10-CM | POA: Diagnosis not present

## 2018-02-19 DIAGNOSIS — K7030 Alcoholic cirrhosis of liver without ascites: Secondary | ICD-10-CM | POA: Insufficient documentation

## 2018-02-24 NOTE — Progress Notes (Signed)
Letter mailed with results also.

## 2018-03-18 DIAGNOSIS — Z6829 Body mass index (BMI) 29.0-29.9, adult: Secondary | ICD-10-CM | POA: Diagnosis not present

## 2018-03-18 DIAGNOSIS — M542 Cervicalgia: Secondary | ICD-10-CM | POA: Diagnosis not present

## 2018-03-26 ENCOUNTER — Other Ambulatory Visit: Payer: Self-pay | Admitting: Internal Medicine

## 2018-03-31 ENCOUNTER — Ambulatory Visit (INDEPENDENT_AMBULATORY_CARE_PROVIDER_SITE_OTHER): Payer: Medicare Other | Admitting: *Deleted

## 2018-03-31 DIAGNOSIS — I5022 Chronic systolic (congestive) heart failure: Secondary | ICD-10-CM

## 2018-03-31 DIAGNOSIS — I255 Ischemic cardiomyopathy: Secondary | ICD-10-CM | POA: Diagnosis not present

## 2018-03-31 NOTE — Progress Notes (Signed)
Remote ICD transmission.   

## 2018-04-02 ENCOUNTER — Encounter: Payer: Self-pay | Admitting: Cardiology

## 2018-04-07 ENCOUNTER — Ambulatory Visit (INDEPENDENT_AMBULATORY_CARE_PROVIDER_SITE_OTHER)
Admission: RE | Admit: 2018-04-07 | Discharge: 2018-04-07 | Disposition: A | Discharge status: Home / Self Care | Payer: Medicare Other | Source: Ambulatory Visit | Attending: Surgery | Admitting: Surgery

## 2018-04-07 ENCOUNTER — Encounter: Payer: Self-pay | Admitting: Surgery

## 2018-04-07 ENCOUNTER — Other Ambulatory Visit: Payer: Self-pay

## 2018-04-07 ENCOUNTER — Encounter: Payer: Self-pay | Admitting: *Deleted

## 2018-04-07 ENCOUNTER — Other Ambulatory Visit: Payer: Self-pay | Admitting: *Deleted

## 2018-04-07 ENCOUNTER — Ambulatory Visit (HOSPITAL_COMMUNITY)
Admission: RE | Admit: 2018-04-07 | Discharge: 2018-04-07 | Disposition: A | Discharge status: Home / Self Care | Payer: Medicare Other | Source: Ambulatory Visit | Attending: Surgery | Admitting: Surgery

## 2018-04-07 ENCOUNTER — Ambulatory Visit (INDEPENDENT_AMBULATORY_CARE_PROVIDER_SITE_OTHER): Payer: Medicare Other | Admitting: Surgery

## 2018-04-07 VITALS — BP 145/74 | HR 60 | Temp 97.4°F | Resp 20 | Ht 63.0 in | Wt 161.0 lb

## 2018-04-07 DIAGNOSIS — F17200 Nicotine dependence, unspecified, uncomplicated: Secondary | ICD-10-CM | POA: Insufficient documentation

## 2018-04-07 DIAGNOSIS — I70213 Atherosclerosis of native arteries of extremities with intermittent claudication, bilateral legs: Secondary | ICD-10-CM

## 2018-04-07 DIAGNOSIS — I255 Ischemic cardiomyopathy: Secondary | ICD-10-CM | POA: Diagnosis not present

## 2018-04-07 DIAGNOSIS — I10 Essential (primary) hypertension: Secondary | ICD-10-CM | POA: Diagnosis not present

## 2018-04-07 DIAGNOSIS — E785 Hyperlipidemia, unspecified: Secondary | ICD-10-CM | POA: Insufficient documentation

## 2018-04-07 NOTE — Progress Notes (Signed)
Vascular and Vein Specialist of [place]  Patient name: Roy [name] MRN: [id number] DOB: 03-23-1946 Sex: male   REASON FOR VISIT:    Follow up  HISOTRY OF PRESENT ILLNESS:   This is a 72 year old gentleman who returns today for follow-up of his claudication symptoms. He is status post bilateral iliac stenting in 2015. This was done for lifestyle limiting claudication. An 8 x 40 self extending stent was placed on the left and 8 x 80 on the right.  He developed progressive symptoms and on 08/16/2017 he underwent a left femoral to above-knee popliteal artery bypass graft with ipsilateral non-reversed greater saphenous vein as well as the extensive profunda femoral endarterectomy. He did well initially however developed a fluid collection around the distal incision, and went back for I and D on 09/03/2017 with wound VAC placement  He states that his left leg is doing great however he is having significant difficulty with his right leg and cramping with minimal activity.  He does not have any open wounds.  The patient has a significant smoking history with no interest of stopping.  He has a history of stroke as well as a intracranial carotid stent.  He has a history of significant alcohol consumption with hospitalization for withdrawal.  He is medically managed for hypertension.  He is on a statin for hypercholesterolemia.  He is on dual agent antiplatelet therapy with aspirin and Plavix.   PAST MEDICAL HISTORY:   Past Medical History:  Diagnosis Date  . AICD (automatic cardioverter/defibrillator) present 08/19/2015   St Jude BiV ICD for primary prevention by Dr. Lovena Le  . Alcohol abuse    6 beers per day; hospital admission in 2009 for withdrawal symptoms  . Alcoholic cirrhosis ([place])   . Anxiety and depression   . Cerebrovascular disease 2009   TIA; 2009- right ICA stent; re-intervention for restenosis complicated by [facility] w/o sx  . CHF  (congestive heart failure) ([place]) 06-02-14  . Chronic obstructive pulmonary disease ([place])   . Degenerative joint disease    Total shoulder arthroplasty-right  . Hyperlipidemia   . Hypertension   . LBBB (left bundle branch block)    Normal echo-2011; stress nuclear in 09/2010--septal hypoperfusion representing nontransmural infarction or the effect of left bundle branch block, no ischemia  . Myocardial infarction [facility]) June 02, 2014   Massive Heart Attack  . Peripheral vascular disease ([place])   . 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  . Thrombocytopenia ([place])   . Tobacco abuse    -100 pack years; 1.5 packs per day  . Traumatic seroma of thigh    left  . Tubular adenoma of colon      FAMILY HISTORY:   Family History  Problem Relation Age of Onset  . Coronary artery disease Mother   . Diabetes Mother   . Heart disease Mother        Before age 37 - 89 Bypasses  . Hypertension Mother   . Heart attack Mother        3-4 Heart attacks  . Alzheimer's disease Father   . Diabetes Father   . Heart attack Sister   . Cancer Brother        Prostate  . Hyperlipidemia Son   . Prostate cancer Brother   . Alzheimer's disease Sister   . Colon cancer Neg Hx     SOCIAL HISTORY:   Social History   Tobacco Use  . Smoking status: Current Every Day Smoker    Packs/day:  1.00    Years: 60.00    Pack years: 60.00    Types: Cigarettes    Start date: 08/20/1957  . Smokeless tobacco: Never Used  Substance Use Topics  . Alcohol use: No    Alcohol/week: 0.0 oz    Comment: quit in July 2015     ALLERGIES:   No Known Allergies   CURRENT MEDICATIONS:   Current Outpatient Medications  Medication Sig Dispense Refill  . acetaminophen (TYLENOL) 500 MG tablet Take 500 mg by mouth daily as needed for moderate pain.    [name] aspirin EC 81 MG tablet Take 81 mg by mouth every evening.     [name] atorvastatin (LIPITOR) 20 MG tablet Take 20 mg by mouth daily.     . carvedilol (COREG) 3.125 MG  tablet TAKE 1 TABLET TWICE A DAY (NEED TO CALL AND MAKE AN APPOINTMENT FOR FUTURE REFILLS) 180 tablet 0  . clopidogrel (PLAVIX) 75 MG tablet Take 75 mg by mouth every evening.     [name] dextromethorphan-guaiFENesin (MUCINEX DM) 30-600 MG 12hr tablet Take 1 tablet by mouth daily.    [name] ENTRESTO 49-51 MG TAKE 1 TABLET TWICE A DAY 010 tablet 3  . folic acid (FOLVITE) 272 MCG tablet Take 800 mcg by mouth daily.    [name] gabapentin (NEURONTIN) 300 MG capsule Take 300 mg by mouth 2 (two) times daily.     . Multiple Vitamins-Minerals (CENTRUM SILVER PO) Take 1 tablet by mouth daily.     [name] OVER THE COUNTER MEDICATION OTC Potassium daily (unsure of dose)    . pantoprazole (PROTONIX) 40 MG tablet TAKE 1 TABLET DAILY (Patient taking differently: TAKE 40 mg by mouth DAILY) 90 tablet 3  . PARoxetine (PAXIL) 20 MG tablet Take 20 mg by mouth daily.    . SUPER B COMPLEX/C PO Take 1 tablet by mouth daily.    . tamsulosin (FLOMAX) 0.4 MG CAPS capsule Take 0.4 mg by mouth daily.     No current facility-administered medications for this visit.     REVIEW OF SYSTEMS:   [X]  denotes positive finding, [ ]  denotes negative finding Cardiac  Comments:  Chest pain or chest pressure:    Shortness of breath upon exertion:    Short of breath when lying flat:    Irregular heart rhythm:        Vascular    Pain in calf, thigh, or hip brought on by ambulation: x   Pain in feet at night that wakes you up from your sleep:     Blood clot in your veins:    Leg swelling:         Pulmonary    Oxygen at home:    Productive cough:     Wheezing:         Neurologic    Sudden weakness in arms or legs:     Sudden numbness in arms or legs:     Sudden onset of difficulty speaking or slurred speech:    Temporary loss of vision in one eye:     Problems with dizziness:         Gastrointestinal    Blood in stool:     Vomited blood:         Genitourinary    Burning when urinating:     Blood in urine:        Psychiatric      Major depression:         Hematologic    Bleeding problems:  Problems with blood clotting too easily:        Skin    Rashes or ulcers:        Constitutional    Fever or chills:      PHYSICAL EXAM:   There were no vitals filed for this visit.  GENERAL: The patient is a well-nourished male, in no acute distress. The vital signs are documented above. CARDIAC: There is a regular rate and rhythm.  VASCULAR: non palpable pedal pulse on right PULMONARY: Non-labored respirations MUSCULOSKELETAL: There are no major deformities or cyanosis. NEUROLOGIC: No focal weakness or paresthesias are detected. SKIN: There are no ulcers or rashes noted. PSYCHIATRIC: The patient has a normal affect.  STUDIES:   I have ordered and reviewed his vascular lab studies with the following findings: Right ABI = 0.7 Left ABI = 0.92  Left leg bypass is widely patent 50-9 9% stenosis within the distal stent of the right iliac artery  MEDICAL ISSUES:   Left lower extremity bypass: Remains widely patent.  He will return in 6 months for surveillance imaging Right leg claudication: I have reviewed the angiogram with the patient.  His symptoms are significant and to the point where intervention is warranted.  I will plan on angiogram with left leg access.  I will evaluate the iliac system and address it as indicated.  I will also plan on intervening in the right superficial femoral artery.  This is been scheduled for Tuesday, May 28.  The risks and benefits of the procedure were discussed with the patient.  All of his questions were answered.    Annamarie Major, MD Vascular and Vein Specialists of [facility] [phone] Pager [phone]

## 2018-04-07 NOTE — H&P (View-Only) (Signed)
Vascular and Vein Specialist of [place]  Patient name: Roy [name] MRN: [id number] DOB: Apr 27, 1946 Sex: male   REASON FOR VISIT:    Follow up  HISOTRY OF PRESENT ILLNESS:   This is a 72 year old gentleman who returns today for follow-up of his claudication symptoms. He is status post bilateral iliac stenting in 2015. This was done for lifestyle limiting claudication. An 8 x 40 self extending stent was placed on the left and 8 x 80 on the right.  He developed progressive symptoms and on 08/16/2017 he underwent a left femoral to above-knee popliteal artery bypass graft with ipsilateral non-reversed greater saphenous vein as well as the extensive profunda femoral endarterectomy. He did well initially however developed a fluid collection around the distal incision, and went back for I and D on 09/03/2017 with wound VAC placement  He states that his left leg is doing great however he is having significant difficulty with his right leg and cramping with minimal activity.  He does not have any open wounds.  The patient has a significant smoking history with no interest of stopping.  He has a history of stroke as well as a intracranial carotid stent.  He has a history of significant alcohol consumption with hospitalization for withdrawal.  He is medically managed for hypertension.  He is on a statin for hypercholesterolemia.  He is on dual agent antiplatelet therapy with aspirin and Plavix.   PAST MEDICAL HISTORY:   Past Medical History:  Diagnosis Date  . AICD (automatic cardioverter/defibrillator) present 08/19/2015   St Jude BiV ICD for primary prevention by Dr. Lovena Le  . Alcohol abuse    6 beers per day; hospital admission in 2009 for withdrawal symptoms  . Alcoholic cirrhosis ([place])   . Anxiety and depression   . Cerebrovascular disease 2009   TIA; 2009- right ICA stent; re-intervention for restenosis complicated by [facility] w/o sx  . CHF  (congestive heart failure) ([place]) 06-02-14  . Chronic obstructive pulmonary disease ([place])   . Degenerative joint disease    Total shoulder arthroplasty-right  . Hyperlipidemia   . Hypertension   . LBBB (left bundle branch block)    Normal echo-2011; stress nuclear in 09/2010--septal hypoperfusion representing nontransmural infarction or the effect of left bundle branch block, no ischemia  . Myocardial infarction [facility]) June 02, 2014   Massive Heart Attack  . Peripheral vascular disease ([facility])   . 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  . Thrombocytopenia ([place])   . Tobacco abuse    -100 pack years; 1.5 packs per day  . Traumatic seroma of thigh    left  . Tubular adenoma of colon      FAMILY HISTORY:   Family History  Problem Relation Age of Onset  . Coronary artery disease Mother   . Diabetes Mother   . Heart disease Mother        Before age 4 - 18 Bypasses  . Hypertension Mother   . Heart attack Mother        3-4 Heart attacks  . Alzheimer's disease Father   . Diabetes Father   . Heart attack Sister   . Cancer Brother        Prostate  . Hyperlipidemia Son   . Prostate cancer Brother   . Alzheimer's disease Sister   . Colon cancer Neg Hx     SOCIAL HISTORY:   Social History   Tobacco Use  . Smoking status: Current Every Day Smoker    Packs/day:  1.00    Years: 60.00    Pack years: 60.00    Types: Cigarettes    Start date: 08/20/1957  . Smokeless tobacco: Never Used  Substance Use Topics  . Alcohol use: No    Alcohol/week: 0.0 oz    Comment: quit in July 2015     ALLERGIES:   No Known Allergies   CURRENT MEDICATIONS:   Current Outpatient Medications  Medication Sig Dispense Refill  . acetaminophen (TYLENOL) 500 MG tablet Take 500 mg by mouth daily as needed for moderate pain.    [name] aspirin EC 81 MG tablet Take 81 mg by mouth every evening.     [name] atorvastatin (LIPITOR) 20 MG tablet Take 20 mg by mouth daily.     . carvedilol (COREG) 3.125 MG  tablet TAKE 1 TABLET TWICE A DAY (NEED TO CALL AND MAKE AN APPOINTMENT FOR FUTURE REFILLS) 180 tablet 0  . clopidogrel (PLAVIX) 75 MG tablet Take 75 mg by mouth every evening.     [name] dextromethorphan-guaiFENesin (MUCINEX DM) 30-600 MG 12hr tablet Take 1 tablet by mouth daily.    [name] ENTRESTO 49-51 MG TAKE 1 TABLET TWICE A DAY 834 tablet 3  . folic acid (FOLVITE) 196 MCG tablet Take 800 mcg by mouth daily.    [name] gabapentin (NEURONTIN) 300 MG capsule Take 300 mg by mouth 2 (two) times daily.     . Multiple Vitamins-Minerals (CENTRUM SILVER PO) Take 1 tablet by mouth daily.     [name] OVER THE COUNTER MEDICATION OTC Potassium daily (unsure of dose)    . pantoprazole (PROTONIX) 40 MG tablet TAKE 1 TABLET DAILY (Patient taking differently: TAKE 40 mg by mouth DAILY) 90 tablet 3  . PARoxetine (PAXIL) 20 MG tablet Take 20 mg by mouth daily.    . SUPER B COMPLEX/C PO Take 1 tablet by mouth daily.    . tamsulosin (FLOMAX) 0.4 MG CAPS capsule Take 0.4 mg by mouth daily.     No current facility-administered medications for this visit.     REVIEW OF SYSTEMS:   [X]  denotes positive finding, [ ]  denotes negative finding Cardiac  Comments:  Chest pain or chest pressure:    Shortness of breath upon exertion:    Short of breath when lying flat:    Irregular heart rhythm:        Vascular    Pain in calf, thigh, or hip brought on by ambulation: x   Pain in feet at night that wakes you up from your sleep:     Blood clot in your veins:    Leg swelling:         Pulmonary    Oxygen at home:    Productive cough:     Wheezing:         Neurologic    Sudden weakness in arms or legs:     Sudden numbness in arms or legs:     Sudden onset of difficulty speaking or slurred speech:    Temporary loss of vision in one eye:     Problems with dizziness:         Gastrointestinal    Blood in stool:     Vomited blood:         Genitourinary    Burning when urinating:     Blood in urine:        Psychiatric      Major depression:         Hematologic    Bleeding problems:  Problems with blood clotting too easily:        Skin    Rashes or ulcers:        Constitutional    Fever or chills:      PHYSICAL EXAM:   There were no vitals filed for this visit.  GENERAL: The patient is a well-nourished male, in no acute distress. The vital signs are documented above. CARDIAC: There is a regular rate and rhythm.  VASCULAR: non palpable pedal pulse on right PULMONARY: Non-labored respirations MUSCULOSKELETAL: There are no major deformities or cyanosis. NEUROLOGIC: No focal weakness or paresthesias are detected. SKIN: There are no ulcers or rashes noted. PSYCHIATRIC: The patient has a normal affect.  STUDIES:   I have ordered and reviewed his vascular lab studies with the following findings: Right ABI = 0.7 Left ABI = 0.92  Left leg bypass is widely patent 50-9 9% stenosis within the distal stent of the right iliac artery  MEDICAL ISSUES:   Left lower extremity bypass: Remains widely patent.  He will return in 6 months for surveillance imaging Right leg claudication: I have reviewed the angiogram with the patient.  His symptoms are significant and to the point where intervention is warranted.  I will plan on angiogram with left leg access.  I will evaluate the iliac system and address it as indicated.  I will also plan on intervening in the right superficial femoral artery.  This is been scheduled for Tuesday, May 28.  The risks and benefits of the procedure were discussed with the patient.  All of his questions were answered.    Annamarie Major, MD Vascular and Vein Specialists of [facility] [phone] Pager [phone]

## 2018-04-08 DIAGNOSIS — E782 Mixed hyperlipidemia: Secondary | ICD-10-CM | POA: Diagnosis not present

## 2018-04-08 DIAGNOSIS — N401 Enlarged prostate with lower urinary tract symptoms: Secondary | ICD-10-CM | POA: Diagnosis not present

## 2018-04-08 DIAGNOSIS — R7301 Impaired fasting glucose: Secondary | ICD-10-CM | POA: Diagnosis not present

## 2018-04-08 DIAGNOSIS — I10 Essential (primary) hypertension: Secondary | ICD-10-CM | POA: Diagnosis not present

## 2018-04-08 DIAGNOSIS — E119 Type 2 diabetes mellitus without complications: Secondary | ICD-10-CM | POA: Diagnosis not present

## 2018-04-10 DIAGNOSIS — I10 Essential (primary) hypertension: Secondary | ICD-10-CM | POA: Diagnosis not present

## 2018-04-10 DIAGNOSIS — I5042 Chronic combined systolic (congestive) and diastolic (congestive) heart failure: Secondary | ICD-10-CM | POA: Diagnosis not present

## 2018-04-10 DIAGNOSIS — M545 Low back pain: Secondary | ICD-10-CM | POA: Diagnosis not present

## 2018-04-10 DIAGNOSIS — R7301 Impaired fasting glucose: Secondary | ICD-10-CM | POA: Diagnosis not present

## 2018-04-10 DIAGNOSIS — E782 Mixed hyperlipidemia: Secondary | ICD-10-CM | POA: Diagnosis not present

## 2018-04-10 DIAGNOSIS — D509 Iron deficiency anemia, unspecified: Secondary | ICD-10-CM | POA: Diagnosis not present

## 2018-04-10 DIAGNOSIS — G894 Chronic pain syndrome: Secondary | ICD-10-CM | POA: Diagnosis not present

## 2018-04-10 DIAGNOSIS — I739 Peripheral vascular disease, unspecified: Secondary | ICD-10-CM | POA: Diagnosis not present

## 2018-04-10 DIAGNOSIS — Z720 Tobacco use: Secondary | ICD-10-CM | POA: Diagnosis not present

## 2018-04-10 DIAGNOSIS — Z6829 Body mass index (BMI) 29.0-29.9, adult: Secondary | ICD-10-CM | POA: Diagnosis not present

## 2018-04-14 LAB — CUP PACEART REMOTE DEVICE CHECK
Battery Remaining Longevity: 53 mo
Brady Statistic RA Percent Paced: 12 %
HIGH POWER IMPEDANCE MEASURED VALUE: 83 Ohm
HIGH POWER IMPEDANCE MEASURED VALUE: 83 Ohm
Implantable Lead Implant Date: 20160923
Implantable Lead Location: 753858
Implantable Lead Location: 753859
Implantable Lead Location: 753860
Implantable Pulse Generator Implant Date: 20160923
Lead Channel Impedance Value: 410 Ohm
Lead Channel Impedance Value: 480 Ohm
Lead Channel Pacing Threshold Amplitude: 0.875 V
Lead Channel Pacing Threshold Amplitude: 1 V
Lead Channel Pacing Threshold Pulse Width: 0.5 ms
Lead Channel Sensing Intrinsic Amplitude: 12 mV
Lead Channel Setting Pacing Amplitude: 2 V
Lead Channel Setting Pacing Amplitude: 2.25 V
Lead Channel Setting Sensing Sensitivity: 0.5 mV
MDC IDC LEAD IMPLANT DT: 20160923
MDC IDC LEAD IMPLANT DT: 20160923
MDC IDC MSMT BATTERY REMAINING PERCENTAGE: 65 %
MDC IDC MSMT BATTERY VOLTAGE: 2.95 V
MDC IDC MSMT LEADCHNL LV PACING THRESHOLD PULSEWIDTH: 0.8 ms
MDC IDC MSMT LEADCHNL RA PACING THRESHOLD AMPLITUDE: 0.75 V
MDC IDC MSMT LEADCHNL RA SENSING INTR AMPL: 5 mV
MDC IDC MSMT LEADCHNL RV IMPEDANCE VALUE: 780 Ohm
MDC IDC MSMT LEADCHNL RV PACING THRESHOLD PULSEWIDTH: 0.5 ms
MDC IDC SESS DTM: 20190506060017
MDC IDC SET LEADCHNL LV PACING PULSEWIDTH: 0.8 ms
MDC IDC SET LEADCHNL RV PACING AMPLITUDE: 2 V
MDC IDC SET LEADCHNL RV PACING PULSEWIDTH: 0.5 ms
MDC IDC STAT BRADY AP VP PERCENT: 12 %
MDC IDC STAT BRADY AP VS PERCENT: 1 %
MDC IDC STAT BRADY AS VP PERCENT: 87 %
MDC IDC STAT BRADY AS VS PERCENT: 1 %
Pulse Gen Serial Number: 7294918

## 2018-04-22 ENCOUNTER — Ambulatory Visit (HOSPITAL_COMMUNITY)
Admission: RE | Admit: 2018-04-22 | Discharge: 2018-04-22 | Disposition: A | Discharge status: Home / Self Care | Payer: Medicare Other | Source: Ambulatory Visit | Attending: Surgery | Admitting: Surgery

## 2018-04-22 ENCOUNTER — Ambulatory Visit (HOSPITAL_COMMUNITY)
Admission: RE | Disposition: A | Discharge status: Home / Self Care | Payer: Self-pay | Source: Ambulatory Visit | Attending: Surgery

## 2018-04-22 ENCOUNTER — Encounter (HOSPITAL_COMMUNITY): Payer: Self-pay | Admitting: Surgery

## 2018-04-22 ENCOUNTER — Telehealth: Payer: Self-pay | Admitting: Surgery

## 2018-04-22 DIAGNOSIS — Z833 Family history of diabetes mellitus: Secondary | ICD-10-CM | POA: Insufficient documentation

## 2018-04-22 DIAGNOSIS — I509 Heart failure, unspecified: Secondary | ICD-10-CM | POA: Diagnosis not present

## 2018-04-22 DIAGNOSIS — J449 Chronic obstructive pulmonary disease, unspecified: Secondary | ICD-10-CM | POA: Diagnosis not present

## 2018-04-22 DIAGNOSIS — D126 Benign neoplasm of colon, unspecified: Secondary | ICD-10-CM | POA: Diagnosis not present

## 2018-04-22 DIAGNOSIS — I70211 Atherosclerosis of native arteries of extremities with intermittent claudication, right leg: Secondary | ICD-10-CM | POA: Insufficient documentation

## 2018-04-22 DIAGNOSIS — Z8042 Family history of malignant neoplasm of prostate: Secondary | ICD-10-CM | POA: Diagnosis not present

## 2018-04-22 DIAGNOSIS — Z79899 Other long term (current) drug therapy: Secondary | ICD-10-CM | POA: Insufficient documentation

## 2018-04-22 DIAGNOSIS — F17210 Nicotine dependence, cigarettes, uncomplicated: Secondary | ICD-10-CM | POA: Diagnosis not present

## 2018-04-22 DIAGNOSIS — Z8249 Family history of ischemic heart disease and other diseases of the circulatory system: Secondary | ICD-10-CM | POA: Diagnosis not present

## 2018-04-22 DIAGNOSIS — I110 Hypertensive heart disease with heart failure: Secondary | ICD-10-CM | POA: Insufficient documentation

## 2018-04-22 DIAGNOSIS — K7030 Alcoholic cirrhosis of liver without ascites: Secondary | ICD-10-CM | POA: Insufficient documentation

## 2018-04-22 DIAGNOSIS — I252 Old myocardial infarction: Secondary | ICD-10-CM | POA: Insufficient documentation

## 2018-04-22 DIAGNOSIS — Z7982 Long term (current) use of aspirin: Secondary | ICD-10-CM | POA: Diagnosis not present

## 2018-04-22 DIAGNOSIS — Z7901 Long term (current) use of anticoagulants: Secondary | ICD-10-CM | POA: Insufficient documentation

## 2018-04-22 DIAGNOSIS — Z8673 Personal history of transient ischemic attack (TIA), and cerebral infarction without residual deficits: Secondary | ICD-10-CM | POA: Insufficient documentation

## 2018-04-22 DIAGNOSIS — E785 Hyperlipidemia, unspecified: Secondary | ICD-10-CM | POA: Insufficient documentation

## 2018-04-22 DIAGNOSIS — Z95810 Presence of automatic (implantable) cardiac defibrillator: Secondary | ICD-10-CM | POA: Insufficient documentation

## 2018-04-22 HISTORY — PX: ABDOMINAL AORTOGRAM W/LOWER EXTREMITY: CATH118223

## 2018-04-22 HISTORY — PX: PERIPHERAL VASCULAR BALLOON ANGIOPLASTY: CATH118281

## 2018-04-22 HISTORY — PX: PERIPHERAL VASCULAR ATHERECTOMY: CATH118256

## 2018-04-22 LAB — POCT I-STAT, CHEM 8
BUN: 12 mg/dL (ref 6–20)
CALCIUM ION: 1.15 mmol/L (ref 1.15–1.40)
CHLORIDE: 100 mmol/L — AB (ref 101–111)
Creatinine, Ser: 0.6 mg/dL — ABNORMAL LOW (ref 0.61–1.24)
Glucose, Bld: 107 mg/dL — ABNORMAL HIGH (ref 65–99)
HCT: 43 % (ref 39.0–52.0)
Hemoglobin: 14.6 g/dL (ref 13.0–17.0)
Potassium: 3.8 mmol/L (ref 3.5–5.1)
Sodium: 137 mmol/L (ref 135–145)
TCO2: 25 mmol/L (ref 22–32)

## 2018-04-22 LAB — POCT ACTIVATED CLOTTING TIME
ACTIVATED CLOTTING TIME: 324 s
ACTIVATED CLOTTING TIME: 252 s
ACTIVATED CLOTTING TIME: 197 s
Activated Clotting Time: 169 seconds

## 2018-04-22 SURGERY — ABDOMINAL AORTOGRAM W/LOWER EXTREMITY
Anesthesia: LOCAL | Laterality: Right

## 2018-04-22 MED ORDER — MIDAZOLAM HCL 2 MG/2ML IJ SOLN
INTRAMUSCULAR | Status: AC
  Filled 2018-04-22: qty 2

## 2018-04-22 MED ORDER — ACETAMINOPHEN 325 MG PO TABS: 650.0000 mg | ORAL_TABLET | ORAL | Status: DC | PRN

## 2018-04-22 MED ORDER — HEPARIN (PORCINE) IN NACL 1000-0.9 UT/500ML-% IV SOLN
INTRAVENOUS | Status: AC
  Filled 2018-04-22: qty 500

## 2018-04-22 MED ORDER — FENTANYL CITRATE (PF) 100 MCG/2ML IJ SOLN
INTRAMUSCULAR | Status: DC | PRN
  Administered 2018-04-22 (×3): 25 ug via INTRAVENOUS

## 2018-04-22 MED ORDER — ONDANSETRON HCL 4 MG/2ML IJ SOLN: 4.0000 mg | Freq: Four times a day (QID) | INTRAMUSCULAR | Status: DC | PRN

## 2018-04-22 MED ORDER — LABETALOL HCL 5 MG/ML IV SOLN: 10.0000 mg | INTRAVENOUS | Status: DC | PRN

## 2018-04-22 MED ORDER — LIDOCAINE HCL (PF) 1 % IJ SOLN
INTRAMUSCULAR | Status: AC
  Filled 2018-04-22: qty 30

## 2018-04-22 MED ORDER — SODIUM CHLORIDE 0.9% FLUSH: 3.0000 mL | INTRAVENOUS | Status: DC | PRN

## 2018-04-22 MED ORDER — HEPARIN SODIUM (PORCINE) 1000 UNIT/ML IJ SOLN
INTRAMUSCULAR | Status: DC | PRN
  Administered 2018-04-22: 7500 [IU] via INTRAVENOUS

## 2018-04-22 MED ORDER — SODIUM CHLORIDE 0.9% FLUSH: 3.0000 mL | Freq: Two times a day (BID) | INTRAVENOUS | Status: DC

## 2018-04-22 MED ORDER — HYDRALAZINE HCL 20 MG/ML IJ SOLN: 5.0000 mg | INTRAMUSCULAR | Status: DC | PRN

## 2018-04-22 MED ORDER — MORPHINE SULFATE (PF) 10 MG/ML IV SOLN: 2.0000 mg | INTRAVENOUS | Status: DC | PRN

## 2018-04-22 MED ORDER — SODIUM CHLORIDE 0.9 % IV SOLN
INTRAVENOUS | Status: DC
  Administered 2018-04-22: 06:00:00 via INTRAVENOUS

## 2018-04-22 MED ORDER — OXYCODONE HCL 5 MG PO TABS: 5.0000 mg | ORAL_TABLET | ORAL | Status: DC | PRN

## 2018-04-22 MED ORDER — SODIUM CHLORIDE 0.9 % IV SOLN: 250.0000 mL | INTRAVENOUS | Status: DC | PRN

## 2018-04-22 MED ORDER — FENTANYL CITRATE (PF) 100 MCG/2ML IJ SOLN
INTRAMUSCULAR | Status: AC
  Filled 2018-04-22: qty 2

## 2018-04-22 MED ORDER — LIDOCAINE HCL (PF) 1 % IJ SOLN
INTRAMUSCULAR | Status: DC | PRN
  Administered 2018-04-22: 20 mL

## 2018-04-22 MED ORDER — MIDAZOLAM HCL 2 MG/2ML IJ SOLN
INTRAMUSCULAR | Status: DC | PRN
  Administered 2018-04-22 (×3): 1 mg via INTRAVENOUS

## 2018-04-22 MED ORDER — HEPARIN SODIUM (PORCINE) 1000 UNIT/ML IJ SOLN
INTRAMUSCULAR | Status: AC
  Filled 2018-04-22: qty 1

## 2018-04-22 MED ORDER — HEPARIN (PORCINE) IN NACL 2-0.9 UNITS/ML
INTRAMUSCULAR | Status: AC | PRN
  Administered 2018-04-22 (×2): 500 mL via INTRA_ARTERIAL

## 2018-04-22 MED ORDER — IODIXANOL 320 MG/ML IV SOLN
INTRAVENOUS | Status: DC | PRN
  Administered 2018-04-22: 165 mL via INTRA_ARTERIAL

## 2018-04-22 MED ORDER — SODIUM CHLORIDE 0.9 % IV SOLN: INTRAVENOUS | Status: AC

## 2018-04-22 SURGICAL SUPPLY — 32 items
BAG SNAP BAND KOVER 36X36 (MISCELLANEOUS) ×1 IMPLANT
BALLN LUTONIX DCB 5X60X130 (BALLOONS) ×3
BALLN MUSTANG 5X60X135 (BALLOONS) ×3
BALLN MUSTANG 7.0X40 75 (BALLOONS) ×3
BALLOON LUTONIX DCB 5X60X130 (BALLOONS) IMPLANT
BALLOON MUSTANG 5X60X135 (BALLOONS) IMPLANT
BALLOON MUSTANG 7.0X40 75 (BALLOONS) IMPLANT
BUR JETSTREAM XC 2.4/3.4 (BURR) IMPLANT
BURR JETSTREAM XC 2.4/3.4 (BURR) ×3
CATH OMNI FLUSH 5F 65CM (CATHETERS) ×1 IMPLANT
CATH QUICKCROSS SUPP .035X90CM (MICROCATHETER) ×1 IMPLANT
COVER DOME SNAP 22 D (MISCELLANEOUS) ×1 IMPLANT
COVER PRB 48X5XTLSCP FOLD TPE (BAG) IMPLANT
COVER PROBE 5X48 (BAG) ×3
DEVICE CONTINUOUS FLUSH (MISCELLANEOUS) ×1 IMPLANT
DEVICE EMBOSHIELD NAV6 4.0-7.0 (FILTER) ×1 IMPLANT
DEVICE TORQUE H2O (MISCELLANEOUS) ×1 IMPLANT
GUIDEWIRE ANGLED .035X150CM (WIRE) ×1 IMPLANT
KIT ENCORE 26 ADVANTAGE (KITS) ×1 IMPLANT
KIT MICROPUNCTURE NIT STIFF (SHEATH) ×1 IMPLANT
KIT PV (KITS) ×3 IMPLANT
LUBRICANT VIPERSLIDE CORONARY (MISCELLANEOUS) ×1 IMPLANT
SHEATH AVANTI 11CM 5FR (SHEATH) ×1 IMPLANT
SHEATH PINNACLE ST 7F 45CM (SHEATH) ×1 IMPLANT
SHIELD RADPAD SCOOP 12X17 (MISCELLANEOUS) ×1 IMPLANT
SYR MEDRAD MARK V 150ML (SYRINGE) ×3 IMPLANT
TAPE VIPERTRACK RADIOPAQ (MISCELLANEOUS) IMPLANT
TAPE VIPERTRACK RADIOPAQUE (MISCELLANEOUS) ×3
TRANSDUCER W/STOPCOCK (MISCELLANEOUS) ×3 IMPLANT
TRAY PV CATH (CUSTOM PROCEDURE TRAY) ×3 IMPLANT
WIRE BAREWIRE WORK .014X315CM (WIRE) ×1 IMPLANT
WIRE BENTSON .035X145CM (WIRE) ×2 IMPLANT

## 2018-04-22 NOTE — Op Note (Signed)
Patient name: RONAK DUQUETTE MRN: [id number] DOB: [birth year] Sex: male  04/22/2018 Pre-operative Diagnosis: right leg claudication Post-operative diagnosis:  Same Surgeon:  Annamarie Major Procedure Performed:  1.  U/s guided access left femoral artery  2.  Abdominal aortogram  3.  Bilateral lower extremity runoff  4.  Atherectomy and drug coated balloon angioplasty, right superficial femoral artery  5.  Angioplasty, right external iliac artery  6.  Conscious sedation (87 minutes)     Indications: This is a 72 year old gentleman who has previously undergone bilateral iliac stenting and a left femoral-popliteal bypass graft for claudication.  He has had worsening symptoms in his right leg.  Ultrasound identified a stenosis within his right iliac stent.  He is here today for further evaluation.  Procedure:  The patient was identified in the holding area and taken to room 8.  The patient was then placed supine on the table and prepped and draped in the usual sterile fashion.  A time out was called.  Conscious sedation was administered with the use of IV fentanyl and Versed under continuous physician and nurse monitoring.  Heart rate, blood pressure, and oxygen saturation were continuously monitored.  Ultrasound was used to evaluate the left common femoral artery.  It was patent .  A digital ultrasound image was acquired.  A micropuncture needle was used to access the left common femoral artery under ultrasound guidance.  An 018 wire was advanced without resistance and a micropuncture sheath was placed.  The 018 wire was removed and a benson wire was placed.  The micropuncture sheath was exchanged for a 5 french sheath.  An omniflush catheter was advanced over the wire to the level of L-1.  An abdominal angiogram was obtained.  Next, using the omniflush catheter and a benson wire, the aortic bifurcation was crossed and the catheter was placed into theright external iliac artery and right runoff was  obtained.  left runoff was performed via retrograde sheath injections.  Findings:   Aortogram: No significant renal artery stenosis is identified.  The infrarenal abdominal aorta is widely patent.  The left common and external iliac artery are widely patent.  There is no iliac stent stenosis.  The right common iliac artery is widely patent.  The right external iliac artery and a stent are patent however there is a 60% stenosis within the iliac stent.  Right Lower Extremity: The right common femoral profunda femoral artery are patent throughout their course.  The superficial femoral arteries patent however there is a near total occlusion in the midportion for a short distance of about 3 cm.  There is three-vessel runoff to the ankle however the posterior tibials dominant vessel across the ankle    Left Lower Extremity: Visualized due to patient movement.  Left common femoral, femoral artery are patent throughout the course.  The femoral-popliteal bypass graft was widely patent to the tibial vessels are not well  Intervention: After the above images were acquired the decision was made to proceed with intervention.  A 7 x 45 cm sheath was advanced into the right external iliac artery.  The patient was fully heparinized.  I then used a 035 Glidewire and quick cross to cross the stenosis.  A contrast injection was performed with a catheter in the popliteal artery confirming successful crossing of the lesion.  A 014 bare wire was then inserted through the quick cross catheter which was then removed.  I then used a large NAV 6 filter which was deployed  in the popliteal artery.  I selected the jetstream device and perform atherectomy of the superficial femoral artery.  I made 2 passes with plates down and then 1 past blade.  I then performed primary balloon angioplasty using a 5 x 60 balloon.  The balloon was inflated to 6 atm for 2 minutes.  Completion imaging showed resolution of the stenosis.  I then inserted a 5  x 60 drug-coated Lutonix balloon for 2 minutes.  Completion imaging revealed no evidence of stenosis.  The filter was then retrieved.  He was found to be intact.  Completion imaging of the right leg was performed which showed no change in runoff from preintervention.  Attention was then turned towards the in-stent stenosis in the right external iliac artery.  I selected a 7 x 40 balloon and performed balloon angioplasty of the in-stent stenosis taking the balloon to 16 atm for 1 minute.  Follow-up imaging revealed resolution of the stenosis, now measuring less than 10%.  The sheath was then withdrawn to the left external iliac artery.  The patient was taken the whole year for sheath pull once the coagulation profile corrects.  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 Annamarie Major, M.D. [facility] Special[facility] of [place] Office: [phone] Pager:  [phone]

## 2018-04-22 NOTE — Interval H&P Note (Signed)
History and Physical Interval Note:  04/22/2018 7:30 AM  Roy [name]  has presented today for surgery, with the diagnosis of pvd  The various methods of treatment have been discussed with the patient and family. After consideration of risks, benefits and other options for treatment, the patient has consented to  Procedure(s): ABDOMINAL AORTOGRAM W/LOWER EXTREMITY ([name]) as a surgical intervention .  The patient's history has been reviewed, patient examined, no change in status, stable for surgery.  I have reviewed the patient's chart and labs.  Questions were answered to the patient's satisfaction.     Annamarie Major

## 2018-04-22 NOTE — Telephone Encounter (Signed)
sch appt spk to pt 05/30/18 130pm Le Arterial 230pm ABI 345 pm f/u NP  s/p Abd aortogram, LE Bil runoff, Artherectomy angioplasty, angioplasty R external iliac art

## 2018-04-22 NOTE — Progress Notes (Signed)
Site area:Left groin a7 french arterial sheath was removed  Site Prior to Removal:  Level 0  Pressure Applied For 20 MINUTES    Bedrest Beginning at  1130am  Manual:   Yes.    Patient Status During Pull:  stable  Post Pull Groin Site:  Level 0  Post Pull Instructions Given:  Yes.    Post Pull Pulses Present:  Yes.    Dressing Applied:  Yes.    Comments:  VS remain stable

## 2018-04-22 NOTE — Discharge Instructions (Signed)

## 2018-04-23 MED FILL — Heparin Sod (Porcine)-NaCl IV Soln 1000 Unit/500ML-0.9%: INTRAVENOUS | Qty: 1000 | Status: AC

## 2018-04-24 ENCOUNTER — Other Ambulatory Visit: Payer: Self-pay

## 2018-04-24 DIAGNOSIS — I70213 Atherosclerosis of native arteries of extremities with intermittent claudication, bilateral legs: Secondary | ICD-10-CM

## 2018-04-24 DIAGNOSIS — Z48812 Encounter for surgical aftercare following surgery on the circulatory system: Secondary | ICD-10-CM

## 2018-04-25 ENCOUNTER — Ambulatory Visit (HOSPITAL_COMMUNITY): Payer: Medicare Other | Attending: Internal Medicine

## 2018-04-25 ENCOUNTER — Encounter (HOSPITAL_COMMUNITY): Payer: Self-pay

## 2018-04-25 DIAGNOSIS — M62838 Other muscle spasm: Secondary | ICD-10-CM

## 2018-04-25 DIAGNOSIS — M542 Cervicalgia: Secondary | ICD-10-CM

## 2018-04-25 DIAGNOSIS — R293 Abnormal posture: Secondary | ICD-10-CM | POA: Diagnosis not present

## 2018-04-25 NOTE — Patient Instructions (Signed)
Access Code: [email or web address]  URL: [email or web address]  Date: 04/25/2018  Prepared by: Geraldine Solar   Exercises Seated Upper Trapezius Stretch - 3-5 reps - 30-60 hold - 1x daily - 7x weekly Gentle Levator Scapulae Stretch - 3-5 reps - 30-60 hold - 1x daily - 7x weekly

## 2018-04-25 NOTE — Therapy (Signed)
[facility] [place], [place], [address] Phone: [phone]   Fax:  [phone]  Physical Therapy Evaluation  Patient Details  Name: Roy [name] MRN: [id number] Date of Birth: 07-Sep-1946 Referring Provider: Celene Squibb, MD   Encounter Date: 04/25/2018  PT End of Session - 04/25/18 1127    Visit Number  1    Number of Visits  9    Date for PT Re-Evaluation  05/23/18    Authorization Type  Medicare (Secondary: T[employer]are)    Authorization Time Period  04/25/18 to 05/23/18    Authorization - Visit Number  1    Authorization - Number of Visits  10    PT Start Time  0946    PT Stop Time  1025    PT Time Calculation (min)  39 min    Activity Tolerance  Patient tolerated treatment well;No increased pain    Behavior During Therapy  WFL for tasks assessed/performed       Past Medical History:  Diagnosis Date  . AICD (automatic cardioverter/defibrillator) present 08/19/2015   St Jude BiV ICD for primary prevention by Dr. Lovena Le  . Alcohol abuse    6 beers per day; hospital admission in 2009 for withdrawal symptoms  . Alcoholic cirrhosis ([place])   . Anxiety and depression   . Cerebrovascular disease 2009   TIA; 2009- right ICA stent; re-intervention for restenosis complicated by [facility] w/o sx  . CHF (congestive heart failure) ([place]) 06-02-14  . Chronic obstructive pulmonary disease ([place])   . Degenerative joint disease    Total shoulder arthroplasty-right  . Hyperlipidemia   . Hypertension   . LBBB (left bundle branch block)    Normal echo-2011; stress nuclear in 09/2010--septal hypoperfusion representing nontransmural infarction or the effect of left bundle branch block, no ischemia  . Myocardial infarction [facility]) June 02, 2014   Massive Heart Attack  . Peripheral vascular disease ([place])   . 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  . Thrombocytopenia ([place])   . Tobacco abuse    -100 pack years; 1.5 packs per day  .  Traumatic seroma of thigh ([place])    left  . Tubular adenoma of colon     Past Surgical History:  Procedure Laterality Date  . ABDOMINAL AORTAGRAM [name] 05/04/2014   Procedure: ABDOMINAL Maxcine Ham;  Surgeon: [name], MD;  Location: [facility] CATH LAB;  Service: Cardiovascular;  Laterality: [name];  . ABDOMINAL AORTOGRAM [name] 06/25/2017   Procedure: Abdominal Aortogram;  Surgeon: [name], MD;  Location: [facility] CV LAB;  Service: Cardiovascular;  Laterality: [name];  . ABDOMINAL AORTOGRAM W/LOWER EXTREMITY [name] 04/22/2018   Procedure: ABDOMINAL AORTOGRAM W/LOWER EXTREMITY;  Surgeon: [name], MD;  Location: [facility] CV LAB;  Service: Cardiovascular;  Laterality: [name];  . AGILE CAPSULE [name] 03/18/2014   Procedure: AGILE CAPSULE;  Surgeon: Danie Binder, MD;  [facility];  Service: Endoscopy;  Laterality: [name];  7:30  . APPLICATION OF WOUND VAC Left 09/03/2017   Procedure: APPLICATION OF WOUND VAC;  Surgeon: Conrad Yalaha, MD;  Location: [facility];  Service: Vascular;  Laterality: Left;  . BACK SURGERY    . BACTERIAL OVERGROWTH TEST [name] 05/24/2015   Procedure: BACTERIAL OVERGROWTH TEST;  Surgeon: Danie Binder, MD;  [facility];  Service: Endoscopy;  Laterality: [name];  0700  . BI-VENTRICULAR IMPLANTABLE CARDIOVERTER DEFIBRILLATOR  (CRT-D)  08/19/2015  . CARPAL TUNNEL RELEASE Left 02/02/2016   Procedure:  LEFT CARPAL TUNNEL RELEASE;  Surgeon: Daryll Brod, MD;  Location: [facility];  Service: Orthopedics;  Laterality: Left;  ANESTHESIA: IV REGIONAL UPPER ARM  . CATARACT EXTRACTION W/PHACO Right 03/08/2015   Procedure: CATARACT EXTRACTION PHACO AND INTRAOCULAR LENS PLACEMENT (IOC);  Surgeon: Rutherford Guys, MD;  Location: [facility] OR[facility];  Service: Ophthalmology;  Laterality: Right;  CDE:9.46  . CATARACT EXTRACTION W/PHACO Left 03/22/2015   Procedure: CATARACT EXTRACTION PHACO AND INTRAOCULAR LENS PLACEMENT (IOC);  Surgeon: Rutherford Guys, MD;  Location: [facility];  Service:  Ophthalmology;  Laterality: Left;  CDE:5.80  . COLONOSCOPY  08/22/09   Fields-(Tubular Adenoma)3-mm transverse polyp/4-mm polyp otherwise noraml/small internal hemorrhoids  . COLONOSCOPY [name] 04/14/2014   BPZ:WCHEN internal hemorrhids/normal mocsa in the terminal iluem/left colonis redundant  . COLONOSCOPY W/ POLYPECTOMY  2011  . ENDARTERECTOMY FEMORAL Left 08/16/2017   Procedure: ENDARTERECTOMY LEFT PROFUNDA FEMORAL;  Surgeon: [name], MD;  Location: [facility];  Service: Vascular;  Laterality: Left;  . EP IMPLANTABLE DEVICE [name] 08/19/2015   Procedure: BiV ICD Insertion CRT-D;  Surgeon: Evans Lance, MD;  Location: [facility] CV LAB;  Service: Cardiovascular;  Laterality: [name];  . ESOPHAGOGASTRODUODENOSCOPY [name] 03/05/2014   SLF: 1. Stricture at the gastroesophagael junction 2. Small hiatal hernia 3. Moderate non-erosive gastritis and duodentitis. 4. No surce for Melena identified.   . FEMORAL-POPLITEAL BYPASS GRAFT Left 08/16/2017   Procedure: LEFT  FEMORAL-POPLITEAL ARTERY  BYPASS GRAFT;  Surgeon: [name], MD;  Location: [facility];  Service: Vascular;  Laterality: Left;  . GIVENS CAPSULE STUDY [name] 03/30/2014   Procedure: GIVENS CAPSULE STUDY;  Surgeon: Danie Binder, MD;  [facility];  Service: Endoscopy;  Laterality: [name];  7:30  . I&D EXTREMITY Left 09/03/2017   Procedure: IRRIGATION AND DEBRIDEMENT EXTREMITY LEFT THIGH SEROMA;  Surgeon: Conrad Wickliffe, MD;  Location: [facility] OR;  Service: Vascular;  Laterality: Left;  . IR ANGIO INTRA EXTRACRAN SEL COM CAROTID INNOMINATE BILAT MOD SED  07/16/2017  . IR ANGIO VERTEBRAL SEL SUBCLAVIAN INNOMINATE BILAT MOD SED  07/16/2017  . IR RADIOLOGIST EVAL & MGMT  04/01/2017  . IR RADIOLOGIST EVAL & MGMT  08/02/2017  . JOINT REPLACEMENT Right   . LOWER EXTREMITY ANGIOGRAPHY Bilateral 06/25/2017   Procedure: Lower Extremity Angiography;  Surgeon: [name], MD;  Location: [facility] CV LAB;  Service: Cardiovascular;  Laterality: Bilateral;  .  LUMBAR FUSION  2010  . PERIPHERAL VASCULAR ATHERECTOMY Right 04/22/2018   Procedure: PERIPHERAL VASCULAR ATHERECTOMY;  Surgeon: [name], MD;  Location: [facility] CV LAB;  Service: Cardiovascular;  Laterality: Right;  right superficial femoral  . PERIPHERAL VASCULAR BALLOON ANGIOPLASTY Right 04/22/2018   Procedure: PERIPHERAL VASCULAR BALLOON ANGIOPLASTY;  Surgeon: [name], MD;  Location: [facility] CV LAB;  Service: Cardiovascular;  Laterality: Right;  external iliac  . TOTAL SHOULDER ARTHROPLASTY Right 2011   Dr. Tamera Punt  . ULNAR NERVE TRANSPOSITION Left 02/02/2016   Procedure: LEFT IN-SITU DECOMPRESSION ULNAR NERVE ;  Surgeon: Daryll Brod, MD;  Location: [facility];  Service: Orthopedics;  Laterality: Left;  . ULNAR TUNNEL RELEASE Left 02/02/2016   Procedure: LEFT CUBITAL TUNNEL RELEASE;  Surgeon: Daryll Brod, MD;  Location: [facility];  Service: Orthopedics;  Laterality: Left;  [name] VASECTOMY  1971  . VEIN HARVEST Left 08/16/2017   Procedure: USING NON REVERSE LEFT GREATER SAPHENOUS VEIN HARVEST;  Surgeon: [name], MD;  Location: [facility];  Service: Vascular;  Laterality:  Left;    There were no vitals filed for this visit.   Subjective Assessment - 04/25/18 0954    Subjective  Pt states he has been having neck pain for over 2 months. At first he thought it was a crick in his neck; he has tried multiple different pillow varations with no success. He was recommended for chiropractic but his insurance wouldn't cover it. He denies any trauma, it started insidiously and it is on his R side. He denies any b/b or radicular symptoms down RUE. If he turns to the R or extends his neck his pain increases, but if he turns to the L he doesn't have pain. He had a R TSA about 4-5 years ago; he has pain at times with it, mainly when he uses it a lot. He denies any relieving factors for his neck.     Pertinent History  R TSA    Limitations  Lifting    How long  can you sit comfortably?  no issues    How long can you stand comfortably?  no issues    How long can you walk comfortably?  no issues    Patient Stated Goals  not as much to no pain     Currently in Pain?  Yes    Pain Score  3     Pain Location  Neck    Pain Orientation  Right    Pain Descriptors / Indicators  Dull;Aching    Pain Type  Chronic pain    Pain Onset  More than a month ago    Pain Frequency  Constant    Aggravating Factors   rotating R, extending    Pain Relieving Factors  nothing    Effect of Pain on Daily Activities  increases         [facility] PT Assessment - 04/25/18 0001      Assessment   Medical Diagnosis  Neck Pain    Referring Provider  Celene Squibb, MD    Onset Date/Surgical Date  02/23/18    Hand Dominance  Right    Next MD Visit  6 months    Prior Therapy  PT for back, R TSA, "all kinds of stuff"      Balance Screen   Has the patient fallen in the past 6 months  No    Has the patient had a decrease in activity level because of a fear of falling?   No    Is the patient reluctant to leave their home because of a fear of falling?   No      Prior Function   Level of Independence  Independent    Vocation  Retired    Leisure  piddle with different things; painting his car      Observation/Other Assessments   Focus on Therapeutic Outcomes (FOTO)   50% limitation    Other Surveys   Other Surveys    Neck Disability Index   6/50 (mild perceived disability)      Posture/Postural Control   Posture/Postural Control  Postural limitations    Postural Limitations  Rounded Shoulders;Forward head;Increased thoracic kyphosis      ROM / Strength   AROM / PROM / Strength  AROM;Strength      AROM   AROM Assessment Site  Cervical    Cervical Flexion  66    Cervical Extension  22    Cervical - Right Side Bend  14    Cervical - Left Side  Bend  22    Cervical - Right Rotation  51    Cervical - Left Rotation  61      Strength   Strength Assessment Site   Shoulder;Elbow;Wrist;Hand    Right Shoulder Flexion  5/5    Right Shoulder ABduction  4+/5    Right Shoulder Internal Rotation  5/5    Right Shoulder External Rotation  4+/5    Left Shoulder Flexion  5/5    Left Shoulder ABduction  4+/5    Left Shoulder Internal Rotation  5/5    Left Shoulder External Rotation  4+/5    Right Elbow Flexion  5/5    Right Elbow Extension  5/5    Left Elbow Flexion  5/5    Left Elbow Extension  5/5    Right Wrist Flexion  5/5    Right Wrist Extension  5/5    Left Wrist Flexion  5/5    Left Wrist Extension  5/5    Right Hand Gross Grasp  Functional    Right Hand Grip (lbs)  79    Left Hand Gross Grasp  Functional    Left Hand Grip (lbs)  70      Palpation   Spinal mobility  hypomobile throughout cervical and thoracic spine    Palpation comment  mod-max restrictions of R upper trap and levator scap with palpable taut bands in each; pt stating that palpation to these trigger points/taut bands recreated his same pain      Special Tests    Special Tests  Cervical    Cervical Tests  --            Objective measurements completed on examination: See above findings.         PT Education - 04/25/18 1127    Education Details  exam findings, POC, HEP    Person(s) Educated  Patient    Methods  Demonstration;Explanation;Handout    Comprehension  Verbalized understanding;Returned demonstration       PT Short Term Goals - 04/25/18 1145      PT SHORT TERM GOAL #1   Title  Pt will be independent with HEP and perform consistently in order to decrease pain and improve function.    Time  2    Period  Weeks    Status  New    Target Date  05/09/18      PT SHORT TERM GOAL #2   Title  Pt will have improved cervical AROM by 5 deg throughout in order to decrease pain and improve overall function.    Time  2    Period  Weeks    Status  New        PT Long Term Goals - 04/25/18 1145      PT LONG TERM GOAL #1   Title  Pt will have improved  cervical AROM by 10 deg in cervical extension and bil rotation in order to decrease pain and maximize his ability to drive with greater ease.     Time  4    Period  Weeks    Status  New    Target Date  05/23/18      PT LONG TERM GOAL #2   Title  Pt will have at least 4+/5 cervical MMT in order to decrease pain and maximize his ability to perform household and community responsibilities without pain and with greater ease.    Time  4    Period  Weeks  Status  New      PT LONG TERM GOAL #3   Title  Pt will demo mild soft tissue restrictions of R upper trap and R levator scap in order to decrease pain and improve overall cervical ROM.    Time  4    Period  Weeks    Status  New             Plan - 04/25/18 1136    Clinical Impression Statement  Pt is pleasant 72 YO M who presets to OPPT with c/o R sided neck pain of insidious onset. He denies any radicular symptoms, previous trauma, or prior surgeries on his neck. He currently presents with deficits in cervical ROM, mobility, soft tissue flexibility, posture, along with increased soft tissue restrictions of R sided cervical musculature. Pt reporting that active cervical extension and R rotation along with palpation to R upper trap and levator scap all recreate his pain, indicating deficits in these muscles and more than likely contributing to his current complaints. Pt needs skilled PT intervention to address these deficits in order decrease pain and improved overall function. Pt has pacemaker and defibrillator in place therefore, the BORG RPE scale should be utilized when determining intensity of exercises.    History and Personal Factors relevant to plan of care:  h/o Lx fusion approximately 15-20 YA, R TSA 5YA, HTN, pacemaker and defibrillator    Clinical Presentation  Stable    Clinical Presentation due to:  ROM, joint hypomobility, MMT, flexibility, palpatin, soft tissue restrictions, pain, h/o MI, CHF, COPD, CVD LBBB    Clinical  Decision Making  Low    Rehab Potential  Good    PT Frequency  2x / week    PT Duration  4 weeks    PT Treatment/Interventions  ADLs/Self Care Home Management;Cryotherapy;Electrical Stimulation;Moist Heat;Ultrasound;Functional mobility training;Therapeutic activities;Therapeutic exercise;Neuromuscular re-education;Patient/family education;Manual techniques;Passive range of motion;Dry needling;Energy conservation;Taping    PT Next Visit Plan  review goals and HEP; assess cervical MMT; perform dry needling for upper trap and levator scap; postural education and strenghtening, stretching, initiate cervical retractions    PT Home Exercise Plan  eval: seated upper trap and levator scap stretching on R    Consulted and Agree with Plan of Care  Patient       Patient will benefit from skilled therapeutic intervention in order to improve the following deficits and impairments:  Decreased range of motion, Decreased strength, Hypomobility, Increased fascial restricitons, Increased muscle spasms, Impaired flexibility, Impaired UE functional use, Improper body mechanics, Postural dysfunction, Pain  Visit Diagnosis: Cervicalgia - Plan: PT plan of care cert/re-cert  Abnormal posture - Plan: PT plan of care cert/re-cert  Other muscle spasm - Plan: PT plan of care cert/re-cert     Problem List Patient Active Problem List   Diagnosis Date Noted  . Complication of surgical procedure 09/03/2017  . History of alcohol abuse 08/19/2017  . PAD (peripheral artery disease) ([place]) 08/16/2017  . ICD (implantable cardioverter-defibrillator), biventricular, in situ 12/01/2015  . Cardiomyopathy, ischemic 08/20/2015  . Normocytic anemia 10/14/2014  . Weight gain 06/29/2014  . Hypokalemia 06/29/2014  . Chronic systolic heart failure ([place]) 06/15/2014  . COPD exacerbation ([place]) 06/15/2014  . Hypernatremia 06/15/2014  . Acute renal failure ([place]) 06/15/2014  . Encephalopathy 06/15/2014  . Loose stools 06/15/2014   . Respiratory failure ([place]) 06/03/2014  . Acute respiratory failure ([place]) 06/03/2014  . Pneumonia, organism unspecified(486) 06/03/2014  . Elevated troponin 06/03/2014  . NSTEMI (non-ST elevated myocardial infarction) (  [place]) 06/03/2014  . Acute on chronic diastolic heart failure ([place]) 06/03/2014  . Peripheral vascular disease, unspecified ([place]) 04/26/2014  . Melena 03/03/2014  . Cirrhosis, alcoholic ([place]) 38/18/4037  . Alcohol withdrawal delirium ([place]) 10/09/2012  . Colon adenomas 06/18/2012  . Degenerative joint disease   . LBBB (left bundle branch block)   . Tobacco abuse   . Alcohol dependence ([place])   . Thrombocytopenia ([place])   . Hypertensive heart disease   . Anxiety and depression   . Hyperlipidemia 10/03/2010  . CEREBROVASCULAR DISEASE 10/03/2010  . CHRONIC OBSTRUCTIVE PULMONARY DISEASE 10/03/2010  . DEGENERATIVE JOINT DISEASE, SHOULDER 10/02/2007        Geraldine Solar PT, DPT  [facility] [address] [place], [place], [address] Phone: [phone]   Fax:  [phone]  Name: Roy [name] MRN: [id number] Date of Birth: May 14, 1946

## 2018-04-30 ENCOUNTER — Ambulatory Visit (HOSPITAL_COMMUNITY): Payer: Medicare Other | Attending: Internal Medicine

## 2018-04-30 DIAGNOSIS — M62838 Other muscle spasm: Secondary | ICD-10-CM | POA: Diagnosis not present

## 2018-04-30 DIAGNOSIS — M542 Cervicalgia: Secondary | ICD-10-CM | POA: Insufficient documentation

## 2018-04-30 DIAGNOSIS — R293 Abnormal posture: Secondary | ICD-10-CM

## 2018-04-30 NOTE — Therapy (Signed)
[facility] [place], [place], [address] Phone: [phone]   Fax:  [phone]  Physical Therapy Treatment  Patient Details  Name: Roy [name] MRN: [id number] Date of Birth: January 23, 1946 Referring Provider: Celene Squibb, MD   Encounter Date: 04/30/2018  PT End of Session - 04/30/18 1258    Visit Number  2    Number of Visits  9    Date for PT Re-Evaluation  05/23/18    Authorization Type  Medicare (Secondary: T[employer]are)    Authorization Time Period  04/25/18 to 05/23/18    Authorization - Visit Number  2    Authorization - Number of Visits  10    PT Start Time  4818    PT Stop Time  5631    PT Time Calculation (min)  40 min    Activity Tolerance  Patient tolerated treatment well;No increased pain    Behavior During Therapy  WFL for tasks assessed/performed       Past Medical History:  Diagnosis Date  . AICD (automatic cardioverter/defibrillator) present 08/19/2015   St Jude BiV ICD for primary prevention by Dr. Lovena Le  . Alcohol abuse    6 beers per day; hospital admission in 2009 for withdrawal symptoms  . Alcoholic cirrhosis ([place])   . Anxiety and depression   . Cerebrovascular disease 2009   TIA; 2009- right ICA stent; re-intervention for restenosis complicated by [facility] w/o sx  . CHF (congestive heart failure) ([place]) 06-02-14  . Chronic obstructive pulmonary disease ([place])   . Degenerative joint disease    Total shoulder arthroplasty-right  . Hyperlipidemia   . Hypertension   . LBBB (left bundle branch block)    Normal echo-2011; stress nuclear in 09/2010--septal hypoperfusion representing nontransmural infarction or the effect of left bundle branch block, no ischemia  . Myocardial infarction [facility]) June 02, 2014   Massive Heart Attack  . Peripheral vascular disease ([place])   . 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  . Thrombocytopenia ([place])   . Tobacco abuse    -100 pack years; 1.5 packs per day  . Traumatic  seroma of thigh ([place])    left  . Tubular adenoma of colon     Past Surgical History:  Procedure Laterality Date  . ABDOMINAL AORTAGRAM [name] 05/04/2014   Procedure: ABDOMINAL Maxcine Ham;  Surgeon: [name], MD;  Location: [facility] CATH LAB;  Service: Cardiovascular;  Laterality: [name];  . ABDOMINAL AORTOGRAM [name] 06/25/2017   Procedure: Abdominal Aortogram;  Surgeon: [name], MD;  Location: [place] CV LAB;  Service: Cardiovascular;  Laterality: [name];  . ABDOMINAL AORTOGRAM W/LOWER EXTREMITY [name] 04/22/2018   Procedure: ABDOMINAL AORTOGRAM W/LOWER EXTREMITY;  Surgeon: [name], MD;  Location: [place] CV LAB;  Service: Cardiovascular;  Laterality: [name];  . AGILE CAPSULE [name] 03/18/2014   Procedure: AGILE CAPSULE;  Surgeon: Danie Binder, MD;  [facility];  Service: Endoscopy;  Laterality: [name];  7:30  . APPLICATION OF WOUND VAC Left 09/03/2017   Procedure: APPLICATION OF WOUND VAC;  Surgeon: Conrad Hideout, MD;  Location: [place];  Service: Vascular;  Laterality: Left;  . BACK SURGERY    . BACTERIAL OVERGROWTH TEST [name] 05/24/2015   Procedure: BACTERIAL OVERGROWTH TEST;  Surgeon: Danie Binder, MD;  [facility];  Service: Endoscopy;  Laterality: [name];  0700  . BI-VENTRICULAR IMPLANTABLE CARDIOVERTER DEFIBRILLATOR  (CRT-D)  08/19/2015  . CARPAL TUNNEL RELEASE Left 02/02/2016   Procedure:  LEFT CARPAL TUNNEL RELEASE;  Surgeon: Daryll Brod, MD;  Location: [facility];  Service: Orthopedics;  Laterality: Left;  ANESTHESIA: IV REGIONAL UPPER ARM  . CATARACT EXTRACTION W/PHACO Right 03/08/2015   Procedure: CATARACT EXTRACTION PHACO AND INTRAOCULAR LENS PLACEMENT (IOC);  Surgeon: Rutherford Guys, MD;  [facility];  Service: Ophthalmology;  Laterality: Right;  CDE:9.46  . CATARACT EXTRACTION W/PHACO Left 03/22/2015   Procedure: CATARACT EXTRACTION PHACO AND INTRAOCULAR LENS PLACEMENT (IOC);  Surgeon: Rutherford Guys, MD;  [facility]: [facility];  Service: Ophthalmology;   Laterality: Left;  CDE:5.80  . COLONOSCOPY  08/22/09   Fields-(Tubular Adenoma)3-mm transverse polyp/4-mm polyp otherwise noraml/small internal hemorrhoids  . COLONOSCOPY [name] 04/14/2014   BPZ:WCHEN internal hemorrhids/normal mocsa in the terminal iluem/left colonis redundant  . COLONOSCOPY W/ POLYPECTOMY  2011  . ENDARTERECTOMY FEMORAL Left 08/16/2017   Procedure: ENDARTERECTOMY LEFT PROFUNDA FEMORAL;  Surgeon: [name], MD;  Location: [place];  Service: Vascular;  Laterality: Left;  . EP IMPLANTABLE DEVICE [name] 08/19/2015   Procedure: BiV ICD Insertion CRT-D;  Surgeon: Evans Lance, MD;  Location: [place] CV LAB;  Service: Cardiovascular;  Laterality: [name];  . ESOPHAGOGASTRODUODENOSCOPY [name] 03/05/2014   SLF: 1. Stricture at the gastroesophagael junction 2. Small hiatal hernia 3. Moderate non-erosive gastritis and duodentitis. 4. No surce for Melena identified.   . FEMORAL-POPLITEAL BYPASS GRAFT Left 08/16/2017   Procedure: LEFT  FEMORAL-POPLITEAL ARTERY  BYPASS GRAFT;  Surgeon: [name], MD;  Location: [place];  Service: Vascular;  Laterality: Left;  . GIVENS CAPSULE STUDY [name] 03/30/2014   Procedure: GIVENS CAPSULE STUDY;  Surgeon: Danie Binder, MD;  [facility];  Service: Endoscopy;  Laterality: [name];  7:30  . I&D EXTREMITY Left 09/03/2017   Procedure: IRRIGATION AND DEBRIDEMENT EXTREMITY LEFT THIGH SEROMA;  Surgeon: Conrad [place], MD;  Location[facility] OR;  Service: Vascular;  Laterality: Left;  . IR ANGIO INTRA EXTRACRAN SEL COM CAROTID INNOMINATE BILAT MOD SED  07/16/2017  . IR ANGIO VERTEBRAL SEL SUBCLAVIAN INNOMINATE BILAT MOD SED  07/16/2017  . IR RADIOLOGIST EVAL & MGMT  04/01/2017  . IR RADIOLOGIST EVAL & MGMT  08/02/2017  . JOINT REPLACEMENT Right   . LOWER EXTREMITY ANGIOGRAPHY Bilateral 06/25/2017   Procedure: Lower Extremity Angiography;  Surgeon: [name], MD;  Location: [place] CV LAB;  Service: Cardiovascular;  Laterality: Bilateral;  . LUMBAR FUSION   2010  . PERIPHERAL VASCULAR ATHERECTOMY Right 04/22/2018   Procedure: PERIPHERAL VASCULAR ATHERECTOMY;  Surgeon: [name], MD;  Location: [place] CV LAB;  Service: Cardiovascular;  Laterality: Right;  right superficial femoral  . PERIPHERAL VASCULAR BALLOON ANGIOPLASTY Right 04/22/2018   Procedure: PERIPHERAL VASCULAR BALLOON ANGIOPLASTY;  Surgeon: [name], MD;  Location: [place] CV LAB;  Service: Cardiovascular;  Laterality: Right;  external iliac  . TOTAL SHOULDER ARTHROPLASTY Right 2011   Dr. Tamera Punt  . ULNAR NERVE TRANSPOSITION Left 02/02/2016   Procedure: LEFT IN-SITU DECOMPRESSION ULNAR NERVE ;  Surgeon: Daryll Brod, MD;  Location: [facility];  Service: Orthopedics;  Laterality: Left;  . ULNAR TUNNEL RELEASE Left 02/02/2016   Procedure: LEFT CUBITAL TUNNEL RELEASE;  Surgeon: Daryll Brod, MD;  Location: [facility];  Service: Orthopedics;  Laterality: Left;  [name] VASECTOMY  1971  . VEIN HARVEST Left 08/16/2017   Procedure: USING NON REVERSE LEFT GREATER SAPHENOUS VEIN HARVEST;  Surgeon: [name], MD;  Location: [facility];  Service: Vascular;  Laterality:  Left;    There were no vitals filed for this visit.  Subjective Assessment - 04/30/18 1258    Subjective  Pt states that his neck pain is not as much as it has been. He states that the sometimes when he comes back up from stretching, once in a while it really hurts but otherwise no complaints.     Pertinent History  R TSA    Limitations  Lifting    How long can you sit comfortably?  no issues    How long can you stand comfortably?  no issues    How long can you walk comfortably?  no issues    Patient Stated Goals  not as much to no pain     Currently in Pain?  Yes    Pain Score  2     Pain Location  Neck    Pain Orientation  Right    Pain Descriptors / Indicators  Aching    Pain Type  Chronic pain    Pain Onset  More than a month ago    Pain Frequency  Constant    Aggravating  Factors   rotating R, extending    Pain Relieving Factors  nothing    Effect of Pain on Daily Activities  increases         [facility] PT Assessment - 04/30/18 0001      ROM / Strength   AROM / PROM / Strength  Strength      Strength   Strength Assessment Site  Cervical    Cervical Flexion  5/5    Cervical Extension  5/5    Cervical - Right Side Bend  4+/5 pain    Cervical - Left Side Bend  5/5    Cervical - Right Rotation  5/5    Cervical - Left Rotation  5/5           [facility] Adult PT Treatment/Exercise - 04/30/18 0001      Exercises   Exercises  Neck      Neck Exercises: Seated   Neck Retraction  10 reps;3 secs    Money  10 reps;5 secs    Shoulder Rolls  Backwards;10 reps    Shoulder Rolls Limitations  emphasizing back and down, 5" holds    Other Seated Exercise  scap retraction x10 reps      Manual Therapy   Manual Therapy  Soft tissue mobilization    Manual therapy comments  separate rest of treatment    Soft tissue mobilization  STM R upper trap and levator scap after needling for continued pain control       Neck Exercises: Stretches   Upper Trapezius Stretch  Right;2 reps;30 seconds    Levator Stretch  Right;2 reps;30 seconds       Trigger Point Dry Needling - 04/30/18 1330    Consent Given?  Yes    Education Handout Provided  Yes    Muscles Treated Upper Body  Upper trapezius    Upper Trapezius Response  Twitch reponse elicited;Palpable increased muscle length Right in prone              PT Education - 04/30/18 1258    Education Details  trigger point dry needling risk and benefits; reviewed initial eval and goals    Person(s) Educated  Patient    Methods  Explanation;Demonstration;Handout    Comprehension  Verbalized understanding;Returned demonstration       PT Short Term Goals - 04/25/18 1145  PT SHORT TERM GOAL #1   Title  Pt will be independent with HEP and perform consistently in order to decrease pain and improve function.     Time  2    Period  Weeks    Status  New    Target Date  05/09/18      PT SHORT TERM GOAL #2   Title  Pt will have improved cervical AROM by 5 deg throughout in order to decrease pain and improve overall function.    Time  2    Period  Weeks    Status  New        PT Long Term Goals - 04/25/18 1145      PT LONG TERM GOAL #1   Title  Pt will have improved cervical AROM by 10 deg in cervical extension and bil rotation in order to decrease pain and maximize his ability to drive with greater ease.     Time  4    Period  Weeks    Status  New    Target Date  05/23/18      PT LONG TERM GOAL #2   Title  Pt will have at least 4+/5 cervical MMT in order to decrease pain and maximize his ability to perform household and community responsibilities without pain and with greater ease.    Time  4    Period  Weeks    Status  New      PT LONG TERM GOAL #3   Title  Pt will demo mild soft tissue restrictions of R upper trap and R levator scap in order to decrease pain and improve overall cervical ROM.    Time  4    Period  Weeks    Status  New            Plan - 04/30/18 1341    Clinical Impression Statement  Began session by reviewing initial goals and issuing copy of eval; no f/u questions. Assessed pt's cervical MMT this date and he was 4+ - 5/5 but he had pain with R lateral flexion, again, indicating pathology/dysfunction with upper trap and/or levator scap musculature. Reviewed risks and benefits of trigger point dry needling and pt agreeable to treatment. Mainly focused on R upper trap this date. Good twitch responses elicited throughout and pt reporting recreation of pain during twitches. Followed needling with STM for continued pain control and muscle relaxation. Ended with therex for muscle activation/relaxation. No increased pain reported at EOS. Continue as planned. Progressing as able.    Rehab Potential  Good    PT Frequency  2x / week    PT Duration  4 weeks    PT  Treatment/Interventions  ADLs/Self Care Home Management;Cryotherapy;Electrical Stimulation;Moist Heat;Ultrasound;Functional mobility training;Therapeutic activities;Therapeutic exercise;Neuromuscular re-education;Patient/family education;Manual techniques;Passive range of motion;Dry needling;Energy conservation;Taping    PT Next Visit Plan  demo towel rollin pillow case; perform dry needling for upper trap and levator scap; postural education and strenghtening, stretching, continue cervical retractions    PT Home Exercise Plan  eval: seated upper trap and levator scap stretching on R    Consulted and Agree with Plan of Care  Patient       Patient will benefit from skilled therapeutic intervention in order to improve the following deficits and impairments:  Decreased range of motion, Decreased strength, Hypomobility, Increased fascial restricitons, Increased muscle spasms, Impaired flexibility, Impaired UE functional use, Improper body mechanics, Postural dysfunction, Pain  Visit Diagnosis: Cervicalgia  Abnormal posture  Other  muscle spasm     Problem List Patient Active Problem List   Diagnosis Date Noted  . Complication of surgical procedure 09/03/2017  . History of alcohol abuse 08/19/2017  . PAD (peripheral artery disease) ([place]) 08/16/2017  . ICD (implantable cardioverter-defibrillator), biventricular, in situ 12/01/2015  . Cardiomyopathy, ischemic 08/20/2015  . Normocytic anemia 10/14/2014  . Weight gain 06/29/2014  . Hypokalemia 06/29/2014  . Chronic systolic heart failure ([place]) 06/15/2014  . COPD exacerbation ([place]) 06/15/2014  . Hypernatremia 06/15/2014  . Acute renal failure ([place]) 06/15/2014  . Encephalopathy 06/15/2014  . Loose stools 06/15/2014  . Respiratory failure ([place]) 06/03/2014  . Acute respiratory failure ([place]) 06/03/2014  . Pneumonia, organism unspecified(486) 06/03/2014  . Elevated troponin 06/03/2014  . NSTEMI (non-ST elevated myocardial infarction) ([place])  06/03/2014  . Acute on chronic diastolic heart failure ([place]) 06/03/2014  . Peripheral vascular disease, unspecified ([place]) 04/26/2014  . Melena 03/03/2014  . Cirrhosis, alcoholic ([place]) 56/81/2751  . Alcohol withdrawal delirium ([place]) 10/09/2012  . Colon adenomas 06/18/2012  . Degenerative joint disease   . LBBB (left bundle branch block)   . Tobacco abuse   . Alcohol dependence ([place])   . Thrombocytopenia ([place])   . Hypertensive heart disease   . Anxiety and depression   . Hyperlipidemia 10/03/2010  . CEREBROVASCULAR DISEASE 10/03/2010  . CHRONIC OBSTRUCTIVE PULMONARY DISEASE 10/03/2010  . DEGENERATIVE JOINT DISEASE, SHOULDER 10/02/2007        Geraldine Solar PT, DPT  [facility] [address] [place], [place], [address] Phone: [phone]   Fax:  [phone]  Name: Roy [name] MRN: [id number] Date of Birth: [birth year]

## 2018-04-30 NOTE — Patient Instructions (Signed)

## 2018-05-02 ENCOUNTER — Ambulatory Visit (HOSPITAL_COMMUNITY): Payer: Medicare Other

## 2018-05-02 DIAGNOSIS — R293 Abnormal posture: Secondary | ICD-10-CM

## 2018-05-02 DIAGNOSIS — M542 Cervicalgia: Secondary | ICD-10-CM | POA: Diagnosis not present

## 2018-05-02 DIAGNOSIS — M62838 Other muscle spasm: Secondary | ICD-10-CM

## 2018-05-02 NOTE — Patient Instructions (Signed)
Access Code: [email or web address]  URL: [email or web address]  Date: 05/02/2018  Prepared by: Geraldine Solar   Exercises Seated Cervical Retraction - 10 reps - 3 sets - 5-10 hold - 1x daily - 7x weekly

## 2018-05-02 NOTE — Therapy (Signed)
[place], [place], [address] Phone: [phone]   Fax:  [phone]  Physical Therapy Treatment  Patient Details  Name: Roy [name] MRN: [id number] Date of Birth: [birth year] Referring Provider: Celene Squibb, MD   Encounter Date: 05/02/2018  PT End of Session - 05/02/18 1258    Visit Number  3    Number of Visits  9    Date for PT Re-Evaluation  05/23/18    Authorization Type  Medicare (Secondary: [employer])    Authorization Time Period  04/25/18 to 05/23/18    Authorization - Visit Number  3    Authorization - Number of Visits  10    PT Start Time  2694    PT Stop Time  1339    PT Time Calculation (min)  41 min    Activity Tolerance  Patient tolerated treatment well;No increased pain    Behavior During Therapy  WFL for tasks assessed/performed       Past Medical History:  Diagnosis Date  . AICD (automatic cardioverter/defibrillator) present 08/19/2015   St Jude BiV ICD for primary prevention by Dr. Lovena Le  . Alcohol abuse    6 beers per day; hospital admission in 2009 for withdrawal symptoms  . Alcoholic cirrhosis ([place])   . Anxiety and depression   . Cerebrovascular disease 2009   TIA; 2009- right ICA stent; re-intervention for restenosis complicated by [facility] w/o sx  . CHF (congestive heart failure) ([place]) 06-02-14  . Chronic obstructive pulmonary disease ([place])   . Degenerative joint disease    Total shoulder arthroplasty-right  . Hyperlipidemia   . Hypertension   . LBBB (left bundle branch block)    Normal echo-2011; stress nuclear in 09/2010--septal hypoperfusion representing nontransmural infarction or the effect of left bundle branch block, no ischemia  . Myocardial infarction [facility]) June 02, 2014   Massive Heart Attack  . Peripheral vascular disease ([place])   . 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  . Thrombocytopenia ([place])   . Tobacco abuse    -100 pack years; 1.5 packs per day  . Traumatic  seroma of thigh ([place])    left  . Tubular adenoma of colon     Past Surgical History:  Procedure Laterality Date  . ABDOMINAL AORTAGRAM [name] 05/04/2014   Procedure: ABDOMINAL Maxcine Ham;  Surgeon: [name], MD;  Location: [facility] CATH LAB;  Service: Cardiovascular;  Laterality: [name];  . ABDOMINAL AORTOGRAM [name] 06/25/2017   Procedure: Abdominal Aortogram;  Surgeon: [name], MD;  Location: [place] CV LAB;  Service: Cardiovascular;  Laterality: [name];  . ABDOMINAL AORTOGRAM W/LOWER EXTREMITY [name] 04/22/2018   Procedure: ABDOMINAL AORTOGRAM W/LOWER EXTREMITY;  Surgeon: [name], MD;  Location: [place] CV LAB;  Service: Cardiovascular;  Laterality: [name];  . AGILE CAPSULE [name] 03/18/2014   Procedure: AGILE CAPSULE;  Surgeon: Danie Binder, MD;  [facility];  Service: Endoscopy;  Laterality: [name];  7:30  . APPLICATION OF WOUND VAC Left 09/03/2017   Procedure: APPLICATION OF WOUND VAC;  Surgeon: Conrad Adelanto, MD;  Location: [place];  Service: Vascular;  Laterality: Left;  . BACK SURGERY    . BACTERIAL OVERGROWTH TEST [name] 05/24/2015   Procedure: BACTERIAL OVERGROWTH TEST;  Surgeon: Danie Binder, MD;  [facility];  Service: Endoscopy;  Laterality: [name];  0700  . BI-VENTRICULAR IMPLANTABLE CARDIOVERTER DEFIBRILLATOR  (CRT-D)  08/19/2015  . CARPAL TUNNEL RELEASE Left 02/02/2016   Procedure:  LEFT CARPAL TUNNEL RELEASE;  Surgeon: Daryll Brod, MD;  Location: [place];  Service: Orthopedics;  Laterality: Left;  ANESTHESIA: IV REGIONAL UPPER ARM  . CATARACT EXTRACTION W/PHACO Right 03/08/2015   Procedure: CATARACT EXTRACTION PHACO AND INTRAOCULAR LENS PLACEMENT (IOC);  Surgeon: Rutherford Guys, MD;  Location: [facility];  Service: Ophthalmology;  Laterality: Right;  CDE:9.46  . CATARACT EXTRACTION W/PHACO Left 03/22/2015   Procedure: CATARACT EXTRACTION PHACO AND INTRAOCULAR LENS PLACEMENT (IOC);  Surgeon: Rutherford Guys, MD;  [facility]: [facility];  Service: Ophthalmology;   Laterality: Left;  CDE:5.80  . COLONOSCOPY  08/22/09   Fields-(Tubular Adenoma)3-mm transverse polyp/4-mm polyp otherwise noraml/small internal hemorrhoids  . COLONOSCOPY [name] 04/14/2014   HKV:[id number] internal hemorrhids/normal mocsa in the terminal iluem/left colonis redundant  . COLONOSCOPY W/ POLYPECTOMY  2011  . ENDARTERECTOMY FEMORAL Left 08/16/2017   Procedure: ENDARTERECTOMY LEFT PROFUNDA FEMORAL;  Surgeon: [name], MD;  Location: [place];  Service: Vascular;  Laterality: Left;  . EP IMPLANTABLE DEVICE [name] 08/19/2015   Procedure: BiV ICD Insertion CRT-D;  Surgeon: Evans Lance, MD;  Location: [place] CV LAB;  Service: Cardiovascular;  Laterality: [name];  . ESOPHAGOGASTRODUODENOSCOPY [name] 03/05/2014   SLF: 1. Stricture at the gastroesophagael junction 2. Small hiatal hernia 3. Moderate non-erosive gastritis and duodentitis. 4. No surce for Melena identified.   . FEMORAL-POPLITEAL BYPASS GRAFT Left 08/16/2017   Procedure: LEFT  FEMORAL-POPLITEAL ARTERY  BYPASS GRAFT;  Surgeon: [name], MD;  Location: [place];  Service: Vascular;  Laterality: Left;  . GIVENS CAPSULE STUDY [name] 03/30/2014   Procedure: GIVENS CAPSULE STUDY;  Surgeon: Danie Binder, MD;  [facility];  Service: Endoscopy;  Laterality: [name];  7:30  . I&D EXTREMITY Left 09/03/2017   Procedure: IRRIGATION AND DEBRIDEMENT EXTREMITY LEFT THIGH SEROMA;  Surgeon: Conrad Butte, MD;  Location: [facility] OR;  Service: Vascular;  Laterality: Left;  . IR ANGIO INTRA EXTRACRAN SEL COM CAROTID INNOMINATE BILAT MOD SED  07/16/2017  . IR ANGIO VERTEBRAL SEL SUBCLAVIAN INNOMINATE BILAT MOD SED  07/16/2017  . IR RADIOLOGIST EVAL & MGMT  04/01/2017  . IR RADIOLOGIST EVAL & MGMT  08/02/2017  . JOINT REPLACEMENT Right   . LOWER EXTREMITY ANGIOGRAPHY Bilateral 06/25/2017   Procedure: Lower Extremity Angiography;  Surgeon: [name], MD;  Location: [place] CV LAB;  Service: Cardiovascular;  Laterality: Bilateral;  . LUMBAR FUSION   2010  . PERIPHERAL VASCULAR ATHERECTOMY Right 04/22/2018   Procedure: PERIPHERAL VASCULAR ATHERECTOMY;  Surgeon: [name], MD;  Location: [place] CV LAB;  Service: Cardiovascular;  Laterality: Right;  right superficial femoral  . PERIPHERAL VASCULAR BALLOON ANGIOPLASTY Right 04/22/2018   Procedure: PERIPHERAL VASCULAR BALLOON ANGIOPLASTY;  Surgeon: [name], MD;  Location: [place] CV LAB;  Service: Cardiovascular;  Laterality: Right;  external iliac  . TOTAL SHOULDER ARTHROPLASTY Right 2011   Dr. Tamera Punt  . ULNAR NERVE TRANSPOSITION Left 02/02/2016   Procedure: LEFT IN-SITU DECOMPRESSION ULNAR NERVE ;  Surgeon: Daryll Brod, MD;  Location: [place];  Service: Orthopedics;  Laterality: Left;  . ULNAR TUNNEL RELEASE Left 02/02/2016   Procedure: LEFT CUBITAL TUNNEL RELEASE;  Surgeon: Daryll Brod, MD;  Location: [place];  Service: Orthopedics;  Laterality: Left;  [name] VASECTOMY  1971  . VEIN HARVEST Left 08/16/2017   Procedure: USING NON REVERSE LEFT GREATER SAPHENOUS VEIN HARVEST;  Surgeon: [name], MD;  Location: [place];  Service: Vascular;  Laterality:  Left;    There were no vitals filed for this visit.  Subjective Assessment - 05/02/18 1300    Subjective  Pt reports that he is sore from the needling but his pain is not as bad.     Pertinent History  R TSA    Limitations  Lifting    How long can you sit comfortably?  no issues    How long can you stand comfortably?  no issues    How long can you walk comfortably?  no issues    Patient Stated Goals  not as much to no pain     Currently in Pain?  Yes    Pain Score  1     Pain Location  Neck    Pain Orientation  Right    Pain Descriptors / Indicators  Aching    Pain Type  Chronic pain    Pain Onset  More than a month ago    Pain Frequency  Constant    Aggravating Factors   moving neck to the R    Pain Relieving Factors  nothing    Effect of Pain on Daily Activities  increases              [facility] Adult PT Treatment/Exercise - 05/02/18 0001      Neck Exercises: Machines for Strengthening   UBE (Upper Arm Bike)  x3 mins retro, L2, postural strength      Neck Exercises: Theraband   Scapula Retraction  10 reps;Green;Limitations    Scapula Retraction Limitations  2 sets    Shoulder Extension  10 reps;Green;Limitations    Shoulder Extension Limitations  2 sets    Rows  10 reps;Green;Limitations    Rows Limitations  2 sets, high rows    Horizontal ABduction  --      Neck Exercises: Standing   Other Standing Exercises  Y's on wall with liftoff 2x10      Neck Exercises: Seated   Neck Retraction  15 reps;3 secs;Limitations    Neck Retraction Limitations  cues for proper technique    Shoulder Rolls  Backwards;10 reps    Shoulder Rolls Limitations  emphasizing back and down, 5" holds      Manual Therapy   Manual Therapy  Soft tissue mobilization    Manual therapy comments  separate rest of treatment    Soft tissue mobilization  STM R upper trap and R levator scap to decrease restrictions and pain      Neck Exercises: Stretches   Upper Trapezius Stretch  Right;2 reps;30 seconds    Levator Stretch  Right;2 reps;30 seconds    Other Neck Stretches  R scalene stretch with towel 2x30"             PT Education - 05/02/18 1301    Education Details  towel roll in pillow case; exercise technique, added cervical retractions to HEP    Person(s) Educated  Patient    Methods  Explanation;Demonstration;Handout    Comprehension  Verbalized understanding;Returned demonstration       PT Short Term Goals - 04/25/18 1145      PT SHORT TERM GOAL #1   Title  Pt will be independent with HEP and perform consistently in order to decrease pain and improve function.    Time  2    Period  Weeks    Status  New    Target Date  05/09/18      PT SHORT TERM GOAL #2   Title  Pt will have improved cervical AROM by 5 deg throughout in order to decrease pain and improve  overall function.    Time  2    Period  Weeks    Status  New        PT Long Term Goals - 04/25/18 1145      PT LONG TERM GOAL #1   Title  Pt will have improved cervical AROM by 10 deg in cervical extension and bil rotation in order to decrease pain and maximize his ability to drive with greater ease.     Time  4    Period  Weeks    Status  New    Target Date  05/23/18      PT LONG TERM GOAL #2   Title  Pt will have at least 4+/5 cervical MMT in order to decrease pain and maximize his ability to perform household and community responsibilities without pain and with greater ease.    Time  4    Period  Weeks    Status  New      PT LONG TERM GOAL #3   Title  Pt will demo mild soft tissue restrictions of R upper trap and R levator scap in order to decrease pain and improve overall cervical ROM.    Time  4    Period  Weeks    Status  New            Plan - 05/02/18 1341    Clinical Impression Statement  Continued with established POC focusing on flexibility, posture, and postural strengthening. Added scalene stretch and postural theraband strengthening this date, all with good tolerance, requiring min cues for proper exercise technique. No pain reported during session, just some muscle fatigue. Educated pt on putting towel roll into pillowcase to support his neck during sleep and he was able to demo understanding. Ended with manual to address soft tissue restrictions that remain in his upper trap and levator scap. Pt continues with palpable trigger points throughout; pt stating he thinks the needling helped because he didn't have near as much pain yesterday following it. Will needle pt again next session to address remaining soft tissue restrictions. Continue as planned, progressing as able.    Rehab Potential  Good    PT Frequency  2x / week    PT Duration  4 weeks    PT Treatment/Interventions  ADLs/Self Care Home Management;Cryotherapy;Electrical Stimulation;Moist  Heat;Ultrasound;Functional mobility training;Therapeutic activities;Therapeutic exercise;Neuromuscular re-education;Patient/family education;Manual techniques;Passive range of motion;Dry needling;Energy conservation;Taping    PT Next Visit Plan  perform dry needling for upper trap and levator scap; postural education and strenghtening and progress as able; add thoracic mobility work; cervical joint mobs PRN    PT Home Exercise Plan  eval: seated upper trap and levator scap stretching on R; 6/7: cervical retractions    Consulted and Agree with Plan of Care  Patient       Patient will benefit from skilled therapeutic intervention in order to improve the following deficits and impairments:  Decreased range of motion, Decreased strength, Hypomobility, Increased fascial restricitons, Increased muscle spasms, Impaired flexibility, Impaired UE functional use, Improper body mechanics, Postural dysfunction, Pain  Visit Diagnosis: Cervicalgia  Abnormal posture  Other muscle spasm     Problem List Patient Active Problem List   Diagnosis Date Noted  . Complication of surgical procedure 09/03/2017  . History of alcohol abuse 08/19/2017  . PAD (peripheral artery disease) ([place]) 08/16/2017  . ICD (implantable cardioverter-defibrillator), biventricular, in  situ 12/01/2015  . Cardiomyopathy, ischemic 08/20/2015  . Normocytic anemia 10/14/2014  . Weight gain 06/29/2014  . Hypokalemia 06/29/2014  . Chronic systolic heart failure ([place]) 06/15/2014  . COPD exacerbation ([place]) 06/15/2014  . Hypernatremia 06/15/2014  . Acute renal failure ([place]) 06/15/2014  . Encephalopathy 06/15/2014  . Loose stools 06/15/2014  . Respiratory failure ([place]) 06/03/2014  . Acute respiratory failure ([place]) 06/03/2014  . Pneumonia, organism unspecified(486) 06/03/2014  . Elevated troponin 06/03/2014  . NSTEMI (non-ST elevated myocardial infarction) ([place]) 06/03/2014  . Acute on chronic diastolic heart failure ([place]) 06/03/2014   . Peripheral vascular disease, unspecified ([place]) 04/26/2014  . Melena 03/03/2014  . Cirrhosis, alcoholic ([place]) 15/72/6203  . Alcohol withdrawal delirium ([place]) 10/09/2012  . Colon adenomas 06/18/2012  . Degenerative joint disease   . LBBB (left bundle branch block)   . Tobacco abuse   . Alcohol dependence ([place])   . Thrombocytopenia ([place])   . Hypertensive heart disease   . Anxiety and depression   . Hyperlipidemia 10/03/2010  . CEREBROVASCULAR DISEASE 10/03/2010  . CHRONIC OBSTRUCTIVE PULMONARY DISEASE 10/03/2010  . DEGENERATIVE JOINT DISEASE, SHOULDER 10/02/2007       Geraldine Solar PT, DPT  [place] [address] [place], [place], [address] Phone: [phone]   Fax:  [phone]  Name: Roy [name] MRN: [id number] Date of Birth: [birth year]

## 2018-05-07 ENCOUNTER — Ambulatory Visit (HOSPITAL_COMMUNITY): Payer: Medicare Other

## 2018-05-07 DIAGNOSIS — M542 Cervicalgia: Secondary | ICD-10-CM | POA: Diagnosis not present

## 2018-05-07 DIAGNOSIS — R293 Abnormal posture: Secondary | ICD-10-CM

## 2018-05-07 DIAGNOSIS — M62838 Other muscle spasm: Secondary | ICD-10-CM

## 2018-05-07 NOTE — Therapy (Signed)
[place], [place], [address] Phone: [phone]   Fax:  [phone]  Physical Therapy Treatment  Patient Details  Name: Roy [name] MRN: [id number] Date of Birth: August 18, 1946 Referring Provider: Celene Squibb, MD   Encounter Date: 05/07/2018  PT End of Session - 05/07/18 0901    Visit Number  4    Number of Visits  9    Date for PT Re-Evaluation  05/23/18    Authorization Type  Medicare (Secondary: [employer])    Authorization Time Period  04/25/18 to 05/23/18    Authorization - Visit Number  4    Authorization - Number of Visits  10    PT Start Time  0901    PT Stop Time  0941    PT Time Calculation (min)  40 min    Activity Tolerance  Patient tolerated treatment well;No increased pain    Behavior During Therapy  WFL for tasks assessed/performed       Past Medical History:  Diagnosis Date  . AICD (automatic cardioverter/defibrillator) present 08/19/2015   St Jude BiV ICD for primary prevention by Dr. Lovena Le  . Alcohol abuse    6 beers per day; hospital admission in 2009 for withdrawal symptoms  . Alcoholic cirrhosis ([place])   . Anxiety and depression   . Cerebrovascular disease 2009   TIA; 2009- right ICA stent; re-intervention for restenosis complicated by [facility] w/o sx  . CHF (congestive heart failure) ([place]) 06-02-14  . Chronic obstructive pulmonary disease ([place])   . Degenerative joint disease    Total shoulder arthroplasty-right  . Hyperlipidemia   . Hypertension   . LBBB (left bundle branch block)    Normal echo-2011; stress nuclear in 09/2010--septal hypoperfusion representing nontransmural infarction or the effect of left bundle branch block, no ischemia  . Myocardial infarction [facility]) June 02, 2014   Massive Heart Attack  . Peripheral vascular disease ([place])   . 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  . Thrombocytopenia ([place])   . Tobacco abuse    -100 pack years; 1.5 packs per day  .  Traumatic seroma of thigh ([place])    left  . Tubular adenoma of colon     Past Surgical History:  Procedure Laterality Date  . ABDOMINAL AORTAGRAM [name] 05/04/2014   Procedure: ABDOMINAL Maxcine Ham;  Surgeon: [name], MD;  Location: [facility] CATH LAB;  Service: Cardiovascular;  Laterality: [name];  . ABDOMINAL AORTOGRAM [name] 06/25/2017   Procedure: Abdominal Aortogram;  Surgeon: [name], MD;  Location: [place] CV LAB;  Service: Cardiovascular;  Laterality: [name];  . ABDOMINAL AORTOGRAM W/LOWER EXTREMITY [name] 04/22/2018   Procedure: ABDOMINAL AORTOGRAM W/LOWER EXTREMITY;  Surgeon: [name], MD;  Location: [place] CV LAB;  Service: Cardiovascular;  Laterality: [name];  . AGILE CAPSULE [name] 03/18/2014   Procedure: AGILE CAPSULE;  Surgeon: Danie Binder, MD;  [facility];  Service: Endoscopy;  Laterality: [name];  7:30  . APPLICATION OF WOUND VAC Left 09/03/2017   Procedure: APPLICATION OF WOUND VAC;  Surgeon: Conrad Wilton, MD;  Location: [place];  Service: Vascular;  Laterality: Left;  . BACK SURGERY    . BACTERIAL OVERGROWTH TEST [name] 05/24/2015   Procedure: BACTERIAL OVERGROWTH TEST;  Surgeon: Danie Binder, MD;  [facility];  Service: Endoscopy;  Laterality: [name];  0700  . BI-VENTRICULAR IMPLANTABLE CARDIOVERTER DEFIBRILLATOR  (CRT-D)  08/19/2015  . CARPAL TUNNEL RELEASE Left 02/02/2016   Procedure:  LEFT CARPAL TUNNEL RELEASE;  Surgeon: Daryll Brod, MD;  Location: [place];  Service: Orthopedics;  Laterality: Left;  ANESTHESIA: IV REGIONAL UPPER ARM  . CATARACT EXTRACTION W/PHACO Right 03/08/2015   Procedure: CATARACT EXTRACTION PHACO AND INTRAOCULAR LENS PLACEMENT (IOC);  Surgeon: Rutherford Guys, MD;  Location: [facility];  Service: Ophthalmology;  Laterality: Right;  CDE:9.46  . CATARACT EXTRACTION W/PHACO Left 03/22/2015   Procedure: CATARACT EXTRACTION PHACO AND INTRAOCULAR LENS PLACEMENT (IOC);  Surgeon: Rutherford Guys, MD;  [facility]: [facility];  Service:  Ophthalmology;  Laterality: Left;  CDE:5.80  . COLONOSCOPY  08/22/09   Fields-(Tubular Adenoma)3-mm transverse polyp/4-mm polyp otherwise noraml/small internal hemorrhoids  . COLONOSCOPY [name] 04/14/2014   BPZ:WCHEN internal hemorrhids/normal mocsa in the terminal iluem/left colonis redundant  . COLONOSCOPY W/ POLYPECTOMY  2011  . ENDARTERECTOMY FEMORAL Left 08/16/2017   Procedure: ENDARTERECTOMY LEFT PROFUNDA FEMORAL;  Surgeon: [name], MD;  Location: [place];  Service: Vascular;  Laterality: Left;  . EP IMPLANTABLE DEVICE [name] 08/19/2015   Procedure: BiV ICD Insertion CRT-D;  Surgeon: Evans Lance, MD;  Location: [place] CV LAB;  Service: Cardiovascular;  Laterality: [name];  . ESOPHAGOGASTRODUODENOSCOPY [name] 03/05/2014   SLF: 1. Stricture at the gastroesophagael junction 2. Small hiatal hernia 3. Moderate non-erosive gastritis and duodentitis. 4. No surce for Melena identified.   . FEMORAL-POPLITEAL BYPASS GRAFT Left 08/16/2017   Procedure: LEFT  FEMORAL-POPLITEAL ARTERY  BYPASS GRAFT;  Surgeon: [name], MD;  Location: [place];  Service: Vascular;  Laterality: Left;  . GIVENS CAPSULE STUDY [name] 03/30/2014   Procedure: GIVENS CAPSULE STUDY;  Surgeon: Danie Binder, MD;  [facility];  Service: Endoscopy;  Laterality: [name];  7:30  . I&D EXTREMITY Left 09/03/2017   Procedure: IRRIGATION AND DEBRIDEMENT EXTREMITY LEFT THIGH SEROMA;  Surgeon: Conrad Wickliffe, MD;  Location: [facility] OR;  Service: Vascular;  Laterality: Left;  . IR ANGIO INTRA EXTRACRAN SEL COM CAROTID INNOMINATE BILAT MOD SED  07/16/2017  . IR ANGIO VERTEBRAL SEL SUBCLAVIAN INNOMINATE BILAT MOD SED  07/16/2017  . IR RADIOLOGIST EVAL & MGMT  04/01/2017  . IR RADIOLOGIST EVAL & MGMT  08/02/2017  . JOINT REPLACEMENT Right   . LOWER EXTREMITY ANGIOGRAPHY Bilateral 06/25/2017   Procedure: Lower Extremity Angiography;  Surgeon: [name], MD;  Location: [place] CV LAB;  Service: Cardiovascular;  Laterality: Bilateral;  .  LUMBAR FUSION  2010  . PERIPHERAL VASCULAR ATHERECTOMY Right 04/22/2018   Procedure: PERIPHERAL VASCULAR ATHERECTOMY;  Surgeon: [name], MD;  Location: [place] CV LAB;  Service: Cardiovascular;  Laterality: Right;  right superficial femoral  . PERIPHERAL VASCULAR BALLOON ANGIOPLASTY Right 04/22/2018   Procedure: PERIPHERAL VASCULAR BALLOON ANGIOPLASTY;  Surgeon: [name], MD;  Location: [place] CV LAB;  Service: Cardiovascular;  Laterality: Right;  external iliac  . TOTAL SHOULDER ARTHROPLASTY Right 2011   Dr. Tamera Punt  . ULNAR NERVE TRANSPOSITION Left 02/02/2016   Procedure: LEFT IN-SITU DECOMPRESSION ULNAR NERVE ;  Surgeon: Daryll Brod, MD;  Location: [place];  Service: Orthopedics;  Laterality: Left;  . ULNAR TUNNEL RELEASE Left 02/02/2016   Procedure: LEFT CUBITAL TUNNEL RELEASE;  Surgeon: Daryll Brod, MD;  Location: [place];  Service: Orthopedics;  Laterality: Left;  [name] VASECTOMY  1971  . VEIN HARVEST Left 08/16/2017   Procedure: USING NON REVERSE LEFT GREATER SAPHENOUS VEIN HARVEST;  Surgeon: [name], MD;  Location: [place];  Service: Vascular;  Laterality:  Left;    There were no vitals filed for this visit.  Subjective Assessment - 05/07/18 0902    Subjective  Pt states that he stressed his lower back moving a press Friday so he is moving slow today. He states that his neck pain has been good until he came in this morning. He tried the towel roll in his pillow case; the first night was tough, the second night was great, but the third night he used a larger towel and states that he couldn't sleep. He will go back to the small towel since it helped.     Pertinent History  R TSA    Limitations  Lifting    How long can you sit comfortably?  no issues    How long can you stand comfortably?  no issues    How long can you walk comfortably?  no issues    Patient Stated Goals  not as much to no pain     Currently in Pain?  Yes    Pain  Score  2     Pain Location  Neck    Pain Orientation  Right    Pain Descriptors / Indicators  Aching    Pain Type  Chronic pain    Pain Onset  More than a month ago    Pain Frequency  Constant    Aggravating Factors   moving neck to the R    Pain Relieving Factors  nothing    Effect of Pain on Daily Activities  increases              [facility] Adult PT Treatment/Exercise - 05/07/18 0001      Manual Therapy   Manual Therapy  Soft tissue mobilization    Manual therapy comments  separate rest of treatment    Soft tissue mobilization  STM R upper trap and R levator scap to decrease restrictions and pain      Neck Exercises: Stretches   Upper Trapezius Stretch  Right;2 reps;30 seconds    Levator Stretch  Right;2 reps;30 seconds    Other Neck Stretches  R pec stretch in doorway (RUE only, hand low, rotating trunk/chest to the L) 1x30"       Trigger Point Dry Needling - 05/07/18 0905    Consent Given?  Yes    Education Handout Provided  No    Muscles Treated Upper Body  Upper trapezius    Upper Trapezius Response  Twitch reponse elicited;Palpable increased muscle length R in prone             PT Education - 05/07/18 0901    Education Details  expect soreness from dry needling, continue HEP    Person(s) Educated  Patient    Methods  Explanation;Demonstration    Comprehension  Verbalized understanding;Returned demonstration       PT Short Term Goals - 04/25/18 1145      PT SHORT TERM GOAL #1   Title  Pt will be independent with HEP and perform consistently in order to decrease pain and improve function.    Time  2    Period  Weeks    Status  New    Target Date  05/09/18      PT SHORT TERM GOAL #2   Title  Pt will have improved cervical AROM by 5 deg throughout in order to decrease pain and improve overall function.    Time  2    Period  Weeks    Status  New        PT Long Term Goals - 04/25/18 1145      PT LONG TERM GOAL #1   Title  Pt will have improved  cervical AROM by 10 deg in cervical extension and bil rotation in order to decrease pain and maximize his ability to drive with greater ease.     Time  4    Period  Weeks    Status  New    Target Date  05/23/18      PT LONG TERM GOAL #2   Title  Pt will have at least 4+/5 cervical MMT in order to decrease pain and maximize his ability to perform household and community responsibilities without pain and with greater ease.    Time  4    Period  Weeks    Status  New      PT LONG TERM GOAL #3   Title  Pt will demo mild soft tissue restrictions of R upper trap and R levator scap in order to decrease pain and improve overall cervical ROM.    Time  4    Period  Weeks    Status  New            Plan - 05/07/18 8182    Clinical Impression Statement  Resumed dry needling this date since pt still c/o R sided neck pain. PT able to get great twitch responses out of R upper trap and pt reporting recreation of pain. Pt noted to have increased anterior tilt of scapula noted while lying in prone so PT had to prop his shoulder up with pillow to get in proper GH/scapulothoracic position. Followed needling with STM for further muscle relaxation and pain relief. Ended session with upper trap, levator scap, and pec minor stretching. Difficulty finding pec stretch position that didn't aggravate pt's R [place] joint but finally able to find one that stretched his R pec minor without causing R GH pain. No pain reported at EOS.    Rehab Potential  Good    PT Frequency  2x / week    PT Duration  4 weeks    PT Treatment/Interventions  ADLs/Self Care Home Management;Cryotherapy;Electrical Stimulation;Moist Heat;Ultrasound;Functional mobility training;Therapeutic activities;Therapeutic exercise;Neuromuscular re-education;Patient/family education;Manual techniques;Passive range of motion;Dry needling;Energy conservation;Taping    PT Next Visit Plan  perform dry needling for upper trap and levator scap; postural education  and strenghtening and progress as able; add thoracic mobility work; cervical joint mobs PRN; add pec minor release and continue pec minor stretching    PT Home Exercise Plan  eval: seated upper trap and levator scap stretching on R; 6/7: cervical retractions    Consulted and Agree with Plan of Care  Patient       Patient will benefit from skilled therapeutic intervention in order to improve the following deficits and impairments:  Decreased range of motion, Decreased strength, Hypomobility, Increased fascial restricitons, Increased muscle spasms, Impaired flexibility, Impaired UE functional use, Improper body mechanics, Postural dysfunction, Pain  Visit Diagnosis: Cervicalgia  Abnormal posture  Other muscle spasm     Problem List Patient Active Problem List   Diagnosis Date Noted  . Complication of surgical procedure 09/03/2017  . History of alcohol abuse 08/19/2017  . PAD (peripheral artery disease) ([place]) 08/16/2017  . ICD (implantable cardioverter-defibrillator), biventricular, in situ 12/01/2015  . Cardiomyopathy, ischemic 08/20/2015  . Normocytic anemia 10/14/2014  . Weight gain 06/29/2014  . Hypokalemia 06/29/2014  . Chronic systolic heart failure ([place]) 06/15/2014  .  COPD exacerbation ([place]) 06/15/2014  . Hypernatremia 06/15/2014  . Acute renal failure ([place]) 06/15/2014  . Encephalopathy 06/15/2014  . Loose stools 06/15/2014  . Respiratory failure ([place]) 06/03/2014  . Acute respiratory failure ([place]) 06/03/2014  . Pneumonia, organism unspecified(486) 06/03/2014  . Elevated troponin 06/03/2014  . NSTEMI (non-ST elevated myocardial infarction) ([place]) 06/03/2014  . Acute on chronic diastolic heart failure ([place]) 06/03/2014  . Peripheral vascular disease, unspecified ([place]) 04/26/2014  . Melena 03/03/2014  . Cirrhosis, alcoholic ([place]) 47/20/7218  . Alcohol withdrawal delirium ([place]) 10/09/2012  . Colon adenomas 06/18/2012  . Degenerative joint disease   . LBBB (left bundle  branch block)   . Tobacco abuse   . Alcohol dependence ([place])   . Thrombocytopenia ([place])   . Hypertensive heart disease   . Anxiety and depression   . Hyperlipidemia 10/03/2010  . CEREBROVASCULAR DISEASE 10/03/2010  . CHRONIC OBSTRUCTIVE PULMONARY DISEASE 10/03/2010  . DEGENERATIVE JOINT DISEASE, SHOULDER 10/02/2007       Geraldine Solar PT, DPT  [place] [address] [place], [place], [address] Phone: [phone]   Fax:  [phone]  Name: Roy [name] MRN: [id number] Date of Birth: 09/19/1946

## 2018-05-08 ENCOUNTER — Other Ambulatory Visit: Payer: Self-pay

## 2018-05-08 DIAGNOSIS — I739 Peripheral vascular disease, unspecified: Secondary | ICD-10-CM

## 2018-05-09 ENCOUNTER — Encounter (HOSPITAL_COMMUNITY): Payer: Self-pay

## 2018-05-09 ENCOUNTER — Ambulatory Visit (HOSPITAL_COMMUNITY): Payer: Medicare Other

## 2018-05-09 DIAGNOSIS — M62838 Other muscle spasm: Secondary | ICD-10-CM

## 2018-05-09 DIAGNOSIS — R293 Abnormal posture: Secondary | ICD-10-CM

## 2018-05-09 DIAGNOSIS — M542 Cervicalgia: Secondary | ICD-10-CM

## 2018-05-09 NOTE — Therapy (Signed)
[place], [place], [address] Phone: [phone]   Fax:  [phone]  Physical Therapy Treatment  Patient Details  Name: Roy [name] MRN: [id number] Date of Birth: [birth year] Referring Provider: Celene Squibb, MD   Encounter Date: 05/09/2018  PT End of Session - 05/09/18 0854    Visit Number  5    Number of Visits  9    Date for PT Re-Evaluation  05/23/18    Authorization Type  Medicare (Secondary: T[employer]are)    Authorization Time Period  04/25/18 to 05/23/18    Authorization - Visit Number  5    Authorization - Number of Visits  10    PT Start Time  9767    PT Stop Time  0937    PT Time Calculation (min)  42 min    Activity Tolerance  Patient tolerated treatment well;No increased pain    Behavior During Therapy  WFL for tasks assessed/performed       Past Medical History:  Diagnosis Date  . AICD (automatic cardioverter/defibrillator) present 08/19/2015   St Jude BiV ICD for primary prevention by Dr. Lovena Le  . Alcohol abuse    6 beers per day; hospital admission in 2009 for withdrawal symptoms  . Alcoholic cirrhosis ([place])   . Anxiety and depression   . Cerebrovascular disease 2009   TIA; 2009- right ICA stent; re-intervention for restenosis complicated by [facility] w/o sx  . CHF (congestive heart failure) ([place]) 06-02-14  . Chronic obstructive pulmonary disease ([place])   . Degenerative joint disease    Total shoulder arthroplasty-right  . Hyperlipidemia   . Hypertension   . LBBB (left bundle branch block)    Normal echo-2011; stress nuclear in 09/2010--septal hypoperfusion representing nontransmural infarction or the effect of left bundle branch block, no ischemia  . Myocardial infarction [facility]) June 02, 2014   Massive Heart Attack  . Peripheral vascular disease ([place])   . 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  . Thrombocytopenia ([place])   . Tobacco abuse    -100 pack years; 1.5 packs per day  .  Traumatic seroma of thigh ([place])    left  . Tubular adenoma of colon     Past Surgical History:  Procedure Laterality Date  . ABDOMINAL AORTAGRAM [name] 05/04/2014   Procedure: ABDOMINAL Maxcine Ham;  Surgeon: [name], MD;  Location: [facility] CATH LAB;  Service: Cardiovascular;  Laterality: [name];  . ABDOMINAL AORTOGRAM [name] 06/25/2017   Procedure: Abdominal Aortogram;  Surgeon: [name], MD;  Location: [place] CV LAB;  Service: Cardiovascular;  Laterality: [name];  . ABDOMINAL AORTOGRAM W/LOWER EXTREMITY [name] 04/22/2018   Procedure: ABDOMINAL AORTOGRAM W/LOWER EXTREMITY;  Surgeon: [name], MD;  Location: [place] CV LAB;  Service: Cardiovascular;  Laterality: [name];  . AGILE CAPSULE [name] 03/18/2014   Procedure: AGILE CAPSULE;  Surgeon: Danie Binder, MD;  [facility];  Service: Endoscopy;  Laterality: [name];  7:30  . APPLICATION OF WOUND VAC Left 09/03/2017   Procedure: APPLICATION OF WOUND VAC;  Surgeon: Conrad Scenic, MD;  Location: [place];  Service: Vascular;  Laterality: Left;  . BACK SURGERY    . BACTERIAL OVERGROWTH TEST [name] 05/24/2015   Procedure: BACTERIAL OVERGROWTH TEST;  Surgeon: Danie Binder, MD;  [facility];  Service: Endoscopy;  Laterality: [name];  0700  . BI-VENTRICULAR IMPLANTABLE CARDIOVERTER DEFIBRILLATOR  (CRT-D)  08/19/2015  . CARPAL TUNNEL RELEASE Left 02/02/2016   Procedure:  LEFT CARPAL TUNNEL RELEASE;  Surgeon: Daryll Brod, MD;  Location: [place];  Service: Orthopedics;  Laterality: Left;  ANESTHESIA: IV REGIONAL UPPER ARM  . CATARACT EXTRACTION W/PHACO Right 03/08/2015   Procedure: CATARACT EXTRACTION PHACO AND INTRAOCULAR LENS PLACEMENT (IOC);  Surgeon: Rutherford Guys, MD;  Location: [facility];  Service: Ophthalmology;  Laterality: Right;  CDE:9.46  . CATARACT EXTRACTION W/PHACO Left 03/22/2015   Procedure: CATARACT EXTRACTION PHACO AND INTRAOCULAR LENS PLACEMENT (IOC);  Surgeon: Rutherford Guys, MD;  Location: [facility];  Service:  Ophthalmology;  Laterality: Left;  CDE:5.80  . COLONOSCOPY  08/22/09   Fields-(Tubular Adenoma)3-mm transverse polyp/4-mm polyp otherwise noraml/small internal hemorrhoids  . COLONOSCOPY [name] 04/14/2014   BPZ:WCHEN internal hemorrhids/normal mocsa in the terminal iluem/left colonis redundant  . COLONOSCOPY W/ POLYPECTOMY  2011  . ENDARTERECTOMY FEMORAL Left 08/16/2017   Procedure: ENDARTERECTOMY LEFT PROFUNDA FEMORAL;  Surgeon: [name], MD;  Location: [place];  Service: Vascular;  Laterality: Left;  . EP IMPLANTABLE DEVICE [name] 08/19/2015   Procedure: BiV ICD Insertion CRT-D;  Surgeon: Evans Lance, MD;  Location: [place] CV LAB;  Service: Cardiovascular;  Laterality: [name];  . ESOPHAGOGASTRODUODENOSCOPY [name] 03/05/2014   SLF: 1. Stricture at the gastroesophagael junction 2. Small hiatal hernia 3. Moderate non-erosive gastritis and duodentitis. 4. No surce for Melena identified.   . FEMORAL-POPLITEAL BYPASS GRAFT Left 08/16/2017   Procedure: LEFT  FEMORAL-POPLITEAL ARTERY  BYPASS GRAFT;  Surgeon: [name], MD;  Location: [place];  Service: Vascular;  Laterality: Left;  . GIVENS CAPSULE STUDY [name] 03/30/2014   Procedure: GIVENS CAPSULE STUDY;  Surgeon: Danie Binder, MD;  [facility];  Service: Endoscopy;  Laterality: [name];  7:30  . I&D EXTREMITY Left 09/03/2017   Procedure: IRRIGATION AND DEBRIDEMENT EXTREMITY LEFT THIGH SEROMA;  Surgeon: Conrad Wickliffe, MD;  Location: [facility] OR;  Service: Vascular;  Laterality: Left;  . IR ANGIO INTRA EXTRACRAN SEL COM CAROTID INNOMINATE BILAT MOD SED  07/16/2017  . IR ANGIO VERTEBRAL SEL SUBCLAVIAN INNOMINATE BILAT MOD SED  07/16/2017  . IR RADIOLOGIST EVAL & MGMT  04/01/2017  . IR RADIOLOGIST EVAL & MGMT  08/02/2017  . JOINT REPLACEMENT Right   . LOWER EXTREMITY ANGIOGRAPHY Bilateral 06/25/2017   Procedure: Lower Extremity Angiography;  Surgeon: [name], MD;  Location: [place] CV LAB;  Service: Cardiovascular;  Laterality: Bilateral;  .  LUMBAR FUSION  2010  . PERIPHERAL VASCULAR ATHERECTOMY Right 04/22/2018   Procedure: PERIPHERAL VASCULAR ATHERECTOMY;  Surgeon: [name], MD;  Location: [place] CV LAB;  Service: Cardiovascular;  Laterality: Right;  right superficial femoral  . PERIPHERAL VASCULAR BALLOON ANGIOPLASTY Right 04/22/2018   Procedure: PERIPHERAL VASCULAR BALLOON ANGIOPLASTY;  Surgeon: [name], MD;  Location: [place] CV LAB;  Service: Cardiovascular;  Laterality: Right;  external iliac  . TOTAL SHOULDER ARTHROPLASTY Right 2011   Dr. Tamera Punt  . ULNAR NERVE TRANSPOSITION Left 02/02/2016   Procedure: LEFT IN-SITU DECOMPRESSION ULNAR NERVE ;  Surgeon: Daryll Brod, MD;  Location: [place];  Service: Orthopedics;  Laterality: Left;  . ULNAR TUNNEL RELEASE Left 02/02/2016   Procedure: LEFT CUBITAL TUNNEL RELEASE;  Surgeon: Daryll Brod, MD;  Location: [place];  Service: Orthopedics;  Laterality: Left;  [name] VASECTOMY  1971  . VEIN HARVEST Left 08/16/2017   Procedure: USING NON REVERSE LEFT GREATER SAPHENOUS VEIN HARVEST;  Surgeon: [name], MD;  Location: [place];  Service: Vascular;  Laterality:  Left;    There were no vitals filed for this visit.  Subjective Assessment - 05/09/18 0855    Subjective  Pt reports that he had 0 pain in his neck yesterday and today, just a slight ache. He rates it as a 1/10. He reports he is making progress.    Pertinent History  R TSA    Limitations  Lifting    How long can you sit comfortably?  no issues    How long can you stand comfortably?  no issues    How long can you walk comfortably?  no issues    Patient Stated Goals  not as much to no pain     Currently in Pain?  Yes    Pain Score  1     Pain Location  Neck    Pain Orientation  Right    Pain Descriptors / Indicators  Aching    Pain Type  Chronic pain    Pain Onset  More than a month ago    Pain Frequency  Intermittent    Aggravating Factors   moving neck to the R     Pain Relieving Factors  nothing    Effect of Pain on Daily Activities  increases          [facility] Adult PT Treatment/Exercise - 05/09/18 0001      Neck Exercises: Machines for Strengthening   UBE (Upper Arm Bike)  x3 mins retro, L3, postural strength      Neck Exercises: Theraband   Scapula Retraction  15 reps;Green    Scapula Retraction Limitations  2 sets    Shoulder Extension  15 reps;Green    Shoulder Extension Limitations  2 sets    Rows  15 reps;Green    Rows Limitations  2 sets, high rows      Neck Exercises: Standing   Wall Push Ups  10 reps;Limitations    Wall Push Ups Limitations  2 sets    Wall Wash  2x10 reps RUE only    Thumb Tacks  shoulder taps in modified plantigrade x10    Other Standing Exercises  Y's on wall with liftoff 2x15    Other Standing Exercises  horiz abd with GTB x10reps, x9 reps (limited due to fatigue)      Neck Exercises: Seated   Other Seated Exercise  3D thoracic excursions with UE x5 reps each      Manual Therapy   Manual Therapy  Soft tissue mobilization;Joint mobilization    Manual therapy comments  separate rest of treatment    Joint Mobilization  thoracic joint mobs, T1-10, Grade III-IV, x15-20" bouts each segment    Soft tissue mobilization  STM R upper trap and levator scap for pain control and decreasing restrictions      Neck Exercises: Stretches   Other Neck Stretches  R pec stretch in doorway (RUE only, hand low, rotating trunk/chest to the L) 3x30"           PT Education - 05/09/18 0855    Education Details  exercise technique, conitnue HEP; updated HEP    Person(s) Educated  Patient    Methods  Explanation;Demonstration;Handout    Comprehension  Verbalized understanding;Returned demonstration         PT Short Term Goals - 04/25/18 1145      PT SHORT TERM GOAL #1   Title  Pt will be independent with HEP and perform consistently in order to decrease pain and improve function.  Time  2    Period  Weeks    Status   New    Target Date  05/09/18      PT SHORT TERM GOAL #2   Title  Pt will have improved cervical AROM by 5 deg throughout in order to decrease pain and improve overall function.    Time  2    Period  Weeks    Status  New        PT Long Term Goals - 04/25/18 1145      PT LONG TERM GOAL #1   Title  Pt will have improved cervical AROM by 10 deg in cervical extension and bil rotation in order to decrease pain and maximize his ability to drive with greater ease.     Time  4    Period  Weeks    Status  New    Target Date  05/23/18      PT LONG TERM GOAL #2   Title  Pt will have at least 4+/5 cervical MMT in order to decrease pain and maximize his ability to perform household and community responsibilities without pain and with greater ease.    Time  4    Period  Weeks    Status  New      PT LONG TERM GOAL #3   Title  Pt will demo mild soft tissue restrictions of R upper trap and R levator scap in order to decrease pain and improve overall cervical ROM.    Time  4    Period  Weeks    Status  New            Plan - 05/09/18 0940    Clinical Impression Statement  Pt presenting to therapy today stating that he did not have any neck pain following the needling and only has a minor ache in his neck today, a significant improvement since starting PT. Today's session focused on postural strengthening and addressing remaining soft tissue restrictions in upper trap and levator scap. Pt challenged with progressed postural strengthening this date; no pain reported, just increased fatigue. Began thoracic joint mobs to assist with overall improved mobility. Pt's upper trap restrictions are decreasing and he is reporting less pain to palpation; he was noted to still have palpable knot/trigger point at lateral upper trap but overall, he has improved greatly. Continue as planned, progressing as able.    Rehab Potential  Good    PT Frequency  2x / week    PT Duration  4 weeks    PT  Treatment/Interventions  ADLs/Self Care Home Management;Cryotherapy;Electrical Stimulation;Moist Heat;Ultrasound;Functional mobility training;Therapeutic activities;Therapeutic exercise;Neuromuscular re-education;Patient/family education;Manual techniques;Passive range of motion;Dry needling;Energy conservation;Taping    PT Next Visit Plan  perform dry needling for upper trap and levator scap; postural education and strenghtening and progress as able; continue thoracic mobility work; cervical joint mobs PRN; add pec minor release and continue pec minor stretching    PT Home Exercise Plan  eval: seated upper trap and levator scap stretching on R; 6/7: cervical retractions; 6/14: scap retraction, GH extension, high rows GTB    Consulted and Agree with Plan of Care  Patient       Patient will benefit from skilled therapeutic intervention in order to improve the following deficits and impairments:  Decreased range of motion, Decreased strength, Hypomobility, Increased fascial restricitons, Increased muscle spasms, Impaired flexibility, Impaired UE functional use, Improper body mechanics, Postural dysfunction, Pain  Visit Diagnosis: Cervicalgia  Abnormal posture  Other  muscle spasm     Problem List Patient Active Problem List   Diagnosis Date Noted  . Complication of surgical procedure 09/03/2017  . History of alcohol abuse 08/19/2017  . PAD (peripheral artery disease) ([place]) 08/16/2017  . ICD (implantable cardioverter-defibrillator), biventricular, in situ 12/01/2015  . Cardiomyopathy, ischemic 08/20/2015  . Normocytic anemia 10/14/2014  . Weight gain 06/29/2014  . Hypokalemia 06/29/2014  . Chronic systolic heart failure ([place]) 06/15/2014  . COPD exacerbation ([place]) 06/15/2014  . Hypernatremia 06/15/2014  . Acute renal failure ([place]) 06/15/2014  . Encephalopathy 06/15/2014  . Loose stools 06/15/2014  . Respiratory failure ([place]) 06/03/2014  . Acute respiratory failure ([place]) 06/03/2014  .  Pneumonia, organism unspecified(486) 06/03/2014  . Elevated troponin 06/03/2014  . NSTEMI (non-ST elevated myocardial infarction) ([place]) 06/03/2014  . Acute on chronic diastolic heart failure ([place]) 06/03/2014  . Peripheral vascular disease, unspecified ([place]) 04/26/2014  . Melena 03/03/2014  . Cirrhosis, alcoholic ([place]) 64/40/3474  . Alcohol withdrawal delirium ([place]) 10/09/2012  . Colon adenomas 06/18/2012  . Degenerative joint disease   . LBBB (left bundle branch block)   . Tobacco abuse   . Alcohol dependence ([place])   . Thrombocytopenia ([place])   . Hypertensive heart disease   . Anxiety and depression   . Hyperlipidemia 10/03/2010  . CEREBROVASCULAR DISEASE 10/03/2010  . CHRONIC OBSTRUCTIVE PULMONARY DISEASE 10/03/2010  . DEGENERATIVE JOINT DISEASE, SHOULDER 10/02/2007      Geraldine Solar PT, DPT  [facility] [address] [place], [place], [address] Phone: [phone]   Fax:  [phone]  Name: Roy [name] MRN: [id number] Date of Birth: 1946-08-11

## 2018-05-13 ENCOUNTER — Ambulatory Visit (HOSPITAL_COMMUNITY): Payer: Medicare Other

## 2018-05-13 DIAGNOSIS — M62838 Other muscle spasm: Secondary | ICD-10-CM | POA: Diagnosis not present

## 2018-05-13 DIAGNOSIS — R293 Abnormal posture: Secondary | ICD-10-CM | POA: Diagnosis not present

## 2018-05-13 DIAGNOSIS — M542 Cervicalgia: Secondary | ICD-10-CM | POA: Diagnosis not present

## 2018-05-13 NOTE — Therapy (Signed)
[place], [place], [address] Phone: [phone]   Fax:  [phone]  Physical Therapy Treatment  Patient Details  Name: Roy [name] MRN: [id number] Date of Birth: [birth year] Referring Provider: Celene Squibb, MD   Encounter Date: 05/13/2018  PT End of Session - 05/13/18 0901    Visit Number  6    Number of Visits  9    Date for PT Re-Evaluation  05/23/18    Authorization Type  Medicare (Secondary: T[employer])    Authorization Time Period  04/25/18 to 05/23/18    Authorization - Visit Number  6    Authorization - Number of Visits  10    PT Start Time  0900    PT Stop Time  0932    PT Time Calculation (min)  32 min    Activity Tolerance  Patient tolerated treatment well;No increased pain    Behavior During Therapy  WFL for tasks assessed/performed       Past Medical History:  Diagnosis Date  . AICD (automatic cardioverter/defibrillator) present 08/19/2015   [employer] Jude BiV ICD for primary prevention by Dr. Lovena Le  . Alcohol abuse    6 beers per day; hospital admission in 2009 for withdrawal symptoms  . Alcoholic cirrhosis ([place])   . Anxiety and depression   . Cerebrovascular disease 2009   TIA; 2009- right ICA stent; re-intervention for restenosis complicated by [facility] w/o sx  . CHF (congestive heart failure) ([place]) 06-02-14  . Chronic obstructive pulmonary disease ([place])   . Degenerative joint disease    Total shoulder arthroplasty-right  . Hyperlipidemia   . Hypertension   . LBBB (left bundle branch block)    Normal echo-2011; stress nuclear in 09/2010--septal hypoperfusion representing nontransmural infarction or the effect of left bundle branch block, no ischemia  . Myocardial infarction [facility]) June 02, 2014   Massive Heart Attack  . Peripheral vascular disease ([place])   . 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  . Thrombocytopenia ([place])   . Tobacco abuse    -100 pack years; 1.5 packs per day  .  Traumatic seroma of thigh ([place])    left  . Tubular adenoma of colon     Past Surgical History:  Procedure Laterality Date  . ABDOMINAL AORTAGRAM [name] 05/04/2014   Procedure: ABDOMINAL Maxcine Ham;  Surgeon: [name], MD;  Location: [facility] CATH LAB;  Service: Cardiovascular;  Laterality: [name];  . ABDOMINAL AORTOGRAM [name] 06/25/2017   Procedure: Abdominal Aortogram;  Surgeon: [name], MD;  Location: [place] CV LAB;  Service: Cardiovascular;  Laterality: [name];  . ABDOMINAL AORTOGRAM W/LOWER EXTREMITY [name] 04/22/2018   Procedure: ABDOMINAL AORTOGRAM W/LOWER EXTREMITY;  Surgeon: [name], MD;  Location: [place] CV LAB;  Service: Cardiovascular;  Laterality: [name];  . AGILE CAPSULE [name] 03/18/2014   Procedure: AGILE CAPSULE;  Surgeon: Danie Binder, MD;  [facility];  Service: Endoscopy;  Laterality: [name];  7:30  . APPLICATION OF WOUND VAC Left 09/03/2017   Procedure: APPLICATION OF WOUND VAC;  Surgeon: Conrad Alba, MD;  Location: [place];  Service: Vascular;  Laterality: Left;  . BACK SURGERY    . BACTERIAL OVERGROWTH TEST [name] 05/24/2015   Procedure: BACTERIAL OVERGROWTH TEST;  Surgeon: Danie Binder, MD;  [facility];  Service: Endoscopy;  Laterality: [name];  0700  . BI-VENTRICULAR IMPLANTABLE CARDIOVERTER DEFIBRILLATOR  (CRT-D)  08/19/2015  . CARPAL TUNNEL RELEASE Left 02/02/2016   Procedure:  LEFT CARPAL TUNNEL RELEASE;  Surgeon: Daryll Brod, MD;  Location: [place];  Service: Orthopedics;  Laterality: Left;  ANESTHESIA: IV REGIONAL UPPER ARM  . CATARACT EXTRACTION W/PHACO Right 03/08/2015   Procedure: CATARACT EXTRACTION PHACO AND INTRAOCULAR LENS PLACEMENT (IOC);  Surgeon: Rutherford Guys, MD;  Location: [facility];  Service: Ophthalmology;  Laterality: Right;  CDE:9.46  . CATARACT EXTRACTION W/PHACO Left 03/22/2015   Procedure: CATARACT EXTRACTION PHACO AND INTRAOCULAR LENS PLACEMENT (IOC);  Surgeon: Rutherford Guys, MD;  Location: [facility];  Service:  Ophthalmology;  Laterality: Left;  CDE:5.80  . COLONOSCOPY  08/22/09   Fields-(Tubular Adenoma)3-mm transverse polyp/4-mm polyp otherwise noraml/small internal hemorrhoids  . COLONOSCOPY [name] 04/14/2014   BPZ:WCHEN internal hemorrhids/normal mocsa in the terminal iluem/left colonis redundant  . COLONOSCOPY W/ POLYPECTOMY  2011  . ENDARTERECTOMY FEMORAL Left 08/16/2017   Procedure: ENDARTERECTOMY LEFT PROFUNDA FEMORAL;  Surgeon: [name], MD;  Location: [place];  Service: Vascular;  Laterality: Left;  . EP IMPLANTABLE DEVICE [name] 08/19/2015   Procedure: BiV ICD Insertion CRT-D;  Surgeon: Evans Lance, MD;  Location: [place] CV LAB;  Service: Cardiovascular;  Laterality: [name];  . ESOPHAGOGASTRODUODENOSCOPY [name] 03/05/2014   SLF: 1. Stricture at the gastroesophagael junction 2. Small hiatal hernia 3. Moderate non-erosive gastritis and duodentitis. 4. No surce for Melena identified.   . FEMORAL-POPLITEAL BYPASS GRAFT Left 08/16/2017   Procedure: LEFT  FEMORAL-POPLITEAL ARTERY  BYPASS GRAFT;  Surgeon: [name], MD;  Location: [place];  Service: Vascular;  Laterality: Left;  . GIVENS CAPSULE STUDY [name] 03/30/2014   Procedure: GIVENS CAPSULE STUDY;  Surgeon: Danie Binder, MD;  [facility];  Service: Endoscopy;  Laterality: [name];  7:30  . I&D EXTREMITY Left 09/03/2017   Procedure: IRRIGATION AND DEBRIDEMENT EXTREMITY LEFT THIGH SEROMA;  Surgeon: Conrad Wickliffe, MD;  Location: [facility] OR;  Service: Vascular;  Laterality: Left;  . IR ANGIO INTRA EXTRACRAN SEL COM CAROTID INNOMINATE BILAT MOD SED  07/16/2017  . IR ANGIO VERTEBRAL SEL SUBCLAVIAN INNOMINATE BILAT MOD SED  07/16/2017  . IR RADIOLOGIST EVAL & MGMT  04/01/2017  . IR RADIOLOGIST EVAL & MGMT  08/02/2017  . JOINT REPLACEMENT Right   . LOWER EXTREMITY ANGIOGRAPHY Bilateral 06/25/2017   Procedure: Lower Extremity Angiography;  Surgeon: [name], MD;  Location: [place] CV LAB;  Service: Cardiovascular;  Laterality: Bilateral;  .  LUMBAR FUSION  2010  . PERIPHERAL VASCULAR ATHERECTOMY Right 04/22/2018   Procedure: PERIPHERAL VASCULAR ATHERECTOMY;  Surgeon: [name], MD;  Location: [place] CV LAB;  Service: Cardiovascular;  Laterality: Right;  right superficial femoral  . PERIPHERAL VASCULAR BALLOON ANGIOPLASTY Right 04/22/2018   Procedure: PERIPHERAL VASCULAR BALLOON ANGIOPLASTY;  Surgeon: [name], MD;  Location: [place] CV LAB;  Service: Cardiovascular;  Laterality: Right;  external iliac  . TOTAL SHOULDER ARTHROPLASTY Right 2011   Dr. Tamera Punt  . ULNAR NERVE TRANSPOSITION Left 02/02/2016   Procedure: LEFT IN-SITU DECOMPRESSION ULNAR NERVE ;  Surgeon: Daryll Brod, MD;  Location: [place];  Service: Orthopedics;  Laterality: Left;  . ULNAR TUNNEL RELEASE Left 02/02/2016   Procedure: LEFT CUBITAL TUNNEL RELEASE;  Surgeon: Daryll Brod, MD;  Location: [place];  Service: Orthopedics;  Laterality: Left;  [name] VASECTOMY  1971  . VEIN HARVEST Left 08/16/2017   Procedure: USING NON REVERSE LEFT GREATER SAPHENOUS VEIN HARVEST;  Surgeon: [name], MD;  Location: [place];  Service: Vascular;  Laterality:  Left;    There were no vitals filed for this visit.  Subjective Assessment - 05/13/18 0901    Subjective  Pt reports that his neck is sore but it doesn't hurt. His lower back is still really bothering him from where he strained it the other week.    Pertinent History  R TSA    Limitations  Lifting    How long can you sit comfortably?  no issues    How long can you stand comfortably?  no issues    How long can you walk comfortably?  no issues    Patient Stated Goals  not as much to no pain     Currently in Pain?  No/denies    Pain Onset  More than a month ago             [facility] Adult PT Treatment/Exercise - 05/13/18 0001      Manual Therapy   Manual Therapy  Soft tissue mobilization    Manual therapy comments  separate rest of treatment    Soft tissue  mobilization  STM R upper trap and levator scap for pain control and decreasing restrictions after needling      Trigger Point Dry Needling - 05/13/18 0934    Consent Given?  Yes    Education Handout Provided  No    Muscles Treated Upper Body  Upper trapezius;Levator scapulae    Upper Trapezius Response  Twitch reponse elicited;Palpable increased muscle length R in prone    Levator Scapulae Response  Twitch response elicited;Palpable increased muscle length R             PT Short Term Goals - 04/25/18 1145      PT SHORT TERM GOAL #1   Title  Pt will be independent with HEP and perform consistently in order to decrease pain and improve function.    Time  2    Period  Weeks    Status  New    Target Date  05/09/18      PT SHORT TERM GOAL #2   Title  Pt will have improved cervical AROM by 5 deg throughout in order to decrease pain and improve overall function.    Time  2    Period  Weeks    Status  New        PT Long Term Goals - 04/25/18 1145      PT LONG TERM GOAL #1   Title  Pt will have improved cervical AROM by 10 deg in cervical extension and bil rotation in order to decrease pain and maximize his ability to drive with greater ease.     Time  4    Period  Weeks    Status  New    Target Date  05/23/18      PT LONG TERM GOAL #2   Title  Pt will have at least 4+/5 cervical MMT in order to decrease pain and maximize his ability to perform household and community responsibilities without pain and with greater ease.    Time  4    Period  Weeks    Status  New      PT LONG TERM GOAL #3   Title  Pt will demo mild soft tissue restrictions of R upper trap and R levator scap in order to decrease pain and improve overall cervical ROM.    Time  4    Period  Weeks    Status  New  Plan - 05/13/18 0937    Clinical Impression Statement  Resumed dry needling this date to address restrictions remaining in R upper trap and levator scap. Good twitch responses  elicited, especially at lateral upper trap. Ended with manual to further promote muscle relaxation and pain control. Pt wishing to ended session early since he is in acute LBP. PT educated pt to take it easy today and that he could put heat or ice on his shoulder to help with the pain and he verbalized understanding. Continue as planned, progressing as able.     Rehab Potential  Good    PT Frequency  2x / week    PT Duration  4 weeks    PT Treatment/Interventions  ADLs/Self Care Home Management;Cryotherapy;Electrical Stimulation;Moist Heat;Ultrasound;Functional mobility training;Therapeutic activities;Therapeutic exercise;Neuromuscular re-education;Patient/family education;Manual techniques;Passive range of motion;Dry needling;Energy conservation;Taping    PT Next Visit Plan  continue dry needling for upper trap and levator scap; postural education and strenghtening and progress as able; continue thoracic mobility work; cervical joint mobs PRN; add pec minor release and continue pec minor stretching    PT Home Exercise Plan  eval: seated upper trap and levator scap stretching on R; 6/7: cervical retractions; 6/14: scap retraction, GH extension, high rows GTB    Consulted and Agree with Plan of Care  Patient       Patient will benefit from skilled therapeutic intervention in order to improve the following deficits and impairments:  Decreased range of motion, Decreased strength, Hypomobility, Increased fascial restricitons, Increased muscle spasms, Impaired flexibility, Impaired UE functional use, Improper body mechanics, Postural dysfunction, Pain  Visit Diagnosis: Cervicalgia  Abnormal posture  Other muscle spasm     Problem List Patient Active Problem List   Diagnosis Date Noted  . Complication of surgical procedure 09/03/2017  . History of alcohol abuse 08/19/2017  . PAD (peripheral artery disease) ([place]) 08/16/2017  . ICD (implantable cardioverter-defibrillator), biventricular, in situ  12/01/2015  . Cardiomyopathy, ischemic 08/20/2015  . Normocytic anemia 10/14/2014  . Weight gain 06/29/2014  . Hypokalemia 06/29/2014  . Chronic systolic heart failure ([place]) 06/15/2014  . COPD exacerbation ([place]) 06/15/2014  . Hypernatremia 06/15/2014  . Acute renal failure ([place]) 06/15/2014  . Encephalopathy 06/15/2014  . Loose stools 06/15/2014  . Respiratory failure ([place]) 06/03/2014  . Acute respiratory failure ([place]) 06/03/2014  . Pneumonia, organism unspecified(486) 06/03/2014  . Elevated troponin 06/03/2014  . NSTEMI (non-ST elevated myocardial infarction) ([place]) 06/03/2014  . Acute on chronic diastolic heart failure ([place]) 06/03/2014  . Peripheral vascular disease, unspecified ([place]) 04/26/2014  . Melena 03/03/2014  . Cirrhosis, alcoholic ([place]) 47/42/5956  . Alcohol withdrawal delirium ([place]) 10/09/2012  . Colon adenomas 06/18/2012  . Degenerative joint disease   . LBBB (left bundle branch block)   . Tobacco abuse   . Alcohol dependence ([place])   . Thrombocytopenia ([place])   . Hypertensive heart disease   . Anxiety and depression   . Hyperlipidemia 10/03/2010  . CEREBROVASCULAR DISEASE 10/03/2010  . CHRONIC OBSTRUCTIVE PULMONARY DISEASE 10/03/2010  . DEGENERATIVE JOINT DISEASE, SHOULDER 10/02/2007       Geraldine Solar PT, DPT  [place] [address] [place], [place], [address] Phone: [phone]   Fax:  [phone]  Name: Roy [name] MRN: [id number] Date of Birth: Jun 15, 1946

## 2018-05-16 ENCOUNTER — Ambulatory Visit (HOSPITAL_COMMUNITY): Payer: Medicare Other

## 2018-05-16 DIAGNOSIS — R293 Abnormal posture: Secondary | ICD-10-CM

## 2018-05-16 DIAGNOSIS — M62838 Other muscle spasm: Secondary | ICD-10-CM

## 2018-05-16 DIAGNOSIS — M542 Cervicalgia: Secondary | ICD-10-CM | POA: Diagnosis not present

## 2018-05-16 NOTE — Therapy (Signed)
[place], [place], [address] Phone: [phone]   Fax:  [phone]  Physical Therapy Treatment  Patient Details  Name: Roy [name] MRN: [id number] Date of Birth: 1945/12/22 Referring Provider: Celene Squibb, MD   Encounter Date: 05/16/2018  PT End of Session - 05/16/18 0903    Visit Number  7    Number of Visits  9    Date for PT Re-Evaluation  05/23/18    Authorization Type  Medicare (Secondary: [employer])    Authorization Time Period  04/25/18 to 05/23/18    Authorization - Visit Number  7    Authorization - Number of Visits  10    PT Start Time  0901    PT Stop Time  4536    PT Time Calculation (min)  42 min    Activity Tolerance  Patient tolerated treatment well;No increased pain    Behavior During Therapy  WFL for tasks assessed/performed       Past Medical History:  Diagnosis Date  . AICD (automatic cardioverter/defibrillator) present 08/19/2015   St Jude BiV ICD for primary prevention by Dr. Lovena Le  . Alcohol abuse    6 beers per day; hospital admission in 2009 for withdrawal symptoms  . Alcoholic cirrhosis ([place])   . Anxiety and depression   . Cerebrovascular disease 2009   TIA; 2009- right ICA stent; re-intervention for restenosis complicated by [facility] w/o sx  . CHF (congestive heart failure) ([place]) 06-02-14  . Chronic obstructive pulmonary disease ([place])   . Degenerative joint disease    Total shoulder arthroplasty-right  . Hyperlipidemia   . Hypertension   . LBBB (left bundle branch block)    Normal echo-2011; stress nuclear in 09/2010--septal hypoperfusion representing nontransmural infarction or the effect of left bundle branch block, no ischemia  . Myocardial infarction [facility]) June 02, 2014   Massive Heart Attack  . Peripheral vascular disease ([place])   . 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  . Thrombocytopenia ([place])   . Tobacco abuse    -100 pack years; 1.5 packs per day  .  Traumatic seroma of thigh ([place])    left  . Tubular adenoma of colon     Past Surgical History:  Procedure Laterality Date  . ABDOMINAL AORTAGRAM [name] 05/04/2014   Procedure: ABDOMINAL Maxcine Ham;  Surgeon: [name], MD;  Location: [facility] CATH LAB;  Service: Cardiovascular;  Laterality: [name];  . ABDOMINAL AORTOGRAM [name] 06/25/2017   Procedure: Abdominal Aortogram;  Surgeon: [name], MD;  Location: [place] CV LAB;  Service: Cardiovascular;  Laterality: [name];  . ABDOMINAL AORTOGRAM W/LOWER EXTREMITY [name] 04/22/2018   Procedure: ABDOMINAL AORTOGRAM W/LOWER EXTREMITY;  Surgeon: [name], MD;  Location: [place] CV LAB;  Service: Cardiovascular;  Laterality: [name];  . AGILE CAPSULE [name] 03/18/2014   Procedure: AGILE CAPSULE;  Surgeon: Danie Binder, MD;  [facility];  Service: Endoscopy;  Laterality: [name];  7:30  . APPLICATION OF WOUND VAC Left 09/03/2017   Procedure: APPLICATION OF WOUND VAC;  Surgeon: Conrad , MD;  Location: [place];  Service: Vascular;  Laterality: Left;  . BACK SURGERY    . BACTERIAL OVERGROWTH TEST [name] 05/24/2015   Procedure: BACTERIAL OVERGROWTH TEST;  Surgeon: Danie Binder, MD;  [facility];  Service: Endoscopy;  Laterality: [name];  0700  . BI-VENTRICULAR IMPLANTABLE CARDIOVERTER DEFIBRILLATOR  (CRT-D)  08/19/2015  . CARPAL TUNNEL RELEASE Left 02/02/2016   Procedure:  LEFT CARPAL TUNNEL RELEASE;  Surgeon: Daryll Brod, MD;  Location: [place];  Service: Orthopedics;  Laterality: Left;  ANESTHESIA: IV REGIONAL UPPER ARM  . CATARACT EXTRACTION W/PHACO Right 03/08/2015   Procedure: CATARACT EXTRACTION PHACO AND INTRAOCULAR LENS PLACEMENT (IOC);  Surgeon: Rutherford Guys, MD;  Location: [facility] OR[facility];  Service: Ophthalmology;  Laterality: Right;  CDE:9.46  . CATARACT EXTRACTION W/PHACO Left 03/22/2015   Procedure: CATARACT EXTRACTION PHACO AND INTRAOCULAR LENS PLACEMENT (IOC);  Surgeon: Rutherford Guys, MD;  Location: [facility];  Service:  Ophthalmology;  Laterality: Left;  CDE:5.80  . COLONOSCOPY  08/22/09   Fields-(Tubular Adenoma)3-mm transverse polyp/4-mm polyp otherwise noraml/small internal hemorrhoids  . COLONOSCOPY [name] 04/14/2014   BPZ:WCHEN internal hemorrhids/normal mocsa in the terminal iluem/left colonis redundant  . COLONOSCOPY W/ POLYPECTOMY  2011  . ENDARTERECTOMY FEMORAL Left 08/16/2017   Procedure: ENDARTERECTOMY LEFT PROFUNDA FEMORAL;  Surgeon: [name], MD;  Location: [place];  Service: Vascular;  Laterality: Left;  . EP IMPLANTABLE DEVICE [name] 08/19/2015   Procedure: BiV ICD Insertion CRT-D;  Surgeon: Evans Lance, MD;  Location: [place] CV LAB;  Service: Cardiovascular;  Laterality: [name];  . ESOPHAGOGASTRODUODENOSCOPY [name] 03/05/2014   SLF: 1. Stricture at the gastroesophagael junction 2. Small hiatal hernia 3. Moderate non-erosive gastritis and duodentitis. 4. No surce for Melena identified.   . FEMORAL-POPLITEAL BYPASS GRAFT Left 08/16/2017   Procedure: LEFT  FEMORAL-POPLITEAL ARTERY  BYPASS GRAFT;  Surgeon: [name], MD;  Location: [place];  Service: Vascular;  Laterality: Left;  . GIVENS CAPSULE STUDY [name] 03/30/2014   Procedure: GIVENS CAPSULE STUDY;  Surgeon: Danie Binder, MD;  [facility];  Service: Endoscopy;  Laterality: [name];  7:30  . I&D EXTREMITY Left 09/03/2017   Procedure: IRRIGATION AND DEBRIDEMENT EXTREMITY LEFT THIGH SEROMA;  Surgeon: Conrad Wickliffe, MD;  Location: [facility] OR;  Service: Vascular;  Laterality: Left;  . IR ANGIO INTRA EXTRACRAN SEL COM CAROTID INNOMINATE BILAT MOD SED  07/16/2017  . IR ANGIO VERTEBRAL SEL SUBCLAVIAN INNOMINATE BILAT MOD SED  07/16/2017  . IR RADIOLOGIST EVAL & MGMT  04/01/2017  . IR RADIOLOGIST EVAL & MGMT  08/02/2017  . JOINT REPLACEMENT Right   . LOWER EXTREMITY ANGIOGRAPHY Bilateral 06/25/2017   Procedure: Lower Extremity Angiography;  Surgeon: [name], MD;  Location: [place] CV LAB;  Service: Cardiovascular;  Laterality: Bilateral;  .  LUMBAR FUSION  2010  . PERIPHERAL VASCULAR ATHERECTOMY Right 04/22/2018   Procedure: PERIPHERAL VASCULAR ATHERECTOMY;  Surgeon: [name], MD;  Location: [place] CV LAB;  Service: Cardiovascular;  Laterality: Right;  right superficial femoral  . PERIPHERAL VASCULAR BALLOON ANGIOPLASTY Right 04/22/2018   Procedure: PERIPHERAL VASCULAR BALLOON ANGIOPLASTY;  Surgeon: [name], MD;  Location: [place] CV LAB;  Service: Cardiovascular;  Laterality: Right;  external iliac  . TOTAL SHOULDER ARTHROPLASTY Right 2011   Dr. Tamera Punt  . ULNAR NERVE TRANSPOSITION Left 02/02/2016   Procedure: LEFT IN-SITU DECOMPRESSION ULNAR NERVE ;  Surgeon: Daryll Brod, MD;  Location: [place];  Service: Orthopedics;  Laterality: Left;  . ULNAR TUNNEL RELEASE Left 02/02/2016   Procedure: LEFT CUBITAL TUNNEL RELEASE;  Surgeon: Daryll Brod, MD;  Location: [place];  Service: Orthopedics;  Laterality: Left;  [name] VASECTOMY  1971  . VEIN HARVEST Left 08/16/2017   Procedure: USING NON REVERSE LEFT GREATER SAPHENOUS VEIN HARVEST;  Surgeon: [name], MD;  Location: [place];  Service: Vascular;  Laterality:  Left;    There were no vitals filed for this visit.  Subjective Assessment - 05/16/18 0903    Subjective  Pt states that his lower back feels much better after using his heating pad. His neck is feeling much better as well.     Pertinent History  R TSA    Limitations  Lifting    How long can you sit comfortably?  no issues    How long can you stand comfortably?  no issues    How long can you walk comfortably?  no issues    Patient Stated Goals  not as much to no pain     Currently in Pain?  No/denies    Pain Onset  More than a month ago           [facility] Adult PT Treatment/Exercise - 05/16/18 0001      Neck Exercises: Machines for Strengthening   UBE (Upper Arm Bike)  x3 mins retro, L4, postural strength      Neck Exercises: Standing   Wall Push Ups  15 reps     Wall Push Ups Limitations  on counter    Upper Extremity D2  Flexion;15 reps;10 reps;Theraband;Limitations    Theraband Level (UE D2)  Level 3 (Green)    UE D2 Limitations  2 sets, +cervical retraction, 10 reps 2nd set due to fatigue    Left / Chop Limitations  Money GTB x10 reps    Other Standing Exercises  Y's on wall with liftoff with RTB x15, x12 reps    Other Standing Exercises  horiz abd with GTB x15reps, x10 reps (limited due to fatigue)      Neck Exercises: Supine   Other Supine Exercise  thoracic extension over 1/2 foam roll x5 reps each at 3-4 different segments      Manual Therapy   Manual Therapy  Myofascial release;Soft tissue mobilization    Manual therapy comments  separate rest of treatment    Soft tissue mobilization  STM R upper trap and levator scap for pain control and decreasing restrictions    Myofascial Release  pec minor release with pt in supine over 1/2 foam roll      Neck Exercises: Stretches   Other Neck Stretches  pec stretch over 1/2 foam roll in supine x1 min    Other Neck Stretches  R pec stretch in doorway (RUE only, hand low, rotating trunk/chest to the L) 3x30"           PT Education - 05/16/18 0903    Education Details  exercise technique, continue HEP    Person(s) Educated  Patient    Methods  Explanation;Demonstration    Comprehension  Verbalized understanding;Returned demonstration       PT Short Term Goals - 04/25/18 1145      PT SHORT TERM GOAL #1   Title  Pt will be independent with HEP and perform consistently in order to decrease pain and improve function.    Time  2    Period  Weeks    Status  New    Target Date  05/09/18      PT SHORT TERM GOAL #2   Title  Pt will have improved cervical AROM by 5 deg throughout in order to decrease pain and improve overall function.    Time  2    Period  Weeks    Status  New        PT Long Term Goals - 04/25/18 1145  PT LONG TERM GOAL #1   Title  Pt will have improved cervical  AROM by 10 deg in cervical extension and bil rotation in order to decrease pain and maximize his ability to drive with greater ease.     Time  4    Period  Weeks    Status  New    Target Date  05/23/18      PT LONG TERM GOAL #2   Title  Pt will have at least 4+/5 cervical MMT in order to decrease pain and maximize his ability to perform household and community responsibilities without pain and with greater ease.    Time  4    Period  Weeks    Status  New      PT LONG TERM GOAL #3   Title  Pt will demo mild soft tissue restrictions of R upper trap and R levator scap in order to decrease pain and improve overall cervical ROM.    Time  4    Period  Weeks    Status  New            Plan - 05/16/18 0945    Clinical Impression Statement  Pt presenting to therapy with much improved pain in his lower back and was not limited during session today. He also reported that his neck is feeling much better. Continued with postural strengthening and able to progress pt this date. He denied any pain but did demo and verbalized muscle fatigue during strengthening. Added pec minor release with pt in supine to continue to facilitate proper posture and scapulothoracic kinematics. Ended with manual STM to address continued restrictions in upper trap and levator scap. Pt progressing very nicely towards goals; he only had 1 minor taut band/knot remaining in his upper trap/levator scap region and he reported it was less painful to palpation. Will resume dry needling next visit to address this last little bit of soft tissue restrictions in order to further decrease pain and improve overall function.    Rehab Potential  Good    PT Frequency  2x / week    PT Duration  4 weeks    PT Treatment/Interventions  ADLs/Self Care Home Management;Cryotherapy;Electrical Stimulation;Moist Heat;Ultrasound;Functional mobility training;Therapeutic activities;Therapeutic exercise;Neuromuscular re-education;Patient/family  education;Manual techniques;Passive range of motion;Dry needling;Energy conservation;Taping    PT Next Visit Plan  continue dry needling for upper trap and levator scap; postural education and strenghtening and progress as able; continue thoracic mobility work; cervical joint mobs PRN; continue pec minor release and continue pec minor stretching    PT Home Exercise Plan  eval: seated upper trap and levator scap stretching on R; 6/7: cervical retractions; 6/14: scap retraction, GH extension, high rows GTB    Consulted and Agree with Plan of Care  Patient       Patient will benefit from skilled therapeutic intervention in order to improve the following deficits and impairments:  Decreased range of motion, Decreased strength, Hypomobility, Increased fascial restricitons, Increased muscle spasms, Impaired flexibility, Impaired UE functional use, Improper body mechanics, Postural dysfunction, Pain  Visit Diagnosis: Cervicalgia  Abnormal posture  Other muscle spasm     Problem List Patient Active Problem List   Diagnosis Date Noted  . Complication of surgical procedure 09/03/2017  . History of alcohol abuse 08/19/2017  . PAD (peripheral artery disease) ([place]) 08/16/2017  . ICD (implantable cardioverter-defibrillator), biventricular, in situ 12/01/2015  . Cardiomyopathy, ischemic 08/20/2015  . Normocytic anemia 10/14/2014  . Weight gain 06/29/2014  . Hypokalemia 06/29/2014  .  Chronic systolic heart failure ([place]) 06/15/2014  . COPD exacerbation ([place]) 06/15/2014  . Hypernatremia 06/15/2014  . Acute renal failure ([place]) 06/15/2014  . Encephalopathy 06/15/2014  . Loose stools 06/15/2014  . Respiratory failure ([place]) 06/03/2014  . Acute respiratory failure ([place]) 06/03/2014  . Pneumonia, organism unspecified(486) 06/03/2014  . Elevated troponin 06/03/2014  . NSTEMI (non-ST elevated myocardial infarction) ([place]) 06/03/2014  . Acute on chronic diastolic heart failure ([place]) 06/03/2014  .  Peripheral vascular disease, unspecified ([place]) 04/26/2014  . Melena 03/03/2014  . Cirrhosis, alcoholic ([place]) 03/47/4259  . Alcohol withdrawal delirium ([place]) 10/09/2012  . Colon adenomas 06/18/2012  . Degenerative joint disease   . LBBB (left bundle branch block)   . Tobacco abuse   . Alcohol dependence ([place])   . Thrombocytopenia ([place])   . Hypertensive heart disease   . Anxiety and depression   . Hyperlipidemia 10/03/2010  . CEREBROVASCULAR DISEASE 10/03/2010  . CHRONIC OBSTRUCTIVE PULMONARY DISEASE 10/03/2010  . DEGENERATIVE JOINT DISEASE, SHOULDER 10/02/2007        Geraldine Solar PT, DPT  [facility] [address] [place], [place], [address] Phone: [phone]   Fax:  [phone]  Name: Roy [name] MRN: [id number] Date of Birth: 08/28/1946

## 2018-05-20 ENCOUNTER — Ambulatory Visit (HOSPITAL_COMMUNITY): Payer: Medicare Other

## 2018-05-20 DIAGNOSIS — M62838 Other muscle spasm: Secondary | ICD-10-CM | POA: Diagnosis not present

## 2018-05-20 DIAGNOSIS — M542 Cervicalgia: Secondary | ICD-10-CM

## 2018-05-20 DIAGNOSIS — R293 Abnormal posture: Secondary | ICD-10-CM | POA: Diagnosis not present

## 2018-05-20 NOTE — Therapy (Signed)
[facility] [place], [place], [address] Phone: [phone]   Fax:  [phone]  Physical Therapy Treatment  Patient Details  Name: Roy [name] MRN: [id number] Date of Birth: [birth year] Referring Provider: Celene Squibb, MD   Encounter Date: 05/20/2018  PT End of Session - 05/20/18 0903    Visit Number  8    Number of Visits  9    Date for PT Re-Evaluation  05/23/18    Authorization Type  Medicare (Secondary: T[employer]are)    Authorization Time Period  04/25/18 to 05/23/18    Authorization - Visit Number  8    Authorization - Number of Visits  10    PT Start Time  0900    PT Stop Time  0940    PT Time Calculation (min)  40 min    Activity Tolerance  Patient tolerated treatment well;No increased pain    Behavior During Therapy  WFL for tasks assessed/performed       Past Medical History:  Diagnosis Date  . AICD (automatic cardioverter/defibrillator) present 08/19/2015   St Jude BiV ICD for primary prevention by Dr. Lovena Le  . Alcohol abuse    6 beers per day; hospital admission in 2009 for withdrawal symptoms  . Alcoholic cirrhosis ([place])   . Anxiety and depression   . Cerebrovascular disease 2009   TIA; 2009- right ICA stent; re-intervention for restenosis complicated by [facility] w/o sx  . CHF (congestive heart failure) ([place]) 06-02-14  . Chronic obstructive pulmonary disease ([place])   . Degenerative joint disease    Total shoulder arthroplasty-right  . Hyperlipidemia   . Hypertension   . LBBB (left bundle branch block)    Normal echo-2011; stress nuclear in 09/2010--septal hypoperfusion representing nontransmural infarction or the effect of left bundle branch block, no ischemia  . Myocardial infarction [facility]) June 02, 2014   Massive Heart Attack  . Peripheral vascular disease ([place])   . 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  . Thrombocytopenia ([place])   . Tobacco abuse    -100 pack years; 1.5 packs per day  .  Traumatic seroma of thigh ([place])    left  . Tubular adenoma of colon     Past Surgical History:  Procedure Laterality Date  . ABDOMINAL AORTAGRAM [name] 05/04/2014   Procedure: ABDOMINAL Maxcine Ham;  Surgeon: [name], MD;  Location: [facility] CATH LAB;  Service: Cardiovascular;  Laterality: [name];  . ABDOMINAL AORTOGRAM [name] 06/25/2017   Procedure: Abdominal Aortogram;  Surgeon: [name], MD;  Location: [place] CV LAB;  Service: Cardiovascular;  Laterality: [name];  . ABDOMINAL AORTOGRAM W/LOWER EXTREMITY [name] 04/22/2018   Procedure: ABDOMINAL AORTOGRAM W/LOWER EXTREMITY;  Surgeon: [name], MD;  Location: [place] CV LAB;  Service: Cardiovascular;  Laterality: [name];  . AGILE CAPSULE [name] 03/18/2014   Procedure: AGILE CAPSULE;  Surgeon: Danie Binder, MD;  [facility];  Service: Endoscopy;  Laterality: [name];  7:30  . APPLICATION OF WOUND VAC Left 09/03/2017   Procedure: APPLICATION OF WOUND VAC;  Surgeon: Conrad Edison, MD;  Location: [place];  Service: Vascular;  Laterality: Left;  . BACK SURGERY    . BACTERIAL OVERGROWTH TEST [name] 05/24/2015   Procedure: BACTERIAL OVERGROWTH TEST;  Surgeon: Danie Binder, MD;  [facility];  Service: Endoscopy;  Laterality: [name];  0700  . BI-VENTRICULAR IMPLANTABLE CARDIOVERTER DEFIBRILLATOR  (CRT-D)  08/19/2015  . CARPAL TUNNEL RELEASE Left 02/02/2016   Procedure:  LEFT CARPAL TUNNEL RELEASE;  Surgeon: Daryll Brod, MD;  Location: [place];  Service: Orthopedics;  Laterality: Left;  ANESTHESIA: IV REGIONAL UPPER ARM  . CATARACT EXTRACTION W/PHACO Right 03/08/2015   Procedure: CATARACT EXTRACTION PHACO AND INTRAOCULAR LENS PLACEMENT (IOC);  Surgeon: Rutherford Guys, MD;  Location: [facility];  Service: Ophthalmology;  Laterality: Right;  CDE:9.46  . CATARACT EXTRACTION W/PHACO Left 03/22/2015   Procedure: CATARACT EXTRACTION PHACO AND INTRAOCULAR LENS PLACEMENT (IOC);  Surgeon: Rutherford Guys, MD;  Location: [facility];  Service:  Ophthalmology;  Laterality: Left;  CDE:5.80  . COLONOSCOPY  08/22/09   Fields-(Tubular Adenoma)3-mm transverse polyp/4-mm polyp otherwise noraml/small internal hemorrhoids  . COLONOSCOPY [name] 04/14/2014   YNW:GNFAO internal hemorrhids/normal mocsa in the terminal iluem/left colonis redundant  . COLONOSCOPY W/ POLYPECTOMY  2011  . ENDARTERECTOMY FEMORAL Left 08/16/2017   Procedure: ENDARTERECTOMY LEFT PROFUNDA FEMORAL;  Surgeon: [name], MD;  Location: [place];  Service: Vascular;  Laterality: Left;  . EP IMPLANTABLE DEVICE [name] 08/19/2015   Procedure: BiV ICD Insertion CRT-D;  Surgeon: Evans Lance, MD;  Location: [place] CV LAB;  Service: Cardiovascular;  Laterality: [name];  . ESOPHAGOGASTRODUODENOSCOPY [name] 03/05/2014   SLF: 1. Stricture at the gastroesophagael junction 2. Small hiatal hernia 3. Moderate non-erosive gastritis and duodentitis. 4. No surce for Melena identified.   . FEMORAL-POPLITEAL BYPASS GRAFT Left 08/16/2017   Procedure: LEFT  FEMORAL-POPLITEAL ARTERY  BYPASS GRAFT;  Surgeon: [name], MD;  Location: [place];  Service: Vascular;  Laterality: Left;  . GIVENS CAPSULE STUDY [name] 03/30/2014   Procedure: GIVENS CAPSULE STUDY;  Surgeon: Danie Binder, MD;  [facility];  Service: Endoscopy;  Laterality: [name];  7:30  . I&D EXTREMITY Left 09/03/2017   Procedure: IRRIGATION AND DEBRIDEMENT EXTREMITY LEFT THIGH SEROMA;  Surgeon: Conrad Betances, MD;  [facility] OR;  Service: Vascular;  Laterality: Left;  . IR ANGIO INTRA EXTRACRAN SEL COM CAROTID INNOMINATE BILAT MOD SED  07/16/2017  . IR ANGIO VERTEBRAL SEL SUBCLAVIAN INNOMINATE BILAT MOD SED  07/16/2017  . IR RADIOLOGIST EVAL & MGMT  04/01/2017  . IR RADIOLOGIST EVAL & MGMT  08/02/2017  . JOINT REPLACEMENT Right   . LOWER EXTREMITY ANGIOGRAPHY Bilateral 06/25/2017   Procedure: Lower Extremity Angiography;  Surgeon: [name], MD;  Location: [place] CV LAB;  Service: Cardiovascular;  Laterality: Bilateral;  .  LUMBAR FUSION  2010  . PERIPHERAL VASCULAR ATHERECTOMY Right 04/22/2018   Procedure: PERIPHERAL VASCULAR ATHERECTOMY;  Surgeon: [name], MD;  Location: [place] CV LAB;  Service: Cardiovascular;  Laterality: Right;  right superficial femoral  . PERIPHERAL VASCULAR BALLOON ANGIOPLASTY Right 04/22/2018   Procedure: PERIPHERAL VASCULAR BALLOON ANGIOPLASTY;  Surgeon: [name], MD;  Location: [place] CV LAB;  Service: Cardiovascular;  Laterality: Right;  external iliac  . TOTAL SHOULDER ARTHROPLASTY Right 2011   Dr. Tamera Punt  . ULNAR NERVE TRANSPOSITION Left 02/02/2016   Procedure: LEFT IN-SITU DECOMPRESSION ULNAR NERVE ;  Surgeon: Daryll Brod, MD;  Location: [place];  Service: Orthopedics;  Laterality: Left;  . ULNAR TUNNEL RELEASE Left 02/02/2016   Procedure: LEFT CUBITAL TUNNEL RELEASE;  Surgeon: Daryll Brod, MD;  Location: [place];  Service: Orthopedics;  Laterality: Left;  [name] VASECTOMY  1971  . VEIN HARVEST Left 08/16/2017   Procedure: USING NON REVERSE LEFT GREATER SAPHENOUS VEIN HARVEST;  Surgeon: [name], MD;  Location: [place];  Service: Vascular;  Laterality:  Left;    There were no vitals filed for this visit.  Subjective Assessment - 05/20/18 0903    Subjective  Pt reports that he is feeling well, very minimal pain reported this morning, stating it is more like soreness than pain.    Pertinent History  R TSA    Limitations  Lifting    How long can you sit comfortably?  no issues    How long can you stand comfortably?  no issues    How long can you walk comfortably?  no issues    Patient Stated Goals  not as much to no pain     Currently in Pain?  No/denies 0.25/10 soreness    Pain Onset  More than a month ago         [facility] PT Assessment - 05/20/18 0001      AROM   Overall AROM Comments  Before needling     Cervical Flexion  67 after: 68    Cervical Extension  33 after: 36    Cervical - Right Side Bend  20 after: 27     Cervical - Left Side Bend  31 after: 27    Cervical - Right Rotation  66 after: 69    Cervical - Left Rotation  60 after: 35           [facility] Adult PT Treatment/Exercise - 05/20/18 0001      Manual Therapy   Manual Therapy  Soft tissue mobilization    Manual therapy comments  separate rest of treatment    Soft tissue mobilization  STM R upper trap and levator scap for pain control after needliing      Neck Exercises: Stretches   Upper Trapezius Stretch  Right;2 reps;30 seconds    Levator Stretch  Right;2 reps;30 seconds       Trigger Point Dry Needling - 05/20/18 0908    Consent Given?  Yes    Education Handout Provided  No    Muscles Treated Upper Body  Upper trapezius;Levator scapulae    Upper Trapezius Response  Twitch reponse elicited;Palpable increased muscle length R in prone    Levator Scapulae Response  Twitch response elicited;Palpable increased muscle length R in prone           PT Short Term Goals - 04/25/18 1145      PT SHORT TERM GOAL #1   Title  Pt will be independent with HEP and perform consistently in order to decrease pain and improve function.    Time  2    Period  Weeks    Status  New    Target Date  05/09/18      PT SHORT TERM GOAL #2   Title  Pt will have improved cervical AROM by 5 deg throughout in order to decrease pain and improve overall function.    Time  2    Period  Weeks    Status  New        PT Long Term Goals - 04/25/18 1145      PT LONG TERM GOAL #1   Title  Pt will have improved cervical AROM by 10 deg in cervical extension and bil rotation in order to decrease pain and maximize his ability to drive with greater ease.     Time  4    Period  Weeks    Status  New    Target Date  05/23/18      PT LONG TERM GOAL #2  Title  Pt will have at least 4+/5 cervical MMT in order to decrease pain and maximize his ability to perform household and community responsibilities without pain and with greater ease.    Time  4    Period   Weeks    Status  New      PT LONG TERM GOAL #3   Title  Pt will demo mild soft tissue restrictions of R upper trap and R levator scap in order to decrease pain and improve overall cervical ROM.    Time  4    Period  Weeks    Status  New            Plan - 05/20/18 0944    Clinical Impression Statement  Continued with dry needling to address remaining soft tissue restrictions in R upper trap and levator scap. New ROM measurements taken before and after needling as illustrated above; major gains made in lateral flexion and rotation after needling. Followed needling with STM for further pain management and ended with upper trap and levator scap stretching. No pain, just soreness reported at EOS. Pt due for reassessment and likely discharge at next visit due to progress made.    Rehab Potential  Good    PT Frequency  2x / week    PT Duration  4 weeks    PT Treatment/Interventions  ADLs/Self Care Home Management;Cryotherapy;Electrical Stimulation;Moist Heat;Ultrasound;Functional mobility training;Therapeutic activities;Therapeutic exercise;Neuromuscular re-education;Patient/family education;Manual techniques;Passive range of motion;Dry needling;Energy conservation;Taping    PT Next Visit Plan  reassessment;  continue dry needling for upper trap and levator scap; postural education and strenghtening and progress as able; continue thoracic mobility work; cervical joint mobs PRN; continue pec minor release and continue pec minor stretching    PT Home Exercise Plan  eval: seated upper trap and levator scap stretching on R; 6/7: cervical retractions; 6/14: scap retraction, GH extension, high rows GTB    Consulted and Agree with Plan of Care  Patient       Patient will benefit from skilled therapeutic intervention in order to improve the following deficits and impairments:  Decreased range of motion, Decreased strength, Hypomobility, Increased fascial restricitons, Increased muscle spasms, Impaired  flexibility, Impaired UE functional use, Improper body mechanics, Postural dysfunction, Pain  Visit Diagnosis: Cervicalgia  Abnormal posture  Other muscle spasm     Problem List Patient Active Problem List   Diagnosis Date Noted  . Complication of surgical procedure 09/03/2017  . History of alcohol abuse 08/19/2017  . PAD (peripheral artery disease) ([place]) 08/16/2017  . ICD (implantable cardioverter-defibrillator), biventricular, in situ 12/01/2015  . Cardiomyopathy, ischemic 08/20/2015  . Normocytic anemia 10/14/2014  . Weight gain 06/29/2014  . Hypokalemia 06/29/2014  . Chronic systolic heart failure ([place]) 06/15/2014  . COPD exacerbation ([place]) 06/15/2014  . Hypernatremia 06/15/2014  . Acute renal failure ([place]) 06/15/2014  . Encephalopathy 06/15/2014  . Loose stools 06/15/2014  . Respiratory failure ([place]) 06/03/2014  . Acute respiratory failure ([place]) 06/03/2014  . Pneumonia, organism unspecified(486) 06/03/2014  . Elevated troponin 06/03/2014  . NSTEMI (non-ST elevated myocardial infarction) ([place]) 06/03/2014  . Acute on chronic diastolic heart failure ([place]) 06/03/2014  . Peripheral vascular disease, unspecified ([place]) 04/26/2014  . Melena 03/03/2014  . Cirrhosis, alcoholic ([place]) 12/87/8676  . Alcohol withdrawal delirium ([place]) 10/09/2012  . Colon adenomas 06/18/2012  . Degenerative joint disease   . LBBB (left bundle branch block)   . Tobacco abuse   . Alcohol dependence ([place])   . Thrombocytopenia ([place])   .  Hypertensive heart disease   . Anxiety and depression   . Hyperlipidemia 10/03/2010  . CEREBROVASCULAR DISEASE 10/03/2010  . CHRONIC OBSTRUCTIVE PULMONARY DISEASE 10/03/2010  . DEGENERATIVE JOINT DISEASE, SHOULDER 10/02/2007        Geraldine Solar PT, DPT  [facility] [address] [place], [place], [address] Phone: [phone]   Fax:  [phone]  Name: Roy [name] MRN: [id number] Date of Birth:  December 01, 1945

## 2018-05-23 ENCOUNTER — Ambulatory Visit (HOSPITAL_COMMUNITY): Payer: Medicare Other

## 2018-05-23 DIAGNOSIS — M542 Cervicalgia: Secondary | ICD-10-CM | POA: Diagnosis not present

## 2018-05-23 DIAGNOSIS — M62838 Other muscle spasm: Secondary | ICD-10-CM | POA: Diagnosis not present

## 2018-05-23 DIAGNOSIS — R293 Abnormal posture: Secondary | ICD-10-CM

## 2018-05-23 NOTE — Therapy (Signed)
[place], [place], [address] Phone: [phone]   Fax:  [phone]  Physical Therapy Treatment/Discharge Summary  Patient Details  Name: Roy [name] MRN: [id number] Date of Birth: 1946-07-11 Referring Provider: Celene Squibb, MD   Encounter Date: 05/23/2018  PT End of Session - 05/23/18 0900    Visit Number  9    Number of Visits  9    Date for PT Re-Evaluation  05/23/18    Authorization Type  Medicare (Secondary: T[employer]are)    Authorization Time Period  04/25/18 to 05/23/18    Authorization - Visit Number  9    Authorization - Number of Visits  10    PT Start Time  0900    PT Stop Time  0915    PT Time Calculation (min)  15 min    Activity Tolerance  Patient tolerated treatment well;No increased pain    Behavior During Therapy  WFL for tasks assessed/performed       Past Medical History:  Diagnosis Date  . AICD (automatic cardioverter/defibrillator) present 08/19/2015   St Jude BiV ICD for primary prevention by Dr. Lovena Le  . Alcohol abuse    6 beers per day; hospital admission in 2009 for withdrawal symptoms  . Alcoholic cirrhosis ([place])   . Anxiety and depression   . Cerebrovascular disease 2009   TIA; 2009- right ICA stent; re-intervention for restenosis complicated by [facility] w/o sx  . CHF (congestive heart failure) ([place]) 06-02-14  . Chronic obstructive pulmonary disease ([place])   . Degenerative joint disease    Total shoulder arthroplasty-right  . Hyperlipidemia   . Hypertension   . LBBB (left bundle branch block)    Normal echo-2011; stress nuclear in 09/2010--septal hypoperfusion representing nontransmural infarction or the effect of left bundle branch block, no ischemia  . Myocardial infarction [facility]) June 02, 2014   Massive Heart Attack  . Peripheral vascular disease ([place])   . 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  . Thrombocytopenia ([place])   . Tobacco abuse    -100 pack years; 1.5 packs  per day  . Traumatic seroma of thigh ([place])    left  . Tubular adenoma of colon     Past Surgical History:  Procedure Laterality Date  . ABDOMINAL AORTAGRAM [name] 05/04/2014   Procedure: ABDOMINAL Maxcine Ham;  Surgeon: [name], MD;  Location: [facility] CATH LAB;  Service: Cardiovascular;  Laterality: [name];  . ABDOMINAL AORTOGRAM [name] 06/25/2017   Procedure: Abdominal Aortogram;  Surgeon: [name], MD;  Location: [place] CV LAB;  Service: Cardiovascular;  Laterality: [name];  . ABDOMINAL AORTOGRAM W/LOWER EXTREMITY [name] 04/22/2018   Procedure: ABDOMINAL AORTOGRAM W/LOWER EXTREMITY;  Surgeon: [name], MD;  Location: [place] CV LAB;  Service: Cardiovascular;  Laterality: [name];  . AGILE CAPSULE [name] 03/18/2014   Procedure: AGILE CAPSULE;  Surgeon: Danie Binder, MD;  [facility];  Service: Endoscopy;  Laterality: [name];  7:30  . APPLICATION OF WOUND VAC Left 09/03/2017   Procedure: APPLICATION OF WOUND VAC;  Surgeon: Conrad , MD;  Location: [place];  Service: Vascular;  Laterality: Left;  . BACK SURGERY    . BACTERIAL OVERGROWTH TEST [name] 05/24/2015   Procedure: BACTERIAL OVERGROWTH TEST;  Surgeon: Danie Binder, MD;  [facility];  Service: Endoscopy;  Laterality: [name];  0700  . BI-VENTRICULAR IMPLANTABLE CARDIOVERTER DEFIBRILLATOR  (CRT-D)  08/19/2015  . CARPAL TUNNEL RELEASE Left 02/02/2016  Procedure: LEFT CARPAL TUNNEL RELEASE;  Surgeon: Daryll Brod, MD;  Location: [place];  Service: Orthopedics;  Laterality: Left;  ANESTHESIA: IV REGIONAL UPPER ARM  . CATARACT EXTRACTION W/PHACO Right 03/08/2015   Procedure: CATARACT EXTRACTION PHACO AND INTRAOCULAR LENS PLACEMENT (IOC);  Surgeon: Rutherford Guys, MD;  [facility];  Service: Ophthalmology;  Laterality: Right;  CDE:9.46  . CATARACT EXTRACTION W/PHACO Left 03/22/2015   Procedure: CATARACT EXTRACTION PHACO AND INTRAOCULAR LENS PLACEMENT (IOC);  Surgeon: Rutherford Guys, MD;  [facility];   Service: Ophthalmology;  Laterality: Left;  CDE:5.80  . COLONOSCOPY  08/22/09   Fields-(Tubular Adenoma)3-mm transverse polyp/4-mm polyp otherwise noraml/small internal hemorrhoids  . COLONOSCOPY [name] 04/14/2014   JOI:NOMVE internal hemorrhids/normal mocsa in the terminal iluem/left colonis redundant  . COLONOSCOPY W/ POLYPECTOMY  2011  . ENDARTERECTOMY FEMORAL Left 08/16/2017   Procedure: ENDARTERECTOMY LEFT PROFUNDA FEMORAL;  Surgeon: [name], MD;  Location: [place];  Service: Vascular;  Laterality: Left;  . EP IMPLANTABLE DEVICE [name] 08/19/2015   Procedure: BiV ICD Insertion CRT-D;  Surgeon: Evans Lance, MD;  Location: [place] CV LAB;  Service: Cardiovascular;  Laterality: [name];  . ESOPHAGOGASTRODUODENOSCOPY [name] 03/05/2014   SLF: 1. Stricture at the gastroesophagael junction 2. Small hiatal hernia 3. Moderate non-erosive gastritis and duodentitis. 4. No surce for Melena identified.   . FEMORAL-POPLITEAL BYPASS GRAFT Left 08/16/2017   Procedure: LEFT  FEMORAL-POPLITEAL ARTERY  BYPASS GRAFT;  Surgeon: [name], MD;  Location: [place];  Service: Vascular;  Laterality: Left;  . GIVENS CAPSULE STUDY [name] 03/30/2014   Procedure: GIVENS CAPSULE STUDY;  Surgeon: Danie Binder, MD;  [facility];  Service: Endoscopy;  Laterality: [name];  7:30  . I&D EXTREMITY Left 09/03/2017   Procedure: IRRIGATION AND DEBRIDEMENT EXTREMITY LEFT THIGH SEROMA;  Surgeon: Conrad Stoy, MD;  Location[facility];  Service: Vascular;  Laterality: Left;  . IR ANGIO INTRA EXTRACRAN SEL COM CAROTID INNOMINATE BILAT MOD SED  07/16/2017  . IR ANGIO VERTEBRAL SEL SUBCLAVIAN INNOMINATE BILAT MOD SED  07/16/2017  . IR RADIOLOGIST EVAL & MGMT  04/01/2017  . IR RADIOLOGIST EVAL & MGMT  08/02/2017  . JOINT REPLACEMENT Right   . LOWER EXTREMITY ANGIOGRAPHY Bilateral 06/25/2017   Procedure: Lower Extremity Angiography;  Surgeon: [name], MD;  Location: [place] CV LAB;  Service: Cardiovascular;  Laterality:  Bilateral;  . LUMBAR FUSION  2010  . PERIPHERAL VASCULAR ATHERECTOMY Right 04/22/2018   Procedure: PERIPHERAL VASCULAR ATHERECTOMY;  Surgeon: [name], MD;  Location: [place] CV LAB;  Service: Cardiovascular;  Laterality: Right;  right superficial femoral  . PERIPHERAL VASCULAR BALLOON ANGIOPLASTY Right 04/22/2018   Procedure: PERIPHERAL VASCULAR BALLOON ANGIOPLASTY;  Surgeon: [name], MD;  Location: [place] CV LAB;  Service: Cardiovascular;  Laterality: Right;  external iliac  . TOTAL SHOULDER ARTHROPLASTY Right 2011   Dr. Tamera Punt  . ULNAR NERVE TRANSPOSITION Left 02/02/2016   Procedure: LEFT IN-SITU DECOMPRESSION ULNAR NERVE ;  Surgeon: Daryll Brod, MD;  Location: [place];  Service: Orthopedics;  Laterality: Left;  . ULNAR TUNNEL RELEASE Left 02/02/2016   Procedure: LEFT CUBITAL TUNNEL RELEASE;  Surgeon: Daryll Brod, MD;  Location: [place];  Service: Orthopedics;  Laterality: Left;  [name] VASECTOMY  1971  . VEIN HARVEST Left 08/16/2017   Procedure: USING NON REVERSE LEFT GREATER SAPHENOUS VEIN HARVEST;  Surgeon: [name], MD;  Location: [place];  Service: Vascular;  Laterality: Left;    There were no vitals filed for this visit.  Subjective Assessment - 05/23/18 0900    Subjective  Pt reports that he is feeling good this morning.    Pertinent History  R TSA    Limitations  Lifting    How long can you sit comfortably?  no issues    How long can you stand comfortably?  no issues    How long can you walk comfortably?  no issues    Patient Stated Goals  not as much to no pain     Currently in Pain?  No/denies    Pain Onset  More than a month ago         [facility] PT Assessment - 05/23/18 0001      Assessment   Medical Diagnosis  Neck Pain    Referring Provider  Celene Squibb, MD    Onset Date/Surgical Date  02/23/18    Hand Dominance  Right    Next MD Visit  6 months    Prior Therapy  PT for back, R TSA, "all kinds of stuff"       AROM   Cervical Flexion  70 was 66 at eval    Cervical Extension  34 was 22 at eval    Cervical - Right Side Bend  35 was 14 at eval    Cervical - Left Side Bend  30 was 22 at eval    Cervical - Right Rotation  70 was 51 at eval    Cervical - Left Rotation  64 was 61 at eval      Strength   Cervical - Right Side Bend  5/5 was 4+      Palpation   Palpation comment  mild soft tissue restrictions upper trap and levator scap               PT Education - 05/23/18 0900    Education Details  reassessment and discharge    Person(s) Educated  Patient    Methods  Explanation;Demonstration    Comprehension  Verbalized understanding;Returned demonstration       PT Short Term Goals - 05/23/18 0902      PT SHORT TERM GOAL #1   Title  Pt will be independent with HEP and perform consistently in order to decrease pain and improve function.    Time  2    Period  Weeks    Status  Achieved      PT SHORT TERM GOAL #2   Title  Pt will have improved cervical AROM by 5 deg throughout in order to decrease pain and improve overall function.    Time  2    Period  Weeks    Status  Achieved        PT Long Term Goals - 05/23/18 0902      PT LONG TERM GOAL #1   Title  Pt will have improved cervical AROM by 10 deg in cervical extension and bil rotation in order to decrease pain and maximize his ability to drive with greater ease.     Time  4    Period  Weeks    Status  Partially Met      PT LONG TERM GOAL #2   Title  Pt will have at least 4+/5 cervical MMT in order to decrease pain and maximize his ability to perform household and community responsibilities without pain and with greater ease.    Time  4  Period  Weeks    Status  Achieved      PT LONG TERM GOAL #3   Title  Pt will demo mild soft tissue restrictions of R upper trap and R levator scap in order to decrease pain and improve overall cervical ROM.    Time  4    Period  Weeks    Status  Achieved             Plan - 05/23/18 0920    Clinical Impression Statement  PT reassessed pt's goals and outcome measures this date. Pt has made tremendous progress since his eval. His AROM has significantly improved, soft tissue restrictions are minimal, and his pain has significantly improved. Pt is ready for discharge at this point due to progress made. No changes made to HEP.     Rehab Potential  Good    PT Frequency  2x / week    PT Duration  4 weeks    PT Treatment/Interventions  ADLs/Self Care Home Management;Cryotherapy;Electrical Stimulation;Moist Heat;Ultrasound;Functional mobility training;Therapeutic activities;Therapeutic exercise;Neuromuscular re-education;Patient/family education;Manual techniques;Passive range of motion;Dry needling;Energy conservation;Taping    PT Next Visit Plan  discharged    PT Home Exercise Plan  eval: seated upper trap and levator scap stretching on R; 6/7: cervical retractions; 6/14: scap retraction, GH extension, high rows GTB    Consulted and Agree with Plan of Care  Patient       Patient will benefit from skilled therapeutic intervention in order to improve the following deficits and impairments:  Decreased range of motion, Decreased strength, Hypomobility, Increased fascial restricitons, Increased muscle spasms, Impaired flexibility, Impaired UE functional use, Improper body mechanics, Postural dysfunction, Pain  Visit Diagnosis: Cervicalgia  Abnormal posture  Other muscle spasm     Problem List Patient Active Problem List   Diagnosis Date Noted  . Complication of surgical procedure 09/03/2017  . History of alcohol abuse 08/19/2017  . PAD (peripheral artery disease) ([place]) 08/16/2017  . ICD (implantable cardioverter-defibrillator), biventricular, in situ 12/01/2015  . Cardiomyopathy, ischemic 08/20/2015  . Normocytic anemia 10/14/2014  . Weight gain 06/29/2014  . Hypokalemia 06/29/2014  . Chronic systolic heart failure ([place]) 06/15/2014  . COPD  exacerbation ([place]) 06/15/2014  . Hypernatremia 06/15/2014  . Acute renal failure ([place]) 06/15/2014  . Encephalopathy 06/15/2014  . Loose stools 06/15/2014  . Respiratory failure ([place]) 06/03/2014  . Acute respiratory failure ([place]) 06/03/2014  . Pneumonia, organism unspecified(486) 06/03/2014  . Elevated troponin 06/03/2014  . NSTEMI (non-ST elevated myocardial infarction) ([place]) 06/03/2014  . Acute on chronic diastolic heart failure ([place]) 06/03/2014  . Peripheral vascular disease, unspecified ([place]) 04/26/2014  . Melena 03/03/2014  . Cirrhosis, alcoholic ([place]) 73/53/2992  . Alcohol withdrawal delirium ([place]) 10/09/2012  . Colon adenomas 06/18/2012  . Degenerative joint disease   . LBBB (left bundle branch block)   . Tobacco abuse   . Alcohol dependence ([place])   . Thrombocytopenia ([place])   . Hypertensive heart disease   . Anxiety and depression   . Hyperlipidemia 10/03/2010  . CEREBROVASCULAR DISEASE 10/03/2010  . CHRONIC OBSTRUCTIVE PULMONARY DISEASE 10/03/2010  . DEGENERATIVE JOINT DISEASE, SHOULDER 10/02/2007        PHYSICAL THERAPY DISCHARGE SUMMARY  Visits from Start of Care: 9  Current functional level related to goals / functional outcomes: See above   Remaining deficits: See above   Education / Equipment: continue HEP  Plan: Patient agrees to discharge.  Patient goals were met. Patient is being discharged due to meeting the stated rehab goals.  ?????  Geraldine Solar PT, [facility] [address] [place], [place], [address] Phone: [phone]   Fax:  [phone]  Name: Roy [name] MRN: [id number] Date of Birth: [birth year]

## 2018-05-24 IMAGING — US US ABDOMEN LIMITED
1 series · 14 of 25 positions shown · non-contrast
Comparison: 11/02/2015

CLINICAL DATA: Hepatic cirrhosis

EXAM:
US ABDOMEN LIMITED - RIGHT UPPER QUADRANT

[Series 1: us abdomen limited · 0.15mm/px · 14 of 61 slices shown]
[im 1/61]
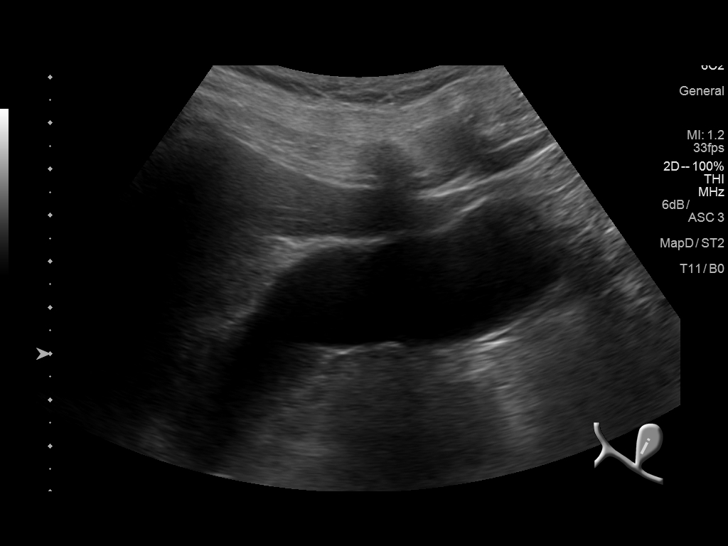
[im 6/61]
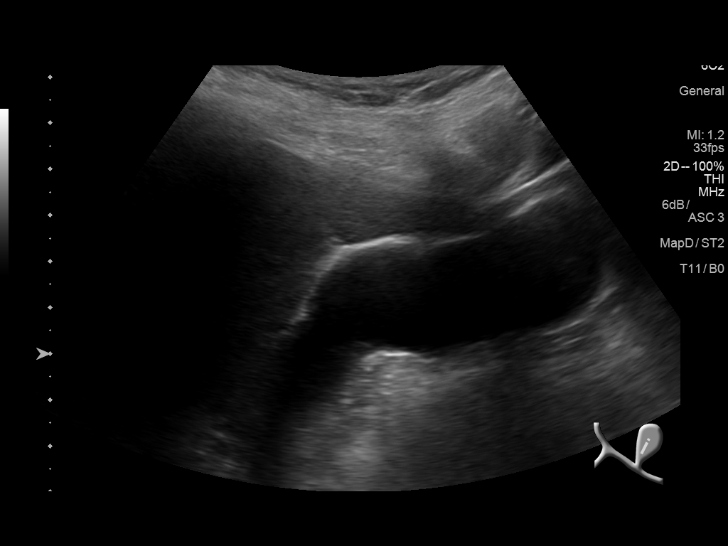
[im 11/61]
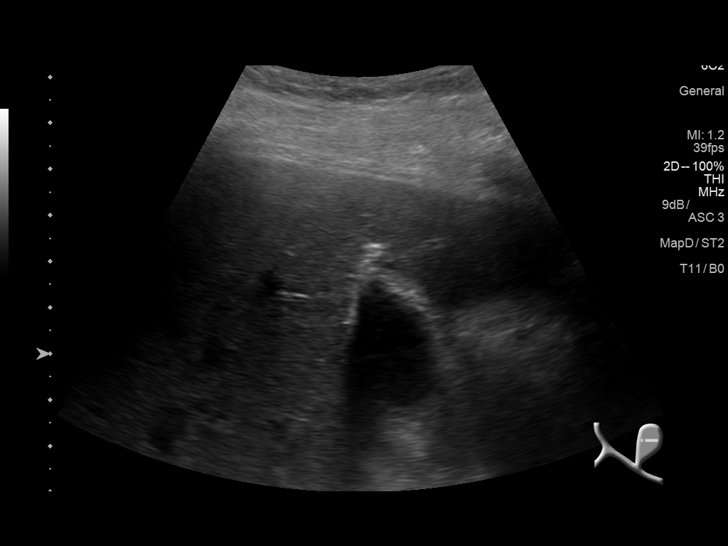
[im 16/61]
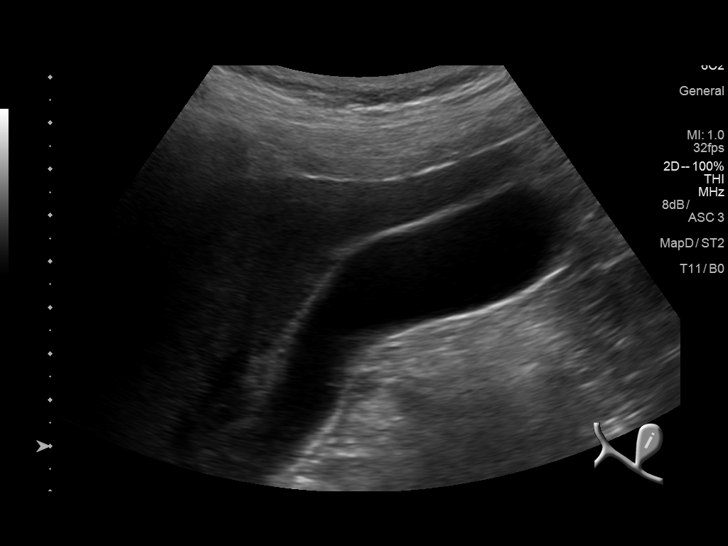
[im 21/61]
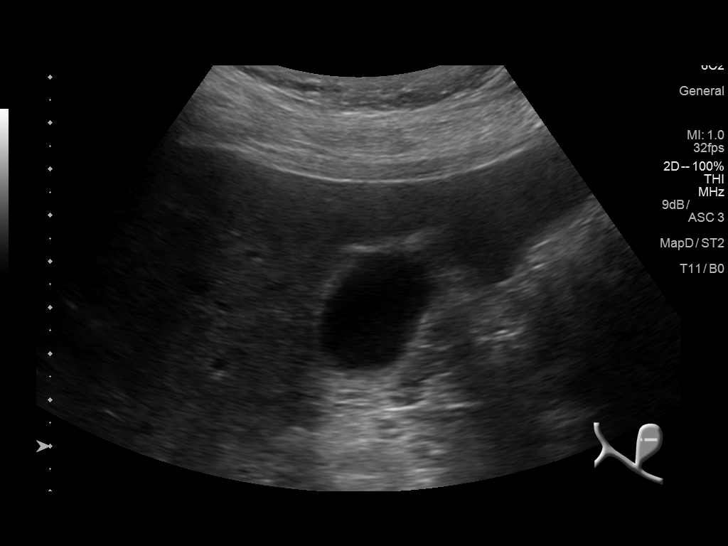
[im 23/61]
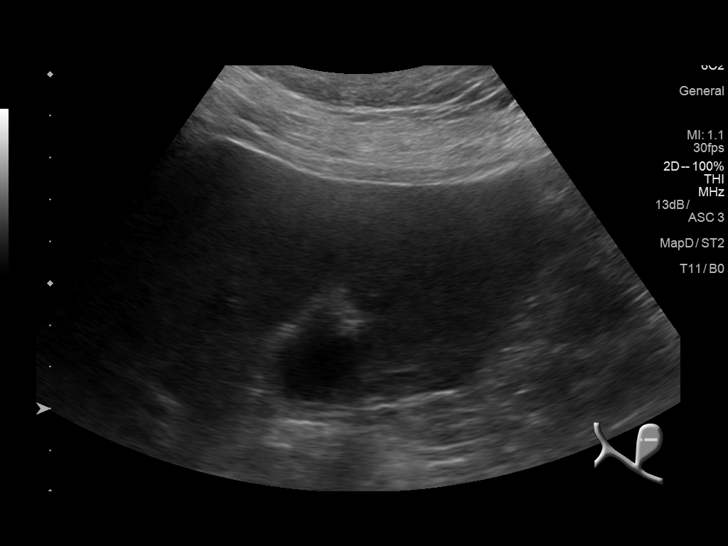
[im 28/61]
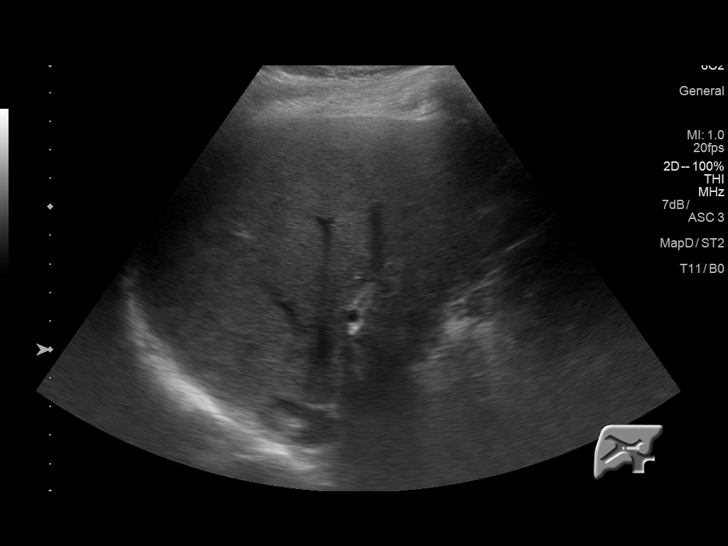
[im 33/61]
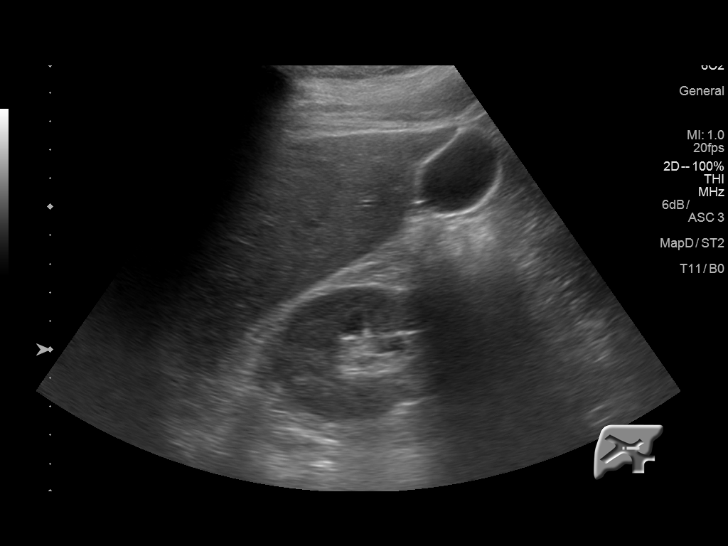
[im 38/61]
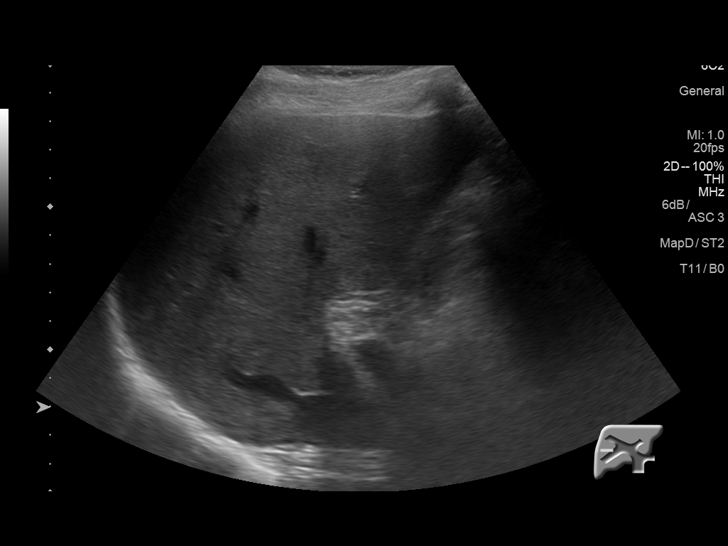
[im 41/61]
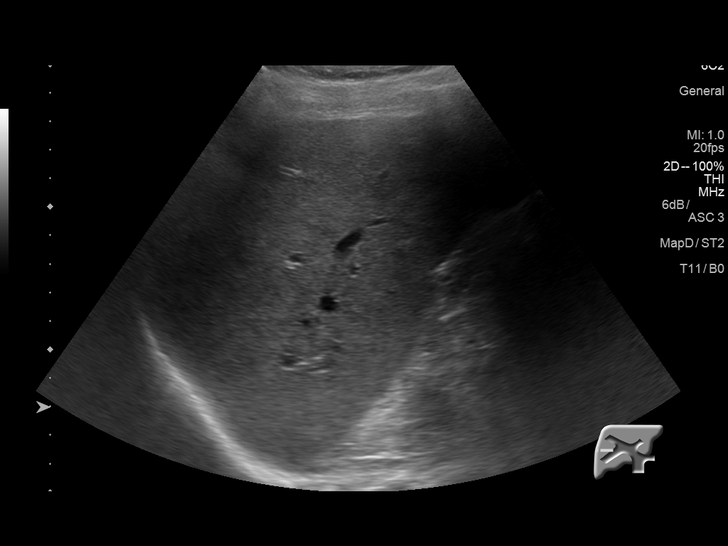
[im 46/61]
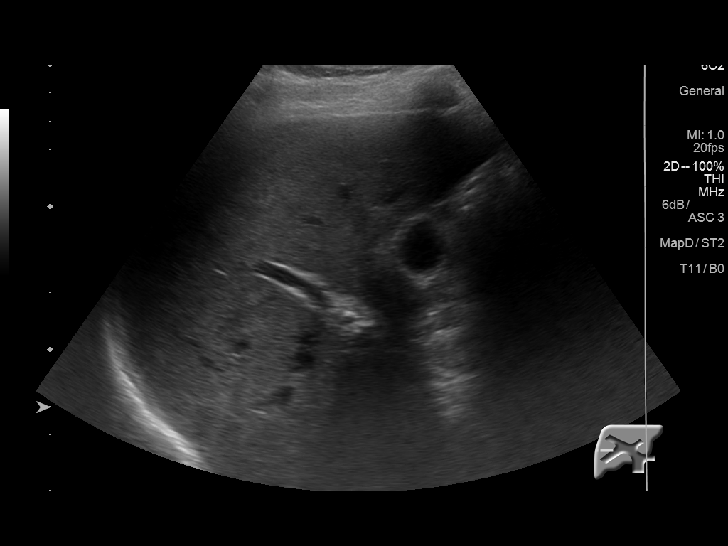
[im 51/61]
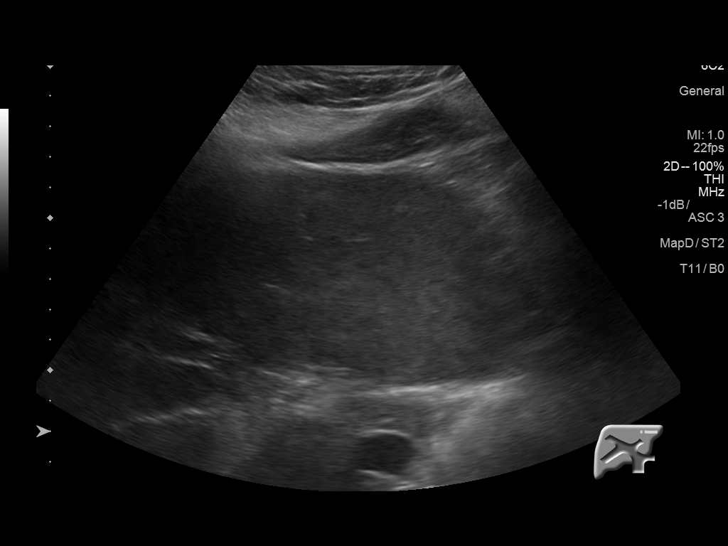
[im 56/61]
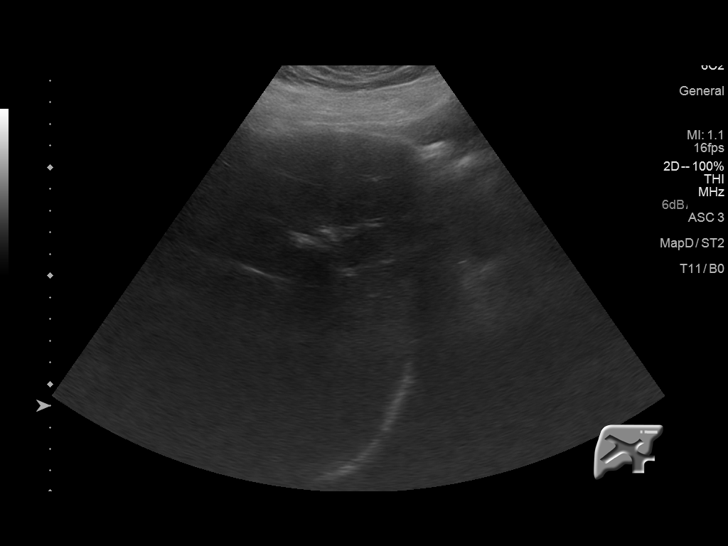
[im 61/61]
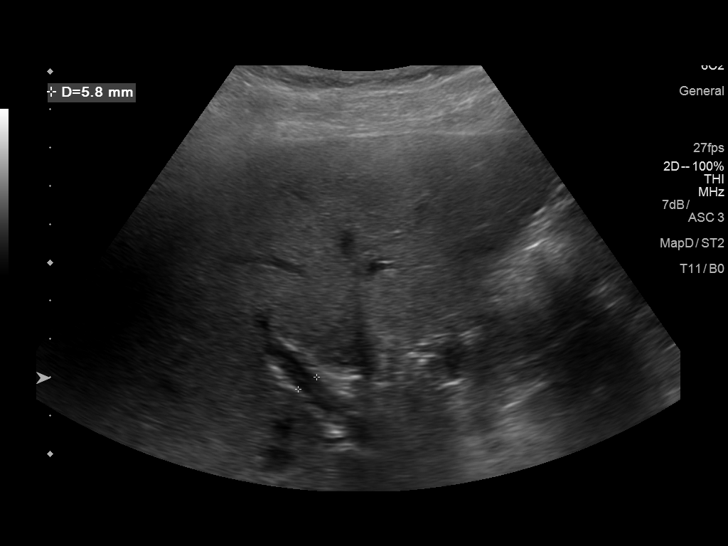

[14 of 25 positions shown; findings below may reference images not displayed]

FINDINGS: Gallbladder:

No gallstones or wall thickening visualized. No sonographic Murphy
sign noted by sonographer.

Common bile duct:

Diameter: 3.4 mm.

Liver:

Mild heterogeneity and diffuse increased echogenicity is noted
similar to that seen on the prior exam. No focal mass lesion is
noted. No significant contour ir regularity is noted.
IMPRESSION: Hepatic heterogeneity without focal mass. No significant interval
change is noted.

## 2018-05-30 ENCOUNTER — Ambulatory Visit: Payer: Medicare Other | Admitting: Family

## 2018-05-30 ENCOUNTER — Encounter (HOSPITAL_COMMUNITY): Payer: Medicare Other

## 2018-06-24 ENCOUNTER — Encounter: Payer: Self-pay | Admitting: Family

## 2018-06-24 ENCOUNTER — Ambulatory Visit (INDEPENDENT_AMBULATORY_CARE_PROVIDER_SITE_OTHER): Payer: Self-pay | Admitting: Family

## 2018-06-24 ENCOUNTER — Other Ambulatory Visit: Payer: Self-pay

## 2018-06-24 ENCOUNTER — Ambulatory Visit (HOSPITAL_COMMUNITY)
Admission: RE | Admit: 2018-06-24 | Discharge: 2018-06-24 | Disposition: A | Discharge status: Home / Self Care | Payer: Medicare Other | Source: Ambulatory Visit | Attending: Family | Admitting: Family

## 2018-06-24 ENCOUNTER — Ambulatory Visit (INDEPENDENT_AMBULATORY_CARE_PROVIDER_SITE_OTHER)
Admission: RE | Admit: 2018-06-24 | Discharge: 2018-06-24 | Disposition: A | Discharge status: Home / Self Care | Payer: Medicare Other | Source: Ambulatory Visit | Attending: Surgery | Admitting: Surgery

## 2018-06-24 VITALS — BP 145/77 | HR 60 | Temp 97.2°F | Resp 16 | Ht 63.0 in | Wt 151.0 lb

## 2018-06-24 DIAGNOSIS — E785 Hyperlipidemia, unspecified: Secondary | ICD-10-CM | POA: Diagnosis not present

## 2018-06-24 DIAGNOSIS — Z95828 Presence of other vascular implants and grafts: Secondary | ICD-10-CM

## 2018-06-24 DIAGNOSIS — I10 Essential (primary) hypertension: Secondary | ICD-10-CM | POA: Diagnosis not present

## 2018-06-24 DIAGNOSIS — I739 Peripheral vascular disease, unspecified: Secondary | ICD-10-CM | POA: Diagnosis not present

## 2018-06-24 DIAGNOSIS — F17200 Nicotine dependence, unspecified, uncomplicated: Secondary | ICD-10-CM

## 2018-06-24 DIAGNOSIS — I70201 Unspecified atherosclerosis of native arteries of extremities, right leg: Secondary | ICD-10-CM | POA: Insufficient documentation

## 2018-06-24 DIAGNOSIS — I70213 Atherosclerosis of native arteries of extremities with intermittent claudication, bilateral legs: Secondary | ICD-10-CM

## 2018-06-24 NOTE — Progress Notes (Signed)
Postoperative Visit   History of Present Illness  Roy [name] is a 72 y.o. male who is s/p atherectomy and drug coated balloon angioplasty of right superficial femoral artery, and angioplasty of right external iliac artery on 04-22-18 by Dr. [name] for right leg claudication.  He had previously undergone bilateral iliac stenting and a left femoral-popliteal bypass graft for claudication.  He had worsening symptoms in his right leg.  Ultrasound identified a stenosis within his right iliac stent.   He returns today for follow up surveillance.   He smokes 1 ppd, started in 1958.  He does not have DM.   He denies claudication sx's in his legs with walking since these last procedures. He can walk as far as he wants without claudication.   He will be seeing the [place] about hearing aids.   The patient notes resolution of lower extremity symptoms.  The patient is able to complete their activities of daily living.     For VQI Use Only  PRE-ADM LIVING: Home  AMB STATUS: Ambulatory   Past Medical History:  Diagnosis Date  . AICD (automatic cardioverter/defibrillator) present 08/19/2015   St Jude BiV ICD for primary prevention by Dr. Lovena Le  . Alcohol abuse    6 beers per day; hospital admission in 2009 for withdrawal symptoms  . Alcoholic cirrhosis ([place])   . Anxiety and depression   . Cerebrovascular disease 2009   TIA; 2009- right ICA stent; re-intervention for restenosis complicated by [facility] w/o sx  . CHF (congestive heart failure) ([place]) 06-02-14  . Chronic obstructive pulmonary disease ([place])   . Degenerative joint disease    Total shoulder arthroplasty-right  . Hyperlipidemia   . Hypertension   . LBBB (left bundle branch block)    Normal echo-2011; stress nuclear in 09/2010--septal hypoperfusion representing nontransmural infarction or the effect of left bundle branch block, no ischemia  . Myocardial infarction [facility]) June 02, 2014   Massive Heart Attack  . Peripheral vascular  disease ([place])   . 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  . Thrombocytopenia ([place])   . Tobacco abuse    -100 pack years; 1.5 packs per day  . Traumatic seroma of thigh ([place])    left  . Tubular adenoma of colon     Past Surgical History:  Procedure Laterality Date  . ABDOMINAL AORTAGRAM [name] 05/04/2014   Procedure: ABDOMINAL Maxcine Ham;  Surgeon: [name], MD;  Location: [facility] CATH LAB;  Service: Cardiovascular;  Laterality: [name];  . ABDOMINAL AORTOGRAM [name] 06/25/2017   Procedure: Abdominal Aortogram;  Surgeon: [name], MD;  Location: [place] CV LAB;  Service: Cardiovascular;  Laterality: [name];  . ABDOMINAL AORTOGRAM W/LOWER EXTREMITY [name] 04/22/2018   Procedure: ABDOMINAL AORTOGRAM W/LOWER EXTREMITY;  Surgeon: [name], MD;  Location: [place] CV LAB;  Service: Cardiovascular;  Laterality: [name];  . AGILE CAPSULE [name] 03/18/2014   Procedure: AGILE CAPSULE;  Surgeon: Danie Binder, MD;  [facility];  Service: Endoscopy;  Laterality: [name];  7:30  . APPLICATION OF WOUND VAC Left 09/03/2017   Procedure: APPLICATION OF WOUND VAC;  Surgeon: Conrad Wetumka, MD;  Location: [place];  Service: Vascular;  Laterality: Left;  . BACK SURGERY    . BACTERIAL OVERGROWTH TEST [name] 05/24/2015   Procedure: BACTERIAL OVERGROWTH TEST;  Surgeon: Danie Binder, MD;  [facility];  Service: Endoscopy;  Laterality: [name];  0700  . BI-VENTRICULAR IMPLANTABLE CARDIOVERTER DEFIBRILLATOR  (CRT-D)  08/19/2015  .  CARPAL TUNNEL RELEASE Left 02/02/2016   Procedure: LEFT CARPAL TUNNEL RELEASE;  Surgeon: Daryll Brod, MD;  Location: [place];  Service: Orthopedics;  Laterality: Left;  ANESTHESIA: IV REGIONAL UPPER ARM  . CATARACT EXTRACTION W/PHACO Right 03/08/2015   Procedure: CATARACT EXTRACTION PHACO AND INTRAOCULAR LENS PLACEMENT (IOC);  Surgeon: Rutherford Guys, MD;  Location: [facility];  Service: Ophthalmology;  Laterality: Right;  CDE:9.46  . CATARACT EXTRACTION  W/PHACO Left 03/22/2015   Procedure: CATARACT EXTRACTION PHACO AND INTRAOCULAR LENS PLACEMENT (IOC);  Surgeon: Rutherford Guys, MD;  Location: [facility];  Service: Ophthalmology;  Laterality: Left;  CDE:5.80  . COLONOSCOPY  08/22/09   Fields-(Tubular Adenoma)3-mm transverse polyp/4-mm polyp otherwise noraml/small internal hemorrhoids  . COLONOSCOPY [name] 04/14/2014   VHQ:IONGE internal hemorrhids/normal mocsa in the terminal iluem/left colonis redundant  . COLONOSCOPY W/ POLYPECTOMY  2011  . ENDARTERECTOMY FEMORAL Left 08/16/2017   Procedure: ENDARTERECTOMY LEFT PROFUNDA FEMORAL;  Surgeon: [name], MD;  Location: [place];  Service: Vascular;  Laterality: Left;  . EP IMPLANTABLE DEVICE [name] 08/19/2015   Procedure: BiV ICD Insertion CRT-D;  Surgeon: Evans Lance, MD;  Location: [place] CV LAB;  Service: Cardiovascular;  Laterality: [name];  . ESOPHAGOGASTRODUODENOSCOPY [name] 03/05/2014   SLF: 1. Stricture at the gastroesophagael junction 2. Small hiatal hernia 3. Moderate non-erosive gastritis and duodentitis. 4. No surce for Melena identified.   . FEMORAL-POPLITEAL BYPASS GRAFT Left 08/16/2017   Procedure: LEFT  FEMORAL-POPLITEAL ARTERY  BYPASS GRAFT;  Surgeon: [name], MD;  Location: [place];  Service: Vascular;  Laterality: Left;  . GIVENS CAPSULE STUDY [name] 03/30/2014   Procedure: GIVENS CAPSULE STUDY;  Surgeon: Danie Binder, MD;  [facility];  Service: Endoscopy;  Laterality: [name];  7:30  . I&D EXTREMITY Left 09/03/2017   Procedure: IRRIGATION AND DEBRIDEMENT EXTREMITY LEFT THIGH SEROMA;  Surgeon: Conrad Harrison, MD;  Location[facility] OR;  Service: Vascular;  Laterality: Left;  . IR ANGIO INTRA EXTRACRAN SEL COM CAROTID INNOMINATE BILAT MOD SED  07/16/2017  . IR ANGIO VERTEBRAL SEL SUBCLAVIAN INNOMINATE BILAT MOD SED  07/16/2017  . IR RADIOLOGIST EVAL & MGMT  04/01/2017  . IR RADIOLOGIST EVAL & MGMT  08/02/2017  . JOINT REPLACEMENT Right   . LOWER EXTREMITY ANGIOGRAPHY Bilateral 06/25/2017    Procedure: Lower Extremity Angiography;  Surgeon: [name], MD;  Location: [place] CV LAB;  Service: Cardiovascular;  Laterality: Bilateral;  . LUMBAR FUSION  2010  . PERIPHERAL VASCULAR ATHERECTOMY Right 04/22/2018   Procedure: PERIPHERAL VASCULAR ATHERECTOMY;  Surgeon: [name], MD;  Location: [place] CV LAB;  Service: Cardiovascular;  Laterality: Right;  right superficial femoral  . PERIPHERAL VASCULAR BALLOON ANGIOPLASTY Right 04/22/2018   Procedure: PERIPHERAL VASCULAR BALLOON ANGIOPLASTY;  Surgeon: [name], MD;  Location: [place] CV LAB;  Service: Cardiovascular;  Laterality: Right;  external iliac  . TOTAL SHOULDER ARTHROPLASTY Right 2011   Dr. Tamera Punt  . ULNAR NERVE TRANSPOSITION Left 02/02/2016   Procedure: LEFT IN-SITU DECOMPRESSION ULNAR NERVE ;  Surgeon: Daryll Brod, MD;  Location: [place];  Service: Orthopedics;  Laterality: Left;  . ULNAR TUNNEL RELEASE Left 02/02/2016   Procedure: LEFT CUBITAL TUNNEL RELEASE;  Surgeon: Daryll Brod, MD;  Location: [place];  Service: Orthopedics;  Laterality: Left;  [name] VASECTOMY  1971  . VEIN HARVEST Left 08/16/2017   Procedure: USING NON REVERSE LEFT GREATER SAPHENOUS VEIN HARVEST;  Surgeon: [name], MD;  Location: [facility] OR;  Service: Vascular;  Laterality: Left;    Family History  Problem Relation Age of Onset  . Coronary artery disease Mother   . Diabetes Mother   . Heart disease Mother        Before age 30 - 59 Bypasses  . Hypertension Mother   . Heart attack Mother        3-4 Heart attacks  . Alzheimer's disease Father   . Diabetes Father   . Heart attack Sister   . Cancer Brother        Prostate  . Hyperlipidemia Son   . Prostate cancer Brother   . Alzheimer's disease Sister   . Colon cancer Neg Hx     Social History   Socioeconomic History  . Marital status: Married    Spouse name: Not on file  . Number of children: 2  . Years of education: Not on file    . Highest education level: Not on file  Occupational History  . Occupation: retired, Clinical biochemist    Comment: Programmer, systems: RETIRED  Social Needs  . Financial resource strain: Not on file  . Food insecurity:    Worry: Not on file    Inability: Not on file  . Transportation needs:    Medical: Not on file    Non-medical: Not on file  Tobacco Use  . Smoking status: Current Every Day Smoker    Packs/day: 1.00    Years: 60.00    Pack years: 60.00    Types: Cigarettes    Start date: 08/20/1957  . Smokeless tobacco: Never Used  Substance and Sexual Activity  . Alcohol use: No    Alcohol/week: 0.0 oz    Comment: quit in July 2015  . Drug use: No  . Sexual activity: Never  Lifestyle  . Physical activity:    Days per week: Not on file    Minutes per session: Not on file  . Stress: Not on file  Relationships  . Social connections:    Talks on phone: Not on file    Gets together: Not on file    Attends religious service: Not on file    Active member of club or organization: Not on file    Attends meetings of clubs or organizations: Not on file    Relationship status: Not on file  . Intimate partner violence:    Fear of current or ex partner: Not on file    Emotionally abused: Not on file    Physically abused: Not on file    Forced sexual activity: Not on file  Other Topics Concern  . Not on file  Social History Narrative   Accompanied by daughter [name]   Lives w/ wife, daughter    No Known Allergies  Current Outpatient Medications on File Prior to Visit  Medication Sig Dispense Refill  . acetaminophen (TYLENOL) 500 MG tablet Take 1,000 mg by mouth daily as needed for moderate pain.     [name] aspirin EC 81 MG tablet Take 81 mg by mouth every evening.     [name] atorvastatin (LIPITOR) 20 MG tablet Take 20 mg by mouth daily.     . carvedilol (COREG) 3.125 MG tablet TAKE 1 TABLET TWICE A DAY (NEED TO CALL AND MAKE AN APPOINTMENT FOR FUTURE REFILLS) 180 tablet 0   . clopidogrel (PLAVIX) 75 MG tablet Take 75 mg by mouth every evening.     [name] dextromethorphan-guaiFENesin (MUCINEX DM) 30-600 MG 12hr  tablet Take 1 tablet by mouth daily.    [name] ENTRESTO 49-51 MG TAKE 1 TABLET TWICE A DAY 409 tablet 3  . folic acid (FOLVITE) 811 MCG tablet Take 800 mcg by mouth daily.    [name] gabapentin (NEURONTIN) 300 MG capsule Take 300 mg by mouth 2 (two) times daily.     . Menthol, Topical Analgesic, (ICY HOT EX) Apply 1 application topically at bedtime.    . methocarbamol (ROBAXIN) 500 MG tablet Take 500 mg by mouth at bedtime.    . Multiple Vitamins-Minerals (CENTRUM SILVER PO) Take 1 tablet by mouth daily.     . pantoprazole (PROTONIX) 40 MG tablet TAKE 1 TABLET DAILY 90 tablet 3  . Potassium 99 MG TABS Take 99 mg by mouth daily.    . SUPER B COMPLEX/C PO Take 1 tablet by mouth daily.    . tamsulosin (FLOMAX) 0.4 MG CAPS capsule Take 0.4 mg by mouth daily.    . Tetrahydrozoline HCl (VISINE OP) Place 1 drop into both eyes daily as needed (dry eyes).     No current facility-administered medications on file prior to visit.      Physical Examination  Vitals:   06/24/18 0922 06/24/18 0924  BP: (!) 146/82 (!) 145/77  Pulse: 60   Resp: 16   Temp: (!) 97.2 F (36.2 C)   TempSrc: Oral   SpO2: 97%   Weight: 151 lb (68.5 kg)   Height: 5\' 3"  (1.6 m)    Body mass index is 26.75 kg/m.  PHYSICAL EXAMINATION: General: The patient appears his stated age.   HEENT:  No gross abnormalities Pulmonary: Respirations are non-labored, distant breath sounds, no rales, rhonchi, or wheezing.  Abdomen: Soft and non-tender with normal bowel sounds. Musculoskeletal: There are no major deformities.   Neurologic: No focal weakness or paresthesias are detected, diminished sensation to touch in left toes. CN 2-12 intact except is hard of hearing.  Skin: There are no ulcer or rashes noted. Psychiatric: The patient has normal affect. Cardiovascular: There is a regular rate and rhythm  without significant murmur appreciated. Pacemaker/AICD palpated left upper chest.   Vascular: Vessel Right Left  Radial 2+Palpable 2+Palpable  Brachial Palpable Palpable  Carotid Palpable, without bruit Palpable, without bruit  Aorta Not palpable [name]  Femoral 2+Palpable 2+Palpable  Popliteal Not palpable Not palpable  PT not Palpable not Palpable  DP 2+ Palpable 1+ Palpable    Medical Decision Making  Roy [name] is a 72 y.o. male who presents s/p atherectomy and drug coated balloon angioplasty of right superficial femoral artery, and angioplasty of right external iliac artery on 04-22-18 by Dr. [name] for right leg claudication.  He had previously undergone bilateral iliac stenting and a left femoral-popliteal bypass graft for claudication. He no longer has claudication.  There are no signs of ischemia in his feet or legs.  Based on his lower extremity arterial duplex studies, physical exam findings, and HPI, this patient needs: ABI's, right aortoiliac duplex, and right LE arterial duplex in 3 months. I advised him to notify us if he develops concerns re the circulation in his feet or legs.  Walk at least 30 minutes daily in a safe environment.  Over 3 minutes was spent counseling patient re smoking cessation, and patient was given several free resources re smoking cessation.   I discussed in depth with the patient the nature of atherosclerosis, and emphasized the importance of maximal medical management including strict control of blood pressure, blood glucose, and lipid  levels, obtaining regular exercise, and cessation of smoking.  The patient is aware that without maximal medical management the underlying atherosclerotic disease process will progress, limiting the benefit of any interventions. The patient is currently on a statin The patient is currently dual anti-platelet therapy.  Thank you for allowing [place] to participate in this patient's care.  Clemon Chambers, RN, MSN,  FNP-C [facility] Office: [phone]  06/24/2018, 9:31 AM  Clinic MD: Early

## 2018-06-24 NOTE — Patient Instructions (Addendum)
Before your next abdominal ultrasound:  Avoid gas forming foods and beverages the day before the test.   Take two Extra-Strength Gas-X capsules at bedtime the night before the test. Take another two Extra-Strength Gas-X capsules in the middle of the night if you get up to the restroom, if not, first thing in the morning with water.  Do not chew gum.       Steps to Quit Smoking Smoking tobacco can be bad for your health. It can also affect almost every organ in your body. Smoking puts you and people around you at risk for many serious long-lasting (chronic) diseases. Quitting smoking is hard, but it is one of the best things that you can do for your health. It is never too late to quit. What are the benefits of quitting smoking? When you quit smoking, you lower your risk for getting serious diseases and conditions. They can include:  Lung cancer or lung disease.  Heart disease.  Stroke.  Heart attack.  Not being able to have children (infertility).  Weak bones (osteoporosis) and broken bones (fractures).  If you have coughing, wheezing, and shortness of breath, those symptoms may get better when you quit. You may also get sick less often. If you are pregnant, quitting smoking can help to lower your chances of having a baby of low birth weight. What can I do to help me quit smoking? Talk with your doctor about what can help you quit smoking. Some things you can do (strategies) include:  Quitting smoking totally, instead of slowly cutting back how much you smoke over a period of time.  Going to in-person counseling. You are more likely to quit if you go to many counseling sessions.  Using resources and support systems, such as: ? Database administrator with a Social worker. ? Phone quitlines. ? Careers information officer. ? Support groups or group counseling. ? Text messaging programs. ? Mobile phone apps or applications.  Taking medicines. Some of these medicines may have nicotine in  them. If you are pregnant or breastfeeding, do not take any medicines to quit smoking unless your doctor says it is okay. Talk with your doctor about counseling or other things that can help you.  Talk with your doctor about using more than one strategy at the same time, such as taking medicines while you are also going to in-person counseling. This can help make quitting easier. What things can I do to make it easier to quit? Quitting smoking might feel very hard at first, but there is a lot that you can do to make it easier. Take these steps:  Talk to your family and friends. Ask them to support and encourage you.  Call phone quitlines, reach out to support groups, or work with a Social worker.  Ask people who smoke to not smoke around you.  Avoid places that make you want (trigger) to smoke, such as: ? Bars. ? Parties. ? Smoke-break areas at work.  Spend time with people who do not smoke.  Lower the stress in your life. Stress can make you want to smoke. Try these things to help your stress: ? Getting regular exercise. ? Deep-breathing exercises. ? Yoga. ? Meditating. ? Doing a body scan. To do this, close your eyes, focus on one area of your body at a time from head to toe, and notice which parts of your body are tense. Try to relax the muscles in those areas.  Download or buy apps on your mobile phone or tablet  that can help you stick to your quit plan. There are many free apps, such as QuitGuide from the [employer] Office manager for Disease Control and Prevention). You can find more support from smokefree.gov and other websites.  This information is not intended to replace advice given to you by your health care provider. Make sure you discuss any questions you have with your health care provider. Document Released: 09/08/2009 Document Revised: 07/10/2016 Document Reviewed: 03/29/2015 Elsevier Interactive Patient Education  2018 [place].     Peripheral Vascular Disease Peripheral  vascular disease (PVD) is a disease of the blood vessels that are not part of your heart and brain. A simple term for PVD is poor circulation. In most cases, PVD narrows the blood vessels that carry blood from your heart to the rest of your body. This can result in a decreased supply of blood to your arms, legs, and internal organs, like your stomach or kidneys. However, it most often affects a person's lower legs and feet. There are two types of PVD.  Organic PVD. This is the more common type. It is caused by damage to the structure of blood vessels.  Functional PVD. This is caused by conditions that make blood vessels contract and tighten (spasm).  Without treatment, PVD tends to get worse over time. PVD can also lead to acute ischemic limb. This is when an arm or limb suddenly has trouble getting enough blood. This is a medical emergency. Follow these instructions at home:  Take medicines only as told by your doctor.  Do not use any tobacco products, including cigarettes, chewing tobacco, or electronic cigarettes. If you need help quitting, ask your doctor.  Lose weight if you are overweight, and maintain a healthy weight as told by your doctor.  Eat a diet that is low in fat and cholesterol. If you need help, ask your doctor.  Exercise regularly. Ask your doctor for some good activities for you.  Take good care of your feet. ? Wear comfortable shoes that fit well. ? Check your feet often for any cuts or sores. Contact a doctor if:  You have cramps in your legs while walking.  You have leg pain when you are at rest.  You have coldness in a leg or foot.  Your skin changes.  You are unable to get or have an erection (erectile dysfunction).  You have cuts or sores on your feet that are not healing. Get help right away if:  Your arm or leg turns cold and blue.  Your arms or legs become red, warm, swollen, painful, or numb.  You have chest pain or trouble breathing.  You  suddenly have weakness in your face, arm, or leg.  You become very confused or you cannot speak.  You suddenly have a very bad headache.  You suddenly cannot see. This information is not intended to replace advice given to you by your health care provider. Make sure you discuss any questions you have with your health care provider. Document Released: 02/06/2010 Document Revised: 04/19/2016 Document Reviewed: 04/22/2014 Elsevier Interactive Patient Education  2017 [employer].

## 2018-06-26 ENCOUNTER — Other Ambulatory Visit: Payer: Self-pay

## 2018-06-26 ENCOUNTER — Other Ambulatory Visit: Payer: Self-pay | Admitting: Gastroenterology

## 2018-06-26 ENCOUNTER — Encounter: Payer: Self-pay | Admitting: Family

## 2018-06-30 ENCOUNTER — Ambulatory Visit (INDEPENDENT_AMBULATORY_CARE_PROVIDER_SITE_OTHER): Payer: Medicare Other | Admitting: *Deleted

## 2018-06-30 DIAGNOSIS — I255 Ischemic cardiomyopathy: Secondary | ICD-10-CM

## 2018-06-30 DIAGNOSIS — I5022 Chronic systolic (congestive) heart failure: Secondary | ICD-10-CM

## 2018-06-30 NOTE — Progress Notes (Signed)
Remote ICD transmission.   

## 2018-07-03 ENCOUNTER — Other Ambulatory Visit: Payer: Self-pay

## 2018-07-03 DIAGNOSIS — Z95828 Presence of other vascular implants and grafts: Secondary | ICD-10-CM

## 2018-07-03 DIAGNOSIS — I70213 Atherosclerosis of native arteries of extremities with intermittent claudication, bilateral legs: Secondary | ICD-10-CM

## 2018-07-15 ENCOUNTER — Encounter: Payer: Self-pay | Admitting: Cardiovascular Disease

## 2018-07-15 ENCOUNTER — Ambulatory Visit (INDEPENDENT_AMBULATORY_CARE_PROVIDER_SITE_OTHER): Payer: Medicare Other | Admitting: Cardiovascular Disease

## 2018-07-15 VITALS — BP 122/72 | HR 72 | Ht 63.0 in | Wt 154.0 lb

## 2018-07-15 DIAGNOSIS — Z95810 Presence of automatic (implantable) cardiac defibrillator: Secondary | ICD-10-CM

## 2018-07-15 DIAGNOSIS — Z720 Tobacco use: Secondary | ICD-10-CM | POA: Diagnosis not present

## 2018-07-15 DIAGNOSIS — E785 Hyperlipidemia, unspecified: Secondary | ICD-10-CM | POA: Diagnosis not present

## 2018-07-15 DIAGNOSIS — I255 Ischemic cardiomyopathy: Secondary | ICD-10-CM | POA: Diagnosis not present

## 2018-07-15 DIAGNOSIS — I739 Peripheral vascular disease, unspecified: Secondary | ICD-10-CM | POA: Diagnosis not present

## 2018-07-15 DIAGNOSIS — I10 Essential (primary) hypertension: Secondary | ICD-10-CM | POA: Diagnosis not present

## 2018-07-15 DIAGNOSIS — I25118 Atherosclerotic heart disease of native coronary artery with other forms of angina pectoris: Secondary | ICD-10-CM | POA: Diagnosis not present

## 2018-07-15 DIAGNOSIS — I5022 Chronic systolic (congestive) heart failure: Secondary | ICD-10-CM

## 2018-07-15 NOTE — Patient Instructions (Signed)
Your physician wants you to follow-up in: with Dr.Koneswaran You will receive a reminder letter in the mail two months in advance. If you don't receive a letter, please call our office to schedule the follow-up appointment.     Your physician recommends that you continue on your current medications as directed. Please refer to the Current Medication list given to you today.   If you need a refill on your cardiac medications before your next appointment, please call your pharmacy.     No labs or tests today.      Thank you for choosing [facility] !

## 2018-07-15 NOTE — Progress Notes (Signed)
SUBJECTIVE: The patient presents for routine follow-up.  He has a history of coronary artery disease and CABG, peripheral arterial disease, and biventricular ICD.  Echocardiogram 08/01/2017 demonstrated normalization of left ventricular systolic function with normal regional wall motion, LVEF 50 to 40%, grade 1 diastolic dysfunction, and mild mitral regurgitation.  Nuclear stress testing on 08/30/2014 demonstrated a large area of inferior myocardial scar with no evidence of ischemia, and inferior akinesis.  The patient denies any symptoms of chest pain, palpitations, shortness of breath, lightheadedness, dizziness, leg swelling, orthopnea, PND, and syncope.  He continues to smoke and has no desire to quit.  He said "it is more mental than physical ".  He said he enjoys smoking.  He has been spending his time working on his 13 [employer].  He put in a 350 cc V8 engine and it comes stock with a 3.8 L V6. 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   Review of Systems: As per "subjective", otherwise negative.  No Known Allergies  Current Outpatient Medications  Medication Sig Dispense Refill  . acetaminophen (TYLENOL) 500 MG tablet Take 1,000 mg by mouth daily as needed for moderate pain.     [name] aspirin EC 81 MG tablet Take 81 mg by mouth every evening.     [name] atorvastatin (LIPITOR) 20 MG tablet Take 20 mg by mouth daily.     . carvedilol (COREG) 3.125 MG tablet TAKE 1 TABLET TWICE A DAY (NEED TO CALL AND MAKE AN APPOINTMENT FOR FUTURE REFILLS) 180 tablet 0  . clopidogrel (PLAVIX) 75 MG tablet Take 75 mg by mouth every evening.     [name] dextromethorphan-guaiFENesin (MUCINEX DM) 30-600 MG 12hr tablet Take 1 tablet by mouth daily.    [name] ENTRESTO 49-51 MG TAKE 1 TABLET TWICE A DAY 981 tablet 3  . folic acid (FOLVITE) 191 MCG tablet Take 800 mcg by mouth daily.    [name] gabapentin (NEURONTIN) 300 MG capsule Take 300 mg by mouth 2 (two) times daily.     . Menthol, Topical Analgesic, (ICY HOT EX)  Apply 1 application topically at bedtime.    . Multiple Vitamins-Minerals (CENTRUM SILVER PO) Take 1 tablet by mouth daily.     . pantoprazole (PROTONIX) 40 MG tablet TAKE 1 TABLET DAILY 90 tablet 4  . Potassium 99 MG TABS Take 99 mg by mouth daily.    . SUPER B COMPLEX/C PO Take 1 tablet by mouth daily.    . tamsulosin (FLOMAX) 0.4 MG CAPS capsule Take 0.4 mg by mouth daily.    . Tetrahydrozoline HCl (VISINE OP) Place 1 drop into both eyes daily as needed (dry eyes).     No current facility-administered medications for this visit.     Past Medical History:  Diagnosis Date  . AICD (automatic cardioverter/defibrillator) present 08/19/2015   St Jude BiV ICD for primary prevention by Dr. Lovena Le  . Alcohol abuse    6 beers per day; hospital admission in 2009 for withdrawal symptoms  . Alcoholic cirrhosis ([place])   . Anxiety and depression   . Cerebrovascular disease 2009   TIA; 2009- right ICA stent; re-intervention for restenosis complicated by [facility] w/o sx  . CHF (congestive heart failure) ([place]) 06-02-14  . Chronic obstructive pulmonary disease ([place])   . Degenerative joint disease    Total shoulder arthroplasty-right  . Hyperlipidemia   . Hypertension   . LBBB (left bundle branch block)    Normal echo-2011; stress nuclear in 09/2010--septal  hypoperfusion representing nontransmural infarction or the effect of left bundle branch block, no ischemia  . Myocardial infarction [facility]) June 02, 2014   Massive Heart Attack  . Peripheral vascular disease ([place])   . 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  . Thrombocytopenia ([place])   . Tobacco abuse    -100 pack years; 1.5 packs per day  . Traumatic seroma of thigh ([place])    left  . Tubular adenoma of colon     Past Surgical History:  Procedure Laterality Date  . ABDOMINAL AORTAGRAM [name] 05/04/2014   Procedure: ABDOMINAL Maxcine Ham;  Surgeon: [name], MD;  Location: [facility] C[facility] LAB;  Service: Cardiovascular;  Laterality: [name];  .  ABDOMINAL AORTOGRAM [name] 06/25/2017   Procedure: Abdominal Aortogram;  Surgeon: [name], MD;  Location: [place] CV LAB;  Service: Cardiovascular;  Laterality: [name];  . ABDOMINAL AORTOGRAM W/LOWER EXTREMITY [name] 04/22/2018   Procedure: ABDOMINAL AORTOGRAM W/LOWER EXTREMITY;  Surgeon: [name], MD;  Location: [place] CV LAB;  Service: Cardiovascular;  Laterality: [name];  . AGILE CAPSULE [name] 03/18/2014   Procedure: AGILE CAPSULE;  Surgeon: Danie Binder, MD;  [facility];  Service: Endoscopy;  Laterality: [name];  7:30  . APPLICATION OF WOUND VAC Left 09/03/2017   Procedure: APPLICATION OF WOUND VAC;  Surgeon: Conrad Schofield, MD;  Location: [place];  Service: Vascular;  Laterality: Left;  . BACK SURGERY    . BACTERIAL OVERGROWTH TEST [name] 05/24/2015   Procedure: BACTERIAL OVERGROWTH TEST;  Surgeon: Danie Binder, MD;  [facility];  Service: Endoscopy;  Laterality: [name];  0700  . BI-VENTRICULAR IMPLANTABLE CARDIOVERTER DEFIBRILLATOR  (CRT-D)  08/19/2015  . CARPAL TUNNEL RELEASE Left 02/02/2016   Procedure: LEFT CARPAL TUNNEL RELEASE;  Surgeon: Daryll Brod, MD;  Location: [place];  Service: Orthopedics;  Laterality: Left;  ANESTHESIA: IV REGIONAL UPPER ARM  . CATARACT EXTRACTION W/PHACO Right 03/08/2015   Procedure: CATARACT EXTRACTION PHACO AND INTRAOCULAR LENS PLACEMENT (IOC);  Surgeon: Rutherford Guys, MD;  Location: [facility];  Service: Ophthalmology;  Laterality: Right;  CDE:9.46  . CATARACT EXTRACTION W/PHACO Left 03/22/2015   Procedure: CATARACT EXTRACTION PHACO AND INTRAOCULAR LENS PLACEMENT (IOC);  Surgeon: Rutherford Guys, MD;  Location: [facility];  Service: Ophthalmology;  Laterality: Left;  CDE:5.80  . COLONOSCOPY  08/22/09   Fields-(Tubular Adenoma)3-mm transverse polyp/4-mm polyp otherwise noraml/small internal hemorrhoids  . COLONOSCOPY [name] 04/14/2014   VHQ:IONGE internal hemorrhids/normal mocsa in the terminal iluem/left colonis redundant  . COLONOSCOPY  W/ POLYPECTOMY  2011  . ENDARTERECTOMY FEMORAL Left 08/16/2017   Procedure: ENDARTERECTOMY LEFT PROFUNDA FEMORAL;  Surgeon: [name], MD;  Location: [place];  Service: Vascular;  Laterality: Left;  . EP IMPLANTABLE DEVICE [name] 08/19/2015   Procedure: BiV ICD Insertion CRT-D;  Surgeon: Evans Lance, MD;  Location: [place] CV LAB;  Service: Cardiovascular;  Laterality: [name];  . ESOPHAGOGASTRODUODENOSCOPY [name] 03/05/2014   SLF: 1. Stricture at the gastroesophagael junction 2. Small hiatal hernia 3. Moderate non-erosive gastritis and duodentitis. 4. No surce for Melena identified.   . FEMORAL-POPLITEAL BYPASS GRAFT Left 08/16/2017   Procedure: LEFT  FEMORAL-POPLITEAL ARTERY  BYPASS GRAFT;  Surgeon: [name], MD;  Location: [place];  Service: Vascular;  Laterality: Left;  . GIVENS CAPSULE STUDY [name] 03/30/2014   Procedure: GIVENS CAPSULE STUDY;  Surgeon: Danie Binder, MD;  [facility];  Service: Endoscopy;  Laterality: [name];  7:30  . I&D EXTREMITY Left 09/03/2017  Procedure: IRRIGATION AND DEBRIDEMENT EXTREMITY LEFT THIGH SEROMA;  Surgeon: Conrad Moorcroft, MD;  Location: [facility] OR;  Service: Vascular;  Laterality: Left;  . IR ANGIO INTRA EXTRACRAN SEL COM CAROTID INNOMINATE BILAT MOD SED  07/16/2017  . IR ANGIO VERTEBRAL SEL SUBCLAVIAN INNOMINATE BILAT MOD SED  07/16/2017  . IR RADIOLOGIST EVAL & MGMT  04/01/2017  . IR RADIOLOGIST EVAL & MGMT  08/02/2017  . JOINT REPLACEMENT Right   . LOWER EXTREMITY ANGIOGRAPHY Bilateral 06/25/2017   Procedure: Lower Extremity Angiography;  Surgeon: [name], MD;  Location: [place] CV LAB;  Service: Cardiovascular;  Laterality: Bilateral;  . LUMBAR FUSION  2010  . PERIPHERAL VASCULAR ATHERECTOMY Right 04/22/2018   Procedure: PERIPHERAL VASCULAR ATHERECTOMY;  Surgeon: [name], MD;  Location: [place] CV LAB;  Service: Cardiovascular;  Laterality: Right;  right superficial femoral  . PERIPHERAL VASCULAR BALLOON ANGIOPLASTY Right  04/22/2018   Procedure: PERIPHERAL VASCULAR BALLOON ANGIOPLASTY;  Surgeon: [name], MD;  Location: [place] CV LAB;  Service: Cardiovascular;  Laterality: Right;  external iliac  . TOTAL SHOULDER ARTHROPLASTY Right 2011   Dr. Tamera Punt  . ULNAR NERVE TRANSPOSITION Left 02/02/2016   Procedure: LEFT IN-SITU DECOMPRESSION ULNAR NERVE ;  Surgeon: Daryll Brod, MD;  Location: [place];  Service: Orthopedics;  Laterality: Left;  . ULNAR TUNNEL RELEASE Left 02/02/2016   Procedure: LEFT CUBITAL TUNNEL RELEASE;  Surgeon: Daryll Brod, MD;  Location: [place];  Service: Orthopedics;  Laterality: Left;  [name] VASECTOMY  1971  . VEIN HARVEST Left 08/16/2017   Procedure: USING NON REVERSE LEFT GREATER SAPHENOUS VEIN HARVEST;  Surgeon: [name], MD;  Location: [facility] OR;  Service: Vascular;  Laterality: Left;    Social History   Socioeconomic History  . Marital status: Married    Spouse name: Not on file  . Number of children: 2  . Years of education: Not on file  . Highest education level: Not on file  Occupational History  . Occupation: retired, Clinical biochemist    Comment: Programmer, systems: RETIRED  Social Needs  . Financial resource strain: Not on file  . Food insecurity:    Worry: Not on file    Inability: Not on file  . Transportation needs:    Medical: Not on file    Non-medical: Not on file  Tobacco Use  . Smoking status: Current Every Day Smoker    Packs/day: 1.00    Years: 60.00    Pack years: 60.00    Types: Cigarettes    Start date: 08/20/1957  . Smokeless tobacco: Never Used  Substance and Sexual Activity  . Alcohol use: No    Alcohol/week: 0.0 standard drinks    Comment: quit in July 2015  . Drug use: No  . Sexual activity: Never  Lifestyle  . Physical activity:    Days per week: Not on file    Minutes per session: Not on file  . Stress: Not on file  Relationships  . Social connections:    Talks on phone: Not on file    Gets  together: Not on file    Attends religious service: Not on file    Active member of club or organization: Not on file    Attends meetings of clubs or organizations: Not on file    Relationship status: Not on file  . Intimate partner violence:    Fear of current or ex partner: Not on file  Emotionally abused: Not on file    Physically abused: Not on file    Forced sexual activity: Not on file  Other Topics Concern  . Not on file  Social History Narrative   Accompanied by daughter [name]   Lives w/ wife, daughter     Vitals:   07/15/18 0808  BP: 122/72  Pulse: 72  SpO2: 98%  Weight: 154 lb (69.9 kg)  Height: 5\' 3"  (1.6 m)    Wt Readings from Last 3 Encounters:  07/15/18 154 lb (69.9 kg)  06/24/18 151 lb (68.5 kg)  04/22/18 160 lb (72.6 kg)     PHYSICAL EXAM General: NAD HEENT: Normal. Neck: No JVD, no thyromegaly. Lungs: Clear to auscultation bilaterally with normal respiratory effort. CV: Regular rate and rhythm, normal S1/S2, no S3/S4, no murmur. No pretibial or periankle edema.  No carotid bruit.   Abdomen: Soft, nontender, no distention.  Neurologic: Alert and oriented.  Psych: Normal affect. Skin: Normal. Musculoskeletal: No gross deformities.    ECG: Reviewed above under Subjective   Labs: Lab Results  Component Value Date/Time   K 3.8 04/22/2018 06:23 AM   K 4.2 04/16/2012 08:07 AM   BUN 12 04/22/2018 06:23 AM   BUN 6 04/16/2012 08:07 AM   CREATININE 0.60 (L) 04/22/2018 06:23 AM   CREATININE 0.71 02/17/2018 08:56 AM   ALT 9 02/17/2018 08:56 AM   TSH 1.650 06/16/2014 12:42 AM   HGB 14.6 04/22/2018 06:23 AM     Lipids: Lab Results  Component Value Date/Time   [facility]  10/18/2010 07:27 PM    35        Total Cholesterol/HDL:CHD Risk Coronary Heart Disease Risk Table                     Men   Women  1/2 Average Risk   3.4   3.3  Average Risk       5.0   4.4  2 X Average Risk   9.6   7.1  3 X Average Risk  23.4   11.0        Use  the calculated Patient Ratio above and the CHD Risk Table to determine the patient's CHD Risk.        ATP III CLASSIFICATION (LDL):  <100     mg/dL   Optimal  100-129  mg/dL   Near or Above                    Optimal  130-159  mg/dL   Borderline  160-189  mg/dL   High  >190     mg/dL   Very High   CHOL 177 01/08/2012 08:09 AM   TRIG 77 06/08/2014 03:31 AM   HDL 53 10/18/2010 07:27 PM       ASSESSMENT AND PLAN: 1.  Coronary artery disease: Stable ischemic heart disease.  Left ventricular systolic function is normal.  Continue aspirin, atorvastatin, and carvedilol.  2.  Chronic systolic heart failure status post biventricular ICD: Continue carvedilol and Entresto.  Left ventricular systolic function normalized as demonstrated by echocardiogram in September 2018 as reviewed above.  3.  Peripheral arterial disease: He is maintained on aspirin, atorvastatin, and clopidogrel.  He has had extensive revascularization.  Unfortunately, he continues to smoke and has no desire to quit.  4.  Hyperlipidemia: Continue atorvastatin 20 mg.  I will obtain a copy of lipids from PCP.  5.  Hypertension: Blood pressure is normal.  No  changes to therapy.  6.  Tobacco abuse: Unfortunately, he continues to smoke and has no desire to quit.    Disposition: Follow up with me in 1 year.  Follow-up with Dr. Lovena Le as scheduled.   Kate Sable, M.D., F.A.C.C.

## 2018-07-24 LAB — CUP PACEART REMOTE DEVICE CHECK
Battery Remaining Percentage: 62 %
Battery Voltage: 2.95 V
Brady Statistic AS VP Percent: 84 %
Brady Statistic RA Percent Paced: 15 %
HIGH POWER IMPEDANCE MEASURED VALUE: 74 Ohm
HighPow Impedance: 74 Ohm
Implantable Lead Implant Date: 20160923
Implantable Lead Location: 753859
Implantable Lead Location: 753860
Implantable Pulse Generator Implant Date: 20160923
Lead Channel Impedance Value: 380 Ohm
Lead Channel Impedance Value: 680 Ohm
Lead Channel Pacing Threshold Amplitude: 0.75 V
Lead Channel Pacing Threshold Amplitude: 0.875 V
Lead Channel Pacing Threshold Amplitude: 1 V
Lead Channel Sensing Intrinsic Amplitude: 12 mV
Lead Channel Setting Pacing Amplitude: 2 V
Lead Channel Setting Pacing Amplitude: 2 V
Lead Channel Setting Pacing Amplitude: 2.25 V
Lead Channel Setting Pacing Pulse Width: 0.5 ms
Lead Channel Setting Pacing Pulse Width: 0.8 ms
Lead Channel Setting Sensing Sensitivity: 0.5 mV
MDC IDC LEAD IMPLANT DT: 20160923
MDC IDC LEAD IMPLANT DT: 20160923
MDC IDC LEAD LOCATION: 753858
MDC IDC MSMT BATTERY REMAINING LONGEVITY: 49 mo
MDC IDC MSMT LEADCHNL LV PACING THRESHOLD PULSEWIDTH: 0.8 ms
MDC IDC MSMT LEADCHNL RA IMPEDANCE VALUE: 480 Ohm
MDC IDC MSMT LEADCHNL RA PACING THRESHOLD PULSEWIDTH: 0.5 ms
MDC IDC MSMT LEADCHNL RA SENSING INTR AMPL: 4.7 mV
MDC IDC MSMT LEADCHNL RV PACING THRESHOLD PULSEWIDTH: 0.5 ms
MDC IDC PG SERIAL: 7294918
MDC IDC SESS DTM: 20190805060026
MDC IDC STAT BRADY AP VP PERCENT: 15 %
MDC IDC STAT BRADY AP VS PERCENT: 1 %
MDC IDC STAT BRADY AS VS PERCENT: 1 %

## 2018-07-29 ENCOUNTER — Other Ambulatory Visit (HOSPITAL_COMMUNITY): Payer: Self-pay | Admitting: Interventional Radiology

## 2018-07-29 ENCOUNTER — Other Ambulatory Visit: Payer: Self-pay | Admitting: Rheumatology

## 2018-07-29 ENCOUNTER — Telehealth (HOSPITAL_COMMUNITY): Payer: Self-pay

## 2018-07-29 DIAGNOSIS — I771 Stricture of artery: Secondary | ICD-10-CM

## 2018-07-29 NOTE — Telephone Encounter (Signed)
Called to schedule f/u cta head/neck, no answer, left vm. AW 

## 2018-08-08 DIAGNOSIS — Z23 Encounter for immunization: Secondary | ICD-10-CM | POA: Diagnosis not present

## 2018-08-12 ENCOUNTER — Ambulatory Visit (HOSPITAL_COMMUNITY): Payer: Medicare Other

## 2018-08-12 ENCOUNTER — Ambulatory Visit (HOSPITAL_COMMUNITY)
Admission: RE | Admit: 2018-08-12 | Discharge: 2018-08-12 | Disposition: A | Discharge status: Home / Self Care | Payer: Medicare Other | Source: Ambulatory Visit | Attending: Interventional Radiology | Admitting: Interventional Radiology

## 2018-08-12 DIAGNOSIS — I771 Stricture of artery: Secondary | ICD-10-CM | POA: Insufficient documentation

## 2018-08-12 DIAGNOSIS — I6522 Occlusion and stenosis of left carotid artery: Secondary | ICD-10-CM | POA: Insufficient documentation

## 2018-08-12 DIAGNOSIS — R9082 White matter disease, unspecified: Secondary | ICD-10-CM | POA: Insufficient documentation

## 2018-08-12 DIAGNOSIS — I6502 Occlusion and stenosis of left vertebral artery: Secondary | ICD-10-CM | POA: Diagnosis not present

## 2018-08-12 DIAGNOSIS — I700 Atherosclerosis of aorta: Secondary | ICD-10-CM | POA: Diagnosis not present

## 2018-08-12 DIAGNOSIS — I63233 Cerebral infarction due to unspecified occlusion or stenosis of bilateral carotid arteries: Secondary | ICD-10-CM | POA: Diagnosis not present

## 2018-08-12 LAB — POCT I-STAT CREATININE: CREATININE: 0.7 mg/dL (ref 0.61–1.24)

## 2018-08-12 MED ORDER — IOPAMIDOL (ISOVUE-370) INJECTION 76%
50.0000 mL | Freq: Once | INTRAVENOUS | Status: AC | PRN
  Administered 2018-08-12: 50 mL via INTRAVENOUS

## 2018-08-12 MED ORDER — IOPAMIDOL (ISOVUE-370) INJECTION 76%
INTRAVENOUS | Status: AC
  Filled 2018-08-12: qty 50

## 2018-08-19 ENCOUNTER — Telehealth (HOSPITAL_COMMUNITY): Payer: Self-pay

## 2018-08-19 NOTE — Telephone Encounter (Signed)
Pt agreed to f/u in 6 months with a catheter angiogram. He will call if he starts to have any sx. AW

## 2018-08-20 ENCOUNTER — Encounter: Payer: Self-pay | Admitting: Nurse Practitioner

## 2018-08-20 ENCOUNTER — Ambulatory Visit (INDEPENDENT_AMBULATORY_CARE_PROVIDER_SITE_OTHER): Payer: Medicare Other | Admitting: Nurse Practitioner

## 2018-08-20 ENCOUNTER — Encounter: Payer: Self-pay | Admitting: *Deleted

## 2018-08-20 ENCOUNTER — Other Ambulatory Visit: Payer: Self-pay | Admitting: *Deleted

## 2018-08-20 VITALS — BP 144/75 | HR 62 | Temp 96.5°F | Ht 63.0 in | Wt 159.6 lb

## 2018-08-20 DIAGNOSIS — K7030 Alcoholic cirrhosis of liver without ascites: Secondary | ICD-10-CM

## 2018-08-20 DIAGNOSIS — R7989 Other specified abnormal findings of blood chemistry: Secondary | ICD-10-CM

## 2018-08-20 DIAGNOSIS — F1011 Alcohol abuse, in remission: Secondary | ICD-10-CM

## 2018-08-20 DIAGNOSIS — Z87898 Personal history of other specified conditions: Secondary | ICD-10-CM | POA: Diagnosis not present

## 2018-08-20 DIAGNOSIS — R945 Abnormal results of liver function studies: Secondary | ICD-10-CM | POA: Diagnosis not present

## 2018-08-20 DIAGNOSIS — I255 Ischemic cardiomyopathy: Secondary | ICD-10-CM

## 2018-08-20 DIAGNOSIS — K700 Alcoholic fatty liver: Secondary | ICD-10-CM

## 2018-08-20 NOTE — Progress Notes (Signed)
cc'd to pcp 

## 2018-08-20 NOTE — Progress Notes (Signed)
Referring Provider: Celene Squibb, MD Primary Care Physician:  Celene Squibb, MD Primary GI:  Dr. Oneida Alar  Chief Complaint  Patient presents with  . Cirrhosis    doing ok    HPI:   Roy [name] is a 72 y.o. male who presents for follow-up on alcoholic cirrhosis.  The patient was last seen in our office 02/17/2018 for the same as well as history of alcohol abuse.  Historically with well compensated disease, no longer using alcohol.  EGD up-to-date 2015 with no varices or portal gastropathy.  Last completed labs dated 08/19/2017 and essentially normal with a meld score of 7.  Right upper quadrant ultrasound at that time found no focal liver lesion.  At his last visit he was doing okay, some lower extremity edema but improved with improved blood flow after femoropopliteal bypass.  Essentially no GI or hepatic complaints.  Recommended continue to abstain from alcohol, updated labs and imaging, follow-up in 6 months.  His labs were completed 02/17/2018 including CBC, CMP, INR, AFP and were essentially normal. MELD 7, Child-Pugh: A.   Right upper quadrant ultrasound completed 02/19/2018 finds no focal liver lesion and within normal limits and parenchymal echogenicity.  Today he states he's doing well. Denies abdominal pain, N/V, hematochezia, melena, unintentional weight loss. Denies yellowing of skin/eyes, darkened urine, acute episodic confusion, generalized pruritis, tremors. Denies chest pain, dyspnea, dizziness, lightheadedness, syncope, near syncope. Denies any other upper or lower GI symptoms.  Past Medical History:  Diagnosis Date  . AICD (automatic cardioverter/defibrillator) present 08/19/2015   St Jude BiV ICD for primary prevention by Dr. Lovena Le  . Alcohol abuse    6 beers per day; hospital admission in 2009 for withdrawal symptoms  . Alcoholic cirrhosis ([place])   . Anxiety and depression   . Cerebrovascular disease 2009   TIA; 2009- right ICA stent; re-intervention for restenosis  complicated by [facility] w/o sx  . CHF (congestive heart failure) ([place]) 06-02-14  . Chronic obstructive pulmonary disease ([place])   . Degenerative joint disease    Total shoulder arthroplasty-right  . Hyperlipidemia   . Hypertension   . LBBB (left bundle branch block)    Normal echo-2011; stress nuclear in 09/2010--septal hypoperfusion representing nontransmural infarction or the effect of left bundle branch block, no ischemia  . Myocardial infarction [facility]) June 02, 2014   Massive Heart Attack  . Peripheral vascular disease ([place])   . 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  . Thrombocytopenia ([place])   . Tobacco abuse    -100 pack years; 1.5 packs per day  . Traumatic seroma of thigh ([place])    left  . Tubular adenoma of colon     Past Surgical History:  Procedure Laterality Date  . ABDOMINAL AORTAGRAM [name] 05/04/2014   Procedure: ABDOMINAL Maxcine Ham;  Surgeon: [name], MD;  Location: [facility] CATH LAB;  Service: Cardiovascular;  Laterality: [name];  . ABDOMINAL AORTOGRAM [name] 06/25/2017   Procedure: Abdominal Aortogram;  Surgeon: [name], MD;  Location: [place] CV LAB;  Service: Cardiovascular;  Laterality: [name];  . ABDOMINAL AORTOGRAM W/LOWER EXTREMITY [name] 04/22/2018   Procedure: ABDOMINAL AORTOGRAM W/LOWER EXTREMITY;  Surgeon: [name], MD;  Location: [place] CV LAB;  Service: Cardiovascular;  Laterality: [name];  . AGILE CAPSULE [name] 03/18/2014   Procedure: AGILE CAPSULE;  Surgeon: Danie Binder, MD;  [facility];  Service: Endoscopy;  Laterality: [name];  7:30  . APPLICATION OF WOUND VAC Left 09/03/2017  Procedure: APPLICATION OF WOUND VAC;  Surgeon: Conrad Neponset, MD;  Location: [place];  Service: Vascular;  Laterality: Left;  . BACK SURGERY    . BACTERIAL OVERGROWTH TEST [name] 05/24/2015   Procedure: BACTERIAL OVERGROWTH TEST;  Surgeon: Danie Binder, MD;  [facility];  Service: Endoscopy;  Laterality: [name];  0700  . BI-VENTRICULAR IMPLANTABLE  CARDIOVERTER DEFIBRILLATOR  (CRT-D)  08/19/2015  . CARPAL TUNNEL RELEASE Left 02/02/2016   Procedure: LEFT CARPAL TUNNEL RELEASE;  Surgeon: Daryll Brod, MD;  Location: [place];  Service: Orthopedics;  Laterality: Left;  ANESTHESIA: IV REGIONAL UPPER ARM  . CATARACT EXTRACTION W/PHACO Right 03/08/2015   Procedure: CATARACT EXTRACTION PHACO AND INTRAOCULAR LENS PLACEMENT (IOC);  Surgeon: Rutherford Guys, MD;  Location: [facility] ORS;  Service: Ophthalmology;  Laterality: Right;  CDE:9.46  . CATARACT EXTRACTION W/PHACO Left 03/22/2015   Procedure: CATARACT EXTRACTION PHACO AND INTRAOCULAR LENS PLACEMENT (IOC);  Surgeon: Rutherford Guys, MD;  Location: [facility];  Service: Ophthalmology;  Laterality: Left;  CDE:5.80  . COLONOSCOPY  08/22/09   Fields-(Tubular Adenoma)3-mm transverse polyp/4-mm polyp otherwise noraml/small internal hemorrhoids  . COLONOSCOPY [name] 04/14/2014   SWN:IOEVO internal hemorrhids/normal mocsa in the terminal iluem/left colonis redundant  . COLONOSCOPY W/ POLYPECTOMY  2011  . ENDARTERECTOMY FEMORAL Left 08/16/2017   Procedure: ENDARTERECTOMY LEFT PROFUNDA FEMORAL;  Surgeon: [name], MD;  Location: [place];  Service: Vascular;  Laterality: Left;  . EP IMPLANTABLE DEVICE [name] 08/19/2015   Procedure: BiV ICD Insertion CRT-D;  Surgeon: Evans Lance, MD;  Location: [place] CV LAB;  Service: Cardiovascular;  Laterality: [name];  . ESOPHAGOGASTRODUODENOSCOPY [name] 03/05/2014   SLF: 1. Stricture at the gastroesophagael junction 2. Small hiatal hernia 3. Moderate non-erosive gastritis and duodentitis. 4. No surce for Melena identified.   . FEMORAL-POPLITEAL BYPASS GRAFT Left 08/16/2017   Procedure: LEFT  FEMORAL-POPLITEAL ARTERY  BYPASS GRAFT;  Surgeon: [name], MD;  Location: [place];  Service: Vascular;  Laterality: Left;  . GIVENS CAPSULE STUDY [name] 03/30/2014   Procedure: GIVENS CAPSULE STUDY;  Surgeon: Danie Binder, MD;  [facility];  Service: Endoscopy;  Laterality:  [name];  7:30  . I&D EXTREMITY Left 09/03/2017   Procedure: IRRIGATION AND DEBRIDEMENT EXTREMITY LEFT THIGH SEROMA;  Surgeon: Conrad Clarks Green, MD;  [facility] OR;  Service: Vascular;  Laterality: Left;  . IR ANGIO INTRA EXTRACRAN SEL COM CAROTID INNOMINATE BILAT MOD SED  07/16/2017  . IR ANGIO VERTEBRAL SEL SUBCLAVIAN INNOMINATE BILAT MOD SED  07/16/2017  . IR RADIOLOGIST EVAL & MGMT  04/01/2017  . IR RADIOLOGIST EVAL & MGMT  08/02/2017  . JOINT REPLACEMENT Right   . LOWER EXTREMITY ANGIOGRAPHY Bilateral 06/25/2017   Procedure: Lower Extremity Angiography;  Surgeon: [name], MD;  Location: [place] CV LAB;  Service: Cardiovascular;  Laterality: Bilateral;  . LUMBAR FUSION  2010  . PERIPHERAL VASCULAR ATHERECTOMY Right 04/22/2018   Procedure: PERIPHERAL VASCULAR ATHERECTOMY;  Surgeon: [name], MD;  Location: [place] CV LAB;  Service: Cardiovascular;  Laterality: Right;  right superficial femoral  . PERIPHERAL VASCULAR BALLOON ANGIOPLASTY Right 04/22/2018   Procedure: PERIPHERAL VASCULAR BALLOON ANGIOPLASTY;  Surgeon: [name], MD;  Location: [place] CV LAB;  Service: Cardiovascular;  Laterality: Right;  external iliac  . TOTAL SHOULDER ARTHROPLASTY Right 2011   Dr. Tamera Punt  . ULNAR NERVE TRANSPOSITION Left 02/02/2016   Procedure: LEFT IN-SITU DECOMPRESSION ULNAR NERVE ;  Surgeon: Daryll Brod, MD;  Location: [place];  Service: Orthopedics;  Laterality: Left;  . ULNAR TUNNEL RELEASE Left 02/02/2016   Procedure: LEFT CUBITAL TUNNEL RELEASE;  Surgeon: Daryll Brod, MD;  Location: [place];  Service: Orthopedics;  Laterality: Left;  [name] VASECTOMY  1971  . VEIN HARVEST Left 08/16/2017   Procedure: USING NON REVERSE LEFT GREATER SAPHENOUS VEIN HARVEST;  Surgeon: [name], MD;  [facility] OR;  Service: Vascular;  Laterality: Left;    Current Outpatient Medications  Medication Sig Dispense Refill  . acetaminophen (TYLENOL) 500 MG tablet  Take 1,000 mg by mouth daily as needed for moderate pain.     [name] aspirin EC 81 MG tablet Take 81 mg by mouth every evening.     [name] atorvastatin (LIPITOR) 20 MG tablet Take 20 mg by mouth daily.     . carvedilol (COREG) 3.125 MG tablet TAKE 1 TABLET TWICE A DAY (NEED TO CALL AND MAKE AN APPOINTMENT FOR FUTURE REFILLS) 180 tablet 0  . clopidogrel (PLAVIX) 75 MG tablet Take 75 mg by mouth every evening.     [name] dextromethorphan-guaiFENesin (MUCINEX DM) 30-600 MG 12hr tablet Take 1 tablet by mouth daily.    [name] ENTRESTO 49-51 MG TAKE 1 TABLET TWICE A DAY 427 tablet 3  . folic acid (FOLVITE) 062 MCG tablet Take 800 mcg by mouth daily.    [name] gabapentin (NEURONTIN) 300 MG capsule Take 300 mg by mouth 2 (two) times daily.     . Multiple Vitamins-Minerals (CENTRUM SILVER PO) Take 1 tablet by mouth daily.     . pantoprazole (PROTONIX) 40 MG tablet TAKE 1 TABLET DAILY 90 tablet 4  . Potassium 99 MG TABS Take 99 mg by mouth daily.    . SUPER B COMPLEX/C PO Take 1 tablet by mouth daily.    . tamsulosin (FLOMAX) 0.4 MG CAPS capsule Take 0.4 mg by mouth daily.    . Tetrahydrozoline HCl (VISINE OP) Place 1 drop into both eyes daily as needed (dry eyes).     No current facility-administered medications for this visit.     Allergies as of 08/20/2018  . (No Known Allergies)    Family History  Problem Relation Age of Onset  . Coronary artery disease Mother   . Diabetes Mother   . Heart disease Mother        Before age 94 - 76 Bypasses  . Hypertension Mother   . Heart attack Mother        3-4 Heart attacks  . Alzheimer's disease Father   . Diabetes Father   . Heart attack Sister   . Cancer Brother        Prostate  . Hyperlipidemia Son   . Prostate cancer Brother   . Alzheimer's disease Sister   . Colon cancer Neg Hx     Social History   Socioeconomic History  . Marital status: Married    Spouse name: Not on file  . Number of children: 2  . Years of education: Not on file  . Highest education  level: Not on file  Occupational History  . Occupation: retired, Clinical biochemist    Comment: Programmer, systems: RETIRED  Social Needs  . Financial resource strain: Not on file  . Food insecurity:    Worry: Not on file    Inability: Not on file  . Transportation needs:    Medical: Not on file    Non-medical: Not on file  Tobacco Use  . Smoking  status: Current Every Day Smoker    Packs/day: 1.00    Years: 60.00    Pack years: 60.00    Types: Cigarettes    Start date: 08/20/1957  . Smokeless tobacco: Never Used  Substance and Sexual Activity  . Alcohol use: No    Alcohol/week: 0.0 standard drinks    Comment: quit in July 2015  . Drug use: No  . Sexual activity: Never  Lifestyle  . Physical activity:    Days per week: Not on file    Minutes per session: Not on file  . Stress: Not on file  Relationships  . Social connections:    Talks on phone: Not on file    Gets together: Not on file    Attends religious service: Not on file    Active member of club or organization: Not on file    Attends meetings of clubs or organizations: Not on file    Relationship status: Not on file  Other Topics Concern  . Not on file  Social History Narrative   Accompanied by daughter [name]   Lives w/ wife, daughter    Review of Systems: Complete ROS negative except as per HPI.   Physical Exam: BP (!) 144/75   Pulse 62   Temp (!) 96.5 F (35.8 C) (Oral)   Ht 5\' 3"  (1.6 m)   Wt 159 lb 9.6 oz (72.4 kg)   BMI 28.27 kg/m  General:   Alert and oriented. Pleasant and cooperative. Well-nourished and well-developed.  Head:  Normocephalic and atraumatic. Eyes:  Without icterus, sclera clear and conjunctiva pink.  Ears:  Normal auditory acuity. Cardiovascular:  S1, S2 present without murmurs appreciated. Extremities without clubbing or edema. Respiratory:  Clear to auscultation bilaterally. No wheezes, rales, or rhonchi. No distress.  Gastrointestinal:  +BS, soft, non-tender and  non-distended. No HSM noted. No guarding or rebound. No masses appreciated.  Rectal:  Deferred  Musculoskalatal:  Symmetrical without gross deformities. Neurologic:  Alert and oriented x4;  grossly normal neurologically. Psych:  Alert and cooperative. Normal mood and affect. Heme/Lymph/Immune: No excessive bruising noted.    08/20/2018 9:49 AM   Disclaimer: This note was dictated with voice recognition software. Similar sounding words can inadvertently be transcribed and may not be corrected upon review.

## 2018-08-20 NOTE — Assessment & Plan Note (Signed)
The patient has done well abstaining from alcohol use.  No alcohol since approximately 2015.  Recommend he continue to abstain from alcohol, follow-up in 6 months.

## 2018-08-20 NOTE — Assessment & Plan Note (Signed)
The patient was initially diagnosed with alcoholic cirrhosis.  We have been seeing him every 6 months for updated labs and imaging.  His last 2 imaging studies show normal parenchymal echogenicity and his labs have been completely normal.  He has abstained from alcohol since 2015.  Query possibility of liver regeneration and repair with multiple years without alcohol.  I will recheck his labs today.  I will check a right upper quadrant ultrasound elastography to evaluate for degree of ongoing fibrosis/cirrhosis.  Follow-up in 6 months to discuss further recommendations.

## 2018-08-20 NOTE — Patient Instructions (Signed)
1. We will help schedule your ultrasound for you. 2. Have your labs drawn when you are able to. 3. Return for follow-up in 6 months. 4. Call us if you have any questions or concerns.  At [facility] we value your feedback. You may receive a survey about your visit today. Please share your experience as we strive to create trusting relationships with our patients to provide genuine, compassionate, quality care.  We appreciate your understanding and patience as we review any laboratory studies, imaging, and other diagnostic tests that are ordered as we care for you. Our office policy is 5 business days for review of these results, and any emergent or urgent results are addressed in a timely manner for your best interest. If you do not hear from our office in 1 week, please contact us.   We also encourage the use of MyChart, which contains your medical information for your review as well. If you are not enrolled in this feature, an access code is on this after visit summary for your convenience. Thank you for allowing [place] to be involved in your care.  It was great to seeyou today!  I hope you have a great Fall!!

## 2018-08-21 LAB — CBC WITH DIFFERENTIAL/PLATELET
BASOS ABS: 48 {cells}/uL (ref 0–200)
Basophils Relative: 0.7 %
EOS ABS: 442 {cells}/uL (ref 15–500)
Eosinophils Relative: 6.4 %
HEMATOCRIT: 40.9 % (ref 38.5–50.0)
HEMOGLOBIN: 14.2 g/dL (ref 13.2–17.1)
LYMPHS ABS: 1884 {cells}/uL (ref 850–3900)
MCH: 31.2 pg (ref 27.0–33.0)
MCHC: 34.7 g/dL (ref 32.0–36.0)
MCV: 89.9 fL (ref 80.0–100.0)
MONOS PCT: 9.9 %
MPV: 11.7 fL (ref 7.5–12.5)
NEUTROS ABS: 3843 {cells}/uL (ref 1500–7800)
Neutrophils Relative %: 55.7 %
Platelets: 252 10*3/uL (ref 140–400)
RBC: 4.55 10*6/uL (ref 4.20–5.80)
RDW: 12.1 % (ref 11.0–15.0)
Total Lymphocyte: 27.3 %
WBC: 6.9 10*3/uL (ref 3.8–10.8)
WBCMIX: 683 {cells}/uL (ref 200–950)

## 2018-08-21 LAB — COMPREHENSIVE METABOLIC PANEL
AG RATIO: 2 (calc) (ref 1.0–2.5)
ALT: 11 U/L (ref 9–46)
AST: 16 U/L (ref 10–35)
Albumin: 4.5 g/dL (ref 3.6–5.1)
Alkaline phosphatase (APISO): 93 U/L (ref 40–115)
BILIRUBIN TOTAL: 0.4 mg/dL (ref 0.2–1.2)
BUN / CREAT RATIO: 12 (calc) (ref 6–22)
BUN: 8 mg/dL (ref 7–25)
CALCIUM: 9.3 mg/dL (ref 8.6–10.3)
CO2: 31 mmol/L (ref 20–32)
Chloride: 100 mmol/L (ref 98–110)
Creat: 0.67 mg/dL — ABNORMAL LOW (ref 0.70–1.18)
GLOBULIN: 2.3 g/dL (ref 1.9–3.7)
GLUCOSE: 67 mg/dL (ref 65–139)
Potassium: 4 mmol/L (ref 3.5–5.3)
SODIUM: 137 mmol/L (ref 135–146)
Total Protein: 6.8 g/dL (ref 6.1–8.1)

## 2018-08-21 LAB — PROTIME-INR
INR: 1
Prothrombin Time: 10.7 s (ref 9.0–11.5)

## 2018-08-21 LAB — AFP TUMOR MARKER: AFP-Tumor Marker: 1.6 ng/mL (ref <6.1)

## 2018-08-22 DIAGNOSIS — Z6828 Body mass index (BMI) 28.0-28.9, adult: Secondary | ICD-10-CM | POA: Diagnosis not present

## 2018-08-22 DIAGNOSIS — Z0000 Encounter for general adult medical examination without abnormal findings: Secondary | ICD-10-CM | POA: Diagnosis not present

## 2018-08-25 ENCOUNTER — Ambulatory Visit (HOSPITAL_COMMUNITY)
Admission: RE | Admit: 2018-08-25 | Discharge: 2018-08-25 | Disposition: A | Discharge status: Home / Self Care | Payer: Medicare Other | Source: Ambulatory Visit | Attending: Nurse Practitioner | Admitting: Nurse Practitioner

## 2018-08-25 DIAGNOSIS — K700 Alcoholic fatty liver: Secondary | ICD-10-CM | POA: Diagnosis not present

## 2018-08-25 DIAGNOSIS — R7989 Other specified abnormal findings of blood chemistry: Secondary | ICD-10-CM

## 2018-08-25 DIAGNOSIS — R945 Abnormal results of liver function studies: Secondary | ICD-10-CM | POA: Diagnosis present

## 2018-08-25 DIAGNOSIS — K7030 Alcoholic cirrhosis of liver without ascites: Secondary | ICD-10-CM | POA: Insufficient documentation

## 2018-08-25 DIAGNOSIS — F1011 Alcohol abuse, in remission: Secondary | ICD-10-CM | POA: Diagnosis not present

## 2018-09-11 DIAGNOSIS — Z6829 Body mass index (BMI) 29.0-29.9, adult: Secondary | ICD-10-CM | POA: Diagnosis not present

## 2018-09-11 DIAGNOSIS — H918X3 Other specified hearing loss, bilateral: Secondary | ICD-10-CM | POA: Diagnosis not present

## 2018-09-18 ENCOUNTER — Ambulatory Visit (HOSPITAL_COMMUNITY)
Admission: RE | Admit: 2018-09-18 | Discharge: 2018-09-18 | Disposition: A | Discharge status: Home / Self Care | Payer: Medicare Other | Source: Ambulatory Visit | Attending: Surgery | Admitting: Surgery

## 2018-09-18 ENCOUNTER — Ambulatory Visit (INDEPENDENT_AMBULATORY_CARE_PROVIDER_SITE_OTHER)
Admission: RE | Admit: 2018-09-18 | Discharge: 2018-09-18 | Disposition: A | Discharge status: Home / Self Care | Payer: Medicare Other | Source: Ambulatory Visit | Attending: Surgery | Admitting: Surgery

## 2018-09-18 DIAGNOSIS — Z48812 Encounter for surgical aftercare following surgery on the circulatory system: Secondary | ICD-10-CM

## 2018-09-18 DIAGNOSIS — I70213 Atherosclerosis of native arteries of extremities with intermittent claudication, bilateral legs: Secondary | ICD-10-CM

## 2018-09-18 DIAGNOSIS — Z95828 Presence of other vascular implants and grafts: Secondary | ICD-10-CM

## 2018-09-19 ENCOUNTER — Ambulatory Visit (INDEPENDENT_AMBULATORY_CARE_PROVIDER_SITE_OTHER): Payer: Medicare Other | Admitting: Family

## 2018-09-19 ENCOUNTER — Ambulatory Visit (HOSPITAL_COMMUNITY)
Admission: RE | Admit: 2018-09-19 | Discharge: 2018-09-19 | Disposition: A | Discharge status: Home / Self Care | Payer: Medicare Other | Source: Ambulatory Visit | Attending: Family | Admitting: Family

## 2018-09-19 ENCOUNTER — Encounter: Payer: Self-pay | Admitting: Family

## 2018-09-19 VITALS — BP 133/74 | HR 60 | Temp 97.4°F | Resp 16 | Ht 63.0 in | Wt 156.8 lb

## 2018-09-19 DIAGNOSIS — I779 Disorder of arteries and arterioles, unspecified: Secondary | ICD-10-CM

## 2018-09-19 DIAGNOSIS — F17200 Nicotine dependence, unspecified, uncomplicated: Secondary | ICD-10-CM | POA: Diagnosis not present

## 2018-09-19 DIAGNOSIS — I70213 Atherosclerosis of native arteries of extremities with intermittent claudication, bilateral legs: Secondary | ICD-10-CM | POA: Insufficient documentation

## 2018-09-19 DIAGNOSIS — Z95828 Presence of other vascular implants and grafts: Secondary | ICD-10-CM | POA: Diagnosis not present

## 2018-09-19 NOTE — Progress Notes (Signed)
[facility] [place] HISTORY AND PHYSICAL   CC: Follow up peripheral artery occlusive disease   History of Present Illness:   Roy [name] is a 72 y.o. male who is s/p atherectomy and drug coated balloon angioplasty of right superficial femoral artery, and angioplasty of right external iliac artery on 04-22-18 by Dr. [name] for right leg claudication.  He had previously undergone bilateral iliac stenting and a left femoral-popliteal bypass graft for claudication. He had worsening symptoms in his right leg. Ultrasound identified a stenosis within his right iliac stent.   He returns today for follow up surveillance.   He denies claudication type sx's in his legs with walking since these last procedures. He can walk as far as he wants without claudication.  He only has claudication when walking up an incline.  He walks a great deal, cares for his property.   He is seeing the [place] about hearing aids.    Diabetic: No Tobacco use: smoker  (1 ppd, started in 1958)  Pt meds include: Statin :Yes Betablocker: Yes ASA: Yes Other anticoagulants/antiplatelets: Plavix  Current Outpatient Medications  Medication Sig Dispense Refill  . acetaminophen (TYLENOL) 500 MG tablet Take 1,000 mg by mouth daily as needed for moderate pain.     [name] aspirin EC 81 MG tablet Take 81 mg by mouth every evening.     [name] atorvastatin (LIPITOR) 20 MG tablet Take 20 mg by mouth daily.     . carvedilol (COREG) 3.125 MG tablet TAKE 1 TABLET TWICE A DAY (NEED TO CALL AND MAKE AN APPOINTMENT FOR FUTURE REFILLS) 180 tablet 0  . clopidogrel (PLAVIX) 75 MG tablet Take 75 mg by mouth every evening.     [name] dextromethorphan-guaiFENesin (MUCINEX DM) 30-600 MG 12hr tablet Take 1 tablet by mouth daily.    [name] ENTRESTO 49-51 MG TAKE 1 TABLET TWICE A DAY 536 tablet 3  . folic acid (FOLVITE) 644 MCG tablet Take 800 mcg by mouth daily.    [name] gabapentin (NEURONTIN) 300 MG capsule Take 300 mg by mouth 2 (two)  times daily.     . Multiple Vitamins-Minerals (CENTRUM SILVER PO) Take 1 tablet by mouth daily.     . pantoprazole (PROTONIX) 40 MG tablet TAKE 1 TABLET DAILY 90 tablet 4  . Potassium 99 MG TABS Take 99 mg by mouth daily.    . SUPER B COMPLEX/C PO Take 1 tablet by mouth daily.    . tamsulosin (FLOMAX) 0.4 MG CAPS capsule Take 0.4 mg by mouth daily.    . Tetrahydrozoline HCl (VISINE OP) Place 1 drop into both eyes daily as needed (dry eyes).     No current facility-administered medications for this visit.     Past Medical History:  Diagnosis Date  . AICD (automatic cardioverter/defibrillator) present 08/19/2015   St Jude BiV ICD for primary prevention by Dr. Lovena Le  . Alcohol abuse    6 beers per day; hospital admission in 2009 for withdrawal symptoms  . Alcoholic cirrhosis ([place])   . Anxiety and depression   . Cerebrovascular disease 2009   TIA; 2009- right ICA stent; re-intervention for restenosis complicated by [facility] w/o sx  . CHF (congestive heart failure) ([place]) 06-02-14  . Chronic obstructive pulmonary disease ([place])   . Degenerative joint disease    Total shoulder arthroplasty-right  . Hyperlipidemia   . Hypertension   . LBBB (left bundle branch block)    Normal echo-2011; stress nuclear in 09/2010--septal hypoperfusion representing nontransmural infarction or the  effect of left bundle branch block, no ischemia  . Myocardial infarction [facility]) June 02, 2014   Massive Heart Attack  . Peripheral vascular disease ([place])   . 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  . Thrombocytopenia ([place])   . Tobacco abuse    -100 pack years; 1.5 packs per day  . Traumatic seroma of thigh ([place])    left  . Tubular adenoma of colon     Social History Social History   Tobacco Use  . Smoking status: Current Every Day Smoker    Packs/day: 1.00    Years: 60.00    Pack years: 60.00    Types: Cigarettes    Start date: 08/20/1957  . Smokeless tobacco: Never Used  Substance Use Topics  .  Alcohol use: No    Alcohol/week: 0.0 standard drinks    Comment: quit in July 2015  . Drug use: No    Family History Family History  Problem Relation Age of Onset  . Coronary artery disease Mother   . Diabetes Mother   . Heart disease Mother        Before age 56 - 67 Bypasses  . Hypertension Mother   . Heart attack Mother        3-4 Heart attacks  . Alzheimer's disease Father   . Diabetes Father   . Heart attack Sister   . Cancer Brother        Prostate  . Hyperlipidemia Son   . Prostate cancer Brother   . Alzheimer's disease Sister   . Colon cancer Neg Hx     Surgical History Past Surgical History:  Procedure Laterality Date  . ABDOMINAL AORTAGRAM [name] 05/04/2014   Procedure: ABDOMINAL Maxcine Ham;  Surgeon: [name], MD;  Location: [facility] CATH LAB;  Service: Cardiovascular;  Laterality: [name];  . ABDOMINAL AORTOGRAM [name] 06/25/2017   Procedure: Abdominal Aortogram;  Surgeon: [name], MD;  Location: [place] CV LAB;  Service: Cardiovascular;  Laterality: [name];  . ABDOMINAL AORTOGRAM W/LOWER EXTREMITY [name] 04/22/2018   Procedure: ABDOMINAL AORTOGRAM W/LOWER EXTREMITY;  Surgeon: [name], MD;  Location: [place] CV LAB;  Service: Cardiovascular;  Laterality: [name];  . AGILE CAPSULE [name] 03/18/2014   Procedure: AGILE CAPSULE;  Surgeon: Danie Binder, MD;  [facility];  Service: Endoscopy;  Laterality: [name];  7:30  . APPLICATION OF WOUND VAC Left 09/03/2017   Procedure: APPLICATION OF WOUND VAC;  Surgeon: Conrad Glenolden, MD;  Location: [place];  Service: Vascular;  Laterality: Left;  . BACK SURGERY    . BACTERIAL OVERGROWTH TEST [name] 05/24/2015   Procedure: BACTERIAL OVERGROWTH TEST;  Surgeon: Danie Binder, MD;  [facility];  Service: Endoscopy;  Laterality: [name];  0700  . BI-VENTRICULAR IMPLANTABLE CARDIOVERTER DEFIBRILLATOR  (CRT-D)  08/19/2015  . CARPAL TUNNEL RELEASE Left 02/02/2016   Procedure: LEFT CARPAL TUNNEL RELEASE;  Surgeon: Daryll Brod,  MD;  Location: [place];  Service: Orthopedics;  Laterality: Left;  ANESTHESIA: IV REGIONAL UPPER ARM  . CATARACT EXTRACTION W/PHACO Right 03/08/2015   Procedure: CATARACT EXTRACTION PHACO AND INTRAOCULAR LENS PLACEMENT (IOC);  Surgeon: Rutherford Guys, MD;  Location: [facility];  Service: Ophthalmology;  Laterality: Right;  CDE:9.46  . CATARACT EXTRACTION W/PHACO Left 03/22/2015   Procedure: CATARACT EXTRACTION PHACO AND INTRAOCULAR LENS PLACEMENT (IOC);  Surgeon: Rutherford Guys, MD;  Location: [facility];  Service: Ophthalmology;  Laterality: Left;  CDE:5.80  . COLONOSCOPY  08/22/09   Fields-(Tubular Adenoma)3-mm transverse  polyp/4-mm polyp otherwise noraml/small internal hemorrhoids  . COLONOSCOPY [name] 04/14/2014   YWV:PXTGG internal hemorrhids/normal mocsa in the terminal iluem/left colonis redundant  . COLONOSCOPY W/ POLYPECTOMY  2011  . ENDARTERECTOMY FEMORAL Left 08/16/2017   Procedure: ENDARTERECTOMY LEFT PROFUNDA FEMORAL;  Surgeon: [name], MD;  Location: [place];  Service: Vascular;  Laterality: Left;  . EP IMPLANTABLE DEVICE [name] 08/19/2015   Procedure: BiV ICD Insertion CRT-D;  Surgeon: Evans Lance, MD;  Location: [place] CV LAB;  Service: Cardiovascular;  Laterality: [name];  . ESOPHAGOGASTRODUODENOSCOPY [name] 03/05/2014   SLF: 1. Stricture at the gastroesophagael junction 2. Small hiatal hernia 3. Moderate non-erosive gastritis and duodentitis. 4. No surce for Melena identified.   . FEMORAL-POPLITEAL BYPASS GRAFT Left 08/16/2017   Procedure: LEFT  FEMORAL-POPLITEAL ARTERY  BYPASS GRAFT;  Surgeon: [name], MD;  Location: [place];  Service: Vascular;  Laterality: Left;  . GIVENS CAPSULE STUDY [name] 03/30/2014   Procedure: GIVENS CAPSULE STUDY;  Surgeon: Danie Binder, MD;  [facility];  Service: Endoscopy;  Laterality: [name];  7:30  . I&D EXTREMITY Left 09/03/2017   Procedure: IRRIGATION AND DEBRIDEMENT EXTREMITY LEFT THIGH SEROMA;  Surgeon: Conrad Clarkston, MD;   Location[facility] OR;  Service: Vascular;  Laterality: Left;  . IR ANGIO INTRA EXTRACRAN SEL COM CAROTID INNOMINATE BILAT MOD SED  07/16/2017  . IR ANGIO VERTEBRAL SEL SUBCLAVIAN INNOMINATE BILAT MOD SED  07/16/2017  . IR RADIOLOGIST EVAL & MGMT  04/01/2017  . IR RADIOLOGIST EVAL & MGMT  08/02/2017  . JOINT REPLACEMENT Right   . LOWER EXTREMITY ANGIOGRAPHY Bilateral 06/25/2017   Procedure: Lower Extremity Angiography;  Surgeon: [name], MD;  Location: [place] CV LAB;  Service: Cardiovascular;  Laterality: Bilateral;  . LUMBAR FUSION  2010  . PERIPHERAL VASCULAR ATHERECTOMY Right 04/22/2018   Procedure: PERIPHERAL VASCULAR ATHERECTOMY;  Surgeon: [name], MD;  Location: [place] CV LAB;  Service: Cardiovascular;  Laterality: Right;  right superficial femoral  . PERIPHERAL VASCULAR BALLOON ANGIOPLASTY Right 04/22/2018   Procedure: PERIPHERAL VASCULAR BALLOON ANGIOPLASTY;  Surgeon: [name], MD;  Location: [place] CV LAB;  Service: Cardiovascular;  Laterality: Right;  external iliac  . TOTAL SHOULDER ARTHROPLASTY Right 2011   Dr. Tamera Punt  . ULNAR NERVE TRANSPOSITION Left 02/02/2016   Procedure: LEFT IN-SITU DECOMPRESSION ULNAR NERVE ;  Surgeon: Daryll Brod, MD;  Location: [place];  Service: Orthopedics;  Laterality: Left;  . ULNAR TUNNEL RELEASE Left 02/02/2016   Procedure: LEFT CUBITAL TUNNEL RELEASE;  Surgeon: Daryll Brod, MD;  Location: [place];  Service: Orthopedics;  Laterality: Left;  [name] VASECTOMY  1971  . VEIN HARVEST Left 08/16/2017   Procedure: USING NON REVERSE LEFT GREATER SAPHENOUS VEIN HARVEST;  Surgeon: [name], MD;  Location: [place];  Service: Vascular;  Laterality: Left;    No Known Allergies  Current Outpatient Medications  Medication Sig Dispense Refill  . acetaminophen (TYLENOL) 500 MG tablet Take 1,000 mg by mouth daily as needed for moderate pain.     [name] aspirin EC 81 MG tablet Take 81 mg by mouth every evening.      [name] atorvastatin (LIPITOR) 20 MG tablet Take 20 mg by mouth daily.     . carvedilol (COREG) 3.125 MG tablet TAKE 1 TABLET TWICE A DAY (NEED TO CALL AND MAKE AN APPOINTMENT FOR FUTURE REFILLS) 180 tablet 0  . clopidogrel (PLAVIX) 75 MG tablet Take 75 mg by  mouth every evening.     [name] dextromethorphan-guaiFENesin (MUCINEX DM) 30-600 MG 12hr tablet Take 1 tablet by mouth daily.    [name] ENTRESTO 49-51 MG TAKE 1 TABLET TWICE A DAY 841 tablet 3  . folic acid (FOLVITE) 660 MCG tablet Take 800 mcg by mouth daily.    [name] gabapentin (NEURONTIN) 300 MG capsule Take 300 mg by mouth 2 (two) times daily.     . Multiple Vitamins-Minerals (CENTRUM SILVER PO) Take 1 tablet by mouth daily.     . pantoprazole (PROTONIX) 40 MG tablet TAKE 1 TABLET DAILY 90 tablet 4  . Potassium 99 MG TABS Take 99 mg by mouth daily.    . SUPER B COMPLEX/C PO Take 1 tablet by mouth daily.    . tamsulosin (FLOMAX) 0.4 MG CAPS capsule Take 0.4 mg by mouth daily.    . Tetrahydrozoline HCl (VISINE OP) Place 1 drop into both eyes daily as needed (dry eyes).     No current facility-administered medications for this visit.      REVIEW OF SYSTEMS: See HPI for pertinent positives and negatives.  Physical Examination Vitals:   09/19/18 1131 09/19/18 1207  BP: (!) 141/76 133/74  Pulse: 60   Resp: 16   Temp: (!) 97.4 F (36.3 C)   TempSrc: Oral   SpO2: 96%   Weight: 156 lb 12.8 oz (71.1 kg)   Height: 5\' 3"  (1.6 m)    Body mass index is 27.78 kg/m.  General:  The patient appears his stated age.   HEENT:  No gross abnormalities Pulmonary: Respirations are non-labored, distant breath sounds, no rales, rhonchi, or wheezing.  Abdomen: Soft and non-tender with normal bowel sounds. Musculoskeletal: There are no major deformities.   Neurologic: No focal weakness or paresthesias are detected, diminished sensation to touch in left toes. CN 2-12 intact except is hard of hearing.  Skin: There are no ulcer or rashes noted. Psychiatric: The  patient has normal affect. Cardiovascular: There is a regular rate and rhythm without significant murmur appreciated. Pacemaker/AICD palpated left upper chest.   Vascular: Vessel Right Left  Radial 2+Palpable 2+Palpable  Brachial Palpable Palpable  Carotid Palpable, without bruit Palpable, without bruit  Aorta Not palpable [name]  Femoral 2+Palpable 2+Palpable  Popliteal Not palpable Not palpable  PT not Palpable not Palpable  DP 2+ Palpable 1+ Palpable      ASSESSMENT:  Roy [name] is a 72 y.o. male who is s/p atherectomy and drug coated balloon angioplasty of right superficial femoral artery, and angioplasty of right external iliac artery on 04-22-18 by Dr. [name] for right leg claudication.  He had previously undergone bilateral iliac stenting and a left femoral-popliteal bypass graft for claudication. He no longer has claudication.  There are no signs of ischemia in his feet or legs.    DATA  Aortoiliac Duplex (09-18-18): Right Stent(s): +---------------+---+---------------+----------+------+ Prox to Stent 122        monophasicbrisk  +---------------+---+---------------+----------+------+ Proximal Stent 116        monophasicbrisk  +---------------+---+---------------+----------+------+ Mid Stent   186        biphasic plaque +---------------+---+---------------+----------+------+ Distal Stent  155        biphasic     +---------------+---+---------------+----------+------+ Distal to Stent35050-99% stenosisbiphasic     +---------------+---+---------------+----------+------+ Right CFA 83 cm/s biphasic waveform broad.   Left Stent(s): +---------------+---+---------------+--------+----------------+ Proximal Stent 142        biphasic         +---------------+---+---------------+--------+----------------+ Mid Stent   217  biphasic          +---------------+---+---------------+--------+----------------+ Distal Stent  28350-99% stenosisbiphasiccurve       +---------------+---+---------------+--------+----------------+ Distal to Stent42250-99% stenosisbiphasicplaque narrowing +---------------+---+---------------+--------+----------------+ Left CFA 223 cm/s Monophasic  Summary: RIGHT STENT: The right external iliac artery stent appears patent with no focal stenosis visualized. Increased velocity distal to the stent in the native artery suggestive of 50-99% stenosis range.  LEFT STENT: The left external iliac artery stent appears patent with increased velocity in the distal stent and distal to the stent in the 50 - 99% range.  Technically difficult exam due to inability to delineare stent walls.    ABI (Date: 09-18-18):  R:   ABI: 0.89 (was 0.97 on 06-24-18),   PT: bi  DP: bi  TBI:  0.84, toe pressure 107, (was 0.89)  L:   ABI: 0.98 (was 0.95),   PT: bi  DP: bi  TBI: 0.72, toe pressure 92, (was 0.81) Slight decline in right ABI, slight improvement in left ABI; mild disease in the right, normal in the left. All biphasic waveforms.   Right LE Arterial Duplex (09-19-18): Highest velocity is 333 cm/s at the proximal SFA. Biphasic waveforms at the proximal CFA, monophasic from DFA to distal peroneal.    PLAN:   Based on today's exam and non-invasive vascular lab results, and after discussing with Dr. Donzetta Matters the distal iliac stent stent stenoses velocities,pt remains asymptomatic. the patient will follow up in 1-2 months to see Dr. [name], discuss options to address these currently asymptomatic stenoses distal to bilateral iliac artery stents and in right SFA.    Walk at least 30 minutes daily total, in a safe environment.   Over 3 minutes was spent counseling patient re smoking cessation, and patient was given several free resources re smoking cessation.  I discussed in depth with the  patient the nature of atherosclerosis, and emphasized the importance of maximal medical management including strict control of blood pressure, blood glucose, and lipid levels, obtaining regular exercise, and cessation of smoking.  The patient is aware that without maximal medical management the underlying atherosclerotic disease process will progress, limiting the benefit of any interventions.  The patient was given information about stroke prevention and what symptoms should prompt the patient to seek immediate medical care.  The patient was given information about PAD including signs, symptoms, treatment, what symptoms should prompt the patient to seek immediate medical care, and risk reduction measures to take.  Thank you for allowing [place] to participate in this patient's care.  Clemon Chambers, RN, MSN, FNP-C [facility] Office: [phone]  Clinic MD: Donzetta Matters 09/19/2018 12:15 PM

## 2018-09-19 NOTE — Progress Notes (Signed)
Pt. Is here for 3 months follow-up.

## 2018-09-29 ENCOUNTER — Ambulatory Visit (INDEPENDENT_AMBULATORY_CARE_PROVIDER_SITE_OTHER): Payer: Medicare Other | Admitting: *Deleted

## 2018-09-29 DIAGNOSIS — I5022 Chronic systolic (congestive) heart failure: Secondary | ICD-10-CM | POA: Diagnosis not present

## 2018-09-29 DIAGNOSIS — I255 Ischemic cardiomyopathy: Secondary | ICD-10-CM

## 2018-09-29 NOTE — Progress Notes (Signed)
Remote ICD transmission.   

## 2018-09-30 ENCOUNTER — Encounter (HOSPITAL_COMMUNITY): Payer: Medicare Other

## 2018-09-30 ENCOUNTER — Ambulatory Visit: Payer: Medicare Other | Admitting: Family

## 2018-10-02 ENCOUNTER — Encounter: Payer: Self-pay | Admitting: Cardiology

## 2018-10-08 DIAGNOSIS — R7301 Impaired fasting glucose: Secondary | ICD-10-CM | POA: Diagnosis not present

## 2018-10-08 DIAGNOSIS — D509 Iron deficiency anemia, unspecified: Secondary | ICD-10-CM | POA: Diagnosis not present

## 2018-10-08 DIAGNOSIS — I10 Essential (primary) hypertension: Secondary | ICD-10-CM | POA: Diagnosis not present

## 2018-10-08 DIAGNOSIS — E782 Mixed hyperlipidemia: Secondary | ICD-10-CM | POA: Diagnosis not present

## 2018-10-13 DIAGNOSIS — R7301 Impaired fasting glucose: Secondary | ICD-10-CM | POA: Diagnosis not present

## 2018-10-13 DIAGNOSIS — I5022 Chronic systolic (congestive) heart failure: Secondary | ICD-10-CM | POA: Diagnosis not present

## 2018-10-13 DIAGNOSIS — M545 Low back pain: Secondary | ICD-10-CM | POA: Diagnosis not present

## 2018-10-13 DIAGNOSIS — I739 Peripheral vascular disease, unspecified: Secondary | ICD-10-CM | POA: Diagnosis not present

## 2018-10-13 DIAGNOSIS — I10 Essential (primary) hypertension: Secondary | ICD-10-CM | POA: Diagnosis not present

## 2018-10-13 DIAGNOSIS — D509 Iron deficiency anemia, unspecified: Secondary | ICD-10-CM | POA: Diagnosis not present

## 2018-10-13 DIAGNOSIS — E782 Mixed hyperlipidemia: Secondary | ICD-10-CM | POA: Diagnosis not present

## 2018-10-13 DIAGNOSIS — Z720 Tobacco use: Secondary | ICD-10-CM | POA: Diagnosis not present

## 2018-10-13 DIAGNOSIS — G8929 Other chronic pain: Secondary | ICD-10-CM | POA: Diagnosis not present

## 2018-10-13 DIAGNOSIS — K7030 Alcoholic cirrhosis of liver without ascites: Secondary | ICD-10-CM | POA: Diagnosis not present

## 2018-10-14 ENCOUNTER — Other Ambulatory Visit: Payer: Self-pay

## 2018-10-26 ENCOUNTER — Other Ambulatory Visit: Payer: Self-pay | Admitting: Cardiovascular Disease

## 2018-10-26 ENCOUNTER — Other Ambulatory Visit: Payer: Self-pay | Admitting: Internal Medicine

## 2018-10-27 ENCOUNTER — Encounter: Payer: Self-pay | Admitting: Surgery

## 2018-10-27 ENCOUNTER — Other Ambulatory Visit: Payer: Self-pay

## 2018-10-27 ENCOUNTER — Ambulatory Visit (INDEPENDENT_AMBULATORY_CARE_PROVIDER_SITE_OTHER): Payer: Medicare Other | Admitting: Surgery

## 2018-10-27 VITALS — BP 144/76 | HR 74 | Temp 97.7°F | Resp 18 | Ht 63.0 in | Wt 162.0 lb

## 2018-10-27 DIAGNOSIS — I70213 Atherosclerosis of native arteries of extremities with intermittent claudication, bilateral legs: Secondary | ICD-10-CM

## 2018-10-27 DIAGNOSIS — I779 Disorder of arteries and arterioles, unspecified: Secondary | ICD-10-CM

## 2018-10-27 NOTE — Progress Notes (Signed)
[facility] [place]  Patient name: Roy [name] MRN: [id number] DOB: [birth year] Sex: male   REASON FOR VISIT:    Follow up  HISOTRY OF PRESENT ILLNESS:   This is a 72 year old gentleman who returns today for follow-up of his claudication symptoms. He is status post bilateral iliac stenting in 2015. This was done for lifestyle limiting claudication. An 8 x 40 self extending stent was placed on the left and 8 x 80 on the right.  He developed progressive symptoms and on 08/16/2017 he underwent a left femoral to above-knee popliteal artery bypass graft with ipsilateral non-reversed greater saphenous vein as well as the extensive profunda femoral endarterectomy. He did well initially however developed a fluid collection around the distal incision, and went back for I and D on 09/03/2017 with wound VAC placement.  He began having significant right leg symptoms and on 04/22/2018 he underwent atherectomy and drug-coated balloon angioplasty of the right superficial femoral artery as well as angioplasty of the right external iliac artery.  He was seen in October by Vinnie Level and found to have elevated velocities in the distal stent on the left and in the native artery on the right.  He was asymptomatic.  He is back today for follow-up.  He continues to not have any significant symptoms.  The patient has a significant smoking history with no interest of stopping. He has a history of stroke as well as a intracranial carotid stent. He has a history of significant alcohol consumption with hospitalization for withdrawal. He is medically managed for hypertension. He is on a statin for hypercholesterolemia. He is on dual agent antiplatelet therapy with aspirin and Plavix.   PAST MEDICAL HISTORY:   Past Medical History:  Diagnosis Date  . AICD (automatic cardioverter/defibrillator) present 08/19/2015   St Jude BiV ICD for primary prevention  by Dr. Lovena Le  . Alcohol abuse    6 beers per day; hospital admission in 2009 for withdrawal symptoms  . Alcoholic cirrhosis ([place])   . Anxiety and depression   . Cerebrovascular disease 2009   TIA; 2009- right ICA stent; re-intervention for restenosis complicated by [facility] w/o sx  . CHF (congestive heart failure) ([place]) 06-02-14  . Chronic obstructive pulmonary disease ([place])   . Degenerative joint disease    Total shoulder arthroplasty-right  . Hyperlipidemia   . Hypertension   . LBBB (left bundle branch block)    Normal echo-2011; stress nuclear in 09/2010--septal hypoperfusion representing nontransmural infarction or the effect of left bundle branch block, no ischemia  . Myocardial infarction [facility]) June 02, 2014   Massive Heart Attack  . Peripheral vascular disease ([place])   . 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  . Thrombocytopenia ([place])   . Tobacco abuse    -100 pack years; 1.5 packs per day  . Traumatic seroma of thigh ([place])    left  . Tubular adenoma of colon      FAMILY HISTORY:   Family History  Problem Relation Age of Onset  . Coronary artery disease Mother   . Diabetes Mother   . Heart disease Mother        Before age 76 - 24 Bypasses  . Hypertension Mother   . Heart attack Mother        3-4 Heart attacks  . Alzheimer's disease Father   . Diabetes Father   . Heart attack Sister   . Cancer Brother        Prostate  . Hyperlipidemia  Son   . Prostate cancer Brother   . Alzheimer's disease Sister   . Colon cancer Neg Hx     SOCIAL HISTORY:   Social History   Tobacco Use  . Smoking status: Current Every Day Smoker    Packs/day: 1.00    Years: 60.00    Pack years: 60.00    Types: Cigarettes    Start date: 08/20/1957  . Smokeless tobacco: Never Used  Substance Use Topics  . Alcohol use: No    Alcohol/week: 0.0 standard drinks    Comment: quit in July 2015     ALLERGIES:   No Known Allergies   CURRENT MEDICATIONS:   Current Outpatient  Medications  Medication Sig Dispense Refill  . acetaminophen (TYLENOL) 500 MG tablet Take 1,000 mg by mouth daily as needed for moderate pain.     [name] aspirin EC 81 MG tablet Take 81 mg by mouth every evening.     [name] atorvastatin (LIPITOR) 20 MG tablet Take 20 mg by mouth daily.     . carvedilol (COREG) 3.125 MG tablet TAKE 1 TABLET TWICE A DAY (NEED TO CALL AND MAKE AN APPOINTMENT FOR FUTURE REFILLS) 180 tablet 0  . clopidogrel (PLAVIX) 75 MG tablet Take 75 mg by mouth every evening.     [name] dextromethorphan-guaiFENesin (MUCINEX DM) 30-600 MG 12hr tablet Take 1 tablet by mouth daily.    [name] ENTRESTO 49-51 MG TAKE 1 TABLET TWICE A DAY 938 tablet 4  . folic acid (FOLVITE) 182 MCG tablet Take 800 mcg by mouth daily.    [name] gabapentin (NEURONTIN) 300 MG capsule Take 300 mg by mouth 2 (two) times daily.     . Multiple Vitamins-Minerals (CENTRUM SILVER PO) Take 1 tablet by mouth daily.     . pantoprazole (PROTONIX) 40 MG tablet TAKE 1 TABLET DAILY 90 tablet 4  . Potassium 99 MG TABS Take 99 mg by mouth daily.    . SUPER B COMPLEX/C PO Take 1 tablet by mouth daily.    . tamsulosin (FLOMAX) 0.4 MG CAPS capsule Take 0.4 mg by mouth daily.    . Tetrahydrozoline HCl (VISINE OP) Place 1 drop into both eyes daily as needed (dry eyes).     No current facility-administered medications for this visit.     REVIEW OF SYSTEMS:   [X]  denotes positive finding, [ ]  denotes negative finding Cardiac  Comments:  Chest pain or chest pressure:    Shortness of breath upon exertion:    Short of breath when lying flat:    Irregular heart rhythm:        Vascular    Pain in calf, thigh, or hip brought on by ambulation:    Pain in feet at night that wakes you up from your sleep:     Blood clot in your veins:    Leg swelling:         Pulmonary    Oxygen at home:    Productive cough:     Wheezing:         Neurologic    Sudden weakness in arms or legs:     Sudden numbness in arms or legs:     Sudden onset of  difficulty speaking or slurred speech:    Temporary loss of vision in one eye:     Problems with dizziness:         Gastrointestinal    Blood in stool:     Vomited blood:  Genitourinary    Burning when urinating:     Blood in urine:        Psychiatric    Major depression:         Hematologic    Bleeding problems:    Problems with blood clotting too easily:        Skin    Rashes or ulcers:        Constitutional    Fever or chills:      PHYSICAL EXAM:   Vitals:   10/27/18 1034  BP: (!) 144/76  Pulse: 74  Resp: 18  Temp: 97.7 F (36.5 C)  TempSrc: Oral  SpO2: 97%  Weight: 162 lb (73.5 kg)  Height: 5\' 3"  (1.6 m)    GENERAL: The patient is a well-nourished male, in no acute distress. The vital signs are documented above. CARDIAC: There is a regular rate and rhythm.  VASCULAR: Nonpalpable pedal pulses PULMONARY: Non-labored respirations MUSCULOSKELETAL: There are no major deformities or cyanosis. NEUROLOGIC: No focal weakness or paresthesias are detected. SKIN: There are no ulcers or rashes noted. PSYCHIATRIC: The patient has a normal affect.  STUDIES:   None  MEDICAL ISSUES:   Claudication: Patient was found to have elevated velocities bilaterally on ultrasound 2 months ago however he is not having significant symptoms.  Patient is requiring frequent interventions, likely secondary to his ongoing tobacco abuse which we discussed today.  I have been scheduled to follow-up in 3 months.  If he becomes symptomatic or if there is a significant change in his ultrasound duplex findings, angiography would be the next step.    Annamarie Major, MD [facility] [phone] Pager [phone]

## 2018-11-10 ENCOUNTER — Ambulatory Visit: Payer: Medicare Other | Admitting: Surgery

## 2018-11-26 LAB — CUP PACEART REMOTE DEVICE CHECK
Battery Remaining Percentage: 59 %
Battery Voltage: 2.95 V
Brady Statistic AP VS Percent: 1 %
Brady Statistic AS VP Percent: 82 %
Brady Statistic AS VS Percent: 1 %
HIGH POWER IMPEDANCE MEASURED VALUE: 79 Ohm
HighPow Impedance: 79 Ohm
Implantable Lead Implant Date: 20160923
Implantable Lead Location: 753860
Implantable Lead Model: 7122
Implantable Pulse Generator Implant Date: 20160923
Lead Channel Impedance Value: 440 Ohm
Lead Channel Impedance Value: 510 Ohm
Lead Channel Impedance Value: 780 Ohm
Lead Channel Pacing Threshold Amplitude: 0.75 V
Lead Channel Pacing Threshold Pulse Width: 0.5 ms
Lead Channel Pacing Threshold Pulse Width: 0.5 ms
Lead Channel Sensing Intrinsic Amplitude: 12 mV
Lead Channel Setting Pacing Amplitude: 2 V
Lead Channel Setting Pacing Amplitude: 2.25 V
Lead Channel Setting Pacing Pulse Width: 0.8 ms
MDC IDC LEAD IMPLANT DT: 20160923
MDC IDC LEAD IMPLANT DT: 20160923
MDC IDC LEAD LOCATION: 753858
MDC IDC LEAD LOCATION: 753859
MDC IDC MSMT BATTERY REMAINING LONGEVITY: 48 mo
MDC IDC MSMT LEADCHNL LV PACING THRESHOLD AMPLITUDE: 1 V
MDC IDC MSMT LEADCHNL LV PACING THRESHOLD PULSEWIDTH: 0.8 ms
MDC IDC MSMT LEADCHNL RA SENSING INTR AMPL: 4.4 mV
MDC IDC MSMT LEADCHNL RV PACING THRESHOLD AMPLITUDE: 0.875 V
MDC IDC PG SERIAL: 7294918
MDC IDC SESS DTM: 20191104074347
MDC IDC SET LEADCHNL RV PACING AMPLITUDE: 2 V
MDC IDC SET LEADCHNL RV PACING PULSEWIDTH: 0.5 ms
MDC IDC SET LEADCHNL RV SENSING SENSITIVITY: 0.5 mV
MDC IDC STAT BRADY AP VP PERCENT: 17 %
MDC IDC STAT BRADY RA PERCENT PACED: 17 %

## 2018-12-10 ENCOUNTER — Ambulatory Visit (INDEPENDENT_AMBULATORY_CARE_PROVIDER_SITE_OTHER): Payer: Medicare Other | Admitting: Internal Medicine

## 2018-12-10 ENCOUNTER — Encounter: Payer: Self-pay | Admitting: Internal Medicine

## 2018-12-10 DIAGNOSIS — I255 Ischemic cardiomyopathy: Secondary | ICD-10-CM | POA: Diagnosis not present

## 2018-12-10 LAB — CUP PACEART INCLINIC DEVICE CHECK
Battery Remaining Longevity: 46 mo
Brady Statistic RA Percent Paced: 17 %
Brady Statistic RV Percent Paced: 99.03 %
Date Time Interrogation Session: 20200115100839
HIGH POWER IMPEDANCE MEASURED VALUE: 81 Ohm
Implantable Lead Implant Date: 20160923
Implantable Lead Implant Date: 20160923
Implantable Lead Location: 753858
Implantable Lead Model: 7122
Lead Channel Impedance Value: 450 Ohm
Lead Channel Impedance Value: 512.5 Ohm
Lead Channel Pacing Threshold Amplitude: 0.5 V
Lead Channel Pacing Threshold Amplitude: 0.5 V
Lead Channel Pacing Threshold Amplitude: 0.75 V
Lead Channel Pacing Threshold Amplitude: 1.5 V
Lead Channel Pacing Threshold Pulse Width: 0.5 ms
Lead Channel Pacing Threshold Pulse Width: 0.5 ms
Lead Channel Pacing Threshold Pulse Width: 0.5 ms
Lead Channel Pacing Threshold Pulse Width: 0.8 ms
Lead Channel Pacing Threshold Pulse Width: 0.8 ms
Lead Channel Sensing Intrinsic Amplitude: 12 mV
Lead Channel Sensing Intrinsic Amplitude: 4.5 mV
Lead Channel Setting Pacing Amplitude: 2 V
Lead Channel Setting Pacing Pulse Width: 0.5 ms
Lead Channel Setting Pacing Pulse Width: 0.8 ms
Lead Channel Setting Sensing Sensitivity: 0.5 mV
MDC IDC LEAD IMPLANT DT: 20160923
MDC IDC LEAD LOCATION: 753859
MDC IDC LEAD LOCATION: 753860
MDC IDC MSMT LEADCHNL LV PACING THRESHOLD AMPLITUDE: 1.5 V
MDC IDC MSMT LEADCHNL RA PACING THRESHOLD AMPLITUDE: 0.75 V
MDC IDC MSMT LEADCHNL RV IMPEDANCE VALUE: 825 Ohm
MDC IDC MSMT LEADCHNL RV PACING THRESHOLD PULSEWIDTH: 0.5 ms
MDC IDC PG IMPLANT DT: 20160923
MDC IDC PG SERIAL: 7294918
MDC IDC SET LEADCHNL LV PACING AMPLITUDE: 2.25 V
MDC IDC SET LEADCHNL RV PACING AMPLITUDE: 2 V

## 2018-12-10 NOTE — Progress Notes (Signed)
HPI [name] returns today for ongoing evaluation of his ICD, chronic systolic heart failure, coronary artery disease, and COPD. In the interim, he has been bothered by peripheral vascular disease and has undergone revascularization. His feet and legs are much better. His claudication is much improved. Unfortunately he continues to smoke cigarettes, one pack per day. He denies anginal symptoms, shortness of breath, syncope, or any ICD shock. He is still smoking however. No Known Allergies   Current Outpatient Medications  Medication Sig Dispense Refill  . acetaminophen (TYLENOL) 500 MG tablet Take 1,000 mg by mouth daily as needed for moderate pain.     [name] aspirin EC 81 MG tablet Take 81 mg by mouth every evening.     [name] atorvastatin (LIPITOR) 20 MG tablet Take 20 mg by mouth daily.     . carvedilol (COREG) 3.125 MG tablet TAKE 1 TABLET TWICE A DAY (NEED TO CALL AND MAKE AN APPOINTMENT FOR FUTURE REFILLS) 180 tablet 4  . clopidogrel (PLAVIX) 75 MG tablet Take 75 mg by mouth every evening.     [name] dextromethorphan-guaiFENesin (MUCINEX DM) 30-600 MG 12hr tablet Take 1 tablet by mouth daily.    [name] ENTRESTO 49-51 MG TAKE 1 TABLET TWICE A DAY 710 tablet 4  . folic acid (FOLVITE) 626 MCG tablet Take 800 mcg by mouth daily.    [name] gabapentin (NEURONTIN) 300 MG capsule Take 300 mg by mouth 2 (two) times daily.     . Multiple Vitamins-Minerals (CENTRUM SILVER PO) Take 1 tablet by mouth daily.     . pantoprazole (PROTONIX) 40 MG tablet TAKE 1 TABLET DAILY 90 tablet 4  . Potassium 99 MG TABS Take 99 mg by mouth daily.    . SUPER B COMPLEX/C PO Take 1 tablet by mouth daily.    . tamsulosin (FLOMAX) 0.4 MG CAPS capsule Take 0.4 mg by mouth 2 (two) times daily.     . Tetrahydrozoline HCl (VISINE OP) Place 1 drop into both eyes daily as needed (dry eyes).     No current facility-administered medications for this visit.      Past Medical History:  Diagnosis Date  . AICD (automatic  cardioverter/defibrillator) present 08/19/2015   St Jude BiV ICD for primary prevention by Dr. Lovena Le  . Alcohol abuse    6 beers per day; hospital admission in 2009 for withdrawal symptoms  . Alcoholic cirrhosis ([place])   . Anxiety and depression   . Cerebrovascular disease 2009   TIA; 2009- right ICA stent; re-intervention for restenosis complicated by [facility] w/o sx  . CHF (congestive heart failure) ([place]) 06-02-14  . Chronic obstructive pulmonary disease ([place])   . Degenerative joint disease    Total shoulder arthroplasty-right  . Hyperlipidemia   . Hypertension   . LBBB (left bundle branch block)    Normal echo-2011; stress nuclear in 09/2010--septal hypoperfusion representing nontransmural infarction or the effect of left bundle branch block, no ischemia  . Myocardial infarction [facility]) June 02, 2014   Massive Heart Attack  . Peripheral vascular disease ([place])   . 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  . Thrombocytopenia ([place])   . Tobacco abuse    -100 pack years; 1.5 packs per day  . Traumatic seroma of thigh ([place])    left  . Tubular adenoma of colon     ROS:   All systems reviewed and negative except as noted in the HPI.   Past Surgical History:  Procedure Laterality Date  . ABDOMINAL  AORTAGRAM [name] 05/04/2014   Procedure: ABDOMINAL Maxcine Ham;  Surgeon: [name], MD;  Location: [facility] CATH LAB;  Service: Cardiovascular;  Laterality: [name];  . ABDOMINAL AORTOGRAM [name] 06/25/2017   Procedure: Abdominal Aortogram;  Surgeon: [name], MD;  Location: [place] CV LAB;  Service: Cardiovascular;  Laterality: [name];  . ABDOMINAL AORTOGRAM W/LOWER EXTREMITY [name] 04/22/2018   Procedure: ABDOMINAL AORTOGRAM W/LOWER EXTREMITY;  Surgeon: [name], MD;  Location: [place] CV LAB;  Service: Cardiovascular;  Laterality: [name];  . AGILE CAPSULE [name] 03/18/2014   Procedure: AGILE CAPSULE;  Surgeon: Danie Binder, MD;  [facility] END[facility];  Service: Endoscopy;  Laterality:  [name];  7:30  . APPLICATION OF WOUND VAC Left 09/03/2017   Procedure: APPLICATION OF WOUND VAC;  Surgeon: Conrad Penuelas, MD;  Location: [place];  Service: Vascular;  Laterality: Left;  . BACK SURGERY    . BACTERIAL OVERGROWTH TEST [name] 05/24/2015   Procedure: BACTERIAL OVERGROWTH TEST;  Surgeon: Danie Binder, MD;  [facility];  Service: Endoscopy;  Laterality: [name];  0700  . BI-VENTRICULAR IMPLANTABLE CARDIOVERTER DEFIBRILLATOR  (CRT-D)  08/19/2015  . CARPAL TUNNEL RELEASE Left 02/02/2016   Procedure: LEFT CARPAL TUNNEL RELEASE;  Surgeon: Daryll Brod, MD;  Location: [place];  Service: Orthopedics;  Laterality: Left;  ANESTHESIA: IV REGIONAL UPPER ARM  . CATARACT EXTRACTION W/PHACO Right 03/08/2015   Procedure: CATARACT EXTRACTION PHACO AND INTRAOCULAR LENS PLACEMENT (IOC);  Surgeon: Rutherford Guys, MD;  Location: [facility] ORS;  Service: Ophthalmology;  Laterality: Right;  CDE:9.46  . CATARACT EXTRACTION W/PHACO Left 03/22/2015   Procedure: CATARACT EXTRACTION PHACO AND INTRAOCULAR LENS PLACEMENT (IOC);  Surgeon: Rutherford Guys, MD;  Location: [facility] OR[facility];  Service: Ophthalmology;  Laterality: Left;  CDE:5.80  . COLONOSCOPY  08/22/09   Fields-(Tubular Adenoma)3-mm transverse polyp/4-mm polyp otherwise noraml/small internal hemorrhoids  . COLONOSCOPY [name] 04/14/2014   HER:DEYCX internal hemorrhids/normal mocsa in the terminal iluem/left colonis redundant  . COLONOSCOPY W/ POLYPECTOMY  2011  . ENDARTERECTOMY FEMORAL Left 08/16/2017   Procedure: ENDARTERECTOMY LEFT PROFUNDA FEMORAL;  Surgeon: [name], MD;  Location: [place];  Service: Vascular;  Laterality: Left;  . EP IMPLANTABLE DEVICE [name] 08/19/2015   Procedure: BiV ICD Insertion CRT-D;  Surgeon: Evans Lance, MD;  Location: [place] CV LAB;  Service: Cardiovascular;  Laterality: [name];  . ESOPHAGOGASTRODUODENOSCOPY [name] 03/05/2014   SLF: 1. Stricture at the gastroesophagael junction 2. Small hiatal hernia 3. Moderate non-erosive  gastritis and duodentitis. 4. No surce for Melena identified.   . FEMORAL-POPLITEAL BYPASS GRAFT Left 08/16/2017   Procedure: LEFT  FEMORAL-POPLITEAL ARTERY  BYPASS GRAFT;  Surgeon: [name], MD;  Location: [place];  Service: Vascular;  Laterality: Left;  . GIVENS CAPSULE STUDY [name] 03/30/2014   Procedure: GIVENS CAPSULE STUDY;  Surgeon: Danie Binder, MD;  [facility];  Service: Endoscopy;  Laterality: [name];  7:30  . I&D EXTREMITY Left 09/03/2017   Procedure: IRRIGATION AND DEBRIDEMENT EXTREMITY LEFT THIGH SEROMA;  Surgeon: Conrad Gazelle, MD;  Location[facility] OR;  Service: Vascular;  Laterality: Left;  . IR ANGIO INTRA EXTRACRAN SEL COM CAROTID INNOMINATE BILAT MOD SED  07/16/2017  . IR ANGIO VERTEBRAL SEL SUBCLAVIAN INNOMINATE BILAT MOD SED  07/16/2017  . IR RADIOLOGIST EVAL & MGMT  04/01/2017  . IR RADIOLOGIST EVAL & MGMT  08/02/2017  . JOINT REPLACEMENT Right   . LOWER EXTREMITY ANGIOGRAPHY Bilateral 06/25/2017   Procedure: Lower Extremity Angiography;  Surgeon: [name], MD;  Location: [place] CV LAB;  Service: Cardiovascular;  Laterality: Bilateral;  . LUMBAR FUSION  2010  . PERIPHERAL VASCULAR ATHERECTOMY Right 04/22/2018   Procedure: PERIPHERAL VASCULAR ATHERECTOMY;  Surgeon: [name], MD;  Location: [place] CV LAB;  Service: Cardiovascular;  Laterality: Right;  right superficial femoral  . PERIPHERAL VASCULAR BALLOON ANGIOPLASTY Right 04/22/2018   Procedure: PERIPHERAL VASCULAR BALLOON ANGIOPLASTY;  Surgeon: [name], MD;  Location: [place] CV LAB;  Service: Cardiovascular;  Laterality: Right;  external iliac  . TOTAL SHOULDER ARTHROPLASTY Right 2011   Dr. Tamera Punt  . ULNAR NERVE TRANSPOSITION Left 02/02/2016   Procedure: LEFT IN-SITU DECOMPRESSION ULNAR NERVE ;  Surgeon: Daryll Brod, MD;  Location: [place];  Service: Orthopedics;  Laterality: Left;  . ULNAR TUNNEL RELEASE Left 02/02/2016   Procedure: LEFT CUBITAL TUNNEL RELEASE;   Surgeon: Daryll Brod, MD;  Location: [place];  Service: Orthopedics;  Laterality: Left;  [name] VASECTOMY  1971  . VEIN HARVEST Left 08/16/2017   Procedure: USING NON REVERSE LEFT GREATER SAPHENOUS VEIN HARVEST;  Surgeon: [name], MD;  Location: [facility] OR;  Service: Vascular;  Laterality: Left;     Family History  Problem Relation Age of Onset  . Coronary artery disease Mother   . Diabetes Mother   . Heart disease Mother        Before age 53 - 59 Bypasses  . Hypertension Mother   . Heart attack Mother        3-4 Heart attacks  . Alzheimer's disease Father   . Diabetes Father   . Heart attack Sister   . Cancer Brother        Prostate  . Hyperlipidemia Son   . Prostate cancer Brother   . Alzheimer's disease Sister   . Colon cancer Neg Hx      Social History   Socioeconomic History  . Marital status: Married    Spouse name: Not on file  . Number of children: 2  . Years of education: Not on file  . Highest education level: Not on file  Occupational History  . Occupation: retired, Clinical biochemist    Comment: Programmer, systems: RETIRED  Social Needs  . Financial resource strain: Not on file  . Food insecurity:    Worry: Not on file    Inability: Not on file  . Transportation needs:    Medical: Not on file    Non-medical: Not on file  Tobacco Use  . Smoking status: Current Every Day Smoker    Packs/day: 1.00    Years: 60.00    Pack years: 60.00    Types: Cigarettes    Start date: 08/20/1957  . Smokeless tobacco: Never Used  Substance and Sexual Activity  . Alcohol use: No    Alcohol/week: 0.0 standard drinks    Comment: quit in July 2015  . Drug use: No  . Sexual activity: Never  Lifestyle  . Physical activity:    Days per week: Not on file    Minutes per session: Not on file  . Stress: Not on file  Relationships  . Social connections:    Talks on phone: Not on file    Gets together: Not on file    Attends religious service: Not on file      Active member of club or organization: Not on file    Attends meetings of clubs or organizations: Not on file  Relationship status: Not on file  . Intimate partner violence:    Fear of current or ex partner: Not on file    Emotionally abused: Not on file    Physically abused: Not on file    Forced sexual activity: Not on file  Other Topics Concern  . Not on file  Social History Narrative   Accompanied by daughter [name]   Lives w/ wife, daughter     BP 122/60 (BP Location: Left Arm)   Pulse 84   Ht 5\' 3"  (1.6 m)   Wt 162 lb (73.5 kg)   SpO2 91%   BMI 28.70 kg/m   Physical Exam:  Well appearing NAD HEENT: Unremarkable Neck:  No JVD, no thyromegally Lymphatics:  No adenopathy Back:  No CVA tenderness Lungs:  Clear with some reduced breath sounds. HEART:  Regular rate rhythm, no murmurs, no rubs, no clicks Abd:  soft, positive bowel sounds, no organomegally, no rebound, no guarding Ext:  2 plus pulses, no edema, no cyanosis, no clubbing Skin:  No rashes no nodules Neuro:  CN II through XII intact, motor grossly intact   DEVICE  Normal device function.  See PaceArt for details.   Assess/Plan: 1. Chronic systolic heart failure - his symptoms are class 2. He is encouraged to increase his activity, remain on a low sodium diet, and continue current meds. 2. COPD - I strongly encouraged him to stop smoking. 3. Claudication - still present but tolerable. He will exercise.  4. ICD - his St. Jude biv ICD is working normally. We will recheck in several months.  Mikle Bosworth.D.

## 2018-12-10 NOTE — Patient Instructions (Signed)
Medication Instructions:  Your physician recommends that you continue on your current medications as directed. Please refer to the Current Medication list given to you today.  If you need a refill on your cardiac medications before your next appointment, please call your pharmacy.   Lab work: NONE  If you have labs (blood work) drawn today and your tests are completely normal, you will receive your results only by: . MyChart Message (if you have MyChart) OR . A paper copy in the mail If you have any lab test that is abnormal or we need to change your treatment, we will call you to review the results.  Testing/Procedures: NONE   Follow-Up: At [facility], you and your health needs are our priority.  As part of our continuing mission to provide you with exceptional heart care, we have created designated Provider Care Teams.  These Care Teams include your primary Cardiologist (physician) and Advanced Practice Providers (APPs -  Physician Assistants and Nurse Practitioners) who all work together to provide you with the care you need, when you need it. You will need a follow up appointment in 1 years.  Please call our office 2 months in advance to schedule this appointment.  You may see Suresh Koneswaran, MD or one of the following Advanced Practice Providers on your designated Care Team:   Brittany Strader, PA-C ([place] Office) . Michele Lenze, PA-C ([place] Office)  Any Other Special Instructions Will Be Listed Below (If Applicable). Thank you for choosing [facility]!     

## 2018-12-14 IMAGING — CT CT ANGIO HEAD
1 of 12 series · 5 of 33 positions shown · IV contrast (isovue)
Comparison: 01/15/2012 angiogram.

CLINICAL DATA: 71 y/o M; follow-up of stenosis. Stented right
internal carotid artery.

EXAM:
CT ANGIOGRAPHY HEAD AND NECK
TECHNIQUE: Multidetector CT imaging of the head and neck was performed using
the standard protocol during bolus administration of intravenous
contrast. Multiplanar CT image reconstructions and MIPs were
obtained to evaluate the vascular anatomy. Carotid stenosis
measurements (when applicable) are obtained utilizing NASCET
criteria, using the distal internal carotid diameter as the
denominator.
CONTRAST:  50 cc Isovue 370

[Series 11: cta neck axial · axial · 0.44mm/px · z∈[-280,-40]mm · 5 of 362 slices shown]
[im 61/362  soft-tissue]
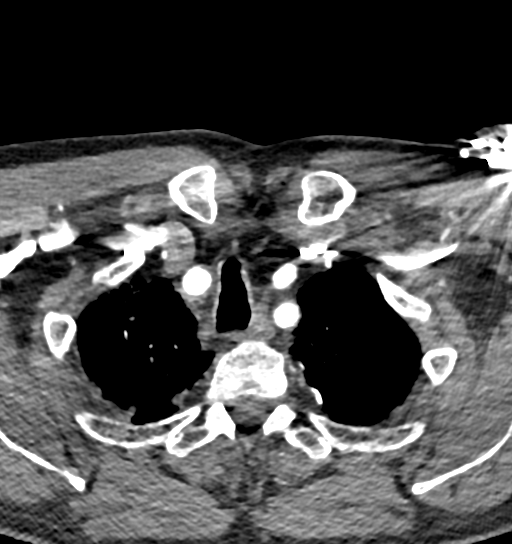
[im 121/362  bone]
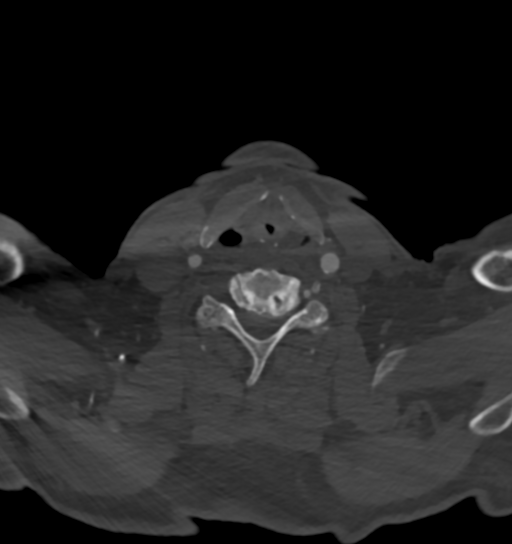
[im 181/362  soft-tissue]
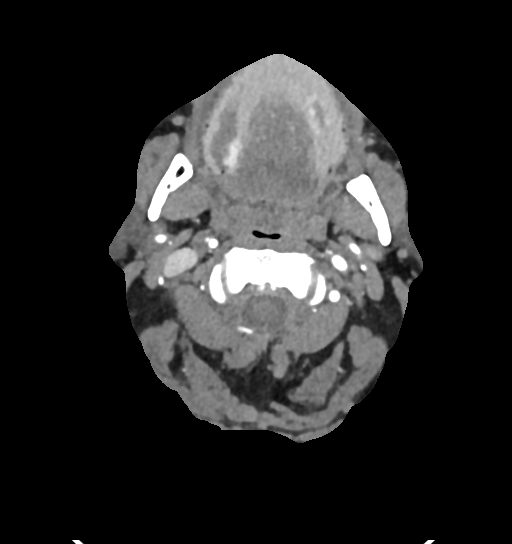
[im 241/362  bone]
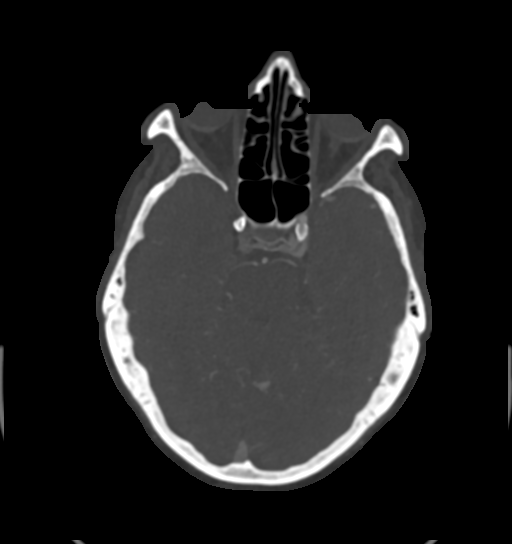
[im 301/362  soft-tissue]
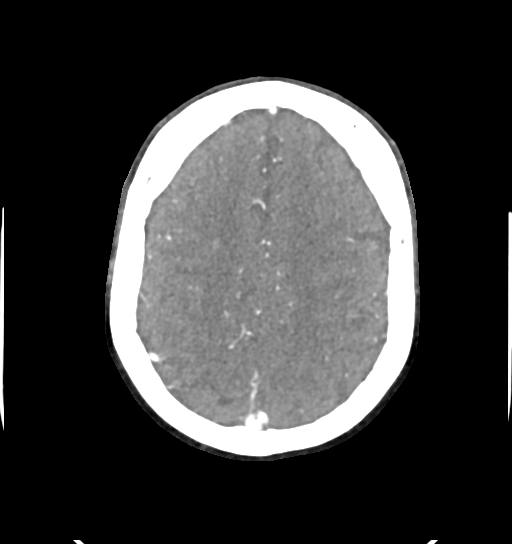

[5 of 33 positions shown; findings below may reference images not displayed]

FINDINGS: CT HEAD FINDINGS

Brain: No evidence of acute infarction, hemorrhage, hydrocephalus,
extra-axial collection or mass lesion/mass effect. Mild chronic
microvascular ischemic changes of white matter parenchymal volume
loss of the brain.

Vascular: As below.

Skull: Normal. Negative for fracture or focal lesion.

Sinuses: Imaged portions are clear.

Orbits: No acute finding.

Review of the MIP images confirms the above findings

CTA NECK FINDINGS

Aortic arch: Bovine arch, normal variant. Imaged portion shows no
evidence of aneurysm or dissection. No significant stenosis of the
major arch vessel origins. Moderate calcific atherosclerosis.

Right carotid system: No evidence of dissection, stenosis (50% or
greater) or occlusion. Moderate noncalcified plaque of the carotid
bifurcation.

Left carotid system: No evidence of dissection, stenosis (50% or
greater) or occlusion. Moderate calcified plaque of the bifurcation
with mild 40% proximal ICA stenosis. Mid common carotid artery and
mild less than 50% stenosis secondary to mixed plaque.

Vertebral arteries: Right V1 and V2 occlusion with intermittent
reconstitution of the V2 segment beginning at the C4 level and
patent V3/V4 segments.

Severe calcified plaque of left vertebral artery origin with
probable at least moderate underlying stenosis, accurate assessment
of luminal stenosis suboptimal given bulky calcification. The artery
is otherwise widely patent.

Skeleton: Mild cervical spondylosis greatest at the C5-6 level. No
high-grade bony canal stenosis.

Other neck: Partially visualized pacemaker and pacemaking wires in
SVC.

Upper chest: 5 mm right upper lobe pulmonary nodule (series 5, image
176). Mild centrilobular emphysema and pleuroparenchymal scarring of
the lung apices.

Review of the MIP images confirms the above findings

CTA HEAD FINDINGS

Anterior circulation: Right internal carotid artery stent of
cavernous segment. There is stenosis at the level of the distal
cavernous segment at its anterior curvature of approximately 70%
(series 12, image 79). Left internal carotid artery moderate
calcified plaque with mild paraclinoid stenosis. No large vessel
occlusion or aneurysm.

Posterior circulation: Short segment of right P2 mild stenosis.
Irregularity of the right V4 which is small in caliber. Left V4
calcified plaque without significant stenosis. Small proximal
basilar fenestration. No large vessel occlusion or aneurysm.

Venous sinuses: As permitted by contrast timing, patent.

Anatomic variants: Large left A1 and anterior communicating artery
without appreciable right A1, normal variant.

Delayed phase: No abnormal intracranial enhancement.

Review of the MIP images confirms the above findings
IMPRESSION: CT head:

1. No acute intracranial abnormality.  No abnormal enhancement.
2. Mild chronic microvascular ischemic changes and mild parenchymal
volume loss of the brain.
CTA neck:

1. Patent carotid arteries of the neck. No large vessel occlusion,
aneurysm, or significant stenosis is identified.
2. Occluded right vertebral artery to the C4 level where there is
reconstitution.
3. Severe calcified plaque of left vertebral artery origin with
probable underlying moderate to severe stenosis, suboptimal
assessment of luminal stenosis due to dense calcification.
4. 5 mm right upper lobe pulmonary nodule. No follow-up needed if
patient is low-risk. Non-contrast chest CT can be considered in 12
months if patient is high-risk. This recommendation follows the
consensus statement: Guidelines for Management of Incidental
Pulmonary Nodules Detected on CT Images: From the [HOSPITAL]
CTA head:

1. Stented right cavernous ICA. Stenosis of the distal cavernous
portion of stent of approximately 70%. Stent size is at limits of
resolution of CT technique.
2. Otherwise no large vessel occlusion, aneurysm, or high-grade
stenosis of circle of Willis.

By: Susu Armenta M.D.

## 2018-12-29 ENCOUNTER — Ambulatory Visit (INDEPENDENT_AMBULATORY_CARE_PROVIDER_SITE_OTHER): Payer: Medicare Other

## 2018-12-29 DIAGNOSIS — I255 Ischemic cardiomyopathy: Secondary | ICD-10-CM

## 2018-12-29 DIAGNOSIS — I5022 Chronic systolic (congestive) heart failure: Secondary | ICD-10-CM

## 2018-12-31 LAB — CUP PACEART REMOTE DEVICE CHECK
Battery Remaining Longevity: 44 mo
Date Time Interrogation Session: 20200203070023
HighPow Impedance: 77 Ohm
HighPow Impedance: 77 Ohm
Implantable Lead Implant Date: 20160923
Implantable Lead Implant Date: 20160923
Implantable Lead Location: 753858
Implantable Lead Location: 753859
Implantable Lead Location: 753860
Implantable Pulse Generator Implant Date: 20160923
Lead Channel Impedance Value: 490 Ohm
Lead Channel Pacing Threshold Amplitude: 0.75 V
Lead Channel Pacing Threshold Amplitude: 1.5 V
Lead Channel Pacing Threshold Pulse Width: 0.5 ms
Lead Channel Pacing Threshold Pulse Width: 0.8 ms
Lead Channel Sensing Intrinsic Amplitude: 5 mV
Lead Channel Setting Pacing Amplitude: 2 V
Lead Channel Setting Pacing Pulse Width: 0.5 ms
MDC IDC LEAD IMPLANT DT: 20160923
MDC IDC MSMT BATTERY REMAINING PERCENTAGE: 56 %
MDC IDC MSMT BATTERY VOLTAGE: 2.93 V
MDC IDC MSMT LEADCHNL LV IMPEDANCE VALUE: 410 Ohm
MDC IDC MSMT LEADCHNL RA PACING THRESHOLD AMPLITUDE: 0.75 V
MDC IDC MSMT LEADCHNL RA PACING THRESHOLD PULSEWIDTH: 0.5 ms
MDC IDC MSMT LEADCHNL RV IMPEDANCE VALUE: 740 Ohm
MDC IDC MSMT LEADCHNL RV SENSING INTR AMPL: 12 mV
MDC IDC SET LEADCHNL LV PACING AMPLITUDE: 2.25 V
MDC IDC SET LEADCHNL LV PACING PULSEWIDTH: 0.8 ms
MDC IDC SET LEADCHNL RA PACING AMPLITUDE: 2 V
MDC IDC SET LEADCHNL RV SENSING SENSITIVITY: 0.5 mV
MDC IDC STAT BRADY AP VP PERCENT: 23 %
MDC IDC STAT BRADY AP VS PERCENT: 1 %
MDC IDC STAT BRADY AS VP PERCENT: 76 %
MDC IDC STAT BRADY AS VS PERCENT: 1 %
MDC IDC STAT BRADY RA PERCENT PACED: 22 %
Pulse Gen Serial Number: 7294918

## 2019-01-06 NOTE — Progress Notes (Signed)
Remote ICD transmission.   

## 2019-01-22 ENCOUNTER — Other Ambulatory Visit: Payer: Self-pay

## 2019-01-22 DIAGNOSIS — F17200 Nicotine dependence, unspecified, uncomplicated: Secondary | ICD-10-CM

## 2019-01-22 DIAGNOSIS — I70213 Atherosclerosis of native arteries of extremities with intermittent claudication, bilateral legs: Secondary | ICD-10-CM

## 2019-01-22 DIAGNOSIS — I779 Disorder of arteries and arterioles, unspecified: Secondary | ICD-10-CM

## 2019-01-26 ENCOUNTER — Ambulatory Visit (INDEPENDENT_AMBULATORY_CARE_PROVIDER_SITE_OTHER)
Admission: RE | Admit: 2019-01-26 | Discharge: 2019-01-26 | Disposition: A | Discharge status: Home / Self Care | Payer: Medicare Other | Source: Ambulatory Visit | Attending: Surgery | Admitting: Surgery

## 2019-01-26 ENCOUNTER — Ambulatory Visit (INDEPENDENT_AMBULATORY_CARE_PROVIDER_SITE_OTHER): Payer: Medicare Other | Admitting: Surgery

## 2019-01-26 ENCOUNTER — Encounter: Payer: Self-pay | Admitting: Surgery

## 2019-01-26 ENCOUNTER — Other Ambulatory Visit: Payer: Self-pay

## 2019-01-26 ENCOUNTER — Ambulatory Visit (HOSPITAL_COMMUNITY)
Admission: RE | Admit: 2019-01-26 | Discharge: 2019-01-26 | Disposition: A | Discharge status: Home / Self Care | Payer: Medicare Other | Source: Ambulatory Visit | Attending: Surgery | Admitting: Surgery

## 2019-01-26 VITALS — BP 144/69 | HR 60 | Temp 97.0°F | Resp 20 | Ht 63.0 in | Wt 162.7 lb

## 2019-01-26 DIAGNOSIS — I70213 Atherosclerosis of native arteries of extremities with intermittent claudication, bilateral legs: Secondary | ICD-10-CM

## 2019-01-26 DIAGNOSIS — F17200 Nicotine dependence, unspecified, uncomplicated: Secondary | ICD-10-CM

## 2019-01-26 DIAGNOSIS — I779 Disorder of arteries and arterioles, unspecified: Secondary | ICD-10-CM

## 2019-01-26 NOTE — Progress Notes (Signed)
[facility] [place] [facility]  Patient name: Roy [name] MRN: [id number] DOB: [birth year] Sex: male   REASON FOR VISIT:    Follow up  HISOTRY OF PRESENT ILLNESS:   This is a 73 year old gentleman who returns today for follow-up of his claudication symptoms. He is status post bilateral iliac stenting in 2015. This was done for lifestyle limiting claudication. An 8 x 40 self extending stent was placed on the left and 8 x 80 on the right. He developed progressive symptoms and on09/21/2018 he underwent a left femoral to above-knee popliteal artery bypass graft with ipsilateral non-reversed greater saphenous vein as well as the extensive profunda femoral endarterectomy. He did well initially however developed a fluid collection around the distal incision, and went back for I and D on 09/03/2017 with wound VAC placement.  He began having significant right leg symptoms and on 04/22/2018 he underwent atherectomy and drug-coated balloon angioplasty of the right superficial femoral artery as well as angioplasty of the right external iliac artery.  He was seen in October by Vinnie Level and found to have elevated velocities in the distal stent on the left and in the native artery on the right.  He was asymptomatic.  He is back today for follow-up.  He continues to not have any significant symptoms.  He has trouble when he walks up inclines, however he can walk around [employer] without any difficulties.  His symptoms were not lifestyle limiting.  He did not have any open wounds.  The patient has a significant smoking history with no interest of stopping. He has a history of stroke as well as a intracranial carotid stent. He has a history of significant alcohol consumption with hospitalization for withdrawal. He is medically managed for hypertension. He is on a statin for hypercholesterolemia. He is on dual agent antiplatelet therapy with aspirin and  Plavix.   PAST MEDICAL HISTORY:   Past Medical History:  Diagnosis Date  . AICD (automatic cardioverter/defibrillator) present 08/19/2015   [employer] BiV ICD for primary prevention by Dr. Lovena Le  . Alcohol abuse    6 beers per day; hospital admission in 2009 for withdrawal symptoms  . Alcoholic cirrhosis ([place])   . Anxiety and depression   . Cerebrovascular disease 2009   TIA; 2009- right ICA stent; re-intervention for restenosis complicated by [facility] w/o sx  . CHF (congestive heart failure) ([place]) 06-02-14  . Chronic obstructive pulmonary disease ([place])   . Degenerative joint disease    Total shoulder arthroplasty-right  . Hyperlipidemia   . Hypertension   . LBBB (left bundle branch block)    Normal echo-2011; stress nuclear in 09/2010--septal hypoperfusion representing nontransmural infarction or the effect of left bundle branch block, no ischemia  . Myocardial infarction [facility]) June 02, 2014   Massive Heart Attack  . Peripheral vascular disease ([place])   . 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  . Thrombocytopenia ([place])   . Tobacco abuse    -100 pack years; 1.5 packs per day  . Traumatic seroma of thigh ([place])    left  . Tubular adenoma of colon      FAMILY HISTORY:   Family History  Problem Relation Age of Onset  . Coronary artery disease Mother   . Diabetes Mother   . Heart disease Mother        Before age 65 - 18 Bypasses  . Hypertension Mother   . Heart attack Mother        3-4 Heart attacks  .  Alzheimer's disease Father   . Diabetes Father   . Heart attack Sister   . Cancer Brother        Prostate  . Hyperlipidemia Son   . Prostate cancer Brother   . Alzheimer's disease Sister   . Colon cancer Neg Hx     SOCIAL HISTORY:   Social History   Tobacco Use  . Smoking status: Current Every Day Smoker    Packs/day: 1.00    Years: 60.00    Pack years: 60.00    Types: Cigarettes    Start date: 08/20/1957  . Smokeless tobacco: Never Used  Substance Use  Topics  . Alcohol use: No    Alcohol/week: 0.0 standard drinks    Comment: quit in July 2015     ALLERGIES:   No Known Allergies   CURRENT MEDICATIONS:   Current Outpatient Medications  Medication Sig Dispense Refill  . acetaminophen (TYLENOL) 500 MG tablet Take 1,000 mg by mouth daily as needed for moderate pain.     [name] aspirin EC 81 MG tablet Take 81 mg by mouth every evening.     [name] atorvastatin (LIPITOR) 20 MG tablet Take 20 mg by mouth daily.     . carvedilol (COREG) 3.125 MG tablet TAKE 1 TABLET TWICE A DAY (NEED TO CALL AND MAKE AN APPOINTMENT FOR FUTURE REFILLS) 180 tablet 4  . clopidogrel (PLAVIX) 75 MG tablet Take 75 mg by mouth every evening.     [name] dextromethorphan-guaiFENesin (MUCINEX DM) 30-600 MG 12hr tablet Take 1 tablet by mouth daily.    [name] ENTRESTO 49-51 MG TAKE 1 TABLET TWICE A DAY 474 tablet 4  . folic acid (FOLVITE) 259 MCG tablet Take 800 mcg by mouth daily.    [name] gabapentin (NEURONTIN) 300 MG capsule Take 300 mg by mouth 2 (two) times daily.     . Multiple Vitamins-Minerals (CENTRUM SILVER PO) Take 1 tablet by mouth daily.     . pantoprazole (PROTONIX) 40 MG tablet TAKE 1 TABLET DAILY 90 tablet 4  . Potassium 99 MG TABS Take 99 mg by mouth daily.    . SUPER B COMPLEX/C PO Take 1 tablet by mouth daily.    . tamsulosin (FLOMAX) 0.4 MG CAPS capsule Take 0.4 mg by mouth 2 (two) times daily.     . Tetrahydrozoline HCl (VISINE OP) Place 1 drop into both eyes daily as needed (dry eyes).     No current facility-administered medications for this visit.     REVIEW OF SYSTEMS:   [X]  denotes positive finding, [ ]  denotes negative finding Cardiac  Comments:  Chest pain or chest pressure:    Shortness of breath upon exertion:    Short of breath when lying flat:    Irregular heart rhythm:        Vascular    Pain in calf, thigh, or hip brought on by ambulation: x   Pain in feet at night that wakes you up from your sleep:     Blood clot in your veins:    Leg  swelling:         Pulmonary    Oxygen at home:    Productive cough:     Wheezing:         Neurologic    Sudden weakness in arms or legs:     Sudden numbness in arms or legs:     Sudden onset of difficulty speaking or slurred speech:    Temporary loss of vision in one eye:  Problems with dizziness:         Gastrointestinal    Blood in stool:     Vomited blood:         Genitourinary    Burning when urinating:     Blood in urine:        Psychiatric    Major depression:         Hematologic    Bleeding problems:    Problems with blood clotting too easily:        Skin    Rashes or ulcers:        Constitutional    Fever or chills:      PHYSICAL EXAM:   Vitals:   01/26/19 0953  BP: (!) 144/69  Pulse: 60  Resp: 20  Temp: (!) 97 F (36.1 C)  SpO2: 98%  Weight: 162 lb 11.2 oz (73.8 kg)  Height: 5\' 3"  (1.6 m)    GENERAL: The patient is a well-nourished male, in no acute distress. The vital signs are documented above. CARDIAC: There is a regular rate and rhythm.  PULMONARY: Non-labored respirations MUSCULOSKELETAL: There are no major deformities or cyanosis. NEUROLOGIC: No focal weakness or paresthesias are detected. SKIN: There are no ulcers or rashes noted. PSYCHIATRIC: The patient has a normal affect.  STUDIES:   I have ordered and reviewed his vascular studies with the following findings: ABI/TBIToday's ABIToday's TBIPrevious ABIPrevious TBI +-------+-----------+-----------+------------+------------+ Right  0.79       0.58       0.89        0.89         +-------+-----------+-----------+------------+------------+ Left   0.97       0.91       0.98        0.81         +-------+-----------+-----------+------------+------------+  Right: 75 - 99% stenosis in the proximal superficial femoral artery. Calcific plaque noted throughout    Left: Patent femoral to popliteal bypass graft with increased velocity noted in the inflow artery (50 - 74%  stenosis range with narrowing and visualized plaque). MEDICAL ISSUES:   PAD: The patient basically remains asymptomatic.  He will have some cramping when he goes up hills but he can walk around [place] without any difficulty.  Inflow velocity on the left is 362.  There has not been a change in his ABIs.  This will need to be monitored and if it increases or he has a drop in his ABI, he would need to have this further evaluated with angiography.  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  I have him scheduled for follow-up in 6 months.    Annamarie Major, MD [facility] [phone] Pager [phone]

## 2019-02-12 ENCOUNTER — Other Ambulatory Visit: Payer: Self-pay | Admitting: Nurse Practitioner

## 2019-02-12 DIAGNOSIS — K7030 Alcoholic cirrhosis of liver without ascites: Secondary | ICD-10-CM

## 2019-02-12 NOTE — Progress Notes (Signed)
Routine cirrhosis OV cancelled due to COVID-19 processes for non-urgent visits. Historically well compensated disease (last MELD 7). Labs and imaging (when able to have done, based on lab/radiology COVID-19 processes). Will reschedule visit.

## 2019-02-18 ENCOUNTER — Telehealth: Payer: Self-pay | Admitting: *Deleted

## 2019-02-18 ENCOUNTER — Other Ambulatory Visit: Payer: Self-pay

## 2019-02-18 ENCOUNTER — Ambulatory Visit: Payer: Medicare Other | Admitting: Nurse Practitioner

## 2019-02-18 DIAGNOSIS — K7030 Alcoholic cirrhosis of liver without ascites: Secondary | ICD-10-CM

## 2019-02-18 NOTE — Telephone Encounter (Signed)
forwarding to [facility] regarding pt labs.  U/s scheduled for 4/24 at 9:30am, arrival time 9:15am, npo after midnight. Letter mailed. LMOVM

## 2019-02-18 NOTE — Telephone Encounter (Signed)
-----   Message from Carlis Stable, NP sent at 02/18/2019 11:06 AM EDT ----- Non-urgent, can be done when radiology can do it. His OV was cancelled due to covid and I put in orders for his routine [place] and labs.  Eric ----- Message ----- From: Inge Rise, CMA Sent: 02/18/2019   9:09 AM EDT To: Carlis Stable, NP  Patient is in our workque for u/s but nothing was sent to [place]. When is this needed?

## 2019-02-18 NOTE — Telephone Encounter (Signed)
Pt is aware of the lab orders. I am mailing them to him and he will do in the next week or so. He is aware the [place] has been scheduled for April and he will be getting the info in the mail.

## 2019-02-26 DIAGNOSIS — K7030 Alcoholic cirrhosis of liver without ascites: Secondary | ICD-10-CM | POA: Diagnosis not present

## 2019-02-27 LAB — CBC WITH DIFFERENTIAL/PLATELET
Absolute Monocytes: 562 cells/uL (ref 200–950)
Basophils Absolute: 38 cells/uL (ref 0–200)
Basophils Relative: 0.5 %
Eosinophils Absolute: 502 cells/uL — ABNORMAL HIGH (ref 15–500)
Eosinophils Relative: 6.6 %
HCT: 40.6 % (ref 38.5–50.0)
Hemoglobin: 14.2 g/dL (ref 13.2–17.1)
Lymphs Abs: 1490 cells/uL (ref 850–3900)
MCH: 32.1 pg (ref 27.0–33.0)
MCHC: 35 g/dL (ref 32.0–36.0)
MCV: 91.6 fL (ref 80.0–100.0)
MPV: 12 fL (ref 7.5–12.5)
Monocytes Relative: 7.4 %
Neutro Abs: 5008 cells/uL (ref 1500–7800)
Neutrophils Relative %: 65.9 %
Platelets: 240 10*3/uL (ref 140–400)
RBC: 4.43 10*6/uL (ref 4.20–5.80)
RDW: 11.7 % (ref 11.0–15.0)
Total Lymphocyte: 19.6 %
WBC: 7.6 10*3/uL (ref 3.8–10.8)

## 2019-02-27 LAB — COMPREHENSIVE METABOLIC PANEL
AG Ratio: 2 (calc) (ref 1.0–2.5)
ALT: 12 U/L (ref 9–46)
AST: 17 U/L (ref 10–35)
Albumin: 4.5 g/dL (ref 3.6–5.1)
Alkaline phosphatase (APISO): 78 U/L (ref 35–144)
BUN/Creatinine Ratio: 10 (calc) (ref 6–22)
BUN: 7 mg/dL (ref 7–25)
CO2: 27 mmol/L (ref 20–32)
Calcium: 9.5 mg/dL (ref 8.6–10.3)
Chloride: 103 mmol/L (ref 98–110)
Creat: 0.69 mg/dL — ABNORMAL LOW (ref 0.70–1.18)
Globulin: 2.3 g/dL (calc) (ref 1.9–3.7)
Glucose, Bld: 96 mg/dL (ref 65–99)
Potassium: 5.1 mmol/L (ref 3.5–5.3)
Sodium: 139 mmol/L (ref 135–146)
Total Bilirubin: 0.3 mg/dL (ref 0.2–1.2)
Total Protein: 6.8 g/dL (ref 6.1–8.1)

## 2019-02-27 LAB — AFP TUMOR MARKER: AFP-Tumor Marker: 1.4 ng/mL (ref <6.1)

## 2019-02-27 LAB — PROTIME-INR
INR: 1
Prothrombin Time: 10.7 s (ref 9.0–11.5)

## 2019-03-20 ENCOUNTER — Ambulatory Visit (HOSPITAL_COMMUNITY)
Admission: RE | Admit: 2019-03-20 | Discharge: 2019-03-20 | Disposition: A | Discharge status: Home / Self Care | Payer: Medicare Other | Source: Ambulatory Visit | Attending: Nurse Practitioner | Admitting: Nurse Practitioner

## 2019-03-20 ENCOUNTER — Other Ambulatory Visit: Payer: Self-pay

## 2019-03-20 DIAGNOSIS — K7030 Alcoholic cirrhosis of liver without ascites: Secondary | ICD-10-CM | POA: Diagnosis not present

## 2019-04-02 DIAGNOSIS — I5022 Chronic systolic (congestive) heart failure: Secondary | ICD-10-CM | POA: Diagnosis not present

## 2019-04-02 DIAGNOSIS — I10 Essential (primary) hypertension: Secondary | ICD-10-CM | POA: Diagnosis not present

## 2019-04-02 DIAGNOSIS — D509 Iron deficiency anemia, unspecified: Secondary | ICD-10-CM | POA: Diagnosis not present

## 2019-04-02 DIAGNOSIS — E782 Mixed hyperlipidemia: Secondary | ICD-10-CM | POA: Diagnosis not present

## 2019-04-06 ENCOUNTER — Ambulatory Visit (INDEPENDENT_AMBULATORY_CARE_PROVIDER_SITE_OTHER): Payer: Medicare Other | Admitting: *Deleted

## 2019-04-06 ENCOUNTER — Other Ambulatory Visit: Payer: Self-pay

## 2019-04-06 DIAGNOSIS — I255 Ischemic cardiomyopathy: Secondary | ICD-10-CM | POA: Diagnosis not present

## 2019-04-06 DIAGNOSIS — I5022 Chronic systolic (congestive) heart failure: Secondary | ICD-10-CM

## 2019-04-07 LAB — CUP PACEART REMOTE DEVICE CHECK
Date Time Interrogation Session: 20200512093128
Implantable Lead Implant Date: 20160923
Implantable Lead Implant Date: 20160923
Implantable Lead Implant Date: 20160923
Implantable Lead Location: 753858
Implantable Lead Location: 753859
Implantable Lead Location: 753860
Implantable Lead Model: 7122
Implantable Pulse Generator Implant Date: 20160923
Pulse Gen Serial Number: 7294918

## 2019-04-13 NOTE — Progress Notes (Signed)
Remote ICD transmission.   

## 2019-04-17 DIAGNOSIS — R7301 Impaired fasting glucose: Secondary | ICD-10-CM | POA: Diagnosis not present

## 2019-04-17 DIAGNOSIS — Z125 Encounter for screening for malignant neoplasm of prostate: Secondary | ICD-10-CM | POA: Diagnosis not present

## 2019-04-17 DIAGNOSIS — I10 Essential (primary) hypertension: Secondary | ICD-10-CM | POA: Diagnosis not present

## 2019-04-17 DIAGNOSIS — D509 Iron deficiency anemia, unspecified: Secondary | ICD-10-CM | POA: Diagnosis not present

## 2019-04-17 DIAGNOSIS — E782 Mixed hyperlipidemia: Secondary | ICD-10-CM | POA: Diagnosis not present

## 2019-04-21 ENCOUNTER — Telehealth: Payer: Self-pay | Admitting: Cardiovascular Disease

## 2019-04-21 ENCOUNTER — Other Ambulatory Visit: Payer: Self-pay | Admitting: Cardiovascular Disease

## 2019-04-21 MED ORDER — CARVEDILOL 3.125 MG PO TABS: ORAL_TABLET | ORAL | 0 refills | Status: DC

## 2019-04-21 NOTE — Telephone Encounter (Signed)
Needs refill on carvedilol (COREG) 3.125 MG tablet [[id number]]  Sent in, he's got an apt for 07/28/2019

## 2019-04-23 DIAGNOSIS — E875 Hyperkalemia: Secondary | ICD-10-CM | POA: Diagnosis not present

## 2019-04-23 DIAGNOSIS — E782 Mixed hyperlipidemia: Secondary | ICD-10-CM | POA: Diagnosis not present

## 2019-04-23 DIAGNOSIS — R7301 Impaired fasting glucose: Secondary | ICD-10-CM | POA: Diagnosis not present

## 2019-04-23 DIAGNOSIS — D509 Iron deficiency anemia, unspecified: Secondary | ICD-10-CM | POA: Diagnosis not present

## 2019-04-23 DIAGNOSIS — I10 Essential (primary) hypertension: Secondary | ICD-10-CM | POA: Diagnosis not present

## 2019-04-23 DIAGNOSIS — Z720 Tobacco use: Secondary | ICD-10-CM | POA: Diagnosis not present

## 2019-04-23 DIAGNOSIS — I5022 Chronic systolic (congestive) heart failure: Secondary | ICD-10-CM | POA: Diagnosis not present

## 2019-04-23 DIAGNOSIS — R600 Localized edema: Secondary | ICD-10-CM | POA: Diagnosis not present

## 2019-04-23 DIAGNOSIS — I739 Peripheral vascular disease, unspecified: Secondary | ICD-10-CM | POA: Diagnosis not present

## 2019-04-23 DIAGNOSIS — M545 Low back pain: Secondary | ICD-10-CM | POA: Diagnosis not present

## 2019-04-23 DIAGNOSIS — K7030 Alcoholic cirrhosis of liver without ascites: Secondary | ICD-10-CM | POA: Diagnosis not present

## 2019-04-23 DIAGNOSIS — G8929 Other chronic pain: Secondary | ICD-10-CM | POA: Diagnosis not present

## 2019-05-11 ENCOUNTER — Encounter: Payer: Self-pay | Admitting: Gastroenterology

## 2019-05-11 ENCOUNTER — Other Ambulatory Visit: Payer: Self-pay

## 2019-05-11 ENCOUNTER — Ambulatory Visit (INDEPENDENT_AMBULATORY_CARE_PROVIDER_SITE_OTHER): Payer: Medicare Other | Admitting: Gastroenterology

## 2019-05-11 VITALS — BP 145/70 | HR 61 | Temp 96.9°F | Ht 63.0 in | Wt 155.8 lb

## 2019-05-11 DIAGNOSIS — K7030 Alcoholic cirrhosis of liver without ascites: Secondary | ICD-10-CM

## 2019-05-11 DIAGNOSIS — I779 Disorder of arteries and arterioles, unspecified: Secondary | ICD-10-CM

## 2019-05-11 NOTE — Progress Notes (Signed)
Roy [name], can we also get Hep A total Antibody, Hep B surface Antibody, Hep B surface antigen, Hep C antibody with his labs in six months.

## 2019-05-11 NOTE — Progress Notes (Signed)
Primary Care Physician: Celene Squibb, MD  Primary Gastroenterologist:  Barney Drain, MD   Chief Complaint  Patient presents with  . Cirrhosis    HPI: Roy [name] is a 73 y.o. male here for follow-up alcoholic cirrhosis.  Last seen in September.  Quit etoh in 2015.  Historically his liver disease has been well compensated.  EGD 2015 with no varices or portal gastropathy.  Right upper quadrant ultrasound April 2020 reports liver to be within normal limits.  Labs up-to-date back in April, MELD 6.   Imaging in 2015/2016 via u/s indicated nodular liver border.   Feels well. No abd pain. No lower ext edema. Appetite good. No melena, brbpr, constipation, diarrhea.   Recent labs in 03/2019 by PCP: WBC 6500, H/H 14/42.8, Platelets 230,000. Creatinine 0.69. Tbili 0.4, AP 84, AST 15, ALT 11, Alb 4.6. TIBC 308, iron 111, iron sat 36%, A1C 6, ferritin 45.     Current Outpatient Medications  Medication Sig Dispense Refill  . acetaminophen (TYLENOL) 500 MG tablet Take 1,000 mg by mouth daily as needed for moderate pain.     [name] aspirin EC 81 MG tablet Take 81 mg by mouth every evening.     [name] atorvastatin (LIPITOR) 20 MG tablet Take 20 mg by mouth daily.     . carvedilol (COREG) 3.125 MG tablet TAKE 1 TABLET TWICE A DAY 180 tablet 0  . clopidogrel (PLAVIX) 75 MG tablet Take 75 mg by mouth every evening.     [name] dextromethorphan-guaiFENesin (MUCINEX DM) 30-600 MG 12hr tablet Take 1 tablet by mouth daily.    [name] ENTRESTO 49-51 MG TAKE 1 TABLET TWICE A DAY 180 tablet 4  . gabapentin (NEURONTIN) 300 MG capsule Take 300 mg by mouth 2 (two) times daily.     . Multiple Vitamins-Minerals (CENTRUM SILVER PO) Take 1 tablet by mouth daily.     . pantoprazole (PROTONIX) 40 MG tablet TAKE 1 TABLET DAILY 90 tablet 4  . Potassium 99 MG TABS Take 99 mg by mouth. Takes Mon, Wed, Fri    . SUPER B COMPLEX/C PO Take 1 tablet by mouth daily.    . tamsulosin (FLOMAX) 0.4 MG CAPS capsule Take 0.4 mg by mouth 2  (two) times daily.     . Tetrahydrozoline HCl (VISINE OP) Place 1 drop into both eyes daily as needed (dry eyes).     No current facility-administered medications for this visit.     Allergies as of 05/11/2019  . (No Known Allergies)    ROS:  General: Negative for anorexia, weight loss, fever, chills, fatigue, weakness. ENT: Negative for hoarseness, difficulty swallowing , nasal congestion. CV: Negative for chest pain, angina, palpitations, dyspnea on exertion, peripheral edema.  Respiratory: Negative for dyspnea at rest, dyspnea on exertion, cough, sputum, wheezing.  GI: See history of present illness. GU:  Negative for dysuria, hematuria, urinary incontinence, urinary frequency, nocturnal urination.  Endo: Negative for unusual weight change.    Physical Examination:   BP (!) 145/70   Pulse 61   Temp (!) 96.9 F (36.1 C) (Oral)   Ht 5\' 3"  (1.6 m)   Wt 155 lb 12.8 oz (70.7 kg)   BMI 27.60 kg/m   General: Well-nourished, well-developed in no acute distress.  Eyes: No icterus. Mouth: Oropharyngeal mucosa moist and pink , no lesions erythema or exudate. Lungs: Clear to auscultation bilaterally.  Heart: Regular rate and rhythm, no murmurs rubs or gallops.  Abdomen: Bowel sounds are normal,  nontender, nondistended, no hepatosplenomegaly or masses, no abdominal bruits or hernia , no rebound or guarding.   Extremities: No lower extremity edema. No clubbing or deformities. Neuro: Alert and oriented x 4   Skin: Warm and dry, no jaundice.   Psych: Alert and cooperative, normal mood and affect.  Labs:  Lab Results  Component Value Date   CREATININE 0.69 (L) 02/26/2019   BUN 7 02/26/2019   NA 139 02/26/2019   K 5.1 02/26/2019   CL 103 02/26/2019   CO2 27 02/26/2019   Lab Results  Component Value Date   WBC 7.6 02/26/2019   HGB 14.2 02/26/2019   HCT 40.6 02/26/2019   MCV 91.6 02/26/2019   PLT 240 02/26/2019   Lab Results  Component Value Date   ALT 12 02/26/2019    AST 17 02/26/2019   ALKPHOS 83 08/13/2017   BILITOT 0.3 02/26/2019   Lab Results  Component Value Date   INR 1.0 02/26/2019   INR 1.0 08/20/2018   INR 1.1 02/17/2018   No results found for: AFP No results found for: HAV, HEPAIGM, HEPBIGM, HEPBCAB, HBEAG, HEPCAB     Imaging Studies: No results found.

## 2019-05-11 NOTE — Patient Instructions (Signed)
1. We will plan for repeat labs in six months along with ultrasound.  2. Return to the office in six months.  3. Call with any questions or concerns.

## 2019-05-11 NOTE — Assessment & Plan Note (Signed)
H/o etoh liver disease diagnosed back in 2015. Liver imaging in 2015/2016 with nodular liver border but no evidence of portal hypertension. Quit etoh in 2015. Subsequent ultrasounds have shown normal hepatic parenchyma. MELD 6. Has done very well from a liver standpoint. We discussed ongoing surveillance every six months given previous concern for cirrhosis. Will update labs and ruq u/s in six months. Consider stretching out hepatoma screening if liver remains normal on u/s. Would consider checking viral hepatitis markers and hep a/b immune status as I can't find in his records. Return ov in six months.

## 2019-05-12 ENCOUNTER — Other Ambulatory Visit: Payer: Self-pay

## 2019-05-12 DIAGNOSIS — K7030 Alcoholic cirrhosis of liver without ascites: Secondary | ICD-10-CM

## 2019-05-12 NOTE — Progress Notes (Signed)
cc'ed to pcp °

## 2019-05-19 NOTE — Progress Notes (Signed)
REVIEWED-SEP 2019 ELASTOGRAPHY SHOWS MINIMAL FIBROSIS. PLT CT IS NORMAL. EGD 2015: NO VARICES. RECOMMEND OPV ONCE A YEAR. NOP NEED FOR REPEAT IMAGING UNLESS PT RESUMES ETOH INTAKE.

## 2019-05-21 ENCOUNTER — Other Ambulatory Visit (HOSPITAL_COMMUNITY): Payer: Self-pay | Admitting: Interventional Radiology

## 2019-05-21 DIAGNOSIS — I771 Stricture of artery: Secondary | ICD-10-CM

## 2019-05-21 NOTE — Progress Notes (Signed)
Please let patient know of SLF recommendations.  Cancel NIC for u/s. Labs as planned.  Return ov with EG in one year.

## 2019-05-21 NOTE — Progress Notes (Signed)
Pt is aware of the plan. Lab orders on file. Forwarding to [facility] to cancel nic for [place] and to nic for OV with EG in one year.

## 2019-06-10 ENCOUNTER — Other Ambulatory Visit: Payer: Self-pay | Admitting: Radiology

## 2019-06-11 ENCOUNTER — Other Ambulatory Visit: Payer: Self-pay | Admitting: Radiology

## 2019-06-12 ENCOUNTER — Other Ambulatory Visit (HOSPITAL_COMMUNITY): Payer: Self-pay | Admitting: Interventional Radiology

## 2019-06-12 ENCOUNTER — Ambulatory Visit (HOSPITAL_COMMUNITY)
Admission: RE | Admit: 2019-06-12 | Discharge: 2019-06-12 | Disposition: A | Discharge status: Home / Self Care | Payer: Medicare Other | Source: Ambulatory Visit | Attending: Interventional Radiology | Admitting: Interventional Radiology

## 2019-06-12 ENCOUNTER — Other Ambulatory Visit: Payer: Self-pay

## 2019-06-12 DIAGNOSIS — Z8249 Family history of ischemic heart disease and other diseases of the circulatory system: Secondary | ICD-10-CM | POA: Insufficient documentation

## 2019-06-12 DIAGNOSIS — I252 Old myocardial infarction: Secondary | ICD-10-CM | POA: Insufficient documentation

## 2019-06-12 DIAGNOSIS — I771 Stricture of artery: Secondary | ICD-10-CM

## 2019-06-12 DIAGNOSIS — Z95820 Peripheral vascular angioplasty status with implants and grafts: Secondary | ICD-10-CM | POA: Insufficient documentation

## 2019-06-12 DIAGNOSIS — Z95810 Presence of automatic (implantable) cardiac defibrillator: Secondary | ICD-10-CM | POA: Insufficient documentation

## 2019-06-12 DIAGNOSIS — Z9852 Vasectomy status: Secondary | ICD-10-CM | POA: Diagnosis not present

## 2019-06-12 DIAGNOSIS — E785 Hyperlipidemia, unspecified: Secondary | ICD-10-CM | POA: Insufficient documentation

## 2019-06-12 DIAGNOSIS — I6603 Occlusion and stenosis of bilateral middle cerebral arteries: Secondary | ICD-10-CM | POA: Diagnosis not present

## 2019-06-12 DIAGNOSIS — I447 Left bundle-branch block, unspecified: Secondary | ICD-10-CM | POA: Diagnosis not present

## 2019-06-12 DIAGNOSIS — Z96611 Presence of right artificial shoulder joint: Secondary | ICD-10-CM | POA: Diagnosis not present

## 2019-06-12 DIAGNOSIS — I6503 Occlusion and stenosis of bilateral vertebral arteries: Secondary | ICD-10-CM | POA: Insufficient documentation

## 2019-06-12 DIAGNOSIS — Z8673 Personal history of transient ischemic attack (TIA), and cerebral infarction without residual deficits: Secondary | ICD-10-CM | POA: Diagnosis not present

## 2019-06-12 DIAGNOSIS — I6521 Occlusion and stenosis of right carotid artery: Secondary | ICD-10-CM | POA: Insufficient documentation

## 2019-06-12 DIAGNOSIS — I739 Peripheral vascular disease, unspecified: Secondary | ICD-10-CM | POA: Diagnosis not present

## 2019-06-12 DIAGNOSIS — I509 Heart failure, unspecified: Secondary | ICD-10-CM | POA: Insufficient documentation

## 2019-06-12 DIAGNOSIS — Z79899 Other long term (current) drug therapy: Secondary | ICD-10-CM | POA: Insufficient documentation

## 2019-06-12 DIAGNOSIS — K7030 Alcoholic cirrhosis of liver without ascites: Secondary | ICD-10-CM | POA: Insufficient documentation

## 2019-06-12 DIAGNOSIS — F17210 Nicotine dependence, cigarettes, uncomplicated: Secondary | ICD-10-CM | POA: Insufficient documentation

## 2019-06-12 DIAGNOSIS — J449 Chronic obstructive pulmonary disease, unspecified: Secondary | ICD-10-CM | POA: Diagnosis not present

## 2019-06-12 DIAGNOSIS — Z7902 Long term (current) use of antithrombotics/antiplatelets: Secondary | ICD-10-CM | POA: Insufficient documentation

## 2019-06-12 DIAGNOSIS — Z7982 Long term (current) use of aspirin: Secondary | ICD-10-CM | POA: Diagnosis not present

## 2019-06-12 DIAGNOSIS — I110 Hypertensive heart disease with heart failure: Secondary | ICD-10-CM | POA: Diagnosis not present

## 2019-06-12 HISTORY — PX: IR US GUIDE VASC ACCESS RIGHT: IMG2390

## 2019-06-12 HISTORY — PX: IR ANGIO INTRA EXTRACRAN SEL COM CAROTID INNOMINATE BILAT MOD SED: IMG5360

## 2019-06-12 HISTORY — PX: IR ANGIO VERTEBRAL SEL SUBCLAVIAN INNOMINATE BILAT MOD SED: IMG5366

## 2019-06-12 LAB — APTT: aPTT: 32 seconds (ref 24–36)

## 2019-06-12 LAB — CBC
HCT: 44.9 % (ref 39.0–52.0)
Hemoglobin: 15.2 g/dL (ref 13.0–17.0)
MCH: 31.5 pg (ref 26.0–34.0)
MCHC: 33.9 g/dL (ref 30.0–36.0)
MCV: 93 fL (ref 80.0–100.0)
Platelets: 228 10*3/uL (ref 150–400)
RBC: 4.83 MIL/uL (ref 4.22–5.81)
RDW: 12 % (ref 11.5–15.5)
WBC: 7.1 10*3/uL (ref 4.0–10.5)
nRBC: 0 % (ref 0.0–0.2)

## 2019-06-12 LAB — BASIC METABOLIC PANEL
Anion gap: 10 (ref 5–15)
BUN: 7 mg/dL — ABNORMAL LOW (ref 8–23)
CO2: 25 mmol/L (ref 22–32)
Calcium: 9.4 mg/dL (ref 8.9–10.3)
Chloride: 100 mmol/L (ref 98–111)
Creatinine, Ser: 0.71 mg/dL (ref 0.61–1.24)
GFR calc Af Amer: >60 mL/min (ref >60)
GFR calc non Af Amer: >60 mL/min (ref >60)
Glucose, Bld: 102 mg/dL — ABNORMAL HIGH (ref 70–99)
Potassium: 3.9 mmol/L (ref 3.5–5.1)
Sodium: 135 mmol/L (ref 135–145)

## 2019-06-12 LAB — PROTIME-INR
INR: 1 (ref 0.8–1.2)
Prothrombin Time: 13.2 seconds (ref 11.4–15.2)

## 2019-06-12 MED ORDER — HEPARIN SODIUM (PORCINE) 1000 UNIT/ML IJ SOLN
INTRAMUSCULAR | Status: AC
  Filled 2019-06-12: qty 1

## 2019-06-12 MED ORDER — IOHEXOL 300 MG/ML  SOLN
150.0000 mL | Freq: Once | INTRAMUSCULAR | Status: AC | PRN
  Administered 2019-06-12: 10:00:00 90 mL via INTRA_ARTERIAL

## 2019-06-12 MED ORDER — NITROGLYCERIN 1 MG/10 ML FOR IR/CATH LAB
INTRA_ARTERIAL | Status: AC
  Filled 2019-06-12: qty 10

## 2019-06-12 MED ORDER — VERAPAMIL HCL 2.5 MG/ML IV SOLN
INTRAVENOUS | Status: AC
  Filled 2019-06-12: qty 2

## 2019-06-12 MED ORDER — MIDAZOLAM HCL 2 MG/2ML IJ SOLN
INTRAMUSCULAR | Status: AC
  Filled 2019-06-12: qty 2

## 2019-06-12 MED ORDER — SODIUM CHLORIDE 0.9 % IV SOLN: Freq: Once | INTRAVENOUS | Status: DC

## 2019-06-12 MED ORDER — LIDOCAINE HCL 1 % IJ SOLN
INTRAMUSCULAR | Status: AC
  Filled 2019-06-12: qty 20

## 2019-06-12 MED ORDER — FENTANYL CITRATE (PF) 100 MCG/2ML IJ SOLN
INTRAMUSCULAR | Status: AC | PRN
  Administered 2019-06-12: 25 ug via INTRAVENOUS

## 2019-06-12 MED ORDER — FENTANYL CITRATE (PF) 100 MCG/2ML IJ SOLN
INTRAMUSCULAR | Status: AC
  Filled 2019-06-12: qty 2

## 2019-06-12 MED ORDER — SODIUM CHLORIDE 0.9 % IV SOLN: INTRAVENOUS | Status: AC

## 2019-06-12 MED ORDER — MIDAZOLAM HCL 2 MG/2ML IJ SOLN
INTRAMUSCULAR | Status: AC | PRN
  Administered 2019-06-12: 1 mg via INTRAVENOUS

## 2019-06-12 NOTE — Discharge Instructions (Signed)
Radial Site Care ° °This sheet gives you information about how to care for yourself after your procedure. Your health care provider may also give you more specific instructions. If you have problems or questions, contact your health care provider. °What can I expect after the procedure? °After the procedure, it is common to have: °· Bruising and tenderness at the catheter insertion area. °Follow these instructions at home: °Medicines °· Take over-the-counter and prescription medicines only as told by your health care provider. °Insertion site care °· Follow instructions from your health care provider about how to take care of your insertion site. Make sure you: °? Wash your hands with soap and water before you change your bandage (dressing). If soap and water are not available, use hand sanitizer. °? Change your dressing as told by your health care provider. °? Leave stitches (sutures), skin glue, or adhesive strips in place. These skin closures may need to stay in place for 2 weeks or longer. If adhesive strip edges start to loosen and curl up, you may trim the loose edges. Do not remove adhesive strips completely unless your health care provider tells you to do that. °· Check your insertion site every day for signs of infection. Check for: °? Redness, swelling, or pain. °? Fluid or blood. °? Pus or a bad smell. °? Warmth. °· Do not take baths, swim, or use a hot tub until your health care provider approves. °· You may shower 24-48 hours after the procedure, or as directed by your health care provider. °? Remove the dressing and gently wash the site with plain soap and water. °? Pat the area dry with a clean towel. °? Do not rub the site. That could cause bleeding. °· Do not apply powder or lotion to the site. °Activity ° °· For 24 hours after the procedure, or as directed by your health care provider: °? Do not flex or bend the affected arm. °? Do not push or pull heavy objects with the affected arm. °? Do not  drive yourself home from the hospital or clinic. You may drive 24 hours after the procedure unless your health care provider tells you not to. °? Do not operate machinery or power tools. °· Do not lift anything that is heavier than 10 lb (4.5 kg), or the limit that you are told, until your health care provider says that it is safe. °· Ask your health care provider when it is okay to: °? Return to work or school. °? Resume usual physical activities or sports. °? Resume sexual activity. °General instructions °· If the catheter site starts to bleed, raise your arm and put firm pressure on the site. If the bleeding does not stop, get help right away. This is a medical emergency. °· If you went home on the same day as your procedure, a responsible adult should be with you for the first 24 hours after you arrive home. °· Keep all follow-up visits as told by your health care provider. This is important. °Contact a health care provider if: °· You have a fever. °· You have redness, swelling, or yellow drainage around your insertion site. °Get help right away if: °· You have unusual pain at the radial site. °· The catheter insertion area swells very fast. °· The insertion area is bleeding, and the bleeding does not stop when you hold steady pressure on the area. °· Your arm or hand becomes pale, cool, tingly, or numb. °These symptoms may represent a serious problem   that is an emergency. Do not wait to see if the symptoms will go away. Get medical help right away. Call your local emergency services (911 in the [place] Do not drive yourself to the hospital. °Summary °· After the procedure, it is common to have bruising and tenderness at the site. °· Follow instructions from your health care provider about how to take care of your radial site wound. Check the wound every day for signs of infection. °· Do not lift anything that is heavier than 10 lb (4.5 kg), or the limit that you are told, until your health care provider says  that it is safe. °This information is not intended to replace advice given to you by your health care provider. Make sure you discuss any questions you have with your health care provider. °Document Released: 12/15/2010 Document Revised: 12/18/2017 Document Reviewed: 12/18/2017 °Elsevier Patient Education © 2020 [employer] ° °

## 2019-06-12 NOTE — H&P (Signed)
Chief Complaint: Patient was seen in consultation today for right ICA stenosis s/p stent placement/diagnostic cerebral arteriogram.  Referring Physician(s): None  Supervising Physician: Luanne Bras  Patient Status: [facility] - Out-[facility]  History of Present Illness: Roy [name] is a 73 y.o. male with a past medical history of hypertension, PVD, MI, LBBB, HF, s/p pacemaker, hyperlipidemia, thrombocytopenia, CVA 6803, COPD, alcoholic cirrhosis, DJD, anxiety, depression, tobacco abuse, and alcohol abuse. He is known to [facility] and has been followed by Dr. Estanislado Pandy since 01/2008. He first presented to our department for management of dizziness secondary to right ICA stenosis. He underwent an image-guided cerebral arteriogram with revascularization of his right ICA stenosis using stent assisted angioplasty 09/02/2008 by Dr. Estanislado Pandy. Following stent placement, he was found to have intrastent stenosis and underwent an image-guided cerebral arteriogram with revascularization of this intrastent stenosis using angioplasty 10/12/2008 by Dr. Estanislado Pandy. He has since been followed with routine diagnostic cerebral arteriograms and imaging scans. His most recent CTA head/neck 08/12/2018 noted stable appearance of right ICA stent, however artifact limited in stent evaluation. Because of this, it was recommended that patient undergo an image-guided diagnostic cerebral arteriogram at next follow-up to evaluate patient's right ICA stent.  CTA head/neck 08/12/2018: 1. Stable appearance of cavernous right internal carotid artery following stenting. Artifact from the stent limits in stent evaluation. Pre and post stent vessel patency is stable. 2. Moderate tandem stenoses in the cavernous left internal carotid artery. 3. Atherosclerotic changes at the aortic arch and bilateral carotid bifurcations without significant stenoses. 4. High-grade stenosis of the dominant left vertebral artery and occluded non dominant right  vertebral artery proximally. Item moderate stenosis of the left vertebral artery at the dural margin. 5. Stable white matter disease.  Patient presents today for an image-guided diagnostic cerebral arteriogram. Patient awake and alert laying in bed watching TV with no complaints at this time. Denies fever, chills, chest pain, dyspnea, abdominal pain, headache, weakness, numbness/tingling, dizziness, or vision changes.  Patient is currently taking Plavix 75 mg once daily and Aspirin 81 mg once daily.   Past Medical History:  Diagnosis Date   AICD (automatic cardioverter/defibrillator) present 08/19/2015   [employer] Jude BiV ICD for primary prevention by Dr. Lovena Le   Alcohol abuse    6 beers per day; hospital admission in 2009 for withdrawal symptoms   Alcoholic cirrhosis ([place])    Anxiety and depression    Cerebrovascular disease 2009   TIA; 2009- right ICA stent; re-intervention for restenosis complicated by [facility] w/o sx   CHF (congestive heart failure) ([place]) 06-02-14   Chronic obstructive pulmonary disease (HCC)    Degenerative joint disease    Total shoulder arthroplasty-right   Hyperlipidemia    Hypertension    LBBB (left bundle branch block)    Normal echo-2011; stress nuclear in 09/2010--septal hypoperfusion representing nontransmural infarction or the effect of left bundle branch block, no ischemia   Myocardial infarction [facility]) June 02, 2014   Massive Heart Attack   Peripheral vascular disease ([place])    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   Thrombocytopenia (HCC)    Tobacco abuse    -100 pack years; 1.5 packs per day   Traumatic seroma of thigh (HCC)    left   Tubular adenoma of colon     Past Surgical History:  Procedure Laterality Date   ABDOMINAL AORTAGRAM [name] 05/04/2014   Procedure: ABDOMINAL Maxcine Ham;  Surgeon: [name], MD;  Location: [facility] CATH LAB;  Service: Cardiovascular;  Laterality: [name];  ABDOMINAL AORTOGRAM [name] 06/25/2017    Procedure: Abdominal Aortogram;  Surgeon: [name], MD;  Location: [place] CV LAB;  Service: Cardiovascular;  Laterality: [name];   ABDOMINAL AORTOGRAM W/LOWER EXTREMITY [name] 04/22/2018   Procedure: ABDOMINAL AORTOGRAM W/LOWER EXTREMITY;  Surgeon: [name], MD;  Location: [place] CV LAB;  Service: Cardiovascular;  Laterality: [name];   AGILE CAPSULE [name] 03/18/2014   Procedure: AGILE CAPSULE;  Surgeon: Danie Binder, MD;  [facility];  Service: Endoscopy;  Laterality: [name];  2:87   APPLICATION OF WOUND VAC Left 09/03/2017   Procedure: APPLICATION OF WOUND VAC;  Surgeon: Conrad Ontonagon, MD;  Location: [place];  Service: Vascular;  Laterality: Left;   BACK SURGERY     BACTERIAL OVERGROWTH TEST [name] 05/24/2015   Procedure: BACTERIAL OVERGROWTH TEST;  Surgeon: Danie Binder, MD;  [facility];  Service: Endoscopy;  Laterality: [name];  0700   BI-VENTRICULAR IMPLANTABLE CARDIOVERTER DEFIBRILLATOR  (CRT-D)  08/19/2015   CARPAL TUNNEL RELEASE Left 02/02/2016   Procedure: LEFT CARPAL TUNNEL RELEASE;  Surgeon: Daryll Brod, MD;  Location: [place];  Service: Orthopedics;  Laterality: Left;  ANESTHESIA: IV REGIONAL UPPER ARM   CATARACT EXTRACTION W/PHACO Right 03/08/2015   Procedure: CATARACT EXTRACTION PHACO AND INTRAOCULAR LENS PLACEMENT (IOC);  Surgeon: Rutherford Guys, MD;  Location: [facility] ORS;  Service: Ophthalmology;  Laterality: Right;  CDE:9.46   CATARACT EXTRACTION W/PHACO Left 03/22/2015   Procedure: CATARACT EXTRACTION PHACO AND INTRAOCULAR LENS PLACEMENT (IOC);  Surgeon: Rutherford Guys, MD;  Location: [facility] OR[facility];  Service: Ophthalmology;  Laterality: Left;  CDE:5.80   COLONOSCOPY  08/22/09   Fields-(Tubular Adenoma)3-mm transverse polyp/4-mm polyp otherwise noraml/small internal hemorrhoids   COLONOSCOPY [name] 04/14/2014   GOT:LXBWI internal hemorrhids/normal mocsa in the terminal iluem/left colonis redundant   COLONOSCOPY W/ POLYPECTOMY  2011    ENDARTERECTOMY FEMORAL Left 08/16/2017   Procedure: ENDARTERECTOMY LEFT PROFUNDA FEMORAL;  Surgeon: [name], MD;  Location: [place];  Service: Vascular;  Laterality: Left;   EP IMPLANTABLE DEVICE [name] 08/19/2015   Procedure: BiV ICD Insertion CRT-D;  Surgeon: Evans Lance, MD;  Location: [name] CV LAB;  Service: Cardiovascular;  Laterality: [name];   ESOPHAGOGASTRODUODENOSCOPY [name] 03/05/2014   SLF: 1. Stricture at the gastroesophagael junction 2. Small hiatal hernia 3. Moderate non-erosive gastritis and duodentitis. 4. No surce for Melena identified.    FEMORAL-POPLITEAL BYPASS GRAFT Left 08/16/2017   Procedure: LEFT  FEMORAL-POPLITEAL ARTERY  BYPASS GRAFT;  Surgeon: [name], MD;  Location: [place];  Service: Vascular;  Laterality: Left;   GIVENS CAPSULE STUDY [name] 03/30/2014   Procedure: GIVENS CAPSULE STUDY;  Surgeon: Danie Binder, MD;  [facility];  Service: Endoscopy;  Laterality: [name];  7:30   I&D EXTREMITY Left 09/03/2017   Procedure: IRRIGATION AND DEBRIDEMENT EXTREMITY LEFT THIGH SEROMA;  Surgeon: Conrad Owyhee, MD;  Location: [facility] OR;  Service: Vascular;  Laterality: Left;   IR ANGIO INTRA EXTRACRAN SEL COM CAROTID INNOMINATE BILAT MOD SED  07/16/2017   IR ANGIO VERTEBRAL SEL SUBCLAVIAN INNOMINATE BILAT MOD SED  07/16/2017   IR RADIOLOGIST EVAL & MGMT  04/01/2017   IR RADIOLOGIST EVAL & MGMT  08/02/2017   JOINT REPLACEMENT Right    LOWER EXTREMITY ANGIOGRAPHY Bilateral 06/25/2017   Procedure: Lower Extremity Angiography;  Surgeon: [name], MD;  Location: [place] CV LAB;  Service: Cardiovascular;  Laterality: Bilateral;   LUMBAR FUSION  2010   PERIPHERAL VASCULAR  ATHERECTOMY Right 04/22/2018   Procedure: PERIPHERAL VASCULAR ATHERECTOMY;  Surgeon: [name], MD;  Location: [place] CV LAB;  Service: Cardiovascular;  Laterality: Right;  right superficial femoral   PERIPHERAL VASCULAR BALLOON ANGIOPLASTY Right 04/22/2018   Procedure:  PERIPHERAL VASCULAR BALLOON ANGIOPLASTY;  Surgeon: [name], MD;  Location: [place] CV LAB;  Service: Cardiovascular;  Laterality: Right;  external iliac   TOTAL SHOULDER ARTHROPLASTY Right 2011   Dr. Rafael Bihari NERVE TRANSPOSITION Left 02/02/2016   Procedure: LEFT IN-SITU DECOMPRESSION ULNAR NERVE ;  Surgeon: Daryll Brod, MD;  Location: [place];  Service: Orthopedics;  Laterality: Left;   ULNAR TUNNEL RELEASE Left 02/02/2016   Procedure: LEFT CUBITAL TUNNEL RELEASE;  Surgeon: Daryll Brod, MD;  Location: [place];  Service: Orthopedics;  Laterality: Left;   VASECTOMY  1971   VEIN HARVEST Left 08/16/2017   Procedure: USING NON REVERSE LEFT GREATER SAPHENOUS VEIN HARVEST;  Surgeon: [name], MD;  Location: [place];  Service: Vascular;  Laterality: Left;    Allergies: Patient has no known allergies.  Medications: Prior to Admission medications   Medication Sig Start Date End Date Taking? Authorizing Provider  acetaminophen (TYLENOL) 500 MG tablet Take 1,000 mg by mouth daily as needed for moderate pain.    Yes [provider]  aspirin EC 81 MG tablet Take 81 mg by mouth every evening.    Yes [provider]  atorvastatin (LIPITOR) 20 MG tablet Take 20 mg by mouth daily.    Yes [provider]  carvedilol (COREG) 3.125 MG tablet TAKE 1 TABLET TWICE A DAY 04/21/19  Yes Herminio Commons, MD  clopidogrel (PLAVIX) 75 MG tablet Take 75 mg by mouth every evening.    Yes [provider]  dextromethorphan-guaiFENesin (MUCINEX DM) 30-600 MG 12hr tablet Take 1 tablet by mouth every evening.    Yes [provider]  ENTRESTO 49-51 MG TAKE 1 TABLET TWICE A DAY 10/27/18  Yes Herminio Commons, MD  Ferrous Sulfate 27 MG TABS Take 27 mg by mouth every Monday, Wednesday, and Friday.   Yes [provider]  gabapentin (NEURONTIN) 300 MG capsule Take 300 mg by mouth 2 (two) times daily.  06/14/15  Yes  [provider]  Multiple Vitamins-Minerals (CENTRUM SILVER PO) Take 1 tablet by mouth daily.    Yes [provider]  pantoprazole (PROTONIX) 40 MG tablet TAKE 1 TABLET DAILY 06/26/18  Yes Annitta Needs, NP  Potassium 99 MG TABS Take 99 mg by mouth every Monday, Wednesday, and Friday. Takes Mon, Wed, Fri   Yes [provider]  SUPER B COMPLEX/C PO Take 1 tablet by mouth daily.   Yes [provider]  tamsulosin (FLOMAX) 0.4 MG CAPS capsule Take 0.4 mg by mouth 2 (two) times daily.    Yes [provider]  Tetrahydrozoline HCl (VISINE OP) Place 1 drop into both eyes daily as needed (dry eyes).   Yes [provider]     Family History  Problem Relation Age of Onset   Coronary artery disease Mother    Diabetes Mother    Heart disease Mother        Before age 77 - 60 Bypasses   Hypertension Mother    Heart attack Mother        3-4 Heart attacks   Alzheimer's disease Father    Diabetes Father    Heart attack Sister    Cancer Brother  Prostate   Hyperlipidemia Son    Prostate cancer Brother    Alzheimer's disease Sister    Colon cancer Neg Hx     Social History   Socioeconomic History   Marital status: Married    Spouse name: Not on file   Number of children: 2   Years of education: Not on file   Highest education level: Not on file  Occupational History   Occupation: retired, Clinical biochemist    Comment: Programmer, systems: [place] resource strain: Not on file   Food insecurity    Worry: Not on file    Inability: Not on file   Transportation needs    Medical: Not on file    Non-medical: Not on file  Tobacco Use   Smoking status: Current Every Day Smoker    Packs/day: 1.00    Years: 60.00    Pack years: 60.00    Types: Cigarettes    Start date: 08/20/1957   Smokeless tobacco: Never Used  Substance and Sexual Activity   Alcohol use: No    Alcohol/week: 0.0  standard drinks    Comment: quit in July 2015   Drug use: No   Sexual activity: Never  Lifestyle   Physical activity    Days per week: Not on file    Minutes per session: Not on file   Stress: Not on file  Relationships   Social connections    Talks on phone: Not on file    Gets together: Not on file    Attends religious service: Not on file    Active member of club or organization: Not on file    Attends meetings of clubs or organizations: Not on file    Relationship status: Not on file  Other Topics Concern   Not on file  Social History Narrative   Accompanied by daughter [name]   Lives w/ wife, daughter     Review of Systems: A 12 point ROS discussed and pertinent positives are indicated in the HPI above.  All other systems are negative.  Review of Systems  Constitutional: Negative for chills and fever.  Eyes: Negative for visual disturbance.  Respiratory: Negative for shortness of breath and wheezing.   Cardiovascular: Negative for chest pain and palpitations.  Gastrointestinal: Negative for abdominal pain.  Neurological: Negative for dizziness, weakness, numbness and headaches.  Psychiatric/Behavioral: Negative for behavioral problems and confusion.    Vital Signs: BP (!) 133/58    Pulse (!) 59    Temp 97.8 F (36.6 C) (Oral)    Resp 19    Ht 5\' 3"  (1.6 m)    Wt 159 lb (72.1 kg)    SpO2 100%    BMI 28.17 kg/m   Physical Exam Vitals signs and nursing note reviewed.  Constitutional:      General: He is not in acute distress.    Appearance: Normal appearance.  Cardiovascular:     Rate and Rhythm: Normal rate and regular rhythm.     Heart sounds: Normal heart sounds. No murmur.  Pulmonary:     Effort: Pulmonary effort is normal. No respiratory distress.     Breath sounds: Normal breath sounds. No wheezing.  Abdominal:     Palpations: Abdomen is soft.     Tenderness: There is no abdominal tenderness.  Skin:    General: Skin is warm and dry.    Neurological:     Mental Status: He is alert and oriented  to person, place, and time.  Psychiatric:        Mood and Affect: Mood normal.        Behavior: Behavior normal.        Thought Content: Thought content normal.        Judgment: Judgment normal.      MD Evaluation Airway: WNL Heart: WNL Abdomen: WNL Chest/ Lungs: WNL ASA  Classification: 3 Mallampati/Airway Score: Two   Imaging: No results found.  Labs:  CBC: Recent Labs    08/20/18 1033 02/26/19 0855 06/12/19 0639  WBC 6.9 7.6 7.1  HGB 14.2 14.2 15.2  HCT 40.9 40.6 44.9  PLT 252 240 228    COAGS: Recent Labs    08/20/18 1033 02/26/19 0855 06/12/19 0639  INR 1.0 1.0 1.0  APTT  --   --  32    BMP: Recent Labs    08/12/18 1627 08/20/18 1033 02/26/19 0855  NA  --  137 139  K  --  4.0 5.1  CL  --  100 103  CO2  --  31 27  GLUCOSE  --  67 96  BUN  --  8 7  CALCIUM  --  9.3 9.5  CREATININE 0.70 0.67* 0.69*    LIVER FUNCTION TESTS: Recent Labs    08/20/18 1033 02/26/19 0855  BILITOT 0.4 0.3  AST 16 17  ALT 11 12  PROT 6.8 6.8    TUMOR MARKERS: Recent Labs    08/20/18 1033 02/26/19 0855  AFPTM 1.6 1.4    Assessment and Plan:  Right ICA stenosis s/p stent assisted angioplasty 09/02/2008 by Dr. Estanislado Pandy, s/p angioplasty of intrastent stenosis 10/12/2008. Plan for image-guided diagnostic cerebral arteriogram today with Dr. Estanislado Pandy. Patient is NPO. Afebrile and WBCs WNL. Ok to proceed with Plavix/Aspirin use per Dr. Estanislado Pandy. INR 1.0 today.  Risks and benefits of cerebral arteriogram were discussed with the patient including, but not limited to bleeding, infection, vascular injury or contrast induced renal failure. This interventional procedure involves the use of X-rays and because of the nature of the planned procedure, it is possible that we will have prolonged use of X-ray fluoroscopy. Potential radiation risks to you include (but are not limited to) the following: - A  slightly elevated risk for cancer  several years later in life. This risk is typically less than 0.5% percent. This risk is low in comparison to the normal incidence of human cancer, which is 33% for women and 50% for men according to the [employer]. - Radiation induced injury can include skin redness, resembling a rash, tissue breakdown / ulcers and hair loss (which can be temporary or permanent).  The likelihood of either of these occurring depends on the difficulty of the procedure and whether you are sensitive to radiation due to previous procedures, disease, or genetic conditions.  IF your procedure requires a prolonged use of radiation, you will be notified and given written instructions for further action.  It is your responsibility to monitor the irradiated area for the 2 weeks following the procedure and to notify your physician if you are concerned that you have suffered a radiation induced injury.   All of the patient's questions were answered, patient is agreeable to proceed. Consent signed and in chart.   Thank you for this interesting consult.  I greatly enjoyed meeting AVA DEGUIRE and look forward to participating in their care.  A copy of this report was sent to the requesting provider on this date.  Electronically Signed: Earley Abide, PA-C 06/12/2019, 7:33 AM   I spent a total of 40 Minutes in face to face in clinical consultation, greater than 50% of which was counseling/coordinating care for right ICA stenosis s/p stent placement/diagnostic cerebral arteriogram.

## 2019-06-12 NOTE — Procedures (Signed)
S/P  4 vessel cerebral arteriogram RT Radial approach. Findings. 1.Occluded RT VA origin with partial distal reconstitution from ascending cervical branch at C2. 2.Preocclusive stenosis of RT VA origin with retrograde filling from ascending cerival branch. 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

## 2019-06-15 ENCOUNTER — Encounter (HOSPITAL_COMMUNITY): Payer: Self-pay | Admitting: Interventional Radiology

## 2019-06-16 ENCOUNTER — Telehealth (HOSPITAL_COMMUNITY): Payer: Self-pay | Admitting: Radiology

## 2019-06-16 ENCOUNTER — Other Ambulatory Visit (HOSPITAL_COMMUNITY): Payer: Self-pay | Admitting: Interventional Radiology

## 2019-06-16 DIAGNOSIS — I6502 Occlusion and stenosis of left vertebral artery: Secondary | ICD-10-CM

## 2019-06-16 DIAGNOSIS — I771 Stricture of artery: Secondary | ICD-10-CM

## 2019-06-16 NOTE — Telephone Encounter (Signed)
Called pt, left VM for him to call to set up his LVA stenosis tx with Deveshwar. JM

## 2019-06-25 ENCOUNTER — Other Ambulatory Visit (HOSPITAL_COMMUNITY): Payer: Medicare Other

## 2019-06-27 ENCOUNTER — Other Ambulatory Visit (HOSPITAL_COMMUNITY)
Admission: RE | Admit: 2019-06-27 | Discharge: 2019-06-27 | Disposition: A | Discharge status: Home / Self Care | Payer: Medicare Other | Source: Ambulatory Visit | Attending: Interventional Radiology | Admitting: Interventional Radiology

## 2019-06-27 DIAGNOSIS — Z20828 Contact with and (suspected) exposure to other viral communicable diseases: Secondary | ICD-10-CM | POA: Diagnosis not present

## 2019-06-27 DIAGNOSIS — Z01812 Encounter for preprocedural laboratory examination: Secondary | ICD-10-CM | POA: Diagnosis not present

## 2019-06-27 LAB — SARS CORONAVIRUS 2 (TAT 6-24 HRS): SARS Coronavirus 2: NEGATIVE

## 2019-06-29 ENCOUNTER — Ambulatory Visit (HOSPITAL_COMMUNITY): Admission: RE | Admit: 2019-06-29 | Payer: Medicare Other | Source: Ambulatory Visit

## 2019-06-30 ENCOUNTER — Other Ambulatory Visit: Payer: Self-pay

## 2019-06-30 ENCOUNTER — Encounter (HOSPITAL_COMMUNITY): Payer: Self-pay | Admitting: *Deleted

## 2019-06-30 ENCOUNTER — Other Ambulatory Visit: Payer: Self-pay | Admitting: Student

## 2019-06-30 NOTE — Anesthesia Preprocedure Evaluation (Addendum)
Anesthesia Evaluation    Reviewed: Allergy & Precautions, Patient's Chart, lab work & pertinent test results  Airway        Dental   Pulmonary COPD, Current Smoker,  100 pack year hx          Cardiovascular hypertension, + Past MI and + Peripheral Vascular Disease  + dysrhythmias + pacemaker + Cardiac Defibrillator   Left ventricle:  The cavity size was normal. Wall thickness was normal. Systolic function was normal. The estimated ejection fraction was in the range of 50% to 55%. Wall motion was normal; there were no regional wall motion abnormalities. Doppler parameters are consistent with abnormal left ventricular relaxation (grade 1 diastolic dysfunction). Indeterminate filling pressures   Neuro/Psych    GI/Hepatic   Endo/Other    Renal/GU Renal disease     Musculoskeletal   Abdominal   Peds  Hematology  (+) anemia ,   Anesthesia Other Findings   Reproductive/Obstetrics                            Lab Results  Component Value Date   WBC 7.1 07/01/2019   HGB 13.8 07/01/2019   HCT 41.1 07/01/2019   MCV 93.0 07/01/2019   PLT 239 07/01/2019    Lab Results  Component Value Date   CREATININE 0.73 07/01/2019   BUN 10 07/01/2019   NA 135 07/01/2019   K 3.8 07/01/2019   CL 99 07/01/2019   CO2 24 07/01/2019    Anesthesia Physical Anesthesia Plan  ASA: III  Anesthesia Plan: General   Post-op Pain Management:    Induction: Intravenous, Rapid sequence and Cricoid pressure planned  PONV Risk Score and Plan: 2 and Ondansetron, Treatment may vary due to age or medical condition and Dexamethasone  Airway Management Planned: Video Laryngoscope Planned and Oral ETT  Additional Equipment: Arterial line  Intra-op Plan:   Post-operative Plan: Possible Post-op intubation/ventilation  Informed Consent:   Plan Discussed with:   Anesthesia Plan Comments: (PAT note written 06/30/2019  by Myra Gianotti, PA-C. )       Anesthesia Quick Evaluation

## 2019-06-30 NOTE — Progress Notes (Signed)
[name] denies chest pain or shortness of breath. Patient tested negative for Covid on 06/27/2019, he has been in quarantine with family since that time. PCP is Dr Casimer Leek. Cardiologist is Dr Bronson Ing and Dr Lovena Le for ICD/PaceMaker.

## 2019-06-30 NOTE — Progress Notes (Signed)
Anesthesia Chart Review: Roy [name]   Case: [id number] Date/Time: 07/01/19 0815   Procedure: STENTING ([name] )   Anesthesia type: General   Pre-op diagnosis: STENOSIS   Location: [facility] RADIOLOGY ROOM / [place] OR   Surgeon: Luanne Bras, MD      DISCUSSION: Patient is a 73 year old male scheduled for image guided cerebral arteriogram with possible revascularization/angioplasty/stent of left vertebral artery stenosis on 07/01/19 by Luanne Bras, MD.  History includes CAD (NSTEMI, EF 20% 06/02/14, LHC not pursued due to AKI and mental status changes; NST 08/30/14: large inferior infarct, no ischemia, medical management; EF 50-55% 01/30/61), chronic systolic CHF, AICD (St. Jude BiV, implanted 08/19/15), LBBB, TIA (2009), right ICA stenosis (s/p stent assisted angioplasty 09/02/08, 10/12/08), HTN, HLD, alcoholic cirrhosis (sober since 05/2014), thrombocytopenia (remote history), COPD, smoking, PVD (s/p bilateral EIA stents 05/04/14; s/p left profunda femoral and left EIA endarterectomies, FPBG with ipsilateral GSV graft 08/16/17; s/p right SFA atherectomy/angioplasty, right EIA angioplasty 04/22/18).  Patient last seen by EP cardiology 12/10/18 and primary cardiology 07/15/18. No medication changes made. Multiple notes indicate he is not interested in smoking cessation. Normal ICD remote transmission 04/06/19. He denied chest pain and SOB per PAT RN phone interview. He is a same day work-up, so further evaluation by his anesthesia team on the day of surgery. COVID-19 test negative on 06/27/19. He is for labs and EKG on arrival. He is on ASA and Plavix.    PROVIDERS: Celene Squibb, MD is PCP   - Kate Sable, MD is cardiologist. Last visit 07/15/18.  CAD felt stable.  No changes in management made.  Patient had no desire to quit smoking.  One-year follow-up recommended.  Cristopher Peru, MD is EP cardiologist. Last visit 12/10/18.  ICD working normally.  No change and CHF medications made.  - Brabham, V.  Rock Nephew, MD is vascular surgeon. Last visit 01/26/19.  Patient overall asymptomatic from PVD.  64-month follow-up recommended.  Barney Drain, MD is GI. Last seen 05/11/19 by Neil Crouch, PA-C. She wrote, "Historically his liver disease has been well compensated.  EGD 2015 with no varices or portal gastropathy.  Right upper quadrant ultrasound April 2020 reports liver to be within normal limits.  Labs up-to-date back in April, MELD 6." Six month follow-up planned.   LABS: He is for updated labs on arrival per IR.   IMAGES: Bilateral CCA and innominate angiography 06/11/18: IMPRESSION:  - Occluded right vertebral artery at its origin with partial  reconstitution at the level of C1 from the ipsilateral ascending  cervical branch of the thyrocervical trunk.  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  - Approximately 70-75% intra stent of the caval cavernous segment of  the right internal carotid artery stenosis within the previously  positioned Wingspan stent.  - Non opacification of the right anterior cerebral artery A1 segment.  This might be related to agenesis versus pathologic occlusion  related to intracranial arteriosclerosis.   CTA head/neck 08/12/18: IMPRESSION: 1. Stable appearance of cavernous right internal carotid artery following stenting. Artifact from the stent limits in stent evaluation. Pre and post stent vessel patency is stable. 2. Moderate tandem stenoses in the cavernous left internal carotid artery. 3. Atherosclerotic changes at the aortic arch and bilateral carotid bifurcations without significant stenoses. 4. High-grade stenosis of the dominant  left vertebral artery and occluded non dominant right vertebral artery proximally. Item moderate stenosis of the left vertebral  artery at the dural margin. 5. Stable white matter disease.   EKG: He will need updated EKG on the day of surgery.  04/22/2018 tracing showed atrial sensed ventricular paced rhythm.   CV: Echo 08/01/17:  Study Conclusions: - Left ventricle: The cavity size was normal. Wall thickness was normal. Systolic function was normal. The estimated ejectionfraction was in the range of 50% to 55%. Wall motion was normal;there were no regional wall motion abnormalities. Dopplerparameters are consistent with abnormal left ventricularrelaxation (grade 1 diastolic dysfunction). Indeterminate fillingpressures. - Aortic valve: Trileaflet; mildly thickened leaflets. - Mitral valve: Calcified annulus. Normal thickness leaflets.There was mild regurgitation. - Atrial septum: No defect or patent foramen ovale was identified. - Impressions: When compared to the study dated 1/61/09, LV systolic function has since normalized. (Comparison: EF 35% 07/14/15, "LVEF of 20%" in setting of NSTEMI 05/2014)  Carotid duplex 06/12/17:  Impression: 1. 1-39% B ICA stenoses 2. Patent distal LICA stent with no evidence restenosis  Nuclear stress test 08/30/14 (according to 11/02/14 note by Dr. Bronson Ing) "demonstrated a large area of inferior myocardial scar with no evidence of ischemia, and inferior akinesis."   Past Medical History:  Diagnosis Date  . AICD (automatic cardioverter/defibrillator) present 08/19/2015   St Jude BiV ICD for primary prevention by Dr. Lovena Le  . Alcohol abuse    6 beers per day; hospital admission in 2009 for withdrawal symptoms  . Alcoholic cirrhosis ([place])   . Anxiety and depression    denies   . Cerebrovascular disease 2009   TIA; 2009- right ICA stent; re-intervention for restenosis complicated by [facility] w/o sx  . CHF (congestive heart failure) ([place]) 06-02-14  . Chronic obstructive pulmonary disease ([place])   . Degenerative joint disease    Total shoulder arthroplasty-right  . Hyperlipidemia    . Hypertension   . LBBB (left bundle branch block)    Normal echo-2011; stress nuclear in 09/2010--septal hypoperfusion representing nontransmural infarction or the effect of left bundle branch block, no ischemia  . Myocardial infarction [facility]) June 02, 2014   Massive Heart Attack  . Peripheral vascular disease ([place])   . 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  . Thrombocytopenia ([place])   . Tobacco abuse    -100 pack years; 1.5 packs per day  . Traumatic seroma of thigh ([place])    left  . Tubular adenoma of colon     Past Surgical History:  Procedure Laterality Date  . ABDOMINAL AORTAGRAM [name] 05/04/2014   Procedure: ABDOMINAL Maxcine Ham;  Surgeon: [name], MD;  Location: [facility] CATH LAB;  Service: Cardiovascular;  Laterality: [name];  . ABDOMINAL AORTOGRAM [name] 06/25/2017   Procedure: Abdominal Aortogram;  Surgeon: [name], MD;  Location: [place] CV LAB;  Service: Cardiovascular;  Laterality: [name];  . ABDOMINAL AORTOGRAM W/LOWER EXTREMITY [name] 04/22/2018   Procedure: ABDOMINAL AORTOGRAM W/LOWER EXTREMITY;  Surgeon: [name], MD;  Location: [place] CV LAB;  Service: Cardiovascular;  Laterality: [name];  . AGILE CAPSULE [name] 03/18/2014   Procedure: AGILE CAPSULE;  Surgeon: Danie Binder, MD;  Location: [facility] ENDO SUITE;  Service: Endoscopy;  Laterality: [name];  7:30  . APPLICATION OF WOUND VAC Left 09/03/2017   Procedure: APPLICATION OF WOUND VAC;  Surgeon: Conrad Westmont, MD;  Location: [place];  Service: Vascular;  Laterality: Left;  . BACTERIAL OVERGROWTH TEST [name] 05/24/2015   Procedure: BACTERIAL OVERGROWTH TEST;  Surgeon: Danie Binder, MD;  [facility]: [facility];  Service: Endoscopy;  Laterality: [name];  0700  . BI-VENTRICULAR IMPLANTABLE CARDIOVERTER DEFIBRILLATOR  (CRT-D)  08/19/2015  . CARPAL TUNNEL RELEASE Left 02/02/2016   Procedure: LEFT CARPAL TUNNEL RELEASE;  Surgeon: Daryll Brod, MD;  Location: [place];  Service: Orthopedics;  Laterality: Left;   ANESTHESIA: IV REGIONAL UPPER ARM  . CATARACT EXTRACTION W/PHACO Right 03/08/2015   Procedure: CATARACT EXTRACTION PHACO AND INTRAOCULAR LENS PLACEMENT (IOC);  Surgeon: Rutherford Guys, MD;  Location: [facility] ORS;  Service: Ophthalmology;  Laterality: Right;  CDE:9.46  . CATARACT EXTRACTION W/PHACO Left 03/22/2015   Procedure: CATARACT EXTRACTION PHACO AND INTRAOCULAR LENS PLACEMENT (IOC);  Surgeon: Rutherford Guys, MD;  Location: [facility] ORS;  Service: Ophthalmology;  Laterality: Left;  CDE:5.80  . COLONOSCOPY  08/22/09   Fields-(Tubular Adenoma)3-mm transverse polyp/4-mm polyp otherwise noraml/small internal hemorrhoids  . COLONOSCOPY [name] 04/14/2014   VQQ:VZDGL internal hemorrhids/normal mocsa in the terminal iluem/left colonis redundant  . COLONOSCOPY W/ POLYPECTOMY  2011  . ENDARTERECTOMY FEMORAL Left 08/16/2017   Procedure: ENDARTERECTOMY LEFT PROFUNDA FEMORAL;  Surgeon: [name], MD;  Location: [place];  Service: Vascular;  Laterality: Left;  . EP IMPLANTABLE DEVICE [name] 08/19/2015   Procedure: BiV ICD Insertion CRT-D;  Surgeon: Evans Lance, MD;  Location: [place] CV LAB;  Service: Cardiovascular;  Laterality: [name];  . ESOPHAGOGASTRODUODENOSCOPY [name] 03/05/2014   SLF: 1. Stricture at the gastroesophagael junction 2. Small hiatal hernia 3. Moderate non-erosive gastritis and duodentitis. 4. No surce for Melena identified.   . FEMORAL-POPLITEAL BYPASS GRAFT Left 08/16/2017   Procedure: LEFT  FEMORAL-POPLITEAL ARTERY  BYPASS GRAFT;  Surgeon: [name], MD;  Location: [place];  Service: Vascular;  Laterality: Left;  . GIVENS CAPSULE STUDY [name] 03/30/2014   Procedure: GIVENS CAPSULE STUDY;  Surgeon: Danie Binder, MD;  [facility];  Service: Endoscopy;  Laterality: [name];  7:30  . I&D EXTREMITY Left 09/03/2017   Procedure: IRRIGATION AND DEBRIDEMENT EXTREMITY LEFT THIGH SEROMA;  Surgeon: Conrad Chicken, MD;  Location[facility];  Service: Vascular;  Laterality: Left;  . IR ANGIO INTRA EXTRACRAN SEL  COM CAROTID INNOMINATE BILAT MOD SED  07/16/2017  . IR ANGIO INTRA EXTRACRAN SEL COM CAROTID INNOMINATE BILAT MOD SED  06/12/2019  . IR ANGIO VERTEBRAL SEL SUBCLAVIAN INNOMINATE BILAT MOD SED  07/16/2017  . IR ANGIO VERTEBRAL SEL SUBCLAVIAN INNOMINATE BILAT MOD SED  06/12/2019  . IR RADIOLOGIST EVAL & MGMT  04/01/2017  . IR RADIOLOGIST EVAL & MGMT  08/02/2017  . IR US GUIDE VASC ACCESS RIGHT  06/12/2019  . LOWER EXTREMITY ANGIOGRAPHY Bilateral 06/25/2017   Procedure: Lower Extremity Angiography;  Surgeon: [name], MD;  Location: [place] CV LAB;  Service: Cardiovascular;  Laterality: Bilateral;  . LUMBAR FUSION  2010  . PERIPHERAL VASCULAR ATHERECTOMY Right 04/22/2018   Procedure: PERIPHERAL VASCULAR ATHERECTOMY;  Surgeon: [name], MD;  Location: [place] CV LAB;  Service: Cardiovascular;  Laterality: Right;  right superficial femoral  . PERIPHERAL VASCULAR BALLOON ANGIOPLASTY Right 04/22/2018   Procedure: PERIPHERAL VASCULAR BALLOON ANGIOPLASTY;  Surgeon: [name], MD;  Location: [place] CV LAB;  Service: Cardiovascular;  Laterality: Right;  external iliac  . TOTAL SHOULDER ARTHROPLASTY Right 2011   Dr. Tamera Punt  . ULNAR NERVE TRANSPOSITION Left 02/02/2016   Procedure: LEFT IN-SITU DECOMPRESSION ULNAR NERVE ;  Surgeon: Daryll Brod, MD;  Location: [place];  Service: Orthopedics;  Laterality: Left;  . ULNAR TUNNEL RELEASE Left 02/02/2016   Procedure: LEFT CUBITAL TUNNEL RELEASE;  Surgeon: Daryll Brod, MD;  Location: [place];  Service: Orthopedics;  Laterality: Left;  [name] VASECTOMY  1971  . VEIN HARVEST Left 08/16/2017   Procedure: USING NON REVERSE LEFT GREATER SAPHENOUS VEIN HARVEST;  Surgeon: [name], MD;  Location: [facility] OR;  Service: Vascular;  Laterality: Left;    MEDICATIONS: No current facility-administered medications for this encounter.    [name] acetaminophen (TYLENOL) 500 MG tablet  . aspirin EC 81 MG tablet  . atorvastatin  (LIPITOR) 20 MG tablet  . carvedilol (COREG) 3.125 MG tablet  . clopidogrel (PLAVIX) 75 MG tablet  . dextromethorphan-guaiFENesin (MUCINEX DM) 30-600 MG 12hr tablet  . ENTRESTO 49-51 MG  . Ferrous Sulfate 27 MG TABS  . gabapentin (NEURONTIN) 300 MG capsule  . Multiple Vitamins-Minerals (CENTRUM SILVER PO)  . pantoprazole (PROTONIX) 40 MG tablet  . Potassium 99 MG TABS  . SUPER B COMPLEX/C PO  . tamsulosin (FLOMAX) 0.4 MG CAPS capsule  . Tetrahydrozoline HCl (VISINE OP)     Myra Gianotti, PA-C Surgical Short Stay/Anesthesiology [facility] Phone [phone] [facility] Phone [phone] 06/30/2019 2:48 PM

## 2019-07-01 ENCOUNTER — Encounter (HOSPITAL_COMMUNITY)
Admission: RE | Disposition: A | Discharge status: Home / Self Care | Payer: Self-pay | Source: Home / Self Care | Attending: Interventional Radiology

## 2019-07-01 ENCOUNTER — Ambulatory Visit (HOSPITAL_COMMUNITY)
Admission: RE | Admit: 2019-07-01 | Discharge: 2019-07-01 | Disposition: A | Discharge status: Home / Self Care | Payer: Medicare Other | Attending: Interventional Radiology | Admitting: Interventional Radiology

## 2019-07-01 ENCOUNTER — Encounter (HOSPITAL_COMMUNITY): Payer: Self-pay | Admitting: Certified Registered Nurse Anesthetist

## 2019-07-01 ENCOUNTER — Encounter (HOSPITAL_COMMUNITY): Payer: Self-pay

## 2019-07-01 ENCOUNTER — Ambulatory Visit (HOSPITAL_COMMUNITY)
Admission: RE | Admit: 2019-07-01 | Discharge: 2019-07-01 | Disposition: A | Discharge status: Home / Self Care | Payer: Medicare Other | Source: Ambulatory Visit | Attending: Interventional Radiology | Admitting: Interventional Radiology

## 2019-07-01 DIAGNOSIS — I6502 Occlusion and stenosis of left vertebral artery: Secondary | ICD-10-CM | POA: Insufficient documentation

## 2019-07-01 DIAGNOSIS — Z5309 Procedure and treatment not carried out because of other contraindication: Secondary | ICD-10-CM | POA: Diagnosis not present

## 2019-07-01 HISTORY — PX: RADIOLOGY WITH ANESTHESIA: SHX6223

## 2019-07-01 LAB — BASIC METABOLIC PANEL
Anion gap: 12 (ref 5–15)
BUN: 10 mg/dL (ref 8–23)
CO2: 24 mmol/L (ref 22–32)
Calcium: 9.1 mg/dL (ref 8.9–10.3)
Chloride: 99 mmol/L (ref 98–111)
Creatinine, Ser: 0.73 mg/dL (ref 0.61–1.24)
GFR calc Af Amer: >60 mL/min (ref >60)
GFR calc non Af Amer: >60 mL/min (ref >60)
Glucose, Bld: 108 mg/dL — ABNORMAL HIGH (ref 70–99)
Potassium: 3.8 mmol/L (ref 3.5–5.1)
Sodium: 135 mmol/L (ref 135–145)

## 2019-07-01 LAB — APTT: aPTT: 32 seconds (ref 24–36)

## 2019-07-01 LAB — CBC WITH DIFFERENTIAL/PLATELET
Abs Immature Granulocytes: 0.02 10*3/uL (ref 0.00–0.07)
Basophils Absolute: 0.1 10*3/uL (ref 0.0–0.1)
Basophils Relative: 1 %
Eosinophils Absolute: 0.4 10*3/uL (ref 0.0–0.5)
Eosinophils Relative: 5 %
HCT: 41.1 % (ref 39.0–52.0)
Hemoglobin: 13.8 g/dL (ref 13.0–17.0)
Immature Granulocytes: 0 %
Lymphocytes Relative: 17 %
Lymphs Abs: 1.2 10*3/uL (ref 0.7–4.0)
MCH: 31.2 pg (ref 26.0–34.0)
MCHC: 33.6 g/dL (ref 30.0–36.0)
MCV: 93 fL (ref 80.0–100.0)
Monocytes Absolute: 0.5 10*3/uL (ref 0.1–1.0)
Monocytes Relative: 8 %
Neutro Abs: 4.9 10*3/uL (ref 1.7–7.7)
Neutrophils Relative %: 69 %
Platelets: 239 10*3/uL (ref 150–400)
RBC: 4.42 MIL/uL (ref 4.22–5.81)
RDW: 12.3 % (ref 11.5–15.5)
WBC: 7.1 10*3/uL (ref 4.0–10.5)
nRBC: 0 % (ref 0.0–0.2)

## 2019-07-01 LAB — URINALYSIS, COMPLETE (UACMP) WITH MICROSCOPIC
Bacteria, UA: NONE SEEN
Bilirubin Urine: NEGATIVE
Glucose, UA: NEGATIVE mg/dL
Hgb urine dipstick: NEGATIVE
Ketones, ur: 5 mg/dL — AB
Leukocytes,Ua: NEGATIVE
Nitrite: NEGATIVE
Protein, ur: NEGATIVE mg/dL
Specific Gravity, Urine: 1.011 (ref 1.005–1.030)
pH: 6 (ref 5.0–8.0)

## 2019-07-01 LAB — PROTIME-INR
INR: 1.1 (ref 0.8–1.2)
Prothrombin Time: 14.3 seconds (ref 11.4–15.2)

## 2019-07-01 LAB — PLATELET INHIBITION P2Y12: Platelet Function  P2Y12: 221 [PRU] (ref 182–335)

## 2019-07-01 SURGERY — IR WITH ANESTHESIA
Anesthesia: General

## 2019-07-01 MED ORDER — CEFAZOLIN SODIUM-DEXTROSE 2-4 GM/100ML-% IV SOLN
2.0000 g | INTRAVENOUS | Status: DC
  Filled 2019-07-01: qty 100

## 2019-07-01 MED ORDER — CLOPIDOGREL BISULFATE 75 MG PO TABS: 75.0000 mg | ORAL_TABLET | ORAL | Status: DC

## 2019-07-01 MED ORDER — SODIUM CHLORIDE 0.9 % IV SOLN: INTRAVENOUS | Status: DC

## 2019-07-01 MED ORDER — NIMODIPINE 30 MG PO CAPS: 0.0000 mg | ORAL_CAPSULE | ORAL | Status: DC

## 2019-07-01 MED ORDER — ASPIRIN EC 325 MG PO TBEC: 325.0000 mg | DELAYED_RELEASE_TABLET | ORAL | Status: DC

## 2019-07-01 NOTE — Progress Notes (Signed)
NIR.  Patient was scheduled for an image-guided cerebral arteriogram with possible revascularization/angioplasty/stent placement of left VA stenosis this AM with Dr. Estanislado Pandy.  [id number] PRU this AM- discussed case with Dr. Estanislado Pandy who recommends switching DAPT and rescheduling procedure due to increased risk of stroke if procedure were to occur. Instructed patient to discontinue Plavi/Aspirin 325 use. Instructed patient to begin taking Brilinta 90 mg twice daily and Aspirin 81 mg once daily. Samples and coupon for Brilinta given to patient. Instructed patient that our schedulers will call him to reschedule this procedure. All questions answered and concerns addressed.  [place] [phone]) at [phone] to fill prescription- Brilinta 90 mg tablets, take one tablet by mouth twice daily, dispense 60 tablets with 3 refills.   Bea Graff Louk, PA-C 07/01/2019, 9:45 AM

## 2019-07-01 NOTE — Progress Notes (Signed)
Surgery cancelled, per Dr. Estanislado Pandy.  Patient being discharged home.  IV removed, catheter in place.

## 2019-07-02 ENCOUNTER — Encounter (HOSPITAL_COMMUNITY): Payer: Self-pay | Admitting: Interventional Radiology

## 2019-07-03 ENCOUNTER — Telehealth (HOSPITAL_COMMUNITY): Payer: Self-pay | Admitting: Radiology

## 2019-07-03 NOTE — Telephone Encounter (Signed)
Called pt, left VM for him to call to reschedule stenosis tx with Deveshwar JM

## 2019-07-07 ENCOUNTER — Ambulatory Visit (INDEPENDENT_AMBULATORY_CARE_PROVIDER_SITE_OTHER): Payer: Medicare Other | Admitting: *Deleted

## 2019-07-07 DIAGNOSIS — I255 Ischemic cardiomyopathy: Secondary | ICD-10-CM

## 2019-07-07 DIAGNOSIS — I5022 Chronic systolic (congestive) heart failure: Secondary | ICD-10-CM

## 2019-07-07 LAB — CUP PACEART REMOTE DEVICE CHECK
Battery Remaining Longevity: 40 mo
Battery Remaining Percentage: 50 %
Battery Voltage: 2.92 V
Brady Statistic AP VP Percent: 26 %
Brady Statistic AP VS Percent: 1 %
Brady Statistic AS VP Percent: 72 %
Brady Statistic AS VS Percent: 1 %
Brady Statistic RA Percent Paced: 26 %
Date Time Interrogation Session: 20200810060026
HighPow Impedance: 73 Ohm
HighPow Impedance: 73 Ohm
Implantable Lead Implant Date: 20160923
Implantable Lead Implant Date: 20160923
Implantable Lead Implant Date: 20160923
Implantable Lead Location: 753858
Implantable Lead Location: 753859
Implantable Lead Location: 753860
Implantable Lead Model: 7122
Implantable Pulse Generator Implant Date: 20160923
Lead Channel Impedance Value: 430 Ohm
Lead Channel Impedance Value: 460 Ohm
Lead Channel Impedance Value: 730 Ohm
Lead Channel Pacing Threshold Amplitude: 0.75 V
Lead Channel Pacing Threshold Amplitude: 0.875 V
Lead Channel Pacing Threshold Amplitude: 1.5 V
Lead Channel Pacing Threshold Pulse Width: 0.5 ms
Lead Channel Pacing Threshold Pulse Width: 0.5 ms
Lead Channel Pacing Threshold Pulse Width: 0.8 ms
Lead Channel Sensing Intrinsic Amplitude: 12 mV
Lead Channel Sensing Intrinsic Amplitude: 3.2 mV
Lead Channel Setting Pacing Amplitude: 2 V
Lead Channel Setting Pacing Amplitude: 2 V
Lead Channel Setting Pacing Amplitude: 2.25 V
Lead Channel Setting Pacing Pulse Width: 0.5 ms
Lead Channel Setting Pacing Pulse Width: 0.8 ms
Lead Channel Setting Sensing Sensitivity: 0.5 mV
Pulse Gen Serial Number: 7294918

## 2019-07-13 ENCOUNTER — Other Ambulatory Visit (HOSPITAL_COMMUNITY): Admission: RE | Admit: 2019-07-13 | Payer: Medicare Other | Source: Ambulatory Visit

## 2019-07-13 ENCOUNTER — Other Ambulatory Visit: Payer: Self-pay

## 2019-07-13 DIAGNOSIS — Z20822 Contact with and (suspected) exposure to covid-19: Secondary | ICD-10-CM

## 2019-07-15 LAB — NOVEL CORONAVIRUS, NAA: SARS-CoV-2, NAA: NOT DETECTED

## 2019-07-15 NOTE — Progress Notes (Signed)
Remote ICD transmission.   

## 2019-07-16 ENCOUNTER — Ambulatory Visit (HOSPITAL_COMMUNITY): Admission: RE | Admit: 2019-07-16 | Payer: Medicare Other | Source: Ambulatory Visit

## 2019-07-18 ENCOUNTER — Other Ambulatory Visit (HOSPITAL_COMMUNITY)
Admission: RE | Admit: 2019-07-18 | Discharge: 2019-07-18 | Disposition: A | Discharge status: Home / Self Care | Payer: Medicare Other | Source: Ambulatory Visit | Attending: Interventional Radiology | Admitting: Interventional Radiology

## 2019-07-18 DIAGNOSIS — Z20828 Contact with and (suspected) exposure to other viral communicable diseases: Secondary | ICD-10-CM | POA: Diagnosis not present

## 2019-07-18 DIAGNOSIS — Z01812 Encounter for preprocedural laboratory examination: Secondary | ICD-10-CM | POA: Insufficient documentation

## 2019-07-18 LAB — SARS CORONAVIRUS 2 (TAT 6-24 HRS): SARS Coronavirus 2: NEGATIVE

## 2019-07-21 ENCOUNTER — Other Ambulatory Visit: Payer: Self-pay | Admitting: Physician Assistant

## 2019-07-21 ENCOUNTER — Other Ambulatory Visit: Payer: Self-pay

## 2019-07-21 ENCOUNTER — Other Ambulatory Visit: Payer: Self-pay | Admitting: Student

## 2019-07-21 NOTE — Progress Notes (Signed)
Pt denies any acute cardiopulmonary issues. Pt stated that he is under the care of Dr. [place] Crane, PCP, Dr. Bronson Ing, Cardiology and Dr. Cristopher Peru, Cardiology (EP). Pt denies having a cardiac cath. Pt denies having an EKG and chest x ray within the last year. Pt stated that he takes Aspirin in the evening. Pt made aware to stop taking vitamins, fish oil (Lovaza) and herbal medications. Do not take any NSAIDs ie: Ibuprofen, Advil, Naproxen (Aleve), Motrin, BC and Goody Powder. Peri-op prescription for ICD initiated; awaiting response. Nurse spoke with Windle Guard, St Jude Representative to make aware of pt surgery tomorrow at 8:30 A.M. with pt scheduled to arrive at 6:30. Aaron Edelman stated " I will put him down. " Pt verbalized understanding of all pre-op instructions. See previous note from PA, Anesthesiology, in [place] 06/30/2019.

## 2019-07-22 ENCOUNTER — Ambulatory Visit (HOSPITAL_COMMUNITY): Payer: Medicare Other | Admitting: Certified Registered Nurse Anesthetist

## 2019-07-22 ENCOUNTER — Encounter (HOSPITAL_COMMUNITY): Payer: Self-pay | Admitting: Certified Registered Nurse Anesthetist

## 2019-07-22 ENCOUNTER — Encounter (HOSPITAL_COMMUNITY)
Admission: RE | Disposition: A | Discharge status: Home / Self Care | Payer: Self-pay | Source: Home / Self Care | Attending: Interventional Radiology

## 2019-07-22 ENCOUNTER — Ambulatory Visit (HOSPITAL_COMMUNITY)
Admission: RE | Admit: 2019-07-22 | Discharge: 2019-07-22 | Disposition: A | Discharge status: Home / Self Care | Payer: Medicare Other | Attending: Interventional Radiology | Admitting: Interventional Radiology

## 2019-07-22 ENCOUNTER — Ambulatory Visit (HOSPITAL_COMMUNITY)
Admission: RE | Admit: 2019-07-22 | Discharge: 2019-07-22 | Disposition: A | Discharge status: Home / Self Care | Payer: Medicare Other | Source: Ambulatory Visit | Attending: Interventional Radiology | Admitting: Interventional Radiology

## 2019-07-22 DIAGNOSIS — F17210 Nicotine dependence, cigarettes, uncomplicated: Secondary | ICD-10-CM | POA: Diagnosis not present

## 2019-07-22 DIAGNOSIS — F419 Anxiety disorder, unspecified: Secondary | ICD-10-CM | POA: Diagnosis not present

## 2019-07-22 DIAGNOSIS — Z95810 Presence of automatic (implantable) cardiac defibrillator: Secondary | ICD-10-CM | POA: Insufficient documentation

## 2019-07-22 DIAGNOSIS — I110 Hypertensive heart disease with heart failure: Secondary | ICD-10-CM | POA: Insufficient documentation

## 2019-07-22 DIAGNOSIS — I6502 Occlusion and stenosis of left vertebral artery: Secondary | ICD-10-CM | POA: Diagnosis not present

## 2019-07-22 DIAGNOSIS — F329 Major depressive disorder, single episode, unspecified: Secondary | ICD-10-CM | POA: Diagnosis not present

## 2019-07-22 DIAGNOSIS — Z7982 Long term (current) use of aspirin: Secondary | ICD-10-CM | POA: Insufficient documentation

## 2019-07-22 DIAGNOSIS — I5033 Acute on chronic diastolic (congestive) heart failure: Secondary | ICD-10-CM | POA: Diagnosis not present

## 2019-07-22 DIAGNOSIS — Z8673 Personal history of transient ischemic attack (TIA), and cerebral infarction without residual deficits: Secondary | ICD-10-CM | POA: Insufficient documentation

## 2019-07-22 DIAGNOSIS — G450 Vertebro-basilar artery syndrome: Secondary | ICD-10-CM | POA: Diagnosis not present

## 2019-07-22 DIAGNOSIS — I739 Peripheral vascular disease, unspecified: Secondary | ICD-10-CM | POA: Insufficient documentation

## 2019-07-22 DIAGNOSIS — E785 Hyperlipidemia, unspecified: Secondary | ICD-10-CM | POA: Diagnosis not present

## 2019-07-22 DIAGNOSIS — I509 Heart failure, unspecified: Secondary | ICD-10-CM | POA: Diagnosis not present

## 2019-07-22 DIAGNOSIS — F418 Other specified anxiety disorders: Secondary | ICD-10-CM | POA: Diagnosis not present

## 2019-07-22 DIAGNOSIS — Z79899 Other long term (current) drug therapy: Secondary | ICD-10-CM | POA: Diagnosis not present

## 2019-07-22 DIAGNOSIS — J449 Chronic obstructive pulmonary disease, unspecified: Secondary | ICD-10-CM | POA: Insufficient documentation

## 2019-07-22 DIAGNOSIS — I252 Old myocardial infarction: Secondary | ICD-10-CM | POA: Diagnosis not present

## 2019-07-22 HISTORY — PX: RADIOLOGY WITH ANESTHESIA: SHX6223

## 2019-07-22 HISTORY — PX: IR TRANSCATH EXCRAN VERT OR CAR A STENT: IMG1955

## 2019-07-22 LAB — APTT: aPTT: 31 seconds (ref 24–36)

## 2019-07-22 LAB — CBC WITH DIFFERENTIAL/PLATELET
Abs Immature Granulocytes: 0.01 10*3/uL (ref 0.00–0.07)
Basophils Absolute: 0.1 10*3/uL (ref 0.0–0.1)
Basophils Relative: 1 %
Eosinophils Absolute: 0.4 10*3/uL (ref 0.0–0.5)
Eosinophils Relative: 6 %
HCT: 44.4 % (ref 39.0–52.0)
Hemoglobin: 14.4 g/dL (ref 13.0–17.0)
Immature Granulocytes: 0 %
Lymphocytes Relative: 18 %
Lymphs Abs: 1.3 10*3/uL (ref 0.7–4.0)
MCH: 31 pg (ref 26.0–34.0)
MCHC: 32.4 g/dL (ref 30.0–36.0)
MCV: 95.5 fL (ref 80.0–100.0)
Monocytes Absolute: 0.6 10*3/uL (ref 0.1–1.0)
Monocytes Relative: 8 %
Neutro Abs: 5 10*3/uL (ref 1.7–7.7)
Neutrophils Relative %: 67 %
Platelets: 240 10*3/uL (ref 150–400)
RBC: 4.65 MIL/uL (ref 4.22–5.81)
RDW: 12.6 % (ref 11.5–15.5)
WBC: 7.3 10*3/uL (ref 4.0–10.5)
nRBC: 0 % (ref 0.0–0.2)

## 2019-07-22 LAB — BASIC METABOLIC PANEL
Anion gap: 13 (ref 5–15)
BUN: 13 mg/dL (ref 8–23)
CO2: 25 mmol/L (ref 22–32)
Calcium: 9.3 mg/dL (ref 8.9–10.3)
Chloride: 97 mmol/L — ABNORMAL LOW (ref 98–111)
Creatinine, Ser: 0.85 mg/dL (ref 0.61–1.24)
GFR calc Af Amer: >60 mL/min (ref >60)
GFR calc non Af Amer: >60 mL/min (ref >60)
Glucose, Bld: 103 mg/dL — ABNORMAL HIGH (ref 70–99)
Potassium: 3.9 mmol/L (ref 3.5–5.1)
Sodium: 135 mmol/L (ref 135–145)

## 2019-07-22 LAB — URINALYSIS, COMPLETE (UACMP) WITH MICROSCOPIC
Bacteria, UA: NONE SEEN
Bilirubin Urine: NEGATIVE
Glucose, UA: NEGATIVE mg/dL
Hgb urine dipstick: NEGATIVE
Ketones, ur: NEGATIVE mg/dL
Leukocytes,Ua: NEGATIVE
Nitrite: NEGATIVE
Protein, ur: NEGATIVE mg/dL
Specific Gravity, Urine: 1.006 (ref 1.005–1.030)
pH: 7 (ref 5.0–8.0)

## 2019-07-22 LAB — PROTIME-INR
INR: 1.1 (ref 0.8–1.2)
Prothrombin Time: 14.2 seconds (ref 11.4–15.2)

## 2019-07-22 LAB — PLATELET INHIBITION P2Y12: Platelet Function  P2Y12: 39 [PRU] — ABNORMAL LOW (ref 182–335)

## 2019-07-22 LAB — POCT ACTIVATED CLOTTING TIME: Activated Clotting Time: 252 seconds

## 2019-07-22 SURGERY — IR WITH ANESTHESIA
Anesthesia: Monitor Anesthesia Care

## 2019-07-22 MED ORDER — PROTAMINE SULFATE 10 MG/ML IV SOLN
INTRAVENOUS | Status: DC | PRN
  Administered 2019-07-22: 5 mg via INTRAVENOUS

## 2019-07-22 MED ORDER — FLUMAZENIL 0.5 MG/5ML IV SOLN
INTRAVENOUS | Status: AC
  Filled 2019-07-22: qty 5

## 2019-07-22 MED ORDER — FENTANYL CITRATE (PF) 100 MCG/2ML IJ SOLN: 25.0000 ug | INTRAMUSCULAR | Status: DC | PRN

## 2019-07-22 MED ORDER — IOHEXOL 300 MG/ML  SOLN
150.0000 mL | Freq: Once | INTRAMUSCULAR | Status: AC | PRN
  Administered 2019-07-22: 11:00:00 50 mL via INTRA_ARTERIAL

## 2019-07-22 MED ORDER — ACETAMINOPHEN 10 MG/ML IV SOLN: 1000.0000 mg | Freq: Once | INTRAVENOUS | Status: DC | PRN

## 2019-07-22 MED ORDER — NIMODIPINE 30 MG PO CAPS: 0.0000 mg | ORAL_CAPSULE | ORAL | Status: DC

## 2019-07-22 MED ORDER — LIDOCAINE HCL 1 % IJ SOLN
INTRAMUSCULAR | Status: AC
  Filled 2019-07-22: qty 20

## 2019-07-22 MED ORDER — TICAGRELOR 90 MG PO TABS: 90.0000 mg | ORAL_TABLET | Freq: Once | ORAL | Status: DC

## 2019-07-22 MED ORDER — FENTANYL CITRATE (PF) 100 MCG/2ML IJ SOLN
INTRAMUSCULAR | Status: AC
  Filled 2019-07-22: qty 2

## 2019-07-22 MED ORDER — PROPOFOL 500 MG/50ML IV EMUL
INTRAVENOUS | Status: DC | PRN
  Administered 2019-07-22: 15 ug/kg/min via INTRAVENOUS

## 2019-07-22 MED ORDER — CEFAZOLIN SODIUM-DEXTROSE 2-4 GM/100ML-% IV SOLN
2.0000 g | INTRAVENOUS | Status: AC
  Administered 2019-07-22: 2 g via INTRAVENOUS

## 2019-07-22 MED ORDER — MIDAZOLAM HCL 2 MG/2ML IJ SOLN
INTRAMUSCULAR | Status: DC | PRN
  Administered 2019-07-22: 2 mg via INTRAVENOUS

## 2019-07-22 MED ORDER — ONDANSETRON HCL 4 MG/2ML IJ SOLN: 4.0000 mg | Freq: Once | INTRAMUSCULAR | Status: DC | PRN

## 2019-07-22 MED ORDER — MEPERIDINE HCL 25 MG/ML IJ SOLN: 6.2500 mg | INTRAMUSCULAR | Status: DC | PRN

## 2019-07-22 MED ORDER — HEPARIN SODIUM (PORCINE) 1000 UNIT/ML IJ SOLN
INTRAMUSCULAR | Status: DC | PRN
  Administered 2019-07-22: 3000 [IU] via INTRAVENOUS

## 2019-07-22 MED ORDER — SODIUM CHLORIDE 0.9 % IV SOLN: INTRAVENOUS | Status: DC

## 2019-07-22 MED ORDER — MIDAZOLAM HCL 2 MG/2ML IJ SOLN
INTRAMUSCULAR | Status: AC
  Filled 2019-07-22: qty 2

## 2019-07-22 MED ORDER — FENTANYL CITRATE (PF) 100 MCG/2ML IJ SOLN
25.0000 ug | INTRAMUSCULAR | Status: DC | PRN
  Administered 2019-07-22 (×2): 25 ug via INTRAVENOUS

## 2019-07-22 MED ORDER — CEFAZOLIN SODIUM-DEXTROSE 2-4 GM/100ML-% IV SOLN
INTRAVENOUS | Status: AC
  Filled 2019-07-22: qty 100

## 2019-07-22 MED ORDER — ASPIRIN EC 325 MG PO TBEC: 325.0000 mg | DELAYED_RELEASE_TABLET | Freq: Once | ORAL | Status: DC

## 2019-07-22 MED ORDER — METOCLOPRAMIDE HCL 5 MG/ML IJ SOLN: 10.0000 mg | Freq: Once | INTRAMUSCULAR | Status: DC | PRN

## 2019-07-22 MED ORDER — FLUMAZENIL 0.5 MG/5ML IV SOLN
INTRAVENOUS | Status: DC | PRN
  Administered 2019-07-22: .2 mg via INTRAVENOUS

## 2019-07-22 MED ORDER — LACTATED RINGERS IV SOLN
INTRAVENOUS | Status: DC | PRN
  Administered 2019-07-22: 09:00:00 via INTRAVENOUS

## 2019-07-22 NOTE — Anesthesia Procedure Notes (Signed)
Procedure Name: MAC Date/Time: 07/22/2019 9:13 AM Performed by: Oletta Lamas, CRNA Pre-anesthesia Checklist: Patient identified, Emergency Drugs available, Suction available and Patient being monitored Patient Re-evaluated:Patient Re-evaluated prior to induction Oxygen Delivery Method: Nasal cannula

## 2019-07-22 NOTE — H&P (Deleted)
  The note originally documented on this encounter has been moved the the encounter in which it belongs.  

## 2019-07-22 NOTE — Progress Notes (Addendum)
Per Dr. Estanislado Pandy - Do not give any medications in pre-op

## 2019-07-22 NOTE — Procedures (Signed)
S/P Lt vertebral arteriogram followed by attempted  Endovascular revascularization of occluded Lt VA origin. S.Harlan Vinal MD

## 2019-07-22 NOTE — Progress Notes (Signed)
fentany 50 mcg wasted withnessed by Pat,RN in PACU

## 2019-07-22 NOTE — Anesthesia Postprocedure Evaluation (Signed)
Anesthesia Post Note  Patient: Roy [name]  Procedure(s) Performed: STENTING ([name] )     Patient location during evaluation: PACU Anesthesia Type: MAC Level of consciousness: awake and alert Pain management: pain level controlled Vital Signs Assessment: post-procedure vital signs reviewed and stable Respiratory status: spontaneous breathing, nonlabored ventilation, respiratory function stable and patient connected to nasal cannula oxygen Cardiovascular status: stable and blood pressure returned to baseline Postop Assessment: no apparent nausea or vomiting Anesthetic complications: no    Last Vitals:  Vitals:   07/22/19 1127 07/22/19 1142  BP: (!) 141/59 134/65  Pulse: (!) 59 60  Resp: 18 11  Temp:    SpO2: 97% 97%    Last Pain:  Vitals:   07/22/19 1145  TempSrc:   PainSc: 0-No pain                 Montez Hageman

## 2019-07-22 NOTE — Anesthesia Procedure Notes (Signed)
Arterial Line Insertion Start/End8/26/2020 8:35 AM, 07/22/2019 8:50 AM Performed by: CRNA  Preanesthetic checklist: patient identified, IV checked, site marked, risks and benefits discussed, surgical consent, monitors and equipment checked, pre-op evaluation, timeout performed and anesthesia consent Left, radial was placed Catheter size: 20 G Hand hygiene performed , maximum sterile barriers used  and Seldinger technique used Allen's test indicative of satisfactory collateral circulation Attempts: 1 Procedure performed without using ultrasound guided technique. Following insertion, Biopatch and dressing applied. Post procedure assessment: normal  Patient tolerated the procedure well with no immediate complications.

## 2019-07-22 NOTE — Sedation Documentation (Signed)
Patient under care of CRNA with this RN in room to assist as needed 

## 2019-07-22 NOTE — Sedation Documentation (Signed)
Patient transported to PACU 24. Pulses and Groin check reviewed with PACU RN.

## 2019-07-22 NOTE — Progress Notes (Signed)
Patient ID: Roy [name], male   DOB: 10-31-1946, 73 y.o.   MRN: [id number] INR. Post procedure . Denies any H/As,N/V ,visual or speech difficulties. Alert,awake Ox 3 Moving all 4s equally. RT groin soft . No hematoma. Distal pulses palpable DPs and PTs . Patient can be D/Ced to home from recovery  After 4 hrs. S.deve3shwar MD

## 2019-07-22 NOTE — H&P (Signed)
Chief Complaint: Patient was seen in consultation today for right ICA stenosis s/p stent placement/diagnostic cerebral arteriogram.  Referring Physician(s): None  Supervising Physician: Luanne Bras  Patient Status: [facility] - Out-pt  History of Present Illness: Roy [name] is a 73 y.o. male with a past medical history of hypertension, PVD, MI, LBBB, HF, s/p pacemaker, hyperlipidemia, thrombocytopenia, CVA [id number], COPD, alcoholic cirrhosis, DJD, anxiety, depression, tobacco abuse, and alcohol abuse. He is known to [facility] and has been followed by Dr. Estanislado Pandy since 01/2008. He first presented to our department for management of dizziness secondary to right ICA stenosis. He underwent an image-guided cerebral arteriogram with revascularization of his right ICA stenosis using stent assisted angioplasty 09/02/2008 by Dr. Estanislado Pandy. Following stent placement, he was found to have intrastent stenosis and underwent an image-guided cerebral arteriogram with revascularization of this intrastent stenosis using angioplasty 10/12/2008 by Dr. Estanislado Pandy. He has since been followed with routine diagnostic cerebral arteriograms and imaging scans. His most recent CTA head/neck 08/12/2018 noted stable appearance of right ICA stent, however artifact limited in stent evaluation. Because of this, it was recommended that patient undergo an image-guided diagnostic cerebral arteriogram at next follow-up to evaluate patient's right ICA stent.  Recent arteriogram on 7/17 shows severe left vertebral stenosis. Pt now scheduled for attempt at angioplasty/stent.  Has been switched to Brilinta 90 mg BID and has been taking as instructed in addition to ASA 325 mg and his other daily meds.  PMHx, meds, labs, imaging, allergies reviewed. Feels well, no recent fevers, chills, illness. Has been NPO today as directed.      Past Medical History:  Diagnosis Date  . AICD (automatic cardioverter/defibrillator) present  08/19/2015   St Jude BiV ICD for primary prevention by Dr. Lovena Le  . Alcohol abuse    6 beers per day; hospital admission in 2009 for withdrawal symptoms  . Alcoholic cirrhosis ([place])   . Anxiety and depression    denies   . Cerebrovascular disease 2009   TIA; 2009- right ICA stent; re-intervention for restenosis complicated by [facility] w/o sx  . CHF (congestive heart failure) ([place]) 06-02-14  . Chronic obstructive pulmonary disease ([place])   . Degenerative joint disease    Total shoulder arthroplasty-right  . Hyperlipidemia   . Hypertension   . LBBB (left bundle branch block)    Normal echo-2011; stress nuclear in 09/2010--septal hypoperfusion representing nontransmural infarction or the effect of left bundle branch block, no ischemia  . Myocardial infarction [facility]) June 02, 2014   Massive Heart Attack  . Peripheral vascular disease ([place])   . 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  . Thrombocytopenia ([place])   . Tobacco abuse    -100 pack years; 1.5 packs per day  . Traumatic seroma of thigh ([place])    left  . Tubular adenoma of colon     Past Surgical History:  Procedure Laterality Date  . ABDOMINAL AORTAGRAM [name] 05/04/2014   Procedure: ABDOMINAL Maxcine Ham;  Surgeon: [name], MD;  Location: [facility] CATH LAB;  Service: Cardiovascular;  Laterality: [name];  . ABDOMINAL AORTOGRAM [name] 06/25/2017   Procedure: Abdominal Aortogram;  Surgeon: [name], MD;  Location: [place] CV LAB;  Service: Cardiovascular;  Laterality: [name];  . ABDOMINAL AORTOGRAM W/LOWER EXTREMITY [name] 04/22/2018   Procedure: ABDOMINAL AORTOGRAM W/LOWER EXTREMITY;  Surgeon: [name], MD;  Location: [facility] CV LAB;  Service: Cardiovascular;  Laterality: [name];  . AGILE CAPSULE [name] 03/18/2014   Procedure: AGILE CAPSULE;  Surgeon: Marga Melnick  Fields, MD;  Location: [facility] ENDO SU[facility];  Service: Endoscopy;  Laterality: [name];  7:30  . APPLICATION OF WOUND VAC Left 09/03/2017   Procedure: APPLICATION OF WOUND VAC;   Surgeon: Conrad [place] Hills, MD;  Location: [place];  Service: Vascular;  Laterality: Left;  . BACTERIAL OVERGROWTH TEST [name] 05/24/2015   Procedure: BACTERIAL OVERGROWTH TEST;  Surgeon: Danie Binder, MD;  [facility];  Service: Endoscopy;  Laterality: [name];  0700  . BI-VENTRICULAR IMPLANTABLE CARDIOVERTER DEFIBRILLATOR  (CRT-D)  08/19/2015  . CARPAL TUNNEL RELEASE Left 02/02/2016   Procedure: LEFT CARPAL TUNNEL RELEASE;  Surgeon: Daryll Brod, MD;  Location: [place];  Service: Orthopedics;  Laterality: Left;  ANESTHESIA: IV REGIONAL UPPER ARM  . CATARACT EXTRACTION W/PHACO Right 03/08/2015   Procedure: CATARACT EXTRACTION PHACO AND INTRAOCULAR LENS PLACEMENT (IOC);  Surgeon: Rutherford Guys, MD;  Location: [facility] ORS;  Service: Ophthalmology;  Laterality: Right;  CDE:9.46  . CATARACT EXTRACTION W/PHACO Left 03/22/2015   Procedure: CATARACT EXTRACTION PHACO AND INTRAOCULAR LENS PLACEMENT (IOC);  Surgeon: Rutherford Guys, MD;  Location: [facility] ORS;  Service: Ophthalmology;  Laterality: Left;  CDE:5.80  . COLONOSCOPY  08/22/09   Fields-(Tubular Adenoma)3-mm transverse polyp/4-mm polyp otherwise noraml/small internal hemorrhoids  . COLONOSCOPY [name] 04/14/2014   [id number] internal hemorrhids/normal mocsa in the terminal iluem/left colonis redundant  . COLONOSCOPY W/ POLYPECTOMY  2011  . ENDARTERECTOMY FEMORAL Left 08/16/2017   Procedure: ENDARTERECTOMY LEFT PROFUNDA FEMORAL;  Surgeon: [name], MD;  Location: [place];  Service: Vascular;  Laterality: Left;  . EP IMPLANTABLE DEVICE [name] 08/19/2015   Procedure: BiV ICD Insertion CRT-D;  Surgeon: Evans Lance, MD;  Location: [place] CV LAB;  Service: Cardiovascular;  Laterality: [name];  . ESOPHAGOGASTRODUODENOSCOPY [name] 03/05/2014   SLF: 1. Stricture at the gastroesophagael junction 2. Small hiatal hernia 3. Moderate non-erosive gastritis and duodentitis. 4. No surce for Melena identified.   . FEMORAL-POPLITEAL BYPASS GRAFT Left 08/16/2017    Procedure: LEFT  FEMORAL-POPLITEAL ARTERY  BYPASS GRAFT;  Surgeon: [name], MD;  Location: [place];  Service: Vascular;  Laterality: Left;  . GIVENS CAPSULE STUDY [name] 03/30/2014   Procedure: GIVENS CAPSULE STUDY;  Surgeon: Danie Binder, MD;  [facility];  Service: Endoscopy;  Laterality: [name];  7:30  . I&D EXTREMITY Left 09/03/2017   Procedure: IRRIGATION AND DEBRIDEMENT EXTREMITY LEFT THIGH SEROMA;  Surgeon: Conrad , MD;  Location: [facility] OR;  Service: Vascular;  Laterality: Left;  . IR ANGIO INTRA EXTRACRAN SEL COM CAROTID INNOMINATE BILAT MOD SED  07/16/2017  . IR ANGIO INTRA EXTRACRAN SEL COM CAROTID INNOMINATE BILAT MOD SED  06/12/2019  . IR ANGIO VERTEBRAL SEL SUBCLAVIAN INNOMINATE BILAT MOD SED  07/16/2017  . IR ANGIO VERTEBRAL SEL SUBCLAVIAN INNOMINATE BILAT MOD SED  06/12/2019  . IR RADIOLOGIST EVAL & MGMT  04/01/2017  . IR RADIOLOGIST EVAL & MGMT  08/02/2017  . IR US GUIDE VASC ACCESS RIGHT  06/12/2019  . LOWER EXTREMITY ANGIOGRAPHY Bilateral 06/25/2017   Procedure: Lower Extremity Angiography;  Surgeon: [name], MD;  Location: [place] CV LAB;  Service: Cardiovascular;  Laterality: Bilateral;  . LUMBAR FUSION  2010  . PERIPHERAL VASCULAR ATHERECTOMY Right 04/22/2018   Procedure: PERIPHERAL VASCULAR ATHERECTOMY;  Surgeon: [name], MD;  Location: [place] CV LAB;  Service: Cardiovascular;  Laterality: Right;  right superficial femoral  . PERIPHERAL VASCULAR BALLOON ANGIOPLASTY Right 04/22/2018   Procedure: PERIPHERAL VASCULAR BALLOON ANGIOPLASTY;  Surgeon: [name],  Butch Penny, MD;  Location: [place] CV LAB;  Service: Cardiovascular;  Laterality: Right;  external iliac  . RADIOLOGY WITH ANESTHESIA [name] 07/01/2019   Procedure: STENTING;  Surgeon: Luanne Bras, MD;  Location: [place];  Service: Radiology;  Laterality: [name];  . TOTAL SHOULDER ARTHROPLASTY Right 2011   Dr. Tamera Punt  . ULNAR NERVE TRANSPOSITION Left 02/02/2016   Procedure: LEFT IN-SITU  DECOMPRESSION ULNAR NERVE ;  Surgeon: Daryll Brod, MD;  Location: [place];  Service: Orthopedics;  Laterality: Left;  . ULNAR TUNNEL RELEASE Left 02/02/2016   Procedure: LEFT CUBITAL TUNNEL RELEASE;  Surgeon: Daryll Brod, MD;  Location: [place];  Service: Orthopedics;  Laterality: Left;  [name] VASECTOMY  1971  . VEIN HARVEST Left 08/16/2017   Procedure: USING NON REVERSE LEFT GREATER SAPHENOUS VEIN HARVEST;  Surgeon: [name], MD;  Location: [place];  Service: Vascular;  Laterality: Left;    Allergies: Patient has no known allergies.  Medications: Prior to Admission medications   Medication Sig Start Date End Date Taking? Authorizing Provider  acetaminophen (TYLENOL) 500 MG tablet Take 1,000 mg by mouth daily as needed for moderate pain.    Yes [provider]  aspirin EC 81 MG tablet Take 81 mg by mouth every evening.    Yes [provider]  atorvastatin (LIPITOR) 20 MG tablet Take 20 mg by mouth daily.    Yes [provider]  carvedilol (COREG) 3.125 MG tablet TAKE 1 TABLET TWICE A DAY 04/21/19  Yes Herminio Commons, MD  dextromethorphan-guaiFENesin (MUCINEX DM) 30-600 MG 12hr tablet Take 1 tablet by mouth every evening.    Yes [provider]  ENTRESTO 49-51 MG TAKE 1 TABLET TWICE A DAY 10/27/18  Yes Herminio Commons, MD  Ferrous Sulfate 27 MG TABS Take 27 mg by mouth every Monday, Wednesday, and Friday.   Yes [provider]  gabapentin (NEURONTIN) 300 MG capsule Take 300 mg by mouth 2 (two) times daily.  06/14/15  Yes [provider]  Multiple Vitamins-Minerals (CENTRUM SILVER PO) Take 1 tablet by mouth daily.    Yes [provider]  pantoprazole (PROTONIX) 40 MG tablet TAKE 1 TABLET DAILY 06/26/18  Yes Annitta Needs, NP  Potassium 99 MG TABS Take 99 mg by mouth every Monday, Wednesday, and Friday. Takes Mon, Wed, Fri   Yes [provider]  SUPER B COMPLEX/C PO Take 1 tablet by  mouth daily.   Yes [provider]  tamsulosin (FLOMAX) 0.4 MG CAPS capsule Take 0.4 mg by mouth 2 (two) times daily.    Yes [provider]  Tetrahydrozoline HCl (VISINE OP) Place 1 drop into both eyes daily as needed (dry eyes).   Yes [provider]   Brilinta 90mg  BID  Family History  Problem Relation Age of Onset  . Coronary artery disease Mother   . Diabetes Mother   . Heart disease Mother        Before age 22 - 24 Bypasses  . Hypertension Mother   . Heart attack Mother        3-4 Heart attacks  . Alzheimer's disease Father   . Diabetes Father   . Heart attack Sister   . Cancer Brother        Prostate  . Hyperlipidemia Son   . Prostate cancer Brother   . Alzheimer's disease Sister   . Colon cancer Neg Hx     Social History   Socioeconomic History  .  Marital status: Married    Spouse name: Not on file  . Number of children: 2  . Years of education: Not on file  . Highest education level: Not on file  Occupational History  . Occupation: retired, Clinical biochemist    Comment: Programmer, systems: RETIRED  Social Needs  . Financial resource strain: Not on file  . Food insecurity    Worry: Not on file    Inability: Not on file  . Transportation needs    Medical: Not on file    Non-medical: Not on file  Tobacco Use  . Smoking status: Current Every Day Smoker    Packs/day: 1.00    Years: 60.00    Pack years: 60.00    Types: Cigarettes    Start date: 08/20/1957  . Smokeless tobacco: Never Used  Substance and Sexual Activity  . Alcohol use: No    Alcohol/week: 0.0 standard drinks    Comment: quit in July 2015  . Drug use: No  . Sexual activity: Never  Lifestyle  . Physical activity    Days per week: Not on file    Minutes per session: Not on file  . Stress: Not on file  Relationships  . Social Herbalist on phone: Not on file    Gets together: Not on file    Attends religious service: Not on file    Active member of  club or organization: Not on file    Attends meetings of clubs or organizations: Not on file    Relationship status: Not on file  Other Topics Concern  . Not on file  Social History Narrative   Accompanied by daughter [name]   Lives w/ wife, daughter     Review of Systems: A 12 point ROS discussed and pertinent positives are indicated in the HPI above.  All other systems are negative.  Review of Systems  Constitutional: Negative for chills and fever.  Eyes: Negative for visual disturbance.  Respiratory: Negative for shortness of breath and wheezing.   Cardiovascular: Negative for chest pain and palpitations.  Gastrointestinal: Negative for abdominal pain.  Neurological: Negative for dizziness, weakness, numbness and headaches.  Psychiatric/Behavioral: Negative for behavioral problems and confusion.    Vital Signs: T: 97.9  BP: 155/51  HR: 69  RR: 16  Physical Exam Vitals signs and nursing note reviewed.  Constitutional:      General: He is not in acute distress.    Appearance: Normal appearance.  HENT:     Head: Normocephalic.     Mouth/Throat:     Mouth: Mucous membranes are moist.     Pharynx: Oropharynx is clear.  Neck:     Musculoskeletal: Normal range of motion. No neck rigidity.  Cardiovascular:     Rate and Rhythm: Normal rate and regular rhythm.     Pulses: Normal pulses.     Heart sounds: Normal heart sounds. No murmur.  Pulmonary:     Effort: Pulmonary effort is normal. No respiratory distress.     Breath sounds: Normal breath sounds. No wheezing.  Abdominal:     Palpations: Abdomen is soft.     Tenderness: There is no abdominal tenderness.  Skin:    General: Skin is warm and dry.  Neurological:     General: No focal deficit present.     Mental Status: He is alert and oriented to person, place, and time.  Psychiatric:        Mood and Affect: Mood  normal.        Behavior: Behavior normal.        Thought Content: Thought content normal.         Judgment: Judgment normal.          Imaging: No results found.  Labs:  CBC: Recent Labs    02/26/19 0855 06/12/19 0639 07/01/19 0715 07/22/19 0654  WBC 7.6 7.1 7.1 7.3  HGB 14.2 15.2 13.8 14.4  HCT 40.6 44.9 41.1 44.4  PLT 240 228 239 240    COAGS: Recent Labs    08/20/18 1033 02/26/19 0855 06/12/19 0639 07/01/19 0715  INR 1.0 1.0 1.0 1.1  APTT  --   --  32 32    BMP: Recent Labs    08/20/18 1033 02/26/19 0855 06/12/19 0639 07/01/19 0715  NA 137 139 135 135  K 4.0 5.1 3.9 3.8  CL 100 103 100 99  CO2 31 27 25 24   GLUCOSE 67 96 102* 108*  BUN 8 7 7* 10  CALCIUM 9.3 9.5 9.4 9.1  CREATININE 0.67* 0.69* 0.71 0.73  GFRNONAA  --   --  >60 >60  GFRAA  --   --  >60 >60    LIVER FUNCTION TESTS: Recent Labs    08/20/18 1033 02/26/19 0855  BILITOT 0.4 0.3  AST 16 17  ALT 11 12  PROT 6.8 6.8    TUMOR MARKERS: Recent Labs    08/20/18 1033 02/26/19 0855  AFPTM 1.6 1.4    Assessment and Plan: Severe left vertebral artery stenosis. Plan for arteriogram with intended angioplasty/atent. Has been taking Brilinta as directed. Labs pending. If procedure completed, he will be admitted for overnight observation.   Risks and benefits of cerebral arteriogram were discussed with the patient including, but not limited to bleeding, infection, vascular injury or contrast induced renal failure. This interventional procedure involves the use of X-rays and because of the nature of the planned procedure, it is possible that we will have prolonged use of X-ray fluoroscopy. Potential radiation risks to you include (but are not limited to) the following: - A slightly elevated risk for cancer  several years later in life. This risk is typically less than 0.5% percent. This risk is low in comparison to the normal incidence of human cancer, which is 33% for women and 50% for men according to the [place]. - Radiation induced injury can include skin  redness, resembling a rash, tissue breakdown / ulcers and hair loss (which can be temporary or permanent).  The likelihood of either of these occurring depends on the difficulty of the procedure and whether you are sensitive to radiation due to previous procedures, disease, or genetic conditions.  IF your procedure requires a prolonged use of radiation, you will be notified and given written instructions for further action.  It is your responsibility to monitor the irradiated area for the 2 weeks following the procedure and to notify your physician if you are concerned that you have suffered a radiation induced injury.   All of the patient's questions were answered, patient is agreeable to proceed. Consent signed and in chart.   Thank you for this interesting consult.  I greatly enjoyed meeting Roy [name] and look forward to participating in their care.  A copy of this report was sent to the requesting provider on this date.  Electronically Signed: Ascencion Dike, PA-C 07/22/2019, 7:35 AM   I spent a total of 30 minutes in face to face in clinical  consultation, greater than 50% of which was counseling/coordinating care for right ICA stenosis s/p stent placement/diagnostic cerebral arteriogram with stent angioplasty

## 2019-07-22 NOTE — Anesthesia Preprocedure Evaluation (Addendum)
Anesthesia Evaluation  Patient identified by MRN, date of birth, ID band Patient awake    Reviewed: Allergy & Precautions, NPO status , Patient's Chart, lab work & pertinent test results  Airway Mallampati: I  TM Distance: >3 FB Neck ROM: Full    Dental  (+) Edentulous Upper, Edentulous Lower   Pulmonary COPD, Current Smoker,     + decreased breath sounds      Cardiovascular hypertension, + Past MI, + Peripheral Vascular Disease and +CHF  + dysrhythmias + pacemaker + Cardiac Defibrillator  Rhythm:Regular Rate:Normal     Neuro/Psych PSYCHIATRIC DISORDERS Anxiety negative neurological ROS     GI/Hepatic negative GI ROS, (+) Cirrhosis       ,   Endo/Other  negative endocrine ROS  Renal/GU Renal disease     Musculoskeletal  (+) Arthritis ,   Abdominal   Peds  Hematology negative hematology ROS (+)   Anesthesia Other Findings   Reproductive/Obstetrics negative OB ROS                             Lab Results  Component Value Date   WBC 7.3 07/22/2019   HGB 14.4 07/22/2019   HCT 44.4 07/22/2019   MCV 95.5 07/22/2019   PLT 240 07/22/2019   Lab Results  Component Value Date   INR 1.1 07/01/2019   INR 1.0 06/12/2019   INR 1.0 02/26/2019    Echo: - Left ventricle: The cavity size was normal. Wall thickness was   normal. Systolic function was normal. The estimated ejection   fraction was in the range of 50% to 55%. Wall motion was normal;   there were no regional wall motion abnormalities. Doppler   parameters are consistent with abnormal left ventricular   relaxation (grade 1 diastolic dysfunction). Indeterminate filling   pressures. - Aortic valve: Trileaflet; mildly thickened leaflets. - Mitral valve: Calcified annulus. Normal thickness leaflets .   There was mild regurgitation. - Atrial septum: No defect or patent foramen ovale was identified.   Anesthesia  Physical  Anesthesia Plan  ASA: III  Anesthesia Plan: MAC   Post-op Pain Management:    Induction: Intravenous  PONV Risk Score and Plan: 2 and Ondansetron and Dexamethasone  Airway Management Planned: Nasal Cannula and Simple Face Mask  Additional Equipment:   Intra-op Plan:   Post-operative Plan:   Informed Consent: I have reviewed the patients History and Physical, chart, labs and discussed the procedure including the risks, benefits and alternatives for the proposed anesthesia with the patient or authorized representative who has indicated his/her understanding and acceptance.     Dental advisory given  Plan Discussed with: CRNA  Anesthesia Plan Comments:       Anesthesia Quick Evaluation

## 2019-07-22 NOTE — Transfer of Care (Signed)
Immediate Anesthesia Transfer of Care Note  Patient: Roy [name]  Procedure(s) Performed: STENTING ([name] )  Patient Location: PACU  Anesthesia Type:General  Level of Consciousness: awake, alert , oriented and patient cooperative  Airway & Oxygen Therapy: Patient Spontanous Breathing  Post-op Assessment: Report given to RN and Post -op Vital signs reviewed and stable  Post vital signs: Reviewed and stable  Last Vitals:  Vitals Value Taken Time  BP 150/59 07/22/19 1115  Temp 36.4 C 07/22/19 1115  Pulse 63 07/22/19 1125  Resp 9 07/22/19 1125  SpO2 97 % 07/22/19 1125  Vitals shown include unvalidated device data.  Last Pain:  Vitals:   07/22/19 1115  TempSrc:   PainSc: 0-No pain         Complications: No apparent anesthesia complications  Neurological assessment same as pre-op

## 2019-07-23 ENCOUNTER — Encounter (HOSPITAL_COMMUNITY): Payer: Self-pay | Admitting: Interventional Radiology

## 2019-07-27 ENCOUNTER — Encounter (HOSPITAL_COMMUNITY): Payer: Self-pay | Admitting: Interventional Radiology

## 2019-07-28 ENCOUNTER — Ambulatory Visit (INDEPENDENT_AMBULATORY_CARE_PROVIDER_SITE_OTHER): Payer: Medicare Other | Admitting: Cardiovascular Disease

## 2019-07-28 ENCOUNTER — Other Ambulatory Visit: Payer: Self-pay

## 2019-07-28 ENCOUNTER — Encounter: Payer: Self-pay | Admitting: Cardiovascular Disease

## 2019-07-28 VITALS — BP 148/65 | HR 65 | Temp 97.8°F | Ht 63.0 in | Wt 149.0 lb

## 2019-07-28 DIAGNOSIS — Z95810 Presence of automatic (implantable) cardiac defibrillator: Secondary | ICD-10-CM

## 2019-07-28 DIAGNOSIS — I779 Disorder of arteries and arterioles, unspecified: Secondary | ICD-10-CM

## 2019-07-28 DIAGNOSIS — Z720 Tobacco use: Secondary | ICD-10-CM | POA: Diagnosis not present

## 2019-07-28 DIAGNOSIS — I10 Essential (primary) hypertension: Secondary | ICD-10-CM

## 2019-07-28 DIAGNOSIS — E785 Hyperlipidemia, unspecified: Secondary | ICD-10-CM

## 2019-07-28 DIAGNOSIS — I255 Ischemic cardiomyopathy: Secondary | ICD-10-CM

## 2019-07-28 DIAGNOSIS — I5022 Chronic systolic (congestive) heart failure: Secondary | ICD-10-CM | POA: Diagnosis not present

## 2019-07-28 DIAGNOSIS — I25118 Atherosclerotic heart disease of native coronary artery with other forms of angina pectoris: Secondary | ICD-10-CM

## 2019-07-28 NOTE — Patient Instructions (Signed)
Medication Instructions: Your physician recommends that you continue on your current medications as directed. Please refer to the Current Medication list given to you today.   Labwork: None  Procedures/Testing: None  Follow-Up: 1 year with Dr.Koneswaran  Any Additional Special Instructions Will Be Listed Below (If Applicable).     If you need a refill on your cardiac medications before your next appointment, please call your pharmacy.

## 2019-07-28 NOTE — Progress Notes (Signed)
SUBJECTIVE: The patient presents for routine follow-up.  He has a history of coronary artery disease and CABG, peripheral arterial disease, and biventricular ICD.  Echocardiogram 08/01/2017 demonstrated normalization of left ventricular systolic function with normal regional wall motion, LVEF 50 to [id number], grade 1 diastolic dysfunction, and mild mitral regurgitation.  Nuclear stress testing on 08/30/2014 demonstrated a large area of inferior myocardial scar with no evidence of ischemia, and inferior akinesis.  He continues to smoke and has no desire to quit.   The patient denies any symptoms of chest pain, palpitations, shortness of breath, lightheadedness, dizziness, leg swelling, orthopnea, PND, and syncope.  After working on his [place] for the past 2 years, he is finally going to paint it this upcoming Thursday.   Review of Systems: As per "subjective", otherwise negative.  No Known Allergies  Current Outpatient Medications  Medication Sig Dispense Refill  . acetaminophen (TYLENOL) 500 MG tablet Take 1,000 mg by mouth every 6 (six) hours as needed for mild pain or headache.     [name] aspirin EC 81 MG tablet Take 81 mg by mouth every evening.     [name] atorvastatin (LIPITOR) 20 MG tablet Take 20 mg by mouth daily.     [name] BRILINTA 90 MG TABS tablet Take 90 mg by mouth 2 (two) times daily.    . carvedilol (COREG) 3.125 MG tablet TAKE 1 TABLET TWICE A DAY (Patient taking differently: Take 3.125 mg by mouth 2 (two) times daily with a meal. ) 180 tablet 0  . dextromethorphan-guaiFENesin (MUCINEX DM) 30-600 MG 12hr tablet Take 1 tablet by mouth every evening.     [name] ENTRESTO 49-51 MG TAKE 1 TABLET TWICE A DAY (Patient taking differently: Take 1 tablet by mouth 2 (two) times daily. ) 180 tablet 4  . Ferrous Sulfate 27 MG TABS Take 27 mg by mouth every Monday, Wednesday, and Friday.    . gabapentin (NEURONTIN) 300 MG capsule Take 300 mg by mouth 2 (two) times daily.     . Multiple  Vitamins-Minerals (CENTRUM SILVER PO) Take 1 tablet by mouth daily.     [name] omega-3 acid ethyl esters (LOVAZA) 1 g capsule Take 1 g by mouth 2 (two) times daily.    . pantoprazole (PROTONIX) 40 MG tablet TAKE 1 TABLET DAILY (Patient taking differently: Take 40 mg by mouth daily. ) 90 tablet 4  . Potassium 99 MG TABS Take 99 mg by mouth every Monday, Wednesday, and Friday.     . SUPER B COMPLEX/C PO Take 1 tablet by mouth at bedtime.     . tamsulosin (FLOMAX) 0.4 MG CAPS capsule Take 0.4 mg by mouth 2 (two) times daily.     . Tetrahydrozoline HCl (VISINE OP) Place 1 drop into both eyes daily as needed (dry eyes).     No current facility-administered medications for this visit.     Past Medical History:  Diagnosis Date  . AICD (automatic cardioverter/defibrillator) present 08/19/2015   St Jude BiV ICD for primary prevention by Dr. Lovena Le  . Alcohol abuse    6 beers per day; hospital admission in 2009 for withdrawal symptoms  . Alcoholic cirrhosis ([place])   . Anxiety and depression    denies   . Cerebrovascular disease 2009   TIA; 2009- right ICA stent; re-intervention for restenosis complicated by [facility] w/o sx  . CHF (congestive heart failure) ([place]) 06-02-14  . Chronic obstructive pulmonary disease ([place])   . Degenerative joint disease  Total shoulder arthroplasty-right  . Hyperlipidemia   . Hypertension   . LBBB (left bundle branch block)    Normal echo-2011; stress nuclear in 09/2010--septal hypoperfusion representing nontransmural infarction or the effect of left bundle branch block, no ischemia  . Myocardial infarction [facility]) June 02, 2014   Massive Heart Attack  . Peripheral vascular disease ([place])   . 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  . Thrombocytopenia ([place])   . Tobacco abuse    -100 pack years; 1.5 packs per day  . Traumatic seroma of thigh ([place])    left  . Tubular adenoma of colon     Past Surgical History:  Procedure Laterality Date  . ABDOMINAL AORTAGRAM [name]  05/04/2014   Procedure: ABDOMINAL Maxcine Ham;  Surgeon: [name], MD;  Location: [facility] CATH LAB;  Service: Cardiovascular;  Laterality: [name];  . ABDOMINAL AORTOGRAM [name] 06/25/2017   Procedure: Abdominal Aortogram;  Surgeon: [name], MD;  Location: [place] CV LAB;  Service: Cardiovascular;  Laterality: [name];  . ABDOMINAL AORTOGRAM W/LOWER EXTREMITY [name] 04/22/2018   Procedure: ABDOMINAL AORTOGRAM W/LOWER EXTREMITY;  Surgeon: [name], MD;  Location: [place] CV LAB;  Service: Cardiovascular;  Laterality: [name];  . AGILE CAPSULE [name] 03/18/2014   Procedure: AGILE CAPSULE;  Surgeon: Danie Binder, MD;  [facility] ENDO SU[facility];  Service: Endoscopy;  Laterality: [name];  7:30  . APPLICATION OF WOUND VAC Left 09/03/2017   Procedure: APPLICATION OF WOUND VAC;  Surgeon: Conrad Townsend, MD;  Location: [place];  Service: Vascular;  Laterality: Left;  . BACTERIAL OVERGROWTH TEST [name] 05/24/2015   Procedure: BACTERIAL OVERGROWTH TEST;  Surgeon: Danie Binder, MD;  [facility];  Service: Endoscopy;  Laterality: [name];  0700  . BI-VENTRICULAR IMPLANTABLE CARDIOVERTER DEFIBRILLATOR  (CRT-D)  08/19/2015  . CARPAL TUNNEL RELEASE Left 02/02/2016   Procedure: LEFT CARPAL TUNNEL RELEASE;  Surgeon: Daryll Brod, MD;  Location: [place];  Service: Orthopedics;  Laterality: Left;  ANESTHESIA: IV REGIONAL UPPER ARM  . CATARACT EXTRACTION W/PHACO Right 03/08/2015   Procedure: CATARACT EXTRACTION PHACO AND INTRAOCULAR LENS PLACEMENT (IOC);  Surgeon: Rutherford Guys, MD;  Location: [facility];  Service: Ophthalmology;  Laterality: Right;  CDE:9.46  . CATARACT EXTRACTION W/PHACO Left 03/22/2015   Procedure: CATARACT EXTRACTION PHACO AND INTRAOCULAR LENS PLACEMENT (IOC);  Surgeon: Rutherford Guys, MD;  Location: [facility];  Service: Ophthalmology;  Laterality: Left;  CDE:5.80  . COLONOSCOPY  08/22/09   Fields-(Tubular Adenoma)3-mm transverse polyp/4-mm polyp otherwise noraml/small internal hemorrhoids  .  COLONOSCOPY [name] 04/14/2014   [id number] internal hemorrhids/normal mocsa in the terminal iluem/left colonis redundant  . COLONOSCOPY W/ POLYPECTOMY  2011  . ENDARTERECTOMY FEMORAL Left 08/16/2017   Procedure: ENDARTERECTOMY LEFT PROFUNDA FEMORAL;  Surgeon: [name], MD;  Location: [place];  Service: Vascular;  Laterality: Left;  . EP IMPLANTABLE DEVICE [name] 08/19/2015   Procedure: BiV ICD Insertion CRT-D;  Surgeon: Evans Lance, MD;  Location: [place] CV LAB;  Service: Cardiovascular;  Laterality: [name];  . ESOPHAGOGASTRODUODENOSCOPY [name] 03/05/2014   SLF: 1. Stricture at the gastroesophagael junction 2. Small hiatal hernia 3. Moderate non-erosive gastritis and duodentitis. 4. No surce for Melena identified.   . FEMORAL-POPLITEAL BYPASS GRAFT Left 08/16/2017   Procedure: LEFT  FEMORAL-POPLITEAL ARTERY  BYPASS GRAFT;  Surgeon: [name], MD;  Location: [place];  Service: Vascular;  Laterality: Left;  . GIVENS CAPSULE STUDY [name] 03/30/2014   Procedure: GIVENS CAPSULE STUDY;  Surgeon: Marga Melnick  Fields, MD;  [facility];  Service: Endoscopy;  Laterality: [name];  7:30  . I&D EXTREMITY Left 09/03/2017   Procedure: IRRIGATION AND DEBRIDEMENT EXTREMITY LEFT THIGH SEROMA;  Surgeon: Conrad [place], MD;  Location: [facility] OR;  Service: Vascular;  Laterality: Left;  . IR ANGIO INTRA EXTRACRAN SEL COM CAROTID INNOMINATE BILAT MOD SED  07/16/2017  . IR ANGIO INTRA EXTRACRAN SEL COM CAROTID INNOMINATE BILAT MOD SED  06/12/2019  . IR ANGIO VERTEBRAL SEL SUBCLAVIAN INNOMINATE BILAT MOD SED  07/16/2017  . IR ANGIO VERTEBRAL SEL SUBCLAVIAN INNOMINATE BILAT MOD SED  06/12/2019  . IR RADIOLOGIST EVAL & MGMT  04/01/2017  . IR RADIOLOGIST EVAL & MGMT  08/02/2017  . IR TRANSCATH EXCRAN VERT OR CAR A STENT  07/22/2019  . IR US GUIDE VASC ACCESS RIGHT  06/12/2019  . LOWER EXTREMITY ANGIOGRAPHY Bilateral 06/25/2017   Procedure: Lower Extremity Angiography;  Surgeon: [name], MD;  Location: [place] CV LAB;   Service: Cardiovascular;  Laterality: Bilateral;  . LUMBAR FUSION  2010  . PERIPHERAL VASCULAR ATHERECTOMY Right 04/22/2018   Procedure: PERIPHERAL VASCULAR ATHERECTOMY;  Surgeon: [name], MD;  Location: [place] CV LAB;  Service: Cardiovascular;  Laterality: Right;  right superficial femoral  . PERIPHERAL VASCULAR BALLOON ANGIOPLASTY Right 04/22/2018   Procedure: PERIPHERAL VASCULAR BALLOON ANGIOPLASTY;  Surgeon: [name], MD;  Location: [place] CV LAB;  Service: Cardiovascular;  Laterality: Right;  external iliac  . RADIOLOGY WITH ANESTHESIA [name] 07/01/2019   Procedure: STENTING;  Surgeon: Luanne Bras, MD;  Location: [place];  Service: Radiology;  Laterality: [name];  . RADIOLOGY WITH ANESTHESIA [name] 07/22/2019   Procedure: STENTING;  Surgeon: Luanne Bras, MD;  Location: [place];  Service: Radiology;  Laterality: [name];  . TOTAL SHOULDER ARTHROPLASTY Right 2011   Dr. Tamera Punt  . ULNAR NERVE TRANSPOSITION Left 02/02/2016   Procedure: LEFT IN-SITU DECOMPRESSION ULNAR NERVE ;  Surgeon: Daryll Brod, MD;  Location: [place];  Service: Orthopedics;  Laterality: Left;  . ULNAR TUNNEL RELEASE Left 02/02/2016   Procedure: LEFT CUBITAL TUNNEL RELEASE;  Surgeon: Daryll Brod, MD;  Location: [place];  Service: Orthopedics;  Laterality: Left;  [name] VASECTOMY  1971  . VEIN HARVEST Left 08/16/2017   Procedure: USING NON REVERSE LEFT GREATER SAPHENOUS VEIN HARVEST;  Surgeon: [name], MD;  Location: [facility] OR;  Service: Vascular;  Laterality: Left;    Social History   Socioeconomic History  . Marital status: Married    Spouse name: Not on file  . Number of children: 2  . Years of education: Not on file  . Highest education level: Not on file  Occupational History  . Occupation: retired, Clinical biochemist    Comment: Programmer, systems: RETIRED  Social Needs  . Financial resource strain: Not on file  . Food insecurity    Worry: Not on file     Inability: Not on file  . Transportation needs    Medical: Not on file    Non-medical: Not on file  Tobacco Use  . Smoking status: Current Every Day Smoker    Packs/day: 1.00    Years: 60.00    Pack years: 60.00    Types: Cigarettes    Start date: 08/20/1957  . Smokeless tobacco: Never Used  Substance and Sexual Activity  . Alcohol use: No    Alcohol/week: 0.0 standard drinks    Comment: quit in July 2015  . Drug use:  No  . Sexual activity: Never  Lifestyle  . Physical activity    Days per week: Not on file    Minutes per session: Not on file  . Stress: Not on file  Relationships  . Social Herbalist on phone: Not on file    Gets together: Not on file    Attends religious service: Not on file    Active member of club or organization: Not on file    Attends meetings of clubs or organizations: Not on file    Relationship status: Not on file  . Intimate partner violence    Fear of current or ex partner: Not on file    Emotionally abused: Not on file    Physically abused: Not on file    Forced sexual activity: Not on file  Other Topics Concern  . Not on file  Social History Narrative   Accompanied by daughter [name]   Lives w/ wife, daughter     Vitals:   07/28/19 1055  BP: (!) 148/65  Pulse: 65  Temp: 97.8 F (36.6 C)  SpO2: 97%  Weight: 149 lb (67.6 kg)  Height: 5\' 3"  (1.6 m)    Wt Readings from Last 3 Encounters:  07/28/19 149 lb (67.6 kg)  07/22/19 160 lb (72.6 kg)  07/01/19 159 lb (72.1 kg)     PHYSICAL EXAM General: NAD HEENT: Normal. Neck: No JVD, no thyromegaly. Lungs: Clear to auscultation bilaterally with normal respiratory effort. CV: Regular rate and rhythm, normal S1/S2, no S3/S4, no murmur. No pretibial or periankle edema.  Left carotid bruit.   Abdomen: Soft, nontender, no distention.  Neurologic: Alert and oriented.  Psych: Normal affect. Skin: Normal. Musculoskeletal: No gross deformities.    ECG: Reviewed above  under Subjective   Labs: Lab Results  Component Value Date/Time   K 3.9 07/22/2019 06:54 AM   K 4.2 04/16/2012 08:07 AM   BUN 13 07/22/2019 06:54 AM   BUN 6 04/16/2012 08:07 AM   CREATININE 0.85 07/22/2019 06:54 AM   CREATININE 0.69 (L) 02/26/2019 08:55 AM   ALT 12 02/26/2019 08:55 AM   TSH 1.650 06/16/2014 12:42 AM   HGB 14.4 07/22/2019 06:54 AM     Lipids: Lab Results  Component Value Date/Time   Gi Or Norman  10/18/2010 07:27 PM    35        Total Cholesterol/HDL:CHD Risk Coronary Heart Disease Risk Table                     Men   Women  1/2 Average Risk   3.4   3.3  Average Risk       5.0   4.4  2 X Average Risk   9.6   7.1  3 X Average Risk  23.4   11.0        Use the calculated Patient Ratio above and the CHD Risk Table to determine the patient's CHD Risk.        ATP III CLASSIFICATION (LDL):  <100     mg/dL   Optimal  100-129  mg/dL   Near or Above                    Optimal  130-159  mg/dL   Borderline  160-189  mg/dL   High  >190     mg/dL   Very High   CHOL 177 01/08/2012 08:09 AM   TRIG 77 06/08/2014 03:31 AM  HDL 53 10/18/2010 07:27 PM       ASSESSMENT AND PLAN: 1.  Coronary artery disease: Stable ischemic heart disease.  Left ventricular systolic function is normal.  Continue aspirin, atorvastatin, and carvedilol.  2.  Chronic systolic heart failure status post biventricular ICD: Continue carvedilol and Entresto.  Left ventricular systolic function normalized as demonstrated by echocardiogram in September 2018 as reviewed above.  3.  Peripheral arterial disease: He is maintained on aspirin, atorvastatin, and Brilinta.  He has had extensive revascularization.  Unfortunately, he continues to smoke and has no desire to quit.  4.  Hyperlipidemia: Continue atorvastatin 20 mg.  I will obtain a copy of lipids from PCP.  5.  Hypertension: Blood pressure is mildly elevated.  No changes to therapy.  6.  Tobacco abuse: Unfortunately, he continues to smoke  and has no desire to quit.    Disposition: Follow up with me in 1 year.  Follow-up with Dr. Lovena Le as scheduled.    Kate Sable, M.D., F.A.C.C.

## 2019-08-04 ENCOUNTER — Telehealth: Payer: Self-pay | Admitting: Student

## 2019-08-04 NOTE — Telephone Encounter (Signed)
NIR.  Received fax request for Brilinta refill. Faxed prescription refill to [facility] [phone]) at [phone] to fill prescription- Brilinta 90 mg tablets, take one tablet by mouth twice daily, dispense 180 tablets with 4 refills.   Bea Graff Jalisia Puchalski, PA-C 08/04/2019, 11:14 AM

## 2019-08-12 DIAGNOSIS — E875 Hyperkalemia: Secondary | ICD-10-CM | POA: Diagnosis not present

## 2019-08-12 DIAGNOSIS — G8929 Other chronic pain: Secondary | ICD-10-CM | POA: Diagnosis not present

## 2019-08-12 DIAGNOSIS — R7301 Impaired fasting glucose: Secondary | ICD-10-CM | POA: Diagnosis not present

## 2019-08-12 DIAGNOSIS — R600 Localized edema: Secondary | ICD-10-CM | POA: Diagnosis not present

## 2019-08-12 DIAGNOSIS — I10 Essential (primary) hypertension: Secondary | ICD-10-CM | POA: Diagnosis not present

## 2019-08-12 DIAGNOSIS — I739 Peripheral vascular disease, unspecified: Secondary | ICD-10-CM | POA: Diagnosis not present

## 2019-08-12 DIAGNOSIS — D509 Iron deficiency anemia, unspecified: Secondary | ICD-10-CM | POA: Diagnosis not present

## 2019-08-12 DIAGNOSIS — K7030 Alcoholic cirrhosis of liver without ascites: Secondary | ICD-10-CM | POA: Diagnosis not present

## 2019-08-12 DIAGNOSIS — M545 Low back pain: Secondary | ICD-10-CM | POA: Diagnosis not present

## 2019-08-12 DIAGNOSIS — E782 Mixed hyperlipidemia: Secondary | ICD-10-CM | POA: Diagnosis not present

## 2019-08-12 DIAGNOSIS — Z720 Tobacco use: Secondary | ICD-10-CM | POA: Diagnosis not present

## 2019-08-12 DIAGNOSIS — I5022 Chronic systolic (congestive) heart failure: Secondary | ICD-10-CM | POA: Diagnosis not present

## 2019-08-12 NOTE — Telephone Encounter (Signed)
Error

## 2019-09-07 DIAGNOSIS — I5022 Chronic systolic (congestive) heart failure: Secondary | ICD-10-CM | POA: Diagnosis not present

## 2019-09-07 DIAGNOSIS — G8929 Other chronic pain: Secondary | ICD-10-CM | POA: Diagnosis not present

## 2019-09-07 DIAGNOSIS — E782 Mixed hyperlipidemia: Secondary | ICD-10-CM | POA: Diagnosis not present

## 2019-09-07 DIAGNOSIS — K7030 Alcoholic cirrhosis of liver without ascites: Secondary | ICD-10-CM | POA: Diagnosis not present

## 2019-09-07 DIAGNOSIS — I739 Peripheral vascular disease, unspecified: Secondary | ICD-10-CM | POA: Diagnosis not present

## 2019-09-07 DIAGNOSIS — R7301 Impaired fasting glucose: Secondary | ICD-10-CM | POA: Diagnosis not present

## 2019-09-07 DIAGNOSIS — M545 Low back pain: Secondary | ICD-10-CM | POA: Diagnosis not present

## 2019-09-07 DIAGNOSIS — Z720 Tobacco use: Secondary | ICD-10-CM | POA: Diagnosis not present

## 2019-09-07 DIAGNOSIS — D509 Iron deficiency anemia, unspecified: Secondary | ICD-10-CM | POA: Diagnosis not present

## 2019-09-07 DIAGNOSIS — R600 Localized edema: Secondary | ICD-10-CM | POA: Diagnosis not present

## 2019-09-07 DIAGNOSIS — I10 Essential (primary) hypertension: Secondary | ICD-10-CM | POA: Diagnosis not present

## 2019-09-07 DIAGNOSIS — E875 Hyperkalemia: Secondary | ICD-10-CM | POA: Diagnosis not present

## 2019-10-01 DIAGNOSIS — I5022 Chronic systolic (congestive) heart failure: Secondary | ICD-10-CM | POA: Diagnosis not present

## 2019-10-01 DIAGNOSIS — I10 Essential (primary) hypertension: Secondary | ICD-10-CM | POA: Diagnosis not present

## 2019-10-01 DIAGNOSIS — E782 Mixed hyperlipidemia: Secondary | ICD-10-CM | POA: Diagnosis not present

## 2019-10-01 DIAGNOSIS — D509 Iron deficiency anemia, unspecified: Secondary | ICD-10-CM | POA: Diagnosis not present

## 2019-10-05 ENCOUNTER — Encounter: Payer: Self-pay | Admitting: Gastroenterology

## 2019-10-05 ENCOUNTER — Telehealth: Payer: Self-pay | Admitting: Gastroenterology

## 2019-10-05 NOTE — Telephone Encounter (Signed)
Recall sent 

## 2019-10-05 NOTE — Telephone Encounter (Signed)
Recall for ultrasound 

## 2019-10-07 ENCOUNTER — Ambulatory Visit (INDEPENDENT_AMBULATORY_CARE_PROVIDER_SITE_OTHER): Payer: Medicare Other | Admitting: *Deleted

## 2019-10-07 DIAGNOSIS — I447 Left bundle-branch block, unspecified: Secondary | ICD-10-CM

## 2019-10-07 DIAGNOSIS — I5022 Chronic systolic (congestive) heart failure: Secondary | ICD-10-CM | POA: Diagnosis not present

## 2019-10-07 LAB — CUP PACEART REMOTE DEVICE CHECK
Battery Remaining Longevity: 37 mo
Battery Remaining Percentage: 46 %
Battery Voltage: 2.92 V
Brady Statistic AP VP Percent: 27 %
Brady Statistic AP VS Percent: 1 %
Brady Statistic AS VP Percent: 71 %
Brady Statistic AS VS Percent: 1 %
Brady Statistic RA Percent Paced: 27 %
Date Time Interrogation Session: 20201109070015
HighPow Impedance: 84 Ohm
HighPow Impedance: 84 Ohm
Implantable Lead Implant Date: 20160923
Implantable Lead Implant Date: 20160923
Implantable Lead Implant Date: 20160923
Implantable Lead Location: 753858
Implantable Lead Location: 753859
Implantable Lead Location: 753860
Implantable Lead Model: 7122
Implantable Pulse Generator Implant Date: 20160923
Lead Channel Impedance Value: 440 Ohm
Lead Channel Impedance Value: 460 Ohm
Lead Channel Impedance Value: 710 Ohm
Lead Channel Pacing Threshold Amplitude: 0.75 V
Lead Channel Pacing Threshold Amplitude: 0.875 V
Lead Channel Pacing Threshold Amplitude: 1.5 V
Lead Channel Pacing Threshold Pulse Width: 0.5 ms
Lead Channel Pacing Threshold Pulse Width: 0.5 ms
Lead Channel Pacing Threshold Pulse Width: 0.8 ms
Lead Channel Sensing Intrinsic Amplitude: 12 mV
Lead Channel Sensing Intrinsic Amplitude: 4.6 mV
Lead Channel Setting Pacing Amplitude: 2 V
Lead Channel Setting Pacing Amplitude: 2 V
Lead Channel Setting Pacing Amplitude: 2.25 V
Lead Channel Setting Pacing Pulse Width: 0.5 ms
Lead Channel Setting Pacing Pulse Width: 0.8 ms
Lead Channel Setting Sensing Sensitivity: 0.5 mV
Pulse Gen Serial Number: 7294918

## 2019-10-19 DIAGNOSIS — I10 Essential (primary) hypertension: Secondary | ICD-10-CM | POA: Diagnosis not present

## 2019-10-19 DIAGNOSIS — R7301 Impaired fasting glucose: Secondary | ICD-10-CM | POA: Diagnosis not present

## 2019-10-19 DIAGNOSIS — E782 Mixed hyperlipidemia: Secondary | ICD-10-CM | POA: Diagnosis not present

## 2019-10-19 DIAGNOSIS — D509 Iron deficiency anemia, unspecified: Secondary | ICD-10-CM | POA: Diagnosis not present

## 2019-10-26 ENCOUNTER — Other Ambulatory Visit: Payer: Self-pay

## 2019-10-26 DIAGNOSIS — K7030 Alcoholic cirrhosis of liver without ascites: Secondary | ICD-10-CM

## 2019-10-28 NOTE — Progress Notes (Signed)
Remote ICD transmission.   

## 2019-11-01 NOTE — Progress Notes (Signed)
REVIEWED-NO ADDITIONAL RECOMMENDATIONS. 

## 2019-11-03 DIAGNOSIS — D509 Iron deficiency anemia, unspecified: Secondary | ICD-10-CM | POA: Diagnosis not present

## 2019-11-03 DIAGNOSIS — E875 Hyperkalemia: Secondary | ICD-10-CM | POA: Diagnosis not present

## 2019-11-03 DIAGNOSIS — Z720 Tobacco use: Secondary | ICD-10-CM | POA: Diagnosis not present

## 2019-11-03 DIAGNOSIS — I10 Essential (primary) hypertension: Secondary | ICD-10-CM | POA: Diagnosis not present

## 2019-11-03 DIAGNOSIS — I739 Peripheral vascular disease, unspecified: Secondary | ICD-10-CM | POA: Diagnosis not present

## 2019-11-03 DIAGNOSIS — R600 Localized edema: Secondary | ICD-10-CM | POA: Diagnosis not present

## 2019-11-03 DIAGNOSIS — R7301 Impaired fasting glucose: Secondary | ICD-10-CM | POA: Diagnosis not present

## 2019-11-03 DIAGNOSIS — K7030 Alcoholic cirrhosis of liver without ascites: Secondary | ICD-10-CM | POA: Diagnosis not present

## 2019-11-03 DIAGNOSIS — M545 Low back pain: Secondary | ICD-10-CM | POA: Diagnosis not present

## 2019-11-03 DIAGNOSIS — I5022 Chronic systolic (congestive) heart failure: Secondary | ICD-10-CM | POA: Diagnosis not present

## 2019-11-03 DIAGNOSIS — E782 Mixed hyperlipidemia: Secondary | ICD-10-CM | POA: Diagnosis not present

## 2019-11-03 DIAGNOSIS — G8929 Other chronic pain: Secondary | ICD-10-CM | POA: Diagnosis not present

## 2019-11-11 LAB — HEPATITIS C ANTIBODY
Hepatitis C Ab: NONREACTIVE
SIGNAL TO CUT-OFF: 0.01 (ref <1.00)

## 2019-11-11 LAB — CBC WITH DIFFERENTIAL/PLATELET
Absolute Monocytes: 686 cells/uL (ref 200–950)
Basophils Absolute: 51 cells/uL (ref 0–200)
Basophils Relative: 0.7 %
Eosinophils Absolute: 679 cells/uL — ABNORMAL HIGH (ref 15–500)
Eosinophils Relative: 9.3 %
HCT: 41 % (ref 38.5–50.0)
Hemoglobin: 13.9 g/dL (ref 13.2–17.1)
Lymphs Abs: 1818 cells/uL (ref 850–3900)
MCH: 31.6 pg (ref 27.0–33.0)
MCHC: 33.9 g/dL (ref 32.0–36.0)
MCV: 93.2 fL (ref 80.0–100.0)
MPV: 11.2 fL (ref 7.5–12.5)
Monocytes Relative: 9.4 %
Neutro Abs: 4066 cells/uL (ref 1500–7800)
Neutrophils Relative %: 55.7 %
Platelets: 243 10*3/uL (ref 140–400)
RBC: 4.4 10*6/uL (ref 4.20–5.80)
RDW: 11.9 % (ref 11.0–15.0)
Total Lymphocyte: 24.9 %
WBC: 7.3 10*3/uL (ref 3.8–10.8)

## 2019-11-11 LAB — COMPREHENSIVE METABOLIC PANEL
AG Ratio: 1.7 (calc) (ref 1.0–2.5)
ALT: 13 U/L (ref 9–46)
AST: 18 U/L (ref 10–35)
Albumin: 4.5 g/dL (ref 3.6–5.1)
Alkaline phosphatase (APISO): 79 U/L (ref 35–144)
BUN/Creatinine Ratio: 12 (calc) (ref 6–22)
BUN: 8 mg/dL (ref 7–25)
CO2: 28 mmol/L (ref 20–32)
Calcium: 9.2 mg/dL (ref 8.6–10.3)
Chloride: 100 mmol/L (ref 98–110)
Creat: 0.67 mg/dL — ABNORMAL LOW (ref 0.70–1.18)
Globulin: 2.6 g/dL (calc) (ref 1.9–3.7)
Glucose, Bld: 100 mg/dL — ABNORMAL HIGH (ref 65–99)
Potassium: 4.1 mmol/L (ref 3.5–5.3)
Sodium: 136 mmol/L (ref 135–146)
Total Bilirubin: 0.4 mg/dL (ref 0.2–1.2)
Total Protein: 7.1 g/dL (ref 6.1–8.1)

## 2019-11-11 LAB — PROTIME-INR
INR: 1
Prothrombin Time: 10.8 s (ref 9.0–11.5)

## 2019-11-11 LAB — HEPATITIS A ANTIBODY, TOTAL: Hepatitis A AB,Total: NONREACTIVE

## 2019-11-11 LAB — HEPATITIS B SURFACE ANTIGEN: Hepatitis B Surface Ag: NONREACTIVE

## 2019-11-11 LAB — HEPATITIS B SURFACE ANTIBODY,QUALITATIVE: Hep B S Ab: NONREACTIVE

## 2019-12-03 DIAGNOSIS — I739 Peripheral vascular disease, unspecified: Secondary | ICD-10-CM | POA: Diagnosis not present

## 2019-12-03 DIAGNOSIS — E782 Mixed hyperlipidemia: Secondary | ICD-10-CM | POA: Diagnosis not present

## 2019-12-03 DIAGNOSIS — R7301 Impaired fasting glucose: Secondary | ICD-10-CM | POA: Diagnosis not present

## 2019-12-03 DIAGNOSIS — K7030 Alcoholic cirrhosis of liver without ascites: Secondary | ICD-10-CM | POA: Diagnosis not present

## 2019-12-03 DIAGNOSIS — Z720 Tobacco use: Secondary | ICD-10-CM | POA: Diagnosis not present

## 2019-12-03 DIAGNOSIS — I110 Hypertensive heart disease with heart failure: Secondary | ICD-10-CM | POA: Diagnosis not present

## 2019-12-03 DIAGNOSIS — E875 Hyperkalemia: Secondary | ICD-10-CM | POA: Diagnosis not present

## 2019-12-03 DIAGNOSIS — D509 Iron deficiency anemia, unspecified: Secondary | ICD-10-CM | POA: Diagnosis not present

## 2019-12-03 DIAGNOSIS — I10 Essential (primary) hypertension: Secondary | ICD-10-CM | POA: Diagnosis not present

## 2019-12-03 DIAGNOSIS — I5022 Chronic systolic (congestive) heart failure: Secondary | ICD-10-CM | POA: Diagnosis not present

## 2019-12-23 ENCOUNTER — Other Ambulatory Visit: Payer: Self-pay

## 2019-12-23 ENCOUNTER — Encounter: Payer: Self-pay | Admitting: Internal Medicine

## 2019-12-23 ENCOUNTER — Ambulatory Visit (INDEPENDENT_AMBULATORY_CARE_PROVIDER_SITE_OTHER): Payer: Medicare Other | Admitting: Internal Medicine

## 2019-12-23 VITALS — BP 152/68 | HR 65 | Temp 98.9°F | Ht 63.0 in | Wt 153.2 lb

## 2019-12-23 DIAGNOSIS — I779 Disorder of arteries and arterioles, unspecified: Secondary | ICD-10-CM

## 2019-12-23 DIAGNOSIS — I5022 Chronic systolic (congestive) heart failure: Secondary | ICD-10-CM | POA: Diagnosis not present

## 2019-12-23 DIAGNOSIS — I25118 Atherosclerotic heart disease of native coronary artery with other forms of angina pectoris: Secondary | ICD-10-CM | POA: Diagnosis not present

## 2019-12-23 DIAGNOSIS — Z95810 Presence of automatic (implantable) cardiac defibrillator: Secondary | ICD-10-CM | POA: Diagnosis not present

## 2019-12-23 NOTE — Patient Instructions (Signed)
Medication Instructions:  Your physician recommends that you continue on your current medications as directed. Please refer to the Current Medication list given to you today.  *If you need a refill on your cardiac medications before your next appointment, please call your pharmacy*  Lab Work: NONE  If you have labs (blood work) drawn today and your tests are completely normal, you will receive your results only by: . MyChart Message (if you have MyChart) OR . A paper copy in the mail If you have any lab test that is abnormal or we need to change your treatment, we will call you to review the results.  Testing/Procedures: NONE   Follow-Up: At [facility], you and your health needs are our priority.  As part of our continuing mission to provide you with exceptional heart care, we have created designated Provider Care Teams.  These Care Teams include your primary Cardiologist (physician) and Advanced Practice Providers (APPs -  Physician Assistants and Nurse Practitioners) who all work together to provide you with the care you need, when you need it.  Your next appointment:   1 year(s)  The format for your next appointment:   In Person  Provider:   Gregg Taylor, MD  Other Instructions Thank you for choosing [facility]!    

## 2019-12-23 NOTE — Progress Notes (Signed)
HPI Mr. Adamsreturns today for ongoing evaluation of his ICD, chronic systolic heart failure, coronary artery disease, and COPD. In the interim, he has  His claudication is much improved in the left leg but worse in the right leg and limits his activity. Unfortunately he continues to smoke cigarettes, one pack per day. He denies anginal symptoms, shortness of breath, syncope, or any ICD shock. He is still smoking however. No Known Allergies   Current Outpatient Medications  Medication Sig Dispense Refill  . acetaminophen (TYLENOL) 500 MG tablet Take 1,000 mg by mouth every 6 (six) hours as needed for mild pain or headache.     [name] aspirin EC 81 MG tablet Take 81 mg by mouth every evening.     [name] atorvastatin (LIPITOR) 20 MG tablet Take 20 mg by mouth daily.     [name] BRILINTA 90 MG TABS tablet Take 90 mg by mouth 2 (two) times daily.    . carvedilol (COREG) 3.125 MG tablet TAKE 1 TABLET TWICE A DAY (Patient taking differently: Take 3.125 mg by mouth 2 (two) times daily with a meal. ) 180 tablet 0  . dextromethorphan-guaiFENesin (MUCINEX DM) 30-600 MG 12hr tablet Take 1 tablet by mouth every evening.     [name] ENTRESTO 49-51 MG TAKE 1 TABLET TWICE A DAY (Patient taking differently: Take 1 tablet by mouth 2 (two) times daily. ) 180 tablet 4  . Ferrous Sulfate 27 MG TABS Take 27 mg by mouth every Monday, Wednesday, and Friday.    . gabapentin (NEURONTIN) 300 MG capsule Take 300 mg by mouth 2 (two) times daily.     . Multiple Vitamins-Minerals (CENTRUM SILVER PO) Take 1 tablet by mouth daily.     [name] omega-3 acid ethyl esters (LOVAZA) 1 g capsule Take 1 g by mouth 2 (two) times daily.    . pantoprazole (PROTONIX) 40 MG tablet TAKE 1 TABLET DAILY (Patient taking differently: Take 40 mg by mouth daily. ) 90 tablet 4  . Potassium 99 MG TABS Take 99 mg by mouth every Monday, Wednesday, and Friday.     . SUPER B COMPLEX/C PO Take 1 tablet by mouth at bedtime.     . tamsulosin (FLOMAX) 0.4 MG CAPS capsule  Take 0.4 mg by mouth 2 (two) times daily.     . Tetrahydrozoline HCl (VISINE OP) Place 1 drop into both eyes daily as needed (dry eyes).     No current facility-administered medications for this visit.     Past Medical History:  Diagnosis Date  . AICD (automatic cardioverter/defibrillator) present 08/19/2015   St Jude BiV ICD for primary prevention by Dr. Lovena Le  . Alcohol abuse    6 beers per day; hospital admission in 2009 for withdrawal symptoms  . Alcoholic cirrhosis ([facility])   . Anxiety and depression    denies   . Cerebrovascular disease 2009   TIA; 2009- right ICA stent; re-intervention for restenosis complicated by [facility] w/o sx  . CHF (congestive heart failure) ([place]) 06-02-14  . Chronic obstructive pulmonary disease ([place])   . Degenerative joint disease    Total shoulder arthroplasty-right  . Hyperlipidemia   . Hypertension   . LBBB (left bundle branch block)    Normal echo-2011; stress nuclear in 09/2010--septal hypoperfusion representing nontransmural infarction or the effect of left bundle branch block, no ischemia  . Myocardial infarction [place]) June 02, 2014   Massive Heart Attack  . Peripheral vascular disease ([place])   . Presence of permanent cardiac  pacemaker 08/19/2015  . Thrombocytopenia ([place])   . Tobacco abuse    -100 pack years; 1.5 packs per day  . Traumatic seroma of thigh ([place])    left  . Tubular adenoma of colon     ROS:   All systems reviewed and negative except as noted in the HPI.   Past Surgical History:  Procedure Laterality Date  . ABDOMINAL AORTAGRAM [name] 05/04/2014   Procedure: ABDOMINAL Maxcine Ham;  Surgeon: [name], MD;  Location: [facility] CATH LAB;  Service: Cardiovascular;  Laterality: [name];  . ABDOMINAL AORTOGRAM [name] 06/25/2017   Procedure: Abdominal Aortogram;  Surgeon: [name], MD;  Location: [place] CV LAB;  Service: Cardiovascular;  Laterality: [name];  . ABDOMINAL AORTOGRAM W/LOWER EXTREMITY [name] 04/22/2018   Procedure: ABDOMINAL  AORTOGRAM W/LOWER EXTREMITY;  Surgeon: [name], MD;  Location: [place] CV LAB;  Service: Cardiovascular;  Laterality: [name];  . AGILE CAPSULE [name] 03/18/2014   Procedure: AGILE CAPSULE;  Surgeon: Danie Binder, MD;  [facility];  Service: Endoscopy;  Laterality: [name];  7:30  . APPLICATION OF WOUND VAC Left 09/03/2017   Procedure: APPLICATION OF WOUND VAC;  Surgeon: Conrad Maurice, MD;  Location: [place];  Service: Vascular;  Laterality: Left;  . BACTERIAL OVERGROWTH TEST [name] 05/24/2015   Procedure: BACTERIAL OVERGROWTH TEST;  Surgeon: Danie Binder, MD;  [facility];  Service: Endoscopy;  Laterality: [name];  0700  . BI-VENTRICULAR IMPLANTABLE CARDIOVERTER DEFIBRILLATOR  (CRT-D)  08/19/2015  . CARPAL TUNNEL RELEASE Left 02/02/2016   Procedure: LEFT CARPAL TUNNEL RELEASE;  Surgeon: Daryll Brod, MD;  Location: [place];  Service: Orthopedics;  Laterality: Left;  ANESTHESIA: IV REGIONAL UPPER ARM  . CATARACT EXTRACTION W/PHACO Right 03/08/2015   Procedure: CATARACT EXTRACTION PHACO AND INTRAOCULAR LENS PLACEMENT (IOC);  Surgeon: Rutherford Guys, MD;  [facility]: [facility];  Service: Ophthalmology;  Laterality: Right;  CDE:9.46  . CATARACT EXTRACTION W/PHACO Left 03/22/2015   Procedure: CATARACT EXTRACTION PHACO AND INTRAOCULAR LENS PLACEMENT (IOC);  Surgeon: Rutherford Guys, MD;  [facility]: [facility];  Service: Ophthalmology;  Laterality: Left;  CDE:5.80  . COLONOSCOPY  08/22/09   Fields-(Tubular Adenoma)3-mm transverse polyp/4-mm polyp otherwise noraml/small internal hemorrhoids  . COLONOSCOPY [name] 04/14/2014   [id number] internal hemorrhids/normal mocsa in the terminal iluem/left colonis redundant  . COLONOSCOPY W/ POLYPECTOMY  2011  . ENDARTERECTOMY FEMORAL Left 08/16/2017   Procedure: ENDARTERECTOMY LEFT PROFUNDA FEMORAL;  Surgeon: [name], MD;  Location: [place];  Service: Vascular;  Laterality: Left;  . EP IMPLANTABLE DEVICE [name] 08/19/2015   Procedure: BiV ICD  Insertion CRT-D;  Surgeon: Evans Lance, MD;  Location: [place] CV LAB;  Service: Cardiovascular;  Laterality: [name];  . ESOPHAGOGASTRODUODENOSCOPY [name] 03/05/2014   SLF: 1. Stricture at the gastroesophagael junction 2. Small hiatal hernia 3. Moderate non-erosive gastritis and duodentitis. 4. No surce for Melena identified.   . FEMORAL-POPLITEAL BYPASS GRAFT Left 08/16/2017   Procedure: LEFT  FEMORAL-POPLITEAL ARTERY  BYPASS GRAFT;  Surgeon: [name], MD;  Location: [place];  Service: Vascular;  Laterality: Left;  . GIVENS CAPSULE STUDY [name] 03/30/2014   Procedure: GIVENS CAPSULE STUDY;  Surgeon: Danie Binder, MD;  [facility];  Service: Endoscopy;  Laterality: [name];  7:30  . I & D EXTREMITY Left 09/03/2017   Procedure: IRRIGATION AND DEBRIDEMENT EXTREMITY LEFT THIGH SEROMA;  Surgeon: Conrad , MD;  Location: [place];  Service: Vascular;  Laterality: Left;  .  IR ANGIO INTRA EXTRACRAN SEL COM CAROTID INNOMINATE BILAT MOD SED  07/16/2017  . IR ANGIO INTRA EXTRACRAN SEL COM CAROTID INNOMINATE BILAT MOD SED  06/12/2019  . IR ANGIO VERTEBRAL SEL SUBCLAVIAN INNOMINATE BILAT MOD SED  07/16/2017  . IR ANGIO VERTEBRAL SEL SUBCLAVIAN INNOMINATE BILAT MOD SED  06/12/2019  . IR RADIOLOGIST EVAL & MGMT  04/01/2017  . IR RADIOLOGIST EVAL & MGMT  08/02/2017  . IR TRANSCATH EXCRAN VERT OR CAR A STENT  07/22/2019  . IR US GUIDE VASC ACCESS RIGHT  06/12/2019  . LOWER EXTREMITY ANGIOGRAPHY Bilateral 06/25/2017   Procedure: Lower Extremity Angiography;  Surgeon: [name], MD;  Location: [place] CV LAB;  Service: Cardiovascular;  Laterality: Bilateral;  . LUMBAR FUSION  2010  . PERIPHERAL VASCULAR ATHERECTOMY Right 04/22/2018   Procedure: PERIPHERAL VASCULAR ATHERECTOMY;  Surgeon: [name], MD;  Location: [place] CV LAB;  Service: Cardiovascular;  Laterality: Right;  right superficial femoral  . PERIPHERAL VASCULAR BALLOON ANGIOPLASTY Right 04/22/2018   Procedure: PERIPHERAL VASCULAR  BALLOON ANGIOPLASTY;  Surgeon: [name], MD;  Location: [place] CV LAB;  Service: Cardiovascular;  Laterality: Right;  external iliac  . RADIOLOGY WITH ANESTHESIA [name] 07/01/2019   Procedure: STENTING;  Surgeon: Luanne Bras, MD;  Location: [place];  Service: Radiology;  Laterality: [name];  . RADIOLOGY WITH ANESTHESIA [name] 07/22/2019   Procedure: STENTING;  Surgeon: Luanne Bras, MD;  Location: [place];  Service: Radiology;  Laterality: [name];  . TOTAL SHOULDER ARTHROPLASTY Right 2011   Dr. Tamera Punt  . ULNAR NERVE TRANSPOSITION Left 02/02/2016   Procedure: LEFT IN-SITU DECOMPRESSION ULNAR NERVE ;  Surgeon: Daryll Brod, MD;  Location: [place];  Service: Orthopedics;  Laterality: Left;  . ULNAR TUNNEL RELEASE Left 02/02/2016   Procedure: LEFT CUBITAL TUNNEL RELEASE;  Surgeon: Daryll Brod, MD;  Location: [place];  Service: Orthopedics;  Laterality: Left;  [name] VASECTOMY  1971  . VEIN HARVEST Left 08/16/2017   Procedure: USING NON REVERSE LEFT GREATER SAPHENOUS VEIN HARVEST;  Surgeon: [name], MD;  [facility];  Service: Vascular;  Laterality: Left;     Family History  Problem Relation Age of Onset  . Coronary artery disease Mother   . Diabetes Mother   . Heart disease Mother        Before age 24 - 80 Bypasses  . Hypertension Mother   . Heart attack Mother        3-4 Heart attacks  . Alzheimer's disease Father   . Diabetes Father   . Heart attack Sister   . Cancer Brother        Prostate  . Hyperlipidemia Son   . Prostate cancer Brother   . Alzheimer's disease Sister   . Colon cancer Neg Hx      Social History   Socioeconomic History  . Marital status: Married    Spouse name: Not on file  . Number of children: 2  . Years of education: Not on file  . Highest education level: Not on file  Occupational History  . Occupation: retired, Clinical biochemist    Comment: Programmer, systems: RETIRED  Tobacco Use  . Smoking status:  Current Every Day Smoker    Packs/day: 1.00    Years: 60.00    Pack years: 60.00    Types: Cigarettes    Start date: 08/20/1957  . Smokeless tobacco: Never Used  Substance and Sexual Activity  . Alcohol use:  No    Alcohol/week: 0.0 standard drinks    Comment: quit in July 2015  . Drug use: No  . Sexual activity: Never  Other Topics Concern  . Not on file  Social History Narrative   Accompanied by daughter [name]   Lives w/ wife, daughter   Social Determinants of Health   Financial Resource Strain:   . Difficulty of Paying Living Expenses: Not on file  Food Insecurity:   . Worried About Charity fundraiser in the Last Year: Not on file  . Ran Out of Food in the Last Year: Not on file  Transportation Needs:   . Lack of Transportation (Medical): Not on file  . Lack of Transportation (Non-Medical): Not on file  Physical Activity:   . Days of Exercise per Week: Not on file  . Minutes of Exercise per Session: Not on file  Stress:   . Feeling of Stress : Not on file  Social Connections:   . Frequency of Communication with Friends and Family: Not on file  . Frequency of Social Gatherings with Friends and Family: Not on file  . Attends Religious Services: Not on file  . Active Member of Clubs or Organizations: Not on file  . Attends Archivist Meetings: Not on file  . Marital Status: Not on file  Intimate Partner Violence:   . Fear of Current or Ex-Partner: Not on file  . Emotionally Abused: Not on file  . Physically Abused: Not on file  . Sexually Abused: Not on file     BP (!) 152/68   Pulse 65   Temp 98.9 F (37.2 C)   Ht 5\' 3"  (1.6 m)   Wt 153 lb 3.2 oz (69.5 kg)   SpO2 97%   BMI 27.14 kg/m   Physical Exam:  Well appearing 74 yo man, NAD HEENT: Unremarkable Neck:  6 cm JVD, no thyromegally Lymphatics:  No adenopathy Back:  No CVA tenderness Lungs:  Clear with no wheezes HEART:  Regular rate rhythm, no murmurs, no rubs, no clicks Abd:   soft, positive bowel sounds, no organomegally, no rebound, no guarding Ext:  2 plus pulses, no edema, no cyanosis, no clubbing Skin:  No rashes no nodules Neuro:  CN II through XII intact, motor grossly intact   DEVICE  Normal device function.  P/R waves are 4/12. Impedence was 510, 830, and 450 in the RA/RV/LV. Thresholds were 1.5 at 0.8 in the LV and less than 1 in the RA and RV.   Assess/Plan: 1. Chronic systolic heart failure - his symptoms are class 2. He is not limited by dyspnea.  2. Claudication - this limits his walking, especially up hills. He has continued to smoke. He has not had any sores or ulcers. He is pending a visit with Dr. [name] 3. ICD - his St. Jude Biv ICD was interrogated by me today with results as above. He has about 3 years of battery longevity. 4. Tobacco abuse - I discussed ways to stop smoking.

## 2020-01-04 DIAGNOSIS — D509 Iron deficiency anemia, unspecified: Secondary | ICD-10-CM | POA: Diagnosis not present

## 2020-01-04 DIAGNOSIS — E7849 Other hyperlipidemia: Secondary | ICD-10-CM | POA: Diagnosis not present

## 2020-01-04 DIAGNOSIS — E782 Mixed hyperlipidemia: Secondary | ICD-10-CM | POA: Diagnosis not present

## 2020-01-04 DIAGNOSIS — I5022 Chronic systolic (congestive) heart failure: Secondary | ICD-10-CM | POA: Diagnosis not present

## 2020-01-04 DIAGNOSIS — I10 Essential (primary) hypertension: Secondary | ICD-10-CM | POA: Diagnosis not present

## 2020-01-04 DIAGNOSIS — I129 Hypertensive chronic kidney disease with stage 1 through stage 4 chronic kidney disease, or unspecified chronic kidney disease: Secondary | ICD-10-CM | POA: Diagnosis not present

## 2020-01-06 ENCOUNTER — Ambulatory Visit (INDEPENDENT_AMBULATORY_CARE_PROVIDER_SITE_OTHER): Payer: Medicare Other | Admitting: *Deleted

## 2020-01-06 DIAGNOSIS — I5022 Chronic systolic (congestive) heart failure: Secondary | ICD-10-CM | POA: Diagnosis not present

## 2020-01-08 LAB — CUP PACEART REMOTE DEVICE CHECK
Battery Remaining Longevity: 35 mo
Battery Remaining Percentage: 43 %
Battery Voltage: 2.92 V
Brady Statistic AP VP Percent: 28 %
Brady Statistic AP VS Percent: 1 %
Brady Statistic AS VP Percent: 71 %
Brady Statistic AS VS Percent: 1 %
Brady Statistic RA Percent Paced: 27 %
Date Time Interrogation Session: 20210212013231
HighPow Impedance: 82 Ohm
HighPow Impedance: 82 Ohm
Implantable Lead Implant Date: 20160923
Implantable Lead Implant Date: 20160923
Implantable Lead Implant Date: 20160923
Implantable Lead Location: 753858
Implantable Lead Location: 753859
Implantable Lead Location: 753860
Implantable Lead Model: 7122
Implantable Pulse Generator Implant Date: 20160923
Lead Channel Impedance Value: 440 Ohm
Lead Channel Impedance Value: 480 Ohm
Lead Channel Impedance Value: 740 Ohm
Lead Channel Pacing Threshold Amplitude: 0.75 V
Lead Channel Pacing Threshold Amplitude: 0.875 V
Lead Channel Pacing Threshold Amplitude: 1.5 V
Lead Channel Pacing Threshold Pulse Width: 0.5 ms
Lead Channel Pacing Threshold Pulse Width: 0.5 ms
Lead Channel Pacing Threshold Pulse Width: 0.8 ms
Lead Channel Sensing Intrinsic Amplitude: 12 mV
Lead Channel Sensing Intrinsic Amplitude: 4.6 mV
Lead Channel Setting Pacing Amplitude: 2 V
Lead Channel Setting Pacing Amplitude: 2 V
Lead Channel Setting Pacing Amplitude: 2.25 V
Lead Channel Setting Pacing Pulse Width: 0.5 ms
Lead Channel Setting Pacing Pulse Width: 0.8 ms
Lead Channel Setting Sensing Sensitivity: 0.5 mV
Pulse Gen Serial Number: 7294918

## 2020-01-08 NOTE — Progress Notes (Signed)
ICD Remote  

## 2020-03-01 ENCOUNTER — Telehealth (HOSPITAL_COMMUNITY): Payer: Self-pay

## 2020-03-01 NOTE — Telephone Encounter (Signed)

## 2020-03-02 ENCOUNTER — Other Ambulatory Visit: Payer: Self-pay | Admitting: *Deleted

## 2020-03-02 DIAGNOSIS — I739 Peripheral vascular disease, unspecified: Secondary | ICD-10-CM

## 2020-03-02 DIAGNOSIS — I70213 Atherosclerosis of native arteries of extremities with intermittent claudication, bilateral legs: Secondary | ICD-10-CM

## 2020-03-03 ENCOUNTER — Ambulatory Visit (INDEPENDENT_AMBULATORY_CARE_PROVIDER_SITE_OTHER): Payer: Medicare Other | Admitting: Physician Assistant

## 2020-03-03 ENCOUNTER — Ambulatory Visit (HOSPITAL_COMMUNITY)
Admission: RE | Admit: 2020-03-03 | Discharge: 2020-03-03 | Disposition: A | Discharge status: Home / Self Care | Payer: Medicare Other | Source: Ambulatory Visit | Attending: Vascular Surgery | Admitting: Vascular Surgery

## 2020-03-03 ENCOUNTER — Other Ambulatory Visit: Payer: Self-pay

## 2020-03-03 ENCOUNTER — Ambulatory Visit (INDEPENDENT_AMBULATORY_CARE_PROVIDER_SITE_OTHER)
Admission: RE | Admit: 2020-03-03 | Discharge: 2020-03-03 | Disposition: A | Discharge status: Home / Self Care | Payer: Medicare Other | Source: Ambulatory Visit | Attending: Vascular Surgery | Admitting: Vascular Surgery

## 2020-03-03 VITALS — BP 159/72 | HR 60 | Temp 98.2°F | Resp 16 | Ht 63.0 in | Wt 149.0 lb

## 2020-03-03 DIAGNOSIS — Z720 Tobacco use: Secondary | ICD-10-CM | POA: Diagnosis not present

## 2020-03-03 DIAGNOSIS — I739 Peripheral vascular disease, unspecified: Secondary | ICD-10-CM | POA: Diagnosis not present

## 2020-03-03 DIAGNOSIS — I70213 Atherosclerosis of native arteries of extremities with intermittent claudication, bilateral legs: Secondary | ICD-10-CM

## 2020-03-03 DIAGNOSIS — I779 Disorder of arteries and arterioles, unspecified: Secondary | ICD-10-CM

## 2020-03-03 NOTE — H&P (View-Only) (Signed)
Established Intermittent Claudication   History of Present Illness   Roy [name] is a 74 y.o. (Jun 30, 1946) male who presents to go over BLE arterial studies.  He has history of bilateral iliac stenting in 2015 which was done for lifestyle limiting claudication.  He also underwent left profunda femoral endarterectomy and femoral to above-the-knee popliteal bypass with vein on 08/16/2017.  He also underwent atherectomy and balloon angioplasty of right SFA as well as angioplasty of right external iliac artery on 04/22/2018.  He returns to clinic today complaining of lifestyle limiting claudication of right lower extremity especially when walking uphill or climbing ladders.  He denies any rest pain or nonhealing wounds of bilateral lower extremities.  He continues to smoke and has no interest in quitting.  He is taking aspirin and Brilinta.  He is also on a statin for hypercholesterolemia.  The patient's PMH, PSH, SH, and FamHx were reviewed and are unchanged from prior visit.  Current Outpatient Medications  Medication Sig Dispense Refill  . acetaminophen (TYLENOL) 500 MG tablet Take 1,000 mg by mouth every 6 (six) hours as needed for mild pain or headache.     [name] aspirin EC 81 MG tablet Take 81 mg by mouth every evening.     [name] atorvastatin (LIPITOR) 20 MG tablet Take 20 mg by mouth daily.     [name] BRILINTA 90 MG TABS tablet Take 90 mg by mouth 2 (two) times daily.    . carvedilol (COREG) 3.125 MG tablet TAKE 1 TABLET TWICE A DAY (Patient taking differently: Take 3.125 mg by mouth 2 (two) times daily with a meal. ) 180 tablet 0  . dextromethorphan-guaiFENesin (MUCINEX DM) 30-600 MG 12hr tablet Take 1 tablet by mouth every evening.     [name] ENTRESTO 49-51 MG TAKE 1 TABLET TWICE A DAY (Patient taking differently: Take 1 tablet by mouth 2 (two) times daily. ) 180 tablet 4  . Ferrous Sulfate 27 MG TABS Take 27 mg by mouth every Monday, Wednesday, and Friday.    . gabapentin (NEURONTIN) 300 MG capsule  Take 300 mg by mouth 2 (two) times daily.     . Multiple Vitamins-Minerals (CENTRUM SILVER PO) Take 1 tablet by mouth daily.     [name] omega-3 acid ethyl esters (LOVAZA) 1 g capsule Take 1 g by mouth 2 (two) times daily.    . pantoprazole (PROTONIX) 40 MG tablet TAKE 1 TABLET DAILY (Patient taking differently: Take 40 mg by mouth daily. ) 90 tablet 4  . Potassium 99 MG TABS Take 99 mg by mouth every Monday, Wednesday, and Friday.     . SUPER B COMPLEX/C PO Take 1 tablet by mouth at bedtime.     . tamsulosin (FLOMAX) 0.4 MG CAPS capsule Take 0.4 mg by mouth 2 (two) times daily.     . Tetrahydrozoline HCl (VISINE OP) Place 1 drop into both eyes daily as needed (dry eyes).    . traMADol (ULTRAM) 50 MG tablet Take 50 mg by mouth every 6 (six) hours as needed.     No current facility-administered medications for this visit.    REVIEW OF SYSTEMS (negative unless checked):   Cardiac:  []  Chest pain or chest pressure? []  Shortness of breath upon activity? []  Shortness of breath when lying flat? []  Irregular heart rhythm?  Vascular:  []  Pain in calf, thigh, or hip brought on by walking? []  Pain in feet at night that wakes you up from your sleep? []  Blood clot in your  veins? []  Leg swelling?  Pulmonary:  []  Oxygen at home? []  Productive cough? []  Wheezing?  Neurologic:  []  Sudden weakness in arms or legs? []  Sudden numbness in arms or legs? []  Sudden onset of difficult speaking or slurred speech? []  Temporary loss of vision in one eye? []  Problems with dizziness?  Gastrointestinal:  []  Blood in stool? []  Vomited blood?  Genitourinary:  []  Burning when urinating? []  Blood in urine?  Psychiatric:  []  Major depression  Hematologic:  []  Bleeding problems? []  Problems with blood clotting?  Dermatologic:  []  Rashes or ulcers?  Constitutional:  []  Fever or chills?  Ear/Nose/Throat:  []  Change in hearing? []  Nose bleeds? []  Sore throat?  Musculoskeletal:  []  Back pain? []   Joint pain? []  Muscle pain?   Physical Examination   Vitals:   03/03/20 1042  BP: (!) 159/72  Pulse: 60  Resp: 16  Temp: 98.2 F (36.8 C)  SpO2: 98%  Weight: 149 lb (67.6 kg)  Height: 5\' 3"  (1.6 m)   Body mass index is 26.39 kg/m.  General:  WDWN in NAD; vital signs documented above Gait: Not observed HENT: WNL, normocephalic Pulmonary: normal non-labored breathing , without Rales, rhonchi,  wheezing Cardiac: regular HR Abdomen: soft, NT, no masses Skin: without rashes Vascular Exam/Pulses:  Right Left  Radial 2+ (normal) 2+ (normal)  DP absent 1+  PT absent absent   Extremities: without ischemic changes, without Gangrene , without cellulitis; without open wounds;  Musculoskeletal: no muscle wasting or atrophy  Neurologic: A&O X 3;  No focal weakness or paresthesias are detected Psychiatric:  The pt has Normal affect.  Non-Invasive Vascular imaging   ABI  ABI/TBIToday's ABIToday's TBIPrevious ABIPrevious TBI  +-------+-----------+-----------+------------+------------+  Right 0.57    0.49    0.79    0.58      +-------+-----------+-----------+------------+------------+  Left  0.88    0.74    0.97    0.91   BLE arterial duplex Left lower extremity bypass inflow stenosis stable 359 cm/s Left lower extremity bypass patent  Monophasic waveform throughout right lower extremity with common femoral artery 50 to 74% stenosis and proximal SFA 50 to 74% stenosis   Medical Decision Making   Roy [name] is a 74 y.o. male who presents with lifestyle limiting claudication of right lower extremity   Inflow stenosis of left lower extremity bypass stable and patient has a palpable left DP pulse; no indication for revascularization at this time  Right lower extremity arterial duplex unable to reproduce SFA stenosis however there is a drop in ABI and worsening claudication symptoms  Plan will be for aortogram with right lower  extremity runoff and possible intervention with Dr. [name]  Risks and benefits of angiography were discussed with the patient and he agrees to proceed  Patient may continue his aspirin Brilinta perioperatively   Dagoberto Ligas PA-C [facility] Office: [phone]  Clinic MD: Scot Dock

## 2020-03-03 NOTE — Progress Notes (Signed)
Established Intermittent Claudication   History of Present Illness   Roy [name] is a 74 y.o. ([birth year]) male who presents to go over BLE arterial studies.  He has history of bilateral iliac stenting in 2015 which was done for lifestyle limiting claudication.  He also underwent left profunda femoral endarterectomy and femoral to above-the-knee popliteal bypass with vein on 08/16/2017.  He also underwent atherectomy and balloon angioplasty of right SFA as well as angioplasty of right external iliac artery on 04/22/2018.  He returns to clinic today complaining of lifestyle limiting claudication of right lower extremity especially when walking uphill or climbing ladders.  He denies any rest pain or nonhealing wounds of bilateral lower extremities.  He continues to smoke and has no interest in quitting.  He is taking aspirin and Brilinta.  He is also on a statin for hypercholesterolemia.  The patient's PMH, PSH, SH, and FamHx were reviewed and are unchanged from prior visit.  Current Outpatient Medications  Medication Sig Dispense Refill  . acetaminophen (TYLENOL) 500 MG tablet Take 1,000 mg by mouth every 6 (six) hours as needed for mild pain or headache.     [name] aspirin EC 81 MG tablet Take 81 mg by mouth every evening.     [name] atorvastatin (LIPITOR) 20 MG tablet Take 20 mg by mouth daily.     [name] BRILINTA 90 MG TABS tablet Take 90 mg by mouth 2 (two) times daily.    . carvedilol (COREG) 3.125 MG tablet TAKE 1 TABLET TWICE A DAY (Patient taking differently: Take 3.125 mg by mouth 2 (two) times daily with a meal. ) 180 tablet 0  . dextromethorphan-guaiFENesin (MUCINEX DM) 30-600 MG 12hr tablet Take 1 tablet by mouth every evening.     [name] ENTRESTO 49-51 MG TAKE 1 TABLET TWICE A DAY (Patient taking differently: Take 1 tablet by mouth 2 (two) times daily. ) 180 tablet 4  . Ferrous Sulfate 27 MG TABS Take 27 mg by mouth every Monday, Wednesday, and Friday.    . gabapentin (NEURONTIN) 300 MG capsule  Take 300 mg by mouth 2 (two) times daily.     . Multiple Vitamins-Minerals (CENTRUM SILVER PO) Take 1 tablet by mouth daily.     [name] omega-3 acid ethyl esters (LOVAZA) 1 g capsule Take 1 g by mouth 2 (two) times daily.    . pantoprazole (PROTONIX) 40 MG tablet TAKE 1 TABLET DAILY (Patient taking differently: Take 40 mg by mouth daily. ) 90 tablet 4  . Potassium 99 MG TABS Take 99 mg by mouth every Monday, Wednesday, and Friday.     . SUPER B COMPLEX/C PO Take 1 tablet by mouth at bedtime.     . tamsulosin (FLOMAX) 0.4 MG CAPS capsule Take 0.4 mg by mouth 2 (two) times daily.     . Tetrahydrozoline HCl (VISINE OP) Place 1 drop into both eyes daily as needed (dry eyes).    . traMADol (ULTRAM) 50 MG tablet Take 50 mg by mouth every 6 (six) hours as needed.     No current facility-administered medications for this visit.    REVIEW OF SYSTEMS (negative unless checked):   Cardiac:  []  Chest pain or chest pressure? []  Shortness of breath upon activity? []  Shortness of breath when lying flat? []  Irregular heart rhythm?  Vascular:  []  Pain in calf, thigh, or hip brought on by walking? []  Pain in feet at night that wakes you up from your sleep? []  Blood clot in your  veins? []  Leg swelling?  Pulmonary:  []  Oxygen at home? []  Productive cough? []  Wheezing?  Neurologic:  []  Sudden weakness in arms or legs? []  Sudden numbness in arms or legs? []  Sudden onset of difficult speaking or slurred speech? []  Temporary loss of vision in one eye? []  Problems with dizziness?  Gastrointestinal:  []  Blood in stool? []  Vomited blood?  Genitourinary:  []  Burning when urinating? []  Blood in urine?  Psychiatric:  []  Major depression  Hematologic:  []  Bleeding problems? []  Problems with blood clotting?  Dermatologic:  []  Rashes or ulcers?  Constitutional:  []  Fever or chills?  Ear/Nose/Throat:  []  Change in hearing? []  Nose bleeds? []  Sore throat?  Musculoskeletal:  []  Back pain? []   Joint pain? []  Muscle pain?   Physical Examination   Vitals:   03/03/20 1042  BP: (!) 159/72  Pulse: 60  Resp: 16  Temp: 98.2 F (36.8 C)  SpO2: 98%  Weight: 149 lb (67.6 kg)  Height: 5\' 3"  (1.6 m)   Body mass index is 26.39 kg/m.  General:  WDWN in NAD; vital signs documented above Gait: Not observed HENT: WNL, normocephalic Pulmonary: normal non-labored breathing , without Rales, rhonchi,  wheezing Cardiac: regular HR Abdomen: soft, NT, no masses Skin: without rashes Vascular Exam/Pulses:  Right Left  Radial 2+ (normal) 2+ (normal)  DP absent 1+  PT absent absent   Extremities: without ischemic changes, without Gangrene , without cellulitis; without open wounds;  Musculoskeletal: no muscle wasting or atrophy  Neurologic: A&O X 3;  No focal weakness or paresthesias are detected Psychiatric:  The pt has Normal affect.  Non-Invasive Vascular imaging   ABI  ABI/TBIToday's ABIToday's TBIPrevious ABIPrevious TBI  +-------+-----------+-----------+------------+------------+  Right 0.57    0.49    0.79    0.58      +-------+-----------+-----------+------------+------------+  Left  0.88    0.74    0.97    0.91   BLE arterial duplex Left lower extremity bypass inflow stenosis stable 359 cm/s Left lower extremity bypass patent  Monophasic waveform throughout right lower extremity with common femoral artery 50 to 74% stenosis and proximal SFA 50 to 74% stenosis   Medical Decision Making   Roy [name] is a 74 y.o. male who presents with lifestyle limiting claudication of right lower extremity   Inflow stenosis of left lower extremity bypass stable and patient has a palpable left DP pulse; no indication for revascularization at this time  Right lower extremity arterial duplex unable to reproduce SFA stenosis however there is a drop in ABI and worsening claudication symptoms  Plan will be for aortogram with right lower  extremity runoff and possible intervention with Dr. [name]  Risks and benefits of angiography were discussed with the patient and he agrees to proceed  Patient may continue his aspirin Brilinta perioperatively   Dagoberto Ligas PA-C [facility] [place] Office: [phone]  Clinic MD: Scot Dock

## 2020-03-07 ENCOUNTER — Other Ambulatory Visit (HOSPITAL_COMMUNITY)
Admission: RE | Admit: 2020-03-07 | Discharge: 2020-03-07 | Disposition: A | Discharge status: Home / Self Care | Payer: Medicare Other | Source: Ambulatory Visit | Attending: Surgery | Admitting: Surgery

## 2020-03-07 ENCOUNTER — Other Ambulatory Visit: Payer: Self-pay

## 2020-03-07 DIAGNOSIS — Z20822 Contact with and (suspected) exposure to covid-19: Secondary | ICD-10-CM | POA: Insufficient documentation

## 2020-03-07 DIAGNOSIS — Z01812 Encounter for preprocedural laboratory examination: Secondary | ICD-10-CM | POA: Diagnosis not present

## 2020-03-07 LAB — SARS CORONAVIRUS 2 (TAT 6-24 HRS): SARS Coronavirus 2: NEGATIVE

## 2020-03-08 ENCOUNTER — Ambulatory Visit (HOSPITAL_COMMUNITY)
Admission: RE | Admit: 2020-03-08 | Discharge: 2020-03-08 | Disposition: A | Discharge status: Home / Self Care | Payer: Medicare Other | Attending: Surgery | Admitting: Surgery

## 2020-03-08 ENCOUNTER — Encounter (HOSPITAL_COMMUNITY)
Admission: RE | Disposition: A | Discharge status: Home / Self Care | Payer: Self-pay | Source: Home / Self Care | Attending: Surgery

## 2020-03-08 ENCOUNTER — Other Ambulatory Visit: Payer: Self-pay

## 2020-03-08 DIAGNOSIS — I739 Peripheral vascular disease, unspecified: Secondary | ICD-10-CM

## 2020-03-08 DIAGNOSIS — Z79899 Other long term (current) drug therapy: Secondary | ICD-10-CM | POA: Insufficient documentation

## 2020-03-08 DIAGNOSIS — Z7982 Long term (current) use of aspirin: Secondary | ICD-10-CM | POA: Diagnosis not present

## 2020-03-08 DIAGNOSIS — E7800 Pure hypercholesterolemia, unspecified: Secondary | ICD-10-CM | POA: Insufficient documentation

## 2020-03-08 DIAGNOSIS — I70211 Atherosclerosis of native arteries of extremities with intermittent claudication, right leg: Secondary | ICD-10-CM | POA: Diagnosis not present

## 2020-03-08 DIAGNOSIS — I771 Stricture of artery: Secondary | ICD-10-CM | POA: Insufficient documentation

## 2020-03-08 DIAGNOSIS — Z95828 Presence of other vascular implants and grafts: Secondary | ICD-10-CM | POA: Diagnosis not present

## 2020-03-08 HISTORY — PX: PERIPHERAL VASCULAR BALLOON ANGIOPLASTY: CATH118281

## 2020-03-08 HISTORY — PX: ABDOMINAL AORTOGRAM W/LOWER EXTREMITY: CATH118223

## 2020-03-08 HISTORY — PX: PERIPHERAL VASCULAR INTERVENTION: CATH118257

## 2020-03-08 LAB — POCT I-STAT, CHEM 8
BUN: 11 mg/dL (ref 8–23)
Calcium, Ion: 1.18 mmol/L (ref 1.15–1.40)
Chloride: 105 mmol/L (ref 98–111)
Creatinine, Ser: 0.6 mg/dL — ABNORMAL LOW (ref 0.61–1.24)
Glucose, Bld: 101 mg/dL — ABNORMAL HIGH (ref 70–99)
HCT: 39 % (ref 39.0–52.0)
Hemoglobin: 13.3 g/dL (ref 13.0–17.0)
Potassium: 4 mmol/L (ref 3.5–5.1)
Sodium: 136 mmol/L (ref 135–145)
TCO2: 26 mmol/L (ref 22–32)

## 2020-03-08 SURGERY — ABDOMINAL AORTOGRAM W/LOWER EXTREMITY
Anesthesia: LOCAL | Laterality: Right

## 2020-03-08 MED ORDER — HEPARIN SODIUM (PORCINE) 1000 UNIT/ML IJ SOLN
INTRAMUSCULAR | Status: DC | PRN
  Administered 2020-03-08: 7000 [IU] via INTRAVENOUS

## 2020-03-08 MED ORDER — MIDAZOLAM HCL 2 MG/2ML IJ SOLN
INTRAMUSCULAR | Status: AC
  Filled 2020-03-08: qty 2

## 2020-03-08 MED ORDER — ACETAMINOPHEN 325 MG PO TABS: 650.0000 mg | ORAL_TABLET | ORAL | Status: DC | PRN

## 2020-03-08 MED ORDER — SODIUM CHLORIDE 0.9 % WEIGHT BASED INFUSION: 1.0000 mL/kg/h | INTRAVENOUS | Status: DC

## 2020-03-08 MED ORDER — ONDANSETRON HCL 4 MG/2ML IJ SOLN: 4.0000 mg | Freq: Four times a day (QID) | INTRAMUSCULAR | Status: DC | PRN

## 2020-03-08 MED ORDER — SODIUM CHLORIDE 0.9 % IV SOLN: INTRAVENOUS | Status: DC

## 2020-03-08 MED ORDER — SODIUM CHLORIDE 0.9% FLUSH: 3.0000 mL | Freq: Two times a day (BID) | INTRAVENOUS | Status: DC

## 2020-03-08 MED ORDER — MIDAZOLAM HCL 2 MG/2ML IJ SOLN
INTRAMUSCULAR | Status: DC | PRN
  Administered 2020-03-08: 2 mg via INTRAVENOUS
  Administered 2020-03-08 (×2): 1 mg via INTRAVENOUS

## 2020-03-08 MED ORDER — IODIXANOL 320 MG/ML IV SOLN
INTRAVENOUS | Status: DC | PRN
  Administered 2020-03-08: 125 mL via INTRA_ARTERIAL

## 2020-03-08 MED ORDER — SODIUM CHLORIDE 0.9% FLUSH: 3.0000 mL | INTRAVENOUS | Status: DC | PRN

## 2020-03-08 MED ORDER — ASPIRIN EC 81 MG PO TBEC: 81.0000 mg | DELAYED_RELEASE_TABLET | Freq: Every day | ORAL | Status: DC

## 2020-03-08 MED ORDER — HYDRALAZINE HCL 20 MG/ML IJ SOLN: 5.0000 mg | INTRAMUSCULAR | Status: DC | PRN

## 2020-03-08 MED ORDER — HEPARIN (PORCINE) IN NACL 1000-0.9 UT/500ML-% IV SOLN
INTRAVENOUS | Status: AC
  Filled 2020-03-08: qty 1000

## 2020-03-08 MED ORDER — HEPARIN (PORCINE) IN NACL 1000-0.9 UT/500ML-% IV SOLN
INTRAVENOUS | Status: DC | PRN
  Administered 2020-03-08 (×2): 500 mL

## 2020-03-08 MED ORDER — FENTANYL CITRATE (PF) 100 MCG/2ML IJ SOLN
INTRAMUSCULAR | Status: DC | PRN
  Administered 2020-03-08: 25 ug via INTRAVENOUS
  Administered 2020-03-08: 50 ug via INTRAVENOUS
  Administered 2020-03-08: 25 ug via INTRAVENOUS

## 2020-03-08 MED ORDER — SODIUM CHLORIDE 0.9 % IV SOLN: 250.0000 mL | INTRAVENOUS | Status: DC | PRN

## 2020-03-08 MED ORDER — LABETALOL HCL 5 MG/ML IV SOLN: 10.0000 mg | INTRAVENOUS | Status: DC | PRN

## 2020-03-08 MED ORDER — LIDOCAINE HCL (PF) 1 % IJ SOLN
INTRAMUSCULAR | Status: DC | PRN
  Administered 2020-03-08: 15 mL

## 2020-03-08 MED ORDER — LIDOCAINE HCL (PF) 1 % IJ SOLN
INTRAMUSCULAR | Status: AC
  Filled 2020-03-08: qty 30

## 2020-03-08 MED ORDER — FENTANYL CITRATE (PF) 100 MCG/2ML IJ SOLN
INTRAMUSCULAR | Status: AC
  Filled 2020-03-08: qty 2

## 2020-03-08 MED ORDER — ASPIRIN 81 MG PO CHEW
CHEWABLE_TABLET | ORAL | Status: AC
  Administered 2020-03-08: 81 mg
  Filled 2020-03-08: qty 1

## 2020-03-08 SURGICAL SUPPLY — 26 items
BALLN IN.PACT DCB 5X40 (BALLOONS) ×4
BALLN MUSTANG 6X200X135 (BALLOONS) ×4
BALLOON MUSTANG 6X200X135 (BALLOONS) IMPLANT
CATH OMNI FLUSH 5F 65CM (CATHETERS) ×1 IMPLANT
CATH QUICKCROSS SUPP .035X90CM (MICROCATHETER) ×1 IMPLANT
CLOSURE MYNX CONTROL 6F/7F (Vascular Products) ×1 IMPLANT
DCB IN.PACT 5X40 (BALLOONS) IMPLANT
DEVICE TORQUE H2O (MISCELLANEOUS) ×1 IMPLANT
GLIDEWIRE NITREX 0.018X80X5 (WIRE) ×4
GUIDEWIRE ANGLED .035X150CM (WIRE) ×1 IMPLANT
GUIDEWIRE NITREX 0.018X80X5 (WIRE) IMPLANT
KIT ENCORE 26 ADVANTAGE (KITS) ×1 IMPLANT
KIT MICROPUNCTURE NIT STIFF (SHEATH) ×1 IMPLANT
KIT PV (KITS) ×4 IMPLANT
SHEATH PINNACLE 5F 10CM (SHEATH) ×1 IMPLANT
SHEATH PINNACLE 7F 10CM (SHEATH) ×1 IMPLANT
SHEATH PINNACLE MP 7F 45CM (SHEATH) ×1 IMPLANT
SHEATH PROBE COVER 6X72 (BAG) ×1 IMPLANT
STENT ELUVIA 7X120X130 (Permanent Stent) ×2 IMPLANT
SYR MEDRAD MARK V 150ML (SYRINGE) ×1 IMPLANT
TAPE VIPERTRACK RADIOPAQ (MISCELLANEOUS) IMPLANT
TAPE VIPERTRACK RADIOPAQUE (MISCELLANEOUS) ×4
TRANSDUCER W/STOPCOCK (MISCELLANEOUS) ×4 IMPLANT
TRAY PV CATH (CUSTOM PROCEDURE TRAY) ×4 IMPLANT
WIRE BENTSON .035X145CM (WIRE) ×1 IMPLANT
WIRE HI TORQ VERSACORE 300 (WIRE) ×1 IMPLANT

## 2020-03-08 NOTE — Interval H&P Note (Signed)
History and Physical Interval Note:  03/08/2020 12:54 PM  Roy [name]  has presented today for surgery, with the diagnosis of pad.  The various methods of treatment have been discussed with the patient and family. After consideration of risks, benefits and other options for treatment, the patient has consented to  Procedure(s): ABDOMINAL AORTOGRAM W/LOWER EXTREMITY ([name]) as a surgical intervention.  The patient's history has been reviewed, patient examined, no change in status, stable for surgery.  I have reviewed the patient's chart and labs.  Questions were answered to the patient's satisfaction.     Annamarie Major

## 2020-03-08 NOTE — Progress Notes (Signed)
Up and walked and tolerated well; client took his own IV out; checked catheter and it was intact; site unremarkable

## 2020-03-08 NOTE — Discharge Instructions (Signed)
Femoral Site Care This sheet gives you information about how to care for yourself after your procedure. Your health care provider may also give you more specific instructions. If you have problems or questions, contact your health care provider. What can I expect after the procedure? After the procedure, it is common to have:  Bruising that usually fades within 1-2 weeks.  Tenderness at the site. Follow these instructions at home: Wound care  Follow instructions from your health care provider about how to take care of your insertion site. Make sure you: ? Wash your hands with soap and water before you change your bandage (dressing). If soap and water are not available, use hand sanitizer. ? Change your dressing as told by your health care provider. ? Leave stitches (sutures), skin glue, or adhesive strips in place. These skin closures may need to stay in place for 2 weeks or longer. If adhesive strip edges start to loosen and curl up, you may trim the loose edges. Do not remove adhesive strips completely unless your health care provider tells you to do that.  Do not take baths, swim, or use a hot tub until your health care provider approves.  You may shower 24-48 hours after the procedure or as told by your health care provider. ? Gently wash the site with plain soap and water. ? Pat the area dry with a clean towel. ? Do not rub the site. This may cause bleeding.  Do not apply powder or lotion to the site. Keep the site clean and dry.  Check your femoral site every day for signs of infection. Check for: ? Redness, swelling, or pain. ? Fluid or blood. ? Warmth. ? Pus or a bad smell. Activity  For the first 2-3 days after your procedure, or as long as directed: ? Avoid climbing stairs as much as possible. ? Do not squat.  Do not lift anything that is heavier than 10 lb (4.5 kg), or the limit that you are told, until your health care provider says that it is safe.  Rest as  directed. ? Avoid sitting for a long time without moving. Get up to take short walks every 1-2 hours.  Do not drive for 24 hours if you were given a medicine to help you relax (sedative). General instructions  Take over-the-counter and prescription medicines only as told by your health care provider.  Keep all follow-up visits as told by your health care provider. This is important. Contact a health care provider if you have:  A fever or chills.  You have redness, swelling, or pain around your insertion site. Get help right away if:  The catheter insertion area swells very fast.  You pass out.  You suddenly start to sweat or your skin gets clammy.  The catheter insertion area is bleeding, and the bleeding does not stop when you hold steady pressure on the area.  The area near or just beyond the catheter insertion site becomes pale, cool, tingly, or numb. These symptoms may represent a serious problem that is an emergency. Do not wait to see if the symptoms will go away. Get medical help right away. Call your local emergency services (911 in the [place] Do not drive yourself to the hospital. Summary  After the procedure, it is common to have bruising that usually fades within 1-2 weeks.  Check your femoral site every day for signs of infection.  Do not lift anything that is heavier than 10 lb (4.5 kg), or the   limit that you are told, until your health care provider says that it is safe. This information is not intended to replace advice given to you by your health care provider. Make sure you discuss any questions you have with your health care provider. Document Revised: 11/25/2017 Document Reviewed: 11/25/2017 Elsevier Patient Education  2020 Elsevier Inc[employer]  

## 2020-03-08 NOTE — Op Note (Signed)
Patient name: Roy [name] MRN: [id number] DOB: 08/21/1946 Sex: male  03/08/2020 Pre-operative Diagnosis: Right leg claudication Post-operative diagnosis:  Same Surgeon:  Annamarie Major Procedure Performed:  1.  Ultrasound-guided access, left femoral artery  2.  Abdominal aortogram  3.  Bilateral lower extremity runoff  4.  Stent, right superficial femoral artery  5.  Drug-coated balloon angioplasty, left common femoral artery  6.  Conscious sedation, 77 minutes  7.  Closure device, Mynx   Indications: The patient has a history of left femoral-popliteal bypass graft.  He has a known stable inflow stenosis.  He presented with severe symptoms of claudication in the right leg and comes in today for further evaluation.  Procedure:  The patient was identified in the holding area and taken to room 8.  The patient was then placed supine on the table and prepped and draped in the usual sterile fashion.  A time out was called.  Conscious sedation was administered with the use of IV fentanyl and Versed under continuous physician and nurse monitoring.  Heart rate, blood pressure, and oxygen saturation were continuously monitored.  Total sedation time was 77 minutes.  Ultrasound was used to evaluate the left common femoral artery.  It was patent .  A digital ultrasound image was acquired.  A micropuncture needle was used to access the left common femoral artery under ultrasound guidance.  An 018 wire was advanced without resistance and a micropuncture sheath was placed.  The 018 wire was removed and a benson wire was placed.  The micropuncture sheath was exchanged for a 5 french sheath.  An omniflush catheter was advanced over the wire to the level of L-1.  An abdominal angiogram was obtained.  Next, using the omniflush catheter, placed at the aortic bifurcation, pelvic imaging and bilateral lower extremity runoff was performed. Findings:   Aortogram: No significant renal artery stenosis.  The  infrarenal abdominal aorta is calcified without significant stenosis.  There is a 50% stenosis at the origin of the right common iliac artery.  The stent within the right external iliac artery is patent throughout its course.  The left common iliac artery is calcified but without significant stenosis.  The left external iliac artery and stent are patent throughout their course.  Right Lower Extremity: The right common femoral artery is heavily calcified with approximate 50% stenosis.  The profunda origin is occluded.  The superficial femoral artery is patent with tandem greater than 80% stenoses.  The popliteal artery is patent throughout its course with three-vessel runoff.  Left Lower Extremity: The common femoral artery at the level of inguinal ligament shows approximately a 70% stenosis.  There is patulous dilatation of the left common femoral artery consistent with prior endarterectomy.  The profundofemoral arteries widely patent.  Bypass graft is visualized with anastomosis to the above-knee popliteal artery which is widely patent.  Peroneal artery is the dominant runoff.  Intervention: After the above images were acquired the decision made to proceed with intervention.  A 7 French 45 cm sheath was advanced into the right external iliac artery.  The patient was fully heparinized.  Using a 035 Glidewire and quick cross, the lesions within the right superficial femoral artery were successfully crossed.  A versa core wire was placed.  I elected to primarily stent this area.  Two 7 x 120 Elluvia overlapping stents were placed and molded with a 6 mm balloon.  Completion imaging showed resolution of the stenosis and better opacification of the profundofemoral  artery however it did appear occluded at its origin still.  At this point, I felt surgical correction of the common femoral lesion was the next step and so the right side was aborted.  The sheath was then withdrawn and further evaluation of the stenosis  within the left common femoral artery was obtained.  I elected to treat this with a 5 x 40 IMPACT drug-coated balloon.  This is done for 2 minutes.  There was a 70% stenosis which was down to less than 15% after treatment with no dissection.  A minx was used for closure.  Impression:  #1   Tandem near occlusive lesions in the right superficial femoral artery treated with primarily stenting using 7 x 120 overlapping Elluvia stents.  After treatment, the profunda was better visualized however still appeared to be occluded at its origin.  There is 50% minimum calcified stenosis within the right common femoral artery.  #2  70% common femoral artery stenosis on the left treated using a 5 x 40 IMPACT balloon  #3  The patient will be brought back to the office for discussions regarding right common femoral endarterectomy with patch angioplasty and simultaneous stenting of the proximal right common iliac artery 50% stenosis.    Theotis Burrow, M.D., [facility] [place] Office: [phone] Pager:  [phone]

## 2020-03-18 ENCOUNTER — Telehealth (HOSPITAL_COMMUNITY): Payer: Self-pay

## 2020-03-18 NOTE — Telephone Encounter (Signed)

## 2020-03-21 ENCOUNTER — Ambulatory Visit (INDEPENDENT_AMBULATORY_CARE_PROVIDER_SITE_OTHER): Payer: Medicare Other | Admitting: Surgery

## 2020-03-21 ENCOUNTER — Encounter: Payer: Self-pay | Admitting: Surgery

## 2020-03-21 ENCOUNTER — Other Ambulatory Visit: Payer: Self-pay

## 2020-03-21 VITALS — BP 149/74 | HR 62 | Temp 97.3°F | Resp 20 | Ht 63.0 in | Wt 147.0 lb

## 2020-03-21 DIAGNOSIS — I70213 Atherosclerosis of native arteries of extremities with intermittent claudication, bilateral legs: Secondary | ICD-10-CM

## 2020-03-21 DIAGNOSIS — I779 Disorder of arteries and arterioles, unspecified: Secondary | ICD-10-CM

## 2020-03-21 NOTE — Progress Notes (Signed)
[facility] [place]  Patient name: Roy [name] MRN: [id number] DOB: [birth year] Sex: male   REASON FOR VISIT:    Follow up  HISOTRY OF PRESENT ILLNESS:    This is a 74 year old gentleman who returns today for follow-up of his claudication symptoms. He is status post bilateral iliac stenting in 2015. This was done for lifestyle limiting claudication. An 8 x 40 self extending stent was placed on the left and 8 x 80 on the right. He developed progressive symptoms and on09/21/2018 he underwent a left femoral to above-knee popliteal artery bypass graft with ipsilateral non-reversed greater saphenous vein as well as the extensive profunda femoral endarterectomy. He did well initially however developed a fluid collection around the distal incision, and went back for I and D on 09/03/2017 with wound VAC placement. He began having significant right leg symptoms and on 04/22/2018 he underwent atherectomy and drug-coated balloon angioplasty of the right superficial femoral artery as well as angioplasty of the right external iliac artery.  The patient has a significant smoking history with no interest of stopping. He has a history of stroke as well as a intracranial carotid stent. He has a history of significant alcohol consumption with hospitalization for withdrawal. He is medically managed for hypertension. He is on a statin for hypercholesterolemia. He is on dual agent antiplatelet therapy with aspirin and Plavix.  He recently underwent angiography for recurrent symptoms.  Tandem near occlusive lesions were seen in the right superficial femoral artery and treated with primary stenting using 7 x 120 Elluvia stents.  He also had a 70% common femoral stenosis that was treated with a drug-coated balloon.  He is back today to discuss right common femoral endarterectomy and patch angioplasty with simultaneous stenting of his proximal right  common iliac artery stenosis which is approximately 50%.  He has had significant improvement in his right leg symptoms and right hip symptoms. PAST MEDICAL HISTORY:   Past Medical History:  Diagnosis Date  . AICD (automatic cardioverter/defibrillator) present 08/19/2015   [employer] BiV ICD for primary prevention by Dr. Lovena Le  . Alcohol abuse    6 beers per day; hospital admission in 2009 for withdrawal symptoms  . Alcoholic cirrhosis ([place])   . Anxiety and depression    denies   . Cerebrovascular disease 2009   TIA; 2009- right ICA stent; re-intervention for restenosis complicated by [facility] w/o sx  . CHF (congestive heart failure) ([place]) 06-02-14  . Chronic obstructive pulmonary disease ([place])   . Degenerative joint disease    Total shoulder arthroplasty-right  . Hyperlipidemia   . Hypertension   . LBBB (left bundle branch block)    Normal echo-2011; stress nuclear in 09/2010--septal hypoperfusion representing nontransmural infarction or the effect of left bundle branch block, no ischemia  . Myocardial infarction [facility]) June 02, 2014   Massive Heart Attack  . Peripheral vascular disease ([place])   . 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  . Thrombocytopenia ([place])   . Tobacco abuse    -100 pack years; 1.5 packs per day  . Traumatic seroma of thigh ([place])    left  . Tubular adenoma of colon      FAMILY HISTORY:   Family History  Problem Relation Age of Onset  . Coronary artery disease Mother   . Diabetes Mother   . Heart disease Mother        Before age 24 - 53 Bypasses  . Hypertension Mother   . Heart attack  Mother        3-4 Heart attacks  . Alzheimer's disease Father   . Diabetes Father   . Heart attack Sister   . Cancer Brother        Prostate  . Hyperlipidemia Son   . Prostate cancer Brother   . Alzheimer's disease Sister   . Colon cancer Neg Hx     SOCIAL HISTORY:   Social History   Tobacco Use  . Smoking status: Current Every Day Smoker    Packs/day: 1.00     Years: 60.00    Pack years: 60.00    Types: Cigarettes    Start date: 08/20/1957  . Smokeless tobacco: Never Used  Substance Use Topics  . Alcohol use: No    Alcohol/week: 0.0 standard drinks    Comment: quit in July 2015     ALLERGIES:   No Known Allergies   CURRENT MEDICATIONS:   Current Outpatient Medications  Medication Sig Dispense Refill  . acetaminophen (TYLENOL) 500 MG tablet Take 1,000 mg by mouth every 6 (six) hours as needed for mild pain or headache.     [name] aspirin EC 81 MG tablet Take 81 mg by mouth every evening.     [name] atorvastatin (LIPITOR) 20 MG tablet Take 20 mg by mouth daily.     [name] BRILINTA 90 MG TABS tablet Take 90 mg by mouth 2 (two) times daily.    . carvedilol (COREG) 3.125 MG tablet TAKE 1 TABLET TWICE A DAY (Patient taking differently: Take 3.125 mg by mouth 2 (two) times daily with a meal. ) 180 tablet 0  . dextromethorphan-guaiFENesin (MUCINEX DM) 30-600 MG 12hr tablet Take 1 tablet by mouth daily.     [name] ENTRESTO 49-51 MG TAKE 1 TABLET TWICE A DAY (Patient taking differently: Take 1 tablet by mouth 2 (two) times daily. ) 180 tablet 4  . gabapentin (NEURONTIN) 300 MG capsule Take 300 mg by mouth 2 (two) times daily.     . magnesium oxide (MAG-OX) 400 MG tablet Take 400 mg by mouth daily.    . Multiple Vitamins-Minerals (CENTRUM SILVER PO) Take 1 tablet by mouth daily.     [name] omega-3 acid ethyl esters (LOVAZA) 1 g capsule Take 1 g by mouth daily.     . pantoprazole (PROTONIX) 40 MG tablet TAKE 1 TABLET DAILY 90 tablet 4  . SUPER B COMPLEX/C PO Take 1 tablet by mouth at bedtime.     . tamsulosin (FLOMAX) 0.4 MG CAPS capsule Take 0.4 mg by mouth 2 (two) times daily.     . Tetrahydrozoline HCl (VISINE OP) Place 1 drop into both eyes daily as needed (dry eyes).    . traMADol (ULTRAM) 50 MG tablet Take 50 mg by mouth every 6 (six) hours as needed for severe pain.      No current facility-administered medications for this visit.    REVIEW OF SYSTEMS:    [X]  denotes positive finding, [ ]  denotes negative finding Cardiac  Comments:  Chest pain or chest pressure:    Shortness of breath upon exertion:    Short of breath when lying flat:    Irregular heart rhythm:        Vascular    Pain in calf, thigh, or hip brought on by ambulation: x   Pain in feet at night that wakes you up from your sleep:     Blood clot in your veins:    Leg swelling:  Pulmonary    Oxygen at home:    Productive cough:     Wheezing:         Neurologic    Sudden weakness in arms or legs:     Sudden numbness in arms or legs:     Sudden onset of difficulty speaking or slurred speech:    Temporary loss of vision in one eye:     Problems with dizziness:         Gastrointestinal    Blood in stool:     Vomited blood:         Genitourinary    Burning when urinating:     Blood in urine:        Psychiatric    Major depression:         Hematologic    Bleeding problems:    Problems with blood clotting too easily:        Skin    Rashes or ulcers:        Constitutional    Fever or chills:      PHYSICAL EXAM:   Vitals:   03/21/20 1202  BP: (!) 149/74  Pulse: 62  Resp: 20  Temp: (!) 97.3 F (36.3 C)  SpO2: 96%  Weight: 147 lb (66.7 kg)  Height: 5\' 3"  (1.6 m)    GENERAL: The patient is a well-nourished male, in no acute distress. The vital signs are documented above. CARDIAC: There is a regular rate and rhythm.  VASCULAR: Left groin cannulation site without complication PULMONARY: Non-labored respirations MUSCULOSKELETAL: There are no major deformities or cyanosis. NEUROLOGIC: No focal weakness or paresthesias are detected. SKIN: There are no ulcers or rashes noted. PSYCHIATRIC: The patient has a normal affect.  STUDIES:   None  MEDICAL ISSUES:   Right leg claudication: I discussed with the patient this he still has significant right common femoral plaque and an occluded ostium of the profundofemoral artery.  I think this needs  to be addressed so as to maintain patency within his recently placed superficial femoral artery stents.  I propose right common femoral endarterectomy with attempts to open up his profundofemoral artery.  Simultaneously I will perform retrograde stenting of his right common iliac artery.  This will be scheduled in the next several weeks.  He will need to be off of his Brilinta ahead of time.  I will also send him for cardiology clearance.    Leia Alf, MD, FACS [facility] [phone] Pager [phone]

## 2020-03-21 NOTE — H&P (View-Only) (Signed)
[facility] [place]  Patient name: Roy [name] MRN: [id number] DOB: 1946/04/23 Sex: male   REASON FOR VISIT:    Follow up  HISOTRY OF PRESENT ILLNESS:    This is a 74 year old gentleman who returns today for follow-up of his claudication symptoms. He is status post bilateral iliac stenting in 2015. This was done for lifestyle limiting claudication. An 8 x 40 self extending stent was placed on the left and 8 x 80 on the right. He developed progressive symptoms and on09/21/2018 he underwent a left femoral to above-knee popliteal artery bypass graft with ipsilateral non-reversed greater saphenous vein as well as the extensive profunda femoral endarterectomy. He did well initially however developed a fluid collection around the distal incision, and went back for I and D on 09/03/2017 with wound VAC placement. He began having significant right leg symptoms and on 04/22/2018 he underwent atherectomy and drug-coated balloon angioplasty of the right superficial femoral artery as well as angioplasty of the right external iliac artery.  The patient has a significant smoking history with no interest of stopping. He has a history of stroke as well as a intracranial carotid stent. He has a history of significant alcohol consumption with hospitalization for withdrawal. He is medically managed for hypertension. He is on a statin for hypercholesterolemia. He is on dual agent antiplatelet therapy with aspirin and Plavix.  He recently underwent angiography for recurrent symptoms.  Tandem near occlusive lesions were seen in the right superficial femoral artery and treated with primary stenting using 7 x 120 Elluvia stents.  He also had a 70% common femoral stenosis that was treated with a drug-coated balloon.  He is back today to discuss right common femoral endarterectomy and patch angioplasty with simultaneous stenting of his proximal right  common iliac artery stenosis which is approximately 50%.  He has had significant improvement in his right leg symptoms and right hip symptoms. PAST MEDICAL HISTORY:   Past Medical History:  Diagnosis Date  . AICD (automatic cardioverter/defibrillator) present 08/19/2015   [employer] Jude BiV ICD for primary prevention by Dr. Lovena Le  . Alcohol abuse    6 beers per day; hospital admission in 2009 for withdrawal symptoms  . Alcoholic cirrhosis ([place])   . Anxiety and depression    denies   . Cerebrovascular disease 2009   TIA; 2009- right ICA stent; re-intervention for restenosis complicated by [facility] w/o sx  . CHF (congestive heart failure) ([place]) 06-02-14  . Chronic obstructive pulmonary disease ([place])   . Degenerative joint disease    Total shoulder arthroplasty-right  . Hyperlipidemia   . Hypertension   . LBBB (left bundle branch block)    Normal echo-2011; stress nuclear in 09/2010--septal hypoperfusion representing nontransmural infarction or the effect of left bundle branch block, no ischemia  . Myocardial infarction [facility]) June 02, 2014   Massive Heart Attack  . Peripheral vascular disease ([place])   . 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  . Thrombocytopenia ([place])   . Tobacco abuse    -100 pack years; 1.5 packs per day  . Traumatic seroma of thigh ([place])    left  . Tubular adenoma of colon      FAMILY HISTORY:   Family History  Problem Relation Age of Onset  . Coronary artery disease Mother   . Diabetes Mother   . Heart disease Mother        Before age 4 - 11 Bypasses  . Hypertension Mother   . Heart attack  Mother        3-4 Heart attacks  . Alzheimer's disease Father   . Diabetes Father   . Heart attack Sister   . Cancer Brother        Prostate  . Hyperlipidemia Son   . Prostate cancer Brother   . Alzheimer's disease Sister   . Colon cancer Neg Hx     SOCIAL HISTORY:   Social History   Tobacco Use  . Smoking status: Current Every Day Smoker    Packs/day: 1.00     Years: 60.00    Pack years: 60.00    Types: Cigarettes    Start date: 08/20/1957  . Smokeless tobacco: Never Used  Substance Use Topics  . Alcohol use: No    Alcohol/week: 0.0 standard drinks    Comment: quit in July 2015     ALLERGIES:   No Known Allergies   CURRENT MEDICATIONS:   Current Outpatient Medications  Medication Sig Dispense Refill  . acetaminophen (TYLENOL) 500 MG tablet Take 1,000 mg by mouth every 6 (six) hours as needed for mild pain or headache.     [name] aspirin EC 81 MG tablet Take 81 mg by mouth every evening.     [name] atorvastatin (LIPITOR) 20 MG tablet Take 20 mg by mouth daily.     [name] BRILINTA 90 MG TABS tablet Take 90 mg by mouth 2 (two) times daily.    . carvedilol (COREG) 3.125 MG tablet TAKE 1 TABLET TWICE A DAY (Patient taking differently: Take 3.125 mg by mouth 2 (two) times daily with a meal. ) 180 tablet 0  . dextromethorphan-guaiFENesin (MUCINEX DM) 30-600 MG 12hr tablet Take 1 tablet by mouth daily.     [name] ENTRESTO 49-51 MG TAKE 1 TABLET TWICE A DAY (Patient taking differently: Take 1 tablet by mouth 2 (two) times daily. ) 180 tablet 4  . gabapentin (NEURONTIN) 300 MG capsule Take 300 mg by mouth 2 (two) times daily.     . magnesium oxide (MAG-OX) 400 MG tablet Take 400 mg by mouth daily.    . Multiple Vitamins-Minerals (CENTRUM SILVER PO) Take 1 tablet by mouth daily.     [name] omega-3 acid ethyl esters (LOVAZA) 1 g capsule Take 1 g by mouth daily.     . pantoprazole (PROTONIX) 40 MG tablet TAKE 1 TABLET DAILY 90 tablet 4  . SUPER B COMPLEX/C PO Take 1 tablet by mouth at bedtime.     . tamsulosin (FLOMAX) 0.4 MG CAPS capsule Take 0.4 mg by mouth 2 (two) times daily.     . Tetrahydrozoline HCl (VISINE OP) Place 1 drop into both eyes daily as needed (dry eyes).    . traMADol (ULTRAM) 50 MG tablet Take 50 mg by mouth every 6 (six) hours as needed for severe pain.      No current facility-administered medications for this visit.    REVIEW OF SYSTEMS:    [X]  denotes positive finding, [ ]  denotes negative finding Cardiac  Comments:  Chest pain or chest pressure:    Shortness of breath upon exertion:    Short of breath when lying flat:    Irregular heart rhythm:        Vascular    Pain in calf, thigh, or hip brought on by ambulation: x   Pain in feet at night that wakes you up from your sleep:     Blood clot in your veins:    Leg swelling:  Pulmonary    Oxygen at home:    Productive cough:     Wheezing:         Neurologic    Sudden weakness in arms or legs:     Sudden numbness in arms or legs:     Sudden onset of difficulty speaking or slurred speech:    Temporary loss of vision in one eye:     Problems with dizziness:         Gastrointestinal    Blood in stool:     Vomited blood:         Genitourinary    Burning when urinating:     Blood in urine:        Psychiatric    Major depression:         Hematologic    Bleeding problems:    Problems with blood clotting too easily:        Skin    Rashes or ulcers:        Constitutional    Fever or chills:      PHYSICAL EXAM:   Vitals:   03/21/20 1202  BP: (!) 149/74  Pulse: 62  Resp: 20  Temp: (!) 97.3 F (36.3 C)  SpO2: 96%  Weight: 147 lb (66.7 kg)  Height: 5\' 3"  (1.6 m)    GENERAL: The patient is a well-nourished male, in no acute distress. The vital signs are documented above. CARDIAC: There is a regular rate and rhythm.  VASCULAR: Left groin cannulation site without complication PULMONARY: Non-labored respirations MUSCULOSKELETAL: There are no major deformities or cyanosis. NEUROLOGIC: No focal weakness or paresthesias are detected. SKIN: There are no ulcers or rashes noted. PSYCHIATRIC: The patient has a normal affect.  STUDIES:   None  MEDICAL ISSUES:   Right leg claudication: I discussed with the patient this he still has significant right common femoral plaque and an occluded ostium of the profundofemoral artery.  I think this needs  to be addressed so as to maintain patency within his recently placed superficial femoral artery stents.  I propose right common femoral endarterectomy with attempts to open up his profundofemoral artery.  Simultaneously I will perform retrograde stenting of his right common iliac artery.  This will be scheduled in the next several weeks.  He will need to be off of his Brilinta ahead of time.  I will also send him for cardiology clearance.    Leia Alf, MD, FACS [facility] [phone] Pager [phone]

## 2020-03-22 ENCOUNTER — Encounter: Payer: Self-pay | Admitting: Family Medicine

## 2020-03-22 ENCOUNTER — Ambulatory Visit (INDEPENDENT_AMBULATORY_CARE_PROVIDER_SITE_OTHER): Payer: Medicare Other | Admitting: Family Medicine

## 2020-03-22 VITALS — BP 120/70 | HR 76 | Ht 63.0 in | Wt 149.8 lb

## 2020-03-22 DIAGNOSIS — I2510 Atherosclerotic heart disease of native coronary artery without angina pectoris: Secondary | ICD-10-CM

## 2020-03-22 DIAGNOSIS — I739 Peripheral vascular disease, unspecified: Secondary | ICD-10-CM

## 2020-03-22 DIAGNOSIS — E782 Mixed hyperlipidemia: Secondary | ICD-10-CM

## 2020-03-22 DIAGNOSIS — I10 Essential (primary) hypertension: Secondary | ICD-10-CM

## 2020-03-22 DIAGNOSIS — Z95810 Presence of automatic (implantable) cardiac defibrillator: Secondary | ICD-10-CM

## 2020-03-22 DIAGNOSIS — Z720 Tobacco use: Secondary | ICD-10-CM

## 2020-03-22 NOTE — Progress Notes (Signed)
Cardiology Office Note  Date: 03/22/2020   Roy [name], DOB May 11, 1946, MRN [id number]  PCP:  Celene Squibb, MD  Cardiologist:  Kate Sable, MD Electrophysiologist:  None   Chief Complaint: Follow-up CAD status post CABG, PAD, biventricular ICD, smoking, CHF, hypertension, hyperlipidemia, ischemic cardiomyopathy.  History of Present Illness: Roy [name] is a 74 y.o. male with a history of CAD status post CABG, PAD, biventricular ICD, smoking, CHF, hypertension, hyperlipidemia  Last encounter with Dr. Bronson Ing on 07/28/2019; he continued to smoke and had no desire to quit.  He denied any symptoms of chest pain, palpitations, shortness of breath, lightheadedness, dizziness, leg swelling, orthopnea, PND or syncope.  His LV systolic function had normalized demonstrated by an echocardiogram in September 2018.  In regards to PAD he remained on aspirin, atorvastatin and Brilinta.  He has had extensive revascularization in the past and continues to smoke and has no desire to quit.  His blood pressure was mildly elevated at last visit.  He continued on atorvastatin and attempts were being made to obtain a copy of lipids from PCP.  He follows with Dr. Lovena Le EP for his AICD.  Last visit was 12/23/2019  Recently had an intervention on his lower extremities by Dr. [name] on 03/08/2020 vascular and vein specialist.  He had tandem near occlusive lesions in the right superficial femoral artery treated with primary stenting using 7 x 120 overlapping Aluvea stents.  After treatment the profunda was better visualized however still appeared to be occluded at its origin.  There was 50% minimum calcified stenosis within the right common femoral artery.  70% common femoral artery stenosis on the left treated with a 5 x 40 impact balloon.  He mentioned the patient would be brought back to the office for discussions regarding right common femoral arterectomy with patch angioplasty and simultaneous  stenting of the proximal right common iliac artery 50%.   Patient presents today without any particular complaints.  He denies any recent acute illnesses, hospitalizations, surgeries, or travels.  No exposure to Covid virus.  Blood pressure was initially elevated on arrival.  Recheck was 120/70 in left arm.  He denies any progressive anginal or exertional symptoms, palpitations or arrhythmias, orthostatic symptoms, CVA or TIA-like symptoms, bleeding issues, or lower extremity edema.  He states he has a pending right femoral endarterectomy with right iliac stent schedule with Dr. [name] on Mar 31, 2020.   Past Medical History:  Diagnosis Date   AICD (automatic cardioverter/defibrillator) present 08/19/2015   St Jude BiV ICD for primary prevention by Dr. Lovena Le   Alcohol abuse    6 beers per day; hospital admission in 2009 for withdrawal symptoms   Alcoholic cirrhosis ([place])    Anxiety and depression    denies    Cerebrovascular disease 2009   TIA; 2009- right ICA stent; re-intervention for restenosis complicated by [facility] w/o sx   CHF (congestive heart failure) ([place]) 06-02-14   Chronic obstructive pulmonary disease ([place])    Degenerative joint disease    Total shoulder arthroplasty-right   Hyperlipidemia    Hypertension    LBBB (left bundle branch block)    Normal echo-2011; stress nuclear in 09/2010--septal hypoperfusion representing nontransmural infarction or the effect of left bundle branch block, no ischemia   Myocardial infarction [facility]) June 02, 2014   Massive Heart Attack   Peripheral vascular disease ([place])    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   Thrombocytopenia ([place])    Tobacco abuse    -  100 pack years; 1.5 packs per day   Traumatic seroma of thigh (HCC)    left   Tubular adenoma of colon     Past Surgical History:  Procedure Laterality Date   ABDOMINAL AORTAGRAM [name] 05/04/2014   Procedure: ABDOMINAL AORTAGRAM;  Surgeon: [name], MD;   Location: [facility];  Service: Cardiovascular;  Laterality: [name];   ABDOMINAL AORTOGRAM [name] 06/25/2017   Procedure: Abdominal Aortogram;  Surgeon: [name], MD;  Location: [place] CV LAB;  Service: Cardiovascular;  Laterality: [name];   ABDOMINAL AORTOGRAM W/LOWER EXTREMITY [name] 04/22/2018   Procedure: ABDOMINAL AORTOGRAM W/LOWER EXTREMITY;  Surgeon: [name], MD;  Location: [place] CV LAB;  Service: Cardiovascular;  Laterality: [name];   ABDOMINAL AORTOGRAM W/LOWER EXTREMITY [name] 03/08/2020   Procedure: ABDOMINAL AORTOGRAM W/LOWER EXTREMITY;  Surgeon: [name], MD;  Location: [place] CV LAB;  Service: Cardiovascular;  Laterality: [name];   AGILE CAPSULE [name] 03/18/2014   Procedure: AGILE CAPSULE;  Surgeon: Danie Binder, MD;  [facility];  Service: Endoscopy;  Laterality: [name];  [id number]   APPLICATION OF WOUND VAC Left 09/03/2017   Procedure: APPLICATION OF WOUND VAC;  Surgeon: Conrad Brooklet, MD;  Location: [place];  Service: Vascular;  Laterality: Left;   BACTERIAL OVERGROWTH TEST [name] 05/24/2015   Procedure: BACTERIAL OVERGROWTH TEST;  Surgeon: Danie Binder, MD;  [facility];  Service: Endoscopy;  Laterality: [name];  0700   BI-VENTRICULAR IMPLANTABLE CARDIOVERTER DEFIBRILLATOR  (CRT-D)  08/19/2015   CARPAL TUNNEL RELEASE Left 02/02/2016   Procedure: LEFT CARPAL TUNNEL RELEASE;  Surgeon: Daryll Brod, MD;  Location: [place];  Service: Orthopedics;  Laterality: Left;  ANESTHESIA: IV REGIONAL UPPER ARM   CATARACT EXTRACTION W/PHACO Right 03/08/2015   Procedure: CATARACT EXTRACTION PHACO AND INTRAOCULAR LENS PLACEMENT (IOC);  Surgeon: Rutherford Guys, MD;  Location: [facility];  Service: Ophthalmology;  Laterality: Right;  CDE:9.46   CATARACT EXTRACTION W/PHACO Left 03/22/2015   Procedure: CATARACT EXTRACTION PHACO AND INTRAOCULAR LENS PLACEMENT (IOC);  Surgeon: Rutherford Guys, MD;  Location: [facility];  Service: Ophthalmology;  Laterality: Left;   CDE:5.80   COLONOSCOPY  08/22/09   Fields-(Tubular Adenoma)3-mm transverse polyp/4-mm polyp otherwise noraml/small internal hemorrhoids   COLONOSCOPY [name] 04/14/2014   [id number] internal hemorrhids/normal mocsa in the terminal iluem/left colonis redundant   COLONOSCOPY W/ POLYPECTOMY  2011   ENDARTERECTOMY FEMORAL Left 08/16/2017   Procedure: ENDARTERECTOMY LEFT PROFUNDA FEMORAL;  Surgeon: [name], MD;  Location: [place];  Service: Vascular;  Laterality: Left;   EP IMPLANTABLE DEVICE [name] 08/19/2015   Procedure: BiV ICD Insertion CRT-D;  Surgeon: Evans Lance, MD;  Location: [place] CV LAB;  Service: Cardiovascular;  Laterality: [name];   ESOPHAGOGASTRODUODENOSCOPY [name] 03/05/2014   SLF: 1. Stricture at the gastroesophagael junction 2. Small hiatal hernia 3. Moderate non-erosive gastritis and duodentitis. 4. No surce for Melena identified.    FEMORAL-POPLITEAL BYPASS GRAFT Left 08/16/2017   Procedure: LEFT  FEMORAL-POPLITEAL ARTERY  BYPASS GRAFT;  Surgeon: [name], MD;  Location: [place];  Service: Vascular;  Laterality: Left;   GIVENS CAPSULE STUDY [name] 03/30/2014   Procedure: GIVENS CAPSULE STUDY;  Surgeon: Danie Binder, MD;  [facility];  Service: Endoscopy;  Laterality: [name];  7:30   I & D EXTREMITY Left 09/03/2017   Procedure: IRRIGATION AND DEBRIDEMENT EXTREMITY LEFT THIGH SEROMA;  Surgeon: Conrad Geneva, MD;  Location: [place];  Service: Vascular;  Laterality:  Left;   IR ANGIO INTRA EXTRACRAN SEL COM CAROTID INNOMINATE BILAT MOD SED  07/16/2017   IR ANGIO INTRA EXTRACRAN SEL COM CAROTID INNOMINATE BILAT MOD SED  06/12/2019   IR ANGIO VERTEBRAL SEL SUBCLAVIAN INNOMINATE BILAT MOD SED  07/16/2017   IR ANGIO VERTEBRAL SEL SUBCLAVIAN INNOMINATE BILAT MOD SED  06/12/2019   IR RADIOLOGIST EVAL & MGMT  04/01/2017   IR RADIOLOGIST EVAL & MGMT  08/02/2017   IR TRANSCATH EXCRAN VERT OR CAR A STENT  07/22/2019   IR US GUIDE VASC ACCESS RIGHT  06/12/2019   LOWER EXTREMITY  ANGIOGRAPHY Bilateral 06/25/2017   Procedure: Lower Extremity Angiography;  Surgeon: [name], MD;  Location: [place] CV LAB;  Service: Cardiovascular;  Laterality: Bilateral;   LUMBAR FUSION  2010   PERIPHERAL VASCULAR ATHERECTOMY Right 04/22/2018   Procedure: PERIPHERAL VASCULAR ATHERECTOMY;  Surgeon: [name], MD;  Location: [place] CV LAB;  Service: Cardiovascular;  Laterality: Right;  right superficial femoral   PERIPHERAL VASCULAR BALLOON ANGIOPLASTY Right 04/22/2018   Procedure: PERIPHERAL VASCULAR BALLOON ANGIOPLASTY;  Surgeon: [name], MD;  Location: [place] CV LAB;  Service: Cardiovascular;  Laterality: Right;  external iliac   PERIPHERAL VASCULAR BALLOON ANGIOPLASTY Left 03/08/2020   Procedure: PERIPHERAL VASCULAR BALLOON ANGIOPLASTY;  Surgeon: [name], MD;  Location: [place] CV LAB;  Service: Cardiovascular;  Laterality: Left;  Common Femoral   PERIPHERAL VASCULAR INTERVENTION Right 03/08/2020   Procedure: PERIPHERAL VASCULAR INTERVENTION;  Surgeon: [name], MD;  Location: [place] CV LAB;  Service: Cardiovascular;  Laterality: Right;  SFA   RADIOLOGY WITH ANESTHESIA [name] 07/01/2019   Procedure: STENTING;  Surgeon: Luanne Bras, MD;  Location: [place];  Service: Radiology;  Laterality: [name];   RADIOLOGY WITH ANESTHESIA [name] 07/22/2019   Procedure: STENTING;  Surgeon: Luanne Bras, MD;  Location: [place];  Service: Radiology;  Laterality: [name];   TOTAL SHOULDER ARTHROPLASTY Right 2011   Dr. Rafael Bihari NERVE TRANSPOSITION Left 02/02/2016   Procedure: LEFT IN-SITU DECOMPRESSION ULNAR NERVE ;  Surgeon: Daryll Brod, MD;  Location: [place];  Service: Orthopedics;  Laterality: Left;   ULNAR TUNNEL RELEASE Left 02/02/2016   Procedure: LEFT CUBITAL TUNNEL RELEASE;  Surgeon: Daryll Brod, MD;  Location: [place];  Service: Orthopedics;  Laterality: Left;   VASECTOMY  1971   VEIN HARVEST  Left 08/16/2017   Procedure: USING NON REVERSE LEFT GREATER SAPHENOUS VEIN HARVEST;  Surgeon: [name], MD;  [facility];  Service: Vascular;  Laterality: Left;    Current Outpatient Medications  Medication Sig Dispense Refill   acetaminophen (TYLENOL) 500 MG tablet Take 1,000 mg by mouth 2 (two) times daily.      aspirin EC 81 MG tablet Take 81 mg by mouth every evening.      atorvastatin (LIPITOR) 20 MG tablet Take 20 mg by mouth daily.      BRILINTA 90 MG TABS tablet Take 90 mg by mouth 2 (two) times daily.     carvedilol (COREG) 3.125 MG tablet TAKE 1 TABLET TWICE A DAY (Patient taking differently: Take 3.125 mg by mouth 2 (two) times daily with a meal. ) 180 tablet 0   dextromethorphan-guaiFENesin (MUCINEX DM) 30-600 MG 12hr tablet Take 1 tablet by mouth daily.      ENTRESTO 49-51 MG TAKE 1 TABLET TWICE A DAY (Patient taking differently: Take 1 tablet by mouth 2 (two) times daily. ) 180 tablet 4  gabapentin (NEURONTIN) 300 MG capsule Take 300 mg by mouth 2 (two) times daily.      magnesium oxide (MAG-OX) 400 MG tablet Take 400 mg by mouth daily.     Multiple Vitamins-Minerals (CENTRUM SILVER PO) Take 1 tablet by mouth daily. 50+     omega-3 acid ethyl esters (LOVAZA) 1 g capsule Take 1 g by mouth daily.      SUPER B COMPLEX/C PO Take 1 tablet by mouth at bedtime.      tamsulosin (FLOMAX) 0.4 MG CAPS capsule Take 0.4 mg by mouth 2 (two) times daily.      traMADol (ULTRAM) 50 MG tablet Take 50 mg by mouth every 6 (six) hours as needed for severe pain.      No current facility-administered medications for this visit.   Allergies:  Patient has no known allergies.   Social History: The patient  reports that he has been smoking cigarettes. He started smoking about 62 years ago. He has a 60.00 pack-year smoking history. He has never used smokeless tobacco. He reports that he does not drink alcohol or use drugs.   Family History: The patient's family history includes  Alzheimer's disease in his father and sister; Cancer in his brother; Coronary artery disease in his mother; Diabetes in his father and mother; Heart attack in his mother and sister; Heart disease in his mother; Hyperlipidemia in his son; Hypertension in his mother; Prostate cancer in his brother.   ROS:  Please see the history of present illness. Otherwise, complete review of systems is positive for none.  All other systems are reviewed and negative.   Physical Exam: VS:  BP (!) 142/70    Pulse 76    Ht 5\' 3"  (1.6 m)    Wt 149 lb 12.5 oz (67.9 kg)    SpO2 98%    BMI 26.53 kg/m , BMI Body mass index is 26.53 kg/m.  Wt Readings from Last 3 Encounters:  03/22/20 149 lb 12.5 oz (67.9 kg)  03/21/20 147 lb (66.7 kg)  03/03/20 149 lb (67.6 kg)    General: Patient appears comfortable at rest. Neck: Supple, no elevated JVP or carotid bruits, no thyromegaly. Lungs: Clear to auscultation, nonlabored breathing at rest. Cardiac: Regular rate and rhythm, no S3 or significant systolic murmur, no pericardial rub. Extremities: No pitting edema, distal pulses 2+. Skin: Warm and dry. Musculoskeletal: No kyphosis. Neuropsychiatric: Alert and oriented x3, affect grossly appropriate.  ECG:  An ECG dated 03/22/2020 was personally reviewed today and demonstrated:  Electronic ventricular pacemaker with a rate of 68.  Recent Labwork: 11/10/2019: ALT 13; AST 18; Platelets 243 03/08/2020: BUN 11; Creatinine, Ser 0.60; Hemoglobin 13.3; Potassium 4.0; Sodium 136     Component Value Date/Time   CHOL 177 01/08/2012 0809   TRIG 77 06/08/2014 0331   HDL 53 10/18/2010 1927   CHOLHDL 1.8 10/18/2010 1927   VLDL 8 10/18/2010 1927   LDLCALC  10/18/2010 1927    35        Total Cholesterol/HDL:CHD Risk Coronary Heart Disease Risk Table                     Men   Women  1/2 Average Risk   3.4   3.3  Average Risk       5.0   4.4  2 X Average Risk   9.6   7.1  3 X Average Risk  23.4   11.0        Use  the calculated  Patient Ratio above and the CHD Risk Table to determine the patient's CHD Risk.        ATP III CLASSIFICATION (LDL):  <100     mg/dL   Optimal  100-129  mg/dL   Near or Above                    Optimal  130-159  mg/dL   Borderline  160-189  mg/dL   High  >190     mg/dL   Very High    Other Studies Reviewed Today:  03/08/2020 Peripheral vascular catheterization Pre-operative Diagnosis: Right leg claudication Post-operative diagnosis:  Same Surgeon:  Annamarie Major Procedure Performed:               1.  Ultrasound-guided access, left femoral artery               2.  Abdominal aortogram               3.  Bilateral lower extremity runoff               4.  Stent, right superficial femoral artery               5.  Drug-coated balloon angioplasty, left common femoral artery               6.  Conscious sedation, 77 minutes               7.  Closure device, Mynx   Indications: The patient has a history of left femoral-popliteal bypass graft.  He has a known stable inflow stenosis.  He presented with severe symptoms of claudication in the right leg and comes in today for further evaluation.  Procedure:  The patient was identified in the holding area and taken to room 8.  The patient was then placed supine on the table and prepped and draped in the usual sterile fashion.  A time out was called.  Conscious sedation was administered with the use of IV fentanyl and Versed under continuous physician and nurse monitoring.  Heart rate, blood pressure, and oxygen saturation were continuously monitored.  Total sedation time was 77 minutes.  Ultrasound was used to evaluate the left common femoral artery.  It was patent .  A digital ultrasound image was acquired.  A micropuncture needle was used to access the left common femoral artery under ultrasound guidance.  An 018 wire was advanced without resistance and a micropuncture sheath was placed.  The 018 wire was removed and a benson wire was placed.  The  micropuncture sheath was exchanged for a 5 french sheath.  An omniflush catheter was advanced over the wire to the level of L-1.  An abdominal angiogram was obtained.  Next, using the omniflush catheter, placed at the aortic bifurcation, pelvic imaging and bilateral lower extremity runoff was performed. Findings:                Aortogram: No significant renal artery stenosis.  The infrarenal abdominal aorta is calcified without significant stenosis.  There is a 50% stenosis at the origin of the right common iliac artery.  The stent within the right external iliac artery is patent throughout its course.  The left common iliac artery is calcified but without significant stenosis.  The left external iliac artery and stent are patent throughout their course.               Right Lower Extremity: The  right common femoral artery is heavily calcified with approximate 50% stenosis.  The profunda origin is occluded.  The superficial femoral artery is patent with tandem greater than 80% stenoses.  The popliteal artery is patent throughout its course with three-vessel runoff.               Left Lower Extremity: The common femoral artery at the level of inguinal ligament shows approximately a 70% stenosis.  There is patulous dilatation of the left common femoral artery consistent with prior endarterectomy.  The profundofemoral arteries widely patent.  Bypass graft is visualized with anastomosis to the above-knee popliteal artery which is widely patent.  Peroneal artery is the dominant runoff.  Intervention: After the above images were acquired the decision made to proceed with intervention.  A 7 French 45 cm sheath was advanced into the right external iliac artery.  The patient was fully heparinized.  Using a 035 Glidewire and quick cross, the lesions within the right superficial femoral artery were successfully crossed.  A versa core wire was placed.  I elected to primarily stent this area.  Two 7 x 120 Elluvia  overlapping stents were placed and molded with a 6 mm balloon.  Completion imaging showed resolution of the stenosis and better opacification of the profundofemoral artery however it did appear occluded at its origin still.  At this point, I felt surgical correction of the common femoral lesion was the next step and so the right side was aborted.  The sheath was then withdrawn and further evaluation of the stenosis within the left common femoral artery was obtained.  I elected to treat this with a 5 x 40 IMPACT drug-coated balloon.  This is done for 2 minutes.  There was a 70% stenosis which was down to less than 15% after treatment with no dissection.  A minx was used for closure.  Impression:               #1   Tandem near occlusive lesions in the right superficial femoral artery treated with primarily stenting using 7 x 120 overlapping Elluvia stents.  After treatment, the profunda was better visualized however still appeared to be occluded at its origin.  There is 50% minimum calcified stenosis within the right common femoral artery.               #2  70% common femoral artery stenosis on the left treated using a 5 x 40 IMPACT balloon               #3  The patient will be brought back to the office for discussions regarding right common femoral endarterectomy with patch angioplasty and simultaneous stenting of the proximal right common iliac artery 50% stenosis.  Vascular ultrasound ABI with or without TBI 03/03/2020 Right ABIs and TBIs appear essentially unchanged compared to prior study on 01/26/2019. Left ABIs appear decreased compared to prior study on 01/26/2019. Left TBIs appear essentially unchanged compared to prior exam on 01/26/2019. Summary: Right: Resting right ankle-brachial index indicates moderate right lower extremity arterial disease. The right toe-brachial index is abnormal. RT great toe pressure = 76 mmHg. Left: Resting left ankle-brachial index indicates mild left lower extremity  arterial disease. The left toebrachial index is normal. LT Great toe pressure = 114 mmHg.  Echocardiogram 08/01/2017 Study Conclusions   - Left ventricle: The cavity size was normal. Wall thickness was  normal. Systolic function was normal. The estimated ejection  fraction was in the range of 50% to  55%. Wall motion was normal;  there were no regional wall motion abnormalities. Doppler  parameters are consistent with abnormal left ventricular  relaxation (grade 1 diastolic dysfunction). Indeterminate filling  pressures.  - Aortic valve: Trileaflet; mildly thickened leaflets.  - Mitral valve: Calcified annulus. Normal thickness leaflets .  There was mild regurgitation.  - Atrial septum: No defect or patent foramen ovale was identified.   Impressions:   - When compared to the study dated [id number], LV systolic function  has since normalized.   Assessment and Plan:  1. CAD in native artery   2. Peripheral arterial disease ([place])   3. ICD (implantable cardioverter-defibrillator), biventricular, in situ   4. Tobacco abuse disorder   5. Essential hypertension   6. Mixed hyperlipidemia    1. CAD in native artery/ischemic cardiomyopathy Status post history of CABG.  With previous history of stenting.  Denies any progressive anginal or exertional symptoms.  Continue aspirin 81 mg daily, Brilinta 90 mg p.o. twice daily, Carvedilol 3.125 mg p.o. twice daily.  Continue Entresto 49/51 mg p.o. twice daily  2. Peripheral arterial disease ([place]) Patient recently had interventions performed on his right leg by Dr. [name].  He had near occlusive lesions in the right superficial femoral artery treated primarily with stent using a 7 x 120 mm overlapping balloon via stents.  After stent placement the area appeared to be occluded at its origin.  There was 50% minimum calcified stenosis within the right common femoral artery.  70% common femoral artery stenosis on the left treated using a 5 x  40 impact balloon.  He has a pending intervention on 03/31/2020 with scheduled right endarterectomy femoral with right iliac stent.  3. ICD (implantable cardioverter-defibrillator), biventricular, in situ Biventricular pacemaker defibrillator in place.  EKG today shows electronic ventricular pacemaker rhythm with a rate of 68.  Patient sees Dr. Lovena Le for management.  4. Tobacco abuse disorder Continues to smoke.  Not ready to quit.  5. Essential hypertension Blood pressure initially elevated on arrival at 142/70.  Recheck in left arm was 120/70.  Continue carvedilol 3.125 mg p.o. twice daily  6. Mixed hyperlipidemia Continue atorvastatin 20 mg p.o. daily.  Patient states he has a upcoming annual wellness visit with Dr. [place] Crane.  Asked the patient to have Dr. [place] Crane send [place] the results of the lab work to assess his current lipid status.  Medication Adjustments/Labs and Tests Ordered: Current medicines are reviewed at length with the patient today.  Concerns regarding medicines are outlined above.   Disposition: Follow-up with Dr. Bronson Ing or APP 6 months. Signed, Levell July, NP 03/22/2020 1:28 PM    [facility] at [place], [place], [place] [address] Phone: [phone]; Fax: [phone]

## 2020-03-22 NOTE — Patient Instructions (Addendum)

## 2020-03-28 ENCOUNTER — Encounter: Payer: Self-pay | Admitting: Internal Medicine

## 2020-03-28 NOTE — Progress Notes (Signed)
[facility] - [address]ia Buff, [address] [place] [address] Phone: [phone] Fax: [place], [address] [place] [address] Phone: [phone] Fax: [phone]  [place], [place] - [place] [address] [place] [address] [place] [address] Phone: [phone] Fax: [phone]      Your procedure is scheduled on Thursday, Mar 31, 2020.  Report to [facility] Main Entrance "A" at 05:30 A.M., and check in at the Admitting office.  Call this number if you have problems the morning of surgery:  [phone]  Call [phone] if you have any questions prior to your surgery date Monday-Friday 8am-4pm    Remember:  Do not eat or drink after midnight the night before your surgery     Take these medicines the morning of surgery with A SIP OF: Acetaminophen (Tylenol) Atorvastatin (Lipitor) Carvedilol (Coreg) Gabapentin (Neurontin) Tamsulosin (Flomax) Tramadol (Ultram) - if needed  As of today, STOP taking any Aspirin containing products, Aleve, Naproxen, Ibuprofen, Motrin, Advil, Goody's, BC's, all herbal medications, fish oil, and all vitamins.  Follow your surgeon's instructions on when to stop Aspirin and Brilinta.  If no instructions were given by your surgeon then you will need to call the office to get those instructions.                        Do not wear jewelry, make up, or nail polish            Do not wear lotions, powders, colognes, or deodorant.            Men may shave face and neck.            Do not bring valuables to the hospital.            [facility] is not responsible for any belongings or valuables.  Do NOT Smoke (Tobacco/Vapping) or drink Alcohol 24 hours prior to your procedure  If you use a CPAP at night, you may bring all equipment for your overnight stay.   Contacts, glasses, dentures or bridgework may not  be worn into surgery.      For patients admitted to the hospital, discharge time will be determined by your treatment team.   Patients discharged the day of surgery will not be allowed to drive home, and someone needs to stay with them for 24 hours.    Special instructions:   [place]- Preparing For Surgery  Before surgery, you can play an important role. Because skin is not sterile, your skin needs to be as free of germs as possible. You can reduce the number of germs on your skin by washing with CHG (chlorahexidine gluconate) Soap before surgery.  CHG is an antiseptic cleaner which kills germs and bonds with the skin to continue killing germs even after washing.    Oral Hygiene is also important to reduce your risk of infection.  Remember - BRUSH YOUR TEETH THE MORNING OF SURGERY WITH YOUR REGULAR TOOTHPASTE  Please do not use if you have an allergy to CHG or antibacterial soaps. If your skin becomes reddened/irritated stop using the CHG.  Do not shave (including legs and underarms) for at least 48 hours prior to first CHG shower. It is OK to shave your face.  Please follow these instructions carefully.   1. Shower the [employer]  BEFORE SURGERY and the MORNING OF SURGERY with CHG Soap.   2. If you chose to wash your hair, wash your hair first as usual with your normal shampoo.  3. After you shampoo, rinse your hair and body thoroughly to remove the shampoo.  4. Use CHG as you would any other liquid soap. You can apply CHG directly to the skin and wash gently with a scrungie or a clean washcloth.   5. Apply the CHG Soap to your body ONLY FROM THE NECK DOWN.  Do not use on open wounds or open sores. Avoid contact with your eyes, ears, mouth and genitals (private parts). Wash Face and genitals (private parts)  with your normal soap.   6. Wash thoroughly, paying special attention to the area where your surgery will be performed.  7. Thoroughly rinse your body with warm water from the neck  down.  8. DO NOT shower/wash with your normal soap after using and rinsing off the CHG Soap.  9. Pat yourself dry with a CLEAN TOWEL.  10. Wear CLEAN PAJAMAS to bed the night before surgery, wear comfortable clothes the morning of surgery  11. Place CLEAN SHEETS on your bed the night of your first shower and DO NOT SLEEP WITH PETS.   Day of Surgery:   Do not apply any deodorants/lotions.  Please wear clean clothes to the hospital/surgery center.   Remember to brush your teeth WITH YOUR REGULAR TOOTHPASTE.   Please read over the following fact sheets that you were given.

## 2020-03-28 NOTE — Progress Notes (Signed)
Spoke with [place], [employer] rep, that patient is having surgery on Thursday 03/31/20 at 0730.

## 2020-03-28 NOTE — Progress Notes (Signed)
PERIOPERATIVE PRESCRIPTION FOR IMPLANTED CARDIAC DEVICE PROGRAMMING  Patient Information: Name:  Roy [name]  DOB:  Jan 24, 1946  MRN:  [id number]    Planned Procedure: Right Femoral Endarterectomy with right iliac stent  Surgeon: Dr. [name]  Date of Procedure: Thursday 03/31/2020  Cautery will be used.  Position during surgery: Supine   Please send documentation back to:  [facility] (Fax # [phone])    Device Information:  Clinic EP Physician:  Cristopher Peru, MD   Device Type:  Defibrillator Manufacturer and Phone #:  [employer] Jude/[employer]ott: [phone] Pacemaker Dependent?:  Yes.   Date of Last Device Check:  01/08/2020 Normal Device Function?:  Yes.    Electrophysiologist's Recommendations:   Have magnet available.  Provide continuous ECG monitoring when magnet is used or reprogramming is to be performed.   Procedure should not interfere with device function.  No device programming or magnet placement needed.  Per Device Clinic Standing Orders, Simone Curia, RN  3:36 PM 03/28/2020

## 2020-03-29 ENCOUNTER — Other Ambulatory Visit: Payer: Self-pay

## 2020-03-29 ENCOUNTER — Encounter (HOSPITAL_COMMUNITY): Payer: Self-pay

## 2020-03-29 ENCOUNTER — Encounter (HOSPITAL_COMMUNITY)
Admission: RE | Admit: 2020-03-29 | Discharge: 2020-03-29 | Disposition: A | Discharge status: Home / Self Care | Payer: Medicare Other | Source: Ambulatory Visit | Attending: Surgery | Admitting: Surgery

## 2020-03-29 LAB — CBC
HCT: 41.6 % (ref 39.0–52.0)
Hemoglobin: 13.7 g/dL (ref 13.0–17.0)
MCH: 31.2 pg (ref 26.0–34.0)
MCHC: 32.9 g/dL (ref 30.0–36.0)
MCV: 94.8 fL (ref 80.0–100.0)
Platelets: 291 10*3/uL (ref 150–400)
RBC: 4.39 MIL/uL (ref 4.22–5.81)
RDW: 12.4 % (ref 11.5–15.5)
WBC: 6.1 10*3/uL (ref 4.0–10.5)
nRBC: 0 % (ref 0.0–0.2)

## 2020-03-29 LAB — COMPREHENSIVE METABOLIC PANEL
ALT: 16 U/L (ref 0–44)
AST: 19 U/L (ref 15–41)
Albumin: 3.9 g/dL (ref 3.5–5.0)
Alkaline Phosphatase: 77 U/L (ref 38–126)
Anion gap: 8 (ref 5–15)
BUN: 8 mg/dL (ref 8–23)
CO2: 26 mmol/L (ref 22–32)
Calcium: 9.1 mg/dL (ref 8.9–10.3)
Chloride: 98 mmol/L (ref 98–111)
Creatinine, Ser: 0.69 mg/dL (ref 0.61–1.24)
GFR calc Af Amer: >60 mL/min (ref >60)
GFR calc non Af Amer: >60 mL/min (ref >60)
Glucose, Bld: 118 mg/dL — ABNORMAL HIGH (ref 70–99)
Potassium: 4.3 mmol/L (ref 3.5–5.1)
Sodium: 132 mmol/L — ABNORMAL LOW (ref 135–145)
Total Bilirubin: 0.5 mg/dL (ref 0.3–1.2)
Total Protein: 6.8 g/dL (ref 6.5–8.1)

## 2020-03-29 LAB — PROTIME-INR
INR: 1.1 (ref 0.8–1.2)
Prothrombin Time: 13.4 seconds (ref 11.4–15.2)

## 2020-03-29 LAB — URINALYSIS, ROUTINE W REFLEX MICROSCOPIC
Bilirubin Urine: NEGATIVE
Glucose, UA: NEGATIVE mg/dL
Hgb urine dipstick: NEGATIVE
Ketones, ur: NEGATIVE mg/dL
Leukocytes,Ua: NEGATIVE
Nitrite: NEGATIVE
Protein, ur: NEGATIVE mg/dL
Specific Gravity, Urine: 1.005 (ref 1.005–1.030)
pH: 6 (ref 5.0–8.0)

## 2020-03-29 LAB — SURGICAL PCR SCREEN
MRSA, PCR: NEGATIVE
Staphylococcus aureus: NEGATIVE

## 2020-03-29 LAB — APTT: aPTT: 32 seconds (ref 24–36)

## 2020-03-29 NOTE — Progress Notes (Signed)
PCP - Dr. Allyn Kenner Cardiologist - Dr. Bronson Ing  PPM/ICD - [employer] Device Orders - Pending. Message has been sent. Rep Notified - Dionne Milo, [employer] rep has been notified of surgery date and time.   Chest x-ray - [name] EKG - 03/22/20 Stress Test - 08/30/14 ECHO - 08/01/17 Cardiac Cath -denies   Sleep Study - denies CPAP - denies  Blood Thinner Instructions:Brilinta-hold 5 days prior to procedure. LD reported to be either 4/28 or 4/29, pt can't remember exactly.  Aspirin Instructions:Contine ASA as instructed by surgeons office.   ERAS Protcol -[name] PRE-SURGERY Ensure or G2- [name]   COVID TEST- Scheduled to be completed tomorrow, 5/5 at [facility].    Anesthesia review: Yes, heart history and pending device orders.   Patient denies shortness of breath, fever, cough and chest pain at PAT appointment   All instructions explained to the patient, with a verbal understanding of the material. Patient agrees to go over the instructions while at home for a better understanding. Patient also instructed to self quarantine after being tested for COVID-19. The opportunity to ask questions was provided.    Coronavirus Screening  Have you experienced the following symptoms:  Cough yes/no: No Fever (>100.36F)  yes/no: No Runny nose yes/no: No Sore throat yes/no: No Difficulty breathing/shortness of breath  yes/no: No  Have you or a family member traveled in the last 14 days and where? yes/no: No   If the patient indicates "YES" to the above questions, their PAT will be rescheduled to limit the exposure to others and, the surgeon will be notified. THE PATIENT WILL NEED TO BE ASYMPTOMATIC FOR 14 DAYS.   If the patient is not experiencing any of these symptoms, the PAT nurse will instruct them to NOT bring anyone with them to their appointment since they may have these symptoms or traveled as well.   Please remind your patients and families that hospital visitation restrictions are in  effect and the importance of the restrictions.

## 2020-03-30 ENCOUNTER — Other Ambulatory Visit (HOSPITAL_COMMUNITY)
Admission: RE | Admit: 2020-03-30 | Discharge: 2020-03-30 | Disposition: A | Discharge status: Home / Self Care | Payer: Medicare Other | Source: Ambulatory Visit | Attending: Surgery | Admitting: Surgery

## 2020-03-30 NOTE — Anesthesia Preprocedure Evaluation (Addendum)
Anesthesia Evaluation  Patient identified by MRN, date of birth, ID band Patient awake    Reviewed: Allergy & Precautions, NPO status , Patient's Chart, lab work & pertinent test results, reviewed documented beta blocker date and time   History of Anesthesia Complications Negative for: history of anesthetic complications  Airway Mallampati: I  TM Distance: >3 FB Neck ROM: Full    Dental  (+) Edentulous Upper, Edentulous Lower   Pulmonary COPD, Current SmokerPatient did not abstain from smoking.,  03/30/2020 SARS coronavirus NEG   breath sounds clear to auscultation       Cardiovascular hypertension, Pt. on medications and Pt. on home beta blockers (-) angina+ CAD, + CABG and + Peripheral Vascular Disease  + pacemaker + Cardiac Defibrillator  Rhythm:Regular Rate:Normal  '18 ECHO: EF 50-55%, mild MR   Neuro/Psych H/o SAH TIA   GI/Hepatic negative GI ROS, (+) Cirrhosis     substance abuse (no ETOH since 2015)  alcohol use,   Endo/Other  Morbid obesity  Renal/GU negative Renal ROS     Musculoskeletal   Abdominal (+) + obese,   Peds  Hematology Brilinta   Anesthesia Other Findings   Reproductive/Obstetrics                           Anesthesia Physical Anesthesia Plan  ASA: III  Anesthesia Plan: General   Post-op Pain Management:    Induction: Intravenous  PONV Risk Score and Plan: 1 and Dexamethasone and Ondansetron  Airway Management Planned: Oral ETT  Additional Equipment:   Intra-op Plan:   Post-operative Plan: Extubation in OR  Informed Consent: I have reviewed the patients History and Physical, chart, labs and discussed the procedure including the risks, benefits and alternatives for the proposed anesthesia with the patient or authorized representative who has indicated his/her understanding and acceptance.       Plan Discussed with: CRNA and Surgeon  Anesthesia Plan  Comments: (Follows with cardiology for hx of CAD status post CABG with previous hx of stenting, PAD, [employer] Jude biventricular ICD, hypertension, hyperlipidemia, HFrEF and ischemic cardiomyopathy (EF normalized 50-55% by Echo 9/18). Last seen by general cardiology 03/22/20, doing well at that time, discussed upcoming right femoral endarterectomy and stenting. Last seen by EP cardiology 12/23/19, noted to have class 2 HF symptoms, not limited by dyspnea. Main issue was LE vascular claudication. ICD was interrogated with normal device function.   Hx of significant PAD followed by Dr. [name]. Hx of TIA with subsequent R ICA stent 2009. He is s/p bilateral iliac stenting in 2015. He developed progressive symptoms and on09/21/2018 he underwent a left femoral to above-knee popliteal artery bypass graft with ipsilateral non-reversed greater saphenous vein as well as the extensive profunda femoral endarterectomy. He did well initially however developed a fluid collection around the distal incision, and went back for I and D on 09/03/2017 with wound VAC placement. He began having significant right leg symptoms and on 04/22/2018 he underwent atherectomy and drug-coated balloon angioplasty of the right superficial femoral artery as well as angioplasty of the right external iliac artery. He recently underwent angiography for recurrent symptoms. Tandem near occlusive lesions were seen in the right superficial femoral artery and treated with primary stenting using 7 x 120 Elluvia stents.  He also had a 70% common femoral stenosis that was treated with a drug-coated balloon. Recommended to undergo right common femoral endarterectomy to address significant right common femoral plaque and an occluded ostium of the  profundofemoral artery.  Current smoker with 60y pack history.   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 Last seen 05/11/19. Per note, "Historically his liver disease has been well  compensated. EGD 2015 with no varices or portal gastropathy. Right upper quadrant ultrasound April 2020 reports liver to be within normal limits. Labs up-to-date back in April, MELD 6."  Per pt, instructed to hold Brilinta 5d preop and continue ASA.   Preop labs reviewed, mild hypoNa with sodium 132. Labs otherwise urnemarkable.   Periop device orders in Epic dated 03/28/20: Device Type:  Doctor, hospital and Phone #:  [employer] Jude/[employer]ott: [phone] Pacemaker Dependent?:  Yes.   Date of Last Device Check:  01/08/2020       Normal Device Function?:  Yes.    Electrophysiologist's Recommendations:  Have magnet available. Provide continuous ECG monitoring when magnet is used or reprogramming is to be performed.  Procedure should not interfere with device function.  No device programming or magnet placement needed.  EKG 03/22/20: Electronic ventricular pacemaker. Rate 68.   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  - Approximately 70-75% intra stent of the caval cavernous segment of  the right internal carotid artery stenosis within the previously  positioned Wingspan stent.  - Non opacification of the right anterior cerebral artery A1 segment.  This might be related to agenesis versus pathologic occlusion  related to intracranial arteriosclerosis.   Echocardiogram 08/01/2017 Study Conclusions   - Left ventricle: The cavity size was normal. Wall thickness was  normal. Systolic function was normal. The estimated ejection  fraction was in the  range of 50% to 55%. Wall motion was normal;  there were no regional wall motion abnormalities. Doppler  parameters are consistent with abnormal left ventricular  relaxation (grade 1 diastolic dysfunction). Indeterminate filling  pressures.  - Aortic valve: Trileaflet; mildly thickened leaflets.  - Mitral valve: Calcified annulus. Normal thickness leaflets .  There was mild regurgitation.  - Atrial septum: No defect or patent foramen ovale was identified.   Impressions:   - When compared to the study dated 99991111, LV systolic function  has since normalized. )      Anesthesia Quick Evaluation

## 2020-03-30 NOTE — Progress Notes (Signed)
Anesthesia Chart Review:  Follows with cardiology for hx of CAD status post CABG with previous hx of stenting, PAD, [employer] Jude biventricular ICD, hypertension, hyperlipidemia, HFrEF and ischemic cardiomyopathy (EF normalized 50-55% by Echo 9/18). Last seen by general cardiology 03/22/20, doing well at that time, discussed upcoming right femoral endarterectomy and stenting. Last seen by EP cardiology 12/23/19, noted to have class 2 HF symptoms, not limited by dyspnea. Main issue was LE vascular claudication. ICD was interrogated with normal device function.   Hx of significant PAD followed by Dr. [name]. Hx of TIA with subsequent R ICA stent 2009. He is s/p bilateral iliac stenting in 2015. He developed progressive symptoms and on09/21/2018 he underwent a left femoral to above-knee popliteal artery bypass graft with ipsilateral non-reversed greater saphenous vein as well as the extensive profunda femoral endarterectomy. He did well initially however developed a fluid collection around the distal incision, and went back for I and D on 09/03/2017 with wound VAC placement. He began having significant right leg symptoms and on 04/22/2018 he underwent atherectomy and drug-coated balloon angioplasty of the right superficial femoral artery as well as angioplasty of the right external iliac artery. He recently underwent angiography for recurrent symptoms. Tandem near occlusive lesions were seen in the right superficial femoral artery and treated with primary stenting using 7 x 120 Elluvia stents.  He also had a 70% common femoral stenosis that was treated with a drug-coated balloon. Recommended to undergo right common femoral endarterectomy to address significant right common femoral plaque and an occluded ostium of the profundofemoral artery.  Current smoker with 60y pack history.   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 Last seen 05/11/19. Per note, "Historically his liver disease has  been well compensated. EGD 2015 with no varices or portal gastropathy. Right upper quadrant ultrasound April 2020 reports liver to be within normal limits. Labs up-to-date back in April, MELD 6."  Per pt, instructed to hold Brilinta 5d preop and continue ASA.   Preop labs reviewed, mild hypoNa with sodium 132. Labs otherwise urnemarkable.   Periop device orders in Epic dated 03/28/20: Device Type:  Doctor, hospital and Phone #:  [employer] Jude/[employer]ott: [phone] Pacemaker Dependent?:  Yes.   Date of Last Device Check:  01/08/2020       Normal Device Function?:  Yes.    Electrophysiologist's Recommendations:   Have magnet available.  Provide continuous ECG monitoring when magnet is used or reprogramming is to be performed.   Procedure should not interfere with device function.  No device programming or magnet placement needed.  EKG 03/22/20: Electronic ventricular pacemaker. Rate 68.   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  - Approximately 70-75% intra stent of the caval cavernous segment of  the right internal carotid artery stenosis within the previously  positioned Wingspan stent.  - Non opacification of the right anterior cerebral artery A1 segment.  This might be related to agenesis versus pathologic occlusion  related to intracranial arteriosclerosis.   Echocardiogram 08/01/2017 Study Conclusions   - Left ventricle: The cavity size was normal. Wall thickness  was  normal. Systolic function was normal. The estimated ejection  fraction  was in the range of 50% to 55%. Wall motion was normal;  there were no regional wall motion abnormalities. Doppler  parameters are consistent with abnormal left ventricular  relaxation (grade 1 diastolic dysfunction). Indeterminate filling  pressures.  - Aortic valve: Trileaflet; mildly thickened leaflets.  - Mitral valve: Calcified annulus. Normal thickness leaflets .  There was mild regurgitation.  - Atrial septum: No defect or patent foramen ovale was identified.   Impressions:   - When compared to the study dated 99991111, LV systolic function  has since normalized.    [name] [facility] Short Stay Center/Anesthesiology Phone [phone] 03/30/2020 8:47 AM

## 2020-03-31 ENCOUNTER — Encounter (HOSPITAL_COMMUNITY): Payer: Self-pay | Admitting: Surgery

## 2020-03-31 ENCOUNTER — Other Ambulatory Visit: Payer: Self-pay

## 2020-03-31 ENCOUNTER — Inpatient Hospital Stay (HOSPITAL_COMMUNITY): Payer: Medicare Other

## 2020-03-31 ENCOUNTER — Inpatient Hospital Stay (HOSPITAL_COMMUNITY): Payer: Medicare Other | Admitting: Physician Assistant

## 2020-03-31 ENCOUNTER — Inpatient Hospital Stay (HOSPITAL_COMMUNITY): Payer: Medicare Other | Admitting: Anesthesiology

## 2020-03-31 ENCOUNTER — Encounter (HOSPITAL_COMMUNITY)
Admission: RE | Disposition: A | Discharge status: Home / Self Care | Payer: Self-pay | Source: Home / Self Care | Attending: Surgery

## 2020-03-31 ENCOUNTER — Inpatient Hospital Stay (HOSPITAL_COMMUNITY)
Admission: RE | Admit: 2020-03-31 | Discharge: 2020-04-01 | DRG: 271 | Disposition: A | Discharge status: Home / Self Care | Payer: Medicare Other | Attending: Surgery | Admitting: Surgery

## 2020-03-31 DIAGNOSIS — I5022 Chronic systolic (congestive) heart failure: Secondary | ICD-10-CM | POA: Diagnosis not present

## 2020-03-31 DIAGNOSIS — I509 Heart failure, unspecified: Secondary | ICD-10-CM | POA: Diagnosis present

## 2020-03-31 DIAGNOSIS — I110 Hypertensive heart disease with heart failure: Secondary | ICD-10-CM | POA: Diagnosis present

## 2020-03-31 DIAGNOSIS — F17210 Nicotine dependence, cigarettes, uncomplicated: Secondary | ICD-10-CM | POA: Diagnosis not present

## 2020-03-31 DIAGNOSIS — I739 Peripheral vascular disease, unspecified: Secondary | ICD-10-CM | POA: Diagnosis not present

## 2020-03-31 DIAGNOSIS — Z95820 Peripheral vascular angioplasty status with implants and grafts: Secondary | ICD-10-CM

## 2020-03-31 DIAGNOSIS — Z833 Family history of diabetes mellitus: Secondary | ICD-10-CM

## 2020-03-31 DIAGNOSIS — Z8249 Family history of ischemic heart disease and other diseases of the circulatory system: Secondary | ICD-10-CM

## 2020-03-31 DIAGNOSIS — Z8673 Personal history of transient ischemic attack (TIA), and cerebral infarction without residual deficits: Secondary | ICD-10-CM

## 2020-03-31 DIAGNOSIS — I252 Old myocardial infarction: Secondary | ICD-10-CM | POA: Diagnosis not present

## 2020-03-31 DIAGNOSIS — Z7982 Long term (current) use of aspirin: Secondary | ICD-10-CM | POA: Diagnosis not present

## 2020-03-31 DIAGNOSIS — Z8042 Family history of malignant neoplasm of prostate: Secondary | ICD-10-CM

## 2020-03-31 DIAGNOSIS — Z6837 Body mass index (BMI) 37.0-37.9, adult: Secondary | ICD-10-CM

## 2020-03-31 DIAGNOSIS — I708 Atherosclerosis of other arteries: Secondary | ICD-10-CM | POA: Diagnosis not present

## 2020-03-31 DIAGNOSIS — Z7902 Long term (current) use of antithrombotics/antiplatelets: Secondary | ICD-10-CM

## 2020-03-31 DIAGNOSIS — Z820 Family history of epilepsy and other diseases of the nervous system: Secondary | ICD-10-CM | POA: Diagnosis not present

## 2020-03-31 DIAGNOSIS — I70211 Atherosclerosis of native arteries of extremities with intermittent claudication, right leg: Principal | ICD-10-CM | POA: Diagnosis present

## 2020-03-31 DIAGNOSIS — Z96611 Presence of right artificial shoulder joint: Secondary | ICD-10-CM | POA: Diagnosis present

## 2020-03-31 DIAGNOSIS — Z95810 Presence of automatic (implantable) cardiac defibrillator: Secondary | ICD-10-CM

## 2020-03-31 DIAGNOSIS — I70213 Atherosclerosis of native arteries of extremities with intermittent claudication, bilateral legs: Secondary | ICD-10-CM | POA: Diagnosis not present

## 2020-03-31 DIAGNOSIS — Z79899 Other long term (current) drug therapy: Secondary | ICD-10-CM

## 2020-03-31 DIAGNOSIS — J441 Chronic obstructive pulmonary disease with (acute) exacerbation: Secondary | ICD-10-CM | POA: Diagnosis not present

## 2020-03-31 DIAGNOSIS — Z20822 Contact with and (suspected) exposure to covid-19: Secondary | ICD-10-CM | POA: Diagnosis not present

## 2020-03-31 DIAGNOSIS — D62 Acute posthemorrhagic anemia: Secondary | ICD-10-CM | POA: Diagnosis present

## 2020-03-31 DIAGNOSIS — E7800 Pure hypercholesterolemia, unspecified: Secondary | ICD-10-CM | POA: Diagnosis not present

## 2020-03-31 HISTORY — PX: PATCH ANGIOPLASTY: SHX6230

## 2020-03-31 HISTORY — PX: INSERTION OF ILIAC STENT: SHX6256

## 2020-03-31 HISTORY — PX: ENDARTERECTOMY FEMORAL: SHX5804

## 2020-03-31 LAB — CBC
HCT: 28.7 % — ABNORMAL LOW (ref 39.0–52.0)
HCT: 26.2 % — ABNORMAL LOW (ref 39.0–52.0)
Hemoglobin: 9.8 g/dL — ABNORMAL LOW (ref 13.0–17.0)
Hemoglobin: 8.7 g/dL — ABNORMAL LOW (ref 13.0–17.0)
MCH: 32.1 pg (ref 26.0–34.0)
MCH: 31 pg (ref 26.0–34.0)
MCHC: 34.1 g/dL (ref 30.0–36.0)
MCHC: 33.2 g/dL (ref 30.0–36.0)
MCV: 94.1 fL (ref 80.0–100.0)
MCV: 93.2 fL (ref 80.0–100.0)
Platelets: 191 10*3/uL (ref 150–400)
Platelets: 181 10*3/uL (ref 150–400)
RBC: 3.05 MIL/uL — ABNORMAL LOW (ref 4.22–5.81)
RBC: 2.81 MIL/uL — ABNORMAL LOW (ref 4.22–5.81)
RDW: 12.6 % (ref 11.5–15.5)
RDW: 12.8 % (ref 11.5–15.5)
WBC: 10.6 10*3/uL — ABNORMAL HIGH (ref 4.0–10.5)
WBC: 7.7 10*3/uL (ref 4.0–10.5)
nRBC: 0 % (ref 0.0–0.2)
nRBC: 0 % (ref 0.0–0.2)

## 2020-03-31 LAB — BASIC METABOLIC PANEL
Anion gap: 10 (ref 5–15)
BUN: 10 mg/dL (ref 8–23)
CO2: 23 mmol/L (ref 22–32)
Calcium: 8.1 mg/dL — ABNORMAL LOW (ref 8.9–10.3)
Chloride: 102 mmol/L (ref 98–111)
Creatinine, Ser: 0.86 mg/dL (ref 0.61–1.24)
GFR calc Af Amer: >60 mL/min (ref >60)
GFR calc non Af Amer: >60 mL/min (ref >60)
Glucose, Bld: 164 mg/dL — ABNORMAL HIGH (ref 70–99)
Potassium: 4.4 mmol/L (ref 3.5–5.1)
Sodium: 135 mmol/L (ref 135–145)

## 2020-03-31 LAB — CREATININE, SERUM
Creatinine, Ser: 0.68 mg/dL (ref 0.61–1.24)
GFR calc Af Amer: >60 mL/min (ref >60)
GFR calc non Af Amer: >60 mL/min (ref >60)

## 2020-03-31 LAB — SARS CORONAVIRUS 2 (TAT 6-24 HRS): SARS Coronavirus 2: NEGATIVE

## 2020-03-31 LAB — PREPARE RBC (CROSSMATCH)

## 2020-03-31 SURGERY — ENDARTERECTOMY, FEMORAL
Anesthesia: General | Site: Groin | Laterality: Right

## 2020-03-31 MED ORDER — HEPARIN SODIUM (PORCINE) 1000 UNIT/ML IJ SOLN
INTRAMUSCULAR | Status: AC
  Filled 2020-03-31: qty 1

## 2020-03-31 MED ORDER — BISACODYL 5 MG PO TBEC: 5.0000 mg | DELAYED_RELEASE_TABLET | Freq: Every day | ORAL | Status: DC | PRN

## 2020-03-31 MED ORDER — PROTAMINE SULFATE 10 MG/ML IV SOLN
INTRAVENOUS | Status: AC
  Filled 2020-03-31: qty 10

## 2020-03-31 MED ORDER — 0.9 % SODIUM CHLORIDE (POUR BTL) OPTIME
TOPICAL | Status: DC | PRN
  Administered 2020-03-31: 500 mL
  Administered 2020-03-31: 1000 mL

## 2020-03-31 MED ORDER — ATORVASTATIN CALCIUM 10 MG PO TABS
20.0000 mg | ORAL_TABLET | Freq: Every day | ORAL | Status: DC
  Administered 2020-04-01: 10:00:00 20 mg via ORAL
  Filled 2020-03-31: qty 2

## 2020-03-31 MED ORDER — ROCURONIUM BROMIDE 10 MG/ML (PF) SYRINGE
PREFILLED_SYRINGE | INTRAVENOUS | Status: AC
  Filled 2020-03-31: qty 10

## 2020-03-31 MED ORDER — OXYCODONE HCL 5 MG PO TABS
5.0000 mg | ORAL_TABLET | ORAL | Status: DC | PRN
  Administered 2020-04-01: 01:00:00 10 mg via ORAL
  Filled 2020-03-31: qty 2

## 2020-03-31 MED ORDER — ONDANSETRON HCL 4 MG/2ML IJ SOLN
INTRAMUSCULAR | Status: AC
  Filled 2020-03-31: qty 2

## 2020-03-31 MED ORDER — PHENYLEPHRINE 40 MCG/ML (10ML) SYRINGE FOR IV PUSH (FOR BLOOD PRESSURE SUPPORT)
PREFILLED_SYRINGE | INTRAVENOUS | Status: AC
  Filled 2020-03-31: qty 10

## 2020-03-31 MED ORDER — ROCURONIUM BROMIDE 10 MG/ML (PF) SYRINGE
PREFILLED_SYRINGE | INTRAVENOUS | Status: DC | PRN
  Administered 2020-03-31: 50 mg via INTRAVENOUS

## 2020-03-31 MED ORDER — GABAPENTIN 300 MG PO CAPS
300.0000 mg | ORAL_CAPSULE | Freq: Two times a day (BID) | ORAL | Status: DC
  Administered 2020-03-31 – 2020-04-01 (×2): 300 mg via ORAL
  Filled 2020-03-31 (×2): qty 1

## 2020-03-31 MED ORDER — CHLORHEXIDINE GLUCONATE CLOTH 2 % EX PADS: 6.0000 | MEDICATED_PAD | Freq: Once | CUTANEOUS | Status: DC

## 2020-03-31 MED ORDER — ZOLPIDEM TARTRATE 5 MG PO TABS: 5.0000 mg | ORAL_TABLET | Freq: Every evening | ORAL | Status: DC | PRN

## 2020-03-31 MED ORDER — HEPARIN SODIUM (PORCINE) 5000 UNIT/ML IJ SOLN
5000.0000 [IU] | Freq: Three times a day (TID) | INTRAMUSCULAR | Status: DC
  Administered 2020-04-01: 5000 [IU] via SUBCUTANEOUS
  Filled 2020-03-31: qty 1

## 2020-03-31 MED ORDER — PROTAMINE SULFATE 10 MG/ML IV SOLN
INTRAVENOUS | Status: AC
  Filled 2020-03-31: qty 5

## 2020-03-31 MED ORDER — TAMSULOSIN HCL 0.4 MG PO CAPS
0.4000 mg | ORAL_CAPSULE | Freq: Two times a day (BID) | ORAL | Status: DC
  Administered 2020-03-31 – 2020-04-01 (×2): 0.4 mg via ORAL
  Filled 2020-03-31 (×2): qty 1

## 2020-03-31 MED ORDER — SENNOSIDES-DOCUSATE SODIUM 8.6-50 MG PO TABS: 1.0000 | ORAL_TABLET | Freq: Every evening | ORAL | Status: DC | PRN

## 2020-03-31 MED ORDER — PROTAMINE SULFATE 10 MG/ML IV SOLN
INTRAVENOUS | Status: DC | PRN
  Administered 2020-03-31: 40 mg via INTRAVENOUS
  Administered 2020-03-31 (×2): 10 mg via INTRAVENOUS
  Administered 2020-03-31: 40 mg via INTRAVENOUS

## 2020-03-31 MED ORDER — IODIXANOL 320 MG/ML IV SOLN
INTRAVENOUS | Status: DC | PRN
  Administered 2020-03-31: 10 mL
  Administered 2020-03-31: 50 mL

## 2020-03-31 MED ORDER — HYDRALAZINE HCL 20 MG/ML IJ SOLN: 5.0000 mg | INTRAMUSCULAR | Status: DC | PRN

## 2020-03-31 MED ORDER — LABETALOL HCL 5 MG/ML IV SOLN: 10.0000 mg | INTRAVENOUS | Status: DC | PRN

## 2020-03-31 MED ORDER — SODIUM CHLORIDE 0.9 % IV SOLN: INTRAVENOUS | Status: DC

## 2020-03-31 MED ORDER — FENTANYL CITRATE (PF) 100 MCG/2ML IJ SOLN
INTRAMUSCULAR | Status: AC
  Filled 2020-03-31: qty 2

## 2020-03-31 MED ORDER — DOCUSATE SODIUM 100 MG PO CAPS
100.0000 mg | ORAL_CAPSULE | Freq: Every day | ORAL | Status: DC
  Filled 2020-03-31: qty 1

## 2020-03-31 MED ORDER — ASPIRIN EC 81 MG PO TBEC
81.0000 mg | DELAYED_RELEASE_TABLET | Freq: Every evening | ORAL | Status: DC
  Administered 2020-03-31: 81 mg via ORAL
  Filled 2020-03-31: qty 1

## 2020-03-31 MED ORDER — PROPOFOL 10 MG/ML IV BOLUS
INTRAVENOUS | Status: DC | PRN
  Administered 2020-03-31: 80 mg via INTRAVENOUS

## 2020-03-31 MED ORDER — FENTANYL CITRATE (PF) 250 MCG/5ML IJ SOLN
INTRAMUSCULAR | Status: AC
  Filled 2020-03-31: qty 5

## 2020-03-31 MED ORDER — PHENOL 1.4 % MT LIQD: 1.0000 | OROMUCOSAL | Status: DC | PRN

## 2020-03-31 MED ORDER — SODIUM CHLORIDE 0.9 % IV SOLN
INTRAVENOUS | Status: DC | PRN
  Administered 2020-03-31: 500 mL

## 2020-03-31 MED ORDER — MEPERIDINE HCL 25 MG/ML IJ SOLN: 6.2500 mg | INTRAMUSCULAR | Status: DC | PRN

## 2020-03-31 MED ORDER — MORPHINE SULFATE (PF) 2 MG/ML IV SOLN: 2.0000 mg | INTRAVENOUS | Status: DC | PRN

## 2020-03-31 MED ORDER — LIDOCAINE 2% (20 MG/ML) 5 ML SYRINGE
INTRAMUSCULAR | Status: AC
  Filled 2020-03-31: qty 5

## 2020-03-31 MED ORDER — CARVEDILOL 3.125 MG PO TABS
3.1250 mg | ORAL_TABLET | Freq: Two times a day (BID) | ORAL | Status: DC
  Administered 2020-03-31 – 2020-04-01 (×2): 3.125 mg via ORAL
  Filled 2020-03-31 (×2): qty 1

## 2020-03-31 MED ORDER — CEFAZOLIN SODIUM-DEXTROSE 2-4 GM/100ML-% IV SOLN
INTRAVENOUS | Status: AC
  Filled 2020-03-31: qty 100

## 2020-03-31 MED ORDER — MIDAZOLAM HCL 2 MG/2ML IJ SOLN
INTRAMUSCULAR | Status: DC | PRN
  Administered 2020-03-31: 2 mg via INTRAVENOUS

## 2020-03-31 MED ORDER — PROMETHAZINE HCL 25 MG/ML IJ SOLN: 6.2500 mg | INTRAMUSCULAR | Status: DC | PRN

## 2020-03-31 MED ORDER — PHENYLEPHRINE HCL (PRESSORS) 10 MG/ML IV SOLN
INTRAVENOUS | Status: AC
  Filled 2020-03-31: qty 2

## 2020-03-31 MED ORDER — PHENYLEPHRINE 40 MCG/ML (10ML) SYRINGE FOR IV PUSH (FOR BLOOD PRESSURE SUPPORT)
PREFILLED_SYRINGE | INTRAVENOUS | Status: DC | PRN
  Administered 2020-03-31: 80 ug via INTRAVENOUS
  Administered 2020-03-31 (×2): 120 ug via INTRAVENOUS

## 2020-03-31 MED ORDER — METOPROLOL TARTRATE 5 MG/5ML IV SOLN: 2.0000 mg | INTRAVENOUS | Status: DC | PRN

## 2020-03-31 MED ORDER — ONDANSETRON HCL 4 MG/2ML IJ SOLN
INTRAMUSCULAR | Status: DC | PRN
  Administered 2020-03-31: 4 mg via INTRAVENOUS

## 2020-03-31 MED ORDER — FENTANYL CITRATE (PF) 100 MCG/2ML IJ SOLN
25.0000 ug | INTRAMUSCULAR | Status: DC | PRN
  Administered 2020-03-31: 25 ug via INTRAVENOUS

## 2020-03-31 MED ORDER — HEMOSTATIC AGENTS (NO CHARGE) OPTIME
TOPICAL | Status: DC | PRN
  Administered 2020-03-31 (×2): 1 via TOPICAL

## 2020-03-31 MED ORDER — SUGAMMADEX SODIUM 200 MG/2ML IV SOLN
INTRAVENOUS | Status: DC | PRN
  Administered 2020-03-31: 200 mg via INTRAVENOUS

## 2020-03-31 MED ORDER — CEFAZOLIN SODIUM-DEXTROSE 2-4 GM/100ML-% IV SOLN
2.0000 g | Freq: Three times a day (TID) | INTRAVENOUS | Status: AC
  Administered 2020-03-31 – 2020-04-01 (×2): 2 g via INTRAVENOUS
  Filled 2020-03-31 (×2): qty 100

## 2020-03-31 MED ORDER — MIDAZOLAM HCL 2 MG/2ML IJ SOLN: 0.5000 mg | Freq: Once | INTRAMUSCULAR | Status: DC | PRN

## 2020-03-31 MED ORDER — PANTOPRAZOLE SODIUM 40 MG PO TBEC
40.0000 mg | DELAYED_RELEASE_TABLET | Freq: Every day | ORAL | Status: DC
  Filled 2020-03-31: qty 1

## 2020-03-31 MED ORDER — ONDANSETRON HCL 4 MG/2ML IJ SOLN: 4.0000 mg | Freq: Four times a day (QID) | INTRAMUSCULAR | Status: DC | PRN

## 2020-03-31 MED ORDER — LACTATED RINGERS IV SOLN: INTRAVENOUS | Status: DC | PRN

## 2020-03-31 MED ORDER — ACETAMINOPHEN 325 MG PO TABS
325.0000 mg | ORAL_TABLET | ORAL | Status: DC | PRN
  Administered 2020-03-31 – 2020-04-01 (×2): 650 mg via ORAL
  Filled 2020-03-31 (×2): qty 2

## 2020-03-31 MED ORDER — GUAIFENESIN-DM 100-10 MG/5ML PO SYRP: 15.0000 mL | ORAL_SOLUTION | ORAL | Status: DC | PRN

## 2020-03-31 MED ORDER — PROPOFOL 10 MG/ML IV BOLUS
INTRAVENOUS | Status: AC
  Filled 2020-03-31: qty 20

## 2020-03-31 MED ORDER — SACUBITRIL-VALSARTAN 49-51 MG PO TABS
1.0000 | ORAL_TABLET | Freq: Two times a day (BID) | ORAL | Status: DC
  Administered 2020-03-31 – 2020-04-01 (×2): 1 via ORAL
  Filled 2020-03-31 (×2): qty 1

## 2020-03-31 MED ORDER — DEXAMETHASONE SODIUM PHOSPHATE 10 MG/ML IJ SOLN
INTRAMUSCULAR | Status: AC
  Filled 2020-03-31: qty 1

## 2020-03-31 MED ORDER — HEPARIN SODIUM (PORCINE) 1000 UNIT/ML IJ SOLN
INTRAMUSCULAR | Status: DC | PRN
  Administered 2020-03-31: 7000 [IU] via INTRAVENOUS
  Administered 2020-03-31: 1000 [IU] via INTRAVENOUS
  Administered 2020-03-31: 7000 [IU] via INTRAVENOUS
  Administered 2020-03-31: 2000 [IU] via INTRAVENOUS

## 2020-03-31 MED ORDER — MAGNESIUM SULFATE 2 GM/50ML IV SOLN: 2.0000 g | Freq: Every day | INTRAVENOUS | Status: DC | PRN

## 2020-03-31 MED ORDER — PHENYLEPHRINE HCL-NACL 10-0.9 MG/250ML-% IV SOLN
INTRAVENOUS | Status: DC | PRN
  Administered 2020-03-31: 15 ug/min via INTRAVENOUS

## 2020-03-31 MED ORDER — MIDAZOLAM HCL 2 MG/2ML IJ SOLN
INTRAMUSCULAR | Status: AC
  Filled 2020-03-31: qty 2

## 2020-03-31 MED ORDER — EPHEDRINE SULFATE-NACL 50-0.9 MG/10ML-% IV SOSY
PREFILLED_SYRINGE | INTRAVENOUS | Status: DC | PRN
  Administered 2020-03-31 (×2): 10 mg via INTRAVENOUS
  Administered 2020-03-31: 5 mg via INTRAVENOUS
  Administered 2020-03-31 (×2): 10 mg via INTRAVENOUS

## 2020-03-31 MED ORDER — POTASSIUM CHLORIDE CRYS ER 20 MEQ PO TBCR: 20.0000 meq | EXTENDED_RELEASE_TABLET | Freq: Every day | ORAL | Status: DC | PRN

## 2020-03-31 MED ORDER — DEXAMETHASONE SODIUM PHOSPHATE 10 MG/ML IJ SOLN
INTRAMUSCULAR | Status: DC | PRN
  Administered 2020-03-31: 5 mg via INTRAVENOUS

## 2020-03-31 MED ORDER — SODIUM CHLORIDE 0.9 % IV SOLN: 500.0000 mL | Freq: Once | INTRAVENOUS | Status: DC | PRN

## 2020-03-31 MED ORDER — CEFAZOLIN SODIUM-DEXTROSE 2-4 GM/100ML-% IV SOLN
2.0000 g | INTRAVENOUS | Status: AC
  Administered 2020-03-31: 2 g via INTRAVENOUS

## 2020-03-31 MED ORDER — SODIUM CHLORIDE 0.9 % IV SOLN
INTRAVENOUS | Status: AC
  Filled 2020-03-31: qty 1.2

## 2020-03-31 MED ORDER — FENTANYL CITRATE (PF) 250 MCG/5ML IJ SOLN
INTRAMUSCULAR | Status: DC | PRN
  Administered 2020-03-31 (×2): 50 ug via INTRAVENOUS
  Administered 2020-03-31: 100 ug via INTRAVENOUS
  Administered 2020-03-31: 50 ug via INTRAVENOUS

## 2020-03-31 MED ORDER — ALUM & MAG HYDROXIDE-SIMETH 200-200-20 MG/5ML PO SUSP: 15.0000 mL | ORAL | Status: DC | PRN

## 2020-03-31 MED ORDER — ACETAMINOPHEN 325 MG RE SUPP: 325.0000 mg | RECTAL | Status: DC | PRN

## 2020-03-31 MED ORDER — ALBUMIN HUMAN 5 % IV SOLN
INTRAVENOUS | Status: DC | PRN
Start: 2020-03-31 — End: 2020-03-31

## 2020-03-31 SURGICAL SUPPLY — 77 items
ADH SKN CLS APL DERMABOND .7 (GAUZE/BANDAGES/DRESSINGS) ×3
AGENT HMST KT MTR STRL THRMB (HEMOSTASIS) ×3
BALLN MUSTANG 6X150X135 (BALLOONS) ×5
BALLN MUSTANG 7X60X75 (BALLOONS) ×10
BALLOON MUSTANG 6X150X135 (BALLOONS) IMPLANT
BALLOON MUSTANG 7X60X75 (BALLOONS) IMPLANT
CANISTER SUCT 3000ML PPV (MISCELLANEOUS) ×5 IMPLANT
CATH EMB 4FR 80CM (CATHETERS) ×2 IMPLANT
CATH SOFT-VU 4F 65 STRAIGHT (CATHETERS) IMPLANT
CATH SOFT-VU STRAIGHT 4F 65CM (CATHETERS) ×5
CLIP VESOCCLUDE MED 24/CT (CLIP) ×5 IMPLANT
CLIP VESOCCLUDE SM WIDE 24/CT (CLIP) ×5 IMPLANT
COVER DOME SNAP 22 D (MISCELLANEOUS) ×2 IMPLANT
COVER SURGICAL LIGHT HANDLE (MISCELLANEOUS) ×2 IMPLANT
DERMABOND ADVANCED (GAUZE/BANDAGES/DRESSINGS) ×2
DERMABOND ADVANCED .7 DNX12 (GAUZE/BANDAGES/DRESSINGS) ×3 IMPLANT
DRAIN CHANNEL 15F RND FF W/TCR (WOUND CARE) IMPLANT
DRAPE X-RAY CASS 24X20 (DRAPES) IMPLANT
DRSG TEGADERM 4X4.75 (GAUZE/BANDAGES/DRESSINGS) ×6 IMPLANT
ELECT REM PT RETURN 9FT ADLT (ELECTROSURGICAL) ×5
ELECTRODE REM PT RTRN 9FT ADLT (ELECTROSURGICAL) ×3 IMPLANT
EVACUATOR SILICONE 100CC (DRAIN) IMPLANT
GLOVE BIO SURGEON STRL SZ 6 (GLOVE) ×4 IMPLANT
GLOVE BIO SURGEON STRL SZ 6.5 (GLOVE) ×3 IMPLANT
GLOVE BIO SURGEONS STRL SZ 6.5 (GLOVE) ×3
GLOVE BIOGEL PI IND STRL 6 (GLOVE) IMPLANT
GLOVE BIOGEL PI IND STRL 6.5 (GLOVE) IMPLANT
GLOVE BIOGEL PI IND STRL 7.0 (GLOVE) IMPLANT
GLOVE BIOGEL PI IND STRL 7.5 (GLOVE) ×3 IMPLANT
GLOVE BIOGEL PI INDICATOR 6 (GLOVE) ×2
GLOVE BIOGEL PI INDICATOR 6.5 (GLOVE) ×6
GLOVE BIOGEL PI INDICATOR 7.0 (GLOVE) ×6
GLOVE BIOGEL PI INDICATOR 7.5 (GLOVE) ×4
GLOVE SURG SS PI 7.5 STRL IVOR (GLOVE) ×13 IMPLANT
GOWN STRL REUS W/ TWL LRG LVL3 (GOWN DISPOSABLE) ×6 IMPLANT
GOWN STRL REUS W/ TWL XL LVL3 (GOWN DISPOSABLE) ×3 IMPLANT
GOWN STRL REUS W/TWL LRG LVL3 (GOWN DISPOSABLE) ×40
GOWN STRL REUS W/TWL XL LVL3 (GOWN DISPOSABLE) ×10
GRAFT VASC PATCH XENOSURE 1X14 (Vascular Products) ×2 IMPLANT
HEMOSTAT SNOW SURGICEL 2X4 (HEMOSTASIS) ×2 IMPLANT
KIT BASIN OR (CUSTOM PROCEDURE TRAY) ×5 IMPLANT
KIT ENCORE 26 ADVANTAGE (KITS) ×2 IMPLANT
KIT TURNOVER KIT B (KITS) ×5 IMPLANT
NS IRRIG 1000ML POUR BTL (IV SOLUTION) ×10 IMPLANT
PACK PERIPHERAL VASCULAR (CUSTOM PROCEDURE TRAY) ×5 IMPLANT
PAD ARMBOARD 7.5X6 YLW CONV (MISCELLANEOUS) ×10 IMPLANT
SET COLLECT BLD 21X3/4 12 (NEEDLE) IMPLANT
SET MICROPUNCTURE 5F STIFF (MISCELLANEOUS) ×4 IMPLANT
SET WALTER ACTIVATION W/DRAPE (SET/KITS/TRAYS/PACK) IMPLANT
SHEATH BRITE TIP 7FRX11 (SHEATH) ×2 IMPLANT
SHEATH PINNACLE ST 7F 45CM (SHEATH) ×2 IMPLANT
SPONGE INTESTINAL PEANUT (DISPOSABLE) ×5 IMPLANT
STENT ELUVIA 7X120X130 (Permanent Stent) ×2 IMPLANT
STENT OMNILINK ELITE 8X19X80 (Permanent Stent) ×2 IMPLANT
STENT VIABAHN 7X50X120 (Permanent Stent) ×10 IMPLANT
STENT VIABAHN 7X5X120 7FR (Permanent Stent) IMPLANT
STOPCOCK 4 WAY LG BORE MALE ST (IV SETS) IMPLANT
STOPCOCK MORSE 400PSI 3WAY (MISCELLANEOUS) ×2 IMPLANT
SURGIFLO W/THROMBIN 8M KIT (HEMOSTASIS) ×2 IMPLANT
SUT ETHILON 3 0 PS 1 (SUTURE) IMPLANT
SUT PROLENE 5 0 C 1 24 (SUTURE) ×21 IMPLANT
SUT PROLENE 6 0 BV (SUTURE) ×5 IMPLANT
SUT VIC AB 2-0 CT1 27 (SUTURE) ×5
SUT VIC AB 2-0 CT1 TAPERPNT 27 (SUTURE) ×3 IMPLANT
SUT VIC AB 3-0 SH 27 (SUTURE) ×5
SUT VIC AB 3-0 SH 27X BRD (SUTURE) ×3 IMPLANT
SUT VIC AB 3-0 X1 27 (SUTURE) ×5 IMPLANT
SYR 10ML LL (SYRINGE) ×6 IMPLANT
SYR 20ML LL LF (SYRINGE) ×2 IMPLANT
TOWEL GREEN STERILE (TOWEL DISPOSABLE) ×5 IMPLANT
TRAY URETHRAL FOLEY CATH 14FR (CATHETERS) ×2 IMPLANT
TUBING EXTENTION W/L.L. (IV SETS) IMPLANT
UNDERPAD 30X30 (UNDERPADS AND DIAPERS) ×5 IMPLANT
WATER STERILE IRR 1000ML POUR (IV SOLUTION) ×5 IMPLANT
WIRE BENTSON .035X145CM (WIRE) ×2 IMPLANT
WIRE G V18X300CM (WIRE) ×4 IMPLANT
WIRE HI TORQ VERSACORE J 260CM (WIRE) ×4 IMPLANT

## 2020-03-31 NOTE — Progress Notes (Signed)
Pt received to room, alert and oriented, drowsy, with palpable pedal pulses bilaterally and right groin incision soft, no bruising or drainage.  VS stable.  CCMD notified.  Will con't plan of care.

## 2020-03-31 NOTE — Transfer of Care (Signed)
Immediate Anesthesia Transfer of Care Note  Patient: Roy [name]  Procedure(s) Performed: RIGHT FEMORAL ENDARTERECTOMY (Right ) PATCH ANGIOPLASTY USING XENOSURE BIOLOGIC PATCH 1cm x 14cm (Right Groin) INSERTION OF RIGHT ILIAC ARTERY STENT AND RIGHT SUPERFICIAL FEMORAL ARTERY STENT  Patient Location: PACU  Anesthesia Type:General  Level of Consciousness: drowsy and patient cooperative  Airway & Oxygen Therapy: Patient Spontanous Breathing and Patient connected to face mask oxygen  Post-op Assessment: Report given to RN and Post -op Vital signs reviewed and stable  Post vital signs: Reviewed and stable  Last Vitals:  Vitals Value Taken Time  BP 113/50 03/31/20 1229  Temp    Pulse 100 03/31/20 1231  Resp 20 03/31/20 1231  SpO2 99 % 03/31/20 1231  Vitals shown include unvalidated device data.  Last Pain:  Vitals:   03/31/20 0601  TempSrc:   PainSc: 0-No pain         Complications: No apparent anesthesia complications

## 2020-03-31 NOTE — Anesthesia Procedure Notes (Signed)
Procedure Name: Intubation Date/Time: 03/31/2020 8:00 AM Performed by: Kathryne Hitch, CRNA Pre-anesthesia Checklist: Patient identified, Emergency Drugs available, Suction available and Patient being monitored Patient Re-evaluated:Patient Re-evaluated prior to induction Oxygen Delivery Method: Circle system utilized Preoxygenation: Pre-oxygenation with 100% oxygen Induction Type: IV induction Ventilation: Mask ventilation without difficulty Laryngoscope Size: Miller and 3 Grade View: Grade I Tube type: Oral Tube size: 7.5 mm Number of attempts: 1 Airway Equipment and Method: Stylet and Oral airway Placement Confirmation: ETT inserted through vocal cords under direct vision,  positive ETCO2 and breath sounds checked- equal and bilateral Secured at: 23 cm Tube secured with: Tape Dental Injury: Teeth and Oropharynx as per pre-operative assessment

## 2020-03-31 NOTE — Interval H&P Note (Signed)
History and Physical Interval Note:  03/31/2020 7:26 AM  Roy [name]  has presented today for surgery, with the diagnosis of ATHEROSCLEROSIS OF BILATERAL EXTEMITY WITH CLAUDICATION.  The various methods of treatment have been discussed with the patient and family. After consideration of risks, benefits and other options for treatment, the patient has consented to  Procedure(s): RIGHT ENDARTERECTOMY FEMORAL WITH RIGHT ILIAC STENT (Right) as a surgical intervention.  The patient's history has been reviewed, patient examined, no change in status, stable for surgery.  I have reviewed the patient's chart and labs.  Questions were answered to the patient's satisfaction.     Annamarie Major  Discussed with patient that we need to proceed with femoral endarterectomy and iliac stent.  He understands and wants to proceed  WB

## 2020-03-31 NOTE — Op Note (Signed)
Patient name: Roy [name] MRN: [id number] DOB: [birth year] Sex: male  03/31/2020 Pre-operative Diagnosis: Right leg claudication Post-operative diagnosis:  Same Surgeon:  Annamarie Major Assistants:  Ivin Booty Procedure:   #1: Right external iliac, common femoral, profundofemoral, and superficial femoral endarterectomy with bovine pericardial patch angioplasty   #2: Stent, right common iliac artery (8 x 19 Herculink)   #3: Stent, right superficial femoral artery Anesthesia: General Blood Loss: 1.5L Specimens: None  Findings: A 70% right common iliac calcified lesion was stented with subsequent stenosis less than 20% and extensive femoral endarterectomy was performed beginning in the external iliac artery down into the superficial femoral artery.  The patient's prior superficial femoral artery stent was encountered and was not well incorporated.  It was ultimately removed.  I ended up restenting this area.  There was an area of extravasation which required treatment with covered stents using 2 7 x 50Viabahn covered stents.  I performed a eversion endarterectomy of the profundofemoral artery.  Indications: The patient is having significant claudication in the right leg.  He recently underwent superficial femoral artery stenting.  He was found to have extensive common femoral disease with occlusion of the profundofemoral artery and so endarterectomy was recommended.  I also discussed stenting his 70% common iliac lesion.  Procedure:  The patient was identified in the holding area and taken to [facility]  The patient was then placed supine on the table. general anesthesia was administered.  The patient was prepped and draped in the usual sterile fashion.  A time out was called and antibiotics were administered.  A longitudinal incision was made in the right groin.  Cautery was used about subcutaneous tissue down the femoral sheath which was opened sharply.  I exposed the distal external iliac  artery under the inguinal ligament.  The common femoral artery was fully mobilized.  I dissected out the profundofemoral artery down to its tertiary branches.  Approximately 5 cm of the superficial femoral artery was exposed.  This was heavily calcified as well as the common femoral and profundofemoral artery.  Once I had adequate exposure the patient was fully heparinized.  After the heparin circulated, the vessels were occluded a 11 blade was used to make a arteriotomy which was extended longitudinally with Potts scissors up to the inguinal ligament.  I then extended this down onto the superficial femoral artery which was heavily calcified.  I used Potts scissors to cut down to a soft area.  In doing so I cut through the prior superficial femoral artery stent.  This unraveled and I pulled it and ended up pulling out the majority of the stent.  I was able to perform endarterectomy of the superficial femoral artery as well as the common femoral artery removing near occlusive plaque.  A #4 Fogarty catheter was then inserted into the external iliac artery for proximal control and I completed the endarterectomy up into the distal external iliac artery.  The patient had excellent inflow at this time.  I then performed a eversion endarterectomy of the profundofemoral artery removing plaque down to the secondary branches.  There was good backbleeding.  Next a bovine pericardial patch was selected.  Patch angioplasty was performed using a running 5-0 Prolene suture.  Prior to completion the appropriate flushing maneuvers were performed the anastomosis was completed.  Several repair stitches were required for hemostasis.  Once this was hemostatic, I used a micropuncture needle to gain access through the patch.  An 018 wire  was advanced up into the iliac system followed by micropuncture sheath.  I then inserted a 035 wire followed by a 7 x 45 sheath.  Arteriogram was then performed which identified the greater than 70% right  common iliac stenosis with an exophytic plaque at this level.  I was initially planning a covered stent however the lesion length down to the hypogastric artery precluded using a VBX stent because it was not short enough.  I then elected to perform primary stenting using it uncovered Omnilink 8 x 19 stent.  This was deployed and follow-up imaging revealed stenosis less than 20%.  The sheath was then removed and then directed down into the superficial femoral artery.  I then selected a 7 x 120 Elluvia stent and deployed this up to the origin of the patch.  It was postdilated with a 6 mm balloon.  Follow-up imaging revealed persistent stenosis within the proximal superficial femoral artery and so I redilated it with a 7 mm balloon.  On follow-up imaging, there appeared to be perforation within the proximal superficial femoral artery.  I tried to repair this primarily with a 5-0 Prolene suture however this was not satisfactory.  I felt that a covered stent needed to be placed and so a 7 x 50 Viabahn stent was deployed and postdilated.  I do not see a contrast blush however when I manually inspected the artery there was still bleeding from the arterial wall.  This appeared to be coming from below the Viabahn stent and so a second 7 x 50 Viabahn stent was deployed and postdilated.  After this, the bleeding had resolved.  The wound was then irrigated.  It was hemostatic.  The heparin was reversed with protamine.  The patient had brisk Doppler signals in the profundofemoral superficial femoral and common femoral artery.  I reapproximated the femoral sheath with 2-0 Vicryl, subcutaneous tissue with additionally of 2-0 Vicryl followed by a layer with 3-0 Vicryl and 4-0 subcuticular suture.  Dermabond was applied.  Patient tolerated the procedure well was extubated and taken the recovery room in stable condition.[name]  He did receive 1 unit of blood in the OR.   Disposition: To PACU stable   V. Annamarie Major, M.D.,  [facility] [place] Office: [phone] Pager:  [phone]

## 2020-03-31 NOTE — Progress Notes (Signed)
S/p right femoral endarterectomy and iliac / SFA stenting  Brisk DP/PT doppler signals Groin soft without hematoma  Restart DAPT tomorrow if no evidence of hematoma   [name]

## 2020-04-01 ENCOUNTER — Encounter (HOSPITAL_COMMUNITY): Payer: Medicare Other

## 2020-04-01 ENCOUNTER — Encounter (HOSPITAL_COMMUNITY): Payer: Self-pay | Admitting: Surgery

## 2020-04-01 LAB — POCT I-STAT 7, (LYTES, BLD GAS, ICA,H+H)
Acid-base deficit: 1 mmol/L (ref 0.0–2.0)
Bicarbonate: 24.1 mmol/L (ref 20.0–28.0)
Calcium, Ion: 1.1 mmol/L — ABNORMAL LOW (ref 1.15–1.40)
HCT: 28 % — ABNORMAL LOW (ref 39.0–52.0)
Hemoglobin: 9.5 g/dL — ABNORMAL LOW (ref 13.0–17.0)
O2 Saturation: 100 %
Patient temperature: 37.4
Potassium: 4.2 mmol/L (ref 3.5–5.1)
Sodium: 136 mmol/L (ref 135–145)
TCO2: 25 mmol/L (ref 22–32)
pCO2 arterial: 39 mmHg (ref 32.0–48.0)
pH, Arterial: 7.4 (ref 7.350–7.450)
pO2, Arterial: 182 mmHg — ABNORMAL HIGH (ref 83.0–108.0)

## 2020-04-01 LAB — TYPE AND SCREEN
ABO/RH(D): O POS
Antibody Screen: NEGATIVE
Unit division: 0
Unit division: 0

## 2020-04-01 LAB — BPAM RBC
Blood Product Expiration Date: 202105282359
Blood Product Expiration Date: 202105292359
ISSUE DATE / TIME: 202105061137
ISSUE DATE / TIME: 202105061137
Unit Type and Rh: 5100
Unit Type and Rh: 5100

## 2020-04-01 LAB — POCT ACTIVATED CLOTTING TIME: Activated Clotting Time: 241 seconds

## 2020-04-01 LAB — HEMOGLOBIN AND HEMATOCRIT, BLOOD
HCT: 24 % — ABNORMAL LOW (ref 39.0–52.0)
Hemoglobin: 8.1 g/dL — ABNORMAL LOW (ref 13.0–17.0)

## 2020-04-01 MED ORDER — OXYCODONE HCL 5 MG PO TABS: 5.0000 mg | ORAL_TABLET | Freq: Four times a day (QID) | ORAL | 0 refills | Status: DC | PRN

## 2020-04-01 NOTE — Progress Notes (Addendum)
[facility] [place]  Subjective  - doing well some pain at groin incision , but tolerable.   Objective (!) 123/50 65 98.8 F (37.1 C) (Oral) 18 100%  Intake/Output Summary (Last 24 hours) at 04/01/2020 0744 Last data filed at 04/01/2020 0732 Gross per 24 hour  Intake 4640.1 ml  Output 3700 ml  Net 940.1 ml    Palpable DP/PT B LE Right groin soft with healing incision and no hematoma Heart RRR Lungs non labored breathing  Assessment/Planning: POD # 1 Procedure:   #1: Right external iliac, common femoral, profundofemoral, and superficial femoral endarterectomy with bovine pericardial patch angioplasty                         #2: Stent, right common iliac artery (8 x 19 Herculink)                         #3: Stent, right superficial femoral artery  Post op blood loss anemia.  Transfused 1 unit of PRBC.  Will check H/H prior to discharge. If stable with discharge home today. Stable disposition with palpable pulses B LE F/U with Dr. [name] in 2-3 weeks  Roxy Horseman 04/01/2020 7:44 AM --  Laboratory Lab Results: Recent Labs    03/31/20 1625 03/31/20 2150  WBC 10.6* 7.7  HGB 9.8* 8.7*  HCT 28.7* 26.2*  PLT 191 181   BMET Recent Labs    03/29/20 0852 03/29/20 0852 03/31/20 1625 03/31/20 2150  NA 132*  --   --  135  K 4.3  --   --  4.4  CL 98  --   --  102  CO2 26  --   --  23  GLUCOSE 118*  --   --  164*  BUN 8  --   --  10  CREATININE 0.69   < > 0.68 0.86  CALCIUM 9.1  --   --  8.1*   < > = values in this interval not displayed.    COAG Lab Results  Component Value Date   INR 1.1 03/29/2020   INR 1.0 11/10/2019   INR 1.1 07/22/2019   No results found for: PTT   I agree with the above.  Have seen and evaluate the patient.  He is already ambulated with PT today.  His groin is without hematoma.  He has brisk pedal Doppler signals.  His repeat hemoglobin was adequate today.  He will be discharged home this morning.  I  told him to start his Brilinta tomorrow.  He will follow-up in 2 weeks  Wells Coletta Lockner

## 2020-04-01 NOTE — Evaluation (Signed)
Physical Therapy Evaluation Patient Details Name: Roy [name] MRN: [id number] DOB: 01-Jun-1946 Today's Date: 04/01/2020   History of Present Illness  Pt adm with rt leg claudication. Underwent RLE endarterectomy, angioplasty, and stent x 2 on 03/31/20. PMH - ICD, TIA, chf, copd, rt total shoulder replacement, htn, etoh abuse, PVD, MI,   Clinical Impression  Pt doing well with mobility and no further PT needed.  Ready for dc from PT standpoint.      Follow Up Recommendations No PT follow up    Equipment Recommendations  None recommended by PT    Recommendations for Other Services       Precautions / Restrictions Precautions Precautions: None Restrictions Weight Bearing Restrictions: Yes RLE Weight Bearing: Weight bearing as tolerated      Mobility  Bed Mobility Overal bed mobility: Independent                Transfers Overall transfer level: Independent Equipment used: None             General transfer comment: Steady to rise  Ambulation/Gait Ambulation/Gait assistance: Modified independent (Device/Increase time) Gait Distance (Feet): 200 Feet Assistive device: None Gait Pattern/deviations: Step-through pattern;Decreased stance time - right;Antalgic Gait velocity: decr Gait velocity interpretation: 1.31 - 2.62 ft/sec, indicative of limited community ambulator General Gait Details: Steady gait. Speed limited by soreness  Stairs            Wheelchair Mobility    Modified Rankin (Stroke Patients Only)       Balance Overall balance assessment: No apparent balance deficits (not formally assessed)                                           Pertinent Vitals/Pain Pain Assessment: Faces Faces Pain Scale: Hurts little more Pain Location: RLE Pain Descriptors / Indicators: Burning;Tightness Pain Intervention(s): Limited activity within patient's tolerance;Monitored during session    [place] expects to be  discharged to:: Private residence Living Arrangements: Spouse/significant other;Children Available Help at Discharge: Family;Available 24 hours/day Type of Home: Mobile home Home Access: Stairs to enter Entrance Stairs-Rails: Right Entrance Stairs-Number of Steps: 4 Home Layout: One level Home Equipment: Walker - 2 wheels;Cane - single point;Shower seat;Grab bars - toilet;Grab bars - tub/shower;Hand held shower head      Prior Function Level of Independence: Independent               Hand Dominance   Dominant Hand: Right    Extremity/Trunk Assessment   Upper Extremity Assessment Upper Extremity Assessment: Overall WFL for tasks assessed    Lower Extremity Assessment Lower Extremity Assessment: RLE deficits/detail RLE Deficits / Details: slightly limited by soreness       Communication   Communication: No difficulties  Cognition Arousal/Alertness: Awake/alert Behavior During Therapy: WFL for tasks assessed/performed Overall Cognitive Status: Within Functional Limits for tasks assessed                                        General Comments General comments (skin integrity, edema, etc.): VSS    Exercises     Assessment/Plan    PT Assessment Patent does not need any further PT services  PT Problem List         PT Treatment Interventions      PT Goals (  Current goals can be found in the Care Plan section)  Acute Rehab PT Goals Patient Stated Goal: go home PT Goal Formulation: All assessment and education complete, DC therapy    Frequency     Barriers to discharge        Co-evaluation               AM-PAC PT "6 Clicks" Mobility  Outcome Measure Help needed turning from your back to your side while in a flat bed without using bedrails?: None Help needed moving from lying on your back to sitting on the side of a flat bed without using bedrails?: None Help needed moving to and from a bed to a chair (including a wheelchair)?:  None Help needed standing up from a chair using your arms (e.g., wheelchair or bedside chair)?: None Help needed to walk in hospital room?: None Help needed climbing 3-5 steps with a railing? : None 6 Click Score: 24    End of Session   Activity Tolerance: Patient tolerated treatment well Patient left: in chair;with call bell/phone within reach Nurse Communication: Mobility status PT Visit Diagnosis: Other abnormalities of gait and mobility (R26.89)    Time: [id number] PT Time Calculation (min) (ACUTE ONLY): 11 min   Charges:   PT Evaluation $PT Eval Low Complexity: [place] Pager [phone] Office [place] 04/01/2020, 9:40 AM

## 2020-04-01 NOTE — Progress Notes (Addendum)
Plan of care reviewed. Pt's progressing. He's hemodynamically stable. Remained afebrile, EKG; AV- paced on monitor, HR 59-60s, BP 94/36- 132/49 mmHg, RR 18, SPO2 98% on room air.  Right groin surgical wound attached adges with skin glue dry and clean, negative for hematoma, level 0,  DP and PT pulse 2+palpable and warm touch bilaterally. Pain tolerated well. Oxycodone and Tylenol given PRN. He's able to sleep well tonight. No immediate distress noted. We will continue to monitor.  Kennyth Lose, RN, [place], [place]

## 2020-04-01 NOTE — Discharge Instructions (Signed)
 [facility] [place]  Discharge instructions  Lower Extremity Bypass Surgery  Please refer to the following instruction for your post-procedure care. Your surgeon or physician assistant will discuss any changes with you.  Activity  You are encouraged to walk as much as you can. You can slowly return to normal activities during the month after your surgery. Avoid strenuous activity and heavy lifting until your doctor tells you it's OK. Avoid activities such as vacuuming or swinging a golf club. Do not drive until your doctor give the OK and you are no longer taking prescription pain medications. It is also normal to have difficulty with sleep habits, eating and bowel movement after surgery. These will go away with time.  Bathing/Showering  You may shower after you go home. Do not soak in a bathtub, hot tub, or swim until the incision heals completely.  Incision Care  Clean your incision with mild soap and water. Shower every day. Pat the area dry with a clean towel. You do not need a bandage unless otherwise instructed. Do not apply any ointments or creams to your incision. If you have open wounds you will be instructed how to care for them or a visiting nurse may be arranged for you. If you have staples or sutures along your incision they will be removed at your post-op appointment. You may have skin glue on your incision. Do not peel it off. It will come off on its own in about one week. If you have a great deal of moisture in your groin, use a gauze help keep this area dry.  Diet  Resume your normal diet. There are no special food restrictions following this procedure. A low fat/ low cholesterol diet is recommended for all patients with vascular disease. In order to heal from your surgery, it is CRITICAL to get adequate nutrition. Your body requires vitamins, minerals, and protein. Vegetables are the best source of vitamins and minerals. Vegetables also provide the  perfect balance of protein. Processed food has little nutritional value, so try to avoid this.  Medications  Resume taking all your medications unless your doctor or nurse practitioner tells you not to. If your incision is causing pain, you may take over-the-counter pain relievers such as acetaminophen (Tylenol). If you were prescribed a stronger pain medication, please aware these medication can cause nausea and constipation. Prevent nausea by taking the medication with a snack or meal. Avoid constipation by drinking plenty of fluids and eating foods with high amount of fiber, such as fruits, vegetables, and grains. Take Colase 100 mg (an over-the-counter stool softener) twice a day as needed for constipation. Do not take Tylenol if you are taking prescription pain medications.  Follow Up  Our office will schedule a follow up appointment 2-3 weeks following discharge.  Please call us immediately for any of the following conditions  Severe or worsening pain in your legs or feet while at rest or while walking Increase pain, redness, warmth, or drainage (pus) from your incision site(s) Fever of 101 degree or higher The swelling in your leg with the bypass suddenly worsens and becomes more painful than when you were in the hospital If you have been instructed to feel your graft pulse then you should do so every day. If you can no longer feel this pulse, call the office immediately. Not all patients are given this instruction.  Leg swelling is common after leg bypass surgery.  The swelling should improve over a few months   following surgery. To improve the swelling, you may elevate your legs above the level of your heart while you are sitting or resting. Your surgeon or physician assistant may ask you to apply an ACE wrap or wear compression (TED) stockings to help to reduce swelling.  Reduce your risk of vascular disease  Stop smoking. If you would like help call [facility] at 1-800-QUIT-NOW  (1-800-784-8669) or [place] at 336-586-4000.  Manage your cholesterol Maintain a desired weight Control your diabetes weight Control your diabetes Keep your blood pressure down  If you have any questions, please call the office at [phone]   

## 2020-04-01 NOTE — Anesthesia Postprocedure Evaluation (Signed)
Anesthesia Post Note  Patient: Roy [name]  Procedure(s) Performed: RIGHT FEMORAL ENDARTERECTOMY (Right ) PATCH ANGIOPLASTY USING XENOSURE BIOLOGIC PATCH 1cm x 14cm (Right Groin) INSERTION OF RIGHT ILIAC ARTERY STENT AND RIGHT SUPERFICIAL FEMORAL ARTERY STENT     Patient location during evaluation: PACU Anesthesia Type: General Level of consciousness: awake and alert Pain management: pain level controlled Vital Signs Assessment: post-procedure vital signs reviewed and stable Respiratory status: spontaneous breathing, nonlabored ventilation, respiratory function stable and patient connected to nasal cannula oxygen Cardiovascular status: blood pressure returned to baseline and stable Postop Assessment: no apparent nausea or vomiting Anesthetic complications: no    Last Vitals:  Vitals:   04/01/20 0600 04/01/20 0730  BP: (!) 114/50 (!) 123/50  Pulse: 63 65  Resp:  18  Temp:  37.1 C  SpO2: 98% 100%    Last Pain:  Vitals:   04/01/20 0800  TempSrc:   PainSc: 0-No pain   Pain Goal:                   Roy [name] S

## 2020-04-01 NOTE — Evaluation (Signed)
Occupational Therapy Evaluation Patient Details Name: Roy [name] MRN: [id number] DOB: [birth year] Today's Date: 04/01/2020    History of Present Illness Pt adm with rt leg claudication. Underwent RLE endarterectomy, angioplasty, and stent x 2 on 03/31/20. PMH - ICD, TIA, chf, copd, rt total shoulder replacement, htn, etoh abuse, PVD, MI,    Clinical Impression   Pt is at independent baseline level of function, moving well and eager to go home. All education completed and no further acute OT is indicated at this time    Follow Up Recommendations  No OT follow up    Equipment Recommendations  None recommended by OT    Recommendations for Other Services       Precautions / Restrictions Precautions Precautions: None Restrictions Weight Bearing Restrictions: Yes RLE Weight Bearing: Weight bearing as tolerated      Mobility Bed Mobility Overal bed mobility: Independent                Transfers Overall transfer level: Independent Equipment used: None             General transfer comment: Steady to rise    Balance Overall balance assessment: No apparent balance deficits (not formally assessed)                                         ADL either performed or assessed with clinical judgement   ADL Overall ADL's : Independent;At baseline                                             Vision Patient Visual Report: No change from baseline       Perception     Praxis      Pertinent Vitals/Pain Pain Assessment: 0-10 Pain Score: 2  Faces Pain Scale: Hurts little more Pain Location: RLE Pain Descriptors / Indicators: Burning;Tightness Pain Intervention(s): Monitored during session     Hand Dominance Right   Extremity/Trunk Assessment Upper Extremity Assessment Upper Extremity Assessment: Overall WFL for tasks assessed   Lower Extremity Assessment Lower Extremity Assessment: RLE deficits/detail RLE Deficits /  Details: slightly limited by soreness       Communication Communication Communication: No difficulties   Cognition Arousal/Alertness: Awake/alert Behavior During Therapy: WFL for tasks assessed/performed Overall Cognitive Status: Within Functional Limits for tasks assessed                                     General Comments  VSS    Exercises     Shoulder Instructions      Home Living Family/patient expects to be discharged to:: Private residence Living Arrangements: Spouse/significant other Available Help at Discharge: Family;Available 24 hours/day Type of Home: Mobile home Home Access: Stairs to enter Entrance Stairs-Number of Steps: 4 Entrance Stairs-Rails: Right Home Layout: One level     Bathroom Shower/Tub: Teacher, early years/pre: Handicapped height     Home Equipment: Environmental consultant - 2 wheels;Cane - single point;Shower seat;Grab bars - toilet;Grab bars - tub/shower;Hand held shower head          Prior Functioning/Environment Level of Independence: Independent  OT Problem List: Decreased activity tolerance      OT Treatment/Interventions:      OT Goals(Current goals can be found in the care plan section) Acute Rehab OT Goals Patient Stated Goal: go home  OT Frequency:     Barriers to D/C:            Co-evaluation              AM-PAC OT "6 Clicks" Daily Activity     Outcome Measure Help from another person eating meals?: None Help from another person taking care of personal grooming?: None Help from another person toileting, which includes using toliet, bedpan, or urinal?: None Help from another person bathing (including washing, rinsing, drying)?: None Help from another person to put on and taking off regular upper body clothing?: None Help from another person to put on and taking off regular lower body clothing?: None 6 Click Score: 24   End of Session    Activity Tolerance: Patient tolerated  treatment well Patient left: in chair                   Time: 1014-1030 OT Time Calculation (min): 16 min Charges:  OT General Charges $OT Visit: 1 Visit    Britt Bottom 04/01/2020, 11:41 AM

## 2020-04-05 ENCOUNTER — Telehealth: Payer: Self-pay

## 2020-04-05 ENCOUNTER — Other Ambulatory Visit: Payer: Self-pay

## 2020-04-05 DIAGNOSIS — T8149XA Infection following a procedure, other surgical site, initial encounter: Secondary | ICD-10-CM

## 2020-04-05 MED ORDER — CEPHALEXIN 500 MG PO CAPS: 500.0000 mg | ORAL_CAPSULE | Freq: Two times a day (BID) | ORAL | 0 refills | Status: AC

## 2020-04-05 NOTE — Telephone Encounter (Signed)
Home health nurse with [facility] called regarding pt's groin incision looking red, hot and swollen. Per PA pt will be ordered Keflex 500 mg BID x7 days and is now scheduled to see PA tomorrow. Pt is aware and verbalized understanding.

## 2020-04-05 NOTE — Discharge Summary (Signed)
[facility] Discharge Summary   Patient ID:  Roy [name] MRN: [id number] DOB/AGE: [birth year] 73 y.o.  Admit date: 03/31/2020 Discharge date:04/01/20 Date of Surgery: 03/31/2020 Surgeon: Surgeon(s): [name], MD  Admission Diagnosis: Peripheral vascular disease [facility]) [I73.9]  Discharge Diagnoses:  Peripheral vascular disease ([place]) [I73.9]  Secondary Diagnoses: Past Medical History:  Diagnosis Date  . AICD (automatic cardioverter/defibrillator) present 08/19/2015   St Jude BiV ICD for primary prevention by Dr. Lovena Le  . Alcohol abuse    6 beers per day; hospital admission in 2009 for withdrawal symptoms  . Alcoholic cirrhosis ([place])   . Anxiety and depression    denies   . Cerebrovascular disease 2009   TIA; 2009- right ICA stent; re-intervention for restenosis complicated by [facility] w/o sx  . CHF (congestive heart failure) ([place]) 06-02-14  . Chronic obstructive pulmonary disease ([place])   . Degenerative joint disease    Total shoulder arthroplasty-right  . Hyperlipidemia   . Hypertension   . LBBB (left bundle branch block)    Normal echo-2011; stress nuclear in 09/2010--septal hypoperfusion representing nontransmural infarction or the effect of left bundle branch block, no ischemia  . Myocardial infarction [facility]) June 02, 2014   Massive Heart Attack  . Peripheral vascular disease ([place])   . 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  . Thrombocytopenia ([place])   . Tobacco abuse    -100 pack years; 1.5 packs per day  . Traumatic seroma of thigh ([place])    left  . Tubular adenoma of colon     Procedure(s): RIGHT FEMORAL ENDARTERECTOMY PATCH ANGIOPLASTY USING XENOSURE BIOLOGIC PATCH 1cm x 14cm INSERTION OF RIGHT ILIAC ARTERY STENT AND RIGHT SUPERFICIAL FEMORAL ARTERY STENT  Discharged Condition: good  HPI: This is a 74 year old gentleman who returns today for follow-up of his claudication symptoms. He is status post bilateral iliac stenting in 2015.  This was done for lifestyle limiting claudication. An 8 x 40 self extending stent was placed on the left and 8 x 80 on the right. He developed progressive symptoms and on09/21/2018 he underwent a left femoral to above-knee popliteal artery bypass graft with ipsilateral non-reversed greater saphenous vein as well as the extensive profunda femoral endarterectomy. He did well initially however developed a fluid collection around the distal incision, and went back for I and D on 09/03/2017 with wound VAC placement. He began having significant right leg symptoms and on 04/22/2018 he underwent atherectomy and drug-coated balloon angioplasty of the right superficial femoral artery as well as angioplasty of the right external iliac artery.    He recently underwent angiography for recurrent symptoms.  Tandem near occlusive lesions were seen in the right superficial femoral artery and treated with primary stenting using 7 x 120 Elluvia stents.  He also had a 70% common femoral stenosis that was treated with a drug-coated balloon.  He is back today to discuss right common femoral endarterectomy and patch angioplasty with simultaneous stenting of his proximal right common iliac artery stenosis which is approximately 50%.  He has had significant improvement in his right leg symptoms and right hip symptoms.  Hospital Course:  Roy [name] is a 74 y.o. male is S/P Procedure(s): RIGHT FEMORAL ENDARTERECTOMY PATCH ANGIOPLASTY USING XENOSURE BIOLOGIC PATCH 1cm x 14cm INSERTION OF RIGHT ILIAC ARTERY STENT AND RIGHT SUPERFICIAL FEMORAL ARTERY STENT Post op day 1 stable with Palpable DP/PT B LE Post op blood loss anemia.  Transfused 1 unit of PRBC.  Will check H/H prior to discharge.  If stable with discharge home today.  F/U with Dr. [name] in 2-3 weeks  Significant Diagnostic Studies: CBC Lab Results  Component Value Date   WBC 7.7 03/31/2020   HGB 8.1 (L) 04/01/2020   HCT 24.0 (L) 04/01/2020   MCV 93.2  03/31/2020   PLT 181 03/31/2020    BMET    Component Value Date/Time   NA 135 03/31/2020 2150   NA 135 (A) 04/16/2012 0807   K 4.4 03/31/2020 2150   K 4.2 04/16/2012 0807   CL 102 03/31/2020 2150   CL 99 04/16/2012 0807   CO2 23 03/31/2020 2150   CO2 26 04/16/2012 0807   GLUCOSE 164 (H) 03/31/2020 2150   BUN 10 03/31/2020 2150   BUN 6 04/16/2012 0807   CREATININE 0.86 03/31/2020 2150   CREATININE 0.67 (L) 11/10/2019 0839   CALCIUM 8.1 (L) 03/31/2020 2150   CALCIUM 9.0 04/16/2012 0807   GFRNONAA >60 03/31/2020 2150   GFRNONAA >89 03/03/2014 0957   GFRAA >60 03/31/2020 2150   GFRAA >89 03/03/2014 0957   COAG Lab Results  Component Value Date   INR 1.1 03/29/2020   INR 1.0 11/10/2019   INR 1.1 07/22/2019     Disposition:  Discharge to :Home Discharge Instructions    Call MD for:  redness, tenderness, or signs of infection (pain, swelling, bleeding, redness, odor or green/yellow discharge around incision site)   Complete by: As directed    Call MD for:  severe or increased pain, loss or decreased feeling  in affected limb(s)   Complete by: As directed    Call MD for:  temperature >100.5   Complete by: As directed    Discharge instructions   Complete by: As directed    If HGB stable and ambulates without symptoms he can be discharged home   Resume previous diet   Complete by: As directed      Allergies as of 04/01/2020   No Known Allergies     Medication List    STOP taking these medications   traMADol 50 MG tablet Commonly known as: ULTRAM     TAKE these medications   acetaminophen 500 MG tablet Commonly known as: TYLENOL Take 1,000 mg by mouth 2 (two) times daily.   aspirin EC 81 MG tablet Take 81 mg by mouth every evening.   atorvastatin 20 MG tablet Commonly known as: LIPITOR Take 20 mg by mouth daily.   Brilinta 90 MG Tabs tablet Generic drug: ticagrelor Take 90 mg by mouth 2 (two) times daily.   carvedilol 3.125 MG tablet Commonly known  as: COREG TAKE 1 TABLET TWICE A DAY What changed:   how much to take  how to take this  when to take this  additional instructions   CENTRUM SILVER PO Take 1 tablet by mouth daily. 50+   dextromethorphan-guaiFENesin 30-600 MG 12hr tablet Commonly known as: MUCINEX DM Take 1 tablet by mouth daily.   Entresto 49-51 MG Generic drug: sacubitril-valsartan TAKE 1 TABLET TWICE A DAY   gabapentin 300 MG capsule Commonly known as: NEURONTIN Take 300 mg by mouth 2 (two) times daily.   magnesium oxide 400 MG tablet Commonly known as: MAG-OX Take 400 mg by mouth daily.   omega-3 acid ethyl esters 1 g capsule Commonly known as: LOVAZA Take 1 g by mouth daily.   oxyCODONE 5 MG immediate release tablet Commonly known as: Oxy IR/ROXICODONE Take 1 tablet (5 mg total) by mouth every 6 (six) hours as needed for  moderate pain.   SUPER B COMPLEX/C PO Take 1 tablet by mouth at bedtime.   tamsulosin 0.4 MG Caps capsule Commonly known as: FLOMAX Take 0.4 mg by mouth 2 (two) times daily.      Verbal and written Discharge instructions given to the patient. Wound care per Discharge AVS Follow-up Information    [name], MD Follow up in 2 week(s).   Specialties: Vascular Surgery, Cardiology Why: message sent to the office Contact information: [place] [address] [place], [facility] Follow up.   Specialty: [place] Why: Pre-op referral from vascular office for any [facility] needs- they will f/u with you post discharge Contact information: [place] [phone]           Signed: Roxy Horseman 04/05/2020, 8:39 AM - For VQI Registry use --- Instructions: Press F2 to tab through selections.  Delete question if not applicable.   Post-op:  Wound infection: No  Graft infection: No  Transfusion: Yes  If yes, 1 units given New Arrhythmia: No Ipsilateral amputation: [x ] no, [ ]  Minor,  [ ]  BKA, [ ]  AKA Discharge patency: [ ]  Primary, [x ] Primary assisted, [ ]  Secondary, [ ]  Occluded Patency judged by: [ ]  Dopper only, [ ]  Palpable graft pulse, [x ] Palpable distal pulse, [ ]  ABI inc. > 0.15, [ ]  Duplex D/C Ambulatory Status: Ambulatory  Complications: MI: [x ] No, [ ]  Troponin only, [ ]  EKG or Clinical CHF: No Resp failure: [x ] none, [ ]  Pneumonia, [ ]  Ventilator Chg in renal function: [x ] none, [ ]  Inc. Cr > 0.5, [ ]  Temp. Dialysis, [ ]  Permanent dialysis Stroke: x[ ]  None, [ ]  Minor, [ ]  Major Return to OR: No  Reason for return to OR: [ ]  Bleeding, [ ]  Infection, [ ]  Thrombosis, [ ]  Revision  Discharge medications: Statin use:  Yes ASA use:  Yes Plavix use:  No  for medical reason   Beta blocker use: No  for medical reason   Coumadin use: No  for medical reason Brilinta

## 2020-04-06 ENCOUNTER — Ambulatory Visit (INDEPENDENT_AMBULATORY_CARE_PROVIDER_SITE_OTHER): Payer: Medicare Other | Admitting: *Deleted

## 2020-04-06 ENCOUNTER — Ambulatory Visit (INDEPENDENT_AMBULATORY_CARE_PROVIDER_SITE_OTHER): Payer: Self-pay | Admitting: Physician Assistant

## 2020-04-06 ENCOUNTER — Other Ambulatory Visit: Payer: Self-pay

## 2020-04-06 ENCOUNTER — Other Ambulatory Visit: Payer: Self-pay | Admitting: Physician Assistant

## 2020-04-06 ENCOUNTER — Inpatient Hospital Stay (HOSPITAL_COMMUNITY)
Admission: AD | Admit: 2020-04-06 | Discharge: 2020-04-08 | DRG: 857 | Disposition: A | Discharge status: Home Health | Payer: Medicare Other | Source: Ambulatory Visit | Attending: Surgery | Admitting: Surgery

## 2020-04-06 ENCOUNTER — Encounter (HOSPITAL_COMMUNITY): Payer: Self-pay | Admitting: Surgery

## 2020-04-06 VITALS — BP 153/69 | HR 71 | Temp 97.9°F | Resp 20 | Ht 63.0 in | Wt 151.0 lb

## 2020-04-06 DIAGNOSIS — E871 Hypo-osmolality and hyponatremia: Secondary | ICD-10-CM | POA: Diagnosis not present

## 2020-04-06 DIAGNOSIS — Z95820 Peripheral vascular angioplasty status with implants and grafts: Secondary | ICD-10-CM

## 2020-04-06 DIAGNOSIS — I447 Left bundle-branch block, unspecified: Secondary | ICD-10-CM | POA: Diagnosis not present

## 2020-04-06 DIAGNOSIS — F17210 Nicotine dependence, cigarettes, uncomplicated: Secondary | ICD-10-CM | POA: Diagnosis not present

## 2020-04-06 DIAGNOSIS — Z79899 Other long term (current) drug therapy: Secondary | ICD-10-CM

## 2020-04-06 DIAGNOSIS — Y838 Other surgical procedures as the cause of abnormal reaction of the patient, or of later complication, without mention of misadventure at the time of the procedure: Secondary | ICD-10-CM | POA: Diagnosis present

## 2020-04-06 DIAGNOSIS — I255 Ischemic cardiomyopathy: Secondary | ICD-10-CM

## 2020-04-06 DIAGNOSIS — I252 Old myocardial infarction: Secondary | ICD-10-CM | POA: Diagnosis not present

## 2020-04-06 DIAGNOSIS — E876 Hypokalemia: Secondary | ICD-10-CM | POA: Diagnosis not present

## 2020-04-06 DIAGNOSIS — M1990 Unspecified osteoarthritis, unspecified site: Secondary | ICD-10-CM | POA: Diagnosis present

## 2020-04-06 DIAGNOSIS — T8142XA Infection following a procedure, deep incisional surgical site, initial encounter: Secondary | ICD-10-CM

## 2020-04-06 DIAGNOSIS — E785 Hyperlipidemia, unspecified: Secondary | ICD-10-CM | POA: Diagnosis present

## 2020-04-06 DIAGNOSIS — Z7982 Long term (current) use of aspirin: Secondary | ICD-10-CM

## 2020-04-06 DIAGNOSIS — L0390 Cellulitis, unspecified: Secondary | ICD-10-CM | POA: Diagnosis not present

## 2020-04-06 DIAGNOSIS — F419 Anxiety disorder, unspecified: Secondary | ICD-10-CM | POA: Diagnosis present

## 2020-04-06 DIAGNOSIS — Z8673 Personal history of transient ischemic attack (TIA), and cerebral infarction without residual deficits: Secondary | ICD-10-CM | POA: Diagnosis not present

## 2020-04-06 DIAGNOSIS — T8149XA Infection following a procedure, other surgical site, initial encounter: Principal | ICD-10-CM | POA: Diagnosis present

## 2020-04-06 DIAGNOSIS — I739 Peripheral vascular disease, unspecified: Secondary | ICD-10-CM | POA: Diagnosis present

## 2020-04-06 DIAGNOSIS — I10 Essential (primary) hypertension: Secondary | ICD-10-CM | POA: Diagnosis present

## 2020-04-06 DIAGNOSIS — Z95810 Presence of automatic (implantable) cardiac defibrillator: Secondary | ICD-10-CM

## 2020-04-06 DIAGNOSIS — Z7902 Long term (current) use of antithrombotics/antiplatelets: Secondary | ICD-10-CM | POA: Diagnosis not present

## 2020-04-06 DIAGNOSIS — I9789 Other postprocedural complications and disorders of the circulatory system, not elsewhere classified: Secondary | ICD-10-CM | POA: Diagnosis not present

## 2020-04-06 DIAGNOSIS — I2510 Atherosclerotic heart disease of native coronary artery without angina pectoris: Secondary | ICD-10-CM | POA: Diagnosis present

## 2020-04-06 DIAGNOSIS — J449 Chronic obstructive pulmonary disease, unspecified: Secondary | ICD-10-CM | POA: Diagnosis present

## 2020-04-06 DIAGNOSIS — F329 Major depressive disorder, single episode, unspecified: Secondary | ICD-10-CM | POA: Diagnosis present

## 2020-04-06 DIAGNOSIS — Z20822 Contact with and (suspected) exposure to covid-19: Secondary | ICD-10-CM | POA: Diagnosis present

## 2020-04-06 DIAGNOSIS — I214 Non-ST elevation (NSTEMI) myocardial infarction: Secondary | ICD-10-CM | POA: Diagnosis not present

## 2020-04-06 LAB — CUP PACEART REMOTE DEVICE CHECK
Battery Remaining Longevity: 32 mo
Battery Remaining Percentage: 40 %
Battery Voltage: 2.9 V
Brady Statistic AP VP Percent: 27 %
Brady Statistic AP VS Percent: 1 %
Brady Statistic AS VP Percent: 71 %
Brady Statistic AS VS Percent: 1 %
Brady Statistic RA Percent Paced: 27 %
Date Time Interrogation Session: 20210512020018
HighPow Impedance: 74 Ohm
HighPow Impedance: 74 Ohm
Implantable Lead Implant Date: 20160923
Implantable Lead Implant Date: 20160923
Implantable Lead Implant Date: 20160923
Implantable Lead Location: 753858
Implantable Lead Location: 753859
Implantable Lead Location: 753860
Implantable Lead Model: 7122
Implantable Pulse Generator Implant Date: 20160923
Lead Channel Impedance Value: 390 Ohm
Lead Channel Impedance Value: 450 Ohm
Lead Channel Impedance Value: 730 Ohm
Lead Channel Pacing Threshold Amplitude: 0.75 V
Lead Channel Pacing Threshold Amplitude: 0.875 V
Lead Channel Pacing Threshold Amplitude: 1 V
Lead Channel Pacing Threshold Pulse Width: 0.5 ms
Lead Channel Pacing Threshold Pulse Width: 0.5 ms
Lead Channel Pacing Threshold Pulse Width: 0.8 ms
Lead Channel Sensing Intrinsic Amplitude: 12 mV
Lead Channel Sensing Intrinsic Amplitude: 4.4 mV
Lead Channel Setting Pacing Amplitude: 2 V
Lead Channel Setting Pacing Amplitude: 2 V
Lead Channel Setting Pacing Amplitude: 2.25 V
Lead Channel Setting Pacing Pulse Width: 0.5 ms
Lead Channel Setting Pacing Pulse Width: 0.8 ms
Lead Channel Setting Sensing Sensitivity: 0.5 mV
Pulse Gen Serial Number: 7294918

## 2020-04-06 LAB — CBC
HCT: 27.7 % — ABNORMAL LOW (ref 39.0–52.0)
Hemoglobin: 9.2 g/dL — ABNORMAL LOW (ref 13.0–17.0)
MCH: 30.9 pg (ref 26.0–34.0)
MCHC: 33.2 g/dL (ref 30.0–36.0)
MCV: 93 fL (ref 80.0–100.0)
Platelets: 291 10*3/uL (ref 150–400)
RBC: 2.98 MIL/uL — ABNORMAL LOW (ref 4.22–5.81)
RDW: 12.1 % (ref 11.5–15.5)
WBC: 8.2 10*3/uL (ref 4.0–10.5)
nRBC: 0 % (ref 0.0–0.2)

## 2020-04-06 LAB — SURGICAL PCR SCREEN
MRSA, PCR: NEGATIVE
Staphylococcus aureus: NEGATIVE

## 2020-04-06 LAB — COMPREHENSIVE METABOLIC PANEL
ALT: 11 U/L (ref 0–44)
AST: 17 U/L (ref 15–41)
Albumin: 3.2 g/dL — ABNORMAL LOW (ref 3.5–5.0)
Alkaline Phosphatase: 61 U/L (ref 38–126)
Anion gap: 12 (ref 5–15)
BUN: 8 mg/dL (ref 8–23)
CO2: 23 mmol/L (ref 22–32)
Calcium: 8.5 mg/dL — ABNORMAL LOW (ref 8.9–10.3)
Chloride: 93 mmol/L — ABNORMAL LOW (ref 98–111)
Creatinine, Ser: 0.73 mg/dL (ref 0.61–1.24)
GFR calc Af Amer: >60 mL/min (ref >60)
GFR calc non Af Amer: >60 mL/min (ref >60)
Glucose, Bld: 113 mg/dL — ABNORMAL HIGH (ref 70–99)
Potassium: 4.3 mmol/L (ref 3.5–5.1)
Sodium: 128 mmol/L — ABNORMAL LOW (ref 135–145)
Total Bilirubin: 0.3 mg/dL (ref 0.3–1.2)
Total Protein: 6.4 g/dL — ABNORMAL LOW (ref 6.5–8.1)

## 2020-04-06 LAB — URINALYSIS, ROUTINE W REFLEX MICROSCOPIC
Bilirubin Urine: NEGATIVE
Glucose, UA: NEGATIVE mg/dL
Hgb urine dipstick: NEGATIVE
Ketones, ur: NEGATIVE mg/dL
Leukocytes,Ua: NEGATIVE
Nitrite: NEGATIVE
Protein, ur: NEGATIVE mg/dL
Specific Gravity, Urine: 1.008 (ref 1.005–1.030)
pH: 7 (ref 5.0–8.0)

## 2020-04-06 LAB — PROTIME-INR
INR: 1.1 (ref 0.8–1.2)
Prothrombin Time: 13.5 seconds (ref 11.4–15.2)

## 2020-04-06 LAB — SARS CORONAVIRUS 2 BY RT PCR (HOSPITAL ORDER, PERFORMED IN ~~LOC~~ HOSPITAL LAB): SARS Coronavirus 2: NEGATIVE

## 2020-04-06 MED ORDER — ALUM & MAG HYDROXIDE-SIMETH 200-200-20 MG/5ML PO SUSP: 15.0000 mL | ORAL | Status: DC | PRN

## 2020-04-06 MED ORDER — POTASSIUM CHLORIDE CRYS ER 20 MEQ PO TBCR
20.0000 meq | EXTENDED_RELEASE_TABLET | Freq: Once | ORAL | Status: DC
  Filled 2020-04-06: qty 1

## 2020-04-06 MED ORDER — METOPROLOL TARTRATE 5 MG/5ML IV SOLN: 2.0000 mg | INTRAVENOUS | Status: DC | PRN

## 2020-04-06 MED ORDER — ONDANSETRON HCL 4 MG/2ML IJ SOLN: 4.0000 mg | Freq: Four times a day (QID) | INTRAMUSCULAR | Status: DC | PRN

## 2020-04-06 MED ORDER — PANTOPRAZOLE SODIUM 40 MG PO TBEC
40.0000 mg | DELAYED_RELEASE_TABLET | Freq: Every day | ORAL | Status: DC
  Administered 2020-04-07 – 2020-04-08 (×2): 40 mg via ORAL
  Filled 2020-04-06 (×2): qty 1

## 2020-04-06 MED ORDER — GUAIFENESIN-DM 100-10 MG/5ML PO SYRP: 15.0000 mL | ORAL_SOLUTION | ORAL | Status: DC | PRN

## 2020-04-06 MED ORDER — OXYCODONE-ACETAMINOPHEN 5-325 MG PO TABS: 1.0000 | ORAL_TABLET | ORAL | Status: DC | PRN

## 2020-04-06 MED ORDER — LABETALOL HCL 5 MG/ML IV SOLN: 10.0000 mg | INTRAVENOUS | Status: DC | PRN

## 2020-04-06 MED ORDER — PIPERACILLIN-TAZOBACTAM 3.375 G IVPB
3.3750 g | Freq: Three times a day (TID) | INTRAVENOUS | Status: DC
  Administered 2020-04-06 – 2020-04-08 (×6): 3.375 g via INTRAVENOUS
  Filled 2020-04-06 (×6): qty 50

## 2020-04-06 MED ORDER — VANCOMYCIN HCL 1250 MG/250ML IV SOLN
1250.0000 mg | Freq: Once | INTRAVENOUS | Status: AC
  Administered 2020-04-06: 1250 mg via INTRAVENOUS
  Filled 2020-04-06: qty 250

## 2020-04-06 MED ORDER — HEPARIN SODIUM (PORCINE) 5000 UNIT/ML IJ SOLN
5000.0000 [IU] | Freq: Three times a day (TID) | INTRAMUSCULAR | Status: DC
  Administered 2020-04-06 – 2020-04-07 (×2): 5000 [IU] via SUBCUTANEOUS
  Filled 2020-04-06 (×2): qty 1

## 2020-04-06 MED ORDER — ACETAMINOPHEN 325 MG PO TABS: 325.0000 mg | ORAL_TABLET | ORAL | Status: DC | PRN

## 2020-04-06 MED ORDER — MUPIROCIN 2 % EX OINT
1.0000 "application " | TOPICAL_OINTMENT | Freq: Two times a day (BID) | CUTANEOUS | Status: DC
  Administered 2020-04-06: 1 via NASAL
  Filled 2020-04-06: qty 22

## 2020-04-06 MED ORDER — VANCOMYCIN HCL 750 MG/150ML IV SOLN
750.0000 mg | Freq: Two times a day (BID) | INTRAVENOUS | Status: DC
  Administered 2020-04-07 – 2020-04-08 (×3): 750 mg via INTRAVENOUS
  Filled 2020-04-06 (×4): qty 150

## 2020-04-06 MED ORDER — HYDRALAZINE HCL 20 MG/ML IJ SOLN: 5.0000 mg | INTRAMUSCULAR | Status: DC | PRN

## 2020-04-06 MED ORDER — PHENOL 1.4 % MT LIQD: 1.0000 | OROMUCOSAL | Status: DC | PRN

## 2020-04-06 MED ORDER — MORPHINE SULFATE (PF) 2 MG/ML IV SOLN: 2.0000 mg | INTRAVENOUS | Status: DC | PRN

## 2020-04-06 MED ORDER — ACETAMINOPHEN 650 MG RE SUPP: 325.0000 mg | RECTAL | Status: DC | PRN

## 2020-04-06 NOTE — Progress Notes (Addendum)
Pharmacy Antibiotic Note  Roy [name] is a 74 y.o. male admitted on 04/06/2020 with wound infection.  Pt is S/P the following procedures by Vascular Surgery on 03/31/20: #1: Right external iliac, common femoral, profundofemoral, and superficial femoral endarterectomy with bovine patch angioplasty #2: Stent, right common iliac artery (8 x 19 Herculink), and  #3: Stent, right superficial femoral artery. Pt developed erythema and warmth at R groin incision. Vascular is planning I&D in OR tomorrow. Pt is receiving Zosyn 3.375 gm IV Q 8 hrs. Pharmacy has been consulted for  vancomycin dosing.  WBC 8.2, afebrile; Scr 0.73, CrCl 71.5 ml/min (renal function stable from recent admission)  Plan: Vancomycin 1250 mg IV X 1, followed by vancomycin 750 mg IV Q 12 hrs (goal vancomycin trough: 15-20 mg/L) Monitor WBC, temp, clinical improvement, renal function, vancomycin trough as indicated   Temp (24hrs), Avg:97.9 F (36.6 C), Min:97.9 F (36.6 C), Max:97.9 F (36.6 C)  Recent Labs  Lab 03/31/20 1625 03/31/20 2150  WBC 10.6* 7.7  CREATININE 0.68 0.86    Estimated Creatinine Clearance: 66.5 mL/min (by C-G formula based on SCr of 0.86 mg/dL).    No Known Allergies  Antimicrobials this admission: 5/12 Zosyn >>  5/12 Vancomycin >>   Microbiology results: 5/4 MRSA PCR: negative 5/5 COVID: negative  Thank you for allowing pharmacy to be a part of this patient's care.  Gillermina Hu, PharmD, BCPS, [facility] Clinical Pharmacist 04/06/2020 3:44 PM

## 2020-04-06 NOTE — Progress Notes (Addendum)
POST OPERATIVE OFFICE NOTE    CC:  Check right groin incision  HPI:  This is a 74 y.o. male who is s/p the following procedures by Dr. [name] on 03/31/2020 #1: Right external iliac, common femoral, profundofemoral, and superficial femoral endarterectomy with bovine pericardial patch angioplasty #2: Stent, right common iliac artery (8 x 19 Herculink)  #3: Stent, right superficial femoral artery  He called the office yesterday complaining of redness and increased warmth at his right groin incision. He was prescribed Keflex 300mg  BID.  He states he has had the feeling of being cold since discharged from the hospital but denies shaking chills.  His daughter accompanies him to the visit today and states she has been taking his temperature and he has not been febrile.  He denies lower extremity pain.   His history was significant for lifestyle limiting claudication.  This has resolved. Past surgical procedures include left fem-pop bypass, AICD and left vertebral artery revascularization/stent (Dr. Estanislado Pandy)  Maintained on aspirin, statin and Brilinta One ppd cigarette smoker  No Known Allergies  Current Outpatient Medications  Medication Sig Dispense Refill  . acetaminophen (TYLENOL) 500 MG tablet Take 1,000 mg by mouth 2 (two) times daily.     [name] aspirin EC 81 MG tablet Take 81 mg by mouth every evening.     [name] atorvastatin (LIPITOR) 20 MG tablet Take 20 mg by mouth daily.     [name] BRILINTA 90 MG TABS tablet Take 90 mg by mouth 2 (two) times daily.    . carvedilol (COREG) 3.125 MG tablet TAKE 1 TABLET TWICE A DAY (Patient taking differently: Take 3.125 mg by mouth 2 (two) times daily with a meal. ) 180 tablet 0  . cephALEXin (KEFLEX) 500 MG capsule Take 1 capsule (500 mg total) by mouth 2 (two) times daily for 7 days. 14 capsule 0  . dextromethorphan-guaiFENesin (MUCINEX DM) 30-600 MG 12hr tablet Take 1 tablet by mouth daily.     [name] ENTRESTO 49-51 MG TAKE 1 TABLET TWICE A DAY (Patient taking  differently: Take 1 tablet by mouth 2 (two) times daily. ) 180 tablet 4  . gabapentin (NEURONTIN) 300 MG capsule Take 300 mg by mouth 2 (two) times daily.     . magnesium oxide (MAG-OX) 400 MG tablet Take 400 mg by mouth daily.    . Multiple Vitamins-Minerals (CENTRUM SILVER PO) Take 1 tablet by mouth daily. 50+    . omega-3 acid ethyl esters (LOVAZA) 1 g capsule Take 1 g by mouth daily.     [name] oxyCODONE (OXY IR/ROXICODONE) 5 MG immediate release tablet Take 1 tablet (5 mg total) by mouth every 6 (six) hours as needed for moderate pain. 20 tablet 0  . SUPER B COMPLEX/C PO Take 1 tablet by mouth at bedtime.     . tamsulosin (FLOMAX) 0.4 MG CAPS capsule Take 0.4 mg by mouth 2 (two) times daily.      No current facility-administered medications for this visit.     ROS:  See HPI  Physical Exam:  Vitals:   04/06/20 1049  Weight: 151 lb (68.5 kg)  Height: 5\' 3"  (1.6 m)   General appearance: The patient is a well-developed well-nourished male in no apparent distress.  He is nontoxic appearing Cardiac: The heart rate and rhythm are regular Respiratory: Nonlabored, clear to auscultation bilaterally Incision: Right groin incision is well approximated.  There is significant erythema andswelling of the right groin extending to the medial thigh. Extremities: The patient has generalized mild  edema of the right lower extremity.  He has palpable 2+ dorsalis pedis pulses bilaterally. Neuro: Alert and oriented x4   Right groin      Assessment/Plan:  This is a 74 y.o. male who is s/p: Right common femoral, profundo- femoral, and SFA endarterectomy with bovine patch angioplasty 6 days ago.  Most likely infected hematoma.  Discussed with Dr. [name].  We recommend hospitalization with IV antibiotics and incision and drainage in the operating room tomorrow.  I discussed this plan with the patient and his daughter.  They are in agreement.  We will hold Brilinta.   Risa Grill, PA-C [facility] [phone]

## 2020-04-06 NOTE — H&P (View-Only) (Signed)
    Subjective  -   The patient is recently status post right femoral endarterectomy with bovine patch angioplasty and stent placement.  He presented to the office today with erythema on his incision and was admitted.   Physical Exam:  Palpable pedal pulses on the right.  His incision is intact however it is cellulitic.       Assessment/Plan:    We discussed going to the operating room tomorrow for right groin exploration.  He may require removal of the bovine patch and placement of the vein patch.  This was discussed with the patient and he is in agreement.  Wells Anisha Starliper 04/06/2020 9:59 PM --  Vitals:   04/06/20 1605 04/06/20 2026  BP: (!) 135/59 (!) 148/55  Pulse: 72 77  Resp: 13 20  Temp: 98.3 F (36.8 C) 97.6 F (36.4 C)  SpO2: 98% 96%    Intake/Output Summary (Last 24 hours) at 04/06/2020 2159 Last data filed at 04/06/2020 2100 Gross per 24 hour  Intake --  Output 250 ml  Net -250 ml     Laboratory CBC    Component Value Date/Time   WBC 8.2 04/06/2020 1605   HGB 9.2 (L) 04/06/2020 1605   HCT 27.7 (L) 04/06/2020 1605   HCT 43 04/16/2012 0806   PLT 291 04/06/2020 1605    BMET    Component Value Date/Time   NA 128 (L) 04/06/2020 1605   NA 135 (A) 04/16/2012 0807   K 4.3 04/06/2020 1605   K 4.2 04/16/2012 0807   CL 93 (L) 04/06/2020 1605   CL 99 04/16/2012 0807   CO2 23 04/06/2020 1605   CO2 26 04/16/2012 0807   GLUCOSE 113 (H) 04/06/2020 1605   BUN 8 04/06/2020 1605   BUN 6 04/16/2012 0807   CREATININE 0.73 04/06/2020 1605   CREATININE 0.67 (L) 11/10/2019 0839   CALCIUM 8.5 (L) 04/06/2020 1605   CALCIUM 9.0 04/16/2012 0807   GFRNONAA >60 04/06/2020 1605   GFRNONAA >89 03/03/2014 0957   GFRAA >60 04/06/2020 1605   GFRAA >89 03/03/2014 0957    COAG Lab Results  Component Value Date   INR 1.1 04/06/2020   INR 1.1 03/29/2020   INR 1.0 11/10/2019   No results found for: PTT  Antibiotics Anti-infectives (From admission, onward)    Start     Dose/Rate Route Frequency Ordered Stop   04/07/20 0530  vancomycin (VANCOREADY) IVPB 750 mg/150 mL     750 mg 150 mL/hr over 60 Minutes Intravenous Every 12 hours 04/06/20 1559     04/06/20 1615  vancomycin (VANCOREADY) IVPB 1250 mg/250 mL     1,250 mg 166.7 mL/hr over 90 Minutes Intravenous  Once 04/06/20 1559 04/06/20 1959   04/06/20 1600  piperacillin-tazobactam (ZOSYN) IVPB 3.375 g     3.375 g 12.5 mL/hr over 240 Minutes Intravenous Every 8 hours 04/06/20 1538         V. Leia Alf, M.D., [facility] of [place] Office: [phone] Pager:  [phone]

## 2020-04-06 NOTE — Addendum Note (Signed)
Addended by: Barbie Banner on: 04/06/2020 01:35 PM   Modules accepted: Orders

## 2020-04-06 NOTE — Progress Notes (Signed)
    Subjective  -   The patient is recently status post right femoral endarterectomy with bovine patch angioplasty and stent placement.  He presented to the office today with erythema on his incision and was admitted.   Physical Exam:  Palpable pedal pulses on the right.  His incision is intact however it is cellulitic.       Assessment/Plan:    We discussed going to the operating room tomorrow for right groin exploration.  He may require removal of the bovine patch and placement of the vein patch.  This was discussed with the patient and he is in agreement.  Roy [name] 04/06/2020 9:59 PM --  Vitals:   04/06/20 1605 04/06/20 2026  BP: (!) 135/59 (!) 148/55  Pulse: 72 77  Resp: 13 20  Temp: 98.3 F (36.8 C) 97.6 F (36.4 C)  SpO2: 98% 96%    Intake/Output Summary (Last 24 hours) at 04/06/2020 2159 Last data filed at 04/06/2020 2100 Gross per 24 hour  Intake --  Output 250 ml  Net -250 ml     Laboratory CBC    Component Value Date/Time   WBC 8.2 04/06/2020 1605   HGB 9.2 (L) 04/06/2020 1605   HCT 27.7 (L) 04/06/2020 1605   HCT 43 04/16/2012 0806   PLT 291 04/06/2020 1605    BMET    Component Value Date/Time   NA 128 (L) 04/06/2020 1605   NA 135 (A) 04/16/2012 0807   K 4.3 04/06/2020 1605   K 4.2 04/16/2012 0807   CL 93 (L) 04/06/2020 1605   CL 99 04/16/2012 0807   CO2 23 04/06/2020 1605   CO2 26 04/16/2012 0807   GLUCOSE 113 (H) 04/06/2020 1605   BUN 8 04/06/2020 1605   BUN 6 04/16/2012 0807   CREATININE 0.73 04/06/2020 1605   CREATININE 0.67 (L) 11/10/2019 0839   CALCIUM 8.5 (L) 04/06/2020 1605   CALCIUM 9.0 04/16/2012 0807   GFRNONAA >60 04/06/2020 1605   GFRNONAA >89 03/03/2014 0957   GFRAA >60 04/06/2020 1605   GFRAA >89 03/03/2014 0957    COAG Lab Results  Component Value Date   INR 1.1 04/06/2020   INR 1.1 03/29/2020   INR 1.0 11/10/2019   No results found for: PTT  Antibiotics Anti-infectives (From admission, onward)    Start     Dose/Rate Route Frequency Ordered Stop   04/07/20 0530  vancomycin (VANCOREADY) IVPB 750 mg/150 mL     750 mg 150 mL/hr over 60 Minutes Intravenous Every 12 hours 04/06/20 1559     04/06/20 1615  vancomycin (VANCOREADY) IVPB 1250 mg/250 mL     1,250 mg 166.7 mL/hr over 90 Minutes Intravenous  Once 04/06/20 1559 04/06/20 1959   04/06/20 1600  piperacillin-tazobactam (ZOSYN) IVPB 3.375 g     3.375 g 12.5 mL/hr over 240 Minutes Intravenous Every 8 hours 04/06/20 1538         V. Leia Alf, M.D., [facility] of [place] Office: [phone] Pager:  [phone]

## 2020-04-06 NOTE — Progress Notes (Addendum)
Obtained consent form and placed in pt's chart. Sent COVID test, PCR swab and urine down to lab.   Lavenia Atlas, RN

## 2020-04-07 ENCOUNTER — Inpatient Hospital Stay (HOSPITAL_COMMUNITY): Payer: Medicare Other | Admitting: Certified Registered Nurse Anesthetist

## 2020-04-07 ENCOUNTER — Inpatient Hospital Stay (HOSPITAL_COMMUNITY): Admission: RE | Admit: 2020-04-07 | Payer: Medicare Other | Source: Home / Self Care | Admitting: Surgery

## 2020-04-07 ENCOUNTER — Encounter (HOSPITAL_COMMUNITY)
Admission: AD | Disposition: A | Discharge status: Home Health | Payer: Self-pay | Source: Ambulatory Visit | Attending: Surgery

## 2020-04-07 ENCOUNTER — Encounter (HOSPITAL_COMMUNITY): Payer: Self-pay | Admitting: Surgery

## 2020-04-07 DIAGNOSIS — I9789 Other postprocedural complications and disorders of the circulatory system, not elsewhere classified: Secondary | ICD-10-CM

## 2020-04-07 HISTORY — PX: APPLICATION OF WOUND VAC: SHX5189

## 2020-04-07 HISTORY — PX: GROIN DEBRIDEMENT: SHX5159

## 2020-04-07 SURGERY — DEBRIDEMENT, INGUINAL REGION
Anesthesia: General | Site: Groin | Laterality: Right

## 2020-04-07 MED ORDER — TAMSULOSIN HCL 0.4 MG PO CAPS
0.4000 mg | ORAL_CAPSULE | Freq: Two times a day (BID) | ORAL | Status: DC
  Administered 2020-04-07 – 2020-04-08 (×3): 0.4 mg via ORAL
  Filled 2020-04-07 (×2): qty 1

## 2020-04-07 MED ORDER — VANCOMYCIN HCL 1000 MG IV SOLR
INTRAVENOUS | Status: DC | PRN
  Administered 2020-04-07: 1000 mg

## 2020-04-07 MED ORDER — ONDANSETRON HCL 4 MG/2ML IJ SOLN
INTRAMUSCULAR | Status: DC | PRN
  Administered 2020-04-07: 4 mg via INTRAVENOUS

## 2020-04-07 MED ORDER — FENTANYL CITRATE (PF) 100 MCG/2ML IJ SOLN
INTRAMUSCULAR | Status: DC | PRN
  Administered 2020-04-07 (×2): 50 ug via INTRAVENOUS
  Administered 2020-04-07 (×2): 25 ug via INTRAVENOUS
  Administered 2020-04-07: 100 ug via INTRAVENOUS

## 2020-04-07 MED ORDER — SACUBITRIL-VALSARTAN 49-51 MG PO TABS
1.0000 | ORAL_TABLET | Freq: Two times a day (BID) | ORAL | Status: DC
  Administered 2020-04-07 – 2020-04-08 (×3): 1 via ORAL
  Filled 2020-04-07 (×2): qty 1

## 2020-04-07 MED ORDER — PHENYLEPHRINE HCL-NACL 10-0.9 MG/250ML-% IV SOLN
INTRAVENOUS | Status: DC | PRN
  Administered 2020-04-07: 40 ug/min via INTRAVENOUS

## 2020-04-07 MED ORDER — SODIUM CHLORIDE 0.9 % IV SOLN
INTRAVENOUS | Status: DC | PRN
  Administered 2020-04-07: 500 mL

## 2020-04-07 MED ORDER — DEXAMETHASONE SODIUM PHOSPHATE 10 MG/ML IJ SOLN
INTRAMUSCULAR | Status: DC | PRN
Start: 2020-04-07 — End: 2020-04-07
  Administered 2020-04-07: 4 mg via INTRAVENOUS

## 2020-04-07 MED ORDER — ASPIRIN EC 81 MG PO TBEC
81.0000 mg | DELAYED_RELEASE_TABLET | Freq: Every evening | ORAL | Status: DC
  Administered 2020-04-07: 81 mg via ORAL
  Filled 2020-04-07: qty 1

## 2020-04-07 MED ORDER — PROPOFOL 10 MG/ML IV BOLUS
INTRAVENOUS | Status: DC | PRN
  Administered 2020-04-07: 100 mg via INTRAVENOUS

## 2020-04-07 MED ORDER — LACTATED RINGERS IV SOLN: INTRAVENOUS | Status: DC

## 2020-04-07 MED ORDER — ATORVASTATIN CALCIUM 10 MG PO TABS
20.0000 mg | ORAL_TABLET | Freq: Every day | ORAL | Status: DC
  Administered 2020-04-07: 20 mg via ORAL
  Filled 2020-04-07: qty 2

## 2020-04-07 MED ORDER — FENTANYL CITRATE (PF) 100 MCG/2ML IJ SOLN: 25.0000 ug | INTRAMUSCULAR | Status: DC | PRN

## 2020-04-07 MED ORDER — TICAGRELOR 90 MG PO TABS
90.0000 mg | ORAL_TABLET | Freq: Two times a day (BID) | ORAL | Status: DC
  Administered 2020-04-07 – 2020-04-08 (×3): 90 mg via ORAL
  Filled 2020-04-07 (×3): qty 1

## 2020-04-07 MED ORDER — VANCOMYCIN HCL 1000 MG IV SOLR
INTRAVENOUS | Status: AC
  Filled 2020-04-07: qty 1000

## 2020-04-07 MED ORDER — ONDANSETRON HCL 4 MG/2ML IJ SOLN: 4.0000 mg | Freq: Once | INTRAMUSCULAR | Status: DC | PRN

## 2020-04-07 MED ORDER — NICOTINE 21 MG/24HR TD PT24
21.0000 mg | MEDICATED_PATCH | TRANSDERMAL | Status: DC
  Administered 2020-04-07: 21 mg via TRANSDERMAL
  Filled 2020-04-07: qty 1

## 2020-04-07 MED ORDER — MORPHINE SULFATE (PF) 2 MG/ML IV SOLN: 2.0000 mg | INTRAVENOUS | Status: DC | PRN

## 2020-04-07 MED ORDER — VANCOMYCIN HCL 500 MG IV SOLR
INTRAVENOUS | Status: AC
  Filled 2020-04-07: qty 500

## 2020-04-07 MED ORDER — GENTAMICIN SULFATE 40 MG/ML IJ SOLN
INTRAMUSCULAR | Status: DC | PRN
Start: 2020-04-07 — End: 2020-04-07
  Administered 2020-04-07: 80 mg
  Administered 2020-04-07: 160 mg

## 2020-04-07 MED ORDER — OXYCODONE-ACETAMINOPHEN 5-325 MG PO TABS: 1.0000 | ORAL_TABLET | ORAL | Status: DC | PRN

## 2020-04-07 MED ORDER — PHENYLEPHRINE 40 MCG/ML (10ML) SYRINGE FOR IV PUSH (FOR BLOOD PRESSURE SUPPORT)
PREFILLED_SYRINGE | INTRAVENOUS | Status: DC | PRN
  Administered 2020-04-07 (×2): 120 ug via INTRAVENOUS

## 2020-04-07 MED ORDER — SUGAMMADEX SODIUM 200 MG/2ML IV SOLN
INTRAVENOUS | Status: DC | PRN
  Administered 2020-04-07: 200 mg via INTRAVENOUS

## 2020-04-07 MED ORDER — CARVEDILOL 3.125 MG PO TABS
3.1250 mg | ORAL_TABLET | Freq: Two times a day (BID) | ORAL | Status: DC
  Administered 2020-04-07 – 2020-04-08 (×2): 3.125 mg via ORAL
  Filled 2020-04-07 (×2): qty 1

## 2020-04-07 MED ORDER — GENTAMICIN SULFATE 40 MG/ML IJ SOLN
INTRAMUSCULAR | Status: AC
  Filled 2020-04-07: qty 2

## 2020-04-07 MED ORDER — ROCURONIUM BROMIDE 10 MG/ML (PF) SYRINGE
PREFILLED_SYRINGE | INTRAVENOUS | Status: DC | PRN
  Administered 2020-04-07: 50 mg via INTRAVENOUS

## 2020-04-07 MED ORDER — SODIUM CHLORIDE 0.9 % IR SOLN
Status: DC | PRN
  Administered 2020-04-07: 3000 mL

## 2020-04-07 MED ORDER — LIDOCAINE 2% (20 MG/ML) 5 ML SYRINGE
INTRAMUSCULAR | Status: DC | PRN
  Administered 2020-04-07: 60 mg via INTRAVENOUS

## 2020-04-07 MED ORDER — 0.9 % SODIUM CHLORIDE (POUR BTL) OPTIME
TOPICAL | Status: DC | PRN
  Administered 2020-04-07: 1000 mL

## 2020-04-07 MED ORDER — GABAPENTIN 300 MG PO CAPS
300.0000 mg | ORAL_CAPSULE | Freq: Two times a day (BID) | ORAL | Status: DC
  Administered 2020-04-07 – 2020-04-08 (×3): 300 mg via ORAL
  Filled 2020-04-07 (×2): qty 1

## 2020-04-07 SURGICAL SUPPLY — 44 items
CANISTER SUCT 3000ML PPV (MISCELLANEOUS) ×4 IMPLANT
CLIP VESOCCLUDE MED 24/CT (CLIP) ×2 IMPLANT
CLIP VESOCCLUDE SM WIDE 24/CT (CLIP) ×2 IMPLANT
DRSG VAC ATS SM SENSATRAC (GAUZE/BANDAGES/DRESSINGS) ×2 IMPLANT
ELECT REM PT RETURN 9FT ADLT (ELECTROSURGICAL) ×4
ELECTRODE REM PT RTRN 9FT ADLT (ELECTROSURGICAL) ×2 IMPLANT
GAUZE SPONGE 4X4 12PLY STRL (GAUZE/BANDAGES/DRESSINGS) ×4 IMPLANT
GLOVE BIOGEL PI IND STRL 6.5 (GLOVE) IMPLANT
GLOVE BIOGEL PI IND STRL 7.5 (GLOVE) IMPLANT
GLOVE BIOGEL PI INDICATOR 6.5 (GLOVE) ×6
GLOVE BIOGEL PI INDICATOR 7.5 (GLOVE) ×2
GLOVE SS BIOGEL STRL SZ 6.5 (GLOVE) IMPLANT
GLOVE SS BIOGEL STRL SZ 7 (GLOVE) ×2 IMPLANT
GLOVE SUPERSENSE BIOGEL SZ 6.5 (GLOVE) ×2
GLOVE SUPERSENSE BIOGEL SZ 7 (GLOVE) ×2
GLOVE SURG SS PI 7.5 STRL IVOR (GLOVE) ×2 IMPLANT
GOWN STRL REUS W/ TWL LRG LVL3 (GOWN DISPOSABLE) ×6 IMPLANT
GOWN STRL REUS W/ TWL XL LVL3 (GOWN DISPOSABLE) IMPLANT
GOWN STRL REUS W/TWL LRG LVL3 (GOWN DISPOSABLE) ×16
GOWN STRL REUS W/TWL XL LVL3 (GOWN DISPOSABLE) ×4
HANDPIECE INTERPULSE COAX TIP (DISPOSABLE) ×4
KIT BASIN OR (CUSTOM PROCEDURE TRAY) ×4 IMPLANT
KIT STIMULAN RAPID CURE  10CC (Orthopedic Implant) ×4 IMPLANT
KIT STIMULAN RAPID CURE 10CC (Orthopedic Implant) IMPLANT
KIT TURNOVER KIT B (KITS) ×4 IMPLANT
NS IRRIG 1000ML POUR BTL (IV SOLUTION) ×4 IMPLANT
PACK GENERAL/GYN (CUSTOM PROCEDURE TRAY) ×2 IMPLANT
PACK PERIPHERAL VASCULAR (CUSTOM PROCEDURE TRAY) ×2 IMPLANT
PACK UNIVERSAL I (CUSTOM PROCEDURE TRAY) ×2 IMPLANT
PAD ARMBOARD 7.5X6 YLW CONV (MISCELLANEOUS) ×8 IMPLANT
SET HNDPC FAN SPRY TIP SCT (DISPOSABLE) IMPLANT
STAPLER VISISTAT 35W (STAPLE) IMPLANT
SUT ETHILON 3 0 PS 1 (SUTURE) IMPLANT
SUT PROLENE 5 0 C 1 24 (SUTURE) ×2 IMPLANT
SUT PROLENE 6 0 BV (SUTURE) ×2 IMPLANT
SUT VIC AB 2-0 CT1 27 (SUTURE) ×4
SUT VIC AB 2-0 CT1 TAPERPNT 27 (SUTURE) IMPLANT
SUT VIC AB 2-0 CTX 36 (SUTURE) ×2 IMPLANT
SUT VIC AB 3-0 SH 27 (SUTURE)
SUT VIC AB 3-0 SH 27X BRD (SUTURE) IMPLANT
SWAB COLLECTION DEVICE MRSA (MISCELLANEOUS) ×2 IMPLANT
SWAB CULTURE ESWAB REG 1ML (MISCELLANEOUS) ×2 IMPLANT
TOWEL GREEN STERILE (TOWEL DISPOSABLE) ×6 IMPLANT
WATER STERILE IRR 1000ML POUR (IV SOLUTION) ×4 IMPLANT

## 2020-04-07 NOTE — Progress Notes (Signed)
  Day of Surgery Note    Subjective: Pain controlled.  Tolerating diet without nausea or vomiting.  Voiding spontaneously.   Vitals:   04/07/20 1126 04/07/20 1147  BP: (!) 139/94 126/61  Pulse: 64 64  Resp: 17 18  Temp: 98 F (36.7 C) 98.2 F (36.8 C)  SpO2: 100% 97%    Incisions:   Right groin wound with wound VAC in place with good seal Extremities: Active range of motion right foot.  2+ palpable dorsalis pedis pulse Cardiac: Rate and rhythm are regular Lungs: Nonlabored    Assessment/Plan:  This is a 74 y.o. male who is s/p  Right groin incision, washout and placement of antibiotic pellets.  Deep layers closed and skin layer covered with wound VAC at 75 mmHg.  Continue vancomycin and await intraoperative culture results. Vital signs stable.  Brilinta resumed  - Risa Grill, PA-C 04/07/2020 2:57 PM [phone]

## 2020-04-07 NOTE — Progress Notes (Signed)
Remote ICD transmission.   

## 2020-04-07 NOTE — Anesthesia Procedure Notes (Signed)
Procedure Name: Intubation Date/Time: 04/07/2020 10:01 AM Performed by: Leonor Liv, CRNA Pre-anesthesia Checklist: Patient identified, Emergency Drugs available, Suction available and Patient being monitored Patient Re-evaluated:Patient Re-evaluated prior to induction Oxygen Delivery Method: Circle System Utilized Preoxygenation: Pre-oxygenation with 100% oxygen Induction Type: IV induction Ventilation: Mask ventilation without difficulty Laryngoscope Size: Mac and 4 Grade View: Grade I Tube type: Oral Tube size: 7.5 mm Number of attempts: 1 Airway Equipment and Method: Stylet and Oral airway Placement Confirmation: ETT inserted through vocal cords under direct vision,  positive ETCO2 and breath sounds checked- equal and bilateral Secured at: 23 cm Tube secured with: Tape Dental Injury: Teeth and Oropharynx as per pre-operative assessment

## 2020-04-07 NOTE — TOC Initial Note (Signed)
Transition of Care [facility]) - Initial/Assessment Note    Patient Details  Name: Roy [name] MRN: [id number] Date of Birth: December 23, 1945  Transition of Care [facility]) CM/SW Contact:    Verdell Carmine, RN Phone Number: 04/07/2020, 2:03 PM  Clinical Narrative:                 Patient readmission for a cellulotic incision. Had recent surgery, a Right external iliac, common femoral profundofemoral and SFA endarterectomy with bovine patch angioplasty. Stents to Common iliac and SFA. . I&D performed today with washout. Now with wound vac. Awaiting decision on if he will have wound vac at home. He had [facility] for home health RN leading up to admission. Will continue after discharge. [facility] Notified of admission and possibility of wound vac at home. Will continue to follow for home needs.   Expected Discharge Plan: [place] Barriers to Discharge: Continued Medical Work up   Patient Goals and CMS Choice Patient states their goals for this hospitalization and ongoing recovery are:: Go home as soon as possible CMS Medicare.gov Compare Post Acute Care list provided to:: Patient Choice offered to / list presented to : Patient  Expected Discharge Plan and Services Expected Discharge Plan: [place]   Discharge Planning Services: CM Consult Post Acute Care Choice: Durable Medical Equipment Living arrangements for the past 2 months: [place] Agency: [facility] Date [place]: 04/07/20 Time HH Agency Contacted: 69 Representative spoke with at [place]: Pollie Meyer  Prior Living Arrangements/Services Living arrangements for the past 2 months: [place] with:: Spouse Patient language and need for interpreter reviewed:: Yes Do you feel safe going back to the place where you live?: Yes      Need for Family Participation in Patient Care: Yes (Comment) Care giver  support system in place?: Yes (comment) Current home services: Home RN Criminal Activity/Legal Involvement Pertinent to Current Situation/Hospitalization: No - Comment as needed  Activities of Daily Living Home Assistive Devices/Equipment: Eyeglasses ADL Screening (condition at time of admission) Patient's cognitive ability adequate to safely complete daily activities?: Yes Is the patient deaf or have difficulty hearing?: Yes Does the patient have difficulty seeing, even when wearing glasses/contacts?: No Does the patient have difficulty concentrating, remembering, or making decisions?: No Patient able to express need for assistance with ADLs?: Yes Does the patient have difficulty dressing or bathing?: No Independently performs ADLs?: Yes (appropriate for developmental age) Does the patient have difficulty walking or climbing stairs?: No Weakness of Legs: None Weakness of Arms/Hands: None  Permission Sought/Granted         Permission granted to share info w AGENCY: [facility]        Emotional Assessment Appearance:: Appears stated age Attitude/Demeanor/Rapport: Complaining Affect (typically observed): Irritable, Other (comment)(Just want to get out ofhospital as soon as possible) Orientation: : Oriented to Self, Oriented to Place, Oriented to  Time, Oriented to Situation Alcohol / Substance Use: Tobacco Use Psych Involvement: No (comment)  Admission diagnosis:  Infected surgical wound [T81.49XA] Patient Active Problem List   Diagnosis Date Noted  . Infected surgical wound 04/06/2020  . Peripheral vascular disease ([place]) 03/31/2020  . Claudication in peripheral vascular disease ([place]) 03/03/2020  . Complication of surgical procedure 09/03/2017  . History of alcohol abuse  08/19/2017  . PAD (peripheral artery disease) ([place]) 08/16/2017  . ICD (implantable cardioverter-defibrillator), biventricular, in situ 12/01/2015  . Cardiomyopathy, ischemic 08/20/2015  . Normocytic anemia  10/14/2014  . Weight gain 06/29/2014  . Hypokalemia 06/29/2014  . Chronic systolic heart failure ([place]) 06/15/2014  . COPD exacerbation ([place]) 06/15/2014  . Hypernatremia 06/15/2014  . Acute renal failure ([place]) 06/15/2014  . Encephalopathy 06/15/2014  . Loose stools 06/15/2014  . Respiratory failure ([place]) 06/03/2014  . Acute respiratory failure ([place]) 06/03/2014  . Pneumonia, organism unspecified(486) 06/03/2014  . Elevated troponin 06/03/2014  . NSTEMI (non-ST elevated myocardial infarction) ([place]) 06/03/2014  . Acute on chronic diastolic heart failure ([place]) 06/03/2014  . Peripheral vascular disease, unspecified ([place]) 04/26/2014  . Melena 03/03/2014  . Cirrhosis, alcoholic ([place]) [id number]  . Alcohol withdrawal delirium ([place]) 10/09/2012  . Colon adenomas 06/18/2012  . Degenerative joint disease   . LBBB (left bundle branch block)   . Tobacco abuse   . Alcohol dependence ([place])   . Thrombocytopenia ([place])   . Hypertensive heart disease   . Anxiety and depression   . Hyperlipidemia 10/03/2010  . CEREBROVASCULAR DISEASE 10/03/2010  . CHRONIC OBSTRUCTIVE PULMONARY DISEASE 10/03/2010  . DEGENERATIVE JOINT DISEASE, SHOULDER 10/02/2007   PCP:  Celene Squibb, MD Pharmacy:   [facility] - [place], [address] [place] [address] Phone: [phone] Fax: [phone]  [facility] [address] [place] - [place] [address] [place] [address] Phone: [phone] Fax: [phone]     Social Determinants of Health (SDOH) Interventions    Readmission Risk Interventions Readmission Risk Prevention Plan 04/01/2020  Post Dischage Appt Complete  Medication Screening Complete  Transportation Screening Complete  Some recent data might be hidden

## 2020-04-07 NOTE — Interval H&P Note (Signed)
History and Physical Interval Note:  04/07/2020 9:11 AM  Roy [name]  has presented today for surgery, with the diagnosis of INFECTED RIGHT GROIN INCISION.  The various methods of treatment have been discussed with the patient and family. After consideration of risks, benefits and other options for treatment, the patient has consented to  Procedure(s): INCISION AND DRAINAGE OF RIGHT GROIN (Right) as a surgical intervention.  The patient's history has been reviewed, patient examined, no change in status, stable for surgery.  I have reviewed the patient's chart and labs.  Questions were answered to the patient's satisfaction.     Annamarie Major

## 2020-04-07 NOTE — Op Note (Signed)
    Patient name: Roy [name] MRN: [id number] DOB: 1946/10/11 Sex: male  04/07/2020 Pre-operative Diagnosis: infected right groin Post-operative diagnosis:  Same Surgeon:  Annamarie Major Assistants: Risa Grill Procedure:    #1:  Re-opening of recent right femoral artery incision   #2:  Incision and drainage of right groin seroma   #3:  Placement of antibiotic impregnated beads   #4:  Placement of wound vac (8cmx x 0.5cm x 0.5cm Anesthesia:  General Blood Loss:  minimal Specimens:  Anaerobic and aerobic cultures  Findings:  Infected seroma.  The patch was intact and did not appear infected  Indications: The patient has recently undergone right femoral endarterectomy with bovine pericardial patch angioplasty.  He presented to the clinic with fullness in his incision and overlying cellulitis.  He was admitted for IV antibiotics and comes in today for incision and drainage.  Procedure:  The patient was identified in the holding area and taken to [place] 12  The patient was then placed supine on the table. general anesthesia was administered.  The patient was prepped and draped in the usual sterile fashion.  A time out was called and antibiotics were administered.  The patient's previous longitudinal right groin incision was opened with a 10 blade.  Scissors were used to cut through the suture closure.  There was a large amount of lymphatic fluid that was present.  Anaerobic and aerobic cultures were sent.  I opened the incision up down to the patch repair.  This was intact.  The seroma fluid was then evacuated.  I then irrigated the wound with 3 L of saline using a Pulsavac.  I then prepared antibiotic impregnated beads with vancomycin and gentamicin.  Beads were placed directly over top of the patch repair and the femoral sheath was reapproximated with 2-0 Vicryl.  Additional beads were placed in the subcutaneous tissue which was closed with 3-0 vicryl.  A wound vac was placed over the wound  (8x0.5x0.5 cm).  There ware no immediate complications.   Disposition:  To PACU satble   V. Annamarie Major, M.D., [facility] [place] Office: [phone] Pager:  [phone]

## 2020-04-07 NOTE — Anesthesia Postprocedure Evaluation (Signed)
Anesthesia Post Note  Patient: DIANDRE DIMARTINO  Procedure(s) Performed: INCISION AND DRAINAGE OF RIGHT GROIN (Right ) PLACEMENT OF ANTIBIOTIC BEADS AND APPLICATION OF WOUND VAC (Right Groin)     Patient location during evaluation: PACU Anesthesia Type: General Level of consciousness: awake Pain management: pain level controlled Vital Signs Assessment: post-procedure vital signs reviewed and stable Respiratory status: spontaneous breathing, nonlabored ventilation, respiratory function stable and patient connected to nasal cannula oxygen Cardiovascular status: blood pressure returned to baseline and stable Postop Assessment: no apparent nausea or vomiting Anesthetic complications: no    Last Vitals:  Vitals:   04/07/20 1126 04/07/20 1147  BP: (!) 139/94 126/61  Pulse: 64 64  Resp: 17 18  Temp: 36.7 C 36.8 C  SpO2: 100% 97%    Last Pain:  Vitals:   04/07/20 1147  TempSrc: Oral  PainSc: 0-No pain                 Sophiagrace Benbrook P Lahoma Constantin

## 2020-04-07 NOTE — Anesthesia Preprocedure Evaluation (Addendum)
Anesthesia Evaluation  Patient identified by MRN, date of birth, ID band Patient awake    Reviewed: Allergy & Precautions, NPO status , Patient's Chart, lab work & pertinent test results  Airway Mallampati: II  TM Distance: >3 FB Neck ROM: Full    Dental  (+) Edentulous Lower, Edentulous Upper   Pulmonary COPD,  COPD inhaler, Current Smoker and Patient abstained from smoking.,    Pulmonary exam normal breath sounds clear to auscultation       Cardiovascular hypertension, Pt. on home beta blockers + CAD, + Past MI, + Peripheral Vascular Disease and +CHF  Normal cardiovascular exam+ dysrhythmias + pacemaker + Cardiac Defibrillator  Rhythm:Regular Rate:Normal  ECG: rate 68  ECHO: Left ventricle: The cavity size was normal. Wall thickness was normal. Systolic function was normal. The estimated ejection fraction was in the range of 50% to 55%. Wall motion was normal; there were no regional wall motion abnormalities. Doppler parameters are consistent with abnormal left ventricular relaxation (grade 1 diastolic dysfunction). Indeterminate filling pressures.  Aortic valve: Trileaflet; mildly thickened leaflets.  Mitral valve: Calcified annulus. Normal thickness leaflets .  There was mild regurgitation.  Atrial septum: No defect or patent foramen ovale was identified.    Neuro/Psych PSYCHIATRIC DISORDERS Anxiety Depression Cerebrovascular disease [facility]) TIA   GI/Hepatic negative GI ROS, (+)     substance abuse  ,   Endo/Other  hyponatremia  Renal/GU negative Renal ROS     Musculoskeletal  (+) Arthritis ,   Abdominal   Peds  Hematology  (+) anemia , HLD   Anesthesia Other Findings INFECTED RIGHT GROIN INCISION  Reproductive/Obstetrics                            Anesthesia Physical Anesthesia Plan  ASA: III  Anesthesia Plan: General   Post-op Pain Management:    Induction:  Intravenous  PONV Risk Score and Plan: 1 and Ondansetron, Dexamethasone and Treatment may vary due to age or medical condition  Airway Management Planned: Oral ETT  Additional Equipment:   Intra-op Plan:   Post-operative Plan: Extubation in OR  Informed Consent: I have reviewed the patients History and Physical, chart, labs and discussed the procedure including the risks, benefits and alternatives for the proposed anesthesia with the patient or authorized representative who has indicated his/her understanding and acceptance.     Dental advisory given  Plan Discussed with: CRNA  Anesthesia Plan Comments:         Anesthesia Quick Evaluation

## 2020-04-07 NOTE — Progress Notes (Signed)
Pt arrived back to rm 13 from PACU with wound vac on 75 mmHg. Reinitiated tele, VSS, able to wean pt off from 2L. Cal bell within reach.   Lavenia Atlas, RN

## 2020-04-07 NOTE — Transfer of Care (Signed)
Immediate Anesthesia Transfer of Care Note  Patient: Roy [name]  Procedure(s) Performed: INCISION AND DRAINAGE OF RIGHT GROIN (Right ) PLACEMENT OF ANTIBIOTIC BEADS AND APPLICATION OF WOUND VAC (Right Groin)  Patient Location: PACU  Anesthesia Type:General  Level of Consciousness: awake  Airway & Oxygen Therapy: Patient Spontanous Breathing and Patient connected to nasal cannula oxygen  Post-op Assessment: Report given to RN and Post -op Vital signs reviewed and stable  Post vital signs: Reviewed and stable  Last Vitals:  Vitals Value Taken Time  BP 146/72 04/07/20 1102  Temp    Pulse 63 04/07/20 1104  Resp 22 04/07/20 1104  SpO2 99 % 04/07/20 1104  Vitals shown include unvalidated device data.  Last Pain:  Vitals:   04/07/20 0813  TempSrc:   PainSc: 0-No pain         Complications: No apparent anesthesia complications

## 2020-04-08 ENCOUNTER — Other Ambulatory Visit: Payer: Self-pay | Admitting: Internal Medicine

## 2020-04-08 ENCOUNTER — Encounter: Payer: Self-pay | Admitting: *Deleted

## 2020-04-08 MED ORDER — OXYCODONE-ACETAMINOPHEN 10-325 MG PO TABS: 1.0000 | ORAL_TABLET | Freq: Four times a day (QID) | ORAL | 0 refills | Status: DC | PRN

## 2020-04-08 MED ORDER — SULFAMETHOXAZOLE-TRIMETHOPRIM 800-160 MG PO TABS: 1.0000 | ORAL_TABLET | Freq: Two times a day (BID) | ORAL | Status: AC

## 2020-04-08 NOTE — Discharge Summary (Signed)
Discharge Summary  Patient ID: Roy [name] [id number] 74 y.o. Jul 05, 1946  Admit date: 04/06/2020  Discharge date and time: 04/08/2020  Admitting Physician: [name], MD   Discharge Physician: Dr. [name]  Admission Diagnoses: Infected surgical wound [T81.49XA]  Discharge Diagnoses: infected surgical wound, PAD  Admission Condition: good  Discharged Condition: good  Indication for Admission: Edema, erythema, induration of right groin incision  Hospital Course: The patient is a 74 year old male who had undergone right external iliac, common femoral, profunda femoral and superficial femoral endarterectomy with bovine pericardial patch angioplasty on Mar 31, 2020.  He presented to the office on May 12 with complaints of swelling and erythema of his right groin.  There was concern for infected hematoma or seroma and he was admitted for further evaluation and treatment.  On the day of admission, broad-spectrum antibiotics were initiated.  His Brilinta was held.  The following day, May 13, he was taken to the operating room where he went incision and drainage of his right groin with placement of antibiotic pellets and negative pressure wound dressing.  The patch angioplasty was intact and incorporating.  He tolerated the procedure well.  He maintained a palpable right dorsalis pedis pulse.  His vital signs remained stable throughout his hospital course and he remained afebrile.  His Brilinta was resumed.  Intraoperative cultures were obtained and the Gram stain revealed few WBCs.  He was improved and ready for discharge home and arrangements were made for wound VAC dressing change on Saturday, May 15.  He was discharged home on Septra DS.  Consults: None  Treatments: surgery: I and D of right groin, antibiotics, Wound VAc  Discharge Exam:  Vitals:   04/08/20 0347 04/08/20 0820  BP: 136/82 119/72  Pulse: 70 70  Resp: 18 17  Temp: 98.5 F (36.9 C) 97.6 F (36.4 C)  SpO2: 99%  98%   Cardiac:  RRR Lungs:  nonlabored Extremities:  Right lower extremity: Right groin and proximal thigh erythema is much improved.  Induration improved.  VAC dressing in place with good seal.  Minimal bloody drainage.  2+ palp dorsalis pedis  Abdomen:  Soft, ND Neurologic: alert and oriented x 4   Disposition: Discharge disposition: 06-Home-Health Care Svc       Patient Instructions:  Allergies as of 04/08/2020   No Known Allergies      Medication List     STOP taking these medications    oxyCODONE 5 MG immediate release tablet Commonly known as: Oxy IR/ROXICODONE       TAKE these medications    acetaminophen 500 MG tablet Commonly known as: TYLENOL Take 1,000 mg by mouth as needed for moderate pain or headache.   aspirin EC 81 MG tablet Take 81 mg by mouth every evening.   atorvastatin 20 MG tablet Commonly known as: LIPITOR Take 20 mg by mouth daily.   Brilinta 90 MG Tabs tablet Generic drug: ticagrelor Take 90 mg by mouth 2 (two) times daily.   carvedilol 3.125 MG tablet Commonly known as: COREG TAKE 1 TABLET TWICE A DAY What changed:   how much to take  how to take this  when to take this  additional instructions   CENTRUM SILVER PO Take 1 tablet by mouth daily. 50+   cephALEXin 500 MG capsule Commonly known as: KEFLEX Take 1 capsule (500 mg total) by mouth 2 (two) times daily for 7 days.   dextromethorphan-guaiFENesin 30-600 MG 12hr tablet Commonly known as: MUCINEX DM Take 1 tablet  by mouth daily.   Entresto 49-51 MG Generic drug: sacubitril-valsartan TAKE 1 TABLET TWICE A DAY   gabapentin 300 MG capsule Commonly known as: NEURONTIN Take 300 mg by mouth 2 (two) times daily.   magnesium oxide 400 MG tablet Commonly known as: MAG-OX Take 400 mg by mouth daily.   omega-3 acid ethyl esters 1 g capsule Commonly known as: LOVAZA Take 1 g by mouth daily.   oxyCODONE-acetaminophen 10-325 MG tablet Commonly known as:  Percocet Take 1 tablet by mouth every 6 (six) hours as needed for pain.   SUPER B COMPLEX/C PO Take 1 tablet by mouth at bedtime.   tamsulosin 0.4 MG Caps capsule Commonly known as: FLOMAX Take 0.4 mg by mouth 2 (two) times daily.       Activity: activity as tolerated Diet: cardiac diet Wound Care:  Wound VAC to right groin; change M/W/F  Follow-up with Dr. [name] in  on Monday following discharge  day.  Signed: Barbie Banner, PA-C 04/08/2020 9:56 AM [facility] Office: [phone]

## 2020-04-08 NOTE — Progress Notes (Signed)
  Progress Note    04/08/2020 7:43 AM 1 Day Post-Op  Subjective: His pain is controlled and he has no other complaints.  He is tolerating his diet.   Vitals:   04/07/20 2347 04/08/20 0347  BP: 115/60 136/82  Pulse: 66 70  Resp: 18 18  Temp: 97.7 F (36.5 C) 98.5 F (36.9 C)  SpO2: 97% 99%    Physical Exam: Cardiac: Heart rate and rhythm are regular Lungs: Clear to auscultation bilaterally Extremities: Right lower extremity examined.  Right groin and proximal thigh erythema is much improved.  Induration improved.  VAC dressing in place with good seal.  Minimal bloody drainage.  2+ palp dorsalis pedis pulse   CBC    Component Value Date/Time   WBC 8.2 04/06/2020 1605   RBC 2.98 (L) 04/06/2020 1605   HGB 9.2 (L) 04/06/2020 1605   HCT 27.7 (L) 04/06/2020 1605   HCT 43 04/16/2012 0806   PLT 291 04/06/2020 1605   MCV 93.0 04/06/2020 1605   MCH 30.9 04/06/2020 1605   MCHC 33.2 04/06/2020 1605   RDW 12.1 04/06/2020 1605   LYMPHSABS 1,818 11/10/2019 0839   MONOABS 0.6 07/22/2019 0654   EOSABS 679 (H) 11/10/2019 0839   BASOSABS 51 11/10/2019 0839    BMET    Component Value Date/Time   NA 128 (L) 04/06/2020 1605   NA 135 (A) 04/16/2012 0807   K 4.3 04/06/2020 1605   K 4.2 04/16/2012 0807   CL 93 (L) 04/06/2020 1605   CL 99 04/16/2012 0807   CO2 23 04/06/2020 1605   CO2 26 04/16/2012 0807   GLUCOSE 113 (H) 04/06/2020 1605   BUN 8 04/06/2020 1605   BUN 6 04/16/2012 0807   CREATININE 0.73 04/06/2020 1605   CREATININE 0.67 (L) 11/10/2019 0839   CALCIUM 8.5 (L) 04/06/2020 1605   CALCIUM 9.0 04/16/2012 0807   GFRNONAA >60 04/06/2020 1605   GFRNONAA >89 03/03/2014 0957   GFRAA >60 04/06/2020 1605   GFRAA >89 03/03/2014 0957     Intake/Output Summary (Last 24 hours) at 04/08/2020 0743 Last data filed at 04/08/2020 0600 Gross per 24 hour  Intake 2152.81 ml  Output 2260 ml  Net -107.19 ml    HOSPITAL MEDICATIONS Scheduled Meds: . aspirin EC  81 mg Oral QPM    . atorvastatin  20 mg Oral q1800  . carvedilol  3.125 mg Oral BID WC  . gabapentin  300 mg Oral BID  . nicotine  21 mg Transdermal Q24H  . pantoprazole  40 mg Oral Daily  . potassium chloride  20-40 mEq Oral Once  . sacubitril-valsartan  1 tablet Oral BID  . tamsulosin  0.4 mg Oral BID  . ticagrelor  90 mg Oral BID   Continuous Infusions: . piperacillin-tazobactam (ZOSYN)  IV 3.375 g (04/07/20 2358)  . vancomycin 150 mL/hr at 04/08/20 0600   PRN Meds:.acetaminophen **OR** acetaminophen, alum & mag hydroxide-simeth, guaiFENesin-dextromethorphan, hydrALAZINE, labetalol, metoprolol tartrate, morphine injection, ondansetron, oxyCODONE-acetaminophen, phenol  Assessment: Status post right groin incision, drainage and placement of antibiotic pellets.  Has remained afebrile.  Vancomycin and Zosyn continue.       Plan: -He is stable for discharge home.  If arrangements can be made for wound VAC change at home on Saturday he can be discharged today with follow-up on Monday.  Will discharge home on Bactrim   Risa Grill, PA-C [facility] [phone] 04/08/2020  7:43 AM

## 2020-04-08 NOTE — Progress Notes (Signed)
Discharge instructions provided to patient. IV out. Monitor off CCMD notified. All medications, follow up appointments, and discharge instructions provided. Patient discharging home.  Paulene Floor, RN

## 2020-04-08 NOTE — TOC Transition Note (Signed)
Transition of Care [facility]) - CM/SW Discharge Note   Patient Details  Name: Roy [name] MRN: [id number] Date of Birth: [birth year]  [facility]) CM/SW Contact:  Verdell Carmine, RN Phone Number: 04/08/2020, 3:54 PM   Clinical Narrative:    Patient received wound vac from [facility], able to be discharged today per vascular, will follow up with Dr Nechama Guard on Monday. [facility] will see him tomorrow at home to change dressing.  NO further needs identified. Patient states he feels comfortable with the equipment. He has had a wound vac previously.   Final next level of care: [place] Barriers to Discharge: No Barriers Identified   Patient Goals and CMS Choice Patient states their goals for this hospitalization and ongoing recovery are:: Go home as soon as possible CMS Medicare.gov Compare Post Acute Care list provided to:: Patient Choice offered to / list presented to : Patient  Discharge Placement  Home with wound vac and home Health RN                     Discharge Plan and Services   Discharge Planning Services: CM Consult Post Acute Care Choice: Durable Medical Equipment            DME Agency: [place] Date DME Agency Contacted: 04/08/20 Time DME Agency Contacted: 1000 Representative spoke with at DME Agency: [place]: [facility] [place] Date [place]: 04/07/20 Time [place]: 1401 Representative spoke with at [place]: [place] ([place]) Interventions     Readmission Risk Interventions Readmission Risk Prevention Plan 04/01/2020  Post Dischage Appt Complete  Medication Screening Complete  Transportation Screening Complete  Some recent data might be hidden

## 2020-04-09 DIAGNOSIS — Z8673 Personal history of transient ischemic attack (TIA), and cerebral infarction without residual deficits: Secondary | ICD-10-CM | POA: Diagnosis not present

## 2020-04-09 DIAGNOSIS — I739 Peripheral vascular disease, unspecified: Secondary | ICD-10-CM | POA: Diagnosis not present

## 2020-04-09 DIAGNOSIS — I70213 Atherosclerosis of native arteries of extremities with intermittent claudication, bilateral legs: Secondary | ICD-10-CM | POA: Diagnosis not present

## 2020-04-09 DIAGNOSIS — T8149XA Infection following a procedure, other surgical site, initial encounter: Secondary | ICD-10-CM | POA: Diagnosis not present

## 2020-04-09 DIAGNOSIS — I509 Heart failure, unspecified: Secondary | ICD-10-CM | POA: Diagnosis not present

## 2020-04-09 DIAGNOSIS — F418 Other specified anxiety disorders: Secondary | ICD-10-CM | POA: Diagnosis not present

## 2020-04-09 DIAGNOSIS — I110 Hypertensive heart disease with heart failure: Secondary | ICD-10-CM | POA: Diagnosis not present

## 2020-04-09 LAB — BPAM RBC
Blood Product Expiration Date: 202106142359
Blood Product Expiration Date: 202106142359
Unit Type and Rh: 5100
Unit Type and Rh: 5100

## 2020-04-09 LAB — TYPE AND SCREEN
ABO/RH(D): O POS
Antibody Screen: NEGATIVE
Unit division: 0
Unit division: 0

## 2020-04-11 ENCOUNTER — Ambulatory Visit (INDEPENDENT_AMBULATORY_CARE_PROVIDER_SITE_OTHER): Payer: Self-pay | Admitting: Surgery

## 2020-04-11 ENCOUNTER — Encounter: Payer: Self-pay | Admitting: Surgery

## 2020-04-11 ENCOUNTER — Other Ambulatory Visit: Payer: Self-pay

## 2020-04-11 VITALS — BP 157/77 | HR 73 | Temp 97.4°F | Resp 20 | Ht 63.0 in | Wt 147.0 lb

## 2020-04-11 DIAGNOSIS — I110 Hypertensive heart disease with heart failure: Secondary | ICD-10-CM | POA: Diagnosis not present

## 2020-04-11 DIAGNOSIS — I70213 Atherosclerosis of native arteries of extremities with intermittent claudication, bilateral legs: Secondary | ICD-10-CM

## 2020-04-11 DIAGNOSIS — F418 Other specified anxiety disorders: Secondary | ICD-10-CM | POA: Diagnosis not present

## 2020-04-11 DIAGNOSIS — T8149XA Infection following a procedure, other surgical site, initial encounter: Secondary | ICD-10-CM | POA: Diagnosis not present

## 2020-04-11 DIAGNOSIS — I739 Peripheral vascular disease, unspecified: Secondary | ICD-10-CM | POA: Diagnosis not present

## 2020-04-11 DIAGNOSIS — I509 Heart failure, unspecified: Secondary | ICD-10-CM | POA: Diagnosis not present

## 2020-04-11 MED ORDER — SULFAMETHOXAZOLE-TRIMETHOPRIM 400-80 MG PO TABS
1.0000 | ORAL_TABLET | Freq: Two times a day (BID) | ORAL | 0 refills | Status: DC
Start: 2020-04-11 — End: 2020-04-28

## 2020-04-11 NOTE — Progress Notes (Signed)
Patient name: Roy [name] MRN: [id number] DOB: 11-25-1946 Sex: male  REASON FOR VISIT:    Postop  HISTORY OF PRESENT ILLNESS:   Roy [name] is a 74 y.o. male who is status post right iliofemoral endarterectomy with patch angioplasty as well as common iliac and superficial femoral artery stenting.  This was performed on 03/31/2020.  He was seen in the office on 04/06/2020 with redness from his incision.  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  This was treated with antibiotic beads and a wound VAC.  Cultures were negative.  CURRENT MEDICATIONS:    Current Outpatient Medications  Medication Sig Dispense Refill  . acetaminophen (TYLENOL) 500 MG tablet Take 1,000 mg by mouth as needed for moderate pain or headache.     [name] aspirin EC 81 MG tablet Take 81 mg by mouth every evening.     [name] atorvastatin (LIPITOR) 20 MG tablet Take 20 mg by mouth daily.     [name] BRILINTA 90 MG TABS tablet Take 90 mg by mouth 2 (two) times daily.    . carvedilol (COREG) 3.125 MG tablet TAKE 1 TABLET TWICE A DAY (Patient taking differently: Take 3.125 mg by mouth 2 (two) times daily with a meal. ) 180 tablet 0  . cephALEXin (KEFLEX) 500 MG capsule Take 1 capsule (500 mg total) by mouth 2 (two) times daily for 7 days. 14 capsule 0  . dextromethorphan-guaiFENesin (MUCINEX DM) 30-600 MG 12hr tablet Take 1 tablet by mouth daily.     [name] ENTRESTO 49-51 MG TAKE 1 TABLET TWICE A DAY (Patient taking differently: Take 1 tablet by mouth 2 (two) times daily. ) 180 tablet 4  . gabapentin (NEURONTIN) 300 MG capsule Take 300 mg by mouth 2 (two) times daily.     . magnesium oxide (MAG-OX) 400 MG tablet Take 400 mg by mouth daily.    . Multiple Vitamins-Minerals (CENTRUM SILVER PO) Take 1 tablet by mouth daily. 50+    . omega-3 acid ethyl esters (LOVAZA) 1 g capsule Take 1 g by mouth daily.     [name] oxyCODONE-acetaminophen (PERCOCET) 10-325 MG tablet Take 1  tablet by mouth every 6 (six) hours as needed for pain. 20 tablet 0  . SUPER B COMPLEX/C PO Take 1 tablet by mouth at bedtime.     . tamsulosin (FLOMAX) 0.4 MG CAPS capsule Take 0.4 mg by mouth 2 (two) times daily.      Current Facility-Administered Medications  Medication Dose Route Frequency Provider Last Rate Last Admin  . sulfamethoxazole-trimethoprim (BACTRIM DS) 800-160 MG per tablet 1 tablet  1 tablet Oral Q12H Setzer, Ed[address] Circle, PA-C        REVIEW OF SYSTEMS:   [X]  denotes positive finding, [ ]  denotes negative finding Cardiac  Comments:  Chest pain or chest pressure:    Shortness of breath upon exertion:    Short of breath when lying flat:    Irregular heart rhythm:    Constitutional    Fever or chills:      PHYSICAL EXAM:   Vitals:   04/11/20 1211  BP: (!) 157/77  Pulse: 73  Resp: 20  Temp: (!) 97.4 F (36.3 C)  SpO2: 97%  Weight: 147 lb (66.7 kg)  Height: 5\' 3"  (1.6 m)    GENERAL: The patient is a well-nourished male, in no acute distress. The vital signs are documented above. CARDIOVASCULAR: There is a regular rate and rhythm. PULMONARY: Non-labored respirations Wound  VAC was removed.  He had residual large volume lymphatic fluid.  I further opened his incision so as to get a larger sponge on the wound VAC  STUDIES:      MEDICAL ISSUES:   The patient will have his wound VAC placed later today.  Again I opened the wound larger so that a bigger sponge could be placed which will hopefully help evacuate more of his lymphatic fluid.  I Georgina Peer continue him on antibiotics and see him back in a week.  Leia Alf, MD, FACS [facility] [phone] Pager [phone]

## 2020-04-12 LAB — AEROBIC/ANAEROBIC CULTURE W GRAM STAIN (SURGICAL/DEEP WOUND): Culture: NO GROWTH

## 2020-04-13 DIAGNOSIS — F418 Other specified anxiety disorders: Secondary | ICD-10-CM | POA: Diagnosis not present

## 2020-04-13 DIAGNOSIS — T8149XA Infection following a procedure, other surgical site, initial encounter: Secondary | ICD-10-CM | POA: Diagnosis not present

## 2020-04-13 DIAGNOSIS — I70213 Atherosclerosis of native arteries of extremities with intermittent claudication, bilateral legs: Secondary | ICD-10-CM | POA: Diagnosis not present

## 2020-04-13 DIAGNOSIS — I739 Peripheral vascular disease, unspecified: Secondary | ICD-10-CM | POA: Diagnosis not present

## 2020-04-13 DIAGNOSIS — I110 Hypertensive heart disease with heart failure: Secondary | ICD-10-CM | POA: Diagnosis not present

## 2020-04-13 DIAGNOSIS — I509 Heart failure, unspecified: Secondary | ICD-10-CM | POA: Diagnosis not present

## 2020-04-15 ENCOUNTER — Telehealth (HOSPITAL_COMMUNITY): Payer: Self-pay

## 2020-04-15 DIAGNOSIS — T8149XA Infection following a procedure, other surgical site, initial encounter: Secondary | ICD-10-CM | POA: Diagnosis not present

## 2020-04-15 DIAGNOSIS — I509 Heart failure, unspecified: Secondary | ICD-10-CM | POA: Diagnosis not present

## 2020-04-15 DIAGNOSIS — I70213 Atherosclerosis of native arteries of extremities with intermittent claudication, bilateral legs: Secondary | ICD-10-CM | POA: Diagnosis not present

## 2020-04-15 DIAGNOSIS — I739 Peripheral vascular disease, unspecified: Secondary | ICD-10-CM | POA: Diagnosis not present

## 2020-04-15 DIAGNOSIS — F418 Other specified anxiety disorders: Secondary | ICD-10-CM | POA: Diagnosis not present

## 2020-04-15 DIAGNOSIS — I110 Hypertensive heart disease with heart failure: Secondary | ICD-10-CM | POA: Diagnosis not present

## 2020-04-15 NOTE — Telephone Encounter (Signed)

## 2020-04-16 DIAGNOSIS — T8149XA Infection following a procedure, other surgical site, initial encounter: Secondary | ICD-10-CM | POA: Diagnosis not present

## 2020-04-16 DIAGNOSIS — I739 Peripheral vascular disease, unspecified: Secondary | ICD-10-CM | POA: Diagnosis not present

## 2020-04-16 DIAGNOSIS — I110 Hypertensive heart disease with heart failure: Secondary | ICD-10-CM | POA: Diagnosis not present

## 2020-04-16 DIAGNOSIS — I70213 Atherosclerosis of native arteries of extremities with intermittent claudication, bilateral legs: Secondary | ICD-10-CM | POA: Diagnosis not present

## 2020-04-16 DIAGNOSIS — F418 Other specified anxiety disorders: Secondary | ICD-10-CM | POA: Diagnosis not present

## 2020-04-16 DIAGNOSIS — I509 Heart failure, unspecified: Secondary | ICD-10-CM | POA: Diagnosis not present

## 2020-04-18 ENCOUNTER — Other Ambulatory Visit: Payer: Self-pay

## 2020-04-18 ENCOUNTER — Encounter: Payer: Self-pay | Admitting: Surgery

## 2020-04-18 ENCOUNTER — Ambulatory Visit (INDEPENDENT_AMBULATORY_CARE_PROVIDER_SITE_OTHER): Payer: Self-pay | Admitting: Surgery

## 2020-04-18 VITALS — BP 135/69 | HR 60 | Temp 97.3°F | Resp 20 | Ht 63.0 in | Wt 145.0 lb

## 2020-04-18 DIAGNOSIS — T8149XA Infection following a procedure, other surgical site, initial encounter: Secondary | ICD-10-CM | POA: Diagnosis not present

## 2020-04-18 DIAGNOSIS — I509 Heart failure, unspecified: Secondary | ICD-10-CM | POA: Diagnosis not present

## 2020-04-18 DIAGNOSIS — I739 Peripheral vascular disease, unspecified: Secondary | ICD-10-CM | POA: Diagnosis not present

## 2020-04-18 DIAGNOSIS — I70213 Atherosclerosis of native arteries of extremities with intermittent claudication, bilateral legs: Secondary | ICD-10-CM | POA: Diagnosis not present

## 2020-04-18 DIAGNOSIS — F418 Other specified anxiety disorders: Secondary | ICD-10-CM | POA: Diagnosis not present

## 2020-04-18 DIAGNOSIS — I110 Hypertensive heart disease with heart failure: Secondary | ICD-10-CM | POA: Diagnosis not present

## 2020-04-18 NOTE — Progress Notes (Signed)
Patient name: Roy [name] MRN: [id number] DOB: 02-04-1946 Sex: male  REASON FOR VISIT:     post op  HISTORY OF PRESENT ILLNESS:   Roy [name] is a 74 y.o. male who is status post right iliofemoral endarterectomy with patch angioplasty as well as common iliac and superficial femoral artery stenting.  This was performed on 03/31/2020.  He was seen in the office on 04/06/2020 with redness from his incision.  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  This was treated with antibiotic beads and a wound VAC.  Cultures were negative.  CURRENT MEDICATIONS:    Current Outpatient Medications  Medication Sig Dispense Refill  . acetaminophen (TYLENOL) 500 MG tablet Take 1,000 mg by mouth as needed for moderate pain or headache.     [name] aspirin EC 81 MG tablet Take 81 mg by mouth every evening.     [name] atorvastatin (LIPITOR) 20 MG tablet Take 20 mg by mouth daily.     [name] BRILINTA 90 MG TABS tablet Take 90 mg by mouth 2 (two) times daily.    . carvedilol (COREG) 3.125 MG tablet TAKE 1 TABLET TWICE A DAY (Patient taking differently: Take 3.125 mg by mouth 2 (two) times daily with a meal. ) 180 tablet 0  . dextromethorphan-guaiFENesin (MUCINEX DM) 30-600 MG 12hr tablet Take 1 tablet by mouth daily.     [name] ENTRESTO 49-51 MG TAKE 1 TABLET TWICE A DAY (Patient taking differently: Take 1 tablet by mouth 2 (two) times daily. ) 180 tablet 4  . gabapentin (NEURONTIN) 300 MG capsule Take 300 mg by mouth 2 (two) times daily.     . magnesium oxide (MAG-OX) 400 MG tablet Take 400 mg by mouth daily.    . Multiple Vitamins-Minerals (CENTRUM SILVER PO) Take 1 tablet by mouth daily. 50+    . omega-3 acid ethyl esters (LOVAZA) 1 g capsule Take 1 g by mouth daily.     [name] oxyCODONE-acetaminophen (PERCOCET) 10-325 MG tablet Take 1 tablet by mouth every 6 (six) hours as needed for pain. 20 tablet 0  . sulfamethoxazole-trimethoprim (BACTRIM) 400-80  MG tablet Take 1 tablet by mouth 2 (two) times daily. 30 tablet 0  . SUPER B COMPLEX/C PO Take 1 tablet by mouth at bedtime.     . tamsulosin (FLOMAX) 0.4 MG CAPS capsule Take 0.4 mg by mouth 2 (two) times daily.      Current Facility-Administered Medications  Medication Dose Route Frequency Provider Last Rate Last Admin  . sulfamethoxazole-trimethoprim (BACTRIM DS) 800-160 MG per tablet 1 tablet  1 tablet Oral Q12H Setzer, Edman Circle, PA-C        REVIEW OF SYSTEMS:   [X]  denotes positive finding, [ ]  denotes negative finding Cardiac  Comments:  Chest pain or chest pressure:    Shortness of breath upon exertion:    Short of breath when lying flat:    Irregular heart rhythm:    Constitutional    Fever or chills:      PHYSICAL EXAM:   Vitals:   04/18/20 1040  BP: 135/69  Pulse: 60  Resp: 20  Temp: (!) 97.3 F (36.3 C)  SpO2: 98%  Weight: 145 lb (65.8 kg)  Height: 5\' 3"  (1.6 m)    GENERAL: The patient is a well-nourished male, in no acute distress. The vital signs are documented above. CARDIOVASCULAR: There is a regular rate and rhythm. PULMONARY: Non-labored respirations Wound VAC was changed today.  The incision is healing nicely however at the inferior aspect there is still a rather large cavity with lymphatic fluid  STUDIES:      MEDICAL ISSUES:   I packed his wound today with 4 x 4's and told the patient to instruct the wound nurses to get a piece of spines into the area in the distal incision where it has a large cavity.  I will have him back in 2 weeks for wound check.  Leia Alf, MD, FACS [facility] [phone] Pager [phone]

## 2020-04-19 ENCOUNTER — Encounter: Payer: Self-pay | Admitting: Gastroenterology

## 2020-04-20 DIAGNOSIS — T8149XA Infection following a procedure, other surgical site, initial encounter: Secondary | ICD-10-CM | POA: Diagnosis not present

## 2020-04-20 DIAGNOSIS — I509 Heart failure, unspecified: Secondary | ICD-10-CM | POA: Diagnosis not present

## 2020-04-20 DIAGNOSIS — I70213 Atherosclerosis of native arteries of extremities with intermittent claudication, bilateral legs: Secondary | ICD-10-CM | POA: Diagnosis not present

## 2020-04-20 DIAGNOSIS — I739 Peripheral vascular disease, unspecified: Secondary | ICD-10-CM | POA: Diagnosis not present

## 2020-04-20 DIAGNOSIS — I110 Hypertensive heart disease with heart failure: Secondary | ICD-10-CM | POA: Diagnosis not present

## 2020-04-20 DIAGNOSIS — F418 Other specified anxiety disorders: Secondary | ICD-10-CM | POA: Diagnosis not present

## 2020-04-22 DIAGNOSIS — F418 Other specified anxiety disorders: Secondary | ICD-10-CM | POA: Diagnosis not present

## 2020-04-22 DIAGNOSIS — I739 Peripheral vascular disease, unspecified: Secondary | ICD-10-CM | POA: Diagnosis not present

## 2020-04-22 DIAGNOSIS — I70213 Atherosclerosis of native arteries of extremities with intermittent claudication, bilateral legs: Secondary | ICD-10-CM | POA: Diagnosis not present

## 2020-04-22 DIAGNOSIS — I509 Heart failure, unspecified: Secondary | ICD-10-CM | POA: Diagnosis not present

## 2020-04-22 DIAGNOSIS — T8149XA Infection following a procedure, other surgical site, initial encounter: Secondary | ICD-10-CM | POA: Diagnosis not present

## 2020-04-22 DIAGNOSIS — I110 Hypertensive heart disease with heart failure: Secondary | ICD-10-CM | POA: Diagnosis not present

## 2020-04-26 DIAGNOSIS — I70213 Atherosclerosis of native arteries of extremities with intermittent claudication, bilateral legs: Secondary | ICD-10-CM | POA: Diagnosis not present

## 2020-04-26 DIAGNOSIS — I509 Heart failure, unspecified: Secondary | ICD-10-CM | POA: Diagnosis not present

## 2020-04-26 DIAGNOSIS — I739 Peripheral vascular disease, unspecified: Secondary | ICD-10-CM | POA: Diagnosis not present

## 2020-04-26 DIAGNOSIS — F418 Other specified anxiety disorders: Secondary | ICD-10-CM | POA: Diagnosis not present

## 2020-04-26 DIAGNOSIS — T8149XA Infection following a procedure, other surgical site, initial encounter: Secondary | ICD-10-CM | POA: Diagnosis not present

## 2020-04-26 DIAGNOSIS — I110 Hypertensive heart disease with heart failure: Secondary | ICD-10-CM | POA: Diagnosis not present

## 2020-04-28 ENCOUNTER — Ambulatory Visit (INDEPENDENT_AMBULATORY_CARE_PROVIDER_SITE_OTHER): Payer: Medicare Other | Admitting: Nurse Practitioner

## 2020-04-28 ENCOUNTER — Other Ambulatory Visit: Payer: Self-pay

## 2020-04-28 ENCOUNTER — Encounter: Payer: Self-pay | Admitting: Nurse Practitioner

## 2020-04-28 VITALS — BP 131/65 | HR 64 | Temp 96.6°F | Ht 63.0 in | Wt 146.2 lb

## 2020-04-28 DIAGNOSIS — K7030 Alcoholic cirrhosis of liver without ascites: Secondary | ICD-10-CM

## 2020-04-28 DIAGNOSIS — I779 Disorder of arteries and arterioles, unspecified: Secondary | ICD-10-CM

## 2020-04-28 DIAGNOSIS — F1021 Alcohol dependence, in remission: Secondary | ICD-10-CM

## 2020-04-28 NOTE — Assessment & Plan Note (Signed)
Previously with alcohol dependence and possible admission in 2009 for withdrawal.  He quit drinking alcohol in 2015.  Since then he has had improvement in his liver disease as per above.  Recommend he continue to abstain from alcohol.  Follow-up in 1 year.

## 2020-04-28 NOTE — Assessment & Plan Note (Signed)
Noted previous indication of cirrhosis with ultrasound elastography with Matavir F1-F2 but nodular liver border on imaging.  This is likely due to chronic alcohol use earlier in life.  Roy [name] has since stopped drinking alcohol in 2015.  Since then his liver appears with regenerated.  Roy [name] does now have F0 to F1 Matavir score on ultrasound elastography in 2019.  Platelet count normal.  Meld score has consistently been 6.  No further mention of nodular liver border on imaging.  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  We will plan to see him once a year for updated labs and imaging and this is for hepatoma screening given previous significant scarring.  Again recommended continue abstain from alcohol as Roy [name] has gone for 6 years.  Call for any worsening symptoms, follow-up in 1 year.

## 2020-04-28 NOTE — Progress Notes (Signed)
Referring Provider: Celene Squibb, MD Primary Care Physician:  Celene Squibb, MD Primary GI:  Dr. Gala Romney (in the absence of Dr. Oneida Alar); pending Dr. Abbey Chatters  Chief Complaint  Patient presents with   Cirrhosis    HPI:   Roy [name] is a 74 y.o. male who presents for follow-up on cirrhosis.  The patient was last seen in our office [id number] for alcoholic cirrhosis.  He quit drinking alcohol in 2015, historically liver disease well compensated.  EGD in 2015 with no varices or portal gastropathy.  Right upper quadrant ultrasound April 2020 shows the liver to be within normal limits.  Meld score 6.  Imaging 2015/2016 the ultrasound indicated nodular liver border.  Feels well.  Recent labs with normal LFTs.  Due to normal platelet count, elastography recently showing minimal fibrosis deemed likely regeneration of liver tissue and no need for 10-month follow-up.  Recommended follow-up in 1 year.  No need for repeat imaging unless patient resumes alcohol intake.  Repeat labs completed 11/08/2019 which showed essentially normal CBC, CMP, INR.  Hepatitis A, B, C serologies were normal.  Today he states he is doing well overall.  Had recent vascular surgery in his right groin area, now with a wound vac x last 2 weeks and hoping it will come off next week. Overall feels great! Denies abdominal pain, nausea, vomiting, hematochezia, melena, fever, chills, unintentional weight loss. Denies any yellowing of the skin/eyes, darkened urine, acute episodic confusion, generalized tremors, generalized pruritis. Denies URI or flu-like symptoms. Denies loss of sense of taste or smell. The patient has received COVID-19 vaccination(s). Denies chest pain, dyspnea, dizziness, lightheadedness, syncope, near syncope. Denies any other upper or lower GI symptoms.  Total time spent on the patient's care today, including face-to-face time: 30 minutes  Past Medical History:  Diagnosis Date   AICD (automatic  cardioverter/defibrillator) present 08/19/2015   [employer] Jude BiV ICD for primary prevention by Dr. Lovena Le   Alcohol abuse    6 beers per day; hospital admission in 2009 for withdrawal symptoms   Alcoholic cirrhosis ([place])    Anxiety and depression    denies    Cerebrovascular disease 2009   TIA; 2009- right ICA stent; re-intervention for restenosis complicated by [facility] w/o sx   CHF (congestive heart failure) ([place]) 06-02-14   Chronic obstructive pulmonary disease (HCC)    Degenerative joint disease    Total shoulder arthroplasty-right   Hyperlipidemia    Hypertension    LBBB (left bundle branch block)    Normal echo-2011; stress nuclear in 09/2010--septal hypoperfusion representing nontransmural infarction or the effect of left bundle branch block, no ischemia   Myocardial infarction [facility]) June 02, 2014   Massive Heart Attack   Peripheral vascular disease ([place])    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   Thrombocytopenia (HCC)    Tobacco abuse    -100 pack years; 1.5 packs per day   Traumatic seroma of thigh (HCC)    left   Tubular adenoma of colon     Past Surgical History:  Procedure Laterality Date   ABDOMINAL AORTAGRAM [name] 05/04/2014   Procedure: ABDOMINAL Maxcine Ham;  Surgeon: [name], MD;  Location: [facility] CATH LAB;  Service: Cardiovascular;  Laterality: [name];   ABDOMINAL AORTOGRAM [name] 06/25/2017   Procedure: Abdominal Aortogram;  Surgeon: [name], MD;  Location: [place] CV LAB;  Service: Cardiovascular;  Laterality: [name];   ABDOMINAL AORTOGRAM W/LOWER EXTREMITY [name] 04/22/2018   Procedure: ABDOMINAL AORTOGRAM W/LOWER  EXTREMITY;  Surgeon: [name], MD;  Location: [place] CV LAB;  Service: Cardiovascular;  Laterality: [name];   ABDOMINAL AORTOGRAM W/LOWER EXTREMITY [name] 03/08/2020   Procedure: ABDOMINAL AORTOGRAM W/LOWER EXTREMITY;  Surgeon: [name], MD;  Location: [place] CV LAB;  Service: Cardiovascular;  Laterality: [name];     AGILE CAPSULE [name] 03/18/2014   Procedure: AGILE CAPSULE;  Surgeon: Danie Binder, MD;  [facility];  Service: Endoscopy;  Laterality: [name];  [id number]   APPLICATION OF WOUND VAC Left 09/03/2017   Procedure: APPLICATION OF WOUND VAC;  Surgeon: Conrad Northfield, MD;  Location: [place];  Service: Vascular;  Laterality: Left;   APPLICATION OF WOUND VAC Right 04/07/2020   Procedure: PLACEMENT OF ANTIBIOTIC BEADS AND APPLICATION OF WOUND VAC;  Surgeon: [name], MD;  [facility] OR;  Service: Vascular;  Laterality: Right;   BACK SURGERY     BACTERIAL OVERGROWTH TEST [name] 05/24/2015   Procedure: BACTERIAL OVERGROWTH TEST;  Surgeon: Danie Binder, MD;  [facility];  Service: Endoscopy;  Laterality: [name];  0700   BI-VENTRICULAR IMPLANTABLE CARDIOVERTER DEFIBRILLATOR  (CRT-D)  08/19/2015   CARPAL TUNNEL RELEASE Left 02/02/2016   Procedure: LEFT CARPAL TUNNEL RELEASE;  Surgeon: Daryll Brod, MD;  Location: [place];  Service: Orthopedics;  Laterality: Left;  ANESTHESIA: IV REGIONAL UPPER ARM   CATARACT EXTRACTION W/PHACO Right 03/08/2015   Procedure: CATARACT EXTRACTION PHACO AND INTRAOCULAR LENS PLACEMENT (IOC);  Surgeon: Rutherford Guys, MD;  Location: [facility];  Service: Ophthalmology;  Laterality: Right;  CDE:9.46   CATARACT EXTRACTION W/PHACO Left 03/22/2015   Procedure: CATARACT EXTRACTION PHACO AND INTRAOCULAR LENS PLACEMENT (IOC);  Surgeon: Rutherford Guys, MD;  Location: [facility];  Service: Ophthalmology;  Laterality: Left;  CDE:5.80   COLONOSCOPY  08/22/09   Fields-(Tubular Adenoma)3-mm transverse polyp/4-mm polyp otherwise noraml/small internal hemorrhoids   COLONOSCOPY [name] 04/14/2014   [id number] internal hemorrhids/normal mocsa in the terminal iluem/left colonis redundant   COLONOSCOPY W/ POLYPECTOMY  2011   ENDARTERECTOMY FEMORAL Left 08/16/2017   Procedure: ENDARTERECTOMY LEFT PROFUNDA FEMORAL;  Surgeon: [name], MD;  Location: [facility] OR;  Service:  Vascular;  Laterality: Left;   ENDARTERECTOMY FEMORAL Right 03/31/2020   Procedure: RIGHT FEMORAL ENDARTERECTOMY;  Surgeon: [name], MD;  Location: [place];  Service: Vascular;  Laterality: Right;   EP IMPLANTABLE DEVICE [name] 08/19/2015   Procedure: BiV ICD Insertion CRT-D;  Surgeon: Evans Lance, MD;  Location: [place] CV LAB;  Service: Cardiovascular;  Laterality: [name];   ESOPHAGOGASTRODUODENOSCOPY [name] 03/05/2014   SLF: 1. Stricture at the gastroesophagael junction 2. Small hiatal hernia 3. Moderate non-erosive gastritis and duodentitis. 4. No surce for Melena identified.    FEMORAL-POPLITEAL BYPASS GRAFT Left 08/16/2017   Procedure: LEFT  FEMORAL-POPLITEAL ARTERY  BYPASS GRAFT;  Surgeon: [name], MD;  Location: [place];  Service: Vascular;  Laterality: Left;   GIVENS CAPSULE STUDY [name] 03/30/2014   Procedure: GIVENS CAPSULE STUDY;  Surgeon: Danie Binder, MD;  [facility];  Service: Endoscopy;  Laterality: [name];  7:30   GROIN DEBRIDEMENT Right 04/07/2020   Procedure: INCISION AND DRAINAGE OF RIGHT GROIN;  Surgeon: [name], MD;  Location: [place];  Service: Vascular;  Laterality: Right;   I & D EXTREMITY Left 09/03/2017   Procedure: IRRIGATION AND DEBRIDEMENT EXTREMITY LEFT THIGH SEROMA;  Surgeon: Conrad Brookdale, MD;  Location: [place];  Service: Vascular;  Laterality: Left;   INSERT /  REPLACE / REMOVE PACEMAKER     INSERTION OF ILIAC STENT  03/31/2020   Procedure: INSERTION OF RIGHT ILIAC ARTERY STENT AND RIGHT SUPERFICIAL FEMORAL ARTERY STENT;  Surgeon: [name], MD;  [facility];  Service: Vascular;;   IR ANGIO INTRA EXTRACRAN SEL COM CAROTID INNOMINATE BILAT MOD SED  07/16/2017   IR ANGIO INTRA EXTRACRAN SEL COM CAROTID INNOMINATE BILAT MOD SED  06/12/2019   IR ANGIO VERTEBRAL SEL SUBCLAVIAN INNOMINATE BILAT MOD SED  07/16/2017   IR ANGIO VERTEBRAL SEL SUBCLAVIAN INNOMINATE BILAT MOD SED  06/12/2019   IR RADIOLOGIST EVAL & MGMT  04/01/2017   IR  RADIOLOGIST EVAL & MGMT  08/02/2017   IR TRANSCATH EXCRAN VERT OR CAR A STENT  07/22/2019   IR US GUIDE VASC ACCESS RIGHT  06/12/2019   JOINT REPLACEMENT Right    Total Shoulder Replacement    LOWER EXTREMITY ANGIOGRAPHY Bilateral 06/25/2017   Procedure: Lower Extremity Angiography;  Surgeon: [name], MD;  Location: [place] CV LAB;  Service: Cardiovascular;  Laterality: Bilateral;   LUMBAR FUSION  2010   PATCH ANGIOPLASTY Right 03/31/2020   Procedure: PATCH ANGIOPLASTY USING XENOSURE BIOLOGIC PATCH 1cm x 14cm;  Surgeon: [name], MD;  Location: [facility] OR;  Service: Vascular;  Laterality: Right;   PERIPHERAL VASCULAR ATHERECTOMY Right 04/22/2018   Procedure: PERIPHERAL VASCULAR ATHERECTOMY;  Surgeon: [name], MD;  Location: [place] CV LAB;  Service: Cardiovascular;  Laterality: Right;  right superficial femoral   PERIPHERAL VASCULAR BALLOON ANGIOPLASTY Right 04/22/2018   Procedure: PERIPHERAL VASCULAR BALLOON ANGIOPLASTY;  Surgeon: [name], MD;  Location: [place] CV LAB;  Service: Cardiovascular;  Laterality: Right;  external iliac   PERIPHERAL VASCULAR BALLOON ANGIOPLASTY Left 03/08/2020   Procedure: PERIPHERAL VASCULAR BALLOON ANGIOPLASTY;  Surgeon: [name], MD;  Location: [place] CV LAB;  Service: Cardiovascular;  Laterality: Left;  Common Femoral   PERIPHERAL VASCULAR INTERVENTION Right 03/08/2020   Procedure: PERIPHERAL VASCULAR INTERVENTION;  Surgeon: [name], MD;  Location: [place] CV LAB;  Service: Cardiovascular;  Laterality: Right;  SFA   RADIOLOGY WITH ANESTHESIA [name] 07/01/2019   Procedure: STENTING;  Surgeon: Luanne Bras, MD;  Location: [place];  Service: Radiology;  Laterality: [name];   RADIOLOGY WITH ANESTHESIA [name] 07/22/2019   Procedure: STENTING;  Surgeon: Luanne Bras, MD;  Location: [place];  Service: Radiology;  Laterality: [name];   TOTAL SHOULDER ARTHROPLASTY Right 2011   Dr. Rafael Bihari NERVE  TRANSPOSITION Left 02/02/2016   Procedure: LEFT IN-SITU DECOMPRESSION ULNAR NERVE ;  Surgeon: Daryll Brod, MD;  Location: [place];  Service: Orthopedics;  Laterality: Left;   ULNAR TUNNEL RELEASE Left 02/02/2016   Procedure: LEFT CUBITAL TUNNEL RELEASE;  Surgeon: Daryll Brod, MD;  Location: [place];  Service: Orthopedics;  Laterality: Left;   VASECTOMY  1971   VEIN HARVEST Left 08/16/2017   Procedure: USING NON REVERSE LEFT GREATER SAPHENOUS VEIN HARVEST;  Surgeon: [name], MD;  [facility];  Service: Vascular;  Laterality: Left;    Current Outpatient Medications  Medication Sig Dispense Refill   acetaminophen (TYLENOL) 500 MG tablet Take 1,000 mg by mouth as needed for moderate pain or headache.      aspirin EC 81 MG tablet Take 81 mg by mouth every evening.      atorvastatin (LIPITOR) 20 MG tablet Take 20 mg by mouth daily.      BRILINTA 90 MG TABS  tablet Take 90 mg by mouth 2 (two) times daily.     carvedilol (COREG) 3.125 MG tablet TAKE 1 TABLET TWICE A DAY (Patient taking differently: Take 3.125 mg by mouth 2 (two) times daily with a meal. ) 180 tablet 0   dextromethorphan-guaiFENesin (MUCINEX DM) 30-600 MG 12hr tablet Take 1 tablet by mouth daily.      ENTRESTO 49-51 MG TAKE 1 TABLET TWICE A DAY (Patient taking differently: Take 1 tablet by mouth 2 (two) times daily. ) 180 tablet 4   gabapentin (NEURONTIN) 300 MG capsule Take 300 mg by mouth 2 (two) times daily.      magnesium oxide (MAG-OX) 400 MG tablet Take 400 mg by mouth daily.     Multiple Vitamins-Minerals (CENTRUM SILVER PO) Take 1 tablet by mouth daily. 50+     omega-3 acid ethyl esters (LOVAZA) 1 g capsule Take 1 g by mouth daily.      SUPER B COMPLEX/C PO Take 1 tablet by mouth at bedtime.      tamsulosin (FLOMAX) 0.4 MG CAPS capsule Take 0.4 mg by mouth 2 (two) times daily.      traMADol (ULTRAM) 50 MG tablet Take 50 mg by mouth as needed.     No current  facility-administered medications for this visit.    Allergies as of 04/28/2020   (No Known Allergies)    Family History  Problem Relation Age of Onset   Coronary artery disease Mother    Diabetes Mother    Heart disease Mother        Before age 20 - 62 Bypasses   Hypertension Mother    Heart attack Mother        3-4 Heart attacks   Alzheimer's disease Father    Diabetes Father    Heart attack Sister    Cancer Brother        Prostate   Hyperlipidemia Son    Prostate cancer Brother    Alzheimer's disease Sister    Colon cancer Neg Hx     Social History   Socioeconomic History   Marital status: Married    Spouse name: Not on file   Number of children: 2   Years of education: Not on file   Highest education level: Not on file  Occupational History   Occupation: retired, Clinical biochemist    Comment: Programmer, systems: RETIRED  Tobacco Use   Smoking status: Current Every Day Smoker    Packs/day: 1.00    Years: 60.00    Pack years: 60.00    Types: Cigarettes    Start date: 08/20/1957   Smokeless tobacco: Never Used  Substance and Sexual Activity   Alcohol use: No    Alcohol/week: 0.0 standard drinks    Comment: quit in July 2015   Drug use: No   Sexual activity: Not Currently  Other Topics Concern   Not on file  Social History Narrative   Accompanied by daughter [name]   Lives w/ wife, daughter   Social Determinants of Health   Financial Resource Strain:    Difficulty of Paying Living Expenses:   Food Insecurity:    Worried About Charity fundraiser in the Last Year:    Arboriculturist in the Last Year:   Transportation Needs:    Film/video editor (Medical):    Lack of Transportation (Non-Medical):   Physical Activity:    Days of Exercise per Week:    Minutes of Exercise per Session:  Stress:    Feeling of Stress :   Social Connections:    Frequency of Communication with Friends and Family:     Frequency of Social Gatherings with Friends and Family:    Attends Religious Services:    Active Member of Clubs or Organizations:    Attends Music therapist:    Marital Status:     Subjective: Review of Systems  Constitutional: Negative for chills, fever, malaise/fatigue and weight loss.  HENT: Negative for congestion and sore throat.   Respiratory: Negative for cough and shortness of breath.   Cardiovascular: Negative for chest pain and palpitations.  Gastrointestinal: Negative for abdominal pain, blood in stool, diarrhea, melena, nausea and vomiting.  Musculoskeletal: Negative for joint pain and myalgias.  Skin: Negative for rash.  Neurological: Negative for dizziness and weakness.  Endo/Heme/Allergies: Does not bruise/bleed easily.  Psychiatric/Behavioral: Negative for depression. The patient is not nervous/anxious.   All other systems reviewed and are negative.    Objective: BP 131/65    Pulse 64    Temp (!) 96.6 F (35.9 C) (Temporal)    Ht 5\' 3"  (1.6 m)    Wt 146 lb 3.2 oz (66.3 kg)    BMI 25.90 kg/m  Physical Exam Vitals and nursing note reviewed.  Constitutional:      General: He is not in acute distress.    Appearance: Normal appearance. He is not ill-appearing, toxic-appearing or diaphoretic.  HENT:     Head: Normocephalic and atraumatic.     Nose: No congestion or rhinorrhea.  Eyes:     General: No scleral icterus. Cardiovascular:     Rate and Rhythm: Normal rate and regular rhythm.     Heart sounds: Normal heart sounds.  Pulmonary:     Effort: Pulmonary effort is normal.     Breath sounds: Normal breath sounds.  Abdominal:     General: Bowel sounds are normal. There is no distension.     Palpations: Abdomen is soft. There is no hepatomegaly, splenomegaly or mass.     Tenderness: There is no abdominal tenderness. There is no guarding or rebound.     Hernia: No hernia is present.  Musculoskeletal:     Cervical back: Neck supple.  Skin:     General: Skin is warm and dry.     Coloration: Skin is not jaundiced.     Findings: No bruising or rash.  Neurological:     General: No focal deficit present.     Mental Status: He is alert and oriented to person, place, and time. Mental status is at baseline.  Psychiatric:        Mood and Affect: Mood normal.        Behavior: Behavior normal.        Thought Content: Thought content normal.       04/28/2020 10:42 AM   Disclaimer: This note was dictated with voice recognition software. Similar sounding words can inadvertently be transcribed and may not be corrected upon review.

## 2020-04-28 NOTE — Patient Instructions (Signed)
Your health issues we discussed today were:   Liver disease from previous alcohol consumption: 1. As we discussed, your moderate to significant scarring and bumpy edge of your liver appears to have improved to minimal scarring at this point 2. This is great news! 3. At this point we will plan to see you every year with updated labs and imaging 4. We do not need to do labs today because they were just completed last month 5. We will schedule an ultrasound of your liver for you 6. Call us if you have any worsening or significant symptoms  Overall I recommend:  1. Continue your other current medications 2. Return for follow-up 1 year 3. Call us for any questions or concerns   ---------------------------------------------------------------  I am glad you have gotten your COVID-19 vaccination!  Even though you are fully vaccinated you should continue to follow CDC and state/local guidelines.  ---------------------------------------------------------------   At [facility] we value your feedback. You may receive a survey about your visit today. Please share your experience as we strive to create trusting relationships with our patients to provide genuine, compassionate, quality care.  We appreciate your understanding and patience as we review any laboratory studies, imaging, and other diagnostic tests that are ordered as we care for you. Our office policy is 5 business days for review of these results, and any emergent or urgent results are addressed in a timely manner for your best interest. If you do not hear from our office in 1 week, please contact us.   We also encourage the use of MyChart, which contains your medical information for your review as well. If you are not enrolled in this feature, an access code is on this after visit summary for your convenience. Thank you for allowing [place] to be involved in your care.  It was great to see you today!  I hope you have a great  Summer!!

## 2020-04-29 DIAGNOSIS — I739 Peripheral vascular disease, unspecified: Secondary | ICD-10-CM | POA: Diagnosis not present

## 2020-04-29 DIAGNOSIS — T8149XA Infection following a procedure, other surgical site, initial encounter: Secondary | ICD-10-CM | POA: Diagnosis not present

## 2020-04-29 DIAGNOSIS — F418 Other specified anxiety disorders: Secondary | ICD-10-CM | POA: Diagnosis not present

## 2020-04-29 DIAGNOSIS — I110 Hypertensive heart disease with heart failure: Secondary | ICD-10-CM | POA: Diagnosis not present

## 2020-04-29 DIAGNOSIS — I509 Heart failure, unspecified: Secondary | ICD-10-CM | POA: Diagnosis not present

## 2020-04-29 DIAGNOSIS — I70213 Atherosclerosis of native arteries of extremities with intermittent claudication, bilateral legs: Secondary | ICD-10-CM | POA: Diagnosis not present

## 2020-05-02 ENCOUNTER — Ambulatory Visit (INDEPENDENT_AMBULATORY_CARE_PROVIDER_SITE_OTHER): Payer: Self-pay | Admitting: Surgery

## 2020-05-02 ENCOUNTER — Other Ambulatory Visit: Payer: Self-pay

## 2020-05-02 ENCOUNTER — Encounter: Payer: Self-pay | Admitting: Surgery

## 2020-05-02 VITALS — BP 171/69 | HR 63 | Temp 97.4°F | Resp 20 | Ht 63.0 in | Wt 146.0 lb

## 2020-05-02 DIAGNOSIS — I70213 Atherosclerosis of native arteries of extremities with intermittent claudication, bilateral legs: Secondary | ICD-10-CM

## 2020-05-02 DIAGNOSIS — T8142XA Infection following a procedure, deep incisional surgical site, initial encounter: Secondary | ICD-10-CM

## 2020-05-02 NOTE — Progress Notes (Signed)
Patient name: Roy [name] MRN: [id number] DOB: 08-29-1946 Sex: male  REASON FOR VISIT:     post op  HISTORY OF PRESENT ILLNESS:   [name] a 75 y.o.malewho is status post right iliofemoral endarterectomy with patch angioplasty as well as common iliac and superficial femoral artery stenting. This was performed on 03/31/2020. He was seen in the office on 04/06/2020 with redness from his incision. 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 This was treated with antibiotic beads and a wound VAC. Cultures were negative.  He comes back today for wound VAC change.  CURRENT MEDICATIONS:    Current Outpatient Medications  Medication Sig Dispense Refill  . acetaminophen (TYLENOL) 500 MG tablet Take 1,000 mg by mouth as needed for moderate pain or headache.     [name] aspirin EC 81 MG tablet Take 81 mg by mouth every evening.     [name] atorvastatin (LIPITOR) 20 MG tablet Take 20 mg by mouth daily.     [name] BRILINTA 90 MG TABS tablet Take 90 mg by mouth 2 (two) times daily.    . carvedilol (COREG) 3.125 MG tablet TAKE 1 TABLET TWICE A DAY (Patient taking differently: Take 3.125 mg by mouth 2 (two) times daily with a meal. ) 180 tablet 0  . dextromethorphan-guaiFENesin (MUCINEX DM) 30-600 MG 12hr tablet Take 1 tablet by mouth daily.     [name] ENTRESTO 49-51 MG TAKE 1 TABLET TWICE A DAY (Patient taking differently: Take 1 tablet by mouth 2 (two) times daily. ) 180 tablet 4  . gabapentin (NEURONTIN) 300 MG capsule Take 300 mg by mouth 2 (two) times daily.     . magnesium oxide (MAG-OX) 400 MG tablet Take 400 mg by mouth daily.    . Multiple Vitamins-Minerals (CENTRUM SILVER PO) Take 1 tablet by mouth daily. 50+    . omega-3 acid ethyl esters (LOVAZA) 1 g capsule Take 1 g by mouth daily.     . SUPER B COMPLEX/C PO Take 1 tablet by mouth at bedtime.     . tamsulosin (FLOMAX) 0.4 MG CAPS capsule Take 0.4 mg by mouth 2 (two)  times daily.     . traMADol (ULTRAM) 50 MG tablet Take 50 mg by mouth as needed.     No current facility-administered medications for this visit.    REVIEW OF SYSTEMS:   [X]  denotes positive finding, [ ]  denotes negative finding Cardiac  Comments:  Chest pain or chest pressure:    Shortness of breath upon exertion:    Short of breath when lying flat:    Irregular heart rhythm:    Constitutional    Fever or chills:      PHYSICAL EXAM:   Vitals:   05/02/20 1143  BP: (!) 171/69  Pulse: 63  Resp: 20  Temp: (!) 97.4 F (36.3 C)  SpO2: 100%  Weight: 146 lb (66.2 kg)  Height: 5\' 3"  (1.6 m)    GENERAL: The patient is a well-nourished male, in no acute distress. The vital signs are documented above. CARDIOVASCULAR: There is a regular rate and rhythm. PULMONARY: Non-labored respirations There is healthy granulation tissue throughout most of the wound however there remains a tract in the inferior aspect of the wound.  This was probed with a Q-tip.  There is minimal lymphatic fluid draining currently.  There is no evidence of infection  STUDIES:   None   MEDICAL ISSUES:   I will discontinue the wound  VAC today.  The patient will need wet-to-dry dressing changes, making sure that the deep space in the inferior aspect of the incision gets packed with gauze.  He will follow-up in [facility] clinic in 3 weeks.  Subsequent follow-up for wound evaluation will be made based on the appearance of the wound at that time.  He will need surveillance bypass graft imaging beginning mid September  Annamarie Major, IV, MD, FACS [facility] [phone] Pager [phone]

## 2020-05-04 ENCOUNTER — Other Ambulatory Visit: Payer: Self-pay

## 2020-05-04 ENCOUNTER — Ambulatory Visit (HOSPITAL_COMMUNITY)
Admission: RE | Admit: 2020-05-04 | Discharge: 2020-05-04 | Disposition: A | Discharge status: Home / Self Care | Payer: Medicare Other | Source: Ambulatory Visit | Attending: Nurse Practitioner | Admitting: Nurse Practitioner

## 2020-05-04 DIAGNOSIS — F1021 Alcohol dependence, in remission: Secondary | ICD-10-CM | POA: Diagnosis present

## 2020-05-04 DIAGNOSIS — I509 Heart failure, unspecified: Secondary | ICD-10-CM | POA: Diagnosis not present

## 2020-05-04 DIAGNOSIS — I110 Hypertensive heart disease with heart failure: Secondary | ICD-10-CM | POA: Diagnosis not present

## 2020-05-04 DIAGNOSIS — I70213 Atherosclerosis of native arteries of extremities with intermittent claudication, bilateral legs: Secondary | ICD-10-CM | POA: Diagnosis not present

## 2020-05-04 DIAGNOSIS — T8149XA Infection following a procedure, other surgical site, initial encounter: Secondary | ICD-10-CM | POA: Diagnosis not present

## 2020-05-04 DIAGNOSIS — K7030 Alcoholic cirrhosis of liver without ascites: Secondary | ICD-10-CM

## 2020-05-04 DIAGNOSIS — I739 Peripheral vascular disease, unspecified: Secondary | ICD-10-CM | POA: Diagnosis not present

## 2020-05-04 DIAGNOSIS — F418 Other specified anxiety disorders: Secondary | ICD-10-CM | POA: Diagnosis not present

## 2020-05-04 DIAGNOSIS — K7460 Unspecified cirrhosis of liver: Secondary | ICD-10-CM | POA: Diagnosis not present

## 2020-05-09 DIAGNOSIS — T8149XA Infection following a procedure, other surgical site, initial encounter: Secondary | ICD-10-CM | POA: Diagnosis not present

## 2020-05-12 DIAGNOSIS — G8929 Other chronic pain: Secondary | ICD-10-CM | POA: Diagnosis not present

## 2020-05-12 DIAGNOSIS — I110 Hypertensive heart disease with heart failure: Secondary | ICD-10-CM | POA: Diagnosis not present

## 2020-05-12 DIAGNOSIS — I5022 Chronic systolic (congestive) heart failure: Secondary | ICD-10-CM | POA: Diagnosis not present

## 2020-05-12 DIAGNOSIS — E782 Mixed hyperlipidemia: Secondary | ICD-10-CM | POA: Diagnosis not present

## 2020-05-12 DIAGNOSIS — D509 Iron deficiency anemia, unspecified: Secondary | ICD-10-CM | POA: Diagnosis not present

## 2020-05-12 DIAGNOSIS — I129 Hypertensive chronic kidney disease with stage 1 through stage 4 chronic kidney disease, or unspecified chronic kidney disease: Secondary | ICD-10-CM | POA: Diagnosis not present

## 2020-05-12 DIAGNOSIS — E875 Hyperkalemia: Secondary | ICD-10-CM | POA: Diagnosis not present

## 2020-05-12 DIAGNOSIS — I5042 Chronic combined systolic (congestive) and diastolic (congestive) heart failure: Secondary | ICD-10-CM | POA: Diagnosis not present

## 2020-05-12 DIAGNOSIS — E7849 Other hyperlipidemia: Secondary | ICD-10-CM | POA: Diagnosis not present

## 2020-05-12 DIAGNOSIS — G894 Chronic pain syndrome: Secondary | ICD-10-CM | POA: Diagnosis not present

## 2020-05-12 DIAGNOSIS — F5101 Primary insomnia: Secondary | ICD-10-CM | POA: Diagnosis not present

## 2020-05-12 DIAGNOSIS — I10 Essential (primary) hypertension: Secondary | ICD-10-CM | POA: Diagnosis not present

## 2020-05-23 ENCOUNTER — Ambulatory Visit (INDEPENDENT_AMBULATORY_CARE_PROVIDER_SITE_OTHER): Payer: Self-pay | Admitting: Physician Assistant

## 2020-05-23 ENCOUNTER — Other Ambulatory Visit: Payer: Self-pay

## 2020-05-23 VITALS — BP 156/74 | HR 59 | Temp 98.2°F | Resp 20 | Ht 63.0 in | Wt 140.3 lb

## 2020-05-23 DIAGNOSIS — T8149XA Infection following a procedure, other surgical site, initial encounter: Secondary | ICD-10-CM

## 2020-05-23 DIAGNOSIS — I739 Peripheral vascular disease, unspecified: Secondary | ICD-10-CM

## 2020-05-23 NOTE — Progress Notes (Signed)
Office Note     CC:  follow up Requesting Provider:  Celene Squibb, MD  HPI: Roy [name] is a 74 y.o. (Mar 29, 1946) male who presents for follow up of peripheral arterial disease. He is s/p right iliofemoral endarterectomy with patch angioplasty as well as common iliac and superficial femoral artery stenting. This was performed on 03/31/2020 by Dr. [name]. He was seen in the office on 04/06/2020 with redness from his incision. 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 This was treated with antibiotic beads and a wound VAC. Cultures were negative. He followed up on 05/02/20 with Dr. [name] for wound check and the wound VAC was discontinued and patient was instructed on wet to dry packing changes.   He presents today for follow up of his incision. He states that he is doing great. He has [place] RN once per week and otherwise has been cleaning and changing the dressings daily by himself. He denies any pain, bleeding or drainage. He has not had any redness or warmth to the area and denies any fever or chills. He otherwise is not having any claudication symptoms, rest pain or non healing wounds  The pt is on a statin for cholesterol management.  The pt is on a daily aspirin.   Other AC: Brilinta The pt is on BB for hypertension.   The pt not diabetic.  Tobacco hx:  Current smoker, 1 ppd  Past Medical History:  Diagnosis Date  . AICD (automatic cardioverter/defibrillator) present 08/19/2015   St Jude BiV ICD for primary prevention by Dr. Lovena Le  . Alcohol abuse    6 beers per day; hospital admission in 2009 for withdrawal symptoms  . Alcoholic cirrhosis ([place])   . Anxiety and depression    denies   . Cerebrovascular disease 2009   TIA; 2009- right ICA stent; re-intervention for restenosis complicated by [facility] w/o sx  . CHF (congestive heart failure) ([place]) 06-02-14  . Chronic obstructive pulmonary disease ([place])   . Degenerative joint disease    Total  shoulder arthroplasty-right  . Hyperlipidemia   . Hypertension   . LBBB (left bundle branch block)    Normal echo-2011; stress nuclear in 09/2010--septal hypoperfusion representing nontransmural infarction or the effect of left bundle branch block, no ischemia  . Myocardial infarction [facility]) June 02, 2014   Massive Heart Attack  . Peripheral vascular disease ([place])   . 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  . Thrombocytopenia ([place])   . Tobacco abuse    -100 pack years; 1.5 packs per day  . Traumatic seroma of thigh ([place])    left  . Tubular adenoma of colon     Past Surgical History:  Procedure Laterality Date  . ABDOMINAL AORTAGRAM [name] 05/04/2014   Procedure: ABDOMINAL Maxcine Ham;  Surgeon: [name], MD;  [facility] CATH LAB;  Service: Cardiovascular;  Laterality: [name];  . ABDOMINAL AORTOGRAM [name] 06/25/2017   Procedure: Abdominal Aortogram;  Surgeon: [name], MD;  Location: [place] CV LAB;  Service: Cardiovascular;  Laterality: [name];  . ABDOMINAL AORTOGRAM W/LOWER EXTREMITY [name] 04/22/2018   Procedure: ABDOMINAL AORTOGRAM W/LOWER EXTREMITY;  Surgeon: [name], MD;  Location: [place] CV LAB;  Service: Cardiovascular;  Laterality: [name];  . ABDOMINAL AORTOGRAM W/LOWER EXTREMITY [name] 03/08/2020   Procedure: ABDOMINAL AORTOGRAM W/LOWER EXTREMITY;  Surgeon: [name], MD;  Location: [place] CV LAB;  Service: Cardiovascular;  Laterality: [name];  . AGILE CAPSULE [name] 03/18/2014  Procedure: AGILE CAPSULE;  Surgeon: Danie Binder, MD;  [facility];  Service: Endoscopy;  Laterality: [name];  7:30  . APPLICATION OF WOUND VAC Left 09/03/2017   Procedure: APPLICATION OF WOUND VAC;  Surgeon: Conrad Harper, MD;  Location: [place];  Service: Vascular;  Laterality: Left;  . APPLICATION OF WOUND VAC Right 04/07/2020   Procedure: PLACEMENT OF ANTIBIOTIC BEADS AND APPLICATION OF WOUND VAC;  Surgeon: [name], MD;  Location: [place];  Service: Vascular;   Laterality: Right;  . BACK SURGERY    . BACTERIAL OVERGROWTH TEST [name] 05/24/2015   Procedure: BACTERIAL OVERGROWTH TEST;  Surgeon: Danie Binder, MD;  [facility];  Service: Endoscopy;  Laterality: [name];  0700  . BI-VENTRICULAR IMPLANTABLE CARDIOVERTER DEFIBRILLATOR  (CRT-D)  08/19/2015  . CARPAL TUNNEL RELEASE Left 02/02/2016   Procedure: LEFT CARPAL TUNNEL RELEASE;  Surgeon: Daryll Brod, MD;  Location: [place];  Service: Orthopedics;  Laterality: Left;  ANESTHESIA: IV REGIONAL UPPER ARM  . CATARACT EXTRACTION W/PHACO Right 03/08/2015   Procedure: CATARACT EXTRACTION PHACO AND INTRAOCULAR LENS PLACEMENT (IOC);  Surgeon: Rutherford Guys, MD;  [facility];  Service: Ophthalmology;  Laterality: Right;  CDE:9.46  . CATARACT EXTRACTION W/PHACO Left 03/22/2015   Procedure: CATARACT EXTRACTION PHACO AND INTRAOCULAR LENS PLACEMENT (IOC);  Surgeon: Rutherford Guys, MD;  [facility]: [facility];  Service: Ophthalmology;  Laterality: Left;  CDE:5.80  . COLONOSCOPY  08/22/09   Fields-(Tubular Adenoma)3-mm transverse polyp/4-mm polyp otherwise noraml/small internal hemorrhoids  . COLONOSCOPY [name] 04/14/2014   GUY:QIHKV internal hemorrhids/normal mocsa in the terminal iluem/left colonis redundant  . COLONOSCOPY W/ POLYPECTOMY  2011  . ENDARTERECTOMY FEMORAL Left 08/16/2017   Procedure: ENDARTERECTOMY LEFT PROFUNDA FEMORAL;  Surgeon: [name], MD;  Location: [place];  Service: Vascular;  Laterality: Left;  . ENDARTERECTOMY FEMORAL Right 03/31/2020   Procedure: RIGHT FEMORAL ENDARTERECTOMY;  Surgeon: [name], MD;  Location: [place];  Service: Vascular;  Laterality: Right;  . EP IMPLANTABLE DEVICE [name] 08/19/2015   Procedure: BiV ICD Insertion CRT-D;  Surgeon: Evans Lance, MD;  Location: [place] CV LAB;  Service: Cardiovascular;  Laterality: [name];  . ESOPHAGOGASTRODUODENOSCOPY [name] 03/05/2014   SLF: 1. Stricture at the gastroesophagael junction 2. Small hiatal hernia 3. Moderate  non-erosive gastritis and duodentitis. 4. No surce for Melena identified.   . FEMORAL-POPLITEAL BYPASS GRAFT Left 08/16/2017   Procedure: LEFT  FEMORAL-POPLITEAL ARTERY  BYPASS GRAFT;  Surgeon: [name], MD;  Location: [place];  Service: Vascular;  Laterality: Left;  . GIVENS CAPSULE STUDY [name] 03/30/2014   Procedure: GIVENS CAPSULE STUDY;  Surgeon: Danie Binder, MD;  [facility];  Service: Endoscopy;  Laterality: [name];  7:30  . GROIN DEBRIDEMENT Right 04/07/2020   Procedure: INCISION AND DRAINAGE OF RIGHT GROIN;  Surgeon: [name], MD;  [facility];  Service: Vascular;  Laterality: Right;  . I & D EXTREMITY Left 09/03/2017   Procedure: IRRIGATION AND DEBRIDEMENT EXTREMITY LEFT THIGH SEROMA;  Surgeon: Conrad Haverhill, MD;  Location: [place];  Service: Vascular;  Laterality: Left;  . INSERT / REPLACE / REMOVE PACEMAKER    . INSERTION OF ILIAC STENT  03/31/2020   Procedure: INSERTION OF RIGHT ILIAC ARTERY STENT AND RIGHT SUPERFICIAL FEMORAL ARTERY STENT;  Surgeon: [name], MD;  [facility];  Service: Vascular;;  . IR ANGIO INTRA EXTRACRAN SEL COM CAROTID INNOMINATE BILAT MOD SED  07/16/2017  . IR  ANGIO INTRA EXTRACRAN SEL COM CAROTID INNOMINATE BILAT MOD SED  06/12/2019  . IR ANGIO VERTEBRAL SEL SUBCLAVIAN INNOMINATE BILAT MOD SED  07/16/2017  . IR ANGIO VERTEBRAL SEL SUBCLAVIAN INNOMINATE BILAT MOD SED  06/12/2019  . IR RADIOLOGIST EVAL & MGMT  04/01/2017  . IR RADIOLOGIST EVAL & MGMT  08/02/2017  . IR TRANSCATH EXCRAN VERT OR CAR A STENT  07/22/2019  . IR US GUIDE VASC ACCESS RIGHT  06/12/2019  . JOINT REPLACEMENT Right    Total Shoulder Replacement   . LOWER EXTREMITY ANGIOGRAPHY Bilateral 06/25/2017   Procedure: Lower Extremity Angiography;  Surgeon: [name], MD;  Location: [place] CV LAB;  Service: Cardiovascular;  Laterality: Bilateral;  . LUMBAR FUSION  2010  . PATCH ANGIOPLASTY Right 03/31/2020   Procedure: PATCH ANGIOPLASTY USING XENOSURE BIOLOGIC  PATCH 1cm x 14cm;  Surgeon: [name], MD;  [facility] OR;  Service: Vascular;  Laterality: Right;  . PERIPHERAL VASCULAR ATHERECTOMY Right 04/22/2018   Procedure: PERIPHERAL VASCULAR ATHERECTOMY;  Surgeon: [name], MD;  Location: [place] CV LAB;  Service: Cardiovascular;  Laterality: Right;  right superficial femoral  . PERIPHERAL VASCULAR BALLOON ANGIOPLASTY Right 04/22/2018   Procedure: PERIPHERAL VASCULAR BALLOON ANGIOPLASTY;  Surgeon: [name], MD;  Location: [place] CV LAB;  Service: Cardiovascular;  Laterality: Right;  external iliac  . PERIPHERAL VASCULAR BALLOON ANGIOPLASTY Left 03/08/2020   Procedure: PERIPHERAL VASCULAR BALLOON ANGIOPLASTY;  Surgeon: [name], MD;  Location: [place] CV LAB;  Service: Cardiovascular;  Laterality: Left;  Common Femoral  . PERIPHERAL VASCULAR INTERVENTION Right 03/08/2020   Procedure: PERIPHERAL VASCULAR INTERVENTION;  Surgeon: [name], MD;  Location: [place] CV LAB;  Service: Cardiovascular;  Laterality: Right;  SFA  . RADIOLOGY WITH ANESTHESIA [name] 07/01/2019   Procedure: STENTING;  Surgeon: Luanne Bras, MD;  Location: [place];  Service: Radiology;  Laterality: [name];  . RADIOLOGY WITH ANESTHESIA [name] 07/22/2019   Procedure: STENTING;  Surgeon: Luanne Bras, MD;  Location: [place];  Service: Radiology;  Laterality: [name];  . TOTAL SHOULDER ARTHROPLASTY Right 2011   Dr. Tamera Punt  . ULNAR NERVE TRANSPOSITION Left 02/02/2016   Procedure: LEFT IN-SITU DECOMPRESSION ULNAR NERVE ;  Surgeon: Daryll Brod, MD;  Location: [place];  Service: Orthopedics;  Laterality: Left;  . ULNAR TUNNEL RELEASE Left 02/02/2016   Procedure: LEFT CUBITAL TUNNEL RELEASE;  Surgeon: Daryll Brod, MD;  Location: [place];  Service: Orthopedics;  Laterality: Left;  [name] VASECTOMY  1971  . VEIN HARVEST Left 08/16/2017   Procedure: USING NON REVERSE LEFT GREATER SAPHENOUS VEIN HARVEST;  Surgeon: [name], MD;  [facility];  Service: Vascular;  Laterality: Left;    Social History   Socioeconomic History  . Marital status: Married    Spouse name: Not on file  . Number of children: 2  . Years of education: Not on file  . Highest education level: Not on file  Occupational History  . Occupation: retired, Clinical biochemist    Comment: Programmer, systems: RETIRED  Tobacco Use  . Smoking status: Current Every Day Smoker    Packs/day: 1.00    Years: 60.00    Pack years: 60.00    Types: Cigarettes    Start date: 08/20/1957  . Smokeless tobacco: Never Used  Vaping Use  . Vaping Use: Former  . Start date: 09/10/2016  . Quit date: 10/11/2016  Substance and Sexual Activity  . Alcohol  use: No    Alcohol/week: 0.0 standard drinks    Comment: quit in July 2015  . Drug use: No  . Sexual activity: Not Currently  Other Topics Concern  . Not on file  Social History Narrative   Accompanied by daughter [name]   Lives w/ wife, daughter   Social Determinants of Health   Financial Resource Strain:   . Difficulty of Paying Living Expenses:   Food Insecurity:   . Worried About Charity fundraiser in the Last Year:   . Arboriculturist in the Last Year:   Transportation Needs:   . Film/video editor (Medical):   [name] Lack of Transportation (Non-Medical):   Physical Activity:   . Days of Exercise per Week:   . Minutes of Exercise per Session:   Stress:   . Feeling of Stress :   Social Connections:   . Frequency of Communication with Friends and Family:   . Frequency of Social Gatherings with Friends and Family:   . Attends Religious Services:   . Active Member of Clubs or Organizations:   . Attends Archivist Meetings:   [name] Marital Status:   Intimate Partner Violence:   . Fear of Current or Ex-Partner:   . Emotionally Abused:   [name] Physically Abused:   . Sexually Abused:    Family History  Problem Relation Age of Onset  . Coronary artery disease Mother    . Diabetes Mother   . Heart disease Mother        Before age 29 - 65 Bypasses  . Hypertension Mother   . Heart attack Mother        3-4 Heart attacks  . Alzheimer's disease Father   . Diabetes Father   . Heart attack Sister   . Cancer Brother        Prostate  . Hyperlipidemia Son   . Prostate cancer Brother   . Alzheimer's disease Sister   . Colon cancer Neg Hx     Current Outpatient Medications  Medication Sig Dispense Refill  . acetaminophen (TYLENOL) 500 MG tablet Take 1,000 mg by mouth as needed for moderate pain or headache.     [name] aspirin EC 81 MG tablet Take 81 mg by mouth every evening.     [name] atorvastatin (LIPITOR) 20 MG tablet Take 20 mg by mouth daily.     [name] BRILINTA 90 MG TABS tablet Take 90 mg by mouth 2 (two) times daily.    . carvedilol (COREG) 3.125 MG tablet TAKE 1 TABLET TWICE A DAY (Patient taking differently: Take 3.125 mg by mouth 2 (two) times daily with a meal. ) 180 tablet 0  . dextromethorphan-guaiFENesin (MUCINEX DM) 30-600 MG 12hr tablet Take 1 tablet by mouth daily.     [name] ENTRESTO 49-51 MG TAKE 1 TABLET TWICE A DAY (Patient taking differently: Take 1 tablet by mouth 2 (two) times daily. ) 180 tablet 4  . ferrous sulfate 324 MG TBEC Take 65 mg by mouth 3 (three) times a week. Take Monday,Wednesday and Friday    . gabapentin (NEURONTIN) 300 MG capsule Take 300 mg by mouth 2 (two) times daily.     . magnesium oxide (MAG-OX) 400 MG tablet Take 400 mg by mouth daily.    . Multiple Vitamins-Minerals (CENTRUM SILVER PO) Take 1 tablet by mouth daily. 50+    . omega-3 acid ethyl esters (LOVAZA) 1 g capsule Take 1 g by mouth daily.     [name]  SUPER B COMPLEX/C PO Take 1 tablet by mouth at bedtime.     . tamsulosin (FLOMAX) 0.4 MG CAPS capsule Take 0.4 mg by mouth 2 (two) times daily.     . traMADol (ULTRAM) 50 MG tablet Take 50 mg by mouth as needed.     No current facility-administered medications for this visit.    No Known Allergies   REVIEW OF SYSTEMS:  [X]   denotes positive finding, [ ]  denotes negative finding Cardiac  Comments:  Chest pain or chest pressure:    Shortness of breath upon exertion:    Short of breath when lying flat:    Irregular heart rhythm:        Vascular    Pain in calf, thigh, or hip brought on by ambulation:    Pain in feet at night that wakes you up from your sleep:     Blood clot in your veins:    Leg swelling:         Pulmonary    Oxygen at home:    Productive cough:     Wheezing:         Neurologic    Sudden weakness in arms or legs:     Sudden numbness in arms or legs:     Sudden onset of difficulty speaking or slurred speech:    Temporary loss of vision in one eye:     Problems with dizziness:         Gastrointestinal    Blood in stool:     Vomited blood:         Genitourinary    Burning when urinating:     Blood in urine:        Psychiatric    Major depression:         Hematologic    Bleeding problems:    Problems with blood clotting too easily:        Skin    Rashes or ulcers:        Constitutional    Fever or chills:      PHYSICAL EXAMINATION:  Vitals:   05/23/20 0809  BP: (!) 156/74  Pulse: (!) 59  Resp: 20  Temp: 98.2 F (36.8 C)  TempSrc: Temporal  SpO2: 97%  Weight: 140 lb 4.8 oz (63.6 kg)  Height: 5\' 3"  (1.6 m)    General:  WDWN in NAD; vital signs documented above Gait: Normal HENT: WNL, normocephalic Pulmonary: normal non-labored breathing without wheezing Cardiac: regular HR, without  Murmurs without carotid bruit Abdomen: soft, NT, no masses Vascular Exam/Pulses: 2+ right femoral pulse, proximal right femoral incision healed. distal incision with healthy but exuberant granulation tissue. Curetted this area and replaced wet to dry dressings. Palpable 2+ right popliteal pulse and DP pulse.Bilateral feet warm and well perfused   Extremities: without ischemic changes, without Gangrene , without cellulitis; with open wounds as described above  Musculoskeletal: no  muscle wasting or atrophy  Neurologic: A&O X 3;  No focal weakness or paresthesias are detected Psychiatric:  The pt has Normal affect.   ASSESSMENT/PLAN:: 74 y.o. male here for follow up for his peripheral vascular disease. He follows up for wound check following his  Right iliofemoral endarterectomy with patch angioplasty. The wound is healing nicely. He no longer has a deep wound bed requiring packing. He has healthy granulation tissue in the distal incision. There was some exuberant granulation tissue that I curetted today. Wet to dry dressing applied. He will continue his wet to  dry daily dressing changes until the wound is fully healed - He will have his surveillance bypass graft follow up in September -He will come back in 2-3 weeks for incision check. I anticipate at this time the wound should be fully healed - Have advised patient to follow up earlier if he has concerns that it is not healing as expected or if he develops signs of infection   Karoline Caldwell, PA-C [facility] [phone]  Clinic MD:   [name]

## 2020-06-06 ENCOUNTER — Ambulatory Visit (INDEPENDENT_AMBULATORY_CARE_PROVIDER_SITE_OTHER): Payer: Medicare Other | Admitting: Physician Assistant

## 2020-06-06 ENCOUNTER — Telehealth: Payer: Self-pay | Admitting: Emergency Medicine

## 2020-06-06 ENCOUNTER — Other Ambulatory Visit: Payer: Self-pay

## 2020-06-06 VITALS — BP 129/72 | HR 56 | Temp 97.5°F | Resp 20 | Ht 63.0 in | Wt 144.0 lb

## 2020-06-06 DIAGNOSIS — T8149XA Infection following a procedure, other surgical site, initial encounter: Secondary | ICD-10-CM

## 2020-06-06 DIAGNOSIS — I779 Disorder of arteries and arterioles, unspecified: Secondary | ICD-10-CM

## 2020-06-06 DIAGNOSIS — I739 Peripheral vascular disease, unspecified: Secondary | ICD-10-CM

## 2020-06-06 NOTE — Telephone Encounter (Signed)
LMOM to call office. Alert for noise reversion on LV lead on 3 occasions.

## 2020-06-06 NOTE — Progress Notes (Signed)
Office Note     CC:  follow up Requesting Provider:  Celene Squibb, MD  HPI: Roy [name] is a 74 y.o. ([birth year]) male who presents presents for follow up of peripheral arterial disease. He is s/p right iliofemoral endarterectomy with patch angioplasty as well as common iliac and superficial femoral artery stenting. This was performed on 03/31/2020 by Dr. 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 This was treated with antibiotic beads and a wound VAC. Cultures were negative.He followed up on 05/02/20 with Dr. [name] for wound check and the wound VAC was discontinued and patient was instructed on wet to dry packing changes.   He was last seen in the office on 05/23/2020 and he was doing great. He was having [place] RN once per week help with wound care and otherwise he was cleaning and changing the dressings daily by himself. The wound was healing nicely with some granulation tissue that was curetted and wet to dry dressings were continued. He was instructed to follow up in 2-3 weeks   He presents today for his follow up wound check. He denies any pain, bleeding or drainage. He has not had any redness or warmth to the area and denies any fever or chills. He otherwise is not having any claudication symptoms, rest pain or non healing wounds  The pt is on a statin for cholesterol management.  The pt is on a daily aspirin.   Other AC: Brilinta The pt is on BB for hypertension.   The pt not diabetic.  Tobacco hx:  Current smoker, 1 ppd  Past Medical History:  Diagnosis Date  . AICD (automatic cardioverter/defibrillator) present 08/19/2015   St Jude BiV ICD for primary prevention by Dr. Lovena Le  . Alcohol abuse    6 beers per day; hospital admission in 2009 for withdrawal symptoms  . Alcoholic cirrhosis ([place])   . Anxiety and depression    denies   . Cerebrovascular disease 2009   TIA; 2009-  right ICA stent; re-intervention for restenosis complicated by [facility] w/o sx  . CHF (congestive heart failure) ([place]) 06-02-14  . Chronic obstructive pulmonary disease ([place])   . Degenerative joint disease    Total shoulder arthroplasty-right  . Hyperlipidemia   . Hypertension   . LBBB (left bundle branch block)    Normal echo-2011; stress nuclear in 09/2010--septal hypoperfusion representing nontransmural infarction or the effect of left bundle branch block, no ischemia  . Myocardial infarction [facility]) June 02, 2014   Massive Heart Attack  . Peripheral vascular disease ([place])   . 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  . Thrombocytopenia ([place])   . Tobacco abuse    -100 pack years; 1.5 packs per day  . Traumatic seroma of thigh ([place])    left  . Tubular adenoma of colon     Past Surgical History:  Procedure Laterality Date  . ABDOMINAL AORTAGRAM [name] 05/04/2014   Procedure: ABDOMINAL Maxcine Ham;  Surgeon: [name], MD;  Location: [facility] CATH LAB;  Service: Cardiovascular;  Laterality: [name];  . ABDOMINAL AORTOGRAM [name] 06/25/2017   Procedure: Abdominal Aortogram;  Surgeon: [name], MD;  Location: [place] CV LAB;  Service: Cardiovascular;  Laterality: [name];  . ABDOMINAL AORTOGRAM W/LOWER EXTREMITY [name] 04/22/2018   Procedure: ABDOMINAL AORTOGRAM W/LOWER EXTREMITY;  Surgeon: [name], MD;  Location: [place] CV LAB;  Service: Cardiovascular;  Laterality: [name];  .  ABDOMINAL AORTOGRAM W/LOWER EXTREMITY [name] 03/08/2020   Procedure: ABDOMINAL AORTOGRAM W/LOWER EXTREMITY;  Surgeon: [name], MD;  Location: [place] CV LAB;  Service: Cardiovascular;  Laterality: [name];  . AGILE CAPSULE [name] 03/18/2014   Procedure: AGILE CAPSULE;  Surgeon: Danie Binder, MD;  [facility];  Service: Endoscopy;  Laterality: [name];  7:30  . APPLICATION OF WOUND VAC Left 09/03/2017   Procedure: APPLICATION OF WOUND VAC;  Surgeon: Conrad Glencoe, MD;  Location: [place];  Service:  Vascular;  Laterality: Left;  . APPLICATION OF WOUND VAC Right 04/07/2020   Procedure: PLACEMENT OF ANTIBIOTIC BEADS AND APPLICATION OF WOUND VAC;  Surgeon: [name], MD;  Location: [place];  Service: Vascular;  Laterality: Right;  . BACK SURGERY    . BACTERIAL OVERGROWTH TEST [name] 05/24/2015   Procedure: BACTERIAL OVERGROWTH TEST;  Surgeon: Danie Binder, MD;  [facility];  Service: Endoscopy;  Laterality: [name];  0700  . BI-VENTRICULAR IMPLANTABLE CARDIOVERTER DEFIBRILLATOR  (CRT-D)  08/19/2015  . CARPAL TUNNEL RELEASE Left 02/02/2016   Procedure: LEFT CARPAL TUNNEL RELEASE;  Surgeon: Daryll Brod, MD;  Location: [place];  Service: Orthopedics;  Laterality: Left;  ANESTHESIA: IV REGIONAL UPPER ARM  . CATARACT EXTRACTION W/PHACO Right 03/08/2015   Procedure: CATARACT EXTRACTION PHACO AND INTRAOCULAR LENS PLACEMENT (IOC);  Surgeon: Rutherford Guys, MD;  Location: [facility];  Service: Ophthalmology;  Laterality: Right;  CDE:9.46  . CATARACT EXTRACTION W/PHACO Left 03/22/2015   Procedure: CATARACT EXTRACTION PHACO AND INTRAOCULAR LENS PLACEMENT (IOC);  Surgeon: Rutherford Guys, MD;  Location: [facility];  Service: Ophthalmology;  Laterality: Left;  CDE:5.80  . COLONOSCOPY  08/22/09   Fields-(Tubular Adenoma)3-mm transverse polyp/4-mm polyp otherwise noraml/small internal hemorrhoids  . COLONOSCOPY [name] 04/14/2014   XHB:ZJIRC internal hemorrhids/normal mocsa in the terminal iluem/left colonis redundant  . COLONOSCOPY W/ POLYPECTOMY  2011  . ENDARTERECTOMY FEMORAL Left 08/16/2017   Procedure: ENDARTERECTOMY LEFT PROFUNDA FEMORAL;  Surgeon: [name], MD;  Location: [place];  Service: Vascular;  Laterality: Left;  . ENDARTERECTOMY FEMORAL Right 03/31/2020   Procedure: RIGHT FEMORAL ENDARTERECTOMY;  Surgeon: [name], MD;  Location: [place];  Service: Vascular;  Laterality: Right;  . EP IMPLANTABLE DEVICE [name] 08/19/2015   Procedure: BiV ICD Insertion CRT-D;  Surgeon: Evans Lance,  MD;  Location: [place] CV LAB;  Service: Cardiovascular;  Laterality: [name];  . ESOPHAGOGASTRODUODENOSCOPY [name] 03/05/2014   SLF: 1. Stricture at the gastroesophagael junction 2. Small hiatal hernia 3. Moderate non-erosive gastritis and duodentitis. 4. No surce for Melena identified.   . FEMORAL-POPLITEAL BYPASS GRAFT Left 08/16/2017   Procedure: LEFT  FEMORAL-POPLITEAL ARTERY  BYPASS GRAFT;  Surgeon: [name], MD;  Location: [place];  Service: Vascular;  Laterality: Left;  . GIVENS CAPSULE STUDY [name] 03/30/2014   Procedure: GIVENS CAPSULE STUDY;  Surgeon: Danie Binder, MD;  [facility];  Service: Endoscopy;  Laterality: [name];  7:30  . GROIN DEBRIDEMENT Right 04/07/2020   Procedure: INCISION AND DRAINAGE OF RIGHT GROIN;  Surgeon: [name], MD;  [facility];  Service: Vascular;  Laterality: Right;  . I & D EXTREMITY Left 09/03/2017   Procedure: IRRIGATION AND DEBRIDEMENT EXTREMITY LEFT THIGH SEROMA;  Surgeon: Conrad [place], MD;  Location: [place];  Service: Vascular;  Laterality: Left;  . INSERT / REPLACE / REMOVE PACEMAKER    . INSERTION OF ILIAC STENT  03/31/2020   Procedure: INSERTION OF RIGHT ILIAC  ARTERY STENT AND RIGHT SUPERFICIAL FEMORAL ARTERY STENT;  Surgeon: [name], MD;  [facility];  Service: Vascular;;  . IR ANGIO INTRA EXTRACRAN SEL COM CAROTID INNOMINATE BILAT MOD SED  07/16/2017  . IR ANGIO INTRA EXTRACRAN SEL COM CAROTID INNOMINATE BILAT MOD SED  06/12/2019  . IR ANGIO VERTEBRAL SEL SUBCLAVIAN INNOMINATE BILAT MOD SED  07/16/2017  . IR ANGIO VERTEBRAL SEL SUBCLAVIAN INNOMINATE BILAT MOD SED  06/12/2019  . IR RADIOLOGIST EVAL & MGMT  04/01/2017  . IR RADIOLOGIST EVAL & MGMT  08/02/2017  . IR TRANSCATH EXCRAN VERT OR CAR A STENT  07/22/2019  . IR US GUIDE VASC ACCESS RIGHT  06/12/2019  . JOINT REPLACEMENT Right    Total Shoulder Replacement   . LOWER EXTREMITY ANGIOGRAPHY Bilateral 06/25/2017   Procedure: Lower Extremity Angiography;  Surgeon: [name], MD;  Location: [place] CV LAB;  Service: Cardiovascular;  Laterality: Bilateral;  . LUMBAR FUSION  2010  . PATCH ANGIOPLASTY Right 03/31/2020   Procedure: PATCH ANGIOPLASTY USING XENOSURE BIOLOGIC PATCH 1cm x 14cm;  Surgeon: [name], MD;  Location: [facility] OR;  Service: Vascular;  Laterality: Right;  . PERIPHERAL VASCULAR ATHERECTOMY Right 04/22/2018   Procedure: PERIPHERAL VASCULAR ATHERECTOMY;  Surgeon: [name], MD;  Location: [place] CV LAB;  Service: Cardiovascular;  Laterality: Right;  right superficial femoral  . PERIPHERAL VASCULAR BALLOON ANGIOPLASTY Right 04/22/2018   Procedure: PERIPHERAL VASCULAR BALLOON ANGIOPLASTY;  Surgeon: [name], MD;  Location: [place] CV LAB;  Service: Cardiovascular;  Laterality: Right;  external iliac  . PERIPHERAL VASCULAR BALLOON ANGIOPLASTY Left 03/08/2020   Procedure: PERIPHERAL VASCULAR BALLOON ANGIOPLASTY;  Surgeon: [name], MD;  Location: [place] CV LAB;  Service: Cardiovascular;  Laterality: Left;  Common Femoral  . PERIPHERAL VASCULAR INTERVENTION Right 03/08/2020   Procedure: PERIPHERAL VASCULAR INTERVENTION;  Surgeon: [name], MD;  Location: [place] CV LAB;  Service: Cardiovascular;  Laterality: Right;  SFA  . RADIOLOGY WITH ANESTHESIA [name] 07/01/2019   Procedure: STENTING;  Surgeon: Luanne Bras, MD;  Location: [place];  Service: Radiology;  Laterality: [name];  . RADIOLOGY WITH ANESTHESIA [name] 07/22/2019   Procedure: STENTING;  Surgeon: Luanne Bras, MD;  Location: [place];  Service: Radiology;  Laterality: [name];  . TOTAL SHOULDER ARTHROPLASTY Right 2011   Dr. Tamera Punt  . ULNAR NERVE TRANSPOSITION Left 02/02/2016   Procedure: LEFT IN-SITU DECOMPRESSION ULNAR NERVE ;  Surgeon: Daryll Brod, MD;  Location: [place];  Service: Orthopedics;  Laterality: Left;  . ULNAR TUNNEL RELEASE Left 02/02/2016   Procedure: LEFT CUBITAL TUNNEL RELEASE;  Surgeon: Daryll Brod, MD;  Location:  [place];  Service: Orthopedics;  Laterality: Left;  [name] VASECTOMY  1971  . VEIN HARVEST Left 08/16/2017   Procedure: USING NON REVERSE LEFT GREATER SAPHENOUS VEIN HARVEST;  Surgeon: [name], MD;  [facility];  Service: Vascular;  Laterality: Left;    Social History   Socioeconomic History  . Marital status: Married    Spouse name: Not on file  . Number of children: 2  . Years of education: Not on file  . Highest education level: Not on file  Occupational History  . Occupation: retired, Clinical biochemist    Comment: Programmer, systems: RETIRED  Tobacco Use  . Smoking status: Current Every Day Smoker    Packs/day: 1.00    Years: 60.00    Pack years: 60.00    Types:  Cigarettes    Start date: 08/20/1957  . Smokeless tobacco: Never Used  Vaping Use  . Vaping Use: Former  . Start date: 09/10/2016  . Quit date: 10/11/2016  Substance and Sexual Activity  . Alcohol use: No    Alcohol/week: 0.0 standard drinks    Comment: quit in July 2015  . Drug use: No  . Sexual activity: Not Currently  Other Topics Concern  . Not on file  Social History Narrative   Accompanied by daughter [name]   Lives w/ wife, daughter   Social Determinants of Health   Financial Resource Strain:   . Difficulty of Paying Living Expenses:   Food Insecurity:   . Worried About Charity fundraiser in the Last Year:   . Arboriculturist in the Last Year:   Transportation Needs:   . Film/video editor (Medical):   [name] Lack of Transportation (Non-Medical):   Physical Activity:   . Days of Exercise per Week:   . Minutes of Exercise per Session:   Stress:   . Feeling of Stress :   Social Connections:   . Frequency of Communication with Friends and Family:   . Frequency of Social Gatherings with Friends and Family:   . Attends Religious Services:   . Active Member of Clubs or Organizations:   . Attends Archivist Meetings:   [name] Marital Status:   Intimate  Partner Violence:   . Fear of Current or Ex-Partner:   . Emotionally Abused:   [name] Physically Abused:   . Sexually Abused:     Family History  Problem Relation Age of Onset  . Coronary artery disease Mother   . Diabetes Mother   . Heart disease Mother        Before age 26 - 6 Bypasses  . Hypertension Mother   . Heart attack Mother        3-4 Heart attacks  . Alzheimer's disease Father   . Diabetes Father   . Heart attack Sister   . Cancer Brother        Prostate  . Hyperlipidemia Son   . Prostate cancer Brother   . Alzheimer's disease Sister   . Colon cancer Neg Hx     Current Outpatient Medications  Medication Sig Dispense Refill  . acetaminophen (TYLENOL) 500 MG tablet Take 1,000 mg by mouth as needed for moderate pain or headache.     [name] aspirin EC 81 MG tablet Take 81 mg by mouth every evening.     [name] atorvastatin (LIPITOR) 20 MG tablet Take 20 mg by mouth daily.     [name] BRILINTA 90 MG TABS tablet Take 90 mg by mouth 2 (two) times daily.    . carvedilol (COREG) 3.125 MG tablet TAKE 1 TABLET TWICE A DAY (Patient taking differently: Take 3.125 mg by mouth 2 (two) times daily with a meal. ) 180 tablet 0  . dextromethorphan-guaiFENesin (MUCINEX DM) 30-600 MG 12hr tablet Take 1 tablet by mouth daily.     [name] ENTRESTO 49-51 MG TAKE 1 TABLET TWICE A DAY (Patient taking differently: Take 1 tablet by mouth 2 (two) times daily. ) 180 tablet 4  . ferrous sulfate 324 MG TBEC Take 65 mg by mouth 3 (three) times a week. Take Monday,Wednesday and Friday    . gabapentin (NEURONTIN) 300 MG capsule Take 300 mg by mouth 2 (two) times daily.     . magnesium oxide (MAG-OX) 400 MG tablet Take 400 mg by  mouth daily.    . Multiple Vitamins-Minerals (CENTRUM SILVER PO) Take 1 tablet by mouth daily. 50+    . omega-3 acid ethyl esters (LOVAZA) 1 g capsule Take 1 g by mouth daily.     . SUPER B COMPLEX/C PO Take 1 tablet by mouth at bedtime.     . tamsulosin (FLOMAX) 0.4 MG CAPS capsule Take 0.4 mg by  mouth 2 (two) times daily.     . traMADol (ULTRAM) 50 MG tablet Take 50 mg by mouth as needed.     No current facility-administered medications for this visit.    No Known Allergies   REVIEW OF SYSTEMS:   [X]  denotes positive finding, [ ]  denotes negative finding Cardiac  Comments:  Chest pain or chest pressure:    Shortness of breath upon exertion:    Short of breath when lying flat:    Irregular heart rhythm:        Vascular    Pain in calf, thigh, or hip brought on by ambulation:    Pain in feet at night that wakes you up from your sleep:     Blood clot in your veins:    Leg swelling:         Pulmonary    Oxygen at home:    Productive cough:     Wheezing:         Neurologic    Sudden weakness in arms or legs:     Sudden numbness in arms or legs:     Sudden onset of difficulty speaking or slurred speech:    Temporary loss of vision in one eye:     Problems with dizziness:         Gastrointestinal    Blood in stool:     Vomited blood:         Genitourinary    Burning when urinating:     Blood in urine:        Psychiatric    Major depression:         Hematologic    Bleeding problems:    Problems with blood clotting too easily:        Skin    Rashes or ulcers:        Constitutional    Fever or chills:      PHYSICAL EXAMINATION:  Vitals:   06/06/20 0807  BP: 129/72  Pulse: (!) 56  Resp: 20  Temp: (!) 97.5 F (36.4 C)  TempSrc: Temporal  SpO2: 96%  Weight: 144 lb (65.3 kg)  Height: 5\' 3"  (1.6 m)    General:  WDWN in NAD; vital signs documented above Gait: Normal HENT: WNL, normocephalic Pulmonary: normal non-labored breathing  Cardiac: regular HR, without  Murmurs Abdomen: soft, NT, no masses Vascular Exam/Pulses:2+ bilateral radial pulses, 2+bilateral femoral pulses, 2+ popliteal pulses, 2+ DP pulses bilaterally. Bilateral feet warm Extremities: with ischemic changes, with Gangrene , with cellulitis;  Right femoral incision essentially  healed. Small area of distal incision with small area of thin scar tissue/ granulation tissue Musculoskeletal: no muscle wasting or atrophy  Neurologic: A&O X 3;  No focal weakness or paresthesias are detected Psychiatric:  The pt has Normal affect.   ASSESSMENT/PLAN:: 74 y.o. male here for follow up for his peripheral vascular disease. He follows up for wound check following his  Right iliofemoral endarterectomy with patch angioplasty. The wound is healing nicely. He no longer needs to keep this area dressed. Recommend allowing it to get some air.  Advised him to continue to clean with plain soap and water and pat dry - He will have his surveillance aorto iliac and RLE duplex follow up in September - Have advised patient to follow up earlier if he has concerns that it is not healing as expected or if he develops signs of infection   Karoline Caldwell, PA-C [facility] [phone]  Clinic MD:  [name]

## 2020-06-07 ENCOUNTER — Other Ambulatory Visit: Payer: Self-pay | Admitting: *Deleted

## 2020-06-07 DIAGNOSIS — I70213 Atherosclerosis of native arteries of extremities with intermittent claudication, bilateral legs: Secondary | ICD-10-CM

## 2020-06-07 DIAGNOSIS — I739 Peripheral vascular disease, unspecified: Secondary | ICD-10-CM

## 2020-06-10 NOTE — Telephone Encounter (Signed)
Spoke with pt regarding RV lead noise reversion and resulting brief VP inhibition (pacing is programmed off during V noise reversion). He is agreeable to appointment with Dr. Lovena Le on 06/14/20 at 2:30pm for in-clinic lead testing. Advised pt to call 911 if any syncope over the weekend and to not drive himself to the hospital. Pt verbalizes understanding and denies questions or concerns at this time.

## 2020-06-14 ENCOUNTER — Encounter: Payer: Self-pay | Admitting: Internal Medicine

## 2020-06-14 ENCOUNTER — Ambulatory Visit (INDEPENDENT_AMBULATORY_CARE_PROVIDER_SITE_OTHER): Payer: Medicare Other | Admitting: Internal Medicine

## 2020-06-14 ENCOUNTER — Other Ambulatory Visit: Payer: Self-pay

## 2020-06-14 VITALS — BP 126/60 | HR 69 | Ht 63.0 in | Wt 141.6 lb

## 2020-06-14 DIAGNOSIS — I739 Peripheral vascular disease, unspecified: Secondary | ICD-10-CM

## 2020-06-14 DIAGNOSIS — Z95810 Presence of automatic (implantable) cardiac defibrillator: Secondary | ICD-10-CM | POA: Diagnosis not present

## 2020-06-14 DIAGNOSIS — I779 Disorder of arteries and arterioles, unspecified: Secondary | ICD-10-CM | POA: Diagnosis not present

## 2020-06-14 DIAGNOSIS — I5022 Chronic systolic (congestive) heart failure: Secondary | ICD-10-CM | POA: Diagnosis not present

## 2020-06-14 NOTE — Progress Notes (Signed)
HPI [name] returns today for followup. He is  A pleasant 74 yo man with an ICM, chronic systolic heart failure, and s/p BIV ICD insertion. The patient has developed some noise on his ICD lead. His noise reversion algorithm is on. He denies syncope. No chest pain. He is not device dependent. No ICD therapies.  No Known Allergies   Current Outpatient Medications  Medication Sig Dispense Refill   acetaminophen (TYLENOL) 500 MG tablet Take 1,000 mg by mouth as needed for moderate pain or headache.      aspirin EC 81 MG tablet Take 81 mg by mouth every evening.      atorvastatin (LIPITOR) 20 MG tablet Take 20 mg by mouth daily.      BRILINTA 90 MG TABS tablet Take 90 mg by mouth 2 (two) times daily.     carvedilol (COREG) 3.125 MG tablet TAKE 1 TABLET TWICE A DAY 180 tablet 0   dextromethorphan-guaiFENesin (MUCINEX DM) 30-600 MG 12hr tablet Take 1 tablet by mouth daily.      ENTRESTO 49-51 MG TAKE 1 TABLET TWICE A DAY 180 tablet 4   ferrous sulfate 324 MG TBEC Take 65 mg by mouth 3 (three) times a week. Take Monday,Wednesday and Friday     gabapentin (NEURONTIN) 300 MG capsule Take 300 mg by mouth 2 (two) times daily.      magnesium oxide (MAG-OX) 400 MG tablet Take 400 mg by mouth daily.     Multiple Vitamins-Minerals (CENTRUM SILVER PO) Take 1 tablet by mouth daily. 50+     omega-3 acid ethyl esters (LOVAZA) 1 g capsule Take 1 g by mouth daily.      SUPER B COMPLEX/C PO Take 1 tablet by mouth at bedtime.      tamsulosin (FLOMAX) 0.4 MG CAPS capsule Take 0.4 mg by mouth 2 (two) times daily.      traMADol (ULTRAM) 50 MG tablet Take 50 mg by mouth as needed.     No current facility-administered medications for this visit.     Past Medical History:  Diagnosis Date   AICD (automatic cardioverter/defibrillator) present 08/19/2015   St Jude BiV ICD for primary prevention by Dr. Lovena Le   Alcohol abuse    6 beers per day; hospital admission in 2009 for withdrawal  symptoms   Alcoholic cirrhosis ([place])    Anxiety and depression    denies    Cerebrovascular disease 2009   TIA; 2009- right ICA stent; re-intervention for restenosis complicated by [facility] w/o sx   CHF (congestive heart failure) ([place]) 06-02-14   Chronic obstructive pulmonary disease ([place])    Degenerative joint disease    Total shoulder arthroplasty-right   Hyperlipidemia    Hypertension    LBBB (left bundle branch block)    Normal echo-2011; stress nuclear in 09/2010--septal hypoperfusion representing nontransmural infarction or the effect of left bundle branch block, no ischemia   Myocardial infarction [facility]) June 02, 2014   Massive Heart Attack   Peripheral vascular disease ([facility])    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   Thrombocytopenia (HCC)    Tobacco abuse    -100 pack years; 1.5 packs per day   Traumatic seroma of thigh (HCC)    left   Tubular adenoma of colon     ROS:   All systems reviewed and negative except as noted in the HPI.   Past Surgical History:  Procedure Laterality Date   ABDOMINAL AORTAGRAM [name] 05/04/2014   Procedure: ABDOMINAL  Maxcine Ham;  Surgeon: [name], MD;  Location: [facility] CATH LAB;  Service: Cardiovascular;  Laterality: [name];   ABDOMINAL AORTOGRAM [name] 06/25/2017   Procedure: Abdominal Aortogram;  Surgeon: [name], MD;  Location: [place] CV LAB;  Service: Cardiovascular;  Laterality: [name];   ABDOMINAL AORTOGRAM W/LOWER EXTREMITY [name] 04/22/2018   Procedure: ABDOMINAL AORTOGRAM W/LOWER EXTREMITY;  Surgeon: [name], MD;  Location: [place] CV LAB;  Service: Cardiovascular;  Laterality: [name];   ABDOMINAL AORTOGRAM W/LOWER EXTREMITY [name] 03/08/2020   Procedure: ABDOMINAL AORTOGRAM W/LOWER EXTREMITY;  Surgeon: [name], MD;  Location: [place] CV LAB;  Service: Cardiovascular;  Laterality: [name];   AGILE CAPSULE [name] 03/18/2014   Procedure: AGILE CAPSULE;  Surgeon: Danie Binder, MD;  [facility]  ENDO SU[facility];  Service: Endoscopy;  Laterality: [name];  1:61   APPLICATION OF WOUND VAC Left 09/03/2017   Procedure: APPLICATION OF WOUND VAC;  Surgeon: Conrad [place], MD;  Location: [place];  Service: Vascular;  Laterality: Left;   APPLICATION OF WOUND VAC Right 04/07/2020   Procedure: PLACEMENT OF ANTIBIOTIC BEADS AND APPLICATION OF WOUND VAC;  Surgeon: [name], MD;  [facility] OR;  Service: Vascular;  Laterality: Right;   BACK SURGERY     BACTERIAL OVERGROWTH TEST [name] 05/24/2015   Procedure: BACTERIAL OVERGROWTH TEST;  Surgeon: Danie Binder, MD;  [facility] ENDO [facility];  Service: Endoscopy;  Laterality: [name];  0700   BI-VENTRICULAR IMPLANTABLE CARDIOVERTER DEFIBRILLATOR  (CRT-D)  08/19/2015   CARPAL TUNNEL RELEASE Left 02/02/2016   Procedure: LEFT CARPAL TUNNEL RELEASE;  Surgeon: Daryll Brod, MD;  Location: [place];  Service: Orthopedics;  Laterality: Left;  ANESTHESIA: IV REGIONAL UPPER ARM   CATARACT EXTRACTION W/PHACO Right 03/08/2015   Procedure: CATARACT EXTRACTION PHACO AND INTRAOCULAR LENS PLACEMENT (IOC);  Surgeon: Rutherford Guys, MD;  Location: [facility] ORS;  Service: Ophthalmology;  Laterality: Right;  CDE:9.46   CATARACT EXTRACTION W/PHACO Left 03/22/2015   Procedure: CATARACT EXTRACTION PHACO AND INTRAOCULAR LENS PLACEMENT (IOC);  Surgeon: Rutherford Guys, MD;  Location: [facility] ORS;  Service: Ophthalmology;  Laterality: Left;  CDE:5.80   COLONOSCOPY  08/22/09   Fields-(Tubular Adenoma)3-mm transverse polyp/4-mm polyp otherwise noraml/small internal hemorrhoids   COLONOSCOPY [name] 04/14/2014   WRU:EAVWU internal hemorrhids/normal mocsa in the terminal iluem/left colonis redundant   COLONOSCOPY W/ POLYPECTOMY  2011   ENDARTERECTOMY FEMORAL Left 08/16/2017   Procedure: ENDARTERECTOMY LEFT PROFUNDA FEMORAL;  Surgeon: [name], MD;  Location: [facility] OR;  Service: Vascular;  Laterality: Left;   ENDARTERECTOMY FEMORAL Right 03/31/2020   Procedure: RIGHT FEMORAL  ENDARTERECTOMY;  Surgeon: [name], MD;  Location: [place];  Service: Vascular;  Laterality: Right;   EP IMPLANTABLE DEVICE [name] 08/19/2015   Procedure: BiV ICD Insertion CRT-D;  Surgeon: Evans Lance, MD;  Location: [place] CV LAB;  Service: Cardiovascular;  Laterality: [name];   ESOPHAGOGASTRODUODENOSCOPY [name] 03/05/2014   SLF: 1. Stricture at the gastroesophagael junction 2. Small hiatal hernia 3. Moderate non-erosive gastritis and duodentitis. 4. No surce for Melena identified.    FEMORAL-POPLITEAL BYPASS GRAFT Left 08/16/2017   Procedure: LEFT  FEMORAL-POPLITEAL ARTERY  BYPASS GRAFT;  Surgeon: [name], MD;  Location: [place];  Service: Vascular;  Laterality: Left;   GIVENS CAPSULE STUDY [name] 03/30/2014   Procedure: GIVENS CAPSULE STUDY;  Surgeon: Danie Binder, MD;  [facility];  Service: Endoscopy;  Laterality: [name];  7:30   GROIN DEBRIDEMENT Right 04/07/2020  Procedure: INCISION AND DRAINAGE OF RIGHT GROIN;  Surgeon: [name], MD;  [facility];  Service: Vascular;  Laterality: Right;   I & D EXTREMITY Left 09/03/2017   Procedure: IRRIGATION AND DEBRIDEMENT EXTREMITY LEFT THIGH SEROMA;  Surgeon: Conrad Elgin, MD;  Location: [place];  Service: Vascular;  Laterality: Left;   INSERT / REPLACE / [place]  03/31/2020   Procedure: INSERTION OF RIGHT ILIAC ARTERY STENT AND RIGHT SUPERFICIAL FEMORAL ARTERY STENT;  Surgeon: [name], MD;  [facility];  Service: Vascular;;   IR ANGIO INTRA EXTRACRAN SEL COM CAROTID INNOMINATE BILAT MOD SED  07/16/2017   IR ANGIO INTRA EXTRACRAN SEL COM CAROTID INNOMINATE BILAT MOD SED  06/12/2019   IR ANGIO VERTEBRAL SEL SUBCLAVIAN INNOMINATE BILAT MOD SED  07/16/2017   IR ANGIO VERTEBRAL SEL SUBCLAVIAN INNOMINATE BILAT MOD SED  06/12/2019   IR RADIOLOGIST EVAL & MGMT  04/01/2017   IR RADIOLOGIST EVAL & MGMT  08/02/2017   IR TRANSCATH EXCRAN VERT OR CAR A STENT  07/22/2019   IR US  GUIDE VASC ACCESS RIGHT  06/12/2019   JOINT REPLACEMENT Right    Total Shoulder Replacement    LOWER EXTREMITY ANGIOGRAPHY Bilateral 06/25/2017   Procedure: Lower Extremity Angiography;  Surgeon: [name], MD;  Location: [place] CV LAB;  Service: Cardiovascular;  Laterality: Bilateral;   LUMBAR FUSION  2010   PATCH ANGIOPLASTY Right 03/31/2020   Procedure: PATCH ANGIOPLASTY USING XENOSURE BIOLOGIC PATCH 1cm x 14cm;  Surgeon: [name], MD;  Location: [facility] OR;  Service: Vascular;  Laterality: Right;   PERIPHERAL VASCULAR ATHERECTOMY Right 04/22/2018   Procedure: PERIPHERAL VASCULAR ATHERECTOMY;  Surgeon: [name], MD;  Location: [place] CV LAB;  Service: Cardiovascular;  Laterality: Right;  right superficial femoral   PERIPHERAL VASCULAR BALLOON ANGIOPLASTY Right 04/22/2018   Procedure: PERIPHERAL VASCULAR BALLOON ANGIOPLASTY;  Surgeon: [name], MD;  Location: [place] CV LAB;  Service: Cardiovascular;  Laterality: Right;  external iliac   PERIPHERAL VASCULAR BALLOON ANGIOPLASTY Left 03/08/2020   Procedure: PERIPHERAL VASCULAR BALLOON ANGIOPLASTY;  Surgeon: [name], MD;  Location: [place] CV LAB;  Service: Cardiovascular;  Laterality: Left;  Common Femoral   PERIPHERAL VASCULAR INTERVENTION Right 03/08/2020   Procedure: PERIPHERAL VASCULAR INTERVENTION;  Surgeon: [name], MD;  Location: [place] CV LAB;  Service: Cardiovascular;  Laterality: Right;  SFA   RADIOLOGY WITH ANESTHESIA [name] 07/01/2019   Procedure: STENTING;  Surgeon: Luanne Bras, MD;  Location: [place];  Service: Radiology;  Laterality: [name];   RADIOLOGY WITH ANESTHESIA [name] 07/22/2019   Procedure: STENTING;  Surgeon: Luanne Bras, MD;  Location: [place];  Service: Radiology;  Laterality: [name];   TOTAL SHOULDER ARTHROPLASTY Right 2011   Dr. Rafael Bihari NERVE TRANSPOSITION Left 02/02/2016   Procedure: LEFT IN-SITU DECOMPRESSION ULNAR NERVE ;  Surgeon: Daryll Brod, MD;  Location: [place];  Service: Orthopedics;  Laterality: Left;   ULNAR TUNNEL RELEASE Left 02/02/2016   Procedure: LEFT CUBITAL TUNNEL RELEASE;  Surgeon: Daryll Brod, MD;  Location: [place];  Service: Orthopedics;  Laterality: Left;   VASECTOMY  1971   VEIN HARVEST Left 08/16/2017   Procedure: USING NON REVERSE LEFT GREATER SAPHENOUS VEIN HARVEST;  Surgeon: [name], MD;  [facility];  Service: Vascular;  Laterality: Left;     Family History  Problem Relation Age of  Onset   Coronary artery disease Mother    Diabetes Mother    Heart disease Mother        Before age 26 - 72 Bypasses   Hypertension Mother    Heart attack Mother        3-4 Heart attacks   Alzheimer's disease Father    Diabetes Father    Heart attack Sister    Cancer Brother        Prostate   Hyperlipidemia Son    Prostate cancer Brother    Alzheimer's disease Sister    Colon cancer Neg Hx      Social History   Socioeconomic History   Marital status: Married    Spouse name: Not on file   Number of children: 2   Years of education: Not on file   Highest education level: Not on file  Occupational History   Occupation: retired, Clinical biochemist    Comment: Programmer, systems: RETIRED  Tobacco Use   Smoking status: Current Every Day Smoker    Packs/day: 1.00    Years: 60.00    Pack years: 60.00    Types: Cigarettes    Start date: 08/20/1957   Smokeless tobacco: Never Used  Vaping Use   Vaping Use: Former   Start date: 09/10/2016   Quit date: 10/11/2016  Substance and Sexual Activity   Alcohol use: No    Alcohol/week: 0.0 standard drinks    Comment: quit in July 2015   Drug use: No   Sexual activity: Not Currently  Other Topics Concern   Not on file  Social History Narrative   Accompanied by daughter [name]   Lives w/ wife, daughter   Social Determinants of Health   Financial Resource Strain:     Difficulty of Paying Living Expenses:   Food Insecurity:    Worried About Charity fundraiser in the Last Year:    Arboriculturist in the Last Year:   Transportation Needs:    Film/video editor (Medical):    Lack of Transportation (Non-Medical):   Physical Activity:    Days of Exercise per Week:    Minutes of Exercise per Session:   Stress:    Feeling of Stress :   Social Connections:    Frequency of Communication with Friends and Family:    Frequency of Social Gatherings with Friends and Family:    Attends Religious Services:    Active Member of Clubs or Organizations:    Attends Music therapist:    Marital Status:   Intimate Partner Violence:    Fear of Current or Ex-Partner:    Emotionally Abused:    Physically Abused:    Sexually Abused:      BP 126/60    Pulse 69    Ht 5\' 3"  (1.6 m)    Wt 141 lb 9.6 oz (64.2 kg)    SpO2 99%    BMI 25.08 kg/m   Physical Exam:  stable appearing NAD HEENT: Unremarkable Neck:  No JVD, no thyromegally Lymphatics:  No adenopathy Back:  No CVA tenderness Lungs:  Clear with no wheezes HEART:  Regular rate rhythm, no murmurs, no rubs, no clicks Abd:  soft, positive bowel sounds, no organomegally, no rebound, no guarding Ext:  2 plus pulses, no edema, no cyanosis, no clubbing Skin:  No rashes no nodules Neuro:  CN II through XII intact, motor grossly intact  DEVICE  Normal device function.  See PaceArt for  details.   Assess/Plan: 1. ICD lead noise - we have attempted to program around this. We may not be able to. When he reaches ERI, he will need lead revison, sooner if he develops symptoms or recieves an ICD shock.  2. Chronic systolic heart failure - his symptoms are class 2. He will continue his current meds. 3. Tobacco abuse - he is still smoking 1ppd. He is encouraged to stop/decrease his consumption. 4. Peripheral vascular disease - he has stage 3 claudication but no rest pain.  Mikle [email or web address].D.

## 2020-06-14 NOTE — Patient Instructions (Signed)
Medication Instructions:  Your physician recommends that you continue on your current medications as directed. Please refer to the Current Medication list given to you today.  *If you need a refill on your cardiac medications before your next appointment, please call your pharmacy*  Lab Work: None ordered.  If you have labs (blood work) drawn today and your tests are completely normal, you will receive your results only by: [name] MyChart Message (if you have MyChart) OR . A paper copy in the mail If you have any lab test that is abnormal or we need to change your treatment, we will call you to review the results.  Testing/Procedures: None ordered.  Follow-Up: At [facility], you and your health needs are our priority.  As part of our continuing mission to provide you with exceptional heart care, we have created designated Provider Care Teams.  These Care Teams include your primary Cardiologist (physician) and Advanced Practice Providers (APPs -  Physician Assistants and Nurse Practitioners) who all work together to provide you with the care you need, when you need it.  We recommend signing up for the patient portal called "MyChart".  Sign up information is provided on this After Visit Summary.  MyChart is used to connect with patients for Virtual Visits (Telemedicine).  Patients are able to view lab/test results, encounter notes, upcoming appointments, etc.  Non-urgent messages can be sent to your provider as well.   To learn more about what you can do with MyChart, go to [email or web address].    Your next appointment:   Your physician wants you to follow-up in: 1 year with Dr. Lovena Le. You will receive a reminder letter in the mail two months in advance. If you don't receive a letter, please call our office to schedule the follow-up appointment.  Remote monitoring is used to monitor your ICD from home. This monitoring reduces the number of office visits required to check your device to one  time per year. It allows [place] to keep an eye on the functioning of your device to ensure it is working properly. You are scheduled for a device check from home on 07/06/20. You may send your transmission at any time that day. If you have a wireless device, the transmission will be sent automatically. After your physician reviews your transmission, you will receive a postcard with your next transmission date.  Other Instructions:

## 2020-06-22 ENCOUNTER — Other Ambulatory Visit (HOSPITAL_COMMUNITY): Payer: Self-pay | Admitting: Interventional Radiology

## 2020-06-22 DIAGNOSIS — I671 Cerebral aneurysm, nonruptured: Secondary | ICD-10-CM

## 2020-06-24 ENCOUNTER — Telehealth: Payer: Self-pay

## 2020-06-24 NOTE — Telephone Encounter (Signed)
Merlin alert received- Several new non-sustained RV oversensing episodes and a RV noise reversion episode. Monitoring per previous notes  Per Dr. Lovena Le office notes on 06/14/20- 1. ICD lead noise - we have attempted to program around this. We may not be able to. When he reaches ERI, he will need lead revison, sooner if he develops symptoms or recieves an ICD shock.   Spoke with pt, he denies any symptoms, indicates he has been feeling good.  Pt. V/u to report any cardiac symptoms such as lightheaded/ dizziness, chest pain, SOB.  Will continue to monitor.

## 2020-07-01 ENCOUNTER — Telehealth: Payer: Self-pay

## 2020-07-01 NOTE — Telephone Encounter (Signed)
Merlin alert receive for continued RV oversensing episodes.

## 2020-07-01 NOTE — Telephone Encounter (Signed)
Merlin alert received for non sustained RV oversensing episodes that are happening frequently.  Pt seen in clinic on 7/20 for programming due to RV noise reversions,  Secure Sense programmed on for monitoring of lead.    Pt was contacted following alerts on 7/29, he was asymptomatic at that time and v/u to report any symptoms that occur.    [facility] rep contact, they recommended forwarding to Dr. Lovena Le for consideration of lead revision.      Most recent Oversensing episode:

## 2020-07-01 NOTE — Telephone Encounter (Signed)
Per Dr. Legrand Rams follow up to discuss gen change +/- lead extraction

## 2020-07-04 DIAGNOSIS — D509 Iron deficiency anemia, unspecified: Secondary | ICD-10-CM | POA: Diagnosis not present

## 2020-07-04 DIAGNOSIS — I5022 Chronic systolic (congestive) heart failure: Secondary | ICD-10-CM | POA: Diagnosis not present

## 2020-07-04 DIAGNOSIS — I10 Essential (primary) hypertension: Secondary | ICD-10-CM | POA: Diagnosis not present

## 2020-07-04 DIAGNOSIS — I739 Peripheral vascular disease, unspecified: Secondary | ICD-10-CM | POA: Diagnosis not present

## 2020-07-04 DIAGNOSIS — E782 Mixed hyperlipidemia: Secondary | ICD-10-CM | POA: Diagnosis not present

## 2020-07-04 DIAGNOSIS — F5101 Primary insomnia: Secondary | ICD-10-CM | POA: Diagnosis not present

## 2020-07-04 DIAGNOSIS — I679 Cerebrovascular disease, unspecified: Secondary | ICD-10-CM | POA: Diagnosis not present

## 2020-07-04 DIAGNOSIS — K700 Alcoholic fatty liver: Secondary | ICD-10-CM | POA: Diagnosis not present

## 2020-07-04 DIAGNOSIS — I5042 Chronic combined systolic (congestive) and diastolic (congestive) heart failure: Secondary | ICD-10-CM | POA: Diagnosis not present

## 2020-07-04 DIAGNOSIS — K7030 Alcoholic cirrhosis of liver without ascites: Secondary | ICD-10-CM | POA: Diagnosis not present

## 2020-07-04 DIAGNOSIS — J449 Chronic obstructive pulmonary disease, unspecified: Secondary | ICD-10-CM | POA: Diagnosis not present

## 2020-07-04 DIAGNOSIS — N401 Enlarged prostate with lower urinary tract symptoms: Secondary | ICD-10-CM | POA: Diagnosis not present

## 2020-07-04 NOTE — Telephone Encounter (Signed)
Pt scheduled for Office visit on 8/19.

## 2020-07-04 NOTE — Telephone Encounter (Signed)
Patient returned call, call transferred to [facility].

## 2020-07-05 DIAGNOSIS — F519 Sleep disorder not due to a substance or known physiological condition, unspecified: Secondary | ICD-10-CM | POA: Diagnosis not present

## 2020-07-05 DIAGNOSIS — E875 Hyperkalemia: Secondary | ICD-10-CM | POA: Diagnosis not present

## 2020-07-05 DIAGNOSIS — R600 Localized edema: Secondary | ICD-10-CM | POA: Diagnosis not present

## 2020-07-05 DIAGNOSIS — Z720 Tobacco use: Secondary | ICD-10-CM | POA: Diagnosis not present

## 2020-07-05 DIAGNOSIS — I10 Essential (primary) hypertension: Secondary | ICD-10-CM | POA: Diagnosis not present

## 2020-07-05 DIAGNOSIS — Z96611 Presence of right artificial shoulder joint: Secondary | ICD-10-CM | POA: Diagnosis not present

## 2020-07-05 DIAGNOSIS — G8929 Other chronic pain: Secondary | ICD-10-CM | POA: Diagnosis not present

## 2020-07-05 DIAGNOSIS — I739 Peripheral vascular disease, unspecified: Secondary | ICD-10-CM | POA: Diagnosis not present

## 2020-07-05 DIAGNOSIS — I5022 Chronic systolic (congestive) heart failure: Secondary | ICD-10-CM | POA: Diagnosis not present

## 2020-07-05 DIAGNOSIS — M25511 Pain in right shoulder: Secondary | ICD-10-CM | POA: Diagnosis not present

## 2020-07-05 DIAGNOSIS — D509 Iron deficiency anemia, unspecified: Secondary | ICD-10-CM | POA: Diagnosis not present

## 2020-07-06 ENCOUNTER — Ambulatory Visit (INDEPENDENT_AMBULATORY_CARE_PROVIDER_SITE_OTHER): Payer: Medicare Other | Admitting: *Deleted

## 2020-07-06 DIAGNOSIS — I255 Ischemic cardiomyopathy: Secondary | ICD-10-CM

## 2020-07-07 LAB — CUP PACEART REMOTE DEVICE CHECK
Battery Remaining Longevity: 30 mo
Battery Remaining Percentage: 37 %
Battery Voltage: 2.89 V
Brady Statistic AP VP Percent: 29 %
Brady Statistic AP VS Percent: 1 %
Brady Statistic AS VP Percent: 69 %
Brady Statistic AS VS Percent: 1 %
Brady Statistic RA Percent Paced: 29 %
Date Time Interrogation Session: 20210812020016
HighPow Impedance: 83 Ohm
HighPow Impedance: 83 Ohm
Implantable Lead Implant Date: 20160923
Implantable Lead Implant Date: 20160923
Implantable Lead Implant Date: 20160923
Implantable Lead Location: 753858
Implantable Lead Location: 753859
Implantable Lead Location: 753860
Implantable Lead Model: 7122
Implantable Pulse Generator Implant Date: 20160923
Lead Channel Impedance Value: 410 Ohm
Lead Channel Impedance Value: 460 Ohm
Lead Channel Impedance Value: 680 Ohm
Lead Channel Pacing Threshold Amplitude: 0.75 V
Lead Channel Pacing Threshold Amplitude: 1 V
Lead Channel Pacing Threshold Amplitude: 1 V
Lead Channel Pacing Threshold Pulse Width: 0.5 ms
Lead Channel Pacing Threshold Pulse Width: 0.5 ms
Lead Channel Pacing Threshold Pulse Width: 0.8 ms
Lead Channel Sensing Intrinsic Amplitude: 12 mV
Lead Channel Sensing Intrinsic Amplitude: 4 mV
Lead Channel Setting Pacing Amplitude: 2 V
Lead Channel Setting Pacing Amplitude: 2 V
Lead Channel Setting Pacing Amplitude: 2.25 V
Lead Channel Setting Pacing Pulse Width: 0.5 ms
Lead Channel Setting Pacing Pulse Width: 0.8 ms
Lead Channel Setting Sensing Sensitivity: 0.5 mV
Pulse Gen Serial Number: 7294918

## 2020-07-08 NOTE — Progress Notes (Signed)
Remote ICD transmission.   

## 2020-07-13 ENCOUNTER — Encounter: Payer: Self-pay | Admitting: Internal Medicine

## 2020-07-13 ENCOUNTER — Other Ambulatory Visit: Payer: Self-pay

## 2020-07-13 ENCOUNTER — Ambulatory Visit (INDEPENDENT_AMBULATORY_CARE_PROVIDER_SITE_OTHER): Payer: Medicare Other | Admitting: Internal Medicine

## 2020-07-13 VITALS — BP 142/68 | HR 64 | Ht 63.0 in | Wt 143.0 lb

## 2020-07-13 DIAGNOSIS — I5022 Chronic systolic (congestive) heart failure: Secondary | ICD-10-CM | POA: Diagnosis not present

## 2020-07-13 DIAGNOSIS — I255 Ischemic cardiomyopathy: Secondary | ICD-10-CM

## 2020-07-13 DIAGNOSIS — Z95810 Presence of automatic (implantable) cardiac defibrillator: Secondary | ICD-10-CM | POA: Diagnosis not present

## 2020-07-13 DIAGNOSIS — I779 Disorder of arteries and arterioles, unspecified: Secondary | ICD-10-CM

## 2020-07-13 LAB — CUP PACEART INCLINIC DEVICE CHECK
Battery Remaining Longevity: 30 mo
Brady Statistic RA Percent Paced: 27 %
Brady Statistic RV Percent Paced: 98 %
Date Time Interrogation Session: 20210818113856
HighPow Impedance: 72 Ohm
Implantable Lead Implant Date: 20160923
Implantable Lead Implant Date: 20160923
Implantable Lead Implant Date: 20160923
Implantable Lead Location: 753858
Implantable Lead Location: 753859
Implantable Lead Location: 753860
Implantable Lead Model: 7122
Implantable Pulse Generator Implant Date: 20160923
Lead Channel Impedance Value: 412.5 Ohm
Lead Channel Impedance Value: 475 Ohm
Lead Channel Impedance Value: 650 Ohm
Lead Channel Pacing Threshold Amplitude: 0.75 V
Lead Channel Pacing Threshold Amplitude: 0.875 V
Lead Channel Pacing Threshold Amplitude: 1.25 V
Lead Channel Pacing Threshold Pulse Width: 0.5 ms
Lead Channel Pacing Threshold Pulse Width: 0.5 ms
Lead Channel Pacing Threshold Pulse Width: 0.8 ms
Lead Channel Sensing Intrinsic Amplitude: 12 mV
Lead Channel Sensing Intrinsic Amplitude: 3.7 mV
Lead Channel Setting Pacing Amplitude: 2 V
Lead Channel Setting Pacing Amplitude: 2 V
Lead Channel Setting Pacing Amplitude: 2.25 V
Lead Channel Setting Pacing Pulse Width: 0.5 ms
Lead Channel Setting Pacing Pulse Width: 0.8 ms
Lead Channel Setting Sensing Sensitivity: 1 mV
Pulse Gen Serial Number: 7294918

## 2020-07-13 NOTE — Patient Instructions (Signed)
Medication Instructions:  Your physician recommends that you continue on your current medications as directed. Please refer to the Current Medication list given to you today.  *If you need a refill on your cardiac medications before your next appointment, please call your pharmacy*   Lab Work: NONE   If you have labs (blood work) drawn today and your tests are completely normal, you will receive your results only by: . MyChart Message (if you have MyChart) OR . A paper copy in the mail If you have any lab test that is abnormal or we need to change your treatment, we will call you to review the results.   Testing/Procedures: NONE    Follow-Up: At [facility], you and your health needs are our priority.  As part of our continuing mission to provide you with exceptional heart care, we have created designated Provider Care Teams.  These Care Teams include your primary Cardiologist (physician) and Advanced Practice Providers (APPs -  Physician Assistants and Nurse Practitioners) who all work together to provide you with the care you need, when you need it.  We recommend signing up for the patient portal called "MyChart".  Sign up information is provided on this After Visit Summary.  MyChart is used to connect with patients for Virtual Visits (Telemedicine).  Patients are able to view lab/test results, encounter notes, upcoming appointments, etc.  Non-urgent messages can be sent to your provider as well.   To learn more about what you can do with MyChart, go to [email or web address].    Your next appointment:   1 year(s)  The format for your next appointment:   In Person  Provider:   Gregg Taylor, MD   Other Instructions Thank you for choosing [facility]!    

## 2020-07-13 NOTE — Progress Notes (Signed)
HPI Roy [name] returns today for ongoing followup and trouble shooting of his ICD. He has a h/o an ICM, chronic systolic heart failure, s/p Biv ICD insertion. He has developed some noise on his ICD lead. He is a primary prevention patient and never had an appropriate ICD therapy. He has multiple noise reversions noted on his device interrogation.  No Known Allergies   Current Outpatient Medications  Medication Sig Dispense Refill  . acetaminophen (TYLENOL) 500 MG tablet Take 1,000 mg by mouth as needed for moderate pain or headache.     [name] aspirin EC 81 MG tablet Take 81 mg by mouth every evening.     [name] atorvastatin (LIPITOR) 20 MG tablet Take 20 mg by mouth daily.     [name] BRILINTA 90 MG TABS tablet Take 90 mg by mouth 2 (two) times daily.    . carvedilol (COREG) 3.125 MG tablet TAKE 1 TABLET TWICE A DAY 180 tablet 0  . dextromethorphan-guaiFENesin (MUCINEX DM) 30-600 MG 12hr tablet Take 1 tablet by mouth daily.     [name] ENTRESTO 49-51 MG TAKE 1 TABLET TWICE A DAY 180 tablet 4  . ferrous sulfate 324 MG TBEC Take 65 mg by mouth 3 (three) times a week. Take Monday,Wednesday and Friday    . gabapentin (NEURONTIN) 300 MG capsule Take 300 mg by mouth 2 (two) times daily.     . magnesium oxide (MAG-OX) 400 MG tablet Take 400 mg by mouth daily.    . Multiple Vitamins-Minerals (CENTRUM SILVER PO) Take 1 tablet by mouth daily. 50+    . omega-3 acid ethyl esters (LOVAZA) 1 g capsule Take 1 g by mouth daily.     [name] oxyCODONE-acetaminophen (PERCOCET/ROXICET) 5-325 MG tablet Take 1 tablet by mouth daily as needed.    . SUPER B COMPLEX/C PO Take 1 tablet by mouth at bedtime.     . tamsulosin (FLOMAX) 0.4 MG CAPS capsule Take 0.4 mg by mouth 2 (two) times daily.     . traMADol (ULTRAM) 50 MG tablet Take 50 mg by mouth as needed.     No current facility-administered medications for this visit.     Past Medical History:  Diagnosis Date  . AICD (automatic cardioverter/defibrillator) present 08/19/2015     St Jude BiV ICD for primary prevention by Dr. Lovena Le  . Alcohol abuse    6 beers per day; hospital admission in 2009 for withdrawal symptoms  . Alcoholic cirrhosis ([place])   . Anxiety and depression    denies   . Cerebrovascular disease 2009   TIA; 2009- right ICA stent; re-intervention for restenosis complicated by [facility] w/o sx  . CHF (congestive heart failure) ([place]) 06-02-14  . Chronic obstructive pulmonary disease ([place])   . Degenerative joint disease    Total shoulder arthroplasty-right  . Hyperlipidemia   . Hypertension   . LBBB (left bundle branch block)    Normal echo-2011; stress nuclear in 09/2010--septal hypoperfusion representing nontransmural infarction or the effect of left bundle branch block, no ischemia  . Myocardial infarction [facility]) June 02, 2014   Massive Heart Attack  . Peripheral vascular disease ([place])   . 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  . Thrombocytopenia ([place])   . Tobacco abuse    -100 pack years; 1.5 packs per day  . Traumatic seroma of thigh ([place])    left  . Tubular adenoma of colon     ROS:   All systems reviewed and negative except as noted in  the HPI.   Past Surgical History:  Procedure Laterality Date  . ABDOMINAL AORTAGRAM [name] 05/04/2014   Procedure: ABDOMINAL Maxcine Ham;  Surgeon: [name], MD;  Location: [facility] CATH LAB;  Service: Cardiovascular;  Laterality: [name];  . ABDOMINAL AORTOGRAM [name] 06/25/2017   Procedure: Abdominal Aortogram;  Surgeon: [name], MD;  Location: [place] CV LAB;  Service: Cardiovascular;  Laterality: [name];  . ABDOMINAL AORTOGRAM W/LOWER EXTREMITY [name] 04/22/2018   Procedure: ABDOMINAL AORTOGRAM W/LOWER EXTREMITY;  Surgeon: [name], MD;  Location: [place] CV LAB;  Service: Cardiovascular;  Laterality: [name];  . ABDOMINAL AORTOGRAM W/LOWER EXTREMITY [name] 03/08/2020   Procedure: ABDOMINAL AORTOGRAM W/LOWER EXTREMITY;  Surgeon: [name], MD;  Location: [place] CV LAB;  Service:  Cardiovascular;  Laterality: [name];  . AGILE CAPSULE [name] 03/18/2014   Procedure: AGILE CAPSULE;  Surgeon: Danie Binder, MD;  [facility];  Service: Endoscopy;  Laterality: [name];  7:30  . APPLICATION OF WOUND VAC Left 09/03/2017   Procedure: APPLICATION OF WOUND VAC;  Surgeon: Conrad [place], MD;  Location: [place];  Service: Vascular;  Laterality: Left;  . APPLICATION OF WOUND VAC Right 04/07/2020   Procedure: PLACEMENT OF ANTIBIOTIC BEADS AND APPLICATION OF WOUND VAC;  Surgeon: [name], MD;  Location: [place];  Service: Vascular;  Laterality: Right;  . BACK SURGERY    . BACTERIAL OVERGROWTH TEST [name] 05/24/2015   Procedure: BACTERIAL OVERGROWTH TEST;  Surgeon: Danie Binder, MD;  [facility];  Service: Endoscopy;  Laterality: [name];  0700  . BI-VENTRICULAR IMPLANTABLE CARDIOVERTER DEFIBRILLATOR  (CRT-D)  08/19/2015  . CARPAL TUNNEL RELEASE Left 02/02/2016   Procedure: LEFT CARPAL TUNNEL RELEASE;  Surgeon: Daryll Brod, MD;  Location: [place];  Service: Orthopedics;  Laterality: Left;  ANESTHESIA: IV REGIONAL UPPER ARM  . CATARACT EXTRACTION W/PHACO Right 03/08/2015   Procedure: CATARACT EXTRACTION PHACO AND INTRAOCULAR LENS PLACEMENT (IOC);  Surgeon: Rutherford Guys, MD;  Location: [facility];  Service: Ophthalmology;  Laterality: Right;  CDE:9.46  . CATARACT EXTRACTION W/PHACO Left 03/22/2015   Procedure: CATARACT EXTRACTION PHACO AND INTRAOCULAR LENS PLACEMENT (IOC);  Surgeon: Rutherford Guys, MD;  [facility]: [facility];  Service: Ophthalmology;  Laterality: Left;  CDE:5.80  . COLONOSCOPY  08/22/09   Fields-(Tubular Adenoma)3-mm transverse polyp/4-mm polyp otherwise noraml/small internal hemorrhoids  . COLONOSCOPY [name] 04/14/2014   LPF:XTKWI internal hemorrhids/normal mocsa in the terminal iluem/left colonis redundant  . COLONOSCOPY W/ POLYPECTOMY  2011  . ENDARTERECTOMY FEMORAL Left 08/16/2017   Procedure: ENDARTERECTOMY LEFT PROFUNDA FEMORAL;  Surgeon: [name], MD;   Location: [place];  Service: Vascular;  Laterality: Left;  . ENDARTERECTOMY FEMORAL Right 03/31/2020   Procedure: RIGHT FEMORAL ENDARTERECTOMY;  Surgeon: [name], MD;  Location: [place];  Service: Vascular;  Laterality: Right;  . EP IMPLANTABLE DEVICE [name] 08/19/2015   Procedure: BiV ICD Insertion CRT-D;  Surgeon: Evans Lance, MD;  Location: [place] CV LAB;  Service: Cardiovascular;  Laterality: [name];  . ESOPHAGOGASTRODUODENOSCOPY [name] 03/05/2014   SLF: 1. Stricture at the gastroesophagael junction 2. Small hiatal hernia 3. Moderate non-erosive gastritis and duodentitis. 4. No surce for Melena identified.   . FEMORAL-POPLITEAL BYPASS GRAFT Left 08/16/2017   Procedure: LEFT  FEMORAL-POPLITEAL ARTERY  BYPASS GRAFT;  Surgeon: [name], MD;  Location: [place];  Service: Vascular;  Laterality: Left;  . GIVENS CAPSULE STUDY [name] 03/30/2014   Procedure: GIVENS CAPSULE STUDY;  Surgeon: Danie Binder, MD;  [facility];  Service: Endoscopy;  Laterality: [name];  7:30  . GROIN DEBRIDEMENT Right 04/07/2020   Procedure: INCISION AND DRAINAGE OF RIGHT GROIN;  Surgeon: [name], MD;  [facility] OR;  Service: Vascular;  Laterality: Right;  . I & D EXTREMITY Left 09/03/2017   Procedure: IRRIGATION AND DEBRIDEMENT EXTREMITY LEFT THIGH SEROMA;  Surgeon: Conrad [place], MD;  Location: [place];  Service: Vascular;  Laterality: Left;  . INSERT / REPLACE / REMOVE PACEMAKER    . INSERTION OF ILIAC STENT  03/31/2020   Procedure: INSERTION OF RIGHT ILIAC ARTERY STENT AND RIGHT SUPERFICIAL FEMORAL ARTERY STENT;  Surgeon: [name], MD;  [facility] OR;  Service: Vascular;;  . IR ANGIO INTRA EXTRACRAN SEL COM CAROTID INNOMINATE BILAT MOD SED  07/16/2017  . IR ANGIO INTRA EXTRACRAN SEL COM CAROTID INNOMINATE BILAT MOD SED  06/12/2019  . IR ANGIO VERTEBRAL SEL SUBCLAVIAN INNOMINATE BILAT MOD SED  07/16/2017  . IR ANGIO VERTEBRAL SEL SUBCLAVIAN INNOMINATE BILAT MOD SED  06/12/2019  . IR RADIOLOGIST  EVAL & MGMT  04/01/2017  . IR RADIOLOGIST EVAL & MGMT  08/02/2017  . IR TRANSCATH EXCRAN VERT OR CAR A STENT  07/22/2019  . IR US GUIDE VASC ACCESS RIGHT  06/12/2019  . JOINT REPLACEMENT Right    Total Shoulder Replacement   . LOWER EXTREMITY ANGIOGRAPHY Bilateral 06/25/2017   Procedure: Lower Extremity Angiography;  Surgeon: [name], MD;  Location: [place] CV LAB;  Service: Cardiovascular;  Laterality: Bilateral;  . LUMBAR FUSION  2010  . PATCH ANGIOPLASTY Right 03/31/2020   Procedure: PATCH ANGIOPLASTY USING XENOSURE BIOLOGIC PATCH 1cm x 14cm;  Surgeon: [name], MD;  [facility]: [facility] OR;  Service: Vascular;  Laterality: Right;  . PERIPHERAL VASCULAR ATHERECTOMY Right 04/22/2018   Procedure: PERIPHERAL VASCULAR ATHERECTOMY;  Surgeon: [name], MD;  Location: [place] CV LAB;  Service: Cardiovascular;  Laterality: Right;  right superficial femoral  . PERIPHERAL VASCULAR BALLOON ANGIOPLASTY Right 04/22/2018   Procedure: PERIPHERAL VASCULAR BALLOON ANGIOPLASTY;  Surgeon: [name], MD;  Location: [place] CV LAB;  Service: Cardiovascular;  Laterality: Right;  external iliac  . PERIPHERAL VASCULAR BALLOON ANGIOPLASTY Left 03/08/2020   Procedure: PERIPHERAL VASCULAR BALLOON ANGIOPLASTY;  Surgeon: [name], MD;  Location: [place] CV LAB;  Service: Cardiovascular;  Laterality: Left;  Common Femoral  . PERIPHERAL VASCULAR INTERVENTION Right 03/08/2020   Procedure: PERIPHERAL VASCULAR INTERVENTION;  Surgeon: [name], MD;  Location: [place] CV LAB;  Service: Cardiovascular;  Laterality: Right;  SFA  . RADIOLOGY WITH ANESTHESIA [name] 07/01/2019   Procedure: STENTING;  Surgeon: Luanne Bras, MD;  Location: [place];  Service: Radiology;  Laterality: [name];  . RADIOLOGY WITH ANESTHESIA [name] 07/22/2019   Procedure: STENTING;  Surgeon: Luanne Bras, MD;  Location: [place];  Service: Radiology;  Laterality: [name];  . TOTAL SHOULDER ARTHROPLASTY Right 2011    Dr. Tamera Punt  . ULNAR NERVE TRANSPOSITION Left 02/02/2016   Procedure: LEFT IN-SITU DECOMPRESSION ULNAR NERVE ;  Surgeon: Daryll Brod, MD;  Location: [place];  Service: Orthopedics;  Laterality: Left;  . ULNAR TUNNEL RELEASE Left 02/02/2016   Procedure: LEFT CUBITAL TUNNEL RELEASE;  Surgeon: Daryll Brod, MD;  Location: [place];  Service: Orthopedics;  Laterality: Left;  [name] VASECTOMY  1971  . VEIN HARVEST Left 08/16/2017   Procedure: USING NON REVERSE LEFT GREATER SAPHENOUS VEIN HARVEST;  Surgeon: [name], MD;  [facility];  Service: Vascular;  Laterality: Left;     Family History  Problem Relation Age of Onset  . Coronary artery disease Mother   . Diabetes Mother   . Heart disease Mother        Before age 44 - 52 Bypasses  . Hypertension Mother   . Heart attack Mother        3-4 Heart attacks  . Alzheimer's disease Father   . Diabetes Father   . Heart attack Sister   . Cancer Brother        Prostate  . Hyperlipidemia Son   . Prostate cancer Brother   . Alzheimer's disease Sister   . Colon cancer Neg Hx      Social History   Socioeconomic History  . Marital status: Married    Spouse name: Not on file  . Number of children: 2  . Years of education: Not on file  . Highest education level: Not on file  Occupational History  . Occupation: retired, Clinical biochemist    Comment: Programmer, systems: RETIRED  Tobacco Use  . Smoking status: Current Every Day Smoker    Packs/day: 1.00    Years: 60.00    Pack years: 60.00    Types: Cigarettes    Start date: 08/20/1957  . Smokeless tobacco: Never Used  Vaping Use  . Vaping Use: Former  . Start date: 09/10/2016  . Quit date: 10/11/2016  Substance and Sexual Activity  . Alcohol use: No    Alcohol/week: 0.0 standard drinks    Comment: quit in July 2015  . Drug use: No  . Sexual activity: Not Currently  Other Topics Concern  . Not on file  Social History Narrative   Accompanied by  daughter Roy [name]   Lives w/ wife, daughter   Social Determinants of Health   Financial Resource Strain:   . Difficulty of Paying Living Expenses:   Food Insecurity:   . Worried About Charity fundraiser in the Last Year:   . Arboriculturist in the Last Year:   Transportation Needs:   . Film/video editor (Medical):   [name] Lack of Transportation (Non-Medical):   Physical Activity:   . Days of Exercise per Week:   . Minutes of Exercise per Session:   Stress:   . Feeling of Stress :   Social Connections:   . Frequency of Communication with Friends and Family:   . Frequency of Social Gatherings with Friends and Family:   . Attends Religious Services:   . Active Member of Clubs or Organizations:   . Attends Archivist Meetings:   [name] Marital Status:   Intimate Partner Violence:   . Fear of Current or Ex-Partner:   . Emotionally Abused:   [name] Physically Abused:   . Sexually Abused:      BP (!) 142/68   Pulse 64   Ht 5\' 3"  (1.6 m)   Wt 143 lb (64.9 kg)   SpO2 97%   BMI 25.33 kg/m   Physical Exam:  Well appearing NAD HEENT: Unremarkable Neck:  No JVD, no thyromegally Lymphatics:  No adenopathy Back:  No CVA tenderness Lungs:  Clear with no wheezes HEART:  Regular rate rhythm, no murmurs, no rubs, no clicks Abd:  soft, positive bowel sounds, no organomegally, no rebound, no guarding Ext:  2 plus pulses, no edema, no cyanosis, no clubbing Skin:  No rashes no nodules Neuro:  CN II through XII  intact, motor grossly intact  DEVICE  Normal device function.  See PaceArt for details. ICD lead noise noted  Assess/Plan: 1. ICD lead noise - we have reduced his sensitivity today. The noise no longer appears present. He will undergo watchful waiting. I gave him a magnet with instructions on what to do if his ICD should fire.If he has more noise, we would consider ICD lead removal and insertion of a new system.   2. Chronic systolic heart failure - his symptoms are  class 2. He will continue his current meds. 3. CAD - he denies anginal symptoms.  4. Peripheral vascular disease - he is limited in activity by claudication. He is encouraged to stop smoking.  Carleene Overlie [place],MD

## 2020-07-14 ENCOUNTER — Ambulatory Visit (HOSPITAL_COMMUNITY)
Admission: RE | Admit: 2020-07-14 | Discharge: 2020-07-14 | Disposition: A | Discharge status: Home / Self Care | Payer: Medicare Other | Source: Ambulatory Visit | Attending: Interventional Radiology | Admitting: Interventional Radiology

## 2020-07-14 DIAGNOSIS — I671 Cerebral aneurysm, nonruptured: Secondary | ICD-10-CM | POA: Diagnosis not present

## 2020-07-14 DIAGNOSIS — I6523 Occlusion and stenosis of bilateral carotid arteries: Secondary | ICD-10-CM | POA: Diagnosis not present

## 2020-07-14 LAB — POCT I-STAT CREATININE: Creatinine, Ser: 0.7 mg/dL (ref 0.61–1.24)

## 2020-07-14 MED ORDER — IOHEXOL 350 MG/ML SOLN
50.0000 mL | Freq: Once | INTRAVENOUS | Status: AC | PRN
  Administered 2020-07-14: 50 mL via INTRAVENOUS

## 2020-07-18 ENCOUNTER — Telehealth (HOSPITAL_COMMUNITY): Payer: Self-pay

## 2020-07-18 NOTE — Telephone Encounter (Signed)
Pt agreed to f/u in 6 months with diagnostic angiogram. AW 

## 2020-07-18 NOTE — Telephone Encounter (Signed)
Called pt regarding recent cta, no answer, left vm. AW 

## 2020-08-04 NOTE — Progress Notes (Signed)
HISTORY AND PHYSICAL     CC:  follow up. Requesting Provider:  Celene Squibb, MD  HPI: This is a 74 y.o. male who is here today for follow up for PAD.  He is s/pright iliofemoral endarterectomy with patch angioplasty as well as common iliac and superficial femoral artery stenting on 5/6/2021by Dr. 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 This was treated with antibiotic beads and a wound VAC. Cultures were negative.He followed up on 05/02/20 with Dr. [name] for wound check and the wound VAC was discontinued and patient was instructed on wet to dry packing changes.   Pt has hx of bilateral EIA stenting 05/04/2014.  He also underwent left femoral to above knee popliteal bypass with GSV with extensive left profunda femoral endarterectomy and left EIA endarterectomy on 08/16/2017.  In May 2019, he underwent atherectomy and drug coated angioplasty of the right SFA and right EIA.  On 03/08/2020, he underwent drug coated balloon angioplasty left CFA and stent of the right SFA.  Dr. [name] is his surgeon.   Pt was last seen 06/06/2020 and at that time, he was doing well without pain or drainage, redness or warmth, fever or chills.  He denied claudication, rest pain or non healing wounds. His wound was healed and no longer needed dressing changes.  He was scheduled for follow up for surveillance and he is here today for that visit.  The pt comes in today for follow up.  He states that he is doing well and denies any claudication, rest pain or non healing wounds.  He states he had dye used a few weeks ago to look at his brain for aneurysm.  CTA head/neck 07/14/2020 by Dr. Estanislado Pandy: IMPRESSION: 1. Severely limited CTA due to bolus timing/quality. The left side was injected and there is left brachiocephalic stenosis from pacer wires. In the future, recommend right-sided injection. 2. Estimated 70% left  proximal ICA stenosis. 3. Estimated 50% right proximal ICA stenosis. 4. Intracranial ICA stent on the right which is likely patent. There appears to be high-grade stenosis at the right M1 segment. 5. Estimated 60% left paraclinoid ICA stenosis. 6. The vertebral arteries are very poorly characterized due to limitations. Subjectively moderate proximal vertebral stenosis due to calcified plaque  He has hx of COPD, ICD, CHF.    The pt is on a statin for cholesterol management.    The pt is on an aspirin.    Other AC:  Brilinta The pt is on BB, ARB for hypertension.  The pt does not have diabetes. Tobacco hx:  current  Pt does not have family hx of AAA.  Past Medical History:  Diagnosis Date  . AICD (automatic cardioverter/defibrillator) present 08/19/2015   [employer] Jude BiV ICD for primary prevention by Dr. Lovena Le  . Alcohol abuse    6 beers per day; hospital admission in 2009 for withdrawal symptoms  . Alcoholic cirrhosis ([place])   . Anxiety and depression    denies   . Cerebrovascular disease 2009   TIA; 2009- right ICA stent; re-intervention for restenosis complicated by [facility] w/o sx  . CHF (congestive heart failure) ([place]) 06-02-14  . Chronic obstructive pulmonary disease ([place])   . Degenerative joint disease    Total shoulder arthroplasty-right  . Hyperlipidemia   . Hypertension   . LBBB (left bundle branch block)    Normal echo-2011; stress nuclear in 09/2010--septal hypoperfusion  representing nontransmural infarction or the effect of left bundle branch block, no ischemia  . Myocardial infarction [facility]) June 02, 2014   Massive Heart Attack  . Peripheral vascular disease ([place])   . 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  . Thrombocytopenia ([place])   . Tobacco abuse    -100 pack years; 1.5 packs per day  . Traumatic seroma of thigh ([place])    left  . Tubular adenoma of colon     Past Surgical History:  Procedure Laterality Date  . ABDOMINAL AORTAGRAM [name] 05/04/2014    Procedure: ABDOMINAL Maxcine Ham;  Surgeon: [name], MD;  Location: [facility] CATH LAB;  Service: Cardiovascular;  Laterality: [name];  . ABDOMINAL AORTOGRAM [name] 06/25/2017   Procedure: Abdominal Aortogram;  Surgeon: [name], MD;  Location: [place] CV LAB;  Service: Cardiovascular;  Laterality: [name];  . ABDOMINAL AORTOGRAM W/LOWER EXTREMITY [name] 04/22/2018   Procedure: ABDOMINAL AORTOGRAM W/LOWER EXTREMITY;  Surgeon: [name], MD;  Location: [place] CV LAB;  Service: Cardiovascular;  Laterality: [name];  . ABDOMINAL AORTOGRAM W/LOWER EXTREMITY [name] 03/08/2020   Procedure: ABDOMINAL AORTOGRAM W/LOWER EXTREMITY;  Surgeon: [name], MD;  Location: [place] CV LAB;  Service: Cardiovascular;  Laterality: [name];  . AGILE CAPSULE [name] 03/18/2014   Procedure: AGILE CAPSULE;  Surgeon: Danie Binder, MD;  [facility];  Service: Endoscopy;  Laterality: [name];  7:30  . APPLICATION OF WOUND VAC Left 09/03/2017   Procedure: APPLICATION OF WOUND VAC;  Surgeon: Conrad [place], MD;  Location: [place];  Service: Vascular;  Laterality: Left;  . APPLICATION OF WOUND VAC Right 04/07/2020   Procedure: PLACEMENT OF ANTIBIOTIC BEADS AND APPLICATION OF WOUND VAC;  Surgeon: [name], MD;  Location: [place];  Service: Vascular;  Laterality: Right;  . BACK SURGERY    . BACTERIAL OVERGROWTH TEST [name] 05/24/2015   Procedure: BACTERIAL OVERGROWTH TEST;  Surgeon: Danie Binder, MD;  [facility];  Service: Endoscopy;  Laterality: [name];  0700  . BI-VENTRICULAR IMPLANTABLE CARDIOVERTER DEFIBRILLATOR  (CRT-D)  08/19/2015  . CARPAL TUNNEL RELEASE Left 02/02/2016   Procedure: LEFT CARPAL TUNNEL RELEASE;  Surgeon: Daryll Brod, MD;  Location: [place];  Service: Orthopedics;  Laterality: Left;  ANESTHESIA: IV REGIONAL UPPER ARM  . CATARACT EXTRACTION W/PHACO Right 03/08/2015   Procedure: CATARACT EXTRACTION PHACO AND INTRAOCULAR LENS PLACEMENT (IOC);  Surgeon: Rutherford Guys, MD;   Location: [facility];  Service: Ophthalmology;  Laterality: Right;  CDE:9.46  . CATARACT EXTRACTION W/PHACO Left 03/22/2015   Procedure: CATARACT EXTRACTION PHACO AND INTRAOCULAR LENS PLACEMENT (IOC);  Surgeon: Rutherford Guys, MD;  Location: [facility];  Service: Ophthalmology;  Laterality: Left;  CDE:5.80  . COLONOSCOPY  08/22/09   Fields-(Tubular Adenoma)3-mm transverse polyp/4-mm polyp otherwise noraml/small internal hemorrhoids  . COLONOSCOPY [name] 04/14/2014   GMW:NUUVO internal hemorrhids/normal mocsa in the terminal iluem/left colonis redundant  . COLONOSCOPY W/ POLYPECTOMY  2011  . ENDARTERECTOMY FEMORAL Left 08/16/2017   Procedure: ENDARTERECTOMY LEFT PROFUNDA FEMORAL;  Surgeon: [name], MD;  Location: [place];  Service: Vascular;  Laterality: Left;  . ENDARTERECTOMY FEMORAL Right 03/31/2020   Procedure: RIGHT FEMORAL ENDARTERECTOMY;  Surgeon: [name], MD;  Location: [place];  Service: Vascular;  Laterality: Right;  . EP IMPLANTABLE DEVICE [name] 08/19/2015   Procedure: BiV ICD Insertion CRT-D;  Surgeon: Evans Lance, MD;  Location: [place] CV LAB;  Service: Cardiovascular;  Laterality: [name];  . ESOPHAGOGASTRODUODENOSCOPY [name] 03/05/2014  SLF: 1. Stricture at the gastroesophagael junction 2. Small hiatal hernia 3. Moderate non-erosive gastritis and duodentitis. 4. No surce for Melena identified.   . FEMORAL-POPLITEAL BYPASS GRAFT Left 08/16/2017   Procedure: LEFT  FEMORAL-POPLITEAL ARTERY  BYPASS GRAFT;  Surgeon: [name], MD;  Location: [place];  Service: Vascular;  Laterality: Left;  . GIVENS CAPSULE STUDY [name] 03/30/2014   Procedure: GIVENS CAPSULE STUDY;  Surgeon: Danie Binder, MD;  [facility];  Service: Endoscopy;  Laterality: [name];  7:30  . GROIN DEBRIDEMENT Right 04/07/2020   Procedure: INCISION AND DRAINAGE OF RIGHT GROIN;  Surgeon: [name], MD;  Location: [facility];  Service: Vascular;  Laterality: Right;  . I & D EXTREMITY Left 09/03/2017   Procedure:  IRRIGATION AND DEBRIDEMENT EXTREMITY LEFT THIGH SEROMA;  Surgeon: Conrad [place], MD;  Location: [place];  Service: Vascular;  Laterality: Left;  . INSERT / REPLACE / REMOVE PACEMAKER    . INSERTION OF ILIAC STENT  03/31/2020   Procedure: INSERTION OF RIGHT ILIAC ARTERY STENT AND RIGHT SUPERFICIAL FEMORAL ARTERY STENT;  Surgeon: [name], MD;  Location: [facility] OR;  Service: Vascular;;  . IR ANGIO INTRA EXTRACRAN SEL COM CAROTID INNOMINATE BILAT MOD SED  07/16/2017  . IR ANGIO INTRA EXTRACRAN SEL COM CAROTID INNOMINATE BILAT MOD SED  06/12/2019  . IR ANGIO VERTEBRAL SEL SUBCLAVIAN INNOMINATE BILAT MOD SED  07/16/2017  . IR ANGIO VERTEBRAL SEL SUBCLAVIAN INNOMINATE BILAT MOD SED  06/12/2019  . IR RADIOLOGIST EVAL & MGMT  04/01/2017  . IR RADIOLOGIST EVAL & MGMT  08/02/2017  . IR TRANSCATH EXCRAN VERT OR CAR A STENT  07/22/2019  . IR US GUIDE VASC ACCESS RIGHT  06/12/2019  . JOINT REPLACEMENT Right    Total Shoulder Replacement   . LOWER EXTREMITY ANGIOGRAPHY Bilateral 06/25/2017   Procedure: Lower Extremity Angiography;  Surgeon: [name], MD;  Location: [place] CV LAB;  Service: Cardiovascular;  Laterality: Bilateral;  . LUMBAR FUSION  2010  . PATCH ANGIOPLASTY Right 03/31/2020   Procedure: PATCH ANGIOPLASTY USING XENOSURE BIOLOGIC PATCH 1cm x 14cm;  Surgeon: [name], MD;  Location: [facility] OR;  Service: Vascular;  Laterality: Right;  . PERIPHERAL VASCULAR ATHERECTOMY Right 04/22/2018   Procedure: PERIPHERAL VASCULAR ATHERECTOMY;  Surgeon: [name], MD;  Location: [place] CV LAB;  Service: Cardiovascular;  Laterality: Right;  right superficial femoral  . PERIPHERAL VASCULAR BALLOON ANGIOPLASTY Right 04/22/2018   Procedure: PERIPHERAL VASCULAR BALLOON ANGIOPLASTY;  Surgeon: [name], MD;  Location: [place] CV LAB;  Service: Cardiovascular;  Laterality: Right;  external iliac  . PERIPHERAL VASCULAR BALLOON ANGIOPLASTY Left 03/08/2020   Procedure: PERIPHERAL VASCULAR  BALLOON ANGIOPLASTY;  Surgeon: [name], MD;  Location: [place] CV LAB;  Service: Cardiovascular;  Laterality: Left;  Common Femoral  . PERIPHERAL VASCULAR INTERVENTION Right 03/08/2020   Procedure: PERIPHERAL VASCULAR INTERVENTION;  Surgeon: [name], MD;  Location: [place] CV LAB;  Service: Cardiovascular;  Laterality: Right;  SFA  . RADIOLOGY WITH ANESTHESIA [name] 07/01/2019   Procedure: STENTING;  Surgeon: Luanne Bras, MD;  Location: [place];  Service: Radiology;  Laterality: [name];  . RADIOLOGY WITH ANESTHESIA [name] 07/22/2019   Procedure: STENTING;  Surgeon: Luanne Bras, MD;  Location: [place];  Service: Radiology;  Laterality: [name];  . TOTAL SHOULDER ARTHROPLASTY Right 2011   Dr. Tamera Punt  . ULNAR NERVE TRANSPOSITION Left 02/02/2016   Procedure: LEFT IN-SITU DECOMPRESSION ULNAR NERVE ;  Surgeon: Daryll Brod, MD;  Location: [place];  Service: Orthopedics;  Laterality: Left;  . ULNAR TUNNEL RELEASE Left 02/02/2016   Procedure: LEFT CUBITAL TUNNEL RELEASE;  Surgeon: Daryll Brod, MD;  Location: [place];  Service: Orthopedics;  Laterality: Left;  [name] VASECTOMY  1971  . VEIN HARVEST Left 08/16/2017   Procedure: USING NON REVERSE LEFT GREATER SAPHENOUS VEIN HARVEST;  Surgeon: [name], MD;  Location: [place];  Service: Vascular;  Laterality: Left;    No Known Allergies  Current Outpatient Medications  Medication Sig Dispense Refill  . acetaminophen (TYLENOL) 500 MG tablet Take 1,000 mg by mouth as needed for moderate pain or headache.     [name] aspirin EC 81 MG tablet Take 81 mg by mouth every evening.     [name] atorvastatin (LIPITOR) 20 MG tablet Take 20 mg by mouth daily.     [name] BRILINTA 90 MG TABS tablet Take 90 mg by mouth 2 (two) times daily.    . carvedilol (COREG) 3.125 MG tablet TAKE 1 TABLET TWICE A DAY 180 tablet 0  . dextromethorphan-guaiFENesin (MUCINEX DM) 30-600 MG 12hr tablet Take 1 tablet by mouth daily.     [name] ENTRESTO 49-51  MG TAKE 1 TABLET TWICE A DAY 180 tablet 4  . ferrous sulfate 324 MG TBEC Take 65 mg by mouth 3 (three) times a week. Take Monday,Wednesday and Friday    . gabapentin (NEURONTIN) 300 MG capsule Take 300 mg by mouth 2 (two) times daily.     . magnesium oxide (MAG-OX) 400 MG tablet Take 400 mg by mouth daily.    . Multiple Vitamins-Minerals (CENTRUM SILVER PO) Take 1 tablet by mouth daily. 50+    . omega-3 acid ethyl esters (LOVAZA) 1 g capsule Take 1 g by mouth daily.     [name] oxyCODONE-acetaminophen (PERCOCET/ROXICET) 5-325 MG tablet Take 1 tablet by mouth daily as needed.    . SUPER B COMPLEX/C PO Take 1 tablet by mouth at bedtime.     . tamsulosin (FLOMAX) 0.4 MG CAPS capsule Take 0.4 mg by mouth 2 (two) times daily.     . traMADol (ULTRAM) 50 MG tablet Take 50 mg by mouth as needed.     No current facility-administered medications for this visit.    Family History  Problem Relation Age of Onset  . Coronary artery disease Mother   . Diabetes Mother   . Heart disease Mother        Before age 24 - 3 Bypasses  . Hypertension Mother   . Heart attack Mother        3-4 Heart attacks  . Alzheimer's disease Father   . Diabetes Father   . Heart attack Sister   . Cancer Brother        Prostate  . Hyperlipidemia Son   . Prostate cancer Brother   . Alzheimer's disease Sister   . Colon cancer Neg Hx     Social History   Socioeconomic History  . Marital status: Married    Spouse name: Not on file  . Number of children: 2  . Years of education: Not on file  . Highest education level: Not on file  Occupational History  . Occupation: retired, Clinical biochemist    Comment: Programmer, systems: RETIRED  Tobacco Use  . Smoking status: Current Every Day Smoker    Packs/day: 1.00    Years: 60.00    Pack years: 60.00    Types:  Cigarettes    Start date: 08/20/1957  . Smokeless tobacco: Never Used  Vaping Use  . Vaping Use: Former  . Start date: 09/10/2016  . Quit date: 10/11/2016    Substance and Sexual Activity  . Alcohol use: No    Alcohol/week: 0.0 standard drinks    Comment: quit in July 2015  . Drug use: No  . Sexual activity: Not Currently  Other Topics Concern  . Not on file  Social History Narrative   Accompanied by daughter [name]   Lives w/ wife, daughter   Social Determinants of Health   Financial Resource Strain:   . Difficulty of Paying Living Expenses: Not on file  Food Insecurity:   . Worried About Charity fundraiser in the Last Year: Not on file  . Ran Out of Food in the Last Year: Not on file  Transportation Needs:   . Lack of Transportation (Medical): Not on file  . Lack of Transportation (Non-Medical): Not on file  Physical Activity:   . Days of Exercise per Week: Not on file  . Minutes of Exercise per Session: Not on file  Stress:   . Feeling of Stress : Not on file  Social Connections:   . Frequency of Communication with Friends and Family: Not on file  . Frequency of Social Gatherings with Friends and Family: Not on file  . Attends Religious Services: Not on file  . Active Member of Clubs or Organizations: Not on file  . Attends Archivist Meetings: Not on file  . Marital Status: Not on file  Intimate Partner Violence:   . Fear of Current or Ex-Partner: Not on file  . Emotionally Abused: Not on file  . Physically Abused: Not on file  . Sexually Abused: Not on file     REVIEW OF SYSTEMS:   [X]  denotes positive finding, [ ]  denotes negative finding Cardiac  Comments:  Chest pain or chest pressure:    Shortness of breath upon exertion:    Short of breath when lying flat:    Irregular heart rhythm:        Vascular    Pain in calf, thigh, or hip brought on by ambulation:    Pain in feet at night that wakes you up from your sleep:     Blood clot in your veins:    Leg swelling:         Pulmonary    COPD x       Neurologic    Sudden weakness in arms or legs:     Sudden numbness in arms or legs:      Sudden onset of difficulty speaking or slurred speech:    Temporary loss of vision in one eye:     Problems with dizziness:         Gastrointestinal    Blood in stool:     Vomited blood:         Genitourinary    Burning when urinating:     Blood in urine:        Psychiatric    Major depression:         Hematologic    Bleeding problems:    Problems with blood clotting too easily:        Skin    Rashes or ulcers:        Constitutional    Fever or chills:      PHYSICAL EXAMINATION:  Today's Vitals   08/08/20 0923  BP: 133/73  Pulse: 65  Resp: 20  Temp: 98.2 F (36.8 C)  TempSrc: Temporal  SpO2: 99%  Weight: 140 lb 3.2 oz (63.6 kg)  Height: 5\' 3"  (1.6 m)   Body mass index is 24.84 kg/m.   General:  WDWN in NAD; vital signs documented above Gait: Not observed HENT: WNL, normocephalic Pulmonary: normal non-labored breathing , without wheezing Cardiac: regular HR, without  Murmur; with left carotid bruit Abdomen: soft, NT, no masses Skin: without rashes; well healed incisions right groin and left groin and leg Vascular Exam/Pulses:  Right Left  Radial 2+ (normal) 2+ (normal)  Ulnar Unable to palpate Unable to palpate  Femoral 2+ (normal) 2+ (normal)  Popliteal Unable to palpate Unable to palpate  DP 2+ (normal) - AT 2+ (normal)  PT Unable to palpate Unable to palpate   Extremities: without ischemic changes, without Gangrene , without cellulitis; without open wounds;  Musculoskeletal: no muscle wasting or atrophy  Neurologic: A&O X 3;  No focal weakness or paresthesias are detected Psychiatric:  The pt has Normal affect.   Non-Invasive Vascular Imaging:   ABI's/TBI's on 08/08/2020: Right:  0.89/0.62 (B, T) - Great toe pressure: 77 Left:  0.87/0.50 (M, B)  - Great toe pressure: 62  Arterial duplex on 08/08/2020: +----------+--------+-----+--------+--------+--------+  RIGHT   PSV cm/sRatioStenosisWaveformComments   +----------+--------+-----+--------+--------+--------+  CIA Prox 141          biphasic      +----------+--------+-----+--------+--------+--------+  CIA Mid  183          biphasic      +----------+--------+-----+--------+--------+--------+  CIA Distal163          biphasic      +----------+--------+-----+--------+--------+--------+    Right Stent(s):  +---------------+--------+--------+--------+--------+  Prox SFA    PSV cm/sStenosisWaveformComments  +---------------+--------+--------+--------+--------+  Prox to Stent 97       biphasic      +---------------+--------+--------+--------+--------+  Proximal Stent 132       biphasic      +---------------+--------+--------+--------+--------+  Mid Stent   59       biphasic      +---------------+--------+--------+--------+--------+  Distal Stent  54       biphasic      +---------------+--------+--------+--------+--------+  Distal to Stent110       biphasic      +---------------+--------+--------+--------+--------+    +----------+--------+-----+---------------+--------+--------+  LEFT   PSV cm/sRatioStenosis    WaveformComments  +----------+--------+-----+---------------+--------+--------+  CIA Prox 39              biphasic      +----------+--------+-----+---------------+--------+--------+  CIA Mid  479      75-99% stenosisbiphasic      +----------+--------+-----+---------------+--------+--------+  CIA Distal43              biphasic      +----------+--------+-----+---------------+--------+--------+     Left Graft #1: +--------------------+--------+--------+--------+--------+            PSV cm/sStenosisWaveformComments  +--------------------+--------+--------+--------+--------+  Inflow        19       biphasic      +--------------------+--------+--------+--------+--------+  Proximal Anastomosis42       biphasic      +--------------------+--------+--------+--------+--------+  Proximal Graft   58       biphasic      +--------------------+--------+--------+--------+--------+  Mid Graft      44       biphasic      +--------------------+--------+--------+--------+--------+  Distal Graft    40       biphasic      +--------------------+--------+--------+--------+--------+  Distal Anastomosis 50       biphasic      +--------------------+--------+--------+--------+--------+  Outflow       88       biphasic      +--------------------+--------+--------+--------+--------+  Summary:  Right: Patent right SFA stent with no evidence of restenosis.   Left: 75-99% stenosis noted in the common femoral artery. >75% Left CFA stenosis. Patent left fem-pop bypass graft with no evidence of stenosis.   Abdominal Aorta Findings:  +-----------+-------+----------+----------+--------+--------+--------+  Location  AP (cm)Trans (cm)PSV (cm/s)WaveformThrombusComments  +-----------+-------+----------+----------+--------+--------+--------+  Proximal  1.69  1.77   79                  +-----------+-------+----------+----------+--------+--------+--------+  Distal   1.64  1.54   56                  +-----------+-------+----------+----------+--------+--------+--------+  LT CIA Prox72.3            biphasic          +-----------+-------+----------+----------+--------+--------+--------+      Right Stent(s):  +---------------+--------+---------------+----------+--------+  Common iliac  PSV cm/sStenosis    Waveform Comments   +---------------+--------+---------------+----------+--------+  Prox to Stent 95           monophasic      +---------------+--------+---------------+----------+--------+  Proximal Stent 79           biphasic       +---------------+--------+---------------+----------+--------+  Mid Stent   80           biphasic       +---------------+--------+---------------+----------+--------+  Distal Stent  308   50-99% stenosismonophasic      +---------------+--------+---------------+----------+--------+  Distal to Stent297           monophasic      +---------------+--------+---------------+----------+--------+   >50% Distal common iliac artery stent stenosis.    Left Stent(s):  +---------------+--------+--------+--------+--------+  External iliac PSV cm/sStenosisWaveformComments  +---------------+--------+--------+--------+--------+  Prox to Stent 84       biphasic      +---------------+--------+--------+--------+--------+  Proximal Stent 121       biphasic      +---------------+--------+--------+--------+--------+  Mid Stent   97       biphasic      +---------------+--------+--------+--------+--------+  Distal Stent  109       biphasic      +---------------+--------+--------+--------+--------+  Distal to Stent120       biphasic      +---------------+--------+--------+--------+--------+   Proximal branch through stent (Internal iliac artery?) 300 cm/sec peak  systolic velocity.   Previous ABI's/TBI's on 03/03/2020: Right:  0.57/0.49 (M) - Great toe pressure: 76 Left:  0.88/0.74 (T) - Great toe pressure:  114  Previous arterial duplex on 03/03/2020: Summary:  Right: 50-74% stenosis noted in the common femoral artery. 50-74% stenosis noted in the deep femoral artery. 50-74% stenosis noted in the superficial  femoral artery. Unable to duplicate elevated velocities in the 75-99% stenosis range as noted on prior exam. Elevated velocities may be obscured by calcified plaque.   Left: Patent left femoropoliteal bypass graft without evidence of ingraft  stenosis.  The inflow artery demonstrates elevated velocities indicating a stenosis  of 50-74%.  Aortogram 03/08/2020: Findings:                Aortogram: No significant renal artery stenosis.  The infrarenal abdominal aorta is calcified without significant stenosis.  There is a 50% stenosis at the origin of the right common iliac artery.  The stent within the right external iliac artery is patent throughout its course.  The left common iliac artery is calcified but without significant stenosis.  The left external iliac artery and stent are patent throughout their course.               Right Lower Extremity: The right common femoral artery is heavily calcified with approximate 50% stenosis.  The profunda origin is occluded.  The superficial femoral artery is patent with tandem greater than 80% stenoses.  The popliteal artery is patent throughout its course with three-vessel runoff.               Left Lower Extremity: The common femoral artery at the level of inguinal ligament shows approximately a 70% stenosis.  There is patulous dilatation of the left common femoral artery consistent with prior endarterectomy.  The profundofemoral arteries widely patent.  Bypass graft is visualized with anastomosis to the above-knee popliteal artery which is widely patent.  Peroneal artery is the dominant runoff.    ASSESSMENT/PLAN:: 74 y.o. male here for follow up for PAD with hx of right iliofemoral endarterectomy with patch angioplasty as well as common iliac and superficial femoral artery stenting on 5/6/2021by Dr. 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  Also has hx of  stenting  And drug coated balloon angioplasty of right SFA on 03/08/2020 by Dr. [name] as well as hx of left femoral to popliteal bypass grafting with GSV  PAD -pt with palpable right AT pulse and left DP pulse, however, pt has elevated velocities in the left CFA suggestive of 75-99% stenosis.  He has has elevated velocities in the right distal CIA stent.  Discussed findings with Dr. [name] and will set pt up for arteriogram to evaluate with possible intervention with Dr. [name]..  Pt is on [place].    Carotid bruit-Left -pt does have a left carotid bruit.  He did have a CTA head/neck by Dr. Estanislado Pandy in August 2021 that revealed a 70% stenosis of the left carotid artery.  Discussed with Dr. [name] and will get duplex of carotids in 5 months.  Discussed with pt.  Tobacco use: -discussed importance of smoking cessation.  Pt states he has been smoking since age 78.  Not much interest in quitting at this time.   -will schedule pt's lower extremity studies after his arteriogram.    Leontine Locket, [facility] MD:   [name]

## 2020-08-04 NOTE — H&P (View-Only) (Signed)
HISTORY AND PHYSICAL     CC:  follow up. Requesting Provider:  Celene Squibb, MD  HPI: This is a 74 y.o. male who is here today for follow up for PAD.  He is s/pright iliofemoral endarterectomy with patch angioplasty as well as common iliac and superficial femoral artery stenting on 5/6/2021by Dr. 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 This was treated with antibiotic beads and a wound VAC. Cultures were negative.He followed up on 05/02/20 with Dr. [name] for wound check and the wound VAC was discontinued and patient was instructed on wet to dry packing changes.   Pt has hx of bilateral EIA stenting 05/04/2014.  He also underwent left femoral to above knee popliteal bypass with GSV with extensive left profunda femoral endarterectomy and left EIA endarterectomy on 08/16/2017.  In May 2019, he underwent atherectomy and drug coated angioplasty of the right SFA and right EIA.  On 03/08/2020, he underwent drug coated balloon angioplasty left CFA and stent of the right SFA.  Dr. [name] is his surgeon.   Pt was last seen 06/06/2020 and at that time, he was doing well without pain or drainage, redness or warmth, fever or chills.  He denied claudication, rest pain or non healing wounds. His wound was healed and no longer needed dressing changes.  He was scheduled for follow up for surveillance and he is here today for that visit.  The pt comes in today for follow up.  He states that he is doing well and denies any claudication, rest pain or non healing wounds.  He states he had dye used a few weeks ago to look at his brain for aneurysm.  CTA head/neck 07/14/2020 by Dr. Estanislado Pandy: IMPRESSION: 1. Severely limited CTA due to bolus timing/quality. The left side was injected and there is left brachiocephalic stenosis from pacer wires. In the future, recommend right-sided injection. 2. Estimated 70% left  proximal ICA stenosis. 3. Estimated 50% right proximal ICA stenosis. 4. Intracranial ICA stent on the right which is likely patent. There appears to be high-grade stenosis at the right M1 segment. 5. Estimated 60% left paraclinoid ICA stenosis. 6. The vertebral arteries are very poorly characterized due to limitations. Subjectively moderate proximal vertebral stenosis due to calcified plaque  He has hx of COPD, ICD, CHF.    The pt is on a statin for cholesterol management.    The pt is on an aspirin.    Other AC:  Brilinta The pt is on BB, ARB for hypertension.  The pt does not have diabetes. Tobacco hx:  current  Pt does not have family hx of AAA.  Past Medical History:  Diagnosis Date  . AICD (automatic cardioverter/defibrillator) present 08/19/2015   [employer] Jude BiV ICD for primary prevention by Dr. Lovena Le  . Alcohol abuse    6 beers per day; hospital admission in 2009 for withdrawal symptoms  . Alcoholic cirrhosis ([place])   . Anxiety and depression    denies   . Cerebrovascular disease 2009   TIA; 2009- right ICA stent; re-intervention for restenosis complicated by [facility] w/o sx  . CHF (congestive heart failure) ([place]) 06-02-14  . Chronic obstructive pulmonary disease ([place])   . Degenerative joint disease    Total shoulder arthroplasty-right  . Hyperlipidemia   . Hypertension   . LBBB (left bundle branch block)    Normal echo-2011; stress nuclear in 09/2010--septal hypoperfusion  representing nontransmural infarction or the effect of left bundle branch block, no ischemia  . Myocardial infarction [facility]) June 02, 2014   Massive Heart Attack  . Peripheral vascular disease ([place])   . 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  . Thrombocytopenia ([place])   . Tobacco abuse    -100 pack years; 1.5 packs per day  . Traumatic seroma of thigh ([place])    left  . Tubular adenoma of colon     Past Surgical History:  Procedure Laterality Date  . ABDOMINAL AORTAGRAM [name] 05/04/2014    Procedure: ABDOMINAL Maxcine Ham;  Surgeon: [name], MD;  Location: [facility] CATH LAB;  Service: Cardiovascular;  Laterality: [name];  . ABDOMINAL AORTOGRAM [name] 06/25/2017   Procedure: Abdominal Aortogram;  Surgeon: [name], MD;  Location: [place] CV LAB;  Service: Cardiovascular;  Laterality: [name];  . ABDOMINAL AORTOGRAM W/LOWER EXTREMITY [name] 04/22/2018   Procedure: ABDOMINAL AORTOGRAM W/LOWER EXTREMITY;  Surgeon: [name], MD;  Location: [place] CV LAB;  Service: Cardiovascular;  Laterality: [name];  . ABDOMINAL AORTOGRAM W/LOWER EXTREMITY [name] 03/08/2020   Procedure: ABDOMINAL AORTOGRAM W/LOWER EXTREMITY;  Surgeon: [name], MD;  Location: [place] CV LAB;  Service: Cardiovascular;  Laterality: [name];  . AGILE CAPSULE [name] 03/18/2014   Procedure: AGILE CAPSULE;  Surgeon: Danie Binder, MD;  [facility];  Service: Endoscopy;  Laterality: [name];  7:30  . APPLICATION OF WOUND VAC Left 09/03/2017   Procedure: APPLICATION OF WOUND VAC;  Surgeon: Conrad Bottineau, MD;  Location: [place];  Service: Vascular;  Laterality: Left;  . APPLICATION OF WOUND VAC Right 04/07/2020   Procedure: PLACEMENT OF ANTIBIOTIC BEADS AND APPLICATION OF WOUND VAC;  Surgeon: [name], MD;  Location: [place];  Service: Vascular;  Laterality: Right;  . BACK SURGERY    . BACTERIAL OVERGROWTH TEST [name] 05/24/2015   Procedure: BACTERIAL OVERGROWTH TEST;  Surgeon: Danie Binder, MD;  [facility];  Service: Endoscopy;  Laterality: [name];  0700  . BI-VENTRICULAR IMPLANTABLE CARDIOVERTER DEFIBRILLATOR  (CRT-D)  08/19/2015  . CARPAL TUNNEL RELEASE Left 02/02/2016   Procedure: LEFT CARPAL TUNNEL RELEASE;  Surgeon: Daryll Brod, MD;  Location: [place];  Service: Orthopedics;  Laterality: Left;  ANESTHESIA: IV REGIONAL UPPER ARM  . CATARACT EXTRACTION W/PHACO Right 03/08/2015   Procedure: CATARACT EXTRACTION PHACO AND INTRAOCULAR LENS PLACEMENT (IOC);  Surgeon: Rutherford Guys, MD;   Location: [facility];  Service: Ophthalmology;  Laterality: Right;  CDE:9.46  . CATARACT EXTRACTION W/PHACO Left 03/22/2015   Procedure: CATARACT EXTRACTION PHACO AND INTRAOCULAR LENS PLACEMENT (IOC);  Surgeon: Rutherford Guys, MD;  [facility]: [facility];  Service: Ophthalmology;  Laterality: Left;  CDE:5.80  . COLONOSCOPY  08/22/09   Fields-(Tubular Adenoma)3-mm transverse polyp/4-mm polyp otherwise noraml/small internal hemorrhoids  . COLONOSCOPY [name] 04/14/2014   WUJ:WJXBJ internal hemorrhids/normal mocsa in the terminal iluem/left colonis redundant  . COLONOSCOPY W/ POLYPECTOMY  2011  . ENDARTERECTOMY FEMORAL Left 08/16/2017   Procedure: ENDARTERECTOMY LEFT PROFUNDA FEMORAL;  Surgeon: [name], MD;  Location: [place];  Service: Vascular;  Laterality: Left;  . ENDARTERECTOMY FEMORAL Right 03/31/2020   Procedure: RIGHT FEMORAL ENDARTERECTOMY;  Surgeon: [name], MD;  Location: [place];  Service: Vascular;  Laterality: Right;  . EP IMPLANTABLE DEVICE [name] 08/19/2015   Procedure: BiV ICD Insertion CRT-D;  Surgeon: Evans Lance, MD;  Location: [place] CV LAB;  Service: Cardiovascular;  Laterality: [name];  . ESOPHAGOGASTRODUODENOSCOPY [name] 03/05/2014  SLF: 1. Stricture at the gastroesophagael junction 2. Small hiatal hernia 3. Moderate non-erosive gastritis and duodentitis. 4. No surce for Melena identified.   . FEMORAL-POPLITEAL BYPASS GRAFT Left 08/16/2017   Procedure: LEFT  FEMORAL-POPLITEAL ARTERY  BYPASS GRAFT;  Surgeon: [name], MD;  Location: [place];  Service: Vascular;  Laterality: Left;  . GIVENS CAPSULE STUDY [name] 03/30/2014   Procedure: GIVENS CAPSULE STUDY;  Surgeon: Danie Binder, MD;  [facility];  Service: Endoscopy;  Laterality: [name];  7:30  . GROIN DEBRIDEMENT Right 04/07/2020   Procedure: INCISION AND DRAINAGE OF RIGHT GROIN;  Surgeon: [name], MD;  [facility];  Service: Vascular;  Laterality: Right;  . I & D EXTREMITY Left 09/03/2017   Procedure:  IRRIGATION AND DEBRIDEMENT EXTREMITY LEFT THIGH SEROMA;  Surgeon: Conrad , MD;  Location: [place];  Service: Vascular;  Laterality: Left;  . INSERT / REPLACE / REMOVE PACEMAKER    . INSERTION OF ILIAC STENT  03/31/2020   Procedure: INSERTION OF RIGHT ILIAC ARTERY STENT AND RIGHT SUPERFICIAL FEMORAL ARTERY STENT;  Surgeon: [name], MD;  [facility];  Service: Vascular;;  . IR ANGIO INTRA EXTRACRAN SEL COM CAROTID INNOMINATE BILAT MOD SED  07/16/2017  . IR ANGIO INTRA EXTRACRAN SEL COM CAROTID INNOMINATE BILAT MOD SED  06/12/2019  . IR ANGIO VERTEBRAL SEL SUBCLAVIAN INNOMINATE BILAT MOD SED  07/16/2017  . IR ANGIO VERTEBRAL SEL SUBCLAVIAN INNOMINATE BILAT MOD SED  06/12/2019  . IR RADIOLOGIST EVAL & MGMT  04/01/2017  . IR RADIOLOGIST EVAL & MGMT  08/02/2017  . IR TRANSCATH EXCRAN VERT OR CAR A STENT  07/22/2019  . IR US GUIDE VASC ACCESS RIGHT  06/12/2019  . JOINT REPLACEMENT Right    Total Shoulder Replacement   . LOWER EXTREMITY ANGIOGRAPHY Bilateral 06/25/2017   Procedure: Lower Extremity Angiography;  Surgeon: [name], MD;  Location: [place] CV LAB;  Service: Cardiovascular;  Laterality: Bilateral;  . LUMBAR FUSION  2010  . PATCH ANGIOPLASTY Right 03/31/2020   Procedure: PATCH ANGIOPLASTY USING XENOSURE BIOLOGIC PATCH 1cm x 14cm;  Surgeon: [name], MD;  Location: [facility] OR;  Service: Vascular;  Laterality: Right;  . PERIPHERAL VASCULAR ATHERECTOMY Right 04/22/2018   Procedure: PERIPHERAL VASCULAR ATHERECTOMY;  Surgeon: [name], MD;  Location: [place] CV LAB;  Service: Cardiovascular;  Laterality: Right;  right superficial femoral  . PERIPHERAL VASCULAR BALLOON ANGIOPLASTY Right 04/22/2018   Procedure: PERIPHERAL VASCULAR BALLOON ANGIOPLASTY;  Surgeon: [name], MD;  Location: [place] CV LAB;  Service: Cardiovascular;  Laterality: Right;  external iliac  . PERIPHERAL VASCULAR BALLOON ANGIOPLASTY Left 03/08/2020   Procedure: PERIPHERAL VASCULAR  BALLOON ANGIOPLASTY;  Surgeon: [name], MD;  Location: [place] CV LAB;  Service: Cardiovascular;  Laterality: Left;  Common Femoral  . PERIPHERAL VASCULAR INTERVENTION Right 03/08/2020   Procedure: PERIPHERAL VASCULAR INTERVENTION;  Surgeon: [name], MD;  Location: [place] CV LAB;  Service: Cardiovascular;  Laterality: Right;  SFA  . RADIOLOGY WITH ANESTHESIA [name] 07/01/2019   Procedure: STENTING;  Surgeon: Luanne Bras, MD;  Location: [place];  Service: Radiology;  Laterality: [name];  . RADIOLOGY WITH ANESTHESIA [name] 07/22/2019   Procedure: STENTING;  Surgeon: Luanne Bras, MD;  Location: [place];  Service: Radiology;  Laterality: [name];  . TOTAL SHOULDER ARTHROPLASTY Right 2011   Dr. Tamera Punt  . ULNAR NERVE TRANSPOSITION Left 02/02/2016   Procedure: LEFT IN-SITU DECOMPRESSION ULNAR NERVE ;  Surgeon: Daryll Brod, MD;  Location: [place];  Service: Orthopedics;  Laterality: Left;  . ULNAR TUNNEL RELEASE Left 02/02/2016   Procedure: LEFT CUBITAL TUNNEL RELEASE;  Surgeon: Daryll Brod, MD;  Location: [place];  Service: Orthopedics;  Laterality: Left;  [name] VASECTOMY  1971  . VEIN HARVEST Left 08/16/2017   Procedure: USING NON REVERSE LEFT GREATER SAPHENOUS VEIN HARVEST;  Surgeon: [name], MD;  Location: [place];  Service: Vascular;  Laterality: Left;    No Known Allergies  Current Outpatient Medications  Medication Sig Dispense Refill  . acetaminophen (TYLENOL) 500 MG tablet Take 1,000 mg by mouth as needed for moderate pain or headache.     [name] aspirin EC 81 MG tablet Take 81 mg by mouth every evening.     [name] atorvastatin (LIPITOR) 20 MG tablet Take 20 mg by mouth daily.     [name] BRILINTA 90 MG TABS tablet Take 90 mg by mouth 2 (two) times daily.    . carvedilol (COREG) 3.125 MG tablet TAKE 1 TABLET TWICE A DAY 180 tablet 0  . dextromethorphan-guaiFENesin (MUCINEX DM) 30-600 MG 12hr tablet Take 1 tablet by mouth daily.     [name] ENTRESTO 49-51  MG TAKE 1 TABLET TWICE A DAY 180 tablet 4  . ferrous sulfate 324 MG TBEC Take 65 mg by mouth 3 (three) times a week. Take Monday,Wednesday and Friday    . gabapentin (NEURONTIN) 300 MG capsule Take 300 mg by mouth 2 (two) times daily.     . magnesium oxide (MAG-OX) 400 MG tablet Take 400 mg by mouth daily.    . Multiple Vitamins-Minerals (CENTRUM SILVER PO) Take 1 tablet by mouth daily. 50+    . omega-3 acid ethyl esters (LOVAZA) 1 g capsule Take 1 g by mouth daily.     [name] oxyCODONE-acetaminophen (PERCOCET/ROXICET) 5-325 MG tablet Take 1 tablet by mouth daily as needed.    . SUPER B COMPLEX/C PO Take 1 tablet by mouth at bedtime.     . tamsulosin (FLOMAX) 0.4 MG CAPS capsule Take 0.4 mg by mouth 2 (two) times daily.     . traMADol (ULTRAM) 50 MG tablet Take 50 mg by mouth as needed.     No current facility-administered medications for this visit.    Family History  Problem Relation Age of Onset  . Coronary artery disease Mother   . Diabetes Mother   . Heart disease Mother        Before age 64 - 31 Bypasses  . Hypertension Mother   . Heart attack Mother        3-4 Heart attacks  . Alzheimer's disease Father   . Diabetes Father   . Heart attack Sister   . Cancer Brother        Prostate  . Hyperlipidemia Son   . Prostate cancer Brother   . Alzheimer's disease Sister   . Colon cancer Neg Hx     Social History   Socioeconomic History  . Marital status: Married    Spouse name: Not on file  . Number of children: 2  . Years of education: Not on file  . Highest education level: Not on file  Occupational History  . Occupation: retired, Clinical biochemist    Comment: Programmer, systems: RETIRED  Tobacco Use  . Smoking status: Current Every Day Smoker    Packs/day: 1.00    Years: 60.00    Pack years: 60.00    Types:  Cigarettes    Start date: 08/20/1957  . Smokeless tobacco: Never Used  Vaping Use  . Vaping Use: Former  . Start date: 09/10/2016  . Quit date: 10/11/2016    Substance and Sexual Activity  . Alcohol use: No    Alcohol/week: 0.0 standard drinks    Comment: quit in July 2015  . Drug use: No  . Sexual activity: Not Currently  Other Topics Concern  . Not on file  Social History Narrative   Accompanied by daughter [name]   Lives w/ wife, daughter   Social Determinants of Health   Financial Resource Strain:   . Difficulty of Paying Living Expenses: Not on file  Food Insecurity:   . Worried About Charity fundraiser in the Last Year: Not on file  . Ran Out of Food in the Last Year: Not on file  Transportation Needs:   . Lack of Transportation (Medical): Not on file  . Lack of Transportation (Non-Medical): Not on file  Physical Activity:   . Days of Exercise per Week: Not on file  . Minutes of Exercise per Session: Not on file  Stress:   . Feeling of Stress : Not on file  Social Connections:   . Frequency of Communication with Friends and Family: Not on file  . Frequency of Social Gatherings with Friends and Family: Not on file  . Attends Religious Services: Not on file  . Active Member of Clubs or Organizations: Not on file  . Attends Archivist Meetings: Not on file  . Marital Status: Not on file  Intimate Partner Violence:   . Fear of Current or Ex-Partner: Not on file  . Emotionally Abused: Not on file  . Physically Abused: Not on file  . Sexually Abused: Not on file     REVIEW OF SYSTEMS:   [X]  denotes positive finding, [ ]  denotes negative finding Cardiac  Comments:  Chest pain or chest pressure:    Shortness of breath upon exertion:    Short of breath when lying flat:    Irregular heart rhythm:        Vascular    Pain in calf, thigh, or hip brought on by ambulation:    Pain in feet at night that wakes you up from your sleep:     Blood clot in your veins:    Leg swelling:         Pulmonary    COPD x       Neurologic    Sudden weakness in arms or legs:     Sudden numbness in arms or legs:      Sudden onset of difficulty speaking or slurred speech:    Temporary loss of vision in one eye:     Problems with dizziness:         Gastrointestinal    Blood in stool:     Vomited blood:         Genitourinary    Burning when urinating:     Blood in urine:        Psychiatric    Major depression:         Hematologic    Bleeding problems:    Problems with blood clotting too easily:        Skin    Rashes or ulcers:        Constitutional    Fever or chills:      PHYSICAL EXAMINATION:  Today's Vitals   08/08/20 0923  BP: 133/73  Pulse: 65  Resp: 20  Temp: 98.2 F (36.8 C)  TempSrc: Temporal  SpO2: 99%  Weight: 140 lb 3.2 oz (63.6 kg)  Height: 5\' 3"  (1.6 m)   Body mass index is 24.84 kg/m.   General:  WDWN in NAD; vital signs documented above Gait: Not observed HENT: WNL, normocephalic Pulmonary: normal non-labored breathing , without wheezing Cardiac: regular HR, without  Murmur; with left carotid bruit Abdomen: soft, NT, no masses Skin: without rashes; well healed incisions right groin and left groin and leg Vascular Exam/Pulses:  Right Left  Radial 2+ (normal) 2+ (normal)  Ulnar Unable to palpate Unable to palpate  Femoral 2+ (normal) 2+ (normal)  Popliteal Unable to palpate Unable to palpate  DP 2+ (normal) - AT 2+ (normal)  PT Unable to palpate Unable to palpate   Extremities: without ischemic changes, without Gangrene , without cellulitis; without open wounds;  Musculoskeletal: no muscle wasting or atrophy  Neurologic: A&O X 3;  No focal weakness or paresthesias are detected Psychiatric:  The pt has Normal affect.   Non-Invasive Vascular Imaging:   ABI's/TBI's on 08/08/2020: Right:  0.89/0.62 (B, T) - Great toe pressure: 77 Left:  0.87/0.50 (M, B)  - Great toe pressure: 62  Arterial duplex on 08/08/2020: +----------+--------+-----+--------+--------+--------+  RIGHT   PSV cm/sRatioStenosisWaveformComments   +----------+--------+-----+--------+--------+--------+  CIA Prox 141          biphasic      +----------+--------+-----+--------+--------+--------+  CIA Mid  183          biphasic      +----------+--------+-----+--------+--------+--------+  CIA Distal163          biphasic      +----------+--------+-----+--------+--------+--------+    Right Stent(s):  +---------------+--------+--------+--------+--------+  Prox SFA    PSV cm/sStenosisWaveformComments  +---------------+--------+--------+--------+--------+  Prox to Stent 97       biphasic      +---------------+--------+--------+--------+--------+  Proximal Stent 132       biphasic      +---------------+--------+--------+--------+--------+  Mid Stent   59       biphasic      +---------------+--------+--------+--------+--------+  Distal Stent  54       biphasic      +---------------+--------+--------+--------+--------+  Distal to Stent110       biphasic      +---------------+--------+--------+--------+--------+    +----------+--------+-----+---------------+--------+--------+  LEFT   PSV cm/sRatioStenosis    WaveformComments  +----------+--------+-----+---------------+--------+--------+  CIA Prox 39              biphasic      +----------+--------+-----+---------------+--------+--------+  CIA Mid  479      75-99% stenosisbiphasic      +----------+--------+-----+---------------+--------+--------+  CIA Distal43              biphasic      +----------+--------+-----+---------------+--------+--------+     Left Graft #1: +--------------------+--------+--------+--------+--------+            PSV cm/sStenosisWaveformComments  +--------------------+--------+--------+--------+--------+  Inflow        19       biphasic      +--------------------+--------+--------+--------+--------+  Proximal Anastomosis42       biphasic      +--------------------+--------+--------+--------+--------+  Proximal Graft   58       biphasic      +--------------------+--------+--------+--------+--------+  Mid Graft      44       biphasic      +--------------------+--------+--------+--------+--------+  Distal Graft    40       biphasic      +--------------------+--------+--------+--------+--------+  Distal Anastomosis 50       biphasic      +--------------------+--------+--------+--------+--------+  Outflow       88       biphasic      +--------------------+--------+--------+--------+--------+  Summary:  Right: Patent right SFA stent with no evidence of restenosis.   Left: 75-99% stenosis noted in the common femoral artery. >75% Left CFA stenosis. Patent left fem-pop bypass graft with no evidence of stenosis.   Abdominal Aorta Findings:  +-----------+-------+----------+----------+--------+--------+--------+  Location  AP (cm)Trans (cm)PSV (cm/s)WaveformThrombusComments  +-----------+-------+----------+----------+--------+--------+--------+  Proximal  1.69  1.77   79                  +-----------+-------+----------+----------+--------+--------+--------+  Distal   1.64  1.54   56                  +-----------+-------+----------+----------+--------+--------+--------+  LT CIA Prox72.3            biphasic          +-----------+-------+----------+----------+--------+--------+--------+      Right Stent(s):  +---------------+--------+---------------+----------+--------+  Common iliac  PSV cm/sStenosis    Waveform Comments   +---------------+--------+---------------+----------+--------+  Prox to Stent 95           monophasic      +---------------+--------+---------------+----------+--------+  Proximal Stent 79           biphasic       +---------------+--------+---------------+----------+--------+  Mid Stent   80           biphasic       +---------------+--------+---------------+----------+--------+  Distal Stent  308   50-99% stenosismonophasic      +---------------+--------+---------------+----------+--------+  Distal to Stent297           monophasic      +---------------+--------+---------------+----------+--------+   >50% Distal common iliac artery stent stenosis.    Left Stent(s):  +---------------+--------+--------+--------+--------+  External iliac PSV cm/sStenosisWaveformComments  +---------------+--------+--------+--------+--------+  Prox to Stent 84       biphasic      +---------------+--------+--------+--------+--------+  Proximal Stent 121       biphasic      +---------------+--------+--------+--------+--------+  Mid Stent   97       biphasic      +---------------+--------+--------+--------+--------+  Distal Stent  109       biphasic      +---------------+--------+--------+--------+--------+  Distal to Stent120       biphasic      +---------------+--------+--------+--------+--------+   Proximal branch through stent (Internal iliac artery?) 300 cm/sec peak  systolic velocity.   Previous ABI's/TBI's on 03/03/2020: Right:  0.57/0.49 (M) - Great toe pressure: 76 Left:  0.88/0.74 (T) - Great toe pressure:  114  Previous arterial duplex on 03/03/2020: Summary:  Right: 50-74% stenosis noted in the common femoral artery. 50-74% stenosis noted in the deep femoral artery. 50-74% stenosis noted in the superficial  femoral artery. Unable to duplicate elevated velocities in the 75-99% stenosis range as noted on prior exam. Elevated velocities may be obscured by calcified plaque.   Left: Patent left femoropoliteal bypass graft without evidence of ingraft  stenosis.  The inflow artery demonstrates elevated velocities indicating a stenosis  of 50-74%.  Aortogram 03/08/2020: Findings:                Aortogram: No significant renal artery stenosis.  The infrarenal abdominal aorta is calcified without significant stenosis.  There is a 50% stenosis at the origin of the right common iliac artery.  The stent within the right external iliac artery is patent throughout its course.  The left common iliac artery is calcified but without significant stenosis.  The left external iliac artery and stent are patent throughout their course.               Right Lower Extremity: The right common femoral artery is heavily calcified with approximate 50% stenosis.  The profunda origin is occluded.  The superficial femoral artery is patent with tandem greater than 80% stenoses.  The popliteal artery is patent throughout its course with three-vessel runoff.               Left Lower Extremity: The common femoral artery at the level of inguinal ligament shows approximately a 70% stenosis.  There is patulous dilatation of the left common femoral artery consistent with prior endarterectomy.  The profundofemoral arteries widely patent.  Bypass graft is visualized with anastomosis to the above-knee popliteal artery which is widely patent.  Peroneal artery is the dominant runoff.    ASSESSMENT/PLAN:: 74 y.o. male here for follow up for PAD with hx of right iliofemoral endarterectomy with patch angioplasty as well as common iliac and superficial femoral artery stenting on 5/6/2021by Dr. 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  Also has hx of  stenting  And drug coated balloon angioplasty of right SFA on 03/08/2020 by Dr. [name] as well as hx of left femoral to popliteal bypass grafting with GSV  PAD -pt with palpable right AT pulse and left DP pulse, however, pt has elevated velocities in the left CFA suggestive of 75-99% stenosis.  He has has elevated velocities in the right distal CIA stent.  Discussed findings with Dr. [name] and will set pt up for arteriogram to evaluate with possible intervention with Dr. [name]..  Pt is on Peridot.    Carotid bruit-Left -pt does have a left carotid bruit.  He did have a CTA head/neck by Dr. Estanislado Pandy in August 2021 that revealed a 70% stenosis of the left carotid artery.  Discussed with Dr. [name] and will get duplex of carotids in 5 months.  Discussed with pt.  Tobacco use: -discussed importance of smoking cessation.  Pt states he has been smoking since age 75.  Not much interest in quitting at this time.   -will schedule pt's lower extremity studies after his arteriogram.    Leontine Locket, [facility] MD:   [name]

## 2020-08-08 ENCOUNTER — Ambulatory Visit (INDEPENDENT_AMBULATORY_CARE_PROVIDER_SITE_OTHER)
Admission: RE | Admit: 2020-08-08 | Discharge: 2020-08-08 | Disposition: A | Discharge status: Home / Self Care | Payer: Medicare Other | Source: Ambulatory Visit | Attending: Surgery | Admitting: Surgery

## 2020-08-08 ENCOUNTER — Other Ambulatory Visit: Payer: Self-pay

## 2020-08-08 ENCOUNTER — Ambulatory Visit (INDEPENDENT_AMBULATORY_CARE_PROVIDER_SITE_OTHER): Payer: Medicare Other | Admitting: Physician Assistant

## 2020-08-08 ENCOUNTER — Ambulatory Visit (HOSPITAL_COMMUNITY)
Admission: RE | Admit: 2020-08-08 | Discharge: 2020-08-08 | Disposition: A | Discharge status: Home / Self Care | Payer: Medicare Other | Source: Ambulatory Visit | Attending: Surgery | Admitting: Surgery

## 2020-08-08 VITALS — BP 133/73 | HR 65 | Temp 98.2°F | Resp 20 | Ht 63.0 in | Wt 140.2 lb

## 2020-08-08 DIAGNOSIS — F17200 Nicotine dependence, unspecified, uncomplicated: Secondary | ICD-10-CM | POA: Diagnosis not present

## 2020-08-08 DIAGNOSIS — I739 Peripheral vascular disease, unspecified: Secondary | ICD-10-CM

## 2020-08-08 DIAGNOSIS — R0989 Other specified symptoms and signs involving the circulatory and respiratory systems: Secondary | ICD-10-CM

## 2020-08-08 DIAGNOSIS — I779 Disorder of arteries and arterioles, unspecified: Secondary | ICD-10-CM

## 2020-08-08 DIAGNOSIS — I70213 Atherosclerosis of native arteries of extremities with intermittent claudication, bilateral legs: Secondary | ICD-10-CM

## 2020-08-10 ENCOUNTER — Other Ambulatory Visit: Payer: Self-pay | Admitting: *Deleted

## 2020-08-10 DIAGNOSIS — R0989 Other specified symptoms and signs involving the circulatory and respiratory systems: Secondary | ICD-10-CM

## 2020-08-19 ENCOUNTER — Other Ambulatory Visit: Payer: Self-pay

## 2020-08-19 ENCOUNTER — Other Ambulatory Visit
Admission: RE | Admit: 2020-08-19 | Discharge: 2020-08-19 | Disposition: A | Discharge status: Home / Self Care | Payer: Medicare Other | Source: Ambulatory Visit | Attending: Surgery | Admitting: Surgery

## 2020-08-19 DIAGNOSIS — Z20822 Contact with and (suspected) exposure to covid-19: Secondary | ICD-10-CM | POA: Insufficient documentation

## 2020-08-19 DIAGNOSIS — Z01812 Encounter for preprocedural laboratory examination: Secondary | ICD-10-CM | POA: Insufficient documentation

## 2020-08-19 LAB — SARS CORONAVIRUS 2 (TAT 6-24 HRS): SARS Coronavirus 2: NEGATIVE

## 2020-08-23 ENCOUNTER — Ambulatory Visit (HOSPITAL_COMMUNITY)
Admission: RE | Admit: 2020-08-23 | Discharge: 2020-08-23 | Disposition: A | Discharge status: Home / Self Care | Payer: Medicare Other | Attending: Surgery | Admitting: Surgery

## 2020-08-23 ENCOUNTER — Encounter (HOSPITAL_COMMUNITY)
Admission: RE | Disposition: A | Discharge status: Home / Self Care | Payer: Self-pay | Source: Home / Self Care | Attending: Surgery

## 2020-08-23 ENCOUNTER — Other Ambulatory Visit: Payer: Self-pay

## 2020-08-23 DIAGNOSIS — Z95810 Presence of automatic (implantable) cardiac defibrillator: Secondary | ICD-10-CM | POA: Insufficient documentation

## 2020-08-23 DIAGNOSIS — Z79899 Other long term (current) drug therapy: Secondary | ICD-10-CM | POA: Insufficient documentation

## 2020-08-23 DIAGNOSIS — T82856A Stenosis of peripheral vascular stent, initial encounter: Secondary | ICD-10-CM | POA: Diagnosis not present

## 2020-08-23 DIAGNOSIS — I252 Old myocardial infarction: Secondary | ICD-10-CM | POA: Diagnosis not present

## 2020-08-23 DIAGNOSIS — Z8673 Personal history of transient ischemic attack (TIA), and cerebral infarction without residual deficits: Secondary | ICD-10-CM | POA: Insufficient documentation

## 2020-08-23 DIAGNOSIS — T82858A Stenosis of vascular prosthetic devices, implants and grafts, initial encounter: Secondary | ICD-10-CM | POA: Insufficient documentation

## 2020-08-23 DIAGNOSIS — I70213 Atherosclerosis of native arteries of extremities with intermittent claudication, bilateral legs: Secondary | ICD-10-CM | POA: Diagnosis not present

## 2020-08-23 DIAGNOSIS — F17210 Nicotine dependence, cigarettes, uncomplicated: Secondary | ICD-10-CM | POA: Insufficient documentation

## 2020-08-23 DIAGNOSIS — I110 Hypertensive heart disease with heart failure: Secondary | ICD-10-CM | POA: Diagnosis not present

## 2020-08-23 DIAGNOSIS — I447 Left bundle-branch block, unspecified: Secondary | ICD-10-CM | POA: Insufficient documentation

## 2020-08-23 DIAGNOSIS — J449 Chronic obstructive pulmonary disease, unspecified: Secondary | ICD-10-CM | POA: Insufficient documentation

## 2020-08-23 DIAGNOSIS — I509 Heart failure, unspecified: Secondary | ICD-10-CM | POA: Insufficient documentation

## 2020-08-23 DIAGNOSIS — Y832 Surgical operation with anastomosis, bypass or graft as the cause of abnormal reaction of the patient, or of later complication, without mention of misadventure at the time of the procedure: Secondary | ICD-10-CM | POA: Diagnosis not present

## 2020-08-23 DIAGNOSIS — E785 Hyperlipidemia, unspecified: Secondary | ICD-10-CM | POA: Insufficient documentation

## 2020-08-23 DIAGNOSIS — Z7982 Long term (current) use of aspirin: Secondary | ICD-10-CM | POA: Insufficient documentation

## 2020-08-23 DIAGNOSIS — E7800 Pure hypercholesterolemia, unspecified: Secondary | ICD-10-CM | POA: Insufficient documentation

## 2020-08-23 HISTORY — PX: ABDOMINAL AORTOGRAM W/LOWER EXTREMITY: CATH118223

## 2020-08-23 HISTORY — PX: PERIPHERAL VASCULAR BALLOON ANGIOPLASTY: CATH118281

## 2020-08-23 HISTORY — PX: PERIPHERAL VASCULAR INTERVENTION: CATH118257

## 2020-08-23 LAB — POCT I-STAT, CHEM 8
BUN: 16 mg/dL (ref 8–23)
Calcium, Ion: 1.19 mmol/L (ref 1.15–1.40)
Chloride: 102 mmol/L (ref 98–111)
Creatinine, Ser: 0.6 mg/dL — ABNORMAL LOW (ref 0.61–1.24)
Glucose, Bld: 95 mg/dL (ref 70–99)
HCT: 39 % (ref 39.0–52.0)
Hemoglobin: 13.3 g/dL (ref 13.0–17.0)
Potassium: 4.3 mmol/L (ref 3.5–5.1)
Sodium: 137 mmol/L (ref 135–145)
TCO2: 24 mmol/L (ref 22–32)

## 2020-08-23 LAB — POCT ACTIVATED CLOTTING TIME
Activated Clotting Time: 290 seconds
Activated Clotting Time: 230 seconds
Activated Clotting Time: 169 seconds
Activated Clotting Time: 191 seconds

## 2020-08-23 SURGERY — ABDOMINAL AORTOGRAM W/LOWER EXTREMITY
Anesthesia: LOCAL | Laterality: Right

## 2020-08-23 MED ORDER — FENTANYL CITRATE (PF) 100 MCG/2ML IJ SOLN
INTRAMUSCULAR | Status: DC | PRN
Start: 2020-08-23 — End: 2020-08-23
  Administered 2020-08-23: 50 ug via INTRAVENOUS

## 2020-08-23 MED ORDER — SODIUM CHLORIDE 0.9% FLUSH: 3.0000 mL | Freq: Two times a day (BID) | INTRAVENOUS | Status: DC

## 2020-08-23 MED ORDER — SODIUM CHLORIDE 0.9 % WEIGHT BASED INFUSION: 1.0000 mL/kg/h | INTRAVENOUS | Status: DC

## 2020-08-23 MED ORDER — HEPARIN SODIUM (PORCINE) 1000 UNIT/ML IJ SOLN
INTRAMUSCULAR | Status: DC | PRN
  Administered 2020-08-23: 7000 [IU] via INTRAVENOUS

## 2020-08-23 MED ORDER — HEPARIN (PORCINE) IN NACL 1000-0.9 UT/500ML-% IV SOLN
INTRAVENOUS | Status: AC
  Filled 2020-08-23: qty 1000

## 2020-08-23 MED ORDER — OXYCODONE HCL 5 MG PO TABS: 5.0000 mg | ORAL_TABLET | ORAL | Status: DC | PRN

## 2020-08-23 MED ORDER — ACETAMINOPHEN 325 MG PO TABS
ORAL_TABLET | ORAL | Status: AC
  Filled 2020-08-23: qty 2

## 2020-08-23 MED ORDER — LIDOCAINE HCL (PF) 1 % IJ SOLN
INTRAMUSCULAR | Status: AC
  Filled 2020-08-23: qty 30

## 2020-08-23 MED ORDER — IODIXANOL 320 MG/ML IV SOLN
INTRAVENOUS | Status: DC | PRN
  Administered 2020-08-23: 170 mL via INTRA_ARTERIAL

## 2020-08-23 MED ORDER — MIDAZOLAM HCL 2 MG/2ML IJ SOLN
INTRAMUSCULAR | Status: DC | PRN
  Administered 2020-08-23: 2 mg via INTRAVENOUS

## 2020-08-23 MED ORDER — HYDRALAZINE HCL 20 MG/ML IJ SOLN: 5.0000 mg | INTRAMUSCULAR | Status: DC | PRN

## 2020-08-23 MED ORDER — LIDOCAINE HCL (PF) 1 % IJ SOLN
INTRAMUSCULAR | Status: DC | PRN
  Administered 2020-08-23: 15 mL via INTRADERMAL

## 2020-08-23 MED ORDER — MIDAZOLAM HCL 2 MG/2ML IJ SOLN
INTRAMUSCULAR | Status: AC
  Filled 2020-08-23: qty 2

## 2020-08-23 MED ORDER — HEPARIN SODIUM (PORCINE) 1000 UNIT/ML IJ SOLN
INTRAMUSCULAR | Status: AC
  Filled 2020-08-23: qty 1

## 2020-08-23 MED ORDER — FENTANYL CITRATE (PF) 100 MCG/2ML IJ SOLN
INTRAMUSCULAR | Status: AC
  Filled 2020-08-23: qty 2

## 2020-08-23 MED ORDER — ONDANSETRON HCL 4 MG/2ML IJ SOLN: 4.0000 mg | Freq: Four times a day (QID) | INTRAMUSCULAR | Status: DC | PRN

## 2020-08-23 MED ORDER — SODIUM CHLORIDE 0.9 % IV SOLN: 250.0000 mL | INTRAVENOUS | Status: DC | PRN

## 2020-08-23 MED ORDER — SODIUM CHLORIDE 0.9 % IV SOLN: INTRAVENOUS | Status: DC

## 2020-08-23 MED ORDER — ACETAMINOPHEN 325 MG PO TABS
650.0000 mg | ORAL_TABLET | ORAL | Status: DC | PRN
  Administered 2020-08-23: 650 mg via ORAL

## 2020-08-23 MED ORDER — SODIUM CHLORIDE 0.9% FLUSH: 3.0000 mL | INTRAVENOUS | Status: DC | PRN

## 2020-08-23 MED ORDER — HEPARIN (PORCINE) IN NACL 1000-0.9 UT/500ML-% IV SOLN
INTRAVENOUS | Status: DC | PRN
  Administered 2020-08-23 (×2): 500 mL

## 2020-08-23 MED ORDER — MORPHINE SULFATE (PF) 2 MG/ML IV SOLN: 2.0000 mg | INTRAVENOUS | Status: DC | PRN

## 2020-08-23 MED ORDER — LABETALOL HCL 5 MG/ML IV SOLN: 10.0000 mg | INTRAVENOUS | Status: DC | PRN

## 2020-08-23 SURGICAL SUPPLY — 20 items
BALLN STERLING OTW 6X60X135 (BALLOONS) ×4
BALLOON STERLING OTW 6X60X135 (BALLOONS) IMPLANT
CATH OMNI FLUSH 5F 65CM (CATHETERS) ×1 IMPLANT
DCB RANGER 5.0X40 135 (BALLOONS) IMPLANT
DEVICE CONTINUOUS FLUSH (MISCELLANEOUS) ×1 IMPLANT
GUIDEWIRE ANGLED .035X150CM (WIRE) ×1 IMPLANT
KIT ENCORE 26 ADVANTAGE (KITS) ×1 IMPLANT
KIT MICROPUNCTURE NIT STIFF (SHEATH) ×1 IMPLANT
KIT PV (KITS) ×4 IMPLANT
RANGER DCB 5.0X40 135 (BALLOONS) ×4
SHEATH PINNACLE 5F 10CM (SHEATH) ×1 IMPLANT
SHEATH PINNACLE 6F 10CM (SHEATH) ×2 IMPLANT
SHEATH PINNACLE MP 6F 45CM (SHEATH) ×1 IMPLANT
SHEATH PROBE COVER 6X72 (BAG) ×1 IMPLANT
STENT ELUVIA 7X40X130 (Permanent Stent) ×1 IMPLANT
SYR MEDRAD MARK V 150ML (SYRINGE) ×1 IMPLANT
TRANSDUCER W/STOPCOCK (MISCELLANEOUS) ×4 IMPLANT
TRAY PV CATH (CUSTOM PROCEDURE TRAY) ×4 IMPLANT
WIRE G V18X300CM (WIRE) ×1 IMPLANT
WIRE STARTER BENTSON 035X150 (WIRE) ×1 IMPLANT

## 2020-08-23 NOTE — Discharge Instructions (Signed)
Femoral Site Care This sheet gives you information about how to care for yourself after your procedure. Your health care provider may also give you more specific instructions. If you have problems or questions, contact your health care provider. What can I expect after the procedure? After the procedure, it is common to have:  Bruising that usually fades within 1-2 weeks.  Tenderness at the site. Follow these instructions at home: Wound care  Follow instructions from your health care provider about how to take care of your insertion site. Make sure you: ? Wash your hands with soap and water before you change your bandage (dressing). If soap and water are not available, use hand sanitizer. ? Change your dressing as told by your health care provider. ? Leave stitches (sutures), skin glue, or adhesive strips in place. These skin closures may need to stay in place for 2 weeks or longer. If adhesive strip edges start to loosen and curl up, you may trim the loose edges. Do not remove adhesive strips completely unless your health care provider tells you to do that.  Do not take baths, swim, or use a hot tub until your health care provider approves.  You may shower 24-48 hours after the procedure or as told by your health care provider. ? Gently wash the site with plain soap and water. ? Pat the area dry with a clean towel. ? Do not rub the site. This may cause bleeding.  Do not apply powder or lotion to the site. Keep the site clean and dry.  Check your femoral site every day for signs of infection. Check for: ? Redness, swelling, or pain. ? Fluid or blood. ? Warmth. ? Pus or a bad smell. Activity  For the first 2-3 days after your procedure, or as long as directed: ? Avoid climbing stairs as much as possible. ? Do not squat.  Do not lift anything that is heavier than 10 lb (4.5 kg), or the limit that you are told, until your health care provider says that it is safe.  Rest as  directed. ? Avoid sitting for a long time without moving. Get up to take short walks every 1-2 hours.  Do not drive for 24 hours if you were given a medicine to help you relax (sedative). General instructions  Take over-the-counter and prescription medicines only as told by your health care provider.  Keep all follow-up visits as told by your health care provider. This is important. Contact a health care provider if you have:  A fever or chills.  You have redness, swelling, or pain around your insertion site. Get help right away if:  The catheter insertion area swells very fast.  You pass out.  You suddenly start to sweat or your skin gets clammy.  The catheter insertion area is bleeding, and the bleeding does not stop when you hold steady pressure on the area.  The area near or just beyond the catheter insertion site becomes pale, cool, tingly, or numb. These symptoms may represent a serious problem that is an emergency. Do not wait to see if the symptoms will go away. Get medical help right away. Call your local emergency services (911 in the [place] Do not drive yourself to the hospital. Summary  After the procedure, it is common to have bruising that usually fades within 1-2 weeks.  Check your femoral site every day for signs of infection.  Do not lift anything that is heavier than 10 lb (4.5 kg), or the   limit that you are told, until your health care provider says that it is safe. This information is not intended to replace advice given to you by your health care provider. Make sure you discuss any questions you have with your health care provider. Document Revised: 11/25/2017 Document Reviewed: 11/25/2017 Elsevier Patient Education  2020 Elsevier Inc[employer]  

## 2020-08-23 NOTE — Progress Notes (Signed)
Site area: right groin  Site Prior to Removal:  Level 0  Pressure Applied For 30 MINUTES    Minutes Beginning at 1029  Manual:   Yes.    Patient Status During Pull:  Stable  Post Pull Groin Site:  Level  0  0-3:[id number]}  Post Pull Instructions Given:  Yes.    Post Pull Pulses Present:  Yes.    Dressing Applied:  Yes.    Comments:  Bed rest started at 1200 X 4 hr.

## 2020-08-23 NOTE — Op Note (Signed)
Patient name: Roy [name] MRN: [id number] DOB: Oct 30, 1946 Sex: male  08/23/2020 Pre-operative Diagnosis: In-stent stenosis Post-operative diagnosis:  Same Surgeon:  Annamarie Major Procedure Performed:  1.  Ultrasound-guided access, right femoral artery  2.  Abdominal aortogram  3.  Bilateral lower extremity runoff  4.  Drug-coated balloon angioplasty, left common femoral artery  5.  Stent, right external iliac artery  6.  Conscious sedation, 54 minutes    Indications: The patient has undergone multiple procedures for revascularization.  His most recent ultrasound identified a stenosis within the left common femoral artery and distal right external iliac artery.  He comes in today for evaluation and intervention.  Procedure:  The patient was identified in the holding area and taken to room 8.  The patient was then placed supine on the table and prepped and draped in the usual sterile fashion.  A time out was called.  Conscious sedation was administered with the use of IV fentanyl and Versed under continuous physician and nurse monitoring.  Heart rate, blood pressure, and oxygen saturation were continuously monitored.  Total sedation time was 54 minutes.  Ultrasound was used to evaluate the right common femoral artery.  It was patent .  A digital ultrasound image was acquired.  A micropuncture needle was used to access the right common femoral artery under ultrasound guidance.  An 018 wire was advanced without resistance and a micropuncture sheath was placed.  The 018 wire was removed and a benson wire was placed.  The micropuncture sheath was exchanged for a 5 french sheath.  An omniflush catheter was advanced over the wire to the level of L-1.  An abdominal angiogram was obtained.  Next, pelvic oblique images were obtained followed by bilateral runoff. Findings:   Aortogram: No significant renal artery stenosis.  The infrarenal abdominal aorta is calcified but patent without stenosis.  The  left common iliac artery and its associated stent are widely patent.  The left external iliac artery is small in caliber but does not show any significant stenosis.  The right common iliac artery is widely patent.  There is a high-grade lesion at the origin of the right hypogastric artery.  The stent within the right common iliac artery and the right external iliac artery are widely patent.  Just distal to the stent on the right external iliac artery, there is a 70% stenosis.  Right Lower Extremity: The right common femoral artery is patent however there is approximately 50% stenosis in its midportion.  The profundofemoral artery is patent with a 50% stenosis just beyond its origin.  The stents within the right superficial femoral artery are widely patent as is the popliteal artery with single-vessel runoff via the posterior tibial artery  Left Lower Extremity: The left common femoral artery is patent with a approximately 50% stenosis at the origin of the patch.  The profundofemoral artery is widely patent.  There is a bypass graft from the common femoral artery to the above-knee popliteal artery which is widely patent.  There is single-vessel runoff via the peroneal artery  Intervention: After the above images were acquired the decision was made to proceed with intervention.  A 6 French 45 cm sheath was advanced into the left external iliac artery.  A V-18 wire was advanced across the lesion in the common femoral artery.  The patient was fully heparinized.  I selected a 5 x 40 drug-coated Ranger balloon and perform balloon angioplasty of the common femoral artery for 3 minutes.  Completion imaging showed no residual stenosis.  The sheath was then withdrawn to the right common femoral artery I stented the distal right external iliac artery using a 7 x 40 Elluvia stent.  This was postdilated with a 6 mm balloon.  I also used this balloon to dilate the external iliac stent proximally.  The long sheath was  exchanged out for a short 6 [place] sheath.  The patient was taken over here for sheath pull once his coagulation profile correct.  Impression:  #1  70% distal right external iliac artery stenosis treated using a 7 x 60 Elluvia stent with no residual stenosis.  #2  Approximate 50% stenosis in the proximal left common femoral artery which correlated with the ultrasound findings.  This was treated using a 5 x 40 drug-coated Ranger balloon  #3  Approximate 50% right common femoral artery stenosis and right profundofemoral stenosis.  #4  Widely patent left femoral-popliteal bypass graft  #5  Widely patent right superficial femoral artery stents.   Theotis Burrow, M.D., [facility] [place] Office: [phone] Pager:  [phone]

## 2020-08-23 NOTE — Interval H&P Note (Signed)
History and Physical Interval Note:  08/23/2020 6:25 AM  Roy [name]  has presented today for surgery, with the diagnosis of PAD.  The various methods of treatment have been discussed with the patient and family. After consideration of risks, benefits and other options for treatment, the patient has consented to  Procedure(s): ABDOMINAL AORTOGRAM W/LOWER EXTREMITY (Bilateral) as a surgical intervention.  The patient's history has been reviewed, patient examined, no change in status, stable for surgery.  I have reviewed the patient's chart and labs.  Questions were answered to the patient's satisfaction.     Annamarie Major

## 2020-08-23 NOTE — Progress Notes (Signed)
Patient was given discharge instructions. He verbalized understanding. 

## 2020-08-24 ENCOUNTER — Encounter (HOSPITAL_COMMUNITY): Payer: Self-pay | Admitting: Surgery

## 2020-09-05 ENCOUNTER — Encounter (HOSPITAL_COMMUNITY): Payer: Medicare Other

## 2020-09-06 ENCOUNTER — Other Ambulatory Visit: Payer: Self-pay | Admitting: *Deleted

## 2020-09-06 DIAGNOSIS — I70213 Atherosclerosis of native arteries of extremities with intermittent claudication, bilateral legs: Secondary | ICD-10-CM

## 2020-09-06 DIAGNOSIS — I739 Peripheral vascular disease, unspecified: Secondary | ICD-10-CM

## 2020-09-26 ENCOUNTER — Ambulatory Visit (INDEPENDENT_AMBULATORY_CARE_PROVIDER_SITE_OTHER): Payer: Medicare Other | Admitting: Physician Assistant

## 2020-09-26 ENCOUNTER — Other Ambulatory Visit: Payer: Self-pay

## 2020-09-26 ENCOUNTER — Ambulatory Visit (HOSPITAL_COMMUNITY)
Admission: RE | Admit: 2020-09-26 | Discharge: 2020-09-26 | Disposition: A | Discharge status: Home / Self Care | Payer: Medicare Other | Source: Ambulatory Visit | Attending: Physician Assistant | Admitting: Physician Assistant

## 2020-09-26 ENCOUNTER — Ambulatory Visit (INDEPENDENT_AMBULATORY_CARE_PROVIDER_SITE_OTHER)
Admit: 2020-09-26 | Discharge: 2020-09-26 | Disposition: A | Discharge status: Home / Self Care | Payer: Medicare Other | Attending: Physician Assistant | Admitting: Physician Assistant

## 2020-09-26 VITALS — BP 153/73 | HR 63 | Temp 98.1°F | Resp 20 | Ht 63.0 in | Wt 146.8 lb

## 2020-09-26 DIAGNOSIS — I6522 Occlusion and stenosis of left carotid artery: Secondary | ICD-10-CM | POA: Diagnosis not present

## 2020-09-26 DIAGNOSIS — I739 Peripheral vascular disease, unspecified: Secondary | ICD-10-CM

## 2020-09-26 DIAGNOSIS — I70213 Atherosclerosis of native arteries of extremities with intermittent claudication, bilateral legs: Secondary | ICD-10-CM

## 2020-09-26 NOTE — Progress Notes (Signed)
Office Note     CC:  follow up Requesting Provider:  Celene Squibb, MD  HPI: Roy [name] is a 74 y.o. (Jun 22, 1946) male who presents for follow up of peripheral artery disease. At time of his last visit he had some elevated velocities on duplex in the left CFA and R CIA stent. He was subsequently scheduled for Angiogram. On 08/23/20 he had Abdominal aortogram with bilateral runoff, DCB angioplasty of his left common femoral artery and stenting of the right external iliac artery.  Today he reports no claudication symptoms, no rest pain or non healing wounds. He says occasionally he gets a tender area in right groin along scar but does not bother him while ambulating.   Prior to his Angiogram he had underwentright iliofemoral endarterectomy with patch angioplasty as well as common iliac and superficial femoral artery stenting on 5/6/2021by Dr. [name]. He had post operative complication of large lymphocele, which was healed at time of last visit in September. He has remote history of bilateral EIA stenting 05/04/2014.  He also underwent left femoral to above knee popliteal bypass with GSV with extensive left profunda femoral endarterectomy and left EIA endarterectomy on 08/16/2017.  In May 2019, he underwent atherectomy and drug coated angioplasty of the right SFA and right EIA.  On 03/08/2020, he underwent drug coated balloon angioplasty left CFA and stent of the right SFA by Dr. [name]  He was noted to have 70% left ICA stenosis on CTA by Dr. Estanislado Pandy prior to last visit. He reports no associated neurological symptoms since last visit. No amaurosis fugax or monocular blindness, no slurred speech, facial drooping, weakness or numbness of upper or lower extremities  The pt is on a statin for cholesterol management.    The pt is on an aspirin.    Other AC:  Brilinta The pt is on BB, ARB for hypertension.  The pt does not have diabetes. Tobacco hx: current, 1 ppd  Past Medical History:    Diagnosis Date  . AICD (automatic cardioverter/defibrillator) present 08/19/2015   St Jude BiV ICD for primary prevention by Dr. Lovena Le  . Alcohol abuse    6 beers per day; hospital admission in 2009 for withdrawal symptoms  . Alcoholic cirrhosis ([place])   . Anxiety and depression    denies   . Cerebrovascular disease 2009   TIA; 2009- right ICA stent; re-intervention for restenosis complicated by [facility] w/o sx  . CHF (congestive heart failure) ([place]) 06-02-14  . Chronic obstructive pulmonary disease ([place])   . Degenerative joint disease    Total shoulder arthroplasty-right  . Hyperlipidemia   . Hypertension   . LBBB (left bundle branch block)    Normal echo-2011; stress nuclear in 09/2010--septal hypoperfusion representing nontransmural infarction or the effect of left bundle branch block, no ischemia  . Myocardial infarction [facility]) June 02, 2014   Massive Heart Attack  . Peripheral vascular disease ([place])   . 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  . Thrombocytopenia ([place])   . Tobacco abuse    -100 pack years; 1.5 packs per day  . Traumatic seroma of thigh ([place])    left  . Tubular adenoma of colon     Past Surgical History:  Procedure Laterality Date  . ABDOMINAL AORTAGRAM [name] 05/04/2014   Procedure: ABDOMINAL Maxcine Ham;  Surgeon: [name], MD;  Location: [facility] CATH LAB;  Service: Cardiovascular;  Laterality: [name];  . ABDOMINAL AORTOGRAM [name] 06/25/2017   Procedure: Abdominal Aortogram;  Surgeon: [name],  MD;  Location: [place] CV LAB;  Service: Cardiovascular;  Laterality: [name];  . ABDOMINAL AORTOGRAM W/LOWER EXTREMITY [name] 04/22/2018   Procedure: ABDOMINAL AORTOGRAM W/LOWER EXTREMITY;  Surgeon: [name], MD;  Location: [place] CV LAB;  Service: Cardiovascular;  Laterality: [name];  . ABDOMINAL AORTOGRAM W/LOWER EXTREMITY [name] 03/08/2020   Procedure: ABDOMINAL AORTOGRAM W/LOWER EXTREMITY;  Surgeon: [name], MD;  Location: [place] CV LAB;   Service: Cardiovascular;  Laterality: [name];  . ABDOMINAL AORTOGRAM W/LOWER EXTREMITY Bilateral 08/23/2020   Procedure: ABDOMINAL AORTOGRAM W/LOWER EXTREMITY;  Surgeon: [name], MD;  Location: [place] CV LAB;  Service: Cardiovascular;  Laterality: Bilateral;  . AGILE CAPSULE [name] 03/18/2014   Procedure: AGILE CAPSULE;  Surgeon: Danie Binder, MD;  [facility];  Service: Endoscopy;  Laterality: [name];  7:30  . APPLICATION OF WOUND VAC Left 09/03/2017   Procedure: APPLICATION OF WOUND VAC;  Surgeon: Conrad Jamestown, MD;  Location: [place];  Service: Vascular;  Laterality: Left;  . APPLICATION OF WOUND VAC Right 04/07/2020   Procedure: PLACEMENT OF ANTIBIOTIC BEADS AND APPLICATION OF WOUND VAC;  Surgeon: [name], MD;  Location: [place];  Service: Vascular;  Laterality: Right;  . BACK SURGERY    . BACTERIAL OVERGROWTH TEST [name] 05/24/2015   Procedure: BACTERIAL OVERGROWTH TEST;  Surgeon: Danie Binder, MD;  [facility];  Service: Endoscopy;  Laterality: [name];  0700  . BI-VENTRICULAR IMPLANTABLE CARDIOVERTER DEFIBRILLATOR  (CRT-D)  08/19/2015  . CARPAL TUNNEL RELEASE Left 02/02/2016   Procedure: LEFT CARPAL TUNNEL RELEASE;  Surgeon: Daryll Brod, MD;  Location: [place];  Service: Orthopedics;  Laterality: Left;  ANESTHESIA: IV REGIONAL UPPER ARM  . CATARACT EXTRACTION W/PHACO Right 03/08/2015   Procedure: CATARACT EXTRACTION PHACO AND INTRAOCULAR LENS PLACEMENT (IOC);  Surgeon: Rutherford Guys, MD;  [facility];  Service: Ophthalmology;  Laterality: Right;  CDE:9.46  . CATARACT EXTRACTION W/PHACO Left 03/22/2015   Procedure: CATARACT EXTRACTION PHACO AND INTRAOCULAR LENS PLACEMENT (IOC);  Surgeon: Rutherford Guys, MD;  [facility];  Service: Ophthalmology;  Laterality: Left;  CDE:5.80  . COLONOSCOPY  08/22/09   Fields-(Tubular Adenoma)3-mm transverse polyp/4-mm polyp otherwise noraml/small internal hemorrhoids  . COLONOSCOPY [name] 04/14/2014   NFA:OZHYQ  internal hemorrhids/normal mocsa in the terminal iluem/left colonis redundant  . COLONOSCOPY W/ POLYPECTOMY  2011  . ENDARTERECTOMY FEMORAL Left 08/16/2017   Procedure: ENDARTERECTOMY LEFT PROFUNDA FEMORAL;  Surgeon: [name], MD;  Location: [place];  Service: Vascular;  Laterality: Left;  . ENDARTERECTOMY FEMORAL Right 03/31/2020   Procedure: RIGHT FEMORAL ENDARTERECTOMY;  Surgeon: [name], MD;  Location: [place];  Service: Vascular;  Laterality: Right;  . EP IMPLANTABLE DEVICE [name] 08/19/2015   Procedure: BiV ICD Insertion CRT-D;  Surgeon: Evans Lance, MD;  Location: [place] CV LAB;  Service: Cardiovascular;  Laterality: [name];  . ESOPHAGOGASTRODUODENOSCOPY [name] 03/05/2014   SLF: 1. Stricture at the gastroesophagael junction 2. Small hiatal hernia 3. Moderate non-erosive gastritis and duodentitis. 4. No surce for Melena identified.   . FEMORAL-POPLITEAL BYPASS GRAFT Left 08/16/2017   Procedure: LEFT  FEMORAL-POPLITEAL ARTERY  BYPASS GRAFT;  Surgeon: [name], MD;  Location: [place];  Service: Vascular;  Laterality: Left;  . GIVENS CAPSULE STUDY [name] 03/30/2014   Procedure: GIVENS CAPSULE STUDY;  Surgeon: Danie Binder, MD;  [facility];  Service: Endoscopy;  Laterality: [name];  7:30  . GROIN DEBRIDEMENT Right 04/07/2020   Procedure:  INCISION AND DRAINAGE OF RIGHT GROIN;  Surgeon: [name], MD;  Location: [facility] OR;  Service: Vascular;  Laterality: Right;  . I & D EXTREMITY Left 09/03/2017   Procedure: IRRIGATION AND DEBRIDEMENT EXTREMITY LEFT THIGH SEROMA;  Surgeon: Conrad Rensselaer, MD;  Location: [place];  Service: Vascular;  Laterality: Left;  . INSERT / REPLACE / REMOVE PACEMAKER    . INSERTION OF ILIAC STENT  03/31/2020   Procedure: INSERTION OF RIGHT ILIAC ARTERY STENT AND RIGHT SUPERFICIAL FEMORAL ARTERY STENT;  Surgeon: [name], MD;  [facility];  Service: Vascular;;  . IR ANGIO INTRA EXTRACRAN SEL COM CAROTID INNOMINATE BILAT MOD SED  07/16/2017  . IR  ANGIO INTRA EXTRACRAN SEL COM CAROTID INNOMINATE BILAT MOD SED  06/12/2019  . IR ANGIO VERTEBRAL SEL SUBCLAVIAN INNOMINATE BILAT MOD SED  07/16/2017  . IR ANGIO VERTEBRAL SEL SUBCLAVIAN INNOMINATE BILAT MOD SED  06/12/2019  . IR RADIOLOGIST EVAL & MGMT  04/01/2017  . IR RADIOLOGIST EVAL & MGMT  08/02/2017  . IR TRANSCATH EXCRAN VERT OR CAR A STENT  07/22/2019  . IR US GUIDE VASC ACCESS RIGHT  06/12/2019  . JOINT REPLACEMENT Right    Total Shoulder Replacement   . LOWER EXTREMITY ANGIOGRAPHY Bilateral 06/25/2017   Procedure: Lower Extremity Angiography;  Surgeon: [name], MD;  Location: [place] CV LAB;  Service: Cardiovascular;  Laterality: Bilateral;  . LUMBAR FUSION  2010  . PATCH ANGIOPLASTY Right 03/31/2020   Procedure: PATCH ANGIOPLASTY USING XENOSURE BIOLOGIC PATCH 1cm x 14cm;  Surgeon: [name], MD;  [facility] OR;  Service: Vascular;  Laterality: Right;  . PERIPHERAL VASCULAR ATHERECTOMY Right 04/22/2018   Procedure: PERIPHERAL VASCULAR ATHERECTOMY;  Surgeon: [name], MD;  Location: [place] CV LAB;  Service: Cardiovascular;  Laterality: Right;  right superficial femoral  . PERIPHERAL VASCULAR BALLOON ANGIOPLASTY Right 04/22/2018   Procedure: PERIPHERAL VASCULAR BALLOON ANGIOPLASTY;  Surgeon: [name], MD;  Location: [place] CV LAB;  Service: Cardiovascular;  Laterality: Right;  external iliac  . PERIPHERAL VASCULAR BALLOON ANGIOPLASTY Left 03/08/2020   Procedure: PERIPHERAL VASCULAR BALLOON ANGIOPLASTY;  Surgeon: [name], MD;  Location: [place] CV LAB;  Service: Cardiovascular;  Laterality: Left;  Common Femoral  . PERIPHERAL VASCULAR BALLOON ANGIOPLASTY Left 08/23/2020   Procedure: PERIPHERAL VASCULAR BALLOON ANGIOPLASTY;  Surgeon: [name], MD;  Location: [place] CV LAB;  Service: Cardiovascular;  Laterality: Left;  common femoral  . PERIPHERAL VASCULAR INTERVENTION Right 03/08/2020   Procedure: PERIPHERAL VASCULAR INTERVENTION;   Surgeon: [name], MD;  Location: [place] CV LAB;  Service: Cardiovascular;  Laterality: Right;  SFA  . PERIPHERAL VASCULAR INTERVENTION Right 08/23/2020   Procedure: PERIPHERAL VASCULAR INTERVENTION;  Surgeon: [name], MD;  Location: [place] CV LAB;  Service: Cardiovascular;  Laterality: Right;  external iliac  . RADIOLOGY WITH ANESTHESIA [name] 07/01/2019   Procedure: STENTING;  Surgeon: Luanne Bras, MD;  Location: [place];  Service: Radiology;  Laterality: [name];  . RADIOLOGY WITH ANESTHESIA [name] 07/22/2019   Procedure: STENTING;  Surgeon: Luanne Bras, MD;  Location: [place];  Service: Radiology;  Laterality: [name];  . TOTAL SHOULDER ARTHROPLASTY Right 2011   Dr. Tamera Punt  . ULNAR NERVE TRANSPOSITION Left 02/02/2016   Procedure: LEFT IN-SITU DECOMPRESSION ULNAR NERVE ;  Surgeon: Daryll Brod, MD;  Location: [place];  Service: Orthopedics;  Laterality: Left;  . ULNAR TUNNEL RELEASE Left 02/02/2016   Procedure: LEFT  CUBITAL TUNNEL RELEASE;  Surgeon: Daryll Brod, MD;  Location: [place];  Service: Orthopedics;  Laterality: Left;  [name] VASECTOMY  1971  . VEIN HARVEST Left 08/16/2017   Procedure: USING NON REVERSE LEFT GREATER SAPHENOUS VEIN HARVEST;  Surgeon: [name], MD;  [facility];  Service: Vascular;  Laterality: Left;    Social History   Socioeconomic History  . Marital status: Married    Spouse name: Not on file  . Number of children: 2  . Years of education: Not on file  . Highest education level: Not on file  Occupational History  . Occupation: retired, Clinical biochemist    Comment: Programmer, systems: RETIRED  Tobacco Use  . Smoking status: Current Every Day Smoker    Packs/day: 1.00    Years: 60.00    Pack years: 60.00    Types: Cigarettes    Start date: 08/20/1957  . Smokeless tobacco: Never Used  Vaping Use  . Vaping Use: Former  . Start date: 09/10/2016  . Quit date: 10/11/2016  Substance and Sexual  Activity  . Alcohol use: No    Alcohol/week: 0.0 standard drinks    Comment: quit in July 2015  . Drug use: No  . Sexual activity: Not Currently  Other Topics Concern  . Not on file  Social History Narrative   Accompanied by daughter [name]   Lives w/ wife, daughter   Social Determinants of Health   Financial Resource Strain:   . Difficulty of Paying Living Expenses: Not on file  Food Insecurity:   . Worried About Charity fundraiser in the Last Year: Not on file  . Ran Out of Food in the Last Year: Not on file  Transportation Needs:   . Lack of Transportation (Medical): Not on file  . Lack of Transportation (Non-Medical): Not on file  Physical Activity:   . Days of Exercise per Week: Not on file  . Minutes of Exercise per Session: Not on file  Stress:   . Feeling of Stress : Not on file  Social Connections:   . Frequency of Communication with Friends and Family: Not on file  . Frequency of Social Gatherings with Friends and Family: Not on file  . Attends Religious Services: Not on file  . Active Member of Clubs or Organizations: Not on file  . Attends Archivist Meetings: Not on file  . Marital Status: Not on file  Intimate Partner Violence:   . Fear of Current or Ex-Partner: Not on file  . Emotionally Abused: Not on file  . Physically Abused: Not on file  . Sexually Abused: Not on file    Family History  Problem Relation Age of Onset  . Coronary artery disease Mother   . Diabetes Mother   . Heart disease Mother        Before age 45 - 32 Bypasses  . Hypertension Mother   . Heart attack Mother        3-4 Heart attacks  . Alzheimer's disease Father   . Diabetes Father   . Heart attack Sister   . Cancer Brother        Prostate  . Hyperlipidemia Son   . Prostate cancer Brother   . Alzheimer's disease Sister   . Colon cancer Neg Hx     Current Outpatient Medications  Medication Sig Dispense Refill  . acetaminophen (TYLENOL) 500 MG tablet  Take 1,000 mg by mouth as needed for moderate  pain or headache.     [name] aspirin EC 81 MG tablet Take 81 mg by mouth every evening.     [name] atorvastatin (LIPITOR) 20 MG tablet Take 20 mg by mouth daily.     [name] BRILINTA 90 MG TABS tablet Take 90 mg by mouth 2 (two) times daily.    . carvedilol (COREG) 3.125 MG tablet TAKE 1 TABLET TWICE A DAY (Patient taking differently: Take 3.125 mg by mouth 2 (two) times daily. ) 180 tablet 0  . dextromethorphan-guaiFENesin (MUCINEX DM) 30-600 MG 12hr tablet Take 1 tablet by mouth daily.     [name] ENTRESTO 49-51 MG TAKE 1 TABLET TWICE A DAY (Patient taking differently: Take 1 tablet by mouth in the morning and at bedtime. ) 180 tablet 4  . ferrous sulfate 325 (65 FE) MG tablet Take 325 mg by mouth every Monday, Wednesday, and Friday.    . gabapentin (NEURONTIN) 300 MG capsule Take 300 mg by mouth 2 (two) times daily.     . magnesium oxide (MAG-OX) 400 MG tablet Take 400 mg by mouth daily.    . Multiple Vitamins-Minerals (CENTRUM SILVER PO) Take 1 tablet by mouth daily. 50+    . Omega-3 Fatty Acids (FISH OIL) 1000 MG CAPS Take 1,000 mg by mouth daily.    [name] oxyCODONE-acetaminophen (PERCOCET/ROXICET) 5-325 MG tablet Take 1 tablet by mouth daily as needed for moderate pain.     . SUPER B COMPLEX/C PO Take 1 tablet by mouth at bedtime.     . tamsulosin (FLOMAX) 0.4 MG CAPS capsule Take 0.4 mg by mouth 2 (two) times daily.     . traMADol (ULTRAM) 50 MG tablet Take 50 mg by mouth daily as needed for moderate pain.      No current facility-administered medications for this visit.    No Known Allergies   REVIEW OF SYSTEMS:  [X]  denotes positive finding, [ ]  denotes negative finding Cardiac  Comments:  Chest pain or chest pressure:    Shortness of breath upon exertion:    Short of breath when lying flat:    Irregular heart rhythm:        Vascular    Pain in calf, thigh, or hip brought on by ambulation:    Pain in feet at night that wakes you up from your sleep:       Blood clot in your veins:    Leg swelling:         Pulmonary    Oxygen at home:    Productive cough:     Wheezing:         Neurologic    Sudden weakness in arms or legs:     Sudden numbness in arms or legs:     Sudden onset of difficulty speaking or slurred speech:    Temporary loss of vision in one eye:     Problems with dizziness:         Gastrointestinal    Blood in stool:     Vomited blood:         Genitourinary    Burning when urinating:     Blood in urine:        Psychiatric    Major depression:         Hematologic    Bleeding problems:    Problems with blood clotting too easily:        Skin    Rashes or ulcers:        Constitutional  Fever or chills:      PHYSICAL EXAMINATION:  There were no vitals filed for this visit.  General:  WDWN in NAD; vital signs documented above Gait: Normal HENT: WNL, normocephalic Pulmonary: normal non-labored breathing , without wheezing Cardiac: regular HR, without  Murmurs with left carotid bruit Abdomen: soft, NT, no masses Vascular Exam/Pulses:  Right Left  Radial 2+ (normal) 2+ (normal)  Ulnar Not palpable Not palpable  Femoral 2+ (normal) 2+ (normal)  Popliteal Not palpable Not palpable  DP 2+ (normal) 2+ (normal)  PT Not palpable Not palpable   Extremities: without ischemic changes, without Gangrene , without cellulitis; without open wounds;  Musculoskeletal: no muscle wasting or atrophy  Neurologic: A&O X 3;  No focal weakness or paresthesias are detected Psychiatric:  The pt has Normal affect.   Non-Invasive Vascular Imaging:  09/26/20 +-------+-----------+-----------+------------+------------+  ABI/TBIToday's ABIToday's TBIPrevious ABIPrevious TBI  +-------+-----------+-----------+------------+------------+  Right 0.90    absent   0.89    0.62      +-------+-----------+-----------+------------+------------+  Left  0.88    0.54    0.87    0.50       +-------+-----------+-----------+------------+------------+   VAS US Aorta/iliac:  Stenosis: +--------------------+-------------+-----------+  Location      Stenosis   Stent     +--------------------+-------------+-----------+  Right Common Iliac        no stenosis  +--------------------+-------------+-----------+  Right External Iliac       no stenosis  +--------------------+-------------+-----------+  Left External Iliac >50% stenosis        +--------------------+-------------+-----------+  Velocity appears to be distal to the end of the stent. Patent right common femoral artery. Patent left common femoral artery with mildly elevated velocities (50-74%) leading up to the endarterectomy site; most likely due to change in vessel diameter.    Impression From Angiogram:               #1  70% distal right external iliac artery stenosis treated using a 7 x 60 Elluvia stent with no residual stenosis.               #2  Approximate 50% stenosis in the proximal left common femoral artery which correlated with the ultrasound findings.  This was treated using a 5 x 40 drug-coated Ranger balloon               #3  Approximate 50% right common femoral artery stenosis and right profundofemoral stenosis.               #4  Widely patent left femoral-popliteal bypass graft               #5  Widely patent right superficial femoral artery stents.   ASSESSMENT/PLAN:: 74 y.o. male here for follow up for peripheral artery disease. He is s/p angiogram with stenting of right external iliac artery and DCB angioplasty of left CFA. His lower extremities are well perfused with palpable DP bilaterally. He his asymptomatic. His ABI/TBI is essentially unchanged. The Right TBI was absent but I think it is false reading secondary to cold exam room. Duplex shows some irregularity and elevated velocities distal to left EIA stent and size gradient irregularity in the left CFA. The  right CIA and EIA appear patent. We will continue to monitor these areas at time of follow up. He has 70% left ICA stenosis that we are following as well. He remains asymptomatic. He will have duplex evaluation at his next follow up - he will continue his aspirin, statin  and brillinta - he will follow up sooner should he have new concerning symptoms - He will follow up with Dr. [name] in February with Carotid Duplex, Aorto/iliac duplex and ABIs   Karoline Caldwell, PA-C [facility] [phone]  Clinic MD:  Dr. [name]

## 2020-10-05 ENCOUNTER — Ambulatory Visit (INDEPENDENT_AMBULATORY_CARE_PROVIDER_SITE_OTHER): Payer: Medicare Other

## 2020-10-05 DIAGNOSIS — I255 Ischemic cardiomyopathy: Secondary | ICD-10-CM

## 2020-10-05 LAB — CUP PACEART REMOTE DEVICE CHECK
Battery Remaining Longevity: 29 mo
Battery Remaining Percentage: 34 %
Battery Voltage: 2.89 V
Brady Statistic AP VP Percent: 29 %
Brady Statistic AP VS Percent: 1 %
Brady Statistic AS VP Percent: 70 %
Brady Statistic AS VS Percent: 1 %
Brady Statistic RA Percent Paced: 28 %
Date Time Interrogation Session: 20211110031228
HighPow Impedance: 77 Ohm
HighPow Impedance: 77 Ohm
Implantable Lead Implant Date: 20160923
Implantable Lead Implant Date: 20160923
Implantable Lead Implant Date: 20160923
Implantable Lead Location: 753858
Implantable Lead Location: 753859
Implantable Lead Location: 753860
Implantable Lead Model: 7122
Implantable Pulse Generator Implant Date: 20160923
Lead Channel Impedance Value: 460 Ohm
Lead Channel Impedance Value: 480 Ohm
Lead Channel Impedance Value: 760 Ohm
Lead Channel Pacing Threshold Amplitude: 0.75 V
Lead Channel Pacing Threshold Amplitude: 0.875 V
Lead Channel Pacing Threshold Amplitude: 1.25 V
Lead Channel Pacing Threshold Pulse Width: 0.5 ms
Lead Channel Pacing Threshold Pulse Width: 0.5 ms
Lead Channel Pacing Threshold Pulse Width: 0.8 ms
Lead Channel Sensing Intrinsic Amplitude: 12 mV
Lead Channel Sensing Intrinsic Amplitude: 2.6 mV
Lead Channel Setting Pacing Amplitude: 2 V
Lead Channel Setting Pacing Amplitude: 2 V
Lead Channel Setting Pacing Amplitude: 2.25 V
Lead Channel Setting Pacing Pulse Width: 0.5 ms
Lead Channel Setting Pacing Pulse Width: 0.8 ms
Lead Channel Setting Sensing Sensitivity: 1 mV
Pulse Gen Serial Number: 7294918

## 2020-10-06 NOTE — Progress Notes (Signed)
Remote ICD transmission.   

## 2020-10-10 NOTE — Progress Notes (Signed)
Cardiology Office Note  Date: 10/11/2020   ID: Roy [name], DOB [birth year], MRN [id number]  PCP:  Celene Squibb, MD  Cardiologist:  No primary care provider on file. Electrophysiologist:  None   Chief Complaint: Follow-up ischemic cardiomyopathy  History of Present Illness: Roy [name] is a 74 y.o. male with a history of ischemic cardiomyopathy, chronic systolic heart failure, status post BiV ICD, cerebrovascular disease with TIA with right ICA stent, COPD, alcoholic cirrhosis, alcohol abuse, PVD, tobacco abuse, thrombocytopenia.  Last encounter with Dr. Lovena Le EP 07/13/2020 for ongoing follow-up and troubleshooting of his ICD.  He had developed some noise on his ICD lead.  Dr. Lovena Le reduced his sensitivity on that visit.  Having class II CHF symptoms.  Denied any anginal symptoms.  Limited in activity by claudication.  Encouraged to stop smoking.  Had a peripheral vascular balloon angioplasty, abdominal aortogram with lower extremity peripheral vascular intervention on 08/23/2020 by Dr. [name].  Had 70% distal right external iliac artery stenosis treated using a 7 x 60 Liviu stent with no residual stenosis.  Approximate 50% stenosis in the proximal left common femoral artery which correlated with ultrasound findings.  This was treated with a 5 x 40 drug coated Ranger balloon.  Approximately 3% right common femoral artery stenosis and right profundofemoral stenosis.  Widely patent left femoral-popliteal bypass graft.  Widely patent right superficial femoral artery stents.  He is here today for 54-month follow-up.  He denies any recent acute illnesses or hospitalizations.  Denies any significant anginal or exertional symptoms, palpitations or arrhythmias, orthostatic symptoms, CVA or TIA-like symptoms, PND, orthopnea, bleeding.  Recent peripheral vascular intervention with Dr. [name] with stenting of distal right external iliac artery balloon angioplasty 50% stenosis in proximal left  common femoral artery.  See results above.  Currently denies any claudication-like symptoms. Recent device check of his ICD showed battery life 2.4 years.  He has some interference in one of his leads.  He was given a magnet in the event of AICD shock due to lead interference.[name]  He states Dr. Lovena Le plans to replace the lead later when device needs a generator change.  He states Dr. Estanislado Pandy plans to intervene on the disease in his carotid arteries in the near future.  See recent CT angio of head and neck low with significant carotid disease noted.  He denies any current claudication-like symptoms, lower extremity edema.  He continues to smoke.   Past Medical History:  Diagnosis Date  . AICD (automatic cardioverter/defibrillator) present 08/19/2015   St Jude BiV ICD for primary prevention by Dr. Lovena Le  . Alcohol abuse    6 beers per day; hospital admission in 2009 for withdrawal symptoms  . Alcoholic cirrhosis ([place])   . Anxiety and depression    denies   . Cerebrovascular disease 2009   TIA; 2009- right ICA stent; re-intervention for restenosis complicated by [facility] w/o sx  . CHF (congestive heart failure) ([place]) 06-02-14  . Chronic obstructive pulmonary disease ([place])   . Degenerative joint disease    Total shoulder arthroplasty-right  . Hyperlipidemia   . Hypertension   . LBBB (left bundle branch block)    Normal echo-2011; stress nuclear in 09/2010--septal hypoperfusion representing nontransmural infarction or the effect of left bundle branch block, no ischemia  . Myocardial infarction [facility]) June 02, 2014   Massive Heart Attack  . Peripheral vascular disease ([place])   . 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  . Thrombocytopenia ([place])   .  Tobacco abuse    -100 pack years; 1.5 packs per day  . Traumatic seroma of thigh ([place])    left  . Tubular adenoma of colon     Past Surgical History:  Procedure Laterality Date  . ABDOMINAL AORTAGRAM [name] 05/04/2014   Procedure: ABDOMINAL  Maxcine Ham;  Surgeon: [name], MD;  Location[facility] CATH LAB;  Service: Cardiovascular;  Laterality: [name];  . ABDOMINAL AORTOGRAM [name] 06/25/2017   Procedure: Abdominal Aortogram;  Surgeon: [name], MD;  Location: [place] CV LAB;  Service: Cardiovascular;  Laterality: [name];  . ABDOMINAL AORTOGRAM W/LOWER EXTREMITY [name] 04/22/2018   Procedure: ABDOMINAL AORTOGRAM W/LOWER EXTREMITY;  Surgeon: [name], MD;  Location: [place] CV LAB;  Service: Cardiovascular;  Laterality: [name];  . ABDOMINAL AORTOGRAM W/LOWER EXTREMITY [name] 03/08/2020   Procedure: ABDOMINAL AORTOGRAM W/LOWER EXTREMITY;  Surgeon: [name], MD;  Location: [place] CV LAB;  Service: Cardiovascular;  Laterality: [name];  . ABDOMINAL AORTOGRAM W/LOWER EXTREMITY Bilateral 08/23/2020   Procedure: ABDOMINAL AORTOGRAM W/LOWER EXTREMITY;  Surgeon: [name], MD;  Location: [place] CV LAB;  Service: Cardiovascular;  Laterality: Bilateral;  . AGILE CAPSULE [name] 03/18/2014   Procedure: AGILE CAPSULE;  Surgeon: Danie Binder, MD;  [facility];  Service: Endoscopy;  Laterality: [name];  7:30  . APPLICATION OF WOUND VAC Left 09/03/2017   Procedure: APPLICATION OF WOUND VAC;  Surgeon: Conrad Manville, MD;  Location: [place];  Service: Vascular;  Laterality: Left;  . APPLICATION OF WOUND VAC Right 04/07/2020   Procedure: PLACEMENT OF ANTIBIOTIC BEADS AND APPLICATION OF WOUND VAC;  Surgeon: [name], MD;  Location: [place];  Service: Vascular;  Laterality: Right;  . BACK SURGERY    . BACTERIAL OVERGROWTH TEST [name] 05/24/2015   Procedure: BACTERIAL OVERGROWTH TEST;  Surgeon: Danie Binder, MD;  [facility];  Service: Endoscopy;  Laterality: [name];  0700  . BI-VENTRICULAR IMPLANTABLE CARDIOVERTER DEFIBRILLATOR  (CRT-D)  08/19/2015  . CARPAL TUNNEL RELEASE Left 02/02/2016   Procedure: LEFT CARPAL TUNNEL RELEASE;  Surgeon: Daryll Brod, MD;  Location: [place];  Service: Orthopedics;   Laterality: Left;  ANESTHESIA: IV REGIONAL UPPER ARM  . CATARACT EXTRACTION W/PHACO Right 03/08/2015   Procedure: CATARACT EXTRACTION PHACO AND INTRAOCULAR LENS PLACEMENT (IOC);  Surgeon: Rutherford Guys, MD;  Location: [facility];  Service: Ophthalmology;  Laterality: Right;  CDE:9.46  . CATARACT EXTRACTION W/PHACO Left 03/22/2015   Procedure: CATARACT EXTRACTION PHACO AND INTRAOCULAR LENS PLACEMENT (IOC);  Surgeon: Rutherford Guys, MD;  Location: [facility];  Service: Ophthalmology;  Laterality: Left;  CDE:5.80  . COLONOSCOPY  08/22/09   Fields-(Tubular Adenoma)3-mm transverse polyp/4-mm polyp otherwise noraml/small internal hemorrhoids  . COLONOSCOPY [name] 04/14/2014   SAY:TKZSW internal hemorrhids/normal mocsa in the terminal iluem/left colonis redundant  . COLONOSCOPY W/ POLYPECTOMY  2011  . ENDARTERECTOMY FEMORAL Left 08/16/2017   Procedure: ENDARTERECTOMY LEFT PROFUNDA FEMORAL;  Surgeon: [name], MD;  Location: [place];  Service: Vascular;  Laterality: Left;  . ENDARTERECTOMY FEMORAL Right 03/31/2020   Procedure: RIGHT FEMORAL ENDARTERECTOMY;  Surgeon: [name], MD;  Location: [place];  Service: Vascular;  Laterality: Right;  . EP IMPLANTABLE DEVICE [name] 08/19/2015   Procedure: BiV ICD Insertion CRT-D;  Surgeon: Evans Lance, MD;  Location: [place] CV LAB;  Service: Cardiovascular;  Laterality: [name];  . ESOPHAGOGASTRODUODENOSCOPY [name] 03/05/2014   SLF: 1. Stricture at the gastroesophagael junction 2. Small hiatal hernia 3. Moderate non-erosive  gastritis and duodentitis. 4. No surce for Melena identified.   . FEMORAL-POPLITEAL BYPASS GRAFT Left 08/16/2017   Procedure: LEFT  FEMORAL-POPLITEAL ARTERY  BYPASS GRAFT;  Surgeon: [name], MD;  Location: [place];  Service: Vascular;  Laterality: Left;  . GIVENS CAPSULE STUDY [name] 03/30/2014   Procedure: GIVENS CAPSULE STUDY;  Surgeon: Danie Binder, MD;  [facility];  Service: Endoscopy;  Laterality: [name];  7:30  . GROIN DEBRIDEMENT Right  04/07/2020   Procedure: INCISION AND DRAINAGE OF RIGHT GROIN;  Surgeon: [name], MD;  [facility];  Service: Vascular;  Laterality: Right;  . I & D EXTREMITY Left 09/03/2017   Procedure: IRRIGATION AND DEBRIDEMENT EXTREMITY LEFT THIGH SEROMA;  Surgeon: Conrad [place], MD;  Location: [place];  Service: Vascular;  Laterality: Left;  . INSERT / REPLACE / REMOVE PACEMAKER    . INSERTION OF ILIAC STENT  03/31/2020   Procedure: INSERTION OF RIGHT ILIAC ARTERY STENT AND RIGHT SUPERFICIAL FEMORAL ARTERY STENT;  Surgeon: [name], MD;  [facility];  Service: Vascular;;  . IR ANGIO INTRA EXTRACRAN SEL COM CAROTID INNOMINATE BILAT MOD SED  07/16/2017  . IR ANGIO INTRA EXTRACRAN SEL COM CAROTID INNOMINATE BILAT MOD SED  06/12/2019  . IR ANGIO VERTEBRAL SEL SUBCLAVIAN INNOMINATE BILAT MOD SED  07/16/2017  . IR ANGIO VERTEBRAL SEL SUBCLAVIAN INNOMINATE BILAT MOD SED  06/12/2019  . IR RADIOLOGIST EVAL & MGMT  04/01/2017  . IR RADIOLOGIST EVAL & MGMT  08/02/2017  . IR TRANSCATH EXCRAN VERT OR CAR A STENT  07/22/2019  . IR US GUIDE VASC ACCESS RIGHT  06/12/2019  . JOINT REPLACEMENT Right    Total Shoulder Replacement   . LOWER EXTREMITY ANGIOGRAPHY Bilateral 06/25/2017   Procedure: Lower Extremity Angiography;  Surgeon: [name], MD;  Location: [place] CV LAB;  Service: Cardiovascular;  Laterality: Bilateral;  . LUMBAR FUSION  2010  . PATCH ANGIOPLASTY Right 03/31/2020   Procedure: PATCH ANGIOPLASTY USING XENOSURE BIOLOGIC PATCH 1cm x 14cm;  Surgeon: [name], MD;  Location: [facility] OR;  Service: Vascular;  Laterality: Right;  . PERIPHERAL VASCULAR ATHERECTOMY Right 04/22/2018   Procedure: PERIPHERAL VASCULAR ATHERECTOMY;  Surgeon: [name], MD;  Location: [place] CV LAB;  Service: Cardiovascular;  Laterality: Right;  right superficial femoral  . PERIPHERAL VASCULAR BALLOON ANGIOPLASTY Right 04/22/2018   Procedure: PERIPHERAL VASCULAR BALLOON ANGIOPLASTY;  Surgeon: [name], MD;  Location: [place] CV LAB;  Service: Cardiovascular;  Laterality: Right;  external iliac  . PERIPHERAL VASCULAR BALLOON ANGIOPLASTY Left 03/08/2020   Procedure: PERIPHERAL VASCULAR BALLOON ANGIOPLASTY;  Surgeon: [name], MD;  Location: [place] CV LAB;  Service: Cardiovascular;  Laterality: Left;  Common Femoral  . PERIPHERAL VASCULAR BALLOON ANGIOPLASTY Left 08/23/2020   Procedure: PERIPHERAL VASCULAR BALLOON ANGIOPLASTY;  Surgeon: [name], MD;  Location: [place] CV LAB;  Service: Cardiovascular;  Laterality: Left;  common femoral  . PERIPHERAL VASCULAR INTERVENTION Right 03/08/2020   Procedure: PERIPHERAL VASCULAR INTERVENTION;  Surgeon: [name], MD;  Location: [place] CV LAB;  Service: Cardiovascular;  Laterality: Right;  SFA  . PERIPHERAL VASCULAR INTERVENTION Right 08/23/2020   Procedure: PERIPHERAL VASCULAR INTERVENTION;  Surgeon: [name], MD;  Location: [place] CV LAB;  Service: Cardiovascular;  Laterality: Right;  external iliac  . RADIOLOGY WITH ANESTHESIA [name] 07/01/2019   Procedure: STENTING;  Surgeon: Luanne Bras, MD;  Location: [place];  Service: Radiology;  Laterality: [name];  . RADIOLOGY WITH ANESTHESIA [name] 07/22/2019   Procedure: STENTING;  Surgeon: Luanne Bras, MD;  Location: [place];  Service: Radiology;  Laterality: [name];  . TOTAL SHOULDER ARTHROPLASTY Right 2011   Dr. Tamera Punt  . ULNAR NERVE TRANSPOSITION Left 02/02/2016   Procedure: LEFT IN-SITU DECOMPRESSION ULNAR NERVE ;  Surgeon: Daryll Brod, MD;  Location: [place];  Service: Orthopedics;  Laterality: Left;  . ULNAR TUNNEL RELEASE Left 02/02/2016   Procedure: LEFT CUBITAL TUNNEL RELEASE;  Surgeon: Daryll Brod, MD;  Location: [place];  Service: Orthopedics;  Laterality: Left;  [name] VASECTOMY  1971  . VEIN HARVEST Left 08/16/2017   Procedure: USING NON REVERSE LEFT GREATER SAPHENOUS VEIN HARVEST;  Surgeon: [name], MD;   [facility];  Service: Vascular;  Laterality: Left;    Current Outpatient Medications  Medication Sig Dispense Refill  . acetaminophen (TYLENOL) 500 MG tablet Take 1,000 mg by mouth as needed for moderate pain or headache.     [name] aspirin EC 81 MG tablet Take 81 mg by mouth every evening.     [name] atorvastatin (LIPITOR) 20 MG tablet Take 20 mg by mouth daily.     [name] BRILINTA 90 MG TABS tablet Take 90 mg by mouth 2 (two) times daily.    . carvedilol (COREG) 3.125 MG tablet TAKE 1 TABLET TWICE A DAY (Patient taking differently: Take 3.125 mg by mouth 2 (two) times daily. ) 180 tablet 0  . dextromethorphan-guaiFENesin (MUCINEX DM) 30-600 MG 12hr tablet Take 1 tablet by mouth daily.     [name] ENTRESTO 49-51 MG TAKE 1 TABLET TWICE A DAY (Patient taking differently: Take 1 tablet by mouth in the morning and at bedtime. ) 180 tablet 4  . ferrous sulfate 325 (65 FE) MG tablet Take 325 mg by mouth every Monday, Wednesday, and Friday.    . gabapentin (NEURONTIN) 300 MG capsule Take 300 mg by mouth 2 (two) times daily.     . magnesium oxide (MAG-OX) 400 MG tablet Take 400 mg by mouth daily.    . Multiple Vitamins-Minerals (CENTRUM SILVER PO) Take 1 tablet by mouth daily. 50+    . Omega-3 Fatty Acids (FISH OIL) 1000 MG CAPS Take 1,000 mg by mouth daily.    [name] oxyCODONE-acetaminophen (PERCOCET/ROXICET) 5-325 MG tablet Take 1 tablet by mouth daily as needed for moderate pain.     . SUPER B COMPLEX/C PO Take 1 tablet by mouth at bedtime.     . tamsulosin (FLOMAX) 0.4 MG CAPS capsule Take 0.4 mg by mouth 2 (two) times daily.     . traMADol (ULTRAM) 50 MG tablet Take 50 mg by mouth daily as needed for moderate pain.      No current facility-administered medications for this visit.   Allergies:  Patient has no known allergies.   Social History: The patient  reports that he has been smoking cigarettes. He started smoking about 63 years ago. He has a 60.00 pack-year smoking history. He has never used smokeless  tobacco. He reports that he does not drink alcohol and does not use drugs.   Family History: The patient's family history includes Alzheimer's disease in his father and sister; Cancer in his brother; Coronary artery disease in his mother; Diabetes in his father and mother; Heart attack in his mother and sister; Heart disease in his mother; Hyperlipidemia in his son; Hypertension in his mother; Prostate cancer in his brother.   ROS:  Please see the  history of present illness. Otherwise, complete review of systems is positive for none.  All other systems are reviewed and negative.   Physical Exam: VS:  BP 138/70   Pulse 72   Ht 5\' 3"  (1.6 m)   Wt 150 lb (68 kg)   SpO2 96%   BMI 26.57 kg/m , BMI Body mass index is 26.57 kg/m.  Wt Readings from Last 3 Encounters:  10/11/20 150 lb (68 kg)  09/26/20 146 lb 12.8 oz (66.6 kg)  08/23/20 147 lb (66.7 kg)    General: Patient appears comfortable at rest. Neck: Supple, no elevated JVP or carotid bruits, no thyromegaly. Lungs: Clear to auscultation, nonlabored breathing at rest. Cardiac: Regular rate and rhythm, no S3 or significant systolic murmur, no pericardial rub. Extremities: No pitting edema, distal pulses 2+. Skin: Warm and dry. Musculoskeletal: No kyphosis. Neuropsychiatric: Alert and oriented x3, affect grossly appropriate.  ECG:  EKG March 22, 2020 electronic ventricular pacemaker rate of 68  Recent Labwork: 04/06/2020: ALT 11; AST 17; Platelets 291 08/23/2020: BUN 16; Creatinine, Ser 0.60; Hemoglobin 13.3; Potassium 4.3; Sodium 137     Component Value Date/Time   CHOL 177 01/08/2012 0809   TRIG 77 06/08/2014 0331   HDL 53 10/18/2010 1927   CHOLHDL 1.8 10/18/2010 1927   VLDL 8 10/18/2010 1927   [place]  10/18/2010 1927    35        Total Cholesterol/HDL:CHD Risk Coronary Heart Disease Risk Table                     Men   Women  1/2 Average Risk   3.4   3.3  Average Risk       5.0   4.4  2 X Average Risk   9.6   7.1  3 X  Average Risk  23.4   11.0        Use the calculated Patient Ratio above and the CHD Risk Table to determine the patient's CHD Risk.        ATP III CLASSIFICATION (LDL):  <100     mg/dL   Optimal  100-129  mg/dL   Near or Above                    Optimal  130-159  mg/dL   Borderline  160-189  mg/dL   High  >190     mg/dL   Very High    Other Studies Reviewed Today:  CT angiography of neck/head 07/14/2020 CTA NECK FINDINGS  Aortic arch: Diffuse atheromatous plaque. No dissection or aneurysm seen. There is a degree of narrowing at the brachiocephalic origin not well established due to poor bolus and calcified plaque blooming  Right carotid system: Diffuse atheromatous change along the common carotid and proximal ICA. Up to 50% stenosis at the ICA bulb due to calcified plaque.  Left carotid system: Very limited assessment intermittently at the level of the common carotid due to streak artifact and poor bolus. Calcified plaque at the bifurcation and bulb with bulb stenosis measuring 70% when measured from calcified between the ages of calcified plaque.  Vertebral arteries: Brachiocephalic origin stenosis as noted above. Calcified plaque at both V1 segments. The vertebral arteries are essentially not assessed on either side due to poor bolus quality. Strong left vertebral artery dominance. Both vertebral arteries are enhancing at the level of the dura.  Skeleton: Disc and facet degeneration. Edentulous with alveolar ridge atrophy above and below.  Other neck:  No significant incidental finding.  Upper chest: Centrilobular emphysema. Mild fibrotic changes at the apices  Review of the MIP images confirms the above findings  CTA HEAD FINDINGS  Anterior circulation: Right-sided siphon stent, lumen not assessed due to previously described limitations. There is a heavily calcified left carotid siphon with 60% stenosis by best estimate at the paraclinoid segment.  Hypoplastic/aplastic right A1 segment. Atheromatous narrowing seen to bilateral MCA branches. Asymmetric less filling of right MCA branches with an apparent high-grade right M1 stenosis.  Posterior circulation: Strong left vertebral artery dominance. Calcified plaque at both V4 segments. Calcified plaque at the basilar with at least moderate stenosis, small vessel size not permitting accurate measurement. No detected branch occlusion, beading, or aneurysm  Venous sinuses: Well opacified.  Anatomic variants: As above  Review of the MIP images confirms the above findings  IMPRESSION: 1. Severely limited CTA due to bolus timing/quality. The left side was injected and there is left brachiocephalic stenosis from pacer wires. In the future, recommend right-sided injection. 2. Estimated 70% left proximal ICA stenosis. 3. Estimated 50% right proximal ICA stenosis. 4. Intracranial ICA stent on the right which is likely patent. There appears to be high-grade stenosis at the right M1 segment. 5. Estimated 60% left paraclinoid ICA stenosis. 6. The vertebral arteries are very poorly characterized due to limitations. Subjectively moderate proximal vertebral stenosis due to calcified plaque.   09/26/2020 vascular ultrasound ABI Bilateral ABIs appear essentially unchanged compared to prior study on 08/08/2020. Summary: Right: Resting right ankle-brachial index indicates mild right lower extremity arterial disease. Left: Resting left ankle-brachial index indicates mild left lower extremity arterial disease. The left toebrachial index is abnormal.    ABDOMINAL AORTA STUDY 09/26/2020 Indications: Peripheral artery disease, patient currently experiencing no new leg problems. Risk Factors: Hypertension, hyperlipidemia, prior MI. Vascular Interventions: 2015: Bilateral EIA stents 2018: Left femoropopliteal bypass graft, Left profunda endarterectomy, Left EIA endarterectomy, Angioplasty of  the right EIA, and Angioplasty of the right SFA 03/31/2020: RCIA stent, R SFA stent, Endarterectomy right EIA, CFA, PF, and SFA. 08/23/2020: Right EIA stent and left common femoral artery angioplasty Performing Technologist: Ronal Fear RVS, RCS Examination Guidelines: A complete evaluation includes B-mode imaging, spectral Doppler, color Doppler, and power Doppler as needed of all accessible portions of each vessel. Bilateral testing is considered an integral part of a complete examination. Limited examinations for reoccurring indications may be performed as noted. Location AP (cm) Trans (cm) PSV (cm/s) Waveform Thrombus Comments RT CIA Prox 128 biphasic RT CIA Mid 148 biphasic RT CIA Distal 137 biphasic RT EIA Prox 124 RT EIA Mid 97 RT EIA Distal 120 LT EIA Prox 193 LT EIA Mid 138 LT EIA Distal 596 appears distal to stent end  [place]O [id number] 09/26/2020 Velocity appears to be distal to the end of the stent. Patent right common femoral artery. Patent left common femoral artery with mildly elevated velocities (50-74%) leading up to the endarterectomy site; most likely due to change in vessel diameter. *See table(s) above for measurements and observations. Location Stenosis Stent Right Common Iliac no stenosis Right External Iliac no stenosis Left External Iliac >50% stenosis Electronically signed by Harold Barban MD on 09/26/2020 at 10:21:52 AM.   Peripheral vascular balloon angioplasty, abdominal aortogram with left lower extremity peripheral vascular intervention 08/23/2020 Impression:               #1  70% distal right external iliac artery stenosis treated using a 7 x 60 Elluvia stent with no residual  stenosis.               #2  Approximate 50% stenosis in the proximal left common femoral artery which correlated with the ultrasound findings.  This was treated using a 5 x 40 drug-coated Ranger balloon               #3  Approximate 50% right common femoral  artery stenosis and right profundofemoral stenosis.               #4  Widely patent left femoral-popliteal bypass graft               #5  Widely patent right superficial femoral artery stents.    03/08/2020 Peripheral vascular catheterization Pre-operative Diagnosis:Right leg claudication Post-operative diagnosis:Same Surgeon:Wells Brabham Procedure Performed: 1. Ultrasound-guided access, left femoral artery 2. Abdominal aortogram 3. Bilateral lower extremity runoff 4. Stent, right superficial femoral artery 5. Drug-coated balloon angioplasty, left common femoral artery 6. Conscious sedation, 77 minutes 7. Closure device, Mynx  Indications:The patient has a history of left femoral-popliteal bypass graft. He has a known stable inflow stenosis. He presented with severe symptoms of claudication in the right leg and comes in today for further evaluation.  Procedure:The patient was identified in the holding area and taken to room 8. The patient was then placed supine on the table and prepped and draped in the usual sterile fashion. A time out was called. Conscious sedation was administered with the use of IV fentanyl and Versed under continuous physician and nurse monitoring. Heart rate, blood pressure, and oxygen saturation were continuously monitored. Total sedation time was 77 minutes. Ultrasound was used to evaluate the left common femoral artery. It was patent . A digital ultrasound image was acquired. A micropuncture needle was used to access the left common femoral artery under ultrasound guidance. An 018 wire was advanced without resistance and a micropuncture sheath was placed. The 018 wire was removed and a benson wire was placed. The micropuncture sheath was exchanged for a 5 french sheath. An omniflush catheter was advanced over the wire to the level of L-1.  An abdominal angiogram was obtained. Next, using the omniflush catheter, placed at the aortic bifurcation, pelvic imaging and bilateral lower extremity runoff was performed. Findings:  Aortogram:No significant renal artery stenosis. The infrarenal abdominal aorta is calcified without significant stenosis. There is a 50% stenosis at the origin of the right common iliac artery. The stent within the right external iliac artery is patent throughout its course. The left common iliac artery is calcified but without significant stenosis. The left external iliac artery and stent are patent throughout their course. Right Lower Extremity:The right common femoral artery is heavily calcified with approximate 50% stenosis. The profunda origin is occluded. The superficial femoral artery is patent with tandem greater than 80% stenoses. The popliteal artery is patent throughout its course with three-vessel runoff. Left Lower Extremity:The common femoral artery at the level of inguinal ligament shows approximately a 70% stenosis. There is patulous dilatation of the left common femoral artery consistent with prior endarterectomy. The profundofemoral arteries widely patent. Bypass graft is visualized with anastomosis to the above-knee popliteal artery which is widely patent. Peroneal artery is the dominant runoff.  Intervention:After the above images were acquired the decision made to proceed with intervention. A 7 French 45 cm sheath was advanced into the right external iliac artery. The patient was fully heparinized. Using a 035 Glidewire and quick cross, the lesions within the right superficial femoral artery were  successfully crossed. A versa core wire was placed. I elected to primarily stent this area. Two 7 x 120 Elluvia overlapping stents were placed and molded with a 6 mm balloon. Completion imaging showed resolution of the stenosis and better  opacification of the profundofemoral artery however it did appear occluded at its origin still. At this point, I felt surgical correction of the common femoral lesion was the next step and so the right side was aborted. The sheath was then withdrawn and further evaluation of the stenosis within the left common femoral artery was obtained. I elected to treat this with a 5 x 40 IMPACT drug-coated balloon. This is done for 2 minutes. There was a 70% stenosis which was down to less than 15% after treatment with no dissection. A minx was used for closure.  Impression: #1 Tandem near occlusive lesions in the right superficial femoral artery treated with primarily stenting using 7 x 120 overlapping Elluvia stents. After treatment, the profunda was better visualized however still appeared to be occluded at its origin. There is 50% minimum calcified stenosis within the right common femoral artery. #2 70% common femoral artery stenosis on the left treated using a 5 x 40 IMPACT balloon #3 The patient will be brought back to the office for discussions regarding right common femoral endarterectomy with patch angioplasty and simultaneous stenting of the proximal right common iliac artery 50% stenosis.  Vascular ultrasound ABI with or without TBI 03/03/2020 Right ABIs and TBIs appear essentially unchanged compared to prior study on 01/26/2019. Left ABIs appear decreased compared to prior study on 01/26/2019. Left TBIs appear essentially unchanged compared to prior exam on 01/26/2019. Summary: Right: Resting right ankle-brachial index indicates moderate right lower extremity arterial disease. The right toe-brachial index is abnormal. RT great toe pressure = 76 mmHg. Left: Resting left ankle-brachial index indicates mild left lower extremity arterial disease. The left toebrachial index is normal. LT Great toe pressure = 114 mmHg.  Echocardiogram 08/01/2017. Study  Conclusions   - Left ventricle: The cavity size was normal. Wall thickness was  normal. Systolic function was normal. The estimated ejection  fraction was in the range of 50% to 55%. Wall motion was normal;  there were no regional wall motion abnormalities. Doppler  parameters are consistent with abnormal left ventricular  relaxation (grade 1 diastolic dysfunction). Indeterminate filling  pressures.  - Aortic valve: Trileaflet; mildly thickened leaflets.  - Mitral valve: Calcified annulus. Normal thickness leaflets .  There was mild regurgitation.  - Atrial septum: No defect or patent foramen ovale was identified.   Impressions:  - When compared to the study dated 1/61/09, LV systolic function  has since normalized.    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  Near complete occlusion of the dominant left vertebral artery at its origin with retrograde opacification to the level of the near complete occlusion from the ipsilateral ascending cervical branch of the left thyrocervical trunk.  Approximately 70-75% intra stent of the caval cavernous segment of the right internal carotid artery stenosis within the previously positioned Wingspan stent.  Non opacification of the right anterior cerebral artery A1 segment. 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 CAD in native artery   2. Biventricular ICD (implantable cardioverter-defibrillator) in place   3. PAD (  peripheral artery disease) ([place])   4. Tobacco abuse disorder   5. Essential hypertension   6. Mixed hyperlipidemia     1. CAD in native artery Denies any recent anginal or exertional symptoms.  Continue aspirin 81  mg daily, Brilinta 90 mg p.o. twice daily.  Carvedilol 3.125 mg p.o. twice daily.   2. Biventricular ICD (implantable cardioverter-defibrillator) in place  [place] ICD placed by Dr. Lovena Le 2016. Was seen by Dr. Lovena Le July 13, 2020 for follow-up and troubleshooting of his ICD.  He did develop some noise on his ICD lead.  Dr. Lovena Le reduced his sensitivity.  Recent device check showed he had 2.4 years of battery life left.  He carries a magnet with him due to history of lead noise in the event that he has ICD shock.  He states when AICD nears the end of battery life Dr. Lovena Le intends to replace the generator and the lead causing interference at the same time.  10/05/2020 remote device check reviewed. Histograms appropriate. Leads and battery stable. EKG March 22, 2020 electronic ventricular pacemaker rhythm rate of 68  3. PAD (peripheral artery disease) (H[facility]) Recent peripheral arterial catheterization with 70 % distal right external iliac artery stenosis treated using a 7 x 60 eluding stent with no residual stenosis.  Approximately 50% stenosis in the proximal left common femoral artery treated with a 5 x 40 drug-coated Ranger balloon.  Approximately percent right common femoral artery stenosis and right profundofemoral stenosis.  Widely patent left femoral-popliteal bypass graft.  Widely patent right superficial femoral artery stents.  4. Tobacco abuse disorder Continues to smoke.  Advised cessation.  5. Essential hypertension Blood pressure under reasonable control with blood pressure today 138/70.  Continue carvedilol 3.125 mg p.o. daily.  6. Mixed hyperlipidemia Continue atorvastatin 20 mg daily.   7.  Chronic systolic heart failure Last echocardiogram showed improvement in EF.  Echocardiogram 08/01/2017 demonstrated EF of 50 to 55%.  G1 DD.  Mild MR.  Continue Entresto 49/51 mg p.o. twice daily.  Carvedilol 3.125 mg p.o. twice daily.  Medication Adjustments/Labs and Tests  Ordered: Current medicines are reviewed at length with the patient today.  Concerns regarding medicines are outlined above.   Disposition: Follow-up with Dr. Domenic Polite 6 months  Signed, Levell July, NP 10/11/2020 4:16 PM    [facility] [place], [place], [place] [address] Phone: [phone]; Fax: [phone]

## 2020-10-11 ENCOUNTER — Ambulatory Visit (INDEPENDENT_AMBULATORY_CARE_PROVIDER_SITE_OTHER): Payer: Medicare Other | Admitting: Family Medicine

## 2020-10-11 ENCOUNTER — Encounter: Payer: Self-pay | Admitting: Family Medicine

## 2020-10-11 VITALS — BP 138/70 | HR 72 | Ht 63.0 in | Wt 150.0 lb

## 2020-10-11 DIAGNOSIS — Z95810 Presence of automatic (implantable) cardiac defibrillator: Secondary | ICD-10-CM

## 2020-10-11 DIAGNOSIS — I739 Peripheral vascular disease, unspecified: Secondary | ICD-10-CM

## 2020-10-11 DIAGNOSIS — I2510 Atherosclerotic heart disease of native coronary artery without angina pectoris: Secondary | ICD-10-CM

## 2020-10-11 DIAGNOSIS — I5022 Chronic systolic (congestive) heart failure: Secondary | ICD-10-CM

## 2020-10-11 DIAGNOSIS — Z720 Tobacco use: Secondary | ICD-10-CM

## 2020-10-11 DIAGNOSIS — E782 Mixed hyperlipidemia: Secondary | ICD-10-CM

## 2020-10-11 DIAGNOSIS — I10 Essential (primary) hypertension: Secondary | ICD-10-CM | POA: Diagnosis not present

## 2020-10-11 NOTE — Patient Instructions (Signed)
Medication Instructions:  Continue all current medications.   Labwork: none  Testing/Procedures: none  Follow-Up: 6 months   Any Other Special Instructions Will Be Listed Below (If Applicable).   If you need a refill on your cardiac medications before your next appointment, please call your pharmacy.  

## 2020-11-09 ENCOUNTER — Other Ambulatory Visit: Payer: Self-pay

## 2020-11-09 ENCOUNTER — Ambulatory Visit (INDEPENDENT_AMBULATORY_CARE_PROVIDER_SITE_OTHER): Payer: Medicare Other | Admitting: Internal Medicine

## 2020-11-09 ENCOUNTER — Encounter: Payer: Self-pay | Admitting: Internal Medicine

## 2020-11-09 VITALS — BP 140/62 | HR 71 | Ht 63.0 in | Wt 150.8 lb

## 2020-11-09 DIAGNOSIS — Z95810 Presence of automatic (implantable) cardiac defibrillator: Secondary | ICD-10-CM | POA: Diagnosis not present

## 2020-11-09 DIAGNOSIS — I5022 Chronic systolic (congestive) heart failure: Secondary | ICD-10-CM | POA: Diagnosis not present

## 2020-11-09 DIAGNOSIS — I779 Disorder of arteries and arterioles, unspecified: Secondary | ICD-10-CM | POA: Diagnosis not present

## 2020-11-09 LAB — CUP PACEART INCLINIC DEVICE CHECK
Battery Remaining Longevity: 26 mo
Brady Statistic RA Percent Paced: 28 %
Brady Statistic RV Percent Paced: 99 %
Date Time Interrogation Session: 20211215161505
HighPow Impedance: 73.125
Implantable Lead Implant Date: 20160923
Implantable Lead Implant Date: 20160923
Implantable Lead Implant Date: 20160923
Implantable Lead Location: 753858
Implantable Lead Location: 753859
Implantable Lead Location: 753860
Implantable Lead Model: 7122
Implantable Pulse Generator Implant Date: 20160923
Lead Channel Impedance Value: 437.5 Ohm
Lead Channel Impedance Value: 450 Ohm
Lead Channel Impedance Value: 725 Ohm
Lead Channel Pacing Threshold Amplitude: 0.75 V
Lead Channel Pacing Threshold Amplitude: 0.75 V
Lead Channel Pacing Threshold Amplitude: 1.5 V
Lead Channel Pacing Threshold Pulse Width: 0.5 ms
Lead Channel Pacing Threshold Pulse Width: 0.5 ms
Lead Channel Pacing Threshold Pulse Width: 0.8 ms
Lead Channel Sensing Intrinsic Amplitude: 12 mV
Lead Channel Sensing Intrinsic Amplitude: 4.2 mV
Lead Channel Setting Pacing Amplitude: 2 V
Lead Channel Setting Pacing Amplitude: 2 V
Lead Channel Setting Pacing Amplitude: 2.5 V
Lead Channel Setting Pacing Pulse Width: 0.5 ms
Lead Channel Setting Pacing Pulse Width: 0.8 ms
Lead Channel Setting Sensing Sensitivity: 1 mV
Pulse Gen Serial Number: 7294918

## 2020-11-09 NOTE — Patient Instructions (Signed)
Medication Instructions:  None Today *If you need a refill on your cardiac medications before your next appointment, please call your pharmacy*   Lab Work: None Today If you have labs (blood work) drawn today and your tests are completely normal, you will receive your results only by: [name] MyChart Message (if you have MyChart) OR . A paper copy in the mail If you have any lab test that is abnormal or we need to change your treatment, we will call you to review the results.   Testing/Procedures: None Today   Follow-Up: At [facility], you and your health needs are our priority.  As part of our continuing mission to provide you with exceptional heart care, we have created designated Provider Care Teams.  These Care Teams include your primary Cardiologist (physician) and Advanced Practice Providers (APPs -  Physician Assistants and Nurse Practitioners) who all work together to provide you with the care you need, when you need it.  We recommend signing up for the patient portal called "MyChart".  Sign up information is provided on this After Visit Summary.  MyChart is used to connect with patients for Virtual Visits (Telemedicine).  Patients are able to view lab/test results, encounter notes, upcoming appointments, etc.  Non-urgent messages can be sent to your provider as well.   To learn more about what you can do with MyChart, go to [email or web address].    Your next appointment:   6 month(s)  The format for your next appointment:   In Person  Provider:   Cristopher Peru, MD   Other Instructions None Today

## 2020-11-09 NOTE — Progress Notes (Signed)
HPI Mr. Simson returns today for followup. He is a pleasant 74 yo man with a h/o chronic systolic heart failure, s/p biv ICD insertion. He has had some noise on his ICD lead and we have reprogrammed his device. He has not had syncope. When we saw him last 3 months ago, we gave him a magnet to keep with him. He has not had chest pain or sob.  No Known Allergies   Current Outpatient Medications  Medication Sig Dispense Refill  . acetaminophen (TYLENOL) 500 MG tablet Take 1,000 mg by mouth as needed for moderate pain or headache.     [name] aspirin EC 81 MG tablet Take 81 mg by mouth every evening.     [name] atorvastatin (LIPITOR) 20 MG tablet Take 20 mg by mouth daily.     [name] BRILINTA 90 MG TABS tablet Take 90 mg by mouth 2 (two) times daily.    . carvedilol (COREG) 3.125 MG tablet TAKE 1 TABLET TWICE A DAY (Patient taking differently: Take 3.125 mg by mouth 2 (two) times daily.) 180 tablet 0  . dextromethorphan-guaiFENesin (MUCINEX DM) 30-600 MG 12hr tablet Take 1 tablet by mouth daily.     [name] ENTRESTO 49-51 MG TAKE 1 TABLET TWICE A DAY (Patient taking differently: Take 1 tablet by mouth in the morning and at bedtime.) 180 tablet 4  . ferrous sulfate 325 (65 FE) MG tablet Take 325 mg by mouth every Monday, Wednesday, and Friday.    . gabapentin (NEURONTIN) 300 MG capsule Take 300 mg by mouth 2 (two) times daily.     . magnesium oxide (MAG-OX) 400 MG tablet Take 400 mg by mouth daily.    . Multiple Vitamins-Minerals (CENTRUM SILVER PO) Take 1 tablet by mouth daily. 50+    . Omega-3 Fatty Acids (FISH OIL) 1000 MG CAPS Take 1,000 mg by mouth daily.    [name] oxyCODONE-acetaminophen (PERCOCET/ROXICET) 5-325 MG tablet Take 1 tablet by mouth daily as needed for moderate pain.     . SUPER B COMPLEX/C PO Take 1 tablet by mouth at bedtime.     . tamsulosin (FLOMAX) 0.4 MG CAPS capsule Take 0.4 mg by mouth 2 (two) times daily.     . traMADol (ULTRAM) 50 MG tablet Take 50 mg by mouth daily as needed for moderate  pain.      No current facility-administered medications for this visit.     Past Medical History:  Diagnosis Date  . AICD (automatic cardioverter/defibrillator) present 08/19/2015   St Jude BiV ICD for primary prevention by Dr. Lovena Le  . Alcohol abuse    6 beers per day; hospital admission in 2009 for withdrawal symptoms  . Alcoholic cirrhosis ([place])   . Anxiety and depression    denies   . Cerebrovascular disease 2009   TIA; 2009- right ICA stent; re-intervention for restenosis complicated by [facility] w/o sx  . CHF (congestive heart failure) ([place]) 06-02-14  . Chronic obstructive pulmonary disease ([place])   . Degenerative joint disease    Total shoulder arthroplasty-right  . Hyperlipidemia   . Hypertension   . LBBB (left bundle branch block)    Normal echo-2011; stress nuclear in 09/2010--septal hypoperfusion representing nontransmural infarction or the effect of left bundle branch block, no ischemia  . Myocardial infarction [facility]) June 02, 2014   Massive Heart Attack  . Peripheral vascular disease ([place])   . 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  . Thrombocytopenia ([place])   . Tobacco abuse    -  100 pack years; 1.5 packs per day  . Traumatic seroma of thigh ([place])    left  . Tubular adenoma of colon     ROS:   All systems reviewed and negative except as noted in the HPI.   Past Surgical History:  Procedure Laterality Date  . ABDOMINAL AORTAGRAM [name] 05/04/2014   Procedure: ABDOMINAL Maxcine Ham;  Surgeon: [name], MD;  Location: [facility] CATH LAB;  Service: Cardiovascular;  Laterality: [name];  . ABDOMINAL AORTOGRAM [name] 06/25/2017   Procedure: Abdominal Aortogram;  Surgeon: [name], MD;  Location: [place] CV LAB;  Service: Cardiovascular;  Laterality: [name];  . ABDOMINAL AORTOGRAM W/LOWER EXTREMITY [name] 04/22/2018   Procedure: ABDOMINAL AORTOGRAM W/LOWER EXTREMITY;  Surgeon: [name], MD;  Location: [place] CV LAB;  Service: Cardiovascular;  Laterality: [name];   . ABDOMINAL AORTOGRAM W/LOWER EXTREMITY [name] 03/08/2020   Procedure: ABDOMINAL AORTOGRAM W/LOWER EXTREMITY;  Surgeon: [name], MD;  Location: [place] CV LAB;  Service: Cardiovascular;  Laterality: [name];  . ABDOMINAL AORTOGRAM W/LOWER EXTREMITY Bilateral 08/23/2020   Procedure: ABDOMINAL AORTOGRAM W/LOWER EXTREMITY;  Surgeon: [name], MD;  Location: [place] CV LAB;  Service: Cardiovascular;  Laterality: Bilateral;  . AGILE CAPSULE [name] 03/18/2014   Procedure: AGILE CAPSULE;  Surgeon: Danie Binder, MD;  [facility];  Service: Endoscopy;  Laterality: [name];  7:30  . APPLICATION OF WOUND VAC Left 09/03/2017   Procedure: APPLICATION OF WOUND VAC;  Surgeon: Conrad [place]en, MD;  Location: [place];  Service: Vascular;  Laterality: Left;  . APPLICATION OF WOUND VAC Right 04/07/2020   Procedure: PLACEMENT OF ANTIBIOTIC BEADS AND APPLICATION OF WOUND VAC;  Surgeon: [name], MD;  Location: [place];  Service: Vascular;  Laterality: Right;  . BACK SURGERY    . BACTERIAL OVERGROWTH TEST [name] 05/24/2015   Procedure: BACTERIAL OVERGROWTH TEST;  Surgeon: Danie Binder, MD;  [facility];  Service: Endoscopy;  Laterality: [name];  0700  . BI-VENTRICULAR IMPLANTABLE CARDIOVERTER DEFIBRILLATOR  (CRT-D)  08/19/2015  . CARPAL TUNNEL RELEASE Left 02/02/2016   Procedure: LEFT CARPAL TUNNEL RELEASE;  Surgeon: Daryll Brod, MD;  Location: [place];  Service: Orthopedics;  Laterality: Left;  ANESTHESIA: IV REGIONAL UPPER ARM  . CATARACT EXTRACTION W/PHACO Right 03/08/2015   Procedure: CATARACT EXTRACTION PHACO AND INTRAOCULAR LENS PLACEMENT (IOC);  Surgeon: Rutherford Guys, MD;  [facility];  Service: Ophthalmology;  Laterality: Right;  CDE:9.46  . CATARACT EXTRACTION W/PHACO Left 03/22/2015   Procedure: CATARACT EXTRACTION PHACO AND INTRAOCULAR LENS PLACEMENT (IOC);  Surgeon: Rutherford Guys, MD;  [facility]: [facility];  Service: Ophthalmology;  Laterality: Left;  CDE:5.80  .  COLONOSCOPY  08/22/09   Fields-(Tubular Adenoma)3-mm transverse polyp/4-mm polyp otherwise noraml/small internal hemorrhoids  . COLONOSCOPY [name] 04/14/2014   QIH:KVQQV internal hemorrhids/normal mocsa in the terminal iluem/left colonis redundant  . COLONOSCOPY W/ POLYPECTOMY  2011  . ENDARTERECTOMY FEMORAL Left 08/16/2017   Procedure: ENDARTERECTOMY LEFT PROFUNDA FEMORAL;  Surgeon: [name], MD;  Location: [place];  Service: Vascular;  Laterality: Left;  . ENDARTERECTOMY FEMORAL Right 03/31/2020   Procedure: RIGHT FEMORAL ENDARTERECTOMY;  Surgeon: [name], MD;  Location: [place];  Service: Vascular;  Laterality: Right;  . EP IMPLANTABLE DEVICE [name] 08/19/2015   Procedure: BiV ICD Insertion CRT-D;  Surgeon: Evans Lance, MD;  Location: [place] CV LAB;  Service: Cardiovascular;  Laterality: [name];  . ESOPHAGOGASTRODUODENOSCOPY [name] 03/05/2014   SLF: 1. Stricture  at the gastroesophagael junction 2. Small hiatal hernia 3. Moderate non-erosive gastritis and duodentitis. 4. No surce for Melena identified.   . FEMORAL-POPLITEAL BYPASS GRAFT Left 08/16/2017   Procedure: LEFT  FEMORAL-POPLITEAL ARTERY  BYPASS GRAFT;  Surgeon: [name], MD;  Location: [place];  Service: Vascular;  Laterality: Left;  . GIVENS CAPSULE STUDY [name] 03/30/2014   Procedure: GIVENS CAPSULE STUDY;  Surgeon: Danie Binder, MD;  [facility];  Service: Endoscopy;  Laterality: [name];  7:30  . GROIN DEBRIDEMENT Right 04/07/2020   Procedure: INCISION AND DRAINAGE OF RIGHT GROIN;  Surgeon: [name], MD;  [facility];  Service: Vascular;  Laterality: Right;  . I & D EXTREMITY Left 09/03/2017   Procedure: IRRIGATION AND DEBRIDEMENT EXTREMITY LEFT THIGH SEROMA;  Surgeon: Conrad Meadowlands, MD;  Location: [place];  Service: Vascular;  Laterality: Left;  . INSERT / REPLACE / REMOVE PACEMAKER    . INSERTION OF ILIAC STENT  03/31/2020   Procedure: INSERTION OF RIGHT ILIAC ARTERY STENT AND RIGHT SUPERFICIAL FEMORAL ARTERY  STENT;  Surgeon: [name], MD;  [facility];  Service: Vascular;;  . IR ANGIO INTRA EXTRACRAN SEL COM CAROTID INNOMINATE BILAT MOD SED  07/16/2017  . IR ANGIO INTRA EXTRACRAN SEL COM CAROTID INNOMINATE BILAT MOD SED  06/12/2019  . IR ANGIO VERTEBRAL SEL SUBCLAVIAN INNOMINATE BILAT MOD SED  07/16/2017  . IR ANGIO VERTEBRAL SEL SUBCLAVIAN INNOMINATE BILAT MOD SED  06/12/2019  . IR RADIOLOGIST EVAL & MGMT  04/01/2017  . IR RADIOLOGIST EVAL & MGMT  08/02/2017  . IR TRANSCATH EXCRAN VERT OR CAR A STENT  07/22/2019  . IR US GUIDE VASC ACCESS RIGHT  06/12/2019  . JOINT REPLACEMENT Right    Total Shoulder Replacement   . LOWER EXTREMITY ANGIOGRAPHY Bilateral 06/25/2017   Procedure: Lower Extremity Angiography;  Surgeon: [name], MD;  Location: [place] CV LAB;  Service: Cardiovascular;  Laterality: Bilateral;  . LUMBAR FUSION  2010  . PATCH ANGIOPLASTY Right 03/31/2020   Procedure: PATCH ANGIOPLASTY USING XENOSURE BIOLOGIC PATCH 1cm x 14cm;  Surgeon: [name], MD;  Location: [facility] OR;  Service: Vascular;  Laterality: Right;  . PERIPHERAL VASCULAR ATHERECTOMY Right 04/22/2018   Procedure: PERIPHERAL VASCULAR ATHERECTOMY;  Surgeon: [name], MD;  Location: [place] CV LAB;  Service: Cardiovascular;  Laterality: Right;  right superficial femoral  . PERIPHERAL VASCULAR BALLOON ANGIOPLASTY Right 04/22/2018   Procedure: PERIPHERAL VASCULAR BALLOON ANGIOPLASTY;  Surgeon: [name], MD;  Location: [place] CV LAB;  Service: Cardiovascular;  Laterality: Right;  external iliac  . PERIPHERAL VASCULAR BALLOON ANGIOPLASTY Left 03/08/2020   Procedure: PERIPHERAL VASCULAR BALLOON ANGIOPLASTY;  Surgeon: [name], MD;  Location: [place] CV LAB;  Service: Cardiovascular;  Laterality: Left;  Common Femoral  . PERIPHERAL VASCULAR BALLOON ANGIOPLASTY Left 08/23/2020   Procedure: PERIPHERAL VASCULAR BALLOON ANGIOPLASTY;  Surgeon: [name], MD;  Location: [place] CV  LAB;  Service: Cardiovascular;  Laterality: Left;  common femoral  . PERIPHERAL VASCULAR INTERVENTION Right 03/08/2020   Procedure: PERIPHERAL VASCULAR INTERVENTION;  Surgeon: [name], MD;  Location: [place] CV LAB;  Service: Cardiovascular;  Laterality: Right;  SFA  . PERIPHERAL VASCULAR INTERVENTION Right 08/23/2020   Procedure: PERIPHERAL VASCULAR INTERVENTION;  Surgeon: [name], MD;  Location: [place] CV LAB;  Service: Cardiovascular;  Laterality: Right;  external iliac  . RADIOLOGY WITH ANESTHESIA [name] 07/01/2019   Procedure: STENTING;  Surgeon: Luanne Bras, MD;  Location: [place];  Service: Radiology;  Laterality: [name];  . RADIOLOGY WITH ANESTHESIA [name] 07/22/2019   Procedure: STENTING;  Surgeon: Luanne Bras, MD;  Location: [place];  Service: Radiology;  Laterality: [name];  . TOTAL SHOULDER ARTHROPLASTY Right 2011   Dr. Tamera Punt  . ULNAR NERVE TRANSPOSITION Left 02/02/2016   Procedure: LEFT IN-SITU DECOMPRESSION ULNAR NERVE ;  Surgeon: Daryll Brod, MD;  Location: [place];  Service: Orthopedics;  Laterality: Left;  . ULNAR TUNNEL RELEASE Left 02/02/2016   Procedure: LEFT CUBITAL TUNNEL RELEASE;  Surgeon: Daryll Brod, MD;  Location: [place];  Service: Orthopedics;  Laterality: Left;  [name] VASECTOMY  1971  . VEIN HARVEST Left 08/16/2017   Procedure: USING NON REVERSE LEFT GREATER SAPHENOUS VEIN HARVEST;  Surgeon: [name], MD;  [facility];  Service: Vascular;  Laterality: Left;     Family History  Problem Relation Age of Onset  . Coronary artery disease Mother   . Diabetes Mother   . Heart disease Mother        Before age 61 - 62 Bypasses  . Hypertension Mother   . Heart attack Mother        3-4 Heart attacks  . Alzheimer's disease Father   . Diabetes Father   . Heart attack Sister   . Cancer Brother        Prostate  . Hyperlipidemia Son   . Prostate cancer Brother   . Alzheimer's disease Sister   . Colon cancer  Neg Hx      Social History   Socioeconomic History  . Marital status: Married    Spouse name: Not on file  . Number of children: 2  . Years of education: Not on file  . Highest education level: Not on file  Occupational History  . Occupation: retired, Clinical biochemist    Comment: Programmer, systems: RETIRED  Tobacco Use  . Smoking status: Current Every Day Smoker    Packs/day: 1.00    Years: 60.00    Pack years: 60.00    Types: Cigarettes    Start date: 08/20/1957  . Smokeless tobacco: Never Used  Vaping Use  . Vaping Use: Former  . Start date: 09/10/2016  . Quit date: 10/11/2016  Substance and Sexual Activity  . Alcohol use: No    Alcohol/week: 0.0 standard drinks    Comment: quit in July 2015  . Drug use: No  . Sexual activity: Not Currently  Other Topics Concern  . Not on file  Social History Narrative   Accompanied by daughter [name]   Lives w/ wife, daughter   Social Determinants of Health   Financial Resource Strain: Not on file  Food Insecurity: Not on file  Transportation Needs: Not on file  Physical Activity: Not on file  Stress: Not on file  Social Connections: Not on file  Intimate Partner Violence: Not on file     BP 140/62   Pulse 71   Ht 5\' 3"  (1.6 m)   Wt 150 lb 12.8 oz (68.4 kg)   SpO2 100%   BMI 26.71 kg/m   Physical Exam:  Well appearing NAD HEENT: Unremarkable Neck:  No JVD, no thyromegally Lymphatics:  No adenopathy Back:  No CVA tenderness Lungs:  Clear with no wheezes HEART:  Regular rate rhythm, no murmurs, no rubs, no clicks Abd:  soft, positive bowel sounds, no organomegally, no rebound, no guarding Ext:  2 plus pulses,  no edema, no cyanosis, no clubbing Skin:  No rashes no nodules Neuro:  CN II through XII intact, motor grossly intact  DEVICE  Normal device function.  See PaceArt for details. Still some noise but minimal.  Assess/Plan: 1. ICD lead noise - he has been stable over the past 3 months and I will  have him return in 6 months for additional followup. He has a magnet that he is instructed to use if he were to receive a shock due to lead noise. 2. Chronic systolic heart failure - his symptoms are class 2. No change. 3. CAD - he has not had any anginal symptoms.  Carleene Overlie Tabbitha Janvrin,MD

## 2020-11-11 DIAGNOSIS — E782 Mixed hyperlipidemia: Secondary | ICD-10-CM | POA: Diagnosis not present

## 2020-11-11 DIAGNOSIS — F5101 Primary insomnia: Secondary | ICD-10-CM | POA: Diagnosis not present

## 2020-11-11 DIAGNOSIS — I5042 Chronic combined systolic (congestive) and diastolic (congestive) heart failure: Secondary | ICD-10-CM | POA: Diagnosis not present

## 2020-11-11 DIAGNOSIS — I10 Essential (primary) hypertension: Secondary | ICD-10-CM | POA: Diagnosis not present

## 2020-11-11 DIAGNOSIS — I739 Peripheral vascular disease, unspecified: Secondary | ICD-10-CM | POA: Diagnosis not present

## 2020-11-11 DIAGNOSIS — D509 Iron deficiency anemia, unspecified: Secondary | ICD-10-CM | POA: Diagnosis not present

## 2020-11-11 DIAGNOSIS — I5022 Chronic systolic (congestive) heart failure: Secondary | ICD-10-CM | POA: Diagnosis not present

## 2020-11-11 DIAGNOSIS — J449 Chronic obstructive pulmonary disease, unspecified: Secondary | ICD-10-CM | POA: Diagnosis not present

## 2020-11-11 DIAGNOSIS — I679 Cerebrovascular disease, unspecified: Secondary | ICD-10-CM | POA: Diagnosis not present

## 2020-11-11 DIAGNOSIS — N401 Enlarged prostate with lower urinary tract symptoms: Secondary | ICD-10-CM | POA: Diagnosis not present

## 2020-11-11 DIAGNOSIS — F519 Sleep disorder not due to a substance or known physiological condition, unspecified: Secondary | ICD-10-CM | POA: Diagnosis not present

## 2020-11-11 DIAGNOSIS — K7030 Alcoholic cirrhosis of liver without ascites: Secondary | ICD-10-CM | POA: Diagnosis not present

## 2020-11-15 DIAGNOSIS — K7030 Alcoholic cirrhosis of liver without ascites: Secondary | ICD-10-CM | POA: Diagnosis not present

## 2020-11-15 DIAGNOSIS — R7301 Impaired fasting glucose: Secondary | ICD-10-CM | POA: Diagnosis not present

## 2020-11-15 DIAGNOSIS — E875 Hyperkalemia: Secondary | ICD-10-CM | POA: Diagnosis not present

## 2020-11-15 DIAGNOSIS — I10 Essential (primary) hypertension: Secondary | ICD-10-CM | POA: Diagnosis not present

## 2020-11-15 DIAGNOSIS — E782 Mixed hyperlipidemia: Secondary | ICD-10-CM | POA: Diagnosis not present

## 2020-11-15 DIAGNOSIS — R600 Localized edema: Secondary | ICD-10-CM | POA: Diagnosis not present

## 2020-11-15 DIAGNOSIS — G8929 Other chronic pain: Secondary | ICD-10-CM | POA: Diagnosis not present

## 2020-11-15 DIAGNOSIS — Z96611 Presence of right artificial shoulder joint: Secondary | ICD-10-CM | POA: Diagnosis not present

## 2020-11-15 DIAGNOSIS — I739 Peripheral vascular disease, unspecified: Secondary | ICD-10-CM | POA: Diagnosis not present

## 2020-11-15 DIAGNOSIS — I5022 Chronic systolic (congestive) heart failure: Secondary | ICD-10-CM | POA: Diagnosis not present

## 2020-11-15 DIAGNOSIS — D509 Iron deficiency anemia, unspecified: Secondary | ICD-10-CM | POA: Diagnosis not present

## 2020-11-15 DIAGNOSIS — Z720 Tobacco use: Secondary | ICD-10-CM | POA: Diagnosis not present

## 2020-12-21 ENCOUNTER — Other Ambulatory Visit: Payer: Self-pay

## 2020-12-21 DIAGNOSIS — I6522 Occlusion and stenosis of left carotid artery: Secondary | ICD-10-CM

## 2021-01-04 ENCOUNTER — Ambulatory Visit (INDEPENDENT_AMBULATORY_CARE_PROVIDER_SITE_OTHER): Payer: Medicare Other

## 2021-01-04 DIAGNOSIS — I255 Ischemic cardiomyopathy: Secondary | ICD-10-CM | POA: Diagnosis not present

## 2021-01-04 LAB — CUP PACEART REMOTE DEVICE CHECK
Battery Remaining Longevity: 25 mo
Battery Remaining Percentage: 31 %
Battery Voltage: 2.89 V
Brady Statistic AP VP Percent: 31 %
Brady Statistic AP VS Percent: 1 %
Brady Statistic AS VP Percent: 68 %
Brady Statistic AS VS Percent: 1 %
Brady Statistic RA Percent Paced: 30 %
Date Time Interrogation Session: 20220209034539
HighPow Impedance: 82 Ohm
HighPow Impedance: 82 Ohm
Implantable Lead Implant Date: 20160923
Implantable Lead Implant Date: 20160923
Implantable Lead Implant Date: 20160923
Implantable Lead Location: 753858
Implantable Lead Location: 753859
Implantable Lead Location: 753860
Implantable Lead Model: 7122
Implantable Pulse Generator Implant Date: 20160923
Lead Channel Impedance Value: 410 Ohm
Lead Channel Impedance Value: 460 Ohm
Lead Channel Impedance Value: 760 Ohm
Lead Channel Pacing Threshold Amplitude: 0.75 V
Lead Channel Pacing Threshold Amplitude: 0.75 V
Lead Channel Pacing Threshold Amplitude: 1.5 V
Lead Channel Pacing Threshold Pulse Width: 0.5 ms
Lead Channel Pacing Threshold Pulse Width: 0.5 ms
Lead Channel Pacing Threshold Pulse Width: 0.8 ms
Lead Channel Sensing Intrinsic Amplitude: 12 mV
Lead Channel Sensing Intrinsic Amplitude: 3.5 mV
Lead Channel Setting Pacing Amplitude: 2 V
Lead Channel Setting Pacing Amplitude: 2 V
Lead Channel Setting Pacing Amplitude: 2.5 V
Lead Channel Setting Pacing Pulse Width: 0.5 ms
Lead Channel Setting Pacing Pulse Width: 0.8 ms
Lead Channel Setting Sensing Sensitivity: 1 mV
Pulse Gen Serial Number: 7294918

## 2021-01-06 ENCOUNTER — Other Ambulatory Visit (HOSPITAL_COMMUNITY): Payer: Self-pay | Admitting: Interventional Radiology

## 2021-01-06 ENCOUNTER — Telehealth (HOSPITAL_COMMUNITY): Payer: Self-pay | Admitting: Radiology

## 2021-01-06 DIAGNOSIS — I771 Stricture of artery: Secondary | ICD-10-CM

## 2021-01-06 DIAGNOSIS — I671 Cerebral aneurysm, nonruptured: Secondary | ICD-10-CM

## 2021-01-06 NOTE — Telephone Encounter (Signed)
Called pt to schedule angio with Deveshwar. No answer, left VM. JM 

## 2021-01-09 ENCOUNTER — Ambulatory Visit (INDEPENDENT_AMBULATORY_CARE_PROVIDER_SITE_OTHER): Payer: Medicare Other | Admitting: Surgery

## 2021-01-09 ENCOUNTER — Other Ambulatory Visit: Payer: Self-pay

## 2021-01-09 ENCOUNTER — Ambulatory Visit (HOSPITAL_COMMUNITY)
Admission: RE | Admit: 2021-01-09 | Discharge: 2021-01-09 | Disposition: A | Discharge status: Home / Self Care | Payer: Medicare Other | Source: Ambulatory Visit | Attending: Surgery | Admitting: Surgery

## 2021-01-09 ENCOUNTER — Encounter: Payer: Self-pay | Admitting: Surgery

## 2021-01-09 VITALS — BP 148/75 | HR 63 | Temp 97.8°F | Resp 20 | Ht 63.0 in | Wt 150.4 lb

## 2021-01-09 DIAGNOSIS — I255 Ischemic cardiomyopathy: Secondary | ICD-10-CM

## 2021-01-09 DIAGNOSIS — I70213 Atherosclerosis of native arteries of extremities with intermittent claudication, bilateral legs: Secondary | ICD-10-CM

## 2021-01-09 DIAGNOSIS — I6522 Occlusion and stenosis of left carotid artery: Secondary | ICD-10-CM | POA: Diagnosis not present

## 2021-01-09 NOTE — Progress Notes (Signed)
[facility] [place]  Patient name: Roy [name] MRN: [id number] DOB: 1946-11-17 Sex: male   REASON FOR VISIT:    Follow up  HISOTRY OF PRESENT ILLNESS:   Roy [name] a 75 y.o.malewho is status post right iliofemoral endarterectomy with patch angioplasty as well as common iliac and superficial femoral artery stenting. This was performed on 03/31/2020. He was seen in the office on 04/06/2020 with redness from his incision. 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 This was treated with antibiotic beads and a wound VAC. Cultures were negative.   He has undergone the following procedures:  2015:  Bilateral iliac stenting (claudication) 08-16-2017: Left femoral to above-knee popliteal bypass graft with ipsilateral saphenous vein 09/03/2017: I&D of a distal incision fluid collection 04/22/2018: Right superficial femoral artery atherectomy and drug-coated balloon angioplasty.  Angioplasty of right external iliac artery 03/08/2020: Right superficial femoral artery stent, drug-coated balloon angioplasty left common femoral artery 03/31/2020: Right iliofemoral endarterectomy with bovine patch angioplasty and right common iliac stent, right superficial femoral artery stent 04/07/2020: I&D of right groin seroma 08/23/2020: Drug-coated balloon angioplasty, left common femoral artery.  Stent right external iliac artery  He states that his legs feel great.  He is not having claudication symptoms.  He continues to smoke.  He is on dual antiplatelet therapy and a statin   PAST MEDICAL HISTORY:   Past Medical History:  Diagnosis Date  . AICD (automatic cardioverter/defibrillator) present 08/19/2015   [employer] BiV ICD for primary prevention by Dr. Lovena Le  . Alcohol abuse    6 beers per day; hospital admission in 2009 for withdrawal symptoms  . Alcoholic cirrhosis ([place])   . Anxiety and depression     denies   . Cerebrovascular disease 2009   TIA; 2009- right ICA stent; re-intervention for restenosis complicated by [facility] w/o sx  . CHF (congestive heart failure) ([place]) 06-02-14  . Chronic obstructive pulmonary disease ([place])   . Degenerative joint disease    Total shoulder arthroplasty-right  . Hyperlipidemia   . Hypertension   . LBBB (left bundle branch block)    Normal echo-2011; stress nuclear in 09/2010--septal hypoperfusion representing nontransmural infarction or the effect of left bundle branch block, no ischemia  . Myocardial infarction [facility]) June 02, 2014   Massive Heart Attack  . Peripheral vascular disease ([place])   . 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  . Thrombocytopenia ([place])   . Tobacco abuse    -100 pack years; 1.5 packs per day  . Traumatic seroma of thigh ([place])    left  . Tubular adenoma of colon      FAMILY HISTORY:   Family History  Problem Relation Age of Onset  . Coronary artery disease Mother   . Diabetes Mother   . Heart disease Mother        Before age 49 - 84 Bypasses  . Hypertension Mother   . Heart attack Mother        3-4 Heart attacks  . Alzheimer's disease Father   . Diabetes Father   . Heart attack Sister   . Cancer Brother        Prostate  . Hyperlipidemia Son   . Prostate cancer Brother   . Alzheimer's disease Sister   . Colon cancer Neg Hx     SOCIAL HISTORY:   Social History   Tobacco Use  . Smoking status: Current Every Day Smoker    Packs/day:  1.00    Years: 60.00    Pack years: 60.00    Types: Cigarettes    Start date: 08/20/1957  . Smokeless tobacco: Never Used  Substance Use Topics  . Alcohol use: No    Alcohol/week: 0.0 standard drinks    Comment: quit in July 2015     ALLERGIES:   No Known Allergies   CURRENT MEDICATIONS:   Current Outpatient Medications  Medication Sig Dispense Refill  . acetaminophen (TYLENOL) 500 MG tablet Take 1,000 mg by mouth as needed for moderate pain or headache.      [name] aspirin EC 81 MG tablet Take 81 mg by mouth every evening.     [name] atorvastatin (LIPITOR) 20 MG tablet Take 20 mg by mouth daily.     [name] BRILINTA 90 MG TABS tablet Take 90 mg by mouth 2 (two) times daily.    . carvedilol (COREG) 3.125 MG tablet TAKE 1 TABLET TWICE A DAY (Patient taking differently: Take 3.125 mg by mouth 2 (two) times daily.) 180 tablet 0  . dextromethorphan-guaiFENesin (MUCINEX DM) 30-600 MG 12hr tablet Take 1 tablet by mouth daily.     [name] ENTRESTO 49-51 MG TAKE 1 TABLET TWICE A DAY (Patient taking differently: Take 1 tablet by mouth in the morning and at bedtime.) 180 tablet 4  . gabapentin (NEURONTIN) 300 MG capsule Take 300 mg by mouth 2 (two) times daily.     . magnesium oxide (MAG-OX) 400 MG tablet Take 400 mg by mouth daily.    . Multiple Vitamins-Minerals (CENTRUM SILVER PO) Take 1 tablet by mouth daily. 50+    . Omega-3 Fatty Acids (FISH OIL) 1000 MG CAPS Take 1,000 mg by mouth daily.    [name] oxyCODONE-acetaminophen (PERCOCET/ROXICET) 5-325 MG tablet Take 1 tablet by mouth daily as needed for moderate pain.     . SUPER B COMPLEX/C PO Take 1 tablet by mouth at bedtime.     . tamsulosin (FLOMAX) 0.4 MG CAPS capsule Take 0.4 mg by mouth 2 (two) times daily.     . traMADol (ULTRAM) 50 MG tablet Take 50 mg by mouth daily as needed for moderate pain.      No current facility-administered medications for this visit.    REVIEW OF SYSTEMS:   [X]  denotes positive finding, [ ]  denotes negative finding Cardiac  Comments:  Chest pain or chest pressure:    Shortness of breath upon exertion:    Short of breath when lying flat:    Irregular heart rhythm:        Vascular    Pain in calf, thigh, or hip brought on by ambulation:    Pain in feet at night that wakes you up from your sleep:     Blood clot in your veins:    Leg swelling:         Pulmonary    Oxygen at home:    Productive cough:     Wheezing:         Neurologic    Sudden weakness in arms or legs:     Sudden  numbness in arms or legs:     Sudden onset of difficulty speaking or slurred speech:    Temporary loss of vision in one eye:     Problems with dizziness:         Gastrointestinal    Blood in stool:     Vomited blood:         Genitourinary    Burning when urinating:  Blood in urine:        Psychiatric    Major depression:         Hematologic    Bleeding problems:    Problems with blood clotting too easily:        Skin    Rashes or ulcers:        Constitutional    Fever or chills:      PHYSICAL EXAM:   Vitals:   01/09/21 1439 01/09/21 1440  BP: (!) 155/76 (!) 148/75  Pulse: 63   Resp: 20   Temp: 97.8 F (36.6 C)   SpO2: 94%   Weight: 150 lb 6.4 oz (68.2 kg)   Height: 5\' 3"  (1.6 m)     GENERAL: The patient is a well-nourished male, in no acute distress. The vital signs are documented above. CARDIAC: There is a regular rate and rhythm.  VASCULAR: Palpable dorsalis pedis pulses PULMONARY: Non-labored respirations ABDOMEN: Soft and non-tender with normal pitched bowel sounds.  MUSCULOSKELETAL: There are no major deformities or cyanosis. NEUROLOGIC: No focal weakness or paresthesias are detected. SKIN: There are no ulcers or rashes noted. PSYCHIATRIC: The patient has a normal affect.  STUDIES:   I have reviewed the following: Carotid: Right Carotid: Velocities in the right ICA are consistent with a 1-39%  stenosis.   Left Carotid: Velocities in the left ICA are consistent with a 1-39%  stenosis.  MEDICAL ISSUES:   Carotid:  Angio by Dr. Estanislado Pandy showed 70% left ICA stenosis.  On 07/14/2020 CTA Showed 70%left ICA stenosis and 50% right ICA stenosis.  This does not appear to be reflected on ultrasound.  I will reach out to Dr. Estanislado Pandy to make sure the patient has adequate follow-up  Lower extremity: Patient has palpable pedal pulses.  He will need repeat studies in 6 months.    Leia Alf, MD, FACS [facility]  [facility] [phone] Pager [phone]

## 2021-01-09 NOTE — Progress Notes (Signed)
Remote ICD transmission.   

## 2021-01-10 ENCOUNTER — Other Ambulatory Visit: Payer: Self-pay

## 2021-01-10 DIAGNOSIS — I70213 Atherosclerosis of native arteries of extremities with intermittent claudication, bilateral legs: Secondary | ICD-10-CM

## 2021-01-11 ENCOUNTER — Telehealth (HOSPITAL_COMMUNITY): Payer: Self-pay | Admitting: Radiology

## 2021-01-11 NOTE — Telephone Encounter (Signed)
2nd attempt to reach patient to schedule angiogram follow-up with Deveshwar. No answer, left VM. JM

## 2021-01-16 ENCOUNTER — Other Ambulatory Visit: Payer: Self-pay | Admitting: Student

## 2021-01-17 ENCOUNTER — Other Ambulatory Visit (HOSPITAL_COMMUNITY): Payer: Self-pay | Admitting: Interventional Radiology

## 2021-01-17 ENCOUNTER — Other Ambulatory Visit: Payer: Self-pay

## 2021-01-17 ENCOUNTER — Encounter (HOSPITAL_COMMUNITY): Payer: Self-pay

## 2021-01-17 ENCOUNTER — Ambulatory Visit (HOSPITAL_COMMUNITY)
Admission: RE | Admit: 2021-01-17 | Discharge: 2021-01-17 | Disposition: A | Discharge status: Home / Self Care | Payer: Medicare Other | Source: Ambulatory Visit | Attending: Interventional Radiology | Admitting: Interventional Radiology

## 2021-01-17 DIAGNOSIS — Z79899 Other long term (current) drug therapy: Secondary | ICD-10-CM | POA: Insufficient documentation

## 2021-01-17 DIAGNOSIS — I671 Cerebral aneurysm, nonruptured: Secondary | ICD-10-CM

## 2021-01-17 DIAGNOSIS — F17210 Nicotine dependence, cigarettes, uncomplicated: Secondary | ICD-10-CM | POA: Insufficient documentation

## 2021-01-17 DIAGNOSIS — I6521 Occlusion and stenosis of right carotid artery: Secondary | ICD-10-CM | POA: Insufficient documentation

## 2021-01-17 DIAGNOSIS — I6523 Occlusion and stenosis of bilateral carotid arteries: Secondary | ICD-10-CM | POA: Diagnosis not present

## 2021-01-17 DIAGNOSIS — Z7982 Long term (current) use of aspirin: Secondary | ICD-10-CM | POA: Insufficient documentation

## 2021-01-17 DIAGNOSIS — I771 Stricture of artery: Secondary | ICD-10-CM

## 2021-01-17 DIAGNOSIS — Z95820 Peripheral vascular angioplasty status with implants and grafts: Secondary | ICD-10-CM | POA: Diagnosis not present

## 2021-01-17 DIAGNOSIS — I6503 Occlusion and stenosis of bilateral vertebral arteries: Secondary | ICD-10-CM | POA: Diagnosis not present

## 2021-01-17 DIAGNOSIS — I6502 Occlusion and stenosis of left vertebral artery: Secondary | ICD-10-CM | POA: Diagnosis not present

## 2021-01-17 HISTORY — PX: IR US GUIDE VASC ACCESS RIGHT: IMG2390

## 2021-01-17 HISTORY — PX: IR ANGIO VERTEBRAL SEL SUBCLAVIAN INNOMINATE BILAT MOD SED: IMG5366

## 2021-01-17 HISTORY — PX: IR ANGIO INTRA EXTRACRAN SEL COM CAROTID INNOMINATE BILAT MOD SED: IMG5360

## 2021-01-17 LAB — CBC
HCT: 42.6 % (ref 39.0–52.0)
Hemoglobin: 15 g/dL (ref 13.0–17.0)
MCH: 31.8 pg (ref 26.0–34.0)
MCHC: 35.2 g/dL (ref 30.0–36.0)
MCV: 90.3 fL (ref 80.0–100.0)
Platelets: 276 10*3/uL (ref 150–400)
RBC: 4.72 MIL/uL (ref 4.22–5.81)
RDW: 12.2 % (ref 11.5–15.5)
WBC: 7.9 10*3/uL (ref 4.0–10.5)
nRBC: 0 % (ref 0.0–0.2)

## 2021-01-17 LAB — PROTIME-INR
INR: 1 (ref 0.8–1.2)
Prothrombin Time: 13.2 seconds (ref 11.4–15.2)

## 2021-01-17 LAB — BASIC METABOLIC PANEL
Anion gap: 10 (ref 5–15)
BUN: 11 mg/dL (ref 8–23)
CO2: 22 mmol/L (ref 22–32)
Calcium: 9.5 mg/dL (ref 8.9–10.3)
Chloride: 103 mmol/L (ref 98–111)
Creatinine, Ser: 0.65 mg/dL (ref 0.61–1.24)
GFR, Estimated: >60 mL/min (ref >60)
Glucose, Bld: 115 mg/dL — ABNORMAL HIGH (ref 70–99)
Potassium: 4.7 mmol/L (ref 3.5–5.1)
Sodium: 135 mmol/L (ref 135–145)

## 2021-01-17 MED ORDER — VERAPAMIL HCL 2.5 MG/ML IV SOLN: INTRA_ARTERIAL | Status: AC | PRN

## 2021-01-17 MED ORDER — LIDOCAINE HCL (PF) 1 % IJ SOLN
INTRAMUSCULAR | Status: AC | PRN
Start: 2021-01-17 — End: 2021-01-17
  Administered 2021-01-17: 5 mL

## 2021-01-17 MED ORDER — HEPARIN SODIUM (PORCINE) 1000 UNIT/ML IJ SOLN
INTRAMUSCULAR | Status: AC | PRN
  Administered 2021-01-17: 1000 [IU] via INTRA_ARTERIAL
  Administered 2021-01-17: 2000 [IU] via INTRA_ARTERIAL

## 2021-01-17 MED ORDER — FENTANYL CITRATE (PF) 100 MCG/2ML IJ SOLN
INTRAMUSCULAR | Status: AC
  Filled 2021-01-17: qty 2

## 2021-01-17 MED ORDER — NITROGLYCERIN 1 MG/10 ML FOR IR/CATH LAB
INTRA_ARTERIAL | Status: AC
  Filled 2021-01-17: qty 10

## 2021-01-17 MED ORDER — NITROGLYCERIN 1 MG/10 ML FOR IR/CATH LAB
INTRA_ARTERIAL | Status: AC | PRN
  Administered 2021-01-17: 100 ug via INTRA_ARTERIAL
  Administered 2021-01-17: 200 ug via INTRA_ARTERIAL

## 2021-01-17 MED ORDER — IOHEXOL 300 MG/ML  SOLN
150.0000 mL | Freq: Once | INTRAMUSCULAR | Status: AC | PRN
  Administered 2021-01-17: 70 mL via INTRA_ARTERIAL

## 2021-01-17 MED ORDER — MIDAZOLAM HCL 2 MG/2ML IJ SOLN
INTRAMUSCULAR | Status: AC
  Filled 2021-01-17: qty 2

## 2021-01-17 MED ORDER — SODIUM CHLORIDE 0.9 % IV SOLN: INTRAVENOUS | Status: DC

## 2021-01-17 MED ORDER — VERAPAMIL HCL 2.5 MG/ML IV SOLN
INTRAVENOUS | Status: AC
  Filled 2021-01-17: qty 2

## 2021-01-17 MED ORDER — IOHEXOL 300 MG/ML  SOLN
50.0000 mL | Freq: Once | INTRAMUSCULAR | Status: AC | PRN
  Administered 2021-01-17: 6 mL via INTRA_ARTERIAL

## 2021-01-17 MED ORDER — LIDOCAINE HCL 1 % IJ SOLN
INTRAMUSCULAR | Status: AC
  Filled 2021-01-17: qty 20

## 2021-01-17 MED ORDER — SODIUM CHLORIDE 0.9 % IV SOLN: INTRAVENOUS | Status: AC

## 2021-01-17 MED ORDER — MIDAZOLAM HCL 2 MG/2ML IJ SOLN
INTRAMUSCULAR | Status: AC | PRN
  Administered 2021-01-17: 1 mg via INTRAVENOUS

## 2021-01-17 MED ORDER — HEPARIN SODIUM (PORCINE) 1000 UNIT/ML IJ SOLN
INTRAMUSCULAR | Status: AC
  Filled 2021-01-17: qty 1

## 2021-01-17 MED ORDER — FENTANYL CITRATE (PF) 100 MCG/2ML IJ SOLN
INTRAMUSCULAR | Status: AC | PRN
  Administered 2021-01-17: 25 ug via INTRAVENOUS

## 2021-01-17 NOTE — Progress Notes (Signed)
NIR.  Patient underwent an image-guided diagnostic cerebral arteriogram via right radial approach this AM by Dr. Estanislado Pandy. Following procedure, patient was sent to short stay in [facility] (right radial puncture site stable with TR band in place) for recovery prior to discharge.  Received call/page from short stay Jeannene Patella, RN) requesting evaluation of patient due to pain of RUE. PA to assess bedside.  Patient awake and alert sitting in chair watching TV. Patient states pain is located near elbow (brachioradialis insertion site), denies numbness/tingling of that area. States "I think it is from inactivity" and "it feels better when I move it"- states "pain" has significantly improved with movement. Right radial puncture site soft with dressing intact, radial pulse 2+ bilaterally, ROM of RUE intact. Brachioradialis muscle full, grossly symmetric bilaterally.  Dr. Estanislado Pandy made aware- no further IR interventions/recommendations at this time. Patient stable for discharge. Please call NIR with questions/concerns.   Bea Graff Opha Mcghee, PA-C 01/17/2021, 12:59 PM

## 2021-01-17 NOTE — Progress Notes (Signed)
Pt states arm "hurts like hell"  Radiology PA Allie notified. Came to assess pt and states it is fine.

## 2021-01-17 NOTE — H&P (Signed)
Chief Complaint: Patient was seen in consultation today for cerebral arteriogram    Supervising Physician: Luanne Bras  Patient Status: [facility] - Out-pt  History of Present Illness: Roy [name] is a 75 y.o. male    Known to [facility] R ICA angioplasty stent 08/2008 Attempted revascularization left vertebral artery 06/2019  Follows with cardiology Dr Beckie Salts  -- Systolic heart failure with ICD insertion;  and Vasc surgery Dr [name]:  03/31/2020: Right iliofemoral endarterectomy with bovine patch angioplasty and right common iliac stent, right superficial femoral artery stent 04/07/2020: I&D of right groin seroma 08/23/2020: Drug-coated balloon angioplasty, left common femoral artery.  Stent right external iliac artery  Pt getting along well +smoker Denies dizziness; speech changes Denies N/V or headache Does have rare occasional change in vision that lasts few seconds and goes away Most recent cerebral imaging CTA 07/14/20: IMPRESSION: 1. Severely limited CTA due to bolus timing/quality. The left side was injected and there is left brachiocephalic stenosis from pacer wires. In the future, recommend right-sided injection. 2. Estimated 70% left proximal ICA stenosis. 3. Estimated 50% right proximal ICA stenosis. 4. Intracranial ICA stent on the right which is likely patent. There appears to be high-grade stenosis at the right M1 segment. 5. Estimated 60% left paraclinoid ICA stenosis. 6. The vertebral arteries are very poorly characterized due to limitations. Subjectively moderate proximal vertebral stenosis due to calcified plaque.  Scheduled now for follow up with Dr Estanislado Pandy Cerebral arteriogram today  Past Medical History:  Diagnosis Date  . AICD (automatic cardioverter/defibrillator) present 08/19/2015   St Jude BiV ICD for primary prevention by Dr. Lovena Le  . Alcohol abuse    6 beers per day; hospital admission in 2009 for withdrawal symptoms  . Alcoholic  cirrhosis ([place])   . Anxiety and depression    denies   . Cerebrovascular disease 2009   TIA; 2009- right ICA stent; re-intervention for restenosis complicated by [facility] w/o sx  . CHF (congestive heart failure) ([place]) 06-02-14  . Chronic obstructive pulmonary disease ([place])   . Degenerative joint disease    Total shoulder arthroplasty-right  . Hyperlipidemia   . Hypertension   . LBBB (left bundle branch block)    Normal echo-2011; stress nuclear in 09/2010--septal hypoperfusion representing nontransmural infarction or the effect of left bundle branch block, no ischemia  . Myocardial infarction [facility]) June 02, 2014   Massive Heart Attack  . Peripheral vascular disease ([place])   . 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  . Thrombocytopenia ([place])   . Tobacco abuse    -100 pack years; 1.5 packs per day  . Traumatic seroma of thigh ([place])    left  . Tubular adenoma of colon     Past Surgical History:  Procedure Laterality Date  . ABDOMINAL AORTAGRAM [name] 05/04/2014   Procedure: ABDOMINAL Maxcine Ham;  Surgeon: [name], MD;  Location: [facility] CATH LAB;  Service: Cardiovascular;  Laterality: [name];  . ABDOMINAL AORTOGRAM [name] 06/25/2017   Procedure: Abdominal Aortogram;  Surgeon: [name], MD;  Location: [place] CV LAB;  Service: Cardiovascular;  Laterality: [name];  . ABDOMINAL AORTOGRAM W/LOWER EXTREMITY [name] 04/22/2018   Procedure: ABDOMINAL AORTOGRAM W/LOWER EXTREMITY;  Surgeon: [name], MD;  Location: [place] CV LAB;  Service: Cardiovascular;  Laterality: [name];  . ABDOMINAL AORTOGRAM W/LOWER EXTREMITY [name] 03/08/2020   Procedure: ABDOMINAL AORTOGRAM W/LOWER EXTREMITY;  Surgeon: [name], MD;  Location: [place] CV LAB;  Service: Cardiovascular;  Laterality: [name];  . ABDOMINAL  AORTOGRAM W/LOWER EXTREMITY Bilateral 08/23/2020   Procedure: ABDOMINAL AORTOGRAM W/LOWER EXTREMITY;  Surgeon: [name], MD;  Location: [place] CV LAB;  Service: Cardiovascular;   Laterality: Bilateral;  . AGILE CAPSULE [name] 03/18/2014   Procedure: AGILE CAPSULE;  Surgeon: Danie Binder, MD;  [facility];  Service: Endoscopy;  Laterality: [name];  7:30  . APPLICATION OF WOUND VAC Left 09/03/2017   Procedure: APPLICATION OF WOUND VAC;  Surgeon: Conrad Walthourville, MD;  Location: [place];  Service: Vascular;  Laterality: Left;  . APPLICATION OF WOUND VAC Right 04/07/2020   Procedure: PLACEMENT OF ANTIBIOTIC BEADS AND APPLICATION OF WOUND VAC;  Surgeon: [name], MD;  Location: [place];  Service: Vascular;  Laterality: Right;  . BACK SURGERY    . BACTERIAL OVERGROWTH TEST [name] 05/24/2015   Procedure: BACTERIAL OVERGROWTH TEST;  Surgeon: Danie Binder, MD;  [facility];  Service: Endoscopy;  Laterality: [name];  0700  . BI-VENTRICULAR IMPLANTABLE CARDIOVERTER DEFIBRILLATOR  (CRT-D)  08/19/2015  . CARPAL TUNNEL RELEASE Left 02/02/2016   Procedure: LEFT CARPAL TUNNEL RELEASE;  Surgeon: Daryll Brod, MD;  Location: [place];  Service: Orthopedics;  Laterality: Left;  ANESTHESIA: IV REGIONAL UPPER ARM  . CATARACT EXTRACTION W/PHACO Right 03/08/2015   Procedure: CATARACT EXTRACTION PHACO AND INTRAOCULAR LENS PLACEMENT (IOC);  Surgeon: Rutherford Guys, MD;  Location: [facility] ORS;  Service: Ophthalmology;  Laterality: Right;  CDE:9.46  . CATARACT EXTRACTION W/PHACO Left 03/22/2015   Procedure: CATARACT EXTRACTION PHACO AND INTRAOCULAR LENS PLACEMENT (IOC);  Surgeon: Rutherford Guys, MD;  Location: [facility];  Service: Ophthalmology;  Laterality: Left;  CDE:5.80  . COLONOSCOPY  08/22/09   Fields-(Tubular Adenoma)3-mm transverse polyp/4-mm polyp otherwise noraml/small internal hemorrhoids  . COLONOSCOPY [name] 04/14/2014   EXB:MWUXL internal hemorrhids/normal mocsa in the terminal iluem/left colonis redundant  . COLONOSCOPY W/ POLYPECTOMY  2011  . ENDARTERECTOMY FEMORAL Left 08/16/2017   Procedure: ENDARTERECTOMY LEFT PROFUNDA FEMORAL;  Surgeon: [name], MD;  Location:  [place];  Service: Vascular;  Laterality: Left;  . ENDARTERECTOMY FEMORAL Right 03/31/2020   Procedure: RIGHT FEMORAL ENDARTERECTOMY;  Surgeon: [name], MD;  Location: [place];  Service: Vascular;  Laterality: Right;  . EP IMPLANTABLE DEVICE [name] 08/19/2015   Procedure: BiV ICD Insertion CRT-D;  Surgeon: Evans Lance, MD;  Location: [place] CV LAB;  Service: Cardiovascular;  Laterality: [name];  . ESOPHAGOGASTRODUODENOSCOPY [name] 03/05/2014   SLF: 1. Stricture at the gastroesophagael junction 2. Small hiatal hernia 3. Moderate non-erosive gastritis and duodentitis. 4. No surce for Melena identified.   . FEMORAL-POPLITEAL BYPASS GRAFT Left 08/16/2017   Procedure: LEFT  FEMORAL-POPLITEAL ARTERY  BYPASS GRAFT;  Surgeon: [name], MD;  Location: [place];  Service: Vascular;  Laterality: Left;  . GIVENS CAPSULE STUDY [name] 03/30/2014   Procedure: GIVENS CAPSULE STUDY;  Surgeon: Danie Binder, MD;  [facility];  Service: Endoscopy;  Laterality: [name];  7:30  . GROIN DEBRIDEMENT Right 04/07/2020   Procedure: INCISION AND DRAINAGE OF RIGHT GROIN;  Surgeon: [name], MD;  [facility] OR;  Service: Vascular;  Laterality: Right;  . I & D EXTREMITY Left 09/03/2017   Procedure: IRRIGATION AND DEBRIDEMENT EXTREMITY LEFT THIGH SEROMA;  Surgeon: Conrad Marietta, MD;  Location: [place];  Service: Vascular;  Laterality: Left;  . INSERT / REPLACE / REMOVE PACEMAKER    . INSERTION OF ILIAC STENT  03/31/2020   Procedure: INSERTION OF RIGHT ILIAC ARTERY  STENT AND RIGHT SUPERFICIAL FEMORAL ARTERY STENT;  Surgeon: [name], MD;  Location[facility] OR;  Service: Vascular;;  . IR ANGIO INTRA EXTRACRAN SEL COM CAROTID INNOMINATE BILAT MOD SED  07/16/2017  . IR ANGIO INTRA EXTRACRAN SEL COM CAROTID INNOMINATE BILAT MOD SED  06/12/2019  . IR ANGIO VERTEBRAL SEL SUBCLAVIAN INNOMINATE BILAT MOD SED  07/16/2017  . IR ANGIO VERTEBRAL SEL SUBCLAVIAN INNOMINATE BILAT MOD SED  06/12/2019  . IR RADIOLOGIST EVAL & MGMT   04/01/2017  . IR RADIOLOGIST EVAL & MGMT  08/02/2017  . IR TRANSCATH EXCRAN VERT OR CAR A STENT  07/22/2019  . IR US GUIDE VASC ACCESS RIGHT  06/12/2019  . JOINT REPLACEMENT Right    Total Shoulder Replacement   . LOWER EXTREMITY ANGIOGRAPHY Bilateral 06/25/2017   Procedure: Lower Extremity Angiography;  Surgeon: [name], MD;  Location: [place] CV LAB;  Service: Cardiovascular;  Laterality: Bilateral;  . LUMBAR FUSION  2010  . PATCH ANGIOPLASTY Right 03/31/2020   Procedure: PATCH ANGIOPLASTY USING XENOSURE BIOLOGIC PATCH 1cm x 14cm;  Surgeon: [name], MD;  Location: [facility] OR;  Service: Vascular;  Laterality: Right;  . PERIPHERAL VASCULAR ATHERECTOMY Right 04/22/2018   Procedure: PERIPHERAL VASCULAR ATHERECTOMY;  Surgeon: [name], MD;  Location: [place] CV LAB;  Service: Cardiovascular;  Laterality: Right;  right superficial femoral  . PERIPHERAL VASCULAR BALLOON ANGIOPLASTY Right 04/22/2018   Procedure: PERIPHERAL VASCULAR BALLOON ANGIOPLASTY;  Surgeon: [name], MD;  Location: [place] CV LAB;  Service: Cardiovascular;  Laterality: Right;  external iliac  . PERIPHERAL VASCULAR BALLOON ANGIOPLASTY Left 03/08/2020   Procedure: PERIPHERAL VASCULAR BALLOON ANGIOPLASTY;  Surgeon: [name], MD;  Location: [place] CV LAB;  Service: Cardiovascular;  Laterality: Left;  Common Femoral  . PERIPHERAL VASCULAR BALLOON ANGIOPLASTY Left 08/23/2020   Procedure: PERIPHERAL VASCULAR BALLOON ANGIOPLASTY;  Surgeon: [name], MD;  Location: [place] CV LAB;  Service: Cardiovascular;  Laterality: Left;  common femoral  . PERIPHERAL VASCULAR INTERVENTION Right 03/08/2020   Procedure: PERIPHERAL VASCULAR INTERVENTION;  Surgeon: [name], MD;  Location: [place] CV LAB;  Service: Cardiovascular;  Laterality: Right;  SFA  . PERIPHERAL VASCULAR INTERVENTION Right 08/23/2020   Procedure: PERIPHERAL VASCULAR INTERVENTION;  Surgeon: [name], MD;   Location: [place] CV LAB;  Service: Cardiovascular;  Laterality: Right;  external iliac  . RADIOLOGY WITH ANESTHESIA [name] 07/01/2019   Procedure: STENTING;  Surgeon: Luanne Bras, MD;  Location: [place];  Service: Radiology;  Laterality: [name];  . RADIOLOGY WITH ANESTHESIA [name] 07/22/2019   Procedure: STENTING;  Surgeon: Luanne Bras, MD;  Location: [place];  Service: Radiology;  Laterality: [name];  . TOTAL SHOULDER ARTHROPLASTY Right 2011   Dr. Tamera Punt  . ULNAR NERVE TRANSPOSITION Left 02/02/2016   Procedure: LEFT IN-SITU DECOMPRESSION ULNAR NERVE ;  Surgeon: Daryll Brod, MD;  Location: [place];  Service: Orthopedics;  Laterality: Left;  . ULNAR TUNNEL RELEASE Left 02/02/2016   Procedure: LEFT CUBITAL TUNNEL RELEASE;  Surgeon: Daryll Brod, MD;  Location: [place];  Service: Orthopedics;  Laterality: Left;  [name] VASECTOMY  1971  . VEIN HARVEST Left 08/16/2017   Procedure: USING NON REVERSE LEFT GREATER SAPHENOUS VEIN HARVEST;  Surgeon: [name], MD;  Location: [place];  Service: Vascular;  Laterality: Left;    Allergies: Patient has no known allergies.  Medications: Prior to Admission medications   Medication Sig Start Date End Date Taking?  Authorizing Provider  acetaminophen (TYLENOL) 500 MG tablet Take 1,000 mg by mouth as needed for moderate pain or headache.    Yes [provider]  aspirin EC 81 MG tablet Take 81 mg by mouth every evening.    Yes [provider]  atorvastatin (LIPITOR) 20 MG tablet Take 20 mg by mouth daily.    Yes [provider]  BRILINTA 90 MG TABS tablet Take 90 mg by mouth 2 (two) times daily. 07/01/19  Yes [provider]  carvedilol (COREG) 3.125 MG tablet TAKE 1 TABLET TWICE A DAY Patient taking differently: Take 3.125 mg by mouth 2 (two) times daily. 04/21/19  Yes Herminio Commons, MD  [facility]an[facility] DM) 30-600 MG 12hr tablet Take 1 tablet by mouth daily.    Yes  [provider]  ENTRESTO 49-51 MG TAKE 1 TABLET TWICE A DAY Patient taking differently: Take 1 tablet by mouth in the morning and at bedtime. 10/27/18  Yes Herminio Commons, MD  gabapentin (NEURONTIN) 300 MG capsule Take 300 mg by mouth 2 (two) times daily.  06/14/15  Yes [provider]  magnesium oxide (MAG-OX) 400 MG tablet Take 400 mg by mouth daily.   Yes [provider]  Multiple Vitamins-Minerals (CENTRUM SILVER PO) Take 1 tablet by mouth daily. 50+   Yes [provider]  Omega-3 Fatty Acids (FISH OIL) 1000 MG CAPS Take 1,000 mg by mouth daily.   Yes [provider]  oxyCODONE-acetaminophen (PERCOCET/ROXICET) 5-325 MG tablet Take 1 tablet by mouth daily as needed for moderate pain.  07/05/20  Yes [provider]  SUPER B COMPLEX/C PO Take 1 tablet by mouth at bedtime.    Yes [provider]  tamsulosin (FLOMAX) 0.4 MG CAPS capsule Take 0.4 mg by mouth 2 (two) times daily.    Yes [provider]  traMADol (ULTRAM) 50 MG tablet Take 50 mg by mouth daily as needed for moderate pain.  04/24/20  Yes [provider]     Family History  Problem Relation Age of Onset  . Coronary artery disease Mother   . Diabetes Mother   . Heart disease Mother        Before age 73 - 22 Bypasses  . Hypertension Mother   . Heart attack Mother        3-4 Heart attacks  . Alzheimer's disease Father   . Diabetes Father   . Heart attack Sister   . Cancer Brother        Prostate  . Hyperlipidemia Son   . Prostate cancer Brother   . Alzheimer's disease Sister   . Colon cancer Neg Hx     Social History   Socioeconomic History  . Marital status: Married    Spouse name: Not on file  . Number of children: 2  . Years of education: Not on file  . Highest education level: Not on file  Occupational History  . Occupation: retired, Clinical biochemist    Comment: Programmer, systems: RETIRED  Tobacco Use  . Smoking status:  Current Every Day Smoker    Packs/day: 1.00    Years: 60.00    Pack years: 60.00    Types: Cigarettes    Start date: 08/20/1957  . Smokeless tobacco: Never Used  Vaping Use  . Vaping Use: Former  . Start date: 09/10/2016  . Quit date: 10/11/2016  Substance and Sexual Activity  . Alcohol use: No    Alcohol/week: 0.0 standard drinks  Comment: quit in July 2015  . Drug use: No  . Sexual activity: Not Currently  Other Topics Concern  . Not on file  Social History Narrative   Accompanied by daughter [name]   Lives w/ wife, daughter   Social Determinants of Health   Financial Resource Strain: Not on file  Food Insecurity: Not on file  Transportation Needs: Not on file  Physical Activity: Not on file  Stress: Not on file  Social Connections: Not on file    Review of Systems: A 12 point ROS discussed and pertinent positives are indicated in the HPI above.  All other systems are negative.  Review of Systems  Constitutional: Negative for activity change, fatigue and fever.  HENT: Negative for tinnitus and trouble swallowing.   Eyes: Negative for visual disturbance.  Respiratory: Negative for cough and shortness of breath.   Cardiovascular: Negative for chest pain.  Gastrointestinal: Negative for abdominal pain.  Musculoskeletal: Negative for back pain and gait problem.  Neurological: Negative for dizziness, tremors, seizures, syncope, facial asymmetry, speech difficulty, weakness, light-headedness, numbness and headaches.  Psychiatric/Behavioral: Negative for behavioral problems and confusion.    Vital Signs: BP (!) 145/73   Pulse 77   Temp 98.1 F (36.7 C) (Oral)   Resp 18   Ht 5\' 3"  (1.6 m)   Wt 150 lb (68 kg)   SpO2 98%   BMI 26.57 kg/m   Physical Exam Vitals reviewed.  HENT:     Mouth/Throat:     Mouth: Mucous membranes are moist.  Cardiovascular:     Rate and Rhythm: Normal rate and regular rhythm.     Heart sounds: Normal heart sounds.   Pulmonary:     Effort: Pulmonary effort is normal.     Breath sounds: Normal breath sounds.  Abdominal:     Palpations: Abdomen is soft.  Musculoskeletal:        General: Normal range of motion.     Comments: Good strength B Moves all 4s  Skin:    General: Skin is warm.  Neurological:     Mental Status: He is alert and oriented to person, place, and time.  Psychiatric:        Behavior: Behavior normal.     Imaging: CUP PACEART REMOTE DEVICE CHECK  Result Date: 01/04/2021 Scheduled remote reviewed. Normal device function.  Known alerts non-sustained RV oversensing episode. Per previous triage note, "patient to report symptoms." Could consider evaluation and reprogramming at next OV for possible decrease in episodes and daily alerts. Continue to monitor per previous reports. Next remote 91 days. HB  VAS US CAROTID  Result Date: 01/09/2021 Carotid Arterial Duplex Study Indications:   Carotid artery disease. Risk Factors:  Hypertension, hyperlipidemia, current smoker, PAD. Other Factors: 07/14/20 CTA neck: right ICA 50%, left ICA 70%. Performing Technologist: June Leap RDMS, RVT  Examination Guidelines: A complete evaluation includes B-mode imaging, spectral Doppler, color Doppler, and power Doppler as needed of all accessible portions of each vessel. Bilateral testing is considered an integral part of a complete examination. Limited examinations for reoccurring indications may be performed as noted.  Right Carotid Findings: +----------+--------+--------+--------+------------------+--------+           PSV cm/sEDV cm/sStenosisPlaque DescriptionComments +----------+--------+--------+--------+------------------+--------+ CCA Prox  74      4                                          +----------+--------+--------+--------+------------------+--------+  CCA Distal98      16              heterogenous               +----------+--------+--------+--------+------------------+--------+  ICA Prox  73      21      1-39%   heterogenous               +----------+--------+--------+--------+------------------+--------+ ICA Mid   50      18                                         +----------+--------+--------+--------+------------------+--------+ ICA Distal76      19                                         +----------+--------+--------+--------+------------------+--------+ ECA       187     8                                          +----------+--------+--------+--------+------------------+--------+ +----------+--------+-------+----------------+-------------------+           PSV cm/sEDV cmsDescribe        Arm Pressure (mmHG) +----------+--------+-------+----------------+-------------------+ [id number]            Multiphasic, WNL                    +----------+--------+-------+----------------+-------------------+ +---------+--------+--------+------+ VertebralPSV cm/sEDV cm/sAbsent +---------+--------+--------+------+  Left Carotid Findings: +----------+--------+--------+--------+------------------+--------+           PSV cm/sEDV cm/sStenosisPlaque DescriptionComments +----------+--------+--------+--------+------------------+--------+ CCA Prox  96      10              heterogenous               +----------+--------+--------+--------+------------------+--------+ CCA Distal91      14              heterogenous               +----------+--------+--------+--------+------------------+--------+ ICA Prox  68      13      1-39%   heterogenous               +----------+--------+--------+--------+------------------+--------+ ICA Mid   86      22                                         +----------+--------+--------+--------+------------------+--------+ ICA Distal95      26                                         +----------+--------+--------+--------+------------------+--------+ ECA       109     14                                          +----------+--------+--------+--------+------------------+--------+ +----------+--------+--------+----------------+-------------------+           PSV cm/sEDV cm/sDescribe        Arm  Pressure (mmHG) +----------+--------+--------+----------------+-------------------+ [id number]              Multiphasic, WNL                    +----------+--------+--------+----------------+-------------------+ +---------+--------+--+--------+--+---------+ VertebralPSV cm/s47EDV cm/s10Antegrade +---------+--------+--+--------+--+---------+   Summary: Right Carotid: Velocities in the right ICA are consistent with a 1-39% stenosis. Left Carotid: Velocities in the left ICA are consistent with a 1-39% stenosis. Vertebrals: Left vertebral artery demonstrates antegrade flow. Right vertebral             artery demonstrates no discernable flow. *See table(s) above for measurements and observations.  Electronically signed by Harold Barban MD on 01/09/2021 at 5:22:29 PM.    Final     Labs:  CBC: Recent Labs    03/31/20 1625 03/31/20 2150 04/01/20 4268 04/06/20 1605 08/23/20 0552 01/17/21 0652  WBC 10.6* 7.7  --  8.2  --  7.9  HGB 9.8* 8.7* 8.1* 9.2* 13.3 15.0  HCT 28.7* 26.2* 24.0* 27.7* 39.0 42.6  PLT 191 181  --  291  --  276    COAGS: Recent Labs    03/29/20 0852 04/06/20 1605 01/17/21 0652  INR 1.1 1.1 1.0  APTT 32  --   --     BMP: Recent Labs    03/29/20 0852 03/31/20 1101 03/31/20 1625 03/31/20 2150 04/06/20 1605 07/14/20 1549 08/23/20 0552  NA 132* 136  --  135 128*  --  137  K 4.3 4.2  --  4.4 4.3  --  4.3  CL 98  --   --  102 93*  --  102  CO2 26  --   --  23 23  --   --   GLUCOSE 118*  --   --  164* 113*  --  95  BUN 8  --   --  10 8  --  16  CALCIUM 9.1  --   --  8.1* 8.5*  --   --   CREATININE 0.69  --  0.68 0.86 0.73 0.70 0.60*  GFRNONAA >60  --  >60 >60 >60  --   --   GFRAA >60  --  >60 >60 >60  --   --     LIVER FUNCTION TESTS: Recent Labs     03/29/20 0852 04/06/20 1605  BILITOT 0.5 0.3  AST 19 17  ALT 16 11  ALKPHOS 77 61  PROT 6.8 6.4*  ALBUMIN 3.9 3.2*    TUMOR MARKERS: No results for input(s): AFPTM, CEA, CA199, CHROMGRNA in the last 8760 hours.  Assessment and Plan:  Known to NIR R ICA angioplasty/stent 2009 Attempted revascularization left vertebral artery 06/2019 Asymptomatic pt at this time Scheduled for follow up cerebral arteriogram Risks and benefits of cerebral angiogram with intervention were discussed with the patient including, but not limited to bleeding, infection, vascular injury, contrast induced renal failure, stroke or even death.  This interventional procedure involves the use of X-rays and because of the nature of the planned procedure, it is possible that we will have prolonged use of X-ray fluoroscopy.  Potential radiation risks to you include (but are not limited to) the following: - A slightly elevated risk for cancer  several years later in life. This risk is typically less than 0.5% percent. This risk is low in comparison to the normal incidence of human cancer, which is 33% for women and 50% for men according to the [place]. - Radiation induced injury can include  skin redness, resembling a rash, tissue breakdown / ulcers and hair loss (which can be temporary or permanent).   The likelihood of either of these occurring depends on the difficulty of the procedure and whether you are sensitive to radiation due to previous procedures, disease, or genetic conditions.   IF your procedure requires a prolonged use of radiation, you will be notified and given written instructions for further action.  It is your responsibility to monitor the irradiated area for the 2 weeks following the procedure and to notify your physician if you are concerned that you have suffered a radiation induced injury.    All of the patient's questions were answered, patient is agreeable to  proceed.  Consent signed and in chart.  Thank you for this interesting consult.  I greatly enjoyed meeting OLANDER FRIEDL and look forward to participating in their care.  A copy of this report was sent to the requesting provider on this date.  Electronically Signed: Lavonia Drafts, PA-C 01/17/2021, 8:06 AM   I spent a total of    25 Minutes in face to face in clinical consultation, greater than 50% of which was counseling/coordinating care for cerebral arteriogram

## 2021-01-17 NOTE — Sedation Documentation (Signed)
Patient transported to short stay. Patient ambulatory to bedside recliner. Right wrist assessed. TR band in place. Clean, dry and intact. No drainage from site. Palpable radial pulse intact +2

## 2021-01-17 NOTE — Procedures (Signed)
S/P 4 vessel cerebral arterriogram RT Rad approach. Findings. 1.50 to 60 % RT ICA cav ICA intracranial intrastent stenosis. 2.40 to 50 % Lt ICA prox stenosis associated with a 5.45mmx 5.9 mm posterior ulcer. 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 4.Bilaterally occluded VA prox with distal reconstitution. S.Taro Hidrogo MD

## 2021-01-17 NOTE — Discharge Instructions (Addendum)
Radial Site Care  This sheet gives you information about how to care for yourself after your procedure. Your health care provider may also give you more specific instructions. If you have problems or questions, contact your health care provider. What can I expect after the procedure? After the procedure, it is common to have:  Bruising and tenderness at the catheter insertion area. Follow these instructions at home: Medicines  Take over-the-counter and prescription medicines only as told by your health care provider. Insertion site care  Follow instructions from your health care provider about how to take care of your insertion site. Make sure you: ? Wash your hands with soap and water before you change your bandage (dressing). If soap and water are not available, use hand sanitizer. ? Change your dressing as told by your health care provider. ? Leave stitches (sutures), skin glue, or adhesive strips in place. These skin closures may need to stay in place for 2 weeks or longer. If adhesive strip edges start to loosen and curl up, you may trim the loose edges. Do not remove adhesive strips completely unless your health care provider tells you to do that.  Check your insertion site every day for signs of infection. Check for: ? Redness, swelling, or pain. ? Fluid or blood. ? Pus or a bad smell. ? Warmth.  Do not take baths, swim, or use a hot tub until your health care provider approves.  You may shower 24-48 hours after the procedure, or as directed by your health care provider. ? Remove the dressing and gently wash the site with plain soap and water. ? Pat the area dry with a clean towel. ? Do not rub the site. That could cause bleeding.  Do not apply powder or lotion to the site. Activity  For 24 hours after the procedure, or as directed by your health care provider: ? Do not flex or bend the affected arm. ? Do not push or pull heavy objects with the affected arm. ? Do not drive  yourself home from the hospital or clinic. You may drive 24 hours after the procedure unless your health care provider tells you not to. ? Do not operate machinery or power tools.  Do not lift anything that is heavier than 10 lb (4.5 kg), or the limit that you are told, until your health care provider says that it is safe.  Ask your health care provider when it is okay to: ? Return to work or school. ? Resume usual physical activities or sports. ? Resume sexual activity.   General instructions  If the catheter site starts to bleed, raise your arm and put firm pressure on the site. If the bleeding does not stop, get help right away. This is a medical emergency.  If you went home on the same day as your procedure, a responsible adult should be with you for the first 24 hours after you arrive home.  Keep all follow-up visits as told by your health care provider. This is important. Contact a health care provider if:  You have a fever.  You have redness, swelling, or yellow drainage around your insertion site. Get help right away if:  You have unusual pain at the radial site.  The catheter insertion area swells very fast.  The insertion area is bleeding, and the bleeding does not stop when you hold steady pressure on the area.  Your arm or hand becomes pale, cool, tingly, or numb. These symptoms may represent a serious   problem that is an emergency. Do not wait to see if the symptoms will go away. Get medical help right away. Call your local emergency services (911 in the [place] Do not drive yourself to the hospital. Summary  After the procedure, it is common to have bruising and tenderness at the site.  Follow instructions from your health care provider about how to take care of your radial site wound. Check the wound every day for signs of infection.  Do not lift anything that is heavier than 10 lb (4.5 kg), or the limit that you are told, until your health care provider says that it  is safe. This information is not intended to replace advice given to you by your health care provider. Make sure you discuss any questions you have with your health care provider. Document Revised: 12/18/2017 Document Reviewed: 12/18/2017 Elsevier Patient Education  2021 [employer] Cerebral Angiogram, Care After This sheet gives you information about how to care for yourself after your procedure. Your health care provider may also give you more specific instructions. If you have problems or questions, contact your health care provider. What can I expect after the procedure? After the procedure, it is common to have:  Bruising and tenderness at the catheter insertion site.  A mild headache. Follow these instructions at home: Insertion site care  Follow instructions from your health care provider about how to take care of the insertion site. Make sure you: ? Wash your hands with soap and water before and after you change your bandage (dressing). If soap and water are not available, use hand sanitizer. ? Change your dressing as told by your health care provider.  Do not take baths, swim, or use a hot tub until your health care provider approves. You may shower 24-48 hours after the procedure, or as told by your health care provider.  To clean your insertion site: ? Gently wash the site with plain soap and water. ? Pat the area dry with a clean towel. ? Do not rub the site. This may cause bleeding.  Do not apply powder or lotion to the site. Keep the site clean and dry. Infection signs Check your incision area every day for signs of infection. Check for:  Redness, swelling, or pain.  Fluid or blood.  Warmth.  Pus or a bad smell.   Activity  Do not drive for 24 hours if you were given a sedative during your procedure.  Rest as told by your health care provider.  Do not lift anything that is heavier than 10 lb (4.5 kg), or the limit that you are told, until your health care  provider says that it is safe.  Return to your normal activities as told by your health care provider, usually in about a week. Ask your health care provider what activities are safe for you. General instructions  If your insertion site starts to bleed, lie flat and put pressure on the site. If the bleeding does not stop, get help right away. This is a medical emergency.  Do not use any products that contain nicotine or tobacco, such as cigarettes, e-cigarettes, and chewing tobacco. If you need help quitting, ask your health care provider.  Take over-the-counter and prescription medicines only as told by your health care provider.  Drink enough fluid to keep your urine pale yellow. This helps flush the contrast dye from your body.  Keep all follow-up visits as directed by your health care provider. This is important.     Contact a health care provider if:  You have a fever or chills.  You have redness, swelling, or pain around your insertion site.  You have fluid or blood coming from your insertion site.  The insertion site feels warm to the touch.  You have pus or a bad smell coming from your insertion site.  You notice blood collecting in the tissue around the insertion site (hematoma). The hematoma may be painful to the touch. Get help right away if:  You have chest pain or trouble breathing.  You have severe pain or swelling at the insertion site.  The insertion area bleeds, and bleeding continues after 30 minutes of holding steady pressure on the site.  The arm or leg where the catheter was inserted is pale, cold, numb, tingling, or weak.  You have a rash.  You have any symptoms of a stroke. "BE FAST" is an easy way to remember the main warning signs of a stroke: ? B - Balance. Signs are dizziness, sudden trouble walking, or loss of balance. ? E - Eyes. Signs are trouble seeing or a sudden change in vision. ? F - Face. Signs are sudden weakness or numbness of the face, or  the face or eyelid drooping on one side. ? A - Arms. Signs are weakness or numbness in an arm. This happens suddenly and usually on one side of the body. ? S - Speech. Signs are sudden trouble speaking, slurred speech, or trouble understanding what people say. ? T - Time. Time to call emergency services. Write down what time symptoms started.  You have other signs of a stroke, such as: ? A sudden, severe headache with no known cause. ? Nausea or vomiting. ? Seizure. These symptoms may represent a serious problem that is an emergency. Do not wait to see if the symptoms will go away. Get medical help right away. Call your local emergency services (911 in the [place] Do not drive yourself to the hospital. Summary  Bruising and tenderness at the insertion site are common.  Follow your health care provider's instructions about caring for your insertion site. Change dressing and clean the area as instructed.  If your insertion site bleeds, apply direct pressure until bleeding stops.  Return to your normal activities as told by your health care provider. Ask what activities are safe.  Rest and drink plenty of fluids. This information is not intended to replace advice given to you by your health care provider. Make sure you discuss any questions you have with your health care provider. Document Revised: 06/02/2019 Document Reviewed: 06/02/2019 Elsevier Patient Education  2021 [employer] Moderate Conscious Sedation, Adult Sedation is the use of medicines to promote relaxation and to relieve discomfort and anxiety. Moderate conscious sedation is a type of sedation. Under moderate conscious sedation, you are less alert than normal, but you are still able to respond to instructions, touch, or both. Moderate conscious sedation is used during short medical and dental procedures. It is milder than deep sedation, which is a type of sedation under which you cannot be easily woken up. It is also milder  than general anesthesia, which is the use of medicines to make you unconscious. Moderate conscious sedation allows you to return to your regular activities sooner. Tell a health care provider about:  Any allergies you have.  All medicines you are taking, including vitamins, herbs, eye drops, creams, and over-the-counter medicines.  Any use of steroids. This includes steroids taken by mouth or as a   cream.  Any problems you or family members have had with sedatives and anesthetic medicines.  Any blood disorders you have.  Any surgeries you have had.  Any medical conditions you have, such as sleep apnea.  Whether you are pregnant or may be pregnant.  Any use of cigarettes, alcohol, marijuana, or drugs. What are the risks? Generally, this is a safe procedure. However, problems may occur, including:  Getting too much medicine (oversedation).  Nausea.  Allergic reaction to medicines.  Trouble breathing. If this happens, a breathing tube may be used. It will be removed when you are awake and breathing on your own.  Heart trouble.  Lung trouble.  Confusion that gets better with time (emergence delirium). What happens before the procedure? Staying hydrated Follow instructions from your health care provider about hydration, which may include:  Up to 2 hours before the procedure - you may continue to drink clear liquids, such as water, clear fruit juice, black coffee, and plain tea. Eating and drinking restrictions Follow instructions from your health care provider about eating and drinking, which may include:  8 hours before the procedure - stop eating heavy meals or foods, such as meat, fried foods, or fatty foods.  6 hours before the procedure - stop eating light meals or foods, such as toast or cereal.  6 hours before the procedure - stop drinking milk or drinks that contain milk.  2 hours before the procedure - stop drinking clear liquids. Medicines Ask your health care  provider about:  Changing or stopping your regular medicines. This is especially important if you are taking diabetes medicines or blood thinners.  Taking medicines such as aspirin and ibuprofen. These medicines can thin your blood. Do not take these medicines unless your health care provider tells you to take them.  Taking over-the-counter medicines, vitamins, herbs, and supplements. Tests and exams  You will have a physical exam.  You may have blood tests done to show how well: ? Your kidneys and liver work. ? Your blood clots. General instructions  Plan to have a responsible adult take you home from the hospital or clinic.  If you will be going home right after the procedure, plan to have a responsible adult care for you for the time you are told. This is important. What happens during the procedure?  You will be given the sedative. The sedative may be given: ? As a pill that you will swallow. It can also be inserted into the rectum. ? As a spray through the nose. ? As an injection into the muscle. ? As an injection into the vein through an IV.  You may be given oxygen as needed.  Your breathing, heart rate, and blood pressure will be monitored during the procedure.  The medical or dental procedure will be done. The procedure may vary among health care providers and hospitals.   What happens after the procedure?  Your blood pressure, heart rate, breathing rate, and blood oxygen level will be monitored until you leave the hospital or clinic.  You will get fluids through your IV if needed.  Do not drive or operate machinery until your health care provider says that it is safe. Summary  Sedation is the use of medicines to promote relaxation and to relieve discomfort and anxiety. Moderate conscious sedation is a type of sedation that is used during short medical and dental procedures.  Tell the health care provider about any medical conditions that you have and about all  the   medicines that you are taking.  You will be given the sedative as a pill, a spray through the nose, an injection into the muscle, or an injection into the vein through an IV. Vital signs are monitored during the sedation.  Moderate conscious sedation allows you to return to your regular activities sooner. This information is not intended to replace advice given to you by your health care provider. Make sure you discuss any questions you have with your health care provider. Document Revised: 03/11/2020 Document Reviewed: 10/08/2019 Elsevier Patient Education  2021 [employer]  

## 2021-01-17 NOTE — Progress Notes (Signed)
Client c/o hard area felt on right forearm; paged PA

## 2021-01-17 NOTE — Sedation Documentation (Signed)
End tidal capnography removed at this time per Dr. Estanislado Pandy

## 2021-01-24 ENCOUNTER — Other Ambulatory Visit (HOSPITAL_COMMUNITY): Payer: Self-pay | Admitting: Interventional Radiology

## 2021-01-24 DIAGNOSIS — I771 Stricture of artery: Secondary | ICD-10-CM

## 2021-01-25 ENCOUNTER — Telehealth (HOSPITAL_COMMUNITY): Payer: Self-pay | Admitting: Radiology

## 2021-01-25 NOTE — Telephone Encounter (Signed)
Called pt, left VM for him to call to schedule treatment for stenosis with Dr. Estanislado Pandy. JM

## 2021-04-05 ENCOUNTER — Ambulatory Visit (INDEPENDENT_AMBULATORY_CARE_PROVIDER_SITE_OTHER): Payer: Medicare Other

## 2021-04-05 DIAGNOSIS — I255 Ischemic cardiomyopathy: Secondary | ICD-10-CM

## 2021-04-05 LAB — CUP PACEART REMOTE DEVICE CHECK
Battery Remaining Longevity: 24 mo
Battery Remaining Percentage: 30 %
Battery Voltage: 2.87 V
Brady Statistic AP VP Percent: 31 %
Brady Statistic AP VS Percent: 1 %
Brady Statistic AS VP Percent: 68 %
Brady Statistic AS VS Percent: 1 %
Brady Statistic RA Percent Paced: 30 %
Date Time Interrogation Session: 20220511031821
HighPow Impedance: 82 Ohm
HighPow Impedance: 82 Ohm
Implantable Lead Implant Date: 20160923
Implantable Lead Implant Date: 20160923
Implantable Lead Implant Date: 20160923
Implantable Lead Location: 753858
Implantable Lead Location: 753859
Implantable Lead Location: 753860
Implantable Lead Model: 7122
Implantable Pulse Generator Implant Date: 20160923
Lead Channel Impedance Value: 440 Ohm
Lead Channel Impedance Value: 480 Ohm
Lead Channel Impedance Value: 800 Ohm
Lead Channel Pacing Threshold Amplitude: 0.75 V
Lead Channel Pacing Threshold Amplitude: 0.75 V
Lead Channel Pacing Threshold Amplitude: 1.5 V
Lead Channel Pacing Threshold Pulse Width: 0.5 ms
Lead Channel Pacing Threshold Pulse Width: 0.5 ms
Lead Channel Pacing Threshold Pulse Width: 0.8 ms
Lead Channel Sensing Intrinsic Amplitude: 12 mV
Lead Channel Sensing Intrinsic Amplitude: 3.9 mV
Lead Channel Setting Pacing Amplitude: 2 V
Lead Channel Setting Pacing Amplitude: 2 V
Lead Channel Setting Pacing Amplitude: 2.5 V
Lead Channel Setting Pacing Pulse Width: 0.5 ms
Lead Channel Setting Pacing Pulse Width: 0.8 ms
Lead Channel Setting Sensing Sensitivity: 1 mV
Pulse Gen Serial Number: 7294918

## 2021-04-10 ENCOUNTER — Ambulatory Visit: Payer: Medicare Other | Admitting: Family Medicine

## 2021-04-16 NOTE — Progress Notes (Signed)
Cardiology Office Note  Date: 04/16/2021   ID: Roy [name], DOB 1946-06-21, MRN [id number]  PCP:  Benita Stabile, MD  Cardiologist:  None Electrophysiologist:  None   Chief Complaint: Follow-up ischemic cardiomyopathy  History of Present Illness: Roy [name] is a 75 y.o. male with a history of ischemic cardiomyopathy, chronic systolic heart failure, status post BiV ICD, cerebrovascular disease with TIA with right ICA stent, COPD, alcoholic cirrhosis, alcohol abuse, PVD, tobacco abuse, thrombocytopenia.  Last encounter with Dr. Ladona Ridgel EP 07/13/2020 for ongoing follow-up and troubleshooting of his ICD.  He had developed some noise on his ICD lead.  Dr. Ladona Ridgel reduced his sensitivity on that visit.  Having class II CHF symptoms.  Denied any anginal symptoms.  Limited in activity by claudication.  Encouraged to stop smoking.  Had a peripheral vascular balloon angioplasty, abdominal aortogram with lower extremity peripheral vascular intervention on 08/23/2020 by Dr. Myra Gianotti.  Had 70% distal right external iliac artery stenosis treated using a 7 x 60 Liviu stent with no residual stenosis.  Approximate 50% stenosis in the proximal left common femoral artery which correlated with ultrasound findings.  This was treated with a 5 x 40 drug coated Ranger balloon.  Approximately 3% right common femoral artery stenosis and right profundofemoral stenosis.  Widely patent left femoral-popliteal bypass graft.  Widely patent right superficial femoral artery stents.  He was here last visit for 85-month follow-up.  Had recent peripheral vascular intervention with Dr. Myra Gianotti with stenting of distal right external iliac artery balloon angioplasty 50% stenosis in proximal left common femoral artery.  See results above.  He denied claudication-like symptoms. Recent device check of his ICD showed battery life 2.4 years.  He had some interference in one of his leads.  He was given a magnet in the event of AICD shock  due to lead interference.[name]  He states Dr. Ladona Ridgel plans to replace the lead later when device needs a generator change.  He states Dr. Corliss Skains plans to intervene on the disease in his carotid arteries in the near future.  See recent CT angio of head and neck low with significant carotid disease noted.  He denies any current claudication-like symptoms, lower extremity edema.  He continues to smoke.   He saw Dr Ladona Ridgel on 11/09/2020. He was experiencing ICD lead noise and was given a magnet to use in the event he had an ICD shock. His HF symptoms remained class II NYHA. He was not having any anginal symptoms.  He is here for follow-up today and states he has had no issues in the interim since last visit.  He denies any anginal or exertional symptoms, palpitations or arrhythmias, orthostatic symptoms, CVA or TIA-like symptoms, PND, orthopnea, bleeding issues, claudication-like symptoms, DVT or PE-like symptoms, or lower extremity edema.  He is maintaining his weight between 150 pounds to 152 pounds.  He continues to smoke in spite of being encouraged to stop.  States he has been smoking 50+ years.  States he had some reasonable a recent lab work and everything was normal including his lipid levels.  States he carries his magnet with him wherever he goes due to lead noise on his ICD.  His EKG today shows an AV sequential  dual chamber pacemaker rhythm rate of 60.  Past Medical History:  Diagnosis Date  . AICD (automatic cardioverter/defibrillator) present 08/19/2015   St Jude BiV ICD for primary prevention by Dr. Ladona Ridgel  . Alcohol abuse    6 beers per  day; hospital admission in 2009 for withdrawal symptoms  . Alcoholic cirrhosis ([place])   . Anxiety and depression    denies   . Cerebrovascular disease 2009   TIA; 2009- right ICA stent; re-intervention for restenosis complicated by [facility] w/o sx  . CHF (congestive heart failure) ([place]) 06-02-14  . Chronic obstructive pulmonary disease ([place])   . Degenerative  joint disease    Total shoulder arthroplasty-right  . Hyperlipidemia   . Hypertension   . LBBB (left bundle branch block)    Normal echo-2011; stress nuclear in 09/2010--septal hypoperfusion representing nontransmural infarction or the effect of left bundle branch block, no ischemia  . Myocardial infarction [place]) June 02, 2014   Massive Heart Attack  . Peripheral vascular disease ([place])   . 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  . Thrombocytopenia ([place])   . Tobacco abuse    -100 pack years; 1.5 packs per day  . Traumatic seroma of thigh ([place])    left  . Tubular adenoma of colon     Past Surgical History:  Procedure Laterality Date  . ABDOMINAL AORTAGRAM [name] 05/04/2014   Procedure: ABDOMINAL Maxcine Ham;  Surgeon: [name], MD;  Location: [facility] CATH LAB;  Service: Cardiovascular;  Laterality: [name];  . ABDOMINAL AORTOGRAM [name] 06/25/2017   Procedure: Abdominal Aortogram;  Surgeon: [name], MD;  Location: [place] CV LAB;  Service: Cardiovascular;  Laterality: [name];  . ABDOMINAL AORTOGRAM W/LOWER EXTREMITY [name] 04/22/2018   Procedure: ABDOMINAL AORTOGRAM W/LOWER EXTREMITY;  Surgeon: [name], MD;  Location: [place] CV LAB;  Service: Cardiovascular;  Laterality: [name];  . ABDOMINAL AORTOGRAM W/LOWER EXTREMITY [name] 03/08/2020   Procedure: ABDOMINAL AORTOGRAM W/LOWER EXTREMITY;  Surgeon: [name], MD;  Location: [place] CV LAB;  Service: Cardiovascular;  Laterality: [name];  . ABDOMINAL AORTOGRAM W/LOWER EXTREMITY Bilateral 08/23/2020   Procedure: ABDOMINAL AORTOGRAM W/LOWER EXTREMITY;  Surgeon: [name], MD;  Location: [place] CV LAB;  Service: Cardiovascular;  Laterality: Bilateral;  . AGILE CAPSULE [name] 03/18/2014   Procedure: AGILE CAPSULE;  Surgeon: Danie Binder, MD;  [facility];  Service: Endoscopy;  Laterality: [name];  7:30  . APPLICATION OF WOUND VAC Left 09/03/2017   Procedure: APPLICATION OF WOUND VAC;  Surgeon: Conrad Tesuque,  MD;  Location: [place];  Service: Vascular;  Laterality: Left;  . APPLICATION OF WOUND VAC Right 04/07/2020   Procedure: PLACEMENT OF ANTIBIOTIC BEADS AND APPLICATION OF WOUND VAC;  Surgeon: [name], MD;  Location: [place];  Service: Vascular;  Laterality: Right;  . BACK SURGERY    . BACTERIAL OVERGROWTH TEST [name] 05/24/2015   Procedure: BACTERIAL OVERGROWTH TEST;  Surgeon: Danie Binder, MD;  [facility];  Service: Endoscopy;  Laterality: [name];  0700  . BI-VENTRICULAR IMPLANTABLE CARDIOVERTER DEFIBRILLATOR  (CRT-D)  08/19/2015  . CARPAL TUNNEL RELEASE Left 02/02/2016   Procedure: LEFT CARPAL TUNNEL RELEASE;  Surgeon: Daryll Brod, MD;  Location: [place];  Service: Orthopedics;  Laterality: Left;  ANESTHESIA: IV REGIONAL UPPER ARM  . CATARACT EXTRACTION W/PHACO Right 03/08/2015   Procedure: CATARACT EXTRACTION PHACO AND INTRAOCULAR LENS PLACEMENT (IOC);  Surgeon: Rutherford Guys, MD;  Location: [facility];  Service: Ophthalmology;  Laterality: Right;  CDE:9.46  . CATARACT EXTRACTION W/PHACO Left 03/22/2015   Procedure: CATARACT EXTRACTION PHACO AND INTRAOCULAR LENS PLACEMENT (IOC);  Surgeon: Rutherford Guys, MD;  [facility]: [facility];  Service: Ophthalmology;  Laterality: Left;  CDE:5.80  . COLONOSCOPY  08/22/09  Fields-(Tubular Adenoma)3-mm transverse polyp/4-mm polyp otherwise noraml/small internal hemorrhoids  . COLONOSCOPY [name] 04/14/2014   [id number] internal hemorrhids/normal mocsa in the terminal iluem/left colonis redundant  . COLONOSCOPY W/ POLYPECTOMY  2011  . ENDARTERECTOMY FEMORAL Left 08/16/2017   Procedure: ENDARTERECTOMY LEFT PROFUNDA FEMORAL;  Surgeon: [name], MD;  Location: [place];  Service: Vascular;  Laterality: Left;  . ENDARTERECTOMY FEMORAL Right 03/31/2020   Procedure: RIGHT FEMORAL ENDARTERECTOMY;  Surgeon: [name], MD;  Location: [place];  Service: Vascular;  Laterality: Right;  . EP IMPLANTABLE DEVICE [name] 08/19/2015   Procedure: BiV ICD Insertion  CRT-D;  Surgeon: Evans Lance, MD;  Location: [place] CV LAB;  Service: Cardiovascular;  Laterality: [name];  . ESOPHAGOGASTRODUODENOSCOPY [name] 03/05/2014   SLF: 1. Stricture at the gastroesophagael junction 2. Small hiatal hernia 3. Moderate non-erosive gastritis and duodentitis. 4. No surce for Melena identified.   . FEMORAL-POPLITEAL BYPASS GRAFT Left 08/16/2017   Procedure: LEFT  FEMORAL-POPLITEAL ARTERY  BYPASS GRAFT;  Surgeon: [name], MD;  Location: [place];  Service: Vascular;  Laterality: Left;  . GIVENS CAPSULE STUDY [name] 03/30/2014   Procedure: GIVENS CAPSULE STUDY;  Surgeon: Danie Binder, MD;  [facility];  Service: Endoscopy;  Laterality: [name];  7:30  . GROIN DEBRIDEMENT Right 04/07/2020   Procedure: INCISION AND DRAINAGE OF RIGHT GROIN;  Surgeon: [name], MD;  [facility];  Service: Vascular;  Laterality: Right;  . I & D EXTREMITY Left 09/03/2017   Procedure: IRRIGATION AND DEBRIDEMENT EXTREMITY LEFT THIGH SEROMA;  Surgeon: Conrad Stanwood, MD;  Location: [place];  Service: Vascular;  Laterality: Left;  . INSERT / REPLACE / REMOVE PACEMAKER    . INSERTION OF ILIAC STENT  03/31/2020   Procedure: INSERTION OF RIGHT ILIAC ARTERY STENT AND RIGHT SUPERFICIAL FEMORAL ARTERY STENT;  Surgeon: [name], MD;  [facility];  Service: Vascular;;  . IR ANGIO INTRA EXTRACRAN SEL COM CAROTID INNOMINATE BILAT MOD SED  07/16/2017  . IR ANGIO INTRA EXTRACRAN SEL COM CAROTID INNOMINATE BILAT MOD SED  06/12/2019  . IR ANGIO INTRA EXTRACRAN SEL COM CAROTID INNOMINATE BILAT MOD SED  01/17/2021  . IR ANGIO VERTEBRAL SEL SUBCLAVIAN INNOMINATE BILAT MOD SED  07/16/2017  . IR ANGIO VERTEBRAL SEL SUBCLAVIAN INNOMINATE BILAT MOD SED  06/12/2019  . IR ANGIO VERTEBRAL SEL SUBCLAVIAN INNOMINATE BILAT MOD SED  01/17/2021  . IR RADIOLOGIST EVAL & MGMT  04/01/2017  . IR RADIOLOGIST EVAL & MGMT  08/02/2017  . IR TRANSCATH EXCRAN VERT OR CAR A STENT  07/22/2019  . IR US GUIDE VASC ACCESS RIGHT   06/12/2019  . IR US GUIDE VASC ACCESS RIGHT  01/17/2021  . JOINT REPLACEMENT Right    Total Shoulder Replacement   . LOWER EXTREMITY ANGIOGRAPHY Bilateral 06/25/2017   Procedure: Lower Extremity Angiography;  Surgeon: [name], MD;  Location: [place] CV LAB;  Service: Cardiovascular;  Laterality: Bilateral;  . LUMBAR FUSION  2010  . PATCH ANGIOPLASTY Right 03/31/2020   Procedure: PATCH ANGIOPLASTY USING XENOSURE BIOLOGIC PATCH 1cm x 14cm;  Surgeon: [name], MD;  Location: [facility] OR;  Service: Vascular;  Laterality: Right;  . PERIPHERAL VASCULAR ATHERECTOMY Right 04/22/2018   Procedure: PERIPHERAL VASCULAR ATHERECTOMY;  Surgeon: [name], MD;  Location: [place] CV LAB;  Service: Cardiovascular;  Laterality: Right;  right superficial femoral  . PERIPHERAL VASCULAR BALLOON ANGIOPLASTY Right 04/22/2018   Procedure: PERIPHERAL VASCULAR BALLOON ANGIOPLASTY;  Surgeon:  Nada Libman, MD;  [facility];  Service: Cardiovascular;  Laterality: Right;  external iliac  . PERIPHERAL VASCULAR BALLOON ANGIOPLASTY Left 03/08/2020   Procedure: PERIPHERAL VASCULAR BALLOON ANGIOPLASTY;  Surgeon: Nada Libman, MD;  [facility];  Service: Cardiovascular;  Laterality: Left;  Common Femoral  . PERIPHERAL VASCULAR BALLOON ANGIOPLASTY Left 08/23/2020   Procedure: PERIPHERAL VASCULAR BALLOON ANGIOPLASTY;  Surgeon: Nada Libman, MD;  [facility];  Service: Cardiovascular;  Laterality: Left;  common femoral  . PERIPHERAL VASCULAR INTERVENTION Right 03/08/2020   Procedure: PERIPHERAL VASCULAR INTERVENTION;  Surgeon: Nada Libman, MD;  [facility];  Service: Cardiovascular;  Laterality: Right;  SFA  . PERIPHERAL VASCULAR INTERVENTION Right 08/23/2020   Procedure: PERIPHERAL VASCULAR INTERVENTION;  Surgeon: Nada Libman, MD;  [facility];  Service: Cardiovascular;  Laterality: Right;  external iliac  .  RADIOLOGY WITH ANESTHESIA [name] 07/01/2019   Procedure: STENTING;  Surgeon: Julieanne Cotton, MD;  [facility];  Service: Radiology;  Laterality: [name];  . RADIOLOGY WITH ANESTHESIA [name] 07/22/2019   Procedure: STENTING;  Surgeon: Julieanne Cotton, MD;  [facility];  Service: Radiology;  Laterality: [name];  . TOTAL SHOULDER ARTHROPLASTY Right 2011   Dr. Ave Filter  . ULNAR NERVE TRANSPOSITION Left 02/02/2016   Procedure: LEFT IN-SITU DECOMPRESSION ULNAR NERVE ;  Surgeon: Cindee Salt, MD;  Location: [facility];  Service: Orthopedics;  Laterality: Left;  . ULNAR TUNNEL RELEASE Left 02/02/2016   Procedure: LEFT CUBITAL TUNNEL RELEASE;  Surgeon: Cindee Salt, MD;  Location: [facility];  Service: Orthopedics;  Laterality: Left;  [name] VASECTOMY  1971  . VEIN HARVEST Left 08/16/2017   Procedure: USING NON REVERSE LEFT GREATER SAPHENOUS VEIN HARVEST;  Surgeon: Nada Libman, MD;  [facility];  Service: Vascular;  Laterality: Left;    Current Outpatient Medications  Medication Sig Dispense Refill  . acetaminophen (TYLENOL) 500 MG tablet Take 1,000 mg by mouth as needed for moderate pain or headache.     [name] aspirin EC 81 MG tablet Take 81 mg by mouth every evening.     [name] atorvastatin (LIPITOR) 20 MG tablet Take 20 mg by mouth daily.     [name] BRILINTA 90 MG TABS tablet Take 90 mg by mouth 2 (two) times daily.    . carvedilol (COREG) 3.125 MG tablet TAKE 1 TABLET TWICE A DAY (Patient taking differently: Take 3.125 mg by mouth 2 (two) times daily.) 180 tablet 0  . dextromethorphan-guaiFENesin (MUCINEX DM) 30-600 MG 12hr tablet Take 1 tablet by mouth daily.     [name] ENTRESTO 49-51 MG TAKE 1 TABLET TWICE A DAY (Patient taking differently: Take 1 tablet by mouth in the morning and at bedtime.) 180 tablet 4  . gabapentin (NEURONTIN) 300 MG capsule Take 300 mg by mouth 2 (two) times daily.     . magnesium oxide (MAG-OX) 400 MG tablet Take 400 mg by mouth daily.    . Multiple  Vitamins-Minerals (CENTRUM SILVER PO) Take 1 tablet by mouth daily. 50+    . Omega-3 Fatty Acids (FISH OIL) 1000 MG CAPS Take 1,000 mg by mouth daily.    [name] oxyCODONE-acetaminophen (PERCOCET/ROXICET) 5-325 MG tablet Take 1 tablet by mouth daily as needed for moderate pain.     . SUPER B COMPLEX/C PO Take 1 tablet by mouth at bedtime.     . tamsulosin (FLOMAX) 0.4 MG CAPS capsule Take 0.4 mg  by mouth 2 (two) times daily.     . traMADol (ULTRAM) 50 MG tablet Take 50 mg by mouth daily as needed for moderate pain.      No current facility-administered medications for this visit.   Allergies:  Patient has no known allergies.   Social History: The patient  reports that he has been smoking cigarettes. He started smoking about 63 years ago. He has a 60.00 pack-year smoking history. He has never used smokeless tobacco. He reports that he does not drink alcohol and does not use drugs.   Family History: The patient's family history includes Alzheimer's disease in his father and sister; Cancer in his brother; Coronary artery disease in his mother; Diabetes in his father and mother; Heart attack in his mother and sister; Heart disease in his mother; Hyperlipidemia in his son; Hypertension in his mother; Prostate cancer in his brother.   ROS:  Please see the history of present illness. Otherwise, complete review of systems is positive for none.  All other systems are reviewed and negative.   Physical Exam: VS:  There were no vitals taken for this visit., BMI There is no height or weight on file to calculate BMI.  Wt Readings from Last 3 Encounters:  01/17/21 150 lb (68 kg)  01/09/21 150 lb 6.4 oz (68.2 kg)  11/09/20 150 lb 12.8 oz (68.4 kg)    General: Patient appears comfortable at rest. Neck: Supple, no elevated JVP or carotid bruits, no thyromegaly. Lungs: Clear to auscultation, nonlabored breathing at rest. Cardiac: Regular rate and rhythm, no S3 or significant systolic murmur, no pericardial  rub. Extremities: No pitting edema, distal pulses 2+. Skin: Warm and dry. Musculoskeletal: No kyphosis. Neuropsychiatric: Alert and oriented x3, affect grossly appropriate.  ECG:  EKG March 22, 2020 electronic ventricular pacemaker rate of 68  Recent Labwork: 01/17/2021: BUN 11; Creatinine, Ser 0.65; Hemoglobin 15.0; Platelets 276; Potassium 4.7; Sodium 135     Component Value Date/Time   CHOL 177 01/08/2012 0809   TRIG 77 06/08/2014 0331   HDL 53 10/18/2010 1927   CHOLHDL 1.8 10/18/2010 1927   VLDL 8 10/18/2010 1927   [place]  10/18/2010 1927    35        Total Cholesterol/HDL:CHD Risk Coronary Heart Disease Risk Table                     Men   Women  1/2 Average Risk   3.4   3.3  Average Risk       5.0   4.4  2 X Average Risk   9.6   7.1  3 X Average Risk  23.4   11.0        Use the calculated Patient Ratio above and the CHD Risk Table to determine the patient's CHD Risk.        ATP III CLASSIFICATION (LDL):  <100     mg/dL   Optimal  100-129  mg/dL   Near or Above                    Optimal  130-159  mg/dL   Borderline  160-189  mg/dL   High  >190     mg/dL   Very High    Other Studies Reviewed Today:  CT angiography of neck/head 07/14/2020 CTA NECK FINDINGS  Aortic arch: Diffuse atheromatous plaque. No dissection or aneurysm seen. There is a degree of narrowing at the brachiocephalic origin not well established due to  poor bolus and calcified plaque blooming  Right carotid system: Diffuse atheromatous change along the common carotid and proximal ICA. Up to 50% stenosis at the ICA bulb due to calcified plaque.  Left carotid system: Very limited assessment intermittently at the level of the common carotid due to streak artifact and poor bolus. Calcified plaque at the bifurcation and bulb with bulb stenosis measuring 70% when measured from calcified between the ages of calcified plaque.  Vertebral arteries: Brachiocephalic origin stenosis as noted  above. Calcified plaque at both V1 segments. The vertebral arteries are essentially not assessed on either side due to poor bolus quality. Strong left vertebral artery dominance. Both vertebral arteries are enhancing at the level of the dura.  Skeleton: Disc and facet degeneration. Edentulous with alveolar ridge atrophy above and below.  Other neck: No significant incidental finding.  Upper chest: Centrilobular emphysema. Mild fibrotic changes at the apices  Review of the MIP images confirms the above findings  CTA HEAD FINDINGS  Anterior circulation: Right-sided siphon stent, lumen not assessed due to previously described limitations. There is a heavily calcified left carotid siphon with 60% stenosis by best estimate at the paraclinoid segment. Hypoplastic/aplastic right A1 segment. Atheromatous narrowing seen to bilateral MCA branches. Asymmetric less filling of right MCA branches with an apparent high-grade right M1 stenosis.  Posterior circulation: Strong left vertebral artery dominance. Calcified plaque at both V4 segments. Calcified plaque at the basilar with at least moderate stenosis, small vessel size not permitting accurate measurement. No detected branch occlusion, beading, or aneurysm  Venous sinuses: Well opacified.  Anatomic variants: As above  Review of the MIP images confirms the above findings  IMPRESSION: 1. Severely limited CTA due to bolus timing/quality. The left side was injected and there is left brachiocephalic stenosis from pacer wires. In the future, recommend right-sided injection. 2. Estimated 70% left proximal ICA stenosis. 3. Estimated 50% right proximal ICA stenosis. 4. Intracranial ICA stent on the right which is likely patent. There appears to be high-grade stenosis at the right M1 segment. 5. Estimated 60% left paraclinoid ICA stenosis. 6. The vertebral arteries are very poorly characterized due to limitations. Subjectively  moderate proximal vertebral stenosis due to calcified plaque.   09/26/2020 vascular ultrasound ABI Bilateral ABIs appear essentially unchanged compared to prior study on 08/08/2020. Summary: Right: Resting right ankle-brachial index indicates mild right lower extremity arterial disease. Left: Resting left ankle-brachial index indicates mild left lower extremity arterial disease. The left toebrachial index is abnormal.    ABDOMINAL AORTA STUDY 09/26/2020 Indications: Peripheral artery disease, patient currently experiencing no new leg problems. Risk Factors: Hypertension, hyperlipidemia, prior MI. Vascular Interventions: 2015: Bilateral EIA stents 2018: Left femoropopliteal bypass graft, Left profunda endarterectomy, Left EIA endarterectomy, Angioplasty of the right EIA, and Angioplasty of the right SFA 03/31/2020: RCIA stent, R SFA stent, Endarterectomy right EIA, CFA, PF, and SFA. 08/23/2020: Right EIA stent and left common femoral artery angioplasty Performing Technologist: Ronal Fear RVS, RCS Examination Guidelines: A complete evaluation includes B-mode imaging, spectral Doppler, color Doppler, and power Doppler as needed of all accessible portions of each vessel. Bilateral testing is considered an integral part of a complete examination. Limited examinations for reoccurring indications may be performed as noted. Location AP (cm) Trans (cm) PSV (cm/s) Waveform Thrombus Comments RT CIA Prox 128 biphasic RT CIA Mid 148 biphasic RT CIA Distal 137 biphasic RT EIA Prox 124 RT EIA Mid 97 RT EIA Distal 120 LT EIA Prox 193 LT EIA Mid 138 LT EIA Distal  596 appears distal to stent end  FINNBAR VARDA [id number] 09/26/2020 Velocity appears to be distal to the end of the stent. Patent right common femoral artery. Patent left common femoral artery with mildly elevated velocities (50-74%) leading up to the endarterectomy site; most likely due to change in vessel  diameter. *See table(s) above for measurements and observations. Location Stenosis Stent Right Common Iliac no stenosis Right External Iliac no stenosis Left External Iliac >50% stenosis Electronically signed by Harold Barban MD on 09/26/2020 at 10:21:52 AM.   Peripheral vascular balloon angioplasty, abdominal aortogram with left lower extremity peripheral vascular intervention 08/23/2020 Impression:               #1  70% distal right external iliac artery stenosis treated using a 7 x 60 Elluvia stent with no residual stenosis.               #2  Approximate 50% stenosis in the proximal left common femoral artery which correlated with the ultrasound findings.  This was treated using a 5 x 40 drug-coated Ranger balloon               #3  Approximate 50% right common femoral artery stenosis and right profundofemoral stenosis.               #4  Widely patent left femoral-popliteal bypass graft               #5  Widely patent right superficial femoral artery stents.    03/08/2020 Peripheral vascular catheterization Pre-operative Diagnosis:Right leg claudication Post-operative diagnosis:Same Surgeon:Wells Brabham Procedure Performed: 1. Ultrasound-guided access, left femoral artery 2. Abdominal aortogram 3. Bilateral lower extremity runoff 4. Stent, right superficial femoral artery 5. Drug-coated balloon angioplasty, left common femoral artery 6. Conscious sedation, 77 minutes 7. Closure device, Mynx  Indications:The patient has a history of left femoral-popliteal bypass graft. He has a known stable inflow stenosis. He presented with severe symptoms of claudication in the right leg and comes in today for further evaluation.  Procedure:The patient was identified in the holding area and taken to room 8. The patient was then placed supine on the table and prepped and draped in  the usual sterile fashion. A time out was called. Conscious sedation was administered with the use of IV fentanyl and Versed under continuous physician and nurse monitoring. Heart rate, blood pressure, and oxygen saturation were continuously monitored. Total sedation time was 77 minutes. Ultrasound was used to evaluate the left common femoral artery. It was patent . A digital ultrasound image was acquired. A micropuncture needle was used to access the left common femoral artery under ultrasound guidance. An 018 wire was advanced without resistance and a micropuncture sheath was placed. The 018 wire was removed and a benson wire was placed. The micropuncture sheath was exchanged for a 5 french sheath. An omniflush catheter was advanced over the wire to the level of L-1. An abdominal angiogram was obtained. Next, using the omniflush catheter, placed at the aortic bifurcation, pelvic imaging and bilateral lower extremity runoff was performed. Findings:  Aortogram:No significant renal artery stenosis. The infrarenal abdominal aorta is calcified without significant stenosis. There is a 50% stenosis at the origin of the right common iliac artery. The stent within the right external iliac artery is patent throughout its course. The left common iliac artery is calcified but without significant stenosis. The left external iliac artery and stent are patent throughout their course. Right Lower Extremity:The right common femoral artery is heavily  calcified with approximate 50% stenosis. The profunda origin is occluded. The superficial femoral artery is patent with tandem greater than 80% stenoses. The popliteal artery is patent throughout its course with three-vessel runoff. Left Lower Extremity:The common femoral artery at the level of inguinal ligament shows approximately a 70% stenosis. There is patulous dilatation of the left common femoral artery  consistent with prior endarterectomy. The profundofemoral arteries widely patent. Bypass graft is visualized with anastomosis to the above-knee popliteal artery which is widely patent. Peroneal artery is the dominant runoff.  Intervention:After the above images were acquired the decision made to proceed with intervention. A 7 French 45 cm sheath was advanced into the right external iliac artery. The patient was fully heparinized. Using a 035 Glidewire and quick cross, the lesions within the right superficial femoral artery were successfully crossed. A versa core wire was placed. I elected to primarily stent this area. Two 7 x 120 Elluvia overlapping stents were placed and molded with a 6 mm balloon. Completion imaging showed resolution of the stenosis and better opacification of the profundofemoral artery however it did appear occluded at its origin still. At this point, I felt surgical correction of the common femoral lesion was the next step and so the right side was aborted. The sheath was then withdrawn and further evaluation of the stenosis within the left common femoral artery was obtained. I elected to treat this with a 5 x 40 IMPACT drug-coated balloon. This is done for 2 minutes. There was a 70% stenosis which was down to less than 15% after treatment with no dissection. A minx was used for closure.  Impression: #1 Tandem near occlusive lesions in the right superficial femoral artery treated with primarily stenting using 7 x 120 overlapping Elluvia stents. After treatment, the profunda was better visualized however still appeared to be occluded at its origin. There is 50% minimum calcified stenosis within the right common femoral artery. #2 70% common femoral artery stenosis on the left treated using a 5 x 40 IMPACT balloon #3 The patient will be brought back to the office for discussions regarding right common femoral endarterectomy  with patch angioplasty and simultaneous stenting of the proximal right common iliac artery 50% stenosis.  Vascular ultrasound ABI with or without TBI 03/03/2020 Right ABIs and TBIs appear essentially unchanged compared to prior study on 01/26/2019. Left ABIs appear decreased compared to prior study on 01/26/2019. Left TBIs appear essentially unchanged compared to prior exam on 01/26/2019. Summary: Right: Resting right ankle-brachial index indicates moderate right lower extremity arterial disease. The right toe-brachial index is abnormal. RT great toe pressure = 76 mmHg. Left: Resting left ankle-brachial index indicates mild left lower extremity arterial disease. The left toebrachial index is normal. LT Great toe pressure = 114 mmHg.  Echocardiogram 08/01/2017. Study Conclusions   - Left ventricle: The cavity size was normal. Wall thickness was  normal. Systolic function was normal. The estimated ejection  fraction was in the range of 50% to 55%. Wall motion was normal;  there were no regional wall motion abnormalities. Doppler  parameters are consistent with abnormal left ventricular  relaxation (grade 1 diastolic dysfunction). Indeterminate filling  pressures.  - Aortic valve: Trileaflet; mildly thickened leaflets.  - Mitral valve: Calcified annulus. Normal thickness leaflets .  There was mild regurgitation.  - Atrial septum: No defect or patent foramen ovale was identified.   Impressions:  - When compared to the study dated 99991111, LV systolic function  has since normalized.    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  Near complete occlusion of the dominant left vertebral artery at its origin with retrograde opacification to the level of the near complete  occlusion from the ipsilateral ascending cervical branch of the left thyrocervical trunk.  Approximately 70-75% intra stent of the caval cavernous segment of the right internal carotid artery stenosis within the previously positioned Wings[place] stent.  Non opacification of the right anterior cerebral artery A1 segment. 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 CAD in native artery   2. Biventricular ICD (implantable cardioverter-defibrillator) in place   3. PAD (peripheral artery disease) ([place])   4. Tobacco abuse disorder   5. Essential hypertension   6. Mixed hyperlipidemia   7. Chronic systolic heart failure ([place])     1. CAD in native artery Denies any recent anginal or exertional symptoms.  Continue aspirin 81 mg daily, Brilinta 90 mg p.o. twice daily.  Carvedilol 3.125 mg p.o. twice daily.   2. Biventricular ICD (implantable cardioverter-defibrillator) in place  [place] ICD placed by Dr. Lovena Le 2016. Was seen by Dr. Lovena Le July 13, 2020 for follow-up and troubleshooting of his ICD.  He did develop some noise on his ICD lead.  Dr. Lovena Le reduced his sensitivity.  Recent device check showed he had 2.4 years of battery life left.  He carries a magnet with him due to history of lead noise in the event that he has ICD shock.  He states when AICD nears the end of battery life Dr. Lovena Le intends to replace the generator and the lead causing interference at the same time.  10/05/2020 remote device check reviewed. Histograms appropriate. Leads and battery stable.  EKG today shows AV sequential/dual-chamber electronic pacemaker rhythm rate of 60.  Denies any issues with his ICD other than pre-existing lead noise for which she carries his magnet with him.  Recent device check by Dr. Lovena Le on 04/05/2021 was normal.  3. PAD (peripheral artery disease) ([place]) 2015:  Bilateral iliac stenting  (claudication) 08-16-2017: Left femoral to above-knee popliteal bypass graft with ipsilateral saphenous vein 04/22/2018: Right superficial femoral artery atherectomy and drug-coated balloon angioplasty.  Angioplasty of right external iliac artery 03/08/2020: Right superficial femoral artery stent, drug-coated balloon angioplasty left common femoral artery 03/31/2020: Right iliofemoral endarterectomy with bovine patch angioplasty and right common iliac stent, right superficial femoral artery stent 08/23/2020: Drug-coated balloon angioplasty, left common femoral artery.  Stent right external iliac artery Saw Dr [name] 01/09/2021 . He was not having claudication symptoms and was continuing DAPT and statin. He was continuing to smoke. He denies any claudication-like symptoms.  States he needs to schedule a follow-up with Dr. [name].  4. Tobacco abuse disorder Continues to smoke.  Advised cessation.  Reinforced today need to stop smoking  5. Essential hypertension Blood pressure under control with blood pressure today 124/70 continue carvedilol 3.125 mg p.o. daily.  6. Mixed hyperlipidemia Continue atorvastatin 20 mg daily.   7.  Chronic systolic heart failure Last echocardiogram showed improvement in EF.  Echocardiogram 08/01/2017 demonstrated EF of 50 to 55%.  G1 DD.  Mild MR.  Continue Entresto 49/51 mg p.o. twice daily.  Carvedilol 3.125 mg p.o. twice daily.  Medication Adjustments/Labs and Tests Ordered: Current medicines are reviewed at length with the patient  today.  Concerns regarding medicines are outlined above.   Disposition: Follow-up with Dr. Domenic Polite 6 months  Signed, Levell July, NP 04/16/2021 7:07 PM    [facility], [place], [place] [address] Phone: [phone]; Fax: [phone]

## 2021-04-17 ENCOUNTER — Encounter: Payer: Self-pay | Admitting: Family Medicine

## 2021-04-17 ENCOUNTER — Ambulatory Visit (INDEPENDENT_AMBULATORY_CARE_PROVIDER_SITE_OTHER): Payer: Medicare Other | Admitting: Family Medicine

## 2021-04-17 VITALS — BP 124/70 | HR 60 | Ht 63.0 in | Wt 152.2 lb

## 2021-04-17 DIAGNOSIS — I5022 Chronic systolic (congestive) heart failure: Secondary | ICD-10-CM

## 2021-04-17 DIAGNOSIS — E782 Mixed hyperlipidemia: Secondary | ICD-10-CM

## 2021-04-17 DIAGNOSIS — I10 Essential (primary) hypertension: Secondary | ICD-10-CM

## 2021-04-17 DIAGNOSIS — I739 Peripheral vascular disease, unspecified: Secondary | ICD-10-CM | POA: Diagnosis not present

## 2021-04-17 DIAGNOSIS — Z720 Tobacco use: Secondary | ICD-10-CM

## 2021-04-17 DIAGNOSIS — I2510 Atherosclerotic heart disease of native coronary artery without angina pectoris: Secondary | ICD-10-CM | POA: Diagnosis not present

## 2021-04-17 DIAGNOSIS — Z95810 Presence of automatic (implantable) cardiac defibrillator: Secondary | ICD-10-CM

## 2021-04-17 NOTE — Addendum Note (Signed)
Addended by: Merlene Laughter on: 04/17/2021 04:03 PM   Modules accepted: Orders

## 2021-04-17 NOTE — Patient Instructions (Addendum)
Medication Instructions:   Your physician recommends that you continue on your current medications as directed. Please refer to the Current Medication list given to you today.  Labwork:  noine  Testing/Procedures:  none  Follow-Up:  Your physician recommends that you schedule a follow-up appointment in: 6 months. You will receive a reminder call or letter in the mail in about 4 months reminding you to call and schedule your appointment. If you don't receive this letter, please contact our office.  Any Other Special Instructions Will Be Listed Below (If Applicable).  If you need a refill on your cardiac medications before your next appointment, please call your pharmacy.

## 2021-04-27 NOTE — Progress Notes (Signed)
Remote ICD transmission.   

## 2021-05-11 DIAGNOSIS — I10 Essential (primary) hypertension: Secondary | ICD-10-CM | POA: Diagnosis not present

## 2021-05-11 DIAGNOSIS — N401 Enlarged prostate with lower urinary tract symptoms: Secondary | ICD-10-CM | POA: Diagnosis not present

## 2021-05-11 DIAGNOSIS — R7301 Impaired fasting glucose: Secondary | ICD-10-CM | POA: Diagnosis not present

## 2021-05-15 ENCOUNTER — Encounter: Payer: Self-pay | Admitting: Internal Medicine

## 2021-05-16 ENCOUNTER — Ambulatory Visit (INDEPENDENT_AMBULATORY_CARE_PROVIDER_SITE_OTHER): Payer: Medicare Other | Admitting: Internal Medicine

## 2021-05-16 ENCOUNTER — Encounter: Payer: Self-pay | Admitting: Internal Medicine

## 2021-05-16 ENCOUNTER — Other Ambulatory Visit: Payer: Self-pay

## 2021-05-16 VITALS — BP 132/66 | HR 66 | Ht 63.0 in | Wt 151.4 lb

## 2021-05-16 DIAGNOSIS — I5022 Chronic systolic (congestive) heart failure: Secondary | ICD-10-CM

## 2021-05-16 DIAGNOSIS — M25511 Pain in right shoulder: Secondary | ICD-10-CM | POA: Diagnosis not present

## 2021-05-16 DIAGNOSIS — Z95810 Presence of automatic (implantable) cardiac defibrillator: Secondary | ICD-10-CM

## 2021-05-16 DIAGNOSIS — I739 Peripheral vascular disease, unspecified: Secondary | ICD-10-CM | POA: Diagnosis not present

## 2021-05-16 DIAGNOSIS — I255 Ischemic cardiomyopathy: Secondary | ICD-10-CM | POA: Diagnosis not present

## 2021-05-16 DIAGNOSIS — I671 Cerebral aneurysm, nonruptured: Secondary | ICD-10-CM | POA: Diagnosis not present

## 2021-05-16 DIAGNOSIS — F17210 Nicotine dependence, cigarettes, uncomplicated: Secondary | ICD-10-CM | POA: Diagnosis not present

## 2021-05-16 DIAGNOSIS — D509 Iron deficiency anemia, unspecified: Secondary | ICD-10-CM | POA: Diagnosis not present

## 2021-05-16 DIAGNOSIS — R7303 Prediabetes: Secondary | ICD-10-CM | POA: Diagnosis not present

## 2021-05-16 DIAGNOSIS — K7460 Unspecified cirrhosis of liver: Secondary | ICD-10-CM | POA: Diagnosis not present

## 2021-05-16 DIAGNOSIS — E875 Hyperkalemia: Secondary | ICD-10-CM | POA: Diagnosis not present

## 2021-05-16 DIAGNOSIS — M545 Low back pain, unspecified: Secondary | ICD-10-CM | POA: Diagnosis not present

## 2021-05-16 DIAGNOSIS — E782 Mixed hyperlipidemia: Secondary | ICD-10-CM | POA: Diagnosis not present

## 2021-05-16 DIAGNOSIS — I10 Essential (primary) hypertension: Secondary | ICD-10-CM | POA: Diagnosis not present

## 2021-05-16 NOTE — Patient Instructions (Signed)
Medication Instructions:  Your physician recommends that you continue on your current medications as directed. Please refer to the Current Medication list given to you today.  *If you need a refill on your cardiac medications before your next appointment, please call your pharmacy*   Lab Work: NONE   If you have labs (blood work) drawn today and your tests are completely normal, you will receive your results only by: . MyChart Message (if you have MyChart) OR . A paper copy in the mail If you have any lab test that is abnormal or we need to change your treatment, we will call you to review the results.   Testing/Procedures: NONE    Follow-Up: At [facility], you and your health needs are our priority.  As part of our continuing mission to provide you with exceptional heart care, we have created designated Provider Care Teams.  These Care Teams include your primary Cardiologist (physician) and Advanced Practice Providers (APPs -  Physician Assistants and Nurse Practitioners) who all work together to provide you with the care you need, when you need it.  We recommend signing up for the patient portal called "MyChart".  Sign up information is provided on this After Visit Summary.  MyChart is used to connect with patients for Virtual Visits (Telemedicine).  Patients are able to view lab/test results, encounter notes, upcoming appointments, etc.  Non-urgent messages can be sent to your provider as well.   To learn more about what you can do with MyChart, go to [email or web address].    Your next appointment:   1 year(s)  The format for your next appointment:   In Person  Provider:   Gregg Taylor, MD   Other Instructions Thank you for choosing [facility]!    

## 2021-05-16 NOTE — Progress Notes (Signed)
HPI Roy [name] returns today for followup. He is a pleasant 75 yo man with a h/o chronic systolic heart failure, s/p biv ICD insertion. He has had some noise on his ICD lead and we have reprogrammed his device. He has not had syncope. When we saw him last 3 months ago, we gave him a magnet to keep with him. He has not had chest pain or sob.  No Known Allergies   Current Outpatient Medications  Medication Sig Dispense Refill   acetaminophen (TYLENOL) 500 MG tablet Take 1,000 mg by mouth as needed for moderate pain or headache.      aspirin EC 81 MG tablet Take 81 mg by mouth every evening.      atorvastatin (LIPITOR) 20 MG tablet Take 20 mg by mouth daily.      BRILINTA 90 MG TABS tablet Take 90 mg by mouth 2 (two) times daily.     carvedilol (COREG) 3.125 MG tablet TAKE 1 TABLET TWICE A DAY (Patient taking differently: Take 3.125 mg by mouth 2 (two) times daily.) 180 tablet 0   dextromethorphan-guaiFENesin (MUCINEX DM) 30-600 MG 12hr tablet Take 1 tablet by mouth daily.      ENTRESTO 49-51 MG TAKE 1 TABLET TWICE A DAY (Patient taking differently: Take 1 tablet by mouth in the morning and at bedtime.) 180 tablet 4   gabapentin (NEURONTIN) 300 MG capsule Take 300 mg by mouth 2 (two) times daily.      magnesium oxide (MAG-OX) 400 MG tablet Take 400 mg by mouth daily.     Multiple Vitamins-Minerals (CENTRUM SILVER PO) Take 1 tablet by mouth daily. 50+     Omega-3 Fatty Acids (FISH OIL) 1000 MG CAPS Take 1,000 mg by mouth daily.     oxyCODONE-acetaminophen (PERCOCET/ROXICET) 5-325 MG tablet Take 1 tablet by mouth daily as needed for moderate pain.      SUPER B COMPLEX/C PO Take 1 tablet by mouth at bedtime.      tamsulosin (FLOMAX) 0.4 MG CAPS capsule Take 0.4 mg by mouth 2 (two) times daily.      traMADol (ULTRAM) 50 MG tablet Take 50 mg by mouth daily as needed for moderate pain.      No current facility-administered medications for this visit.     Past Medical History:  Diagnosis  Date   AICD (automatic cardioverter/defibrillator) present 08/19/2015   [employer] Jude BiV ICD for primary prevention by Dr. Lovena Le   Alcohol abuse    6 beers per day; hospital admission in 2009 for withdrawal symptoms   Alcoholic cirrhosis ([place])    Anxiety and depression    denies    Cerebrovascular disease 2009   TIA; 2009- right ICA stent; re-intervention for restenosis complicated by [facility] w/o sx   CHF (congestive heart failure) ([place]) 06-02-14   Chronic obstructive pulmonary disease ([place])    Degenerative joint disease    Total shoulder arthroplasty-right   Hyperlipidemia    Hypertension    LBBB (left bundle branch block)    Normal echo-2011; stress nuclear in 09/2010--septal hypoperfusion representing nontransmural infarction or the effect of left bundle branch block, no ischemia   Myocardial infarction [facility]) June 02, 2014   Massive Heart Attack   Peripheral vascular disease ([place])    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   Thrombocytopenia (H[facility])    Tobacco abuse    -100 pack years; 1.5 packs per day   Traumatic seroma of thigh ([place])    left  Tubular adenoma of colon     ROS:   All systems reviewed and negative except as noted in the HPI.   Past Surgical History:  Procedure Laterality Date   ABDOMINAL AORTAGRAM [name] 05/04/2014   Procedure: ABDOMINAL AORTAGRAM;  Surgeon: [name], MD;  Location[facility] CATH LAB;  Service: Cardiovascular;  Laterality: [name];   ABDOMINAL AORTOGRAM [name] 06/25/2017   Procedure: Abdominal Aortogram;  Surgeon: [name], MD;  Location: [place] CV LAB;  Service: Cardiovascular;  Laterality: [name];   ABDOMINAL AORTOGRAM W/LOWER EXTREMITY [name] 04/22/2018   Procedure: ABDOMINAL AORTOGRAM W/LOWER EXTREMITY;  Surgeon: [name], MD;  Location: [place] CV LAB;  Service: Cardiovascular;  Laterality: [name];   ABDOMINAL AORTOGRAM W/LOWER EXTREMITY [name] 03/08/2020   Procedure: ABDOMINAL AORTOGRAM W/LOWER EXTREMITY;  Surgeon: [name],  MD;  Location: [place] CV LAB;  Service: Cardiovascular;  Laterality: [name];   ABDOMINAL AORTOGRAM W/LOWER EXTREMITY Bilateral 08/23/2020   Procedure: ABDOMINAL AORTOGRAM W/LOWER EXTREMITY;  Surgeon: [name], MD;  Location: [place] CV LAB;  Service: Cardiovascular;  Laterality: Bilateral;   AGILE CAPSULE [name] 03/18/2014   Procedure: AGILE CAPSULE;  Surgeon: Danie Binder, MD;  [facility];  Service: Endoscopy;  Laterality: [name];  4:23   APPLICATION OF WOUND VAC Left 09/03/2017   Procedure: APPLICATION OF WOUND VAC;  Surgeon: Conrad [place], MD;  Location: [place];  Service: Vascular;  Laterality: Left;   APPLICATION OF WOUND VAC Right 04/07/2020   Procedure: PLACEMENT OF ANTIBIOTIC BEADS AND APPLICATION OF WOUND VAC;  Surgeon: [name], MD;  [facility];  Service: Vascular;  Laterality: Right;   BACK SURGERY     BACTERIAL OVERGROWTH TEST [name] 05/24/2015   Procedure: BACTERIAL OVERGROWTH TEST;  Surgeon: Danie Binder, MD;  [facility];  Service: Endoscopy;  Laterality: [name];  0700   BI-VENTRICULAR IMPLANTABLE CARDIOVERTER DEFIBRILLATOR  (CRT-D)  08/19/2015   CARPAL TUNNEL RELEASE Left 02/02/2016   Procedure: LEFT CARPAL TUNNEL RELEASE;  Surgeon: Daryll Brod, MD;  Location: [place];  Service: Orthopedics;  Laterality: Left;  ANESTHESIA: IV REGIONAL UPPER ARM   CATARACT EXTRACTION W/PHACO Right 03/08/2015   Procedure: CATARACT EXTRACTION PHACO AND INTRAOCULAR LENS PLACEMENT (IOC);  Surgeon: Rutherford Guys, MD;  Location: [facility];  Service: Ophthalmology;  Laterality: Right;  CDE:9.46   CATARACT EXTRACTION W/PHACO Left 03/22/2015   Procedure: CATARACT EXTRACTION PHACO AND INTRAOCULAR LENS PLACEMENT (IOC);  Surgeon: Rutherford Guys, MD;  [facility]: [facility];  Service: Ophthalmology;  Laterality: Left;  CDE:5.80   COLONOSCOPY  08/22/09   Fields-(Tubular Adenoma)3-mm transverse polyp/4-mm polyp otherwise noraml/small internal hemorrhoids   COLONOSCOPY [name]  04/14/2014   NTI:RWERX internal hemorrhids/normal mocsa in the terminal iluem/left colonis redundant   COLONOSCOPY W/ POLYPECTOMY  2011   ENDARTERECTOMY FEMORAL Left 08/16/2017   Procedure: ENDARTERECTOMY LEFT PROFUNDA FEMORAL;  Surgeon: [name], MD;  Location: [facility];  Service: Vascular;  Laterality: Left;   ENDARTERECTOMY FEMORAL Right 03/31/2020   Procedure: RIGHT FEMORAL ENDARTERECTOMY;  Surgeon: [name], MD;  Location: [place];  Service: Vascular;  Laterality: Right;   EP IMPLANTABLE DEVICE [name] 08/19/2015   Procedure: BiV ICD Insertion CRT-D;  Surgeon: Evans Lance, MD;  Location: [place] CV LAB;  Service: Cardiovascular;  Laterality: [name];   ESOPHAGOGASTRODUODENOSCOPY [name] 03/05/2014   SLF: 1. Stricture at the gastroesophagael junction 2. Small hiatal hernia 3. Moderate non-erosive gastritis and duodentitis. 4. No surce for Melena identified.  FEMORAL-POPLITEAL BYPASS GRAFT Left 08/16/2017   Procedure: LEFT  FEMORAL-POPLITEAL ARTERY  BYPASS GRAFT;  Surgeon: [name], MD;  Location: [place];  Service: Vascular;  Laterality: Left;   GIVENS CAPSULE STUDY [name] 03/30/2014   Procedure: GIVENS CAPSULE STUDY;  Surgeon: Danie Binder, MD;  [facility];  Service: Endoscopy;  Laterality: [name];  7:30   GROIN DEBRIDEMENT Right 04/07/2020   Procedure: INCISION AND DRAINAGE OF RIGHT GROIN;  Surgeon: [name], MD;  [facility];  Service: Vascular;  Laterality: Right;   I & D EXTREMITY Left 09/03/2017   Procedure: IRRIGATION AND DEBRIDEMENT EXTREMITY LEFT THIGH SEROMA;  Surgeon: Conrad Iberia, MD;  Location: [place];  Service: Vascular;  Laterality: Left;   INSERT / REPLACE / [place]  03/31/2020   Procedure: INSERTION OF RIGHT ILIAC ARTERY STENT AND RIGHT SUPERFICIAL FEMORAL ARTERY STENT;  Surgeon: [name], MD;  [facility];  Service: Vascular;;   IR ANGIO INTRA EXTRACRAN SEL COM CAROTID INNOMINATE BILAT MOD SED   07/16/2017   IR ANGIO INTRA EXTRACRAN SEL COM CAROTID INNOMINATE BILAT MOD SED  06/12/2019   IR ANGIO INTRA EXTRACRAN SEL COM CAROTID INNOMINATE BILAT MOD SED  01/17/2021   IR ANGIO VERTEBRAL SEL SUBCLAVIAN INNOMINATE BILAT MOD SED  07/16/2017   IR ANGIO VERTEBRAL SEL SUBCLAVIAN INNOMINATE BILAT MOD SED  06/12/2019   IR ANGIO VERTEBRAL SEL SUBCLAVIAN INNOMINATE BILAT MOD SED  01/17/2021   IR RADIOLOGIST EVAL & MGMT  04/01/2017   IR RADIOLOGIST EVAL & MGMT  08/02/2017   IR TRANSCATH EXCRAN VERT OR CAR A STENT  07/22/2019   IR US GUIDE VASC ACCESS RIGHT  06/12/2019   IR US GUIDE VASC ACCESS RIGHT  01/17/2021   JOINT REPLACEMENT Right    Total Shoulder Replacement    LOWER EXTREMITY ANGIOGRAPHY Bilateral 06/25/2017   Procedure: Lower Extremity Angiography;  Surgeon: [name], MD;  Location: [place] CV LAB;  Service: Cardiovascular;  Laterality: Bilateral;   LUMBAR FUSION  2010   PATCH ANGIOPLASTY Right 03/31/2020   Procedure: PATCH ANGIOPLASTY USING XENOSURE BIOLOGIC PATCH 1cm x 14cm;  Surgeon: [name], MD;  Location: [facility] OR;  Service: Vascular;  Laterality: Right;   PERIPHERAL VASCULAR ATHERECTOMY Right 04/22/2018   Procedure: PERIPHERAL VASCULAR ATHERECTOMY;  Surgeon: [name], MD;  Location: [place] CV LAB;  Service: Cardiovascular;  Laterality: Right;  right superficial femoral   PERIPHERAL VASCULAR BALLOON ANGIOPLASTY Right 04/22/2018   Procedure: PERIPHERAL VASCULAR BALLOON ANGIOPLASTY;  Surgeon: [name], MD;  Location: [place] CV LAB;  Service: Cardiovascular;  Laterality: Right;  external iliac   PERIPHERAL VASCULAR BALLOON ANGIOPLASTY Left 03/08/2020   Procedure: PERIPHERAL VASCULAR BALLOON ANGIOPLASTY;  Surgeon: [name], MD;  Location: [place] CV LAB;  Service: Cardiovascular;  Laterality: Left;  Common Femoral   PERIPHERAL VASCULAR BALLOON ANGIOPLASTY Left 08/23/2020   Procedure: PERIPHERAL VASCULAR BALLOON ANGIOPLASTY;  Surgeon: [name], MD;  Location: [place] CV LAB;  Service: Cardiovascular;  Laterality: Left;  common femoral   PERIPHERAL VASCULAR INTERVENTION Right 03/08/2020   Procedure: PERIPHERAL VASCULAR INTERVENTION;  Surgeon: [name], MD;  Location: [place] CV LAB;  Service: Cardiovascular;  Laterality: Right;  SFA   PERIPHERAL VASCULAR INTERVENTION Right 08/23/2020   Procedure: PERIPHERAL VASCULAR INTERVENTION;  Surgeon: [name], MD;  Location: [place] CV LAB;  Service: Cardiovascular;  Laterality: Right;  external iliac   RADIOLOGY WITH ANESTHESIA [name] 07/01/2019   Procedure: STENTING;  Surgeon: Luanne Bras, MD;  Location: [place];  Service: Radiology;  Laterality: [name];   RADIOLOGY WITH ANESTHESIA [name] 07/22/2019   Procedure: STENTING;  Surgeon: Luanne Bras, MD;  Location: [place];  Service: Radiology;  Laterality: [name];   TOTAL SHOULDER ARTHROPLASTY Right 2011   Dr. Rafael Bihari NERVE TRANSPOSITION Left 02/02/2016   Procedure: LEFT IN-SITU DECOMPRESSION ULNAR NERVE ;  Surgeon: Daryll Brod, MD;  Location: [place];  Service: Orthopedics;  Laterality: Left;   ULNAR TUNNEL RELEASE Left 02/02/2016   Procedure: LEFT CUBITAL TUNNEL RELEASE;  Surgeon: Daryll Brod, MD;  Location: [place];  Service: Orthopedics;  Laterality: Left;   VASECTOMY  1971   VEIN HARVEST Left 08/16/2017   Procedure: USING NON REVERSE LEFT GREATER SAPHENOUS VEIN HARVEST;  Surgeon: [name], MD;  [facility];  Service: Vascular;  Laterality: Left;     Family History  Problem Relation Age of Onset   Coronary artery disease Mother    Diabetes Mother    Heart disease Mother        Before age 53 - 27 Bypasses   Hypertension Mother    Heart attack Mother        3-4 Heart attacks   Alzheimer's disease Father    Diabetes Father    Heart attack Sister    Cancer Brother        Prostate   Hyperlipidemia Son    Prostate cancer Brother    Alzheimer's disease Sister     Colon cancer Neg Hx      Social History   Socioeconomic History   Marital status: Married    Spouse name: Not on file   Number of children: 2   Years of education: Not on file   Highest education level: Not on file  Occupational History   Occupation: retired, Clinical biochemist    Comment: Programmer, systems: RETIRED  Tobacco Use   Smoking status: Every Day    Packs/day: 1.00    Years: 60.00    Pack years: 60.00    Types: Cigarettes    Start date: 08/20/1957   Smokeless tobacco: Never  Vaping Use   Vaping Use: Former   Start date: 09/10/2016   Quit date: 10/11/2016  Substance and Sexual Activity   Alcohol use: No    Alcohol/week: 0.0 standard drinks    Comment: quit in July 2015   Drug use: No   Sexual activity: Not Currently  Other Topics Concern   Not on file  Social History Narrative   Accompanied by daughter Roy [name]   Lives w/ wife, daughter   Social Determinants of Health   Financial Resource Strain: Not on file  Food Insecurity: Not on file  Transportation Needs: Not on file  Physical Activity: Not on file  Stress: Not on file  Social Connections: Not on file  Intimate Partner Violence: Not on file     Ht 5\' 3"  (1.6 m)   BMI 26.96 kg/m   Physical Exam:  Well appearing NAD HEENT: Unremarkable Neck:  No JVD, no thyromegally Lymphatics:  No adenopathy Back:  No CVA tenderness Lungs:  Clear HEART:  Regular rate rhythm, no murmurs, no rubs, no clicks Abd:  soft, positive bowel sounds, no organomegally, no rebound, no guarding Ext:  2 plus pulses, no edema, no cyanosis, no clubbing Skin:  No rashes no nodules Neuro:  CN II through XII intact, motor grossly intact  EKG  DEVICE  Normal device function.  See PaceArt for details.   Assess/Plan:  1. ICD lead noise - he has been stable over the past 6 months and I will have him return in12 months for additional followup. He has a magnet that he is instructed to use if he were to receive a  shock due to lead noise. 2. Chronic systolic heart failure - his symptoms are class 2. No change. 3. CAD - he has not had any anginal symptoms. 4. Tobacco abuse - He is encouraged to stop smoking. He is still smoking a ppd.   Carleene Overlie Tzion Wedel,MD

## 2021-06-27 ENCOUNTER — Other Ambulatory Visit: Payer: Self-pay

## 2021-06-27 ENCOUNTER — Encounter: Payer: Self-pay | Admitting: Internal Medicine

## 2021-06-27 ENCOUNTER — Ambulatory Visit (INDEPENDENT_AMBULATORY_CARE_PROVIDER_SITE_OTHER): Payer: Medicare Other | Admitting: Internal Medicine

## 2021-06-27 VITALS — BP 139/73 | HR 71 | Temp 97.7°F | Ht 63.0 in | Wt 150.4 lb

## 2021-06-27 DIAGNOSIS — I255 Ischemic cardiomyopathy: Secondary | ICD-10-CM

## 2021-06-27 DIAGNOSIS — F1021 Alcohol dependence, in remission: Secondary | ICD-10-CM | POA: Diagnosis not present

## 2021-06-27 NOTE — Progress Notes (Signed)
Primary Care Physician:  Celene Squibb, MD Primary Gastroenterologist:  Dr. Gala Romney  Pre-Procedure History & Physical: HPI:  Roy [name] is a 75 y.o. male here for follow-up.  History of alcohol use disorder with no alcohol consumption since 2015.  History of myocardial infarction, PAD.  Cardiomyopathy for which she has AICD/pacer. Prior elastography demonstrated F2 0/F1 which is basically normal.,  INR and platelet count good earlier this year.  Clinically, no stigmata of chronic liver disease. Non-H. pylori gastritis and no stigmata of portal hypertension (no varices or portal gastropathy) at prior EGD. Negative colonoscopy 2015. LFTs have been repeatedly normal over the past year.  Lately counts have remained normal.  Past Medical History:  Diagnosis Date   AICD (automatic cardioverter/defibrillator) present 08/19/2015   St Jude BiV ICD for primary prevention by Dr. Lovena Le   Alcohol abuse    6 beers per day; hospital admission in 2009 for withdrawal symptoms   Alcoholic cirrhosis ([place])    Anxiety and depression    denies    Cerebrovascular disease 2009   TIA; 2009- right ICA stent; re-intervention for restenosis complicated by [facility] w/o sx   CHF (congestive heart failure) ([place]) 06-02-14   Chronic obstructive pulmonary disease (HCC)    Degenerative joint disease    Total shoulder arthroplasty-right   Hyperlipidemia    Hypertension    LBBB (left bundle branch block)    Normal echo-2011; stress nuclear in 09/2010--septal hypoperfusion representing nontransmural infarction or the effect of left bundle branch block, no ischemia   Myocardial infarction [facility]) June 02, 2014   Massive Heart Attack   Peripheral vascular disease ([place])    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   Thrombocytopenia (HCC)    Tobacco abuse    -100 pack years; 1.5 packs per day   Traumatic seroma of thigh (HCC)    left   Tubular adenoma of colon     Past Surgical History:  Procedure Laterality  Date   ABDOMINAL AORTAGRAM [name] 05/04/2014   Procedure: ABDOMINAL Maxcine Ham;  Surgeon: [name], MD;  Location: [facility] CATH LAB;  Service: Cardiovascular;  Laterality: [name];   ABDOMINAL AORTOGRAM [name] 06/25/2017   Procedure: Abdominal Aortogram;  Surgeon: [name], MD;  Location: [place] CV LAB;  Service: Cardiovascular;  Laterality: [name];   ABDOMINAL AORTOGRAM W/LOWER EXTREMITY [name] 04/22/2018   Procedure: ABDOMINAL AORTOGRAM W/LOWER EXTREMITY;  Surgeon: [name], MD;  Location: [place] CV LAB;  Service: Cardiovascular;  Laterality: [name];   ABDOMINAL AORTOGRAM W/LOWER EXTREMITY [name] 03/08/2020   Procedure: ABDOMINAL AORTOGRAM W/LOWER EXTREMITY;  Surgeon: [name], MD;  Location: [place] CV LAB;  Service: Cardiovascular;  Laterality: [name];   ABDOMINAL AORTOGRAM W/LOWER EXTREMITY Bilateral 08/23/2020   Procedure: ABDOMINAL AORTOGRAM W/LOWER EXTREMITY;  Surgeon: [name], MD;  Location: [place] CV LAB;  Service: Cardiovascular;  Laterality: Bilateral;   AGILE CAPSULE [name] 03/18/2014   Procedure: AGILE CAPSULE;  Surgeon: Danie Binder, MD;  [facility] END[facility];  Service: Endoscopy;  Laterality: [name];  [id number]   APPLICATION OF WOUND VAC Left 09/03/2017   Procedure: APPLICATION OF WOUND VAC;  Surgeon: Conrad [place], MD;  Location: [place];  Service: Vascular;  Laterality: Left;   APPLICATION OF WOUND VAC Right 04/07/2020   Procedure: PLACEMENT OF ANTIBIOTIC BEADS AND APPLICATION OF WOUND VAC;  Surgeon: [name], MD;  Location: [place];  Service: Vascular;  Laterality: Right;   BACK SURGERY  BACTERIAL OVERGROWTH TEST [name] 05/24/2015   Procedure: BACTERIAL OVERGROWTH TEST;  Surgeon: Danie Binder, MD;  [facility];  Service: Endoscopy;  Laterality: [name];  0700   BI-VENTRICULAR IMPLANTABLE CARDIOVERTER DEFIBRILLATOR  (CRT-D)  08/19/2015   CARPAL TUNNEL RELEASE Left 02/02/2016   Procedure: LEFT CARPAL TUNNEL RELEASE;  Surgeon: Daryll Brod, MD;  Location:  [place];  Service: Orthopedics;  Laterality: Left;  ANESTHESIA: IV REGIONAL UPPER ARM   CATARACT EXTRACTION W/PHACO Right 03/08/2015   Procedure: CATARACT EXTRACTION PHACO AND INTRAOCULAR LENS PLACEMENT (IOC);  Surgeon: Rutherford Guys, MD;  Location: [facility] ORS;  Service: Ophthalmology;  Laterality: Right;  CDE:9.46   CATARACT EXTRACTION W/PHACO Left 03/22/2015   Procedure: CATARACT EXTRACTION PHACO AND INTRAOCULAR LENS PLACEMENT (IOC);  Surgeon: Rutherford Guys, MD;  Location: [facility] ORS;  Service: Ophthalmology;  Laterality: Left;  CDE:5.80   COLONOSCOPY  08/22/09   Fields-(Tubular Adenoma)3-mm transverse polyp/4-mm polyp otherwise noraml/small internal hemorrhoids   COLONOSCOPY [name] 04/14/2014   [id number] internal hemorrhids/normal mocsa in the terminal iluem/left colonis redundant   COLONOSCOPY W/ POLYPECTOMY  2011   ENDARTERECTOMY FEMORAL Left 08/16/2017   Procedure: ENDARTERECTOMY LEFT PROFUNDA FEMORAL;  Surgeon: [name], MD;  [facility] OR;  Service: Vascular;  Laterality: Left;   ENDARTERECTOMY FEMORAL Right 03/31/2020   Procedure: RIGHT FEMORAL ENDARTERECTOMY;  Surgeon: [name], MD;  Location: [place];  Service: Vascular;  Laterality: Right;   EP IMPLANTABLE DEVICE [name] 08/19/2015   Procedure: BiV ICD Insertion CRT-D;  Surgeon: Evans Lance, MD;  Location: [place] CV LAB;  Service: Cardiovascular;  Laterality: [name];   ESOPHAGOGASTRODUODENOSCOPY [name] 03/05/2014   SLF: 1. Stricture at the gastroesophagael junction 2. Small hiatal hernia 3. Moderate non-erosive gastritis and duodentitis. 4. No surce for Melena identified.    FEMORAL-POPLITEAL BYPASS GRAFT Left 08/16/2017   Procedure: LEFT  FEMORAL-POPLITEAL ARTERY  BYPASS GRAFT;  Surgeon: [name], MD;  Location: [place];  Service: Vascular;  Laterality: Left;   GIVENS CAPSULE STUDY [name] 03/30/2014   Procedure: GIVENS CAPSULE STUDY;  Surgeon: Danie Binder, MD;  [facility];  Service: Endoscopy;  Laterality:  [name];  7:30   GROIN DEBRIDEMENT Right 04/07/2020   Procedure: INCISION AND DRAINAGE OF RIGHT GROIN;  Surgeon: [name], MD;  [facility] OR;  Service: Vascular;  Laterality: Right;   I & D EXTREMITY Left 09/03/2017   Procedure: IRRIGATION AND DEBRIDEMENT EXTREMITY LEFT THIGH SEROMA;  Surgeon: Conrad Elma, MD;  Location: [place];  Service: Vascular;  Laterality: Left;   INSERT / REPLACE / [place]  03/31/2020   Procedure: INSERTION OF RIGHT ILIAC ARTERY STENT AND RIGHT SUPERFICIAL FEMORAL ARTERY STENT;  Surgeon: [name], MD;  [facility] OR;  Service: Vascular;;   IR ANGIO INTRA EXTRACRAN SEL COM CAROTID INNOMINATE BILAT MOD SED  07/16/2017   IR ANGIO INTRA EXTRACRAN SEL COM CAROTID INNOMINATE BILAT MOD SED  06/12/2019   IR ANGIO INTRA EXTRACRAN SEL COM CAROTID INNOMINATE BILAT MOD SED  01/17/2021   IR ANGIO VERTEBRAL SEL SUBCLAVIAN INNOMINATE BILAT MOD SED  07/16/2017   IR ANGIO VERTEBRAL SEL SUBCLAVIAN INNOMINATE BILAT MOD SED  06/12/2019   IR ANGIO VERTEBRAL SEL SUBCLAVIAN INNOMINATE BILAT MOD SED  01/17/2021   IR RADIOLOGIST EVAL & MGMT  04/01/2017   IR RADIOLOGIST EVAL & MGMT  08/02/2017   IR TRANSCATH EXCRAN VERT OR CAR A STENT  07/22/2019  IR US GUIDE VASC ACCESS RIGHT  06/12/2019   IR US GUIDE VASC ACCESS RIGHT  01/17/2021   JOINT REPLACEMENT Right    Total Shoulder Replacement    LOWER EXTREMITY ANGIOGRAPHY Bilateral 06/25/2017   Procedure: Lower Extremity Angiography;  Surgeon: [name], MD;  Location: [place] CV LAB;  Service: Cardiovascular;  Laterality: Bilateral;   LUMBAR FUSION  2010   PATCH ANGIOPLASTY Right 03/31/2020   Procedure: PATCH ANGIOPLASTY USING XENOSURE BIOLOGIC PATCH 1cm x 14cm;  Surgeon: [name], MD;  [facility];  Service: Vascular;  Laterality: Right;   PERIPHERAL VASCULAR ATHERECTOMY Right 04/22/2018   Procedure: PERIPHERAL VASCULAR ATHERECTOMY;  Surgeon: [name], MD;  Location: [place]  CV LAB;  Service: Cardiovascular;  Laterality: Right;  right superficial femoral   PERIPHERAL VASCULAR BALLOON ANGIOPLASTY Right 04/22/2018   Procedure: PERIPHERAL VASCULAR BALLOON ANGIOPLASTY;  Surgeon: [name], MD;  Location: [place] CV LAB;  Service: Cardiovascular;  Laterality: Right;  external iliac   PERIPHERAL VASCULAR BALLOON ANGIOPLASTY Left 03/08/2020   Procedure: PERIPHERAL VASCULAR BALLOON ANGIOPLASTY;  Surgeon: [name], MD;  Location: [place] CV LAB;  Service: Cardiovascular;  Laterality: Left;  Common Femoral   PERIPHERAL VASCULAR BALLOON ANGIOPLASTY Left 08/23/2020   Procedure: PERIPHERAL VASCULAR BALLOON ANGIOPLASTY;  Surgeon: [name], MD;  Location: [place] CV LAB;  Service: Cardiovascular;  Laterality: Left;  common femoral   PERIPHERAL VASCULAR INTERVENTION Right 03/08/2020   Procedure: PERIPHERAL VASCULAR INTERVENTION;  Surgeon: [name], MD;  Location: [place] CV LAB;  Service: Cardiovascular;  Laterality: Right;  SFA   PERIPHERAL VASCULAR INTERVENTION Right 08/23/2020   Procedure: PERIPHERAL VASCULAR INTERVENTION;  Surgeon: [name], MD;  Location: [place] CV LAB;  Service: Cardiovascular;  Laterality: Right;  external iliac   RADIOLOGY WITH ANESTHESIA [name] 07/01/2019   Procedure: STENTING;  Surgeon: Luanne Bras, MD;  Location: [place];  Service: Radiology;  Laterality: [name];   RADIOLOGY WITH ANESTHESIA [name] 07/22/2019   Procedure: STENTING;  Surgeon: Luanne Bras, MD;  Location: [place];  Service: Radiology;  Laterality: [name];   TOTAL SHOULDER ARTHROPLASTY Right 2011   Dr. Rafael Bihari NERVE TRANSPOSITION Left 02/02/2016   Procedure: LEFT IN-SITU DECOMPRESSION ULNAR NERVE ;  Surgeon: Daryll Brod, MD;  Location: [place];  Service: Orthopedics;  Laterality: Left;   ULNAR TUNNEL RELEASE Left 02/02/2016   Procedure: LEFT CUBITAL TUNNEL RELEASE;  Surgeon: Daryll Brod, MD;  Location: [place];  Service: Orthopedics;  Laterality: Left;   VASECTOMY  1971   VEIN HARVEST Left 08/16/2017   Procedure: USING NON REVERSE LEFT GREATER SAPHENOUS VEIN HARVEST;  Surgeon: [name], MD;  [facility];  Service: Vascular;  Laterality: Left;    Prior to Admission medications   Medication Sig Start Date End Date Taking? Authorizing Provider  acetaminophen (TYLENOL) 500 MG tablet Take 1,000 mg by mouth as needed for moderate pain or headache.    Yes [provider]  aspirin EC 81 MG tablet Take 81 mg by mouth every evening.    Yes [provider]  atorvastatin (LIPITOR) 20 MG tablet Take 20 mg by mouth daily.    Yes [provider]  BRILINTA 90 MG TABS tablet Take 90 mg by mouth 2 (two) times daily. 07/01/19  Yes [provider]  carvedilol (COREG) 3.125 MG tablet TAKE 1 TABLET TWICE A DAY 04/21/19  Yes Herminio Commons, MD  dextromethorphan-guaiFENesin (MUCINEX DM) 30-600 MG 12hr tablet Take 1 tablet by mouth daily.    Yes [provider]  ENTRESTO 49-51 MG TAKE 1 TABLET TWICE A DAY 10/27/18  Yes Herminio Commons, MD  gabapentin (NEURONTIN) 300 MG capsule Take 300 mg by mouth 2 (two) times daily.  06/14/15  Yes [provider]  magnesium oxide (MAG-OX) 400 MG tablet Take 400 mg by mouth daily.   Yes [provider]  Multiple Vitamins-Minerals (CENTRUM SILVER PO) Take 1 tablet by mouth daily. 50+   Yes [provider]  Omega-3 Fatty Acids (FISH OIL) 1000 MG CAPS Take 2,000 mg by mouth daily.   Yes [provider]  oxyCODONE-acetaminophen (PERCOCET/ROXICET) 5-325 MG tablet Take 1 tablet by mouth daily as needed for moderate pain.  07/05/20  Yes [provider]  SUPER B COMPLEX/C PO Take 1 tablet by mouth at bedtime.    Yes [provider]  tamsulosin (FLOMAX) 0.4 MG CAPS capsule Take 0.4 mg by mouth 2 (two) times daily.    Yes [provider]  traMADol (ULTRAM) 50 MG tablet  Take 50 mg by mouth daily as needed for moderate pain.  04/24/20  Yes [provider]    Allergies as of 06/27/2021   (No Known Allergies)    Family History  Problem Relation Age of Onset   Coronary artery disease Mother    Diabetes Mother    Heart disease Mother        Before age 51 - 87 Bypasses   Hypertension Mother    Heart attack Mother        3-4 Heart attacks   Alzheimer's disease Father    Diabetes Father    Heart attack Sister    Cancer Brother        Prostate   Hyperlipidemia Son    Prostate cancer Brother    Alzheimer's disease Sister    Colon cancer Neg Hx     Social History   Socioeconomic History   Marital status: Married    Spouse name: Not on file   Number of children: 2   Years of education: Not on file   Highest education level: Not on file  Occupational History   Occupation: retired, Clinical biochemist    Comment: Programmer, systems: RETIRED  Tobacco Use   Smoking status: Every Day    Packs/day: 1.00    Years: 60.00    Pack years: 60.00    Types: Cigarettes    Start date: 08/20/1957   Smokeless tobacco: Never  Vaping Use   Vaping Use: Former   Start date: 09/10/2016   Quit date: 10/11/2016  Substance and Sexual Activity   Alcohol use: No    Alcohol/week: 0.0 standard drinks    Comment: quit in July 2015   Drug use: No   Sexual activity: Not Currently  Other Topics Concern   Not on file  Social History Narrative   Accompanied by daughter [name]   Lives w/ wife, daughter   Social Determinants of Health   Financial Resource Strain: Not on file  Food Insecurity: Not on file  Transportation Needs: Not on file  Physical Activity: Not on file  Stress: Not on file  Social Connections: Not on file  Intimate Partner Violence: Not on file    Review of Systems: See HPI, otherwise negative ROS  Physical Exam: BP 139/73   Pulse 71   Temp 97.7 F (36.5 C)   Ht '5\' 3"'$  (  1.6 m)   Wt 150 lb 6.4 oz (68.2 kg)   BMI 26.64  kg/m  General:   Alert,  pleasant and cooperative in NAD Skin:  Intact without significant lesions or rashes. Neck:  Supple; no masses or thyromegaly. No significant cervical adenopathy. Lungs:  Clear throughout to auscultation.   No wheezes, crackles, or rhonchi. No acute distress. Heart:  Regular rate and rhythm; no murmurs, clicks, rubs,  or gallops. Abdomen: Non-distended, normal bowel sounds.  Soft and nontender without appreciable mass or hepatosplenomegaly.  Pulses:  Normal pulses noted. Extremities:  Without clubbing or edema.  Impression/Plan: Pleasant 75 year old gentleman with a history of alcohol use disorder -  no alcohol in 7 years.  History of PAD, CVD, status post multiple stents, Cardiomyopathy with AICD/pacer. Based on clinical, biochemical and radiographic evidence, patient does not have advanced fibrosis/cirrhosis.   Recommendations:  Hepatic ultrasound with elastography to confirm low fibrosis score.  If it is confirmed low, patient needs no further liver evaluation.  He was congratulated on long-term sobriety today during his office visit.  Notice: This dictation was prepared with Dragon dictation along with smaller phrase technology. Any transcriptional errors that result from this process are unintentional and may not be corrected upon review.

## 2021-06-27 NOTE — Patient Instructions (Addendum)
It was good to meet you today!  As discussed, we will repeat ultrasound with elastography to confirm the good fibrosis score seen previously.  Congratulations to you for no longer drinking any alcohol!  Further recommendations to follow.

## 2021-06-28 ENCOUNTER — Other Ambulatory Visit: Payer: Self-pay | Admitting: *Deleted

## 2021-06-28 ENCOUNTER — Telehealth: Payer: Self-pay | Admitting: *Deleted

## 2021-06-28 DIAGNOSIS — K7030 Alcoholic cirrhosis of liver without ascites: Secondary | ICD-10-CM

## 2021-06-28 NOTE — Telephone Encounter (Signed)
Called pt. [place]ware [place] scheduled for 8/11 at 7:30am, arrival 7:15am, npo midnight. He also wanted me to call and leave it on his VM. Appt details left on VM.

## 2021-07-05 ENCOUNTER — Ambulatory Visit (INDEPENDENT_AMBULATORY_CARE_PROVIDER_SITE_OTHER): Payer: Medicare Other

## 2021-07-05 DIAGNOSIS — I255 Ischemic cardiomyopathy: Secondary | ICD-10-CM

## 2021-07-05 LAB — CUP PACEART REMOTE DEVICE CHECK
Battery Remaining Longevity: 23 mo
Battery Remaining Percentage: 28 %
Battery Voltage: 2.86 V
Brady Statistic AP VP Percent: 33 %
Brady Statistic AP VS Percent: 1 %
Brady Statistic AS VP Percent: 65 %
Brady Statistic AS VS Percent: 1 %
Brady Statistic RA Percent Paced: 33 %
Date Time Interrogation Session: 20220810055016
HighPow Impedance: 70 Ohm
HighPow Impedance: 70 Ohm
Implantable Lead Implant Date: 20160923
Implantable Lead Implant Date: 20160923
Implantable Lead Implant Date: 20160923
Implantable Lead Location: 753858
Implantable Lead Location: 753859
Implantable Lead Location: 753860
Implantable Lead Model: 7122
Implantable Pulse Generator Implant Date: 20160923
Lead Channel Impedance Value: 430 Ohm
Lead Channel Impedance Value: 480 Ohm
Lead Channel Impedance Value: 750 Ohm
Lead Channel Pacing Threshold Amplitude: 0.75 V
Lead Channel Pacing Threshold Amplitude: 0.75 V
Lead Channel Pacing Threshold Amplitude: 1.75 V
Lead Channel Pacing Threshold Pulse Width: 0.5 ms
Lead Channel Pacing Threshold Pulse Width: 0.5 ms
Lead Channel Pacing Threshold Pulse Width: 0.8 ms
Lead Channel Sensing Intrinsic Amplitude: 12 mV
Lead Channel Sensing Intrinsic Amplitude: 3.5 mV
Lead Channel Setting Pacing Amplitude: 2 V
Lead Channel Setting Pacing Amplitude: 2 V
Lead Channel Setting Pacing Amplitude: 2.5 V
Lead Channel Setting Pacing Pulse Width: 0.5 ms
Lead Channel Setting Pacing Pulse Width: 0.8 ms
Lead Channel Setting Sensing Sensitivity: 1 mV
Pulse Gen Serial Number: 7294918

## 2021-07-06 ENCOUNTER — Other Ambulatory Visit: Payer: Self-pay

## 2021-07-06 ENCOUNTER — Ambulatory Visit (HOSPITAL_COMMUNITY)
Admission: RE | Admit: 2021-07-06 | Discharge: 2021-07-06 | Disposition: A | Discharge status: Home / Self Care | Payer: Medicare Other | Source: Ambulatory Visit | Attending: Internal Medicine | Admitting: Internal Medicine

## 2021-07-06 DIAGNOSIS — K7030 Alcoholic cirrhosis of liver without ascites: Secondary | ICD-10-CM | POA: Insufficient documentation

## 2021-07-06 DIAGNOSIS — K7460 Unspecified cirrhosis of liver: Secondary | ICD-10-CM | POA: Diagnosis not present

## 2021-07-10 ENCOUNTER — Ambulatory Visit (INDEPENDENT_AMBULATORY_CARE_PROVIDER_SITE_OTHER): Payer: Medicare Other | Admitting: Surgery

## 2021-07-10 ENCOUNTER — Ambulatory Visit (HOSPITAL_COMMUNITY)
Admission: RE | Admit: 2021-07-10 | Discharge: 2021-07-10 | Disposition: A | Discharge status: Home / Self Care | Payer: Medicare Other | Source: Ambulatory Visit | Attending: Surgery | Admitting: Surgery

## 2021-07-10 ENCOUNTER — Ambulatory Visit (INDEPENDENT_AMBULATORY_CARE_PROVIDER_SITE_OTHER)
Admission: RE | Admit: 2021-07-10 | Discharge: 2021-07-10 | Disposition: A | Discharge status: Home / Self Care | Payer: Medicare Other | Source: Ambulatory Visit | Attending: Surgery | Admitting: Surgery

## 2021-07-10 ENCOUNTER — Other Ambulatory Visit: Payer: Self-pay

## 2021-07-10 ENCOUNTER — Encounter: Payer: Self-pay | Admitting: Surgery

## 2021-07-10 VITALS — BP 126/73 | HR 60 | Temp 97.4°F | Resp 20 | Ht 63.0 in | Wt 148.0 lb

## 2021-07-10 DIAGNOSIS — I255 Ischemic cardiomyopathy: Secondary | ICD-10-CM | POA: Diagnosis not present

## 2021-07-10 DIAGNOSIS — I70213 Atherosclerosis of native arteries of extremities with intermittent claudication, bilateral legs: Secondary | ICD-10-CM | POA: Insufficient documentation

## 2021-07-10 NOTE — Progress Notes (Signed)
[facility] [place]  Patient name: Roy [name] MRN: [id number] DOB: 08-29-1946 Sex: male   REASON FOR VISIT:    Follow up  HISOTRY OF PRESENT ILLNESS:    Roy [name] is a 75 y.o. male who is status post right iliofemoral endarterectomy with patch angioplasty as well as common iliac and superficial femoral artery stenting.  This was performed on 03/31/2020.  He was seen in the office on 04/06/2020 with redness from his incision.  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  This was treated with antibiotic beads and a wound VAC.  Cultures were negative.   He has undergone the following procedures:   2015:  Bilateral iliac stenting (claudication) 08-16-2017: Left femoral to above-knee popliteal bypass graft with ipsilateral saphenous vein 09/03/2017: I&D of a distal incision fluid collection 04/22/2018: Right superficial femoral artery atherectomy and drug-coated balloon angioplasty.  Angioplasty of right external iliac artery 03/08/2020: Right superficial femoral artery stent, drug-coated balloon angioplasty left common femoral artery 03/31/2020: Right iliofemoral endarterectomy with bovine patch angioplasty and right common iliac stent, right superficial femoral artery stent 04/07/2020: I&D of right groin seroma 08/23/2020: Drug-coated balloon angioplasty, left common femoral artery.  Stent right external iliac artery   He states that his legs feel great.  He is not having claudication symptoms.  He continues to smoke.  He is on dual antiplatelet therapy and a statin   PAST MEDICAL HISTORY:   Past Medical History:  Diagnosis Date   AICD (automatic cardioverter/defibrillator) present 08/19/2015   St Jude BiV ICD for primary prevention by Dr. Lovena Le   Alcohol abuse    6 beers per day; hospital admission in 2009 for withdrawal symptoms   Alcoholic cirrhosis ([place])    Anxiety and depression     denies    Cerebrovascular disease 2009   TIA; 2009- right ICA stent; re-intervention for restenosis complicated by [facility] w/o sx   CHF (congestive heart failure) ([place]) 06-02-14   Chronic obstructive pulmonary disease (HCC)    Degenerative joint disease    Total shoulder arthroplasty-right   Hyperlipidemia    Hypertension    LBBB (left bundle branch block)    Normal echo-2011; stress nuclear in 09/2010--septal hypoperfusion representing nontransmural infarction or the effect of left bundle branch block, no ischemia   Myocardial infarction [facility]) June 02, 2014   Massive Heart Attack   Peripheral vascular disease ([place])    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   Thrombocytopenia (HCC)    Tobacco abuse    -100 pack years; 1.5 packs per day   Traumatic seroma of thigh (HCC)    left   Tubular adenoma of colon      FAMILY HISTORY:   Family History  Problem Relation Age of Onset   Coronary artery disease Mother    Diabetes Mother    Heart disease Mother        Before age 17 - 51 Bypasses   Hypertension Mother    Heart attack Mother        3-4 Heart attacks   Alzheimer's disease Father    Diabetes Father    Heart attack Sister    Cancer Brother        Prostate   Hyperlipidemia Son    Prostate cancer Brother    Alzheimer's disease Sister    Colon cancer Neg Hx     SOCIAL HISTORY:   Social History   Tobacco Use  Smoking status: Every Day    Packs/day: 1.00    Years: 60.00    Pack years: 60.00    Types: Cigarettes    Start date: 08/20/1957   Smokeless tobacco: Never  Substance Use Topics   Alcohol use: No    Alcohol/week: 0.0 standard drinks    Comment: quit in July 2015     ALLERGIES:   No Known Allergies   CURRENT MEDICATIONS:   Current Outpatient Medications  Medication Sig Dispense Refill   acetaminophen (TYLENOL) 500 MG tablet Take 1,000 mg by mouth as needed for moderate pain or headache.      aspirin EC 81 MG tablet Take 81 mg by mouth every  evening.      atorvastatin (LIPITOR) 20 MG tablet Take 20 mg by mouth daily.      BRILINTA 90 MG TABS tablet Take 90 mg by mouth 2 (two) times daily.     carvedilol (COREG) 3.125 MG tablet TAKE 1 TABLET TWICE A DAY 180 tablet 0   dextromethorphan-guaiFENesin (MUCINEX DM) 30-600 MG 12hr tablet Take 1 tablet by mouth daily.      ENTRESTO 49-51 MG TAKE 1 TABLET TWICE A DAY 180 tablet 4   gabapentin (NEURONTIN) 300 MG capsule Take 300 mg by mouth 2 (two) times daily.      magnesium oxide (MAG-OX) 400 MG tablet Take 400 mg by mouth daily.     Multiple Vitamins-Minerals (CENTRUM SILVER PO) Take 1 tablet by mouth daily. 50+     Omega-3 Fatty Acids (FISH OIL) 1000 MG CAPS Take 2,000 mg by mouth daily.     oxyCODONE-acetaminophen (PERCOCET/ROXICET) 5-325 MG tablet Take 1 tablet by mouth daily as needed for moderate pain.      SUPER B COMPLEX/C PO Take 1 tablet by mouth at bedtime.      tamsulosin (FLOMAX) 0.4 MG CAPS capsule Take 0.4 mg by mouth 2 (two) times daily.      traMADol (ULTRAM) 50 MG tablet Take 50 mg by mouth daily as needed for moderate pain.      No current facility-administered medications for this visit.    REVIEW OF SYSTEMS:   '[X]'$  denotes positive finding, '[ ]'$  denotes negative finding Cardiac  Comments:  Chest pain or chest pressure:    Shortness of breath upon exertion:    Short of breath when lying flat:    Irregular heart rhythm:        Vascular    Pain in calf, thigh, or hip brought on by ambulation:    Pain in feet at night that wakes you up from your sleep:     Blood clot in your veins:    Leg swelling:         Pulmonary    Oxygen at home:    Productive cough:     Wheezing:         Neurologic    Sudden weakness in arms or legs:     Sudden numbness in arms or legs:     Sudden onset of difficulty speaking or slurred speech:    Temporary loss of vision in one eye:     Problems with dizziness:         Gastrointestinal    Blood in stool:     Vomited blood:          Genitourinary    Burning when urinating:     Blood in urine:        Psychiatric    Major depression:  Hematologic    Bleeding problems:    Problems with blood clotting too easily:        Skin    Rashes or ulcers:        Constitutional    Fever or chills:      PHYSICAL EXAM:   Vitals:   07/10/21 1011  BP: 126/73  Pulse: 60  Resp: 20  Temp: (!) 97.4 F (36.3 C)  SpO2: 95%  Weight: 148 lb (67.1 kg)  Height: '5\' 3"'$  (1.6 m)    GENERAL: The patient is a well-nourished male, in no acute distress. The vital signs are documented above. CARDIAC: There is a regular rate and rhythm.  VASCULAR: Palpable dorsalis pedis pulse bilaterally PULMONARY: Non-labored respirations MUSCULOSKELETAL: There are no major deformities or cyanosis. NEUROLOGIC: No focal weakness or paresthesias are detected. SKIN: There are no ulcers or rashes noted. PSYCHIATRIC: The patient has a normal affect.  STUDIES:   I have reviewed the following studies: +-------+-----------+-----------+------------+------------+  ABI/TBIToday's ABIToday's TBIPrevious ABIPrevious TBI  +-------+-----------+-----------+------------+------------+  Right  1.00       0.85       0.90        absent        +-------+-----------+-----------+------------+------------+  Left   0.84       0.89       0.88        0.54          +-------+-----------+-----------+------------+------------+  Right: Patent right SFA stent with no increased velocity within. Increased  velocity in the Right distal EIA in the 50 - 99% stenosis range.   Left: 50 - 74 % stenosis noted in the common femoral artery. Patent left  femoral to above knee popliteal bypass graft.     Patent right common iliac and external iliac artery stent with no  visualized stenosis..  The left external iliac artery stent appears patent with increased  velocity in what appears to be the native distal EIA in the 50 - 99%  stenosis range. [name]    MEDICAL ISSUES:   Carotid: There have been discrepancies in CT scans and angiograms with ultrasound for his carotid arteries.  Ultrasound does not show any significant stenosis however catheter-based angiography and CT scan suggest greater than 70%.  I will repeat his ultrasound in 6 months.  I will need to order this when he returns  in 3 months.  Lower extremity atherosclerotic vascular disease: The patient states his legs feel great and are not bothering him.  He has palpable dorsalis pedis pulses.  Ultrasound suggested elevated velocities.  I discussed these results with the patient.  Since he continues to have palpable pulses and no symptoms, we will continue to observe this.  I will bring him back in 3 months to see if there is been any change in his velocity profile.  If there has been, I would consider repeating his arteriogram.    Leia Alf, MD, FACS [facility] [phone] Pager [phone]

## 2021-07-12 ENCOUNTER — Other Ambulatory Visit: Payer: Self-pay

## 2021-07-12 DIAGNOSIS — I70213 Atherosclerosis of native arteries of extremities with intermittent claudication, bilateral legs: Secondary | ICD-10-CM

## 2021-07-27 NOTE — Progress Notes (Signed)
Remote ICD transmission.   

## 2021-08-18 DIAGNOSIS — Z23 Encounter for immunization: Secondary | ICD-10-CM | POA: Diagnosis not present

## 2021-10-04 ENCOUNTER — Ambulatory Visit (INDEPENDENT_AMBULATORY_CARE_PROVIDER_SITE_OTHER): Payer: Medicare Other

## 2021-10-04 DIAGNOSIS — I255 Ischemic cardiomyopathy: Secondary | ICD-10-CM | POA: Diagnosis not present

## 2021-10-04 LAB — CUP PACEART REMOTE DEVICE CHECK
Battery Remaining Longevity: 23 mo
Battery Remaining Percentage: 28 %
Battery Voltage: 2.86 V
Brady Statistic AP VP Percent: 33 %
Brady Statistic AP VS Percent: 1 %
Brady Statistic AS VP Percent: 65 %
Brady Statistic AS VS Percent: 1 %
Brady Statistic RA Percent Paced: 33 %
Date Time Interrogation Session: 20221109092540
HighPow Impedance: 75 Ohm
HighPow Impedance: 75 Ohm
Implantable Lead Implant Date: 20160923
Implantable Lead Implant Date: 20160923
Implantable Lead Implant Date: 20160923
Implantable Lead Location: 753858
Implantable Lead Location: 753859
Implantable Lead Location: 753860
Implantable Lead Model: 7122
Implantable Pulse Generator Implant Date: 20160923
Lead Channel Impedance Value: 430 Ohm
Lead Channel Impedance Value: 480 Ohm
Lead Channel Impedance Value: 760 Ohm
Lead Channel Pacing Threshold Amplitude: 0.75 V
Lead Channel Pacing Threshold Amplitude: 0.875 V
Lead Channel Pacing Threshold Amplitude: 1.75 V
Lead Channel Pacing Threshold Pulse Width: 0.5 ms
Lead Channel Pacing Threshold Pulse Width: 0.5 ms
Lead Channel Pacing Threshold Pulse Width: 0.8 ms
Lead Channel Sensing Intrinsic Amplitude: 12 mV
Lead Channel Sensing Intrinsic Amplitude: 3.4 mV
Lead Channel Setting Pacing Amplitude: 2 V
Lead Channel Setting Pacing Amplitude: 2 V
Lead Channel Setting Pacing Amplitude: 2.5 V
Lead Channel Setting Pacing Pulse Width: 0.5 ms
Lead Channel Setting Pacing Pulse Width: 0.8 ms
Lead Channel Setting Sensing Sensitivity: 1 mV
Pulse Gen Serial Number: 7294918

## 2021-10-09 ENCOUNTER — Other Ambulatory Visit: Payer: Self-pay

## 2021-10-09 ENCOUNTER — Ambulatory Visit (HOSPITAL_COMMUNITY)
Admission: RE | Admit: 2021-10-09 | Discharge: 2021-10-09 | Disposition: A | Discharge status: Home / Self Care | Payer: Medicare Other | Source: Ambulatory Visit | Attending: Surgery | Admitting: Surgery

## 2021-10-09 ENCOUNTER — Ambulatory Visit (INDEPENDENT_AMBULATORY_CARE_PROVIDER_SITE_OTHER): Payer: Medicare Other | Admitting: Surgery

## 2021-10-09 ENCOUNTER — Ambulatory Visit (INDEPENDENT_AMBULATORY_CARE_PROVIDER_SITE_OTHER)
Admission: RE | Admit: 2021-10-09 | Discharge: 2021-10-09 | Disposition: A | Discharge status: Home / Self Care | Payer: Medicare Other | Source: Ambulatory Visit | Attending: Surgery | Admitting: Surgery

## 2021-10-09 ENCOUNTER — Encounter: Payer: Self-pay | Admitting: Surgery

## 2021-10-09 VITALS — BP 146/78 | HR 61 | Temp 97.9°F | Resp 20 | Ht 63.0 in | Wt 146.0 lb

## 2021-10-09 DIAGNOSIS — I70213 Atherosclerosis of native arteries of extremities with intermittent claudication, bilateral legs: Secondary | ICD-10-CM | POA: Diagnosis not present

## 2021-10-09 DIAGNOSIS — I255 Ischemic cardiomyopathy: Secondary | ICD-10-CM

## 2021-10-09 NOTE — Progress Notes (Signed)
[facility] [place]  Patient name: Roy [name] MRN: [id number] DOB: [birth year] Sex: male   REASON FOR VISIT:    Follow up  HISOTRY OF PRESENT ILLNESS:    Roy [name] is a 75 y.o. male who is status post right iliofemoral endarterectomy with patch angioplasty as well as common iliac and superficial femoral artery stenting.  This was performed on 03/31/2020.  He was seen in the office on 04/06/2020 with redness from his incision.  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  This was treated with antibiotic beads and a wound VAC.  Cultures were negative.   He has undergone the following procedures:   2015:  Bilateral iliac stenting (claudication) 08-16-2017: Left femoral to above-knee popliteal bypass graft with ipsilateral saphenous vein 09/03/2017: I&D of a distal incision fluid collection 04/22/2018: Right superficial femoral artery atherectomy and drug-coated balloon angioplasty.  Angioplasty of right external iliac artery 03/08/2020: Right superficial femoral artery stent, drug-coated balloon angioplasty left common femoral artery 03/31/2020: Right iliofemoral endarterectomy with bovine patch angioplasty and right common iliac stent, right superficial femoral artery stent 04/07/2020: I&D of right groin seroma 08/23/2020: Drug-coated balloon angioplasty, left common femoral artery.  Stent right external iliac artery   At his last visit, his legs felt great and he had palpable pedal pulses.  However, there were elevated velocities on his duplex so he is back for  repeat ultrasound.  In addition, I am following his carotid arteries.  He is asymptomatic.  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   PAST MEDICAL HISTORY:   Past Medical History:  Diagnosis Date   AICD (automatic cardioverter/defibrillator) present 08/19/2015   St Jude BiV ICD for primary  prevention by Dr. Lovena Le   Alcohol abuse    6 beers per day; hospital admission in 2009 for withdrawal symptoms   Alcoholic cirrhosis ([place])    Anxiety and depression    denies    Cerebrovascular disease 2009   TIA; 2009- right ICA stent; re-intervention for restenosis complicated by [facility] w/o sx   CHF (congestive heart failure) ([place]) 06-02-14   Chronic obstructive pulmonary disease (HCC)    Degenerative joint disease    Total shoulder arthroplasty-right   Hyperlipidemia    Hypertension    LBBB (left bundle branch block)    Normal echo-2011; stress nuclear in 09/2010--septal hypoperfusion representing nontransmural infarction or the effect of left bundle branch block, no ischemia   Myocardial infarction [facility]) June 02, 2014   Massive Heart Attack   Peripheral vascular disease ([place])    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   Thrombocytopenia (HCC)    Tobacco abuse    -100 pack years; 1.5 packs per day   Traumatic seroma of thigh (HCC)    left   Tubular adenoma of colon      FAMILY HISTORY:   Family History  Problem Relation Age of Onset   Coronary artery disease Mother    Diabetes Mother    Heart disease Mother        Before age 44 - 72 Bypasses   Hypertension Mother    Heart attack Mother        3-4 Heart attacks   Alzheimer's disease Father    Diabetes Father    Heart attack Sister    Cancer Brother        Prostate   Hyperlipidemia Son    Prostate cancer  Brother    Alzheimer's disease Sister    Colon cancer Neg Hx     SOCIAL HISTORY:   Social History   Tobacco Use   Smoking status: Every Day    Packs/day: 1.00    Years: 60.00    Pack years: 60.00    Types: Cigarettes    Start date: 08/20/1957   Smokeless tobacco: Never  Substance Use Topics   Alcohol use: No    Alcohol/week: 0.0 standard drinks    Comment: quit in July 2015     ALLERGIES:   No Known Allergies   CURRENT MEDICATIONS:   Current Outpatient Medications  Medication Sig  Dispense Refill   acetaminophen (TYLENOL) 500 MG tablet Take 1,000 mg by mouth as needed for moderate pain or headache.      aspirin EC 81 MG tablet Take 81 mg by mouth every evening.      atorvastatin (LIPITOR) 20 MG tablet Take 20 mg by mouth daily.      BRILINTA 90 MG TABS tablet Take 90 mg by mouth 2 (two) times daily.     carvedilol (COREG) 3.125 MG tablet TAKE 1 TABLET TWICE A DAY 180 tablet 0   dextromethorphan-guaiFENesin (MUCINEX DM) 30-600 MG 12hr tablet Take 1 tablet by mouth daily.      ENTRESTO 49-51 MG TAKE 1 TABLET TWICE A DAY 180 tablet 4   gabapentin (NEURONTIN) 300 MG capsule Take 300 mg by mouth 2 (two) times daily.      magnesium oxide (MAG-OX) 400 MG tablet Take 400 mg by mouth daily.     Multiple Vitamins-Minerals (CENTRUM SILVER PO) Take 1 tablet by mouth daily. 50+     Omega-3 Fatty Acids (FISH OIL) 1000 MG CAPS Take 2,000 mg by mouth daily.     oxyCODONE-acetaminophen (PERCOCET/ROXICET) 5-325 MG tablet Take 1 tablet by mouth daily as needed for moderate pain.      SUPER B COMPLEX/C PO Take 1 tablet by mouth at bedtime.      tamsulosin (FLOMAX) 0.4 MG CAPS capsule Take 0.4 mg by mouth 2 (two) times daily.      traMADol (ULTRAM) 50 MG tablet Take 50 mg by mouth daily as needed for moderate pain.      No current facility-administered medications for this visit.    REVIEW OF SYSTEMS:   [X]  denotes positive finding, [ ]  denotes negative finding Cardiac  Comments:  Chest pain or chest pressure:    Shortness of breath upon exertion:    Short of breath when lying flat:    Irregular heart rhythm:        Vascular    Pain in calf, thigh, or hip brought on by ambulation:    Pain in feet at night that wakes you up from your sleep:     Blood clot in your veins:    Leg swelling:         Pulmonary    Oxygen at home:    Productive cough:     Wheezing:         Neurologic    Sudden weakness in arms or legs:     Sudden numbness in arms or legs:     Sudden onset of  difficulty speaking or slurred speech:    Temporary loss of vision in one eye:     Problems with dizziness:         Gastrointestinal    Blood in stool:     Vomited blood:  Genitourinary    Burning when urinating:     Blood in urine:        Psychiatric    Major depression:         Hematologic    Bleeding problems:    Problems with blood clotting too easily:        Skin    Rashes or ulcers:        Constitutional    Fever or chills:      PHYSICAL EXAM:   Vitals:   10/09/21 0950  BP: (!) 146/78  Pulse: 61  Resp: 20  Temp: 97.9 F (36.6 C)  SpO2: 97%  Weight: 146 lb (66.2 kg)  Height: 5\' 3"  (1.6 m)    GENERAL: The patient is a well-nourished male, in no acute distress. The vital signs are documented above. CARDIAC: There is a regular rate and rhythm.  VASCULAR: Palpable pedal pulses PULMONARY: Non-labored respirations MUSCULOSKELETAL: There are no major deformities or cyanosis. NEUROLOGIC: No focal weakness or paresthesias are detected. SKIN: There are no ulcers or rashes noted. PSYCHIATRIC: The patient has a normal affect.  STUDIES:   I have reviewed the following studies: Aortoiliac: Stenosis:  No evidence of stenosis in the right CIA or EIA stents.  >50% stenosis in the distal left EIA.  (Velocity equals 579 >50% stenosis in the left proximal IIA   ABI/TBIToday's ABIToday's TBIPrevious ABIPrevious TBI  +-------+-----------+-----------+------------+------------+  Right  0.86       0.73       1           0.85          +-------+-----------+-----------+------------+------------+  Left   0.74       0.52       0.84        0.89          +-------+-----------+-----------+------------+------------+   Lower extremity: Right: Patent stent with 50-74% inflow stenosis.  (Velocity 235 cm/s)  Left: Patent graft with no stenosis identified.   MEDICAL ISSUES:   PAD: The patient remains asymptomatic however there has been progression of the  stenosis on the left external iliac artery and right sided stent.  I think that since his velocities have increased do not need to proceed with angiography.  This would be through a left femoral approach with plans for intervention of the left external iliac artery and possible of the right common femoral and superficial femoral artery.  This will be scheduled in the near future.    Leia Alf, MD, FACS [facility] [phone] Pager [phone]

## 2021-10-09 NOTE — H&P (View-Only) (Signed)
[facility] [place]  Patient name: Roy [name] MRN: [id number] DOB: 19-Apr-1946 Sex: male   REASON FOR VISIT:    Follow up  HISOTRY OF PRESENT ILLNESS:    Roy [name] is a 75 y.o. male who is status post right iliofemoral endarterectomy with patch angioplasty as well as common iliac and superficial femoral artery stenting.  This was performed on 03/31/2020.  He was seen in the office on 04/06/2020 with redness from his incision.  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  This was treated with antibiotic beads and a wound VAC.  Cultures were negative.   He has undergone the following procedures:   2015:  Bilateral iliac stenting (claudication) 08-16-2017: Left femoral to above-knee popliteal bypass graft with ipsilateral saphenous vein 09/03/2017: I&D of a distal incision fluid collection 04/22/2018: Right superficial femoral artery atherectomy and drug-coated balloon angioplasty.  Angioplasty of right external iliac artery 03/08/2020: Right superficial femoral artery stent, drug-coated balloon angioplasty left common femoral artery 03/31/2020: Right iliofemoral endarterectomy with bovine patch angioplasty and right common iliac stent, right superficial femoral artery stent 04/07/2020: I&D of right groin seroma 08/23/2020: Drug-coated balloon angioplasty, left common femoral artery.  Stent right external iliac artery   At his last visit, his legs felt great and he had palpable pedal pulses.  However, there were elevated velocities on his duplex so he is back for  repeat ultrasound.  In addition, I am following his carotid arteries.  He is asymptomatic.  Catheter based angio showed 40% left ICA stenosis with a small 5.74mm aneurysm.   PAST MEDICAL HISTORY:   Past Medical History:  Diagnosis Date   AICD (automatic cardioverter/defibrillator) present 08/19/2015   St Jude BiV ICD for primary  prevention by Dr. Lovena Le   Alcohol abuse    6 beers per day; hospital admission in 2009 for withdrawal symptoms   Alcoholic cirrhosis ([place])    Anxiety and depression    denies    Cerebrovascular disease 2009   TIA; 2009- right ICA stent; re-intervention for restenosis complicated by [facility] w/o sx   CHF (congestive heart failure) ([place]) 06-02-14   Chronic obstructive pulmonary disease (HCC)    Degenerative joint disease    Total shoulder arthroplasty-right   Hyperlipidemia    Hypertension    LBBB (left bundle branch block)    Normal echo-2011; stress nuclear in 09/2010--septal hypoperfusion representing nontransmural infarction or the effect of left bundle branch block, no ischemia   Myocardial infarction [facility]) June 02, 2014   Massive Heart Attack   Peripheral vascular disease ([place])    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   Thrombocytopenia (HCC)    Tobacco abuse    -100 pack years; 1.5 packs per day   Traumatic seroma of thigh (HCC)    left   Tubular adenoma of colon      FAMILY HISTORY:   Family History  Problem Relation Age of Onset   Coronary artery disease Mother    Diabetes Mother    Heart disease Mother        Before age 39 - 74 Bypasses   Hypertension Mother    Heart attack Mother        3-4 Heart attacks   Alzheimer's disease Father    Diabetes Father    Heart attack Sister    Cancer Brother        Prostate   Hyperlipidemia Son    Prostate cancer  Brother    Alzheimer's disease Sister    Colon cancer Neg Hx     SOCIAL HISTORY:   Social History   Tobacco Use   Smoking status: Every Day    Packs/day: 1.00    Years: 60.00    Pack years: 60.00    Types: Cigarettes    Start date: 08/20/1957   Smokeless tobacco: Never  Substance Use Topics   Alcohol use: No    Alcohol/week: 0.0 standard drinks    Comment: quit in July 2015     ALLERGIES:   No Known Allergies   CURRENT MEDICATIONS:   Current Outpatient Medications  Medication Sig  Dispense Refill   acetaminophen (TYLENOL) 500 MG tablet Take 1,000 mg by mouth as needed for moderate pain or headache.      aspirin EC 81 MG tablet Take 81 mg by mouth every evening.      atorvastatin (LIPITOR) 20 MG tablet Take 20 mg by mouth daily.      BRILINTA 90 MG TABS tablet Take 90 mg by mouth 2 (two) times daily.     carvedilol (COREG) 3.125 MG tablet TAKE 1 TABLET TWICE A DAY 180 tablet 0   dextromethorphan-guaiFENesin (MUCINEX DM) 30-600 MG 12hr tablet Take 1 tablet by mouth daily.      ENTRESTO 49-51 MG TAKE 1 TABLET TWICE A DAY 180 tablet 4   gabapentin (NEURONTIN) 300 MG capsule Take 300 mg by mouth 2 (two) times daily.      magnesium oxide (MAG-OX) 400 MG tablet Take 400 mg by mouth daily.     Multiple Vitamins-Minerals (CENTRUM SILVER PO) Take 1 tablet by mouth daily. 50+     Omega-3 Fatty Acids (FISH OIL) 1000 MG CAPS Take 2,000 mg by mouth daily.     oxyCODONE-acetaminophen (PERCOCET/ROXICET) 5-325 MG tablet Take 1 tablet by mouth daily as needed for moderate pain.      SUPER B COMPLEX/C PO Take 1 tablet by mouth at bedtime.      tamsulosin (FLOMAX) 0.4 MG CAPS capsule Take 0.4 mg by mouth 2 (two) times daily.      traMADol (ULTRAM) 50 MG tablet Take 50 mg by mouth daily as needed for moderate pain.      No current facility-administered medications for this visit.    REVIEW OF SYSTEMS:   [X]  denotes positive finding, [ ]  denotes negative finding Cardiac  Comments:  Chest pain or chest pressure:    Shortness of breath upon exertion:    Short of breath when lying flat:    Irregular heart rhythm:        Vascular    Pain in calf, thigh, or hip brought on by ambulation:    Pain in feet at night that wakes you up from your sleep:     Blood clot in your veins:    Leg swelling:         Pulmonary    Oxygen at home:    Productive cough:     Wheezing:         Neurologic    Sudden weakness in arms or legs:     Sudden numbness in arms or legs:     Sudden onset of  difficulty speaking or slurred speech:    Temporary loss of vision in one eye:     Problems with dizziness:         Gastrointestinal    Blood in stool:     Vomited blood:  Genitourinary    Burning when urinating:     Blood in urine:        Psychiatric    Major depression:         Hematologic    Bleeding problems:    Problems with blood clotting too easily:        Skin    Rashes or ulcers:        Constitutional    Fever or chills:      PHYSICAL EXAM:   Vitals:   10/09/21 0950  BP: (!) 146/78  Pulse: 61  Resp: 20  Temp: 97.9 F (36.6 C)  SpO2: 97%  Weight: 146 lb (66.2 kg)  Height: 5\' 3"  (1.6 m)    GENERAL: The patient is a well-nourished male, in no acute distress. The vital signs are documented above. CARDIAC: There is a regular rate and rhythm.  VASCULAR: Palpable pedal pulses PULMONARY: Non-labored respirations MUSCULOSKELETAL: There are no major deformities or cyanosis. NEUROLOGIC: No focal weakness or paresthesias are detected. SKIN: There are no ulcers or rashes noted. PSYCHIATRIC: The patient has a normal affect.  STUDIES:   I have reviewed the following studies: Aortoiliac: Stenosis:  No evidence of stenosis in the right CIA or EIA stents.  >50% stenosis in the distal left EIA.  (Velocity equals 579 >50% stenosis in the left proximal IIA   ABI/TBIToday's ABIToday's TBIPrevious ABIPrevious TBI  +-------+-----------+-----------+------------+------------+  Right  0.86       0.73       1           0.85          +-------+-----------+-----------+------------+------------+  Left   0.74       0.52       0.84        0.89          +-------+-----------+-----------+------------+------------+   Lower extremity: Right: Patent stent with 50-74% inflow stenosis.  (Velocity 235 cm/s)  Left: Patent graft with no stenosis identified.   MEDICAL ISSUES:   PAD: The patient remains asymptomatic however there has been progression of the  stenosis on the left external iliac artery and right sided stent.  I think that since his velocities have increased do not need to proceed with angiography.  This would be through a left femoral approach with plans for intervention of the left external iliac artery and possible of the right common femoral and superficial femoral artery.  This will be scheduled in the near future.    Leia Alf, MD, FACS [facility] [phone] Pager [phone]

## 2021-10-12 ENCOUNTER — Other Ambulatory Visit: Payer: Self-pay

## 2021-10-12 DIAGNOSIS — I6522 Occlusion and stenosis of left carotid artery: Secondary | ICD-10-CM

## 2021-10-12 NOTE — Progress Notes (Signed)
Remote ICD transmission.   

## 2021-10-24 ENCOUNTER — Ambulatory Visit (INDEPENDENT_AMBULATORY_CARE_PROVIDER_SITE_OTHER): Payer: Medicare Other | Admitting: Cardiology

## 2021-10-24 ENCOUNTER — Encounter: Payer: Self-pay | Admitting: Cardiology

## 2021-10-24 ENCOUNTER — Telehealth: Payer: Self-pay | Admitting: Cardiology

## 2021-10-24 VITALS — BP 162/70 | HR 60 | Ht 63.0 in | Wt 152.0 lb

## 2021-10-24 DIAGNOSIS — E782 Mixed hyperlipidemia: Secondary | ICD-10-CM | POA: Diagnosis not present

## 2021-10-24 DIAGNOSIS — I10 Essential (primary) hypertension: Secondary | ICD-10-CM | POA: Diagnosis not present

## 2021-10-24 DIAGNOSIS — I2510 Atherosclerotic heart disease of native coronary artery without angina pectoris: Secondary | ICD-10-CM | POA: Diagnosis not present

## 2021-10-24 DIAGNOSIS — I255 Ischemic cardiomyopathy: Secondary | ICD-10-CM

## 2021-10-24 DIAGNOSIS — I5022 Chronic systolic (congestive) heart failure: Secondary | ICD-10-CM | POA: Diagnosis not present

## 2021-10-24 MED ORDER — SACUBITRIL-VALSARTAN 97-103 MG PO TABS: 1.0000 | ORAL_TABLET | Freq: Two times a day (BID) | ORAL | 6 refills | Status: DC

## 2021-10-24 MED ORDER — SACUBITRIL-VALSARTAN 97-103 MG PO TABS: 1.0000 | ORAL_TABLET | Freq: Two times a day (BID) | ORAL | 3 refills | Status: DC

## 2021-10-24 NOTE — Progress Notes (Signed)
Clinical Summary Roy [name] is a 75 y.o.male former patient of Dr Bronson Ing, this is our first visit together. Seen for the following medical problems.  1.CAD -history of NSTEMI 05/2014 in setting of pneumonia and respiratory failure. LVEF 20% at that time, did not pursue cath due to renal dysfunction and AMS. Subsequent stress imaging has supported scar - no recent chest pains. No SOB/DOE - compliant with meds. Has been on ASA/brillinta per Dr Estanislado Pandy, history of ICA stenting.     2. PAD - extensive history of revascularization - has been on DAPT - upcoming angiography planned by vascular  3.Chronic systolic HF s/p BiV AICD - LVEF has normalized - 07/2017 echo LVEF 50-55%, no WMAs.   4. Hyperlipidemia - labs followed by pcp   5. HTN - compliant with meds  6. Cerebral vascular disease - followed by Dr Estanislado Pandy, history of ICA stent    Past Medical History:  Diagnosis Date   AICD (automatic cardioverter/defibrillator) present 08/19/2015   [employer] Jude BiV ICD for primary prevention by Dr. Lovena Le   Alcohol abuse    6 beers per day; hospital admission in 2009 for withdrawal symptoms   Alcoholic cirrhosis ([place])    Anxiety and depression    denies    Cerebrovascular disease 2009   TIA; 2009- right ICA stent; re-intervention for restenosis complicated by [facility] w/o sx   CHF (congestive heart failure) ([place]) 06-02-14   Chronic obstructive pulmonary disease ([place])    Degenerative joint disease    Total shoulder arthroplasty-right   Hyperlipidemia    Hypertension    LBBB (left bundle Knute Mazzuca block)    Normal echo-2011; stress nuclear in 09/2010--septal hypoperfusion representing nontransmural infarction or the effect of left bundle Novie Maggio block, no ischemia   Myocardial infarction [facility]) June 02, 2014   Massive Heart Attack   Peripheral vascular disease ([place])    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   Thrombocytopenia (HCC)    Tobacco abuse    -100 pack years; 1.5  packs per day   Traumatic seroma of thigh (HCC)    left   Tubular adenoma of colon      No Known Allergies   Current Outpatient Medications  Medication Sig Dispense Refill   acetaminophen (TYLENOL) 500 MG tablet Take 1,000 mg by mouth as needed for moderate pain or headache.      aspirin EC 81 MG tablet Take 81 mg by mouth every evening.      atorvastatin (LIPITOR) 20 MG tablet Take 20 mg by mouth daily.      BRILINTA 90 MG TABS tablet Take 90 mg by mouth 2 (two) times daily.     carvedilol (COREG) 3.125 MG tablet TAKE 1 TABLET TWICE A DAY 180 tablet 0   dextromethorphan-guaiFENesin (MUCINEX DM) 30-600 MG 12hr tablet Take 1 tablet by mouth daily.      ENTRESTO 49-51 MG TAKE 1 TABLET TWICE A DAY 180 tablet 4   gabapentin (NEURONTIN) 300 MG capsule Take 300 mg by mouth 2 (two) times daily.      magnesium oxide (MAG-OX) 400 MG tablet Take 400 mg by mouth daily.     Multiple Vitamins-Minerals (CENTRUM SILVER PO) Take 1 tablet by mouth daily. 50+     Omega-3 Fatty Acids (FISH OIL) 1000 MG CAPS Take 2,000 mg by mouth daily.     oxyCODONE-acetaminophen (PERCOCET/ROXICET) 5-325 MG tablet Take 1 tablet by mouth daily as needed for moderate pain.      SUPER B  COMPLEX/C PO Take 1 tablet by mouth at bedtime.      tamsulosin (FLOMAX) 0.4 MG CAPS capsule Take 0.4 mg by mouth 2 (two) times daily.      traMADol (ULTRAM) 50 MG tablet Take 50 mg by mouth daily as needed for moderate pain.      No current facility-administered medications for this visit.     Past Surgical History:  Procedure Laterality Date   ABDOMINAL AORTAGRAM [name] 05/04/2014   Procedure: ABDOMINAL AORTAGRAM;  Surgeon: [name], MD;  Location: [facility] CATH LAB;  Service: Cardiovascular;  Laterality: [name];   ABDOMINAL AORTOGRAM [name] 06/25/2017   Procedure: Abdominal Aortogram;  Surgeon: [name], MD;  Location: [place] CV LAB;  Service: Cardiovascular;  Laterality: [name];   ABDOMINAL AORTOGRAM W/LOWER EXTREMITY [name]  04/22/2018   Procedure: ABDOMINAL AORTOGRAM W/LOWER EXTREMITY;  Surgeon: [name], MD;  Location: [place] CV LAB;  Service: Cardiovascular;  Laterality: [name];   ABDOMINAL AORTOGRAM W/LOWER EXTREMITY [name] 03/08/2020   Procedure: ABDOMINAL AORTOGRAM W/LOWER EXTREMITY;  Surgeon: [name], MD;  Location: [place] CV LAB;  Service: Cardiovascular;  Laterality: [name];   ABDOMINAL AORTOGRAM W/LOWER EXTREMITY Bilateral 08/23/2020   Procedure: ABDOMINAL AORTOGRAM W/LOWER EXTREMITY;  Surgeon: [name], MD;  Location: [place] CV LAB;  Service: Cardiovascular;  Laterality: Bilateral;   AGILE CAPSULE [name] 03/18/2014   Procedure: AGILE CAPSULE;  Surgeon: Danie Binder, MD;  [facility];  Service: Endoscopy;  Laterality: [name];  8:24   APPLICATION OF WOUND VAC Left 09/03/2017   Procedure: APPLICATION OF WOUND VAC;  Surgeon: Conrad [place], MD;  Location: [place];  Service: Vascular;  Laterality: Left;   APPLICATION OF WOUND VAC Right 04/07/2020   Procedure: PLACEMENT OF ANTIBIOTIC BEADS AND APPLICATION OF WOUND VAC;  Surgeon: [name], MD;  [facility];  Service: Vascular;  Laterality: Right;   BACK SURGERY     BACTERIAL OVERGROWTH TEST [name] 05/24/2015   Procedure: BACTERIAL OVERGROWTH TEST;  Surgeon: Danie Binder, MD;  [facility];  Service: Endoscopy;  Laterality: [name];  0700   BI-VENTRICULAR IMPLANTABLE CARDIOVERTER DEFIBRILLATOR  (CRT-D)  08/19/2015   CARPAL TUNNEL RELEASE Left 02/02/2016   Procedure: LEFT CARPAL TUNNEL RELEASE;  Surgeon: Daryll Brod, MD;  Location: [place];  Service: Orthopedics;  Laterality: Left;  ANESTHESIA: IV REGIONAL UPPER ARM   CATARACT EXTRACTION W/PHACO Right 03/08/2015   Procedure: CATARACT EXTRACTION PHACO AND INTRAOCULAR LENS PLACEMENT (IOC);  Surgeon: Rutherford Guys, MD;  Location: [facility];  Service: Ophthalmology;  Laterality: Right;  CDE:9.46   CATARACT EXTRACTION W/PHACO Left 03/22/2015   Procedure: CATARACT  EXTRACTION PHACO AND INTRAOCULAR LENS PLACEMENT (IOC);  Surgeon: Rutherford Guys, MD;  [facility]: [facility];  Service: Ophthalmology;  Laterality: Left;  CDE:5.80   COLONOSCOPY  08/22/09   Fields-(Tubular Adenoma)3-mm transverse polyp/4-mm polyp otherwise noraml/small internal hemorrhoids   COLONOSCOPY [name] 04/14/2014   MPN:TIRWE internal hemorrhids/normal mocsa in the terminal iluem/left colonis redundant   COLONOSCOPY W/ POLYPECTOMY  2011   ENDARTERECTOMY FEMORAL Left 08/16/2017   Procedure: ENDARTERECTOMY LEFT PROFUNDA FEMORAL;  Surgeon: [name], MD;  Location[facility] OR;  Service: Vascular;  Laterality: Left;   ENDARTERECTOMY FEMORAL Right 03/31/2020   Procedure: RIGHT FEMORAL ENDARTERECTOMY;  Surgeon: [name], MD;  Location: [place];  Service: Vascular;  Laterality: Right;   EP IMPLANTABLE DEVICE [name] 08/19/2015   Procedure: BiV ICD Insertion CRT-D;  Surgeon: Evans Lance, MD;  Location: [place] CV LAB;  Service: Cardiovascular;  Laterality: [name];   ESOPHAGOGASTRODUODENOSCOPY [name] 03/05/2014   SLF: 1. Stricture at the gastroesophagael junction 2. Small hiatal hernia 3. Moderate non-erosive gastritis and duodentitis. 4. No surce for Melena identified.    FEMORAL-POPLITEAL BYPASS GRAFT Left 08/16/2017   Procedure: LEFT  FEMORAL-POPLITEAL ARTERY  BYPASS GRAFT;  Surgeon: [name], MD;  Location: [place];  Service: Vascular;  Laterality: Left;   GIVENS CAPSULE STUDY [name] 03/30/2014   Procedure: GIVENS CAPSULE STUDY;  Surgeon: Danie Binder, MD;  [facility];  Service: Endoscopy;  Laterality: [name];  7:30   GROIN DEBRIDEMENT Right 04/07/2020   Procedure: INCISION AND DRAINAGE OF RIGHT GROIN;  Surgeon: [name], MD;  [facility];  Service: Vascular;  Laterality: Right;   I & D EXTREMITY Left 09/03/2017   Procedure: IRRIGATION AND DEBRIDEMENT EXTREMITY LEFT THIGH SEROMA;  Surgeon: Conrad [place], MD;  Location: [place];  Service: Vascular;  Laterality: Left;   INSERT / REPLACE  / [place]  03/31/2020   Procedure: INSERTION OF RIGHT ILIAC ARTERY STENT AND RIGHT SUPERFICIAL FEMORAL ARTERY STENT;  Surgeon: [name], MD;  [facility];  Service: Vascular;;   IR ANGIO INTRA EXTRACRAN SEL COM CAROTID INNOMINATE BILAT MOD SED  07/16/2017   IR ANGIO INTRA EXTRACRAN SEL COM CAROTID INNOMINATE BILAT MOD SED  06/12/2019   IR ANGIO INTRA EXTRACRAN SEL COM CAROTID INNOMINATE BILAT MOD SED  01/17/2021   IR ANGIO VERTEBRAL SEL SUBCLAVIAN INNOMINATE BILAT MOD SED  07/16/2017   IR ANGIO VERTEBRAL SEL SUBCLAVIAN INNOMINATE BILAT MOD SED  06/12/2019   IR ANGIO VERTEBRAL SEL SUBCLAVIAN INNOMINATE BILAT MOD SED  01/17/2021   IR RADIOLOGIST EVAL & MGMT  04/01/2017   IR RADIOLOGIST EVAL & MGMT  08/02/2017   IR TRANSCATH EXCRAN VERT OR CAR A STENT  07/22/2019   IR US GUIDE VASC ACCESS RIGHT  06/12/2019   IR US GUIDE VASC ACCESS RIGHT  01/17/2021   JOINT REPLACEMENT Right    Total Shoulder Replacement    LOWER EXTREMITY ANGIOGRAPHY Bilateral 06/25/2017   Procedure: Lower Extremity Angiography;  Surgeon: [name], MD;  Location: [place] CV LAB;  Service: Cardiovascular;  Laterality: Bilateral;   LUMBAR FUSION  2010   PATCH ANGIOPLASTY Right 03/31/2020   Procedure: PATCH ANGIOPLASTY USING XENOSURE BIOLOGIC PATCH 1cm x 14cm;  Surgeon: [name], MD;  Location: [facility] OR;  Service: Vascular;  Laterality: Right;   PERIPHERAL VASCULAR ATHERECTOMY Right 04/22/2018   Procedure: PERIPHERAL VASCULAR ATHERECTOMY;  Surgeon: [name], MD;  Location: [place] CV LAB;  Service: Cardiovascular;  Laterality: Right;  right superficial femoral   PERIPHERAL VASCULAR BALLOON ANGIOPLASTY Right 04/22/2018   Procedure: PERIPHERAL VASCULAR BALLOON ANGIOPLASTY;  Surgeon: [name], MD;  Location: [place] CV LAB;  Service: Cardiovascular;  Laterality: Right;  external iliac   PERIPHERAL VASCULAR BALLOON ANGIOPLASTY Left 03/08/2020   Procedure:  PERIPHERAL VASCULAR BALLOON ANGIOPLASTY;  Surgeon: [name], MD;  Location: [place] CV LAB;  Service: Cardiovascular;  Laterality: Left;  Common Femoral   PERIPHERAL VASCULAR BALLOON ANGIOPLASTY Left 08/23/2020   Procedure: PERIPHERAL VASCULAR BALLOON ANGIOPLASTY;  Surgeon: [name], MD;  Location: [place] CV LAB;  Service: Cardiovascular;  Laterality: Left;  common femoral   PERIPHERAL VASCULAR INTERVENTION Right 03/08/2020   Procedure: PERIPHERAL VASCULAR INTERVENTION;  Surgeon: [name], MD;  Location: [place] CV LAB;  Service: Cardiovascular;  Laterality: Right;  SFA   PERIPHERAL VASCULAR INTERVENTION Right 08/23/2020   Procedure: PERIPHERAL VASCULAR INTERVENTION;  Surgeon: [name], MD;  Location: [place] CV LAB;  Service: Cardiovascular;  Laterality: Right;  external iliac   RADIOLOGY WITH ANESTHESIA [name] 07/01/2019   Procedure: STENTING;  Surgeon: Luanne Bras, MD;  Location: [place];  Service: Radiology;  Laterality: [name];   RADIOLOGY WITH ANESTHESIA [name] 07/22/2019   Procedure: STENTING;  Surgeon: Luanne Bras, MD;  Location: [place];  Service: Radiology;  Laterality: [name];   TOTAL SHOULDER ARTHROPLASTY Right 2011   Dr. Rafael Bihari NERVE TRANSPOSITION Left 02/02/2016   Procedure: LEFT IN-SITU DECOMPRESSION ULNAR NERVE ;  Surgeon: Daryll Brod, MD;  Location: [place];  Service: Orthopedics;  Laterality: Left;   ULNAR TUNNEL RELEASE Left 02/02/2016   Procedure: LEFT CUBITAL TUNNEL RELEASE;  Surgeon: Daryll Brod, MD;  Location: [place];  Service: Orthopedics;  Laterality: Left;   VASECTOMY  1971   VEIN HARVEST Left 08/16/2017   Procedure: USING NON REVERSE LEFT GREATER SAPHENOUS VEIN HARVEST;  Surgeon: [name], MD;  Location: [place];  Service: Vascular;  Laterality: Left;     No Known Allergies    Family History  Problem Relation Age of Onset   Coronary artery disease Mother    Diabetes Mother     Heart disease Mother        Before age 22 - 72 Bypasses   Hypertension Mother    Heart attack Mother        3-4 Heart attacks   Alzheimer's disease Father    Diabetes Father    Heart attack Sister    Cancer Brother        Prostate   Hyperlipidemia Son    Prostate cancer Brother    Alzheimer's disease Sister    Colon cancer Neg Hx      Social History Mr. Ishida reports that he has been smoking cigarettes. He started smoking about 64 years ago. He has a 60.00 pack-year smoking history. He has never used smokeless tobacco. Mr. Fults reports no history of alcohol use.   Review of Systems CONSTITUTIONAL: No weight loss, fever, chills, weakness or fatigue.  HEENT: Eyes: No visual loss, blurred vision, double vision or yellow sclerae.No hearing loss, sneezing, congestion, runny nose or sore throat.  SKIN: No rash or itching.  CARDIOVASCULAR: per hpi RESPIRATORY: No shortness of breath, cough or sputum.  GASTROINTESTINAL: No anorexia, nausea, vomiting or diarrhea. No abdominal pain or blood.  GENITOURINARY: No burning on urination, no polyuria NEUROLOGICAL: No headache, dizziness, syncope, paralysis, ataxia, numbness or tingling in the extremities. No change in bowel or bladder control.  MUSCULOSKELETAL: No muscle, back pain, joint pain or stiffness.  LYMPHATICS: No enlarged nodes. No history of splenectomy.  PSYCHIATRIC: No history of depression or anxiety.  ENDOCRINOLOGIC: No reports of sweating, cold or heat intolerance. No polyuria or polydipsia.  [name]   Physical Examination Today's Vitals   10/24/21 0839  BP: (!) 162/70  Pulse: 60  SpO2: 98%  Weight: 152 lb (68.9 kg)  Height: 5\' 3"  (1.6 m)   Body mass index is 26.93 kg/m.  Gen: resting comfortably, no acute distress HEENT: no scleral icterus, pupils equal round and reactive, no palptable cervical adenopathy,  CV: RRR, no m/r/g no jvd Resp: Clear to auscultation bilaterally GI: abdomen is soft, non-tender,  non-distended, normal bowel sounds, no hepatosplenomegaly MSK:  extremities are warm, no edema.  Skin: warm, no rash Neuro:  no focal deficits Psych: appropriate affect     Assessment and Plan  CAD -history of NSTEMI in 2015 managed medically due to AMS and renal dysfunction at the time - nuclear imaging has suggested inferior scar, has not had cardiac cath it appears - no recent symptoms, continue current meds. DAPT is for his history of ICA stent, not for cardiac reason, and we defer dosing and duration to Dr Estanislado Pandy  2. Chronic systolic HF - LVEF has normalized, continue current meds - with HTN increasing entresto to 97/103mg  bid  3. PAD - per vascular - continue medical therapy  4. Hyperlipidemia - request pcp labs, cotninue statin.       Arnoldo Lenis, M.D.

## 2021-10-24 NOTE — Telephone Encounter (Signed)
*  STAT* If patient is at the pharmacy, call can be transferred to refill team.   1. Which medications need to be refilled? (please list name of each medication and dose if known)  sacubitril-valsartan (ENTRESTO) 97-103 MG  2. Which pharmacy/location (including street and city if local pharmacy) is medication to be sent to? [facility] [place], [place]  3. Do they need a 30 day or 90 day supply?  90 day supply   Is only picking up a 30 day supply at [facility] and is [facility]

## 2021-10-24 NOTE — Telephone Encounter (Signed)
Done

## 2021-10-24 NOTE — Patient Instructions (Signed)
Medication Instructions:  Increase Entresto to 97/103mg  twice a day Continue all other medications.     Labwork: BMET - order given today Please do in 2 weeks (around 11/07/2021) Office will contact with results via phone or letter.     Testing/Procedures: none  Follow-Up: Your physician wants you to follow up in: 6 months.  You will receive a reminder letter in the mail one-two months in advance.  If you don't receive a letter, please call our office to schedule the follow up appointment   Any Other Special Instructions Will Be Listed Below (If Applicable).   If you need a refill on your cardiac medications before your next appointment, please call your pharmacy.

## 2021-10-26 ENCOUNTER — Encounter: Payer: Self-pay | Admitting: *Deleted

## 2021-10-31 ENCOUNTER — Ambulatory Visit (HOSPITAL_COMMUNITY)
Admission: RE | Admit: 2021-10-31 | Discharge: 2021-10-31 | Disposition: A | Discharge status: Home / Self Care | Payer: Medicare Other | Source: Ambulatory Visit | Attending: Surgery | Admitting: Surgery

## 2021-10-31 ENCOUNTER — Other Ambulatory Visit: Payer: Self-pay

## 2021-10-31 ENCOUNTER — Encounter (HOSPITAL_COMMUNITY)
Admission: RE | Disposition: A | Discharge status: Home / Self Care | Payer: Self-pay | Source: Ambulatory Visit | Attending: Surgery

## 2021-10-31 DIAGNOSIS — I70213 Atherosclerosis of native arteries of extremities with intermittent claudication, bilateral legs: Secondary | ICD-10-CM | POA: Diagnosis not present

## 2021-10-31 DIAGNOSIS — I708 Atherosclerosis of other arteries: Secondary | ICD-10-CM | POA: Diagnosis not present

## 2021-10-31 DIAGNOSIS — Z95828 Presence of other vascular implants and grafts: Secondary | ICD-10-CM | POA: Diagnosis not present

## 2021-10-31 DIAGNOSIS — T82856A Stenosis of peripheral vascular stent, initial encounter: Secondary | ICD-10-CM | POA: Diagnosis not present

## 2021-10-31 DIAGNOSIS — I739 Peripheral vascular disease, unspecified: Secondary | ICD-10-CM | POA: Insufficient documentation

## 2021-10-31 HISTORY — PX: ABDOMINAL AORTOGRAM W/LOWER EXTREMITY: CATH118223

## 2021-10-31 HISTORY — PX: PERIPHERAL VASCULAR BALLOON ANGIOPLASTY: CATH118281

## 2021-10-31 HISTORY — PX: PERIPHERAL VASCULAR INTERVENTION: CATH118257

## 2021-10-31 LAB — POCT I-STAT, CHEM 8
BUN: 12 mg/dL (ref 8–23)
Calcium, Ion: 1.12 mmol/L — ABNORMAL LOW (ref 1.15–1.40)
Chloride: 102 mmol/L (ref 98–111)
Creatinine, Ser: 0.7 mg/dL (ref 0.61–1.24)
Glucose, Bld: 116 mg/dL — ABNORMAL HIGH (ref 70–99)
HCT: 46 % (ref 39.0–52.0)
Hemoglobin: 15.6 g/dL (ref 13.0–17.0)
Potassium: 4.6 mmol/L (ref 3.5–5.1)
Sodium: 136 mmol/L (ref 135–145)
TCO2: 25 mmol/L (ref 22–32)

## 2021-10-31 LAB — POCT ACTIVATED CLOTTING TIME
Activated Clotting Time: 287 seconds
Activated Clotting Time: 191 seconds
Activated Clotting Time: 167 seconds

## 2021-10-31 SURGERY — ABDOMINAL AORTOGRAM W/LOWER EXTREMITY
Anesthesia: LOCAL | Laterality: Right

## 2021-10-31 MED ORDER — MIDAZOLAM HCL 2 MG/2ML IJ SOLN
INTRAMUSCULAR | Status: DC | PRN
  Administered 2021-10-31: 2 mg via INTRAVENOUS
  Administered 2021-10-31 (×2): 1 mg via INTRAVENOUS

## 2021-10-31 MED ORDER — MIDAZOLAM HCL 2 MG/2ML IJ SOLN
INTRAMUSCULAR | Status: AC
  Filled 2021-10-31: qty 2

## 2021-10-31 MED ORDER — LIDOCAINE HCL (PF) 1 % IJ SOLN
INTRAMUSCULAR | Status: DC | PRN
  Administered 2021-10-31: 15 mL via INTRADERMAL

## 2021-10-31 MED ORDER — FENTANYL CITRATE (PF) 100 MCG/2ML IJ SOLN
INTRAMUSCULAR | Status: AC
  Filled 2021-10-31: qty 2

## 2021-10-31 MED ORDER — ACETAMINOPHEN 325 MG PO TABS: 650.0000 mg | ORAL_TABLET | ORAL | Status: DC | PRN

## 2021-10-31 MED ORDER — HEPARIN SODIUM (PORCINE) 1000 UNIT/ML IJ SOLN
INTRAMUSCULAR | Status: DC | PRN
  Administered 2021-10-31: 7000 [IU] via INTRAVENOUS

## 2021-10-31 MED ORDER — MORPHINE SULFATE (PF) 2 MG/ML IV SOLN: 2.0000 mg | INTRAVENOUS | Status: DC | PRN

## 2021-10-31 MED ORDER — HEPARIN (PORCINE) IN NACL 1000-0.9 UT/500ML-% IV SOLN
INTRAVENOUS | Status: DC | PRN
  Administered 2021-10-31 (×2): 500 mL

## 2021-10-31 MED ORDER — LABETALOL HCL 5 MG/ML IV SOLN: 10.0000 mg | INTRAVENOUS | Status: DC | PRN

## 2021-10-31 MED ORDER — ONDANSETRON HCL 4 MG/2ML IJ SOLN: 4.0000 mg | Freq: Four times a day (QID) | INTRAMUSCULAR | Status: DC | PRN

## 2021-10-31 MED ORDER — SODIUM CHLORIDE 0.9 % IV SOLN: 250.0000 mL | INTRAVENOUS | Status: DC | PRN

## 2021-10-31 MED ORDER — FENTANYL CITRATE (PF) 100 MCG/2ML IJ SOLN
INTRAMUSCULAR | Status: DC | PRN
  Administered 2021-10-31: 50 ug via INTRAVENOUS
  Administered 2021-10-31 (×2): 25 ug via INTRAVENOUS

## 2021-10-31 MED ORDER — IODIXANOL 320 MG/ML IV SOLN
INTRAVENOUS | Status: DC | PRN
  Administered 2021-10-31: 150 mL via INTRA_ARTERIAL

## 2021-10-31 MED ORDER — SODIUM CHLORIDE 0.9% FLUSH: 3.0000 mL | Freq: Two times a day (BID) | INTRAVENOUS | Status: DC

## 2021-10-31 MED ORDER — LIDOCAINE HCL (PF) 1 % IJ SOLN
INTRAMUSCULAR | Status: AC
  Filled 2021-10-31: qty 30

## 2021-10-31 MED ORDER — HYDRALAZINE HCL 20 MG/ML IJ SOLN: 5.0000 mg | INTRAMUSCULAR | Status: DC | PRN

## 2021-10-31 MED ORDER — SODIUM CHLORIDE 0.9 % IV SOLN: INTRAVENOUS | Status: DC

## 2021-10-31 MED ORDER — HEPARIN (PORCINE) IN NACL 1000-0.9 UT/500ML-% IV SOLN
INTRAVENOUS | Status: AC
  Filled 2021-10-31: qty 1000

## 2021-10-31 MED ORDER — HEPARIN SODIUM (PORCINE) 1000 UNIT/ML IJ SOLN
INTRAMUSCULAR | Status: AC
  Filled 2021-10-31: qty 10

## 2021-10-31 MED ORDER — OXYCODONE HCL 5 MG PO TABS: 5.0000 mg | ORAL_TABLET | ORAL | Status: DC | PRN

## 2021-10-31 MED ORDER — SODIUM CHLORIDE 0.9% FLUSH: 3.0000 mL | INTRAVENOUS | Status: DC | PRN

## 2021-10-31 MED ORDER — SODIUM CHLORIDE 0.9 % WEIGHT BASED INFUSION: 1.0000 mL/kg/h | INTRAVENOUS | Status: DC

## 2021-10-31 SURGICAL SUPPLY — 16 items
BAG SNAP BAND KOVER 36X36 (MISCELLANEOUS) ×1 IMPLANT
BALLN MUSTANG 6.0X40 75 (BALLOONS) ×4
BALLOON MUSTANG 6.0X40 75 (BALLOONS) IMPLANT
CATH OMNI FLUSH 5F 65CM (CATHETERS) ×1 IMPLANT
KIT MICROPUNCTURE NIT STIFF (SHEATH) ×1 IMPLANT
KIT PV (KITS) ×4 IMPLANT
SHEATH PINNACLE 5F 10CM (SHEATH) ×1 IMPLANT
SHEATH PINNACLE 6F 10CM (SHEATH) ×1 IMPLANT
SHEATH PINNACLE ST 6F 45CM (SHEATH) ×1 IMPLANT
SHEATH PROBE COVER 6X72 (BAG) ×1 IMPLANT
STENT ELUVIA 7X40X130 (Permanent Stent) ×1 IMPLANT
SYR MEDRAD MARK V 150ML (SYRINGE) ×1 IMPLANT
TRANSDUCER W/STOPCOCK (MISCELLANEOUS) ×4 IMPLANT
TRAY PV CATH (CUSTOM PROCEDURE TRAY) ×4 IMPLANT
WIRE BENTSON .035X145CM (WIRE) ×1 IMPLANT
WIRE HI TORQ VERSACORE J 260CM (WIRE) ×1 IMPLANT

## 2021-10-31 NOTE — Progress Notes (Addendum)
Site area: Right groin a 6 french arterial sheath was removed  Site Prior to Removal:  Level 0  Pressure Applied For 30 MINUTES    Bedrest Beginning at 1505P x 4 HOURS  Manual:   No.  Patient Status During Pull:  stable  Post Pull Groin Site:  Level 0  Post Pull Instructions Given:  Yes.    Post Pull Pulses Present:  Yes.    Dressing Applied:  Yes.    Comments:

## 2021-10-31 NOTE — Interval H&P Note (Signed)
History and Physical Interval Note:  10/31/2021 9:49 AM  Roy [name]  has presented today for surgery, with the diagnosis of claudication.  The various methods of treatment have been discussed with the patient and family. After consideration of risks, benefits and other options for treatment, the patient has consented to  Procedure(s): ABDOMINAL AORTOGRAM W/LOWER EXTREMITY ([name]) as a surgical intervention.  The patient's history has been reviewed, patient examined, no change in status, stable for surgery.  I have reviewed the patient's chart and labs.  Questions were answered to the patient's satisfaction.     Annamarie Major

## 2021-10-31 NOTE — Op Note (Signed)
Patient name: Roy [name] MRN: [id number] DOB: 05/17/1946 Sex: male  10/31/2021 Pre-operative Diagnosis: claudication Post-operative diagnosis:  Same Surgeon:  Annamarie Major Procedure Performed:  1.  Ultrasound-guided access, left femoral artery  2.  Abdominal drain  3.  Bilateral lower extremity runoff  4.  Stent, right super femoral artery  5.  Angioplasty, left external iliac artery  6.  Conscious sedation, 56 minutes   Indications: This is a 75 year old gentleman with multiple prior interventions who was found to have progressively elevating velocities within his left external iliac artery and right superficial femoral artery.  He comes in today for further evaluation.  Procedure:  The patient was identified in the holding area and taken to room 8.  The patient was then placed supine on the table and prepped and draped in the usual sterile fashion.  A time out was called.  Conscious sedation was administered with the use of IV fentanyl and Versed under continuous physician and nurse monitoring.  Heart rate, blood pressure, and oxygen saturation were continuously monitored.  Total sedation time was 40minutes.  Ultrasound was used to evaluate the left common femoral artery.  It was patent .  A digital ultrasound image was acquired.  A micropuncture needle was used to access the left common femoral artery under ultrasound guidance.  An 018 wire was advanced without resistance and a micropuncture sheath was placed.  The 018 wire was removed and a benson wire was placed.  The micropuncture sheath was exchanged for a 5 french sheath.  An omniflush catheter was advanced over the wire to the level of L-1.  An abdominal angiogram was obtained.  Next, using the omniflush catheter and a benson wire, the aortic bifurcation was crossed and the catheter was placed into theright external iliac artery and right runoff was obtained.  left runoff was performed via retrograde sheath injections.  Findings:    Aortogram: No significant renal artery stenosis was identified.  The infrarenal abdominal aorta is widely patent without stenosis.  There is a stent within the right common iliac artery which is widely patent as is the stent within the   right external iliac artery.  The left common iliac artery is patent without significant stenosis.  There is a stent within the left external iliac artery with approximately 60% stenosis at the proximal edge.     Right Lower Extremity:   the right common femoral artery is widely patent.  The profundofemoral artery has a 50% stenosis approximately 1 cm from its origin.  There is greater than 60% stenosis within the first 2 cm of the superficial femoral artery which are not covered by stents.  The remaining portion of the stents within the superficial femoral artery and the native artery are patent without significant stenosis.  The popliteal artery is widely patent.  There is three-vessel runoff.  Left Lower Extremity:   The left common femoral artery is dilated consistent with previous patch angioplasty.  There is narrowing of the proximal common femoral artery however I think this is just the size of the artery and not a true stenosis.  The bypass graft between the left common femoral artery and the popliteal artery is widely patent.  No significant anastomotic stenosis.  Tibial vessels were not well visualized.  Intervention: After the above images were acquired the decision made to proceed with intervention.  A 6 French 45 cm sheath was advanced over the aortic bifurcation and placed in the right external iliac artery.  The  patient fully heparinized.  A 035 wire was then advanced across the stenosis within the proximal superficial femoral artery which I elected to stent primarily.  A 7 x 40 Elluvia stent was placed across the lesion and fully deployed and postdilated with a 6 mm balloon.  Completion imaging revealed resolution of the stenosis.  I then pulled the sheath  back into the left external iliac artery.  I treated the left external iliac artery stent stenosis with the 6 mm Mustang balloon.  Completion imaging showed resolution of the stenosis.  At this point catheters and wires were removed.  The decision made not to close the groin because there was a residual plaque that was relatively immobile in the distal external iliac artery.  Impression:  #1  Proximal right superficial femoral artery stenosis treated using a 7 mm Elluvia stent  #2  Widely patent left femoral-popliteal bypass graft  #3  Angioplasty of in-stent left external iliac artery stenosis with a 6 mm balloon    V. Annamarie Major, M.D., [facility] [place] Office: [phone] Pager:  [phone]

## 2021-11-01 ENCOUNTER — Encounter (HOSPITAL_COMMUNITY): Payer: Self-pay | Admitting: Surgery

## 2021-11-01 LAB — POCT I-STAT, CHEM 8
BUN: 17 mg/dL (ref 8–23)
Calcium, Ion: 0.92 mmol/L — ABNORMAL LOW (ref 1.15–1.40)
Chloride: 107 mmol/L (ref 98–111)
Creatinine, Ser: 0.7 mg/dL (ref 0.61–1.24)
Glucose, Bld: 114 mg/dL — ABNORMAL HIGH (ref 70–99)
HCT: 45 % (ref 39.0–52.0)
Hemoglobin: 15.3 g/dL (ref 13.0–17.0)
Potassium: 7.6 mmol/L (ref 3.5–5.1)
Sodium: 132 mmol/L — ABNORMAL LOW (ref 135–145)
TCO2: 25 mmol/L (ref 22–32)

## 2021-11-07 DIAGNOSIS — I10 Essential (primary) hypertension: Secondary | ICD-10-CM | POA: Diagnosis not present

## 2021-11-07 DIAGNOSIS — I5022 Chronic systolic (congestive) heart failure: Secondary | ICD-10-CM | POA: Diagnosis not present

## 2021-11-08 LAB — BASIC METABOLIC PANEL
BUN/Creatinine Ratio: 23 (calc) — ABNORMAL HIGH (ref 6–22)
BUN: 15 mg/dL (ref 7–25)
CO2: 28 mmol/L (ref 20–32)
Calcium: 9.2 mg/dL (ref 8.6–10.3)
Chloride: 100 mmol/L (ref 98–110)
Creat: 0.64 mg/dL — ABNORMAL LOW (ref 0.70–1.28)
Glucose, Bld: 71 mg/dL (ref 65–139)
Potassium: 4.8 mmol/L (ref 3.5–5.3)
Sodium: 136 mmol/L (ref 135–146)

## 2021-11-15 DIAGNOSIS — E782 Mixed hyperlipidemia: Secondary | ICD-10-CM | POA: Diagnosis not present

## 2021-11-15 DIAGNOSIS — R7303 Prediabetes: Secondary | ICD-10-CM | POA: Diagnosis not present

## 2021-11-16 DIAGNOSIS — R109 Unspecified abdominal pain: Secondary | ICD-10-CM | POA: Diagnosis not present

## 2021-11-21 ENCOUNTER — Other Ambulatory Visit (HOSPITAL_COMMUNITY): Payer: Self-pay | Admitting: Family Medicine

## 2021-11-21 ENCOUNTER — Other Ambulatory Visit: Payer: Self-pay | Admitting: Family Medicine

## 2021-11-21 DIAGNOSIS — I5022 Chronic systolic (congestive) heart failure: Secondary | ICD-10-CM | POA: Diagnosis not present

## 2021-11-21 DIAGNOSIS — R109 Unspecified abdominal pain: Secondary | ICD-10-CM

## 2021-11-21 DIAGNOSIS — K7460 Unspecified cirrhosis of liver: Secondary | ICD-10-CM | POA: Diagnosis not present

## 2021-11-21 DIAGNOSIS — I10 Essential (primary) hypertension: Secondary | ICD-10-CM | POA: Diagnosis not present

## 2021-11-21 DIAGNOSIS — E875 Hyperkalemia: Secondary | ICD-10-CM | POA: Diagnosis not present

## 2021-11-21 DIAGNOSIS — R7303 Prediabetes: Secondary | ICD-10-CM | POA: Diagnosis not present

## 2021-11-21 DIAGNOSIS — I739 Peripheral vascular disease, unspecified: Secondary | ICD-10-CM | POA: Diagnosis not present

## 2021-11-21 DIAGNOSIS — D509 Iron deficiency anemia, unspecified: Secondary | ICD-10-CM | POA: Diagnosis not present

## 2021-11-21 DIAGNOSIS — R339 Retention of urine, unspecified: Secondary | ICD-10-CM | POA: Diagnosis not present

## 2021-11-21 DIAGNOSIS — F17200 Nicotine dependence, unspecified, uncomplicated: Secondary | ICD-10-CM | POA: Diagnosis not present

## 2021-11-21 DIAGNOSIS — Z0001 Encounter for general adult medical examination with abnormal findings: Secondary | ICD-10-CM | POA: Diagnosis not present

## 2021-11-21 DIAGNOSIS — E782 Mixed hyperlipidemia: Secondary | ICD-10-CM | POA: Diagnosis not present

## 2021-11-21 DIAGNOSIS — M545 Low back pain, unspecified: Secondary | ICD-10-CM | POA: Diagnosis not present

## 2021-11-22 ENCOUNTER — Encounter: Payer: Self-pay | Admitting: *Deleted

## 2021-11-22 ENCOUNTER — Telehealth: Payer: Self-pay | Admitting: *Deleted

## 2021-11-22 NOTE — Telephone Encounter (Signed)
Laurine Blazer, LPN  [id number]  3:40 AM EST Back to Top    Patient notified via letter.  Copy to pcp.     Arnoldo Lenis, MD  11/20/2021 11:10 AM EST     Normal labs   Zandra Abts MD

## 2021-11-23 ENCOUNTER — Other Ambulatory Visit: Payer: Self-pay | Admitting: *Deleted

## 2021-11-23 DIAGNOSIS — I70213 Atherosclerosis of native arteries of extremities with intermittent claudication, bilateral legs: Secondary | ICD-10-CM

## 2021-11-23 DIAGNOSIS — I739 Peripheral vascular disease, unspecified: Secondary | ICD-10-CM

## 2021-11-26 DIAGNOSIS — Z20822 Contact with and (suspected) exposure to covid-19: Secondary | ICD-10-CM | POA: Diagnosis not present

## 2021-12-04 ENCOUNTER — Other Ambulatory Visit: Payer: Self-pay

## 2021-12-04 ENCOUNTER — Ambulatory Visit (HOSPITAL_COMMUNITY)
Admission: RE | Admit: 2021-12-04 | Discharge: 2021-12-04 | Disposition: A | Discharge status: Home / Self Care | Payer: Medicare Other | Source: Ambulatory Visit | Attending: Family Medicine | Admitting: Family Medicine

## 2021-12-04 DIAGNOSIS — R109 Unspecified abdominal pain: Secondary | ICD-10-CM | POA: Diagnosis not present

## 2021-12-11 ENCOUNTER — Ambulatory Visit (INDEPENDENT_AMBULATORY_CARE_PROVIDER_SITE_OTHER)
Admission: RE | Admit: 2021-12-11 | Discharge: 2021-12-11 | Disposition: A | Discharge status: Home / Self Care | Payer: Medicare Other | Source: Ambulatory Visit | Attending: Surgery | Admitting: Surgery

## 2021-12-11 ENCOUNTER — Other Ambulatory Visit: Payer: Self-pay

## 2021-12-11 ENCOUNTER — Ambulatory Visit (HOSPITAL_COMMUNITY)
Admission: RE | Admit: 2021-12-11 | Discharge: 2021-12-11 | Disposition: A | Discharge status: Home / Self Care | Payer: Medicare Other | Source: Ambulatory Visit | Attending: Surgery | Admitting: Surgery

## 2021-12-11 ENCOUNTER — Ambulatory Visit (INDEPENDENT_AMBULATORY_CARE_PROVIDER_SITE_OTHER): Payer: Medicare Other | Admitting: Physician Assistant

## 2021-12-11 VITALS — BP 148/74 | HR 62 | Temp 97.5°F | Resp 20 | Ht 63.0 in | Wt 148.2 lb

## 2021-12-11 DIAGNOSIS — I739 Peripheral vascular disease, unspecified: Secondary | ICD-10-CM

## 2021-12-11 DIAGNOSIS — I70213 Atherosclerosis of native arteries of extremities with intermittent claudication, bilateral legs: Secondary | ICD-10-CM

## 2021-12-11 DIAGNOSIS — I2510 Atherosclerotic heart disease of native coronary artery without angina pectoris: Secondary | ICD-10-CM | POA: Insufficient documentation

## 2021-12-11 DIAGNOSIS — K7460 Unspecified cirrhosis of liver: Secondary | ICD-10-CM | POA: Insufficient documentation

## 2021-12-11 NOTE — Progress Notes (Signed)
Office Note     CC:  follow up Requesting Provider:  Celene Squibb, MD  HPI: Roy [name] is a 76 y.o. (03-12-1946) male who presents status post aortogram with angioplasty of left external iliac artery and stenting of right SFA by Dr. [name] on 10/31/2021.  He returns today for postoperative surveillance with noninvasive imaging.  He denies any claudication symptoms.  He also denies any rest pain or tissue loss of bilateral lower extremities.  He denies any pain or trouble from his left groin catheterization site.  He continues to smoke daily with no interest in quitting.  He is on aspirin, Brilinta, and statin daily.   Past Medical History:  Diagnosis Date   AICD (automatic cardioverter/defibrillator) present 08/19/2015   St Jude BiV ICD for primary prevention by Dr. Lovena Le   Alcohol abuse    6 beers per day; hospital admission in 2009 for withdrawal symptoms   Alcoholic cirrhosis ([place])    Anxiety and depression    denies    Cerebrovascular disease 2009   TIA; 2009- right ICA stent; re-intervention for restenosis complicated by [facility] w/o sx   CHF (congestive heart failure) ([place]) 06-02-14   Chronic obstructive pulmonary disease (HCC)    Degenerative joint disease    Total shoulder arthroplasty-right   Hyperlipidemia    Hypertension    LBBB (left bundle branch block)    Normal echo-2011; stress nuclear in 09/2010--septal hypoperfusion representing nontransmural infarction or the effect of left bundle branch block, no ischemia   Myocardial infarction [facility]) June 02, 2014   Massive Heart Attack   Peripheral vascular disease ([place])    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   Thrombocytopenia (HCC)    Tobacco abuse    -100 pack years; 1.5 packs per day   Traumatic seroma of thigh (HCC)    left   Tubular adenoma of colon     Past Surgical History:  Procedure Laterality Date   ABDOMINAL AORTAGRAM [name] 05/04/2014   Procedure: ABDOMINAL Maxcine Ham;  Surgeon: [name], MD;   Location: [facility] CATH LAB;  Service: Cardiovascular;  Laterality: [name];   ABDOMINAL AORTOGRAM [name] 06/25/2017   Procedure: Abdominal Aortogram;  Surgeon: [name], MD;  Location: [place] CV LAB;  Service: Cardiovascular;  Laterality: [name];   ABDOMINAL AORTOGRAM W/LOWER EXTREMITY [name] 04/22/2018   Procedure: ABDOMINAL AORTOGRAM W/LOWER EXTREMITY;  Surgeon: [name], MD;  Location: [place] CV LAB;  Service: Cardiovascular;  Laterality: [name];   ABDOMINAL AORTOGRAM W/LOWER EXTREMITY [name] 03/08/2020   Procedure: ABDOMINAL AORTOGRAM W/LOWER EXTREMITY;  Surgeon: [name], MD;  Location: [place] CV LAB;  Service: Cardiovascular;  Laterality: [name];   ABDOMINAL AORTOGRAM W/LOWER EXTREMITY Bilateral 08/23/2020   Procedure: ABDOMINAL AORTOGRAM W/LOWER EXTREMITY;  Surgeon: [name], MD;  Location: [place] CV LAB;  Service: Cardiovascular;  Laterality: Bilateral;   ABDOMINAL AORTOGRAM W/LOWER EXTREMITY [name] 10/31/2021   Procedure: ABDOMINAL AORTOGRAM W/LOWER EXTREMITY;  Surgeon: [name], MD;  Location: [place] CV LAB;  Service: Cardiovascular;  Laterality: [name];   AGILE CAPSULE [name] 03/18/2014   Procedure: AGILE CAPSULE;  Surgeon: Danie Binder, MD;  [facility];  Service: Endoscopy;  Laterality: [name];  5:78   APPLICATION OF WOUND VAC Left 09/03/2017   Procedure: APPLICATION OF WOUND VAC;  Surgeon: Conrad Chesterfield, MD;  Location: [place];  Service: Vascular;  Laterality: Left;   APPLICATION OF WOUND VAC Right 04/07/2020   Procedure: PLACEMENT OF ANTIBIOTIC BEADS  AND APPLICATION OF WOUND VAC;  Surgeon: [name], MD;  [facility];  Service: Vascular;  Laterality: Right;   BACK SURGERY     BACTERIAL OVERGROWTH TEST [name] 05/24/2015   Procedure: BACTERIAL OVERGROWTH TEST;  Surgeon: Danie Binder, MD;  [facility];  Service: Endoscopy;  Laterality: [name];  0700   BI-VENTRICULAR IMPLANTABLE CARDIOVERTER DEFIBRILLATOR  (CRT-D)  08/19/2015   CARPAL  TUNNEL RELEASE Left 02/02/2016   Procedure: LEFT CARPAL TUNNEL RELEASE;  Surgeon: Daryll Brod, MD;  Location: [place];  Service: Orthopedics;  Laterality: Left;  ANESTHESIA: IV REGIONAL UPPER ARM   CATARACT EXTRACTION W/PHACO Right 03/08/2015   Procedure: CATARACT EXTRACTION PHACO AND INTRAOCULAR LENS PLACEMENT (IOC);  Surgeon: Rutherford Guys, MD;  Location: [facility];  Service: Ophthalmology;  Laterality: Right;  CDE:9.46   CATARACT EXTRACTION W/PHACO Left 03/22/2015   Procedure: CATARACT EXTRACTION PHACO AND INTRAOCULAR LENS PLACEMENT (IOC);  Surgeon: Rutherford Guys, MD;  [facility]: [facility];  Service: Ophthalmology;  Laterality: Left;  CDE:5.80   COLONOSCOPY  08/22/09   Fields-(Tubular Adenoma)3-mm transverse polyp/4-mm polyp otherwise noraml/small internal hemorrhoids   COLONOSCOPY [name] 04/14/2014   ION:GEXBM internal hemorrhids/normal mocsa in the terminal iluem/left colonis redundant   COLONOSCOPY W/ POLYPECTOMY  2011   ENDARTERECTOMY FEMORAL Left 08/16/2017   Procedure: ENDARTERECTOMY LEFT PROFUNDA FEMORAL;  Surgeon: [name], MD;  [facility] OR;  Service: Vascular;  Laterality: Left;   ENDARTERECTOMY FEMORAL Right 03/31/2020   Procedure: RIGHT FEMORAL ENDARTERECTOMY;  Surgeon: [name], MD;  Location: [place];  Service: Vascular;  Laterality: Right;   EP IMPLANTABLE DEVICE [name] 08/19/2015   Procedure: BiV ICD Insertion CRT-D;  Surgeon: Evans Lance, MD;  Location: [place] CV LAB;  Service: Cardiovascular;  Laterality: [name];   ESOPHAGOGASTRODUODENOSCOPY [name] 03/05/2014   SLF: 1. Stricture at the gastroesophagael junction 2. Small hiatal hernia 3. Moderate non-erosive gastritis and duodentitis. 4. No surce for Melena identified.    FEMORAL-POPLITEAL BYPASS GRAFT Left 08/16/2017   Procedure: LEFT  FEMORAL-POPLITEAL ARTERY  BYPASS GRAFT;  Surgeon: [name], MD;  Location: [place];  Service: Vascular;  Laterality: Left;   GIVENS CAPSULE STUDY [name] 03/30/2014   Procedure:  GIVENS CAPSULE STUDY;  Surgeon: Danie Binder, MD;  [facility];  Service: Endoscopy;  Laterality: [name];  7:30   GROIN DEBRIDEMENT Right 04/07/2020   Procedure: INCISION AND DRAINAGE OF RIGHT GROIN;  Surgeon: [name], MD;  [facility];  Service: Vascular;  Laterality: Right;   I & D EXTREMITY Left 09/03/2017   Procedure: IRRIGATION AND DEBRIDEMENT EXTREMITY LEFT THIGH SEROMA;  Surgeon: Conrad Tamaroa, MD;  Location: [place];  Service: Vascular;  Laterality: Left;   INSERT / REPLACE / [place]  03/31/2020   Procedure: INSERTION OF RIGHT ILIAC ARTERY STENT AND RIGHT SUPERFICIAL FEMORAL ARTERY STENT;  Surgeon: [name], MD;  [facility];  Service: Vascular;;   IR ANGIO INTRA EXTRACRAN SEL COM CAROTID INNOMINATE BILAT MOD SED  07/16/2017   IR ANGIO INTRA EXTRACRAN SEL COM CAROTID INNOMINATE BILAT MOD SED  06/12/2019   IR ANGIO INTRA EXTRACRAN SEL COM CAROTID INNOMINATE BILAT MOD SED  01/17/2021   IR ANGIO VERTEBRAL SEL SUBCLAVIAN INNOMINATE BILAT MOD SED  07/16/2017   IR ANGIO VERTEBRAL SEL SUBCLAVIAN INNOMINATE BILAT MOD SED  06/12/2019   IR ANGIO VERTEBRAL SEL SUBCLAVIAN INNOMINATE BILAT MOD SED  01/17/2021  IR RADIOLOGIST EVAL & MGMT  04/01/2017   IR RADIOLOGIST EVAL & MGMT  08/02/2017   IR TRANSCATH EXCRAN VERT OR CAR A STENT  07/22/2019   IR US GUIDE VASC ACCESS RIGHT  06/12/2019   IR US GUIDE VASC ACCESS RIGHT  01/17/2021   JOINT REPLACEMENT Right    Total Shoulder Replacement    LOWER EXTREMITY ANGIOGRAPHY Bilateral 06/25/2017   Procedure: Lower Extremity Angiography;  Surgeon: [name], MD;  Location: [place] CV LAB;  Service: Cardiovascular;  Laterality: Bilateral;   LUMBAR FUSION  2010   PATCH ANGIOPLASTY Right 03/31/2020   Procedure: PATCH ANGIOPLASTY USING XENOSURE BIOLOGIC PATCH 1cm x 14cm;  Surgeon: [name], MD;  Location: [facility] OR;  Service: Vascular;  Laterality: Right;   PERIPHERAL VASCULAR ATHERECTOMY Right  04/22/2018   Procedure: PERIPHERAL VASCULAR ATHERECTOMY;  Surgeon: [name], MD;  Location: [place] CV LAB;  Service: Cardiovascular;  Laterality: Right;  right superficial femoral   PERIPHERAL VASCULAR BALLOON ANGIOPLASTY Right 04/22/2018   Procedure: PERIPHERAL VASCULAR BALLOON ANGIOPLASTY;  Surgeon: [name], MD;  Location: [place] CV LAB;  Service: Cardiovascular;  Laterality: Right;  external iliac   PERIPHERAL VASCULAR BALLOON ANGIOPLASTY Left 03/08/2020   Procedure: PERIPHERAL VASCULAR BALLOON ANGIOPLASTY;  Surgeon: [name], MD;  Location: [place] CV LAB;  Service: Cardiovascular;  Laterality: Left;  Common Femoral   PERIPHERAL VASCULAR BALLOON ANGIOPLASTY Left 08/23/2020   Procedure: PERIPHERAL VASCULAR BALLOON ANGIOPLASTY;  Surgeon: [name], MD;  Location: [place] CV LAB;  Service: Cardiovascular;  Laterality: Left;  common femoral   PERIPHERAL VASCULAR BALLOON ANGIOPLASTY Left 10/31/2021   Procedure: PERIPHERAL VASCULAR BALLOON ANGIOPLASTY;  Surgeon: [name], MD;  Location: [place] CV LAB;  Service: Cardiovascular;  Laterality: Left;  external iliac   PERIPHERAL VASCULAR INTERVENTION Right 03/08/2020   Procedure: PERIPHERAL VASCULAR INTERVENTION;  Surgeon: [name], MD;  Location: [place] CV LAB;  Service: Cardiovascular;  Laterality: Right;  SFA   PERIPHERAL VASCULAR INTERVENTION Right 08/23/2020   Procedure: PERIPHERAL VASCULAR INTERVENTION;  Surgeon: [name], MD;  Location: [place] CV LAB;  Service: Cardiovascular;  Laterality: Right;  external iliac   PERIPHERAL VASCULAR INTERVENTION Right 10/31/2021   Procedure: PERIPHERAL VASCULAR INTERVENTION;  Surgeon: [name], MD;  Location: [place] CV LAB;  Service: Cardiovascular;  Laterality: Right;  superficial femoral artery   RADIOLOGY WITH ANESTHESIA [name] 07/01/2019   Procedure: STENTING;  Surgeon: Luanne Bras, MD;  Location: [place];  Service:  Radiology;  Laterality: [name];   RADIOLOGY WITH ANESTHESIA [name] 07/22/2019   Procedure: STENTING;  Surgeon: Luanne Bras, MD;  Location: [place];  Service: Radiology;  Laterality: [name];   TOTAL SHOULDER ARTHROPLASTY Right 2011   Dr. Rafael Bihari NERVE TRANSPOSITION Left 02/02/2016   Procedure: LEFT IN-SITU DECOMPRESSION ULNAR NERVE ;  Surgeon: Daryll Brod, MD;  Location: [place];  Service: Orthopedics;  Laterality: Left;   ULNAR TUNNEL RELEASE Left 02/02/2016   Procedure: LEFT CUBITAL TUNNEL RELEASE;  Surgeon: Daryll Brod, MD;  Location: [place];  Service: Orthopedics;  Laterality: Left;   VASECTOMY  1971   VEIN HARVEST Left 08/16/2017   Procedure: USING NON REVERSE LEFT GREATER SAPHENOUS VEIN HARVEST;  Surgeon: [name], MD;  [facility];  Service: Vascular;  Laterality: Left;    Social History   Socioeconomic History   Marital status: Married    Spouse name: Not on  file   Number of children: 2   Years of education: Not on file   Highest education level: Not on file  Occupational History   Occupation: retired, Clinical biochemist    Comment: Programmer, systems: RETIRED  Tobacco Use   Smoking status: Every Day    Packs/day: 1.00    Years: 60.00    Pack years: 60.00    Types: Cigarettes    Start date: 08/20/1957   Smokeless tobacco: Never  Vaping Use   Vaping Use: Former   Start date: 09/10/2016   Quit date: 10/11/2016  Substance and Sexual Activity   Alcohol use: No    Alcohol/week: 0.0 standard drinks    Comment: quit in July 2015   Drug use: No   Sexual activity: Not Currently  Other Topics Concern   Not on file  Social History Narrative   Accompanied by daughter [name]   Lives w/ wife, daughter   Social Determinants of Health   Financial Resource Strain: Not on file  Food Insecurity: Not on file  Transportation Needs: Not on file  Physical Activity: Not on file  Stress: Not on file  Social Connections: Not on  file  Intimate Partner Violence: Not on file    Family History  Problem Relation Age of Onset   Coronary artery disease Mother    Diabetes Mother    Heart disease Mother        Before age 14 - 14 Bypasses   Hypertension Mother    Heart attack Mother        3-4 Heart attacks   Alzheimer's disease Father    Diabetes Father    Heart attack Sister    Cancer Brother        Prostate   Hyperlipidemia Son    Prostate cancer Brother    Alzheimer's disease Sister    Colon cancer Neg Hx     Current Outpatient Medications  Medication Sig Dispense Refill   acetaminophen (TYLENOL) 500 MG tablet Take 1,000 mg by mouth every 8 (eight) hours as needed for moderate pain or headache.     aspirin EC 81 MG tablet Take 81 mg by mouth every evening.      atorvastatin (LIPITOR) 20 MG tablet Take 20 mg by mouth every evening.     BRILINTA 90 MG TABS tablet Take 90 mg by mouth 2 (two) times daily.     carvedilol (COREG) 3.125 MG tablet TAKE 1 TABLET TWICE A DAY 180 tablet 0   dextromethorphan-guaiFENesin (MUCINEX DM) 30-600 MG 12hr tablet Take 1 tablet by mouth daily.      gabapentin (NEURONTIN) 300 MG capsule Take 300 mg by mouth 2 (two) times daily.      magnesium oxide (MAG-OX) 400 MG tablet Take 400 mg by mouth every evening.     Multiple Vitamins-Minerals (CENTRUM SILVER PO) Take 1 tablet by mouth daily. 50+     naphazoline-pheniramine (VISINE) 0.025-0.3 % ophthalmic solution 1 drop daily as needed for eye irritation (itching).     Omega-3 Fatty Acids (FISH OIL) 1000 MG CAPS Take 1,000 mg by mouth 2 (two) times daily.     oxyCODONE-acetaminophen (PERCOCET) 10-325 MG tablet Take 1 tablet by mouth daily as needed for moderate pain.      sacubitril-valsartan (ENTRESTO) 97-103 MG Take 1 tablet by mouth 2 (two) times daily. 180 tablet 3   SUPER B COMPLEX/C PO Take 1 tablet by mouth at bedtime.      tamsulosin (FLOMAX) 0.4  MG CAPS capsule Take 0.4 mg by mouth 2 (two) times daily.      traMADol (ULTRAM)  50 MG tablet Take 50 mg by mouth daily as needed for moderate pain.      No current facility-administered medications for this visit.    No Known Allergies   REVIEW OF SYSTEMS:   [X]  denotes positive finding, [ ]  denotes negative finding Cardiac  Comments:  Chest pain or chest pressure:    Shortness of breath upon exertion:    Short of breath when lying flat:    Irregular heart rhythm:        Vascular    Pain in calf, thigh, or hip brought on by ambulation:    Pain in feet at night that wakes you up from your sleep:     Blood clot in your veins:    Leg swelling:         Pulmonary    Oxygen at home:    Productive cough:     Wheezing:         Neurologic    Sudden weakness in arms or legs:     Sudden numbness in arms or legs:     Sudden onset of difficulty speaking or slurred speech:    Temporary loss of vision in one eye:     Problems with dizziness:         Gastrointestinal    Blood in stool:     Vomited blood:         Genitourinary    Burning when urinating:     Blood in urine:        Psychiatric    Major depression:         Hematologic    Bleeding problems:    Problems with blood clotting too easily:        Skin    Rashes or ulcers:        Constitutional    Fever or chills:      PHYSICAL EXAMINATION:  Vitals:   12/11/21 0957  BP: (!) 148/74  Pulse: 62  Resp: 20  Temp: (!) 97.5 F (36.4 C)  TempSrc: Temporal  SpO2: 100%  Weight: 148 lb 3.2 oz (67.2 kg)  Height: 5\' 3"  (1.6 m)    General:  WDWN in NAD; vital signs documented above Gait: Not observed HENT: WNL, normocephalic Pulmonary: normal non-labored breathing , without Rales, rhonchi,  wheezing Cardiac: regular HR Abdomen: soft, NT, no masses Skin: without rashes Extremities: Symmetrical femoral pulses; no tissue loss bilateral lower extremities Musculoskeletal: no muscle wasting or atrophy  Neurologic: A&O X 3;  No focal weakness or paresthesias are detected Psychiatric:  The pt has  Normal affect.   Non-Invasive Vascular Imaging:   Left external iliac artery with greater than 550 cm/s Left femoral to popliteal bypass widely patent  Right common femoral artery with 50 to 71% stenosis Right SFA stent widely patent  ABI/TBI Today's ABI Today's TBI Previous ABI Previous TBI   +-------+-----------+-----------+------------+------------+   Right   1.01        0.85        0.86         0.73           +-------+-----------+-----------+------------+------------+   Left    0.86        0.72        0.74         0.52           +-------+-----------+-----------+------------+------------+  ASSESSMENT/PLAN:: 76 y.o. male here status post left external iliac artery balloon angioplasty as well as right SFA stenting via left common femoral artery access 6 weeks ago by Dr. [name]  -Subjectively, patient denies any pain from his left groin access site and also denies any significant claudication, rest pain, or non healing wounds of bilateral lower extremities -Despite balloon angioplasty of left external iliac artery he continues to have an elevated velocity greater than 550 cm/s which is threatening to his left lower extremity femoral to popliteal bypass graft -Images were reviewed with Dr. [name] who will proceed with repeat aortogram with left external iliac intervention via right common femoral artery access.  This will be scheduled next week on 12/19/2021.  This was discussed with the patient who agrees to proceed.  He can continue his aspirin and Brilinta perioperatively  Dagoberto Ligas, PA-C [facility] [phone]  Clinic MD:   [name]

## 2021-12-11 NOTE — H&P (View-Only) (Signed)
Office Note     CC:  follow up Requesting Provider:  Celene Squibb, MD  HPI: Roy [name] is a 76 y.o. (01-20-1946) male who presents status post aortogram with angioplasty of left external iliac artery and stenting of right SFA by Dr. [name] on 10/31/2021.  He returns today for postoperative surveillance with noninvasive imaging.  He denies any claudication symptoms.  He also denies any rest pain or tissue loss of bilateral lower extremities.  He denies any pain or trouble from his left groin catheterization site.  He continues to smoke daily with no interest in quitting.  He is on aspirin, Brilinta, and statin daily.   Past Medical History:  Diagnosis Date   AICD (automatic cardioverter/defibrillator) present 08/19/2015   St Jude BiV ICD for primary prevention by Dr. Lovena Le   Alcohol abuse    6 beers per day; hospital admission in 2009 for withdrawal symptoms   Alcoholic cirrhosis ([place])    Anxiety and depression    denies    Cerebrovascular disease 2009   TIA; 2009- right ICA stent; re-intervention for restenosis complicated by [facility] w/o sx   CHF (congestive heart failure) ([place]) 06-02-14   Chronic obstructive pulmonary disease (HCC)    Degenerative joint disease    Total shoulder arthroplasty-right   Hyperlipidemia    Hypertension    LBBB (left bundle branch block)    Normal echo-2011; stress nuclear in 09/2010--septal hypoperfusion representing nontransmural infarction or the effect of left bundle branch block, no ischemia   Myocardial infarction [facility]) June 02, 2014   Massive Heart Attack   Peripheral vascular disease ([place])    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   Thrombocytopenia (HCC)    Tobacco abuse    -100 pack years; 1.5 packs per day   Traumatic seroma of thigh (HCC)    left   Tubular adenoma of colon     Past Surgical History:  Procedure Laterality Date   ABDOMINAL AORTAGRAM [name] 05/04/2014   Procedure: ABDOMINAL Maxcine Ham;  Surgeon: [name], MD;   Location: [facility] CATH LAB;  Service: Cardiovascular;  Laterality: [name];   ABDOMINAL AORTOGRAM [name] 06/25/2017   Procedure: Abdominal Aortogram;  Surgeon: [name], MD;  Location: [place] CV LAB;  Service: Cardiovascular;  Laterality: [name];   ABDOMINAL AORTOGRAM W/LOWER EXTREMITY [name] 04/22/2018   Procedure: ABDOMINAL AORTOGRAM W/LOWER EXTREMITY;  Surgeon: [name], MD;  Location: [place] CV LAB;  Service: Cardiovascular;  Laterality: [name];   ABDOMINAL AORTOGRAM W/LOWER EXTREMITY [name] 03/08/2020   Procedure: ABDOMINAL AORTOGRAM W/LOWER EXTREMITY;  Surgeon: [name], MD;  Location: [place] CV LAB;  Service: Cardiovascular;  Laterality: [name];   ABDOMINAL AORTOGRAM W/LOWER EXTREMITY Bilateral 08/23/2020   Procedure: ABDOMINAL AORTOGRAM W/LOWER EXTREMITY;  Surgeon: [name], MD;  Location: [place] CV LAB;  Service: Cardiovascular;  Laterality: Bilateral;   ABDOMINAL AORTOGRAM W/LOWER EXTREMITY [name] 10/31/2021   Procedure: ABDOMINAL AORTOGRAM W/LOWER EXTREMITY;  Surgeon: [name], MD;  Location: [place] CV LAB;  Service: Cardiovascular;  Laterality: [name];   AGILE CAPSULE [name] 03/18/2014   Procedure: AGILE CAPSULE;  Surgeon: Danie Binder, MD;  [facility];  Service: Endoscopy;  Laterality: [name];  8:52   APPLICATION OF WOUND VAC Left 09/03/2017   Procedure: APPLICATION OF WOUND VAC;  Surgeon: Conrad Markham, MD;  Location: [place];  Service: Vascular;  Laterality: Left;   APPLICATION OF WOUND VAC Right 04/07/2020   Procedure: PLACEMENT OF ANTIBIOTIC BEADS  AND APPLICATION OF WOUND VAC;  Surgeon: [name], MD;  [facility];  Service: Vascular;  Laterality: Right;   BACK SURGERY     BACTERIAL OVERGROWTH TEST [name] 05/24/2015   Procedure: BACTERIAL OVERGROWTH TEST;  Surgeon: Danie Binder, MD;  [facility];  Service: Endoscopy;  Laterality: [name];  0700   BI-VENTRICULAR IMPLANTABLE CARDIOVERTER DEFIBRILLATOR  (CRT-D)  08/19/2015   CARPAL  TUNNEL RELEASE Left 02/02/2016   Procedure: LEFT CARPAL TUNNEL RELEASE;  Surgeon: Daryll Brod, MD;  Location: [place];  Service: Orthopedics;  Laterality: Left;  ANESTHESIA: IV REGIONAL UPPER ARM   CATARACT EXTRACTION W/PHACO Right 03/08/2015   Procedure: CATARACT EXTRACTION PHACO AND INTRAOCULAR LENS PLACEMENT (IOC);  Surgeon: Rutherford Guys, MD;  Location: [facility] OR[facility];  Service: Ophthalmology;  Laterality: Right;  CDE:9.46   CATARACT EXTRACTION W/PHACO Left 03/22/2015   Procedure: CATARACT EXTRACTION PHACO AND INTRAOCULAR LENS PLACEMENT (IOC);  Surgeon: Rutherford Guys, MD;  [facility];  Service: Ophthalmology;  Laterality: Left;  CDE:5.80   COLONOSCOPY  08/22/09   Fields-(Tubular Adenoma)3-mm transverse polyp/4-mm polyp otherwise noraml/small internal hemorrhoids   COLONOSCOPY [name] 04/14/2014   MCN:OBSJG internal hemorrhids/normal mocsa in the terminal iluem/left colonis redundant   COLONOSCOPY W/ POLYPECTOMY  2011   ENDARTERECTOMY FEMORAL Left 08/16/2017   Procedure: ENDARTERECTOMY LEFT PROFUNDA FEMORAL;  Surgeon: [name], MD;  Location: [facility] OR;  Service: Vascular;  Laterality: Left;   ENDARTERECTOMY FEMORAL Right 03/31/2020   Procedure: RIGHT FEMORAL ENDARTERECTOMY;  Surgeon: [name], MD;  Location: [place];  Service: Vascular;  Laterality: Right;   EP IMPLANTABLE DEVICE [name] 08/19/2015   Procedure: BiV ICD Insertion CRT-D;  Surgeon: Evans Lance, MD;  Location: [place] CV LAB;  Service: Cardiovascular;  Laterality: [name];   ESOPHAGOGASTRODUODENOSCOPY [name] 03/05/2014   SLF: 1. Stricture at the gastroesophagael junction 2. Small hiatal hernia 3. Moderate non-erosive gastritis and duodentitis. 4. No surce for Melena identified.    FEMORAL-POPLITEAL BYPASS GRAFT Left 08/16/2017   Procedure: LEFT  FEMORAL-POPLITEAL ARTERY  BYPASS GRAFT;  Surgeon: [name], MD;  Location: [place];  Service: Vascular;  Laterality: Left;   GIVENS CAPSULE STUDY [name] 03/30/2014   Procedure:  GIVENS CAPSULE STUDY;  Surgeon: Danie Binder, MD;  [facility];  Service: Endoscopy;  Laterality: [name];  7:30   GROIN DEBRIDEMENT Right 04/07/2020   Procedure: INCISION AND DRAINAGE OF RIGHT GROIN;  Surgeon: [name], MD;  [facility];  Service: Vascular;  Laterality: Right;   I & D EXTREMITY Left 09/03/2017   Procedure: IRRIGATION AND DEBRIDEMENT EXTREMITY LEFT THIGH SEROMA;  Surgeon: Conrad Harrod, MD;  Location: [place];  Service: Vascular;  Laterality: Left;   INSERT / REPLACE / [place]  03/31/2020   Procedure: INSERTION OF RIGHT ILIAC ARTERY STENT AND RIGHT SUPERFICIAL FEMORAL ARTERY STENT;  Surgeon: [name], MD;  [facility];  Service: Vascular;;   IR ANGIO INTRA EXTRACRAN SEL COM CAROTID INNOMINATE BILAT MOD SED  07/16/2017   IR ANGIO INTRA EXTRACRAN SEL COM CAROTID INNOMINATE BILAT MOD SED  06/12/2019   IR ANGIO INTRA EXTRACRAN SEL COM CAROTID INNOMINATE BILAT MOD SED  01/17/2021   IR ANGIO VERTEBRAL SEL SUBCLAVIAN INNOMINATE BILAT MOD SED  07/16/2017   IR ANGIO VERTEBRAL SEL SUBCLAVIAN INNOMINATE BILAT MOD SED  06/12/2019   IR ANGIO VERTEBRAL SEL SUBCLAVIAN INNOMINATE BILAT MOD SED  01/17/2021  IR RADIOLOGIST EVAL & MGMT  04/01/2017   IR RADIOLOGIST EVAL & MGMT  08/02/2017   IR TRANSCATH EXCRAN VERT OR CAR A STENT  07/22/2019   IR US GUIDE VASC ACCESS RIGHT  06/12/2019   IR US GUIDE VASC ACCESS RIGHT  01/17/2021   JOINT REPLACEMENT Right    Total Shoulder Replacement    LOWER EXTREMITY ANGIOGRAPHY Bilateral 06/25/2017   Procedure: Lower Extremity Angiography;  Surgeon: [name], MD;  Location: [place] CV LAB;  Service: Cardiovascular;  Laterality: Bilateral;   LUMBAR FUSION  2010   PATCH ANGIOPLASTY Right 03/31/2020   Procedure: PATCH ANGIOPLASTY USING XENOSURE BIOLOGIC PATCH 1cm x 14cm;  Surgeon: [name], MD;  Location: [facility] OR;  Service: Vascular;  Laterality: Right;   PERIPHERAL VASCULAR ATHERECTOMY Right  04/22/2018   Procedure: PERIPHERAL VASCULAR ATHERECTOMY;  Surgeon: [name], MD;  Location: [place] CV LAB;  Service: Cardiovascular;  Laterality: Right;  right superficial femoral   PERIPHERAL VASCULAR BALLOON ANGIOPLASTY Right 04/22/2018   Procedure: PERIPHERAL VASCULAR BALLOON ANGIOPLASTY;  Surgeon: [name], MD;  Location: [place] CV LAB;  Service: Cardiovascular;  Laterality: Right;  external iliac   PERIPHERAL VASCULAR BALLOON ANGIOPLASTY Left 03/08/2020   Procedure: PERIPHERAL VASCULAR BALLOON ANGIOPLASTY;  Surgeon: [name], MD;  Location: [place] CV LAB;  Service: Cardiovascular;  Laterality: Left;  Common Femoral   PERIPHERAL VASCULAR BALLOON ANGIOPLASTY Left 08/23/2020   Procedure: PERIPHERAL VASCULAR BALLOON ANGIOPLASTY;  Surgeon: [name], MD;  Location: [place] CV LAB;  Service: Cardiovascular;  Laterality: Left;  common femoral   PERIPHERAL VASCULAR BALLOON ANGIOPLASTY Left 10/31/2021   Procedure: PERIPHERAL VASCULAR BALLOON ANGIOPLASTY;  Surgeon: [name], MD;  Location: [place] CV LAB;  Service: Cardiovascular;  Laterality: Left;  external iliac   PERIPHERAL VASCULAR INTERVENTION Right 03/08/2020   Procedure: PERIPHERAL VASCULAR INTERVENTION;  Surgeon: [name], MD;  Location: [place] CV LAB;  Service: Cardiovascular;  Laterality: Right;  SFA   PERIPHERAL VASCULAR INTERVENTION Right 08/23/2020   Procedure: PERIPHERAL VASCULAR INTERVENTION;  Surgeon: [name], MD;  Location: [place] CV LAB;  Service: Cardiovascular;  Laterality: Right;  external iliac   PERIPHERAL VASCULAR INTERVENTION Right 10/31/2021   Procedure: PERIPHERAL VASCULAR INTERVENTION;  Surgeon: [name], MD;  Location: [place] CV LAB;  Service: Cardiovascular;  Laterality: Right;  superficial femoral artery   RADIOLOGY WITH ANESTHESIA [name] 07/01/2019   Procedure: STENTING;  Surgeon: Luanne Bras, MD;  Location: [place];  Service:  Radiology;  Laterality: [name];   RADIOLOGY WITH ANESTHESIA [name] 07/22/2019   Procedure: STENTING;  Surgeon: Luanne Bras, MD;  Location: [place];  Service: Radiology;  Laterality: [name];   TOTAL SHOULDER ARTHROPLASTY Right 2011   Dr. Rafael Bihari NERVE TRANSPOSITION Left 02/02/2016   Procedure: LEFT IN-SITU DECOMPRESSION ULNAR NERVE ;  Surgeon: Daryll Brod, MD;  Location: [place];  Service: Orthopedics;  Laterality: Left;   ULNAR TUNNEL RELEASE Left 02/02/2016   Procedure: LEFT CUBITAL TUNNEL RELEASE;  Surgeon: Daryll Brod, MD;  Location: [place];  Service: Orthopedics;  Laterality: Left;   VASECTOMY  1971   VEIN HARVEST Left 08/16/2017   Procedure: USING NON REVERSE LEFT GREATER SAPHENOUS VEIN HARVEST;  Surgeon: [name], MD;  [facility];  Service: Vascular;  Laterality: Left;    Social History   Socioeconomic History   Marital status: Married    Spouse name: Not on  file   Number of children: 2   Years of education: Not on file   Highest education level: Not on file  Occupational History   Occupation: retired, Clinical biochemist    Comment: Programmer, systems: RETIRED  Tobacco Use   Smoking status: Every Day    Packs/day: 1.00    Years: 60.00    Pack years: 60.00    Types: Cigarettes    Start date: 08/20/1957   Smokeless tobacco: Never  Vaping Use   Vaping Use: Former   Start date: 09/10/2016   Quit date: 10/11/2016  Substance and Sexual Activity   Alcohol use: No    Alcohol/week: 0.0 standard drinks    Comment: quit in July 2015   Drug use: No   Sexual activity: Not Currently  Other Topics Concern   Not on file  Social History Narrative   Accompanied by daughter [name]   Lives w/ wife, daughter   Social Determinants of Health   Financial Resource Strain: Not on file  Food Insecurity: Not on file  Transportation Needs: Not on file  Physical Activity: Not on file  Stress: Not on file  Social Connections: Not on  file  Intimate Partner Violence: Not on file    Family History  Problem Relation Age of Onset   Coronary artery disease Mother    Diabetes Mother    Heart disease Mother        Before age 5 - 66 Bypasses   Hypertension Mother    Heart attack Mother        3-4 Heart attacks   Alzheimer's disease Father    Diabetes Father    Heart attack Sister    Cancer Brother        Prostate   Hyperlipidemia Son    Prostate cancer Brother    Alzheimer's disease Sister    Colon cancer Neg Hx     Current Outpatient Medications  Medication Sig Dispense Refill   acetaminophen (TYLENOL) 500 MG tablet Take 1,000 mg by mouth every 8 (eight) hours as needed for moderate pain or headache.     aspirin EC 81 MG tablet Take 81 mg by mouth every evening.      atorvastatin (LIPITOR) 20 MG tablet Take 20 mg by mouth every evening.     BRILINTA 90 MG TABS tablet Take 90 mg by mouth 2 (two) times daily.     carvedilol (COREG) 3.125 MG tablet TAKE 1 TABLET TWICE A DAY 180 tablet 0   dextromethorphan-guaiFENesin (MUCINEX DM) 30-600 MG 12hr tablet Take 1 tablet by mouth daily.      gabapentin (NEURONTIN) 300 MG capsule Take 300 mg by mouth 2 (two) times daily.      magnesium oxide (MAG-OX) 400 MG tablet Take 400 mg by mouth every evening.     Multiple Vitamins-Minerals (CENTRUM SILVER PO) Take 1 tablet by mouth daily. 50+     naphazoline-pheniramine (VISINE) 0.025-0.3 % ophthalmic solution 1 drop daily as needed for eye irritation (itching).     Omega-3 Fatty Acids (FISH OIL) 1000 MG CAPS Take 1,000 mg by mouth 2 (two) times daily.     oxyCODONE-acetaminophen (PERCOCET) 10-325 MG tablet Take 1 tablet by mouth daily as needed for moderate pain.      sacubitril-valsartan (ENTRESTO) 97-103 MG Take 1 tablet by mouth 2 (two) times daily. 180 tablet 3   SUPER B COMPLEX/C PO Take 1 tablet by mouth at bedtime.      tamsulosin (FLOMAX) 0.4  MG CAPS capsule Take 0.4 mg by mouth 2 (two) times daily.      traMADol (ULTRAM)  50 MG tablet Take 50 mg by mouth daily as needed for moderate pain.      No current facility-administered medications for this visit.    No Known Allergies   REVIEW OF SYSTEMS:   [X]  denotes positive finding, [ ]  denotes negative finding Cardiac  Comments:  Chest pain or chest pressure:    Shortness of breath upon exertion:    Short of breath when lying flat:    Irregular heart rhythm:        Vascular    Pain in calf, thigh, or hip brought on by ambulation:    Pain in feet at night that wakes you up from your sleep:     Blood clot in your veins:    Leg swelling:         Pulmonary    Oxygen at home:    Productive cough:     Wheezing:         Neurologic    Sudden weakness in arms or legs:     Sudden numbness in arms or legs:     Sudden onset of difficulty speaking or slurred speech:    Temporary loss of vision in one eye:     Problems with dizziness:         Gastrointestinal    Blood in stool:     Vomited blood:         Genitourinary    Burning when urinating:     Blood in urine:        Psychiatric    Major depression:         Hematologic    Bleeding problems:    Problems with blood clotting too easily:        Skin    Rashes or ulcers:        Constitutional    Fever or chills:      PHYSICAL EXAMINATION:  Vitals:   12/11/21 0957  BP: (!) 148/74  Pulse: 62  Resp: 20  Temp: (!) 97.5 F (36.4 C)  TempSrc: Temporal  SpO2: 100%  Weight: 148 lb 3.2 oz (67.2 kg)  Height: 5\' 3"  (1.6 m)    General:  WDWN in NAD; vital signs documented above Gait: Not observed HENT: WNL, normocephalic Pulmonary: normal non-labored breathing , without Rales, rhonchi,  wheezing Cardiac: regular HR Abdomen: soft, NT, no masses Skin: without rashes Extremities: Symmetrical femoral pulses; no tissue loss bilateral lower extremities Musculoskeletal: no muscle wasting or atrophy  Neurologic: A&O X 3;  No focal weakness or paresthesias are detected Psychiatric:  The pt has  Normal affect.   Non-Invasive Vascular Imaging:   Left external iliac artery with greater than 550 cm/s Left femoral to popliteal bypass widely patent  Right common femoral artery with 50 to 71% stenosis Right SFA stent widely patent  ABI/TBI Today's ABI Today's TBI Previous ABI Previous TBI   +-------+-----------+-----------+------------+------------+   Right   1.01        0.85        0.86         0.73           +-------+-----------+-----------+------------+------------+   Left    0.86        0.72        0.74         0.52           +-------+-----------+-----------+------------+------------+  ASSESSMENT/PLAN:: 76 y.o. male here status post left external iliac artery balloon angioplasty as well as right SFA stenting via left common femoral artery access 6 weeks ago by Dr. [name]  -Subjectively, patient denies any pain from his left groin access site and also denies any significant claudication, rest pain, or non healing wounds of bilateral lower extremities -Despite balloon angioplasty of left external iliac artery he continues to have an elevated velocity greater than 550 cm/s which is threatening to his left lower extremity femoral to popliteal bypass graft -Images were reviewed with Dr. [name] who will proceed with repeat aortogram with left external iliac intervention via right common femoral artery access.  This will be scheduled next week on 12/19/2021.  This was discussed with the patient who agrees to proceed.  He can continue his aspirin and Brilinta perioperatively  Dagoberto Ligas, PA-C [facility] [phone]  Clinic MD:   [name]

## 2021-12-12 ENCOUNTER — Encounter (HOSPITAL_COMMUNITY): Payer: Medicare Other

## 2021-12-19 ENCOUNTER — Ambulatory Visit (HOSPITAL_COMMUNITY)
Admission: RE | Admit: 2021-12-19 | Discharge: 2021-12-19 | Disposition: A | Discharge status: Home / Self Care | Payer: Medicare Other | Attending: Surgery | Admitting: Surgery

## 2021-12-19 ENCOUNTER — Encounter (HOSPITAL_COMMUNITY)
Admission: RE | Disposition: A | Discharge status: Home / Self Care | Payer: Self-pay | Source: Home / Self Care | Attending: Surgery

## 2021-12-19 ENCOUNTER — Other Ambulatory Visit: Payer: Self-pay

## 2021-12-19 DIAGNOSIS — Z7982 Long term (current) use of aspirin: Secondary | ICD-10-CM | POA: Diagnosis not present

## 2021-12-19 DIAGNOSIS — Z79899 Other long term (current) drug therapy: Secondary | ICD-10-CM | POA: Diagnosis not present

## 2021-12-19 DIAGNOSIS — Z7902 Long term (current) use of antithrombotics/antiplatelets: Secondary | ICD-10-CM | POA: Diagnosis not present

## 2021-12-19 DIAGNOSIS — I708 Atherosclerosis of other arteries: Secondary | ICD-10-CM | POA: Diagnosis not present

## 2021-12-19 DIAGNOSIS — I70212 Atherosclerosis of native arteries of extremities with intermittent claudication, left leg: Secondary | ICD-10-CM | POA: Diagnosis not present

## 2021-12-19 DIAGNOSIS — F17210 Nicotine dependence, cigarettes, uncomplicated: Secondary | ICD-10-CM | POA: Insufficient documentation

## 2021-12-19 HISTORY — PX: ABDOMINAL AORTOGRAM W/LOWER EXTREMITY: CATH118223

## 2021-12-19 HISTORY — PX: PERIPHERAL VASCULAR INTERVENTION: CATH118257

## 2021-12-19 LAB — POCT I-STAT, CHEM 8
BUN: 15 mg/dL (ref 8–23)
BUN: 20 mg/dL (ref 8–23)
Calcium, Ion: 1.15 mmol/L (ref 1.15–1.40)
Calcium, Ion: 1.22 mmol/L (ref 1.15–1.40)
Chloride: 101 mmol/L (ref 98–111)
Chloride: 103 mmol/L (ref 98–111)
Creatinine, Ser: 0.7 mg/dL (ref 0.61–1.24)
Creatinine, Ser: 0.7 mg/dL (ref 0.61–1.24)
Glucose, Bld: 104 mg/dL — ABNORMAL HIGH (ref 70–99)
Glucose, Bld: 108 mg/dL — ABNORMAL HIGH (ref 70–99)
HCT: 44 % (ref 39.0–52.0)
HCT: 46 % (ref 39.0–52.0)
Hemoglobin: 15 g/dL (ref 13.0–17.0)
Hemoglobin: 15.6 g/dL (ref 13.0–17.0)
Potassium: 4.4 mmol/L (ref 3.5–5.1)
Potassium: 5.7 mmol/L — ABNORMAL HIGH (ref 3.5–5.1)
Sodium: 136 mmol/L (ref 135–145)
Sodium: 138 mmol/L (ref 135–145)
TCO2: 27 mmol/L (ref 22–32)
TCO2: 29 mmol/L (ref 22–32)

## 2021-12-19 LAB — POCT ACTIVATED CLOTTING TIME
Activated Clotting Time: 263 seconds
Activated Clotting Time: 299 seconds
Activated Clotting Time: 197 seconds
Activated Clotting Time: 173 seconds

## 2021-12-19 SURGERY — ABDOMINAL AORTOGRAM W/LOWER EXTREMITY
Anesthesia: LOCAL

## 2021-12-19 MED ORDER — FENTANYL CITRATE (PF) 100 MCG/2ML IJ SOLN
INTRAMUSCULAR | Status: AC
  Filled 2021-12-19: qty 2

## 2021-12-19 MED ORDER — LIDOCAINE HCL (PF) 1 % IJ SOLN
INTRAMUSCULAR | Status: AC
  Filled 2021-12-19: qty 30

## 2021-12-19 MED ORDER — HYDRALAZINE HCL 20 MG/ML IJ SOLN: 5.0000 mg | INTRAMUSCULAR | Status: DC | PRN

## 2021-12-19 MED ORDER — SODIUM CHLORIDE 0.9 % WEIGHT BASED INFUSION: 1.0000 mL/kg/h | INTRAVENOUS | Status: DC

## 2021-12-19 MED ORDER — HEPARIN (PORCINE) IN NACL 1000-0.9 UT/500ML-% IV SOLN
INTRAVENOUS | Status: AC
  Filled 2021-12-19: qty 500

## 2021-12-19 MED ORDER — MIDAZOLAM HCL 2 MG/2ML IJ SOLN
INTRAMUSCULAR | Status: DC | PRN
  Administered 2021-12-19: 2 mg via INTRAVENOUS

## 2021-12-19 MED ORDER — FENTANYL CITRATE (PF) 100 MCG/2ML IJ SOLN
INTRAMUSCULAR | Status: DC | PRN
  Administered 2021-12-19: 50 ug via INTRAVENOUS

## 2021-12-19 MED ORDER — IODIXANOL 320 MG/ML IV SOLN
INTRAVENOUS | Status: DC | PRN
  Administered 2021-12-19: 10:00:00 45 mL

## 2021-12-19 MED ORDER — OXYCODONE HCL 5 MG PO TABS
5.0000 mg | ORAL_TABLET | ORAL | Status: DC | PRN
  Administered 2021-12-19: 11:00:00 10 mg via ORAL

## 2021-12-19 MED ORDER — MORPHINE SULFATE (PF) 2 MG/ML IV SOLN: 2.0000 mg | INTRAVENOUS | Status: DC | PRN

## 2021-12-19 MED ORDER — ONDANSETRON HCL 4 MG/2ML IJ SOLN: 4.0000 mg | Freq: Four times a day (QID) | INTRAMUSCULAR | Status: DC | PRN

## 2021-12-19 MED ORDER — HEPARIN (PORCINE) IN NACL 1000-0.9 UT/500ML-% IV SOLN
INTRAVENOUS | Status: DC | PRN
  Administered 2021-12-19 (×2): 500 mL

## 2021-12-19 MED ORDER — SODIUM CHLORIDE 0.9% FLUSH: 3.0000 mL | INTRAVENOUS | Status: DC | PRN

## 2021-12-19 MED ORDER — SODIUM CHLORIDE 0.9% FLUSH: 3.0000 mL | Freq: Two times a day (BID) | INTRAVENOUS | Status: DC

## 2021-12-19 MED ORDER — MIDAZOLAM HCL 2 MG/2ML IJ SOLN
INTRAMUSCULAR | Status: AC
  Filled 2021-12-19: qty 2

## 2021-12-19 MED ORDER — LIDOCAINE HCL (PF) 1 % IJ SOLN
INTRAMUSCULAR | Status: DC | PRN
  Administered 2021-12-19: 15 mL

## 2021-12-19 MED ORDER — OXYCODONE HCL 5 MG PO TABS
ORAL_TABLET | ORAL | Status: AC
  Filled 2021-12-19: qty 2

## 2021-12-19 MED ORDER — ACETAMINOPHEN 325 MG PO TABS: 650.0000 mg | ORAL_TABLET | ORAL | Status: DC | PRN

## 2021-12-19 MED ORDER — HEPARIN SODIUM (PORCINE) 1000 UNIT/ML IJ SOLN
INTRAMUSCULAR | Status: DC | PRN
  Administered 2021-12-19: 6000 [IU] via INTRAVENOUS

## 2021-12-19 MED ORDER — HEPARIN SODIUM (PORCINE) 1000 UNIT/ML IJ SOLN
INTRAMUSCULAR | Status: AC
  Filled 2021-12-19: qty 10

## 2021-12-19 MED ORDER — SODIUM CHLORIDE 0.9 % IV SOLN: INTRAVENOUS | Status: DC

## 2021-12-19 MED ORDER — SODIUM CHLORIDE 0.9 % IV SOLN: 250.0000 mL | INTRAVENOUS | Status: DC | PRN

## 2021-12-19 MED ORDER — LABETALOL HCL 5 MG/ML IV SOLN: 10.0000 mg | INTRAVENOUS | Status: DC | PRN

## 2021-12-19 SURGICAL SUPPLY — 20 items
BALLN MUSTANG 6X60X135 (BALLOONS) ×4
BALLOON MUSTANG 6X60X135 (BALLOONS) IMPLANT
CATH OMNI FLUSH 5F 65CM (CATHETERS) ×2 IMPLANT
DCB RANGER 5.0X40 135 (BALLOONS) IMPLANT
DEVICE CONTINUOUS FLUSH (MISCELLANEOUS) ×2 IMPLANT
GLIDEWIRE ADV .035X260CM (WIRE) ×2 IMPLANT
KIT ENCORE 26 ADVANTAGE (KITS) ×2 IMPLANT
KIT MICROPUNCTURE NIT STIFF (SHEATH) ×2 IMPLANT
KIT PV (KITS) ×5 IMPLANT
RANGER DCB 5.0X40 135 (BALLOONS) ×4
SHEATH PINNACLE 5F 10CM (SHEATH) ×2 IMPLANT
SHEATH PINNACLE 6F 10CM (SHEATH) ×2 IMPLANT
SHEATH PINNACLE ST 6F 45CM (SHEATH) ×2 IMPLANT
SHEATH PROBE COVER 6X72 (BAG) ×2 IMPLANT
STENT ELUVIA 7X60X130 (Permanent Stent) ×2 IMPLANT
SYR MEDRAD MARK V 150ML (SYRINGE) ×2 IMPLANT
TRANSDUCER W/STOPCOCK (MISCELLANEOUS) ×5 IMPLANT
TRAY PV CATH (CUSTOM PROCEDURE TRAY) ×5 IMPLANT
WIRE BENTSON .035X145CM (WIRE) ×2 IMPLANT
WIRE G V18X300CM (WIRE) ×2 IMPLANT

## 2021-12-19 NOTE — Op Note (Signed)
° ° °  Patient name: Roy [name] MRN: [id number] DOB: 1946/07/31 Sex: male  12/19/2021 Pre-operative Diagnosis: Claudication Post-operative diagnosis:  Same Surgeon:  Annamarie Major Procedure Performed:  1.  Ultrasound-guided access, right femoral artery  2.  Left lower extremity runoff  3.  Stent, left external iliac artery  4.  Drug-coated balloon angioplasty, left common femoral artery  5.  Conscious sedation, 35 minutes   Indications: The patient is back today to address a EXTR iliac stenosis that was seen on ultrasound and threatening his femoral-popliteal bypass graft  Procedure:  The patient was identified in the holding area and taken to room 8.  The patient was then placed supine on the table and prepped and draped in the usual sterile fashion.  A time out was called.  Conscious sedation was administered with the use of IV fentanyl and Versed under continuous physician and nurse monitoring.  Heart rate, blood pressure, and oxygen saturation were continuously monitored.  Total sedation time was 35 minutes.  Ultrasound was used to evaluate the right common femoral artery.  It was patent .  A digital ultrasound image was acquired.  A micropuncture needle was used to access the right common femoral artery under ultrasound guidance.  An 018 wire was advanced without resistance and a micropuncture sheath was placed.  The 018 wire was removed and a benson wire was placed.  The micropuncture sheath was exchanged for a 5 french sheath.  An omniflush catheter was advanced over the aortic bifurcation and placed in the left external iliac artery and left leg runoff was performed. Findings:    Left Lower Extremity: Approximate 60% stenosis was visualized in the distal left external iliac artery.  There is also approximately a 50% stenosis at the proximal anastomosis of his bypass graft and the common femoral artery.  The profundofemoral artery is widely patent.  The bypass graft is widely patent to the  popliteal artery.  There is two-vessel runoff via the anterior tibial and peroneal  Intervention: After the above images were acquired the decision was made to proceed with intervention.  Over a Glidewire advantage, a 6 French 45 cm sheath was advanced into the left iliac system.  The patient was fully heparinized.  I elected to primarily stent the external iliac lesion.  A 7 x 60 Elluvia stent was deployed in the left external iliac artery and postdilated with a 6 mm balloon.  I then switched out to a V-18 wire and used a 5 x 40 Ranger drug-coated balloon to treat the common femoral artery stenosis.  The balloon was taken to 6 atm for 3 minutes.  Completion imaging revealed resolution of the stenosis in the both lesions.  The sheath was then withdrawn to the right external iliac artery.  Patient taken holding area for sheath pull once his coagulation profile corrects.  Impression:  #1  Successful stenting of a greater than 60% left external iliac artery stenosis using a 7 x 60 Elluvia  #2  Successful drug-coated balloon angioplasty of a greater than 50% left common femoral artery/proximal anastomotic stenosis.   Theotis Burrow, M.D., [facility] [place] Office: [phone] Pager:  [phone]

## 2021-12-19 NOTE — Interval H&P Note (Signed)
History and Physical Interval Note:  12/19/2021 8:42 AM  Roy [name]  has presented today for surgery, with the diagnosis of pad.  The various methods of treatment have been discussed with the patient and family. After consideration of risks, benefits and other options for treatment, the patient has consented to  Procedure(s): ABDOMINAL AORTOGRAM W/LOWER EXTREMITY ([name]) as a surgical intervention.  The patient's history has been reviewed, patient examined, no change in status, stable for surgery.  I have reviewed the patient's chart and labs.  Questions were answered to the patient's satisfaction.     Annamarie Major

## 2021-12-19 NOTE — Discharge Instructions (Signed)
Femoral Site Care This sheet gives you information about how to care for yourself after your procedure. Your health care provider may also give you more specific instructions. If you have problems or questions, contact your health care provider. What can I expect after the procedure?  After the procedure, it is common to have: Bruising that usually fades within 1-2 weeks. Tenderness at the site. Follow these instructions at home: Wound care Follow instructions from your health care provider about how to take care of your insertion site. Make sure you: Wash your hands with soap and water before you change your bandage (dressing). If soap and water are not available, use hand sanitizer. Remove your dressing as told by your health care provider. In 24 hours Do not take baths, swim, or use a hot tub until your health care provider approves. You may shower 24-48 hours after the procedure or as told by your health care provider. Gently wash the site with plain soap and water. Pat the area dry with a clean towel. Do not rub the site. This may cause bleeding. Do not apply powder or lotion to the site. Keep the site clean and dry. Check your femoral site every day for signs of infection. Check for: Redness, swelling, or pain. Fluid or blood. Warmth. Pus or a bad smell. Activity For the first 2-3 days after your procedure, or as long as directed: Avoid climbing stairs as much as possible. Do not squat. Do not lift anything that is heavier than 10 lb (4.5 kg), or the limit that you are told, until your health care provider says that it is safe. For 5 days Rest as directed. Avoid sitting for a long time without moving. Get up to take short walks every 1-2 hours. Do not drive for 24 hours if you were given a medicine to help you relax (sedative). General instructions Take over-the-counter and prescription medicines only as told by your health care provider. Keep all follow-up visits as told by  your health care provider. This is important. Contact a health care provider if you have: A fever or chills. You have redness, swelling, or pain around your insertion site. Get help right away if: The catheter insertion area swells very fast. You pass out. You suddenly start to sweat or your skin gets clammy. The catheter insertion area is bleeding, and the bleeding does not stop when you hold steady pressure on the area. The area near or just beyond the catheter insertion site becomes pale, cool, tingly, or numb. These symptoms may represent a serious problem that is an emergency. Do not wait to see if the symptoms will go away. Get medical help right away. Call your local emergency services (911 in the [place] Do not drive yourself to the hospital. Summary After the procedure, it is common to have bruising that usually fades within 1-2 weeks. Check your femoral site every day for signs of infection. Do not lift anything that is heavier than 10 lb (4.5 kg), or the limit that you are told, until your health care provider says that it is safe. This information is not intended to replace advice given to you by your health care provider. Make sure you discuss any questions you have with your health care provider. Document Revised: 11/25/2017 Document Reviewed: 11/25/2017 Elsevier Patient Education  2020 Elsevier Inc. 

## 2021-12-19 NOTE — Progress Notes (Signed)
Site area: rt groin arterial sheath Site Prior to Removal:  Level 0 Pressure Applied For: 20 minutes Manual:   yes Patient Status During Pull:  stable Post Pull Site:  Level 0 Post Pull Instructions Given:  yes Post Pull Pulses Present: rt AT dopplered Dressing Applied:  gauze and tegaderm Bedrest begins @ 3167 Comments:

## 2021-12-20 ENCOUNTER — Encounter (HOSPITAL_COMMUNITY): Payer: Self-pay | Admitting: Surgery

## 2022-01-03 ENCOUNTER — Ambulatory Visit (INDEPENDENT_AMBULATORY_CARE_PROVIDER_SITE_OTHER): Payer: Medicare Other

## 2022-01-03 DIAGNOSIS — I255 Ischemic cardiomyopathy: Secondary | ICD-10-CM

## 2022-01-04 LAB — CUP PACEART REMOTE DEVICE CHECK
Battery Remaining Longevity: 19 mo
Battery Remaining Percentage: 23 %
Battery Voltage: 2.8 V
Brady Statistic AP VP Percent: 31 %
Brady Statistic AP VS Percent: 1 %
Brady Statistic AS VP Percent: 68 %
Brady Statistic AS VS Percent: 1 %
Brady Statistic RA Percent Paced: 30 %
Date Time Interrogation Session: 20230209040118
HighPow Impedance: 73 Ohm
HighPow Impedance: 73 Ohm
Implantable Lead Implant Date: 20160923
Implantable Lead Implant Date: 20160923
Implantable Lead Implant Date: 20160923
Implantable Lead Location: 753858
Implantable Lead Location: 753859
Implantable Lead Location: 753860
Implantable Lead Model: 7122
Implantable Pulse Generator Implant Date: 20160923
Lead Channel Impedance Value: 450 Ohm
Lead Channel Impedance Value: 480 Ohm
Lead Channel Impedance Value: 760 Ohm
Lead Channel Pacing Threshold Amplitude: 0.75 V
Lead Channel Pacing Threshold Amplitude: 0.75 V
Lead Channel Pacing Threshold Amplitude: 1.75 V
Lead Channel Pacing Threshold Pulse Width: 0.5 ms
Lead Channel Pacing Threshold Pulse Width: 0.5 ms
Lead Channel Pacing Threshold Pulse Width: 0.8 ms
Lead Channel Sensing Intrinsic Amplitude: 12 mV
Lead Channel Sensing Intrinsic Amplitude: 3.9 mV
Lead Channel Setting Pacing Amplitude: 2 V
Lead Channel Setting Pacing Amplitude: 2 V
Lead Channel Setting Pacing Amplitude: 2.5 V
Lead Channel Setting Pacing Pulse Width: 0.5 ms
Lead Channel Setting Pacing Pulse Width: 0.8 ms
Lead Channel Setting Sensing Sensitivity: 1 mV
Pulse Gen Serial Number: 7294918

## 2022-01-08 NOTE — Progress Notes (Signed)
Remote ICD transmission.   

## 2022-01-16 DIAGNOSIS — H31011 Macula scars of posterior pole (postinflammatory) (post-traumatic), right eye: Secondary | ICD-10-CM | POA: Diagnosis not present

## 2022-01-16 DIAGNOSIS — H26493 Other secondary cataract, bilateral: Secondary | ICD-10-CM | POA: Diagnosis not present

## 2022-02-05 ENCOUNTER — Other Ambulatory Visit: Payer: Self-pay

## 2022-02-05 ENCOUNTER — Encounter: Payer: Self-pay | Admitting: Orthopedic Surgery

## 2022-02-05 ENCOUNTER — Ambulatory Visit: Payer: Medicare Other

## 2022-02-05 ENCOUNTER — Ambulatory Visit (INDEPENDENT_AMBULATORY_CARE_PROVIDER_SITE_OTHER): Payer: Medicare Other | Admitting: Orthopedic Surgery

## 2022-02-05 VITALS — BP 163/70 | HR 74 | Ht 63.0 in | Wt 152.0 lb

## 2022-02-05 DIAGNOSIS — I255 Ischemic cardiomyopathy: Secondary | ICD-10-CM

## 2022-02-05 DIAGNOSIS — M79672 Pain in left foot: Secondary | ICD-10-CM

## 2022-02-05 NOTE — Progress Notes (Signed)
Chief Complaint  Patient presents with   Foot Injury    Left foot DOI 01/22/22   76 year old male had a large object on his left foot changing the bed support.  He is a concerned because his father lost his foot in life due to gangrene from a foot injury  He is already getting better but still wants to get it checked out  He does smoke he has a defibrillator   Past Medical History:  Diagnosis Date   AICD (automatic cardioverter/defibrillator) present 08/19/2015   St Jude BiV ICD for primary prevention by Dr. Lovena Le   Alcohol abuse    6 beers per day; hospital admission in 2009 for withdrawal symptoms   Alcoholic cirrhosis ([place])    Anxiety and depression    denies    Cerebrovascular disease 2009   TIA; 2009- right ICA stent; re-intervention for restenosis complicated by [facility] w/o sx   CHF (congestive heart failure) ([place]) 06-02-14   Chronic obstructive pulmonary disease ([place])    Degenerative joint disease    Total shoulder arthroplasty-right   Hyperlipidemia    Hypertension    LBBB (left bundle branch block)    Normal echo-2011; stress nuclear in 09/2010--septal hypoperfusion representing nontransmural infarction or the effect of left bundle branch block, no ischemia   Myocardial infarction [facility]) June 02, 2014   Massive Heart Attack   Peripheral vascular disease ([place])    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   Thrombocytopenia ([place])    Tobacco abuse    -100 pack years; 1.5 packs per day   Traumatic seroma of thigh ([place])    left   Tubular adenoma of colon    Past Surgical History:  Procedure Laterality Date   ABDOMINAL AORTAGRAM [name] 05/04/2014   Procedure: ABDOMINAL Maxcine [place];  Surgeon: [name], MD;  Location[facility] CATH LAB;  Service: Cardiovascular;  Laterality: [name];   ABDOMINAL AORTOGRAM [name] 06/25/2017   Procedure: Abdominal Aortogram;  Surgeon: [name], MD;  Location: [place] CV LAB;  Service: Cardiovascular;  Laterality: [name];   ABDOMINAL AORTOGRAM  W/LOWER EXTREMITY [name] 04/22/2018   Procedure: ABDOMINAL AORTOGRAM W/LOWER EXTREMITY;  Surgeon: [name], MD;  Location: [place] CV LAB;  Service: Cardiovascular;  Laterality: [name];   ABDOMINAL AORTOGRAM W/LOWER EXTREMITY [name] 03/08/2020   Procedure: ABDOMINAL AORTOGRAM W/LOWER EXTREMITY;  Surgeon: [name], MD;  Location: [place] CV LAB;  Service: Cardiovascular;  Laterality: [name];   ABDOMINAL AORTOGRAM W/LOWER EXTREMITY Bilateral 08/23/2020   Procedure: ABDOMINAL AORTOGRAM W/LOWER EXTREMITY;  Surgeon: [name], MD;  Location: [place] CV LAB;  Service: Cardiovascular;  Laterality: Bilateral;   ABDOMINAL AORTOGRAM W/LOWER EXTREMITY [name] 10/31/2021   Procedure: ABDOMINAL AORTOGRAM W/LOWER EXTREMITY;  Surgeon: [name], MD;  Location: [place] CV LAB;  Service: Cardiovascular;  Laterality: [name];   ABDOMINAL AORTOGRAM W/LOWER EXTREMITY [name] 12/19/2021   Procedure: ABDOMINAL AORTOGRAM W/LOWER EXTREMITY;  Surgeon: [name], MD;  Location: [place] CV LAB;  Service: Cardiovascular;  Laterality: [name];   AGILE CAPSULE [name] 03/18/2014   Procedure: AGILE CAPSULE;  Surgeon: Danie Binder, MD;  [facility];  Service: Endoscopy;  Laterality: [name];  0:16   APPLICATION OF WOUND VAC Left 09/03/2017   Procedure: APPLICATION OF WOUND VAC;  Surgeon: Conrad [place], MD;  Location: [place];  Service: Vascular;  Laterality: Left;   APPLICATION OF WOUND VAC Right 04/07/2020   Procedure: PLACEMENT OF ANTIBIOTIC BEADS AND APPLICATION OF WOUND VAC;  Surgeon: [name], MD;  Location: [facility] OR;  Service: Vascular;  Laterality: Right;   BACK SURGERY     BACTERIAL OVERGROWTH TEST [name] 05/24/2015   Procedure: BACTERIAL OVERGROWTH TEST;  Surgeon: Danie Binder, MD;  [facility];  Service: Endoscopy;  Laterality: [name];  0700   BI-VENTRICULAR IMPLANTABLE CARDIOVERTER DEFIBRILLATOR  (CRT-D)  08/19/2015   CARPAL TUNNEL RELEASE Left 02/02/2016   Procedure: LEFT CARPAL  TUNNEL RELEASE;  Surgeon: Daryll Brod, MD;  Location: [place];  Service: Orthopedics;  Laterality: Left;  ANESTHESIA: IV REGIONAL UPPER ARM   CATARACT EXTRACTION W/PHACO Right 03/08/2015   Procedure: CATARACT EXTRACTION PHACO AND INTRAOCULAR LENS PLACEMENT (IOC);  Surgeon: Rutherford Guys, MD;  Location: [facility] ORS;  Service: Ophthalmology;  Laterality: Right;  CDE:9.46   CATARACT EXTRACTION W/PHACO Left 03/22/2015   Procedure: CATARACT EXTRACTION PHACO AND INTRAOCULAR LENS PLACEMENT (IOC);  Surgeon: Rutherford Guys, MD;  Location: [facility];  Service: Ophthalmology;  Laterality: Left;  CDE:5.80   COLONOSCOPY  08/22/09   Fields-(Tubular Adenoma)3-mm transverse polyp/4-mm polyp otherwise noraml/small internal hemorrhoids   COLONOSCOPY [name] 04/14/2014   SPQ:ZRAQT internal hemorrhids/normal mocsa in the terminal iluem/left colonis redundant   COLONOSCOPY W/ POLYPECTOMY  2011   ENDARTERECTOMY FEMORAL Left 08/16/2017   Procedure: ENDARTERECTOMY LEFT PROFUNDA FEMORAL;  Surgeon: [name], MD;  Location: [facility] OR;  Service: Vascular;  Laterality: Left;   ENDARTERECTOMY FEMORAL Right 03/31/2020   Procedure: RIGHT FEMORAL ENDARTERECTOMY;  Surgeon: [name], MD;  Location: [place];  Service: Vascular;  Laterality: Right;   EP IMPLANTABLE DEVICE [name] 08/19/2015   Procedure: BiV ICD Insertion CRT-D;  Surgeon: [place] Lance, MD;  Location: [place] CV LAB;  Service: Cardiovascular;  Laterality: [name];   ESOPHAGOGASTRODUODENOSCOPY [name] 03/05/2014   SLF: 1. Stricture at the gastroesophagael junction 2. Small hiatal hernia 3. Moderate non-erosive gastritis and duodentitis. 4. No surce for Melena identified.    FEMORAL-POPLITEAL BYPASS GRAFT Left 08/16/2017   Procedure: LEFT  FEMORAL-POPLITEAL ARTERY  BYPASS GRAFT;  Surgeon: [name], MD;  Location: [place];  Service: Vascular;  Laterality: Left;   GIVENS CAPSULE STUDY [name] 03/30/2014   Procedure: GIVENS CAPSULE STUDY;  Surgeon: Danie Binder, MD;   [facility];  Service: Endoscopy;  Laterality: [name];  7:30   GROIN DEBRIDEMENT Right 04/07/2020   Procedure: INCISION AND DRAINAGE OF RIGHT GROIN;  Surgeon: [name], MD;  Location: [facility] OR;  Service: Vascular;  Laterality: Right;   I & D EXTREMITY Left 09/03/2017   Procedure: IRRIGATION AND DEBRIDEMENT EXTREMITY LEFT THIGH SEROMA;  Surgeon: [place] Ki[place], MD;  Location: [place];  Service: Vascular;  Laterality: Left;   INSERT / REPLACE / [place]  03/31/2020   Procedure: INSERTION OF RIGHT ILIAC ARTERY STENT AND RIGHT SUPERFICIAL FEMORAL ARTERY STENT;  Surgeon: [name], MD;  [facility] OR;  Service: Vascular;;   IR ANGIO INTRA EXTRACRAN SEL COM CAROTID INNOMINATE BILAT MOD SED  07/16/2017   IR ANGIO INTRA EXTRACRAN SEL COM CAROTID INNOMINATE BILAT MOD SED  06/12/2019   IR ANGIO INTRA EXTRACRAN SEL COM CAROTID INNOMINATE BILAT MOD SED  01/17/2021   IR ANGIO VERTEBRAL SEL SUBCLAVIAN INNOMINATE BILAT MOD SED  07/16/2017   IR ANGIO VERTEBRAL SEL SUBCLAVIAN INNOMINATE BILAT MOD SED  06/12/2019   IR ANGIO VERTEBRAL SEL SUBCLAVIAN INNOMINATE BILAT MOD SED  01/17/2021   IR RADIOLOGIST EVAL & MGMT  04/01/2017   IR RADIOLOGIST EVAL & MGMT  08/02/2017   IR TRANSCATH EXCRAN VERT OR CAR A STENT  07/22/2019   IR US GUIDE VASC ACCESS RIGHT  06/12/2019   IR US GUIDE VASC ACCESS RIGHT  01/17/2021   JOINT REPLACEMENT Right    Total Shoulder Replacement    LOWER EXTREMITY ANGIOGRAPHY Bilateral 06/25/2017   Procedure: Lower Extremity Angiography;  Surgeon: [name], MD;  Location: [place] CV LAB;  Service: Cardiovascular;  Laterality: Bilateral;   LUMBAR FUSION  2010   PATCH ANGIOPLASTY Right 03/31/2020   Procedure: PATCH ANGIOPLASTY USING XENOSURE BIOLOGIC PATCH 1cm x 14cm;  Surgeon: [name], MD;  Location: [facility] OR;  Service: Vascular;  Laterality: Right;   PERIPHERAL VASCULAR ATHERECTOMY Right 04/22/2018   Procedure: PERIPHERAL VASCULAR  ATHERECTOMY;  Surgeon: [name], MD;  Location: [place] CV LAB;  Service: Cardiovascular;  Laterality: Right;  right superficial femoral   PERIPHERAL VASCULAR BALLOON ANGIOPLASTY Right 04/22/2018   Procedure: PERIPHERAL VASCULAR BALLOON ANGIOPLASTY;  Surgeon: [name], MD;  Location: [place] CV LAB;  Service: Cardiovascular;  Laterality: Right;  external iliac   PERIPHERAL VASCULAR BALLOON ANGIOPLASTY Left 03/08/2020   Procedure: PERIPHERAL VASCULAR BALLOON ANGIOPLASTY;  Surgeon: [name], MD;  Location: [place] CV LAB;  Service: Cardiovascular;  Laterality: Left;  Common Femoral   PERIPHERAL VASCULAR BALLOON ANGIOPLASTY Left 08/23/2020   Procedure: PERIPHERAL VASCULAR BALLOON ANGIOPLASTY;  Surgeon: [name], MD;  Location: [place] CV LAB;  Service: Cardiovascular;  Laterality: Left;  common femoral   PERIPHERAL VASCULAR BALLOON ANGIOPLASTY Left 10/31/2021   Procedure: PERIPHERAL VASCULAR BALLOON ANGIOPLASTY;  Surgeon: [name], MD;  Location: [place] CV LAB;  Service: Cardiovascular;  Laterality: Left;  external iliac   PERIPHERAL VASCULAR INTERVENTION Right 03/08/2020   Procedure: PERIPHERAL VASCULAR INTERVENTION;  Surgeon: [name], MD;  Location: [place] CV LAB;  Service: Cardiovascular;  Laterality: Right;  SFA   PERIPHERAL VASCULAR INTERVENTION Right 08/23/2020   Procedure: PERIPHERAL VASCULAR INTERVENTION;  Surgeon: [name], MD;  Location: [place] CV LAB;  Service: Cardiovascular;  Laterality: Right;  external iliac   PERIPHERAL VASCULAR INTERVENTION Right 10/31/2021   Procedure: PERIPHERAL VASCULAR INTERVENTION;  Surgeon: [name], MD;  Location: [place] CV LAB;  Service: Cardiovascular;  Laterality: Right;  superficial femoral artery   PERIPHERAL VASCULAR INTERVENTION Left 12/19/2021   Procedure: PERIPHERAL VASCULAR INTERVENTION;  Surgeon: [name], MD;  Location: [place] CV LAB;  Service:  Cardiovascular;  Laterality: Left;  stent lt ext iliac / pta common femoral   RADIOLOGY WITH ANESTHESIA [name] 07/01/2019   Procedure: STENTING;  Surgeon: Luanne Bras, MD;  Location: [place];  Service: Radiology;  Laterality: [name];   RADIOLOGY WITH ANESTHESIA [name] 07/22/2019   Procedure: STENTING;  Surgeon: Luanne Bras, MD;  Location: [place];  Service: Radiology;  Laterality: [name];   TOTAL SHOULDER ARTHROPLASTY Right 2011   Dr. Rafael Bihari NERVE TRANSPOSITION Left 02/02/2016   Procedure: LEFT IN-SITU DECOMPRESSION ULNAR NERVE ;  Surgeon: Daryll Brod, MD;  Location: [place];  Service: Orthopedics;  Laterality: Left;   ULNAR TUNNEL RELEASE Left 02/02/2016   Procedure: LEFT CUBITAL TUNNEL RELEASE;  Surgeon: Daryll Brod, MD;  Location: [place];  Service: Orthopedics;  Laterality: Left;   [place] Left 08/16/2017   Procedure: USING NON REVERSE LEFT GREATER SAPHENOUS VEIN HARVEST;  Surgeon: [name],  Butch Penny, MD;  [facility];  Service: Vascular;  Laterality: Left;   BP (!) 163/70    Pulse 74    Ht '5\' 3"'$  (1.6 m)    Wt 152 lb (68.9 kg)    BMI 26.93 kg/m   His appearance is normal no deformities ectomorphic body habitus  Oriented x3  Mood affect pleasant  Gait normal  Left foot has 2 marks on the top of it and a bruise is tender over the second and third metatarsals tarsals proximally ankle range of motion normal the skin is discolored from the bruising no atrophy good pulse perfusion and sensation  X-ray negative  Impression contusion left foot  Plan normal activity  Encounter Diagnosis  Name Primary?   Acute pain of left foot Yes

## 2022-03-11 DIAGNOSIS — Z20822 Contact with and (suspected) exposure to covid-19: Secondary | ICD-10-CM | POA: Diagnosis not present

## 2022-03-15 ENCOUNTER — Other Ambulatory Visit: Payer: Self-pay

## 2022-03-15 DIAGNOSIS — I739 Peripheral vascular disease, unspecified: Secondary | ICD-10-CM

## 2022-03-19 ENCOUNTER — Encounter (HOSPITAL_COMMUNITY): Payer: Medicare Other

## 2022-03-26 ENCOUNTER — Encounter: Payer: Self-pay | Admitting: Surgery

## 2022-03-26 ENCOUNTER — Ambulatory Visit (INDEPENDENT_AMBULATORY_CARE_PROVIDER_SITE_OTHER): Payer: Medicare Other | Admitting: Surgery

## 2022-03-26 ENCOUNTER — Ambulatory Visit (INDEPENDENT_AMBULATORY_CARE_PROVIDER_SITE_OTHER)
Admission: RE | Admit: 2022-03-26 | Discharge: 2022-03-26 | Disposition: A | Discharge status: Home / Self Care | Payer: Medicare Other | Source: Ambulatory Visit | Attending: Surgery | Admitting: Surgery

## 2022-03-26 ENCOUNTER — Ambulatory Visit (HOSPITAL_COMMUNITY)
Admission: RE | Admit: 2022-03-26 | Discharge: 2022-03-26 | Disposition: A | Discharge status: Home / Self Care | Payer: Medicare Other | Source: Ambulatory Visit | Attending: Surgery | Admitting: Surgery

## 2022-03-26 VITALS — BP 146/78 | HR 60 | Temp 98.1°F | Resp 20 | Ht 63.0 in | Wt 150.0 lb

## 2022-03-26 DIAGNOSIS — I70213 Atherosclerosis of native arteries of extremities with intermittent claudication, bilateral legs: Secondary | ICD-10-CM | POA: Diagnosis not present

## 2022-03-26 DIAGNOSIS — I739 Peripheral vascular disease, unspecified: Secondary | ICD-10-CM

## 2022-03-26 DIAGNOSIS — I255 Ischemic cardiomyopathy: Secondary | ICD-10-CM | POA: Diagnosis not present

## 2022-03-26 NOTE — Progress Notes (Signed)
? ?[facility] [place] ? ?Patient name: ANTOINNE [name] MRN: [id number] DOB: 1946-03-23 Sex: male ? ? ?REASON FOR VISIT:  ? ? ?Follow up ? ?HISOTRY OF PRESENT ILLNESS:  ? ? ?Roy [name] is a 76 y.o. male who has undergone the following ?  ?2015:  Bilateral iliac stenting (claudication) ?08-16-2017: Left femoral to above-knee popliteal bypass graft with ipsilateral saphenous vein ?09/03/2017: I&D of a distal incision fluid collection ?04/22/2018: Right superficial femoral artery atherectomy and drug-coated balloon angioplasty.  Angioplasty of right external iliac artery ?03/08/2020: Right superficial femoral artery stent, drug-coated balloon angioplasty left common femoral artery ?03/31/2020: Right iliofemoral endarterectomy with bovine patch angioplasty and right common iliac stent, right superficial femoral artery stent ?04/07/2020: I&D of right groin seroma ?08/23/2020: Drug-coated balloon angioplasty, left common femoral artery.  Stent right external iliac artery ?10/31/2021: Stent, right superficial femoral artery, angioplasty, left external iliac artery ?12/19/2021: Stent left external iliac artery, drug-coated balloon angioplasty, left common femoral artery ? ?He is maintained on aspirin Brilinta and statin.  He has an ICD in place.  He has a history of TIA in 2009 with right ICA stent.  He suffers from congestive heart failure as well as COPD.  He is medically managed for hypertension.  He is a chronic smoker. ? ?He has no complaints today.  He denies claudication. ? ?PAST MEDICAL HISTORY:  ? ?Past Medical History:  ?Diagnosis Date  ? AICD (automatic cardioverter/defibrillator) present 08/19/2015  ? St Jude BiV ICD for primary prevention by Dr. Lovena Le  ? Alcohol abuse   ? 6 beers per day; hospital admission in 2009 for withdrawal symptoms  ? Alcoholic cirrhosis ([place])   ? Anxiety and depression   ? denies   ? Cerebrovascular disease 2009  ? TIA; 2009- right ICA  stent; re-intervention for restenosis complicated by [facility] w/o sx  ? CHF (congestive heart failure) ([place]) 06-02-14  ? Chronic obstructive pulmonary disease ([facility])   ? Degenerative joint disease   ? Total shoulder arthroplasty-right  ? Hyperlipidemia   ? Hypertension   ? LBBB (left bundle branch block)   ? Normal echo-2011; stress nuclear in 09/2010--septal hypoperfusion representing nontransmural infarction or the effect of left bundle branch block, no ischemia  ? Myocardial infarction [facility]) June 02, 2014  ? Massive Heart Attack  ? Peripheral vascular disease ([place])   ? 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  ? Thrombocytopenia ([place])   ? Tobacco abuse   ? -100 pack years; 1.5 packs per day  ? Traumatic seroma of thigh ([place])   ? left  ? Tubular adenoma of colon   ? ? ? ?FAMILY HISTORY:  ? ?Family History  ?Problem Relation Age of Onset  ? Coronary artery disease Mother   ? Diabetes Mother   ? Heart disease Mother   ?     Before age 47 - 66 Bypasses  ? Hypertension Mother   ? Heart attack Mother   ?     3-4 Heart attacks  ? Alzheimer's disease Father   ? Diabetes Father   ? Heart attack Sister   ? Cancer Brother   ?     Prostate  ? Hyperlipidemia Son   ? Prostate cancer Brother   ? Alzheimer's disease Sister   ? Colon cancer Neg Hx   ? ? ?SOCIAL HISTORY:  ? ?Social History  ? ?Tobacco Use  ? Smoking status: Every Day  ?  Packs/day: 1.00  ?  Years: 60.00  ?  Pack years: 60.00  ?  Types: Cigarettes  ?  Start date: 08/20/1957  ? Smokeless tobacco: Never  ?Substance Use Topics  ? Alcohol use: No  ?  Alcohol/week: 0.0 standard drinks  ?  Comment: quit in July 2015  ? ? ? ?ALLERGIES:  ? ?No Known Allergies ? ? ?CURRENT MEDICATIONS:  ? ?Current Outpatient Medications  ?Medication Sig Dispense Refill  ? acetaminophen (TYLENOL) 500 MG tablet Take 1,000 mg by mouth every 8 (eight) hours as needed for moderate pain or headache.    ? aspirin EC 81 MG tablet Take 81 mg by mouth every evening.     ? atorvastatin (LIPITOR) 20 MG  tablet Take 20 mg by mouth every evening.    ? BRILINTA 90 MG TABS tablet Take 90 mg by mouth 2 (two) times daily.    ? carvedilol (COREG) 3.125 MG tablet TAKE 1 TABLET TWICE A DAY 180 tablet 0  ? cetirizine (ZYRTEC) 10 MG tablet Take 10 mg by mouth daily.    ? dextromethorphan-guaiFENesin (MUCINEX DM) 30-600 MG 12hr tablet Take 1 tablet by mouth daily.     ? gabapentin (NEURONTIN) 300 MG capsule Take 300 mg by mouth 2 (two) times daily.     ? HYDROcodone-acetaminophen (NORCO) 10-325 MG tablet Take 1 tablet by mouth every 6 (six) hours as needed for severe pain.    ? magnesium oxide (MAG-OX) 400 MG tablet Take 400 mg by mouth every evening.    ? Multiple Vitamins-Minerals (CENTRUM SILVER PO) Take 1 tablet by mouth daily. 50+    ? naphazoline-pheniramine (VISINE) 0.025-0.3 % ophthalmic solution 1 drop daily as needed for eye irritation (itching).    ? Omega-3 Fatty Acids (FISH OIL) 1000 MG CAPS Take 1,000 mg by mouth 2 (two) times daily.    ? sacubitril-valsartan (ENTRESTO) 97-103 MG Take 1 tablet by mouth 2 (two) times daily. 180 tablet 3  ? Salicylic Acid (GOLD BOND PSORIASIS RELIEF) 3 % CREA Apply 1 application topically daily as needed (psoriasis).    ? silodosin (RAPAFLO) 8 MG CAPS capsule Take 8 mg by mouth daily with breakfast.    ? SUPER B COMPLEX/C PO Take 1 tablet by mouth every evening.    ? tamsulosin (FLOMAX) 0.4 MG CAPS capsule Take 0.4 mg by mouth daily after supper.    ? traMADol (ULTRAM) 50 MG tablet Take 50 mg by mouth daily as needed for moderate pain.     ? ?No current facility-administered medications for this visit.  ? ? ?REVIEW OF SYSTEMS:  ? ?'[X]'$  denotes positive finding, '[ ]'$  denotes negative finding ?Cardiac  Comments:  ?Chest pain or chest pressure:    ?Shortness of breath upon exertion:    ?Short of breath when lying flat:    ?Irregular heart rhythm:    ?    ?Vascular    ?Pain in calf, thigh, or hip brought on by ambulation:    ?Pain in feet at night that wakes you up from your sleep:      ?Blood clot in your veins:    ?Leg swelling:     ?    ?Pulmonary    ?Oxygen at home:    ?Productive cough:     ?Wheezing:     ?    ?Neurologic    ?Sudden weakness in arms or legs:     ?Sudden numbness in arms or legs:     ?Sudden onset of difficulty speaking or slurred speech:    ?Temporary loss of vision  in one eye:     ?Problems with dizziness:     ?    ?Gastrointestinal    ?Blood in stool:     ?Vomited blood:     ?    ?Genitourinary    ?Burning when urinating:     ?Blood in urine:    ?    ?Psychiatric    ?Major depression:     ?    ?Hematologic    ?Bleeding problems:    ?Problems with blood clotting too easily:    ?    ?Skin    ?Rashes or ulcers:    ?    ?Constitutional    ?Fever or chills:    ? ? ?PHYSICAL EXAM:  ? ?Vitals:  ? 03/26/22 1050  ?BP: (!) 146/78  ?Pulse: 60  ?Resp: 20  ?Temp: 98.1 ?F (36.7 ?C)  ?SpO2: 95%  ?Weight: 150 lb (68 kg)  ?Height: '5\' 3"'$  (1.6 m)  ? ? ?GENERAL: The patient is a well-nourished male, in no acute distress. The vital signs are documented above. ?CARDIAC: There is a regular rate and rhythm.  ?VASCULAR: Palpable dorsalis pedis pulses bilaterally ?PULMONARY: Non-labored respirations ?MUSCULOSKELETAL: There are no major deformities or cyanosis. ?NEUROLOGIC: No focal weakness or paresthesias are detected. ?SKIN: There are no ulcers or rashes noted. ?PSYCHIATRIC: The patient has a normal affect. ? ?STUDIES:  ? ?I have reviewed the following: ?+-------+-----------+-----------+------------+------------+  ?ABI/TBIToday's ABIToday's TBIPrevious ABIPrevious TBI  ?+-------+-----------+-----------+------------+------------+  ?Right  0.98       0.87       1.01        0.85          ?+-------+-----------+-----------+------------+------------+  ?Left   1.01       0.27       0.86        0.72          ?+--Right: Widely patent external iliac stent.  ?Elevated velocities involving the SFA stent inflow (mid-distal CFA) with  ?velocities suggesting a 50-74% stenosis.  ?Native outflow  velocities suggest a 1-49% stenosis.  ? ?Left: Widely patent external iliac artery stent.  ?Elevated velocities involving the bypass inflow suggests a 50-74%  ?stenosis. Widely patent bypass. -----+-----------+-----------

## 2022-03-30 DIAGNOSIS — Z20822 Contact with and (suspected) exposure to covid-19: Secondary | ICD-10-CM | POA: Diagnosis not present

## 2022-04-02 ENCOUNTER — Other Ambulatory Visit: Payer: Self-pay | Admitting: *Deleted

## 2022-04-02 DIAGNOSIS — I70213 Atherosclerosis of native arteries of extremities with intermittent claudication, bilateral legs: Secondary | ICD-10-CM

## 2022-04-02 DIAGNOSIS — I739 Peripheral vascular disease, unspecified: Secondary | ICD-10-CM

## 2022-04-02 DIAGNOSIS — R0989 Other specified symptoms and signs involving the circulatory and respiratory systems: Secondary | ICD-10-CM

## 2022-04-04 ENCOUNTER — Ambulatory Visit (INDEPENDENT_AMBULATORY_CARE_PROVIDER_SITE_OTHER): Payer: Medicare Other

## 2022-04-04 DIAGNOSIS — I255 Ischemic cardiomyopathy: Secondary | ICD-10-CM | POA: Diagnosis not present

## 2022-04-04 LAB — CUP PACEART REMOTE DEVICE CHECK
Battery Remaining Longevity: 20 mo
Battery Remaining Percentage: 25 %
Battery Voltage: 2.81 V
Brady Statistic AP VP Percent: 31 %
Brady Statistic AP VS Percent: 1 %
Brady Statistic AS VP Percent: 67 %
Brady Statistic AS VS Percent: 1 %
Brady Statistic RA Percent Paced: 31 %
Date Time Interrogation Session: 20230510020017
HighPow Impedance: 82 Ohm
HighPow Impedance: 82 Ohm
Implantable Lead Implant Date: 20160923
Implantable Lead Implant Date: 20160923
Implantable Lead Implant Date: 20160923
Implantable Lead Location: 753858
Implantable Lead Location: 753859
Implantable Lead Location: 753860
Implantable Lead Model: 7122
Implantable Pulse Generator Implant Date: 20160923
Lead Channel Impedance Value: 440 Ohm
Lead Channel Impedance Value: 480 Ohm
Lead Channel Impedance Value: 800 Ohm
Lead Channel Pacing Threshold Amplitude: 0.75 V
Lead Channel Pacing Threshold Amplitude: 0.75 V
Lead Channel Pacing Threshold Amplitude: 1.75 V
Lead Channel Pacing Threshold Pulse Width: 0.5 ms
Lead Channel Pacing Threshold Pulse Width: 0.5 ms
Lead Channel Pacing Threshold Pulse Width: 0.8 ms
Lead Channel Sensing Intrinsic Amplitude: 12 mV
Lead Channel Sensing Intrinsic Amplitude: 4.1 mV
Lead Channel Setting Pacing Amplitude: 2 V
Lead Channel Setting Pacing Amplitude: 2 V
Lead Channel Setting Pacing Amplitude: 2.5 V
Lead Channel Setting Pacing Pulse Width: 0.5 ms
Lead Channel Setting Pacing Pulse Width: 0.8 ms
Lead Channel Setting Sensing Sensitivity: 1 mV
Pulse Gen Serial Number: 7294918

## 2022-04-12 NOTE — Progress Notes (Signed)
Remote ICD transmission.   

## 2022-05-16 DIAGNOSIS — R339 Retention of urine, unspecified: Secondary | ICD-10-CM | POA: Diagnosis not present

## 2022-05-16 DIAGNOSIS — E782 Mixed hyperlipidemia: Secondary | ICD-10-CM | POA: Diagnosis not present

## 2022-05-16 DIAGNOSIS — R7303 Prediabetes: Secondary | ICD-10-CM | POA: Diagnosis not present

## 2022-05-21 DIAGNOSIS — I5022 Chronic systolic (congestive) heart failure: Secondary | ICD-10-CM | POA: Diagnosis not present

## 2022-05-21 DIAGNOSIS — E875 Hyperkalemia: Secondary | ICD-10-CM | POA: Diagnosis not present

## 2022-05-21 DIAGNOSIS — F17200 Nicotine dependence, unspecified, uncomplicated: Secondary | ICD-10-CM | POA: Diagnosis not present

## 2022-05-21 DIAGNOSIS — R7303 Prediabetes: Secondary | ICD-10-CM | POA: Diagnosis not present

## 2022-05-21 DIAGNOSIS — M25551 Pain in right hip: Secondary | ICD-10-CM | POA: Diagnosis not present

## 2022-05-21 DIAGNOSIS — E782 Mixed hyperlipidemia: Secondary | ICD-10-CM | POA: Diagnosis not present

## 2022-05-21 DIAGNOSIS — I10 Essential (primary) hypertension: Secondary | ICD-10-CM | POA: Diagnosis not present

## 2022-05-21 DIAGNOSIS — K7460 Unspecified cirrhosis of liver: Secondary | ICD-10-CM | POA: Diagnosis not present

## 2022-05-21 DIAGNOSIS — M545 Low back pain, unspecified: Secondary | ICD-10-CM | POA: Diagnosis not present

## 2022-05-21 DIAGNOSIS — D509 Iron deficiency anemia, unspecified: Secondary | ICD-10-CM | POA: Diagnosis not present

## 2022-05-21 DIAGNOSIS — I739 Peripheral vascular disease, unspecified: Secondary | ICD-10-CM | POA: Diagnosis not present

## 2022-05-21 DIAGNOSIS — I671 Cerebral aneurysm, nonruptured: Secondary | ICD-10-CM | POA: Diagnosis not present

## 2022-05-23 ENCOUNTER — Ambulatory Visit (INDEPENDENT_AMBULATORY_CARE_PROVIDER_SITE_OTHER): Payer: Medicare Other | Admitting: Internal Medicine

## 2022-05-23 ENCOUNTER — Encounter: Payer: Self-pay | Admitting: Internal Medicine

## 2022-05-23 VITALS — BP 132/68 | HR 60 | Ht 63.0 in | Wt 151.8 lb

## 2022-05-23 DIAGNOSIS — I255 Ischemic cardiomyopathy: Secondary | ICD-10-CM

## 2022-05-23 NOTE — Progress Notes (Signed)
HPI [name] returns today for followup. He is a pleasant 76 yo man with a h/o chronic systolic heart failure, s/p biv ICD insertion. He has had some noise on his ICD lead and we have reprogrammed his device. He has not had syncope. When we saw him last 3 months ago, we gave him a magnet to keep with him. He has not had chest pain or sob.  No Known Allergies   Current Outpatient Medications  Medication Sig Dispense Refill   acetaminophen (TYLENOL) 500 MG tablet Take 1,000 mg by mouth every 8 (eight) hours as needed for moderate pain or headache.     aspirin EC 81 MG tablet Take 81 mg by mouth every evening.      atorvastatin (LIPITOR) 20 MG tablet Take 20 mg by mouth every evening.     BRILINTA 90 MG TABS tablet Take 90 mg by mouth 2 (two) times daily.     carvedilol (COREG) 3.125 MG tablet TAKE 1 TABLET TWICE A DAY 180 tablet 0   cetirizine (ZYRTEC) 10 MG tablet Take 10 mg by mouth daily.     dextromethorphan-guaiFENesin (MUCINEX DM) 30-600 MG 12hr tablet Take 1 tablet by mouth daily.      gabapentin (NEURONTIN) 300 MG capsule Take 300 mg by mouth 2 (two) times daily.      HYDROcodone-acetaminophen (NORCO) 10-325 MG tablet Take 1 tablet by mouth every 6 (six) hours as needed for severe pain.     magnesium oxide (MAG-OX) 400 MG tablet Take 400 mg by mouth every evening.     Multiple Vitamins-Minerals (CENTRUM SILVER PO) Take 1 tablet by mouth daily. 50+     naphazoline-pheniramine (VISINE) 0.025-0.3 % ophthalmic solution 1 drop daily as needed for eye irritation (itching).     Omega-3 Fatty Acids (FISH OIL) 1000 MG CAPS Take 1,000 mg by mouth 2 (two) times daily.     sacubitril-valsartan (ENTRESTO) 97-103 MG Take 1 tablet by mouth 2 (two) times daily. 315 tablet 3   Salicylic Acid (GOLD BOND PSORIASIS RELIEF) 3 % CREA Apply 1 application topically daily as needed (psoriasis).     silodosin (RAPAFLO) 8 MG CAPS capsule Take 8 mg by mouth daily with breakfast.     SUPER B COMPLEX/C PO  Take 1 tablet by mouth every evening.     tamsulosin (FLOMAX) 0.4 MG CAPS capsule Take 0.4 mg by mouth daily after supper.     traMADol (ULTRAM) 50 MG tablet Take 50 mg by mouth daily as needed for moderate pain.      No current facility-administered medications for this visit.     Past Medical History:  Diagnosis Date   AICD (automatic cardioverter/defibrillator) present 08/19/2015   St Jude BiV ICD for primary prevention by Dr. Lovena Le   Alcohol abuse    6 beers per day; hospital admission in 2009 for withdrawal symptoms   Alcoholic cirrhosis ([place])    Anxiety and depression    denies    Cerebrovascular disease 2009   TIA; 2009- right ICA stent; re-intervention for restenosis complicated by [facility] w/o sx   CHF (congestive heart failure) ([place]) 06-02-14   Chronic obstructive pulmonary disease ([place])    Degenerative joint disease    Total shoulder arthroplasty-right   Hyperlipidemia    Hypertension    LBBB (left bundle branch block)    Normal echo-2011; stress nuclear in 09/2010--septal hypoperfusion representing nontransmural infarction or the effect of left bundle branch block, no ischemia   Myocardial  infarction [facility]) June 02, 2014   Massive Heart Attack   Peripheral vascular disease (HCC)    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   Thrombocytopenia (HCC)    Tobacco abuse    -100 pack years; 1.5 packs per day   Traumatic seroma of thigh (HCC)    left   Tubular adenoma of colon     ROS:   All systems reviewed and negative except as noted in the HPI.   Past Surgical History:  Procedure Laterality Date   ABDOMINAL AORTAGRAM [name] 05/04/2014   Procedure: ABDOMINAL AORTAGRAM;  Surgeon: [name], MD;  Location: [facility] CATH LAB;  Service: Cardiovascular;  Laterality: [name];   ABDOMINAL AORTOGRAM [name] 06/25/2017   Procedure: Abdominal Aortogram;  Surgeon: [name], MD;  Location: [place] CV LAB;  Service: Cardiovascular;  Laterality: [name];   ABDOMINAL AORTOGRAM  W/LOWER EXTREMITY [name] 04/22/2018   Procedure: ABDOMINAL AORTOGRAM W/LOWER EXTREMITY;  Surgeon: [name], MD;  Location: [place] CV LAB;  Service: Cardiovascular;  Laterality: [name];   ABDOMINAL AORTOGRAM W/LOWER EXTREMITY [name] 03/08/2020   Procedure: ABDOMINAL AORTOGRAM W/LOWER EXTREMITY;  Surgeon: [name], MD;  Location: [place] CV LAB;  Service: Cardiovascular;  Laterality: [name];   ABDOMINAL AORTOGRAM W/LOWER EXTREMITY Bilateral 08/23/2020   Procedure: ABDOMINAL AORTOGRAM W/LOWER EXTREMITY;  Surgeon: [name], MD;  Location: [place] CV LAB;  Service: Cardiovascular;  Laterality: Bilateral;   ABDOMINAL AORTOGRAM W/LOWER EXTREMITY [name] 10/31/2021   Procedure: ABDOMINAL AORTOGRAM W/LOWER EXTREMITY;  Surgeon: [name], MD;  Location: [place] CV LAB;  Service: Cardiovascular;  Laterality: [name];   ABDOMINAL AORTOGRAM W/LOWER EXTREMITY [name] 12/19/2021   Procedure: ABDOMINAL AORTOGRAM W/LOWER EXTREMITY;  Surgeon: [name], MD;  Location: [place] CV LAB;  Service: Cardiovascular;  Laterality: [name];   AGILE CAPSULE [name] 03/18/2014   Procedure: AGILE CAPSULE;  Surgeon: Danie Binder, MD;  [facility];  Service: Endoscopy;  Laterality: [name];  1:70   APPLICATION OF WOUND VAC Left 09/03/2017   Procedure: APPLICATION OF WOUND VAC;  Surgeon: Conrad Boydton, MD;  Location: [place];  Service: Vascular;  Laterality: Left;   APPLICATION OF WOUND VAC Right 04/07/2020   Procedure: PLACEMENT OF ANTIBIOTIC BEADS AND APPLICATION OF WOUND VAC;  Surgeon: [name], MD;  [facility];  Service: Vascular;  Laterality: Right;   BACK SURGERY     BACTERIAL OVERGROWTH TEST [name] 05/24/2015   Procedure: BACTERIAL OVERGROWTH TEST;  Surgeon: Danie Binder, MD;  [facility];  Service: Endoscopy;  Laterality: [name];  0700   BI-VENTRICULAR IMPLANTABLE CARDIOVERTER DEFIBRILLATOR  (CRT-D)  08/19/2015   CARPAL TUNNEL RELEASE Left 02/02/2016   Procedure: LEFT CARPAL  TUNNEL RELEASE;  Surgeon: Daryll Brod, MD;  Location: [place];  Service: Orthopedics;  Laterality: Left;  ANESTHESIA: IV REGIONAL UPPER ARM   CATARACT EXTRACTION W/PHACO Right 03/08/2015   Procedure: CATARACT EXTRACTION PHACO AND INTRAOCULAR LENS PLACEMENT (IOC);  Surgeon: Rutherford Guys, MD;  Location: [facility];  Service: Ophthalmology;  Laterality: Right;  CDE:9.46   CATARACT EXTRACTION W/PHACO Left 03/22/2015   Procedure: CATARACT EXTRACTION PHACO AND INTRAOCULAR LENS PLACEMENT (IOC);  Surgeon: Rutherford Guys, MD;  [facility];  Service: Ophthalmology;  Laterality: Left;  CDE:5.80   COLONOSCOPY  08/22/09   Fields-(Tubular Adenoma)3-mm transverse polyp/4-mm polyp otherwise noraml/small internal hemorrhoids   COLONOSCOPY [name] 04/14/2014   YFV:CBSWH internal hemorrhids/normal mocsa in the terminal iluem/left colonis redundant   COLONOSCOPY  W/ POLYPECTOMY  2011   ENDARTERECTOMY FEMORAL Left 08/16/2017   Procedure: ENDARTERECTOMY LEFT PROFUNDA FEMORAL;  Surgeon: [name], MD;  Location: [facility] OR;  Service: Vascular;  Laterality: Left;   ENDARTERECTOMY FEMORAL Right 03/31/2020   Procedure: RIGHT FEMORAL ENDARTERECTOMY;  Surgeon: [name], MD;  Location: [place];  Service: Vascular;  Laterality: Right;   EP IMPLANTABLE DEVICE [name] 08/19/2015   Procedure: BiV ICD Insertion CRT-D;  Surgeon: Evans Lance, MD;  Location: [place] CV LAB;  Service: Cardiovascular;  Laterality: [name];   ESOPHAGOGASTRODUODENOSCOPY [name] 03/05/2014   SLF: 1. Stricture at the gastroesophagael junction 2. Small hiatal hernia 3. Moderate non-erosive gastritis and duodentitis. 4. No surce for Melena identified.    FEMORAL-POPLITEAL BYPASS GRAFT Left 08/16/2017   Procedure: LEFT  FEMORAL-POPLITEAL ARTERY  BYPASS GRAFT;  Surgeon: [name], MD;  Location: [place];  Service: Vascular;  Laterality: Left;   GIVENS CAPSULE STUDY [name] 03/30/2014   Procedure: GIVENS CAPSULE STUDY;  Surgeon: Danie Binder, MD;   [facility];  Service: Endoscopy;  Laterality: [name];  7:30   GROIN DEBRIDEMENT Right 04/07/2020   Procedure: INCISION AND DRAINAGE OF RIGHT GROIN;  Surgeon: [name], MD;  [facility];  Service: Vascular;  Laterality: Right;   I & D EXTREMITY Left 09/03/2017   Procedure: IRRIGATION AND DEBRIDEMENT EXTREMITY LEFT THIGH SEROMA;  Surgeon: Conrad Chistochina, MD;  Location: [place];  Service: Vascular;  Laterality: Left;   INSERT / REPLACE / [place]  03/31/2020   Procedure: INSERTION OF RIGHT ILIAC ARTERY STENT AND RIGHT SUPERFICIAL FEMORAL ARTERY STENT;  Surgeon: [name], MD;  [facility];  Service: Vascular;;   IR ANGIO INTRA EXTRACRAN SEL COM CAROTID INNOMINATE BILAT MOD SED  07/16/2017   IR ANGIO INTRA EXTRACRAN SEL COM CAROTID INNOMINATE BILAT MOD SED  06/12/2019   IR ANGIO INTRA EXTRACRAN SEL COM CAROTID INNOMINATE BILAT MOD SED  01/17/2021   IR ANGIO VERTEBRAL SEL SUBCLAVIAN INNOMINATE BILAT MOD SED  07/16/2017   IR ANGIO VERTEBRAL SEL SUBCLAVIAN INNOMINATE BILAT MOD SED  06/12/2019   IR ANGIO VERTEBRAL SEL SUBCLAVIAN INNOMINATE BILAT MOD SED  01/17/2021   IR RADIOLOGIST EVAL & MGMT  04/01/2017   IR RADIOLOGIST EVAL & MGMT  08/02/2017   IR TRANSCATH EXCRAN VERT OR CAR A STENT  07/22/2019   IR US GUIDE VASC ACCESS RIGHT  06/12/2019   IR US GUIDE VASC ACCESS RIGHT  01/17/2021   JOINT REPLACEMENT Right    Total Shoulder Replacement    LOWER EXTREMITY ANGIOGRAPHY Bilateral 06/25/2017   Procedure: Lower Extremity Angiography;  Surgeon: [name], MD;  Location: [place] CV LAB;  Service: Cardiovascular;  Laterality: Bilateral;   LUMBAR FUSION  2010   PATCH ANGIOPLASTY Right 03/31/2020   Procedure: PATCH ANGIOPLASTY USING XENOSURE BIOLOGIC PATCH 1cm x 14cm;  Surgeon: [name], MD;  Location: [facility] OR;  Service: Vascular;  Laterality: Right;   PERIPHERAL VASCULAR ATHERECTOMY Right 04/22/2018   Procedure: PERIPHERAL VASCULAR  ATHERECTOMY;  Surgeon: [name], MD;  Location: [place] CV LAB;  Service: Cardiovascular;  Laterality: Right;  right superficial femoral   PERIPHERAL VASCULAR BALLOON ANGIOPLASTY Right 04/22/2018   Procedure: PERIPHERAL VASCULAR BALLOON ANGIOPLASTY;  Surgeon: [name], MD;  Location: [place] CV LAB;  Service: Cardiovascular;  Laterality: Right;  external iliac   PERIPHERAL VASCULAR BALLOON ANGIOPLASTY Left 03/08/2020  Procedure: PERIPHERAL VASCULAR BALLOON ANGIOPLASTY;  Surgeon: [name], MD;  Location: [place] CV LAB;  Service: Cardiovascular;  Laterality: Left;  Common Femoral   PERIPHERAL VASCULAR BALLOON ANGIOPLASTY Left 08/23/2020   Procedure: PERIPHERAL VASCULAR BALLOON ANGIOPLASTY;  Surgeon: [name], MD;  Location: [place] CV LAB;  Service: Cardiovascular;  Laterality: Left;  common femoral   PERIPHERAL VASCULAR BALLOON ANGIOPLASTY Left 10/31/2021   Procedure: PERIPHERAL VASCULAR BALLOON ANGIOPLASTY;  Surgeon: [name], MD;  Location: [place] CV LAB;  Service: Cardiovascular;  Laterality: Left;  external iliac   PERIPHERAL VASCULAR INTERVENTION Right 03/08/2020   Procedure: PERIPHERAL VASCULAR INTERVENTION;  Surgeon: [name], MD;  Location: [place] CV LAB;  Service: Cardiovascular;  Laterality: Right;  SFA   PERIPHERAL VASCULAR INTERVENTION Right 08/23/2020   Procedure: PERIPHERAL VASCULAR INTERVENTION;  Surgeon: [name], MD;  Location: [place] CV LAB;  Service: Cardiovascular;  Laterality: Right;  external iliac   PERIPHERAL VASCULAR INTERVENTION Right 10/31/2021   Procedure: PERIPHERAL VASCULAR INTERVENTION;  Surgeon: [name], MD;  Location: [place] CV LAB;  Service: Cardiovascular;  Laterality: Right;  superficial femoral artery   PERIPHERAL VASCULAR INTERVENTION Left 12/19/2021   Procedure: PERIPHERAL VASCULAR INTERVENTION;  Surgeon: [name], MD;  Location: [place] CV LAB;  Service:  Cardiovascular;  Laterality: Left;  stent lt ext iliac / pta common femoral   RADIOLOGY WITH ANESTHESIA [name] 07/01/2019   Procedure: STENTING;  Surgeon: Luanne Bras, MD;  Location: [place];  Service: Radiology;  Laterality: [name];   RADIOLOGY WITH ANESTHESIA [name] 07/22/2019   Procedure: STENTING;  Surgeon: Luanne Bras, MD;  Location: [place];  Service: Radiology;  Laterality: [name];   TOTAL SHOULDER ARTHROPLASTY Right 2011   Dr. Rafael Bihari NERVE TRANSPOSITION Left 02/02/2016   Procedure: LEFT IN-SITU DECOMPRESSION ULNAR NERVE ;  Surgeon: Daryll Brod, MD;  Location: [place];  Service: Orthopedics;  Laterality: Left;   ULNAR TUNNEL RELEASE Left 02/02/2016   Procedure: LEFT CUBITAL TUNNEL RELEASE;  Surgeon: Daryll Brod, MD;  Location: [place];  Service: Orthopedics;  Laterality: Left;   VASECTOMY  1971   VEIN HARVEST Left 08/16/2017   Procedure: USING NON REVERSE LEFT GREATER SAPHENOUS VEIN HARVEST;  Surgeon: [name], MD;  [facility];  Service: Vascular;  Laterality: Left;     Family History  Problem Relation Age of Onset   Coronary artery disease Mother    Diabetes Mother    Heart disease Mother        Before age 49 - 82 Bypasses   Hypertension Mother    Heart attack Mother        3-4 Heart attacks   Alzheimer's disease Father    Diabetes Father    Heart attack Sister    Cancer Brother        Prostate   Hyperlipidemia Son    Prostate cancer Brother    Alzheimer's disease Sister    Colon cancer Neg Hx      Social History   Socioeconomic History   Marital status: Married    Spouse name: Not on file   Number of children: 2   Years of education: Not on file   Highest education level: Not on file  Occupational History   Occupation: retired, Clinical biochemist    Comment: electrician    Employer: RETIRED  Tobacco Use   Smoking status: Every Day    Packs/day: 1.00  Years: 60.00    Total pack years: 60.00    Types: Cigarettes     Start date: 08/20/1957   Smokeless tobacco: Never  Vaping Use   Vaping Use: Former   Start date: 09/10/2016   Quit date: 10/11/2016  Substance and Sexual Activity   Alcohol use: No    Alcohol/week: 0.0 standard drinks of alcohol    Comment: quit in July 2015   Drug use: No   Sexual activity: Not Currently  Other Topics Concern   Not on file  Social History Narrative   Accompanied by daughter Roy [name]   Lives w/ wife, daughter   Social Determinants of Health   Financial Resource Strain: Not on file  Food Insecurity: Not on file  Transportation Needs: Not on file  Physical Activity: Not on file  Stress: Not on file  Social Connections: Not on file  Intimate Partner Violence: Not on file     BP 132/68   Pulse 60   Ht '5\' 3"'$  (1.6 m)   Wt 151 lb 12.8 oz (68.9 kg)   SpO2 97%   BMI 26.89 kg/m   Physical Exam:  Well appearing NAD HEENT: Unremarkable Neck:  No JVD, no thyromegally Lymphatics:  No adenopathy Back:  No CVA tenderness Lungs:  Clear with no wheezes HEART:  Regular rate rhythm, no murmurs, no rubs, no clicks Abd:  soft, positive bowel sounds, no organomegally, no rebound, no guarding Ext:  2 plus pulses, no edema, no cyanosis, no clubbing Skin:  No rashes no nodules Neuro:  CN II through XII intact, motor grossly intact  EKG - AV paced  DEVICE  Normal device function.  See PaceArt for details.   Assess/Plan:  1. ICD lead noise - he has been stable over the past 6 months and I will have him return in12 months for additional followup. He has a magnet that he is instructed to use if he were to receive a shock due to lead noise. 2. Chronic systolic heart failure - his symptoms are class 2. No change. 3. CAD - he has not had any anginal symptoms. 4. Tobacco abuse - He is encouraged to stop smoking. He is still smoking a ppd.    Carleene Overlie Roy Plourde,MD

## 2022-05-23 NOTE — Patient Instructions (Signed)
Medication Instructions:  Your physician recommends that you continue on your current medications as directed. Please refer to the Current Medication list given to you today.   Labwork: None today  Testing/Procedures: None today  Follow-Up: 1 year  Any Other Special Instructions Will Be Listed Below (If Applicable).  If you need a refill on your cardiac medications before your next appointment, please call your pharmacy.  

## 2022-06-07 ENCOUNTER — Telehealth: Payer: Self-pay

## 2022-06-07 NOTE — Telephone Encounter (Signed)
Transmission received and reviewed. Total number of NSO Episodes >250. Patient will need to be brought into clinic for reprogramming.   Successful telephone encounter to patient to schedule device clinic appointment for reprogramming per previous note. Patient is scheduled for Wednesday July 19 at 10:00am in device clinic at [place].

## 2022-06-07 NOTE — Telephone Encounter (Signed)
Merlin alert received. LeadAssuranceT Alert (SecureSenseT is On, and it detected non-sustained RV oversensing). LeadAssuranceT Alert (V. Noise Reversion).   Discussed with Industry. Suggested turning on NS RV Oversensing to ON  Successful telephone encounter to patient to advise and scheduled a device clinic appointment to reprogram. Patient request to send manual transmission later today to see if there is a continued need. Will review transmission and call back to scheduled a device clinic appointment if needed.

## 2022-06-13 ENCOUNTER — Ambulatory Visit: Payer: Medicare Other

## 2022-06-13 DIAGNOSIS — I255 Ischemic cardiomyopathy: Secondary | ICD-10-CM

## 2022-06-13 NOTE — Progress Notes (Signed)
In clinic check to turn non-sustained RV oversensing episodes triggers on, Non-sustained RV oversensing trigger turned on to Low priority.

## 2022-06-21 ENCOUNTER — Ambulatory Visit: Payer: Self-pay | Admitting: *Deleted

## 2022-06-21 ENCOUNTER — Encounter: Payer: Self-pay | Admitting: *Deleted

## 2022-06-21 NOTE — Patient Instructions (Signed)
Visit Information  Thank you for taking time to visit with me today. Please don't hesitate to contact me if I can be of assistance to you.   Please call the care guide team at [phone] if you need to cancel or reschedule your appointment.   If you are experiencing a Mental Health or Behavioral Health Crisis or need someone to talk to, please call the Suicide and Crisis Lifeline: 988 call the [place] National Suicide Prevention Lifeline: 1-800-273-8255 or TTY: 1-800-799-4 TTY (1-800-799-4889) to talk to a trained counselor call 1-800-273-TALK (toll free, 24 hour hotline) go to [facility] [address], [place] ([phone]) call the [place] Crisis Line: 800-939-9988 call 911  Patient verbalizes understanding of instructions and care plan provided today and agrees to view in MyChart. Active MyChart status and patient understanding of how to access instructions and care plan via MyChart confirmed with patient.     No further follow up required.  Dezaree Tracey, BSW, MSW, LCSW  Licensed Clinical Social Worker  [facility] [place] [facility]  Mailing Address-[address], [place], [place] [address] Physical Address-[address], [place], [place] [address] Toll Free Main # [phone] Fax # [phone] Cell # [phone] [email or web address]            

## 2022-06-21 NOTE — Patient Outreach (Signed)
  Care Coordination   Initial Visit Note   06/21/2022 Name: Roy [name] MRN: [id number] DOB: 09/13/1946  Roy [name] is a 76 y.o. year old male who sees [place] Crane, Edwinna Areola, MD for primary care. I spoke with  Roy [name] by phone today.  What matters to the patients health and wellness today?  No Interventions Indicated.  SDOH assessments and interventions completed:   Yes SDOH Interventions Today    Flowsheet Row Most Recent Value  SDOH Interventions   Food Insecurity Interventions Intervention Not Indicated  Financial Strain Interventions Intervention Not Indicated  Housing Interventions Intervention Not Indicated  Physical Activity Interventions Patient Refused  Stress Interventions Intervention Not Indicated  Social Connections Interventions Intervention Not Indicated  Transportation Interventions Intervention Not Indicated       Care Coordination Interventions Activated:  No  Care Coordination Interventions:  No, not indicated.  Follow up plan: No further intervention required.  Encounter Outcome:  Pt. Visit Completed.  Nat Christen, BSW, MSW, LCSW  Licensed Education officer, [facility]  Mailing [place] [address] [place], [place] [address] Physical Address-[address], [place], [place] [address] Toll Free Main # [phone] Fax # [phone] Cell # [phone] [email or web address]

## 2022-07-04 ENCOUNTER — Ambulatory Visit (INDEPENDENT_AMBULATORY_CARE_PROVIDER_SITE_OTHER): Payer: Medicare Other

## 2022-07-04 DIAGNOSIS — I255 Ischemic cardiomyopathy: Secondary | ICD-10-CM

## 2022-07-04 LAB — CUP PACEART REMOTE DEVICE CHECK
Battery Remaining Longevity: 18 mo
Battery Remaining Percentage: 24 %
Battery Voltage: 2.8 V
Brady Statistic AP VP Percent: 39 %
Brady Statistic AP VS Percent: 1 %
Brady Statistic AS VP Percent: 59 %
Brady Statistic AS VS Percent: 1 %
Brady Statistic RA Percent Paced: 39 %
Date Time Interrogation Session: 20230809020016
HighPow Impedance: 69 Ohm
HighPow Impedance: 69 Ohm
Implantable Lead Implant Date: 20160923
Implantable Lead Implant Date: 20160923
Implantable Lead Implant Date: 20160923
Implantable Lead Location: 753858
Implantable Lead Location: 753859
Implantable Lead Location: 753860
Implantable Lead Model: 7122
Implantable Pulse Generator Implant Date: 20160923
Lead Channel Impedance Value: 390 Ohm
Lead Channel Impedance Value: 460 Ohm
Lead Channel Impedance Value: 740 Ohm
Lead Channel Pacing Threshold Amplitude: 0.75 V
Lead Channel Pacing Threshold Amplitude: 0.75 V
Lead Channel Pacing Threshold Amplitude: 1.5 V
Lead Channel Pacing Threshold Pulse Width: 0.5 ms
Lead Channel Pacing Threshold Pulse Width: 0.5 ms
Lead Channel Pacing Threshold Pulse Width: 0.8 ms
Lead Channel Sensing Intrinsic Amplitude: 12 mV
Lead Channel Sensing Intrinsic Amplitude: 3.9 mV
Lead Channel Setting Pacing Amplitude: 2 V
Lead Channel Setting Pacing Amplitude: 2 V
Lead Channel Setting Pacing Amplitude: 2.5 V
Lead Channel Setting Pacing Pulse Width: 0.5 ms
Lead Channel Setting Pacing Pulse Width: 0.8 ms
Lead Channel Setting Sensing Sensitivity: 0.5 mV
Pulse Gen Serial Number: 7294918

## 2022-08-01 NOTE — Progress Notes (Signed)
Remote ICD transmission.   

## 2022-08-24 DIAGNOSIS — R7303 Prediabetes: Secondary | ICD-10-CM | POA: Diagnosis not present

## 2022-08-24 DIAGNOSIS — E782 Mixed hyperlipidemia: Secondary | ICD-10-CM | POA: Diagnosis not present

## 2022-08-29 DIAGNOSIS — F17200 Nicotine dependence, unspecified, uncomplicated: Secondary | ICD-10-CM | POA: Diagnosis not present

## 2022-08-29 DIAGNOSIS — E782 Mixed hyperlipidemia: Secondary | ICD-10-CM | POA: Diagnosis not present

## 2022-08-29 DIAGNOSIS — I5022 Chronic systolic (congestive) heart failure: Secondary | ICD-10-CM | POA: Diagnosis not present

## 2022-08-29 DIAGNOSIS — K7460 Unspecified cirrhosis of liver: Secondary | ICD-10-CM | POA: Diagnosis not present

## 2022-08-29 DIAGNOSIS — I10 Essential (primary) hypertension: Secondary | ICD-10-CM | POA: Diagnosis not present

## 2022-08-29 DIAGNOSIS — E875 Hyperkalemia: Secondary | ICD-10-CM | POA: Diagnosis not present

## 2022-08-29 DIAGNOSIS — I671 Cerebral aneurysm, nonruptured: Secondary | ICD-10-CM | POA: Diagnosis not present

## 2022-08-29 DIAGNOSIS — Z0000 Encounter for general adult medical examination without abnormal findings: Secondary | ICD-10-CM | POA: Diagnosis not present

## 2022-08-29 DIAGNOSIS — M545 Low back pain, unspecified: Secondary | ICD-10-CM | POA: Diagnosis not present

## 2022-08-29 DIAGNOSIS — R7303 Prediabetes: Secondary | ICD-10-CM | POA: Diagnosis not present

## 2022-08-29 DIAGNOSIS — D509 Iron deficiency anemia, unspecified: Secondary | ICD-10-CM | POA: Diagnosis not present

## 2022-09-05 DIAGNOSIS — G8929 Other chronic pain: Secondary | ICD-10-CM | POA: Diagnosis not present

## 2022-09-05 DIAGNOSIS — M5451 Vertebrogenic low back pain: Secondary | ICD-10-CM | POA: Diagnosis not present

## 2022-09-05 DIAGNOSIS — M961 Postlaminectomy syndrome, not elsewhere classified: Secondary | ICD-10-CM | POA: Diagnosis not present

## 2022-09-07 ENCOUNTER — Other Ambulatory Visit (HOSPITAL_COMMUNITY): Payer: Self-pay | Admitting: Pain Medicine

## 2022-09-07 ENCOUNTER — Other Ambulatory Visit: Payer: Self-pay | Admitting: Pain Medicine

## 2022-09-07 DIAGNOSIS — M961 Postlaminectomy syndrome, not elsewhere classified: Secondary | ICD-10-CM

## 2022-10-03 ENCOUNTER — Ambulatory Visit (INDEPENDENT_AMBULATORY_CARE_PROVIDER_SITE_OTHER): Payer: Medicare Other

## 2022-10-03 DIAGNOSIS — I255 Ischemic cardiomyopathy: Secondary | ICD-10-CM | POA: Diagnosis not present

## 2022-10-03 LAB — CUP PACEART REMOTE DEVICE CHECK
Battery Remaining Longevity: 16 mo
Battery Remaining Percentage: 20 %
Battery Voltage: 2.77 V
Brady Statistic AP VP Percent: 33 %
Brady Statistic AP VS Percent: 1 %
Brady Statistic AS VP Percent: 64 %
Brady Statistic AS VS Percent: 1 %
Brady Statistic RA Percent Paced: 33 %
Date Time Interrogation Session: 20231108020024
HighPow Impedance: 75 Ohm
HighPow Impedance: 75 Ohm
Implantable Lead Connection Status: 753985
Implantable Lead Connection Status: 753985
Implantable Lead Connection Status: 753985
Implantable Lead Implant Date: 20160923
Implantable Lead Implant Date: 20160923
Implantable Lead Implant Date: 20160923
Implantable Lead Location: 753858
Implantable Lead Location: 753859
Implantable Lead Location: 753860
Implantable Lead Model: 7122
Implantable Pulse Generator Implant Date: 20160923
Lead Channel Impedance Value: 440 Ohm
Lead Channel Impedance Value: 490 Ohm
Lead Channel Impedance Value: 830 Ohm
Lead Channel Pacing Threshold Amplitude: 0.625 V
Lead Channel Pacing Threshold Amplitude: 0.75 V
Lead Channel Pacing Threshold Amplitude: 1.5 V
Lead Channel Pacing Threshold Pulse Width: 0.5 ms
Lead Channel Pacing Threshold Pulse Width: 0.5 ms
Lead Channel Pacing Threshold Pulse Width: 0.8 ms
Lead Channel Sensing Intrinsic Amplitude: 12 mV
Lead Channel Sensing Intrinsic Amplitude: 3.6 mV
Lead Channel Setting Pacing Amplitude: 2 V
Lead Channel Setting Pacing Amplitude: 2 V
Lead Channel Setting Pacing Amplitude: 2.5 V
Lead Channel Setting Pacing Pulse Width: 0.5 ms
Lead Channel Setting Pacing Pulse Width: 0.8 ms
Lead Channel Setting Sensing Sensitivity: 0.5 mV
Pulse Gen Serial Number: 7294918
Zone Setting Status: 755011

## 2022-10-08 ENCOUNTER — Encounter (HOSPITAL_COMMUNITY): Payer: Self-pay | Admitting: Physical Therapy

## 2022-10-08 ENCOUNTER — Ambulatory Visit (HOSPITAL_COMMUNITY): Payer: Medicare Other | Attending: Pain Medicine | Admitting: Physical Therapy

## 2022-10-08 DIAGNOSIS — M545 Low back pain, unspecified: Secondary | ICD-10-CM | POA: Diagnosis not present

## 2022-10-08 DIAGNOSIS — R29898 Other symptoms and signs involving the musculoskeletal system: Secondary | ICD-10-CM | POA: Insufficient documentation
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
## 2022-10-15 ENCOUNTER — Ambulatory Visit (HOSPITAL_COMMUNITY): Payer: Medicare Other | Admitting: Physical Therapy

## 2022-10-15 DIAGNOSIS — M545 Low back pain, unspecified: Secondary | ICD-10-CM

## 2022-10-15 DIAGNOSIS — R29898 Other symptoms and signs involving the musculoskeletal system: Secondary | ICD-10-CM | POA: Diagnosis not present
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
## 2022-10-15 NOTE — Progress Notes (Signed)
Remote ICD transmission.   

## 2022-10-17 ENCOUNTER — Ambulatory Visit (HOSPITAL_COMMUNITY): Payer: Medicare Other | Admitting: Physical Therapy

## 2022-10-17 DIAGNOSIS — M545 Low back pain, unspecified: Secondary | ICD-10-CM | POA: Diagnosis not present

## 2022-10-17 DIAGNOSIS — R29898 Other symptoms and signs involving the musculoskeletal system: Secondary | ICD-10-CM
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
## 2022-10-22 ENCOUNTER — Ambulatory Visit (HOSPITAL_COMMUNITY): Payer: Medicare Other | Admitting: Physical Therapy

## 2022-10-22 ENCOUNTER — Encounter (HOSPITAL_COMMUNITY): Payer: Self-pay | Admitting: Physical Therapy

## 2022-10-22 DIAGNOSIS — M545 Low back pain, unspecified: Secondary | ICD-10-CM | POA: Diagnosis not present

## 2022-10-22 DIAGNOSIS — R29898 Other symptoms and signs involving the musculoskeletal system: Secondary | ICD-10-CM | POA: Diagnosis not present
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
## 2022-10-24 ENCOUNTER — Ambulatory Visit (HOSPITAL_COMMUNITY): Payer: Medicare Other

## 2022-10-24 ENCOUNTER — Encounter (HOSPITAL_COMMUNITY): Payer: Self-pay

## 2022-10-24 DIAGNOSIS — M545 Low back pain, unspecified: Secondary | ICD-10-CM | POA: Diagnosis not present

## 2022-10-24 DIAGNOSIS — R29898 Other symptoms and signs involving the musculoskeletal system: Secondary | ICD-10-CM
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
## 2022-10-29 ENCOUNTER — Ambulatory Visit (HOSPITAL_COMMUNITY): Payer: Medicare Other | Attending: Pain Medicine | Admitting: Physical Therapy

## 2022-10-29 ENCOUNTER — Ambulatory Visit (HOSPITAL_COMMUNITY)
Admission: RE | Admit: 2022-10-29 | Discharge: 2022-10-29 | Disposition: A | Discharge status: Home / Self Care | Payer: Medicare Other | Source: Ambulatory Visit | Attending: Pain Medicine | Admitting: Pain Medicine

## 2022-10-29 DIAGNOSIS — M5136 Other intervertebral disc degeneration, lumbar region: Secondary | ICD-10-CM | POA: Diagnosis not present

## 2022-10-29 DIAGNOSIS — M961 Postlaminectomy syndrome, not elsewhere classified: Secondary | ICD-10-CM | POA: Insufficient documentation

## 2022-10-29 DIAGNOSIS — M5127 Other intervertebral disc displacement, lumbosacral region: Secondary | ICD-10-CM | POA: Diagnosis not present

## 2022-10-29 DIAGNOSIS — R29898 Other symptoms and signs involving the musculoskeletal system: Secondary | ICD-10-CM | POA: Insufficient documentation

## 2022-10-29 DIAGNOSIS — M47817 Spondylosis without myelopathy or radiculopathy, lumbosacral region: Secondary | ICD-10-CM | POA: Diagnosis not present

## 2022-10-29 DIAGNOSIS — M545 Low back pain, unspecified: Secondary | ICD-10-CM | POA: Insufficient documentation

## 2022-10-29 DIAGNOSIS — M5126 Other intervertebral disc displacement, lumbar region: Secondary | ICD-10-CM | POA: Diagnosis not present
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
## 2022-10-31 ENCOUNTER — Ambulatory Visit (HOSPITAL_COMMUNITY): Payer: Medicare Other | Admitting: Physical Therapy

## 2022-10-31 DIAGNOSIS — R29898 Other symptoms and signs involving the musculoskeletal system: Secondary | ICD-10-CM

## 2022-10-31 DIAGNOSIS — M545 Low back pain, unspecified: Secondary | ICD-10-CM | POA: Diagnosis not present
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
## 2022-11-05 ENCOUNTER — Encounter (HOSPITAL_COMMUNITY): Payer: Self-pay | Admitting: Physical Therapy

## 2022-11-05 ENCOUNTER — Ambulatory Visit (HOSPITAL_COMMUNITY): Payer: Medicare Other | Admitting: Physical Therapy

## 2022-11-05 DIAGNOSIS — M545 Low back pain, unspecified: Secondary | ICD-10-CM | POA: Diagnosis not present

## 2022-11-05 DIAGNOSIS — R29898 Other symptoms and signs involving the musculoskeletal system: Secondary | ICD-10-CM | POA: Diagnosis not present
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
## 2022-11-07 ENCOUNTER — Encounter (HOSPITAL_COMMUNITY): Payer: Medicare Other | Admitting: Physical Therapy

## 2022-11-12 DIAGNOSIS — M5451 Vertebrogenic low back pain: Secondary | ICD-10-CM | POA: Diagnosis not present

## 2022-11-12 DIAGNOSIS — G8929 Other chronic pain: Secondary | ICD-10-CM | POA: Diagnosis not present

## 2022-11-12 DIAGNOSIS — Z79891 Long term (current) use of opiate analgesic: Secondary | ICD-10-CM | POA: Diagnosis not present

## 2022-11-12 DIAGNOSIS — M961 Postlaminectomy syndrome, not elsewhere classified: Secondary | ICD-10-CM | POA: Diagnosis not present

## 2022-11-20 ENCOUNTER — Other Ambulatory Visit: Payer: Self-pay | Admitting: Cardiology

## 2022-12-05 DIAGNOSIS — E782 Mixed hyperlipidemia: Secondary | ICD-10-CM | POA: Diagnosis not present

## 2022-12-05 DIAGNOSIS — R7303 Prediabetes: Secondary | ICD-10-CM | POA: Diagnosis not present

## 2022-12-06 ENCOUNTER — Ambulatory Visit: Payer: Medicare Other | Admitting: Nurse Practitioner

## 2022-12-11 ENCOUNTER — Encounter: Payer: Self-pay | Admitting: Nurse Practitioner

## 2022-12-11 ENCOUNTER — Other Ambulatory Visit: Payer: Self-pay | Admitting: Nurse Practitioner

## 2022-12-11 ENCOUNTER — Ambulatory Visit: Payer: Medicare Other | Attending: Pain Medicine | Admitting: Nurse Practitioner

## 2022-12-11 VITALS — BP 160/85 | HR 62 | Ht 63.0 in | Wt 152.4 lb

## 2022-12-11 DIAGNOSIS — I255 Ischemic cardiomyopathy: Secondary | ICD-10-CM

## 2022-12-11 DIAGNOSIS — I10 Essential (primary) hypertension: Secondary | ICD-10-CM

## 2022-12-11 DIAGNOSIS — I739 Peripheral vascular disease, unspecified: Secondary | ICD-10-CM | POA: Diagnosis not present

## 2022-12-11 DIAGNOSIS — Z95810 Presence of automatic (implantable) cardiac defibrillator: Secondary | ICD-10-CM

## 2022-12-11 DIAGNOSIS — I2510 Atherosclerotic heart disease of native coronary artery without angina pectoris: Secondary | ICD-10-CM | POA: Diagnosis not present

## 2022-12-11 DIAGNOSIS — E785 Hyperlipidemia, unspecified: Secondary | ICD-10-CM | POA: Diagnosis not present

## 2022-12-11 DIAGNOSIS — I5022 Chronic systolic (congestive) heart failure: Secondary | ICD-10-CM | POA: Diagnosis not present

## 2022-12-11 DIAGNOSIS — Z720 Tobacco use: Secondary | ICD-10-CM | POA: Diagnosis not present

## 2022-12-11 MED ORDER — ENTRESTO 97-103 MG PO TABS: 1.0000 | ORAL_TABLET | Freq: Two times a day (BID) | ORAL | 3 refills | Status: DC

## 2022-12-11 MED ORDER — CARVEDILOL 6.25 MG PO TABS: 6.2500 mg | ORAL_TABLET | Freq: Two times a day (BID) | ORAL | 0 refills | Status: DC

## 2022-12-11 MED ORDER — CARVEDILOL 6.25 MG PO TABS: 6.2500 mg | ORAL_TABLET | Freq: Two times a day (BID) | ORAL | 3 refills | Status: DC

## 2022-12-11 NOTE — Patient Instructions (Addendum)
Medication Instructions:   INCREASE Carvedilol (Coreg) to 6.25 mg 2 times a day   *If you need a refill on your cardiac medications before your next appointment, please call your pharmacy*  Lab Work: NONE ordered at this time of appointment   If you have labs (blood work) drawn today and your tests are completely normal, you will receive your results only by: [place] (if you have MyChart) OR A paper copy in the mail If you have any lab test that is abnormal or we need to change your treatment, we will call you to review the results.  Testing/Procedures: NONE ordered at this time of appointment   Follow-Up: At [facility], you and your health needs are our priority.  As part of our continuing mission to provide you with exceptional heart care, we have created designated Provider Care Teams.  These Care Teams include your primary Cardiologist (physician) and Advanced Practice Providers (APPs -  Physician Assistants and Nurse Practitioners) who all work together to provide you with the care you need, when you need it.   Your next appointment:   2 month(s)  Provider:   Finis Bud, NP   Other Instructions

## 2022-12-11 NOTE — Progress Notes (Signed)
Cardiology Office Note:    Date:  12/11/2022  ID:  Roy [name], DOB 25-Mar-1946, MRN [id number]  PCP:  Celene Squibb, [place] Prov[facility] Cardiologist:  Carlyle Dolly, MD Electrophysiologist:  Cristopher Peru, MD     Referring MD: Celene Squibb, MD   CC: Here for follow-up  History of Present Illness:    Roy [name] is a 77 y.o. male with a hx of the following:  CAD, status post NSTEMI PAD Chronic systolic heart failure, status post BiV ICD Hyperlipidemia Hypertension Cerebrovascular disease, status post history of ICA stent  History of NSTEMI 2015 in the setting of respiratory failure and pneumonia.  EF at that time was found to be 20%.  Cardiac catheterization was not pursued due to renal dysfunction and altered mental status.  Subsequent stress imaging supported scar.  Has been on DAPT for history of ICA stenting due to cerebrovascular disease.  In 2018 his EF improved to 50 to 55/% after ICD was placed, no wall motion abnormalities.  Patient is a very pleasant 77 year old male with past medical history as mentioned above.  Last seen by Dr. Carlyle Dolly on October 24, 2021.  He denied any chest pain at that time.  Overall was doing very well.  Was followed by vascular for history of PAD.  Entresto was increased to 97/103 mg twice daily and an BMET was ordered in 2 weeks.  Was told to follow-up in 6 months.  Today he presents for overdue 57-monthfollow-up.  He states he is doing well from a cardiac perspective. Denies any chest pain, shortness of breath, palpitations, syncope, presyncope, dizziness, orthopnea, PND, swelling or significant weight changes, acute bleeding, or claudication. Smokes 1 PPD, has been smoking since he was 77years old. He is an A[employer]ist, research (life sciences) He is requesting a refill on Entresto. Denies any other questions or concerns today.    Past Medical History:  Diagnosis Date   AICD (automatic cardioverter/defibrillator) present 08/19/2015    St Jude BiV ICD for primary prevention by Dr. TLovena Le  Alcohol abuse    6 beers per day; hospital admission in 2009 for withdrawal symptoms   Alcoholic cirrhosis ([place]    Anxiety and depression    denies    Cerebrovascular disease 2009   TIA; 2009- right ICA stent; [facility]   CHF (congestive heart failure) [place] 06-02-14   Chronic obstructive pulmonary disease (HCC)    Degenerative joint disease    Total shoulder arthroplasty-right   Hyperlipidemia    Hypertension    LBBB (left bundle branch block)    Normal echo-2011; stress nuclear in 09/2010--septal hypoperfusion representing nontransmural infarction or the effect of left bundle branch block, no ischemia   Myocardial infarction ([facility] June 02, 2014   Massive Heart Attack   Peripheral vascular disease ([facility]    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   Thrombocytopenia (HCC)    Tobacco abuse    -100 pack years; 1.5 packs per day   Traumatic seroma of thigh (HCC)    left   Tubular adenoma of colon     Past Surgical History:  Procedure Laterality Date   ABDOMINAL AORTAGRAM [name] 05/04/2014   Procedure: ABDOMINAL AMaxcine Ham  Surgeon: VSerafina Mitchell MD;  [facility];  Service: Cardiovascular;  Laterality: [name];   ABDOMINAL AORTOGRAM [name] 06/25/2017   Procedure: Abdominal Aortogram;  Surgeon: BSerafina Mitchell MD;  [facility];  Service: Cardiovascular;  Laterality: [name];   ABDOMINAL AORTOGRAM W/LOWER EXTREMITY [name] 04/22/2018   Procedure: ABDOMINAL AORTOGRAM W/LOWER EXTREMITY;  Surgeon: [name], MD;  Location: [place] CV LAB;  Service: Cardiovascular;  Laterality: [name];   ABDOMINAL AORTOGRAM W/LOWER EXTREMITY [name] 03/08/2020   Procedure: ABDOMINAL AORTOGRAM W/LOWER EXTREMITY;  Surgeon: [name], MD;  Location: [place] CV LAB;  Service: Cardiovascular;  Laterality: [name];   ABDOMINAL AORTOGRAM W/LOWER EXTREMITY Bilateral 08/23/2020    Procedure: ABDOMINAL AORTOGRAM W/LOWER EXTREMITY;  Surgeon: [name], MD;  Location: [place] CV LAB;  Service: Cardiovascular;  Laterality: Bilateral;   ABDOMINAL AORTOGRAM W/LOWER EXTREMITY [name] 10/31/2021   Procedure: ABDOMINAL AORTOGRAM W/LOWER EXTREMITY;  Surgeon: [name], MD;  Location: [place] CV LAB;  Service: Cardiovascular;  Laterality: [name];   ABDOMINAL AORTOGRAM W/LOWER EXTREMITY [name] 12/19/2021   Procedure: ABDOMINAL AORTOGRAM W/LOWER EXTREMITY;  Surgeon: [name], MD;  Location: [place] CV LAB;  Service: Cardiovascular;  Laterality: [name];   AGILE CAPSULE [name] 03/18/2014   Procedure: AGILE CAPSULE;  Surgeon: Danie Binder, MD;  [facility];  Service: Endoscopy;  Laterality: [name];  2:37   APPLICATION OF WOUND VAC Left 09/03/2017   Procedure: APPLICATION OF WOUND VAC;  Surgeon: Conrad Trion, MD;  Location: [place];  Service: Vascular;  Laterality: Left;   APPLICATION OF WOUND VAC Right 04/07/2020   Procedure: PLACEMENT OF ANTIBIOTIC BEADS AND APPLICATION OF WOUND VAC;  Surgeon: [name], MD;  [facility];  Service: Vascular;  Laterality: Right;   BACK SURGERY     BACTERIAL OVERGROWTH TEST [name] 05/24/2015   Procedure: BACTERIAL OVERGROWTH TEST;  Surgeon: Danie Binder, MD;  [facility];  Service: Endoscopy;  Laterality: [name];  0700   BI-VENTRICULAR IMPLANTABLE CARDIOVERTER DEFIBRILLATOR  (CRT-D)  08/19/2015   CARPAL TUNNEL RELEASE Left 02/02/2016   Procedure: LEFT CARPAL TUNNEL RELEASE;  Surgeon: Daryll Brod, MD;  Location: [place];  Service: Orthopedics;  Laterality: Left;  ANESTHESIA: IV REGIONAL UPPER ARM   CATARACT EXTRACTION W/PHACO Right 03/08/2015   Procedure: CATARACT EXTRACTION PHACO AND INTRAOCULAR LENS PLACEMENT (IOC);  Surgeon: Rutherford Guys, MD;  Location: [facility];  Service: Ophthalmology;  Laterality: Right;  CDE:9.46   CATARACT EXTRACTION W/PHACO Left 03/22/2015   Procedure: CATARACT EXTRACTION PHACO AND  INTRAOCULAR LENS PLACEMENT (IOC);  Surgeon: Rutherford Guys, MD;  Location: [facility];  Service: Ophthalmology;  Laterality: Left;  CDE:5.80   COLONOSCOPY  08/22/09   Fields-(Tubular Adenoma)3-mm transverse polyp/4-mm polyp otherwise noraml/small internal hemorrhoids   COLONOSCOPY [name] 04/14/2014   SEG:BTDVV internal hemorrhids/normal mocsa in the terminal iluem/left colonis redundant   COLONOSCOPY W/ POLYPECTOMY  2011   ENDARTERECTOMY FEMORAL Left 08/16/2017   Procedure: ENDARTERECTOMY LEFT PROFUNDA FEMORAL;  Surgeon: [name], MD;  Location: [facility] OR;  Service: Vascular;  Laterality: Left;   ENDARTERECTOMY FEMORAL Right 03/31/2020   Procedure: RIGHT FEMORAL ENDARTERECTOMY;  Surgeon: [name], MD;  Location: [place];  Service: Vascular;  Laterality: Right;   EP IMPLANTABLE DEVICE [name] 08/19/2015   Procedure: BiV ICD Insertion CRT-D;  Surgeon: Evans Lance, MD;  Location: [place] CV LAB;  Service: Cardiovascular;  Laterality: [name];   ESOPHAGOGASTRODUODENOSCOPY [name] 03/05/2014   SLF: 1. Stricture at the gastroesophagael junction 2. Small hiatal hernia 3. Moderate non-erosive gastritis and duodentitis. 4. No surce for Melena identified.    FEMORAL-POPLITEAL BYPASS GRAFT Left 08/16/2017   Procedure: LEFT  FEMORAL-POPLITEAL ARTERY  BYPASS  GRAFT;  Surgeon: [name], MD;  Location: [place];  Service: Vascular;  Laterality: Left;   GIVENS CAPSULE STUDY [name] 03/30/2014   Procedure: GIVENS CAPSULE STUDY;  Surgeon: Danie Binder, MD;  [facility];  Service: Endoscopy;  Laterality: [name];  7:30   GROIN DEBRIDEMENT Right 04/07/2020   Procedure: INCISION AND DRAINAGE OF RIGHT GROIN;  Surgeon: [name], MD;  [facility];  Service: Vascular;  Laterality: Right;   I & D EXTREMITY Left 09/03/2017   Procedure: IRRIGATION AND DEBRIDEMENT EXTREMITY LEFT THIGH SEROMA;  Surgeon: Conrad Thiensville, MD;  Location: [place];  Service: Vascular;  Laterality: Left;   INSERT / REPLACE / [employer] [facility] [employer]  03/31/2020   Procedure: INSERTION OF RIGHT ILIAC ARTERY STENT AND RIGHT SUPERFICIAL FEMORAL ARTERY STENT;  Surgeon: [name], MD;  [facility];  Service: Vascular;;   IR ANGIO INTRA EXTRACRAN SEL COM CAROTID INNOMINATE BILAT MOD SED  07/16/2017   IR ANGIO INTRA EXTRACRAN SEL COM CAROTID INNOMINATE BILAT MOD SED  06/12/2019   IR ANGIO INTRA EXTRACRAN SEL COM CAROTID INNOMINATE BILAT MOD SED  01/17/2021   IR ANGIO VERTEBRAL SEL SUBCLAVIAN INNOMINATE BILAT MOD SED  07/16/2017   IR ANGIO VERTEBRAL SEL SUBCLAVIAN INNOMINATE BILAT MOD SED  06/12/2019   IR ANGIO VERTEBRAL SEL SUBCLAVIAN INNOMINATE BILAT MOD SED  01/17/2021   IR RADIOLOGIST EVAL & MGMT  04/01/2017   IR RADIOLOGIST EVAL & MGMT  08/02/2017   IR TRANSCATH EXCRAN VERT OR CAR A STENT  07/22/2019   IR US GUIDE VASC ACCESS RIGHT  06/12/2019   IR US GUIDE VASC ACCESS RIGHT  01/17/2021   JOINT REPLACEMENT Right    Total Shoulder Replacement    LOWER EXTREMITY ANGIOGRAPHY Bilateral 06/25/2017   Procedure: Lower Extremity Angiography;  Surgeon: [name], MD;  Location: [place] CV LAB;  Service: Cardiovascular;  Laterality: Bilateral;   LUMBAR FUSION  2010   PATCH ANGIOPLASTY Right 03/31/2020   Procedure: PATCH ANGIOPLASTY USING XENOSURE BIOLOGIC PATCH 1cm x 14cm;  Surgeon: [name], MD;  [facility];  Service: Vascular;  Laterality: Right;   PERIPHERAL VASCULAR ATHERECTOMY Right 04/22/2018   Procedure: PERIPHERAL VASCULAR ATHERECTOMY;  Surgeon: [name], MD;  Location: [place] CV LAB;  Service: Cardiovascular;  Laterality: Right;  right superficial femoral   PERIPHERAL VASCULAR BALLOON ANGIOPLASTY Right 04/22/2018   Procedure: PERIPHERAL VASCULAR BALLOON ANGIOPLASTY;  Surgeon: [name], MD;  Location: [place] CV LAB;  Service: Cardiovascular;  Laterality: Right;  external iliac   PERIPHERAL VASCULAR BALLOON ANGIOPLASTY Left 03/08/2020   Procedure: PERIPHERAL VASCULAR BALLOON  ANGIOPLASTY;  Surgeon: [name], MD;  Location: [place] CV LAB;  Service: Cardiovascular;  Laterality: Left;  Common Femoral   PERIPHERAL VASCULAR BALLOON ANGIOPLASTY Left 08/23/2020   Procedure: PERIPHERAL VASCULAR BALLOON ANGIOPLASTY;  Surgeon: [name], MD;  Location: [place] CV LAB;  Service: Cardiovascular;  Laterality: Left;  common femoral   PERIPHERAL VASCULAR BALLOON ANGIOPLASTY Left 10/31/2021   Procedure: PERIPHERAL VASCULAR BALLOON ANGIOPLASTY;  Surgeon: [name], MD;  Location: [place] CV LAB;  Service: Cardiovascular;  Laterality: Left;  external iliac   PERIPHERAL VASCULAR INTERVENTION Right 03/08/2020   Procedure: PERIPHERAL VASCULAR INTERVENTION;  Surgeon: [name], MD;  Location: [place] CV LAB;  Service: Cardiovascular;  Laterality: Right;  SFA   PERIPHERAL VASCULAR INTERVENTION Right 08/23/2020  Procedure: PERIPHERAL VASCULAR INTERVENTION;  Surgeon: [name], MD;  Location: [place] CV LAB;  Service: Cardiovascular;  Laterality: Right;  external iliac   PERIPHERAL VASCULAR INTERVENTION Right 10/31/2021   Procedure: PERIPHERAL VASCULAR INTERVENTION;  Surgeon: [name], MD;  Location: [place] CV LAB;  Service: Cardiovascular;  Laterality: Right;  superficial femoral artery   PERIPHERAL VASCULAR INTERVENTION Left 12/19/2021   Procedure: PERIPHERAL VASCULAR INTERVENTION;  Surgeon: [name], MD;  Location: [place] CV LAB;  Service: Cardiovascular;  Laterality: Left;  stent lt ext iliac / pta common femoral   RADIOLOGY WITH ANESTHESIA [name] 07/01/2019   Procedure: STENTING;  Surgeon: Luanne Bras, MD;  Location: [place];  Service: Radiology;  Laterality: [name];   RADIOLOGY WITH ANESTHESIA [name] 07/22/2019   Procedure: STENTING;  Surgeon: Luanne Bras, MD;  Location: [place];  Service: Radiology;  Laterality: [name];   TOTAL SHOULDER ARTHROPLASTY Right 2011   Dr. Rafael Bihari NERVE TRANSPOSITION Left  02/02/2016   Procedure: LEFT IN-SITU DECOMPRESSION ULNAR NERVE ;  Surgeon: Daryll Brod, MD;  Location: [place];  Service: Orthopedics;  Laterality: Left;   ULNAR TUNNEL RELEASE Left 02/02/2016   Procedure: LEFT CUBITAL TUNNEL RELEASE;  Surgeon: Daryll Brod, MD;  Location: [place];  Service: Orthopedics;  Laterality: Left;   VASECTOMY  1971   VEIN HARVEST Left 08/16/2017   Procedure: USING NON REVERSE LEFT GREATER SAPHENOUS VEIN HARVEST;  Surgeon: [name], MD;  [facility];  Service: Vascular;  Laterality: Left;    Current Medications: Current Meds  Medication Sig   acetaminophen (TYLENOL) 500 MG tablet Take 1,000 mg by mouth every 8 (eight) hours as needed for moderate pain or headache.   aspirin EC 81 MG tablet Take 81 mg by mouth every evening.    atorvastatin (LIPITOR) 20 MG tablet Take 20 mg by mouth every evening.   BRILINTA 90 MG TABS tablet Take 90 mg by mouth 2 (two) times daily.   cetirizine (ZYRTEC) 10 MG tablet Take 10 mg by mouth daily.   dextromethorphan-guaiFENesin (MUCINEX DM) 30-600 MG 12hr tablet Take 1 tablet by mouth daily.    gabapentin (NEURONTIN) 300 MG capsule Take 300 mg by mouth 2 (two) times daily.    HYDROcodone-acetaminophen (NORCO) 10-325 MG tablet Take 1 tablet by mouth every 6 (six) hours as needed for severe pain.   Multiple Vitamins-Minerals (CENTRUM SILVER PO) Take 1 tablet by mouth daily. 50+   naphazoline-pheniramine (VISINE) 0.025-0.3 % ophthalmic solution 1 drop daily as needed for eye irritation (itching).   Omega-3 Fatty Acids (FISH OIL) 1000 MG CAPS Take 1,000 mg by mouth 2 (two) times daily.   Salicylic Acid (GOLD BOND PSORIASIS RELIEF) 3 % CREA Apply 1 application topically daily as needed (psoriasis).   SUPER B COMPLEX/C PO Take 1 tablet by mouth every evening.   tamsulosin (FLOMAX) 0.4 MG CAPS capsule Take 0.4 mg by mouth daily after supper.   traMADol (ULTRAM) 50 MG tablet Take 50 mg by mouth daily as  needed for moderate pain.    carvedilol (COREG) 3.125 MG tablet TAKE 1 TABLET TWICE A DAY   sacubitril-valsartan (ENTRESTO) 97-103 MG TAKE 1 TABLET TWICE A DAY (DOSE INCREASED 10/24/2021)     Allergies:   Patient has no known allergies.   Social History   Socioeconomic History   Marital status: Married    Spouse name: Mubashir Mallek   Number of children: 2   Years of education:  12   Highest education level: 12th grade  Occupational History   Occupation: retired, Clinical biochemist    Comment: Programmer, systems: RETIRED  Tobacco Use   Smoking status: Every Day    Packs/day: 1.00    Years: 60.00    Total pack years: 60.00    Types: Cigarettes    Start date: 08/20/1957    Passive exposure: Current   Smokeless tobacco: Never  Vaping Use   Vaping Use: Former   Start date: 09/10/2016   Quit date: 10/11/2016  Substance and Sexual Activity   Alcohol use: No    Alcohol/week: 0.0 standard drinks of alcohol    Comment: quit in July 2015   Drug use: No   Sexual activity: Not Currently  Other Topics Concern   Not on file  Social History Narrative   Accompanied by daughter [name]   Lives w/ wife, daughter   Social Determinants of Health   Financial Resource Strain: Low Risk  (06/21/2022)   Overall Financial Resource Strain (CARDIA)    Difficulty of Paying Living Expenses: Not hard at all  Food Insecurity: No Food Insecurity (06/21/2022)   Hunger Vital Sign    Worried About Running Out of Food in the Last Year: Never true    [place] in the Last Year: Never true  Transportation Needs: No Transportation Needs (06/21/2022)   PRAPARE - Hydrologist (Medical): No    Lack of Transportation (Non-Medical): No  Physical Activity: Inactive (06/21/2022)   Exercise Vital Sign    Days of Exercise per Week: 0 days    Minutes of Exercise per Session: 0 min  Stress: No Stress Concern Present (06/21/2022)   [place]    Feeling of Stress : Not at all  Social Connections: Moderately Integrated (06/21/2022)   Social Connection and Isolation Panel [NHANES]    Frequency of Communication with Friends and Family: More than three times a week    Frequency of Social Gatherings with Friends and Family: More than three times a week    Attends Religious Services: More than 4 times per year    Active Member of [employer] or Organizations: No    Attends Music therapist: Never    Marital Status: Married     Family History: The patient's family history includes Alzheimer's disease in his father and sister; Cancer in his brother; Coronary artery disease in his mother; Diabetes in his father and mother; Heart attack in his mother and sister; Heart disease in his mother; Hyperlipidemia in his son; Hypertension in his mother; Prostate cancer in his brother. There is no history of Colon cancer.  ROS:   Review of Systems  Constitutional: Negative.   HENT: Negative.    Eyes: Negative.   Respiratory: Negative.    Cardiovascular: Negative.   Gastrointestinal: Negative.   Genitourinary: Negative.   Musculoskeletal: Negative.   Skin: Negative.   Neurological: Negative.   Endo/Heme/Allergies: Negative.   Psychiatric/Behavioral: Negative.      Please see the history of present illness.    All other systems reviewed and are negative.  EKGs/Labs/Other Studies Reviewed:    The following studies were reviewed today:   EKG:  EKG is not ordered today.     Echocardiogram 08/01/2017:  Study Conclusions   - Left ventricle: The cavity size was normal. Wall thickness was    normal. Systolic function was normal. The estimated ejection  fraction was in the range of 50% to 55%. Wall motion was normal;    there were no regional wall motion abnormalities. Doppler    parameters are consistent with abnormal left ventricular    relaxation (grade 1 diastolic dysfunction).  Indeterminate filling    pressures.  - Aortic valve: Trileaflet; mildly thickened leaflets.  - Mitral valve: Calcified annulus. Normal thickness leaflets .    There was mild regurgitation.  - Atrial septum: No defect or patent foramen ovale was identified.  Myoview on 08/30/2014: IMPRESSION:  1. Large degree of inferior myocardial scar. No evidence of  ischemia. Mild LV dilatation.   2.  Inferior wall akinesis.   3. Left ventricular ejection fraction 18%   4. High-risk stress test findings* based on severely depressed EF  and degree of myocardial scar. Recent Labs: 12/19/2021: BUN 15; Creatinine, Ser 0.70; Hemoglobin 15.6; Potassium 4.4; Sodium 138  Recent Lipid Panel    Component Value Date/Time   CHOL 177 01/08/2012 0809   TRIG 77 06/08/2014 0331   HDL 53 10/18/2010 1927   CHOLHDL 1.8 10/18/2010 1927   VLDL 8 10/18/2010 1927   [place]  10/18/2010 1927    35        Total Cholesterol/HDL:CHD Risk Coronary Heart Disease Risk Table                     Men   Women  1/2 Average Risk   3.4   3.3  Average Risk       5.0   4.4  2 X Average Risk   9.6   7.1  3 X Average Risk  23.4   11.0        Use the calculated Patient Ratio above and the CHD Risk Table to determine the patient's CHD Risk.        ATP III CLASSIFICATION (LDL):  <100     mg/dL   Optimal  100-129  mg/dL   Near or Above                    Optimal  130-159  mg/dL   Borderline  160-189  mg/dL   High  >190     mg/dL   Very High     Risk Assessment/Calculations:    HYPERTENSION CONTROL Vitals:   12/11/22 1448 12/11/22 1500  BP: (!) 168/71 (!) 160/85    The patient's blood pressure is elevated above target today.  In order to address the patient's elevated BP: A current anti-hypertensive medication was adjusted today.; Blood pressure will be monitored at home to determine if medication changes need to be made.; Follow up with general cardiology has been recommended.            Physical Exam:     VS:  BP (!) 160/85 (BP Location: Left Arm, Patient Position: Sitting, Cuff Size: Normal)   Pulse 62   Ht '5\' 3"'$  (1.6 m)   Wt 152 lb 6.4 oz (69.1 kg)   SpO2 98%   BMI 27.00 kg/m     Wt Readings from Last 3 Encounters:  12/11/22 152 lb 6.4 oz (69.1 kg)  05/23/22 151 lb 12.8 oz (68.9 kg)  03/26/22 150 lb (68 kg)     GEN: Well nourished, well developed in no acute distress HEENT: Normal NECK: No JVD; No carotid bruits CARDIAC: S1/S2, RRR, no murmurs, rubs, gallops; 2+ pulses RESPIRATORY:  Clear to auscultation without rales, wheezing or rhonchi  MUSCULOSKELETAL:  No edema;  No deformity  SKIN: Warm and dry NEUROLOGIC:  Alert and oriented x 3 PSYCHIATRIC:  Normal affect   ASSESSMENT:    1. Coronary artery disease involving native heart without angina pectoris, unspecified vessel or lesion type   2. Hyperlipidemia, unspecified hyperlipidemia type   3. PAD (peripheral artery disease) ([place])   4. Chronic systolic heart failure ([place])   5. Biventricular ICD (implantable cardioverter-defibrillator) in place   6. Cardiomyopathy, ischemic   7. Hypertension, unspecified type   8. Tobacco abuse    PLAN:    In order of problems listed above:  CAD, s/p NSTEMI Stable with no anginal symptoms. No indication for ischemic evaluation. Continue aspirin, Lipitor, carvedilol, and omega-3 fatty acids.Heart healthy diet and regular cardiovascular exercise encouraged. Smoking cessation encouraged and discussed - see below.   HLD No recent labs on file.  Being managed by PCP.  Continue current medication regimen. Heart healthy diet and regular cardiovascular exercise encouraged.   PAD Longstanding history. Followed by [facility].  Continue current medication regimen.  Continue to follow-up with Dr. [name].   Chronic systolic CHF, s/p BiV ICD, ICM Echocardiogram in 2015 showed normal EF at 50 to 55%, grade 1 DD, no RWMA. Euvolemic and well compensated on exam.  Will refill Entresto upon his request.   Continue aspirin, Lipitor, carvedilol, and Entresto. Increase Coreg. Plan to discuss updating Echo at next visit if new symptoms. Low sodium diet, fluid restriction <2L, and daily weights encouraged. Educated to contact our office for weight gain of 2 lbs overnight or 5 lbs in one week. Normal device function 09/2022 seen during remote device check. Continue to follow up with EP.   5. HTN Blood pressure on arrival, 160/71, repeat BP 160/85.  SBP at home averages 140s to 150s.  Discussed SBP goal less than 130.  Increase carvedilol to 6.25 mg twice daily.  Given BP log and discussed to monitor BP at home at least 2 hours after medications and sitting for 5-10 minutes.  Discussed to bring his BP log at next office visit.  If SBP is not at goal, plan to initiate amlodipine at next OV. Low salt, heart healthy diet and regular cardiovascular exercise encouraged.   6.  Tobacco abuse Smoking cessation encouraged and discussed.  He verbalized understanding.  7. Disposition: Follow-up with me in 2 months or sooner if anything changes.  Medication Adjustments/Labs and Tests Ordered: Current medicines are reviewed at length with the patient today.  Concerns regarding medicines are outlined above.  No orders of the defined types were placed in this encounter.  Meds ordered this encounter  Medications   sacubitril-valsartan (ENTRESTO) 97-103 MG    Sig: Take 1 tablet by mouth 2 (two) times daily.    Dispense:  180 tablet    Refill:  3    OVERDUE for Apt   DISCONTD: carvedilol (COREG) 6.25 MG tablet    Sig: Take 1 tablet (6.25 mg total) by mouth 2 (two) times daily with a meal.    Dispense:  60 tablet    Refill:  0   carvedilol (COREG) 6.25 MG tablet    Sig: Take 1 tablet (6.25 mg total) by mouth 2 (two) times daily with a meal.    Dispense:  180 tablet    Refill:  3    Dose change new Rx    Patient Instructions  Medication Instructions:   INCREASE Carvedilol (Coreg) to 6.25 mg 2 times a day    *If you need a refill on  your cardiac medications before your next appointment, please call your pharmacy*  Lab Work: NONE ordered at this time of appointment   If you have labs (blood work) drawn today and your tests are completely normal, you will receive your results only by: [place] (if you have MyChart) OR A paper copy in the mail If you have any lab test that is abnormal or we need to change your treatment, we will call you to review the results.  Testing/Procedures: NONE ordered at this time of appointment   Follow-Up: At [facility] you and your health needs are our priority.  As part of our continuing mission to provide you with exceptional heart care, we have created designated Provider Care Teams.  These Care Teams include your primary Cardiologist (physician) and Advanced Practice Providers (APPs -  Physician Assistants and Nurse Practitioners) who all work together to provide you with the care you need, when you need it.   Your next appointment:   2 month(s)  Provider:   Finis Bud, NP   Other Instructions     Signed, Finis Bud, NP  12/12/2022 9:06 PM    [place]

## 2022-12-11 NOTE — Telephone Encounter (Signed)
Called patient and he asked that I cancel the local pharmacy Rx for Coreg and just sent to mail order pharmacy that he has enough of previous dose that he can double up until he runs out for the mail order.

## 2022-12-12 ENCOUNTER — Encounter: Payer: Self-pay | Admitting: Nurse Practitioner

## 2022-12-12 DIAGNOSIS — I10 Essential (primary) hypertension: Secondary | ICD-10-CM | POA: Diagnosis not present

## 2022-12-12 DIAGNOSIS — I110 Hypertensive heart disease with heart failure: Secondary | ICD-10-CM | POA: Diagnosis not present

## 2022-12-12 DIAGNOSIS — M545 Low back pain, unspecified: Secondary | ICD-10-CM | POA: Diagnosis not present

## 2022-12-12 DIAGNOSIS — I671 Cerebral aneurysm, nonruptured: Secondary | ICD-10-CM | POA: Diagnosis not present

## 2022-12-12 DIAGNOSIS — E875 Hyperkalemia: Secondary | ICD-10-CM | POA: Diagnosis not present

## 2022-12-12 DIAGNOSIS — Z0000 Encounter for general adult medical examination without abnormal findings: Secondary | ICD-10-CM | POA: Diagnosis not present

## 2022-12-12 DIAGNOSIS — F17210 Nicotine dependence, cigarettes, uncomplicated: Secondary | ICD-10-CM | POA: Diagnosis not present

## 2022-12-12 DIAGNOSIS — R7303 Prediabetes: Secondary | ICD-10-CM | POA: Diagnosis not present

## 2022-12-12 DIAGNOSIS — G8929 Other chronic pain: Secondary | ICD-10-CM | POA: Diagnosis not present

## 2022-12-12 DIAGNOSIS — D509 Iron deficiency anemia, unspecified: Secondary | ICD-10-CM | POA: Diagnosis not present

## 2022-12-12 DIAGNOSIS — I5022 Chronic systolic (congestive) heart failure: Secondary | ICD-10-CM | POA: Diagnosis not present

## 2022-12-12 DIAGNOSIS — I700 Atherosclerosis of aorta: Secondary | ICD-10-CM | POA: Diagnosis not present

## 2022-12-25 DIAGNOSIS — Z79891 Long term (current) use of opiate analgesic: Secondary | ICD-10-CM | POA: Diagnosis not present

## 2022-12-25 DIAGNOSIS — M5451 Vertebrogenic low back pain: Secondary | ICD-10-CM | POA: Diagnosis not present

## 2022-12-25 DIAGNOSIS — G8929 Other chronic pain: Secondary | ICD-10-CM | POA: Diagnosis not present

## 2022-12-25 DIAGNOSIS — M961 Postlaminectomy syndrome, not elsewhere classified: Secondary | ICD-10-CM | POA: Diagnosis not present

## 2023-01-02 ENCOUNTER — Ambulatory Visit (INDEPENDENT_AMBULATORY_CARE_PROVIDER_SITE_OTHER): Payer: Medicare Other

## 2023-01-02 DIAGNOSIS — I255 Ischemic cardiomyopathy: Secondary | ICD-10-CM

## 2023-01-02 LAB — CUP PACEART REMOTE DEVICE CHECK
Battery Remaining Longevity: 12 mo
Battery Remaining Percentage: 15 %
Battery Voltage: 2.74 V
Brady Statistic AP VP Percent: 34 %
Brady Statistic AP VS Percent: 1 %
Brady Statistic AS VP Percent: 64 %
Brady Statistic AS VS Percent: 1 %
Brady Statistic RA Percent Paced: 33 %
Date Time Interrogation Session: 20240207080812
HighPow Impedance: 69 Ohm
HighPow Impedance: 69 Ohm
Implantable Lead Connection Status: 753985
Implantable Lead Connection Status: 753985
Implantable Lead Connection Status: 753985
Implantable Lead Implant Date: 20160923
Implantable Lead Implant Date: 20160923
Implantable Lead Implant Date: 20160923
Implantable Lead Location: 753858
Implantable Lead Location: 753859
Implantable Lead Location: 753860
Implantable Lead Model: 7122
Implantable Pulse Generator Implant Date: 20160923
Lead Channel Impedance Value: 450 Ohm
Lead Channel Impedance Value: 460 Ohm
Lead Channel Impedance Value: 890 Ohm
Lead Channel Pacing Threshold Amplitude: 0.75 V
Lead Channel Pacing Threshold Amplitude: 0.875 V
Lead Channel Pacing Threshold Amplitude: 1.5 V
Lead Channel Pacing Threshold Pulse Width: 0.5 ms
Lead Channel Pacing Threshold Pulse Width: 0.5 ms
Lead Channel Pacing Threshold Pulse Width: 0.8 ms
Lead Channel Sensing Intrinsic Amplitude: 12 mV
Lead Channel Sensing Intrinsic Amplitude: 3.2 mV
Lead Channel Setting Pacing Amplitude: 2 V
Lead Channel Setting Pacing Amplitude: 2 V
Lead Channel Setting Pacing Amplitude: 2.5 V
Lead Channel Setting Pacing Pulse Width: 0.5 ms
Lead Channel Setting Pacing Pulse Width: 0.8 ms
Lead Channel Setting Sensing Sensitivity: 0.5 mV
Pulse Gen Serial Number: 7294918
Zone Setting Status: 755011

## 2023-01-14 DIAGNOSIS — G8929 Other chronic pain: Secondary | ICD-10-CM | POA: Diagnosis not present

## 2023-01-14 DIAGNOSIS — M960 Pseudarthrosis after fusion or arthrodesis: Secondary | ICD-10-CM | POA: Diagnosis not present

## 2023-01-14 DIAGNOSIS — Z6826 Body mass index (BMI) 26.0-26.9, adult: Secondary | ICD-10-CM | POA: Diagnosis not present

## 2023-01-16 ENCOUNTER — Other Ambulatory Visit: Payer: Self-pay | Admitting: Nurse Practitioner

## 2023-01-18 DIAGNOSIS — H26493 Other secondary cataract, bilateral: Secondary | ICD-10-CM | POA: Diagnosis not present

## 2023-01-18 DIAGNOSIS — H31011 Macula scars of posterior pole (postinflammatory) (post-traumatic), right eye: Secondary | ICD-10-CM | POA: Diagnosis not present

## 2023-01-22 ENCOUNTER — Ambulatory Visit (INDEPENDENT_AMBULATORY_CARE_PROVIDER_SITE_OTHER)
Admission: RE | Admit: 2023-01-22 | Discharge: 2023-01-22 | Disposition: A | Discharge status: Home / Self Care | Payer: Medicare Other | Source: Ambulatory Visit | Attending: Vascular Surgery | Admitting: Vascular Surgery

## 2023-01-22 ENCOUNTER — Ambulatory Visit (INDEPENDENT_AMBULATORY_CARE_PROVIDER_SITE_OTHER): Payer: Medicare Other | Admitting: Physician Assistant

## 2023-01-22 ENCOUNTER — Ambulatory Visit (HOSPITAL_COMMUNITY)
Admission: RE | Admit: 2023-01-22 | Discharge: 2023-01-22 | Disposition: A | Discharge status: Home / Self Care | Payer: Medicare Other | Source: Ambulatory Visit | Attending: Vascular Surgery | Admitting: Vascular Surgery

## 2023-01-22 VITALS — BP 144/74 | HR 61 | Temp 97.8°F | Resp 20 | Ht 63.0 in | Wt 148.8 lb

## 2023-01-22 DIAGNOSIS — I739 Peripheral vascular disease, unspecified: Secondary | ICD-10-CM | POA: Diagnosis not present

## 2023-01-22 DIAGNOSIS — R0989 Other specified symptoms and signs involving the circulatory and respiratory systems: Secondary | ICD-10-CM | POA: Insufficient documentation

## 2023-01-22 DIAGNOSIS — I6522 Occlusion and stenosis of left carotid artery: Secondary | ICD-10-CM | POA: Diagnosis not present

## 2023-01-22 DIAGNOSIS — I70213 Atherosclerosis of native arteries of extremities with intermittent claudication, bilateral legs: Secondary | ICD-10-CM

## 2023-01-22 LAB — VAS US ABI WITH/WO TBI
Left ABI: 0.86
Right ABI: 0.83

## 2023-01-22 NOTE — Progress Notes (Unsigned)
Office Note   History of Present Illness   Roy [name] is a 77 y.o. (Jun 10, 1946) male who presents for surveillance of PAD.  His prior procedures include: 1) bilateral external iliac artery stenting in 2018  2) left femoral to above-knee popliteal artery bypass graft with GSV in 2018  3) incision and drainage of distal incision seroma in 2018 4) right SFA atherectomy and drug-coated balloon angioplasty, right external iliac angioplasty in 2019  5) right SFA stenting and left common femoral drug-coated balloon angioplasty in 2021 6) right external iliac and femoral artery endarterectomy, right common iliac stenting, right SFA stenting in 2021 7) right external iliac artery stenting, left common femoral artery drug-coated balloon angioplasty in 2021  8) right SFA stenting and left external iliac artery angioplasty in 2022  9) left external iliac artery stenting, left common femoral artery drug-coated balloon angioplasty in 2023  He has a history of TIA in 2009 with a right ICA stent.  He has an ICD in place.  He has a history of congestive heart failure and COPD.  He takes aspirin, Brilinta, and statin daily.  He is a chronic smoker with no desire in quitting.  He currently smokes 1.5 PPD.  At follow-up today, he is doing well.  He denies any claudication, rest pain, nonhealing wounds of lower extremities.  He denies any symptoms of stroke such as sudden changes in vision, sudden weakness/numbness, slurred speech, or facial droop.  Current Outpatient Medications  Medication Sig Dispense Refill   acetaminophen (TYLENOL) 500 MG tablet Take 1,000 mg by mouth every 8 (eight) hours as needed for moderate pain or headache.     aspirin EC 81 MG tablet Take 81 mg by mouth every evening.      atorvastatin (LIPITOR) 20 MG tablet Take 20 mg by mouth every evening.     BRILINTA 90 MG TABS tablet Take 90 mg by mouth 2 (two) times daily.     carvedilol (COREG) 6.25 MG tablet TAKE 1 TABLET(6.25  MG) BY MOUTH TWICE DAILY WITH A MEAL 180 tablet 3   cetirizine (ZYRTEC) 10 MG tablet Take 10 mg by mouth daily.     dextromethorphan-guaiFENesin (MUCINEX DM) 30-600 MG 12hr tablet Take 1 tablet by mouth daily.      gabapentin (NEURONTIN) 300 MG capsule Take 300 mg by mouth 2 (two) times daily.      HYDROcodone-acetaminophen (NORCO) 10-325 MG tablet Take 1 tablet by mouth every 6 (six) hours as needed for severe pain.     magnesium oxide (MAG-OX) 400 MG tablet Take 400 mg by mouth every evening. (Patient not taking: Reported on 12/11/2022)     Multiple Vitamins-Minerals (CENTRUM SILVER PO) Take 1 tablet by mouth daily. 50+     naphazoline-pheniramine (VISINE) 0.025-0.3 % ophthalmic solution 1 drop daily as needed for eye irritation (itching).     Omega-3 Fatty Acids (FISH OIL) 1000 MG CAPS Take 1,000 mg by mouth 2 (two) times daily.     sacubitril-valsartan (ENTRESTO) 97-103 MG Take 1 tablet by mouth 2 (two) times daily. 99991111 tablet 3   Salicylic Acid (GOLD BOND PSORIASIS RELIEF) 3 % CREA Apply 1 application topically daily as needed (psoriasis).     silodosin (RAPAFLO) 8 MG CAPS capsule Take 8 mg by mouth daily with breakfast.     SUPER B COMPLEX/C PO Take 1 tablet by mouth every evening.     tamsulosin (FLOMAX) 0.4 MG CAPS capsule Take 0.4 mg by mouth daily after supper.  traMADol (ULTRAM) 50 MG tablet Take 50 mg by mouth daily as needed for moderate pain.      No current facility-administered medications for this visit.    REVIEW OF SYSTEMS (negative unless checked):   Cardiac:  '[]'$  Chest pain or chest pressure? '[]'$  Shortness of breath upon activity? '[]'$  Shortness of breath when lying flat? '[]'$  Irregular heart rhythm?  Vascular:  '[]'$  Pain in calf, thigh, or hip brought on by walking? '[]'$  Pain in feet at night that wakes you up from your sleep? '[]'$  Blood clot in your veins? '[]'$  Leg swelling?  Pulmonary:  '[]'$  Oxygen at home? '[]'$  Productive cough? '[]'$  Wheezing?  Neurologic:  '[]'$  Sudden  weakness in arms or legs? '[]'$  Sudden numbness in arms or legs? '[]'$  Sudden onset of difficult speaking or slurred speech? '[]'$  Temporary loss of vision in one eye? '[]'$  Problems with dizziness?  Gastrointestinal:  '[]'$  Blood in stool? '[]'$  Vomited blood?  Genitourinary:  '[]'$  Burning when urinating? '[]'$  Blood in urine?  Psychiatric:  '[]'$  Major depression  Hematologic:  '[]'$  Bleeding problems? '[]'$  Problems with blood clotting?  Dermatologic:  '[]'$  Rashes or ulcers?  Constitutional:  '[]'$  Fever or chills?  Ear/Nose/Throat:  '[]'$  Change in hearing? '[]'$  Nose bleeds? '[]'$  Sore throat?  Musculoskeletal:  '[]'$  Back pain? '[]'$  Joint pain? '[]'$  Muscle pain?   Physical Examination   Vitals:   01/22/23 1011 01/22/23 1013  BP: (!) 161/72 (!) 144/74  Pulse: 61   Resp: 20   Temp: 97.8 F (36.6 C)   TempSrc: Temporal   SpO2: 97%   Weight: 148 lb 12.8 oz (67.5 kg)   Height: '5\' 3"'$  (1.6 m)    Body mass index is 26.36 kg/m.  General:  WDWN in NAD; vital signs documented above Gait: Not observed HENT: WNL, normocephalic Pulmonary: normal non-labored breathing Cardiac: RRR Abdomen: soft, NT, no masses Skin: without rashes Vascular Exam/Pulses: Palpable DP pulses bilaterally Extremities: without ischemic changes, without gangrene , without cellulitis; without open wounds;  Musculoskeletal: no muscle wasting or atrophy  Neurologic: A&O X 3;  No focal weakness or paresthesias are detected Psychiatric:  The pt has Normal affect.  Non-Invasive Vascular Imaging ABI (01/22/2023) R:  ABI: 0.83 (0.98),  PT: tri DP: tri TBI:  0.62 (0.87) L:  ABI: 0.86 (1.01),  PT: tri DP: tri TBI: 0.67 (0.27)  Aortoiliac Duplex (01/22/2023) Left: Patent left external iliac artery stent with no stenosis.  There is 50 to 99% stenosis of the left common iliac artery just proximal to the stent with PSV 299.  Right: Patent right common iliac artery to external iliac artery stents with no signs of stenosis.  Bilateral  Lower Extremity Arterial Duplex (01/22/2023) Left: Patent left femoral to popliteal artery bypass graft with no signs of stenosis or reduced velocities.  There is 50 to 74% stenosis of the distal common femoral artery with PSV 256, just proximal to the bypass.  Right: Patent right SFA stent with proximal in-stent stenosis with PSV 383.  There is also 50 to 74% stenosis of the distal common femoral artery, just proximal to the right SFA stent.   Medical Decision Making   CHINONSO MINNIEAR is a 77 y.o. male who presents for surveillance of PAD  Based on the patient's vascular studies, ABI decreased on the right from 0.98 to 0.83. ABI decreased on the left from 1.01 to 0.86 Aortoiliac duplex demonstrates a patent left external iliac artery stent with no signs of stenosis, however there is >50% of  the left common iliac artery just proximal to the stent with PSV 299.  There is a patent right common iliac artery to external iliac artery stent with no signs of stenosis. Left lower extremity arterial duplex demonstrates a patent left femoral to popliteal artery bypass graft with no signs of stenosis or sluggish velocities.  There is 50-74% stenosis of the distal common femoral artery just proximal to the bypass. Right lower extremity arterial duplex demonstrates a patent SFA stent with proximal in-stent stenosis with PSV 383.  There is also greater than 50% stenosis of the distal common femoral artery just proximal to the stent Carotid artery duplex study demonstrates 1 to 39% ICA stenosis bilaterally.  He denies any strokelike symptoms Since patient's ABIs have decreased bilaterally, and there is proximal in-stent stenosis of the right SFA stent with velocities of almost 400, he will likely require angiogram to intervene on his right leg.  At this time he is currently asymptomatic of claudication, rest pain, ulcerations of bilateral lower extremities. The patient will be scheduled for right lower extremity  angiogram with possible intervention in the next few weeks with Dr. [name].  The patient is aware that if any critical issues in the left leg are visualized, Dr.Brabham may also decide to intervene on the left leg. He can repeat his carotid duplex in 1 yr   Vicente Serene PA-C [facility]: [facility] MD: Roxanne Mins

## 2023-01-22 NOTE — H&P (View-Only) (Signed)
Office Note   History of Present Illness   Roy [name] is a 77 y.o. (Jun 10, 1946) male who presents for surveillance of PAD.  His prior procedures include: 1) bilateral external iliac artery stenting in 2018  2) left femoral to above-knee popliteal artery bypass graft with GSV in 2018  3) incision and drainage of distal incision seroma in 2018 4) right SFA atherectomy and drug-coated balloon angioplasty, right external iliac angioplasty in 2019  5) right SFA stenting and left common femoral drug-coated balloon angioplasty in 2021 6) right external iliac and femoral artery endarterectomy, right common iliac stenting, right SFA stenting in 2021 7) right external iliac artery stenting, left common femoral artery drug-coated balloon angioplasty in 2021  8) right SFA stenting and left external iliac artery angioplasty in 2022  9) left external iliac artery stenting, left common femoral artery drug-coated balloon angioplasty in 2023  He has a history of TIA in 2009 with a right ICA stent.  He has an ICD in place.  He has a history of congestive heart failure and COPD.  He takes aspirin, Brilinta, and statin daily.  He is a chronic smoker with no desire in quitting.  He currently smokes 1.5 PPD.  At follow-up today, he is doing well.  He denies any claudication, rest pain, nonhealing wounds of lower extremities.  He denies any symptoms of stroke such as sudden changes in vision, sudden weakness/numbness, slurred speech, or facial droop.  Current Outpatient Medications  Medication Sig Dispense Refill   acetaminophen (TYLENOL) 500 MG tablet Take 1,000 mg by mouth every 8 (eight) hours as needed for moderate pain or headache.     aspirin EC 81 MG tablet Take 81 mg by mouth every evening.      atorvastatin (LIPITOR) 20 MG tablet Take 20 mg by mouth every evening.     BRILINTA 90 MG TABS tablet Take 90 mg by mouth 2 (two) times daily.     carvedilol (COREG) 6.25 MG tablet TAKE 1 TABLET(6.25  MG) BY MOUTH TWICE DAILY WITH A MEAL 180 tablet 3   cetirizine (ZYRTEC) 10 MG tablet Take 10 mg by mouth daily.     dextromethorphan-guaiFENesin (MUCINEX DM) 30-600 MG 12hr tablet Take 1 tablet by mouth daily.      gabapentin (NEURONTIN) 300 MG capsule Take 300 mg by mouth 2 (two) times daily.      HYDROcodone-acetaminophen (NORCO) 10-325 MG tablet Take 1 tablet by mouth every 6 (six) hours as needed for severe pain.     magnesium oxide (MAG-OX) 400 MG tablet Take 400 mg by mouth every evening. (Patient not taking: Reported on 12/11/2022)     Multiple Vitamins-Minerals (CENTRUM SILVER PO) Take 1 tablet by mouth daily. 50+     naphazoline-pheniramine (VISINE) 0.025-0.3 % ophthalmic solution 1 drop daily as needed for eye irritation (itching).     Omega-3 Fatty Acids (FISH OIL) 1000 MG CAPS Take 1,000 mg by mouth 2 (two) times daily.     sacubitril-valsartan (ENTRESTO) 97-103 MG Take 1 tablet by mouth 2 (two) times daily. 99991111 tablet 3   Salicylic Acid (GOLD BOND PSORIASIS RELIEF) 3 % CREA Apply 1 application topically daily as needed (psoriasis).     silodosin (RAPAFLO) 8 MG CAPS capsule Take 8 mg by mouth daily with breakfast.     SUPER B COMPLEX/C PO Take 1 tablet by mouth every evening.     tamsulosin (FLOMAX) 0.4 MG CAPS capsule Take 0.4 mg by mouth daily after supper.  traMADol (ULTRAM) 50 MG tablet Take 50 mg by mouth daily as needed for moderate pain.      No current facility-administered medications for this visit.    REVIEW OF SYSTEMS (negative unless checked):   Cardiac:  '[]'$  Chest pain or chest pressure? '[]'$  Shortness of breath upon activity? '[]'$  Shortness of breath when lying flat? '[]'$  Irregular heart rhythm?  Vascular:  '[]'$  Pain in calf, thigh, or hip brought on by walking? '[]'$  Pain in feet at night that wakes you up from your sleep? '[]'$  Blood clot in your veins? '[]'$  Leg swelling?  Pulmonary:  '[]'$  Oxygen at home? '[]'$  Productive cough? '[]'$  Wheezing?  Neurologic:  '[]'$  Sudden  weakness in arms or legs? '[]'$  Sudden numbness in arms or legs? '[]'$  Sudden onset of difficult speaking or slurred speech? '[]'$  Temporary loss of vision in one eye? '[]'$  Problems with dizziness?  Gastrointestinal:  '[]'$  Blood in stool? '[]'$  Vomited blood?  Genitourinary:  '[]'$  Burning when urinating? '[]'$  Blood in urine?  Psychiatric:  '[]'$  Major depression  Hematologic:  '[]'$  Bleeding problems? '[]'$  Problems with blood clotting?  Dermatologic:  '[]'$  Rashes or ulcers?  Constitutional:  '[]'$  Fever or chills?  Ear/Nose/Throat:  '[]'$  Change in hearing? '[]'$  Nose bleeds? '[]'$  Sore throat?  Musculoskeletal:  '[]'$  Back pain? '[]'$  Joint pain? '[]'$  Muscle pain?   Physical Examination   Vitals:   01/22/23 1011 01/22/23 1013  BP: (!) 161/72 (!) 144/74  Pulse: 61   Resp: 20   Temp: 97.8 F (36.6 C)   TempSrc: Temporal   SpO2: 97%   Weight: 148 lb 12.8 oz (67.5 kg)   Height: '5\' 3"'$  (1.6 m)    Body mass index is 26.36 kg/m.  General:  WDWN in NAD; vital signs documented above Gait: Not observed HENT: WNL, normocephalic Pulmonary: normal non-labored breathing Cardiac: RRR Abdomen: soft, NT, no masses Skin: without rashes Vascular Exam/Pulses: Palpable DP pulses bilaterally Extremities: without ischemic changes, without gangrene , without cellulitis; without open wounds;  Musculoskeletal: no muscle wasting or atrophy  Neurologic: A&O X 3;  No focal weakness or paresthesias are detected Psychiatric:  The pt has Normal affect.  Non-Invasive Vascular Imaging ABI (01/22/2023) R:  ABI: 0.83 (0.98),  PT: tri DP: tri TBI:  0.62 (0.87) L:  ABI: 0.86 (1.01),  PT: tri DP: tri TBI: 0.67 (0.27)  Aortoiliac Duplex (01/22/2023) Left: Patent left external iliac artery stent with no stenosis.  There is 50 to 99% stenosis of the left common iliac artery just proximal to the stent with PSV 299.  Right: Patent right common iliac artery to external iliac artery stents with no signs of stenosis.  Bilateral  Lower Extremity Arterial Duplex (01/22/2023) Left: Patent left femoral to popliteal artery bypass graft with no signs of stenosis or reduced velocities.  There is 50 to 74% stenosis of the distal common femoral artery with PSV 256, just proximal to the bypass.  Right: Patent right SFA stent with proximal in-stent stenosis with PSV 383.  There is also 50 to 74% stenosis of the distal common femoral artery, just proximal to the right SFA stent.   Medical Decision Making   Roy [name] is a 77 y.o. male who presents for surveillance of PAD  Based on the patient's vascular studies, ABI decreased on the right from 0.98 to 0.83. ABI decreased on the left from 1.01 to 0.86 Aortoiliac duplex demonstrates a patent left external iliac artery stent with no signs of stenosis, however there is >50% of  the left common iliac artery just proximal to the stent with PSV 299.  There is a patent right common iliac artery to external iliac artery stent with no signs of stenosis. Left lower extremity arterial duplex demonstrates a patent left femoral to popliteal artery bypass graft with no signs of stenosis or sluggish velocities.  There is 50-74% stenosis of the distal common femoral artery just proximal to the bypass. Right lower extremity arterial duplex demonstrates a patent SFA stent with proximal in-stent stenosis with PSV 383.  There is also greater than 50% stenosis of the distal common femoral artery just proximal to the stent Carotid artery duplex study demonstrates 1 to 39% ICA stenosis bilaterally.  He denies any strokelike symptoms Since patient's ABIs have decreased bilaterally, and there is proximal in-stent stenosis of the right SFA stent with velocities of almost 400, he will likely require angiogram to intervene on his right leg.  At this time he is currently asymptomatic of claudication, rest pain, ulcerations of bilateral lower extremities. The patient will be scheduled for right lower extremity  angiogram with possible intervention in the next few weeks with Dr. [name].  The patient is aware that if any critical issues in the left leg are visualized, Dr.Brabham may also decide to intervene on the left leg. He can repeat his carotid duplex in 1 yr   Vicente Serene PA-C [facility] MD: Roxanne Mins

## 2023-01-24 ENCOUNTER — Encounter: Payer: Self-pay | Admitting: Radiology

## 2023-01-25 ENCOUNTER — Other Ambulatory Visit: Payer: Self-pay

## 2023-01-25 ENCOUNTER — Telehealth: Payer: Self-pay

## 2023-01-25 DIAGNOSIS — I739 Peripheral vascular disease, unspecified: Secondary | ICD-10-CM

## 2023-01-25 NOTE — Telephone Encounter (Signed)
Patient called requesting to reschedule aortogram from March 5 to March 26 because of conflict in schedule. Procedure rescheduled as requested. Instructions reviewed- he verbalized understanding.

## 2023-01-28 NOTE — Progress Notes (Signed)
Remote ICD transmission.   

## 2023-02-12 ENCOUNTER — Ambulatory Visit: Payer: Medicare Other | Attending: Pain Medicine | Admitting: Nurse Practitioner

## 2023-02-12 ENCOUNTER — Encounter: Payer: Self-pay | Admitting: Nurse Practitioner

## 2023-02-12 VITALS — BP 135/80 | HR 60 | Ht 63.0 in | Wt 150.8 lb

## 2023-02-12 DIAGNOSIS — I2510 Atherosclerotic heart disease of native coronary artery without angina pectoris: Secondary | ICD-10-CM

## 2023-02-12 DIAGNOSIS — I739 Peripheral vascular disease, unspecified: Secondary | ICD-10-CM

## 2023-02-12 DIAGNOSIS — Z95810 Presence of automatic (implantable) cardiac defibrillator: Secondary | ICD-10-CM

## 2023-02-12 DIAGNOSIS — E785 Hyperlipidemia, unspecified: Secondary | ICD-10-CM

## 2023-02-12 DIAGNOSIS — Z720 Tobacco use: Secondary | ICD-10-CM

## 2023-02-12 DIAGNOSIS — I255 Ischemic cardiomyopathy: Secondary | ICD-10-CM

## 2023-02-12 DIAGNOSIS — I5022 Chronic systolic (congestive) heart failure: Secondary | ICD-10-CM | POA: Diagnosis not present

## 2023-02-12 DIAGNOSIS — I10 Essential (primary) hypertension: Secondary | ICD-10-CM | POA: Diagnosis not present

## 2023-02-12 NOTE — Progress Notes (Signed)
Cardiology Office Note:    Date:  02/12/2023  ID:  Roy [name], DOB 1946-02-08, MRN [id number]  PCP:  Roy [name], [place] Providers Cardiologist:  Roy Dolly, MD Electrophysiologist:  Roy Peru, MD      Referring MD: Roy Squibb, MD   CC: Here for follow-up  History of Present Illness:    Roy [name] is a very pleasant 77 y.o. male with a hx of the following:  CAD, status post NSTEMI PAD Chronic systolic heart failure, status post BiV ICD Hyperlipidemia Hypertension Cerebrovascular disease, status post history of ICA stent  History of NSTEMI 2015 in the setting of respiratory failure and pneumonia.  EF at that time was 20%.  Cardiac catheterization was not pursued due to renal dysfunction and AMS.  Subsequent stress imaging supported scar.  Has been on DAPT for history of ICA stenting due to cerebrovascular disease.  In 2018 EF improved to 50 to 55/% after ICD was placed, no wall motion abnormalities.  Last seen by Dr. Carlyle [name] on October 24, 2021.  He denied any chest pain at that time.  Overall was doing very well.  Was followed by vascular for history of PAD.  Today he presents for follow-up.  BP log shows BP overall well controlled at home. Owns a wrist cuff and is looking into getting an arm cuff. He states he is doing well from a cardiac perspective. Denies any chest pain, shortness of breath, palpitations, syncope, presyncope, dizziness, orthopnea, PND, swelling or significant weight changes, acute bleeding, or claudication. Smokes 1 PPD, has been smoking since he was 77 years old. Looking into quitting smoking.   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  Past Medical History:  Diagnosis Date   AICD (automatic cardioverter/defibrillator) present 08/19/2015   St Jude BiV ICD for primary prevention by Dr. Lovena Le   Alcohol abuse    6 beers per day; hospital admission in 2009 for withdrawal symptoms   Alcoholic cirrhosis ([place])    Anxiety  and depression    denies    Cerebrovascular disease 2009   TIA; 2009- right ICA stent; re-intervention for restenosis complicated by [facility] w/o sx   CHF (congestive heart failure) ([place]) 06-02-14   Chronic obstructive pulmonary disease (HCC)    Degenerative joint disease    Total shoulder arthroplasty-right   Hyperlipidemia    Hypertension    LBBB (left bundle branch block)    Normal echo-2011; stress nuclear in 09/2010--septal hypoperfusion representing nontransmural infarction or the effect of left bundle branch block, no ischemia   Myocardial infarction [facility]) June 02, 2014   Massive Heart Attack   Peripheral vascular disease ([place])    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   Thrombocytopenia (HCC)    Tobacco abuse    -100 pack years; 1.5 packs per day   Traumatic seroma of thigh (HCC)    left   Tubular adenoma of colon     Past Surgical History:  Procedure Laterality Date   ABDOMINAL AORTAGRAM [name] 05/04/2014   Procedure: ABDOMINAL Maxcine Ham;  Surgeon: [name], MD;  Location: [facility] CATH LAB;  Service: Cardiovascular;  Laterality: [name];   ABDOMINAL AORTOGRAM [name] 06/25/2017   Procedure: Abdominal Aortogram;  Surgeon: [name], MD;  Location: [place] CV LAB;  Service: Cardiovascular;  Laterality: [name];   ABDOMINAL AORTOGRAM W/LOWER EXTREMITY [name] 04/22/2018   Procedure: ABDOMINAL AORTOGRAM W/LOWER EXTREMITY;  Surgeon: [name], MD;  Location: [place] CV LAB;  Service: Cardiovascular;  Laterality: [name];   ABDOMINAL AORTOGRAM W/LOWER EXTREMITY [name] 03/08/2020   Procedure: ABDOMINAL AORTOGRAM W/LOWER EXTREMITY;  Surgeon: [name], MD;  Location: [place] CV LAB;  Service: Cardiovascular;  Laterality: [name];   ABDOMINAL AORTOGRAM W/LOWER EXTREMITY Bilateral 08/23/2020   Procedure: ABDOMINAL AORTOGRAM W/LOWER EXTREMITY;  Surgeon: [name], MD;  Location: [place] CV LAB;  Service: Cardiovascular;  Laterality: Bilateral;   ABDOMINAL AORTOGRAM  W/LOWER EXTREMITY [name] 10/31/2021   Procedure: ABDOMINAL AORTOGRAM W/LOWER EXTREMITY;  Surgeon: [name], MD;  Location: [place] CV LAB;  Service: Cardiovascular;  Laterality: [name];   ABDOMINAL AORTOGRAM W/LOWER EXTREMITY [name] 12/19/2021   Procedure: ABDOMINAL AORTOGRAM W/LOWER EXTREMITY;  Surgeon: [name], MD;  Location: [place] CV LAB;  Service: Cardiovascular;  Laterality: [name];   AGILE CAPSULE [name] 03/18/2014   Procedure: AGILE CAPSULE;  Surgeon: Danie Binder, MD;  [facility];  Service: Endoscopy;  Laterality: [name];  1:70   APPLICATION OF WOUND VAC Left 09/03/2017   Procedure: APPLICATION OF WOUND VAC;  Surgeon: Conrad Adak, MD;  Location: [place];  Service: Vascular;  Laterality: Left;   APPLICATION OF WOUND VAC Right 04/07/2020   Procedure: PLACEMENT OF ANTIBIOTIC BEADS AND APPLICATION OF WOUND VAC;  Surgeon: [name], MD;  [facility] OR;  Service: Vascular;  Laterality: Right;   BACK SURGERY     BACTERIAL OVERGROWTH TEST [name] 05/24/2015   Procedure: BACTERIAL OVERGROWTH TEST;  Surgeon: Danie Binder, MD;  [facility];  Service: Endoscopy;  Laterality: [name];  0700   BI-VENTRICULAR IMPLANTABLE CARDIOVERTER DEFIBRILLATOR  (CRT-D)  08/19/2015   CARPAL TUNNEL RELEASE Left 02/02/2016   Procedure: LEFT CARPAL TUNNEL RELEASE;  Surgeon: Daryll Brod, MD;  Location: [place];  Service: Orthopedics;  Laterality: Left;  ANESTHESIA: IV REGIONAL UPPER ARM   CATARACT EXTRACTION W/PHACO Right 03/08/2015   Procedure: CATARACT EXTRACTION PHACO AND INTRAOCULAR LENS PLACEMENT (IOC);  Surgeon: Rutherford Guys, MD;  Location: [facility];  Service: Ophthalmology;  Laterality: Right;  CDE:9.46   CATARACT EXTRACTION W/PHACO Left 03/22/2015   Procedure: CATARACT EXTRACTION PHACO AND INTRAOCULAR LENS PLACEMENT (IOC);  Surgeon: Rutherford Guys, MD;  [facility]: [facility];  Service: Ophthalmology;  Laterality: Left;  CDE:5.80   COLONOSCOPY  08/22/09   Fields-(Tubular  Adenoma)3-mm transverse polyp/4-mm polyp otherwise noraml/small internal hemorrhoids   COLONOSCOPY [name] 04/14/2014   YFV:CBSWH internal hemorrhids/normal mocsa in the terminal iluem/left colonis redundant   COLONOSCOPY W/ POLYPECTOMY  2011   ENDARTERECTOMY FEMORAL Left 08/16/2017   Procedure: ENDARTERECTOMY LEFT PROFUNDA FEMORAL;  Surgeon: [name], MD;  [facility] OR;  Service: Vascular;  Laterality: Left;   ENDARTERECTOMY FEMORAL Right 03/31/2020   Procedure: RIGHT FEMORAL ENDARTERECTOMY;  Surgeon: [name], MD;  Location: [place];  Service: Vascular;  Laterality: Right;   EP IMPLANTABLE DEVICE [name] 08/19/2015   Procedure: BiV ICD Insertion CRT-D;  Surgeon: Evans Lance, MD;  Location: [place] CV LAB;  Service: Cardiovascular;  Laterality: [name];   ESOPHAGOGASTRODUODENOSCOPY [name] 03/05/2014   SLF: 1. Stricture at the gastroesophagael junction 2. Small hiatal hernia 3. Moderate non-erosive gastritis and duodentitis. 4. No surce for Melena identified.    FEMORAL-POPLITEAL BYPASS GRAFT Left 08/16/2017   Procedure: LEFT  FEMORAL-POPLITEAL ARTERY  BYPASS GRAFT;  Surgeon: [name], MD;  Location: [place];  Service: Vascular;  Laterality: Left;   GIVENS CAPSULE STUDY [name] 03/30/2014   Procedure: GIVENS CAPSULE STUDY;  Surgeon: Carlyon Prows  Rexene Edison, MD;  [facility];  Service: Endoscopy;  Laterality: [name];  7:30   GROIN DEBRIDEMENT Right 04/07/2020   Procedure: INCISION AND DRAINAGE OF RIGHT GROIN;  Surgeon: [name], MD;  [facility];  Service: Vascular;  Laterality: Right;   I & D EXTREMITY Left 09/03/2017   Procedure: IRRIGATION AND DEBRIDEMENT EXTREMITY LEFT THIGH SEROMA;  Surgeon: Conrad Ringwood, MD;  Location: [place];  Service: Vascular;  Laterality: Left;   INSERT / REPLACE / [place]  03/31/2020   Procedure: INSERTION OF RIGHT ILIAC ARTERY STENT AND RIGHT SUPERFICIAL FEMORAL ARTERY STENT;  Surgeon: [name], MD;  [facility];  Service: Vascular;;   IR ANGIO INTRA EXTRACRAN SEL COM CAROTID INNOMINATE BILAT MOD SED  07/16/2017   IR ANGIO INTRA EXTRACRAN SEL COM CAROTID INNOMINATE BILAT MOD SED  06/12/2019   IR ANGIO INTRA EXTRACRAN SEL COM CAROTID INNOMINATE BILAT MOD SED  01/17/2021   IR ANGIO VERTEBRAL SEL SUBCLAVIAN INNOMINATE BILAT MOD SED  07/16/2017   IR ANGIO VERTEBRAL SEL SUBCLAVIAN INNOMINATE BILAT MOD SED  06/12/2019   IR ANGIO VERTEBRAL SEL SUBCLAVIAN INNOMINATE BILAT MOD SED  01/17/2021   IR RADIOLOGIST EVAL & MGMT  04/01/2017   IR RADIOLOGIST EVAL & MGMT  08/02/2017   IR TRANSCATH EXCRAN VERT OR CAR A STENT  07/22/2019   IR US GUIDE VASC ACCESS RIGHT  06/12/2019   IR US GUIDE VASC ACCESS RIGHT  01/17/2021   JOINT REPLACEMENT Right    Total Shoulder Replacement    LOWER EXTREMITY ANGIOGRAPHY Bilateral 06/25/2017   Procedure: Lower Extremity Angiography;  Surgeon: [name], MD;  Location: [place] CV LAB;  Service: Cardiovascular;  Laterality: Bilateral;   LUMBAR FUSION  2010   PATCH ANGIOPLASTY Right 03/31/2020   Procedure: PATCH ANGIOPLASTY USING XENOSURE BIOLOGIC PATCH 1cm x 14cm;  Surgeon: [name], MD;  Location: [facility] OR;  Service: Vascular;  Laterality: Right;   PERIPHERAL VASCULAR ATHERECTOMY Right 04/22/2018   Procedure: PERIPHERAL VASCULAR ATHERECTOMY;  Surgeon: [name], MD;  Location: [place] CV LAB;  Service: Cardiovascular;  Laterality: Right;  right superficial femoral   PERIPHERAL VASCULAR BALLOON ANGIOPLASTY Right 04/22/2018   Procedure: PERIPHERAL VASCULAR BALLOON ANGIOPLASTY;  Surgeon: [name], MD;  Location: [place] CV LAB;  Service: Cardiovascular;  Laterality: Right;  external iliac   PERIPHERAL VASCULAR BALLOON ANGIOPLASTY Left 03/08/2020   Procedure: PERIPHERAL VASCULAR BALLOON ANGIOPLASTY;  Surgeon: [name], MD;  Location: [place] CV LAB;  Service: Cardiovascular;  Laterality: Left;  Common Femoral   PERIPHERAL VASCULAR BALLOON  ANGIOPLASTY Left 08/23/2020   Procedure: PERIPHERAL VASCULAR BALLOON ANGIOPLASTY;  Surgeon: [name], MD;  Location: [place] CV LAB;  Service: Cardiovascular;  Laterality: Left;  common femoral   PERIPHERAL VASCULAR BALLOON ANGIOPLASTY Left 10/31/2021   Procedure: PERIPHERAL VASCULAR BALLOON ANGIOPLASTY;  Surgeon: [name], MD;  Location: [place] CV LAB;  Service: Cardiovascular;  Laterality: Left;  external iliac   PERIPHERAL VASCULAR INTERVENTION Right 03/08/2020   Procedure: PERIPHERAL VASCULAR INTERVENTION;  Surgeon: [name], MD;  Location: [place] CV LAB;  Service: Cardiovascular;  Laterality: Right;  SFA   PERIPHERAL VASCULAR INTERVENTION Right 08/23/2020   Procedure: PERIPHERAL VASCULAR INTERVENTION;  Surgeon: [name], MD;  Location: [place] CV LAB;  Service: Cardiovascular;  Laterality: Right;  external iliac   PERIPHERAL VASCULAR INTERVENTION Right 10/31/2021  Procedure: PERIPHERAL VASCULAR INTERVENTION;  Surgeon: [name], MD;  Location: [place] CV LAB;  Service: Cardiovascular;  Laterality: Right;  superficial femoral artery   PERIPHERAL VASCULAR INTERVENTION Left 12/19/2021   Procedure: PERIPHERAL VASCULAR INTERVENTION;  Surgeon: [name], MD;  Location: [place] CV LAB;  Service: Cardiovascular;  Laterality: Left;  stent lt ext iliac / pta common femoral   RADIOLOGY WITH ANESTHESIA [name] 07/01/2019   Procedure: STENTING;  Surgeon: Luanne Bras, MD;  Location: [place];  Service: Radiology;  Laterality: [name];   RADIOLOGY WITH ANESTHESIA [name] 07/22/2019   Procedure: STENTING;  Surgeon: Luanne Bras, MD;  Location: [place];  Service: Radiology;  Laterality: [name];   TOTAL SHOULDER ARTHROPLASTY Right 2011   Dr. Rafael Bihari NERVE TRANSPOSITION Left 02/02/2016   Procedure: LEFT IN-SITU DECOMPRESSION ULNAR NERVE ;  Surgeon: Daryll Brod, MD;  Location: [place];  Service: Orthopedics;  Laterality: Left;    ULNAR TUNNEL RELEASE Left 02/02/2016   Procedure: LEFT CUBITAL TUNNEL RELEASE;  Surgeon: Daryll Brod, MD;  Location: [place];  Service: Orthopedics;  Laterality: Left;   VASECTOMY  1971   VEIN HARVEST Left 08/16/2017   Procedure: USING NON REVERSE LEFT GREATER SAPHENOUS VEIN HARVEST;  Surgeon: [name], MD;  [facility];  Service: Vascular;  Laterality: Left;    Current Medications: Current Meds  Medication Sig   acetaminophen (TYLENOL) 500 MG tablet Take 1,000 mg by mouth every 8 (eight) hours as needed for moderate pain or headache.   aspirin EC 81 MG tablet Take 81 mg by mouth every evening.    atorvastatin (LIPITOR) 20 MG tablet Take 20 mg by mouth every evening.   BRILINTA 90 MG TABS tablet Take 90 mg by mouth 2 (two) times daily.   carvedilol (COREG) 6.25 MG tablet TAKE 1 TABLET(6.25 MG) BY MOUTH TWICE DAILY WITH A MEAL   cetirizine (ZYRTEC) 10 MG tablet Take 10 mg by mouth daily.   dextromethorphan-guaiFENesin (MUCINEX DM) 30-600 MG 12hr tablet Take 1 tablet by mouth daily.    gabapentin (NEURONTIN) 300 MG capsule Take 300 mg by mouth 2 (two) times daily.    HYDROcodone-acetaminophen (NORCO) 10-325 MG tablet Take 1 tablet by mouth every 6 (six) hours as needed for severe pain.   Multiple Vitamins-Minerals (CENTRUM SILVER PO) Take 1 tablet by mouth daily. 50+   naphazoline-pheniramine (VISINE) 0.025-0.3 % ophthalmic solution 1 drop daily as needed for eye irritation (itching).   Omega-3 Fatty Acids (FISH OIL) 1000 MG CAPS Take 1,000 mg by mouth 2 (two) times daily.   sacubitril-valsartan (ENTRESTO) 97-103 MG Take 1 tablet by mouth 2 (two) times daily.   Salicylic Acid (GOLD BOND PSORIASIS RELIEF) 3 % CREA Apply 1 application topically daily as needed (psoriasis).   silodosin (RAPAFLO) 8 MG CAPS capsule Take 8 mg by mouth daily with breakfast.   SUPER B COMPLEX/C PO Take 1 tablet by mouth every evening.   tamsulosin (FLOMAX) 0.4 MG CAPS capsule Take 0.4 mg by  mouth daily after supper.   traMADol (ULTRAM) 50 MG tablet Take 50 mg by mouth daily as needed for moderate pain.      Allergies:   Patient has no known allergies.   Social History   Socioeconomic History   Marital status: Married    Spouse name: Roshad Tillema   Number of children: 2   Years of education: 12   Highest education level: 12th grade  Occupational History  Occupation: retired, Clinical biochemist    Comment: Programmer, systems: RETIRED  Tobacco Use   Smoking status: Every Day    Packs/day: 1.00    Years: 60.00    Additional pack years: 0.00    Total pack years: 60.00    Types: Cigarettes    Start date: 08/20/1957    Passive exposure: Current   Smokeless tobacco: Never  Vaping Use   Vaping Use: Former   Start date: 09/10/2016   Quit date: 10/11/2016  Substance and Sexual Activity   Alcohol use: No    Alcohol/week: 0.0 standard drinks of alcohol    Comment: quit in July 2015   Drug use: No   Sexual activity: Not Currently  Other Topics Concern   Not on file  Social History Narrative   Accompanied by daughter [name]   Lives w/ wife, daughter   Social Determinants of Health   Financial Resource Strain: Low Risk  (06/21/2022)   Overall Financial Resource Strain (CARDIA)    Difficulty of Paying Living Expenses: Not hard at all  Food Insecurity: No Food Insecurity (06/21/2022)   Hunger Vital Sign    Worried About Running Out of Food in the Last Year: Never true    [place] in the Last Year: Never true  Transportation Needs: No Transportation Needs (06/21/2022)   PRAPARE - Hydrologist (Medical): No    Lack of Transportation (Non-Medical): No  Physical Activity: Inactive (06/21/2022)   Exercise Vital Sign    Days of Exercise per Week: 0 days    Minutes of Exercise per Session: 0 min  Stress: No Stress Concern Present (06/21/2022)   [place]    Feeling  of Stress : Not at all  Social Connections: Moderately Integrated (06/21/2022)   Social Connection and Isolation Panel [NHANES]    Frequency of Communication with Friends and Family: More than three times a week    Frequency of Social Gatherings with Friends and Family: More than three times a week    Attends Religious Services: More than 4 times per year    Active Member of [employer] or Organizations: No    Attends Music therapist: Never    Marital Status: Married     Family History: The patient's family history includes Alzheimer's disease in his father and sister; Cancer in his brother; Coronary artery disease in his mother; Diabetes in his father and mother; Heart attack in his mother and sister; Heart disease in his mother; Hyperlipidemia in his son; Hypertension in his mother; Prostate cancer in his brother. There is no history of Colon cancer.  ROS:     Please see the history of present illness.    All other systems reviewed and are negative.  EKGs/Labs/Other Studies Reviewed:    The following studies were reviewed today:   EKG:  EKG is not ordered today.     Echocardiogram 08/01/2017:  Study Conclusions   - Left ventricle: The cavity size was normal. Wall thickness was    normal. Systolic function was normal. The estimated ejection    fraction was in the range of 50% to 55%. Wall motion was normal;    there were no regional wall motion abnormalities. Doppler    parameters are consistent with abnormal left ventricular    relaxation (grade 1 diastolic dysfunction). Indeterminate filling    pressures.  - Aortic valve: Trileaflet; mildly thickened leaflets.  -  Mitral valve: Calcified annulus. Normal thickness leaflets .    There was mild regurgitation.  - Atrial septum: No defect or patent foramen ovale was identified.  Myoview on 08/30/2014: IMPRESSION:  1. Large degree of inferior myocardial scar. No evidence of  ischemia. Mild LV dilatation.   2.   Inferior wall akinesis.   3. Left ventricular ejection fraction 18%   4. High-risk stress test findings* based on severely depressed EF  and degree of myocardial scar. Recent Labs: No results found for requested labs within last 365 days.  Recent Lipid Panel    Component Value Date/Time   CHOL 177 01/08/2012 0809   TRIG 77 06/08/2014 0331   HDL 53 10/18/2010 1927   CHOLHDL 1.8 10/18/2010 1927   VLDL 8 10/18/2010 1927   LDLCALC  10/18/2010 1927    35        Total Cholesterol/HDL:CHD Risk Coronary Heart Disease Risk Table                     Men   Women  1/2 Average Risk   3.4   3.3  Average Risk       5.0   4.4  2 X Average Risk   9.6   7.1  3 X Average Risk  23.4   11.0        Use the calculated Patient Ratio above and the CHD Risk Table to determine the patient's CHD Risk.        ATP III CLASSIFICATION (LDL):  <100     mg/dL   Optimal  100-129  mg/dL   Near or Above                    Optimal  130-159  mg/dL   Borderline  160-189  mg/dL   High  >190     mg/dL   Very High   Physical Exam:    VS:  BP 135/80 (BP Location: Right Arm, Patient Position: Sitting, Cuff Size: Normal)   Pulse 60   Ht 5\' 3"  (1.6 m)   Wt 150 lb 12.8 oz (68.4 kg)   SpO2 98%   BMI 26.71 kg/m     Wt Readings from Last 3 Encounters:  02/12/23 150 lb 12.8 oz (68.4 kg)  01/22/23 148 lb 12.8 oz (67.5 kg)  12/11/22 152 lb 6.4 oz (69.1 kg)     GEN: Well nourished, well developed in no acute distress HEENT: Normal NECK: No JVD; No carotid bruits CARDIAC: S1/S2, RRR, no murmurs, rubs, gallops; 2+ pulses RESPIRATORY:  Clear to auscultation without rales, wheezing or rhonchi  MUSCULOSKELETAL:  No edema; No deformity  SKIN: Warm and dry NEUROLOGIC:  Alert and oriented x 3 PSYCHIATRIC:  Normal affect   ASSESSMENT:    1. Coronary artery disease involving native heart without angina pectoris, unspecified vessel or lesion type   2. Hyperlipidemia, unspecified hyperlipidemia type   3. PAD  (peripheral artery disease) ([place])   4. Chronic systolic heart failure ([place])   5. Biventricular ICD (implantable cardioverter-defibrillator) in place   6. Cardiomyopathy, ischemic   7. Essential hypertension, benign   8. Tobacco abuse    PLAN:    In order of problems listed above:  CAD, s/p NSTEMI Stable with no anginal symptoms. No indication for ischemic evaluation. Continue current medication regimen. Heart healthy diet and regular cardiovascular exercise encouraged. Smoking cessation encouraged and discussed - see below.   HLD No recent labs on file.  Being  managed by PCP.  Continue current medication regimen. Heart healthy diet and regular cardiovascular exercise encouraged.   PAD Longstanding history. Followed by [facility].  Continue current medication regimen.  Continue to follow-up with Dr. [name].   Chronic systolic CHF, s/p BiV ICD, ICM Echocardiogram in 2015 showed normal EF at 50 to 55%, grade 1 DD, no RWMA. Euvolemic and well compensated on exam.  Continue aspirin, Coreg, Lipitor, carvedilol, and Entresto. Will update TTE at this time. Low sodium diet, fluid restriction <2L, and daily weights encouraged. Educated to contact our office for weight gain of 2 lbs overnight or 5 lbs in one week. Normal device function 12/2022 seen during remote device check. Continue to follow up with EP.   5. HTN Blood pressure on arrival elevated, repeat BP 135/80.  SBP at home overall well controlled.  Discussed SBP goal less than 130.   Given BP log and discussed to monitor BP at home at least 2 hours after medications and sitting for 5-10 minutes.  Discussed to obtain OMRON cuff and to notify office is SBP is consistently elevated > 140. If SBP is not at goal, plan to initiate amlodipine. Low salt, heart healthy diet and regular cardiovascular exercise encouraged.   6.  Tobacco abuse Smoking cessation encouraged and discussed.  He verbalized understanding.  7. Disposition: Follow-up with me or APP  in 3-4 months or sooner if anything changes.  Medication Adjustments/Labs and Tests Ordered: Current medicines are reviewed at length with the patient today.  Concerns regarding medicines are outlined above.  Orders Placed This Encounter  Procedures   ECHOCARDIOGRAM COMPLETE   No orders of the defined types were placed in this encounter.   Patient Instructions  Medication Instructions:  Your physician recommends that you continue on your current medications as directed. Please refer to the Current Medication list given to you today.   Labwork: none  Testing/Procedures: Your physician has requested that you have an echocardiogram. Echocardiography is a painless test that uses sound waves to create images of your heart. It provides your doctor with information about the size and shape of your heart and how well your heart's chambers and valves are working. This procedure takes approximately one hour. There are no restrictions for this procedure. Please do NOT wear cologne, perfume, aftershave, or lotions (deodorant is allowed). Please arrive 15 minutes prior to your appointment time.   Follow-Up:  Your physician recommends that you schedule a follow-up appointment in: 3-4 months  Any Other Special Instructions Will Be Listed Below (If Applicable).  Please see information on: Smoking Cessation Blood Pressure Log (please look into purchasing an Omron blood pressure cuff, can find on amazon for $30) [place] Six Diet  If you need a refill on your cardiac medications before your next appointment, please call your pharmacy.      SignedFinis Bud, NP  02/12/2023 12:55 PM    [place]

## 2023-02-12 NOTE — Patient Instructions (Addendum)
Medication Instructions:  Your physician recommends that you continue on your current medications as directed. Please refer to the Current Medication list given to you today.   Labwork: none  Testing/Procedures: Your physician has requested that you have an echocardiogram. Echocardiography is a painless test that uses sound waves to create images of your heart. It provides your doctor with information about the size and shape of your heart and how well your heart's chambers and valves are working. This procedure takes approximately one hour. There are no restrictions for this procedure. Please do NOT wear cologne, perfume, aftershave, or lotions (deodorant is allowed). Please arrive 15 minutes prior to your appointment time.   Follow-Up:  Your physician recommends that you schedule a follow-up appointment in: 3-4 months  Any Other Special Instructions Will Be Listed Below (If Applicable).  Please see information on: Smoking Cessation Blood Pressure Log (please look into purchasing an Omron blood pressure cuff, can find on amazon for $30) Salty Six Diet  If you need a refill on your cardiac medications before your next appointment, please call your pharmacy.

## 2023-02-19 ENCOUNTER — Encounter (HOSPITAL_COMMUNITY)
Admission: RE | Disposition: A | Discharge status: Home / Self Care | Payer: Self-pay | Source: Home / Self Care | Attending: Surgery

## 2023-02-19 ENCOUNTER — Encounter (HOSPITAL_COMMUNITY): Payer: Self-pay | Admitting: Surgery

## 2023-02-19 ENCOUNTER — Ambulatory Visit (HOSPITAL_COMMUNITY)
Admission: RE | Admit: 2023-02-19 | Discharge: 2023-02-19 | Disposition: A | Discharge status: Home / Self Care | Payer: Medicare Other | Attending: Surgery | Admitting: Surgery

## 2023-02-19 DIAGNOSIS — T82858A Stenosis of vascular prosthetic devices, implants and grafts, initial encounter: Secondary | ICD-10-CM | POA: Diagnosis not present

## 2023-02-19 DIAGNOSIS — F17210 Nicotine dependence, cigarettes, uncomplicated: Secondary | ICD-10-CM | POA: Insufficient documentation

## 2023-02-19 DIAGNOSIS — Z79899 Other long term (current) drug therapy: Secondary | ICD-10-CM | POA: Insufficient documentation

## 2023-02-19 DIAGNOSIS — Z95810 Presence of automatic (implantable) cardiac defibrillator: Secondary | ICD-10-CM | POA: Insufficient documentation

## 2023-02-19 DIAGNOSIS — I70213 Atherosclerosis of native arteries of extremities with intermittent claudication, bilateral legs: Secondary | ICD-10-CM | POA: Insufficient documentation

## 2023-02-19 DIAGNOSIS — Z7982 Long term (current) use of aspirin: Secondary | ICD-10-CM | POA: Diagnosis not present

## 2023-02-19 DIAGNOSIS — I739 Peripheral vascular disease, unspecified: Secondary | ICD-10-CM

## 2023-02-19 DIAGNOSIS — Z8673 Personal history of transient ischemic attack (TIA), and cerebral infarction without residual deficits: Secondary | ICD-10-CM | POA: Insufficient documentation

## 2023-02-19 DIAGNOSIS — I509 Heart failure, unspecified: Secondary | ICD-10-CM | POA: Diagnosis not present

## 2023-02-19 DIAGNOSIS — Z7902 Long term (current) use of antithrombotics/antiplatelets: Secondary | ICD-10-CM | POA: Insufficient documentation

## 2023-02-19 DIAGNOSIS — J449 Chronic obstructive pulmonary disease, unspecified: Secondary | ICD-10-CM | POA: Insufficient documentation

## 2023-02-19 HISTORY — PX: ABDOMINAL AORTOGRAM W/LOWER EXTREMITY: CATH118223

## 2023-02-19 HISTORY — PX: PERIPHERAL VASCULAR INTERVENTION: CATH118257

## 2023-02-19 LAB — POCT I-STAT, CHEM 8
BUN: 8 mg/dL (ref 8–23)
Calcium, Ion: 1.18 mmol/L (ref 1.15–1.40)
Chloride: 101 mmol/L (ref 98–111)
Creatinine, Ser: 0.5 mg/dL — ABNORMAL LOW (ref 0.61–1.24)
Glucose, Bld: 114 mg/dL — ABNORMAL HIGH (ref 70–99)
HCT: 40 % (ref 39.0–52.0)
Hemoglobin: 13.6 g/dL (ref 13.0–17.0)
Potassium: 4.2 mmol/L (ref 3.5–5.1)
Sodium: 139 mmol/L (ref 135–145)
TCO2: 26 mmol/L (ref 22–32)

## 2023-02-19 SURGERY — ABDOMINAL AORTOGRAM W/LOWER EXTREMITY
Anesthesia: LOCAL | Laterality: Bilateral

## 2023-02-19 MED ORDER — SODIUM CHLORIDE 0.9% FLUSH: 3.0000 mL | Freq: Two times a day (BID) | INTRAVENOUS | Status: DC

## 2023-02-19 MED ORDER — LIDOCAINE HCL (PF) 1 % IJ SOLN
INTRAMUSCULAR | Status: DC | PRN
  Administered 2023-02-19: 12 mL

## 2023-02-19 MED ORDER — FENTANYL CITRATE (PF) 100 MCG/2ML IJ SOLN
INTRAMUSCULAR | Status: AC
  Filled 2023-02-19: qty 2

## 2023-02-19 MED ORDER — ACETAMINOPHEN 325 MG PO TABS: 650.0000 mg | ORAL_TABLET | ORAL | Status: DC | PRN

## 2023-02-19 MED ORDER — MIDAZOLAM HCL 2 MG/2ML IJ SOLN
INTRAMUSCULAR | Status: DC | PRN
  Administered 2023-02-19 (×3): 1 mg via INTRAVENOUS

## 2023-02-19 MED ORDER — SODIUM CHLORIDE 0.9 % WEIGHT BASED INFUSION: 1.0000 mL/kg/h | INTRAVENOUS | Status: DC

## 2023-02-19 MED ORDER — SODIUM CHLORIDE 0.9 % IV SOLN: 250.0000 mL | INTRAVENOUS | Status: DC | PRN

## 2023-02-19 MED ORDER — LIDOCAINE HCL (PF) 1 % IJ SOLN
INTRAMUSCULAR | Status: AC
  Filled 2023-02-19: qty 30

## 2023-02-19 MED ORDER — ONDANSETRON HCL 4 MG/2ML IJ SOLN: 4.0000 mg | Freq: Four times a day (QID) | INTRAMUSCULAR | Status: DC | PRN

## 2023-02-19 MED ORDER — MIDAZOLAM HCL 2 MG/2ML IJ SOLN
INTRAMUSCULAR | Status: AC
  Filled 2023-02-19: qty 2

## 2023-02-19 MED ORDER — LABETALOL HCL 5 MG/ML IV SOLN: 10.0000 mg | INTRAVENOUS | Status: DC | PRN

## 2023-02-19 MED ORDER — HYDRALAZINE HCL 20 MG/ML IJ SOLN: 5.0000 mg | INTRAMUSCULAR | Status: DC | PRN

## 2023-02-19 MED ORDER — FENTANYL CITRATE (PF) 100 MCG/2ML IJ SOLN
INTRAMUSCULAR | Status: DC | PRN
  Administered 2023-02-19: 25 ug via INTRAVENOUS
  Administered 2023-02-19: 50 ug via INTRAVENOUS

## 2023-02-19 MED ORDER — HEPARIN SODIUM (PORCINE) 1000 UNIT/ML IJ SOLN
INTRAMUSCULAR | Status: DC | PRN
  Administered 2023-02-19: 7000 [IU] via INTRAVENOUS

## 2023-02-19 MED ORDER — SODIUM CHLORIDE 0.9% FLUSH: 3.0000 mL | INTRAVENOUS | Status: DC | PRN

## 2023-02-19 MED ORDER — SODIUM CHLORIDE 0.9 % IV SOLN: INTRAVENOUS | Status: DC

## 2023-02-19 MED ORDER — MORPHINE SULFATE (PF) 2 MG/ML IV SOLN: 2.0000 mg | INTRAVENOUS | Status: DC | PRN

## 2023-02-19 MED ORDER — HEPARIN (PORCINE) IN NACL 1000-0.9 UT/500ML-% IV SOLN
INTRAVENOUS | Status: DC | PRN
  Administered 2023-02-19 (×2): 500 mL

## 2023-02-19 MED ORDER — IODIXANOL 320 MG/ML IV SOLN
INTRAVENOUS | Status: DC | PRN
  Administered 2023-02-19: 130 mL

## 2023-02-19 MED ORDER — HEPARIN SODIUM (PORCINE) 1000 UNIT/ML IJ SOLN
INTRAMUSCULAR | Status: AC
  Filled 2023-02-19: qty 10

## 2023-02-19 MED ORDER — OXYCODONE HCL 5 MG PO TABS: 5.0000 mg | ORAL_TABLET | ORAL | Status: DC | PRN

## 2023-02-19 SURGICAL SUPPLY — 19 items
BALLN MUSTANG 6X100X135 (BALLOONS) ×2
BALLOON MUSTANG 6X100X135 (BALLOONS) IMPLANT
CATH OMNI FLUSH 5F 65CM (CATHETERS) IMPLANT
CLOSURE MYNX CONTROL 6F/7F (Vascular Products) IMPLANT
KIT ENCORE 26 ADVANTAGE (KITS) IMPLANT
KIT MICROPUNCTURE NIT STIFF (SHEATH) IMPLANT
KIT PV (KITS) ×3 IMPLANT
SHEATH CATAPULT 6FR 45 (SHEATH) IMPLANT
SHEATH PINNACLE 5F 10CM (SHEATH) IMPLANT
SHEATH PINNACLE 6F 10CM (SHEATH) IMPLANT
SHEATH PROBE COVER 6X72 (BAG) IMPLANT
STENT ELUVIA 7X120X130 (Permanent Stent) IMPLANT
STOPCOCK MORSE 400PSI 3WAY (MISCELLANEOUS) IMPLANT
SYR MEDRAD MARK 7 150ML (SYRINGE) ×3 IMPLANT
TRANSDUCER W/STOPCOCK (MISCELLANEOUS) ×3 IMPLANT
TRAY PV CATH (CUSTOM PROCEDURE TRAY) ×3 IMPLANT
TUBING CIL FLEX 10 FLL-RA (TUBING) IMPLANT
WIRE HI TORQ VERSACORE 300 (WIRE) IMPLANT
WIRE STARTER BENTSON 035X150 (WIRE) IMPLANT

## 2023-02-19 NOTE — Interval H&P Note (Signed)
History and Physical Interval Note:  02/19/2023 7:37 AM  Roy [name]  has presented today for surgery, with the diagnosis of pad.  The various methods of treatment have been discussed with the patient and family. After consideration of risks, benefits and other options for treatment, the patient has consented to  Procedure(s): ABDOMINAL AORTOGRAM W/LOWER EXTREMITY ([name]) as a surgical intervention.  The patient's history has been reviewed, patient examined, no change in status, stable for surgery.  I have reviewed the patient's chart and labs.  Questions were answered to the patient's satisfaction.     Annamarie Major

## 2023-02-19 NOTE — Discharge Instructions (Signed)
Femoral Site Care This sheet gives you information about how to care for yourself after your procedure. Your health care provider may also give you more specific instructions. If you have problems or questions, contact your health care provider. What can I expect after the procedure?  After the procedure, it is common to have: Bruising that usually fades within 1-2 weeks. Tenderness at the site. Follow these instructions at home: Wound care Follow instructions from your health care provider about how to take care of your insertion site. Make sure you: Wash your hands with soap and water before you change your bandage (dressing). If soap and water are not available, use hand sanitizer. Remove your dressing as told by your health care provider. In 24 hours Do not take baths, swim, or use a hot tub until your health care provider approves. You may shower 24-48 hours after the procedure or as told by your health care provider. Gently wash the site with plain soap and water. Pat the area dry with a clean towel. Do not rub the site. This may cause bleeding. Do not apply powder or lotion to the site. Keep the site clean and dry. Check your femoral site every day for signs of infection. Check for: Redness, swelling, or pain. Fluid or blood. Warmth. Pus or a bad smell. Activity For the first 2-3 days after your procedure, or as long as directed: Avoid climbing stairs as much as possible. Do not squat. Do not lift anything that is heavier than 10 lb (4.5 kg), or the limit that you are told, until your health care provider says that it is safe. For 5 days Rest as directed. Avoid sitting for a long time without moving. Get up to take short walks every 1-2 hours. Do not drive for 24 hours if you were given a medicine to help you relax (sedative). General instructions Take over-the-counter and prescription medicines only as told by your health care provider. Keep all follow-up visits as told by  your health care provider. This is important. Contact a health care provider if you have: A fever or chills. You have redness, swelling, or pain around your insertion site. Get help right away if: The catheter insertion area swells very fast. You pass out. You suddenly start to sweat or your skin gets clammy. The catheter insertion area is bleeding, and the bleeding does not stop when you hold steady pressure on the area. The area near or just beyond the catheter insertion site becomes pale, cool, tingly, or numb. These symptoms may represent a serious problem that is an emergency. Do not wait to see if the symptoms will go away. Get medical help right away. Call your local emergency services (911 in the [place] Do not drive yourself to the hospital. Summary After the procedure, it is common to have bruising that usually fades within 1-2 weeks. Check your femoral site every day for signs of infection. Do not lift anything that is heavier than 10 lb (4.5 kg), or the limit that you are told, until your health care provider says that it is safe. This information is not intended to replace advice given to you by your health care provider. Make sure you discuss any questions you have with your health care provider. Document Revised: 11/25/2017 Document Reviewed: 11/25/2017 Elsevier Patient Education  2020 Elsevier Inc. 

## 2023-02-19 NOTE — Op Note (Signed)
Patient name: Roy [name] MRN: [id number] DOB: 03-13-1946 Sex: male  02/19/2023 Pre-operative Diagnosis: PAD with in-stent stenosis Post-operative diagnosis:  Same Surgeon:  Annamarie Major Procedure Performed:  1.  Ultrasound-guided access, left femoral artery  2.  Abdominal aortogram (CPT 75625)  3.  Bilateral leg angiogram (CPT 75716)  4.  Right SFA stent  5.  Conscious sedation, 71 minutes  6.  Closure device (mynx)     Indications: This is a 77 year old gentleman having undergone multiple previous lower extremity interventions to had an ultrasound suggested SFA stenosis.  He comes in today for angiography.  Procedure:  The patient was identified in the holding area and taken to room 8.  The patient was then placed supine on the table and prepped and draped in the usual sterile fashion.  A time out was called.  Conscious sedation was administered with the use of IV fentanyl and Versed under continuous physician and nurse monitoring.  Heart rate, blood pressure, and oxygen saturation were continuously monitored.  Total sedation time was 71 minutes.  Ultrasound was used to evaluate the right common femoral artery.  It was patent .  A digital ultrasound image was acquired.  A micropuncture needle was used to access the right common femoral artery under ultrasound guidance.  An 018 wire was advanced without resistance and a micropuncture sheath was placed.  The 018 wire was removed and a benson wire was placed.  The micropuncture sheath was exchanged for a 5 french sheath.  An omniflush catheter was advanced over the wire to the level of L-1.  An abdominal angiogram was obtained.  Next, the cath was pulled down to the aortic bifurcation and iliofemoral imaging was performed.  Next, using the omniflush catheter and a benson wire, the aortic bifurcation was crossed and the catheter was placed into theright external iliac artery and right runoff was obtained.  left runoff was performed via  retrograde sheath injections.  Findings:   Aortogram: No significant renal artery stenosis was visualized.  The infrarenal abdominal aorta is widely patent.  Multiple stents within bilateral common and external iliac arteries are patent without hemodynamically significant stenosis.  Right Lower Extremity: Mild luminal narrowing in the right common femoral artery.  The profundofemoral arteries widely patent.  The previously placed stents within the superficial femoral artery are widely patent.  Just beyond the most distal stent, there is severe calcification with stenosis of greater than 60%.  There is three-vessel runoff.  Left Lower Extremity: Mild common femoral stenosis.  The profundofemoral artery is widely patent.  A femoral to above-knee popliteal artery bypass graft is widely patent without any visible stenosis  Intervention: After the above images were acquired the decision was made to proceed with intervention.  A 6 French catapulted sheath was placed into the right external iliac artery.  The patient was fully heparinized.  A 035 versa core wire was then advanced into the popliteal artery without significant difficulty.  I selected a 7 x 120 Eluvia stent and extended the previously placed stents with this.  It was postdilated with a 6 mm balloon.  Completion imaging showed resolution of the stenosis.  There is no change in runoff.  Catheters and wires were removed.  The long sheath was exchanged out for a short 6 French sheath and a minx was used for closure.  Impression:  #1  No significant stenosis was identified within the aortoiliac system  #2  Greater than 60% distal right superficial femoral artery stenosis  successfully treated using a 7 x 120 Eluvia stent  #3  Left femoral-popliteal bypass graft is widely patent   V. Annamarie Major, M.D., [facility] [place] Office: [phone] Pager:  [phone]

## 2023-03-05 DIAGNOSIS — G8929 Other chronic pain: Secondary | ICD-10-CM | POA: Diagnosis not present

## 2023-03-05 DIAGNOSIS — M961 Postlaminectomy syndrome, not elsewhere classified: Secondary | ICD-10-CM | POA: Diagnosis not present

## 2023-03-05 DIAGNOSIS — Z79891 Long term (current) use of opiate analgesic: Secondary | ICD-10-CM | POA: Diagnosis not present

## 2023-03-05 DIAGNOSIS — M5451 Vertebrogenic low back pain: Secondary | ICD-10-CM | POA: Diagnosis not present

## 2023-03-11 ENCOUNTER — Other Ambulatory Visit: Payer: Medicare Other

## 2023-04-01 ENCOUNTER — Ambulatory Visit: Payer: Medicare Other | Attending: Nurse Practitioner

## 2023-04-01 DIAGNOSIS — I5022 Chronic systolic (congestive) heart failure: Secondary | ICD-10-CM | POA: Diagnosis not present

## 2023-04-01 LAB — ECHOCARDIOGRAM COMPLETE
AR max vel: 2.53 cm2
AV Area VTI: 2.43 cm2
AV Area mean vel: 2.42 cm2
AV Mean grad: 3 mmHg
AV Peak grad: 5 mmHg
Ao pk vel: 1.12 m/s
Area-P 1/2: 3.08 cm2
Calc EF: 59.4 %
MV M vel: 3.89 m/s
MV Peak grad: 60.5 mmHg
S' Lateral: 2.7 cm
Single Plane A2C EF: 60.7 %
Single Plane A4C EF: 60 %

## 2023-04-02 LAB — CUP PACEART REMOTE DEVICE CHECK
Battery Remaining Longevity: 9 mo
Battery Remaining Percentage: 10 %
Battery Voltage: 2.68 V
Brady Statistic AP VP Percent: 36 %
Brady Statistic AP VS Percent: 1 %
Brady Statistic AS VP Percent: 62 %
Brady Statistic AS VS Percent: 1 %
Brady Statistic RA Percent Paced: 35 %
Date Time Interrogation Session: 20240507020015
HighPow Impedance: 74 Ohm
HighPow Impedance: 74 Ohm
Implantable Lead Connection Status: 753985
Implantable Lead Connection Status: 753985
Implantable Lead Connection Status: 753985
Implantable Lead Implant Date: 20160923
Implantable Lead Implant Date: 20160923
Implantable Lead Implant Date: 20160923
Implantable Lead Location: 753858
Implantable Lead Location: 753859
Implantable Lead Location: 753860
Implantable Lead Model: 7122
Implantable Pulse Generator Implant Date: 20160923
Lead Channel Impedance Value: 450 Ohm
Lead Channel Impedance Value: 480 Ohm
Lead Channel Impedance Value: 840 Ohm
Lead Channel Pacing Threshold Amplitude: 0.75 V
Lead Channel Pacing Threshold Amplitude: 0.75 V
Lead Channel Pacing Threshold Amplitude: 1.5 V
Lead Channel Pacing Threshold Pulse Width: 0.5 ms
Lead Channel Pacing Threshold Pulse Width: 0.5 ms
Lead Channel Pacing Threshold Pulse Width: 0.8 ms
Lead Channel Sensing Intrinsic Amplitude: 12 mV
Lead Channel Sensing Intrinsic Amplitude: 3.9 mV
Lead Channel Setting Pacing Amplitude: 2 V
Lead Channel Setting Pacing Amplitude: 2 V
Lead Channel Setting Pacing Amplitude: 2.5 V
Lead Channel Setting Pacing Pulse Width: 0.5 ms
Lead Channel Setting Pacing Pulse Width: 0.8 ms
Lead Channel Setting Sensing Sensitivity: 0.5 mV
Pulse Gen Serial Number: 7294918
Zone Setting Status: 755011

## 2023-04-03 ENCOUNTER — Ambulatory Visit (INDEPENDENT_AMBULATORY_CARE_PROVIDER_SITE_OTHER): Payer: Medicare Other

## 2023-04-03 DIAGNOSIS — I255 Ischemic cardiomyopathy: Secondary | ICD-10-CM | POA: Diagnosis not present

## 2023-04-04 ENCOUNTER — Telehealth: Payer: Self-pay | Admitting: Nurse Practitioner

## 2023-04-04 NOTE — Telephone Encounter (Signed)
Patient informed and verbalized understanding of plan. Copy sent to PCP 

## 2023-04-04 NOTE — Telephone Encounter (Signed)
-----   Message from Sharlene Dory, NP sent at 04/02/2023  5:02 PM EDT ----- Echo reviewed.   Normal heart pumping function.  Does have mild stiffening of left ventricle during relaxation phase, common finding in someone high blood pressure/ and with advancing age.  Mitral valve is minimally leaky, borderline dilatation of aortic root around 36 mm.  Recommend repeating echocardiogram in 1 year for monitoring.  Sharlene Dory, AGNP-C

## 2023-04-04 NOTE — Telephone Encounter (Signed)
  Pt is returning to get echo result

## 2023-04-24 NOTE — Progress Notes (Signed)
Remote ICD transmission.   

## 2023-05-16 ENCOUNTER — Other Ambulatory Visit: Payer: Self-pay | Admitting: *Deleted

## 2023-05-16 DIAGNOSIS — I70213 Atherosclerosis of native arteries of extremities with intermittent claudication, bilateral legs: Secondary | ICD-10-CM

## 2023-05-16 DIAGNOSIS — I739 Peripheral vascular disease, unspecified: Secondary | ICD-10-CM

## 2023-05-27 ENCOUNTER — Other Ambulatory Visit: Payer: Self-pay | Admitting: *Deleted

## 2023-05-27 ENCOUNTER — Ambulatory Visit (INDEPENDENT_AMBULATORY_CARE_PROVIDER_SITE_OTHER): Payer: Medicare Other | Admitting: Physician Assistant

## 2023-05-27 ENCOUNTER — Ambulatory Visit (HOSPITAL_COMMUNITY)
Admission: RE | Admit: 2023-05-27 | Discharge: 2023-05-27 | Disposition: A | Discharge status: Home / Self Care | Payer: Medicare Other | Source: Ambulatory Visit | Attending: Surgery | Admitting: Surgery

## 2023-05-27 ENCOUNTER — Encounter: Payer: Self-pay | Admitting: Physician Assistant

## 2023-05-27 ENCOUNTER — Ambulatory Visit (INDEPENDENT_AMBULATORY_CARE_PROVIDER_SITE_OTHER)
Admission: RE | Admit: 2023-05-27 | Discharge: 2023-05-27 | Disposition: A | Discharge status: Home / Self Care | Payer: Medicare Other | Source: Ambulatory Visit | Attending: Surgery | Admitting: Surgery

## 2023-05-27 VITALS — BP 141/67 | HR 60 | Temp 97.2°F | Resp 18 | Ht 63.0 in | Wt 145.1 lb

## 2023-05-27 DIAGNOSIS — I70213 Atherosclerosis of native arteries of extremities with intermittent claudication, bilateral legs: Secondary | ICD-10-CM | POA: Diagnosis not present

## 2023-05-27 DIAGNOSIS — I6522 Occlusion and stenosis of left carotid artery: Secondary | ICD-10-CM

## 2023-05-27 DIAGNOSIS — I739 Peripheral vascular disease, unspecified: Secondary | ICD-10-CM | POA: Insufficient documentation

## 2023-05-27 NOTE — Progress Notes (Signed)
Office Note   History of Present Illness   Roy [name] is a 77 y.o. (May 20, 1946) male who presents for follow up.  He recently underwent bilateral lower extremity angiogram with right SFA stenting on 02/19/2023 by Dr. Myra Gianotti.  During his angiogram no significant stenosis was identified in his aorta, iliacs, or left femoropopliteal bypass.  His prior procedures include: 1) bilateral external iliac artery stenting in 2018  2) left femoral to above-knee popliteal artery bypass graft with GSV in 2018  3) incision and drainage of distal incision seroma in 2018 4) right SFA atherectomy and drug-coated balloon angioplasty, right external iliac angioplasty in 2019  5) right SFA stenting and left common femoral drug-coated balloon angioplasty in 2021 6) right external iliac and femoral artery endarterectomy, right common iliac stenting, right SFA stenting in 2021 7) right external iliac artery stenting, left common femoral artery drug-coated balloon angioplasty in 2021  8) right SFA stenting and left external iliac artery angioplasty in 2022  9) left external iliac artery stenting, left common femoral artery drug-coated balloon angioplasty in 2023  He has history of TIA in 2009 with a right ICA stent.  He has an ICD in place.  He has a history of congestive heart failure and COPD.  He has a chronic smoker with no desire in quitting.  He currently smokes 1.5 PPD.  The patient returns today for follow up. He denies any recent medical history changes. The patient also denies any claudication, rest pain, or tissue loss of the lower extremities.  Current Outpatient Medications  Medication Sig Dispense Refill   acetaminophen (TYLENOL) 500 MG tablet Take 1,000 mg by mouth every 8 (eight) hours as needed for moderate pain or headache.     aspirin EC 81 MG tablet Take 81 mg by mouth every evening.      atorvastatin (LIPITOR) 20 MG tablet Take 20 mg by mouth every evening.     BRILINTA 90 MG TABS  tablet Take 90 mg by mouth 2 (two) times daily.     carvedilol (COREG) 6.25 MG tablet TAKE 1 TABLET(6.25 MG) BY MOUTH TWICE DAILY WITH A MEAL 180 tablet 3   cetirizine (ZYRTEC) 10 MG tablet Take 10 mg by mouth daily.     dextromethorphan-guaiFENesin (MUCINEX DM) 30-600 MG 12hr tablet Take 1 tablet by mouth daily.      gabapentin (NEURONTIN) 300 MG capsule Take 300 mg by mouth 2 (two) times daily.      HYDROcodone-acetaminophen (NORCO/VICODIN) 5-325 MG tablet Take 2 tablets by mouth every 6 (six) hours as needed for severe pain.     magnesium oxide (MAG-OX) 400 MG tablet Take 400 mg by mouth every evening.     Multiple Vitamins-Minerals (CENTRUM SILVER PO) Take 1 tablet by mouth daily. 50+     naphazoline-pheniramine (VISINE) 0.025-0.3 % ophthalmic solution Place 1 drop into both eyes daily as needed for eye irritation (itching). Allergy eye     Omega-3 Fatty Acids (FISH OIL) 1000 MG CAPS Take 1,000 mg by mouth 2 (two) times daily.     sacubitril-valsartan (ENTRESTO) 97-103 MG Take 1 tablet by mouth 2 (two) times daily. 180 tablet 3   Salicylic Acid (GOLD BOND PSORIASIS RELIEF) 3 % CREA Apply 1 application topically daily as needed (psoriasis).     silodosin (RAPAFLO) 8 MG CAPS capsule Take 8 mg by mouth daily with breakfast.     SUPER B COMPLEX/C PO Take 1 tablet by mouth every evening.  tamsulosin (FLOMAX) 0.4 MG CAPS capsule Take 0.4 mg by mouth daily after supper.     traMADol (ULTRAM) 50 MG tablet Take 50 mg by mouth daily as needed for moderate pain.      No current facility-administered medications for this visit.    REVIEW OF SYSTEMS (negative unless checked):   Cardiac:  []  Chest pain or chest pressure? []  Shortness of breath upon activity? []  Shortness of breath when lying flat? []  Irregular heart rhythm?  Vascular:  []  Pain in calf, thigh, or hip brought on by walking? []  Pain in feet at night that wakes you up from your sleep? []  Blood clot in your veins? []  Leg  swelling?  Pulmonary:  []  Oxygen at home? []  Productive cough? []  Wheezing?  Neurologic:  []  Sudden weakness in arms or legs? []  Sudden numbness in arms or legs? []  Sudden onset of difficult speaking or slurred speech? []  Temporary loss of vision in one eye? []  Problems with dizziness?  Gastrointestinal:  []  Blood in stool? []  Vomited blood?  Genitourinary:  []  Burning when urinating? []  Blood in urine?  Psychiatric:  []  Major depression  Hematologic:  []  Bleeding problems? []  Problems with blood clotting?  Dermatologic:  []  Rashes or ulcers?  Constitutional:  []  Fever or chills?  Ear/Nose/Throat:  []  Change in hearing? []  Nose bleeds? []  Sore throat?  Musculoskeletal:  []  Back pain? []  Joint pain? []  Muscle pain?   Physical Examination   Vitals:   05/27/23 0955  BP: (!) 141/67  Pulse: 60  Resp: 18  Temp: (!) 97.2 F (36.2 C)  TempSrc: Temporal  SpO2: 97%  Weight: 145 lb 1.6 oz (65.8 kg)  Height: 5\' 3"  (1.6 m)   Body mass index is 25.7 kg/m.  General:  WDWN in NAD; vital signs documented above Gait: Not observed HENT: WNL, normocephalic Pulmonary: normal non-labored breathing , without rales, rhonchi,  wheezing Cardiac: regular Abdomen: soft, NT, no masses Skin: without rashes Vascular Exam/Pulses: Palpable DP pulses bilaterally Extremities: without ischemic changes, without gangrene , without cellulitis; without open wounds;  Musculoskeletal: no muscle wasting or atrophy  Neurologic: A&O X 3;  No focal weakness or paresthesias are detected Psychiatric:  The pt has Normal affect.  Non-Invasive Vascular imaging  Aortoiliac Duplex (05/27/2023) Patent right common iliac artery stent without stenosis.  Patent bilateral external iliac artery stents without hemodynamically significant stenosis.  BLE Arterial Duplex (05/27/2023) Patent flow in the right lower extremity without hemodynamically significant stenosis. 50% stenosis in the distal common  femoral artery, unchanged from prior duplex.  Patent right SFA stent without hemodynamically significant stenosis.  50% stenosis of the proximal stent with PSV 317 cm/s.  Patent flow in the left lower extremity without hemodynamically significant stenosis.  Patent left femoropopliteal bypass without hemodynamically significant stenosis.  50% inflow stenosis to the bypass, unchanged from prior duplex.  Medical Decision Making   DEMYAN HERDEGEN is a 77 y.o. male who presents for surveillance of PAD  Based on aortoiliac duplex, there are patent stents in the right common iliac artery and bilateral external iliac arteries without hemodynamically significant stenosis Arterial duplex of bilateral lower extremities demonstrates unchanged 50% stenosis of the distal right common femoral artery.  There is a patent right SFA stent.  There is patent flow in the left left lower extremity bypass with unchanged inflow stenosis of 50% The patient denies any claudication, rest pain, or tissue loss.  He has palpable DP pulses bilaterally He can follow-up with our  office in 6 months with ABIs, bilateral lower extremity arterial duplex, and aortoiliac duplex  Loel Dubonnet PA-C [facility] [place] Office: [phone]  Clinic MD: Myra Gianotti

## 2023-06-06 DIAGNOSIS — Z125 Encounter for screening for malignant neoplasm of prostate: Secondary | ICD-10-CM | POA: Diagnosis not present

## 2023-06-06 DIAGNOSIS — E782 Mixed hyperlipidemia: Secondary | ICD-10-CM | POA: Diagnosis not present

## 2023-06-06 DIAGNOSIS — R7303 Prediabetes: Secondary | ICD-10-CM | POA: Diagnosis not present

## 2023-06-08 ENCOUNTER — Other Ambulatory Visit: Payer: Self-pay

## 2023-06-08 DIAGNOSIS — I739 Peripheral vascular disease, unspecified: Secondary | ICD-10-CM

## 2023-06-08 DIAGNOSIS — I70213 Atherosclerosis of native arteries of extremities with intermittent claudication, bilateral legs: Secondary | ICD-10-CM

## 2023-06-12 DIAGNOSIS — E875 Hyperkalemia: Secondary | ICD-10-CM | POA: Diagnosis not present

## 2023-06-12 DIAGNOSIS — I739 Peripheral vascular disease, unspecified: Secondary | ICD-10-CM | POA: Diagnosis not present

## 2023-06-12 DIAGNOSIS — G8929 Other chronic pain: Secondary | ICD-10-CM | POA: Diagnosis not present

## 2023-06-12 DIAGNOSIS — F17200 Nicotine dependence, unspecified, uncomplicated: Secondary | ICD-10-CM | POA: Diagnosis not present

## 2023-06-12 DIAGNOSIS — M545 Low back pain, unspecified: Secondary | ICD-10-CM | POA: Diagnosis not present

## 2023-06-12 DIAGNOSIS — R7303 Prediabetes: Secondary | ICD-10-CM | POA: Diagnosis not present

## 2023-06-12 DIAGNOSIS — K7460 Unspecified cirrhosis of liver: Secondary | ICD-10-CM | POA: Diagnosis not present

## 2023-06-12 DIAGNOSIS — I5022 Chronic systolic (congestive) heart failure: Secondary | ICD-10-CM | POA: Diagnosis not present

## 2023-06-12 DIAGNOSIS — I10 Essential (primary) hypertension: Secondary | ICD-10-CM | POA: Diagnosis not present

## 2023-06-12 DIAGNOSIS — E782 Mixed hyperlipidemia: Secondary | ICD-10-CM | POA: Diagnosis not present

## 2023-06-12 DIAGNOSIS — I110 Hypertensive heart disease with heart failure: Secondary | ICD-10-CM | POA: Diagnosis not present

## 2023-06-12 DIAGNOSIS — D509 Iron deficiency anemia, unspecified: Secondary | ICD-10-CM | POA: Diagnosis not present

## 2023-06-18 ENCOUNTER — Encounter: Payer: Self-pay | Admitting: Nurse Practitioner

## 2023-06-18 ENCOUNTER — Ambulatory Visit: Payer: Medicare Other | Attending: Nurse Practitioner | Admitting: Nurse Practitioner

## 2023-06-18 VITALS — BP 128/70 | HR 61 | Ht 63.0 in | Wt 143.2 lb

## 2023-06-18 DIAGNOSIS — I739 Peripheral vascular disease, unspecified: Secondary | ICD-10-CM

## 2023-06-18 DIAGNOSIS — Z95810 Presence of automatic (implantable) cardiac defibrillator: Secondary | ICD-10-CM

## 2023-06-18 DIAGNOSIS — I5032 Chronic diastolic (congestive) heart failure: Secondary | ICD-10-CM | POA: Diagnosis not present

## 2023-06-18 DIAGNOSIS — E785 Hyperlipidemia, unspecified: Secondary | ICD-10-CM | POA: Diagnosis not present

## 2023-06-18 DIAGNOSIS — I77819 Aortic ectasia, unspecified site: Secondary | ICD-10-CM

## 2023-06-18 DIAGNOSIS — I2510 Atherosclerotic heart disease of native coronary artery without angina pectoris: Secondary | ICD-10-CM | POA: Diagnosis not present

## 2023-06-18 DIAGNOSIS — I10 Essential (primary) hypertension: Secondary | ICD-10-CM | POA: Diagnosis not present

## 2023-06-18 DIAGNOSIS — I255 Ischemic cardiomyopathy: Secondary | ICD-10-CM

## 2023-06-18 DIAGNOSIS — I5020 Unspecified systolic (congestive) heart failure: Secondary | ICD-10-CM

## 2023-06-18 DIAGNOSIS — Z720 Tobacco use: Secondary | ICD-10-CM | POA: Diagnosis not present

## 2023-06-18 NOTE — Patient Instructions (Addendum)
Medication Instructions:  Your physician recommends that you continue on your current medications as directed. Please refer to the Current Medication list given to you today.  Labwork: None  Testing/Procedures: None  Follow-Up: Your physician recommends that you schedule a follow-up appointment in: 6 Months with Dr.Branch  Any Other Special Instructions Will Be Listed Below (If Applicable).  If you need a refill on your cardiac medications before your next appointment, please call your pharmacy.

## 2023-06-18 NOTE — Progress Notes (Signed)
Cardiology Office Note:    Date: 06/18/2023 ID:  [name], DOB [birth year], MRN [id number] PCP:  Benita Stabile, MD [facility] Providers Cardiologist:  Dina Rich, MD Electrophysiologist:  Lewayne Bunting, MD  Referring MD: Benita Stabile, MD   CC: Here for follow-up  History of Present Illness:    Roy [name] is a very pleasant 77 y.o. male with a PMH of CAD, s/p NSTEMI, HFimpEF, s/p BiV ICD, HLD, HTN, cerebrovascular disease/TIA, s/p hx of ICA stent, PAD, s/p recent SFA stenting on 02/19/2023, tobacco abuse (has been smoking since 77 years old), former alcoholic, aortic dilatation, and COPD, who presents today for follow-up. Followed by [facility] for hx of PAD.   History of NSTEMI 2015 in the setting of respiratory failure and pneumonia.  EF at that time was 20%.  Cardiac catheterization was not pursued due to renal dysfunction and AMS.  Subsequent stress imaging supported scar.  Has been on DAPT for history of ICA stenting due to cerebrovascular disease.  In 2018 EF improved to 50 to 55% after ICD was placed, no wall motion abnormalities.  Underwent bilateral lower extremity angiogram with right SFA stenting on February 19, 2023 by Dr. Myra Gianotti. During angiogram, no significant stenosis was found along iliacs, aorta, or left femoropopliteal bypass.   Today he presents for follow-up. Doing well. Denies any chest pain, shortness of breath, palpitations, syncope, presyncope, dizziness, orthopnea, PND, swelling or significant weight changes, acute bleeding, or claudication. Continues to smoke. Shows me BP log that overall shows well controlled blood pressures.   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  ROS:    Please see the history of present illness.    All other systems reviewed and are negative.  EKGs/Labs/Other Studies Reviewed:    The following studies were reviewed today:   EKG:   EKG Interpretation Date/Time:  Tuesday June 18 2023 09:36:28 EDT Ventricular Rate:  61 PR  Interval:  144 QRS Duration:  174 QT Interval:  468 QTC Calculation: 471 R Axis:   -85  Text Interpretation: AV dual-paced rhythm When compared with ECG of 22-Jul-2019 06:49, No significant change was found Confirmed by Sharlene Dory [phone]) on 06/18/2023 9:41:28 AM     Echocardiogram 03/2023: 1. Left ventricular ejection fraction, by estimation, is 55 to 60%. The  left ventricle has normal function. The left ventricle has no regional  wall motion abnormalities. Left ventricular diastolic parameters are  consistent with Grade I diastolic  dysfunction (impaired relaxation). The average left ventricular global  longitudinal strain is -21.6 %. The global longitudinal strain is normal.   2. Right ventricular systolic function was not well visualized. The right  ventricular size is normal. There is normal pulmonary artery systolic  pressure.   3. The mitral valve is normal in structure. Trivial mitral valve  regurgitation. No evidence of mitral stenosis.   4. The aortic valve was not well visualized. Aortic valve regurgitation  is not visualized. No aortic stenosis is present.   5. Aortic dilatation noted. There is borderline dilatation of the aortic  root, measuring 36 mm.   6. The inferior vena cava is normal in size with greater than 50%  respiratory variability, suggesting right atrial pressure of 3 mmHg.   Comparison(s): Echocardiogram done 08/11/17 showed an EF of 50-55%.  Myoview on 08/30/2014: IMPRESSION:  1. Large degree of inferior myocardial scar. No evidence of  ischemia. Mild LV dilatation.   2.  Inferior wall akinesis.   3. Left ventricular ejection  fraction 18%   4. High-risk stress test findings* based on severely depressed EF  and degree of myocardial scar.  Physical Exam:    VS:  BP 128/70   Pulse 61   Ht 5\' 3"  (1.6 m)   Wt 143 lb 3.2 oz (65 kg)   SpO2 98%   BMI 25.37 kg/m     Wt Readings from Last 3 Encounters:  06/18/23 143 lb 3.2 oz (65 kg)   05/27/23 145 lb 1.6 oz (65.8 kg)  02/19/23 151 lb (68.5 kg)     GEN: Well nourished, well developed in no acute distress HEENT: Normal NECK: No JVD; No carotid bruits CARDIAC: S1/S2, RRR, no murmurs, rubs, gallops; 2+ pulses RESPIRATORY:  Clear to auscultation without rales, wheezing or rhonchi  MUSCULOSKELETAL:  No edema; No deformity  SKIN: Warm and dry NEUROLOGIC:  Alert and oriented x 3 PSYCHIATRIC:  Normal affect   ASSESSMENT:    1. Coronary artery disease involving native coronary artery of native heart without angina pectoris   2. Hyperlipidemia, unspecified hyperlipidemia type   3. PAD (peripheral artery disease) (HCC)   4. Heart failure with improved ejection fraction (HFimpEF) (HCC)   5. Biventricular ICD (implantable cardioverter-defibrillator) in place   6. Cardiomyopathy, ischemic   7. Essential hypertension, benign   8. Tobacco abuse   9. Aortic dilatation (HCC)     PLAN:    In order of problems listed above:  CAD, s/p NSTEMI Stable with no anginal symptoms. No indication for ischemic evaluation. Continue current medication regimen. Heart healthy diet and regular cardiovascular exercise encouraged. Smoking cessation encouraged and discussed - see below.   HLD Most recent LDL 44 11/2022. Being managed by PCP.  Continue current medication regimen. Heart healthy diet and regular cardiovascular exercise encouraged.   PAD Longstanding history. He recent underwent bilateral lower extremity angiogram and is s/p right SFA stenting on February 19, 2023 by Dr. Myra Gianotti. Continue current medication regimen.  Continue to follow-up with Dr. Myra Gianotti.   HFimpEF, s/p BiV ICD, ICM Stage C, NYHA class I-II symptoms. TTE 03/2023 showed EF 55-60%. Euvolemic and well compensated on exam.  Continue aspirin, Coreg, Lipitor, carvedilol, and Entresto. Low sodium diet, fluid restriction <2L, and daily weights encouraged. Educated to contact our office for weight gain of 2 lbs overnight or 5  lbs in one week. Normal device function 03/2023 seen during remote device check. Continue to follow up with EP.   5. HTN Blood pressure stable. Discussed to monitor BP at home at least 2 hours after medications and sitting for 5-10 minutes.  Discussed to obtain OMRON cuff and to notify office is SBP is consistently elevated > 140. No medication changes at this time. Low salt, heart healthy diet and regular cardiovascular exercise encouraged.   6.  Tobacco abuse Smoking cessation encouraged and discussed.  He verbalized understanding.  7. Aortic dilatation Borderline dilatation of aortic root measuring 36 mm noted on echocardiogram 03/2023.  Recommend repeating echocardiogram in 1 year for monitoring.  Care and ED precautions discussed.  8. Disposition: Follow-up with Dr. Dina Rich or APP in 6 months or sooner if anything changes.  Medication Adjustments/Labs and Tests Ordered: Current medicines are reviewed at length with the patient today.  Concerns regarding medicines are outlined above.  Orders Placed This Encounter  Procedures   EKG 12-Lead   No orders of the defined types were placed in this encounter.   Patient Instructions  Medication Instructions:  Your physician recommends that you continue on  your current medications as directed. Please refer to the Current Medication list given to you today.  Labwork: None  Testing/Procedures: None  Follow-Up: Your physician recommends that you schedule a follow-up appointment in: 6 Months with Dr.Branch  Any Other Special Instructions Will Be Listed Below (If Applicable).  If you need a refill on your cardiac medications before your next appointment, please call your pharmacy.   Signed, Sharlene Dory, NP  06/18/2023 5:11 PM    [facility]

## 2023-06-27 ENCOUNTER — Encounter: Payer: Self-pay | Admitting: Internal Medicine

## 2023-06-27 ENCOUNTER — Ambulatory Visit: Payer: Medicare Other | Attending: Internal Medicine | Admitting: Internal Medicine

## 2023-06-27 VITALS — BP 128/72 | HR 60 | Ht 63.0 in | Wt 142.2 lb

## 2023-06-27 DIAGNOSIS — I5022 Chronic systolic (congestive) heart failure: Secondary | ICD-10-CM | POA: Diagnosis not present

## 2023-06-27 NOTE — Patient Instructions (Signed)
Medication Instructions:  Your physician recommends that you continue on your current medications as directed. Please refer to the Current Medication list given to you today.  *If you need a refill on your cardiac medications before your next appointment, please call your pharmacy*   Lab Work: NONE   If you have labs (blood work) drawn today and your tests are completely normal, you will receive your results only by: MyChart Message (if you have MyChart) OR A paper copy in the mail If you have any lab test that is abnormal or we need to change your treatment, we will call you to review the results.   Testing/Procedures: NONE    Follow-Up: At [facility], you and your health needs are our priority.  As part of our continuing mission to provide you with exceptional heart care, we have created designated Provider Care Teams.  These Care Teams include your primary Cardiologist (physician) and Advanced Practice Providers (APPs -  Physician Assistants and Nurse Practitioners) who all work together to provide you with the care you need, when you need it.  We recommend signing up for the patient portal called "MyChart".  Sign up information is provided on this After Visit Summary.  MyChart is used to connect with patients for Virtual Visits (Telemedicine).  Patients are able to view lab/test results, encounter notes, upcoming appointments, etc.  Non-urgent messages can be sent to your provider as well.   To learn more about what you can do with MyChart, go to [email or web address].    Your next appointment:   8 month(s)  Provider:   Lewayne Bunting, MD    Other Instructions Thank you for choosing [facility]!

## 2023-06-27 NOTE — Progress Notes (Signed)
HPI [name] returns today for followup. He is a pleasant 77 yo man with a h/o chronic systolic heart failure, s/p biv ICD insertion. He has had some noise on his ICD lead and we have reprogrammed his device. He has not had syncope. When we saw him last 13 months ago, we gave him a magnet to keep with him. He has not had chest pain or sob.    No Known Allergies   Current Outpatient Medications  Medication Sig Dispense Refill   acetaminophen (TYLENOL) 500 MG tablet Take 1,000 mg by mouth every 8 (eight) hours as needed for moderate pain or headache.     aspirin EC 81 MG tablet Take 81 mg by mouth every evening.      atorvastatin (LIPITOR) 20 MG tablet Take 20 mg by mouth every evening.     BRILINTA 90 MG TABS tablet Take 90 mg by mouth 2 (two) times daily.     carvedilol (COREG) 6.25 MG tablet TAKE 1 TABLET(6.25 MG) BY MOUTH TWICE DAILY WITH A MEAL 180 tablet 3   cetirizine (ZYRTEC) 10 MG tablet Take 10 mg by mouth daily.     dextromethorphan-guaiFENesin (MUCINEX DM) 30-600 MG 12hr tablet Take 1 tablet by mouth 2 (two) times daily.     gabapentin (NEURONTIN) 300 MG capsule Take 300 mg by mouth 2 (two) times daily.      HYDROcodone-acetaminophen (NORCO/VICODIN) 5-325 MG tablet Take 2 tablets by mouth every 6 (six) hours as needed for severe pain.     magnesium oxide (MAG-OX) 400 MG tablet Take 400 mg by mouth every evening.     Multiple Vitamins-Minerals (CENTRUM SILVER PO) Take 1 tablet by mouth daily. 50+     naphazoline-pheniramine (VISINE) 0.025-0.3 % ophthalmic solution Place 1 drop into both eyes daily as needed for eye irritation (itching). Allergy eye     Omega-3 Fatty Acids (FISH OIL) 1000 MG CAPS Take 1,000 mg by mouth 2 (two) times daily.     sacubitril-valsartan (ENTRESTO) 97-103 MG Take 1 tablet by mouth 2 (two) times daily. 180 tablet 3   Salicylic Acid (GOLD BOND PSORIASIS RELIEF) 3 % CREA Apply 1 application topically daily as needed (psoriasis).     silodosin  (RAPAFLO) 8 MG CAPS capsule Take 8 mg by mouth daily with breakfast.     SUPER B COMPLEX/C PO Take 1 tablet by mouth every evening.     tamsulosin (FLOMAX) 0.4 MG CAPS capsule Take 0.4 mg by mouth daily after supper.     traMADol (ULTRAM) 50 MG tablet Take 50 mg by mouth daily as needed for moderate pain.      No current facility-administered medications for this visit.     Past Medical History:  Diagnosis Date   AICD (automatic cardioverter/defibrillator) present 08/19/2015   [employer] Jude BiV ICD for primary prevention by Dr. Ladona Ridgel   Alcohol abuse    6 beers per day; hospital admission in 2009 for withdrawal symptoms   Alcoholic cirrhosis (HCC)    Anxiety and depression    denies    Cerebrovascular disease 2009   TIA; 2009- right ICA stent; re-intervention for restenosis complicated by [facility] w/o sx   CHF (congestive heart failure) (HCC) 06-02-14   Chronic obstructive pulmonary disease (HCC)    Degenerative joint disease    Total shoulder arthroplasty-right   Hyperlipidemia    Hypertension    LBBB (left bundle branch block)    Normal echo-2011; stress nuclear in 09/2010--septal hypoperfusion representing  nontransmural infarction or the effect of left bundle branch block, no ischemia   Myocardial infarction [facility]) June 02, 2014   Massive Heart Attack   Peripheral vascular disease (HCC)    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   Thrombocytopenia (HCC)    Tobacco abuse    -100 pack years; 1.5 packs per day   Traumatic seroma of thigh (HCC)    left   Tubular adenoma of colon     ROS:   All systems reviewed and negative except as noted in the HPI.   Past Surgical History:  Procedure Laterality Date   ABDOMINAL AORTAGRAM [name] 05/04/2014   Procedure: ABDOMINAL AORTAGRAM;  Surgeon: Nada Libman, MD;  Location: [facility] CATH LAB;  Service: Cardiovascular;  Laterality: [name];   ABDOMINAL AORTOGRAM [name] 06/25/2017   Procedure: Abdominal Aortogram;  Surgeon: Nada Libman, MD;   Location: [facility] INVASIVE CV LAB;  Service: Cardiovascular;  Laterality: [name];   ABDOMINAL AORTOGRAM W/LOWER EXTREMITY [name] 04/22/2018   Procedure: ABDOMINAL AORTOGRAM W/LOWER EXTREMITY;  Surgeon: Nada Libman, MD;  Location: [facility] INVASIVE CV LAB;  Service: Cardiovascular;  Laterality: [name];   ABDOMINAL AORTOGRAM W/LOWER EXTREMITY [name] 03/08/2020   Procedure: ABDOMINAL AORTOGRAM W/LOWER EXTREMITY;  Surgeon: Nada Libman, MD;  [facility]: [facility] CV LAB;  Service: Cardiovascular;  Laterality: [name];   ABDOMINAL AORTOGRAM W/LOWER EXTREMITY Bilateral 08/23/2020   Procedure: ABDOMINAL AORTOGRAM W/LOWER EXTREMITY;  Surgeon: Nada Libman, MD;  [facility] LAB;  Service: Cardiovascular;  Laterality: Bilateral;   ABDOMINAL AORTOGRAM W/LOWER EXTREMITY [name] 10/31/2021   Procedure: ABDOMINAL AORTOGRAM W/LOWER EXTREMITY;  Surgeon: Nada Libman, MD;  [facility];  Service: Cardiovascular;  Laterality: [name];   ABDOMINAL AORTOGRAM W/LOWER EXTREMITY [name] 12/19/2021   Procedure: ABDOMINAL AORTOGRAM W/LOWER EXTREMITY;  Surgeon: Nada Libman, MD;  [facility];  Service: Cardiovascular;  Laterality: [name];   ABDOMINAL AORTOGRAM W/LOWER EXTREMITY Bilateral 02/19/2023   Procedure: ABDOMINAL AORTOGRAM W/LOWER EXTREMITY;  Surgeon: Nada Libman, MD;  [facility];  Service: Cardiovascular;  Laterality: Bilateral;   AGILE CAPSULE [name] 03/18/2014   Procedure: AGILE CAPSULE;  Surgeon: West Bali, MD;  Location: [facility];  Service: Endoscopy;  Laterality: [name];  7:30   APPLICATION OF WOUND VAC Left 09/03/2017   Procedure: APPLICATION OF WOUND VAC;  Surgeon: Fransisco Hertz, MD;  Location: [facility] OR;  Service: Vascular;  Laterality: Left;   APPLICATION OF WOUND VAC Right 04/07/2020   Procedure: PLACEMENT OF ANTIBIOTIC BEADS AND APPLICATION OF WOUND VAC;  Surgeon: Nada Libman, MD;  [facility] OR;  Service: Vascular;  Laterality: Right;   BACK SURGERY      BACTERIAL OVERGROWTH TEST [name] 05/24/2015   Procedure: BACTERIAL OVERGROWTH TEST;  Surgeon: West Bali, MD;  [facility]: [facility];  Service: Endoscopy;  Laterality: [name];  0700   BI-VENTRICULAR IMPLANTABLE CARDIOVERTER DEFIBRILLATOR  (CRT-D)  08/19/2015   CARPAL TUNNEL RELEASE Left 02/02/2016   Procedure: LEFT CARPAL TUNNEL RELEASE;  Surgeon: Cindee Salt, MD;  Location: [facility];  Service: Orthopedics;  Laterality: Left;  ANESTHESIA: IV REGIONAL UPPER ARM   CATARACT EXTRACTION W/PHACO Right 03/08/2015   Procedure: CATARACT EXTRACTION PHACO AND INTRAOCULAR LENS PLACEMENT (IOC);  Surgeon: Jethro Bolus, MD;  Location: [facility] ORS;  Service: Ophthalmology;  Laterality: Right;  CDE:9.46   CATARACT EXTRACTION W/PHACO Left 03/22/2015   Procedure: CATARACT EXTRACTION PHACO AND INTRAOCULAR LENS PLACEMENT (IOC);  Surgeon: Jethro Bolus, MD;  Location: [facility];  Service: Ophthalmology;  Laterality: Left;  CDE:5.80   COLONOSCOPY  08/22/09   Fields-(Tubular Adenoma)3-mm transverse polyp/4-mm polyp otherwise noraml/small internal hemorrhoids   COLONOSCOPY [name] 04/14/2014   ZOX:[id number] internal hemorrhids/normal mocsa in the terminal iluem/left colonis redundant   COLONOSCOPY W/ POLYPECTOMY  2011   ENDARTERECTOMY FEMORAL Left 08/16/2017   Procedure: ENDARTERECTOMY LEFT PROFUNDA FEMORAL;  Surgeon: Nada Libman, MD;  Location: [facility] OR;  Service: Vascular;  Laterality: Left;   ENDARTERECTOMY FEMORAL Right 03/31/2020   Procedure: RIGHT FEMORAL ENDARTERECTOMY;  Surgeon: Nada Libman, MD;  [facility] OR;  Service: Vascular;  Laterality: Right;   EP IMPLANTABLE DEVICE [name] 08/19/2015   Procedure: BiV ICD Insertion CRT-D;  Surgeon: Marinus Maw, MD;  Location: [facility] INVASIVE CV LAB;  Service: Cardiovascular;  Laterality: [name];   ESOPHAGOGASTRODUODENOSCOPY [name] 03/05/2014   SLF: 1. Stricture at the gastroesophagael junction 2. Small hiatal hernia 3. Moderate non-erosive gastritis and duodentitis. 4. No surce for  Melena identified.    FEMORAL-POPLITEAL BYPASS GRAFT Left 08/16/2017   Procedure: LEFT  FEMORAL-POPLITEAL ARTERY  BYPASS GRAFT;  Surgeon: Nada Libman, MD;  [facility];  Service: Vascular;  Laterality: Left;   GIVENS CAPSULE STUDY [name] 03/30/2014   Procedure: GIVENS CAPSULE STUDY;  Surgeon: West Bali, MD;  [facility];  Service: Endoscopy;  Laterality: [name];  7:30   GROIN DEBRIDEMENT Right 04/07/2020   Procedure: INCISION AND DRAINAGE OF RIGHT GROIN;  Surgeon: Nada Libman, MD;  [facility]: [facility];  Service: Vascular;  Laterality: Right;   I & D EXTREMITY Left 09/03/2017   Procedure: IRRIGATION AND DEBRIDEMENT EXTREMITY LEFT THIGH SEROMA;  Surgeon: Fransisco Hertz, MD;  [facility]: [facility];  Service: Vascular;  Laterality: Left;   INSERT / REPLACE / REMOVE PACEMAKER     INSERTION OF ILIAC STENT  03/31/2020   Procedure: INSERTION OF RIGHT ILIAC ARTERY STENT AND RIGHT SUPERFICIAL FEMORAL ARTERY STENT;  Surgeon: Nada Libman, MD;  [facility];  Service: Vascular;;   IR ANGIO INTRA EXTRACRAN SEL COM CAROTID INNOMINATE BILAT MOD SED  07/16/2017   IR ANGIO INTRA EXTRACRAN SEL COM CAROTID INNOMINATE BILAT MOD SED  06/12/2019   IR ANGIO INTRA EXTRACRAN SEL COM CAROTID INNOMINATE BILAT MOD SED  01/17/2021   IR ANGIO VERTEBRAL SEL SUBCLAVIAN INNOMINATE BILAT MOD SED  07/16/2017   IR ANGIO VERTEBRAL SEL SUBCLAVIAN INNOMINATE BILAT MOD SED  06/12/2019   IR ANGIO VERTEBRAL SEL SUBCLAVIAN INNOMINATE BILAT MOD SED  01/17/2021   IR RADIOLOGIST EVAL & MGMT  04/01/2017   IR RADIOLOGIST EVAL & MGMT  08/02/2017   IR TRANSCATH EXCRAN VERT OR CAR A STENT  07/22/2019   IR US GUIDE VASC ACCESS RIGHT  06/12/2019   IR US GUIDE VASC ACCESS RIGHT  01/17/2021   JOINT REPLACEMENT Right    Total Shoulder Replacement    LOWER EXTREMITY ANGIOGRAPHY Bilateral 06/25/2017   Procedure: Lower Extremity Angiography;  Surgeon: Nada Libman, MD;  [facility];  Service: Cardiovascular;  Laterality:  Bilateral;   LUMBAR FUSION  2010   PATCH ANGIOPLASTY Right 03/31/2020   Procedure: PATCH ANGIOPLASTY USING XENOSURE BIOLOGIC PATCH 1cm x 14cm;  Surgeon: Nada Libman, MD;  Location: [facility] OR;  Service: Vascular;  Laterality: Right;   PERIPHERAL VASCULAR ATHERECTOMY Right 04/22/2018   Procedure: PERIPHERAL VASCULAR ATHERECTOMY;  Surgeon: Nada Libman, MD;  [facility];  Service: Cardiovascular;  Laterality: Right;  right superficial  femoral   PERIPHERAL VASCULAR BALLOON ANGIOPLASTY Right 04/22/2018   Procedure: PERIPHERAL VASCULAR BALLOON ANGIOPLASTY;  Surgeon: Nada Libman, MD;  [facility];  Service: Cardiovascular;  Laterality: Right;  external iliac   PERIPHERAL VASCULAR BALLOON ANGIOPLASTY Left 03/08/2020   Procedure: PERIPHERAL VASCULAR BALLOON ANGIOPLASTY;  Surgeon: Nada Libman, MD;  [facility];  Service: Cardiovascular;  Laterality: Left;  Common Femoral   PERIPHERAL VASCULAR BALLOON ANGIOPLASTY Left 08/23/2020   Procedure: PERIPHERAL VASCULAR BALLOON ANGIOPLASTY;  Surgeon: Nada Libman, MD;  [facility];  Service: Cardiovascular;  Laterality: Left;  common femoral   PERIPHERAL VASCULAR BALLOON ANGIOPLASTY Left 10/31/2021   Procedure: PERIPHERAL VASCULAR BALLOON ANGIOPLASTY;  Surgeon: Nada Libman, MD;  [facility];  Service: Cardiovascular;  Laterality: Left;  external iliac   PERIPHERAL VASCULAR INTERVENTION Right 03/08/2020   Procedure: PERIPHERAL VASCULAR INTERVENTION;  Surgeon: Nada Libman, MD;  [facility];  Service: Cardiovascular;  Laterality: Right;  SFA   PERIPHERAL VASCULAR INTERVENTION Right 08/23/2020   Procedure: PERIPHERAL VASCULAR INTERVENTION;  Surgeon: Nada Libman, MD;  [facility];  Service: Cardiovascular;  Laterality: Right;  external iliac   PERIPHERAL VASCULAR INTERVENTION Right 10/31/2021   Procedure: PERIPHERAL VASCULAR  INTERVENTION;  Surgeon: Nada Libman, MD;  [facility];  Service: Cardiovascular;  Laterality: Right;  superficial femoral artery   PERIPHERAL VASCULAR INTERVENTION Left 12/19/2021   Procedure: PERIPHERAL VASCULAR INTERVENTION;  Surgeon: Nada Libman, MD;  [facility];  Service: Cardiovascular;  Laterality: Left;  stent lt ext iliac / pta common femoral   PERIPHERAL VASCULAR INTERVENTION  02/19/2023   Procedure: PERIPHERAL VASCULAR INTERVENTION;  Surgeon: Nada Libman, MD;  [facility];  Service: Cardiovascular;;   RADIOLOGY WITH ANESTHESIA [name] 07/01/2019   Procedure: STENTING;  Surgeon: Julieanne Cotton, MD;  [facility];  Service: Radiology;  Laterality: [name];   RADIOLOGY WITH ANESTHESIA [name] 07/22/2019   Procedure: STENTING;  Surgeon: Julieanne Cotton, MD;  [facility];  Service: Radiology;  Laterality: [name];   TOTAL SHOULDER ARTHROPLASTY Right 2011   Dr. Trudee Kuster NERVE TRANSPOSITION Left 02/02/2016   Procedure: LEFT IN-SITU DECOMPRESSION ULNAR NERVE ;  Surgeon: Cindee Salt, MD;  Location: [facility];  Service: Orthopedics;  Laterality: Left;   ULNAR TUNNEL RELEASE Left 02/02/2016   Procedure: LEFT CUBITAL TUNNEL RELEASE;  Surgeon: Cindee Salt, MD;  Location: [facility];  Service: Orthopedics;  Laterality: Left;   VASECTOMY  1971   VEIN HARVEST Left 08/16/2017   Procedure: USING NON REVERSE LEFT GREATER SAPHENOUS VEIN HARVEST;  Surgeon: Nada Libman, MD;  [facility];  Service: Vascular;  Laterality: Left;     Family History  Problem Relation Age of Onset   Coronary artery disease Mother    Diabetes Mother    Heart disease Mother        Before age 50 - 5 Bypasses   Hypertension Mother    Heart attack Mother        3-4 Heart attacks   Alzheimer's disease Father    Diabetes Father    Heart attack Sister    Cancer Brother        Prostate   Hyperlipidemia Son    Prostate cancer  Brother    Alzheimer's disease Sister    Colon cancer Neg Hx  Social History   Socioeconomic History   Marital status: Married    Spouse name: Eliana Friedel   Number of children: 2   Years of education: 12   Highest education level: 12th grade  Occupational History   Occupation: retired, Personnel officer    Comment: Architect: RETIRED  Tobacco Use   Smoking status: Every Day    Current packs/day: 1.00    Average packs/day: 1 pack/day for 65.8 years (65.8 ttl pk-yrs)    Types: Cigarettes    Start date: 08/20/1957    Passive exposure: Current   Smokeless tobacco: Never  Vaping Use   Vaping status: Former   Start date: 09/10/2016   Quit date: 10/11/2016  Substance and Sexual Activity   Alcohol use: No    Alcohol/week: 0.0 standard drinks of alcohol    Comment: quit in July 2015   Drug use: No   Sexual activity: Not Currently  Other Topics Concern   Not on file  Social History Narrative   Accompanied by daughter Milagros Reap   Lives w/ wife, daughter   Social Determinants of Health   Financial Resource Strain: Low Risk  (06/21/2022)   Overall Financial Resource Strain (CARDIA)    Difficulty of Paying Living Expenses: Not hard at all  Food Insecurity: No Food Insecurity (06/21/2022)   Hunger Vital Sign    Worried About Running Out of Food in the Last Year: Never true    Ran Out of Food in the Last Year: Never true  Transportation Needs: No Transportation Needs (06/21/2022)   PRAPARE - Administrator, Civil Service (Medical): No    Lack of Transportation (Non-Medical): No  Physical Activity: Inactive (06/21/2022)   Exercise Vital Sign    Days of Exercise per Week: 0 days    Minutes of Exercise per Session: 0 min  Stress: No Stress Concern Present (06/21/2022)   [employer] of Occupational Health - Occupational Stress Questionnaire    Feeling of Stress : Not at all  Social Connections: Moderately Integrated (06/21/2022)   Social  Connection and Isolation Panel [NHANES]    Frequency of Communication with Friends and Family: More than three times a week    Frequency of Social Gatherings with Friends and Family: More than three times a week    Attends Religious Services: More than 4 times per year    Active Member of [employer] or Organizations: No    Attends Banker Meetings: Never    Marital Status: Married  Catering manager Violence: Not At Risk (06/21/2022)   Humiliation, Afraid, Rape, and Kick questionnaire    Fear of Current or Ex-Partner: No    Emotionally Abused: No    Physically Abused: No    Sexually Abused: No     BP 128/72 (BP Location: Left Arm, Patient Position: Sitting, Cuff Size: Normal)   Pulse 60   Ht 5\' 3"  (1.6 m)   Wt 142 lb 3.2 oz (64.5 kg)   SpO2 99%   BMI 25.19 kg/m   Physical Exam:  Well appearing NAD HEENT: Unremarkable Neck:  No JVD, no thyromegally Lymphatics:  No adenopathy Back:  No CVA tenderness Lungs:  Clear HEART:  Regular rate rhythm, no murmurs, no rubs, no clicks Abd:  soft, positive bowel sounds, no organomegally, no rebound, no guarding Ext:  2 plus pulses, no edema, no cyanosis, no clubbing Skin:  No rashes no nodules Neuro:  CN II through XII intact, motor grossly intact  DEVICE  Normal device function.  See PaceArt for details.   Assess/Plan:  1. ICD lead noise - he has been stable over the past 20 months. He is approaching ERI. We discussed treatment options. For now we will plan watchful waiting. He has a magnet that he is instructed to use if he were to receive a shock due to lead noise. 2. Chronic systolic heart failure - his symptoms are class 2. No change. 3. CAD - he has not had any anginal symptoms. 4. Tobacco abuse - He is encouraged to stop smoking. He is still smoking a ppd.    Sharlot Gowda Lamon Rotundo,MD

## 2023-07-30 ENCOUNTER — Ambulatory Visit: Payer: Medicare Other

## 2023-08-12 DIAGNOSIS — Z23 Encounter for immunization: Secondary | ICD-10-CM | POA: Diagnosis not present

## 2023-08-26 LAB — CUP PACEART REMOTE DEVICE CHECK
Battery Remaining Longevity: 4 mo
Battery Remaining Percentage: 4 %
Battery Voltage: 2.62 V
Brady Statistic AP VP Percent: 61 %
Brady Statistic AP VS Percent: 1 %
Brady Statistic AS VP Percent: 37 %
Brady Statistic AS VS Percent: 1 %
Brady Statistic RA Percent Paced: 60 %
Date Time Interrogation Session: 20240904122420
HighPow Impedance: 69 Ohm
HighPow Impedance: 69 Ohm
Implantable Lead Connection Status: 753985
Implantable Lead Connection Status: 753985
Implantable Lead Connection Status: 753985
Implantable Lead Implant Date: 20160923
Implantable Lead Implant Date: 20160923
Implantable Lead Implant Date: 20160923
Implantable Lead Location: 753858
Implantable Lead Location: 753859
Implantable Lead Location: 753860
Implantable Lead Model: 7122
Implantable Pulse Generator Implant Date: 20160923
Lead Channel Impedance Value: 400 Ohm
Lead Channel Impedance Value: 480 Ohm
Lead Channel Impedance Value: 760 Ohm
Lead Channel Pacing Threshold Amplitude: 0.75 V
Lead Channel Pacing Threshold Amplitude: 0.75 V
Lead Channel Pacing Threshold Amplitude: 1.25 V
Lead Channel Pacing Threshold Pulse Width: 0.5 ms
Lead Channel Pacing Threshold Pulse Width: 0.5 ms
Lead Channel Pacing Threshold Pulse Width: 0.8 ms
Lead Channel Sensing Intrinsic Amplitude: 12 mV
Lead Channel Sensing Intrinsic Amplitude: 4.1 mV
Lead Channel Setting Pacing Amplitude: 2 V
Lead Channel Setting Pacing Amplitude: 2 V
Lead Channel Setting Pacing Amplitude: 2.5 V
Lead Channel Setting Pacing Pulse Width: 0.5 ms
Lead Channel Setting Pacing Pulse Width: 0.8 ms
Lead Channel Setting Sensing Sensitivity: 0.5 mV
Pulse Gen Serial Number: 7294918
Zone Setting Status: 755011

## 2023-08-30 ENCOUNTER — Ambulatory Visit: Payer: Medicare Other

## 2023-08-30 DIAGNOSIS — I5022 Chronic systolic (congestive) heart failure: Secondary | ICD-10-CM | POA: Diagnosis not present

## 2023-08-30 LAB — CUP PACEART REMOTE DEVICE CHECK
Battery Remaining Longevity: 3 mo
Battery Remaining Percentage: 3 %
Battery Voltage: 2.62 V
Brady Statistic AP VP Percent: 56 %
Brady Statistic AP VS Percent: 1 %
Brady Statistic AS VP Percent: 42 %
Brady Statistic AS VS Percent: 1 %
Brady Statistic RA Percent Paced: 55 %
Date Time Interrogation Session: 20241004020019
HighPow Impedance: 72 Ohm
HighPow Impedance: 72 Ohm
Implantable Lead Connection Status: 753985
Implantable Lead Connection Status: 753985
Implantable Lead Connection Status: 753985
Implantable Lead Implant Date: 20160923
Implantable Lead Implant Date: 20160923
Implantable Lead Implant Date: 20160923
Implantable Lead Location: 753858
Implantable Lead Location: 753859
Implantable Lead Location: 753860
Implantable Lead Model: 7122
Implantable Pulse Generator Implant Date: 20160923
Lead Channel Impedance Value: 410 Ohm
Lead Channel Impedance Value: 460 Ohm
Lead Channel Impedance Value: 750 Ohm
Lead Channel Pacing Threshold Amplitude: 0.75 V
Lead Channel Pacing Threshold Amplitude: 0.75 V
Lead Channel Pacing Threshold Amplitude: 1.25 V
Lead Channel Pacing Threshold Pulse Width: 0.5 ms
Lead Channel Pacing Threshold Pulse Width: 0.5 ms
Lead Channel Pacing Threshold Pulse Width: 0.8 ms
Lead Channel Sensing Intrinsic Amplitude: 12 mV
Lead Channel Sensing Intrinsic Amplitude: 3.7 mV
Lead Channel Setting Pacing Amplitude: 2 V
Lead Channel Setting Pacing Amplitude: 2 V
Lead Channel Setting Pacing Amplitude: 2.5 V
Lead Channel Setting Pacing Pulse Width: 0.5 ms
Lead Channel Setting Pacing Pulse Width: 0.8 ms
Lead Channel Setting Sensing Sensitivity: 0.5 mV
Pulse Gen Serial Number: 7294918
Zone Setting Status: 755011

## 2023-09-06 NOTE — Progress Notes (Signed)
Remote ICD transmission.   

## 2023-09-18 ENCOUNTER — Other Ambulatory Visit: Payer: Self-pay | Admitting: Nurse Practitioner

## 2023-09-30 ENCOUNTER — Ambulatory Visit (INDEPENDENT_AMBULATORY_CARE_PROVIDER_SITE_OTHER): Payer: Medicare Other

## 2023-09-30 DIAGNOSIS — I5022 Chronic systolic (congestive) heart failure: Secondary | ICD-10-CM

## 2023-09-30 DIAGNOSIS — I255 Ischemic cardiomyopathy: Secondary | ICD-10-CM

## 2023-09-30 LAB — CUP PACEART REMOTE DEVICE CHECK
Battery Remaining Longevity: 1 mo
Battery Remaining Percentage: 3 %
Battery Voltage: 2.6 V
Brady Statistic AP VP Percent: 56 %
Brady Statistic AP VS Percent: 1 %
Brady Statistic AS VP Percent: 41 %
Brady Statistic AS VS Percent: 1 %
Brady Statistic RA Percent Paced: 56 %
Date Time Interrogation Session: 20241103015023
HighPow Impedance: 70 Ohm
HighPow Impedance: 70 Ohm
Implantable Lead Connection Status: 753985
Implantable Lead Connection Status: 753985
Implantable Lead Connection Status: 753985
Implantable Lead Implant Date: 20160923
Implantable Lead Implant Date: 20160923
Implantable Lead Implant Date: 20160923
Implantable Lead Location: 753858
Implantable Lead Location: 753859
Implantable Lead Location: 753860
Implantable Lead Model: 7122
Implantable Pulse Generator Implant Date: 20160923
Lead Channel Impedance Value: 400 Ohm
Lead Channel Impedance Value: 480 Ohm
Lead Channel Impedance Value: 750 Ohm
Lead Channel Pacing Threshold Amplitude: 0.75 V
Lead Channel Pacing Threshold Amplitude: 0.75 V
Lead Channel Pacing Threshold Amplitude: 1.25 V
Lead Channel Pacing Threshold Pulse Width: 0.5 ms
Lead Channel Pacing Threshold Pulse Width: 0.5 ms
Lead Channel Pacing Threshold Pulse Width: 0.8 ms
Lead Channel Sensing Intrinsic Amplitude: 12 mV
Lead Channel Sensing Intrinsic Amplitude: 3.6 mV
Lead Channel Setting Pacing Amplitude: 2 V
Lead Channel Setting Pacing Amplitude: 2 V
Lead Channel Setting Pacing Amplitude: 2.5 V
Lead Channel Setting Pacing Pulse Width: 0.5 ms
Lead Channel Setting Pacing Pulse Width: 0.8 ms
Lead Channel Setting Sensing Sensitivity: 0.5 mV
Pulse Gen Serial Number: 7294918
Zone Setting Status: 755011

## 2023-10-14 ENCOUNTER — Telehealth: Payer: Self-pay

## 2023-10-14 NOTE — Telephone Encounter (Signed)
Device alert for ERI reached 11/17 - route to triage. Events for non-sustained RV oversensing, known ventricular lead noise.  Patient was seen in office by Dr. Ladona Ridgel 06/2023. Discussed options about RV lead noise and decided watchful waiting.   Dr. Ladona Ridgel, does patient need a new apt to discuss gen change? Please advise.

## 2023-10-15 NOTE — Progress Notes (Signed)
Remote ICD transmission.   

## 2023-10-18 NOTE — Telephone Encounter (Signed)
Ok to schedule gen change. GT

## 2023-10-20 NOTE — Telephone Encounter (Signed)
Per Dr. Ladona Ridgel, ok to schedule gen change.

## 2023-10-22 ENCOUNTER — Telehealth: Payer: Self-pay

## 2023-10-22 DIAGNOSIS — I5022 Chronic systolic (congestive) heart failure: Secondary | ICD-10-CM

## 2023-10-22 NOTE — Telephone Encounter (Signed)
Called pt to schedule his BiV-ICD Gen Change - He was napping and ask me to call back later.

## 2023-10-31 ENCOUNTER — Ambulatory Visit (INDEPENDENT_AMBULATORY_CARE_PROVIDER_SITE_OTHER): Payer: Medicare Other

## 2023-10-31 DIAGNOSIS — I255 Ischemic cardiomyopathy: Secondary | ICD-10-CM | POA: Diagnosis not present

## 2023-11-01 ENCOUNTER — Telehealth: Payer: Self-pay | Admitting: Internal Medicine

## 2023-11-01 NOTE — Addendum Note (Signed)
Addended by: Cleda Mccreedy on: 11/01/2023 12:38 PM   Modules accepted: Orders

## 2023-11-01 NOTE — Telephone Encounter (Signed)
Pt is scheduled for BiV-ICD Gen Change with Dr. Ladona Ridgel on 12/16 at 11:30 AM.  He will go to [facility] in [place] to have his labs done.  I will mail his Instruction letter but he may not get it in time so I went over detailed instructions with him over the phone.

## 2023-11-01 NOTE — Telephone Encounter (Signed)
Pt is requesting a callback from April regarding when she called pt to schedule his BiV-ICD Gen Change - but he was napping. He stated he never received another callback. Please advise

## 2023-11-04 DIAGNOSIS — I5022 Chronic systolic (congestive) heart failure: Secondary | ICD-10-CM | POA: Diagnosis not present

## 2023-11-04 NOTE — Telephone Encounter (Signed)
Pt is scheduled on 12/16 for BiV ICD Gen Change.

## 2023-11-05 ENCOUNTER — Telehealth: Payer: Self-pay

## 2023-11-05 DIAGNOSIS — I5022 Chronic systolic (congestive) heart failure: Secondary | ICD-10-CM | POA: Diagnosis not present

## 2023-11-05 LAB — CBC
Hematocrit: 40.5 % (ref 37.5–51.0)
Hemoglobin: 13.6 g/dL (ref 13.0–17.7)
MCH: 31.4 pg (ref 26.6–33.0)
MCHC: 33.6 g/dL (ref 31.5–35.7)
MCV: 94 fL (ref 79–97)
Platelets: 260 10*3/uL (ref 150–450)
RBC: 4.33 x10E6/uL (ref 4.14–5.80)
RDW: 12.3 % (ref 11.6–15.4)
WBC: 7.6 10*3/uL (ref 3.4–10.8)

## 2023-11-05 NOTE — Telephone Encounter (Signed)
Called pt to let him know I have mailed his Instruction letter to him. I went over details with him again just incase he doesn't receive his letter before his procedure.

## 2023-11-06 LAB — BASIC METABOLIC PANEL
BUN/Creatinine Ratio: 21 (ref 10–24)
BUN: 14 mg/dL (ref 8–27)
CO2: 23 mmol/L (ref 20–29)
Calcium: 9.3 mg/dL (ref 8.6–10.2)
Chloride: 98 mmol/L (ref 96–106)
Creatinine, Ser: 0.68 mg/dL — ABNORMAL LOW (ref 0.76–1.27)
Glucose: 90 mg/dL (ref 70–99)
Potassium: 4.9 mmol/L (ref 3.5–5.2)
Sodium: 136 mmol/L (ref 134–144)
eGFR: 96 mL/min/{1.73_m2} (ref >59)

## 2023-11-07 LAB — CUP PACEART REMOTE DEVICE CHECK
Battery Remaining Longevity: 0 mo
Battery Voltage: 2.59 V
Brady Statistic AP VP Percent: 56 %
Brady Statistic AP VS Percent: 1 %
Brady Statistic AS VP Percent: 41 %
Brady Statistic AS VS Percent: 1.1 %
Brady Statistic RA Percent Paced: 55 %
Date Time Interrogation Session: 20241204215416
HighPow Impedance: 65 Ohm
HighPow Impedance: 65 Ohm
Implantable Lead Connection Status: 753985
Implantable Lead Connection Status: 753985
Implantable Lead Connection Status: 753985
Implantable Lead Implant Date: 20160923
Implantable Lead Implant Date: 20160923
Implantable Lead Implant Date: 20160923
Implantable Lead Location: 753858
Implantable Lead Location: 753859
Implantable Lead Location: 753860
Implantable Lead Model: 7122
Implantable Pulse Generator Implant Date: 20160923
Lead Channel Impedance Value: 400 Ohm
Lead Channel Impedance Value: 460 Ohm
Lead Channel Impedance Value: 710 Ohm
Lead Channel Pacing Threshold Amplitude: 0.75 V
Lead Channel Pacing Threshold Amplitude: 0.75 V
Lead Channel Pacing Threshold Amplitude: 1.25 V
Lead Channel Pacing Threshold Pulse Width: 0.5 ms
Lead Channel Pacing Threshold Pulse Width: 0.5 ms
Lead Channel Pacing Threshold Pulse Width: 0.8 ms
Lead Channel Sensing Intrinsic Amplitude: 12 mV
Lead Channel Sensing Intrinsic Amplitude: 3.4 mV
Lead Channel Setting Pacing Amplitude: 2 V
Lead Channel Setting Pacing Amplitude: 2 V
Lead Channel Setting Pacing Amplitude: 2.5 V
Lead Channel Setting Pacing Pulse Width: 0.5 ms
Lead Channel Setting Pacing Pulse Width: 0.8 ms
Lead Channel Setting Sensing Sensitivity: 0.5 mV
Pulse Gen Serial Number: 7294918
Zone Setting Status: 755011

## 2023-11-11 ENCOUNTER — Ambulatory Visit (HOSPITAL_COMMUNITY)
Admission: RE | Disposition: A | Discharge status: Home / Self Care | Payer: Self-pay | Source: Home / Self Care | Attending: Internal Medicine

## 2023-11-11 ENCOUNTER — Other Ambulatory Visit: Payer: Self-pay

## 2023-11-11 ENCOUNTER — Ambulatory Visit (HOSPITAL_COMMUNITY)
Admission: RE | Admit: 2023-11-11 | Discharge: 2023-11-11 | Disposition: A | Discharge status: Home / Self Care | Payer: Medicare Other | Attending: Internal Medicine | Admitting: Internal Medicine

## 2023-11-11 ENCOUNTER — Ambulatory Visit (HOSPITAL_COMMUNITY): Payer: Medicare Other

## 2023-11-11 DIAGNOSIS — Z4502 Encounter for adjustment and management of automatic implantable cardiac defibrillator: Secondary | ICD-10-CM | POA: Diagnosis not present

## 2023-11-11 DIAGNOSIS — F17210 Nicotine dependence, cigarettes, uncomplicated: Secondary | ICD-10-CM | POA: Diagnosis not present

## 2023-11-11 DIAGNOSIS — I110 Hypertensive heart disease with heart failure: Secondary | ICD-10-CM | POA: Diagnosis not present

## 2023-11-11 DIAGNOSIS — Z950 Presence of cardiac pacemaker: Secondary | ICD-10-CM | POA: Diagnosis not present

## 2023-11-11 DIAGNOSIS — I5022 Chronic systolic (congestive) heart failure: Secondary | ICD-10-CM | POA: Insufficient documentation

## 2023-11-11 DIAGNOSIS — I82290 Acute embolism and thrombosis of other thoracic veins: Secondary | ICD-10-CM | POA: Diagnosis not present

## 2023-11-11 DIAGNOSIS — Z8249 Family history of ischemic heart disease and other diseases of the circulatory system: Secondary | ICD-10-CM | POA: Diagnosis not present

## 2023-11-11 DIAGNOSIS — I2510 Atherosclerotic heart disease of native coronary artery without angina pectoris: Secondary | ICD-10-CM | POA: Insufficient documentation

## 2023-11-11 HISTORY — PX: BIV ICD GENERATOR CHANGEOUT: EP1194

## 2023-11-11 SURGERY — BIV ICD GENERATOR CHANGEOUT

## 2023-11-11 MED ORDER — MIDAZOLAM HCL 2 MG/2ML IJ SOLN
INTRAMUSCULAR | Status: AC
  Filled 2023-11-11: qty 2

## 2023-11-11 MED ORDER — CEFAZOLIN SODIUM-DEXTROSE 2-4 GM/100ML-% IV SOLN: 2.0000 g | INTRAVENOUS | Status: AC

## 2023-11-11 MED ORDER — FENTANYL CITRATE (PF) 100 MCG/2ML IJ SOLN
INTRAMUSCULAR | Status: AC
  Filled 2023-11-11: qty 2

## 2023-11-11 MED ORDER — CEFAZOLIN SODIUM-DEXTROSE 2-4 GM/100ML-% IV SOLN
INTRAVENOUS | Status: AC
  Administered 2023-11-11: 2 g via INTRAVENOUS
  Filled 2023-11-11: qty 100

## 2023-11-11 MED ORDER — CHLORHEXIDINE GLUCONATE 4 % EX SOLN: 4.0000 | Freq: Once | CUTANEOUS | Status: DC

## 2023-11-11 MED ORDER — IOHEXOL 350 MG/ML SOLN
INTRAVENOUS | Status: DC | PRN
  Administered 2023-11-11: 10 mL

## 2023-11-11 MED ORDER — SODIUM CHLORIDE 0.9 % IV SOLN: 80.0000 mg | INTRAVENOUS | Status: AC

## 2023-11-11 MED ORDER — HEPARIN (PORCINE) IN NACL 1000-0.9 UT/500ML-% IV SOLN
INTRAVENOUS | Status: DC | PRN
  Administered 2023-11-11: 500 mL

## 2023-11-11 MED ORDER — LIDOCAINE HCL (PF) 1 % IJ SOLN
INTRAMUSCULAR | Status: AC
  Filled 2023-11-11: qty 60

## 2023-11-11 MED ORDER — ONDANSETRON HCL 4 MG/2ML IJ SOLN: 4.0000 mg | Freq: Four times a day (QID) | INTRAMUSCULAR | Status: DC | PRN

## 2023-11-11 MED ORDER — POVIDONE-IODINE 10 % EX SWAB: 2.0000 | Freq: Once | CUTANEOUS | Status: DC

## 2023-11-11 MED ORDER — LIDOCAINE HCL (PF) 1 % IJ SOLN
INTRAMUSCULAR | Status: DC | PRN
  Administered 2023-11-11: 50 mL

## 2023-11-11 MED ORDER — SODIUM CHLORIDE 0.9 % IV SOLN
INTRAVENOUS | Status: AC
  Administered 2023-11-11: 80 mg
  Filled 2023-11-11: qty 2

## 2023-11-11 MED ORDER — SODIUM CHLORIDE 0.9 % IV SOLN: INTRAVENOUS | Status: DC

## 2023-11-11 MED ORDER — ACETAMINOPHEN 325 MG PO TABS: 325.0000 mg | ORAL_TABLET | ORAL | Status: DC | PRN

## 2023-11-11 MED ORDER — MIDAZOLAM HCL 5 MG/5ML IJ SOLN
INTRAMUSCULAR | Status: DC | PRN
  Administered 2023-11-11 (×2): 1 mg via INTRAVENOUS

## 2023-11-11 MED ORDER — FENTANYL CITRATE (PF) 100 MCG/2ML IJ SOLN
INTRAMUSCULAR | Status: DC | PRN
  Administered 2023-11-11 (×2): 12.5 ug via INTRAVENOUS

## 2023-11-11 SURGICAL SUPPLY — 10 items
CABLE SURGICAL S-101-97-12 (CABLE) ×2 IMPLANT
CRT GALLANT HF DF1 CDHFA500T (ICD Generator) ×1 IMPLANT
DEVICE CRT GALLANT HF DF1 (ICD Generator) IMPLANT
ELECT DEFIB PAD ADLT CADENCE (PAD) IMPLANT
GUIDEWIRE ANGLED .035X150CM (WIRE) IMPLANT
POUCH AIGIS-R ANTIBACT ICD (Mesh General) ×1 IMPLANT
POUCH AIGIS-R ANTIBACT ICD LRG (Mesh General) IMPLANT
SHEATH 7FR PRELUDE SNAP 13 (SHEATH) IMPLANT
SHEATH 7FR PRELUDE SNAP 25 (SHEATH) IMPLANT
TRAY PACEMAKER INSERTION (PACKS) ×2 IMPLANT

## 2023-11-11 NOTE — Progress Notes (Signed)
Pt ambulated without difficulty or bleeding.  Discharge instructions given to pt and daughter who verbalize understanding and deny further questions.  Discharged home with  daughter who will drive and stay with pt x 24 hrs.

## 2023-11-11 NOTE — Discharge Instructions (Signed)

## 2023-11-11 NOTE — H&P (Signed)
HPI Roy [name] returns today for followup. He is a pleasant 77 yo man with a h/o chronic systolic heart failure, s/p biv ICD insertion. He has had some noise on his ICD lead and we have reprogrammed his device. He has not had syncope. When we saw him last 13 months ago, we gave him a magnet to keep with him. He has not had chest pain or sob.     Allergies  No Known Allergies             Current Outpatient Medications  Medication Sig Dispense Refill   acetaminophen (TYLENOL) 500 MG tablet Take 1,000 mg by mouth every 8 (eight) hours as needed for moderate pain or headache.       aspirin EC 81 MG tablet Take 81 mg by mouth every evening.        atorvastatin (LIPITOR) 20 MG tablet Take 20 mg by mouth every evening.       BRILINTA 90 MG TABS tablet Take 90 mg by mouth 2 (two) times daily.       carvedilol (COREG) 6.25 MG tablet TAKE 1 TABLET(6.25 MG) BY MOUTH TWICE DAILY WITH A MEAL 180 tablet 3   cetirizine (ZYRTEC) 10 MG tablet Take 10 mg by mouth daily.       dextromethorphan-guaiFENesin (MUCINEX DM) 30-600 MG 12hr tablet Take 1 tablet by mouth 2 (two) times daily.       gabapentin (NEURONTIN) 300 MG capsule Take 300 mg by mouth 2 (two) times daily.        HYDROcodone-acetaminophen (NORCO/VICODIN) 5-325 MG tablet Take 2 tablets by mouth every 6 (six) hours as needed for severe pain.       magnesium oxide (MAG-OX) 400 MG tablet Take 400 mg by mouth every evening.       Multiple Vitamins-Minerals (CENTRUM SILVER PO) Take 1 tablet by mouth daily. 50+       naphazoline-pheniramine (VISINE) 0.025-0.3 % ophthalmic solution Place 1 drop into both eyes daily as needed for eye irritation (itching). Allergy eye       Omega-3 Fatty Acids (FISH OIL) 1000 MG CAPS Take 1,000 mg by mouth 2 (two) times daily.       sacubitril-valsartan (ENTRESTO) 97-103 MG Take 1 tablet by mouth 2 (two) times daily. 180 tablet 3   Salicylic Acid (GOLD BOND PSORIASIS RELIEF) 3 % CREA Apply 1 application topically  daily as needed (psoriasis).       silodosin (RAPAFLO) 8 MG CAPS capsule Take 8 mg by mouth daily with breakfast.       SUPER B COMPLEX/C PO Take 1 tablet by mouth every evening.       tamsulosin (FLOMAX) 0.4 MG CAPS capsule Take 0.4 mg by mouth daily after supper.       traMADol (ULTRAM) 50 MG tablet Take 50 mg by mouth daily as needed for moderate pain.           No current facility-administered medications for this visit.              Past Medical History:  Diagnosis Date   AICD (automatic cardioverter/defibrillator) present 08/19/2015    [employer] BiV ICD for primary prevention by Dr. Ladona Ridgel   Alcohol abuse      6 beers per day; hospital admission in 2009 for withdrawal symptoms   Alcoholic cirrhosis (HCC)     Anxiety and depression      denies    Cerebrovascular disease  2009    TIA; 2009- right ICA stent; re-intervention for restenosis complicated by [facility] w/o sx   CHF (congestive heart failure) (HCC) 06-02-14   Chronic obstructive pulmonary disease (HCC)     Degenerative joint disease      Total shoulder arthroplasty-right   Hyperlipidemia     Hypertension     LBBB (left bundle branch block)      Normal echo-2011; stress nuclear in 09/2010--septal hypoperfusion representing nontransmural infarction or the effect of left bundle branch block, no ischemia   Myocardial infarction [facility]) June 02, 2014    Massive Heart Attack   Peripheral vascular disease (HCC)     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   Thrombocytopenia (HCC)     Tobacco abuse      -100 pack years; 1.5 packs per day   Traumatic seroma of thigh (HCC)      left   Tubular adenoma of colon            ROS:    All systems reviewed and negative except as noted in the HPI.          Past Surgical History:  Procedure Laterality Date   ABDOMINAL AORTAGRAM [name] 05/04/2014    Procedure: ABDOMINAL AORTAGRAM;  Surgeon: Nada Libman, MD;  Location: [facility] CATH LAB;  Service: Cardiovascular;  Laterality: [name];    ABDOMINAL AORTOGRAM [name] 06/25/2017    Procedure: Abdominal Aortogram;  Surgeon: Nada Libman, MD;  [facility]: [facility] INVASIVE CV LAB;  Service: Cardiovascular;  Laterality: [name];   ABDOMINAL AORTOGRAM W/LOWER EXTREMITY [name] 04/22/2018    Procedure: ABDOMINAL AORTOGRAM W/LOWER EXTREMITY;  Surgeon: Nada Libman, MD;  [facility]: [facility]VAS[facility] CV LAB;  Service: Cardiovascular;  Laterality: [name];   ABDOMINAL AORTOGRAM W/LOWER EXTREMITY [name] 03/08/2020    Procedure: ABDOMINAL AORTOGRAM W/LOWER EXTREMITY;  Surgeon: Nada Libman, MD;  [facility];  Service: Cardiovascular;  Laterality: [name];   ABDOMINAL AORTOGRAM W/LOWER EXTREMITY Bilateral 08/23/2020    Procedure: ABDOMINAL AORTOGRAM W/LOWER EXTREMITY;  Surgeon: Nada Libman, MD;  [facility];  Service: Cardiovascular;  Laterality: Bilateral;   ABDOMINAL AORTOGRAM W/LOWER EXTREMITY [name] 10/31/2021    Procedure: ABDOMINAL AORTOGRAM W/LOWER EXTREMITY;  Surgeon: Nada Libman, MD;  [facility];  Service: Cardiovascular;  Laterality: [name];   ABDOMINAL AORTOGRAM W/LOWER EXTREMITY [name] 12/19/2021    Procedure: ABDOMINAL AORTOGRAM W/LOWER EXTREMITY;  Surgeon: Nada Libman, MD;  [facility];  Service: Cardiovascular;  Laterality: [name];   ABDOMINAL AORTOGRAM W/LOWER EXTREMITY Bilateral 02/19/2023    Procedure: ABDOMINAL AORTOGRAM W/LOWER EXTREMITY;  Surgeon: Nada Libman, MD;  [facility];  Service: Cardiovascular;  Laterality: Bilateral;   AGILE CAPSULE [name] 03/18/2014    Procedure: AGILE CAPSULE;  Surgeon: West Bali, MD;  [facility]: [facility];  Service: Endoscopy;  Laterality: [name];  7:30   APPLICATION OF WOUND VAC Left 09/03/2017    Procedure: APPLICATION OF WOUND VAC;  Surgeon: Fransisco Hertz, MD;  Location: [facility] OR;  Service: Vascular;  Laterality: Left;   APPLICATION OF WOUND VAC Right 04/07/2020    Procedure: PLACEMENT OF ANTIBIOTIC BEADS AND APPLICATION OF WOUND VAC;   Surgeon: Nada Libman, MD;  [facility];  Service: Vascular;  Laterality: Right;   BACK SURGERY       BACTERIAL OVERGROWTH TEST [name] 05/24/2015    Procedure: BACTERIAL OVERGROWTH TEST;  Surgeon: West Bali, MD;  [facility];  Service: Endoscopy;  Laterality: [name];  0700   BI-VENTRICULAR IMPLANTABLE CARDIOVERTER DEFIBRILLATOR  (CRT-D)   08/19/2015   CARPAL TUNNEL RELEASE Left 02/02/2016    Procedure: LEFT CARPAL TUNNEL RELEASE;  Surgeon: Cindee Salt, MD;  Location: [facility];  Service: Orthopedics;  Laterality: Left;  ANESTHESIA: IV REGIONAL UPPER ARM   CATARACT EXTRACTION W/PHACO Right 03/08/2015    Procedure: CATARACT EXTRACTION PHACO AND INTRAOCULAR LENS PLACEMENT (IOC);  Surgeon: Jethro Bolus, MD;  Location: [facility];  Service: Ophthalmology;  Laterality: Right;  CDE:9.46   CATARACT EXTRACTION W/PHACO Left 03/22/2015    Procedure: CATARACT EXTRACTION PHACO AND INTRAOCULAR LENS PLACEMENT (IOC);  Surgeon: Jethro Bolus, MD;  Location: [facility];  Service: Ophthalmology;  Laterality: Left;  CDE:5.80   COLONOSCOPY   08/22/09    Fields-(Tubular Adenoma)3-mm transverse polyp/4-mm polyp otherwise noraml/small internal hemorrhoids   COLONOSCOPY [name] 04/14/2014    QMV:HQION internal hemorrhids/normal mocsa in the terminal iluem/left colonis redundant   COLONOSCOPY W/ POLYPECTOMY   2011   ENDARTERECTOMY FEMORAL Left 08/16/2017    Procedure: ENDARTERECTOMY LEFT PROFUNDA FEMORAL;  Surgeon: Nada Libman, MD;  Location: [facility] OR;  Service: Vascular;  Laterality: Left;   ENDARTERECTOMY FEMORAL Right 03/31/2020    Procedure: RIGHT FEMORAL ENDARTERECTOMY;  Surgeon: Nada Libman, MD;  [facility];  Service: Vascular;  Laterality: Right;   EP IMPLANTABLE DEVICE [name] 08/19/2015    Procedure: BiV ICD Insertion CRT-D;  Surgeon: Marinus Maw, MD;  Location: [facility] INVASIVE CV LAB;  Service: Cardiovascular;  Laterality: [name];   ESOPHAGOGASTRODUODENOSCOPY [name] 03/05/2014    SLF: 1. Stricture at  the gastroesophagael junction 2. Small hiatal hernia 3. Moderate non-erosive gastritis and duodentitis. 4. No surce for Melena identified.    FEMORAL-POPLITEAL BYPASS GRAFT Left 08/16/2017    Procedure: LEFT  FEMORAL-POPLITEAL ARTERY  BYPASS GRAFT;  Surgeon: Nada Libman, MD;  [facility];  Service: Vascular;  Laterality: Left;   GIVENS CAPSULE STUDY [name] 03/30/2014    Procedure: GIVENS CAPSULE STUDY;  Surgeon: West Bali, MD;  [facility];  Service: Endoscopy;  Laterality: [name];  7:30   GROIN DEBRIDEMENT Right 04/07/2020    Procedure: INCISION AND DRAINAGE OF RIGHT GROIN;  Surgeon: Nada Libman, MD;  [facility];  Service: Vascular;  Laterality: Right;   I & D EXTREMITY Left 09/03/2017    Procedure: IRRIGATION AND DEBRIDEMENT EXTREMITY LEFT THIGH SEROMA;  Surgeon: Fransisco Hertz, MD;  [facility];  Service: Vascular;  Laterality: Left;   INSERT / REPLACE / REMOVE PACEMAKER       INSERTION OF ILIAC STENT   03/31/2020    Procedure: INSERTION OF RIGHT ILIAC ARTERY STENT AND RIGHT SUPERFICIAL FEMORAL ARTERY STENT;  Surgeon: Nada Libman, MD;  [facility];  Service: Vascular;;   IR ANGIO INTRA EXTRACRAN SEL COM CAROTID INNOMINATE BILAT MOD SED   07/16/2017   IR ANGIO INTRA EXTRACRAN SEL COM CAROTID INNOMINATE BILAT MOD SED   06/12/2019   IR ANGIO INTRA EXTRACRAN SEL COM CAROTID INNOMINATE BILAT MOD SED   01/17/2021   IR ANGIO VERTEBRAL SEL SUBCLAVIAN INNOMINATE BILAT MOD SED   07/16/2017   IR ANGIO VERTEBRAL SEL SUBCLAVIAN INNOMINATE BILAT MOD SED   06/12/2019   IR ANGIO VERTEBRAL SEL SUBCLAVIAN INNOMINATE BILAT MOD SED   01/17/2021   IR RADIOLOGIST EVAL & MGMT   04/01/2017   IR RADIOLOGIST EVAL & MGMT   08/02/2017   IR TRANSCATH EXCRAN VERT OR CAR A  STENT   07/22/2019   IR US GUIDE VASC ACCESS RIGHT   06/12/2019   IR US GUIDE VASC ACCESS RIGHT   01/17/2021   JOINT REPLACEMENT Right      Total Shoulder Replacement    LOWER EXTREMITY ANGIOGRAPHY Bilateral 06/25/2017     Procedure: Lower Extremity Angiography;  Surgeon: Nada Libman, MD;  [facility];  Service: Cardiovascular;  Laterality: Bilateral;   LUMBAR FUSION   2010   PATCH ANGIOPLASTY Right 03/31/2020    Procedure: PATCH ANGIOPLASTY USING XENOSURE BIOLOGIC PATCH 1cm x 14cm;  Surgeon: Nada Libman, MD;  Location: [facility] OR;  Service: Vascular;  Laterality: Right;   PERIPHERAL VASCULAR ATHERECTOMY Right 04/22/2018    Procedure: PERIPHERAL VASCULAR ATHERECTOMY;  Surgeon: Nada Libman, MD;  [facility];  Service: Cardiovascular;  Laterality: Right;  right superficial femoral   PERIPHERAL VASCULAR BALLOON ANGIOPLASTY Right 04/22/2018    Procedure: PERIPHERAL VASCULAR BALLOON ANGIOPLASTY;  Surgeon: Nada Libman, MD;  [facility];  Service: Cardiovascular;  Laterality: Right;  external iliac   PERIPHERAL VASCULAR BALLOON ANGIOPLASTY Left 03/08/2020    Procedure: PERIPHERAL VASCULAR BALLOON ANGIOPLASTY;  Surgeon: Nada Libman, MD;  [facility];  Service: Cardiovascular;  Laterality: Left;  Common Femoral   PERIPHERAL VASCULAR BALLOON ANGIOPLASTY Left 08/23/2020    Procedure: PERIPHERAL VASCULAR BALLOON ANGIOPLASTY;  Surgeon: Nada Libman, MD;  [facility];  Service: Cardiovascular;  Laterality: Left;  common femoral   PERIPHERAL VASCULAR BALLOON ANGIOPLASTY Left 10/31/2021    Procedure: PERIPHERAL VASCULAR BALLOON ANGIOPLASTY;  Surgeon: Nada Libman, MD;  [facility];  Service: Cardiovascular;  Laterality: Left;  external iliac   PERIPHERAL VASCULAR INTERVENTION Right 03/08/2020    Procedure: PERIPHERAL VASCULAR INTERVENTION;  Surgeon: Nada Libman, MD;  [facility];  Service: Cardiovascular;  Laterality: Right;  SFA   PERIPHERAL VASCULAR INTERVENTION Right 08/23/2020    Procedure: PERIPHERAL VASCULAR INTERVENTION;  Surgeon: Nada Libman, MD;  [facility];   Service: Cardiovascular;  Laterality: Right;  external iliac   PERIPHERAL VASCULAR INTERVENTION Right 10/31/2021    Procedure: PERIPHERAL VASCULAR INTERVENTION;  Surgeon: Nada Libman, MD;  [facility];  Service: Cardiovascular;  Laterality: Right;  superficial femoral artery   PERIPHERAL VASCULAR INTERVENTION Left 12/19/2021    Procedure: PERIPHERAL VASCULAR INTERVENTION;  Surgeon: Nada Libman, MD;  [facility];  Service: Cardiovascular;  Laterality: Left;  stent lt ext iliac / pta common femoral   PERIPHERAL VASCULAR INTERVENTION   02/19/2023    Procedure: PERIPHERAL VASCULAR INTERVENTION;  Surgeon: Nada Libman, MD;  [facility];  Service: Cardiovascular;;   RADIOLOGY WITH ANESTHESIA [name] 07/01/2019    Procedure: STENTING;  Surgeon: Julieanne Cotton, MD;  [facility];  Service: Radiology;  Laterality: [name];   RADIOLOGY WITH ANESTHESIA [name] 07/22/2019    Procedure: STENTING;  Surgeon: Julieanne Cotton, MD;  [facility];  Service: Radiology;  Laterality: [name];   TOTAL SHOULDER ARTHROPLASTY Right 2011    Dr. Trudee Kuster NERVE TRANSPOSITION Left 02/02/2016    Procedure: LEFT IN-SITU DECOMPRESSION ULNAR NERVE ;  Surgeon: Cindee Salt, MD;  Location: [facility];  Service: Orthopedics;  Laterality: Left;   ULNAR TUNNEL RELEASE Left 02/02/2016    Procedure: LEFT CUBITAL TUNNEL RELEASE;  Surgeon: Cindee Salt, MD;  Location: [facility];  Service: Orthopedics;  Laterality: Left;   VASECTOMY   1971   VEIN HARVEST Left 08/16/2017    Procedure: USING NON REVERSE LEFT GREATER SAPHENOUS VEIN HARVEST;  Surgeon: Nada Libman, MD;  [facility];  Service: Vascular;  Laterality: Left;                 Family History  Problem Relation Age of Onset   Coronary artery disease Mother     Diabetes Mother     Heart disease Mother          Before age 44 - 5 Bypasses   Hypertension Mother     Heart attack Mother           3-4 Heart attacks   Alzheimer's disease Father     Diabetes Father     Heart attack Sister     Cancer Brother          Prostate   Hyperlipidemia Son     Prostate cancer Brother     Alzheimer's disease Sister     Colon cancer Neg Hx              Social History         Socioeconomic History   Marital status: Married      Spouse name: Apolonio Whiton   Number of children: 2   Years of education: 12   Highest education level: 12th grade  Occupational History   Occupation: retired, Personnel officer      Comment: Barista: RETIRED  Tobacco Use   Smoking status: Every Day      Current packs/day: 1.00      Average packs/day: 1 pack/day for 65.8 years (65.8 ttl pk-yrs)      Types: Cigarettes      Start date: 08/20/1957      Passive exposure: Current   Smokeless tobacco: Never  Vaping Use   Vaping status: Former   Start date: 09/10/2016   Quit date: 10/11/2016  Substance and Sexual Activity   Alcohol use: No      Alcohol/week: 0.0 standard drinks of alcohol      Comment: quit in July 2015   Drug use: No   Sexual activity: Not Currently  Other Topics Concern   Not on file  Social History Narrative    Accompanied by daughter Milagros Reap    Lives w/ wife, daughter    Social Determinants of Health        Financial Resource Strain: Low Risk  (06/21/2022)    Overall Financial Resource Strain (CARDIA)     Difficulty of Paying Living Expenses: Not hard at all  Food Insecurity: No Food Insecurity (06/21/2022)    Hunger Vital Sign     Worried About Running Out of Food in the Last Year: Never true     Ran Out of Food in the Last Year: Never true  Transportation Needs: No Transportation Needs (06/21/2022)    PRAPARE - Therapist, art (Medical): No     Lack of Transportation (Non-Medical): No  Physical Activity: Inactive (06/21/2022)    Exercise Vital Sign     Days of Exercise per Week: 0 days     Minutes of Exercise per Session:  0 min  Stress: No Stress Concern Present (06/21/2022)    [employer] of Occupational Health - Occupational Stress Questionnaire     Feeling of Stress : Not at all  Social  Connections: Moderately Integrated (06/21/2022)    Social Connection and Isolation Panel [NHANES]     Frequency of Communication with Friends and Family: More than three times a week     Frequency of Social Gatherings with Friends and Family: More than three times a week     Attends Religious Services: More than 4 times per year     Active Member of [employer] or Organizations: No     Attends Banker Meetings: Never     Marital Status: Married  Catering manager Violence: Not At Risk (06/21/2022)    Humiliation, Afraid, Rape, and Kick questionnaire     Fear of Current or Ex-Partner: No     Emotionally Abused: No     Physically Abused: No     Sexually Abused: No        BP 128/72 (BP Location: Left Arm, Patient Position: Sitting, Cuff Size: Normal)   Pulse 60   Ht 5\' 3"  (1.6 m)   Wt 142 lb 3.2 oz (64.5 kg)   SpO2 99%   BMI 25.19 kg/m    Physical Exam:   Well appearing NAD HEENT: Unremarkable Neck:  No JVD, no thyromegally Lymphatics:  No adenopathy Back:  No CVA tenderness Lungs:  Clear HEART:  Regular rate rhythm, no murmurs, no rubs, no clicks Abd:  soft, positive bowel sounds, no organomegally, no rebound, no guarding Ext:  2 plus pulses, no edema, no cyanosis, no clubbing Skin:  No rashes no nodules Neuro:  CN II through XII intact, motor grossly intact   DEVICE  Normal device function.  See PaceArt for details.    Assess/Plan:   1. ICD lead noise - he has been stable over the past 20 months. He is approaching ERI. We discussed treatment options. For now we will plan watchful waiting. He has a magnet that he is instructed to use if he were to receive a shock due to lead noise.If his vein is open, we will consider placing a new lead at the time of gen change out. 2. Chronic systolic heart  failure - his symptoms are class 2. No change. 3. CAD - he has not had any anginal symptoms. 4. Tobacco abuse - He is encouraged to stop smoking. He is still smoking a ppd.    Sharlot Gowda Bryann Mcnealy,MD

## 2023-11-12 ENCOUNTER — Encounter (HOSPITAL_COMMUNITY): Payer: Self-pay | Admitting: Internal Medicine

## 2023-11-12 MED FILL — Midazolam HCl Inj 2 MG/2ML (Base Equivalent): INTRAMUSCULAR | Qty: 2 | Status: AC

## 2023-11-13 ENCOUNTER — Telehealth: Payer: Self-pay

## 2023-11-13 NOTE — Telephone Encounter (Signed)
Follow-up after same day discharge: Implant date: 11/11/2023 MD: Lewayne Bunting, MD Device: [facility] Guys ICD Location: Left Chest   Wound check visit: 11/28/2023 90 day MD follow-up: 02/19/2024  Remote Transmission received:yes  Dressing/sling removed: yes  Confirm OAC restart on: yes  Please continue to monitor your cardiac device site for redness, swelling, and drainage. Call the device clinic at [phone] if you experience these symptoms, fever/chills, or have questions about your device.   Remote monitoring is used to monitor your cardiac device from home. This monitoring is scheduled every 91 days by our office. It allows [place] to keep an eye on the functioning of your device to ensure it is working properly.

## 2023-11-28 ENCOUNTER — Ambulatory Visit: Payer: Medicare Other | Attending: Cardiovascular Disease

## 2023-11-28 DIAGNOSIS — I5022 Chronic systolic (congestive) heart failure: Secondary | ICD-10-CM | POA: Diagnosis not present

## 2023-11-28 LAB — CUP PACEART INCLINIC DEVICE CHECK
Battery Remaining Longevity: 82 mo
Brady Statistic RA Percent Paced: 60 %
Brady Statistic RV Percent Paced: 99.47 %
Date Time Interrogation Session: 20250102170506
HighPow Impedance: 60.75 Ohm
Implantable Lead Connection Status: 753985
Implantable Lead Connection Status: 753985
Implantable Lead Connection Status: 753985
Implantable Lead Implant Date: 20160923
Implantable Lead Implant Date: 20160923
Implantable Lead Implant Date: 20160923
Implantable Lead Location: 753858
Implantable Lead Location: 753859
Implantable Lead Location: 753860
Implantable Lead Model: 7122
Implantable Pulse Generator Implant Date: 20241216
Lead Channel Impedance Value: 450 Ohm
Lead Channel Impedance Value: 512.5 Ohm
Lead Channel Impedance Value: 837.5 Ohm
Lead Channel Pacing Threshold Amplitude: 0.75 V
Lead Channel Pacing Threshold Amplitude: 0.75 V
Lead Channel Pacing Threshold Amplitude: 1 V
Lead Channel Pacing Threshold Amplitude: 1 V
Lead Channel Pacing Threshold Amplitude: 1.25 V
Lead Channel Pacing Threshold Amplitude: 1.25 V
Lead Channel Pacing Threshold Pulse Width: 0.5 ms
Lead Channel Pacing Threshold Pulse Width: 0.5 ms
Lead Channel Pacing Threshold Pulse Width: 0.5 ms
Lead Channel Pacing Threshold Pulse Width: 0.5 ms
Lead Channel Pacing Threshold Pulse Width: 0.8 ms
Lead Channel Pacing Threshold Pulse Width: 0.8 ms
Lead Channel Sensing Intrinsic Amplitude: 12 mV
Lead Channel Sensing Intrinsic Amplitude: 4.3 mV
Lead Channel Setting Pacing Amplitude: 2 V
Lead Channel Setting Pacing Amplitude: 2.5 V
Lead Channel Setting Pacing Amplitude: 2.5 V
Lead Channel Setting Pacing Pulse Width: 0.5 ms
Lead Channel Setting Pacing Pulse Width: 0.8 ms
Lead Channel Setting Sensing Sensitivity: 0.6 mV
Pulse Gen Serial Number: 111071805
Zone Setting Status: 755011

## 2023-11-28 NOTE — Patient Instructions (Addendum)
   After Your ICD (Implantable Cardiac Defibrillator)    Monitor your defibrillator site for redness, swelling, and drainage. Call the device clinic at [phone] if you experience these symptoms or fever/chills.  You have a small hematoma at your device site.  It is healing.  Continue to monitor, if any signs of increased swelling or feeling hard where it is soft now, then call our office at above number immediately.  We will see you back in 1 week (12/04/23) at 2:00pm in [place] with the nurse and Dr. Waddell to re-evaluate and ensure healing.   Your incision was closed with Steri-strips or staples:  You may shower 7 days after your procedure and wash your incision with soap and water . Avoid lotions, ointments, or perfumes over your incision until it is well-healed.  You may use a hot tub or a pool after your wound check appointment if the incision is completely closed.  There are no other restrictions in arm movement after your wound check appointment.  Your ICD is designed to protect you from life threatening heart rhythms. Because of this, you may receive a shock.   1 shock with no symptoms:  Call the office during business hours. 1 shock with symptoms (chest pain, chest pressure, dizziness, lightheadedness, shortness of breath, overall feeling unwell):  Call 911. If you experience 2 or more shocks in 24 hours:  Call 911. If you receive a shock, you should not drive.  [place] DMV - no driving for 6 months if you receive appropriate therapy from your ICD.   ICD Alerts:  Some alerts are vibratory and others beep. These are NOT emergencies. Please call our office to let us  know. If this occurs at night or on weekends, it can wait until the next business day. Send a remote transmission.  If your device is capable of reading fluid status (for heart failure), you will be offered monthly monitoring to review this with you.   Remote monitoring is used to monitor your ICD from home. This  monitoring is scheduled every 91 days by our office. It allows us  to keep an eye on the functioning of your device to ensure it is working properly. You will routinely see your Electrophysiologist annually (more often if necessary).

## 2023-11-28 NOTE — Progress Notes (Signed)
 Wound check appointment for generator change out. Steri-strips removed. Edema noted around patient's incision site that was soft and squishy to touch. Provider to bedside to assess site and requested that patient return in 1 week to check on site at the [place] office. Incision edges approximated. Normal device function. Thresholds, sensing, and impedances consistent with implant measurements. Histogram distribution appropriate for patient and level of activity. No mode switches or ventricular arrhythmias noted. Patient educated about wound care, arm mobility, and shock plan.  Small amount of noise noted on patient's RV lead. Provider aware of this issue.  Patient continues to carry magnet in his pocket at Dr. Adrian request

## 2023-12-02 ENCOUNTER — Ambulatory Visit: Payer: Medicare Other

## 2023-12-02 ENCOUNTER — Ambulatory Visit (HOSPITAL_COMMUNITY): Payer: Medicare Other

## 2023-12-04 ENCOUNTER — Encounter: Payer: Self-pay | Admitting: Internal Medicine

## 2023-12-04 ENCOUNTER — Ambulatory Visit: Payer: Medicare Other | Attending: Internal Medicine

## 2023-12-04 VITALS — HR 60 | Ht 63.0 in | Wt 141.0 lb

## 2023-12-04 DIAGNOSIS — I447 Left bundle-branch block, unspecified: Secondary | ICD-10-CM

## 2023-12-04 NOTE — Patient Instructions (Addendum)
 After Your ICD (Implantable Cardiac Defibrillator)    Monitor your defibrillator site for redness, swelling, and drainage. Call the device clinic at [phone] if you experience these symptoms or fever/chills.  Your ICD is designed to protect you from life threatening heart rhythms. Because of this, you may receive a shock.   1 shock with no symptoms:  Call the office during business hours. 1 shock with symptoms (chest pain, chest pressure, dizziness, lightheadedness, shortness of breath, overall feeling unwell):  Call 911. If you experience 2 or more shocks in 24 hours:  Call 911. If you receive a shock, you should not drive.  [place] DMV - no driving for 6 months if you receive appropriate therapy from your ICD.

## 2023-12-05 NOTE — Progress Notes (Signed)
 Patient seen in clinic by myself and Dr. Ladona Ridgel. No hematoma present. Pt advised per Dr. Ladona Ridgel to keep magnet on him d/t risk of inappropriate shock.

## 2023-12-06 DIAGNOSIS — R7303 Prediabetes: Secondary | ICD-10-CM | POA: Diagnosis not present

## 2023-12-06 DIAGNOSIS — E782 Mixed hyperlipidemia: Secondary | ICD-10-CM | POA: Diagnosis not present

## 2023-12-12 DIAGNOSIS — K7460 Unspecified cirrhosis of liver: Secondary | ICD-10-CM | POA: Diagnosis not present

## 2023-12-12 DIAGNOSIS — E875 Hyperkalemia: Secondary | ICD-10-CM | POA: Diagnosis not present

## 2023-12-12 DIAGNOSIS — I671 Cerebral aneurysm, nonruptured: Secondary | ICD-10-CM | POA: Diagnosis not present

## 2023-12-12 DIAGNOSIS — E782 Mixed hyperlipidemia: Secondary | ICD-10-CM | POA: Diagnosis not present

## 2023-12-12 DIAGNOSIS — R7303 Prediabetes: Secondary | ICD-10-CM | POA: Diagnosis not present

## 2023-12-12 DIAGNOSIS — D509 Iron deficiency anemia, unspecified: Secondary | ICD-10-CM | POA: Diagnosis not present

## 2023-12-12 DIAGNOSIS — I10 Essential (primary) hypertension: Secondary | ICD-10-CM | POA: Diagnosis not present

## 2023-12-12 DIAGNOSIS — M545 Low back pain, unspecified: Secondary | ICD-10-CM | POA: Diagnosis not present

## 2023-12-12 DIAGNOSIS — I5022 Chronic systolic (congestive) heart failure: Secondary | ICD-10-CM | POA: Diagnosis not present

## 2023-12-12 DIAGNOSIS — I739 Peripheral vascular disease, unspecified: Secondary | ICD-10-CM | POA: Diagnosis not present

## 2023-12-12 DIAGNOSIS — I110 Hypertensive heart disease with heart failure: Secondary | ICD-10-CM | POA: Diagnosis not present

## 2023-12-12 DIAGNOSIS — F17200 Nicotine dependence, unspecified, uncomplicated: Secondary | ICD-10-CM | POA: Diagnosis not present

## 2023-12-27 ENCOUNTER — Encounter: Payer: Self-pay | Admitting: Nurse Practitioner

## 2023-12-27 ENCOUNTER — Ambulatory Visit: Payer: Medicare Other | Attending: Nurse Practitioner | Admitting: Nurse Practitioner

## 2023-12-27 VITALS — BP 118/58 | HR 76 | Ht 63.0 in | Wt 141.6 lb

## 2023-12-27 DIAGNOSIS — E785 Hyperlipidemia, unspecified: Secondary | ICD-10-CM | POA: Diagnosis not present

## 2023-12-27 DIAGNOSIS — I77819 Aortic ectasia, unspecified site: Secondary | ICD-10-CM | POA: Diagnosis not present

## 2023-12-27 DIAGNOSIS — Z95810 Presence of automatic (implantable) cardiac defibrillator: Secondary | ICD-10-CM | POA: Insufficient documentation

## 2023-12-27 DIAGNOSIS — I10 Essential (primary) hypertension: Secondary | ICD-10-CM | POA: Diagnosis not present

## 2023-12-27 DIAGNOSIS — I739 Peripheral vascular disease, unspecified: Secondary | ICD-10-CM | POA: Insufficient documentation

## 2023-12-27 DIAGNOSIS — Z720 Tobacco use: Secondary | ICD-10-CM | POA: Insufficient documentation

## 2023-12-27 DIAGNOSIS — I2510 Atherosclerotic heart disease of native coronary artery without angina pectoris: Secondary | ICD-10-CM | POA: Insufficient documentation

## 2023-12-27 DIAGNOSIS — R0989 Other specified symptoms and signs involving the circulatory and respiratory systems: Secondary | ICD-10-CM | POA: Diagnosis not present

## 2023-12-27 DIAGNOSIS — I5032 Chronic diastolic (congestive) heart failure: Secondary | ICD-10-CM | POA: Insufficient documentation

## 2023-12-27 NOTE — Patient Instructions (Addendum)
 Medication Instructions:  Continue all current medications.   Labwork: none  Testing/Procedures: none  Follow-Up: 6 months   Any Other Special Instructions Will Be Listed Below (If Applicable).   If you need a refill on your cardiac medications before your next appointment, please call your pharmacy.

## 2023-12-27 NOTE — Progress Notes (Unsigned)
Cardiology Office Note:    Date: 06/18/2023 ID:  SHANDON BURLINGAME, DOB January 04, 1946, MRN [id number] PCP:  Benita Stabile, MD [facility] Providers Cardiologist:  Dina Rich, MD Electrophysiologist:  Lewayne Bunting, MD  Referring MD: Benita Stabile, MD   CC: Here for follow-up  History of Present Illness:    Roy [name] is a very pleasant 78 y.o. male with a PMH of CAD, s/p NSTEMI, HFimpEF, s/p BiV ICD, HLD, HTN, cerebrovascular disease/TIA, s/p hx of ICA stent, PAD, s/p recent SFA stenting on 02/19/2023, tobacco abuse (has been smoking since 78 years old), former alcoholic, aortic dilatation, and COPD, who presents today for follow-up. Followed by [facility] for hx of PAD.   History of NSTEMI 2015 in the setting of respiratory failure and pneumonia.  EF at that time was 20%.  Cardiac catheterization was not pursued due to renal dysfunction and AMS.  Subsequent stress imaging supported scar.  Has been on DAPT for history of ICA stenting due to cerebrovascular disease.  In 2018 EF improved to 50 to 55% after ICD was placed, no wall motion abnormalities.  Underwent bilateral lower extremity angiogram with right SFA stenting on February 19, 2023 by Dr. Myra Gianotti. During angiogram, no significant stenosis was found along iliacs, aorta, or left femoropopliteal bypass.   Today he presents for follow-up. Doing well. Denies any chest pain, shortness of breath, palpitations, syncope, presyncope, dizziness, orthopnea, PND, swelling or significant weight changes, acute bleeding, or claudication. Continues to smoke. Shows me BP log that overall shows well controlled blood pressures.   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  ROS:    Please see the history of present illness.    All other systems reviewed and are negative.  EKGs/Labs/Other Studies Reviewed:    The following studies were reviewed today:   EKG:         Echocardiogram 03/2023: 1. Left ventricular ejection fraction, by estimation, is 55 to 60%.  The  left ventricle has normal function. The left ventricle has no regional  wall motion abnormalities. Left ventricular diastolic parameters are  consistent with Grade I diastolic  dysfunction (impaired relaxation). The average left ventricular global  longitudinal strain is -21.6 %. The global longitudinal strain is normal.   2. Right ventricular systolic function was not well visualized. The right  ventricular size is normal. There is normal pulmonary artery systolic  pressure.   3. The mitral valve is normal in structure. Trivial mitral valve  regurgitation. No evidence of mitral stenosis.   4. The aortic valve was not well visualized. Aortic valve regurgitation  is not visualized. No aortic stenosis is present.   5. Aortic dilatation noted. There is borderline dilatation of the aortic  root, measuring 36 mm.   6. The inferior vena cava is normal in size with greater than 50%  respiratory variability, suggesting right atrial pressure of 3 mmHg.   Comparison(s): Echocardiogram done 08/11/17 showed an EF of 50-55%.  Myoview on 08/30/2014: IMPRESSION:  1. Large degree of inferior myocardial scar. No evidence of  ischemia. Mild LV dilatation.   2.  Inferior wall akinesis.   3. Left ventricular ejection fraction 18%   4. High-risk stress test findings* based on severely depressed EF  and degree of myocardial scar.  Physical Exam:    VS:  There were no vitals taken for this visit.    Wt Readings from Last 3 Encounters:  12/04/23 141 lb (64 kg)  11/11/23 148 lb (67.1 kg)  06/27/23  142 lb 3.2 oz (64.5 kg)     GEN: Well nourished, well developed in no acute distress HEENT: Normal NECK: No JVD; No carotid bruits CARDIAC: S1/S2, RRR, no murmurs, rubs, gallops; 2+ pulses RESPIRATORY:  Clear to auscultation without rales, wheezing or rhonchi  MUSCULOSKELETAL:  No edema; No deformity  SKIN: Warm and dry NEUROLOGIC:  Alert and oriented x 3 PSYCHIATRIC:  Normal affect    ASSESSMENT:    No diagnosis found.   PLAN:    In order of problems listed above:  CAD, s/p NSTEMI Stable with no anginal symptoms. No indication for ischemic evaluation. Continue current medication regimen. Heart healthy diet and regular cardiovascular exercise encouraged. Smoking cessation encouraged and discussed - see below.   HLD Most recent LDL 44 11/2022. Being managed by PCP.  Continue current medication regimen. Heart healthy diet and regular cardiovascular exercise encouraged.   PAD Longstanding history. He recent underwent bilateral lower extremity angiogram and is s/p right SFA stenting on February 19, 2023 by Dr. Myra Gianotti. Continue current medication regimen.  Continue to follow-up with Dr. Myra Gianotti.   HFimpEF, s/p BiV ICD, ICM Stage C, NYHA class I-II symptoms. TTE 03/2023 showed EF 55-60%. Euvolemic and well compensated on exam.  Continue aspirin, Coreg, Lipitor, carvedilol, and Entresto. Low sodium diet, fluid restriction <2L, and daily weights encouraged. Educated to contact our office for weight gain of 2 lbs overnight or 5 lbs in one week. Normal device function 03/2023 seen during remote device check. Continue to follow up with EP.   5. HTN Blood pressure stable. Discussed to monitor BP at home at least 2 hours after medications and sitting for 5-10 minutes.  Discussed to obtain OMRON cuff and to notify office is SBP is consistently elevated > 140. No medication changes at this time. Low salt, heart healthy diet and regular cardiovascular exercise encouraged.   6.  Tobacco abuse Smoking cessation encouraged and discussed.  He verbalized understanding.  7. Aortic dilatation Borderline dilatation of aortic root measuring 36 mm noted on echocardiogram 03/2023.  Recommend repeating echocardiogram in 1 year for monitoring.  Care and ED precautions discussed.  8. Disposition: Follow-up with Dr. Dina Rich or APP in 6 months or sooner if anything changes.  Medication  Adjustments/Labs and Tests Ordered: Current medicines are reviewed at length with the patient today.  Concerns regarding medicines are outlined above.  No orders of the defined types were placed in this encounter.  No orders of the defined types were placed in this encounter.   There are no Patient Instructions on file for this visit.   Signed, Sharlene Dory, NP  12/27/2023 12:43 PM    [facility]

## 2023-12-29 ENCOUNTER — Other Ambulatory Visit: Payer: Self-pay | Admitting: Nurse Practitioner

## 2024-01-17 ENCOUNTER — Other Ambulatory Visit: Payer: Self-pay

## 2024-01-17 DIAGNOSIS — I70213 Atherosclerosis of native arteries of extremities with intermittent claudication, bilateral legs: Secondary | ICD-10-CM

## 2024-01-21 ENCOUNTER — Ambulatory Visit (INDEPENDENT_AMBULATORY_CARE_PROVIDER_SITE_OTHER): Payer: Medicare Other

## 2024-01-21 ENCOUNTER — Ambulatory Visit (INDEPENDENT_AMBULATORY_CARE_PROVIDER_SITE_OTHER): Payer: Medicare Other | Admitting: Physician Assistant

## 2024-01-21 DIAGNOSIS — I70213 Atherosclerosis of native arteries of extremities with intermittent claudication, bilateral legs: Secondary | ICD-10-CM | POA: Diagnosis not present

## 2024-01-21 DIAGNOSIS — I739 Peripheral vascular disease, unspecified: Secondary | ICD-10-CM

## 2024-01-21 DIAGNOSIS — I6522 Occlusion and stenosis of left carotid artery: Secondary | ICD-10-CM

## 2024-01-21 LAB — VAS US ABI WITH/WO TBI
Left ABI: 0.83
Right ABI: 0.83

## 2024-01-21 NOTE — Progress Notes (Signed)
 Office Note     CC:  follow up Requesting Provider:  Benita Stabile, MD  HPI: Roy [name] is a 78 y.o. (1946/01/22) male who presents for surveillance of PAD.  He is well-known to [facility] with numerous endovascular and open revascularization surgeries of bilateral lower extremities.  Most recently he underwent right SFA stenting on 02/19/2023 by Dr. Myra Gianotti.  During angiogram there was no hemodynamically significant stenosis of his aorta, iliacs, or left femoral to popliteal bypass.  His prior procedures include:  1) R ICA stent 2009 2) bilateral external iliac artery stenting in 2018  3) left femoral to above-knee popliteal artery bypass graft with GSV in 2018  4) incision and drainage of distal incision seroma in 2018 5) right SFA atherectomy and drug-coated balloon angioplasty, right external iliac angioplasty in 2019  6) right SFA stenting and left common femoral drug-coated balloon angioplasty in 2021 7) right external iliac and femoral artery endarterectomy, right common iliac stenting, right SFA stenting in 2021 8) right external iliac artery stenting, left common femoral artery drug-coated balloon angioplasty in 2021  9) right SFA stenting and left external iliac artery angioplasty in 2022  10) left external iliac artery stenting, left common femoral artery drug-coated balloon angioplasty in 2023  He denies any claudication symptoms in his calves.  He also denies any rest pain or tissue loss of bilateral lower extremities.  He does have severe back pain with history of lumbar spine surgery.  He complains of hip pain bilaterally after walking up and down his driveway.  He plans to see a spine specialist at [facility] in the near future for a second opinion.  He is on aspirin, statin, Brilinta daily.  He smokes about a pack and a half a day with no interest in quitting.  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  Past Medical History:  Diagnosis Date   AICD (automatic  cardioverter/defibrillator) present 08/19/2015   St Jude BiV ICD for primary prevention by Dr. Ladona Ridgel   Alcohol abuse    6 beers per day; hospital admission in 2009 for withdrawal symptoms   Alcoholic cirrhosis (HCC)    Anxiety and depression    denies    Cerebrovascular disease 2009   TIA; 2009- right ICA stent; re-intervention for restenosis complicated by [facility] w/o sx   CHF (congestive heart failure) (HCC) 06-02-14   Chronic obstructive pulmonary disease (HCC)    Degenerative joint disease    Total shoulder arthroplasty-right   Hyperlipidemia    Hypertension    LBBB (left bundle branch block)    Normal echo-2011; stress nuclear in 09/2010--septal hypoperfusion representing nontransmural infarction or the effect of left bundle branch block, no ischemia   Myocardial infarction [facility]) June 02, 2014   Massive Heart Attack   Peripheral vascular disease (HCC)    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   Thrombocytopenia (HCC)    Tobacco abuse    -100 pack years; 1.5 packs per day   Traumatic seroma of thigh (HCC)    left   Tubular adenoma of colon     Past Surgical History:  Procedure Laterality Date   ABDOMINAL AORTAGRAM [name] 05/04/2014   Procedure: ABDOMINAL Ronny Flurry;  Surgeon: Nada Libman, MD;  Location: [facility] CATH LAB;  Service: Cardiovascular;  Laterality: [name];   ABDOMINAL AORTOGRAM [name] 06/25/2017   Procedure: Abdominal Aortogram;  Surgeon: Nada Libman, MD;  [facility] INVASIVE CV LAB;  Service: Cardiovascular;  Laterality: [name];  ABDOMINAL AORTOGRAM W/LOWER EXTREMITY [name] 04/22/2018   Procedure: ABDOMINAL AORTOGRAM W/LOWER EXTREMITY;  Surgeon: Nada Libman, MD;  [facility] INVAS[facility] CV LAB;  Service: Cardiovascular;  Laterality: [name];   ABDOMINAL AORTOGRAM W/LOWER EXTREMITY [name] 03/08/2020   Procedure: ABDOMINAL AORTOGRAM W/LOWER EXTREMITY;  Surgeon: Nada Libman, MD;  [facility] INVAS[facility] CV LAB;  Service: Cardiovascular;  Laterality: [name];   ABDOMINAL AORTOGRAM  W/LOWER EXTREMITY Bilateral 08/23/2020   Procedure: ABDOMINAL AORTOGRAM W/LOWER EXTREMITY;  Surgeon: Nada Libman, MD;  [facility] INVAS[facility] CV LAB;  Service: Cardiovascular;  Laterality: Bilateral;   ABDOMINAL AORTOGRAM W/LOWER EXTREMITY [name] 10/31/2021   Procedure: ABDOMINAL AORTOGRAM W/LOWER EXTREMITY;  Surgeon: Nada Libman, MD;  [facility];  Service: Cardiovascular;  Laterality: [name];   ABDOMINAL AORTOGRAM W/LOWER EXTREMITY [name] 12/19/2021   Procedure: ABDOMINAL AORTOGRAM W/LOWER EXTREMITY;  Surgeon: Nada Libman, MD;  [facility];  Service: Cardiovascular;  Laterality: [name];   ABDOMINAL AORTOGRAM W/LOWER EXTREMITY Bilateral 02/19/2023   Procedure: ABDOMINAL AORTOGRAM W/LOWER EXTREMITY;  Surgeon: Nada Libman, MD;  [facility];  Service: Cardiovascular;  Laterality: Bilateral;   AGILE CAPSULE [name] 03/18/2014   Procedure: AGILE CAPSULE;  Surgeon: West Bali, MD;  [facility];  Service: Endoscopy;  Laterality: [name];  7:30   APPLICATION OF WOUND VAC Left 09/03/2017   Procedure: APPLICATION OF WOUND VAC;  Surgeon: Fransisco Hertz, MD;  Location: [facility] OR;  Service: Vascular;  Laterality: Left;   APPLICATION OF WOUND VAC Right 04/07/2020   Procedure: PLACEMENT OF ANTIBIOTIC BEADS AND APPLICATION OF WOUND VAC;  Surgeon: Nada Libman, MD;  [facility];  Service: Vascular;  Laterality: Right;   BACK SURGERY     BACTERIAL OVERGROWTH TEST [name] 05/24/2015   Procedure: BACTERIAL OVERGROWTH TEST;  Surgeon: West Bali, MD;  [facility];  Service: Endoscopy;  Laterality: [name];  0700   BI-VENTRICULAR IMPLANTABLE CARDIOVERTER DEFIBRILLATOR  (CRT-D)  08/19/2015   BIV ICD GENERATOR CHANGEOUT [name] 11/11/2023   Procedure: BIV ICD GENERATOR CHANGEOUT;  Surgeon: Marinus Maw, MD;  Location: [facility] INVASIVE CV LAB;  Service: Cardiovascular;  Laterality: [name];   CARPAL TUNNEL RELEASE Left 02/02/2016   Procedure: LEFT CARPAL TUNNEL  RELEASE;  Surgeon: Cindee Salt, MD;  Location: [facility];  Service: Orthopedics;  Laterality: Left;  ANESTHESIA: IV REGIONAL UPPER ARM   CATARACT EXTRACTION W/PHACO Right 03/08/2015   Procedure: CATARACT EXTRACTION PHACO AND INTRAOCULAR LENS PLACEMENT (IOC);  Surgeon: Jethro Bolus, MD;  Location: [facility] ORS;  Service: Ophthalmology;  Laterality: Right;  CDE:9.46   CATARACT EXTRACTION W/PHACO Left 03/22/2015   Procedure: CATARACT EXTRACTION PHACO AND INTRAOCULAR LENS PLACEMENT (IOC);  Surgeon: Jethro Bolus, MD;  Location: [facility] OR[facility];  Service: Ophthalmology;  Laterality: Left;  CDE:5.80   COLONOSCOPY  08/22/09   Fields-(Tubular Adenoma)3-mm transverse polyp/4-mm polyp otherwise noraml/small internal hemorrhoids   COLONOSCOPY [name] 04/14/2014   WUJ:WJXBJ internal hemorrhids/normal mocsa in the terminal iluem/left colonis redundant   COLONOSCOPY W/ POLYPECTOMY  2011   ENDARTERECTOMY FEMORAL Left 08/16/2017   Procedure: ENDARTERECTOMY LEFT PROFUNDA FEMORAL;  Surgeon: Nada Libman, MD;  Location: [facility] OR;  Service: Vascular;  Laterality: Left;   ENDARTERECTOMY FEMORAL Right 03/31/2020   Procedure: RIGHT FEMORAL ENDARTERECTOMY;  Surgeon: Nada Libman, MD;  [facility];  Service: Vascular;  Laterality: Right;   EP IMPLANTABLE DEVICE [name] 08/19/2015   Procedure: BiV ICD Insertion CRT-D;  Surgeon: Marinus Maw, MD;  [facility];  Service: Cardiovascular;  Laterality: [name];   ESOPHAGOGASTRODUODENOSCOPY [name] 03/05/2014   SLF: 1. Stricture at the gastroesophagael junction 2. Small hiatal hernia 3. Moderate non-erosive gastritis and duodentitis. 4. No surce for Melena identified.    FEMORAL-POPLITEAL BYPASS GRAFT Left 08/16/2017   Procedure: LEFT  FEMORAL-POPLITEAL ARTERY  BYPASS GRAFT;  Surgeon: Nada Libman, MD;  [facility];  Service: Vascular;  Laterality: Left;   GIVENS CAPSULE STUDY [name] 03/30/2014   Procedure: GIVENS CAPSULE STUDY;  Surgeon: West Bali, MD;  [facility]  END[facility];  Service: Endoscopy;  Laterality: [name];  7:30   GROIN DEBRIDEMENT Right 04/07/2020   Procedure: INCISION AND DRAINAGE OF RIGHT GROIN;  Surgeon: Nada Libman, MD;  [facility];  Service: Vascular;  Laterality: Right;   I & D EXTREMITY Left 09/03/2017   Procedure: IRRIGATION AND DEBRIDEMENT EXTREMITY LEFT THIGH SEROMA;  Surgeon: Fransisco Hertz, MD;  [facility];  Service: Vascular;  Laterality: Left;   INSERT / REPLACE / REMOVE PACEMAKER     INSERTION OF ILIAC STENT  03/31/2020   Procedure: INSERTION OF RIGHT ILIAC ARTERY STENT AND RIGHT SUPERFICIAL FEMORAL ARTERY STENT;  Surgeon: Nada Libman, MD;  [facility];  Service: Vascular;;   IR ANGIO INTRA EXTRACRAN SEL COM CAROTID INNOMINATE BILAT MOD SED  07/16/2017   IR ANGIO INTRA EXTRACRAN SEL COM CAROTID INNOMINATE BILAT MOD SED  06/12/2019   IR ANGIO INTRA EXTRACRAN SEL COM CAROTID INNOMINATE BILAT MOD SED  01/17/2021   IR ANGIO VERTEBRAL SEL SUBCLAVIAN INNOMINATE BILAT MOD SED  07/16/2017   IR ANGIO VERTEBRAL SEL SUBCLAVIAN INNOMINATE BILAT MOD SED  06/12/2019   IR ANGIO VERTEBRAL SEL SUBCLAVIAN INNOMINATE BILAT MOD SED  01/17/2021   IR RADIOLOGIST EVAL & MGMT  04/01/2017   IR RADIOLOGIST EVAL & MGMT  08/02/2017   IR TRANSCATH EXCRAN VERT OR CAR A STENT  07/22/2019   IR US GUIDE VASC ACCESS RIGHT  06/12/2019   IR US GUIDE VASC ACCESS RIGHT  01/17/2021   JOINT REPLACEMENT Right    Total Shoulder Replacement    LOWER EXTREMITY ANGIOGRAPHY Bilateral 06/25/2017   Procedure: Lower Extremity Angiography;  Surgeon: Nada Libman, MD;  [facility];  Service: Cardiovascular;  Laterality: Bilateral;   LUMBAR FUSION  2010   PATCH ANGIOPLASTY Right 03/31/2020   Procedure: PATCH ANGIOPLASTY USING XENOSURE BIOLOGIC PATCH 1cm x 14cm;  Surgeon: Nada Libman, MD;  Location: [facility] OR;  Service: Vascular;  Laterality: Right;   PERIPHERAL VASCULAR ATHERECTOMY Right 04/22/2018   Procedure: PERIPHERAL VASCULAR ATHERECTOMY;  Surgeon:  Nada Libman, MD;  [facility];  Service: Cardiovascular;  Laterality: Right;  right superficial femoral   PERIPHERAL VASCULAR BALLOON ANGIOPLASTY Right 04/22/2018   Procedure: PERIPHERAL VASCULAR BALLOON ANGIOPLASTY;  Surgeon: Nada Libman, MD;  [facility];  Service: Cardiovascular;  Laterality: Right;  external iliac   PERIPHERAL VASCULAR BALLOON ANGIOPLASTY Left 03/08/2020   Procedure: PERIPHERAL VASCULAR BALLOON ANGIOPLASTY;  Surgeon: Nada Libman, MD;  [facility];  Service: Cardiovascular;  Laterality: Left;  Common Femoral   PERIPHERAL VASCULAR BALLOON ANGIOPLASTY Left 08/23/2020   Procedure: PERIPHERAL VASCULAR BALLOON ANGIOPLASTY;  Surgeon: Nada Libman, MD;  [facility];  Service: Cardiovascular;  Laterality: Left;  common femoral   PERIPHERAL VASCULAR BALLOON ANGIOPLASTY Left 10/31/2021   Procedure: PERIPHERAL VASCULAR BALLOON ANGIOPLASTY;  Surgeon: Nada Libman,  MD;  [facility];  Service: Cardiovascular;  Laterality: Left;  external iliac   PERIPHERAL VASCULAR INTERVENTION Right 03/08/2020   Procedure: PERIPHERAL VASCULAR INTERVENTION;  Surgeon: Nada Libman, MD;  [facility];  Service: Cardiovascular;  Laterality: Right;  SFA   PERIPHERAL VASCULAR INTERVENTION Right 08/23/2020   Procedure: PERIPHERAL VASCULAR INTERVENTION;  Surgeon: Nada Libman, MD;  [facility];  Service: Cardiovascular;  Laterality: Right;  external iliac   PERIPHERAL VASCULAR INTERVENTION Right 10/31/2021   Procedure: PERIPHERAL VASCULAR INTERVENTION;  Surgeon: Nada Libman, MD;  [facility];  Service: Cardiovascular;  Laterality: Right;  superficial femoral artery   PERIPHERAL VASCULAR INTERVENTION Left 12/19/2021   Procedure: PERIPHERAL VASCULAR INTERVENTION;  Surgeon: Nada Libman, MD;  [facility];  Service: Cardiovascular;   Laterality: Left;  stent lt ext iliac / pta common femoral   PERIPHERAL VASCULAR INTERVENTION  02/19/2023   Procedure: PERIPHERAL VASCULAR INTERVENTION;  Surgeon: Nada Libman, MD;  [facility];  Service: Cardiovascular;;   RADIOLOGY WITH ANESTHESIA [name] 07/01/2019   Procedure: STENTING;  Surgeon: Julieanne Cotton, MD;  [facility];  Service: Radiology;  Laterality: [name];   RADIOLOGY WITH ANESTHESIA [name] 07/22/2019   Procedure: STENTING;  Surgeon: Julieanne Cotton, MD;  [facility] OR;  Service: Radiology;  Laterality: [name];   TOTAL SHOULDER ARTHROPLASTY Right 2011   Dr. Trudee Kuster NERVE TRANSPOSITION Left 02/02/2016   Procedure: LEFT IN-SITU DECOMPRESSION ULNAR NERVE ;  Surgeon: Cindee Salt, MD;  Location: [facility];  Service: Orthopedics;  Laterality: Left;   ULNAR TUNNEL RELEASE Left 02/02/2016   Procedure: LEFT CUBITAL TUNNEL RELEASE;  Surgeon: Cindee Salt, MD;  Location: [facility];  Service: Orthopedics;  Laterality: Left;   VASECTOMY  1971   VEIN HARVEST Left 08/16/2017   Procedure: USING NON REVERSE LEFT GREATER SAPHENOUS VEIN HARVEST;  Surgeon: Nada Libman, MD;  [facility];  Service: Vascular;  Laterality: Left;    Social History   Socioeconomic History   Marital status: Married    Spouse name: Lam Mccubbins   Number of children: 2   Years of education: 12   Highest education level: 12th grade  Occupational History   Occupation: retired, Personnel officer    Comment: Architect: RETIRED  Tobacco Use   Smoking status: Every Day    Current packs/day: 1.00    Average packs/day: 1 pack/day for 66.4 years (66.4 ttl pk-yrs)    Types: Cigarettes    Start date: 08/20/1957    Passive exposure: Current   Smokeless tobacco: Never  Vaping Use   Vaping status: Former   Start date: 09/10/2016   Quit date: 10/11/2016  Substance and Sexual Activity   Alcohol use: No    Alcohol/week: 0.0 standard drinks of alcohol     Comment: quit in July 2015   Drug use: No   Sexual activity: Not Currently  Other Topics Concern   Not on file  Social History Narrative   Accompanied by daughter Milagros Reap   Lives w/ wife, daughter   Social Drivers of Health   Financial Resource Strain: Low Risk  (06/21/2022)   Overall Financial Resource Strain (CARDIA)    Difficulty of Paying Living Expenses: Not hard at all  Food Insecurity: No Food Insecurity (06/21/2022)   Hunger Vital Sign    Worried About Running Out of Food in the  Last Year: Never true    Ran Out of Food in the Last Year: Never true  Transportation Needs: No Transportation Needs (06/21/2022)   PRAPARE - Administrator, Civil Service (Medical): No    Lack of Transportation (Non-Medical): No  Physical Activity: Inactive (06/21/2022)   Exercise Vital Sign    Days of Exercise per Week: 0 days    Minutes of Exercise per Session: 0 min  Stress: No Stress Concern Present (06/21/2022)   [employer] of Occupational Health - Occupational Stress Questionnaire    Feeling of Stress : Not at all  Social Connections: Moderately Integrated (06/21/2022)   Social Connection and Isolation Panel [NHANES]    Frequency of Communication with Friends and Family: More than three times a week    Frequency of Social Gatherings with Friends and Family: More than three times a week    Attends Religious Services: More than 4 times per year    Active Member of [employer] or Organizations: No    Attends Banker Meetings: Never    Marital Status: Married  Catering manager Violence: Not At Risk (06/21/2022)   Humiliation, Afraid, Rape, and Kick questionnaire    Fear of Current or Ex-Partner: No    Emotionally Abused: No    Physically Abused: No    Sexually Abused: No    Family History  Problem Relation Age of Onset   Coronary artery disease Mother    Diabetes Mother    Heart disease Mother        Before age 38 - 5 Bypasses   Hypertension Mother     Heart attack Mother        3-4 Heart attacks   Alzheimer's disease Father    Diabetes Father    Heart attack Sister    Cancer Brother        Prostate   Hyperlipidemia Son    Prostate cancer Brother    Alzheimer's disease Sister    Colon cancer Neg Hx     Current Outpatient Medications  Medication Sig Dispense Refill   aspirin EC 81 MG tablet Take 81 mg by mouth every evening.      atorvastatin (LIPITOR) 20 MG tablet Take 20 mg by mouth every evening.     BRILINTA 90 MG TABS tablet Take 90 mg by mouth 2 (two) times daily.     carvedilol (COREG) 6.25 MG tablet TAKE 1 TABLET TWICE A DAY WITH MEALS (DOSE CHANGE, NEW PRESCRIPTION) 180 tablet 1   cetirizine (ZYRTEC) 10 MG tablet Take 10 mg by mouth daily.     Cobalamin Combinations (NEURIVA PLUS) CAPS Take 1 capsule by mouth daily.     dextromethorphan-guaiFENesin (MUCINEX DM) 30-600 MG 12hr tablet Take 1 tablet by mouth 2 (two) times daily.     gabapentin (NEURONTIN) 300 MG capsule Take 300 mg by mouth 2 (two) times daily.      HYDROcodone-acetaminophen (NORCO/VICODIN) 5-325 MG tablet Take 1-2 tablets by mouth See admin instructions. Take 2 tablets in the morning and evening, may take 1 tablet at lunch as needed for pain     Magnesium 250 MG TABS Take 250 mg by mouth every evening.     Multiple Vitamins-Minerals (CENTRUM SILVER PO) Take 1 tablet by mouth daily. 50+     Naphazoline HCl (CLEAR EYES OP) Place 1 drop into both eyes daily as needed (dry eyes).     Omega-3 Fatty Acids (FISH OIL) 1000 MG CAPS Take 1,000 mg by mouth every  evening.     sacubitril-valsartan (ENTRESTO) 97-103 MG TAKE 1 TABLET TWICE A DAY (OVERDUE FOR APPOINTMENT) 180 tablet 1   Salicylic Acid (GOLD BOND PSORIASIS RELIEF) 3 % CREA Apply 1 application topically daily as needed (psoriasis).     silodosin (RAPAFLO) 8 MG CAPS capsule Take 8 mg by mouth daily with breakfast.     SUPER B COMPLEX/C PO Take 1 tablet by mouth every evening.     tamsulosin (FLOMAX) 0.4 MG CAPS  capsule Take 0.4 mg by mouth daily after supper.     traMADol (ULTRAM) 50 MG tablet Take 50 mg by mouth 2 (two) times daily as needed for moderate pain (pain score 4-6).     No current facility-administered medications for this visit.    No Known Allergies   REVIEW OF SYSTEMS:   [X]  denotes positive finding, [ ]  denotes negative finding Cardiac  Comments:  Chest pain or chest pressure:    Shortness of breath upon exertion:    Short of breath when lying flat:    Irregular heart rhythm:        Vascular    Pain in calf, thigh, or hip brought on by ambulation:    Pain in feet at night that wakes you up from your sleep:     Blood clot in your veins:    Leg swelling:         Pulmonary    Oxygen at home:    Productive cough:     Wheezing:         Neurologic    Sudden weakness in arms or legs:     Sudden numbness in arms or legs:     Sudden onset of difficulty speaking or slurred speech:    Temporary loss of vision in one eye:     Problems with dizziness:         Gastrointestinal    Blood in stool:     Vomited blood:         Genitourinary    Burning when urinating:     Blood in urine:        Psychiatric    Major depression:         Hematologic    Bleeding problems:    Problems with blood clotting too easily:        Skin    Rashes or ulcers:        Constitutional    Fever or chills:      PHYSICAL EXAMINATION:  There were no vitals filed for this visit.  General:  WDWN in NAD; vital signs documented above Gait: Not observed HENT: WNL, normocephalic Pulmonary: normal non-labored breathing , without Rales, rhonchi,  wheezing Cardiac: regular HR Abdomen: soft, NT, no masses Skin: without rashes Vascular Exam/Pulses: Palpable 2+ PT pulses bilaterally; 2+ right femoral pulse Extremities: without ischemic changes, without Gangrene , without cellulitis; without open wounds;  Musculoskeletal: no muscle wasting or atrophy  Neurologic: A&O X 3 Psychiatric:  The pt  has Normal affect.   Non-Invasive Vascular Imaging:   Aortoiliac duplex demonstrates widely patent iliac artery stents without significant stenosis  Left femoral to popliteal bypass widely patent  Right common femoral artery with 50% stenosis; elevated velocities in the proximal right SFA stent and profunda  ABIs and TBI's are unchanged ABI/TBIToday's ABIToday's TBIPrevious ABIPrevious TBI  +-------+-----------+-----------+------------+------------+  Right 0.83       0.73       0.83        0.62          +-------+-----------+-----------+------------+------------+  Left  0.83       0.69       0.86        0.67            ASSESSMENT/PLAN:: 78 y.o. male here for follow up for surveillance of PAD with numerous endovascular and open revascularization surgeries in the past  Bilateral lower extremities are well-perfused on exam with 2+ palpable PT pulses.  ABIs and TBI's are stable compared to 6 months ago.  He denies any calf claudication as well as rest pain or tissue loss.  Aortoiliac duplex is without hemodynamically significant stenosis.  Left leg bypass duplex demonstrates a widely patent bypass graft.  Right leg arterial duplex shows elevated velocities within the stent placed at the SFA origin as well as in the profunda.  He has a strong right femoral pulse.  We will repeat right lower extremity arterial duplex and ABI in 3 months to reevaluate proximal SFA stent and profunda elevated velocities.  He will continue his aspirin, Brilinta, statin daily.  He will notify our office if he develops claudication, rest pain, or tissue loss in the meantime.  We discussed the strong possibility he may need repeat angiography to better evaluate this area.  It should also be noted that the patient's cardiologist has ordered a carotid duplex.  He has a history of right ICA stenting.   Emilie Rutter, PA-C [facility] [phone]

## 2024-02-11 ENCOUNTER — Ambulatory Visit (INDEPENDENT_AMBULATORY_CARE_PROVIDER_SITE_OTHER): Payer: Medicare Other

## 2024-02-11 DIAGNOSIS — I255 Ischemic cardiomyopathy: Secondary | ICD-10-CM | POA: Diagnosis not present

## 2024-02-12 ENCOUNTER — Encounter: Payer: Self-pay | Admitting: Internal Medicine

## 2024-02-12 LAB — CUP PACEART REMOTE DEVICE CHECK
Battery Remaining Longevity: 80 mo
Battery Remaining Percentage: 94 %
Battery Voltage: 3.01 V
Brady Statistic AP VP Percent: 57 %
Brady Statistic AP VS Percent: 1 %
Brady Statistic AS VP Percent: 42 %
Brady Statistic AS VS Percent: 1 %
Brady Statistic RA Percent Paced: 56 %
Date Time Interrogation Session: 20250318030609
HighPow Impedance: 70 Ohm
Implantable Lead Connection Status: 753985
Implantable Lead Connection Status: 753985
Implantable Lead Connection Status: 753985
Implantable Lead Implant Date: 20160923
Implantable Lead Implant Date: 20160923
Implantable Lead Implant Date: 20160923
Implantable Lead Location: 753858
Implantable Lead Location: 753859
Implantable Lead Location: 753860
Implantable Lead Model: 7122
Implantable Pulse Generator Implant Date: 20241216
Lead Channel Impedance Value: 440 Ohm
Lead Channel Impedance Value: 480 Ohm
Lead Channel Impedance Value: 840 Ohm
Lead Channel Pacing Threshold Amplitude: 0.75 V
Lead Channel Pacing Threshold Amplitude: 1 V
Lead Channel Pacing Threshold Amplitude: 1.25 V
Lead Channel Pacing Threshold Pulse Width: 0.5 ms
Lead Channel Pacing Threshold Pulse Width: 0.5 ms
Lead Channel Pacing Threshold Pulse Width: 0.8 ms
Lead Channel Sensing Intrinsic Amplitude: 12 mV
Lead Channel Sensing Intrinsic Amplitude: 3.8 mV
Lead Channel Setting Pacing Amplitude: 2 V
Lead Channel Setting Pacing Amplitude: 2.5 V
Lead Channel Setting Pacing Amplitude: 2.5 V
Lead Channel Setting Pacing Pulse Width: 0.5 ms
Lead Channel Setting Pacing Pulse Width: 0.8 ms
Lead Channel Setting Sensing Sensitivity: 0.6 mV
Pulse Gen Serial Number: 111071805
Zone Setting Status: 755011

## 2024-02-18 ENCOUNTER — Ambulatory Visit: Payer: Medicare Other | Admitting: Internal Medicine

## 2024-02-19 ENCOUNTER — Ambulatory Visit: Payer: Medicare Other | Admitting: Student

## 2024-03-19 ENCOUNTER — Other Ambulatory Visit (HOSPITAL_COMMUNITY): Payer: Self-pay | Admitting: Internal Medicine

## 2024-03-19 DIAGNOSIS — G8929 Other chronic pain: Secondary | ICD-10-CM | POA: Diagnosis not present

## 2024-03-19 DIAGNOSIS — M545 Low back pain, unspecified: Secondary | ICD-10-CM | POA: Diagnosis not present

## 2024-03-19 DIAGNOSIS — Z79899 Other long term (current) drug therapy: Secondary | ICD-10-CM | POA: Diagnosis not present

## 2024-03-19 DIAGNOSIS — F17210 Nicotine dependence, cigarettes, uncomplicated: Secondary | ICD-10-CM

## 2024-03-19 DIAGNOSIS — N401 Enlarged prostate with lower urinary tract symptoms: Secondary | ICD-10-CM | POA: Diagnosis not present

## 2024-03-19 DIAGNOSIS — I77819 Aortic ectasia, unspecified site: Secondary | ICD-10-CM | POA: Diagnosis not present

## 2024-03-19 DIAGNOSIS — I739 Peripheral vascular disease, unspecified: Secondary | ICD-10-CM | POA: Diagnosis not present

## 2024-03-19 DIAGNOSIS — I10 Essential (primary) hypertension: Secondary | ICD-10-CM | POA: Diagnosis not present

## 2024-03-19 DIAGNOSIS — I5022 Chronic systolic (congestive) heart failure: Secondary | ICD-10-CM | POA: Diagnosis not present

## 2024-03-19 DIAGNOSIS — R0789 Other chest pain: Secondary | ICD-10-CM | POA: Diagnosis not present

## 2024-03-19 DIAGNOSIS — I110 Hypertensive heart disease with heart failure: Secondary | ICD-10-CM | POA: Diagnosis not present

## 2024-03-27 NOTE — Addendum Note (Signed)
 Addended by: Lott Rouleau A on: 03/27/2024 02:40 PM   Modules accepted: Orders

## 2024-03-27 NOTE — Progress Notes (Signed)
 Remote ICD transmission.

## 2024-04-07 ENCOUNTER — Ambulatory Visit: Attending: Internal Medicine | Admitting: Internal Medicine

## 2024-04-07 VITALS — BP 124/58 | HR 60 | Ht 63.0 in | Wt 146.0 lb

## 2024-04-07 DIAGNOSIS — Z95810 Presence of automatic (implantable) cardiac defibrillator: Secondary | ICD-10-CM | POA: Insufficient documentation

## 2024-04-07 DIAGNOSIS — I5022 Chronic systolic (congestive) heart failure: Secondary | ICD-10-CM | POA: Diagnosis not present

## 2024-04-07 LAB — CUP PACEART INCLINIC DEVICE CHECK
Battery Remaining Longevity: 79 mo
Brady Statistic RA Percent Paced: 52 %
Brady Statistic RV Percent Paced: 99.17 %
Date Time Interrogation Session: 20250513124606
HighPow Impedance: 69.75 Ohm
Implantable Lead Connection Status: 753985
Implantable Lead Connection Status: 753985
Implantable Lead Connection Status: 753985
Implantable Lead Implant Date: 20160923
Implantable Lead Implant Date: 20160923
Implantable Lead Implant Date: 20160923
Implantable Lead Location: 753858
Implantable Lead Location: 753859
Implantable Lead Location: 753860
Implantable Lead Model: 7122
Implantable Pulse Generator Implant Date: 20241216
Lead Channel Impedance Value: 450 Ohm
Lead Channel Impedance Value: 475 Ohm
Lead Channel Impedance Value: 862.5 Ohm
Lead Channel Pacing Threshold Amplitude: 0.75 V
Lead Channel Pacing Threshold Amplitude: 0.75 V
Lead Channel Pacing Threshold Amplitude: 1 V
Lead Channel Pacing Threshold Amplitude: 1 V
Lead Channel Pacing Threshold Amplitude: 1.5 V
Lead Channel Pacing Threshold Amplitude: 1.5 V
Lead Channel Pacing Threshold Pulse Width: 0.5 ms
Lead Channel Pacing Threshold Pulse Width: 0.5 ms
Lead Channel Pacing Threshold Pulse Width: 0.5 ms
Lead Channel Pacing Threshold Pulse Width: 0.5 ms
Lead Channel Pacing Threshold Pulse Width: 0.8 ms
Lead Channel Pacing Threshold Pulse Width: 0.8 ms
Lead Channel Sensing Intrinsic Amplitude: 12 mV
Lead Channel Sensing Intrinsic Amplitude: 4.1 mV
Lead Channel Setting Pacing Amplitude: 2 V
Lead Channel Setting Pacing Amplitude: 2.5 V
Lead Channel Setting Pacing Amplitude: 2.5 V
Lead Channel Setting Pacing Pulse Width: 0.5 ms
Lead Channel Setting Pacing Pulse Width: 0.8 ms
Lead Channel Setting Sensing Sensitivity: 0.6 mV
Pulse Gen Serial Number: 111071805
Zone Setting Status: 755011

## 2024-04-07 MED ORDER — DOXYCYCLINE HYCLATE 100 MG PO CAPS: 100.0000 mg | ORAL_CAPSULE | Freq: Two times a day (BID) | ORAL | 0 refills | Status: DC

## 2024-04-07 NOTE — Patient Instructions (Addendum)
 Medication Instructions:  Your physician has recommended you make the following change in your medication:  Start doxycycline 100mg   twice daily for the next 7 days.  Lab Work: None ordered.  You may go to any [facility] Location for your lab work:  [facility] - [address] ([facility]) - [address] [phone] [address]  [place] - [address]   [place]  - [address]   [place] - [address] - [address] [employer]  - [address] [place] ([facility]   If you have labs (blood work) drawn today and your tests are completely normal, you will receive your results only by: [employer] (if you have MyChart)  If you have any lab test that is abnormal or we need to change your treatment, we will call you or send a MyChart message to review the results.  Testing/Procedures: None ordered.  Follow-Up: At [facility], you and your health needs are our priority.  As part of our continuing mission to provide you with exceptional heart care, we have created designated Provider Care Teams.  These Care Teams include your primary Cardiologist (physician) and Advanced Practice Providers (APPs -  Physician Assistants and Nurse Practitioners) who all work together to provide you with the care you need, when you need it.  Your next appointment:   1 year(s)  The format for your next appointment:   In Person  Provider:   Manya Sells, MD{or one of the following Advanced Practice Providers on your designated Care Team:   Mertha Abrahams, [place] Bambi Lever "Jonelle Neri" [place], [place] Neda Balk, NP  Note: Remote monitoring is used to monitor your Pacemaker/ ICD from home. This monitoring reduces the number of office visits required to check your device to one time per year. It allows us  to keep an eye on the functioning of your device to ensure it is working  properly.

## 2024-04-07 NOTE — Progress Notes (Signed)
 HPI [name] returns today for followup. He is a pleasant 78 yo man with a h/o chronic systolic heart failure, s/p biv ICD insertion. He has had some noise on his ICD lead and we have reprogrammed his device. He has not had syncope. When we saw him over a year ago, we gave him a magnet to keep with him. He has not had chest pain or sob.  He has undergone ICD gen change out. We attempted a new lead but he had multiple occlusions in the innominant and subclavian and a new lead could not be inserted. He has no complaints today.  No Known Allergies   Current Outpatient Medications  Medication Sig Dispense Refill   aspirin  EC 81 MG tablet Take 81 mg by mouth every evening.      atorvastatin  (LIPITOR ) 20 MG tablet Take 20 mg by mouth every evening.     BRILINTA  90 MG TABS tablet Take 90 mg by mouth 2 (two) times daily.     carvedilol  (COREG ) 6.25 MG tablet TAKE 1 TABLET TWICE A DAY WITH MEALS (DOSE CHANGE, NEW PRESCRIPTION) 180 tablet 1   cetirizine (ZYRTEC) 10 MG tablet Take 10 mg by mouth daily.     Cobalamin Combinations (NEURIVA PLUS) CAPS Take 1 capsule by mouth daily.     dextromethorphan -guaiFENesin  (MUCINEX  DM) 30-600 MG 12hr tablet Take 1 tablet by mouth 2 (two) times daily.     gabapentin  (NEURONTIN ) 300 MG capsule Take 300 mg by mouth 2 (two) times daily.      HYDROcodone -acetaminophen  (NORCO/VICODIN) 5-325 MG tablet Take 1-2 tablets by mouth See admin instructions. Take 2 tablets in the morning and evening, may take 1 tablet at lunch as needed for pain     Magnesium  250 MG TABS Take 250 mg by mouth every evening.     Multiple Vitamins-Minerals (CENTRUM SILVER PO) Take 1 tablet by mouth daily. 50+     Naphazoline HCl (CLEAR EYES OP) Place 1 drop into both eyes daily as needed (dry eyes).     Omega-3 Fatty Acids (FISH OIL ) 1000 MG CAPS Take 1,000 mg by mouth every evening.     sacubitril -valsartan  (ENTRESTO ) 97-103 MG TAKE 1 TABLET TWICE A DAY (OVERDUE FOR APPOINTMENT) 180 tablet 1    Salicylic Acid (GOLD BOND PSORIASIS RELIEF) 3 % CREA Apply 1 application topically daily as needed (psoriasis).     silodosin (RAPAFLO) 8 MG CAPS capsule Take 8 mg by mouth daily with breakfast.     SUPER B COMPLEX/C PO Take 1 tablet by mouth every evening.     tamsulosin  (FLOMAX ) 0.4 MG CAPS capsule Take 0.4 mg by mouth daily after supper.     traMADol (ULTRAM) 50 MG tablet Take 50 mg by mouth 2 (two) times daily as needed for moderate pain (pain score 4-6).     No current facility-administered medications for this visit.     Past Medical History:  Diagnosis Date   AICD (automatic cardioverter/defibrillator) present 08/19/2015   St Jude BiV ICD for primary prevention by Dr. Carolynne Citron   Alcohol abuse    6 beers per day; hospital admission in 2009 for withdrawal symptoms   Alcoholic cirrhosis (HCC)    Anxiety and depression    denies    Cerebrovascular disease 2009   TIA; 2009- right ICA stent; re-intervention for restenosis complicated by [facility] w/o sx   CHF (congestive heart failure) (HCC) 06-02-14   Chronic obstructive pulmonary disease (HCC)    Degenerative joint  disease    Total shoulder arthroplasty-right   Hyperlipidemia    Hypertension    LBBB (left bundle branch block)    Normal echo-2011; stress nuclear in 09/2010--septal hypoperfusion representing nontransmural infarction or the effect of left bundle branch block, no ischemia   Myocardial infarction [facility]) June 02, 2014   Massive Heart Attack   Peripheral vascular disease (HCC)    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   Thrombocytopenia (HCC)    Tobacco abuse    -100 pack years; 1.5 packs per day   Traumatic seroma of thigh (HCC)    left   Tubular adenoma of colon     ROS:   All systems reviewed and negative except as noted in the HPI.   Past Surgical History:  Procedure Laterality Date   ABDOMINAL AORTAGRAM [name] 05/04/2014   Procedure: ABDOMINAL AORTAGRAM;  Surgeon: Margherita Shell, MD;  Location: [facility] CATH  LAB;  Service: Cardiovascular;  Laterality: [name];   ABDOMINAL AORTOGRAM [name] 06/25/2017   Procedure: Abdominal Aortogram;  Surgeon: Margherita Shell, MD;  Location: [facility] INVASIVE CV LAB;  Service: Cardiovascular;  Laterality: [name];   ABDOMINAL AORTOGRAM W/LOWER EXTREMITY [name] 04/22/2018   Procedure: ABDOMINAL AORTOGRAM W/LOWER EXTREMITY;  Surgeon: Margherita Shell, MD;  Location: [facility] INVASIVE CV LAB;  Service: Cardiovascular;  Laterality: [name];   ABDOMINAL AORTOGRAM W/LOWER EXTREMITY [name] 03/08/2020   Procedure: ABDOMINAL AORTOGRAM W/LOWER EXTREMITY;  Surgeon: Margherita Shell, MD;  Location: [facility] INVASIVE CV LAB;  Service: Cardiovascular;  Laterality: [name];   ABDOMINAL AORTOGRAM W/LOWER EXTREMITY Bilateral 08/23/2020   Procedure: ABDOMINAL AORTOGRAM W/LOWER EXTREMITY;  Surgeon: Margherita Shell, MD;  [facility]: [facility] INVASIVE CV LAB;  Service: Cardiovascular;  Laterality: Bilateral;   ABDOMINAL AORTOGRAM W/LOWER EXTREMITY [name] 10/31/2021   Procedure: ABDOMINAL AORTOGRAM W/LOWER EXTREMITY;  Surgeon: Margherita Shell, MD;  [facility]: [facility]AS[facility] CV LAB;  Service: Cardiovascular;  Laterality: [name];   ABDOMINAL AORTOGRAM W/LOWER EXTREMITY [name] 12/19/2021   Procedure: ABDOMINAL AORTOGRAM W/LOWER EXTREMITY;  Surgeon: Margherita Shell, MD;  [facility];  Service: Cardiovascular;  Laterality: [name];   ABDOMINAL AORTOGRAM W/LOWER EXTREMITY Bilateral 02/19/2023   Procedure: ABDOMINAL AORTOGRAM W/LOWER EXTREMITY;  Surgeon: Margherita Shell, MD;  [facility] LAB;  Service: Cardiovascular;  Laterality: Bilateral;   AGILE CAPSULE [name] 03/18/2014   Procedure: AGILE CAPSULE;  Surgeon: Alyce Jubilee, MD;  Location: [facility];  Service: Endoscopy;  Laterality: [name];  7:30   APPLICATION OF WOUND VAC Left 09/03/2017   Procedure: APPLICATION OF WOUND VAC;  Surgeon: Arvil Lauber, MD;  Location: [facility] OR;  Service: Vascular;  Laterality: Left;   APPLICATION OF WOUND VAC Right 04/07/2020   Procedure: PLACEMENT OF  ANTIBIOTIC BEADS AND APPLICATION OF WOUND VAC;  Surgeon: Margherita Shell, MD;  Location: [facility] OR;  Service: Vascular;  Laterality: Right;   BACK SURGERY     BACTERIAL OVERGROWTH TEST [name] 05/24/2015   Procedure: BACTERIAL OVERGROWTH TEST;  Surgeon: Alyce Jubilee, MD;  [facility]: [facility];  Service: Endoscopy;  Laterality: [name];  0700   BI-VENTRICULAR IMPLANTABLE CARDIOVERTER DEFIBRILLATOR  (CRT-D)  08/19/2015   BIV ICD GENERATOR CHANGEOUT [name] 11/11/2023   Procedure: BIV ICD GENERATOR CHANGEOUT;  Surgeon: Tammie Fall, MD;  Location: [facility] INVASIVE CV LAB;  Service: Cardiovascular;  Laterality: [name];   CARPAL TUNNEL RELEASE Left 02/02/2016   Procedure: LEFT CARPAL TUNNEL RELEASE;  Surgeon: Lyanne Sample, MD;  Location: [facility];  Service: Orthopedics;  Laterality: Left;  ANESTHESIA: IV REGIONAL UPPER ARM   CATARACT EXTRACTION W/PHACO Right 03/08/2015   Procedure: CATARACT EXTRACTION PHACO AND INTRAOCULAR LENS PLACEMENT (IOC);  Surgeon: Albert Huff, MD;  Location: [facility] ORS;  Service: Ophthalmology;  Laterality: Right;  CDE:9.46   CATARACT EXTRACTION W/PHACO Left 03/22/2015   Procedure: CATARACT EXTRACTION PHACO AND INTRAOCULAR LENS PLACEMENT (IOC);  Surgeon: Albert Huff, MD;  Location: [facility];  Service: Ophthalmology;  Laterality: Left;  CDE:5.80   COLONOSCOPY  08/22/09   Fields-(Tubular Adenoma)3-mm transverse polyp/4-mm polyp otherwise noraml/small internal hemorrhoids   COLONOSCOPY [name] 04/14/2014   ZOX:WRUEA internal hemorrhids/normal mocsa in the terminal iluem/left colonis redundant   COLONOSCOPY W/ POLYPECTOMY  2011   ENDARTERECTOMY FEMORAL Left 08/16/2017   Procedure: ENDARTERECTOMY LEFT PROFUNDA FEMORAL;  Surgeon: Margherita Shell, MD;  Location: [facility] OR;  Service: Vascular;  Laterality: Left;   ENDARTERECTOMY FEMORAL Right 03/31/2020   Procedure: RIGHT FEMORAL ENDARTERECTOMY;  Surgeon: Margherita Shell, MD;  Location: [facility] OR;  Service: Vascular;  Laterality: Right;   EP IMPLANTABLE  DEVICE [name] 08/19/2015   Procedure: BiV ICD Insertion CRT-D;  Surgeon: Tammie Fall, MD;  Location: [facility] INVASIVE CV LAB;  Service: Cardiovascular;  Laterality: [name];   ESOPHAGOGASTRODUODENOSCOPY [name] 03/05/2014   SLF: 1. Stricture at the gastroesophagael junction 2. Small hiatal hernia 3. Moderate non-erosive gastritis and duodentitis. 4. No surce for Melena identified.    FEMORAL-POPLITEAL BYPASS GRAFT Left 08/16/2017   Procedure: LEFT  FEMORAL-POPLITEAL ARTERY  BYPASS GRAFT;  Surgeon: Margherita Shell, MD;  Location[facility] OR;  Service: Vascular;  Laterality: Left;   GIVENS CAPSULE STUDY [name] 03/30/2014   Procedure: GIVENS CAPSULE STUDY;  Surgeon: Alyce Jubilee, MD;  [facility];  Service: Endoscopy;  Laterality: [name];  7:30   GROIN DEBRIDEMENT Right 04/07/2020   Procedure: INCISION AND DRAINAGE OF RIGHT GROIN;  Surgeon: Margherita Shell, MD;  [facility];  Service: Vascular;  Laterality: Right;   I & D EXTREMITY Left 09/03/2017   Procedure: IRRIGATION AND DEBRIDEMENT EXTREMITY LEFT THIGH SEROMA;  Surgeon: Arvil Lauber, MD;  Location: [facility] OR;  Service: Vascular;  Laterality: Left;   INSERT / REPLACE / REMOVE PACEMAKER     INSERTION OF ILIAC STENT  03/31/2020   Procedure: INSERTION OF RIGHT ILIAC ARTERY STENT AND RIGHT SUPERFICIAL FEMORAL ARTERY STENT;  Surgeon: Margherita Shell, MD;  Location: [facility];  Service: Vascular;;   IR ANGIO INTRA EXTRACRAN SEL COM CAROTID INNOMINATE BILAT MOD SED  07/16/2017   IR ANGIO INTRA EXTRACRAN SEL COM CAROTID INNOMINATE BILAT MOD SED  06/12/2019   IR ANGIO INTRA EXTRACRAN SEL COM CAROTID INNOMINATE BILAT MOD SED  01/17/2021   IR ANGIO VERTEBRAL SEL SUBCLAVIAN INNOMINATE BILAT MOD SED  07/16/2017   IR ANGIO VERTEBRAL SEL SUBCLAVIAN INNOMINATE BILAT MOD SED  06/12/2019   IR ANGIO VERTEBRAL SEL SUBCLAVIAN INNOMINATE BILAT MOD SED  01/17/2021   IR RADIOLOGIST EVAL & MGMT  04/01/2017   IR RADIOLOGIST EVAL & MGMT  08/02/2017   IR TRANSCATH EXCRAN VERT OR CAR A STENT   07/22/2019   IR US  GUIDE VASC ACCESS RIGHT  06/12/2019   IR US  GUIDE VASC ACCESS RIGHT  01/17/2021   JOINT REPLACEMENT Right    Total Shoulder Replacement    LOWER EXTREMITY ANGIOGRAPHY Bilateral 06/25/2017   Procedure: Lower Extremity Angiography;  Surgeon: Margherita Shell, MD;  [facility];  Service: Cardiovascular;  Laterality: Bilateral;   LUMBAR FUSION  2010  PATCH ANGIOPLASTY Right 03/31/2020   Procedure: PATCH ANGIOPLASTY USING XENOSURE BIOLOGIC PATCH 1cm x 14cm;  Surgeon: Margherita Shell, MD;  Location: [facility] OR;  Service: Vascular;  Laterality: Right;   PERIPHERAL VASCULAR ATHERECTOMY Right 04/22/2018   Procedure: PERIPHERAL VASCULAR ATHERECTOMY;  Surgeon: Margherita Shell, MD;  [facility];  Service: Cardiovascular;  Laterality: Right;  right superficial femoral   PERIPHERAL VASCULAR BALLOON ANGIOPLASTY Right 04/22/2018   Procedure: PERIPHERAL VASCULAR BALLOON ANGIOPLASTY;  Surgeon: Margherita Shell, MD;  [facility];  Service: Cardiovascular;  Laterality: Right;  external iliac   PERIPHERAL VASCULAR BALLOON ANGIOPLASTY Left 03/08/2020   Procedure: PERIPHERAL VASCULAR BALLOON ANGIOPLASTY;  Surgeon: Margherita Shell, MD;  [facility];  Service: Cardiovascular;  Laterality: Left;  Common Femoral   PERIPHERAL VASCULAR BALLOON ANGIOPLASTY Left 08/23/2020   Procedure: PERIPHERAL VASCULAR BALLOON ANGIOPLASTY;  Surgeon: Margherita Shell, MD;  [facility];  Service: Cardiovascular;  Laterality: Left;  common femoral   PERIPHERAL VASCULAR BALLOON ANGIOPLASTY Left 10/31/2021   Procedure: PERIPHERAL VASCULAR BALLOON ANGIOPLASTY;  Surgeon: Margherita Shell, MD;  [facility];  Service: Cardiovascular;  Laterality: Left;  external iliac   PERIPHERAL VASCULAR INTERVENTION Right 03/08/2020   Procedure: PERIPHERAL VASCULAR INTERVENTION;  Surgeon: Margherita Shell, MD;  [facility];  Service:  Cardiovascular;  Laterality: Right;  SFA   PERIPHERAL VASCULAR INTERVENTION Right 08/23/2020   Procedure: PERIPHERAL VASCULAR INTERVENTION;  Surgeon: Margherita Shell, MD;  [facility];  Service: Cardiovascular;  Laterality: Right;  external iliac   PERIPHERAL VASCULAR INTERVENTION Right 10/31/2021   Procedure: PERIPHERAL VASCULAR INTERVENTION;  Surgeon: Margherita Shell, MD;  [facility];  Service: Cardiovascular;  Laterality: Right;  superficial femoral artery   PERIPHERAL VASCULAR INTERVENTION Left 12/19/2021   Procedure: PERIPHERAL VASCULAR INTERVENTION;  Surgeon: Margherita Shell, MD;  [facility];  Service: Cardiovascular;  Laterality: Left;  stent lt ext iliac / pta common femoral   PERIPHERAL VASCULAR INTERVENTION  02/19/2023   Procedure: PERIPHERAL VASCULAR INTERVENTION;  Surgeon: Margherita Shell, MD;  [facility];  Service: Cardiovascular;;   RADIOLOGY WITH ANESTHESIA [name] 07/01/2019   Procedure: STENTING;  Surgeon: Luellen Sages, MD;  Location[facility];  Service: Radiology;  Laterality: [name];   RADIOLOGY WITH ANESTHESIA [name] 07/22/2019   Procedure: STENTING;  Surgeon: Luellen Sages, MD;  Location: [facility];  Service: Radiology;  Laterality: [name];   TOTAL SHOULDER ARTHROPLASTY Right 2011   Dr. Norris Bee NERVE TRANSPOSITION Left 02/02/2016   Procedure: LEFT IN-SITU DECOMPRESSION ULNAR NERVE ;  Surgeon: Lyanne Sample, MD;  Location: [facility];  Service: Orthopedics;  Laterality: Left;   ULNAR TUNNEL RELEASE Left 02/02/2016   Procedure: LEFT CUBITAL TUNNEL RELEASE;  Surgeon: Lyanne Sample, MD;  Location: [facility];  Service: Orthopedics;  Laterality: Left;   VASECTOMY  1971   VEIN HARVEST Left 08/16/2017   Procedure: USING NON REVERSE LEFT GREATER SAPHENOUS VEIN HARVEST;  Surgeon: Margherita Shell, MD;  Location: [facility];  Service: Vascular;  Laterality: Left;     Family History  Problem Relation  Age of Onset   Coronary artery disease Mother    Diabetes Mother    Heart disease Mother        Before age 63 - 5 Bypasses   Hypertension Mother    Heart attack Mother  3-4 Heart attacks   Alzheimer's disease Father    Diabetes Father    Heart attack Sister    Cancer Brother        Prostate   Hyperlipidemia Son    Prostate cancer Brother    Alzheimer's disease Sister    Colon cancer Neg Hx      Social History   Socioeconomic History   Marital status: Married    Spouse name: Sarvesh Burdge   Number of children: 2   Years of education: 12   Highest education level: 12th grade  Occupational History   Occupation: retired, Personnel officer    Comment: Architect: RETIRED  Tobacco Use   Smoking status: Every Day    Current packs/day: 1.00    Average packs/day: 1 pack/day for 66.6 years (66.6 ttl pk-yrs)    Types: Cigarettes    Start date: 08/20/1957    Passive exposure: Current   Smokeless tobacco: Never  Vaping Use   Vaping status: Former   Start date: 09/10/2016   Quit date: 10/11/2016  Substance and Sexual Activity   Alcohol use: No    Alcohol/week: 0.0 standard drinks of alcohol    Comment: quit in July 2015   Drug use: No   Sexual activity: Not Currently  Other Topics Concern   Not on file  Social History Narrative   Accompanied by daughter Garnet Just   Lives w/ wife, daughter   Social Drivers of Health   Financial Resource Strain: Low Risk  (06/21/2022)   Overall Financial Resource Strain (CARDIA)    Difficulty of Paying Living Expenses: Not hard at all  Food Insecurity: No Food Insecurity (06/21/2022)   Hunger Vital Sign    Worried About Running Out of Food in the Last Year: Never true    Ran Out of Food in the Last Year: Never true  Transportation Needs: No Transportation Needs (06/21/2022)   PRAPARE - Administrator, Civil Service (Medical): No    Lack of Transportation (Non-Medical): No  Physical Activity: Inactive  (06/21/2022)   Exercise Vital Sign    Days of Exercise per Week: 0 days    Minutes of Exercise per Session: 0 min  Stress: No Stress Concern Present (06/21/2022)   [employer] of Occupational Health - Occupational Stress Questionnaire    Feeling of Stress : Not at all  Social Connections: Moderately Integrated (06/21/2022)   Social Connection and Isolation Panel [NHANES]    Frequency of Communication with Friends and Family: More than three times a week    Frequency of Social Gatherings with Friends and Family: More than three times a week    Attends Religious Services: More than 4 times per year    Active Member of [employer] or Organizations: No    Attends Banker Meetings: Never    Marital Status: Married  Catering manager Violence: Not At Risk (06/21/2022)   Humiliation, Afraid, Rape, and Kick questionnaire    Fear of Current or Ex-Partner: No    Emotionally Abused: No    Physically Abused: No    Sexually Abused: No     BP (!) 124/58   Pulse 60   Ht 5\' 3"  (1.6 m)   Wt 146 lb (66.2 kg)   SpO2 96%   BMI 25.86 kg/m   Physical Exam:  Well appearing NAD HEENT: Unremarkable Neck:  No JVD, no thyromegally Lymphatics:  No adenopathy Back:  No CVA tenderness; he has 2 ticks on his  back. Lungs:  Clear with no wheezes HEART:  Regular rate rhythm, no murmurs, no rubs, no clicks Abd:  soft, positive bowel sounds, no organomegally, no rebound, no guarding Ext:  2 plus pulses, no edema, no cyanosis, no clubbing Skin:  No rashes no nodules Neuro:  CN II through XII intact, motor grossly intact  EKG - nsr with biv pacing  DEVICE  Normal device function.  See PaceArt for details.   Assess/Plan: Tick bite - I have removed 2 ticks from the patient's back today with tweezers. I instructed him to wash his skin well with hot soapy water  and I have given him a prescription for doxycycline for 7 days. Chronic systolic heart failure - his symptoms are class 2. No change in  GDMT. ICD -his St. Jude device is working normally. The noise on his ICD lead is minimal. If it were to worsen, I would turn him from an ICD to a biv ppm as his LV function normalized with biv pacing. Tobacco abuse -he is instructed to stop smoking.   Pete Brand Darbie Biancardi,MD

## 2024-04-10 ENCOUNTER — Other Ambulatory Visit: Payer: Self-pay | Admitting: *Deleted

## 2024-04-10 DIAGNOSIS — I739 Peripheral vascular disease, unspecified: Secondary | ICD-10-CM

## 2024-04-11 ENCOUNTER — Ambulatory Visit (HOSPITAL_COMMUNITY)
Admission: RE | Admit: 2024-04-11 | Discharge: 2024-04-11 | Disposition: A | Discharge status: Home / Self Care | Source: Ambulatory Visit | Attending: Internal Medicine | Admitting: Internal Medicine

## 2024-04-11 DIAGNOSIS — F17210 Nicotine dependence, cigarettes, uncomplicated: Secondary | ICD-10-CM | POA: Insufficient documentation

## 2024-04-20 NOTE — Progress Notes (Unsigned)
 Office Note     CC:  follow up Requesting Provider:  Omie Bickers, MD  HPI: Roy [name] is a 78 y.o. (1946-01-17) male who presents for surveillance of PAD.  He is well-known to [facility] with numerous endovascular and open surgeries of bilateral lower extremities.  Most recently he underwent right SFA stenting in March 2024 by Dr. Charlotte Cookey.  Prior procedures include:   1) R ICA stent 2009 2) bilateral external iliac artery stenting in 2018  3) left femoral to above-knee popliteal artery bypass graft with GSV in 2018  4) incision and drainage of distal incision seroma in 2018 5) right SFA atherectomy and drug-coated balloon angioplasty, right external iliac angioplasty in 2019  6) right SFA stenting and left common femoral drug-coated balloon angioplasty in 2021 7) right external iliac and femoral artery endarterectomy, right common iliac stenting, right SFA stenting in 2021 8) right external iliac artery stenting, left common femoral artery drug-coated balloon angioplasty in 2021  9) right SFA stenting and left external iliac artery angioplasty in 2022  10) left external iliac artery stenting, left common femoral artery drug-coated balloon angioplasty in 2023   He was last seen in the office 3 months ago by myself.  He experiences bilateral hip claudication when walking uphill however can walk flat ground indefinitely without symptoms.  He also denies rest pain or tissue loss of bilateral lower extremities.  He does have severe back pain related to lumbar pathology with history of lumbar spine surgery.  He is on aspirin , statin, Brilinta  daily.  He continues to smoke a pack and a half a day.    Past Medical History:  Diagnosis Date   AICD (automatic cardioverter/defibrillator) present 08/19/2015   St Jude BiV ICD for primary prevention by Dr. Carolynne Citron   Alcohol abuse    6 beers per day; hospital admission in 2009 for withdrawal symptoms   Alcoholic cirrhosis (HCC)    Anxiety and depression     denies    Cerebrovascular disease 2009   TIA; 2009- right ICA stent; re-intervention for restenosis complicated by [facility] w/o sx   CHF (congestive heart failure) (HCC) 06-02-14   Chronic obstructive pulmonary disease (HCC)    Degenerative joint disease    Total shoulder arthroplasty-right   Hyperlipidemia    Hypertension    LBBB (left bundle branch block)    Normal echo-2011; stress nuclear in 09/2010--septal hypoperfusion representing nontransmural infarction or the effect of left bundle branch block, no ischemia   Myocardial infarction [facility]) June 02, 2014   Massive Heart Attack   Peripheral vascular disease (HCC)    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   Thrombocytopenia (HCC)    Tobacco abuse    -100 pack years; 1.5 packs per day   Traumatic seroma of thigh (HCC)    left   Tubular adenoma of colon     Past Surgical History:  Procedure Laterality Date   ABDOMINAL AORTAGRAM [name] 05/04/2014   Procedure: ABDOMINAL Tommi Fraise;  Surgeon: Margherita Shell, MD;  Location: [facility] CATH LAB;  Service: Cardiovascular;  Laterality: [name];   ABDOMINAL AORTOGRAM [name] 06/25/2017   Procedure: Abdominal Aortogram;  Surgeon: Margherita Shell, MD;  [facility]: [facility] INVASIVE CV LAB;  Service: Cardiovascular;  Laterality: [name];   ABDOMINAL AORTOGRAM W/LOWER EXTREMITY [name] 04/22/2018   Procedure: ABDOMINAL AORTOGRAM W/LOWER EXTREMITY;  Surgeon: Margherita Shell, MD;  [facility]: [facility] INVASIVE CV LAB;  Service: Cardiovascular;  Laterality: [name];   ABDOMINAL AORTOGRAM W/LOWER EXTREMITY [name] 03/08/2020  Procedure: ABDOMINAL AORTOGRAM W/LOWER EXTREMITY;  Surgeon: Margherita Shell, MD;  [facility] INVAS[facility] CV LAB;  Service: Cardiovascular;  Laterality: [name];   ABDOMINAL AORTOGRAM W/LOWER EXTREMITY Bilateral 08/23/2020   Procedure: ABDOMINAL AORTOGRAM W/LOWER EXTREMITY;  Surgeon: Margherita Shell, MD;  [facility]: [facility] INVAS[facility] CV LAB;  Service: Cardiovascular;  Laterality: Bilateral;   ABDOMINAL AORTOGRAM W/LOWER EXTREMITY  [name] 10/31/2021   Procedure: ABDOMINAL AORTOGRAM W/LOWER EXTREMITY;  Surgeon: Margherita Shell, MD;  [facility] LAB;  Service: Cardiovascular;  Laterality: [name];   ABDOMINAL AORTOGRAM W/LOWER EXTREMITY [name] 12/19/2021   Procedure: ABDOMINAL AORTOGRAM W/LOWER EXTREMITY;  Surgeon: Margherita Shell, MD;  [facility];  Service: Cardiovascular;  Laterality: [name];   ABDOMINAL AORTOGRAM W/LOWER EXTREMITY Bilateral 02/19/2023   Procedure: ABDOMINAL AORTOGRAM W/LOWER EXTREMITY;  Surgeon: Margherita Shell, MD;  [facility];  Service: Cardiovascular;  Laterality: Bilateral;   AGILE CAPSULE [name] 03/18/2014   Procedure: AGILE CAPSULE;  Surgeon: Alyce Jubilee, MD;  [facility]: [facility];  Service: Endoscopy;  Laterality: [name];  7:30   APPLICATION OF WOUND VAC Left 09/03/2017   Procedure: APPLICATION OF WOUND VAC;  Surgeon: Arvil Lauber, MD;  Location: [facility] OR;  Service: Vascular;  Laterality: Left;   APPLICATION OF WOUND VAC Right 04/07/2020   Procedure: PLACEMENT OF ANTIBIOTIC BEADS AND APPLICATION OF WOUND VAC;  Surgeon: Margherita Shell, MD;  [facility] OR;  Service: Vascular;  Laterality: Right;   BACK SURGERY     BACTERIAL OVERGROWTH TEST [name] 05/24/2015   Procedure: BACTERIAL OVERGROWTH TEST;  Surgeon: Alyce Jubilee, MD;  [facility];  Service: Endoscopy;  Laterality: [name];  0700   BI-VENTRICULAR IMPLANTABLE CARDIOVERTER DEFIBRILLATOR  (CRT-D)  08/19/2015   BIV ICD GENERATOR CHANGEOUT [name] 11/11/2023   Procedure: BIV ICD GENERATOR CHANGEOUT;  Surgeon: Tammie Fall, MD;  Location: [facility] INVASIVE CV LAB;  Service: Cardiovascular;  Laterality: [name];   CARPAL TUNNEL RELEASE Left 02/02/2016   Procedure: LEFT CARPAL TUNNEL RELEASE;  Surgeon: Lyanne Sample, MD;  Location: [facility];  Service: Orthopedics;  Laterality: Left;  ANESTHESIA: IV REGIONAL UPPER ARM   CATARACT EXTRACTION W/PHACO Right 03/08/2015   Procedure: CATARACT EXTRACTION PHACO AND  INTRAOCULAR LENS PLACEMENT (IOC);  Surgeon: Albert Huff, MD;  Location: [facility]S;  Service: Ophthalmology;  Laterality: Right;  CDE:9.46   CATARACT EXTRACTION W/PHACO Left 03/22/2015   Procedure: CATARACT EXTRACTION PHACO AND INTRAOCULAR LENS PLACEMENT (IOC);  Surgeon: Albert Huff, MD;  Location: [facility];  Service: Ophthalmology;  Laterality: Left;  CDE:5.80   COLONOSCOPY  08/22/09   Fields-(Tubular Adenoma)3-mm transverse polyp/4-mm polyp otherwise noraml/small internal hemorrhoids   COLONOSCOPY [name] 04/14/2014   QMV:HQION internal hemorrhids/normal mocsa in the terminal iluem/left colonis redundant   COLONOSCOPY W/ POLYPECTOMY  2011   ENDARTERECTOMY FEMORAL Left 08/16/2017   Procedure: ENDARTERECTOMY LEFT PROFUNDA FEMORAL;  Surgeon: Margherita Shell, MD;  Location: [facility] OR;  Service: Vascular;  Laterality: Left;   ENDARTERECTOMY FEMORAL Right 03/31/2020   Procedure: RIGHT FEMORAL ENDARTERECTOMY;  Surgeon: Margherita Shell, MD;  [facility] OR;  Service: Vascular;  Laterality: Right;   EP IMPLANTABLE DEVICE [name] 08/19/2015   Procedure: BiV ICD Insertion CRT-D;  Surgeon: Tammie Fall, MD;  Location: [facility] INVASIVE CV LAB;  Service: Cardiovascular;  Laterality: [name];   ESOPHAGOGASTRODUODENOSCOPY [name] 03/05/2014   SLF: 1. Stricture at the gastroesophagael junction 2. Small hiatal hernia 3. Moderate non-erosive gastritis and duodentitis. 4. No surce for Melena  identified.    FEMORAL-POPLITEAL BYPASS GRAFT Left 08/16/2017   Procedure: LEFT  FEMORAL-POPLITEAL ARTERY  BYPASS GRAFT;  Surgeon: Margherita Shell, MD;  [facility];  Service: Vascular;  Laterality: Left;   GIVENS CAPSULE STUDY [name] 03/30/2014   Procedure: GIVENS CAPSULE STUDY;  Surgeon: Alyce Jubilee, MD;  [facility];  Service: Endoscopy;  Laterality: [name];  7:30   GROIN DEBRIDEMENT Right 04/07/2020   Procedure: INCISION AND DRAINAGE OF RIGHT GROIN;  Surgeon: Margherita Shell, MD;  [facility];  Service: Vascular;  Laterality: Right;   I  & D EXTREMITY Left 09/03/2017   Procedure: IRRIGATION AND DEBRIDEMENT EXTREMITY LEFT THIGH SEROMA;  Surgeon: Arvil Lauber, MD;  [facility]: [facility];  Service: Vascular;  Laterality: Left;   INSERT / REPLACE / REMOVE PACEMAKER     INSERTION OF ILIAC STENT  03/31/2020   Procedure: INSERTION OF RIGHT ILIAC ARTERY STENT AND RIGHT SUPERFICIAL FEMORAL ARTERY STENT;  Surgeon: Margherita Shell, MD;  [facility];  Service: Vascular;;   IR ANGIO INTRA EXTRACRAN SEL COM CAROTID INNOMINATE BILAT MOD SED  07/16/2017   IR ANGIO INTRA EXTRACRAN SEL COM CAROTID INNOMINATE BILAT MOD SED  06/12/2019   IR ANGIO INTRA EXTRACRAN SEL COM CAROTID INNOMINATE BILAT MOD SED  01/17/2021   IR ANGIO VERTEBRAL SEL SUBCLAVIAN INNOMINATE BILAT MOD SED  07/16/2017   IR ANGIO VERTEBRAL SEL SUBCLAVIAN INNOMINATE BILAT MOD SED  06/12/2019   IR ANGIO VERTEBRAL SEL SUBCLAVIAN INNOMINATE BILAT MOD SED  01/17/2021   IR RADIOLOGIST EVAL & MGMT  04/01/2017   IR RADIOLOGIST EVAL & MGMT  08/02/2017   IR TRANSCATH EXCRAN VERT OR CAR A STENT  07/22/2019   IR US  GUIDE VASC ACCESS RIGHT  06/12/2019   IR US  GUIDE VASC ACCESS RIGHT  01/17/2021   JOINT REPLACEMENT Right    Total Shoulder Replacement    LOWER EXTREMITY ANGIOGRAPHY Bilateral 06/25/2017   Procedure: Lower Extremity Angiography;  Surgeon: Margherita Shell, MD;  [facility];  Service: Cardiovascular;  Laterality: Bilateral;   LUMBAR FUSION  2010   PATCH ANGIOPLASTY Right 03/31/2020   Procedure: PATCH ANGIOPLASTY USING XENOSURE BIOLOGIC PATCH 1cm x 14cm;  Surgeon: Margherita Shell, MD;  Location: [facility] OR;  Service: Vascular;  Laterality: Right;   PERIPHERAL VASCULAR ATHERECTOMY Right 04/22/2018   Procedure: PERIPHERAL VASCULAR ATHERECTOMY;  Surgeon: Margherita Shell, MD;  [facility];  Service: Cardiovascular;  Laterality: Right;  right superficial femoral   PERIPHERAL VASCULAR BALLOON ANGIOPLASTY Right 04/22/2018   Procedure: PERIPHERAL VASCULAR BALLOON ANGIOPLASTY;   Surgeon: Margherita Shell, MD;  [facility];  Service: Cardiovascular;  Laterality: Right;  external iliac   PERIPHERAL VASCULAR BALLOON ANGIOPLASTY Left 03/08/2020   Procedure: PERIPHERAL VASCULAR BALLOON ANGIOPLASTY;  Surgeon: Margherita Shell, MD;  [facility];  Service: Cardiovascular;  Laterality: Left;  Common Femoral   PERIPHERAL VASCULAR BALLOON ANGIOPLASTY Left 08/23/2020   Procedure: PERIPHERAL VASCULAR BALLOON ANGIOPLASTY;  Surgeon: Margherita Shell, MD;  [facility];  Service: Cardiovascular;  Laterality: Left;  common femoral   PERIPHERAL VASCULAR BALLOON ANGIOPLASTY Left 10/31/2021   Procedure: PERIPHERAL VASCULAR BALLOON ANGIOPLASTY;  Surgeon: Margherita Shell, MD;  [facility];  Service: Cardiovascular;  Laterality: Left;  external iliac   PERIPHERAL VASCULAR INTERVENTION Right 03/08/2020   Procedure: PERIPHERAL VASCULAR INTERVENTION;  Surgeon: Margherita Shell, MD;  [facility] CV  [facility];  Service: Cardiovascular;  Laterality: Right;  SFA   PERIPHERAL VASCULAR INTERVENTION Right 08/23/2020   Procedure: PERIPHERAL VASCULAR INTERVENTION;  Surgeon: Margherita Shell, MD;  [facility];  Service: Cardiovascular;  Laterality: Right;  external iliac   PERIPHERAL VASCULAR INTERVENTION Right 10/31/2021   Procedure: PERIPHERAL VASCULAR INTERVENTION;  Surgeon: Margherita Shell, MD;  [facility];  Service: Cardiovascular;  Laterality: Right;  superficial femoral artery   PERIPHERAL VASCULAR INTERVENTION Left 12/19/2021   Procedure: PERIPHERAL VASCULAR INTERVENTION;  Surgeon: Margherita Shell, MD;  [facility];  Service: Cardiovascular;  Laterality: Left;  stent lt ext iliac / pta common femoral   PERIPHERAL VASCULAR INTERVENTION  02/19/2023   Procedure: PERIPHERAL VASCULAR INTERVENTION;  Surgeon: Margherita Shell, MD;  [facility];  Service: Cardiovascular;;    RADIOLOGY WITH ANESTHESIA [name] 07/01/2019   Procedure: STENTING;  Surgeon: Luellen Sages, MD;  [facility] OR;  Service: Radiology;  Laterality: [name];   RADIOLOGY WITH ANESTHESIA [name] 07/22/2019   Procedure: STENTING;  Surgeon: Luellen Sages, MD;  [facility] OR;  Service: Radiology;  Laterality: [name];   TOTAL SHOULDER ARTHROPLASTY Right 2011   Dr. Norris Bee NERVE TRANSPOSITION Left 02/02/2016   Procedure: LEFT IN-SITU DECOMPRESSION ULNAR NERVE ;  Surgeon: Lyanne Sample, MD;  Location: [facility];  Service: Orthopedics;  Laterality: Left;   ULNAR TUNNEL RELEASE Left 02/02/2016   Procedure: LEFT CUBITAL TUNNEL RELEASE;  Surgeon: Lyanne Sample, MD;  Location: [facility];  Service: Orthopedics;  Laterality: Left;   VASECTOMY  1971   VEIN HARVEST Left 08/16/2017   Procedure: USING NON REVERSE LEFT GREATER SAPHENOUS VEIN HARVEST;  Surgeon: Margherita Shell, MD;  [facility];  Service: Vascular;  Laterality: Left;    Social History   Socioeconomic History   Marital status: Married    Spouse name: Jensyn Cambria   Number of children: 2   Years of education: 12   Highest education level: 12th grade  Occupational History   Occupation: retired, Personnel officer    Comment: Architect: RETIRED  Tobacco Use   Smoking status: Every Day    Current packs/day: 1.00    Average packs/day: 1 pack/day for 66.7 years (66.7 ttl pk-yrs)    Types: Cigarettes    Start date: 08/20/1957    Passive exposure: Current   Smokeless tobacco: Never  Vaping Use   Vaping status: Former   Start date: 09/10/2016   Quit date: 10/11/2016  Substance and Sexual Activity   Alcohol use: No    Alcohol/week: 0.0 standard drinks of alcohol    Comment: quit in July 2015   Drug use: No   Sexual activity: Not Currently  Other Topics Concern   Not on file  Social History Narrative   Accompanied by daughter Garnet Just   Lives w/ wife, daughter   Social Drivers of Health    Financial Resource Strain: Low Risk  (06/21/2022)   Overall Financial Resource Strain (CARDIA)    Difficulty of Paying Living Expenses: Not hard at all  Food Insecurity: No Food Insecurity (06/21/2022)   Hunger Vital Sign    Worried About Running Out of Food in the Last Year: Never true    Ran Out of Food in the Last Year: Never true  Transportation Needs: No Transportation Needs (06/21/2022)   PRAPARE - Administrator, Civil Service (Medical): No  Lack of Transportation (Non-Medical): No  Physical Activity: Inactive (06/21/2022)   Exercise Vital Sign    Days of Exercise per Week: 0 days    Minutes of Exercise per Session: 0 min  Stress: No Stress Concern Present (06/21/2022)   [employer] of Occupational Health - Occupational Stress Questionnaire    Feeling of Stress : Not at all  Social Connections: Moderately Integrated (06/21/2022)   Social Connection and Isolation Panel [NHANES]    Frequency of Communication with Friends and Family: More than three times a week    Frequency of Social Gatherings with Friends and Family: More than three times a week    Attends Religious Services: More than 4 times per year    Active Member of [employer] or Organizations: No    Attends Banker Meetings: Never    Marital Status: Married  Catering manager Violence: Not At Risk (06/21/2022)   Humiliation, Afraid, Rape, and Kick questionnaire    Fear of Current or Ex-Partner: No    Emotionally Abused: No    Physically Abused: No    Sexually Abused: No    Family History  Problem Relation Age of Onset   Coronary artery disease Mother    Diabetes Mother    Heart disease Mother        Before age 27 - 5 Bypasses   Hypertension Mother    Heart attack Mother        3-4 Heart attacks   Alzheimer's disease Father    Diabetes Father    Heart attack Sister    Cancer Brother        Prostate   Hyperlipidemia Son    Prostate cancer Brother    Alzheimer's disease Sister     Colon cancer Neg Hx     Current Outpatient Medications  Medication Sig Dispense Refill   aspirin  EC 81 MG tablet Take 81 mg by mouth every evening.      atorvastatin  (LIPITOR ) 20 MG tablet Take 20 mg by mouth every evening.     BRILINTA  90 MG TABS tablet Take 90 mg by mouth 2 (two) times daily.     carvedilol  (COREG ) 6.25 MG tablet TAKE 1 TABLET TWICE A DAY WITH MEALS (DOSE CHANGE, NEW PRESCRIPTION) 180 tablet 1   cetirizine (ZYRTEC) 10 MG tablet Take 10 mg by mouth daily.     Cobalamin Combinations (NEURIVA PLUS) CAPS Take 1 capsule by mouth daily.     dextromethorphan -guaiFENesin  (MUCINEX  DM) 30-600 MG 12hr tablet Take 1 tablet by mouth 2 (two) times daily.     doxycycline  (VIBRAMYCIN ) 100 MG capsule Take 1 capsule (100 mg total) by mouth 2 (two) times daily. For 7 days 14 capsule 0   gabapentin  (NEURONTIN ) 300 MG capsule Take 300 mg by mouth 2 (two) times daily.      HYDROcodone -acetaminophen  (NORCO/VICODIN) 5-325 MG tablet Take 1-2 tablets by mouth See admin instructions. Take 2 tablets in the morning and evening, may take 1 tablet at lunch as needed for pain     Magnesium  250 MG TABS Take 250 mg by mouth every evening.     Multiple Vitamins-Minerals (CENTRUM SILVER PO) Take 1 tablet by mouth daily. 50+     Naphazoline HCl (CLEAR EYES OP) Place 1 drop into both eyes daily as needed (dry eyes).     Omega-3 Fatty Acids (FISH OIL ) 1000 MG CAPS Take 1,000 mg by mouth every evening.     sacubitril -valsartan  (ENTRESTO ) 97-103 MG TAKE 1 TABLET TWICE A DAY (  OVERDUE FOR APPOINTMENT) 180 tablet 1   Salicylic Acid (GOLD BOND PSORIASIS RELIEF) 3 % CREA Apply 1 application topically daily as needed (psoriasis).     silodosin (RAPAFLO) 8 MG CAPS capsule Take 8 mg by mouth daily with breakfast.     SUPER B COMPLEX/C PO Take 1 tablet by mouth every evening.     tamsulosin  (FLOMAX ) 0.4 MG CAPS capsule Take 0.4 mg by mouth daily after supper.     traMADol (ULTRAM) 50 MG tablet Take 50 mg by mouth 2 (two)  times daily as needed for moderate pain (pain score 4-6).     No current facility-administered medications for this visit.    No Known Allergies   REVIEW OF SYSTEMS:   [X]  denotes positive finding, [ ]  denotes negative finding Cardiac  Comments:  Chest pain or chest pressure:    Shortness of breath upon exertion:    Short of breath when lying flat:    Irregular heart rhythm:        Vascular    Pain in calf, thigh, or hip brought on by ambulation:    Pain in feet at night that wakes you up from your sleep:     Blood clot in your veins:    Leg swelling:         Pulmonary    Oxygen at home:    Productive cough:     Wheezing:         Neurologic    Sudden weakness in arms or legs:     Sudden numbness in arms or legs:     Sudden onset of difficulty speaking or slurred speech:    Temporary loss of vision in one eye:     Problems with dizziness:         Gastrointestinal    Blood in stool:     Vomited blood:         Genitourinary    Burning when urinating:     Blood in urine:        Psychiatric    Major depression:         Hematologic    Bleeding problems:    Problems with blood clotting too easily:        Skin    Rashes or ulcers:        Constitutional    Fever or chills:      PHYSICAL EXAMINATION:  Vitals:   04/21/24 1259  BP: (!) 146/68  Pulse: 64  SpO2: 100%  Weight: 146 lb (66.2 kg)  Height: 5\' 3"  (1.6 m)    General:  WDWN in NAD; vital signs documented above Gait: Not observed HENT: WNL, normocephalic Pulmonary: normal non-labored breathing Cardiac: regular HR Abdomen: soft, NT, no masses Skin: without rashes Vascular Exam/Pulses: Palpable DP pulses bilaterally Extremities: without ischemic changes, without Gangrene , without cellulitis; without open wounds;  Musculoskeletal: no muscle wasting or atrophy  Neurologic: A&O X 3 Psychiatric:  The pt has Normal affect.   Non-Invasive Vascular Imaging:   Right lower extremity arterial duplex  demonstrates elevated velocity proximal to the SFA stent at 372 cm/s however has multiphasic flow distal to this area  ABI/TBIToday's ABIToday's TBIPrevious ABIPrevious TBI  +-------+-----------+-----------+------------+------------+  Right 0.88       0.69       0.83        0.73          +-------+-----------+-----------+------------+------------+  Left  1.01       0.67  0.83        0.69          +-------+-----------+-----------+------------+------------+    ASSESSMENT/PLAN:: 78 y.o. male here for follow up for surveillance of PAD with numerous endovascular and open vascular surgery bilateral lower extremities  [name] is a 78 year old male who was brought back for short interval surveillance of right lower extremity due to an elevated velocity in his proximal SFA stent.  Looking back at most recent angiography pictures in March 2024 this area appears widely patent.  Duplex performed after angiography demonstrated a greater than 400 cm/s velocity in this area despite no evidence of significant stenosis on angiography.  He continues to have an elevated velocity of 370 cm/s in this area.  He however has no change in ABI or TBI from visit to visit.  He continues to have palpable DP pulses bilaterally.  He only experiences claudication symptoms in his hips when walking uphill however is able to walk flat ground indefinitely.  We will hold off on repeat angiography at this time he also continues to smoke at least a pack and a half a day.  We will check an aortoiliac and bilateral lower extremity arterial duplex with ABI in 6 months.  He will also need to be brought back a separate time for a carotid duplex with history of right ICA stenting.  He will continue his Brilinta , aspirin , and statin daily.  He will notify the office if claudication symptoms worsen or if he develops rest pain or tissue loss in bilateral lower extremities.   Cordie Deters, PA-C [facility] [phone]

## 2024-04-21 ENCOUNTER — Ambulatory Visit (INDEPENDENT_AMBULATORY_CARE_PROVIDER_SITE_OTHER): Admitting: Physician Assistant

## 2024-04-21 ENCOUNTER — Ambulatory Visit

## 2024-04-21 ENCOUNTER — Ambulatory Visit: Payer: Medicare Other

## 2024-04-21 VITALS — BP 146/68 | HR 64 | Ht 63.0 in | Wt 146.0 lb

## 2024-04-21 DIAGNOSIS — I739 Peripheral vascular disease, unspecified: Secondary | ICD-10-CM

## 2024-04-21 DIAGNOSIS — I70213 Atherosclerosis of native arteries of extremities with intermittent claudication, bilateral legs: Secondary | ICD-10-CM | POA: Diagnosis not present

## 2024-04-21 LAB — VAS US ABI WITH/WO TBI
Left ABI: 1.01
Right ABI: 0.88

## 2024-05-12 ENCOUNTER — Ambulatory Visit (INDEPENDENT_AMBULATORY_CARE_PROVIDER_SITE_OTHER): Payer: Medicare Other

## 2024-05-12 DIAGNOSIS — I255 Ischemic cardiomyopathy: Secondary | ICD-10-CM | POA: Diagnosis not present

## 2024-05-12 LAB — CUP PACEART REMOTE DEVICE CHECK
Battery Remaining Longevity: 78 mo
Battery Remaining Percentage: 91 %
Battery Voltage: 3.01 V
Brady Statistic AP VP Percent: 40 %
Brady Statistic AP VS Percent: 1 %
Brady Statistic AS VP Percent: 59 %
Brady Statistic AS VS Percent: 1 %
Brady Statistic RA Percent Paced: 39 %
Date Time Interrogation Session: 20250617020233
HighPow Impedance: 70 Ohm
Implantable Lead Connection Status: 753985
Implantable Lead Connection Status: 753985
Implantable Lead Connection Status: 753985
Implantable Lead Implant Date: 20160923
Implantable Lead Implant Date: 20160923
Implantable Lead Implant Date: 20160923
Implantable Lead Location: 753858
Implantable Lead Location: 753859
Implantable Lead Location: 753860
Implantable Lead Model: 7122
Implantable Pulse Generator Implant Date: 20241216
Lead Channel Impedance Value: 440 Ohm
Lead Channel Impedance Value: 450 Ohm
Lead Channel Impedance Value: 830 Ohm
Lead Channel Pacing Threshold Amplitude: 0.75 V
Lead Channel Pacing Threshold Amplitude: 1 V
Lead Channel Pacing Threshold Amplitude: 1.5 V
Lead Channel Pacing Threshold Pulse Width: 0.5 ms
Lead Channel Pacing Threshold Pulse Width: 0.5 ms
Lead Channel Pacing Threshold Pulse Width: 0.8 ms
Lead Channel Sensing Intrinsic Amplitude: 12 mV
Lead Channel Sensing Intrinsic Amplitude: 3.9 mV
Lead Channel Setting Pacing Amplitude: 2 V
Lead Channel Setting Pacing Amplitude: 2.5 V
Lead Channel Setting Pacing Amplitude: 2.5 V
Lead Channel Setting Pacing Pulse Width: 0.5 ms
Lead Channel Setting Pacing Pulse Width: 0.8 ms
Lead Channel Setting Sensing Sensitivity: 0.6 mV
Pulse Gen Serial Number: 111071805
Zone Setting Status: 755011

## 2024-05-14 ENCOUNTER — Ambulatory Visit: Payer: Self-pay | Admitting: Internal Medicine

## 2024-05-23 ENCOUNTER — Encounter (HOSPITAL_COMMUNITY): Payer: Self-pay | Admitting: Interventional Radiology

## 2024-05-25 ENCOUNTER — Telehealth: Payer: Self-pay | Admitting: Internal Medicine

## 2024-05-25 NOTE — Telephone Encounter (Signed)
 Will forward to our device team for further management.

## 2024-05-25 NOTE — Telephone Encounter (Signed)
 Pt wants to know if he can use the Signal Relief Patch for his pain with his pacemaker. Please advise

## 2024-05-25 NOTE — Telephone Encounter (Signed)
 Returned call to Pt.  Per Pt he plans to apply patches to the small of his back and left/right shoulders.  Advised Per review of information available online (which is minimal) that the patch is negatively charged which is supposed to provide an anti-inflammatory effect at the local level.  Advised Pt appears patch would not interfere with device.  Pt is not dependent on his device.  Advised ok to use patch.  Will continue to monitor remotely.

## 2024-06-03 DIAGNOSIS — R7303 Prediabetes: Secondary | ICD-10-CM | POA: Diagnosis not present

## 2024-06-03 DIAGNOSIS — E782 Mixed hyperlipidemia: Secondary | ICD-10-CM | POA: Diagnosis not present

## 2024-06-04 ENCOUNTER — Encounter (INDEPENDENT_AMBULATORY_CARE_PROVIDER_SITE_OTHER): Payer: Self-pay | Admitting: *Deleted

## 2024-06-06 ENCOUNTER — Other Ambulatory Visit: Payer: Self-pay | Admitting: Cardiology

## 2024-06-09 ENCOUNTER — Encounter: Payer: Self-pay | Admitting: Internal Medicine

## 2024-06-09 DIAGNOSIS — R7303 Prediabetes: Secondary | ICD-10-CM | POA: Diagnosis not present

## 2024-06-09 DIAGNOSIS — E875 Hyperkalemia: Secondary | ICD-10-CM | POA: Diagnosis not present

## 2024-06-09 DIAGNOSIS — D509 Iron deficiency anemia, unspecified: Secondary | ICD-10-CM | POA: Diagnosis not present

## 2024-06-09 DIAGNOSIS — E782 Mixed hyperlipidemia: Secondary | ICD-10-CM | POA: Diagnosis not present

## 2024-06-09 DIAGNOSIS — R918 Other nonspecific abnormal finding of lung field: Secondary | ICD-10-CM | POA: Diagnosis not present

## 2024-06-09 DIAGNOSIS — N401 Enlarged prostate with lower urinary tract symptoms: Secondary | ICD-10-CM | POA: Diagnosis not present

## 2024-06-09 DIAGNOSIS — I77819 Aortic ectasia, unspecified site: Secondary | ICD-10-CM | POA: Diagnosis not present

## 2024-06-09 DIAGNOSIS — I10 Essential (primary) hypertension: Secondary | ICD-10-CM | POA: Diagnosis not present

## 2024-06-09 DIAGNOSIS — I739 Peripheral vascular disease, unspecified: Secondary | ICD-10-CM | POA: Diagnosis not present

## 2024-06-09 DIAGNOSIS — I5022 Chronic systolic (congestive) heart failure: Secondary | ICD-10-CM | POA: Diagnosis not present

## 2024-06-09 DIAGNOSIS — I110 Hypertensive heart disease with heart failure: Secondary | ICD-10-CM | POA: Diagnosis not present

## 2024-06-09 DIAGNOSIS — K7460 Unspecified cirrhosis of liver: Secondary | ICD-10-CM | POA: Diagnosis not present

## 2024-06-13 ENCOUNTER — Other Ambulatory Visit: Payer: Self-pay | Admitting: Cardiology

## 2024-06-16 ENCOUNTER — Encounter: Payer: Self-pay | Admitting: Gastroenterology

## 2024-06-16 NOTE — H&P (View-Only) (Signed)
 Referring Provider: Shona Norleen PEDLAR, MD Primary Care Physician:  Shona Norleen PEDLAR, MD Primary GI Physician: Dr. Shaaron  Chief Complaint  Patient presents with   Colonoscopy    Colonoscopy screening     HPI:   Roy [name] is a 78 y.o. male presenting today with a history of peripheral artery disease, TIA, COPD, MI, chronic systolic heart failure s/p biventricular ICD, HTN, HLD, presenting today to discuss scheduling screening colonoscopy.  Last colonoscopy 04/14/2014 with normal exam.  Recommended 10-year repeat.  Reviewed labs available scanned into the chart dated 06/03/2024.  Potassium slightly elevated at 5.3, but otherwise CMP within normal limits.  CBC within normal limits aside from slight elevation of absolute eosinophils at 0.6.   Today: Reports doing well overall.  No GI concerns.  Denies BRBPR, melena, change in bowel habits, abdominal pain, unintentional weight loss, nausea, vomiting, reflux symptoms, dysphagia.  He is very concerned about stopping Brilinta  due to to his history of peripheral artery disease requiring multiple interventions.  Last stent was placed in 2023.  Past Medical History:  Diagnosis Date   AICD (automatic cardioverter/defibrillator) present 08/19/2015   St Jude BiV ICD for primary prevention by Dr. Waddell   Alcohol abuse    6 beers per day; hospital admission in 2009 for withdrawal symptoms   Anxiety and depression    denies    Cerebrovascular disease 2009   TIA; 2009- right ICA stent; re-intervention for restenosis complicated by [facility] w/o sx   CHF (congestive heart failure) (HCC) 06/02/2014   Chronic obstructive pulmonary disease (HCC)    Degenerative joint disease    Total shoulder arthroplasty-right   Hyperlipidemia    Hypertension    LBBB (left bundle branch block)    Normal echo-2011; stress nuclear in 09/2010--septal hypoperfusion representing nontransmural infarction or the effect of left bundle branch block, no ischemia   Myocardial  infarction (HCC) 06/02/2014   Massive Heart Attack   Peripheral vascular disease (HCC)    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   Thrombocytopenia (HCC)    Tobacco abuse    -100 pack years; 1.5 packs per day   Traumatic seroma of thigh (HCC)    left   Tubular adenoma of colon     Past Surgical History:  Procedure Laterality Date   ABDOMINAL AORTAGRAM [name] 05/04/2014   Procedure: ABDOMINAL EZELLA;  Surgeon: Gaile LELON New, MD;  Location[facility] CATH LAB;  Service: Cardiovascular;  Laterality: [name];   ABDOMINAL AORTOGRAM [name] 06/25/2017   Procedure: Abdominal Aortogram;  Surgeon: New Gaile LELON, MD;  Location: [facility] INVASIVE CV LAB;  Service: Cardiovascular;  Laterality: [name];   ABDOMINAL AORTOGRAM W/LOWER EXTREMITY [name] 04/22/2018   Procedure: ABDOMINAL AORTOGRAM W/LOWER EXTREMITY;  Surgeon: New Gaile LELON, MD;  Location: [facility] INVASIVE CV LAB;  Service: Cardiovascular;  Laterality: [name];   ABDOMINAL AORTOGRAM W/LOWER EXTREMITY [name] 03/08/2020   Procedure: ABDOMINAL AORTOGRAM W/LOWER EXTREMITY;  Surgeon: New Gaile LELON, MD;  [facility]: [facility] INVASIVE CV LAB;  Service: Cardiovascular;  Laterality: [name];   ABDOMINAL AORTOGRAM W/LOWER EXTREMITY Bilateral 08/23/2020   Procedure: ABDOMINAL AORTOGRAM W/LOWER EXTREMITY;  Surgeon: New Gaile LELON, MD;  [facility]: [facility] INVASIVE CV LAB;  Service: Cardiovascular;  Laterality: Bilateral;   ABDOMINAL AORTOGRAM W/LOWER EXTREMITY [name] 10/31/2021   Procedure: ABDOMINAL AORTOGRAM W/LOWER EXTREMITY;  Surgeon: New Gaile LELON, MD;  [facility]: [facility] CV LAB;  Service: Cardiovascular;  Laterality: [name];   ABDOMINAL AORTOGRAM W/LOWER EXTREMITY [name] 12/19/2021   Procedure: ABDOMINAL AORTOGRAM W/LOWER EXTREMITY;  Surgeon: Serene Gaile ORN, MD;  [facility]: [facility] CV LAB;  Service: Cardiovascular;  Laterality: [name];   ABDOMINAL AORTOGRAM W/LOWER EXTREMITY Bilateral 02/19/2023   Procedure: ABDOMINAL AORTOGRAM W/LOWER EXTREMITY;  Surgeon: Serene Gaile ORN, MD;  Location:  [facility]VAS[facility] CV LAB;  Service: Cardiovascular;  Laterality: Bilateral;   AGILE CAPSULE [name] 03/18/2014   Procedure: AGILE CAPSULE;  Surgeon: Margo LITTIE Haddock, MD;  Location: [facility];  Service: Endoscopy;  Laterality: [name];  7:30   APPLICATION OF WOUND VAC Left 09/03/2017   Procedure: APPLICATION OF WOUND VAC;  Surgeon: Laurence Redell LITTIE, MD;  Location: [facility] OR;  Service: Vascular;  Laterality: Left;   APPLICATION OF WOUND VAC Right 04/07/2020   Procedure: PLACEMENT OF ANTIBIOTIC BEADS AND APPLICATION OF WOUND VAC;  Surgeon: Serene Gaile ORN, MD;  Location: [facility] OR;  Service: Vascular;  Laterality: Right;   BACK SURGERY     BACTERIAL OVERGROWTH TEST [name] 05/24/2015   Procedure: BACTERIAL OVERGROWTH TEST;  Surgeon: Margo LITTIE Haddock, MD;  Location: [facility];  Service: Endoscopy;  Laterality: [name];  0700   BI-VENTRICULAR IMPLANTABLE CARDIOVERTER DEFIBRILLATOR  (CRT-D)  08/19/2015   BIV ICD GENERATOR CHANGEOUT [name] 11/11/2023   Procedure: BIV ICD GENERATOR CHANGEOUT;  Surgeon: Waddell Danelle ORN, MD;  Location: [facility] INVASIVE CV LAB;  Service: Cardiovascular;  Laterality: [name];   CARPAL TUNNEL RELEASE Left 02/02/2016   Procedure: LEFT CARPAL TUNNEL RELEASE;  Surgeon: Arley Curia, MD;  Location: [facility];  Service: Orthopedics;  Laterality: Left;  ANESTHESIA: IV REGIONAL UPPER ARM   CATARACT EXTRACTION W/PHACO Right 03/08/2015   Procedure: CATARACT EXTRACTION PHACO AND INTRAOCULAR LENS PLACEMENT (IOC);  Surgeon: Oneil Platts, MD;  Location: [facility] ORS;  Service: Ophthalmology;  Laterality: Right;  CDE:9.46   CATARACT EXTRACTION W/PHACO Left 03/22/2015   Procedure: CATARACT EXTRACTION PHACO AND INTRAOCULAR LENS PLACEMENT (IOC);  Surgeon: Oneil Platts, MD;  Location: [facility] ORS;  Service: Ophthalmology;  Laterality: Left;  CDE:5.80   COLONOSCOPY  08/22/09   Fields-(Tubular Adenoma)3-mm transverse polyp/4-mm polyp otherwise noraml/small internal hemorrhoids   COLONOSCOPY [name] 04/14/2014   DOQ:dfjoo internal  hemorrhids/normal mocsa in the terminal iluem/left colonis redundant   COLONOSCOPY W/ POLYPECTOMY  2011   ENDARTERECTOMY FEMORAL Left 08/16/2017   Procedure: ENDARTERECTOMY LEFT PROFUNDA FEMORAL;  Surgeon: Serene Gaile ORN, MD;  Location: [facility] OR;  Service: Vascular;  Laterality: Left;   ENDARTERECTOMY FEMORAL Right 03/31/2020   Procedure: RIGHT FEMORAL ENDARTERECTOMY;  Surgeon: Serene Gaile ORN, MD;  [facility] OR;  Service: Vascular;  Laterality: Right;   EP IMPLANTABLE DEVICE [name] 08/19/2015   Procedure: BiV ICD Insertion CRT-D;  Surgeon: Danelle ORN Waddell, MD;  Location: [facility] INVASIVE CV LAB;  Service: Cardiovascular;  Laterality: [name];   ESOPHAGOGASTRODUODENOSCOPY [name] 03/05/2014   SLF: 1. Stricture at the gastroesophagael junction 2. Small hiatal hernia 3. Moderate non-erosive gastritis and duodentitis. 4. No surce for Melena identified.    FEMORAL-POPLITEAL BYPASS GRAFT Left 08/16/2017   Procedure: LEFT  FEMORAL-POPLITEAL ARTERY  BYPASS GRAFT;  Surgeon: Serene Gaile ORN, MD;  [facility] OR;  Service: Vascular;  Laterality: Left;   GIVENS CAPSULE STUDY [name] 03/30/2014   Procedure: GIVENS CAPSULE STUDY;  Surgeon: Margo LITTIE Haddock, MD;  [facility]: [facility];  Service: Endoscopy;  Laterality: [name];  7:30   GROIN DEBRIDEMENT Right 04/07/2020   Procedure: INCISION AND DRAINAGE OF RIGHT GROIN;  Surgeon: Serene Gaile ORN, MD;  Location: [facility] OR;  Service: Vascular;  Laterality: Right;   I & D EXTREMITY Left  09/03/2017   Procedure: IRRIGATION AND DEBRIDEMENT EXTREMITY LEFT THIGH SEROMA;  Surgeon: Laurence Redell CROME, MD;  Location: [facility] OR;  Service: Vascular;  Laterality: Left;   INSERT / REPLACE / REMOVE PACEMAKER     INSERTION OF ILIAC STENT  03/31/2020   Procedure: INSERTION OF RIGHT ILIAC ARTERY STENT AND RIGHT SUPERFICIAL FEMORAL ARTERY STENT;  Surgeon: Serene Gaile ORN, MD;  Location[facility];  Service: Vascular;;   IR ANGIO INTRA EXTRACRAN SEL COM CAROTID INNOMINATE BILAT MOD SED  07/16/2017   IR ANGIO INTRA EXTRACRAN  SEL COM CAROTID INNOMINATE BILAT MOD SED  06/12/2019   IR ANGIO INTRA EXTRACRAN SEL COM CAROTID INNOMINATE BILAT MOD SED  01/17/2021   IR ANGIO VERTEBRAL SEL SUBCLAVIAN INNOMINATE BILAT MOD SED  07/16/2017   IR ANGIO VERTEBRAL SEL SUBCLAVIAN INNOMINATE BILAT MOD SED  06/12/2019   IR ANGIO VERTEBRAL SEL SUBCLAVIAN INNOMINATE BILAT MOD SED  01/17/2021   IR RADIOLOGIST EVAL & MGMT  04/01/2017   IR RADIOLOGIST EVAL & MGMT  08/02/2017   IR TRANSCATH EXCRAN VERT OR CAR A STENT  07/22/2019   IR US  GUIDE VASC ACCESS RIGHT  06/12/2019   IR US  GUIDE VASC ACCESS RIGHT  01/17/2021   JOINT REPLACEMENT Right    Total Shoulder Replacement    LOWER EXTREMITY ANGIOGRAPHY Bilateral 06/25/2017   Procedure: Lower Extremity Angiography;  Surgeon: Serene Gaile ORN, MD;  [facility];  Service: Cardiovascular;  Laterality: Bilateral;   LUMBAR FUSION  2010   PATCH ANGIOPLASTY Right 03/31/2020   Procedure: PATCH ANGIOPLASTY USING XENOSURE BIOLOGIC PATCH 1cm x 14cm;  Surgeon: Serene Gaile ORN, MD;  Location: [facility] OR;  Service: Vascular;  Laterality: Right;   PERIPHERAL VASCULAR ATHERECTOMY Right 04/22/2018   Procedure: PERIPHERAL VASCULAR ATHERECTOMY;  Surgeon: Serene Gaile ORN, MD;  [facility] LAB;  Service: Cardiovascular;  Laterality: Right;  right superficial femoral   PERIPHERAL VASCULAR BALLOON ANGIOPLASTY Right 04/22/2018   Procedure: PERIPHERAL VASCULAR BALLOON ANGIOPLASTY;  Surgeon: Serene Gaile ORN, MD;  [facility]: [facility] CV LAB;  Service: Cardiovascular;  Laterality: Right;  external iliac   PERIPHERAL VASCULAR BALLOON ANGIOPLASTY Left 03/08/2020   Procedure: PERIPHERAL VASCULAR BALLOON ANGIOPLASTY;  Surgeon: Serene Gaile ORN, MD;  [facility] LAB;  Service: Cardiovascular;  Laterality: Left;  Common Femoral   PERIPHERAL VASCULAR BALLOON ANGIOPLASTY Left 08/23/2020   Procedure: PERIPHERAL VASCULAR BALLOON ANGIOPLASTY;  Surgeon: Serene Gaile ORN, MD;  [facility];   Service: Cardiovascular;  Laterality: Left;  common femoral   PERIPHERAL VASCULAR BALLOON ANGIOPLASTY Left 10/31/2021   Procedure: PERIPHERAL VASCULAR BALLOON ANGIOPLASTY;  Surgeon: Serene Gaile ORN, MD;  [facility];  Service: Cardiovascular;  Laterality: Left;  external iliac   PERIPHERAL VASCULAR INTERVENTION Right 03/08/2020   Procedure: PERIPHERAL VASCULAR INTERVENTION;  Surgeon: Serene Gaile ORN, MD;  [facility];  Service: Cardiovascular;  Laterality: Right;  SFA   PERIPHERAL VASCULAR INTERVENTION Right 08/23/2020   Procedure: PERIPHERAL VASCULAR INTERVENTION;  Surgeon: Serene Gaile ORN, MD;  [facility];  Service: Cardiovascular;  Laterality: Right;  external iliac   PERIPHERAL VASCULAR INTERVENTION Right 10/31/2021   Procedure: PERIPHERAL VASCULAR INTERVENTION;  Surgeon: Serene Gaile ORN, MD;  [facility];  Service: Cardiovascular;  Laterality: Right;  superficial femoral artery   PERIPHERAL VASCULAR INTERVENTION Left 12/19/2021   Procedure: PERIPHERAL VASCULAR INTERVENTION;  Surgeon: Serene Gaile ORN, MD;  Location: [facility] LAB;  Service:  Cardiovascular;  Laterality: Left;  stent lt ext iliac / pta common femoral   PERIPHERAL VASCULAR INTERVENTION  02/19/2023   Procedure: PERIPHERAL VASCULAR INTERVENTION;  Surgeon: Serene Gaile ORN, MD;  [facility]: [facility] CV LAB;  Service: Cardiovascular;;   RADIOLOGY WITH ANESTHESIA [name] 07/01/2019   Procedure: STENTING;  Surgeon: Dolphus Carrion, MD;  [facility]: [facility] OR;  Service: Radiology;  Laterality: [name];   RADIOLOGY WITH ANESTHESIA [name] 07/22/2019   Procedure: STENTING;  Surgeon: Dolphus Carrion, MD;  Location: [facility] OR;  Service: Radiology;  Laterality: [name];   TOTAL SHOULDER ARTHROPLASTY Right 2011   Dr. Dozier COWBOY NERVE TRANSPOSITION Left 02/02/2016   Procedure: LEFT IN-SITU DECOMPRESSION ULNAR NERVE ;  Surgeon: Arley Curia, MD;  Location: [facility];   Service: Orthopedics;  Laterality: Left;   ULNAR TUNNEL RELEASE Left 02/02/2016   Procedure: LEFT CUBITAL TUNNEL RELEASE;  Surgeon: Arley Curia, MD;  Location:  SURGERY CENTER;  Service: Orthopedics;  Laterality: Left;   VASECTOMY  1971   VEIN HARVEST Left 08/16/2017   Procedure: USING NON REVERSE LEFT GREATER SAPHENOUS VEIN HARVEST;  Surgeon: Serene Gaile ORN, MD;  Location: [facility] OR;  Service: Vascular;  Laterality: Left;    Current Outpatient Medications  Medication Sig Dispense Refill   aspirin  EC 81 MG tablet Take 81 mg by mouth every evening.      atorvastatin  (LIPITOR ) 20 MG tablet Take 20 mg by mouth every evening.     BRILINTA  90 MG TABS tablet Take 90 mg by mouth 2 (two) times daily.     carvedilol  (COREG ) 6.25 MG tablet TAKE 1 TABLET TWICE A DAY WITH MEALS (DOSE CHANGE, NEW PRESCRIPTION) 180 tablet 2   cetirizine (ZYRTEC) 10 MG tablet Take 10 mg by mouth daily.     Cobalamin Combinations (NEURIVA PLUS) CAPS Take 1 capsule by mouth daily.     dextromethorphan -guaiFENesin  (MUCINEX  DM) 30-600 MG 12hr tablet Take 1 tablet by mouth 2 (two) times daily.     gabapentin  (NEURONTIN ) 300 MG capsule Take 300 mg by mouth 2 (two) times daily.      HYDROcodone -acetaminophen  (NORCO/VICODIN) 5-325 MG tablet Take 1-2 tablets by mouth See admin instructions. Take 2 tablets in the morning and evening, may take 1 tablet at lunch as needed for pain     Magnesium  250 MG TABS Take 250 mg by mouth every evening.     Multiple Vitamins-Minerals (CENTRUM SILVER PO) Take 1 tablet by mouth daily. 50+     Naphazoline HCl (CLEAR EYES OP) Place 1 drop into both eyes daily as needed (dry eyes).     Omega-3 Fatty Acids (FISH OIL ) 1000 MG CAPS Take 1,000 mg by mouth every evening.     sacubitril -valsartan  (ENTRESTO ) 97-103 MG TAKE 1 TABLET TWICE A DAY (OVERDUE FOR APPOINTMENT) 60 tablet 0   Salicylic Acid (GOLD BOND PSORIASIS RELIEF) 3 % CREA Apply 1 application topically daily as needed (psoriasis).      silodosin (RAPAFLO) 8 MG CAPS capsule Take 8 mg by mouth daily with breakfast.     SUPER B COMPLEX/C PO Take 1 tablet by mouth every evening.     tamsulosin  (FLOMAX ) 0.4 MG CAPS capsule Take 0.4 mg by mouth daily after supper.     traMADol (ULTRAM) 50 MG tablet Take 50 mg by mouth 2 (two) times daily as needed for moderate pain (pain score 4-6).     No current facility-administered medications for this visit.    Allergies as of 06/18/2024   (  No Known Allergies)    Family History  Problem Relation Age of Onset   Coronary artery disease Mother    Diabetes Mother    Heart disease Mother        Before age 51 - 5 Bypasses   Hypertension Mother    Heart attack Mother        3-4 Heart attacks   Alzheimer's disease Father    Diabetes Father    Heart attack Sister    Cancer Brother        Prostate   Hyperlipidemia Son    Prostate cancer Brother    Alzheimer's disease Sister    Colon cancer Neg Hx     Social History   Socioeconomic History   Marital status: Married    Spouse name: Giovanne Nickolson   Number of children: 2   Years of education: 12   Highest education level: 12th grade  Occupational History   Occupation: retired, Personnel officer    Comment: Architect: RETIRED  Tobacco Use   Smoking status: Every Day    Current packs/day: 1.00    Average packs/day: 1 pack/day for 66.8 years (66.8 ttl pk-yrs)    Types: Cigarettes    Start date: 08/20/1957    Passive exposure: Current   Smokeless tobacco: Never  Vaping Use   Vaping status: Former   Start date: 09/10/2016   Quit date: 10/11/2016  Substance and Sexual Activity   Alcohol use: No    Alcohol/week: 0.0 standard drinks of alcohol    Comment: quit in July 2015   Drug use: No   Sexual activity: Not Currently  Other Topics Concern   Not on file  Social History Narrative   Accompanied by daughter Joen Oak   Lives w/ wife, daughter   Social Drivers of Health   Financial Resource Strain: Low Risk   (06/21/2022)   Overall Financial Resource Strain (CARDIA)    Difficulty of Paying Living Expenses: Not hard at all  Food Insecurity: No Food Insecurity (06/21/2022)   Hunger Vital Sign    Worried About Running Out of Food in the Last Year: Never true    Ran Out of Food in the Last Year: Never true  Transportation Needs: No Transportation Needs (06/21/2022)   PRAPARE - Administrator, Civil Service (Medical): No    Lack of Transportation (Non-Medical): No  Physical Activity: Inactive (06/21/2022)   Exercise Vital Sign    Days of Exercise per Week: 0 days    Minutes of Exercise per Session: 0 min  Stress: No Stress Concern Present (06/21/2022)   [employer] of Occupational Health - Occupational Stress Questionnaire    Feeling of Stress : Not at all  Social Connections: Moderately Integrated (06/21/2022)   Social Connection and Isolation Panel    Frequency of Communication with Friends and Family: More than three times a week    Frequency of Social Gatherings with Friends and Family: More than three times a week    Attends Religious Services: More than 4 times per year    Active Member of [employer] or Organizations: No    Attends Banker Meetings: Never    Marital Status: Married    Review of Systems: Gen: Denies fever, chills, cold or flulike symptoms, presyncope, syncope. CV: Denies chest pain, palpitations. Resp: Denies dyspnea at rest, cough. GI: See HPI Heme: See HPI  Physical Exam: BP 136/73 (BP Location: Right Arm, Patient Position: Sitting, Cuff Size: Normal)  Pulse 60   Temp 98.1 F (36.7 C) (Temporal)   Ht 5' 3 (1.6 m)   Wt 143 lb 12.8 oz (65.2 kg)   BMI 25.47 kg/m  General:   Alert and oriented. No distress noted. Pleasant and cooperative.  Head:  Normocephalic and atraumatic. Eyes:  Conjuctiva clear without scleral icterus. Heart:  S1, S2 present without murmurs appreciated. Lungs:  Clear to auscultation bilaterally. No wheezes, rales, or  rhonchi. No distress.  Abdomen:  +BS, soft, non-tender and non-distended. No rebound or guarding. No HSM or masses noted. Msk:  Symmetrical without gross deformities. Normal posture. Extremities:  Without edema. Neurologic:  Alert and  oriented x4 Psych:  Normal mood and affect.    Assessment:  78 year old male with a history of peripheral artery disease requiring multiple interventions and maintained on Brilinta  and aspirin , TIA, COPD, MI, chronic systolic heart failure s/p biventricular ICD, HTN, HLD, presenting today to discuss scheduling screening colonoscopy.  Clinically, he is doing well from a GI standpoint without any significant symptoms.  No alarm symptoms.  Last colonoscopy was in May 2015 with entirely normal exam.  Patient is ready to schedule his colonoscopy, but is very concerned about stopping Brilinta  due to his significant history of peripheral artery disease.   Discussed with patient that we could consider doing a colonoscopy on Brilinta , but if he had a large polyp, we would not be able to remove the polyp at that time, but he would have to come back for repeat procedure off Brilinta .  He would be willing to do this. Will need to discuss this possibility with Dr. Shaaron.   Plan:  Will plan to get colonoscopy scheduled in the near future pending discussion with Dr. Shaaron regarding Brilinta  management perioperatively.  Patient requesting to continue Brilinta .   Follow-up as needed.   Josette Centers, PA-C [facility] 06/18/2024

## 2024-06-16 NOTE — Progress Notes (Unsigned)
 Referring Provider: Shona Norleen PEDLAR, MD Primary Care Physician:  Shona Norleen PEDLAR, MD Primary GI Physician: Dr. Shaaron  No chief complaint on file.   HPI:   Roy [name] is a 78 y.o. male presenting today with a history of peripheral artery disease, TIA, COPD, MI, chronic systolic heart failure s/p biventricular ICD, HTN, HLD, presenting today to discuss scheduling screening colonoscopy.  Last colonoscopy 04/14/2014 with normal exam.  Recommended 2-year repeat.  Reviewed labs available scanned into the chart dated 06/03/2024.  Potassium slightly elevated at 5.3, but otherwise CMP within normal limits.  CBC within normal limits aside from slight elevation of absolute eosinophils at 0.6.   Today:   Past Medical History:  Diagnosis Date   AICD (automatic cardioverter/defibrillator) present 08/19/2015   St Jude BiV ICD for primary prevention by Dr. Waddell   Alcohol abuse    6 beers per day; hospital admission in 2009 for withdrawal symptoms   Alcoholic cirrhosis (HCC)    Anxiety and depression    denies    Cerebrovascular disease 2009   TIA; 2009- right ICA stent; re-intervention for restenosis complicated by [facility] w/o sx   CHF (congestive heart failure) (HCC) 06-02-14   Chronic obstructive pulmonary disease (HCC)    Degenerative joint disease    Total shoulder arthroplasty-right   Hyperlipidemia    Hypertension    LBBB (left bundle branch block)    Normal echo-2011; stress nuclear in 09/2010--septal hypoperfusion representing nontransmural infarction or the effect of left bundle branch block, no ischemia   Myocardial infarction [facility]) June 02, 2014   Massive Heart Attack   Peripheral vascular disease (HCC)    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   Thrombocytopenia (HCC)    Tobacco abuse    -100 pack years; 1.5 packs per day   Traumatic seroma of thigh (HCC)    left   Tubular adenoma of colon     Past Surgical History:  Procedure Laterality Date   ABDOMINAL  AORTAGRAM [name] 05/04/2014   Procedure: ABDOMINAL EZELLA;  Surgeon: Gaile LELON New, MD;  Location: [facility] CATH LAB;  Service: Cardiovascular;  Laterality: [name];   ABDOMINAL AORTOGRAM [name] 06/25/2017   Procedure: Abdominal Aortogram;  Surgeon: New Gaile LELON, MD;  Location: [facility] INVASIVE CV LAB;  Service: Cardiovascular;  Laterality: [name];   ABDOMINAL AORTOGRAM W/LOWER EXTREMITY [name] 04/22/2018   Procedure: ABDOMINAL AORTOGRAM W/LOWER EXTREMITY;  Surgeon: New Gaile LELON, MD;  [facility]: [facility] INVASIVE CV LAB;  Service: Cardiovascular;  Laterality: [name];   ABDOMINAL AORTOGRAM W/LOWER EXTREMITY [name] 03/08/2020   Procedure: ABDOMINAL AORTOGRAM W/LOWER EXTREMITY;  Surgeon: New Gaile LELON, MD;  [facility]: [facility] INVAS[facility] CV LAB;  Service: Cardiovascular;  Laterality: [name];   ABDOMINAL AORTOGRAM W/LOWER EXTREMITY Bilateral 08/23/2020   Procedure: ABDOMINAL AORTOGRAM W/LOWER EXTREMITY;  Surgeon: New Gaile LELON, MD;  [facility] INVAS[facility] CV LAB;  Service: Cardiovascular;  Laterality: Bilateral;   ABDOMINAL AORTOGRAM W/LOWER EXTREMITY [name] 10/31/2021   Procedure: ABDOMINAL AORTOGRAM W/LOWER EXTREMITY;  Surgeon: New Gaile LELON, MD;  [facility] CV LAB;  Service: Cardiovascular;  Laterality: [name];   ABDOMINAL AORTOGRAM W/LOWER EXTREMITY [name] 12/19/2021   Procedure: ABDOMINAL AORTOGRAM W/LOWER EXTREMITY;  Surgeon: New Gaile LELON, MD;  [facility];  Service: Cardiovascular;  Laterality: [name];   ABDOMINAL AORTOGRAM W/LOWER EXTREMITY Bilateral 02/19/2023   Procedure: ABDOMINAL AORTOGRAM W/LOWER EXTREMITY;  Surgeon: New Gaile LELON, MD;  [facility] CV LAB;  Service: Cardiovascular;  Laterality: Bilateral;   AGILE CAPSULE [name]  03/18/2014   Procedure: AGILE CAPSULE;  Surgeon: Margo LITTIE Haddock, MD;  Location: [facility];  Service: Endoscopy;  Laterality: [name];  7:30   APPLICATION OF WOUND VAC Left 09/03/2017   Procedure: APPLICATION OF WOUND VAC;  Surgeon: Laurence Redell LITTIE, MD;  Location: [facility] OR;   Service: Vascular;  Laterality: Left;   APPLICATION OF WOUND VAC Right 04/07/2020   Procedure: PLACEMENT OF ANTIBIOTIC BEADS AND APPLICATION OF WOUND VAC;  Surgeon: Serene Gaile ORN, MD;  [facility] OR;  Service: Vascular;  Laterality: Right;   BACK SURGERY     BACTERIAL OVERGROWTH TEST [name] 05/24/2015   Procedure: BACTERIAL OVERGROWTH TEST;  Surgeon: Margo LITTIE Haddock, MD;  [facility]: [facility];  Service: Endoscopy;  Laterality: [name];  0700   BI-VENTRICULAR IMPLANTABLE CARDIOVERTER DEFIBRILLATOR  (CRT-D)  08/19/2015   BIV ICD GENERATOR CHANGEOUT [name] 11/11/2023   Procedure: BIV ICD GENERATOR CHANGEOUT;  Surgeon: Waddell Danelle ORN, MD;  Location: [facility] INVASIVE CV LAB;  Service: Cardiovascular;  Laterality: [name];   CARPAL TUNNEL RELEASE Left 02/02/2016   Procedure: LEFT CARPAL TUNNEL RELEASE;  Surgeon: Arley Curia, MD;  Location: [facility];  Service: Orthopedics;  Laterality: Left;  ANESTHESIA: IV REGIONAL UPPER ARM   CATARACT EXTRACTION W/PHACO Right 03/08/2015   Procedure: CATARACT EXTRACTION PHACO AND INTRAOCULAR LENS PLACEMENT (IOC);  Surgeon: Oneil Platts, MD;  Location: [facility] ORS;  Service: Ophthalmology;  Laterality: Right;  CDE:9.46   CATARACT EXTRACTION W/PHACO Left 03/22/2015   Procedure: CATARACT EXTRACTION PHACO AND INTRAOCULAR LENS PLACEMENT (IOC);  Surgeon: Oneil Platts, MD;  Location: [facility] ORS;  Service: Ophthalmology;  Laterality: Left;  CDE:5.80   COLONOSCOPY  08/22/09   Fields-(Tubular Adenoma)3-mm transverse polyp/4-mm polyp otherwise noraml/small internal hemorrhoids   COLONOSCOPY [name] 04/14/2014   DOQ:dfjoo internal hemorrhids/normal mocsa in the terminal iluem/left colonis redundant   COLONOSCOPY W/ POLYPECTOMY  2011   ENDARTERECTOMY FEMORAL Left 08/16/2017   Procedure: ENDARTERECTOMY LEFT PROFUNDA FEMORAL;  Surgeon: Serene Gaile ORN, MD;  Location: [facility] OR;  Service: Vascular;  Laterality: Left;   ENDARTERECTOMY FEMORAL Right 03/31/2020   Procedure: RIGHT FEMORAL ENDARTERECTOMY;   Surgeon: Serene Gaile ORN, MD;  [facility] OR;  Service: Vascular;  Laterality: Right;   EP IMPLANTABLE DEVICE [name] 08/19/2015   Procedure: BiV ICD Insertion CRT-D;  Surgeon: Danelle ORN Waddell, MD;  Location: [facility] INVASIVE CV LAB;  Service: Cardiovascular;  Laterality: [name];   ESOPHAGOGASTRODUODENOSCOPY [name] 03/05/2014   SLF: 1. Stricture at the gastroesophagael junction 2. Small hiatal hernia 3. Moderate non-erosive gastritis and duodentitis. 4. No surce for Melena identified.    FEMORAL-POPLITEAL BYPASS GRAFT Left 08/16/2017   Procedure: LEFT  FEMORAL-POPLITEAL ARTERY  BYPASS GRAFT;  Surgeon: Serene Gaile ORN, MD;  [facility];  Service: Vascular;  Laterality: Left;   GIVENS CAPSULE STUDY [name] 03/30/2014   Procedure: GIVENS CAPSULE STUDY;  Surgeon: Margo LITTIE Haddock, MD;  [facility];  Service: Endoscopy;  Laterality: [name];  7:30   GROIN DEBRIDEMENT Right 04/07/2020   Procedure: INCISION AND DRAINAGE OF RIGHT GROIN;  Surgeon: Serene Gaile ORN, MD;  [facility] OR;  Service: Vascular;  Laterality: Right;   I & D EXTREMITY Left 09/03/2017   Procedure: IRRIGATION AND DEBRIDEMENT EXTREMITY LEFT THIGH SEROMA;  Surgeon: Laurence Redell LITTIE, MD;  Location: [facility] OR;  Service: Vascular;  Laterality: Left;   INSERT / REPLACE / REMOVE PACEMAKER     INSERTION OF ILIAC STENT  03/31/2020   Procedure: INSERTION OF RIGHT ILIAC ARTERY STENT AND  RIGHT SUPERFICIAL FEMORAL ARTERY STENT;  Surgeon: Serene Gaile ORN, MD;  [facility];  Service: Vascular;;   IR ANGIO INTRA EXTRACRAN SEL COM CAROTID INNOMINATE BILAT MOD SED  07/16/2017   IR ANGIO INTRA EXTRACRAN SEL COM CAROTID INNOMINATE BILAT MOD SED  06/12/2019   IR ANGIO INTRA EXTRACRAN SEL COM CAROTID INNOMINATE BILAT MOD SED  01/17/2021   IR ANGIO VERTEBRAL SEL SUBCLAVIAN INNOMINATE BILAT MOD SED  07/16/2017   IR ANGIO VERTEBRAL SEL SUBCLAVIAN INNOMINATE BILAT MOD SED  06/12/2019   IR ANGIO VERTEBRAL SEL SUBCLAVIAN INNOMINATE BILAT MOD SED  01/17/2021   IR RADIOLOGIST EVAL  & MGMT  04/01/2017   IR RADIOLOGIST EVAL & MGMT  08/02/2017   IR TRANSCATH EXCRAN VERT OR CAR A STENT  07/22/2019   IR US  GUIDE VASC ACCESS RIGHT  06/12/2019   IR US  GUIDE VASC ACCESS RIGHT  01/17/2021   JOINT REPLACEMENT Right    Total Shoulder Replacement    LOWER EXTREMITY ANGIOGRAPHY Bilateral 06/25/2017   Procedure: Lower Extremity Angiography;  Surgeon: Serene Gaile ORN, MD;  [facility];  Service: Cardiovascular;  Laterality: Bilateral;   LUMBAR FUSION  2010   PATCH ANGIOPLASTY Right 03/31/2020   Procedure: PATCH ANGIOPLASTY USING XENOSURE BIOLOGIC PATCH 1cm x 14cm;  Surgeon: Serene Gaile ORN, MD;  Location: [facility] OR;  Service: Vascular;  Laterality: Right;   PERIPHERAL VASCULAR ATHERECTOMY Right 04/22/2018   Procedure: PERIPHERAL VASCULAR ATHERECTOMY;  Surgeon: Serene Gaile ORN, MD;  [facility];  Service: Cardiovascular;  Laterality: Right;  right superficial femoral   PERIPHERAL VASCULAR BALLOON ANGIOPLASTY Right 04/22/2018   Procedure: PERIPHERAL VASCULAR BALLOON ANGIOPLASTY;  Surgeon: Serene Gaile ORN, MD;  [facility];  Service: Cardiovascular;  Laterality: Right;  external iliac   PERIPHERAL VASCULAR BALLOON ANGIOPLASTY Left 03/08/2020   Procedure: PERIPHERAL VASCULAR BALLOON ANGIOPLASTY;  Surgeon: Serene Gaile ORN, MD;  [facility];  Service: Cardiovascular;  Laterality: Left;  Common Femoral   PERIPHERAL VASCULAR BALLOON ANGIOPLASTY Left 08/23/2020   Procedure: PERIPHERAL VASCULAR BALLOON ANGIOPLASTY;  Surgeon: Serene Gaile ORN, MD;  [facility];  Service: Cardiovascular;  Laterality: Left;  common femoral   PERIPHERAL VASCULAR BALLOON ANGIOPLASTY Left 10/31/2021   Procedure: PERIPHERAL VASCULAR BALLOON ANGIOPLASTY;  Surgeon: Serene Gaile ORN, MD;  [facility];  Service: Cardiovascular;  Laterality: Left;  external iliac   PERIPHERAL VASCULAR INTERVENTION Right 03/08/2020   Procedure: PERIPHERAL  VASCULAR INTERVENTION;  Surgeon: Serene Gaile ORN, MD;  [facility];  Service: Cardiovascular;  Laterality: Right;  SFA   PERIPHERAL VASCULAR INTERVENTION Right 08/23/2020   Procedure: PERIPHERAL VASCULAR INTERVENTION;  Surgeon: Serene Gaile ORN, MD;  [facility];  Service: Cardiovascular;  Laterality: Right;  external iliac   PERIPHERAL VASCULAR INTERVENTION Right 10/31/2021   Procedure: PERIPHERAL VASCULAR INTERVENTION;  Surgeon: Serene Gaile ORN, MD;  [facility];  Service: Cardiovascular;  Laterality: Right;  superficial femoral artery   PERIPHERAL VASCULAR INTERVENTION Left 12/19/2021   Procedure: PERIPHERAL VASCULAR INTERVENTION;  Surgeon: Serene Gaile ORN, MD;  [facility];  Service: Cardiovascular;  Laterality: Left;  stent lt ext iliac / pta common femoral   PERIPHERAL VASCULAR INTERVENTION  02/19/2023   Procedure: PERIPHERAL VASCULAR INTERVENTION;  Surgeon: Serene Gaile ORN, MD;  [facility];  Service: Cardiovascular;;   RADIOLOGY WITH ANESTHESIA [name] 07/01/2019   Procedure: STENTING;  Surgeon: Dolphus,  Thyra, MD;  [facility] OR;  Service: Radiology;  Laterality: [name];   RADIOLOGY WITH ANESTHESIA [name] 07/22/2019   Procedure: STENTING;  Surgeon: Dolphus Thyra, MD;  [facility] OR;  Service: Radiology;  Laterality: [name];   TOTAL SHOULDER ARTHROPLASTY Right 2011   Dr. Dozier COWBOY NERVE TRANSPOSITION Left 02/02/2016   Procedure: LEFT IN-SITU DECOMPRESSION ULNAR NERVE ;  Surgeon: Arley Curia, MD;  Location: [facility];  Service: Orthopedics;  Laterality: Left;   ULNAR TUNNEL RELEASE Left 02/02/2016   Procedure: LEFT CUBITAL TUNNEL RELEASE;  Surgeon: Arley Curia, MD;  Location: [facility];  Service: Orthopedics;  Laterality: Left;   VASECTOMY  1971   VEIN HARVEST Left 08/16/2017   Procedure: USING NON REVERSE LEFT GREATER SAPHENOUS VEIN HARVEST;  Surgeon: Serene Gaile ORN, MD;   [facility] OR;  Service: Vascular;  Laterality: Left;    Current Outpatient Medications  Medication Sig Dispense Refill   aspirin  EC 81 MG tablet Take 81 mg by mouth every evening.      atorvastatin  (LIPITOR ) 20 MG tablet Take 20 mg by mouth every evening.     BRILINTA  90 MG TABS tablet Take 90 mg by mouth 2 (two) times daily.     carvedilol  (COREG ) 6.25 MG tablet TAKE 1 TABLET TWICE A DAY WITH MEALS (DOSE CHANGE, NEW PRESCRIPTION) 180 tablet 2   cetirizine (ZYRTEC) 10 MG tablet Take 10 mg by mouth daily.     Cobalamin Combinations (NEURIVA PLUS) CAPS Take 1 capsule by mouth daily.     dextromethorphan -guaiFENesin  (MUCINEX  DM) 30-600 MG 12hr tablet Take 1 tablet by mouth 2 (two) times daily.     doxycycline  (VIBRAMYCIN ) 100 MG capsule Take 1 capsule (100 mg total) by mouth 2 (two) times daily. For 7 days 14 capsule 0   gabapentin  (NEURONTIN ) 300 MG capsule Take 300 mg by mouth 2 (two) times daily.      HYDROcodone -acetaminophen  (NORCO/VICODIN) 5-325 MG tablet Take 1-2 tablets by mouth See admin instructions. Take 2 tablets in the morning and evening, may take 1 tablet at lunch as needed for pain     Magnesium  250 MG TABS Take 250 mg by mouth every evening.     Multiple Vitamins-Minerals (CENTRUM SILVER PO) Take 1 tablet by mouth daily. 50+     Naphazoline HCl (CLEAR EYES OP) Place 1 drop into both eyes daily as needed (dry eyes).     Omega-3 Fatty Acids (FISH OIL ) 1000 MG CAPS Take 1,000 mg by mouth every evening.     sacubitril -valsartan  (ENTRESTO ) 97-103 MG TAKE 1 TABLET TWICE A DAY (OVERDUE FOR APPOINTMENT) 60 tablet 0   Salicylic Acid (GOLD BOND PSORIASIS RELIEF) 3 % CREA Apply 1 application topically daily as needed (psoriasis).     silodosin (RAPAFLO) 8 MG CAPS capsule Take 8 mg by mouth daily with breakfast.     SUPER B COMPLEX/C PO Take 1 tablet by mouth every evening.     tamsulosin  (FLOMAX ) 0.4 MG CAPS capsule Take 0.4 mg by mouth daily after supper.     traMADol (ULTRAM) 50 MG  tablet Take 50 mg by mouth 2 (two) times daily as needed for moderate pain (pain score 4-6).     No current facility-administered medications for this visit.    Allergies as of 06/18/2024   (No Known Allergies)    Family History  Problem Relation Age of Onset   Coronary artery disease Mother    Diabetes Mother    Heart disease  Mother        Before age 46 - 5 Bypasses   Hypertension Mother    Heart attack Mother        3-4 Heart attacks   Alzheimer's disease Father    Diabetes Father    Heart attack Sister    Cancer Brother        Prostate   Hyperlipidemia Son    Prostate cancer Brother    Alzheimer's disease Sister    Colon cancer Neg Hx     Social History   Socioeconomic History   Marital status: Married    Spouse name: Galvin Aversa   Number of children: 2   Years of education: 12   Highest education level: 12th grade  Occupational History   Occupation: retired, Personnel officer    Comment: Architect: RETIRED  Tobacco Use   Smoking status: Every Day    Current packs/day: 1.00    Average packs/day: 1 pack/day for 66.8 years (66.8 ttl pk-yrs)    Types: Cigarettes    Start date: 08/20/1957    Passive exposure: Current   Smokeless tobacco: Never  Vaping Use   Vaping status: Former   Start date: 09/10/2016   Quit date: 10/11/2016  Substance and Sexual Activity   Alcohol use: No    Alcohol/week: 0.0 standard drinks of alcohol    Comment: quit in July 2015   Drug use: No   Sexual activity: Not Currently  Other Topics Concern   Not on file  Social History Narrative   Accompanied by daughter Joen Oak   Lives w/ wife, daughter   Social Drivers of Health   Financial Resource Strain: Low Risk  (06/21/2022)   Overall Financial Resource Strain (CARDIA)    Difficulty of Paying Living Expenses: Not hard at all  Food Insecurity: No Food Insecurity (06/21/2022)   Hunger Vital Sign    Worried About Running Out of Food in the Last Year: Never true     Ran Out of Food in the Last Year: Never true  Transportation Needs: No Transportation Needs (06/21/2022)   PRAPARE - Administrator, Civil Service (Medical): No    Lack of Transportation (Non-Medical): No  Physical Activity: Inactive (06/21/2022)   Exercise Vital Sign    Days of Exercise per Week: 0 days    Minutes of Exercise per Session: 0 min  Stress: No Stress Concern Present (06/21/2022)   [employer] of Occupational Health - Occupational Stress Questionnaire    Feeling of Stress : Not at all  Social Connections: Moderately Integrated (06/21/2022)   Social Connection and Isolation Panel    Frequency of Communication with Friends and Family: More than three times a week    Frequency of Social Gatherings with Friends and Family: More than three times a week    Attends Religious Services: More than 4 times per year    Active Member of [employer] or Organizations: No    Attends Banker Meetings: Never    Marital Status: Married    Review of Systems: Gen: Denies fever, chills, anorexia. Denies fatigue, weakness, weight loss.  CV: Denies chest pain, palpitations, syncope, peripheral edema, and claudication. Resp: Denies dyspnea at rest, cough, wheezing, coughing up blood, and pleurisy. GI: Denies vomiting blood, jaundice, and fecal incontinence.   Denies dysphagia or odynophagia. Derm: Denies rash, itching, dry skin Psych: Denies depression, anxiety, memory loss, confusion. No homicidal or suicidal ideation.  Heme: Denies bruising, bleeding, and enlarged lymph  nodes.  Physical Exam: There were no vitals taken for this visit. General:   Alert and oriented. No distress noted. Pleasant and cooperative.  Head:  Normocephalic and atraumatic. Eyes:  Conjuctiva clear without scleral icterus. Heart:  S1, S2 present without murmurs appreciated. Lungs:  Clear to auscultation bilaterally. No wheezes, rales, or rhonchi. No distress.  Abdomen:  +BS, soft, non-tender and  non-distended. No rebound or guarding. No HSM or masses noted. Msk:  Symmetrical without gross deformities. Normal posture. Extremities:  Without edema. Neurologic:  Alert and  oriented x4 Psych:  Normal mood and affect.    Assessment:     Plan:  ***   Josette Centers, PA-C [facility] 06/18/2024

## 2024-06-18 ENCOUNTER — Ambulatory Visit (INDEPENDENT_AMBULATORY_CARE_PROVIDER_SITE_OTHER): Admitting: Gastroenterology

## 2024-06-18 ENCOUNTER — Encounter: Payer: Self-pay | Admitting: Gastroenterology

## 2024-06-18 ENCOUNTER — Telehealth: Payer: Self-pay | Admitting: Gastroenterology

## 2024-06-18 VITALS — BP 136/73 | HR 60 | Temp 98.1°F | Ht 63.0 in | Wt 143.8 lb

## 2024-06-18 DIAGNOSIS — Z1211 Encounter for screening for malignant neoplasm of colon: Secondary | ICD-10-CM | POA: Insufficient documentation

## 2024-06-18 DIAGNOSIS — Z01818 Encounter for other preprocedural examination: Secondary | ICD-10-CM

## 2024-06-18 DIAGNOSIS — I739 Peripheral vascular disease, unspecified: Secondary | ICD-10-CM

## 2024-06-18 MED ORDER — PEG 3350-KCL-NA BICARB-NACL 420 G PO SOLR
4000.0000 mL | Freq: Once | ORAL | 0 refills | Status: AC
Start: 2024-06-18 — End: 2024-06-18

## 2024-06-18 NOTE — Telephone Encounter (Signed)
 Spoke with pt and he is aware of pre-op appt.

## 2024-06-18 NOTE — Addendum Note (Signed)
 Addended by: JEANELL GRAEME RAMAN on: 06/18/2024 11:52 AM   Modules accepted: Orders

## 2024-06-18 NOTE — Patient Instructions (Signed)
 I will reach out to Dr. Shaaron regarding the possibility of continuing Brilinta  for your colonoscopy.  Will reach out to you with his recommendations and get you scheduled over the phone.  It was great to meet you today!  Josette Centers, PA-C [facility]

## 2024-06-18 NOTE — Telephone Encounter (Signed)
 Mindy/Tammy:  Please let patient know that I discussed his case with Dr. Shaaron who is agreeable to continue Brilinta  for his colonoscopy.  Please arrange colonoscopy with Dr. Shaaron. Dx: Colon cancer screening ASA 3

## 2024-06-18 NOTE — Telephone Encounter (Signed)
 Spoke with pt. He is aware and has been scheduled for 8/6. He will pick up instructions from office. Aware will call back with pre-op

## 2024-06-23 ENCOUNTER — Other Ambulatory Visit: Payer: Self-pay | Admitting: Thoracic Surgery (Cardiothoracic Vascular Surgery)

## 2024-06-23 ENCOUNTER — Ambulatory Visit
Attending: Thoracic Surgery (Cardiothoracic Vascular Surgery) | Admitting: Thoracic Surgery (Cardiothoracic Vascular Surgery)

## 2024-06-23 ENCOUNTER — Encounter: Payer: Self-pay | Admitting: Thoracic Surgery (Cardiothoracic Vascular Surgery)

## 2024-06-23 VITALS — BP 161/74 | HR 70 | Resp 20 | Ht 63.0 in | Wt 143.0 lb

## 2024-06-23 DIAGNOSIS — R918 Other nonspecific abnormal finding of lung field: Secondary | ICD-10-CM | POA: Insufficient documentation

## 2024-06-23 NOTE — Progress Notes (Signed)
-  6 Minute Walk Test Results  Patient: Roy [name] Date:  06/23/2024   Supplemental O2 during test? No      Baseline   End  Time   1603    1614 Heartrate  70    98 Dyspnea  No    No Fatigue  No    No O2 sat   96%    96% Blood pressure 161/74    -   Patient ambulated at a fast pace for a total distance of 1372 feet with 0 stops.  Ambulation was limited primarily due to [name]  Overall the test was tolerated very well.

## 2024-06-23 NOTE — Progress Notes (Signed)
 PCP is Shona Norleen PEDLAR, MD Referring Provider is Shona Norleen PEDLAR, MD  Chief Complaint  Patient presents with   Lung Lesion    New patient consult. Chest CT 5/17    HPI: Mr. Montoya is seen for consultation regarding a right lower lobe lung nodule.  Maverik Foot is a 78 year old man with an extensive past medical history including tobacco abuse, emphysema, MI, left bundle branch block, congestive heart failure, AICD, ethanol abuse and withdrawal, cirrhosis, extensive PAD, PAD, hypertension, hyperlipidemia, and thrombocytopenia.  He has smoked about a pack a day for almost 67 years.  He had a low-dose CT for lung cancer screening in May.  It showed a 3.1 cm spiculated right lower lobe mass.  There was no hilar or mediastinal adenopathy.  There were additional small pulmonary nodules scattered bilaterally.  Denies chest pain, pressure, tightness, or shortness of breath.  He does complain that he feels short of breath when he first lies flat on his back but that goes away within less than a minute and then he can sleep lying flat without any difficulty.  No shortness of breath with activity.  No change in appetite or weight loss.  No headaches or visual changes.  Zubrod Score: At the time of surgery this patient's most appropriate activity status/level should be described as: []     0    Normal activity, no symptoms [x]     1    Restricted in physical strenuous activity but ambulatory, able to do out light work []     2    Ambulatory and capable of self care, unable to do work activities, up and about >50 % of waking hours                              []     3    Only limited self care, in bed greater than 50% of waking hours []     4    Completely disabled, no self care, confined to bed or chair []     5    Moribund   Past Medical History:  Diagnosis Date   AICD (automatic cardioverter/defibrillator) present 08/19/2015   [employer] Jude BiV ICD for primary prevention by Dr. Waddell   Alcohol abuse    6  beers per day; hospital admission in 2009 for withdrawal symptoms   Anxiety and depression    denies    Cerebrovascular disease 2009   TIA; 2009- right ICA stent; re-intervention for restenosis complicated by [facility] w/o sx   CHF (congestive heart failure) (HCC) 06/02/2014   Chronic obstructive pulmonary disease (HCC)    Degenerative joint disease    Total shoulder arthroplasty-right   Hyperlipidemia    Hypertension    LBBB (left bundle branch block)    Normal echo-2011; stress nuclear in 09/2010--septal hypoperfusion representing nontransmural infarction or the effect of left bundle branch block, no ischemia   Myocardial infarction (HCC) 06/02/2014   Massive Heart Attack   Peripheral vascular disease (HCC)    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   Thrombocytopenia (HCC)    Tobacco abuse    -100 pack years; 1.5 packs per day   Traumatic seroma of thigh (HCC)    left   Tubular adenoma of colon     Past Surgical History:  Procedure Laterality Date   ABDOMINAL AORTAGRAM [name] 05/04/2014   Procedure: ABDOMINAL EZELLA;  Surgeon: Gaile LELON New, MD;  Location: [facility] CATH LAB;  Service: Cardiovascular;  Laterality: [name];   ABDOMINAL AORTOGRAM [name] 06/25/2017   Procedure: Abdominal Aortogram;  Surgeon: Serene Gaile ORN, MD;  Location: [facility] INVASIVE CV LAB;  Service: Cardiovascular;  Laterality: [name];   ABDOMINAL AORTOGRAM W/LOWER EXTREMITY [name] 04/22/2018   Procedure: ABDOMINAL AORTOGRAM W/LOWER EXTREMITY;  Surgeon: Serene Gaile ORN, MD;  [facility]: [facility];  Service: Cardiovascular;  Laterality: [name];   ABDOMINAL AORTOGRAM W/LOWER EXTREMITY [name] 03/08/2020   Procedure: ABDOMINAL AORTOGRAM W/LOWER EXTREMITY;  Surgeon: Serene Gaile ORN, MD;  [facility];  Service: Cardiovascular;  Laterality: [name];   ABDOMINAL AORTOGRAM W/LOWER EXTREMITY Bilateral 08/23/2020   Procedure: ABDOMINAL AORTOGRAM W/LOWER EXTREMITY;  Surgeon: Serene Gaile ORN, MD;  [facility] CV  LAB;  Service: Cardiovascular;  Laterality: Bilateral;   ABDOMINAL AORTOGRAM W/LOWER EXTREMITY [name] 10/31/2021   Procedure: ABDOMINAL AORTOGRAM W/LOWER EXTREMITY;  Surgeon: Serene Gaile ORN, MD;  [facility];  Service: Cardiovascular;  Laterality: [name];   ABDOMINAL AORTOGRAM W/LOWER EXTREMITY [name] 12/19/2021   Procedure: ABDOMINAL AORTOGRAM W/LOWER EXTREMITY;  Surgeon: Serene Gaile ORN, MD;  [facility];  Service: Cardiovascular;  Laterality: [name];   ABDOMINAL AORTOGRAM W/LOWER EXTREMITY Bilateral 02/19/2023   Procedure: ABDOMINAL AORTOGRAM W/LOWER EXTREMITY;  Surgeon: Serene Gaile ORN, MD;  [facility];  Service: Cardiovascular;  Laterality: Bilateral;   AGILE CAPSULE [name] 03/18/2014   Procedure: AGILE CAPSULE;  Surgeon: Margo LITTIE Haddock, MD;  [facility];  Service: Endoscopy;  Laterality: [name];  7:30   APPLICATION OF WOUND VAC Left 09/03/2017   Procedure: APPLICATION OF WOUND VAC;  Surgeon: Laurence Redell LITTIE, MD;  Location: [facility] OR;  Service: Vascular;  Laterality: Left;   APPLICATION OF WOUND VAC Right 04/07/2020   Procedure: PLACEMENT OF ANTIBIOTIC BEADS AND APPLICATION OF WOUND VAC;  Surgeon: Serene Gaile ORN, MD;  [facility] OR;  Service: Vascular;  Laterality: Right;   BACK SURGERY     BACTERIAL OVERGROWTH TEST [name] 05/24/2015   Procedure: BACTERIAL OVERGROWTH TEST;  Surgeon: Margo LITTIE Haddock, MD;  [facility];  Service: Endoscopy;  Laterality: [name];  0700   BI-VENTRICULAR IMPLANTABLE CARDIOVERTER DEFIBRILLATOR  (CRT-D)  08/19/2015   BIV ICD GENERATOR CHANGEOUT [name] 11/11/2023   Procedure: BIV ICD GENERATOR CHANGEOUT;  Surgeon: Waddell Danelle ORN, MD;  Location: [facility] INVASIVE CV LAB;  Service: Cardiovascular;  Laterality: [name];   CARPAL TUNNEL RELEASE Left 02/02/2016   Procedure: LEFT CARPAL TUNNEL RELEASE;  Surgeon: Arley Curia, MD;  Location: [facility];  Service: Orthopedics;  Laterality: Left;  ANESTHESIA: IV REGIONAL UPPER ARM    CATARACT EXTRACTION W/PHACO Right 03/08/2015   Procedure: CATARACT EXTRACTION PHACO AND INTRAOCULAR LENS PLACEMENT (IOC);  Surgeon: Oneil Platts, MD;  Location: [facility] ORS;  Service: Ophthalmology;  Laterality: Right;  CDE:9.46   CATARACT EXTRACTION W/PHACO Left 03/22/2015   Procedure: CATARACT EXTRACTION PHACO AND INTRAOCULAR LENS PLACEMENT (IOC);  Surgeon: Oneil Platts, MD;  Location: [facility];  Service: Ophthalmology;  Laterality: Left;  CDE:5.80   COLONOSCOPY  08/22/09   Fields-(Tubular Adenoma)3-mm transverse polyp/4-mm polyp otherwise noraml/small internal hemorrhoids   COLONOSCOPY [name] 04/14/2014   DOQ:dfjoo internal hemorrhids/normal mocsa in the terminal iluem/left colonis redundant   COLONOSCOPY W/ POLYPECTOMY  2011   ENDARTERECTOMY FEMORAL Left 08/16/2017   Procedure: ENDARTERECTOMY LEFT PROFUNDA FEMORAL;  Surgeon: Serene Gaile ORN, MD;  Location[facility] OR;  Service: Vascular;  Laterality: Left;   ENDARTERECTOMY FEMORAL Right 03/31/2020   Procedure: RIGHT FEMORAL ENDARTERECTOMY;  Surgeon: Serene Gaile ORN, MD;  Location: [facility] OR;  Service: Vascular;  Laterality: Right;   EP IMPLANTABLE DEVICE [name] 08/19/2015   Procedure: BiV ICD Insertion CRT-D;  Surgeon: Danelle ORN Birmingham, MD;  Location: [facility] INVASIVE CV LAB;  Service: Cardiovascular;  Laterality: [name];   ESOPHAGOGASTRODUODENOSCOPY [name] 03/05/2014   SLF: 1. Stricture at the gastroesophagael junction 2. Small hiatal hernia 3. Moderate non-erosive gastritis and duodentitis. 4. No surce for Melena identified.    FEMORAL-POPLITEAL BYPASS GRAFT Left 08/16/2017   Procedure: LEFT  FEMORAL-POPLITEAL ARTERY  BYPASS GRAFT;  Surgeon: Serene Gaile ORN, MD;  [facility];  Service: Vascular;  Laterality: Left;   GIVENS CAPSULE STUDY [name] 03/30/2014   Procedure: GIVENS CAPSULE STUDY;  Surgeon: Margo LITTIE Haddock, MD;  [facility];  Service: Endoscopy;  Laterality: [name];  7:30   GROIN DEBRIDEMENT Right 04/07/2020   Procedure: INCISION AND DRAINAGE OF RIGHT GROIN;   Surgeon: Serene Gaile ORN, MD;  [facility];  Service: Vascular;  Laterality: Right;   I & D EXTREMITY Left 09/03/2017   Procedure: IRRIGATION AND DEBRIDEMENT EXTREMITY LEFT THIGH SEROMA;  Surgeon: Laurence Redell LITTIE, MD;  [facility];  Service: Vascular;  Laterality: Left;   INSERT / REPLACE / REMOVE PACEMAKER     INSERTION OF ILIAC STENT  03/31/2020   Procedure: INSERTION OF RIGHT ILIAC ARTERY STENT AND RIGHT SUPERFICIAL FEMORAL ARTERY STENT;  Surgeon: Serene Gaile ORN, MD;  [facility];  Service: Vascular;;   IR ANGIO INTRA EXTRACRAN SEL COM CAROTID INNOMINATE BILAT MOD SED  07/16/2017   IR ANGIO INTRA EXTRACRAN SEL COM CAROTID INNOMINATE BILAT MOD SED  06/12/2019   IR ANGIO INTRA EXTRACRAN SEL COM CAROTID INNOMINATE BILAT MOD SED  01/17/2021   IR ANGIO VERTEBRAL SEL SUBCLAVIAN INNOMINATE BILAT MOD SED  07/16/2017   IR ANGIO VERTEBRAL SEL SUBCLAVIAN INNOMINATE BILAT MOD SED  06/12/2019   IR ANGIO VERTEBRAL SEL SUBCLAVIAN INNOMINATE BILAT MOD SED  01/17/2021   IR RADIOLOGIST EVAL & MGMT  04/01/2017   IR RADIOLOGIST EVAL & MGMT  08/02/2017   IR TRANSCATH EXCRAN VERT OR CAR A STENT  07/22/2019   IR US  GUIDE VASC ACCESS RIGHT  06/12/2019   IR US  GUIDE VASC ACCESS RIGHT  01/17/2021   JOINT REPLACEMENT Right    Total Shoulder Replacement    LOWER EXTREMITY ANGIOGRAPHY Bilateral 06/25/2017   Procedure: Lower Extremity Angiography;  Surgeon: Serene Gaile ORN, MD;  [facility];  Service: Cardiovascular;  Laterality: Bilateral;   LUMBAR FUSION  2010   PATCH ANGIOPLASTY Right 03/31/2020   Procedure: PATCH ANGIOPLASTY USING XENOSURE BIOLOGIC PATCH 1cm x 14cm;  Surgeon: Serene Gaile ORN, MD;  Location: [facility] OR;  Service: Vascular;  Laterality: Right;   PERIPHERAL VASCULAR ATHERECTOMY Right 04/22/2018   Procedure: PERIPHERAL VASCULAR ATHERECTOMY;  Surgeon: Serene Gaile ORN, MD;  [facility];  Service: Cardiovascular;  Laterality: Right;  right superficial femoral   PERIPHERAL  VASCULAR BALLOON ANGIOPLASTY Right 04/22/2018   Procedure: PERIPHERAL VASCULAR BALLOON ANGIOPLASTY;  Surgeon: Serene Gaile ORN, MD;  [facility];  Service: Cardiovascular;  Laterality: Right;  external iliac   PERIPHERAL VASCULAR BALLOON ANGIOPLASTY Left 03/08/2020   Procedure: PERIPHERAL VASCULAR BALLOON ANGIOPLASTY;  Surgeon: Serene Gaile ORN, MD;  [facility];  Service: Cardiovascular;  Laterality: Left;  Common Femoral   PERIPHERAL VASCULAR BALLOON ANGIOPLASTY Left 08/23/2020   Procedure: PERIPHERAL VASCULAR BALLOON ANGIOPLASTY;  Surgeon: Serene Gaile ORN,  MD;  [facility];  Service: Cardiovascular;  Laterality: Left;  common femoral   PERIPHERAL VASCULAR BALLOON ANGIOPLASTY Left 10/31/2021   Procedure: PERIPHERAL VASCULAR BALLOON ANGIOPLASTY;  Surgeon: Serene Gaile ORN, MD;  [facility];  Service: Cardiovascular;  Laterality: Left;  external iliac   PERIPHERAL VASCULAR INTERVENTION Right 03/08/2020   Procedure: PERIPHERAL VASCULAR INTERVENTION;  Surgeon: Serene Gaile ORN, MD;  [facility];  Service: Cardiovascular;  Laterality: Right;  SFA   PERIPHERAL VASCULAR INTERVENTION Right 08/23/2020   Procedure: PERIPHERAL VASCULAR INTERVENTION;  Surgeon: Serene Gaile ORN, MD;  [facility];  Service: Cardiovascular;  Laterality: Right;  external iliac   PERIPHERAL VASCULAR INTERVENTION Right 10/31/2021   Procedure: PERIPHERAL VASCULAR INTERVENTION;  Surgeon: Serene Gaile ORN, MD;  [facility];  Service: Cardiovascular;  Laterality: Right;  superficial femoral artery   PERIPHERAL VASCULAR INTERVENTION Left 12/19/2021   Procedure: PERIPHERAL VASCULAR INTERVENTION;  Surgeon: Serene Gaile ORN, MD;  [facility];  Service: Cardiovascular;  Laterality: Left;  stent lt ext iliac / pta common femoral   PERIPHERAL VASCULAR INTERVENTION  02/19/2023   Procedure: PERIPHERAL VASCULAR  INTERVENTION;  Surgeon: Serene Gaile ORN, MD;  [facility];  Service: Cardiovascular;;   RADIOLOGY WITH ANESTHESIA [name] 07/01/2019   Procedure: STENTING;  Surgeon: Dolphus Carrion, MD;  [facility];  Service: Radiology;  Laterality: [name];   RADIOLOGY WITH ANESTHESIA [name] 07/22/2019   Procedure: STENTING;  Surgeon: Dolphus Carrion, MD;  [facility];  Service: Radiology;  Laterality: [name];   TOTAL SHOULDER ARTHROPLASTY Right 2011   Dr. Dozier COWBOY NERVE TRANSPOSITION Left 02/02/2016   Procedure: LEFT IN-SITU DECOMPRESSION ULNAR NERVE ;  Surgeon: Arley Curia, MD;  Location: [facility];  Service: Orthopedics;  Laterality: Left;   ULNAR TUNNEL RELEASE Left 02/02/2016   Procedure: LEFT CUBITAL TUNNEL RELEASE;  Surgeon: Arley Curia, MD;  Location: [facility];  Service: Orthopedics;  Laterality: Left;   VASECTOMY  1971   VEIN HARVEST Left 08/16/2017   Procedure: USING NON REVERSE LEFT GREATER SAPHENOUS VEIN HARVEST;  Surgeon: Serene Gaile ORN, MD;  [facility];  Service: Vascular;  Laterality: Left;    Family History  Problem Relation Age of Onset   Coronary artery disease Mother    Diabetes Mother    Heart disease Mother        Before age 8 - 5 Bypasses   Hypertension Mother    Heart attack Mother        3-4 Heart attacks   Alzheimer's disease Father    Diabetes Father    Heart attack Sister    Cancer Brother        Prostate   Hyperlipidemia Son    Prostate cancer Brother    Alzheimer's disease Sister    Colon cancer Neg Hx     Social History Social History   Tobacco Use   Smoking status: Every Day    Current packs/day: 1.00    Average packs/day: 1 pack/day for 66.8 years (66.8 ttl pk-yrs)    Types: Cigarettes    Start date: 08/20/1957    Passive exposure: Current   Smokeless tobacco: Never  Vaping Use   Vaping status: Former   Start date: 09/10/2016   Quit date: 10/11/2016  Substance Use Topics   Alcohol use: No     Alcohol/week: 0.0 standard drinks of alcohol  Comment: quit in July 2015   Drug use: No    Current Outpatient Medications  Medication Sig Dispense Refill   aspirin  EC 81 MG tablet Take 81 mg by mouth every evening.      atorvastatin  (LIPITOR ) 20 MG tablet Take 20 mg by mouth every evening.     BRILINTA  90 MG TABS tablet Take 90 mg by mouth 2 (two) times daily.     carvedilol  (COREG ) 6.25 MG tablet TAKE 1 TABLET TWICE A DAY WITH MEALS (DOSE CHANGE, NEW PRESCRIPTION) 180 tablet 2   cetirizine (ZYRTEC) 10 MG tablet Take 10 mg by mouth daily.     Cobalamin Combinations (NEURIVA PLUS) CAPS Take 1 capsule by mouth daily.     dextromethorphan -guaiFENesin  (MUCINEX  DM) 30-600 MG 12hr tablet Take 1 tablet by mouth 2 (two) times daily.     gabapentin  (NEURONTIN ) 300 MG capsule Take 300 mg by mouth 2 (two) times daily.      HYDROcodone -acetaminophen  (NORCO/VICODIN) 5-325 MG tablet Take 1-2 tablets by mouth See admin instructions. Take 2 tablets in the morning and evening, may take 1 tablet at lunch as needed for pain     Magnesium  250 MG TABS Take 250 mg by mouth every evening.     Multiple Vitamins-Minerals (CENTRUM SILVER PO) Take 1 tablet by mouth daily. 50+     Naphazoline HCl (CLEAR EYES OP) Place 1 drop into both eyes daily as needed (dry eyes).     Omega-3 Fatty Acids (FISH OIL ) 1000 MG CAPS Take 1,000 mg by mouth every evening.     sacubitril -valsartan  (ENTRESTO ) 97-103 MG TAKE 1 TABLET TWICE A DAY (OVERDUE FOR APPOINTMENT) 60 tablet 0   Salicylic Acid (GOLD BOND PSORIASIS RELIEF) 3 % CREA Apply 1 application topically daily as needed (psoriasis).     silodosin (RAPAFLO) 8 MG CAPS capsule Take 8 mg by mouth daily with breakfast.     SUPER B COMPLEX/C PO Take 1 tablet by mouth every evening.     tamsulosin  (FLOMAX ) 0.4 MG CAPS capsule Take 0.4 mg by mouth daily after supper.     traMADol (ULTRAM) 50 MG tablet Take 50 mg by mouth 2 (two) times daily as needed for moderate pain (pain score 4-6).      No current facility-administered medications for this visit.    No Known Allergies  Review of Systems  Constitutional:  Negative for activity change, appetite change, fatigue and unexpected weight change.  HENT:  Negative for trouble swallowing and voice change.   Eyes:  Negative for visual disturbance.  Respiratory:  Positive for shortness of breath. Negative for cough and wheezing.        Has shortness of breath when he first lies down and then resolves in less than a minute  Cardiovascular:  Negative for chest pain, palpitations and leg swelling.  Genitourinary:  Negative for difficulty urinating and dysuria.  Neurological:  Negative for seizures and weakness.  Hematological:  Negative for adenopathy. Bruises/bleeds easily.  All other systems reviewed and are negative.   BP (!) 161/74 (BP Location: Right Arm, Patient Position: Sitting, Cuff Size: Normal)   Pulse 70   Resp 20   Ht 5' 3 (1.6 m)   Wt 143 lb (64.9 kg)   SpO2 96% Comment: RA  BMI 25.33 kg/m  Physical Exam Vitals reviewed.  Constitutional:      General: He is not in acute distress.    Appearance: Normal appearance.  HENT:     Head: Normocephalic and atraumatic.  Eyes:     General: No scleral icterus.    Extraocular Movements: Extraocular movements intact.  Cardiovascular:     Rate and Rhythm: Normal rate and regular rhythm.     Heart sounds: Normal heart sounds. No murmur heard.    No friction rub. No gallop.  Pulmonary:     Effort: Pulmonary effort is normal. No respiratory distress.     Breath sounds: Wheezing (Faint expiratory wheeze bilaterally) present.  Abdominal:     General: There is no distension.     Palpations: Abdomen is soft.     Tenderness: There is no abdominal tenderness.  Lymphadenopathy:     Cervical: No cervical adenopathy.  Skin:    General: Skin is warm and dry.     Comments: No clubbing or cyanosis  Neurological:     General: No focal deficit present.     Mental Status:  He is alert and oriented to person, place, and time.     Cranial Nerves: No cranial nerve deficit.     Motor: No weakness.      Diagnostic Tests: CT CHEST WITHOUT CONTRAST LOW-DOSE FOR LUNG CANCER SCREENING   TECHNIQUE: Multidetector CT imaging of the chest was performed following the standard protocol without IV contrast.   RADIATION DOSE REDUCTION: This exam was performed according to the departmental dose-optimization program which includes automated exposure control, adjustment of the mA and/or kV according to patient size and/or use of iterative reconstruction technique.   COMPARISON:  None Available.   FINDINGS: Cardiovascular: The heart size is normal. No substantial pericardial effusion. Coronary artery calcification is evident. Moderate atherosclerotic calcification is noted in the wall of the thoracic aorta. Status post permanent pacemaker placement.   Mediastinum/Nodes: No mediastinal lymphadenopathy. No evidence for gross hilar lymphadenopathy although assessment is limited by the lack of intravenous contrast on the current study. The esophagus has normal imaging features. There is no axillary lymphadenopathy.   Lungs/Pleura: Advanced changes of centrilobular and paraseptal emphysema. Biapical pleuroparenchymal scarring evident. There is a dominant 3.1 cm irregular spiculated mass in the right lower lobe (image 227). Additional scattered bilateral pulmonary nodules range in size from 5 mm to a 9.2 mm nodule in the D right posterior costophrenic sulcus (251). No pleural effusion.   Upper Abdomen: Visualized portion of the upper abdomen shows no acute findings.   Musculoskeletal: No worrisome lytic or sclerotic osseous abnormality. Superior endplate compression deformity seen at T11 and L1.   IMPRESSION: 1. Lung-RADS 4B, suspicious. Additional imaging evaluation or consultation with Pulmonology or Thoracic Surgery recommended. 2. Dominant 3.1 cm irregular  spiculated mass in the right lower lobe. 3. Additional scattered bilateral pulmonary nodules range in size from 5 mm to a 9.2 mm. These may potentially be infectious/inflammatory but metastatic disease is not excluded. 4. Aortic Atherosclerosis (ICD10-I70.0) and Emphysema (ICD10-J43.9).   These results will be called to the ordering clinician or representative by the Radiologist Assistant, and communication documented in the PACS or [employer].     Electronically Signed   By: Camellia Candle M.D.   On: 05/01/2024 08:01   I personally reviewed the CT images.  3.1 cm spiculated mass in right lower lobe.  Extensive emphysema.  Severe coronary and aortic atherosclerosis.  Porcelain ascending aorta.  ICD.  Impression: Roy [name] is a 78 year old man with an extensive past medical history including tobacco abuse, emphysema, MI, left bundle branch block, congestive heart failure, AICD, ethanol abuse and withdrawal, cirrhosis, extensive PAD, PAD, hypertension, hyperlipidemia, and thrombocytopenia.  Found to have a right lower lobe lung nodule on a low-dose CT for lung cancer screening.  Right lower lobe lung nodule-3.1 cm spiculated nodule.  Highly suspicious for new primary bronchogenic carcinoma.  Has to be considered that unless can be proven otherwise.  Granulomatous disease is also on the differential but is far less likely.  This almost certainly is a cancer.  He needs a PET/CT to guide our initial diagnostic workup.  Also needs pulmonary function testing to ensure that he has adequate pulmonary reserve for resection.  He did extremely well with a 6-minute walk test so I do not think that will be an issue.  The results of the PET/CT will guide our next step whether that is to do a navigational bronchoscopy+/- EBUS or consider going directly to surgery.  We discussed the potential treatment options briefly.  He understands that the treatment of choice would be surgical resection.   Radiation with or without chemotherapy would be a secondary choice based on what we know so far.  That could change based on results of PET/CT.  Tobacco abuse-strongly encouraged him to quit smoking.  I am not optimistic he will do so.  Cardiac-history of MI in 2015.  Has an ICD in place.  Recently had a generator changed.  Not having any anginal symptoms and has good exercise tolerance.  Will check with cardiology to see if he needs any additional testing prior to possible surgery.  History of ethanol use-quit in 2015.  History of cirrhosis, no recent labs.  No ascites or jaundice on exam.  PAD-extensive history with numerous vascular procedures.  No current issues.  He is on Brilinta .  Plan: PET/CT to guide initial diagnostic workup Formal pulmonary function testing with and without bronchodilators Return to office scheduled biopsy and possible surgery.  [name], MD [facility] [phone]   Branch, Dorn FALCON, MD  Millers Elspeth JAYSON, MD Marcey, He has done well since his MI in 2015. His echo last year showed normal LV function, has not had any significant symptoms since 2015 event. If good exercise tolerance and no symptoms on your evaluation would be ok to proceed with surgery from cardiac standpoint. He is on long term brillinta for his intracranial carotid stent that has been managed by Dr Dolphus. For clearance purposes you may consider touching base with him about holding the brillinta for the procedure.     J [employer]

## 2024-06-24 ENCOUNTER — Other Ambulatory Visit: Payer: Self-pay | Admitting: Thoracic Surgery (Cardiothoracic Vascular Surgery)

## 2024-06-24 DIAGNOSIS — R918 Other nonspecific abnormal finding of lung field: Secondary | ICD-10-CM

## 2024-06-25 ENCOUNTER — Encounter: Payer: Self-pay | Admitting: Nurse Practitioner

## 2024-06-25 ENCOUNTER — Ambulatory Visit: Payer: Medicare Other | Attending: Nurse Practitioner | Admitting: Nurse Practitioner

## 2024-06-25 ENCOUNTER — Encounter: Payer: Self-pay | Admitting: Internal Medicine

## 2024-06-25 VITALS — BP 110/60 | HR 61 | Ht 63.0 in | Wt 145.4 lb

## 2024-06-25 DIAGNOSIS — I739 Peripheral vascular disease, unspecified: Secondary | ICD-10-CM | POA: Diagnosis not present

## 2024-06-25 DIAGNOSIS — I10 Essential (primary) hypertension: Secondary | ICD-10-CM | POA: Diagnosis not present

## 2024-06-25 DIAGNOSIS — I2510 Atherosclerotic heart disease of native coronary artery without angina pectoris: Secondary | ICD-10-CM | POA: Insufficient documentation

## 2024-06-25 DIAGNOSIS — E785 Hyperlipidemia, unspecified: Secondary | ICD-10-CM | POA: Diagnosis not present

## 2024-06-25 DIAGNOSIS — I5032 Chronic diastolic (congestive) heart failure: Secondary | ICD-10-CM | POA: Diagnosis not present

## 2024-06-25 DIAGNOSIS — Z95810 Presence of automatic (implantable) cardiac defibrillator: Secondary | ICD-10-CM | POA: Insufficient documentation

## 2024-06-25 DIAGNOSIS — I77819 Aortic ectasia, unspecified site: Secondary | ICD-10-CM | POA: Insufficient documentation

## 2024-06-25 DIAGNOSIS — Z720 Tobacco use: Secondary | ICD-10-CM | POA: Insufficient documentation

## 2024-06-25 DIAGNOSIS — R0989 Other specified symptoms and signs involving the circulatory and respiratory systems: Secondary | ICD-10-CM | POA: Diagnosis not present

## 2024-06-25 NOTE — Progress Notes (Addendum)
 Cardiology Office Note:    Date: 06/25/2024 ID:  Roy [name], DOB [birth year], MRN [id number] PCP:  Shona Norleen PEDLAR, MD [facility] Providers Cardiologist:  Alvan Carrier, MD Electrophysiologist:  Danelle Birmingham, MD  Referring MD: Shona Norleen PEDLAR, MD   CC: Here for follow-up  History of Present Illness:    Roy [name] is a very pleasant 78 y.o. male with a PMH of CAD, s/p NSTEMI, HFimpEF, s/p BiV ICD, HLD, HTN, cerebrovascular disease/TIA, s/p hx of ICA stent, PAD, s/p recent SFA stenting on 02/19/2023, tobacco abuse (has been smoking since 78 years old), former alcoholic, aortic dilatation, and COPD, who presents today for follow-up. Followed by [facility] for hx of PAD.   History of NSTEMI 2015 in the setting of respiratory failure and pneumonia.  EF at that time was 20%.  Cardiac catheterization was not pursued due to renal dysfunction and AMS.  Subsequent stress imaging supported scar.  Has been on DAPT for history of ICA stenting due to cerebrovascular disease.  In 2018 EF improved to 50 to 55% after ICD was placed, no wall motion abnormalities.  Underwent bilateral lower extremity angiogram with right SFA stenting on February 19, 2023 by Dr. Serene. During angiogram, no significant stenosis was found along iliacs, aorta, or left femoropopliteal bypass.  I last saw patient on June 18, 2023.  Was doing well at the time.  Was continuing to smoke.  Home blood pressures are well-controlled.  12/27/2023 - Today he presents for follow-up. Tells me he underwent ICD generator change-out on 11/11/2023. Doing well. Denies any chest pain, shortness of breath, palpitations, syncope, presyncope, dizziness, orthopnea, PND, swelling or significant weight changes, acute bleeding, or claudication. Continues to smoke. Shows me BP log that overall shows well controlled blood pressures. Weights are stable.  06/25/2024 -  Here for follow-up. Tells me he has been diagnosed with lung cancer. He is very  motivated to quit smoking. Scheduled for colonoscopy next week. Overall doing well. Denies any chest pain, shortness of breath, palpitations, syncope, presyncope, dizziness, orthopnea, PND, swelling or significant weight changes, acute bleeding, or claudication.  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  ROS:    Please see the history of present illness.    All other systems reviewed and are negative.  EKGs/Labs/Other Studies Reviewed:    The following studies were reviewed today:   EKG:  EKG is not ordered today.  Echocardiogram 03/2023: 1. Left ventricular ejection fraction, by estimation, is 55 to 60%. The  left ventricle has normal function. The left ventricle has no regional  wall motion abnormalities. Left ventricular diastolic parameters are  consistent with Grade I diastolic  dysfunction (impaired relaxation). The average left ventricular global  longitudinal strain is -21.6 %. The global longitudinal strain is normal.   2. Right ventricular systolic function was not well visualized. The right  ventricular size is normal. There is normal pulmonary artery systolic  pressure.   3. The mitral valve is normal in structure. Trivial mitral valve  regurgitation. No evidence of mitral stenosis.   4. The aortic valve was not well visualized. Aortic valve regurgitation  is not visualized. No aortic stenosis is present.   5. Aortic dilatation noted. There is borderline dilatation of the aortic  root, measuring 36 mm.   6. The inferior vena cava is normal in size with greater than 50%  respiratory variability, suggesting right atrial pressure of 3 mmHg.   Comparison(s): Echocardiogram done 08/11/17 showed an EF of 50-55%.  Myoview  on 08/30/2014: IMPRESSION:  1. Large degree of inferior myocardial scar. No evidence of  ischemia. Mild LV dilatation.   2.  Inferior wall akinesis.   3. Left ventricular ejection fraction 18%   4. High-risk stress test findings* based on severely depressed EF   and degree of myocardial scar.  Physical Exam:    VS:  BP 110/60   Pulse 61   Ht 5' 3 (1.6 m)   Wt 145 lb 6.4 oz (66 kg)   SpO2 98%   BMI 25.76 kg/m     Wt Readings from Last 3 Encounters:  06/25/24 145 lb 6.4 oz (66 kg)  06/23/24 143 lb (64.9 kg)  06/18/24 143 lb 12.8 oz (65.2 kg)     GEN: Well nourished, well developed in no acute distress HEENT: Normal NECK: No JVD; right carotid bruit, no left carotid bruit CARDIAC: S1/S2, RRR, no murmurs, rubs, gallops; 2+ pulses RESPIRATORY:  Clear and diminished to auscultation without rales, wheezing or rhonchi  MUSCULOSKELETAL:  No edema; No deformity  SKIN: Warm and dry NEUROLOGIC:  Alert and oriented x 3 PSYCHIATRIC:  Normal affect   ASSESSMENT & PLAN:    In order of problems listed above:  CAD, s/p NSTEMI Stable with no anginal symptoms. No indication for ischemic evaluation. Continue current medication regimen. Heart healthy diet and regular cardiovascular exercise encouraged. Smoking cessation encouraged and discussed - see below.   HLD Most recent LDL 53 05/2024. Being managed by PCP.  Continue current medication regimen. Heart healthy diet and regular cardiovascular exercise encouraged.   PAD, right carotid bruit Longstanding history. He underwent bilateral lower extremity angiogram and is s/p right SFA stenting on February 19, 2023 by Dr. Serene. Right carotid bruit noted on exam. Will update carotid duplex at this time. Continue current medication regimen.  Continue to follow-up with Dr. Serene.   HFimpEF, s/p BiV ICD, ICM Stage C, NYHA class I-II symptoms. TTE 03/2023 showed EF 55-60%. Euvolemic and well compensated on exam.  Continue current medication regimen. Low sodium diet, fluid restriction <2L, and daily weights encouraged. Educated to contact our office for weight gain of 2 lbs overnight or 5 lbs in one week. Normal device function seen during recent device check. Continue to follow up with EP.   5. HTN Blood  pressure stable. Discussed to monitor BP at home at least 2 hours after medications and sitting for 5-10 minutes.  No medication changes at this time. Low salt, heart healthy diet and regular cardiovascular exercise encouraged.   6.  Tobacco abuse Smoking cessation encouraged and discussed.  He verbalized understanding.  7. Aortic dilatation Borderline dilatation of aortic root measuring 36 mm noted on echocardiogram 03/2023.  Will update Echo at this time.  Care and ED precautions discussed.  8. Pre-operative cardiovascular exam Mr. Carlucci's perioperative risk of a major cardiac event is 6.6% according to the Revised Cardiac Risk Index (RCRI).  Therefore, he is at high risk for perioperative complications.   His functional capacity is excellent at 6.61 METs according to the Duke Activity Status Index (DASI). Recommendations: According to ACC/AHA guidelines, no further cardiovascular testing needed.  The patient may proceed to surgery at acceptable risk.   Antiplatelet and/or Anticoagulation Recommendations: Consulted DOD (Dr. Mallipeddi) via secure chat regarding holding time of Brilinta . Pt is on Brilinta  d/t hx of stenting r/t to his PAD and Dr. Mallipeddi recommended Vascular Surgery would need to provide clearance. Okay to continue ASA perioperatively.   Follow-up with Dr. Dorn Ross or  APP in 6 months or sooner if anything changes.  Medication Adjustments/Labs and Tests Ordered: Current medicines are reviewed at length with the patient today.  Concerns regarding medicines are outlined above.  Orders Placed This Encounter  Procedures   ECHOCARDIOGRAM COMPLETE   VAS US  CAROTID   No orders of the defined types were placed in this encounter.   Patient Instructions  Medication Instructions:  Your physician recommends that you continue on your current medications as directed. Please refer to the Current Medication list given to you today.  Labwork: None    Testing/Procedures: Your physician has requested that you have an echocardiogram. Echocardiography is a painless test that uses sound waves to create images of your heart. It provides your doctor with information about the size and shape of your heart and how well your heart's chambers and valves are working. This procedure takes approximately one hour. There are no restrictions for this procedure. Please do NOT wear cologne, perfume, aftershave, or lotions (deodorant is allowed). Please arrive 15 minutes prior to your appointment time.  Please note: We ask at that you not bring children with you during ultrasound (echo/ vascular) testing. Due to room size and safety concerns, children are not allowed in the ultrasound rooms during exams. Our front office staff cannot provide observation of children in our lobby area while testing is being conducted. An adult accompanying a patient to their appointment will only be allowed in the ultrasound room at the discretion of the ultrasound technician under special circumstances. We apologize for any inconvenience. Your physician has requested that you have a carotid duplex. This test is an ultrasound of the carotid arteries in your neck. It looks at blood flow through these arteries that supply the brain with blood. Allow one hour for this exam. There are no restrictions or special instructions.  Follow-Up: Your physician recommends that you schedule a follow-up appointment in: 6 Months   Any Other Special Instructions Will Be Listed Below (If Applicable).  If you need a refill on your cardiac medications before your next appointment, please call your pharmacy.   Signed, Almarie Crate, NP

## 2024-06-25 NOTE — Progress Notes (Signed)
 PERIOPERATIVE PRESCRIPTION FOR IMPLANTED CARDIAC DEVICE PROGRAMMING  Patient Information: Name:  Roy [name]  DOB:  01-15-1946  MRN:  [id number]  Planned Procedure:  colonoscopy Surgeon:  Dr. Lamar Hollingshead Date of Procedure: 07/01/2024 Position during surgery:  left lateral Cautery will be used.  Device Information:  Clinic EP Physician:  Danelle Birmingham, MD   Device Type:  Pacemaker and Defibrillator Manufacturer and Phone #:  [employer]: [phone] Pacemaker Dependent?:  No. Date of Last Device Check:  05/12/2024 Normal Device Function?:  Yes.    Electrophysiologist's Recommendations:  Have magnet available. Provide continuous ECG monitoring when magnet is used or reprogramming is to be performed.  Procedure should not interfere with device function.  No device programming or magnet placement needed.  Per Device Clinic Standing Orders, [name], RN  6:46 AM 06/25/2024

## 2024-06-25 NOTE — Patient Instructions (Addendum)
 Medication Instructions:  Your physician recommends that you continue on your current medications as directed. Please refer to the Current Medication list given to you today.  Labwork: None   Testing/Procedures: Your physician has requested that you have an echocardiogram. Echocardiography is a painless test that uses sound waves to create images of your heart. It provides your doctor with information about the size and shape of your heart and how well your heart's chambers and valves are working. This procedure takes approximately one hour. There are no restrictions for this procedure. Please do NOT wear cologne, perfume, aftershave, or lotions (deodorant is allowed). Please arrive 15 minutes prior to your appointment time.  Please note: We ask at that you not bring children with you during ultrasound (echo/ vascular) testing. Due to room size and safety concerns, children are not allowed in the ultrasound rooms during exams. Our front office staff cannot provide observation of children in our lobby area while testing is being conducted. An adult accompanying a patient to their appointment will only be allowed in the ultrasound room at the discretion of the ultrasound technician under special circumstances. We apologize for any inconvenience. Your physician has requested that you have a carotid duplex. This test is an ultrasound of the carotid arteries in your neck. It looks at blood flow through these arteries that supply the brain with blood. Allow one hour for this exam. There are no restrictions or special instructions.  Follow-Up: Your physician recommends that you schedule a follow-up appointment in: 6 Months   Any Other Special Instructions Will Be Listed Below (If Applicable).  If you need a refill on your cardiac medications before your next appointment, please call your pharmacy.

## 2024-06-25 NOTE — Progress Notes (Signed)
 I have routed my note to [facility] and have requested that they provide clearance to requesting party for upcoming colonoscopy.    Almarie Crate, NP

## 2024-06-29 ENCOUNTER — Encounter (HOSPITAL_COMMUNITY)
Admission: RE | Admit: 2024-06-29 | Discharge: 2024-06-29 | Disposition: A | Discharge status: Home / Self Care | Source: Ambulatory Visit | Attending: Internal Medicine | Admitting: Internal Medicine

## 2024-06-29 ENCOUNTER — Encounter (HOSPITAL_COMMUNITY): Payer: Self-pay

## 2024-06-29 HISTORY — DX: Malignant neoplasm of unspecified part of unspecified bronchus or lung: C34.90

## 2024-06-29 NOTE — Patient Instructions (Signed)
 Roy [name]  06/29/2024     @PREFPERIOPPHARMACY @   Your procedure is scheduled on Wednesday, 8/6.  Report to [name] at 0715 A.M.  Call this number if you have problems the morning of surgery:  [phone]  If you experience any cold or flu symptoms such as cough, fever, chills, shortness of breath, etc. between now and your scheduled surgery, please notify us  at the above number.   Remember:  Do not eat or drink after midnight.  You may drink clear liquids until 0515 .  Clear liquids allowed are:                    Water , Carbonated beverages (diabetics please choose diet or no sugar options), Clear Tea (No creamer, milk, or cream, including half & half and powdered creamer), and Black Coffee Only (No creamer, milk or cream, including half & half and powdered creamer)    Take these medicines the morning of surgery with A SIP OF WATER  carvedilol , gabapentin , hydrocodone  or tramadol, entresto      Do not wear jewelry, make-up or nail polish, including gel polish,  artificial nails, or any other type of covering on natural nails (fingers and  toes).  Do not wear lotions, powders, or perfumes, or deodorant.  Do not shave 48 hours prior to surgery.  Men may shave face and neck.  Do not bring valuables to the hospital.  [facility] is not responsible for any belongings or valuables.  Contacts, dentures or bridgework may not be worn into surgery.  Leave your suitcase in the car.  After surgery it may be brought to your room.  For patients admitted to the hospital, discharge time will be determined by your treatment team.  Patients discharged the day of surgery will not be allowed to drive home.   Name and phone number of your driver:   family Special instructions:  Please follow diet and prep instructions sent to you from Dr Ivonne office.  Please read over the following fact sheets that you were given. Anesthesia Post-op Instructions and Care and Recovery After  Surgery    Colonoscopy, Adult, Care After The following information offers guidance on how to care for yourself after your procedure. Your health care provider may also give you more specific instructions. If you have problems or questions, contact your health care provider. What can I expect after the procedure? After the procedure, it is common to have: A small amount of blood in your stool for 24 hours after the procedure. Some gas. Mild cramping or bloating of your abdomen. Follow these instructions at home: Eating and drinking  Drink enough fluid to keep your urine pale yellow. Follow instructions from your health care provider about eating or drinking restrictions. Resume your normal diet as told by your health care provider. Avoid heavy or fried foods that are hard to digest. Activity Rest as told by your health care provider. Avoid sitting for a long time without moving. Get up to take short walks every 1-2 hours. This is important to improve blood flow and breathing. Ask for help if you feel weak or unsteady. Return to your normal activities as told by your health care provider. Ask your health care provider what activities are safe for you. Managing cramping and bloating  Try walking around when you have cramps or feel bloated. If directed, apply heat to your abdomen as told by your health care provider. Use the heat source that your health care  provider recommends, such as a moist heat pack or a heating pad. Place a towel between your skin and the heat source. Leave the heat on for 20-30 minutes. Remove the heat if your skin turns bright red. This is especially important if you are unable to feel pain, heat, or cold. You have a greater risk of getting burned. General instructions If you were given a sedative during the procedure, it can affect you for several hours. Do not drive or operate machinery until your health care provider says that it is safe. For the first 24 hours  after the procedure: Do not sign important documents. Do not drink alcohol. Do your regular daily activities at a slower pace than normal. Eat soft foods that are easy to digest. Take over-the-counter and prescription medicines only as told by your health care provider. Keep all follow-up visits. This is important. Contact a health care provider if: You have blood in your stool 2-3 days after the procedure. Get help right away if: You have more than a small spotting of blood in your stool. You have large blood clots in your stool. You have swelling of your abdomen. You have nausea or vomiting. You have a fever. You have increasing pain in your abdomen that is not relieved with medicine. These symptoms may be an emergency. Get help right away. Call 911. Do not wait to see if the symptoms will go away. Do not drive yourself to the hospital. Summary After the procedure, it is common to have a small amount of blood in your stool. You may also have mild cramping and bloating of your abdomen. If you were given a sedative during the procedure, it can affect you for several hours. Do not drive or operate machinery until your health care provider says that it is safe. Get help right away if you have a lot of blood in your stool, nausea or vomiting, a fever, or increased pain in your abdomen. This information is not intended to replace advice given to you by your health care provider. Make sure you discuss any questions you have with your health care provider. Document Revised: 12/25/2022 Document Reviewed: 07/05/2021 Elsevier Patient Education  2024 [employer]Colonoscopy, Adult A colonoscopy is a procedure to look at the entire large intestine. This procedure is done using a long, thin, flexible tube that has a camera on the end. You may have a colonoscopy: As a part of normal colorectal screening. If you have certain symptoms, such as: A low number of red blood cells in your blood  (anemia). Diarrhea that does not go away. Pain in your abdomen. Blood in your stool. A colonoscopy can help screen for and diagnose medical problems, including: An abnormal growth of cells or tissue (tumor). Abnormal growths within the lining of your intestine (polyps). Inflammation. Areas of bleeding. Tell your health care provider about: Any allergies you have. All medicines you are taking, including vitamins, herbs, eye drops, creams, and over-the-counter medicines. Any problems you or family members have had with anesthetic medicines. Any bleeding problems you have. Any surgeries you have had. Any medical conditions you have. Any problems you have had with having bowel movements. Whether you are pregnant or may be pregnant. What are the risks? Generally, this is a safe procedure. However, problems may occur, including: Bleeding. Damage to your intestine. Allergic reactions to medicines given during the procedure. Infection. This is rare. What happens before the procedure? Eating and drinking restrictions Follow instructions from your health care provider  about eating or drinking restrictions, which may include: A few days before the procedure: Follow a low-fiber diet. Avoid nuts, seeds, dried fruit, raw fruits, and vegetables. 1-3 days before the procedure: Eat only gelatin dessert or ice pops. Drink only clear liquids, such as water , clear juice, clear broth or bouillon, black coffee or tea, or clear soft drinks or sports drinks. Avoid liquids that contain red or purple dye. The day of the procedure: Do not eat solid foods. You may continue to drink clear liquids until up to 2 hours before the procedure. Do not eat or drink anything starting 2 hours before the procedure, or within the time period that your health care provider recommends. Bowel prep If you were prescribed a bowel prep to take by mouth (orally) to clean out your colon: Take it as told by your health care  provider. Starting the day before your procedure, you will need to drink a large amount of liquid medicine. The liquid will cause you to have many bowel movements of loose stool until your stool becomes almost clear or light green. If your skin or the opening between the buttocks (anus) gets irritated from diarrhea, you may relieve the irritation using: Wipes with medicine in them, such as adult wet wipes with aloe and vitamin E. A product to soothe skin, such as petroleum jelly. If you vomit while drinking the bowel prep: Take a break for up to 60 minutes. Begin the bowel prep again. Call your health care provider if you keep vomiting or you cannot take the bowel prep without vomiting. To clean out your colon, you may also be given: Laxative medicines. These help you have a bowel movement. Instructions for enema use. An enema is liquid medicine injected into your rectum. Medicines Ask your health care provider about: Changing or stopping your regular medicines or supplements. This is especially important if you are taking iron supplements, diabetes medicines, or blood thinners. Taking medicines such as aspirin  and ibuprofen. These medicines can thin your blood. Do not take these medicines unless your health care provider tells you to take them. Taking over-the-counter medicines, vitamins, herbs, and supplements. General instructions Ask your health care provider what steps will be taken to help prevent infection. These may include washing skin with a germ-killing soap. If you will be going home right after the procedure, plan to have a responsible adult: Take you home from the hospital or clinic. You will not be allowed to drive. Care for you for the time you are told. What happens during the procedure?  An IV will be inserted into one of your veins. You will be given a medicine to make you fall asleep (general anesthetic). You will lie on your side with your knees bent. A lubricant will  be put on the tube. Then the tube will be: Inserted into your anus. Gently eased through all parts of your large intestine. Air will be sent into your colon to keep it open. This may cause some pressure or cramping. Images will be taken with the camera and will appear on a screen. A small tissue sample may be removed to be looked at under a microscope (biopsy). The tissue may be sent to a lab for testing if any signs of problems are found. If small polyps are found, they may be removed and checked for cancer cells. When the procedure is finished, the tube will be removed. The procedure may vary among health care providers and hospitals. What happens after the procedure?  Your blood pressure, heart rate, breathing rate, and blood oxygen level will be monitored until you leave the hospital or clinic. You may have a small amount of blood in your stool. You may pass gas and have mild cramping or bloating in your abdomen. This is caused by the air that was used to open your colon during the exam. If you were given a sedative during the procedure, it can affect you for several hours. Do not drive or operate machinery until your health care provider says that it is safe. It is up to you to get the results of your procedure. Ask your health care provider, or the department that is doing the procedure, when your results will be ready. Summary A colonoscopy is a procedure to look at the entire large intestine. Follow instructions from your health care provider about eating and drinking before the procedure. If you were prescribed an oral bowel prep to clean out your colon, take it as told by your health care provider. During the colonoscopy, a flexible tube with a camera on its end is inserted into the anus and then passed into all parts of the large intestine. This information is not intended to replace advice given to you by your health care provider. Make sure you discuss any questions you have with your  health care provider. Document Revised: 12/25/2022 Document Reviewed: 07/05/2021 Elsevier Patient Education  2024 [employer].

## 2024-07-01 ENCOUNTER — Ambulatory Visit (HOSPITAL_COMMUNITY)
Admission: RE | Admit: 2024-07-01 | Discharge: 2024-07-01 | Disposition: A | Discharge status: Home / Self Care | Attending: Internal Medicine | Admitting: Internal Medicine

## 2024-07-01 ENCOUNTER — Ambulatory Visit (HOSPITAL_COMMUNITY): Admitting: Anesthesiology

## 2024-07-01 ENCOUNTER — Encounter (HOSPITAL_COMMUNITY): Payer: Self-pay | Admitting: Internal Medicine

## 2024-07-01 ENCOUNTER — Other Ambulatory Visit: Payer: Self-pay

## 2024-07-01 ENCOUNTER — Encounter (HOSPITAL_COMMUNITY)
Admission: RE | Disposition: A | Discharge status: Home / Self Care | Payer: Self-pay | Source: Home / Self Care | Attending: Internal Medicine

## 2024-07-01 DIAGNOSIS — G709 Myoneural disorder, unspecified: Secondary | ICD-10-CM | POA: Diagnosis not present

## 2024-07-01 DIAGNOSIS — Z95810 Presence of automatic (implantable) cardiac defibrillator: Secondary | ICD-10-CM | POA: Insufficient documentation

## 2024-07-01 DIAGNOSIS — Z8673 Personal history of transient ischemic attack (TIA), and cerebral infarction without residual deficits: Secondary | ICD-10-CM | POA: Insufficient documentation

## 2024-07-01 DIAGNOSIS — I2510 Atherosclerotic heart disease of native coronary artery without angina pectoris: Secondary | ICD-10-CM

## 2024-07-01 DIAGNOSIS — M1990 Unspecified osteoarthritis, unspecified site: Secondary | ICD-10-CM | POA: Insufficient documentation

## 2024-07-01 DIAGNOSIS — J449 Chronic obstructive pulmonary disease, unspecified: Secondary | ICD-10-CM | POA: Insufficient documentation

## 2024-07-01 DIAGNOSIS — I110 Hypertensive heart disease with heart failure: Secondary | ICD-10-CM | POA: Diagnosis not present

## 2024-07-01 DIAGNOSIS — Z1211 Encounter for screening for malignant neoplasm of colon: Secondary | ICD-10-CM | POA: Insufficient documentation

## 2024-07-01 DIAGNOSIS — E785 Hyperlipidemia, unspecified: Secondary | ICD-10-CM | POA: Insufficient documentation

## 2024-07-01 DIAGNOSIS — I5022 Chronic systolic (congestive) heart failure: Secondary | ICD-10-CM | POA: Diagnosis not present

## 2024-07-01 DIAGNOSIS — D125 Benign neoplasm of sigmoid colon: Secondary | ICD-10-CM | POA: Insufficient documentation

## 2024-07-01 DIAGNOSIS — I739 Peripheral vascular disease, unspecified: Secondary | ICD-10-CM | POA: Insufficient documentation

## 2024-07-01 DIAGNOSIS — F17210 Nicotine dependence, cigarettes, uncomplicated: Secondary | ICD-10-CM | POA: Diagnosis not present

## 2024-07-01 DIAGNOSIS — K5730 Diverticulosis of large intestine without perforation or abscess without bleeding: Secondary | ICD-10-CM | POA: Diagnosis not present

## 2024-07-01 DIAGNOSIS — Z8349 Family history of other endocrine, nutritional and metabolic diseases: Secondary | ICD-10-CM | POA: Diagnosis not present

## 2024-07-01 DIAGNOSIS — Z8249 Family history of ischemic heart disease and other diseases of the circulatory system: Secondary | ICD-10-CM | POA: Insufficient documentation

## 2024-07-01 DIAGNOSIS — K635 Polyp of colon: Secondary | ICD-10-CM | POA: Diagnosis not present

## 2024-07-01 DIAGNOSIS — F17200 Nicotine dependence, unspecified, uncomplicated: Secondary | ICD-10-CM | POA: Diagnosis not present

## 2024-07-01 DIAGNOSIS — I252 Old myocardial infarction: Secondary | ICD-10-CM | POA: Diagnosis not present

## 2024-07-01 HISTORY — PX: COLONOSCOPY: SHX5424

## 2024-07-01 SURGERY — COLONOSCOPY
Anesthesia: General

## 2024-07-01 MED ORDER — PROPOFOL 500 MG/50ML IV EMUL
INTRAVENOUS | Status: DC | PRN
  Administered 2024-07-01: 20 mg via INTRAVENOUS
  Administered 2024-07-01: 100 ug/kg/min via INTRAVENOUS
  Administered 2024-07-01: 100 mg via INTRAVENOUS

## 2024-07-01 MED ORDER — LACTATED RINGERS IV SOLN: INTRAVENOUS | Status: DC

## 2024-07-01 NOTE — Transfer of Care (Signed)
 Immediate Anesthesia Transfer of Care Note  Patient: Roy [name]  Procedure(s) Performed: COLONOSCOPY  Patient Location: Short Stay  Anesthesia Type:General  Level of Consciousness: awake and patient cooperative  Airway & Oxygen Therapy: Patient Spontanous Breathing  Post-op Assessment: Report given to RN and Post -op Vital signs reviewed and stable  Post vital signs: Reviewed and stable  Last Vitals:  Vitals Value Taken Time  BP 87/42 07/01/24 10:24  Temp 36.6 C 07/01/24 10:24  Pulse 60 07/01/24 10:24  Resp 19 07/01/24 10:24  SpO2 100 % 07/01/24 10:24    Last Pain:  Vitals:   07/01/24 1024  TempSrc: Oral  PainSc: 0-No pain         Complications: No notable events documented.

## 2024-07-01 NOTE — Op Note (Signed)
 [facility] Patient Name: Roy [name] Procedure Date: 07/01/2024 9:02 AM MRN: [id number] Date of Birth: 1946-04-21 Attending MD: Lamar Ozell Hollingshead , MD, [id number] CSN: [id number] Age: 78 Admit Type: Outpatient Procedure:                Colonoscopy Indications:              Screening for colorectal malignant neoplasm Providers:                Lamar Ozell Hollingshead, MD, Leandrew Edelman RN, RN, [place]                            Wilson, Technician Referring MD:              Medicines:                Propofol  per Anesthesia Complications:            No immediate complications. Estimated Blood Loss:     Estimated blood loss was minimal. Procedure:                Pre-Anesthesia Assessment:                           - Prior to the procedure, a History and Physical                            was performed, and patient medications and                            allergies were reviewed. The patient's tolerance of                            previous anesthesia was also reviewed. The risks                            and benefits of the procedure and the sedation                            options and risks were discussed with the patient.                            All questions were answered, and informed consent                            was obtained. Prior Anticoagulants: The patient has                            taken no anticoagulant or antiplatelet agents. ASA                            Grade Assessment: III - A patient with severe                            systemic disease. After reviewing the risks and  benefits, the patient was deemed in satisfactory                            condition to undergo the procedure.                           After obtaining informed consent, the colonoscope                            was passed under direct vision. Throughout the                            procedure, the patient's blood pressure, pulse, and                             oxygen saturations were monitored continuously. The                            [id number]) scope was introduced through the                            anus and advanced to the the cecum, identified by                            appendiceal orifice and ileocecal valve. The                            colonoscopy was performed without difficulty. The                            colonoscopy was performed without difficulty. Scope In: 9:56:46 AM Scope Out: 10:16:53 AM Scope Withdrawal Time: 0 hours 7 minutes 48 seconds  Total Procedure Duration: 0 hours 20 minutes 7 seconds  Findings:      The perianal and digital rectal examinations were normal.      A 4 mm polyp was found in the sigmoid colon. The polyp was sessile. The       polyp was removed with a cold snare. Resection and retrieval were       complete. Estimated blood loss was minimal.      An 8 mm polyp was found in the sigmoid colon. The polyp was sessile. The       polyp was removed with a hot snare. Resection and retrieval were       complete. Estimated blood loss: none.      Scattered small-mouthed diverticula were found in the sigmoid colon.      The exam was otherwise without abnormality on direct and retroflexion       views. Impression:               - One 4 mm polyp in the sigmoid colon, removed with                            a cold snare. Resected and retrieved.                           -  One 8 mm polyp in the sigmoid colon, removed with                            a hot snare. Resected and retrieved.                           - Diverticulosis in the sigmoid colon.                           - The examination was otherwise normal on direct                            and retroflexion views. Moderate Sedation:      Moderate (conscious) sedation was personally administered by an       anesthesia professional. The following parameters were monitored: oxygen       saturation, heart rate, blood pressure, respiratory  rate, EKG, adequacy       of pulmonary ventilation, and response to care. Recommendation:           - Patient has a contact number available for                            emergencies. The signs and symptoms of potential                            delayed complications were discussed with the                            patient. Return to normal activities tomorrow.                            Written discharge instructions were provided to the                            patient.                           - Advance diet as tolerated.                           - Continue present medications.                           - Repeat colonoscopy date to be determined after                            pending pathology results are reviewed for                            surveillance.                           - Return to my office (date not yet determined). Procedure Code(s):        --- Professional ---  54614, Colonoscopy, flexible; with removal of                            tumor(s), polyp(s), or other lesion(s) by snare                            technique Diagnosis Code(s):        --- Professional ---                           Z12.11, Encounter for screening for malignant                            neoplasm of colon                           D12.5, Benign neoplasm of sigmoid colon                           K57.30, Diverticulosis of large intestine without                            perforation or abscess without bleeding CPT copyright 2022 American Medical Association. All rights reserved. The codes documented in this report are preliminary and upon coder review may  be revised to meet current compliance requirements. Lamar HERO. Marveline Profeta, MD Lamar Ozell Hollingshead, MD 07/01/2024 10:25:46 AM This report has been signed electronically. Number of Addenda: 0

## 2024-07-01 NOTE — Interval H&P Note (Signed)
 History and Physical Interval Note:  07/01/2024 9:37 AM  Roy [name]  has presented today for surgery, with the diagnosis of colon cancer screening.  The various methods of treatment have been discussed with the patient and family. After consideration of risks, benefits and other options for treatment, the patient has consented to  Procedure(s) with comments: COLONOSCOPY ([name]) - 900am, asa 3 as a surgical intervention.  The patient's history has been reviewed, patient examined, no change in status, stable for surgery.  I have reviewed the patient's chart and labs.  Questions were answered to the patient's satisfaction.     Lamar Hollingshead   discussed pros and cons of 1 more colonoscopy.  Patient would like to have 1 more screening colonoscopy he remains on Brilinta  per plan.  The risks, benefits, limitations, alternatives and imponderables have been reviewed with the patient. Questions have been answered. All parties are agreeable.

## 2024-07-01 NOTE — Anesthesia Procedure Notes (Signed)
 Date/Time: 07/01/2024 9:48 AM  Performed by: Barbarann Verneita RAMAN, CRNAPre-anesthesia Checklist: Patient identified, Emergency Drugs available, Suction available, Timeout performed and Patient being monitored Patient Re-evaluated:Patient Re-evaluated prior to induction Oxygen Delivery Method: Nasal Cannula

## 2024-07-01 NOTE — Discharge Instructions (Signed)
  Colonoscopy Discharge Instructions  Read the instructions outlined below and refer to this sheet in the next few weeks. These discharge instructions provide you with general information on caring for yourself after you leave the hospital. Your doctor may also give you specific instructions. While your treatment has been planned according to the most current medical practices available, unavoidable complications occasionally occur. If you have any problems or questions after discharge, call Dr. Shaaron at [phone]. ACTIVITY You may resume your regular activity, but move at a slower pace for the next 24 hours.  Take frequent rest periods for the next 24 hours.  Walking will help get rid of the air and reduce the bloated feeling in your belly (abdomen).  No driving for 24 hours (because of the medicine (anesthesia) used during the test).   Do not sign any important legal documents or operate any machinery for 24 hours (because of the anesthesia used during the test).  NUTRITION Drink plenty of fluids.  You may resume your normal diet as instructed by your doctor.  Begin with a light meal and progress to your normal diet. Heavy or fried foods are harder to digest and may make you feel sick to your stomach (nauseated).  Avoid alcoholic beverages for 24 hours or as instructed.  MEDICATIONS You may resume your normal medications unless your doctor tells you otherwise.  WHAT YOU CAN EXPECT TODAY Some feelings of bloating in the abdomen.  Passage of more gas than usual.  Spotting of blood in your stool or on the toilet paper.  IF YOU HAD POLYPS REMOVED DURING THE COLONOSCOPY: No aspirin  products for 7 days or as instructed.  No alcohol for 7 days or as instructed.  Eat a soft diet for the next 24 hours.  FINDING OUT THE RESULTS OF YOUR TEST Not all test results are available during your visit. If your test results are not back during the visit, make an appointment with your caregiver to find out the  results. Do not assume everything is normal if you have not heard from your caregiver or the medical facility. It is important for you to follow up on all of your test results.  SEEK IMMEDIATE MEDICAL ATTENTION IF: You have more than a spotting of blood in your stool.  Your belly is swollen (abdominal distention).  You are nauseated or vomiting.  You have a temperature over 101.  You have abdominal pain or discomfort that is severe or gets worse throughout the day.     2 polyps found and removed  You also have mild diverticulosis  Further recommendations to follow pending review of pathology report   at patient request, I called [facility] [phone] -  rolled to voicemail.  Left a message.

## 2024-07-01 NOTE — Anesthesia Postprocedure Evaluation (Signed)
 Anesthesia Post Note  Patient: MYKAH SHIN  Procedure(s) Performed: COLONOSCOPY  Patient location during evaluation: PACU Anesthesia Type: General Level of consciousness: awake and alert Pain management: pain level controlled Vital Signs Assessment: post-procedure vital signs reviewed and stable Respiratory status: spontaneous breathing, nonlabored ventilation, respiratory function stable and patient connected to nasal cannula oxygen Cardiovascular status: stable and blood pressure returned to baseline Postop Assessment: no apparent nausea or vomiting Anesthetic complications: no   No notable events documented.   Last Vitals:  Vitals:   07/01/24 1025 07/01/24 1029  BP: (!) 96/43 (!) 121/55  Pulse:    Resp:    Temp:    SpO2:  (!) 75%    Last Pain:  Vitals:   07/01/24 1024  TempSrc: Oral  PainSc: 0-No pain                 Andrea Limes

## 2024-07-01 NOTE — Anesthesia Preprocedure Evaluation (Addendum)
 Anesthesia Evaluation  Patient identified by MRN, date of birth, ID band Patient awake    Reviewed: Allergy & Precautions, H&P , NPO status , Patient's Chart, lab work & pertinent test results  Airway Mallampati: II  TM Distance: >3 FB Neck ROM: Full    Dental  (+) Edentulous Upper, Edentulous Lower   Pulmonary COPD, Current Smoker    + decreased breath sounds      Cardiovascular hypertension, + CAD, + Past MI, + Peripheral Vascular Disease and +CHF  Normal cardiovascular exam+ dysrhythmias + pacemaker + Cardiac Defibrillator  Rhythm:Regular Rate:Normal  Nl EF 2024   Neuro/Psych  PSYCHIATRIC DISORDERS Anxiety Depression     Neuromuscular disease    GI/Hepatic negative GI ROS, Neg liver ROS,,,  Endo/Other  negative endocrine ROS    Renal/GU Renal disease  negative genitourinary   Musculoskeletal  (+) Arthritis ,    Abdominal   Peds negative pediatric ROS (+)  Hematology  (+) Blood dyscrasia, anemia   Anesthesia Other Findings   Reproductive/Obstetrics negative OB ROS                              Anesthesia Physical Anesthesia Plan  ASA: 3  Anesthesia Plan: General   Post-op Pain Management:    Induction: Intravenous  PONV Risk Score and Plan:   Airway Management Planned: Nasal Cannula  Additional Equipment:   Intra-op Plan:   Post-operative Plan:   Informed Consent: I have reviewed the patients History and Physical, chart, labs and discussed the procedure including the risks, benefits and alternatives for the proposed anesthesia with the patient or authorized representative who has indicated his/her understanding and acceptance.     Dental advisory given  Plan Discussed with: CRNA  Anesthesia Plan Comments:          Anesthesia Quick Evaluation

## 2024-07-02 ENCOUNTER — Ambulatory Visit: Payer: Self-pay | Admitting: Internal Medicine

## 2024-07-02 ENCOUNTER — Encounter (HOSPITAL_COMMUNITY)
Admission: RE | Admit: 2024-07-02 | Discharge: 2024-07-02 | Disposition: A | Discharge status: Home / Self Care | Source: Ambulatory Visit | Attending: Thoracic Surgery (Cardiothoracic Vascular Surgery) | Admitting: Thoracic Surgery (Cardiothoracic Vascular Surgery)

## 2024-07-02 ENCOUNTER — Encounter (HOSPITAL_COMMUNITY): Payer: Self-pay | Admitting: Internal Medicine

## 2024-07-02 DIAGNOSIS — J432 Centrilobular emphysema: Secondary | ICD-10-CM | POA: Diagnosis not present

## 2024-07-02 DIAGNOSIS — R918 Other nonspecific abnormal finding of lung field: Secondary | ICD-10-CM | POA: Insufficient documentation

## 2024-07-02 DIAGNOSIS — I700 Atherosclerosis of aorta: Secondary | ICD-10-CM | POA: Diagnosis not present

## 2024-07-02 LAB — SURGICAL PATHOLOGY

## 2024-07-02 LAB — GLUCOSE, CAPILLARY: Glucose-Capillary: 93 mg/dL (ref 70–99)

## 2024-07-02 MED ORDER — FLUDEOXYGLUCOSE F - 18 (FDG) INJECTION
7.2200 | Freq: Once | INTRAVENOUS | Status: AC
  Administered 2024-07-02: 7.22 via INTRAVENOUS

## 2024-07-08 ENCOUNTER — Ambulatory Visit (HOSPITAL_COMMUNITY)
Admission: RE | Admit: 2024-07-08 | Discharge: 2024-07-08 | Disposition: A | Discharge status: Home / Self Care | Source: Ambulatory Visit | Attending: Thoracic Surgery (Cardiothoracic Vascular Surgery) | Admitting: Thoracic Surgery (Cardiothoracic Vascular Surgery)

## 2024-07-08 DIAGNOSIS — R918 Other nonspecific abnormal finding of lung field: Secondary | ICD-10-CM | POA: Diagnosis not present

## 2024-07-08 LAB — PULMONARY FUNCTION TEST
DL/VA % pred: 40 %
DL/VA: 1.66 ml/min/mmHg/L
DLCO unc % pred: 45 %
DLCO unc: 8.94 ml/min/mmHg
FEF 25-75 Post: 1.18 L/s
FEF 25-75 Pre: 1.23 L/s
FEF2575-%Change-Post: -4 %
FEF2575-%Pred-Post: 78 %
FEF2575-%Pred-Pre: 82 %
FEV1-%Change-Post: 0 %
FEV1-%Pred-Post: 109 %
FEV1-%Pred-Pre: 109 %
FEV1-Post: 2.34 L
FEV1-Pre: 2.34 L
FEV1FVC-%Change-Post: 0 %
FEV1FVC-%Pred-Pre: 90 %
FEV6-%Change-Post: 0 %
FEV6-%Pred-Post: 121 %
FEV6-%Pred-Pre: 121 %
FEV6-Post: 3.41 L
FEV6-Pre: 3.41 L
FEV6FVC-%Change-Post: -1 %
FEV6FVC-%Pred-Post: 102 %
FEV6FVC-%Pred-Pre: 103 %
FVC-%Change-Post: 1 %
FVC-%Pred-Post: 118 %
FVC-%Pred-Pre: 117 %
FVC-Post: 3.61 L
FVC-Pre: 3.57 L
Post FEV1/FVC ratio: 65 %
Post FEV6/FVC ratio: 94 %
Pre FEV1/FVC ratio: 66 %
Pre FEV6/FVC Ratio: 95 %
RV % pred: 96 %
RV: 2.13 L
TLC % pred: 123 %
TLC: 6.96 L

## 2024-07-08 MED ORDER — ALBUTEROL SULFATE (2.5 MG/3ML) 0.083% IN NEBU
2.5000 mg | INHALATION_SOLUTION | Freq: Once | RESPIRATORY_TRACT | Status: AC
  Administered 2024-07-08 (×2): 2.5 mg via RESPIRATORY_TRACT

## 2024-07-09 ENCOUNTER — Ambulatory Visit
Attending: Thoracic Surgery (Cardiothoracic Vascular Surgery) | Admitting: Thoracic Surgery (Cardiothoracic Vascular Surgery)

## 2024-07-09 VITALS — BP 115/63 | HR 61 | Resp 20 | Ht 63.0 in | Wt 147.0 lb

## 2024-07-09 DIAGNOSIS — R918 Other nonspecific abnormal finding of lung field: Secondary | ICD-10-CM | POA: Insufficient documentation

## 2024-07-09 NOTE — Progress Notes (Signed)
 PCP is Shona Norleen PEDLAR, MD Referring Provider is Shona Norleen PEDLAR, MD  Chief Complaint  Patient presents with   Lung Lesion    F/u after PET scan and Pft's    HPI: Roy [name] returns to discuss management of his right lower lobe lung nodule.  Roy [name] is a 78 year old man with a history of tobacco abuse, emphysema, MI, left bundle branch block, congestive heart failure, ICD, ethanol abuse and withdrawal, cirrhosis, PAD, hypertension, hyperlipidemia, and thrombocytopenia.  He was found to have a 3.1 cm spiculated right lower lobe mass on a low-dose CT for lung cancer screening in May.  There were multiple additional nodules scattered bilaterally.  No chest pain, pressure, tightness, or shortness of breath with exertion.  Feels short of breath when he first lies flat on his back but that goes away after about a minute.  No change in appetite or weight loss.  Discussed with Dr. Alvan, he does not feel that any additional cardiac workup would be necessary prior to surgery.  I saw him in the office a couple weeks ago.  He actually did quite well on a 6-minute walk test going 1364 feet.  No desaturation.  He had PET/CT and PFTs and returns to discuss the results.  Roy [name] Score: At the time of surgery this patient's most appropriate activity status/level should be described as: []     0    Normal activity, no symptoms [x]     1    Restricted in physical strenuous activity but ambulatory, able to do out light work []     2    Ambulatory and capable of self care, unable to do work activities, up and about >50 % of waking hours                              []     3    Only limited self care, in bed greater than 50% of waking hours []     4    Completely disabled, no self care, confined to bed or chair []     5    Moribund  Past Medical History:  Diagnosis Date   AICD (automatic cardioverter/defibrillator) present 08/19/2015   St Jude BiV ICD for primary prevention by Dr. Waddell   Alcohol abuse    6  beers per day; hospital admission in 2009 for withdrawal symptoms   Anxiety and depression    denies    Cerebrovascular disease 2009   TIA; 2009- right ICA stent; re-intervention for restenosis complicated by [facility] w/o sx   CHF (congestive heart failure) (HCC) 06/02/2014   Chronic obstructive pulmonary disease (HCC)    Degenerative joint disease    Total shoulder arthroplasty-right   Hyperlipidemia    Hypertension    LBBB (left bundle branch block)    Normal echo-2011; stress nuclear in 09/2010--septal hypoperfusion representing nontransmural infarction or the effect of left bundle branch block, no ischemia   Lung cancer (HCC)    Myocardial infarction (HCC) 06/02/2014   Massive Heart Attack   Peripheral vascular disease (HCC)    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   Thrombocytopenia (HCC)    Tobacco abuse    -100 pack years; 1.5 packs per day   Traumatic seroma of thigh (HCC)    left   Tubular adenoma of colon     Past Surgical History:  Procedure Laterality Date   ABDOMINAL AORTAGRAM [name] 05/04/2014   Procedure: ABDOMINAL AORTAGRAM;  Surgeon: Gaile LELON New, MD;  Location: [facility] CATH LAB;  Service: Cardiovascular;  Laterality: [name];   ABDOMINAL AORTOGRAM [name] 06/25/2017   Procedure: Abdominal Aortogram;  Surgeon: New Gaile LELON, MD;  Location: [facility] INVASIVE CV LAB;  Service: Cardiovascular;  Laterality: [name];   ABDOMINAL AORTOGRAM W/LOWER EXTREMITY [name] 04/22/2018   Procedure: ABDOMINAL AORTOGRAM W/LOWER EXTREMITY;  Surgeon: New Gaile LELON, MD;  [facility]: [facility] LAB;  Service: Cardiovascular;  Laterality: [name];   ABDOMINAL AORTOGRAM W/LOWER EXTREMITY [name] 03/08/2020   Procedure: ABDOMINAL AORTOGRAM W/LOWER EXTREMITY;  Surgeon: New Gaile LELON, MD;  [facility]: [facility];  Service: Cardiovascular;  Laterality: [name];   ABDOMINAL AORTOGRAM W/LOWER EXTREMITY Bilateral 08/23/2020   Procedure: ABDOMINAL AORTOGRAM W/LOWER EXTREMITY;  Surgeon: New Gaile LELON, MD;   Location: [facility] CV LAB;  Service: Cardiovascular;  Laterality: Bilateral;   ABDOMINAL AORTOGRAM W/LOWER EXTREMITY [name] 10/31/2021   Procedure: ABDOMINAL AORTOGRAM W/LOWER EXTREMITY;  Surgeon: New Gaile LELON, MD;  [facility];  Service: Cardiovascular;  Laterality: [name];   ABDOMINAL AORTOGRAM W/LOWER EXTREMITY [name] 12/19/2021   Procedure: ABDOMINAL AORTOGRAM W/LOWER EXTREMITY;  Surgeon: New Gaile LELON, MD;  [facility];  Service: Cardiovascular;  Laterality: [name];   ABDOMINAL AORTOGRAM W/LOWER EXTREMITY Bilateral 02/19/2023   Procedure: ABDOMINAL AORTOGRAM W/LOWER EXTREMITY;  Surgeon: New Gaile LELON, MD;  [facility];  Service: Cardiovascular;  Laterality: Bilateral;   AGILE CAPSULE [name] 03/18/2014   Procedure: AGILE CAPSULE;  Surgeon: Margo LITTIE Haddock, MD;  Location: [facility];  Service: Endoscopy;  Laterality: [name];  7:30   APPLICATION OF WOUND VAC Left 09/03/2017   Procedure: APPLICATION OF WOUND VAC;  Surgeon: Laurence Redell LITTIE, MD;  Location: [facility] OR;  Service: Vascular;  Laterality: Left;   APPLICATION OF WOUND VAC Right 04/07/2020   Procedure: PLACEMENT OF ANTIBIOTIC BEADS AND APPLICATION OF WOUND VAC;  Surgeon: New Gaile LELON, MD;  Location: [facility] OR;  Service: Vascular;  Laterality: Right;   BACK SURGERY     BACTERIAL OVERGROWTH TEST [name] 05/24/2015   Procedure: BACTERIAL OVERGROWTH TEST;  Surgeon: Margo LITTIE Haddock, MD;  Location: [facility];  Service: Endoscopy;  Laterality: [name];  0700   BI-VENTRICULAR IMPLANTABLE CARDIOVERTER DEFIBRILLATOR  (CRT-D)  08/19/2015   BIV ICD GENERATOR CHANGEOUT [name] 11/11/2023   Procedure: BIV ICD GENERATOR CHANGEOUT;  Surgeon: Waddell Danelle LELON, MD;  Location: [facility] INVASIVE CV LAB;  Service: Cardiovascular;  Laterality: [name];   CARPAL TUNNEL RELEASE Left 02/02/2016   Procedure: LEFT CARPAL TUNNEL RELEASE;  Surgeon: Arley Curia, MD;  Location: [facility];  Service: Orthopedics;  Laterality: Left;  ANESTHESIA:  IV REGIONAL UPPER ARM   CATARACT EXTRACTION W/PHACO Right 03/08/2015   Procedure: CATARACT EXTRACTION PHACO AND INTRAOCULAR LENS PLACEMENT (IOC);  Surgeon: Oneil Platts, MD;  Location: [facility];  Service: Ophthalmology;  Laterality: Right;  CDE:9.46   CATARACT EXTRACTION W/PHACO Left 03/22/2015   Procedure: CATARACT EXTRACTION PHACO AND INTRAOCULAR LENS PLACEMENT (IOC);  Surgeon: Oneil Platts, MD;  Location: [facility];  Service: Ophthalmology;  Laterality: Left;  CDE:5.80   COLONOSCOPY  08/22/09   Fields-(Tubular Adenoma)3-mm transverse polyp/4-mm polyp otherwise noraml/small internal hemorrhoids   COLONOSCOPY [name] 04/14/2014   DOQ:dfjoo internal hemorrhids/normal mocsa in the terminal iluem/left colonis redundant   COLONOSCOPY [name] 07/01/2024   Procedure: COLONOSCOPY;  Surgeon: Shaaron Lamar HERO, MD;  [facility];  Service: Endoscopy;  Laterality: [name];  900am, asa 3   COLONOSCOPY W/ POLYPECTOMY  2011  ENDARTERECTOMY FEMORAL Left 08/16/2017   Procedure: ENDARTERECTOMY LEFT PROFUNDA FEMORAL;  Surgeon: Serene Gaile ORN, MD;  Location: [facility] OR;  Service: Vascular;  Laterality: Left;   ENDARTERECTOMY FEMORAL Right 03/31/2020   Procedure: RIGHT FEMORAL ENDARTERECTOMY;  Surgeon: Serene Gaile ORN, MD;  Location: [facility] OR;  Service: Vascular;  Laterality: Right;   EP IMPLANTABLE DEVICE [name] 08/19/2015   Procedure: BiV ICD Insertion CRT-D;  Surgeon: Danelle ORN Birmingham, MD;  Location: [facility] INVASIVE CV LAB;  Service: Cardiovascular;  Laterality: [name];   ESOPHAGOGASTRODUODENOSCOPY [name] 03/05/2014   SLF: 1. Stricture at the gastroesophagael junction 2. Small hiatal hernia 3. Moderate non-erosive gastritis and duodentitis. 4. No surce for Melena identified.    FEMORAL-POPLITEAL BYPASS GRAFT Left 08/16/2017   Procedure: LEFT  FEMORAL-POPLITEAL ARTERY  BYPASS GRAFT;  Surgeon: Serene Gaile ORN, MD;  [facility];  Service: Vascular;  Laterality: Left;   GIVENS CAPSULE STUDY [name] 03/30/2014   Procedure: GIVENS CAPSULE STUDY;   Surgeon: Margo LITTIE Haddock, MD;  [facility];  Service: Endoscopy;  Laterality: [name];  7:30   GROIN DEBRIDEMENT Right 04/07/2020   Procedure: INCISION AND DRAINAGE OF RIGHT GROIN;  Surgeon: Serene Gaile ORN, MD;  Location: [facility] OR;  Service: Vascular;  Laterality: Right;   I & D EXTREMITY Left 09/03/2017   Procedure: IRRIGATION AND DEBRIDEMENT EXTREMITY LEFT THIGH SEROMA;  Surgeon: Laurence Redell LITTIE, MD;  Location: [facility];  Service: Vascular;  Laterality: Left;   INSERT / REPLACE / REMOVE PACEMAKER     INSERTION OF ILIAC STENT  03/31/2020   Procedure: INSERTION OF RIGHT ILIAC ARTERY STENT AND RIGHT SUPERFICIAL FEMORAL ARTERY STENT;  Surgeon: Serene Gaile ORN, MD;  Location: [facility];  Service: Vascular;;   IR ANGIO INTRA EXTRACRAN SEL COM CAROTID INNOMINATE BILAT MOD SED  07/16/2017   IR ANGIO INTRA EXTRACRAN SEL COM CAROTID INNOMINATE BILAT MOD SED  06/12/2019   IR ANGIO INTRA EXTRACRAN SEL COM CAROTID INNOMINATE BILAT MOD SED  01/17/2021   IR ANGIO VERTEBRAL SEL SUBCLAVIAN INNOMINATE BILAT MOD SED  07/16/2017   IR ANGIO VERTEBRAL SEL SUBCLAVIAN INNOMINATE BILAT MOD SED  06/12/2019   IR ANGIO VERTEBRAL SEL SUBCLAVIAN INNOMINATE BILAT MOD SED  01/17/2021   IR RADIOLOGIST EVAL & MGMT  04/01/2017   IR RADIOLOGIST EVAL & MGMT  08/02/2017   IR TRANSCATH EXCRAN VERT OR CAR A STENT  07/22/2019   IR US  GUIDE VASC ACCESS RIGHT  06/12/2019   IR US  GUIDE VASC ACCESS RIGHT  01/17/2021   JOINT REPLACEMENT Right    Total Shoulder Replacement    LOWER EXTREMITY ANGIOGRAPHY Bilateral 06/25/2017   Procedure: Lower Extremity Angiography;  Surgeon: Serene Gaile ORN, MD;  [facility];  Service: Cardiovascular;  Laterality: Bilateral;   LUMBAR FUSION  2010   PATCH ANGIOPLASTY Right 03/31/2020   Procedure: PATCH ANGIOPLASTY USING XENOSURE BIOLOGIC PATCH 1cm x 14cm;  Surgeon: Serene Gaile ORN, MD;  Location: [facility] OR;  Service: Vascular;  Laterality: Right;   PERIPHERAL VASCULAR ATHERECTOMY Right 04/22/2018   Procedure:  PERIPHERAL VASCULAR ATHERECTOMY;  Surgeon: Serene Gaile ORN, MD;  [facility];  Service: Cardiovascular;  Laterality: Right;  right superficial femoral   PERIPHERAL VASCULAR BALLOON ANGIOPLASTY Right 04/22/2018   Procedure: PERIPHERAL VASCULAR BALLOON ANGIOPLASTY;  Surgeon: Serene Gaile ORN, MD;  [facility];  Service: Cardiovascular;  Laterality: Right;  external iliac   PERIPHERAL VASCULAR BALLOON ANGIOPLASTY Left 03/08/2020   Procedure: PERIPHERAL VASCULAR BALLOON ANGIOPLASTY;  Surgeon: Serene Gaile ORN, MD;  [facility];  Service: Cardiovascular;  Laterality: Left;  Common Femoral   PERIPHERAL VASCULAR BALLOON ANGIOPLASTY Left 08/23/2020   Procedure: PERIPHERAL VASCULAR BALLOON ANGIOPLASTY;  Surgeon: Serene Gaile ORN, MD;  [facility];  Service: Cardiovascular;  Laterality: Left;  common femoral   PERIPHERAL VASCULAR BALLOON ANGIOPLASTY Left 10/31/2021   Procedure: PERIPHERAL VASCULAR BALLOON ANGIOPLASTY;  Surgeon: Serene Gaile ORN, MD;  [facility];  Service: Cardiovascular;  Laterality: Left;  external iliac   PERIPHERAL VASCULAR INTERVENTION Right 03/08/2020   Procedure: PERIPHERAL VASCULAR INTERVENTION;  Surgeon: Serene Gaile ORN, MD;  [facility];  Service: Cardiovascular;  Laterality: Right;  SFA   PERIPHERAL VASCULAR INTERVENTION Right 08/23/2020   Procedure: PERIPHERAL VASCULAR INTERVENTION;  Surgeon: Serene Gaile ORN, MD;  [facility];  Service: Cardiovascular;  Laterality: Right;  external iliac   PERIPHERAL VASCULAR INTERVENTION Right 10/31/2021   Procedure: PERIPHERAL VASCULAR INTERVENTION;  Surgeon: Serene Gaile ORN, MD;  [facility];  Service: Cardiovascular;  Laterality: Right;  superficial femoral artery   PERIPHERAL VASCULAR INTERVENTION Left 12/19/2021   Procedure: PERIPHERAL VASCULAR INTERVENTION;  Surgeon: Serene Gaile ORN, MD;  [facility]  [facility];  Service: Cardiovascular;  Laterality: Left;  stent lt ext iliac / pta common femoral   PERIPHERAL VASCULAR INTERVENTION  02/19/2023   Procedure: PERIPHERAL VASCULAR INTERVENTION;  Surgeon: Serene Gaile ORN, MD;  [facility];  Service: Cardiovascular;;   RADIOLOGY WITH ANESTHESIA [name] 07/01/2019   Procedure: STENTING;  Surgeon: Dolphus Carrion, MD;  Location: [facility] OR;  Service: Radiology;  Laterality: [name];   RADIOLOGY WITH ANESTHESIA [name] 07/22/2019   Procedure: STENTING;  Surgeon: Dolphus Carrion, MD;  Location: [facility] OR;  Service: Radiology;  Laterality: [name];   TOTAL SHOULDER ARTHROPLASTY Right 2011   Dr. Dozier COWBOY NERVE TRANSPOSITION Left 02/02/2016   Procedure: LEFT IN-SITU DECOMPRESSION ULNAR NERVE ;  Surgeon: Arley Curia, MD;  Location: [facility];  Service: Orthopedics;  Laterality: Left;   ULNAR TUNNEL RELEASE Left 02/02/2016   Procedure: LEFT CUBITAL TUNNEL RELEASE;  Surgeon: Arley Curia, MD;  Location:  SURGERY CENTER;  Service: Orthopedics;  Laterality: Left;   VASECTOMY  1971   VEIN HARVEST Left 08/16/2017   Procedure: USING NON REVERSE LEFT GREATER SAPHENOUS VEIN HARVEST;  Surgeon: Serene Gaile ORN, MD;  Location: [facility] OR;  Service: Vascular;  Laterality: Left;    Family History  Problem Relation Age of Onset   Coronary artery disease Mother    Diabetes Mother    Heart disease Mother        Before age 83 - 5 Bypasses   Hypertension Mother    Heart attack Mother        3-4 Heart attacks   Alzheimer's disease Father    Diabetes Father    Heart attack Sister    Cancer Brother        Prostate   Hyperlipidemia Son    Prostate cancer Brother    Alzheimer's disease Sister    Colon cancer Neg Hx     Social History Social History   Tobacco Use   Smoking status: Every Day    Current packs/day: 1.00    Average packs/day: 1 pack/day for 66.9 years (66.9 ttl pk-yrs)    Types: Cigarettes    Start date: 08/20/1957    Passive  exposure: Current   Smokeless tobacco:  Never  Vaping Use   Vaping status: Former   Start date: 09/10/2016   Quit date: 10/11/2016  Substance Use Topics   Alcohol use: No    Alcohol/week: 0.0 standard drinks of alcohol    Comment: quit in July 2015   Drug use: No    Current Outpatient Medications  Medication Sig Dispense Refill   aspirin EC 81 MG tablet Take 81 mg by mouth every evening.      atorvastatin (LIPITOR) 20 MG tablet Take 20 mg by mouth every evening.     BRILINTA 90 MG TABS tablet Take 90 mg by mouth 2 (two) times daily.     carvedilol (COREG) 6.25 MG tablet TAKE 1 TABLET TWICE A DAY WITH MEALS (DOSE CHANGE, NEW PRESCRIPTION) 180 tablet 2   cetirizine (ZYRTEC) 10 MG tablet Take 10 mg by mouth daily.     Cobalamin Combinations (NEURIVA PLUS) CAPS Take 1 capsule by mouth daily.     dextromethorphan-guaiFENesin (MUCINEX DM) 30-600 MG 12hr tablet Take 1 tablet by mouth 2 (two) times daily.     gabapentin (NEURONTIN) 300 MG capsule Take 300 mg by mouth 2 (two) times daily.      HYDROcodone-acetaminophen (NORCO/VICODIN) 5-325 MG tablet Take 1-2 tablets by mouth See admin instructions. Take 2 tablets in the morning and evening, may take 1 tablet at lunch as needed for pain     Magnesium 250 MG TABS Take 250 mg by mouth every evening.     Multiple Vitamins-Minerals (CENTRUM SILVER PO) Take 1 tablet by mouth daily. 50+     Naphazoline HCl (CLEAR EYES OP) Place 1 drop into both eyes daily as needed (dry eyes).     Omega-3 Fatty Acids (FISH OIL) 1000 MG CAPS Take 1,000 mg by mouth every evening.     sacubitril-valsartan (ENTRESTO) 97-103 MG TAKE 1 TABLET TWICE A DAY (OVERDUE FOR APPOINTMENT) 60 tablet 0   Salicylic Acid (GOLD BOND PSORIASIS RELIEF) 3 % CREA Apply 1 application topically daily as needed (psoriasis).     silodosin (RAPAFLO) 8 MG CAPS capsule Take 8 mg by mouth daily with breakfast.     SUPER B COMPLEX/C PO Take 1 tablet by mouth every evening.     tamsulosin (FLOMAX)  0.4 MG CAPS capsule Take 0.4 mg by mouth daily after supper.     traMADol (ULTRAM) 50 MG tablet Take 50 mg by mouth 2 (two) times daily as needed for moderate pain (pain score 4-6).     No current facility-administered medications for this visit.    No Known Allergies  Review of Systems  Constitutional:  Negative for activity change, fatigue and unexpected weight change.  HENT:  Negative for trouble swallowing and voice change.   Eyes:  Negative for visual disturbance.  Respiratory:  Positive for shortness of breath and wheezing.   Cardiovascular:  Negative for chest pain and leg swelling.  Musculoskeletal:  Positive for back pain.  Hematological:  Bruises/bleeds easily.  All other systems reviewed and are negative.   BP 115/63   Pulse 61   Resp 20   Ht 5' 3 (1.6 m)   Wt 147 lb (66.7 kg)   SpO2 96% Comment: RA  BMI 26.04 kg/m  Physical Exam Vitals reviewed.  Constitutional:      General: He is not in acute distress.    Appearance: Normal appearance.  Eyes:     General: No scleral icterus.    Extraocular Movements: Extraocular movements intact.  Cardiovascular:  Rate and Rhythm: Normal rate and regular rhythm.     Heart sounds: Normal heart sounds. No murmur heard. Pulmonary:     Effort: Pulmonary effort is normal. No respiratory distress.     Breath sounds: Normal breath sounds. No wheezing or rales.  Abdominal:     General: There is no distension.     Palpations: Abdomen is soft.  Skin:    General: Skin is warm and dry.  Neurological:     General: No focal deficit present.     Mental Status: He is alert and oriented to person, place, and time.     Cranial Nerves: No cranial nerve deficit.     Motor: No weakness.     Diagnostic Tests: NUCLEAR MEDICINE PET SKULL BASE TO THIGH   TECHNIQUE: 7.22 mCi F-18 FDG was injected intravenously. Full-ring PET imaging was performed from the skull base to thigh after the radiotracer. CT data was obtained and used for  attenuation correction and anatomic localization.   Fasting blood glucose: 93 mg/dl   COMPARISON:  Chest CT 04/11/2024   FINDINGS: Mediastinal blood pool activity: SUV max 2.1   NECK:   No hypermetabolic cervical lymph nodes are identified. No suspicious activity identified within the pharyngeal mucosal space.   Incidental CT findings: Severe bilateral carotid atherosclerosis.   CHEST:   There are no hypermetabolic mediastinal, hilar or axillary lymph nodes. There is marked hypermetabolic activity within the previously demonstrated spiculated right lower lobe mass. This mass measures 3.4 x 2.9 cm on image 50/7 and has an SUV max of 16.2. Additional scattered pulmonary nodules are grossly stable, without additional hypermetabolic activity.   Incidental CT findings: Left subclavian pacemaker. Diffuse atherosclerosis of the aorta, great vessels and coronary arteries. Moderate centrilobular and paraseptal emphysema.   ABDOMEN/PELVIS:   There is no hypermetabolic activity within the liver, adrenal glands, spleen or pancreas. There is no hypermetabolic nodal activity in the abdomen or pelvis.   Incidental CT findings: Diffuse aortic and branch vessel atherosclerosis. Bilateral iliac and right femoral arterial stents.   SKELETON:   There is no hypermetabolic activity to suggest osseous metastatic disease.   Incidental CT findings: Right shoulder arthroplasty. Mild multilevel spondylosis.   IMPRESSION: 1. The previously demonstrated spiculated right lower lobe mass is hypermetabolic, consistent with primary bronchogenic carcinoma. 2. No evidence of metastatic disease. Additional scattered pulmonary nodules are grossly stable, without hypermetabolic activity. Although likely benign, these are suboptimally evaluated based on size. Attention on follow-up recommended. 3. Aortic Atherosclerosis (ICD10-I70.0) and Emphysema (ICD10-J43.9).     Electronically Signed   By:  Elsie Perone M.D.   On: 07/02/2024 14:27 I personally reviewed the PET/CT images.  Dominant right lower lobe lung nodule is markedly hypermetabolic.  No evidence of regional or distant metastatic disease.  Has numerous other pulmonary nodules which are not hypermetabolic but are too small to characterize.  Severe aortic and coronary atherosclerosis.  Porcelain ascending aorta.  ICD in place.  Pulmonary function testing FVC 3.57 (117%) FEV1 2.34 (109%) No change with bronchodilator DLCO 8.94 (45%)  Impression: Roy [name] is a 78 year old man with a history of tobacco abuse, emphysema, MI, left bundle branch block, congestive heart failure, ICD, ethanol abuse and withdrawal, cirrhosis, PAD, hypertension, hyperlipidemia, and thrombocytopenia.  Found to have a 3.1 cm spiculated right lower lobe mass on a low-dose CT for lung cancer screening.  Right lower lobe nodule-PET/CT showed the nodule is hypermetabolic.  No regional or distant metastatic disease.  Clinical stage is Ib (  T2a, N0).  We again reviewed options of surgical resection versus radiation for treatment of the mass.  His FEV1 is actually quite good and he did well with a 6-minute walk test.  However his diffusion capacity is borderline at 45% preoperatively.  There is a significant chance he could end up requiring oxygen afterwards.  Also high risk patient with his atherosclerotic cardiovascular disease, ongoing tobacco use and liver issues.  He favor surgery overall but is a little concerned about the possibility of needing oxygen.  Unable to reach a definitive decision today.  I offered to refer him to radiation oncology for further discussion with them.  He does not want to pursue that yet.  After discussion we decided to proceed with robotic bronchoscopy and endobronchial ultrasound for diagnostic and staging purposes.  I informed of the general nature of the procedure including the need for general anesthesia.  He understands it is  an endoscopic procedure that we will do on an outpatient basis.  I informed him of the indications, risks, benefits, and alternatives.  He does understand there is no guarantee of a definitive diagnosis.  He understands the risk include those associated with general anesthesia.  He understands the risks include, but not limited to death, MI, DVT, PE, bleeding, pneumothorax, as well as possibility of other procedural complications.  He will need to hold Brilinta  for 5 days prior to surgery.  I will check with Dr. Dolphus that.  Plan: Robotic bronchoscopy and endobronchial ultrasound on Thursday, 07/30/2024  I spent over 30 minutes in review of records, images, and in consultation with Roy [name] today. [name], MD [facility] [phone]

## 2024-07-09 NOTE — H&P (View-Only) (Signed)
 PCP is Shona Norleen PEDLAR, MD Referring Provider is Shona Norleen PEDLAR, MD  Chief Complaint  Patient presents with   Lung Lesion    F/u after PET scan and Pft's    HPI: Roy [name] returns to discuss management of his right lower lobe lung nodule.  Roy [name] is a 78 year old man with a history of tobacco abuse, emphysema, MI, left bundle branch block, congestive heart failure, ICD, ethanol abuse and withdrawal, cirrhosis, PAD, hypertension, hyperlipidemia, and thrombocytopenia.  He was found to have a 3.1 cm spiculated right lower lobe mass on a low-dose CT for lung cancer screening in May.  There were multiple additional nodules scattered bilaterally.  No chest pain, pressure, tightness, or shortness of breath with exertion.  Feels short of breath when he first lies flat on his back but that goes away after about a minute.  No change in appetite or weight loss.  Discussed with Dr. Alvan, he does not feel that any additional cardiac workup would be necessary prior to surgery.  I saw him in the office a couple weeks ago.  He actually did quite well on a 6-minute walk test going 1364 feet.  No desaturation.  He had PET/CT and PFTs and returns to discuss the results.  Roy [name]: At the time of surgery this patient's most appropriate activity status/level should be described as: []     0    Normal activity, no symptoms [x]     1    Restricted in physical strenuous activity but ambulatory, able to do out light work []     2    Ambulatory and capable of self care, unable to do work activities, up and about >50 % of waking hours                              []     3    Only limited self care, in bed greater than 50% of waking hours []     4    Completely disabled, no self care, confined to bed or chair []     5    Moribund  Past Medical History:  Diagnosis Date   AICD (automatic cardioverter/defibrillator) present 08/19/2015   St Jude BiV ICD for primary prevention by Dr. Waddell   Alcohol abuse    6  beers per day; hospital admission in 2009 for withdrawal symptoms   Anxiety and depression    denies    Cerebrovascular disease 2009   TIA; 2009- right ICA stent; re-intervention for restenosis complicated by [facility] w/o sx   CHF (congestive heart failure) (HCC) 06/02/2014   Chronic obstructive pulmonary disease (HCC)    Degenerative joint disease    Total shoulder arthroplasty-right   Hyperlipidemia    Hypertension    LBBB (left bundle branch block)    Normal echo-2011; stress nuclear in 09/2010--septal hypoperfusion representing nontransmural infarction or the effect of left bundle branch block, no ischemia   Lung cancer (HCC)    Myocardial infarction (HCC) 06/02/2014   Massive Heart Attack   Peripheral vascular disease (HCC)    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   Thrombocytopenia (HCC)    Tobacco abuse    -100 pack years; 1.5 packs per day   Traumatic seroma of thigh (HCC)    left   Tubular adenoma of colon     Past Surgical History:  Procedure Laterality Date   ABDOMINAL AORTAGRAM [name] 05/04/2014   Procedure: ABDOMINAL AORTAGRAM;  Surgeon: Gaile LELON New, MD;  Location: [facility] CATH LAB;  Service: Cardiovascular;  Laterality: [name];   ABDOMINAL AORTOGRAM [name] 06/25/2017   Procedure: Abdominal Aortogram;  Surgeon: New Gaile LELON, MD;  Location: [facility] INVASIVE CV LAB;  Service: Cardiovascular;  Laterality: [name];   ABDOMINAL AORTOGRAM W/LOWER EXTREMITY [name] 04/22/2018   Procedure: ABDOMINAL AORTOGRAM W/LOWER EXTREMITY;  Surgeon: New Gaile LELON, MD;  [facility]: [facility];  Service: Cardiovascular;  Laterality: [name];   ABDOMINAL AORTOGRAM W/LOWER EXTREMITY [name] 03/08/2020   Procedure: ABDOMINAL AORTOGRAM W/LOWER EXTREMITY;  Surgeon: New Gaile LELON, MD;  [facility]: [facility];  Service: Cardiovascular;  Laterality: [name];   ABDOMINAL AORTOGRAM W/LOWER EXTREMITY Bilateral 08/23/2020   Procedure: ABDOMINAL AORTOGRAM W/LOWER EXTREMITY;  Surgeon: New Gaile LELON, MD;   [facility]: [facility] CV LAB;  Service: Cardiovascular;  Laterality: Bilateral;   ABDOMINAL AORTOGRAM W/LOWER EXTREMITY [name] 10/31/2021   Procedure: ABDOMINAL AORTOGRAM W/LOWER EXTREMITY;  Surgeon: New Gaile LELON, MD;  [facility];  Service: Cardiovascular;  Laterality: [name];   ABDOMINAL AORTOGRAM W/LOWER EXTREMITY [name] 12/19/2021   Procedure: ABDOMINAL AORTOGRAM W/LOWER EXTREMITY;  Surgeon: New Gaile LELON, MD;  [facility];  Service: Cardiovascular;  Laterality: [name];   ABDOMINAL AORTOGRAM W/LOWER EXTREMITY Bilateral 02/19/2023   Procedure: ABDOMINAL AORTOGRAM W/LOWER EXTREMITY;  Surgeon: New Gaile LELON, MD;  [facility];  Service: Cardiovascular;  Laterality: Bilateral;   AGILE CAPSULE [name] 03/18/2014   Procedure: AGILE CAPSULE;  Surgeon: Margo LITTIE Haddock, MD;  Location: [facility];  Service: Endoscopy;  Laterality: [name];  7:30   APPLICATION OF WOUND VAC Left 09/03/2017   Procedure: APPLICATION OF WOUND VAC;  Surgeon: Laurence Redell LITTIE, MD;  Location: [facility] OR;  Service: Vascular;  Laterality: Left;   APPLICATION OF WOUND VAC Right 04/07/2020   Procedure: PLACEMENT OF ANTIBIOTIC BEADS AND APPLICATION OF WOUND VAC;  Surgeon: New Gaile LELON, MD;  [facility]: [facility] OR;  Service: Vascular;  Laterality: Right;   BACK SURGERY     BACTERIAL OVERGROWTH TEST [name] 05/24/2015   Procedure: BACTERIAL OVERGROWTH TEST;  Surgeon: Margo LITTIE Haddock, MD;  Location: [facility];  Service: Endoscopy;  Laterality: [name];  0700   BI-VENTRICULAR IMPLANTABLE CARDIOVERTER DEFIBRILLATOR  (CRT-D)  08/19/2015   BIV ICD GENERATOR CHANGEOUT [name] 11/11/2023   Procedure: BIV ICD GENERATOR CHANGEOUT;  Surgeon: Waddell Danelle LELON, MD;  Location: [facility] INVASIVE CV LAB;  Service: Cardiovascular;  Laterality: [name];   CARPAL TUNNEL RELEASE Left 02/02/2016   Procedure: LEFT CARPAL TUNNEL RELEASE;  Surgeon: Arley Curia, MD;  Location: [facility];  Service: Orthopedics;  Laterality: Left;  ANESTHESIA:  IV REGIONAL UPPER ARM   CATARACT EXTRACTION W/PHACO Right 03/08/2015   Procedure: CATARACT EXTRACTION PHACO AND INTRAOCULAR LENS PLACEMENT (IOC);  Surgeon: Oneil Platts, MD;  Location: [facility];  Service: Ophthalmology;  Laterality: Right;  CDE:9.46   CATARACT EXTRACTION W/PHACO Left 03/22/2015   Procedure: CATARACT EXTRACTION PHACO AND INTRAOCULAR LENS PLACEMENT (IOC);  Surgeon: Oneil Platts, MD;  Location: [facility];  Service: Ophthalmology;  Laterality: Left;  CDE:5.80   COLONOSCOPY  08/22/09   Fields-(Tubular Adenoma)3-mm transverse polyp/4-mm polyp otherwise noraml/small internal hemorrhoids   COLONOSCOPY [name] 04/14/2014   DOQ:dfjoo internal hemorrhids/normal mocsa in the terminal iluem/left colonis redundant   COLONOSCOPY [name] 07/01/2024   Procedure: COLONOSCOPY;  Surgeon: Shaaron Lamar HERO, MD;  [facility];  Service: Endoscopy;  Laterality: [name];  900am, asa 3   COLONOSCOPY W/ POLYPECTOMY  2011  ENDARTERECTOMY FEMORAL Left 08/16/2017   Procedure: ENDARTERECTOMY LEFT PROFUNDA FEMORAL;  Surgeon: Serene Gaile ORN, MD;  Location: [facility] OR;  Service: Vascular;  Laterality: Left;   ENDARTERECTOMY FEMORAL Right 03/31/2020   Procedure: RIGHT FEMORAL ENDARTERECTOMY;  Surgeon: Serene Gaile ORN, MD;  Location: [facility] OR;  Service: Vascular;  Laterality: Right;   EP IMPLANTABLE DEVICE [name] 08/19/2015   Procedure: BiV ICD Insertion CRT-D;  Surgeon: Danelle ORN Birmingham, MD;  Location: [facility] INVASIVE CV LAB;  Service: Cardiovascular;  Laterality: [name];   ESOPHAGOGASTRODUODENOSCOPY [name] 03/05/2014   SLF: 1. Stricture at the gastroesophagael junction 2. Small hiatal hernia 3. Moderate non-erosive gastritis and duodentitis. 4. No surce for Melena identified.    FEMORAL-POPLITEAL BYPASS GRAFT Left 08/16/2017   Procedure: LEFT  FEMORAL-POPLITEAL ARTERY  BYPASS GRAFT;  Surgeon: Serene Gaile ORN, MD;  [facility];  Service: Vascular;  Laterality: Left;   GIVENS CAPSULE STUDY [name] 03/30/2014   Procedure: GIVENS CAPSULE STUDY;   Surgeon: Margo LITTIE Haddock, MD;  [facility];  Service: Endoscopy;  Laterality: [name];  7:30   GROIN DEBRIDEMENT Right 04/07/2020   Procedure: INCISION AND DRAINAGE OF RIGHT GROIN;  Surgeon: Serene Gaile ORN, MD;  Location: [facility] OR;  Service: Vascular;  Laterality: Right;   I & D EXTREMITY Left 09/03/2017   Procedure: IRRIGATION AND DEBRIDEMENT EXTREMITY LEFT THIGH SEROMA;  Surgeon: Laurence Redell LITTIE, MD;  Location: [facility];  Service: Vascular;  Laterality: Left;   INSERT / REPLACE / REMOVE PACEMAKER     INSERTION OF ILIAC STENT  03/31/2020   Procedure: INSERTION OF RIGHT ILIAC ARTERY STENT AND RIGHT SUPERFICIAL FEMORAL ARTERY STENT;  Surgeon: Serene Gaile ORN, MD;  Location: [facility];  Service: Vascular;;   IR ANGIO INTRA EXTRACRAN SEL COM CAROTID INNOMINATE BILAT MOD SED  07/16/2017   IR ANGIO INTRA EXTRACRAN SEL COM CAROTID INNOMINATE BILAT MOD SED  06/12/2019   IR ANGIO INTRA EXTRACRAN SEL COM CAROTID INNOMINATE BILAT MOD SED  01/17/2021   IR ANGIO VERTEBRAL SEL SUBCLAVIAN INNOMINATE BILAT MOD SED  07/16/2017   IR ANGIO VERTEBRAL SEL SUBCLAVIAN INNOMINATE BILAT MOD SED  06/12/2019   IR ANGIO VERTEBRAL SEL SUBCLAVIAN INNOMINATE BILAT MOD SED  01/17/2021   IR RADIOLOGIST EVAL & MGMT  04/01/2017   IR RADIOLOGIST EVAL & MGMT  08/02/2017   IR TRANSCATH EXCRAN VERT OR CAR A STENT  07/22/2019   IR US  GUIDE VASC ACCESS RIGHT  06/12/2019   IR US  GUIDE VASC ACCESS RIGHT  01/17/2021   JOINT REPLACEMENT Right    Total Shoulder Replacement    LOWER EXTREMITY ANGIOGRAPHY Bilateral 06/25/2017   Procedure: Lower Extremity Angiography;  Surgeon: Serene Gaile ORN, MD;  [facility];  Service: Cardiovascular;  Laterality: Bilateral;   LUMBAR FUSION  2010   PATCH ANGIOPLASTY Right 03/31/2020   Procedure: PATCH ANGIOPLASTY USING XENOSURE BIOLOGIC PATCH 1cm x 14cm;  Surgeon: Serene Gaile ORN, MD;  Location: [facility] OR;  Service: Vascular;  Laterality: Right;   PERIPHERAL VASCULAR ATHERECTOMY Right 04/22/2018   Procedure:  PERIPHERAL VASCULAR ATHERECTOMY;  Surgeon: Serene Gaile ORN, MD;  [facility];  Service: Cardiovascular;  Laterality: Right;  right superficial femoral   PERIPHERAL VASCULAR BALLOON ANGIOPLASTY Right 04/22/2018   Procedure: PERIPHERAL VASCULAR BALLOON ANGIOPLASTY;  Surgeon: Serene Gaile ORN, MD;  [facility];  Service: Cardiovascular;  Laterality: Right;  external iliac   PERIPHERAL VASCULAR BALLOON ANGIOPLASTY Left 03/08/2020   Procedure: PERIPHERAL VASCULAR BALLOON ANGIOPLASTY;  Surgeon: Serene Gaile ORN, MD;  [facility];  Service: Cardiovascular;  Laterality: Left;  Common Femoral   PERIPHERAL VASCULAR BALLOON ANGIOPLASTY Left 08/23/2020   Procedure: PERIPHERAL VASCULAR BALLOON ANGIOPLASTY;  Surgeon: Serene Gaile ORN, MD;  [facility];  Service: Cardiovascular;  Laterality: Left;  common femoral   PERIPHERAL VASCULAR BALLOON ANGIOPLASTY Left 10/31/2021   Procedure: PERIPHERAL VASCULAR BALLOON ANGIOPLASTY;  Surgeon: Serene Gaile ORN, MD;  [facility];  Service: Cardiovascular;  Laterality: Left;  external iliac   PERIPHERAL VASCULAR INTERVENTION Right 03/08/2020   Procedure: PERIPHERAL VASCULAR INTERVENTION;  Surgeon: Serene Gaile ORN, MD;  [facility];  Service: Cardiovascular;  Laterality: Right;  SFA   PERIPHERAL VASCULAR INTERVENTION Right 08/23/2020   Procedure: PERIPHERAL VASCULAR INTERVENTION;  Surgeon: Serene Gaile ORN, MD;  [facility];  Service: Cardiovascular;  Laterality: Right;  external iliac   PERIPHERAL VASCULAR INTERVENTION Right 10/31/2021   Procedure: PERIPHERAL VASCULAR INTERVENTION;  Surgeon: Serene Gaile ORN, MD;  [facility];  Service: Cardiovascular;  Laterality: Right;  superficial femoral artery   PERIPHERAL VASCULAR INTERVENTION Left 12/19/2021   Procedure: PERIPHERAL VASCULAR INTERVENTION;  Surgeon: Serene Gaile ORN, MD;  [facility]  [facility];  Service: Cardiovascular;  Laterality: Left;  stent lt ext iliac / pta common femoral   PERIPHERAL VASCULAR INTERVENTION  02/19/2023   Procedure: PERIPHERAL VASCULAR INTERVENTION;  Surgeon: Serene Gaile ORN, MD;  [facility];  Service: Cardiovascular;;   RADIOLOGY WITH ANESTHESIA [name] 07/01/2019   Procedure: STENTING;  Surgeon: Dolphus Carrion, MD;  [facility]: [facility] OR;  Service: Radiology;  Laterality: [name];   RADIOLOGY WITH ANESTHESIA [name] 07/22/2019   Procedure: STENTING;  Surgeon: Dolphus Carrion, MD;  Location: [facility] OR;  Service: Radiology;  Laterality: [name];   TOTAL SHOULDER ARTHROPLASTY Right 2011   Dr. Dozier COWBOY NERVE TRANSPOSITION Left 02/02/2016   Procedure: LEFT IN-SITU DECOMPRESSION ULNAR NERVE ;  Surgeon: Arley Curia, MD;  Location: [facility];  Service: Orthopedics;  Laterality: Left;   ULNAR TUNNEL RELEASE Left 02/02/2016   Procedure: LEFT CUBITAL TUNNEL RELEASE;  Surgeon: Arley Curia, MD;  Location:  SURGERY CENTER;  Service: Orthopedics;  Laterality: Left;   VASECTOMY  1971   VEIN HARVEST Left 08/16/2017   Procedure: USING NON REVERSE LEFT GREATER SAPHENOUS VEIN HARVEST;  Surgeon: Serene Gaile ORN, MD;  Location: [facility] OR;  Service: Vascular;  Laterality: Left;    Family History  Problem Relation Age of Onset   Coronary artery disease Mother    Diabetes Mother    Heart disease Mother        Before age 83 - 5 Bypasses   Hypertension Mother    Heart attack Mother        3-4 Heart attacks   Alzheimer's disease Father    Diabetes Father    Heart attack Sister    Cancer Brother        Prostate   Hyperlipidemia Son    Prostate cancer Brother    Alzheimer's disease Sister    Colon cancer Neg Hx     Social History Social History   Tobacco Use   Smoking status: Every Day    Current packs/day: 1.00    Average packs/day: 1 pack/day for 66.9 years (66.9 ttl pk-yrs)    Types: Cigarettes    Start date: 08/20/1957    Passive  exposure: Current   Smokeless tobacco:  Never  Vaping Use   Vaping status: Former   Start date: 09/10/2016   Quit date: 10/11/2016  Substance Use Topics   Alcohol use: No    Alcohol/week: 0.0 standard drinks of alcohol    Comment: quit in July 2015   Drug use: No    Current Outpatient Medications  Medication Sig Dispense Refill   aspirin EC 81 MG tablet Take 81 mg by mouth every evening.      atorvastatin (LIPITOR) 20 MG tablet Take 20 mg by mouth every evening.     BRILINTA 90 MG TABS tablet Take 90 mg by mouth 2 (two) times daily.     carvedilol (COREG) 6.25 MG tablet TAKE 1 TABLET TWICE A DAY WITH MEALS (DOSE CHANGE, NEW PRESCRIPTION) 180 tablet 2   cetirizine (ZYRTEC) 10 MG tablet Take 10 mg by mouth daily.     Cobalamin Combinations (NEURIVA PLUS) CAPS Take 1 capsule by mouth daily.     dextromethorphan-guaiFENesin (MUCINEX DM) 30-600 MG 12hr tablet Take 1 tablet by mouth 2 (two) times daily.     gabapentin (NEURONTIN) 300 MG capsule Take 300 mg by mouth 2 (two) times daily.      HYDROcodone-acetaminophen (NORCO/VICODIN) 5-325 MG tablet Take 1-2 tablets by mouth See admin instructions. Take 2 tablets in the morning and evening, may take 1 tablet at lunch as needed for pain     Magnesium 250 MG TABS Take 250 mg by mouth every evening.     Multiple Vitamins-Minerals (CENTRUM SILVER PO) Take 1 tablet by mouth daily. 50+     Naphazoline HCl (CLEAR EYES OP) Place 1 drop into both eyes daily as needed (dry eyes).     Omega-3 Fatty Acids (FISH OIL) 1000 MG CAPS Take 1,000 mg by mouth every evening.     sacubitril-valsartan (ENTRESTO) 97-103 MG TAKE 1 TABLET TWICE A DAY (OVERDUE FOR APPOINTMENT) 60 tablet 0   Salicylic Acid (GOLD BOND PSORIASIS RELIEF) 3 % CREA Apply 1 application topically daily as needed (psoriasis).     silodosin (RAPAFLO) 8 MG CAPS capsule Take 8 mg by mouth daily with breakfast.     SUPER B COMPLEX/C PO Take 1 tablet by mouth every evening.     tamsulosin (FLOMAX)  0.4 MG CAPS capsule Take 0.4 mg by mouth daily after supper.     traMADol (ULTRAM) 50 MG tablet Take 50 mg by mouth 2 (two) times daily as needed for moderate pain (pain [name] 4-6).     No current facility-administered medications for this visit.    No Known Allergies  Review of Systems  Constitutional:  Negative for activity change, fatigue and unexpected weight change.  HENT:  Negative for trouble swallowing and voice change.   Eyes:  Negative for visual disturbance.  Respiratory:  Positive for shortness of breath and wheezing.   Cardiovascular:  Negative for chest pain and leg swelling.  Musculoskeletal:  Positive for back pain.  Hematological:  Bruises/bleeds easily.  All other systems reviewed and are negative.   BP 115/63   Pulse 61   Resp 20   Ht 5' 3 (1.6 m)   Wt 147 lb (66.7 kg)   SpO2 96% Comment: RA  BMI 26.04 kg/m  Physical Exam Vitals reviewed.  Constitutional:      General: He is not in acute distress.    Appearance: Normal appearance.  Eyes:     General: No scleral icterus.    Extraocular Movements: Extraocular movements intact.  Cardiovascular:  Rate and Rhythm: Normal rate and regular rhythm.     Heart sounds: Normal heart sounds. No murmur heard. Pulmonary:     Effort: Pulmonary effort is normal. No respiratory distress.     Breath sounds: Normal breath sounds. No wheezing or rales.  Abdominal:     General: There is no distension.     Palpations: Abdomen is soft.  Skin:    General: Skin is warm and dry.  Neurological:     General: No focal deficit present.     Mental Status: He is alert and oriented to person, place, and time.     Cranial Nerves: No cranial nerve deficit.     Motor: No weakness.     Diagnostic Tests: NUCLEAR MEDICINE PET SKULL BASE TO THIGH   TECHNIQUE: 7.22 mCi F-18 FDG was injected intravenously. Full-ring PET imaging was performed from the skull base to thigh after the radiotracer. CT data was obtained and used for  attenuation correction and anatomic localization.   Fasting blood glucose: 93 mg/dl   COMPARISON:  Chest CT 04/11/2024   FINDINGS: Mediastinal blood pool activity: SUV max 2.1   NECK:   No hypermetabolic cervical lymph nodes are identified. No suspicious activity identified within the pharyngeal mucosal space.   Incidental CT findings: Severe bilateral carotid atherosclerosis.   CHEST:   There are no hypermetabolic mediastinal, hilar or axillary lymph nodes. There is marked hypermetabolic activity within the previously demonstrated spiculated right lower lobe mass. This mass measures 3.4 x 2.9 cm on image 50/7 and has an SUV max of 16.2. Additional scattered pulmonary nodules are grossly stable, without additional hypermetabolic activity.   Incidental CT findings: Left subclavian pacemaker. Diffuse atherosclerosis of the aorta, great vessels and coronary arteries. Moderate centrilobular and paraseptal emphysema.   ABDOMEN/PELVIS:   There is no hypermetabolic activity within the liver, adrenal glands, spleen or pancreas. There is no hypermetabolic nodal activity in the abdomen or pelvis.   Incidental CT findings: Diffuse aortic and branch vessel atherosclerosis. Bilateral iliac and right femoral arterial stents.   SKELETON:   There is no hypermetabolic activity to suggest osseous metastatic disease.   Incidental CT findings: Right shoulder arthroplasty. Mild multilevel spondylosis.   IMPRESSION: 1. The previously demonstrated spiculated right lower lobe mass is hypermetabolic, consistent with primary bronchogenic carcinoma. 2. No evidence of metastatic disease. Additional scattered pulmonary nodules are grossly stable, without hypermetabolic activity. Although likely benign, these are suboptimally evaluated based on size. Attention on follow-up recommended. 3. Aortic Atherosclerosis (ICD10-I70.0) and Emphysema (ICD10-J43.9).     Electronically Signed   By:  Elsie Perone M.D.   On: 07/02/2024 14:27 I personally reviewed the PET/CT images.  Dominant right lower lobe lung nodule is markedly hypermetabolic.  No evidence of regional or distant metastatic disease.  Has numerous other pulmonary nodules which are not hypermetabolic but are too small to characterize.  Severe aortic and coronary atherosclerosis.  Porcelain ascending aorta.  ICD in place.  Pulmonary function testing FVC 3.57 (117%) FEV1 2.34 (109%) No change with bronchodilator DLCO 8.94 (45%)  Impression: Roy [name] is a 78 year old man with a history of tobacco abuse, emphysema, MI, left bundle branch block, congestive heart failure, ICD, ethanol abuse and withdrawal, cirrhosis, PAD, hypertension, hyperlipidemia, and thrombocytopenia.  Found to have a 3.1 cm spiculated right lower lobe mass on a low-dose CT for lung cancer screening.  Right lower lobe nodule-PET/CT showed the nodule is hypermetabolic.  No regional or distant metastatic disease.  Clinical stage is Ib (  T2a, N0).  We again reviewed options of surgical resection versus radiation for treatment of the mass.  His FEV1 is actually quite good and he did well with a 6-minute walk test.  However his diffusion capacity is borderline at 45% preoperatively.  There is a significant chance he could end up requiring oxygen afterwards.  Also high risk patient with his atherosclerotic cardiovascular disease, ongoing tobacco use and liver issues.  He favor surgery overall but is a little concerned about the possibility of needing oxygen.  Unable to reach a definitive decision today.  I offered to refer him to radiation oncology for further discussion with them.  He does not want to pursue that yet.  After discussion we decided to proceed with robotic bronchoscopy and endobronchial ultrasound for diagnostic and staging purposes.  I informed of the general nature of the procedure including the need for general anesthesia.  He understands it is  an endoscopic procedure that we will do on an outpatient basis.  I informed him of the indications, risks, benefits, and alternatives.  He does understand there is no guarantee of a definitive diagnosis.  He understands the risk include those associated with general anesthesia.  He understands the risks include, but not limited to death, MI, DVT, PE, bleeding, pneumothorax, as well as possibility of other procedural complications.  He will need to hold Brilinta  for 5 days prior to surgery.  I will check with Dr. Dolphus that.  Plan: Robotic bronchoscopy and endobronchial ultrasound on Thursday, 07/30/2024  I spent over 30 minutes in review of records, images, and in consultation with Roy [name] today. [name], MD [facility] [phone]

## 2024-07-10 ENCOUNTER — Encounter: Payer: Self-pay | Admitting: *Deleted

## 2024-07-10 ENCOUNTER — Other Ambulatory Visit: Payer: Self-pay | Admitting: *Deleted

## 2024-07-10 DIAGNOSIS — Z5189 Encounter for other specified aftercare: Secondary | ICD-10-CM

## 2024-07-10 DIAGNOSIS — R918 Other nonspecific abnormal finding of lung field: Secondary | ICD-10-CM

## 2024-07-11 ENCOUNTER — Other Ambulatory Visit: Payer: Self-pay | Admitting: Cardiology

## 2024-07-20 ENCOUNTER — Ambulatory Visit (INDEPENDENT_AMBULATORY_CARE_PROVIDER_SITE_OTHER): Admitting: Urology

## 2024-07-20 ENCOUNTER — Encounter: Payer: Self-pay | Admitting: Urology

## 2024-07-20 VITALS — BP 162/67 | HR 64

## 2024-07-20 DIAGNOSIS — N471 Phimosis: Secondary | ICD-10-CM | POA: Diagnosis not present

## 2024-07-20 LAB — URINALYSIS, ROUTINE W REFLEX MICROSCOPIC
Bilirubin, UA: NEGATIVE
Glucose, UA: NEGATIVE
Ketones, UA: NEGATIVE
Leukocytes,UA: NEGATIVE
Nitrite, UA: NEGATIVE
Protein,UA: NEGATIVE
RBC, UA: NEGATIVE
Specific Gravity, UA: 1.01 (ref 1.005–1.030)
Urobilinogen, Ur: 0.2 mg/dL (ref 0.2–1.0)
pH, UA: 6 (ref 5.0–7.5)

## 2024-07-20 MED ORDER — CLOTRIMAZOLE-BETAMETHASONE 1-0.05 % EX CREA: 1.0000 | TOPICAL_CREAM | Freq: Two times a day (BID) | CUTANEOUS | 3 refills | Status: DC

## 2024-07-20 NOTE — Progress Notes (Signed)
 07/20/2024 10:00 AM   Quintin LITTIE Clause 11-May-1946 [id number]  Referring provider: Shona Norleen PEDLAR, MD [address] [place],  [place] [address]  Penile pain   HPI: [name] is a 78yo here for evaluation of penile pain. Starting 1 year ago he noted difficulty retracting his foreskin and now the opening in his foreskin is tight and he has difficulty urinating. No burning or cracking of his foreskin. He has not applied any creams to his foreskin.    PMH: Past Medical History:  Diagnosis Date   AICD (automatic cardioverter/defibrillator) present 08/19/2015   St Jude BiV ICD for primary prevention by Dr. Waddell   Alcohol abuse    6 beers per day; hospital admission in 2009 for withdrawal symptoms   Anxiety and depression    denies    Cerebrovascular disease 2009   TIA; 2009- right ICA stent; re-intervention for restenosis complicated by [facility] w/o sx   CHF (congestive heart failure) (HCC) 06/02/2014   Chronic obstructive pulmonary disease (HCC)    Degenerative joint disease    Total shoulder arthroplasty-right   Hyperlipidemia    Hypertension    LBBB (left bundle branch block)    Normal echo-2011; stress nuclear in 09/2010--septal hypoperfusion representing nontransmural infarction or the effect of left bundle branch block, no ischemia   Lung cancer (HCC)    Myocardial infarction (HCC) 06/02/2014   Massive Heart Attack   Peripheral vascular disease (HCC)    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   Thrombocytopenia (HCC)    Tobacco abuse    -100 pack years; 1.5 packs per day   Traumatic seroma of thigh (HCC)    left   Tubular adenoma of colon     Surgical History: Past Surgical History:  Procedure Laterality Date   ABDOMINAL AORTAGRAM [name] 05/04/2014   Procedure: ABDOMINAL EZELLA;  Surgeon: Gaile LELON New, MD;  Location: [facility] CATH LAB;  Service: Cardiovascular;  Laterality: [name];   ABDOMINAL AORTOGRAM [name] 06/25/2017   Procedure: Abdominal Aortogram;  Surgeon:  New Gaile LELON, MD;  [facility] INVAS[facility] CV LAB;  Service: Cardiovascular;  Laterality: [name];   ABDOMINAL AORTOGRAM W/LOWER EXTREMITY [name] 04/22/2018   Procedure: ABDOMINAL AORTOGRAM W/LOWER EXTREMITY;  Surgeon: New Gaile LELON, MD;  [facility];  Service: Cardiovascular;  Laterality: [name];   ABDOMINAL AORTOGRAM W/LOWER EXTREMITY [name] 03/08/2020   Procedure: ABDOMINAL AORTOGRAM W/LOWER EXTREMITY;  Surgeon: New Gaile LELON, MD;  [facility];  Service: Cardiovascular;  Laterality: [name];   ABDOMINAL AORTOGRAM W/LOWER EXTREMITY Bilateral 08/23/2020   Procedure: ABDOMINAL AORTOGRAM W/LOWER EXTREMITY;  Surgeon: New Gaile LELON, MD;  [facility];  Service: Cardiovascular;  Laterality: Bilateral;   ABDOMINAL AORTOGRAM W/LOWER EXTREMITY [name] 10/31/2021   Procedure: ABDOMINAL AORTOGRAM W/LOWER EXTREMITY;  Surgeon: New Gaile LELON, MD;  [facility];  Service: Cardiovascular;  Laterality: [name];   ABDOMINAL AORTOGRAM W/LOWER EXTREMITY [name] 12/19/2021   Procedure: ABDOMINAL AORTOGRAM W/LOWER EXTREMITY;  Surgeon: New Gaile LELON, MD;  [facility];  Service: Cardiovascular;  Laterality: [name];   ABDOMINAL AORTOGRAM W/LOWER EXTREMITY Bilateral 02/19/2023   Procedure: ABDOMINAL AORTOGRAM W/LOWER EXTREMITY;  Surgeon: New Gaile LELON, MD;  [facility];  Service: Cardiovascular;  Laterality: Bilateral;   AGILE CAPSULE [name] 03/18/2014   Procedure: AGILE CAPSULE;  Surgeon: Margo LITTIE Haddock, MD;  [facility]: [facility];  Service: Endoscopy;  Laterality: [name];  7:30   APPLICATION OF WOUND VAC Left  09/03/2017   Procedure: APPLICATION OF WOUND VAC;  Surgeon: Laurence Redell CROME, MD;  Location[facility] OR;  Service: Vascular;  Laterality: Left;   APPLICATION OF WOUND VAC Right 04/07/2020   Procedure: PLACEMENT OF ANTIBIOTIC BEADS AND APPLICATION OF WOUND VAC;  Surgeon: Serene Gaile ORN, MD;  [facility] OR;  Service: Vascular;  Laterality: Right;    BACK SURGERY     BACTERIAL OVERGROWTH TEST [name] 05/24/2015   Procedure: BACTERIAL OVERGROWTH TEST;  Surgeon: Margo CROME Haddock, MD;  [facility];  Service: Endoscopy;  Laterality: [name];  0700   BI-VENTRICULAR IMPLANTABLE CARDIOVERTER DEFIBRILLATOR  (CRT-D)  08/19/2015   BIV ICD GENERATOR CHANGEOUT [name] 11/11/2023   Procedure: BIV ICD GENERATOR CHANGEOUT;  Surgeon: Waddell Danelle ORN, MD;  Location: [facility] INVASIVE CV LAB;  Service: Cardiovascular;  Laterality: [name];   CARPAL TUNNEL RELEASE Left 02/02/2016   Procedure: LEFT CARPAL TUNNEL RELEASE;  Surgeon: Arley Curia, MD;  Location: [facility];  Service: Orthopedics;  Laterality: Left;  ANESTHESIA: IV REGIONAL UPPER ARM   CATARACT EXTRACTION W/PHACO Right 03/08/2015   Procedure: CATARACT EXTRACTION PHACO AND INTRAOCULAR LENS PLACEMENT (IOC);  Surgeon: Oneil Platts, MD;  Location: [facility] ORS;  Service: Ophthalmology;  Laterality: Right;  CDE:9.46   CATARACT EXTRACTION W/PHACO Left 03/22/2015   Procedure: CATARACT EXTRACTION PHACO AND INTRAOCULAR LENS PLACEMENT (IOC);  Surgeon: Oneil Platts, MD;  Location: [facility];  Service: Ophthalmology;  Laterality: Left;  CDE:5.80   COLONOSCOPY  08/22/09   Fields-(Tubular Adenoma)3-mm transverse polyp/4-mm polyp otherwise noraml/small internal hemorrhoids   COLONOSCOPY [name] 04/14/2014   DOQ:dfjoo internal hemorrhids/normal mocsa in the terminal iluem/left colonis redundant   COLONOSCOPY [name] 07/01/2024   Procedure: COLONOSCOPY;  Surgeon: Shaaron Lamar HERO, MD;  [facility];  Service: Endoscopy;  Laterality: [name];  900am, asa 3   COLONOSCOPY W/ POLYPECTOMY  2011   ENDARTERECTOMY FEMORAL Left 08/16/2017   Procedure: ENDARTERECTOMY LEFT PROFUNDA FEMORAL;  Surgeon: Serene Gaile ORN, MD;  Location: [facility] OR;  Service: Vascular;  Laterality: Left;   ENDARTERECTOMY FEMORAL Right 03/31/2020   Procedure: RIGHT FEMORAL ENDARTERECTOMY;  Surgeon: Serene Gaile ORN, MD;  [facility];  Service: Vascular;  Laterality:  Right;   EP IMPLANTABLE DEVICE [name] 08/19/2015   Procedure: BiV ICD Insertion CRT-D;  Surgeon: Danelle ORN Waddell, MD;  Location: [facility] INVASIVE CV LAB;  Service: Cardiovascular;  Laterality: [name];   ESOPHAGOGASTRODUODENOSCOPY [name] 03/05/2014   SLF: 1. Stricture at the gastroesophagael junction 2. Small hiatal hernia 3. Moderate non-erosive gastritis and duodentitis. 4. No surce for Melena identified.    FEMORAL-POPLITEAL BYPASS GRAFT Left 08/16/2017   Procedure: LEFT  FEMORAL-POPLITEAL ARTERY  BYPASS GRAFT;  Surgeon: Serene Gaile ORN, MD;  [facility];  Service: Vascular;  Laterality: Left;   GIVENS CAPSULE STUDY [name] 03/30/2014   Procedure: GIVENS CAPSULE STUDY;  Surgeon: Margo CROME Haddock, MD;  [facility];  Service: Endoscopy;  Laterality: [name];  7:30   GROIN DEBRIDEMENT Right 04/07/2020   Procedure: INCISION AND DRAINAGE OF RIGHT GROIN;  Surgeon: Serene Gaile ORN, MD;  [facility];  Service: Vascular;  Laterality: Right;   I & D EXTREMITY Left 09/03/2017   Procedure: IRRIGATION AND DEBRIDEMENT EXTREMITY LEFT THIGH SEROMA;  Surgeon: Laurence Redell CROME, MD;  Location: [facility] OR;  Service: Vascular;  Laterality: Left;   INSERT / REPLACE / REMOVE PACEMAKER     INSERTION OF ILIAC STENT  03/31/2020   Procedure: INSERTION OF RIGHT ILIAC ARTERY STENT AND RIGHT SUPERFICIAL  FEMORAL ARTERY STENT;  Surgeon: Serene Gaile ORN, MD;  [facility];  Service: Vascular;;   IR ANGIO INTRA EXTRACRAN SEL COM CAROTID INNOMINATE BILAT MOD SED  07/16/2017   IR ANGIO INTRA EXTRACRAN SEL COM CAROTID INNOMINATE BILAT MOD SED  06/12/2019   IR ANGIO INTRA EXTRACRAN SEL COM CAROTID INNOMINATE BILAT MOD SED  01/17/2021   IR ANGIO VERTEBRAL SEL SUBCLAVIAN INNOMINATE BILAT MOD SED  07/16/2017   IR ANGIO VERTEBRAL SEL SUBCLAVIAN INNOMINATE BILAT MOD SED  06/12/2019   IR ANGIO VERTEBRAL SEL SUBCLAVIAN INNOMINATE BILAT MOD SED  01/17/2021   IR RADIOLOGIST EVAL & MGMT  04/01/2017   IR RADIOLOGIST EVAL & MGMT  08/02/2017   IR TRANSCATH EXCRAN  VERT OR CAR A STENT  07/22/2019   IR US  GUIDE VASC ACCESS RIGHT  06/12/2019   IR US  GUIDE VASC ACCESS RIGHT  01/17/2021   JOINT REPLACEMENT Right    Total Shoulder Replacement    LOWER EXTREMITY ANGIOGRAPHY Bilateral 06/25/2017   Procedure: Lower Extremity Angiography;  Surgeon: Serene Gaile ORN, MD;  [facility];  Service: Cardiovascular;  Laterality: Bilateral;   LUMBAR FUSION  2010   PATCH ANGIOPLASTY Right 03/31/2020   Procedure: PATCH ANGIOPLASTY USING XENOSURE BIOLOGIC PATCH 1cm x 14cm;  Surgeon: Serene Gaile ORN, MD;  Location: [facility] OR;  Service: Vascular;  Laterality: Right;   PERIPHERAL VASCULAR ATHERECTOMY Right 04/22/2018   Procedure: PERIPHERAL VASCULAR ATHERECTOMY;  Surgeon: Serene Gaile ORN, MD;  [facility];  Service: Cardiovascular;  Laterality: Right;  right superficial femoral   PERIPHERAL VASCULAR BALLOON ANGIOPLASTY Right 04/22/2018   Procedure: PERIPHERAL VASCULAR BALLOON ANGIOPLASTY;  Surgeon: Serene Gaile ORN, MD;  [facility];  Service: Cardiovascular;  Laterality: Right;  external iliac   PERIPHERAL VASCULAR BALLOON ANGIOPLASTY Left 03/08/2020   Procedure: PERIPHERAL VASCULAR BALLOON ANGIOPLASTY;  Surgeon: Serene Gaile ORN, MD;  [facility];  Service: Cardiovascular;  Laterality: Left;  Common Femoral   PERIPHERAL VASCULAR BALLOON ANGIOPLASTY Left 08/23/2020   Procedure: PERIPHERAL VASCULAR BALLOON ANGIOPLASTY;  Surgeon: Serene Gaile ORN, MD;  [facility];  Service: Cardiovascular;  Laterality: Left;  common femoral   PERIPHERAL VASCULAR BALLOON ANGIOPLASTY Left 10/31/2021   Procedure: PERIPHERAL VASCULAR BALLOON ANGIOPLASTY;  Surgeon: Serene Gaile ORN, MD;  [facility];  Service: Cardiovascular;  Laterality: Left;  external iliac   PERIPHERAL VASCULAR INTERVENTION Right 03/08/2020   Procedure: PERIPHERAL VASCULAR INTERVENTION;  Surgeon: Serene Gaile ORN, MD;  [facility]  [facility];  Service: Cardiovascular;  Laterality: Right;  SFA   PERIPHERAL VASCULAR INTERVENTION Right 08/23/2020   Procedure: PERIPHERAL VASCULAR INTERVENTION;  Surgeon: Serene Gaile ORN, MD;  [facility];  Service: Cardiovascular;  Laterality: Right;  external iliac   PERIPHERAL VASCULAR INTERVENTION Right 10/31/2021   Procedure: PERIPHERAL VASCULAR INTERVENTION;  Surgeon: Serene Gaile ORN, MD;  [facility];  Service: Cardiovascular;  Laterality: Right;  superficial femoral artery   PERIPHERAL VASCULAR INTERVENTION Left 12/19/2021   Procedure: PERIPHERAL VASCULAR INTERVENTION;  Surgeon: Serene Gaile ORN, MD;  [facility];  Service: Cardiovascular;  Laterality: Left;  stent lt ext iliac / pta common femoral   PERIPHERAL VASCULAR INTERVENTION  02/19/2023   Procedure: PERIPHERAL VASCULAR INTERVENTION;  Surgeon: Serene Gaile ORN, MD;  [facility];  Service: Cardiovascular;;   RADIOLOGY WITH ANESTHESIA [name] 07/01/2019   Procedure: STENTING;  Surgeon: Dolphus Carrion, MD;  [facility];  Service: Radiology;  Laterality: [name];   RADIOLOGY WITH ANESTHESIA [name] 07/22/2019   Procedure: STENTING;  Surgeon: Dolphus Carrion, MD;  [facility] OR;  Service: Radiology;  Laterality: [name];   TOTAL SHOULDER ARTHROPLASTY Right 2011   Dr. Dozier COWBOY NERVE TRANSPOSITION Left 02/02/2016   Procedure: LEFT IN-SITU DECOMPRESSION ULNAR NERVE ;  Surgeon: Arley Curia, MD;  Location: [facility];  Service: Orthopedics;  Laterality: Left;   ULNAR TUNNEL RELEASE Left 02/02/2016   Procedure: LEFT CUBITAL TUNNEL RELEASE;  Surgeon: Arley Curia, MD;  Location: [facility];  Service: Orthopedics;  Laterality: Left;   VASECTOMY  1971   VEIN HARVEST Left 08/16/2017   Procedure: USING NON REVERSE LEFT GREATER SAPHENOUS VEIN HARVEST;  Surgeon: Serene Gaile ORN, MD;  [facility] OR;  Service: Vascular;  Laterality: Left;    Home Medications:   Allergies as of 07/20/2024   No Known Allergies      Medication List        Accurate as of July 20, 2024 10:00 AM. If you have any questions, ask your nurse or doctor.          aspirin  EC 81 MG tablet Take 81 mg by mouth every evening.   atorvastatin  20 MG tablet Commonly known as: LIPITOR  Take 20 mg by mouth every evening.   Brilinta  90 MG Tabs tablet Generic drug: ticagrelor  Take 90 mg by mouth 2 (two) times daily.   carvedilol  6.25 MG tablet Commonly known as: COREG  TAKE 1 TABLET TWICE A DAY WITH MEALS (DOSE CHANGE, NEW PRESCRIPTION)   CENTRUM SILVER PO Take 1 tablet by mouth daily. 50+   cetirizine 10 MG tablet Commonly known as: ZYRTEC Take 10 mg by mouth daily.   CLEAR EYES OP Place 1 drop into both eyes daily as needed (dry eyes).   dextromethorphan -guaiFENesin  30-600 MG 12hr tablet Commonly known as: MUCINEX  DM Take 1 tablet by mouth 2 (two) times daily.   Fish Oil  1000 MG Caps Take 1,000 mg by mouth every evening.   gabapentin  300 MG capsule Commonly known as: NEURONTIN  Take 300 mg by mouth 2 (two) times daily.   Gold Bond Psoriasis Relief 3 % Crea Generic drug: Salicylic Acid Apply 1 application topically daily as needed (psoriasis).   HYDROcodone -acetaminophen  5-325 MG tablet Commonly known as: NORCO/VICODIN Take 1-2 tablets by mouth See admin instructions. Take 2 tablets in the morning and evening, may take 1 tablet at lunch as needed for pain   Magnesium  250 MG Tabs Take 250 mg by mouth every evening.   Neuriva Plus Caps Take 1 capsule by mouth daily.   sacubitril -valsartan  97-103 MG Commonly known as: Entresto  Take 1 tablet by mouth 2 (two) times daily.   silodosin 8 MG Caps capsule Commonly known as: RAPAFLO Take 8 mg by mouth daily with breakfast.   SUPER B COMPLEX/C PO Take 1 tablet by mouth every evening.   tamsulosin  0.4 MG Caps capsule Commonly known as: FLOMAX  Take 0.4 mg by mouth daily after supper.   traMADol 50  MG tablet Commonly known as: ULTRAM Take 50 mg by mouth 2 (two) times daily as needed for moderate pain (pain score 4-6).        Allergies: No Known Allergies  Family History: Family History  Problem Relation Age of Onset   Coronary artery disease Mother    Diabetes Mother    Heart disease Mother        Before age 68 -  5 Bypasses   Hypertension Mother    Heart attack Mother        3-4 Heart attacks   Alzheimer's disease Father    Diabetes Father    Heart attack Sister    Cancer Brother        Prostate   Hyperlipidemia Son    Prostate cancer Brother    Alzheimer's disease Sister    Colon cancer Neg Hx     Social History:  reports that he has been smoking cigarettes. He started smoking about 66 years ago. He has a 66.9 pack-year smoking history. He has been exposed to tobacco smoke. He has never used smokeless tobacco. He reports that he does not drink alcohol and does not use drugs.  ROS: All other review of systems were reviewed and are negative except what is noted above in HPI  Physical Exam: BP (!) 162/67   Pulse 64   Constitutional:  Alert and oriented, No acute distress. HEENT: [place] AT, moist mucus membranes.  Trachea midline, no masses. Cardiovascular: No clubbing, cyanosis, or edema. Respiratory: Normal respiratory effort, no increased work of breathing. GI: Abdomen is soft, nontender, nondistended, no abdominal masses GU: No CVA tenderness. Uncircumcised phallus. Balanitis present. No masses/lesions on penis, testis, scrotum.  Lymph: No cervical or inguinal lymphadenopathy. Skin: No rashes, bruises or suspicious lesions. Neurologic: Grossly intact, no focal deficits, moving all 4 extremities. Psychiatric: Normal mood and affect.  Laboratory Data: Lab Results  Component Value Date   WBC 7.6 11/04/2023   HGB 13.6 11/04/2023   HCT 40.5 11/04/2023   MCV 94 11/04/2023   PLT 260 11/04/2023    Lab Results  Component Value Date   CREATININE 0.68 (L)  11/05/2023    Lab Results  Component Value Date   PSA 1.37 01/08/2012   PSA 1.02 Test Methodology: [employer] PSA 10/06/2008    No results found for: TESTOSTERONE  Lab Results  Component Value Date   HGBA1C (H) 10/18/2010    5.9 (NOTE)                                                                       According to the ADA Clinical Practice Recommendations for 2011, when HbA1c is used as a screening test:   >=6.5%   Diagnostic of Diabetes Mellitus           (if abnormal result  is confirmed)  5.7-6.4%   Increased risk of developing Diabetes Mellitus  References:Diagnosis and Classification of Diabetes Mellitus,Diabetes Care,2011,34(Suppl 1):S62-S69 and Standards of Medical Care in         Diabetes - 2011,Diabetes Care,2011,34  (Suppl 1):S11-S61.    Urinalysis    Component Value Date/Time   COLORURINE YELLOW 04/06/2020 1822   APPEARANCEUR CLEAR 04/06/2020 1822   LABSPEC 1.008 04/06/2020 1822   PHURINE 7.0 04/06/2020 1822   GLUCOSEU NEGATIVE 04/06/2020 1822   HGBUR NEGATIVE 04/06/2020 1822   BILIRUBINUR NEGATIVE 04/06/2020 1822   KETONESUR NEGATIVE 04/06/2020 1822   PROTEINUR NEGATIVE 04/06/2020 1822   UROBILINOGEN 0.2 06/10/2014 0830   NITRITE NEGATIVE 04/06/2020 1822   LEUKOCYTESUR NEGATIVE 04/06/2020 1822    Lab Results  Component Value Date   BACTERIA NONE SEEN 07/22/2019    Pertinent Imaging:  Results for  orders placed during the hospital encounter of 03/18/14  DG Abd 1 View - KUB  Narrative CLINICAL DATA:  Agile capsule follow up.  EXAM: ABDOMEN - 1 VIEW  COMPARISON:  Abdominal CTA 02/17/2014.  FINDINGS: The bowel gas pattern is normal. Rectangular radiodensity in the left upper quadrant is just below the hemidiaphragm and is likely within the splenic flexure of the colon, although could be in the gastric fundus. Lower lumbar spondylosis is noted status post L4-5 fusion. Aortoiliac atherosclerosis is noted.  IMPRESSION: Capsule is in the left  subphrenic region.   Electronically Signed By: Zell Perone M.D. On: 03/19/2014 09:36  No results found for this or any previous visit.  No results found for this or any previous visit.  No results found for this or any previous visit.  Results for orders placed during the hospital encounter of 12/04/21  US  RENAL  Narrative CLINICAL DATA:  Flank pain  Difficulty urinating  Cirrhosis  CHF  Hypertension  EXAM: RENAL / URINARY TRACT ULTRASOUND COMPLETE  COMPARISON:  1122  FINDINGS: Right Kidney:  Renal measurements: 12.2 x 4.3 x 4.9 cm = volume: 132 mL. Echogenicity within normal limits. No mass or hydronephrosis visualized.  Left Kidney:  Renal measurements: 11.4 x 4.8 x 5.0 cm = volume: 143 mL. Echogenicity within normal limits. No mass or hydronephrosis visualized.  Bladder:  Small amount of layering debris noted within the bladder. Bladder otherwise normal appearance.  Other:  None.  IMPRESSION: 1. No significant sonographic abnormality of the kidneys. 2. Small amount of layering debris noted within the bladder. Please correlate for possibility of cystitis.   Electronically Signed By: Aliene Lloyd M.D. On: 12/04/2021 09:20  No results found for this or any previous visit.  No results found for this or any previous visit.  No results found for this or any previous visit.   Assessment & Plan:    1. Phimosis (Primary) -We will trial clotrimazole  BID for 4 weeks, If this fails to improve his balanitis we will proceed with circumcision - Urinalysis, Routine w reflex microscopic   No follow-ups on file.  Belvie Clara, MD  [facility] [place]

## 2024-07-20 NOTE — Patient Instructions (Signed)
 Balanitis  Balanitis is swelling and irritation of the head of the penis (glans penis). Balanitis occurs most often among males who have not had their foreskin removed (uncircumcised). In uncircumcised males, the condition may also cause inflammation of the skin around the foreskin. Balanitis sometimes causes scarring of the penis or foreskin, which can require surgery. This condition may develop because of an infection or another medical condition. Untreated balanitis can increase the risk of penile cancer. What are the causes? Common causes of this condition include: Irritation and lack of airflow due to fluid (smegma) that can build up on the glans penis. Poor personal hygiene, especially in uncircumcised males. Not cleaning the glans penis and foreskin well can result in a buildup of bacteria, viruses, and yeast, which can lead to infection and inflammation. Other causes include: Chemical irritation from products such as soaps or shower gels, especially those that have fragrance. Chemical irritation can also be caused by condoms, personal lubricants, petroleum jelly, spermicides, fabric softeners, or laundry detergents. Skin conditions, such as eczema, dermatitis, and psoriasis. Allergies to medicines, such as tetracycline and sulfa drugs. What increases the risk? The following factors may make you more likely to develop this condition: Being an uncircumcised male. Having diabetes. Having other medical conditions, including liver cirrhosis, congestive heart failure, or kidney disease. Having infections, such as candidiasis, HPV (human papillomavirus), herpes simplex, gonorrhea, or syphilis. Having a tight foreskin that is difficult to pull back (retract) past the glans penis. Being severely obese. History of reactive arthritis. What are the signs or symptoms? Symptoms of this condition include: Discharge from under the foreskin, and pain or difficulty retracting the foreskin. A bad smell  or itchiness on the penis. Tenderness, redness, and swelling of the glans penis. A rash or sores on the glans penis or foreskin. Inability to get an erection due to pain. Trouble urinating. Scarring of the penis or foreskin, in some cases. How is this diagnosed? This condition may be diagnosed based on a physical exam and tests of a swab of discharge to check for bacterial or fungal infection. You may also have blood tests to check for: Viruses that can cause balanitis. A high blood sugar (glucose) level. This could be a sign of diabetes, which can increase the risk of balanitis. How is this treated? Treatment for this condition depends on the cause. Treatment may include: Improving personal hygiene. Your health care provider may recommend sitting in a bath of warm water that is deep enough to cover your hips and buttocks (sitz bath). Medicines such as: Creams or ointments to reduce swelling (steroids) or to treat an infection. Antibiotic medicine. Antifungal medicine. Having surgery to remove or cut the foreskin (circumcision). This may be done if you have scarring on the foreskin that makes it difficult to retract. Controlling other medical problems that may be causing your condition or making it worse. Follow these instructions at home: Medicines Take over-the-counter and prescription medicines only as told by your health care provider. If you were prescribed an antibiotic medicine, use it as told by your health care provider. Do not stop using the antibiotic even if you start to feel better. General instructions Do not have sex until the condition clears up, or until your health care provider approves. Keep your penis clean and dry. Take sitz baths as recommended by your health care provider. Avoid products that irritate your skin or make symptoms worse, such as soaps and shower gels that have fragrance. Keep all follow-up visits. This is  important. Contact a health care provider  if: Your symptoms get worse or do not improve with home care. You develop chills or a fever. You have trouble urinating. You cannot retract your foreskin. Get help right away if: You develop severe pain. You are unable to urinate. Summary Balanitis is swelling and irritation of the head of the penis (glans penis). This condition is most common among uncircumcised males. Balanitis causes pain, redness, and swelling of the glans penis. Good personal hygiene is important. Treatment may include improving personal hygiene and applying creams or ointments. Contact a health care provider if your symptoms get worse or do not improve with home care. This information is not intended to replace advice given to you by your health care provider. Make sure you discuss any questions you have with your health care provider. Document Revised: 04/26/2021 Document Reviewed: 04/26/2021 Elsevier Patient Education  2024 [employer].

## 2024-07-21 ENCOUNTER — Ambulatory Visit: Attending: Nurse Practitioner

## 2024-07-21 ENCOUNTER — Ambulatory Visit (INDEPENDENT_AMBULATORY_CARE_PROVIDER_SITE_OTHER)

## 2024-07-21 DIAGNOSIS — I340 Nonrheumatic mitral (valve) insufficiency: Secondary | ICD-10-CM | POA: Insufficient documentation

## 2024-07-21 DIAGNOSIS — F17210 Nicotine dependence, cigarettes, uncomplicated: Secondary | ICD-10-CM | POA: Insufficient documentation

## 2024-07-21 DIAGNOSIS — E785 Hyperlipidemia, unspecified: Secondary | ICD-10-CM | POA: Diagnosis not present

## 2024-07-21 DIAGNOSIS — I252 Old myocardial infarction: Secondary | ICD-10-CM | POA: Diagnosis not present

## 2024-07-21 DIAGNOSIS — R0989 Other specified symptoms and signs involving the circulatory and respiratory systems: Secondary | ICD-10-CM

## 2024-07-21 DIAGNOSIS — I110 Hypertensive heart disease with heart failure: Secondary | ICD-10-CM | POA: Insufficient documentation

## 2024-07-21 DIAGNOSIS — I77819 Aortic ectasia, unspecified site: Secondary | ICD-10-CM | POA: Diagnosis not present

## 2024-07-21 DIAGNOSIS — I739 Peripheral vascular disease, unspecified: Secondary | ICD-10-CM | POA: Diagnosis not present

## 2024-07-21 DIAGNOSIS — J449 Chronic obstructive pulmonary disease, unspecified: Secondary | ICD-10-CM | POA: Diagnosis not present

## 2024-07-21 DIAGNOSIS — Z950 Presence of cardiac pacemaker: Secondary | ICD-10-CM | POA: Insufficient documentation

## 2024-07-21 DIAGNOSIS — I779 Disorder of arteries and arterioles, unspecified: Secondary | ICD-10-CM | POA: Insufficient documentation

## 2024-07-21 DIAGNOSIS — I2510 Atherosclerotic heart disease of native coronary artery without angina pectoris: Secondary | ICD-10-CM | POA: Diagnosis not present

## 2024-07-21 DIAGNOSIS — I6523 Occlusion and stenosis of bilateral carotid arteries: Secondary | ICD-10-CM | POA: Diagnosis not present

## 2024-07-21 DIAGNOSIS — I509 Heart failure, unspecified: Secondary | ICD-10-CM | POA: Diagnosis not present

## 2024-07-22 LAB — ECHOCARDIOGRAM COMPLETE
AR max vel: 2.73 cm2
AV Area VTI: 2.44 cm2
AV Area mean vel: 2.6 cm2
AV Mean grad: 3 mmHg
AV Peak grad: 6 mmHg
Ao pk vel: 1.22 m/s
Area-P 1/2: 3.93 cm2
Calc EF: 63.8 %
MV VTI: 1.72 cm2
S' Lateral: 2.3 cm
Single Plane A2C EF: 65.2 %
Single Plane A4C EF: 62.7 %

## 2024-07-23 NOTE — Progress Notes (Signed)
 Remote ICD transmission.

## 2024-07-24 ENCOUNTER — Encounter: Payer: Self-pay | Admitting: Internal Medicine

## 2024-07-24 NOTE — Progress Notes (Signed)
 PERIOPERATIVE PRESCRIPTION FOR IMPLANTED CARDIAC DEVICE PROGRAMMING  Patient Information: Name:  Roy [name]  DOB:  [birth year]  MRN:  [id number]  Planned Procedure:  Robotic Bronchoscopy with endobronchial ultrasound  Surgeon:  Dr. Elspeth Millers  Date of Procedure:  Thursday, September 4th.  Cautery will be used.  Position during surgery:  supine   Please send documentation back to:  Roy [name] (Fax # [phone])  Device Information:  Clinic EP Physician:  Danelle Birmingham, MD   Device Type:  Defibrillator Manufacturer and Phone #:  [employer]: [phone] Pacemaker Dependent?:  No. Date of Last Device Check:  05/12/24 Normal Device Function?:  Yes.    Electrophysiologist's Recommendations:  Have magnet available. Provide continuous ECG monitoring when magnet is used or reprogramming is to be performed.  Procedure will likely interfere with device function.  Device should be programmed:  Asynchronous pacing during procedure and returned to normal programming after procedure  Per Device Clinic Standing Orders, Prentice JINNY Silvan, RN  5:20 PM 07/24/2024

## 2024-07-24 NOTE — Progress Notes (Signed)
 Surgical Instructions   Your procedure is scheduled on Thursday, September 4th. Report to [facility] Main Entrance A at 5:30 A.M., then check in with the Admitting office. Any questions or running late day of surgery: call [phone]  Questions prior to your surgery date: call [phone], Monday-Friday, 8am-4pm. If you experience any cold or flu symptoms such as cough, fever, chills, shortness of breath, etc. between now and your scheduled surgery, please notify us  at the above number.     Remember:  Do not eat or drink after midnight the night before your surgery   Take these medicines the morning of surgery with A SIP OF WATER   carvedilol  (COREG )  cetirizine (ZYRTEC)  gabapentin  (NEURONTIN )  tamsulosin  (FLOMAX )    May take these medicines IF NEEDED: HYDROcodone -acetaminophen  (NORCO/VICODIN)  Naphazoline HCl (CLEAR EYES ) eye drops  traMADol (ULTRAM)    Per your physician's instruction's, HOLD your BRILINTA  and FISH OIL  for 5 days prior to surgery. Last dose on Friday, August 29th.  Per your physician's instruction's, STOP your sacubitril -valsartan  (ENTRESTO ) 3 day's prior to surgery.  Last dose on Sunday, August 31st.   One week prior to surgery, STOP taking any Aspirin  (unless otherwise instructed by your surgeon) Aleve , Naproxen , Ibuprofen, Motrin, Advil, Goody's, BC's, all herbal medications, fish oil , and non-prescription vitamins.                     Do NOT Smoke (Tobacco/Vaping) for 24 hours prior to your procedure.  If you use a CPAP at night, you may bring your mask/headgear for your overnight stay.   You will be asked to remove any contacts, glasses, piercing's, hearing aid's, dentures/partials prior to surgery. Please bring cases for these items if needed.    Patients discharged the day of surgery will not be allowed to drive home, and someone needs to stay with them for 24 hours.  SURGICAL WAITING ROOM VISITATION Patients may have no more than 2 support  people in the waiting area - these visitors may rotate.   Pre-op nurse will coordinate an appropriate time for 1 ADULT support person, who may not rotate, to accompany patient in pre-op.  Children under the age of 53 must have an adult with them who is not the patient and must remain in the main waiting area with an adult.  If the patient needs to stay at the hospital during part of their recovery, the visitor guidelines for inpatient rooms apply.  Please refer to the [facility] website for the visitor guidelines for any additional information.   If you received a COVID test during your pre-op visit  it is requested that you wear a mask when out in public, stay away from anyone that may not be feeling well and notify your surgeon if you develop symptoms. If you have been in contact with anyone that has tested positive in the last 10 days please notify you surgeon.      Pre-operative CHG Bathing Instructions   You can play a key role in reducing the risk of infection after surgery. Your skin needs to be as free of germs as possible. You can reduce the number of germs on your skin by washing with CHG (chlorhexidine  gluconate) soap before surgery. CHG is an antiseptic soap that kills germs and continues to kill germs even after washing.   DO NOT use if you have an allergy to chlorhexidine /CHG or antibacterial soaps. If your skin becomes reddened or irritated, stop using the CHG and  notify one of our RNs at [phone].              TAKE A SHOWER THE NIGHT BEFORE SURGERY AND THE DAY OF SURGERY    Please keep in mind the following:  DO NOT shave, including legs and underarms, 48 hours prior to surgery.   You may shave your face before/day of surgery.  Place clean sheets on your bed the night before surgery Use a clean washcloth (not used since being washed) for each shower. DO NOT sleep with pet's night before surgery.  CHG Shower Instructions:  Wash your face and private area with normal  soap. If you choose to wash your hair, wash first with your normal shampoo.  After you use shampoo/soap, rinse your hair and body thoroughly to remove shampoo/soap residue.  Turn the water  OFF and apply half the bottle of CHG soap to a CLEAN washcloth.  Apply CHG soap ONLY FROM YOUR NECK DOWN TO YOUR TOES (washing for 3-5 minutes)  DO NOT use CHG soap on face, private areas, open wounds, or sores.  Pay special attention to the area where your surgery is being performed.  If you are having back surgery, having someone wash your back for you may be helpful. Wait 2 minutes after CHG soap is applied, then you may rinse off the CHG soap.  Pat dry with a clean towel  Put on clean pajamas    Additional instructions for the day of surgery: DO NOT APPLY any lotions, deodorants, cologne, or perfumes.   Do not wear jewelry or makeup Do not wear nail polish, gel polish, artificial nails, or any other type of covering on natural nails (fingers and toes) Do not bring valuables to the hospital. [facility] is not responsible for valuables/personal belongings. Put on clean/comfortable clothes.  Please brush your teeth.  Ask your nurse before applying any prescription medications to the skin.

## 2024-07-24 NOTE — Progress Notes (Addendum)
 Perioperative orders have been requested and Rep Redell Regal has been notified.

## 2024-07-28 ENCOUNTER — Encounter (HOSPITAL_COMMUNITY): Payer: Self-pay

## 2024-07-28 ENCOUNTER — Other Ambulatory Visit: Payer: Self-pay

## 2024-07-28 ENCOUNTER — Ambulatory Visit (HOSPITAL_COMMUNITY)
Admission: RE | Admit: 2024-07-28 | Discharge: 2024-07-28 | Disposition: A | Discharge status: Home / Self Care | Source: Ambulatory Visit | Attending: Thoracic Surgery (Cardiothoracic Vascular Surgery) | Admitting: Thoracic Surgery (Cardiothoracic Vascular Surgery)

## 2024-07-28 ENCOUNTER — Encounter (HOSPITAL_COMMUNITY)
Admission: RE | Admit: 2024-07-28 | Discharge: 2024-07-28 | Disposition: A | Discharge status: Home / Self Care | Source: Ambulatory Visit | Attending: Thoracic Surgery (Cardiothoracic Vascular Surgery) | Admitting: Thoracic Surgery (Cardiothoracic Vascular Surgery)

## 2024-07-28 ENCOUNTER — Ambulatory Visit: Payer: Self-pay | Admitting: Nurse Practitioner

## 2024-07-28 VITALS — BP 157/67 | HR 65 | Temp 97.9°F | Resp 18 | Ht 63.0 in | Wt 142.0 lb

## 2024-07-28 DIAGNOSIS — Z01818 Encounter for other preprocedural examination: Secondary | ICD-10-CM

## 2024-07-28 DIAGNOSIS — Z5189 Encounter for other specified aftercare: Secondary | ICD-10-CM | POA: Diagnosis not present

## 2024-07-28 DIAGNOSIS — R918 Other nonspecific abnormal finding of lung field: Secondary | ICD-10-CM | POA: Diagnosis not present

## 2024-07-28 DIAGNOSIS — R911 Solitary pulmonary nodule: Secondary | ICD-10-CM | POA: Diagnosis not present

## 2024-07-28 DIAGNOSIS — J439 Emphysema, unspecified: Secondary | ICD-10-CM | POA: Diagnosis not present

## 2024-07-28 LAB — CBC
HCT: 40.3 % (ref 39.0–52.0)
Hemoglobin: 13.4 g/dL (ref 13.0–17.0)
MCH: 30.6 pg (ref 26.0–34.0)
MCHC: 33.3 g/dL (ref 30.0–36.0)
MCV: 92 fL (ref 80.0–100.0)
Platelets: 343 K/uL (ref 150–400)
RBC: 4.38 MIL/uL (ref 4.22–5.81)
RDW: 12.3 % (ref 11.5–15.5)
WBC: 9.3 K/uL (ref 4.0–10.5)
nRBC: 0 % (ref 0.0–0.2)

## 2024-07-28 LAB — SURGICAL PCR SCREEN
MRSA, PCR: NEGATIVE
Staphylococcus aureus: NEGATIVE

## 2024-07-28 LAB — COMPREHENSIVE METABOLIC PANEL WITH GFR
ALT: 17 U/L (ref 0–44)
AST: 22 U/L (ref 15–41)
Albumin: 3.8 g/dL (ref 3.5–5.0)
Alkaline Phosphatase: 104 U/L (ref 38–126)
Anion gap: 10 (ref 5–15)
BUN: 9 mg/dL (ref 8–23)
CO2: 28 mmol/L (ref 22–32)
Calcium: 9 mg/dL (ref 8.9–10.3)
Chloride: 98 mmol/L (ref 98–111)
Creatinine, Ser: 0.72 mg/dL (ref 0.61–1.24)
GFR, Estimated: >60 mL/min (ref >60)
Glucose, Bld: 94 mg/dL (ref 70–99)
Potassium: 4.4 mmol/L (ref 3.5–5.1)
Sodium: 136 mmol/L (ref 135–145)
Total Bilirubin: 0.5 mg/dL (ref 0.0–1.2)
Total Protein: 7.4 g/dL (ref 6.5–8.1)

## 2024-07-28 LAB — PROTIME-INR
INR: 1.1 (ref 0.8–1.2)
Prothrombin Time: 14.4 s (ref 11.4–15.2)

## 2024-07-28 LAB — APTT: aPTT: 35 s (ref 24–36)

## 2024-07-28 NOTE — Pre-Procedure Instructions (Signed)
 Surgical Instructions     Your procedure is scheduled on Thursday, September 4th. Report to [facility] Main Entrance A at 5:30 A.M., then check in with the Admitting office. Any questions or running late day of surgery: call [phone]   Questions prior to your surgery date: call [phone], Monday-Friday, 8am-4pm. If you experience any cold or flu symptoms such as cough, fever, chills, shortness of breath, etc. between now and your scheduled surgery, please notify us  at the above number.            Remember:       Do not eat or drink after midnight the night before your surgery     Take these medicines the morning of surgery with A SIP OF WATER   carvedilol  (COREG )  cetirizine (ZYRTEC)  gabapentin  (NEURONTIN )  tamsulosin  (FLOMAX )      May take these medicines IF NEEDED: HYDROcodone -acetaminophen  (NORCO/VICODIN)  Naphazoline HCl (CLEAR EYES ) eye drops  traMADol (ULTRAM)      Per your physician's instruction's, HOLD your BRILINTA  and FISH OIL  for 5 days prior to surgery. Last dose on Friday, August 29th.   Per your physician's instruction's, STOP your sacubitril -valsartan  (ENTRESTO ) 3 days prior to surgery.  Last dose on Sunday, August 31st.    One week prior to surgery, STOP taking Aleve , Naproxen , Ibuprofen, Motrin, Advil, Goody's, BC's, all herbal medications, fish oil , and non-prescription vitamins.          Please follow your surgeon's instructions on when to stop Aspirin . If no instructions are given, please give them a call.             Do NOT Smoke (Tobacco/Vaping) for 24 hours prior to your procedure.   If you use a CPAP at night, you may bring your mask/headgear for your overnight stay.   You will be asked to remove any contacts, glasses, piercing's, hearing aid's, dentures/partials prior to surgery. Please bring cases for these items if needed.    Patients discharged the day of surgery will not be allowed to drive home, and someone needs to stay with them for  24 hours.   SURGICAL WAITING ROOM VISITATION Patients may have no more than 2 support people in the waiting area - these visitors may rotate.   Pre-op nurse will coordinate an appropriate time for 1 ADULT support person, who may not rotate, to accompany patient in pre-op.  Children under the age of 51 must have an adult with them who is not the patient and must remain in the main waiting area with an adult.   If the patient needs to stay at the hospital during part of their recovery, the visitor guidelines for inpatient rooms apply.   Please refer to the [facility] website for the visitor guidelines for any additional information.     If you received a COVID test during your pre-op visit  it is requested that you wear a mask when out in public, stay away from anyone that may not be feeling well and notify your surgeon if you develop symptoms. If you have been in contact with anyone that has tested positive in the last 10 days please notify you surgeon.         Pre-operative CHG Bathing Instructions    You can play a key role in reducing the risk of infection after surgery. Your skin needs to be as free of germs as possible. You can reduce the number of germs on your skin by washing with CHG (chlorhexidine  gluconate)  soap before surgery. CHG is an antiseptic soap that kills germs and continues to kill germs even after washing.    DO NOT use if you have an allergy to chlorhexidine /CHG or antibacterial soaps. If your skin becomes reddened or irritated, stop using the CHG and notify one of our RNs at [phone].               TAKE A SHOWER THE NIGHT BEFORE SURGERY AND THE DAY OF SURGERY     Please keep in mind the following:  DO NOT shave, including legs and underarms, 48 hours prior to surgery.   You may shave your face before/day of surgery.  Place clean sheets on your bed the night before surgery Use a clean washcloth (not used since being washed) for each shower. DO NOT sleep with  pet's night before surgery.   CHG Shower Instructions:  Wash your face and private area with normal soap. If you choose to wash your hair, wash first with your normal shampoo.  After you use shampoo/soap, rinse your hair and body thoroughly to remove shampoo/soap residue.  Turn the water  OFF and apply half the bottle of CHG soap to a CLEAN washcloth.  Apply CHG soap ONLY FROM YOUR NECK DOWN TO YOUR TOES (washing for 3-5 minutes)  DO NOT use CHG soap on face, private areas, open wounds, or sores.  Pay special attention to the area where your surgery is being performed.  If you are having back surgery, having someone wash your back for you may be helpful. Wait 2 minutes after CHG soap is applied, then you may rinse off the CHG soap.  Pat dry with a clean towel  Put on clean pajamas     Additional instructions for the day of surgery: DO NOT APPLY any lotions, deodorants, cologne, or perfumes.   Do not wear jewelry or makeup Do not wear nail polish, gel polish, artificial nails, or any other type of covering on natural nails (fingers and toes) Do not bring valuables to the hospital. [facility] is not responsible for valuables/personal belongings. Put on clean/comfortable clothes.  Please brush your teeth.  Ask your nurse before applying any prescription medications to the skin.

## 2024-07-28 NOTE — Progress Notes (Signed)
 PCP - Shona Rush, MD Cardiologist - Miriam Norris, NP  PPM/ICD - ICD,  Device Orders - received Rep Notified - yes  Chest x-ray - 07/28/2024 EKG - 04/07/2024 Stress Test - 08/30/2014 ECHO - 07/21/2024 Cardiac Cath - n/a  Sleep Study - denies CPAP - n/a  Fasting Blood Sugar - no DM Checks Blood Sugar _____ times a day  Last dose of GLP1 agonist-  n/a GLP1 instructions: n/a  Blood Thinner Instructions: hold Brilinta  for 5 days Aspirin  Instructions: follow your surgeon's instructions  ERAS Protcol - no, NPO PRE-SURGERY Ensure or G2- n/a  COVID TEST- n/a   Anesthesia review: yes, cardiac history, ICD  Patient denies shortness of breath, fever, cough and chest pain at PAT appointment   All instructions explained to the patient, with a verbal understanding of the material. Patient agrees to go over the instructions while at home for a better understanding. Patient also instructed to self quarantine after being tested for COVID-19. The opportunity to ask questions was provided.

## 2024-07-29 NOTE — Progress Notes (Signed)
 Anesthesia Chart Review:  Case: [id number] Date/Time: 07/30/24 0800   Procedures:      VIDEO BRONCHOSCOPY WITH ENDOBRONCHIAL NAVIGATION     BRONCHOSCOPY, WITH EBUS - ROBOTIC BRONCHOSCOPY WITH EBUS   Anesthesia type: General   Diagnosis: Right lower lobe lung mass [R91.8]   Pre-op diagnosis: RLL LUNG MASS   [facility]   Surgeons: Kerrin Elspeth BROCKS, MD       DISCUSSION: Patient is a 78 year old male scheduled for the above procedure.  He was found to have a 3.1 cm spiculated right lower lobe mass on lung cancer screening chest CT in May.  History includes smoking, COPD, CAD (NSTEMI, EF 20% 06/02/14, LHC not pursued due to AKI and mental status changes; NST 08/30/14: large inferior infarct, no ischemia, medical management; EF 50-55% 08/01/17), chronic systolic CHF, AICD (St. 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  S/p colonoscopy on 07/01/2024 by Shaaron Charleston, MD.2 sigmoid polyps resected. Sigmoid diverticulosis noted.   Patient last seen by cardiology on 06/25/2024 by Miriam Norris, NP and with EP Dr. Waddell on 04/07/2024. He was overall doing well from cardiac  standpoint. He was motivated to quit smoking following suspected lung cancer diagnosis. No anginal symptoms. Euvolemic on exam with normal ICD function at last check. Medical therapy continued. Carotid US  ordered for right carotid bruit and showed 1-39% BICA stenosis 07/21/2024. TTE updated to evaluate aortic dilatation and on 07/21/2024 showed LVEF 60-65%, no RWMA, grade 1 DD, normal RV systolic function, normal PASP, mild MR, visualized aorta was not noted to be dilated. Per 07/09/2024 note by Dr. Kerrin, Discussed with Dr. Alvan, he does not feel that any additional cardiac workup would be necessary prior to surgery.  EP ICD preoperative recommendations: Device Type:  Biochemist, clinical and Phone #:  St. Jude/Abbott: [phone] Pacemaker Dependent?:  No. Date of Last Device Check:  05/12/24           Normal Device Function?:  Yes.     Electrophysiologist's Recommendations:  Have magnet available. Provide continuous ECG monitoring when magnet is used or reprogramming is to be performed.  Procedure will likely interfere with device function.  Device should be programmed:  Asynchronous pacing during procedure and returned to normal programming after procedure   Reported instructions to hold Brilinta  for 5 days for procedure.  Anesthesia team to evaluate on the day of surgery.   VS: BP (!) 157/67   Pulse 65   Temp 36.6 C   Resp 18   Ht 5' 3 (1.6 m)   Wt 64.4 kg   SpO2 99%   BMI 25.15 kg/m    PROVIDERS: Shona Norleen PEDLAR, MD is PCP - Alvan Carrier, MD is primary cardiologist - Waddell Lusher, MD is EP cardiologist - Serene, V. Malvina, MD is vascular surgeon - McKenzie,  Belvie, MD is urologist. Evaluated on 07/20/2024 for phimosis. Clotrimazole  x 4 week planned and if fails to improve balanitis will plan circumcision.    LABS: Labs reviewed: Acceptable for surgery. (all labs ordered are listed, but only abnormal results are displayed)  Labs Reviewed  SURGICAL PCR  SCREEN  COMPREHENSIVE METABOLIC PANEL WITH GFR  CBC  PROTIME-INR  APTT   PFTs 07/08/2024: FVC 3.57 (117%), post 3.61 (118%). FEV1 2.34 (109%), post 2.34 (109%). DLCO unc 8.94 (45%).   IMAGES: NM PET Super D CT Chest 07/02/2024: IMPRESSION: 1. Imaging for bronchoscopy planning and guidance. 2. The dominant spiculated right lower lobe mass is hypermetabolic on PET-CT today and consistent with bronchogenic carcinoma. 3. Multiple additional smaller bilateral pulmonary nodules are unchanged from the recent chest CT and not hypermetabolic on PET-CT. Recommend attention on follow-up. 4. No evidence of thoracic adenopathy or pleural effusion. 5. Aortic Atherosclerosis (ICD10-I70.0) and Emphysema (ICD10-J43.9).   EKG: 04/07/2024: AV dual-paced rhythm When compared with ECG of 11-Nov-2023 09:55, No significant change was found Confirmed by Waddell Lusher [phone]) on 04/07/2024 9:55:28 AM   CV: Echo 07/21/2024: IMPRESSIONS   1. Left ventricular ejection fraction, by estimation, is 60 to 65%. Left  ventricular ejection fraction by 3D volume is 54 %. The left ventricle has  normal function. The left ventricle has no regional wall motion  abnormalities. Left ventricular diastolic   parameters are consistent with Grade I diastolic dysfunction (impaired  relaxation). The average left ventricular global longitudinal strain is  -18.8 %. The global longitudinal strain is normal.   2. Right ventricular systolic function is normal. The right ventricular  size is normal. There is normal pulmonary artery systolic pressure.   3. The mitral valve is normal in structure. Mild mitral valve  regurgitation. No evidence of mitral stenosis.   4. The aortic valve was not well visualized. Aortic valve regurgitation  is not visualized. No aortic stenosis is present.   5. The inferior vena cava is normal in size with greater than 50%  respiratory variability, suggesting right atrial pressure of 3 mmHg.    Comparison(s): A prior study was performed on 04/01/2023. EF 55-60%. Trivial  MR.  (Comparison: EF 50-55% 08/01/17; EF 35% 07/14/15, LVEF of 20% in setting of NSTEMI 05/2014)     US  Carotid 07/21/2024: Summary:  - Right Carotid: Velocities in the right ICA are consistent with a 1-39% stenosis. Non-hemodynamically significant plaque <50% noted in the CCA. The ECA appears <50% stenosed.  - Left Carotid: Velocities in the left ICA are consistent with a 1-39% stenosis. Non-hemodynamically significant plaque <50% noted in the CCA. The ECA appears <50% stenosed.  - Vertebrals:  Right vertebral artery demonstrates antegrade flow. Left vertebral artery demonstrates no discernable flow.  - Subclavians: Normal flow hemodynamics were seen in bilateral subclavian arteries.    EP Procedure 11/11/2023: Conclusion: successful ICD gen change out with the finding of an occluded innominate and subclavian vein preventing insertion of a new ICD in a patient with chronic systolic heart failure s/p BIV ICD who had reached ERI with noise on the ICD lead.    Nuclear stress test 08/30/14 (according to 11/02/14 note by Dr. Charls) demonstrated a large area of inferior myocardial scar with no evidence of ischemia, and inferior akinesis.  Past Medical History:  Diagnosis Date   AICD (automatic cardioverter/defibrillator) present 08/19/2015   St Jude BiV ICD for primary prevention by Dr. Waddell   Alcohol abuse    6 beers per day; hospital admission in  2009 for withdrawal symptoms   Anxiety and depression    denies    Cerebrovascular disease 2009   TIA; 2009- right ICA stent; re-intervention for restenosis complicated by [facility] w/o sx   CHF (congestive heart failure) (HCC) 06/02/2014   Chronic obstructive pulmonary disease (HCC)    Degenerative joint disease    Total shoulder arthroplasty-right   Hyperlipidemia    Hypertension    LBBB (left bundle branch block)    Normal echo-2011; stress nuclear in  09/2010--septal hypoperfusion representing nontransmural infarction or the effect of left bundle branch block, no ischemia   Lung cancer (HCC)    Myocardial infarction (HCC) 06/02/2014   Massive Heart Attack   Peripheral vascular disease (HCC)    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   Thrombocytopenia (HCC)    Tobacco abuse    -100 pack years; 1.5 packs per day   Traumatic seroma of thigh (HCC)    left   Tubular adenoma of colon     Past Surgical History:  Procedure Laterality Date   ABDOMINAL AORTAGRAM [name] 05/04/2014   Procedure: ABDOMINAL EZELLA;  Surgeon: Gaile LELON New, MD;  Location: [facility] CATH LAB;  Service: Cardiovascular;  Laterality: [name];   ABDOMINAL AORTOGRAM [name] 06/25/2017   Procedure: Abdominal Aortogram;  Surgeon: New Gaile LELON, MD;  [facility];  Service: Cardiovascular;  Laterality: [name];   ABDOMINAL AORTOGRAM W/LOWER EXTREMITY [name] 04/22/2018   Procedure: ABDOMINAL AORTOGRAM W/LOWER EXTREMITY;  Surgeon: New Gaile LELON, MD;  [facility];  Service: Cardiovascular;  Laterality: [name];   ABDOMINAL AORTOGRAM W/LOWER EXTREMITY [name] 03/08/2020   Procedure: ABDOMINAL AORTOGRAM W/LOWER EXTREMITY;  Surgeon: New Gaile LELON, MD;  [facility];  Service: Cardiovascular;  Laterality: [name];   ABDOMINAL AORTOGRAM W/LOWER EXTREMITY Bilateral 08/23/2020   Procedure: ABDOMINAL AORTOGRAM W/LOWER EXTREMITY;  Surgeon: New Gaile LELON, MD;  [facility];  Service: Cardiovascular;  Laterality: Bilateral;   ABDOMINAL AORTOGRAM W/LOWER EXTREMITY [name] 10/31/2021   Procedure: ABDOMINAL AORTOGRAM W/LOWER EXTREMITY;  Surgeon: New Gaile LELON, MD;  [facility];  Service: Cardiovascular;  Laterality: [name];   ABDOMINAL AORTOGRAM W/LOWER EXTREMITY [name] 12/19/2021   Procedure: ABDOMINAL AORTOGRAM W/LOWER EXTREMITY;  Surgeon: New Gaile LELON, MD;  [facility];  Service: Cardiovascular;  Laterality:  [name];   ABDOMINAL AORTOGRAM W/LOWER EXTREMITY Bilateral 02/19/2023   Procedure: ABDOMINAL AORTOGRAM W/LOWER EXTREMITY;  Surgeon: New Gaile LELON, MD;  [facility];  Service: Cardiovascular;  Laterality: Bilateral;   AGILE CAPSULE [name] 03/18/2014   Procedure: AGILE CAPSULE;  Surgeon: Margo LITTIE Haddock, MD;  [facility];  Service: Endoscopy;  Laterality: [name];  7:30   APPLICATION OF WOUND VAC Left 09/03/2017   Procedure: APPLICATION OF WOUND VAC;  Surgeon: Laurence Redell LITTIE, MD;  Location: [facility] OR;  Service: Vascular;  Laterality: Left;   APPLICATION OF WOUND VAC Right 04/07/2020   Procedure: PLACEMENT OF ANTIBIOTIC BEADS AND APPLICATION OF WOUND VAC;  Surgeon: New Gaile LELON, MD;  [facility];  Service: Vascular;  Laterality: Right;   BACK SURGERY     BACTERIAL OVERGROWTH TEST [name] 05/24/2015   Procedure: BACTERIAL OVERGROWTH TEST;  Surgeon: Margo LITTIE Haddock, MD;  [facility];  Service: Endoscopy;  Laterality: [name];  0700   BI-VENTRICULAR IMPLANTABLE CARDIOVERTER DEFIBRILLATOR  (CRT-D)  08/19/2015   BIV ICD GENERATOR CHANGEOUT [name] 11/11/2023   Procedure: BIV ICD GENERATOR CHANGEOUT;  Surgeon: Waddell Danelle LELON,  MD;  [facility];  Service: Cardiovascular;  Laterality: [name];   CARPAL TUNNEL RELEASE Left 02/02/2016   Procedure: LEFT CARPAL TUNNEL RELEASE;  Surgeon: Arley Curia, MD;  Location: [facility];  Service: Orthopedics;  Laterality: Left;  ANESTHESIA: IV REGIONAL UPPER ARM   CATARACT EXTRACTION W/PHACO Right 03/08/2015   Procedure: CATARACT EXTRACTION PHACO AND INTRAOCULAR LENS PLACEMENT (IOC);  Surgeon: Oneil Platts, MD;  Location: [facility];  Service: Ophthalmology;  Laterality: Right;  CDE:9.46   CATARACT EXTRACTION W/PHACO Left 03/22/2015   Procedure: CATARACT EXTRACTION PHACO AND INTRAOCULAR LENS PLACEMENT (IOC);  Surgeon: Oneil Platts, MD;  Location: [facility];  Service: Ophthalmology;  Laterality: Left;  CDE:5.80   COLONOSCOPY  08/22/09    Fields-(Tubular Adenoma)3-mm transverse polyp/4-mm polyp otherwise noraml/small internal hemorrhoids   COLONOSCOPY [name] 04/14/2014   DOQ:dfjoo internal hemorrhids/normal mocsa in the terminal iluem/left colonis redundant   COLONOSCOPY [name] 07/01/2024   Procedure: COLONOSCOPY;  Surgeon: Shaaron Lamar HERO, MD;  [facility];  Service: Endoscopy;  Laterality: [name];  900am, asa 3   COLONOSCOPY W/ POLYPECTOMY  2011   ENDARTERECTOMY FEMORAL Left 08/16/2017   Procedure: ENDARTERECTOMY LEFT PROFUNDA FEMORAL;  Surgeon: Serene Gaile ORN, MD;  Location: [facility] OR;  Service: Vascular;  Laterality: Left;   ENDARTERECTOMY FEMORAL Right 03/31/2020   Procedure: RIGHT FEMORAL ENDARTERECTOMY;  Surgeon: Serene Gaile ORN, MD;  [facility];  Service: Vascular;  Laterality: Right;   EP IMPLANTABLE DEVICE [name] 08/19/2015   Procedure: BiV ICD Insertion CRT-D;  Surgeon: Danelle ORN Birmingham, MD;  Location: [facility] INVASIVE CV LAB;  Service: Cardiovascular;  Laterality: [name];   ESOPHAGOGASTRODUODENOSCOPY [name] 03/05/2014   SLF: 1. Stricture at the gastroesophagael junction 2. Small hiatal hernia 3. Moderate non-erosive gastritis and duodentitis. 4. No surce for Melena identified.    FEMORAL-POPLITEAL BYPASS GRAFT Left 08/16/2017   Procedure: LEFT  FEMORAL-POPLITEAL ARTERY  BYPASS GRAFT;  Surgeon: Serene Gaile ORN, MD;  [facility];  Service: Vascular;  Laterality: Left;   GIVENS CAPSULE STUDY [name] 03/30/2014   Procedure: GIVENS CAPSULE STUDY;  Surgeon: Margo LITTIE Haddock, MD;  [facility];  Service: Endoscopy;  Laterality: [name];  7:30   GROIN DEBRIDEMENT Right 04/07/2020   Procedure: INCISION AND DRAINAGE OF RIGHT GROIN;  Surgeon: Serene Gaile ORN, MD;  [facility];  Service: Vascular;  Laterality: Right;   I & D EXTREMITY Left 09/03/2017   Procedure: IRRIGATION AND DEBRIDEMENT EXTREMITY LEFT THIGH SEROMA;  Surgeon: Laurence Redell LITTIE, MD;  [facility];  Service: Vascular;  Laterality: Left;   INSERT / REPLACE / REMOVE PACEMAKER      INSERTION OF ILIAC STENT  03/31/2020   Procedure: INSERTION OF RIGHT ILIAC ARTERY STENT AND RIGHT SUPERFICIAL FEMORAL ARTERY STENT;  Surgeon: Serene Gaile ORN, MD;  [facility];  Service: Vascular;;   IR ANGIO INTRA EXTRACRAN SEL COM CAROTID INNOMINATE BILAT MOD SED  07/16/2017   IR ANGIO INTRA EXTRACRAN SEL COM CAROTID INNOMINATE BILAT MOD SED  06/12/2019   IR ANGIO INTRA EXTRACRAN SEL COM CAROTID INNOMINATE BILAT MOD SED  01/17/2021   IR ANGIO VERTEBRAL SEL SUBCLAVIAN INNOMINATE BILAT MOD SED  07/16/2017   IR ANGIO VERTEBRAL SEL SUBCLAVIAN INNOMINATE BILAT MOD SED  06/12/2019   IR ANGIO VERTEBRAL SEL SUBCLAVIAN INNOMINATE BILAT MOD SED  01/17/2021   IR RADIOLOGIST EVAL & MGMT  04/01/2017   IR RADIOLOGIST EVAL & MGMT  08/02/2017   IR TRANSCATH EXCRAN VERT OR CAR A STENT  07/22/2019   IR US  GUIDE VASC ACCESS RIGHT  06/12/2019   IR US  GUIDE VASC ACCESS RIGHT  01/17/2021   JOINT REPLACEMENT Right    Total Shoulder Replacement    LOWER EXTREMITY ANGIOGRAPHY Bilateral 06/25/2017   Procedure: Lower Extremity Angiography;  Surgeon: Serene Gaile ORN, MD;  [facility];  Service: Cardiovascular;  Laterality: Bilateral;   LUMBAR FUSION  2010   PATCH ANGIOPLASTY Right 03/31/2020   Procedure: PATCH ANGIOPLASTY USING XENOSURE BIOLOGIC PATCH 1cm x 14cm;  Surgeon: Serene Gaile ORN, MD;  Location: [facility] OR;  Service: Vascular;  Laterality: Right;   PERIPHERAL VASCULAR ATHERECTOMY Right 04/22/2018   Procedure: PERIPHERAL VASCULAR ATHERECTOMY;  Surgeon: Serene Gaile ORN, MD;  [facility];  Service: Cardiovascular;  Laterality: Right;  right superficial femoral   PERIPHERAL VASCULAR BALLOON ANGIOPLASTY Right 04/22/2018   Procedure: PERIPHERAL VASCULAR BALLOON ANGIOPLASTY;  Surgeon: Serene Gaile ORN, MD;  [facility];  Service: Cardiovascular;  Laterality: Right;  external iliac   PERIPHERAL VASCULAR BALLOON ANGIOPLASTY Left 03/08/2020   Procedure: PERIPHERAL VASCULAR BALLOON  ANGIOPLASTY;  Surgeon: Serene Gaile ORN, MD;  [facility];  Service: Cardiovascular;  Laterality: Left;  Common Femoral   PERIPHERAL VASCULAR BALLOON ANGIOPLASTY Left 08/23/2020   Procedure: PERIPHERAL VASCULAR BALLOON ANGIOPLASTY;  Surgeon: Serene Gaile ORN, MD;  [facility];  Service: Cardiovascular;  Laterality: Left;  common femoral   PERIPHERAL VASCULAR BALLOON ANGIOPLASTY Left 10/31/2021   Procedure: PERIPHERAL VASCULAR BALLOON ANGIOPLASTY;  Surgeon: Serene Gaile ORN, MD;  [facility];  Service: Cardiovascular;  Laterality: Left;  external iliac   PERIPHERAL VASCULAR INTERVENTION Right 03/08/2020   Procedure: PERIPHERAL VASCULAR INTERVENTION;  Surgeon: Serene Gaile ORN, MD;  [facility];  Service: Cardiovascular;  Laterality: Right;  SFA   PERIPHERAL VASCULAR INTERVENTION Right 08/23/2020   Procedure: PERIPHERAL VASCULAR INTERVENTION;  Surgeon: Serene Gaile ORN, MD;  [facility];  Service: Cardiovascular;  Laterality: Right;  external iliac   PERIPHERAL VASCULAR INTERVENTION Right 10/31/2021   Procedure: PERIPHERAL VASCULAR INTERVENTION;  Surgeon: Serene Gaile ORN, MD;  [facility];  Service: Cardiovascular;  Laterality: Right;  superficial femoral artery   PERIPHERAL VASCULAR INTERVENTION Left 12/19/2021   Procedure: PERIPHERAL VASCULAR INTERVENTION;  Surgeon: Serene Gaile ORN, MD;  [facility];  Service: Cardiovascular;  Laterality: Left;  stent lt ext iliac / pta common femoral   PERIPHERAL VASCULAR INTERVENTION  02/19/2023   Procedure: PERIPHERAL VASCULAR INTERVENTION;  Surgeon: Serene Gaile ORN, MD;  [facility];  Service: Cardiovascular;;   RADIOLOGY WITH ANESTHESIA [name] 07/01/2019   Procedure: STENTING;  Surgeon: Dolphus Carrion, MD;  [facility];  Service: Radiology;  Laterality: [name];   RADIOLOGY WITH ANESTHESIA [name] 07/22/2019   Procedure: STENTING;   Surgeon: Dolphus Carrion, MD;  [facility];  Service: Radiology;  Laterality: [name];   TOTAL SHOULDER ARTHROPLASTY Right 2011   Dr. Dozier COWBOY NERVE TRANSPOSITION Left 02/02/2016   Procedure: LEFT IN-SITU DECOMPRESSION ULNAR NERVE ;  Surgeon: Arley Curia, MD;  Location: [facility];  Service: Orthopedics;  Laterality: Left;   ULNAR TUNNEL RELEASE Left 02/02/2016   Procedure: LEFT CUBITAL TUNNEL RELEASE;  Surgeon: Arley Curia, MD;  Location: [facility];  Service: Orthopedics;  Laterality: Left;   VASECTOMY  1971   VEIN HARVEST Left 08/16/2017   Procedure: USING NON REVERSE LEFT  GREATER SAPHENOUS VEIN HARVEST;  Surgeon: Serene Gaile ORN, MD;  [facility];  Service: Vascular;  Laterality: Left;    MEDICATIONS:  aspirin  EC 81 MG tablet   atorvastatin  (LIPITOR ) 20 MG tablet   BRILINTA  90 MG TABS tablet   carvedilol  (COREG ) 6.25 MG tablet   cetirizine (ZYRTEC) 10 MG tablet   clotrimazole -betamethasone  (LOTRISONE ) cream   Cobalamin Combinations (NEURIVA PLUS) CAPS   dextromethorphan -guaiFENesin  (MUCINEX  DM) 30-600 MG 12hr tablet   gabapentin  (NEURONTIN ) 300 MG capsule   HYDROcodone -acetaminophen  (NORCO) 10-325 MG tablet   Magnesium  250 MG TABS   Multiple Vitamins-Minerals (CENTRUM SILVER PO)   Naphazoline HCl (CLEAR EYES OP)   sacubitril -valsartan  (ENTRESTO ) 97-103 MG   Salicylic Acid (GOLD BOND PSORIASIS RELIEF) 3 % CREA   silodosin (RAPAFLO) 8 MG CAPS capsule   SUPER B COMPLEX/C PO   tamsulosin  (FLOMAX ) 0.4 MG CAPS capsule   traMADol (ULTRAM) 50 MG tablet   HYDROcodone -acetaminophen  (NORCO/VICODIN) 5-325 MG tablet   Omega-3 Fatty Acids (FISH OIL ) 1000 MG CAPS   No current facility-administered medications for this encounter.    Isaiah Ruder, PA-C Surgical Short Stay/Anesthesiology [facility] Phone [phone] [facility] Phone [phone] 07/29/2024 12:39 PM

## 2024-07-29 NOTE — Anesthesia Preprocedure Evaluation (Addendum)
 Anesthesia Evaluation  Patient identified by MRN, date of birth, ID band Patient awake    Reviewed: Allergy & Precautions, H&P , NPO status , Patient's Chart, lab work & pertinent test results  Airway Mallampati: II  TM Distance: >3 FB Neck ROM: Full    Dental no notable dental hx. (+) Edentulous Upper, Edentulous Lower, Dental Advisory Given   Pulmonary COPD, Current Smoker and Patient abstained from smoking.   Pulmonary exam normal breath sounds clear to auscultation       Cardiovascular Exercise Tolerance: Good hypertension, Pt. on medications and Pt. on home beta blockers + CAD, + Past MI, + Peripheral Vascular Disease and +CHF  + dysrhythmias + pacemaker + Cardiac Defibrillator  Rhythm:Regular Rate:Normal     Neuro/Psych   Anxiety Depression    negative neurological ROS     GI/Hepatic negative GI ROS, Neg liver ROS,,,  Endo/Other  negative endocrine ROS    Renal/GU negative Renal ROS  negative genitourinary   Musculoskeletal  (+) Arthritis , Osteoarthritis,    Abdominal   Peds  Hematology  (+) Blood dyscrasia, anemia   Anesthesia Other Findings   Reproductive/Obstetrics negative OB ROS                              Anesthesia Physical Anesthesia Plan  ASA: 3  Anesthesia Plan: General   Post-op Pain Management: Tylenol  PO (pre-op)*   Induction: Intravenous  PONV Risk Score and Plan: 2 and Ondansetron , Propofol  infusion, TIVA and Dexamethasone   Airway Management Planned: Oral ETT  Additional Equipment:   Intra-op Plan:   Post-operative Plan: Extubation in OR  Informed Consent: I have reviewed the patients History and Physical, chart, labs and discussed the procedure including the risks, benefits and alternatives for the proposed anesthesia with the patient or authorized representative who has indicated his/her understanding and acceptance.     Dental advisory  given  Plan Discussed with: CRNA  Anesthesia Plan Comments: (PAT note written 07/29/2024 by Isaiah Ruder, PA-C.    EP ICD preoperative recommendations: Device Type:  Defibrillator Manufacturer and Phone #:  St. Jude/Abbott: [phone] Pacemaker Dependent?:  No. Date of Last Device Check:  05/12/24           Normal Device Function?:  Yes.     Electrophysiologist's Recommendations:   Have magnet available.  Provide continuous ECG monitoring when magnet is used or reprogramming is to be performed.   Procedure will likely interfere with device function.  Device should be programmed:  Asynchronous pacing during procedure and returned to normal programming after procedure  )         Anesthesia Quick Evaluation

## 2024-07-30 ENCOUNTER — Ambulatory Visit (HOSPITAL_COMMUNITY)

## 2024-07-30 ENCOUNTER — Ambulatory Visit (HOSPITAL_COMMUNITY): Payer: Self-pay | Admitting: Vascular Surgery

## 2024-07-30 ENCOUNTER — Other Ambulatory Visit: Payer: Self-pay

## 2024-07-30 ENCOUNTER — Encounter (HOSPITAL_COMMUNITY)
Admission: RE | Disposition: A | Discharge status: Home / Self Care | Payer: Self-pay | Source: Home / Self Care | Attending: Thoracic Surgery (Cardiothoracic Vascular Surgery)

## 2024-07-30 ENCOUNTER — Encounter (HOSPITAL_COMMUNITY): Payer: Self-pay | Admitting: Thoracic Surgery (Cardiothoracic Vascular Surgery)

## 2024-07-30 ENCOUNTER — Ambulatory Visit (HOSPITAL_COMMUNITY)
Admission: RE | Admit: 2024-07-30 | Discharge: 2024-07-30 | Disposition: A | Discharge status: Home / Self Care | Attending: Thoracic Surgery (Cardiothoracic Vascular Surgery) | Admitting: Thoracic Surgery (Cardiothoracic Vascular Surgery)

## 2024-07-30 DIAGNOSIS — I447 Left bundle-branch block, unspecified: Secondary | ICD-10-CM | POA: Diagnosis not present

## 2024-07-30 DIAGNOSIS — Z95810 Presence of automatic (implantable) cardiac defibrillator: Secondary | ICD-10-CM | POA: Diagnosis not present

## 2024-07-30 DIAGNOSIS — I252 Old myocardial infarction: Secondary | ICD-10-CM | POA: Insufficient documentation

## 2024-07-30 DIAGNOSIS — K7460 Unspecified cirrhosis of liver: Secondary | ICD-10-CM | POA: Insufficient documentation

## 2024-07-30 DIAGNOSIS — E785 Hyperlipidemia, unspecified: Secondary | ICD-10-CM | POA: Insufficient documentation

## 2024-07-30 DIAGNOSIS — Z96611 Presence of right artificial shoulder joint: Secondary | ICD-10-CM | POA: Diagnosis not present

## 2024-07-30 DIAGNOSIS — I2510 Atherosclerotic heart disease of native coronary artery without angina pectoris: Secondary | ICD-10-CM

## 2024-07-30 DIAGNOSIS — C3431 Malignant neoplasm of lower lobe, right bronchus or lung: Secondary | ICD-10-CM | POA: Diagnosis not present

## 2024-07-30 DIAGNOSIS — R918 Other nonspecific abnormal finding of lung field: Secondary | ICD-10-CM | POA: Diagnosis not present

## 2024-07-30 DIAGNOSIS — I509 Heart failure, unspecified: Secondary | ICD-10-CM | POA: Diagnosis not present

## 2024-07-30 DIAGNOSIS — J439 Emphysema, unspecified: Secondary | ICD-10-CM | POA: Insufficient documentation

## 2024-07-30 DIAGNOSIS — J449 Chronic obstructive pulmonary disease, unspecified: Secondary | ICD-10-CM

## 2024-07-30 DIAGNOSIS — Z48813 Encounter for surgical aftercare following surgery on the respiratory system: Secondary | ICD-10-CM | POA: Diagnosis not present

## 2024-07-30 DIAGNOSIS — I110 Hypertensive heart disease with heart failure: Secondary | ICD-10-CM | POA: Diagnosis not present

## 2024-07-30 DIAGNOSIS — I739 Peripheral vascular disease, unspecified: Secondary | ICD-10-CM | POA: Insufficient documentation

## 2024-07-30 DIAGNOSIS — F17210 Nicotine dependence, cigarettes, uncomplicated: Secondary | ICD-10-CM | POA: Diagnosis not present

## 2024-07-30 DIAGNOSIS — Z471 Aftercare following joint replacement surgery: Secondary | ICD-10-CM | POA: Diagnosis not present

## 2024-07-30 HISTORY — PX: VIDEO BRONCHOSCOPY WITH ENDOBRONCHIAL NAVIGATION: SHX6175

## 2024-07-30 HISTORY — PX: BRONCHIAL NEEDLE ASPIRATION BIOPSY: SHX5106

## 2024-07-30 HISTORY — PX: VIDEO BRONCHOSCOPY WITH ENDOBRONCHIAL ULTRASOUND: SHX6177

## 2024-07-30 HISTORY — PX: BRONCHIAL BIOPSY: SHX5109

## 2024-07-30 SURGERY — VIDEO BRONCHOSCOPY WITH ENDOBRONCHIAL NAVIGATION
Anesthesia: General

## 2024-07-30 MED ORDER — FENTANYL CITRATE (PF) 250 MCG/5ML IJ SOLN
INTRAMUSCULAR | Status: DC | PRN
  Administered 2024-07-30 (×2): 25 ug via INTRAVENOUS
  Administered 2024-07-30: 50 ug via INTRAVENOUS

## 2024-07-30 MED ORDER — ONDANSETRON HCL 4 MG/2ML IJ SOLN
INTRAMUSCULAR | Status: DC | PRN
  Administered 2024-07-30: 4 mg via INTRAVENOUS

## 2024-07-30 MED ORDER — ORAL CARE MOUTH RINSE: 15.0000 mL | Freq: Once | OROMUCOSAL | Status: AC

## 2024-07-30 MED ORDER — ACETAMINOPHEN 500 MG PO TABS: 1000.0000 mg | ORAL_TABLET | Freq: Once | ORAL | Status: DC

## 2024-07-30 MED ORDER — LIDOCAINE 2% (20 MG/ML) 5 ML SYRINGE
INTRAMUSCULAR | Status: DC | PRN
  Administered 2024-07-30: 60 mg via INTRAVENOUS

## 2024-07-30 MED ORDER — FENTANYL CITRATE (PF) 100 MCG/2ML IJ SOLN: 25.0000 ug | INTRAMUSCULAR | Status: DC | PRN

## 2024-07-30 MED ORDER — PROPOFOL 10 MG/ML IV BOLUS
INTRAVENOUS | Status: DC | PRN
  Administered 2024-07-30: 110 mg via INTRAVENOUS
  Administered 2024-07-30: 125 ug/kg/min via INTRAVENOUS

## 2024-07-30 MED ORDER — ROCURONIUM BROMIDE 10 MG/ML (PF) SYRINGE
PREFILLED_SYRINGE | INTRAVENOUS | Status: DC | PRN
  Administered 2024-07-30: 20 mg via INTRAVENOUS
  Administered 2024-07-30: 30 mg via INTRAVENOUS
  Administered 2024-07-30: 10 mg via INTRAVENOUS

## 2024-07-30 MED ORDER — CHLORHEXIDINE GLUCONATE 0.12 % MT SOLN
15.0000 mL | Freq: Once | OROMUCOSAL | Status: AC
  Administered 2024-07-30: 15 mL via OROMUCOSAL
  Filled 2024-07-30: qty 15

## 2024-07-30 MED ORDER — SUGAMMADEX SODIUM 200 MG/2ML IV SOLN
INTRAVENOUS | Status: DC | PRN
  Administered 2024-07-30: 150 mg via INTRAVENOUS

## 2024-07-30 MED ORDER — DEXAMETHASONE SODIUM PHOSPHATE 10 MG/ML IJ SOLN
INTRAMUSCULAR | Status: DC | PRN
  Administered 2024-07-30: 10 mg via INTRAVENOUS

## 2024-07-30 MED ORDER — SUCCINYLCHOLINE CHLORIDE 200 MG/10ML IV SOSY
PREFILLED_SYRINGE | INTRAVENOUS | Status: DC | PRN
  Administered 2024-07-30: 100 mg via INTRAVENOUS

## 2024-07-30 MED ORDER — LACTATED RINGERS IV SOLN: INTRAVENOUS | Status: DC

## 2024-07-30 MED ORDER — FENTANYL CITRATE (PF) 100 MCG/2ML IJ SOLN
INTRAMUSCULAR | Status: AC
  Filled 2024-07-30: qty 2

## 2024-07-30 NOTE — Anesthesia Procedure Notes (Signed)
 Procedure Name: Intubation Date/Time: 07/30/2024 8:03 AM  Performed by: Julien Manus, CRNAPre-anesthesia Checklist: Patient identified, Emergency Drugs available, Suction available and Patient being monitored Patient Re-evaluated:Patient Re-evaluated prior to induction Oxygen Delivery Method: Circle System Utilized Preoxygenation: Pre-oxygenation with 100% oxygen Induction Type: IV induction Ventilation: Mask ventilation without difficulty Grade View: Grade I Tube type: Oral Tube size: 8.5 mm Number of attempts: 1 Airway Equipment and Method: Stylet and Oral airway Placement Confirmation: ETT inserted through vocal cords under direct vision, positive ETCO2 and breath sounds checked- equal and bilateral Secured at: 22 cm Tube secured with: Tape Dental Injury: Teeth and Oropharynx as per pre-operative assessment

## 2024-07-30 NOTE — Discharge Instructions (Addendum)
 Do not drive or engage in heavy physical activity for 24 hours.  You may resume normal activities tomorrow  You may cough up small amounts of blood over the next few days.    You may resume Brilinta  on Saturday 9/6  You may use the pain medication you already have prescribed for discomfort.  You may use an over the counter cough medication or throat lozenge of your choice if needed.  Call [phone] if you develop chest pain, shortness of breath, fever > 101 F or cough up more than a tablespoon of blood.  My office will arrange a follow up appointment for next week to discuss the results

## 2024-07-30 NOTE — Anesthesia Postprocedure Evaluation (Signed)
 Anesthesia Post Note  Patient: DEVERICK PRUSS  Procedure(s) Performed: VIDEO BRONCHOSCOPY WITH ENDOBRONCHIAL NAVIGATION BRONCHOSCOPY, WITH EBUS BRONCHOSCOPY, WITH NEEDLE ASPIRATION BIOPSY BRONCHOSCOPY, WITH BIOPSY     Patient location during evaluation: PACU Anesthesia Type: General Level of consciousness: awake and alert Pain management: pain level controlled Vital Signs Assessment: post-procedure vital signs reviewed and stable Respiratory status: spontaneous breathing, nonlabored ventilation and respiratory function stable Cardiovascular status: blood pressure returned to baseline and stable Postop Assessment: no apparent nausea or vomiting Anesthetic complications: no   No notable events documented.  Last Vitals:  Vitals:   07/30/24 0945 07/30/24 1000  BP: 136/83 (!) 149/52  Pulse: 67 67  Resp: 14 15  Temp:  36.8 C  SpO2: 94% 95%    Last Pain:  Vitals:   07/30/24 1000  TempSrc:   PainSc: 0-No pain                 Johndavid Geralds,W. EDMOND

## 2024-07-30 NOTE — Op Note (Signed)
 Video Bronchoscopy with Robotic Assisted Bronchoscopic Navigation   Date of Operation: 07/30/2024   Pre-op Diagnosis: Right lower lobe mass  Post-op Diagnosis: Non-small cell carcinoma right lower lobe  Surgeon: Dr. Kerrin  Assistants: None  Anesthesia: General endotracheal anesthesia  Operation: Flexible video fiberoptic bronchoscopy with robotic assistance and biopsies.  Estimated Blood Loss: Minimal  Complications: None  Indications and History: Roy [name] is a 78 year old man with a history of tobacco abuse, emphysema, MI, left bundle branch block, congestive heart failure, ICD, ethanol abuse and withdrawal, cirrhosis, PAD, hypertension, hyperlipidemia, and thrombocytopenia. He was found to have a 3.1 cm spiculated right lower lobe mass on a low-dose CT for lung cancer screening in May.   Recommendation made to achieve a tissue diagnosis via robotic assisted navigational bronchoscopy and endobronchial ultrasound to complete clinical staging.  The risks, benefits, complications, treatment options and expected outcomes were discussed with the patient.  The possibilities of pneumothorax, pneumonia, reaction to medication, pulmonary aspiration, perforation of a viscus, bleeding, failure to diagnose a condition and creating a complication requiring transfusion or operation were discussed with the patient who freely signed the consent.    Description of Procedure: The patient was seen in the Preoperative Area, was examined and was deemed appropriate to proceed.  The patient was taken to [facility] Endoscopy room 3, identified as Quintin LITTIE Clause and the procedure verified as Flexible Video Fiberoptic Bronchoscopy.  A Time Out was held and the above information confirmed.   Prior to the procedure a high-resolution CT scan of the chest was performed. Utilizing ION software program a virtual tracheobronchial tree was generated to allow the creation of distinct navigation pathways to the  patient's parenchymal abnormalities. After being taken to the operating room general anesthesia was initiated and the patient  was orally intubated. The video fiberoptic bronchoscope was introduced via the endotracheal tube and a general inspection was performed which showed normal right and left lung anatomy. Aspiration of the bilateral mainstems was completed to remove any remaining secretions. Robotic catheter inserted into patient's endotracheal tube.   Target #1 right lower lobe: The distinct navigation pathways prepared prior to this procedure were then utilized to navigate to patient's lesion identified on CT scan. The robotic catheter was secured into place and the vision probe was withdrawn.  Lesion location was approximated using fluoroscopy.  Local registration and targeting was performed using [employer] Cios mobile C-arm three-dimensional imaging. Under fluoroscopic guidance transbronchial needle biopsies and transbronchial forceps biopsies were performed to be sent for cytology and pathology.  Position was confirmed using Cios mobile C-arm.  The robot was undocked from the endotracheal tube.  Endobronchial ultrasound  The endobronchial ultrasound scope then was advanced there was a borderline enlarged node at the carina.  This level 7 node was sampled 4 times using a 21-gauge needle with real-time ultrasound visualization.  Samples were obtained both with and without suction applied.  Lymphoid cells were noted on the first specimen.  The remaining samples were sent for cellblock.  A level 11R node then was identified and sampling was performed again with real-time ultrasound visualization.  On the first biopsy blood was obtained.  Given the low index of suspicion on this note no additional samples were taken.  At the end of the procedure a general airway inspection was performed with the bronchoscope.  Blood was evacuated from the airways, and there was no evidence of active  bleeding. The bronchoscope was removed.  The patient tolerated the procedure well.  Postextubation the patient had a small amount of hemoptysis.  He observed in the operating room for a brief time and there was no ongoing hemoptysis.  He was taken to the postanesthetic care unit in good condition .  A post-procedural chest x-ray is pending.   Plans:  The patient will be discharged from the PACU to home when recovered from anesthesia and after chest x-ray is reviewed. We will review the cytology, pathology and microbiology results with the patient when they become available. Outpatient followup will be with Dr. Kerrin.

## 2024-07-30 NOTE — Interval H&P Note (Signed)
 History and Physical Interval Note:  07/30/2024 7:49 AM  Roy LITTIE Clause  has presented today for surgery, with the diagnosis of RLL LUNG MASS.  The various methods of treatment have been discussed with the patient and family. After consideration of risks, benefits and other options for treatment, the patient has consented to  Procedure(s) with comments: VIDEO BRONCHOSCOPY WITH ENDOBRONCHIAL NAVIGATION ([name]) BRONCHOSCOPY, WITH EBUS ([name]) - ROBOTIC BRONCHOSCOPY WITH EBUS as a surgical intervention.  The patient's history has been reviewed, patient examined, no change in status, stable for surgery.  I have reviewed the patient's chart and labs.  Questions were answered to the patient's satisfaction.     [name]

## 2024-07-30 NOTE — Transfer of Care (Signed)
 Immediate Anesthesia Transfer of Care Note  Patient: Roy [name]  Procedure(s) Performed: VIDEO BRONCHOSCOPY WITH ENDOBRONCHIAL NAVIGATION BRONCHOSCOPY, WITH EBUS BRONCHOSCOPY, WITH NEEDLE ASPIRATION BIOPSY BRONCHOSCOPY, WITH BIOPSY  Patient Location: PACU  Anesthesia Type:General  Level of Consciousness: awake and alert   Airway & Oxygen Therapy: Patient Spontanous Breathing and Patient connected to face mask oxygen  Post-op Assessment: Report given to RN and Post -op Vital signs reviewed and stable  Post vital signs: Reviewed and stable  Last Vitals:  Vitals Value Taken Time  BP 179/56 07/30/24 09:31  Temp 36.8 C 07/30/24 09:30  Pulse 77 07/30/24 09:36  Resp 17 07/30/24 09:36  SpO2 93 % 07/30/24 09:36  Vitals shown include unfiled device data.  Last Pain:  Vitals:   07/30/24 0602  TempSrc:   PainSc: 3       Patients Stated Pain Goal: 2 (07/30/24 0602)  Complications: No notable events documented.

## 2024-07-30 NOTE — Brief Op Note (Signed)
 07/30/2024  3:22 PM  PATIENT:  Roy [name]  78 y.o. male  PRE-OPERATIVE DIAGNOSIS:  RLL LUNG MASS  POST-OPERATIVE DIAGNOSIS:  NON-SMALL CELL CARCINOMA RIGHT LOWER LOBE  PROCEDURE:  Procedure(s) with comments: VIDEO BRONCHOSCOPY WITH ENDOBRONCHIAL NAVIGATION ([name]) BRONCHOSCOPY, WITH EBUS ([name]) - ROBOTIC BRONCHOSCOPY WITH EBUS BRONCHOSCOPY, WITH NEEDLE ASPIRATION BIOPSY BRONCHOSCOPY, WITH BIOPSY  SURGEON:  Surgeons and Role:    * Kerrin Elspeth BROCKS, MD - Primary  PHYSICIAN ASSISTANT:   ASSISTANTS: none   ANESTHESIA:   general  EBL:  30 mL   BLOOD ADMINISTERED:none  DRAINS: none   LOCAL MEDICATIONS USED:  NONE  SPECIMEN:  Source of Specimen:  RLL mass, 7 and 4R nodes  DISPOSITION OF SPECIMEN:  PATHOLOGY  COUNTS:  YES  TOURNIQUET:  * No tourniquets in log *  DICTATION: .Other Dictation: Dictation Number done  PLAN OF CARE: Discharge to home after PACU  PATIENT DISPOSITION:  PACU - hemodynamically stable.   Delay start of Pharmacological VTE agent (>24hrs) due to surgical blood loss or risk of bleeding: not applicable

## 2024-07-30 NOTE — Progress Notes (Signed)
 Ok to use a magnet during today's procedure per Dr. CHARLENA Needle.

## 2024-07-31 LAB — CYTOLOGY - NON PAP

## 2024-08-02 ENCOUNTER — Encounter (HOSPITAL_COMMUNITY): Payer: Self-pay | Admitting: Thoracic Surgery (Cardiothoracic Vascular Surgery)

## 2024-08-06 ENCOUNTER — Encounter: Payer: Self-pay | Admitting: Thoracic Surgery (Cardiothoracic Vascular Surgery)

## 2024-08-06 ENCOUNTER — Other Ambulatory Visit: Payer: Self-pay

## 2024-08-06 ENCOUNTER — Ambulatory Visit
Attending: Thoracic Surgery (Cardiothoracic Vascular Surgery) | Admitting: Thoracic Surgery (Cardiothoracic Vascular Surgery)

## 2024-08-06 VITALS — BP 164/76 | HR 60 | Resp 20 | Ht 63.0 in | Wt 146.5 lb

## 2024-08-06 DIAGNOSIS — R918 Other nonspecific abnormal finding of lung field: Secondary | ICD-10-CM | POA: Diagnosis not present

## 2024-08-06 NOTE — H&P (View-Only) (Signed)
 [address] [place] [id number]             [phone]     HPI: [name] returns to discuss the results of his navigational bronchoscopy and endobronchial ultrasound  Roy [name] is a 78 year old man with a history of tobacco abuse, emphysema, MI, left bundle branch block, congestive heart failure, ICD, ethanol abuse and withdrawal, cirrhosis, PAD, hypertension, hyperlipidemia, and thrombocytopenia.  He was found to have a 3.1 cm spiculated right lower lobe mass on a low-dose CT for lung cancer screening in May.  There were multiple additional nodules scattered bilaterally.   No chest pain, pressure, tightness, or shortness of breath with exertion.  Feels short of breath when he first lies flat on his back but that goes away after about a minute.  No change in appetite or weight loss.   Discussed with Dr. Alvan, he does not feel that any additional cardiac workup would be necessary prior to surgery.   I saw him in the office a couple weeks ago.  He actually did quite well on a 6-minute walk test going 1364 feet.  No desaturation.  PET/CT showed the nodule is hypermetabolic.  There was no evidence of metastatic disease.  Pulmonary function testing was adequate to tolerate lobectomy.  We previously discussed surgery versus radiation.  He wanted a more definitive diagnosis before he made a decision.  I did a robotic at bronchoscopy and endobronchial ultrasound on 07/30/2024.  He did well and went home the day of the procedure.  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  Zubrod Score: At the time of surgery this patient's most appropriate activity status/level should be described as: []     0    Normal activity, no symptoms [x]     1    Restricted in physical strenuous activity but ambulatory, able to do out light work []     2    Ambulatory and capable of self care, unable to do work activities, up and about >50 % of waking hours                               []     3    Only limited self care, in bed greater than 50% of waking hours []     4    Completely disabled, no self care, confined to bed or chair []     5    Moribund  Past Medical History:  Diagnosis Date   AICD (automatic cardioverter/defibrillator) present 08/19/2015   St Jude BiV ICD for primary prevention by Dr. Waddell   Alcohol abuse    6 beers per day; hospital admission in 2009 for withdrawal symptoms   Anxiety and depression    denies    Cerebrovascular disease 2009   TIA; 2009- right ICA stent; re-intervention for restenosis complicated by [facility] w/o sx   CHF (congestive heart failure) (HCC) 06/02/2014   Chronic obstructive pulmonary disease (HCC)    Degenerative joint disease    Total shoulder arthroplasty-right   Hyperlipidemia    Hypertension    LBBB (left bundle branch block)    Normal echo-2011; stress nuclear in 09/2010--septal hypoperfusion representing nontransmural infarction or the effect of left bundle branch block, no ischemia   Lung cancer (HCC)    Myocardial infarction (HCC) 06/02/2014   Massive Heart Attack   Peripheral vascular disease (HCC)  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   Thrombocytopenia (HCC)    Tobacco abuse    -100 pack years; 1.5 packs per day   Traumatic seroma of thigh (HCC)    left   Tubular adenoma of colon    Past Surgical History:  Procedure Laterality Date   ABDOMINAL AORTAGRAM [name] 05/04/2014   Procedure: ABDOMINAL EZELLA;  Surgeon: Gaile LELON New, MD;  Location: [facility] CATH LAB;  Service: Cardiovascular;  Laterality: [name];   ABDOMINAL AORTOGRAM [name] 06/25/2017   Procedure: Abdominal Aortogram;  Surgeon: New Gaile LELON, MD;  [facility] INVASIVE CV LAB;  Service: Cardiovascular;  Laterality: [name];   ABDOMINAL AORTOGRAM W/LOWER EXTREMITY [name] 04/22/2018   Procedure: ABDOMINAL AORTOGRAM W/LOWER EXTREMITY;  Surgeon: New Gaile LELON, MD;  [facility]: [facility];  Service: Cardiovascular;  Laterality: [name];    ABDOMINAL AORTOGRAM W/LOWER EXTREMITY [name] 03/08/2020   Procedure: ABDOMINAL AORTOGRAM W/LOWER EXTREMITY;  Surgeon: New Gaile LELON, MD;  [facility] LAB;  Service: Cardiovascular;  Laterality: [name];   ABDOMINAL AORTOGRAM W/LOWER EXTREMITY Bilateral 08/23/2020   Procedure: ABDOMINAL AORTOGRAM W/LOWER EXTREMITY;  Surgeon: New Gaile LELON, MD;  [facility] LAB;  Service: Cardiovascular;  Laterality: Bilateral;   ABDOMINAL AORTOGRAM W/LOWER EXTREMITY [name] 10/31/2021   Procedure: ABDOMINAL AORTOGRAM W/LOWER EXTREMITY;  Surgeon: New Gaile LELON, MD;  [facility];  Service: Cardiovascular;  Laterality: [name];   ABDOMINAL AORTOGRAM W/LOWER EXTREMITY [name] 12/19/2021   Procedure: ABDOMINAL AORTOGRAM W/LOWER EXTREMITY;  Surgeon: New Gaile LELON, MD;  [facility];  Service: Cardiovascular;  Laterality: [name];   ABDOMINAL AORTOGRAM W/LOWER EXTREMITY Bilateral 02/19/2023   Procedure: ABDOMINAL AORTOGRAM W/LOWER EXTREMITY;  Surgeon: New Gaile LELON, MD;  [facility];  Service: Cardiovascular;  Laterality: Bilateral;   AGILE CAPSULE [name] 03/18/2014   Procedure: AGILE CAPSULE;  Surgeon: Margo LITTIE Haddock, MD;  [facility]: [facility];  Service: Endoscopy;  Laterality: [name];  7:30   APPLICATION OF WOUND VAC Left 09/03/2017   Procedure: APPLICATION OF WOUND VAC;  Surgeon: Laurence Redell LITTIE, MD;  Location[facility] OR;  Service: Vascular;  Laterality: Left;   APPLICATION OF WOUND VAC Right 04/07/2020   Procedure: PLACEMENT OF ANTIBIOTIC BEADS AND APPLICATION OF WOUND VAC;  Surgeon: New Gaile LELON, MD;  [facility]: [facility] OR;  Service: Vascular;  Laterality: Right;   BACK SURGERY     BACTERIAL OVERGROWTH TEST [name] 05/24/2015   Procedure: BACTERIAL OVERGROWTH TEST;  Surgeon: Margo LITTIE Haddock, MD;  [facility]: [facility];  Service: Endoscopy;  Laterality: [name];  0700   BI-VENTRICULAR IMPLANTABLE CARDIOVERTER DEFIBRILLATOR  (CRT-D)  08/19/2015   BIV ICD GENERATOR CHANGEOUT [name]  11/11/2023   Procedure: BIV ICD GENERATOR CHANGEOUT;  Surgeon: Waddell Danelle LELON, MD;  Location: [facility] INVASIVE CV LAB;  Service: Cardiovascular;  Laterality: [name];   BRONCHIAL BIOPSY  07/30/2024   Procedure: BRONCHOSCOPY, WITH BIOPSY;  Surgeon: Kerrin Elspeth BROCKS, MD;  Location: [facility] ENDOSCOPY;  Service: Thoracic;;   BRONCHIAL NEEDLE ASPIRATION BIOPSY  07/30/2024   Procedure: BRONCHOSCOPY, WITH NEEDLE ASPIRATION BIOPSY;  Surgeon: Kerrin Elspeth BROCKS, MD;  Location: [facility] ENDOSCOPY;  Service: Thoracic;;   CARPAL TUNNEL RELEASE Left 02/02/2016   Procedure: LEFT CARPAL TUNNEL RELEASE;  Surgeon: Arley Curia, MD;  Location: [facility];  Service: Orthopedics;  Laterality: Left;  ANESTHESIA: IV REGIONAL UPPER ARM   CATARACT EXTRACTION W/PHACO Right 03/08/2015   Procedure: CATARACT EXTRACTION PHACO AND INTRAOCULAR LENS PLACEMENT (IOC);  Surgeon: Oneil Platts,  MD;  Location: [facility] ORS;  Service: Ophthalmology;  Laterality: Right;  CDE:9.46   CATARACT EXTRACTION W/PHACO Left 03/22/2015   Procedure: CATARACT EXTRACTION PHACO AND INTRAOCULAR LENS PLACEMENT (IOC);  Surgeon: Oneil Platts, MD;  Location: [facility] OR[facility];  Service: Ophthalmology;  Laterality: Left;  CDE:5.80   COLONOSCOPY  08/22/09   Fields-(Tubular Adenoma)3-mm transverse polyp/4-mm polyp otherwise noraml/small internal hemorrhoids   COLONOSCOPY [name] 04/14/2014   DOQ:dfjoo internal hemorrhids/normal mocsa in the terminal iluem/left colonis redundant   COLONOSCOPY [name] 07/01/2024   Procedure: COLONOSCOPY;  Surgeon: Shaaron Lamar HERO, MD;  [facility];  Service: Endoscopy;  Laterality: [name];  900am, asa 3   COLONOSCOPY W/ POLYPECTOMY  2011   ENDARTERECTOMY FEMORAL Left 08/16/2017   Procedure: ENDARTERECTOMY LEFT PROFUNDA FEMORAL;  Surgeon: Serene Gaile ORN, MD;  Location: [facility] OR;  Service: Vascular;  Laterality: Left;   ENDARTERECTOMY FEMORAL Right 03/31/2020   Procedure: RIGHT FEMORAL ENDARTERECTOMY;  Surgeon: Serene Gaile ORN, MD;  [facility] OR;   Service: Vascular;  Laterality: Right;   EP IMPLANTABLE DEVICE [name] 08/19/2015   Procedure: BiV ICD Insertion CRT-D;  Surgeon: Danelle ORN Birmingham, MD;  Location: [facility] INVASIVE CV LAB;  Service: Cardiovascular;  Laterality: [name];   ESOPHAGOGASTRODUODENOSCOPY [name] 03/05/2014   SLF: 1. Stricture at the gastroesophagael junction 2. Small hiatal hernia 3. Moderate non-erosive gastritis and duodentitis. 4. No surce for Melena identified.    FEMORAL-POPLITEAL BYPASS GRAFT Left 08/16/2017   Procedure: LEFT  FEMORAL-POPLITEAL ARTERY  BYPASS GRAFT;  Surgeon: Serene Gaile ORN, MD;  [facility];  Service: Vascular;  Laterality: Left;   GIVENS CAPSULE STUDY [name] 03/30/2014   Procedure: GIVENS CAPSULE STUDY;  Surgeon: Margo LITTIE Haddock, MD;  [facility];  Service: Endoscopy;  Laterality: [name];  7:30   GROIN DEBRIDEMENT Right 04/07/2020   Procedure: INCISION AND DRAINAGE OF RIGHT GROIN;  Surgeon: Serene Gaile ORN, MD;  [facility];  Service: Vascular;  Laterality: Right;   I & D EXTREMITY Left 09/03/2017   Procedure: IRRIGATION AND DEBRIDEMENT EXTREMITY LEFT THIGH SEROMA;  Surgeon: Laurence Redell LITTIE, MD;  Location: [facility];  Service: Vascular;  Laterality: Left;   INSERT / REPLACE / REMOVE PACEMAKER     INSERTION OF ILIAC STENT  03/31/2020   Procedure: INSERTION OF RIGHT ILIAC ARTERY STENT AND RIGHT SUPERFICIAL FEMORAL ARTERY STENT;  Surgeon: Serene Gaile ORN, MD;  [facility];  Service: Vascular;;   IR ANGIO INTRA EXTRACRAN SEL COM CAROTID INNOMINATE BILAT MOD SED  07/16/2017   IR ANGIO INTRA EXTRACRAN SEL COM CAROTID INNOMINATE BILAT MOD SED  06/12/2019   IR ANGIO INTRA EXTRACRAN SEL COM CAROTID INNOMINATE BILAT MOD SED  01/17/2021   IR ANGIO VERTEBRAL SEL SUBCLAVIAN INNOMINATE BILAT MOD SED  07/16/2017   IR ANGIO VERTEBRAL SEL SUBCLAVIAN INNOMINATE BILAT MOD SED  06/12/2019   IR ANGIO VERTEBRAL SEL SUBCLAVIAN INNOMINATE BILAT MOD SED  01/17/2021   IR RADIOLOGIST EVAL & MGMT  04/01/2017   IR RADIOLOGIST EVAL & MGMT   08/02/2017   IR TRANSCATH EXCRAN VERT OR CAR A STENT  07/22/2019   IR US  GUIDE VASC ACCESS RIGHT  06/12/2019   IR US  GUIDE VASC ACCESS RIGHT  01/17/2021   JOINT REPLACEMENT Right    Total Shoulder Replacement    LOWER EXTREMITY ANGIOGRAPHY Bilateral 06/25/2017   Procedure: Lower Extremity Angiography;  Surgeon: Serene Gaile ORN, MD;  [facility];  Service: Cardiovascular;  Laterality: Bilateral;   LUMBAR FUSION  2010  PATCH ANGIOPLASTY Right 03/31/2020   Procedure: PATCH ANGIOPLASTY USING XENOSURE BIOLOGIC PATCH 1cm x 14cm;  Surgeon: Serene Gaile ORN, MD;  Location: [facility] OR;  Service: Vascular;  Laterality: Right;   PERIPHERAL VASCULAR ATHERECTOMY Right 04/22/2018   Procedure: PERIPHERAL VASCULAR ATHERECTOMY;  Surgeon: Serene Gaile ORN, MD;  [facility];  Service: Cardiovascular;  Laterality: Right;  right superficial femoral   PERIPHERAL VASCULAR BALLOON ANGIOPLASTY Right 04/22/2018   Procedure: PERIPHERAL VASCULAR BALLOON ANGIOPLASTY;  Surgeon: Serene Gaile ORN, MD;  [facility];  Service: Cardiovascular;  Laterality: Right;  external iliac   PERIPHERAL VASCULAR BALLOON ANGIOPLASTY Left 03/08/2020   Procedure: PERIPHERAL VASCULAR BALLOON ANGIOPLASTY;  Surgeon: Serene Gaile ORN, MD;  [facility];  Service: Cardiovascular;  Laterality: Left;  Common Femoral   PERIPHERAL VASCULAR BALLOON ANGIOPLASTY Left 08/23/2020   Procedure: PERIPHERAL VASCULAR BALLOON ANGIOPLASTY;  Surgeon: Serene Gaile ORN, MD;  [facility];  Service: Cardiovascular;  Laterality: Left;  common femoral   PERIPHERAL VASCULAR BALLOON ANGIOPLASTY Left 10/31/2021   Procedure: PERIPHERAL VASCULAR BALLOON ANGIOPLASTY;  Surgeon: Serene Gaile ORN, MD;  [facility];  Service: Cardiovascular;  Laterality: Left;  external iliac   PERIPHERAL VASCULAR INTERVENTION Right 03/08/2020   Procedure: PERIPHERAL VASCULAR INTERVENTION;  Surgeon: Serene Gaile ORN, MD;  [facility];  Service: Cardiovascular;  Laterality: Right;  SFA   PERIPHERAL VASCULAR INTERVENTION Right 08/23/2020   Procedure: PERIPHERAL VASCULAR INTERVENTION;  Surgeon: Serene Gaile ORN, MD;  [facility];  Service: Cardiovascular;  Laterality: Right;  external iliac   PERIPHERAL VASCULAR INTERVENTION Right 10/31/2021   Procedure: PERIPHERAL VASCULAR INTERVENTION;  Surgeon: Serene Gaile ORN, MD;  [facility];  Service: Cardiovascular;  Laterality: Right;  superficial femoral artery   PERIPHERAL VASCULAR INTERVENTION Left 12/19/2021   Procedure: PERIPHERAL VASCULAR INTERVENTION;  Surgeon: Serene Gaile ORN, MD;  [facility];  Service: Cardiovascular;  Laterality: Left;  stent lt ext iliac / pta common femoral   PERIPHERAL VASCULAR INTERVENTION  02/19/2023   Procedure: PERIPHERAL VASCULAR INTERVENTION;  Surgeon: Serene Gaile ORN, MD;  [facility];  Service: Cardiovascular;;   RADIOLOGY WITH ANESTHESIA [name] 07/01/2019   Procedure: STENTING;  Surgeon: Dolphus Carrion, MD;  [facility];  Service: Radiology;  Laterality: [name];   RADIOLOGY WITH ANESTHESIA [name] 07/22/2019   Procedure: STENTING;  Surgeon: Dolphus Carrion, MD;  [facility];  Service: Radiology;  Laterality: [name];   TOTAL SHOULDER ARTHROPLASTY Right 2011   Dr. Dozier COWBOY NERVE TRANSPOSITION Left 02/02/2016   Procedure: LEFT IN-SITU DECOMPRESSION ULNAR NERVE ;  Surgeon: Arley Curia, MD;  Location: [facility];  Service: Orthopedics;  Laterality: Left;   ULNAR TUNNEL RELEASE Left 02/02/2016   Procedure: LEFT CUBITAL TUNNEL RELEASE;  Surgeon: Arley Curia, MD;  Location: [facility];  Service: Orthopedics;  Laterality: Left;   VASECTOMY  1971   VEIN HARVEST Left 08/16/2017   Procedure: USING NON REVERSE LEFT GREATER SAPHENOUS VEIN HARVEST;  Surgeon: Serene Gaile ORN, MD;  [facility];  Service: Vascular;  Laterality:  Left;   VIDEO BRONCHOSCOPY WITH ENDOBRONCHIAL NAVIGATION [name] 07/30/2024   Procedure: VIDEO BRONCHOSCOPY WITH ENDOBRONCHIAL NAVIGATION;  Surgeon: Kerrin Elspeth BROCKS, MD;  [facility] ENDOS[facility];  Service: Thoracic;  Laterality: [name];   VIDEO BRONCHOSCOPY WITH ENDOBRONCHIAL ULTRASOUND [name] 07/30/2024   Procedure: BRONCHOSCOPY, WITH EBUS;  Surgeon: Kerrin Elspeth  C, MD;  Location: [facility]OS[facility];  Service: Thoracic;  Laterality: [name];  ROBOTIC BRONCHOSCOPY WITH EBUS      Current Outpatient Medications  Medication Sig Dispense Refill   aspirin  EC 81 MG tablet Take 81 mg by mouth every evening.      atorvastatin  (LIPITOR ) 20 MG tablet Take 20 mg by mouth every evening.     BRILINTA  90 MG TABS tablet Take 90 mg by mouth 2 (two) times daily.     carvedilol  (COREG ) 6.25 MG tablet TAKE 1 TABLET TWICE A DAY WITH MEALS (DOSE CHANGE, NEW PRESCRIPTION) 180 tablet 2   cetirizine (ZYRTEC) 10 MG tablet Take 10 mg by mouth daily.     clotrimazole -betamethasone  (LOTRISONE ) cream Apply 1 Application topically 2 (two) times daily. 30 g 3   Cobalamin Combinations (NEURIVA PLUS) CAPS Take 1 capsule by mouth daily.     dextromethorphan -guaiFENesin  (MUCINEX  DM) 30-600 MG 12hr tablet Take 1 tablet by mouth 2 (two) times daily.     gabapentin  (NEURONTIN ) 300 MG capsule Take 300 mg by mouth 2 (two) times daily.      HYDROcodone -acetaminophen  (NORCO) 10-325 MG tablet Take 1 tablet by mouth at bedtime.     HYDROcodone -acetaminophen  (NORCO/VICODIN) 5-325 MG tablet Take 1-2 tablets by mouth See admin instructions. Take 2 tablets in the morning and evening, may take 1 tablet at lunch as needed for pain     Magnesium  250 MG TABS Take 250 mg by mouth every evening.     Multiple Vitamins-Minerals (CENTRUM SILVER PO) Take 1 tablet by mouth daily. 50+     Naphazoline HCl (CLEAR EYES OP) Place 1 drop into both eyes daily as needed (dry eyes).     Omega-3 Fatty Acids (FISH OIL ) 1000 MG CAPS Take 1,000 mg by mouth every evening.      sacubitril -valsartan  (ENTRESTO ) 97-103 MG Take 1 tablet by mouth 2 (two) times daily. 60 tablet 11   Salicylic Acid (GOLD BOND PSORIASIS RELIEF) 3 % CREA Apply 1 application topically daily as needed (psoriasis).     silodosin (RAPAFLO) 8 MG CAPS capsule Take 8 mg by mouth daily with breakfast.     SUPER B COMPLEX/C PO Take 1 tablet by mouth every evening.     tamsulosin  (FLOMAX ) 0.4 MG CAPS capsule Take 0.4 mg by mouth daily after supper.     traMADol (ULTRAM) 50 MG tablet Take 50 mg by mouth 2 (two) times daily as needed for moderate pain (pain score 4-6).     No current facility-administered medications for this visit.    Physical Exam BP (!) 164/76 (BP Location: Left Arm, Patient Position: Sitting, Cuff Size: Normal)   Pulse 60   Resp 20   Ht 5' 3 (1.6 m)   Wt 146 lb 8 oz (66.5 kg)   SpO2 98% Comment: RA  BMI 25.13 kg/m  78 year old man in no acute distress Alert and oriented x 3 with no focal deficits No cervical or supraclavicular adenopathy Lungs clear bilaterally, no wheezing Cardiac regular rate and rhythm, normal S1 and S2 No clubbing, cyanosis, or edema   Diagnostic Tests: NUCLEAR MEDICINE PET SKULL BASE TO THIGH   TECHNIQUE: 7.22 mCi F-18 FDG was injected intravenously. Full-ring PET imaging was performed from the skull base to thigh after the radiotracer. CT data was obtained and used for attenuation correction and anatomic localization.   Fasting blood glucose: 93 mg/dl   COMPARISON:  Chest CT 04/11/2024   FINDINGS: Mediastinal blood pool activity: SUV max 2.1  NECK:   No hypermetabolic cervical lymph nodes are identified. No suspicious activity identified within the pharyngeal mucosal space.   Incidental CT findings: Severe bilateral carotid atherosclerosis.   CHEST:   There are no hypermetabolic mediastinal, hilar or axillary lymph nodes. There is marked hypermetabolic activity within the previously demonstrated spiculated right lower lobe  mass. This mass measures 3.4 x 2.9 cm on image 50/7 and has an SUV max of 16.2. Additional scattered pulmonary nodules are grossly stable, without additional hypermetabolic activity.   Incidental CT findings: Left subclavian pacemaker. Diffuse atherosclerosis of the aorta, great vessels and coronary arteries. Moderate centrilobular and paraseptal emphysema.   ABDOMEN/PELVIS:   There is no hypermetabolic activity within the liver, adrenal glands, spleen or pancreas. There is no hypermetabolic nodal activity in the abdomen or pelvis.   Incidental CT findings: Diffuse aortic and branch vessel atherosclerosis. Bilateral iliac and right femoral arterial stents.   SKELETON:   There is no hypermetabolic activity to suggest osseous metastatic disease.   Incidental CT findings: Right shoulder arthroplasty. Mild multilevel spondylosis.   IMPRESSION: 1. The previously demonstrated spiculated right lower lobe mass is hypermetabolic, consistent with primary bronchogenic carcinoma. 2. No evidence of metastatic disease. Additional scattered pulmonary nodules are grossly stable, without hypermetabolic activity. Although likely benign, these are suboptimally evaluated based on size. Attention on follow-up recommended. 3. Aortic Atherosclerosis (ICD10-I70.0) and Emphysema (ICD10-J43.9).     Electronically Signed   By: Elsie Perone M.D.   On: 07/02/2024 14:27   Pulmonary function testing 07/08/2024 FVC 3.57 (117%) FEV1 2.34 (109%) DLCO 8.94 (45%)   Impression: Roy [name] is a 77 year old man with a history of tobacco abuse, emphysema, MI, left bundle branch block, congestive heart failure, ICD, ethanol abuse and withdrawal, cirrhosis, PAD, hypertension, hyperlipidemia, thrombocytopenia, and newly diagnosed stage IB squamous cell carcinoma of the right lower lobe.   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  Discussed options for treatment  including surgical resection and radiation.  We discussed the relative advantages and disadvantages of each approach.  He does have multiple risk factors for surgery but does have adequate pulmonary reserve and exercise tolerance to tolerate a lobectomy.  I informed him of the general nature of the procedure including the need for general anesthesia, the incisions to be used, the use of the surgical robot, the possible need for conversion to open, the use of a drainage tube postoperatively, the expected hospital stay, and the overall recovery.  I informed him of the indications, risks, benefits, and alternatives.  He understands the risks include, but not limited to death, MI, DVT, PE, bleeding, possible need for transfusion, infection, prolonged air leak, cardiac arrhythmias, as well as possibility of other unforeseeable complications.  He accepts the risk and wishes to proceed with surgery.  Plan: Robotic assisted right lower lobectomy on Friday, 08/14/2024 Hold Brilinta  for 5 days prior to surgery He knows to also hold Entresto  and fish oil   [name], MD [facility] [phone]

## 2024-08-06 NOTE — Progress Notes (Signed)
 [address] [place] [id number]             [phone]     HPI: Mr. [name] returns to discuss the results of his navigational bronchoscopy and endobronchial ultrasound  Fabiano Ginley is a 78 year old man with a history of tobacco abuse, emphysema, MI, left bundle branch block, congestive heart failure, ICD, ethanol abuse and withdrawal, cirrhosis, PAD, hypertension, hyperlipidemia, and thrombocytopenia.  He was found to have a 3.1 cm spiculated right lower lobe mass on a low-dose CT for lung cancer screening in May.  There were multiple additional nodules scattered bilaterally.   No chest pain, pressure, tightness, or shortness of breath with exertion.  Feels short of breath when he first lies flat on his back but that goes away after about a minute.  No change in appetite or weight loss.   Discussed with Dr. Alvan, he does not feel that any additional cardiac workup would be necessary prior to surgery.   I saw him in the office a couple weeks ago.  He actually did quite well on a 6-minute walk test going 1364 feet.  No desaturation.  PET/CT showed the nodule is hypermetabolic.  There was no evidence of metastatic disease.  Pulmonary function testing was adequate to tolerate lobectomy.  We previously discussed surgery versus radiation.  He wanted a more definitive diagnosis before he made a decision.  I did a robotic at bronchoscopy and endobronchial ultrasound on 07/30/2024.  He did well and went home the day of the procedure.  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  Zubrod Score: At the time of surgery this patient's most appropriate activity status/level should be described as: []     0    Normal activity, no symptoms [x]     1    Restricted in physical strenuous activity but ambulatory, able to do out light work []     2    Ambulatory and capable of self care, unable to do work activities, up and about >50 % of waking hours                               []     3    Only limited self care, in bed greater than 50% of waking hours []     4    Completely disabled, no self care, confined to bed or chair []     5    Moribund  Past Medical History:  Diagnosis Date   AICD (automatic cardioverter/defibrillator) present 08/19/2015   St Jude BiV ICD for primary prevention by Dr. Waddell   Alcohol abuse    6 beers per day; hospital admission in 2009 for withdrawal symptoms   Anxiety and depression    denies    Cerebrovascular disease 2009   TIA; 2009- right ICA stent; re-intervention for restenosis complicated by [facility] w/o sx   CHF (congestive heart failure) (HCC) 06/02/2014   Chronic obstructive pulmonary disease (HCC)    Degenerative joint disease    Total shoulder arthroplasty-right   Hyperlipidemia    Hypertension    LBBB (left bundle branch block)    Normal echo-2011; stress nuclear in 09/2010--septal hypoperfusion representing nontransmural infarction or the effect of left bundle branch block, no ischemia   Lung cancer (HCC)    Myocardial infarction (HCC) 06/02/2014   Massive Heart Attack   Peripheral vascular disease (HCC)  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   Thrombocytopenia (HCC)    Tobacco abuse    -100 pack years; 1.5 packs per day   Traumatic seroma of thigh (HCC)    left   Tubular adenoma of colon    Past Surgical History:  Procedure Laterality Date   ABDOMINAL AORTAGRAM [name] 05/04/2014   Procedure: ABDOMINAL EZELLA;  Surgeon: Gaile LELON New, MD;  Location: [facility] CATH LAB;  Service: Cardiovascular;  Laterality: [name];   ABDOMINAL AORTOGRAM [name] 06/25/2017   Procedure: Abdominal Aortogram;  Surgeon: New Gaile LELON, MD;  [facility]: [facility] INVASIVE CV LAB;  Service: Cardiovascular;  Laterality: [name];   ABDOMINAL AORTOGRAM W/LOWER EXTREMITY [name] 04/22/2018   Procedure: ABDOMINAL AORTOGRAM W/LOWER EXTREMITY;  Surgeon: New Gaile LELON, MD;  [facility]: [facility];  Service: Cardiovascular;  Laterality: [name];    ABDOMINAL AORTOGRAM W/LOWER EXTREMITY [name] 03/08/2020   Procedure: ABDOMINAL AORTOGRAM W/LOWER EXTREMITY;  Surgeon: New Gaile LELON, MD;  [facility] LAB;  Service: Cardiovascular;  Laterality: [name];   ABDOMINAL AORTOGRAM W/LOWER EXTREMITY Bilateral 08/23/2020   Procedure: ABDOMINAL AORTOGRAM W/LOWER EXTREMITY;  Surgeon: New Gaile LELON, MD;  [facility]: [facility] CV LAB;  Service: Cardiovascular;  Laterality: Bilateral;   ABDOMINAL AORTOGRAM W/LOWER EXTREMITY [name] 10/31/2021   Procedure: ABDOMINAL AORTOGRAM W/LOWER EXTREMITY;  Surgeon: New Gaile LELON, MD;  [facility];  Service: Cardiovascular;  Laterality: [name];   ABDOMINAL AORTOGRAM W/LOWER EXTREMITY [name] 12/19/2021   Procedure: ABDOMINAL AORTOGRAM W/LOWER EXTREMITY;  Surgeon: New Gaile LELON, MD;  [facility];  Service: Cardiovascular;  Laterality: [name];   ABDOMINAL AORTOGRAM W/LOWER EXTREMITY Bilateral 02/19/2023   Procedure: ABDOMINAL AORTOGRAM W/LOWER EXTREMITY;  Surgeon: New Gaile LELON, MD;  [facility];  Service: Cardiovascular;  Laterality: Bilateral;   AGILE CAPSULE [name] 03/18/2014   Procedure: AGILE CAPSULE;  Surgeon: Margo LITTIE Haddock, MD;  Location: [facility];  Service: Endoscopy;  Laterality: [name];  7:30   APPLICATION OF WOUND VAC Left 09/03/2017   Procedure: APPLICATION OF WOUND VAC;  Surgeon: Laurence Redell LITTIE, MD;  Location[facility] OR;  Service: Vascular;  Laterality: Left;   APPLICATION OF WOUND VAC Right 04/07/2020   Procedure: PLACEMENT OF ANTIBIOTIC BEADS AND APPLICATION OF WOUND VAC;  Surgeon: New Gaile LELON, MD;  [facility]: [facility] OR;  Service: Vascular;  Laterality: Right;   BACK SURGERY     BACTERIAL OVERGROWTH TEST [name] 05/24/2015   Procedure: BACTERIAL OVERGROWTH TEST;  Surgeon: Margo LITTIE Haddock, MD;  [facility]: [facility];  Service: Endoscopy;  Laterality: [name];  0700   BI-VENTRICULAR IMPLANTABLE CARDIOVERTER DEFIBRILLATOR  (CRT-D)  08/19/2015   BIV ICD GENERATOR CHANGEOUT [name]  11/11/2023   Procedure: BIV ICD GENERATOR CHANGEOUT;  Surgeon: Waddell Danelle LELON, MD;  Location: [facility] INVASIVE CV LAB;  Service: Cardiovascular;  Laterality: [name];   BRONCHIAL BIOPSY  07/30/2024   Procedure: BRONCHOSCOPY, WITH BIOPSY;  Surgeon: Kerrin Elspeth BROCKS, MD;  Location: [facility] ENDOSCOPY;  Service: Thoracic;;   BRONCHIAL NEEDLE ASPIRATION BIOPSY  07/30/2024   Procedure: BRONCHOSCOPY, WITH NEEDLE ASPIRATION BIOPSY;  Surgeon: Kerrin Elspeth BROCKS, MD;  Location: [facility] ENDOSCOPY;  Service: Thoracic;;   CARPAL TUNNEL RELEASE Left 02/02/2016   Procedure: LEFT CARPAL TUNNEL RELEASE;  Surgeon: Arley Curia, MD;  Location: [facility];  Service: Orthopedics;  Laterality: Left;  ANESTHESIA: IV REGIONAL UPPER ARM   CATARACT EXTRACTION W/PHACO Right 03/08/2015   Procedure: CATARACT EXTRACTION PHACO AND INTRAOCULAR LENS PLACEMENT (IOC);  Surgeon: Oneil Platts,  MD;  Location: [facility] ORS;  Service: Ophthalmology;  Laterality: Right;  CDE:9.46   CATARACT EXTRACTION W/PHACO Left 03/22/2015   Procedure: CATARACT EXTRACTION PHACO AND INTRAOCULAR LENS PLACEMENT (IOC);  Surgeon: Oneil Platts, MD;  Location: [facility] OR[facility];  Service: Ophthalmology;  Laterality: Left;  CDE:5.80   COLONOSCOPY  08/22/09   Fields-(Tubular Adenoma)3-mm transverse polyp/4-mm polyp otherwise noraml/small internal hemorrhoids   COLONOSCOPY [name] 04/14/2014   DOQ:dfjoo internal hemorrhids/normal mocsa in the terminal iluem/left colonis redundant   COLONOSCOPY [name] 07/01/2024   Procedure: COLONOSCOPY;  Surgeon: Shaaron Lamar HERO, MD;  [facility];  Service: Endoscopy;  Laterality: [name];  900am, asa 3   COLONOSCOPY W/ POLYPECTOMY  2011   ENDARTERECTOMY FEMORAL Left 08/16/2017   Procedure: ENDARTERECTOMY LEFT PROFUNDA FEMORAL;  Surgeon: Serene Gaile ORN, MD;  Location: [facility] OR;  Service: Vascular;  Laterality: Left;   ENDARTERECTOMY FEMORAL Right 03/31/2020   Procedure: RIGHT FEMORAL ENDARTERECTOMY;  Surgeon: Serene Gaile ORN, MD;  [facility] OR;   Service: Vascular;  Laterality: Right;   EP IMPLANTABLE DEVICE [name] 08/19/2015   Procedure: BiV ICD Insertion CRT-D;  Surgeon: Danelle ORN Birmingham, MD;  Location: [facility] INVASIVE CV LAB;  Service: Cardiovascular;  Laterality: [name];   ESOPHAGOGASTRODUODENOSCOPY [name] 03/05/2014   SLF: 1. Stricture at the gastroesophagael junction 2. Small hiatal hernia 3. Moderate non-erosive gastritis and duodentitis. 4. No surce for Melena identified.    FEMORAL-POPLITEAL BYPASS GRAFT Left 08/16/2017   Procedure: LEFT  FEMORAL-POPLITEAL ARTERY  BYPASS GRAFT;  Surgeon: Serene Gaile ORN, MD;  [facility];  Service: Vascular;  Laterality: Left;   GIVENS CAPSULE STUDY [name] 03/30/2014   Procedure: GIVENS CAPSULE STUDY;  Surgeon: Margo LITTIE Haddock, MD;  [facility];  Service: Endoscopy;  Laterality: [name];  7:30   GROIN DEBRIDEMENT Right 04/07/2020   Procedure: INCISION AND DRAINAGE OF RIGHT GROIN;  Surgeon: Serene Gaile ORN, MD;  [facility];  Service: Vascular;  Laterality: Right;   I & D EXTREMITY Left 09/03/2017   Procedure: IRRIGATION AND DEBRIDEMENT EXTREMITY LEFT THIGH SEROMA;  Surgeon: Laurence Redell LITTIE, MD;  Location: [facility];  Service: Vascular;  Laterality: Left;   INSERT / REPLACE / REMOVE PACEMAKER     INSERTION OF ILIAC STENT  03/31/2020   Procedure: INSERTION OF RIGHT ILIAC ARTERY STENT AND RIGHT SUPERFICIAL FEMORAL ARTERY STENT;  Surgeon: Serene Gaile ORN, MD;  [facility];  Service: Vascular;;   IR ANGIO INTRA EXTRACRAN SEL COM CAROTID INNOMINATE BILAT MOD SED  07/16/2017   IR ANGIO INTRA EXTRACRAN SEL COM CAROTID INNOMINATE BILAT MOD SED  06/12/2019   IR ANGIO INTRA EXTRACRAN SEL COM CAROTID INNOMINATE BILAT MOD SED  01/17/2021   IR ANGIO VERTEBRAL SEL SUBCLAVIAN INNOMINATE BILAT MOD SED  07/16/2017   IR ANGIO VERTEBRAL SEL SUBCLAVIAN INNOMINATE BILAT MOD SED  06/12/2019   IR ANGIO VERTEBRAL SEL SUBCLAVIAN INNOMINATE BILAT MOD SED  01/17/2021   IR RADIOLOGIST EVAL & MGMT  04/01/2017   IR RADIOLOGIST EVAL & MGMT   08/02/2017   IR TRANSCATH EXCRAN VERT OR CAR A STENT  07/22/2019   IR US  GUIDE VASC ACCESS RIGHT  06/12/2019   IR US  GUIDE VASC ACCESS RIGHT  01/17/2021   JOINT REPLACEMENT Right    Total Shoulder Replacement    LOWER EXTREMITY ANGIOGRAPHY Bilateral 06/25/2017   Procedure: Lower Extremity Angiography;  Surgeon: Serene Gaile ORN, MD;  [facility];  Service: Cardiovascular;  Laterality: Bilateral;   LUMBAR FUSION  2010  PATCH ANGIOPLASTY Right 03/31/2020   Procedure: PATCH ANGIOPLASTY USING XENOSURE BIOLOGIC PATCH 1cm x 14cm;  Surgeon: Serene Gaile ORN, MD;  Location: [facility] OR;  Service: Vascular;  Laterality: Right;   PERIPHERAL VASCULAR ATHERECTOMY Right 04/22/2018   Procedure: PERIPHERAL VASCULAR ATHERECTOMY;  Surgeon: Serene Gaile ORN, MD;  [facility];  Service: Cardiovascular;  Laterality: Right;  right superficial femoral   PERIPHERAL VASCULAR BALLOON ANGIOPLASTY Right 04/22/2018   Procedure: PERIPHERAL VASCULAR BALLOON ANGIOPLASTY;  Surgeon: Serene Gaile ORN, MD;  [facility];  Service: Cardiovascular;  Laterality: Right;  external iliac   PERIPHERAL VASCULAR BALLOON ANGIOPLASTY Left 03/08/2020   Procedure: PERIPHERAL VASCULAR BALLOON ANGIOPLASTY;  Surgeon: Serene Gaile ORN, MD;  [facility];  Service: Cardiovascular;  Laterality: Left;  Common Femoral   PERIPHERAL VASCULAR BALLOON ANGIOPLASTY Left 08/23/2020   Procedure: PERIPHERAL VASCULAR BALLOON ANGIOPLASTY;  Surgeon: Serene Gaile ORN, MD;  [facility];  Service: Cardiovascular;  Laterality: Left;  common femoral   PERIPHERAL VASCULAR BALLOON ANGIOPLASTY Left 10/31/2021   Procedure: PERIPHERAL VASCULAR BALLOON ANGIOPLASTY;  Surgeon: Serene Gaile ORN, MD;  [facility];  Service: Cardiovascular;  Laterality: Left;  external iliac   PERIPHERAL VASCULAR INTERVENTION Right 03/08/2020   Procedure: PERIPHERAL VASCULAR INTERVENTION;  Surgeon: Serene Gaile ORN, MD;  [facility];  Service: Cardiovascular;  Laterality: Right;  SFA   PERIPHERAL VASCULAR INTERVENTION Right 08/23/2020   Procedure: PERIPHERAL VASCULAR INTERVENTION;  Surgeon: Serene Gaile ORN, MD;  [facility];  Service: Cardiovascular;  Laterality: Right;  external iliac   PERIPHERAL VASCULAR INTERVENTION Right 10/31/2021   Procedure: PERIPHERAL VASCULAR INTERVENTION;  Surgeon: Serene Gaile ORN, MD;  [facility];  Service: Cardiovascular;  Laterality: Right;  superficial femoral artery   PERIPHERAL VASCULAR INTERVENTION Left 12/19/2021   Procedure: PERIPHERAL VASCULAR INTERVENTION;  Surgeon: Serene Gaile ORN, MD;  [facility];  Service: Cardiovascular;  Laterality: Left;  stent lt ext iliac / pta common femoral   PERIPHERAL VASCULAR INTERVENTION  02/19/2023   Procedure: PERIPHERAL VASCULAR INTERVENTION;  Surgeon: Serene Gaile ORN, MD;  [facility];  Service: Cardiovascular;;   RADIOLOGY WITH ANESTHESIA [name] 07/01/2019   Procedure: STENTING;  Surgeon: Dolphus Carrion, MD;  [facility];  Service: Radiology;  Laterality: [name];   RADIOLOGY WITH ANESTHESIA [name] 07/22/2019   Procedure: STENTING;  Surgeon: Dolphus Carrion, MD;  [facility];  Service: Radiology;  Laterality: [name];   TOTAL SHOULDER ARTHROPLASTY Right 2011   Dr. Dozier COWBOY NERVE TRANSPOSITION Left 02/02/2016   Procedure: LEFT IN-SITU DECOMPRESSION ULNAR NERVE ;  Surgeon: Arley Curia, MD;  Location: [facility];  Service: Orthopedics;  Laterality: Left;   ULNAR TUNNEL RELEASE Left 02/02/2016   Procedure: LEFT CUBITAL TUNNEL RELEASE;  Surgeon: Arley Curia, MD;  Location: [facility];  Service: Orthopedics;  Laterality: Left;   VASECTOMY  1971   VEIN HARVEST Left 08/16/2017   Procedure: USING NON REVERSE LEFT GREATER SAPHENOUS VEIN HARVEST;  Surgeon: Serene Gaile ORN, MD;  [facility];  Service: Vascular;  Laterality:  Left;   VIDEO BRONCHOSCOPY WITH ENDOBRONCHIAL NAVIGATION [name] 07/30/2024   Procedure: VIDEO BRONCHOSCOPY WITH ENDOBRONCHIAL NAVIGATION;  Surgeon: Kerrin Elspeth BROCKS, MD;  [facility] ENDOS[facility];  Service: Thoracic;  Laterality: [name];   VIDEO BRONCHOSCOPY WITH ENDOBRONCHIAL ULTRASOUND [name] 07/30/2024   Procedure: BRONCHOSCOPY, WITH EBUS;  Surgeon: Kerrin Elspeth  C, MD;  Location: [facility] ENDOS[facility];  Service: Thoracic;  Laterality: [name];  ROBOTIC BRONCHOSCOPY WITH EBUS      Current Outpatient Medications  Medication Sig Dispense Refill   aspirin  EC 81 MG tablet Take 81 mg by mouth every evening.      atorvastatin  (LIPITOR ) 20 MG tablet Take 20 mg by mouth every evening.     BRILINTA  90 MG TABS tablet Take 90 mg by mouth 2 (two) times daily.     carvedilol  (COREG ) 6.25 MG tablet TAKE 1 TABLET TWICE A DAY WITH MEALS (DOSE CHANGE, NEW PRESCRIPTION) 180 tablet 2   cetirizine (ZYRTEC) 10 MG tablet Take 10 mg by mouth daily.     clotrimazole -betamethasone  (LOTRISONE ) cream Apply 1 Application topically 2 (two) times daily. 30 g 3   Cobalamin Combinations (NEURIVA PLUS) CAPS Take 1 capsule by mouth daily.     dextromethorphan -guaiFENesin  (MUCINEX  DM) 30-600 MG 12hr tablet Take 1 tablet by mouth 2 (two) times daily.     gabapentin  (NEURONTIN ) 300 MG capsule Take 300 mg by mouth 2 (two) times daily.      HYDROcodone -acetaminophen  (NORCO) 10-325 MG tablet Take 1 tablet by mouth at bedtime.     HYDROcodone -acetaminophen  (NORCO/VICODIN) 5-325 MG tablet Take 1-2 tablets by mouth See admin instructions. Take 2 tablets in the morning and evening, may take 1 tablet at lunch as needed for pain     Magnesium  250 MG TABS Take 250 mg by mouth every evening.     Multiple Vitamins-Minerals (CENTRUM SILVER PO) Take 1 tablet by mouth daily. 50+     Naphazoline HCl (CLEAR EYES OP) Place 1 drop into both eyes daily as needed (dry eyes).     Omega-3 Fatty Acids (FISH OIL ) 1000 MG CAPS Take 1,000 mg by mouth every evening.      sacubitril -valsartan  (ENTRESTO ) 97-103 MG Take 1 tablet by mouth 2 (two) times daily. 60 tablet 11   Salicylic Acid (GOLD BOND PSORIASIS RELIEF) 3 % CREA Apply 1 application topically daily as needed (psoriasis).     silodosin (RAPAFLO) 8 MG CAPS capsule Take 8 mg by mouth daily with breakfast.     SUPER B COMPLEX/C PO Take 1 tablet by mouth every evening.     tamsulosin  (FLOMAX ) 0.4 MG CAPS capsule Take 0.4 mg by mouth daily after supper.     traMADol (ULTRAM) 50 MG tablet Take 50 mg by mouth 2 (two) times daily as needed for moderate pain (pain score 4-6).     No current facility-administered medications for this visit.    Physical Exam BP (!) 164/76 (BP Location: Left Arm, Patient Position: Sitting, Cuff Size: Normal)   Pulse 60   Resp 20   Ht 5' 3 (1.6 m)   Wt 146 lb 8 oz (66.5 kg)   SpO2 98% Comment: RA  BMI 25.13 kg/m  78 year old man in no acute distress Alert and oriented x 3 with no focal deficits No cervical or supraclavicular adenopathy Lungs clear bilaterally, no wheezing Cardiac regular rate and rhythm, normal S1 and S2 No clubbing, cyanosis, or edema   Diagnostic Tests: NUCLEAR MEDICINE PET SKULL BASE TO THIGH   TECHNIQUE: 7.22 mCi F-18 FDG was injected intravenously. Full-ring PET imaging was performed from the skull base to thigh after the radiotracer. CT data was obtained and used for attenuation correction and anatomic localization.   Fasting blood glucose: 93 mg/dl   COMPARISON:  Chest CT 04/11/2024   FINDINGS: Mediastinal blood pool activity: SUV max 2.1  NECK:   No hypermetabolic cervical lymph nodes are identified. No suspicious activity identified within the pharyngeal mucosal space.   Incidental CT findings: Severe bilateral carotid atherosclerosis.   CHEST:   There are no hypermetabolic mediastinal, hilar or axillary lymph nodes. There is marked hypermetabolic activity within the previously demonstrated spiculated right lower lobe  mass. This mass measures 3.4 x 2.9 cm on image 50/7 and has an SUV max of 16.2. Additional scattered pulmonary nodules are grossly stable, without additional hypermetabolic activity.   Incidental CT findings: Left subclavian pacemaker. Diffuse atherosclerosis of the aorta, great vessels and coronary arteries. Moderate centrilobular and paraseptal emphysema.   ABDOMEN/PELVIS:   There is no hypermetabolic activity within the liver, adrenal glands, spleen or pancreas. There is no hypermetabolic nodal activity in the abdomen or pelvis.   Incidental CT findings: Diffuse aortic and branch vessel atherosclerosis. Bilateral iliac and right femoral arterial stents.   SKELETON:   There is no hypermetabolic activity to suggest osseous metastatic disease.   Incidental CT findings: Right shoulder arthroplasty. Mild multilevel spondylosis.   IMPRESSION: 1. The previously demonstrated spiculated right lower lobe mass is hypermetabolic, consistent with primary bronchogenic carcinoma. 2. No evidence of metastatic disease. Additional scattered pulmonary nodules are grossly stable, without hypermetabolic activity. Although likely benign, these are suboptimally evaluated based on size. Attention on follow-up recommended. 3. Aortic Atherosclerosis (ICD10-I70.0) and Emphysema (ICD10-J43.9).     Electronically Signed   By: Elsie Perone M.D.   On: 07/02/2024 14:27   Pulmonary function testing 07/08/2024 FVC 3.57 (117%) FEV1 2.34 (109%) DLCO 8.94 (45%)   Impression: Dontez Hauss is a 77 year old man with a history of tobacco abuse, emphysema, MI, left bundle branch block, congestive heart failure, ICD, ethanol abuse and withdrawal, cirrhosis, PAD, hypertension, hyperlipidemia, thrombocytopenia, and newly diagnosed stage IB squamous cell carcinoma of the right lower lobe.   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  Discussed options for treatment  including surgical resection and radiation.  We discussed the relative advantages and disadvantages of each approach.  He does have multiple risk factors for surgery but does have adequate pulmonary reserve and exercise tolerance to tolerate a lobectomy.  I informed him of the general nature of the procedure including the need for general anesthesia, the incisions to be used, the use of the surgical robot, the possible need for conversion to open, the use of a drainage tube postoperatively, the expected hospital stay, and the overall recovery.  I informed him of the indications, risks, benefits, and alternatives.  He understands the risks include, but not limited to death, MI, DVT, PE, bleeding, possible need for transfusion, infection, prolonged air leak, cardiac arrhythmias, as well as possibility of other unforeseeable complications.  He accepts the risk and wishes to proceed with surgery.  Plan: Robotic assisted right lower lobectomy on Friday, 08/14/2024 Hold Brilinta  for 5 days prior to surgery He knows to also hold Entresto  and fish oil   [name], MD [facility] [phone]

## 2024-08-07 ENCOUNTER — Encounter: Payer: Self-pay | Admitting: *Deleted

## 2024-08-07 ENCOUNTER — Other Ambulatory Visit: Payer: Self-pay | Admitting: *Deleted

## 2024-08-07 DIAGNOSIS — R0602 Shortness of breath: Secondary | ICD-10-CM

## 2024-08-07 DIAGNOSIS — C3491 Malignant neoplasm of unspecified part of right bronchus or lung: Secondary | ICD-10-CM

## 2024-08-07 NOTE — Progress Notes (Signed)
 The proposed treatment discussed in conference is for discussion purpose only and is not a binding recommendation.  The patients have not been physically examined, or presented with their treatment options.  Therefore, final treatment plans cannot be decided.

## 2024-08-11 ENCOUNTER — Encounter: Payer: Self-pay | Admitting: Internal Medicine

## 2024-08-11 ENCOUNTER — Ambulatory Visit (INDEPENDENT_AMBULATORY_CARE_PROVIDER_SITE_OTHER): Payer: Medicare Other

## 2024-08-11 DIAGNOSIS — I739 Peripheral vascular disease, unspecified: Secondary | ICD-10-CM

## 2024-08-11 NOTE — Progress Notes (Signed)
 Surgical Instructions   Your procedure is scheduled on Wednesday, September 24th, 2025. Report to [facility] Main Entrance A at 5:30 A.M., then check in with the Admitting office. Any questions or running late day of surgery: call [phone]  Questions prior to your surgery date: call [phone], Monday-Friday, 8am-4pm. If you experience any cold or flu symptoms such as cough, fever, chills, shortness of breath, etc. between now and your scheduled surgery, please notify us  at the above number.     Remember:  Do not eat or drink after midnight the night before your surgery     Take these medicines the morning of surgery with A SIP OF WATER : Carvedilol  (Coreg )  Cetirizine (Zyrtec) Dextromethorphan -guaifenesin  (Mucinex  DM) Gabapentin  (Neurontin )   May take these medicines IF NEEDED: Hydrocodone -acetaminophen  (Norco) Naphazoline HCL (Clear eyes) Tramadol (Ultram)   Do NOT take Aspirin  on the morning of surgery.    Per Dr. Kerrin, do not take Omega-3 Fatty Acids (Fish Oil ), Sacubitril -valsartan  (Entresto ) or Brilinta  until after surgery.     One week prior to surgery, STOP taking any Aleve , Naproxen , Ibuprofen, Motrin, Advil, Goody's, BC's, all herbal medications, fish oil , and non-prescription vitamins.                     Do NOT Smoke (Tobacco/Vaping) for 24 hours prior to your procedure.  If you use a CPAP at night, you may bring your mask/headgear for your overnight stay.   You will be asked to remove any contacts, glasses, piercing's, hearing aid's, dentures/partials prior to surgery. Please bring cases for these items if needed.    Patients discharged the day of surgery will not be allowed to drive home, and someone needs to stay with them for 24 hours.  SURGICAL WAITING ROOM VISITATION Patients may have no more than 2 support people in the waiting area - these visitors may rotate.   Pre-op nurse will coordinate an appropriate time for 1 ADULT support  person, who may not rotate, to accompany patient in pre-op.  Children under the age of 20 must have an adult with them who is not the patient and must remain in the main waiting area with an adult.  If the patient needs to stay at the hospital during part of their recovery, the visitor guidelines for inpatient rooms apply.  Please refer to the [facility] website for the visitor guidelines for any additional information.   If you received a COVID test during your pre-op visit  it is requested that you wear a mask when out in public, stay away from anyone that may not be feeling well and notify your surgeon if you develop symptoms. If you have been in contact with anyone that has tested positive in the last 10 days please notify you surgeon.      Pre-operative CHG Bathing Instructions   You can play a key role in reducing the risk of infection after surgery. Your skin needs to be as free of germs as possible. You can reduce the number of germs on your skin by washing with CHG (chlorhexidine  gluconate) soap before surgery. CHG is an antiseptic soap that kills germs and continues to kill germs even after washing.   DO NOT use if you have an allergy to chlorhexidine /CHG or antibacterial soaps. If your skin becomes reddened or irritated, stop using the CHG and notify one of our RNs at [phone].              TAKE A SHOWER THE  NIGHT BEFORE SURGERY AND THE DAY OF SURGERY    Please keep in mind the following:  DO NOT shave, including legs and underarms, 48 hours prior to surgery.   You may shave your face before/day of surgery.  Place clean sheets on your bed the night before surgery Use a clean washcloth (not used since being washed) for each shower. DO NOT sleep with pet's night before surgery.  CHG Shower Instructions:  Wash your face and private area with normal soap. If you choose to wash your hair, wash first with your normal shampoo.  After you use shampoo/soap, rinse your hair and  body thoroughly to remove shampoo/soap residue.  Turn the water  OFF and apply half the bottle of CHG soap to a CLEAN washcloth.  Apply CHG soap ONLY FROM YOUR NECK DOWN TO YOUR TOES (washing for 3-5 minutes)  DO NOT use CHG soap on face, private areas, open wounds, or sores.  Pay special attention to the area where your surgery is being performed.  If you are having back surgery, having someone wash your back for you may be helpful. Wait 2 minutes after CHG soap is applied, then you may rinse off the CHG soap.  Pat dry with a clean towel  Put on clean pajamas    Additional instructions for the day of surgery: DO NOT APPLY any lotions, deodorants, cologne, or perfumes.   Do not wear jewelry or makeup Do not wear nail polish, gel polish, artificial nails, or any other type of covering on natural nails (fingers and toes) Do not bring valuables to the hospital. [facility] is not responsible for valuables/personal belongings. Put on clean/comfortable clothes.  Please brush your teeth.  Ask your nurse before applying any prescription medications to the skin.

## 2024-08-11 NOTE — Progress Notes (Signed)
 Cardiac device orders have been requested.

## 2024-08-11 NOTE — Progress Notes (Signed)
 PERIOPERATIVE PRESCRIPTION FOR IMPLANTED CARDIAC DEVICE PROGRAMMING  Patient Information: Name:  Roy [name]  DOB:  12-18-1945  MRN:  [id number]   Planned Procedure: Right Robotic assisted thoracoscopy for right lower lobectomy  Surgeon: Dr. Elspeth Millers  Date of Procedure: 08-19-24  Cautery will be used.  Position during surgery: Supine   Device Information:  Clinic EP Physician:  Danelle Birmingham, MD   Device Type:  Defibrillator Manufacturer and Phone #:  [employer]: [phone] Pacemaker Dependent?:  No. Date of Last Device Check: 08/11/2024   Normal Device Function?:  Yes.    Electrophysiologist's Recommendations:  Have magnet available. Provide continuous ECG monitoring when magnet is used or reprogramming is to be performed.  Procedure will likely interfere with device function.  Device should be programmed:  Asynchronous pacing during procedure and returned to normal programming after procedure  Per Device Clinic Standing Orders, [name], RN  12:32 PM 08/11/2024

## 2024-08-11 NOTE — Progress Notes (Signed)
 Surgical Instructions   Your procedure is scheduled on Wednesday, September 24th. Report to [facility] Main Entrance A at 5:30 A.M., then check in with the Admitting office. Any questions or running late day of surgery: call [phone]  Questions prior to your surgery date: call [phone], Monday-Friday, 8am-4pm. If you experience any cold or flu symptoms such as cough, fever, chills, shortness of breath, etc. between now and your scheduled surgery, please notify us  at the above number.     Remember:  Do not eat or drink after midnight the night before your surgery    Take these medicines the morning of surgery with A SIP OF WATER   carvedilol  (COREG )  cetirizine (ZYRTEC)  dextromethorphan -guaiFENesin  (MUCINEX  DM)  gabapentin  (NEURONTIN )    May take these medicines IF NEEDED: HYDROcodone -acetaminophen  (NORCO/VICODIN)  Naphazoline HCl (Clear Eyes) drops  traMADol (ULTRAM)   HOLD your aspirin  on day of surgery.  Last dose should be on Tuesday, 08-18-24  Per your physician's instructions, HOLD your sacubitril -valsartan  (ENTRESTO ) for 3 days prior to surgery.  Last dose Saturday, 08-15-24.   Per your physician's instructions, HOLD your BRILINTA  for 5 days prior to surgery. Last dose: Thursday, 08-13-24.  Per your physician's instructions, HOLD your Omega-3 Fatty Acids (FISH OIL ) for 5 days prior to surgery.  Last dose: Thursday, 08-13-24.   One week prior to surgery, STOP taking any Aleve , Naproxen , Ibuprofen, Motrin, Advil, Goody's, BC's, all herbal medications, and non-prescription vitamins.                      Do NOT Smoke (Tobacco/Vaping) for 24 hours prior to your procedure.  If you use a CPAP at night, you may bring your mask/headgear for your overnight stay.   You will be asked to remove any contacts, glasses, piercing's, hearing aid's, dentures/partials prior to surgery. Please bring cases for these items if needed.    Patients discharged the day of surgery will  not be allowed to drive home, and someone needs to stay with them for 24 hours.  SURGICAL WAITING ROOM VISITATION Patients may have no more than 2 support people in the waiting area - these visitors may rotate.   Pre-op nurse will coordinate an appropriate time for 1 ADULT support person, who may not rotate, to accompany patient in pre-op.  Children under the age of 78 must have an adult with them who is not the patient and must remain in the main waiting area with an adult.  If the patient needs to stay at the hospital during part of their recovery, the visitor guidelines for inpatient rooms apply.  Please refer to the [facility] website for the visitor guidelines for any additional information.   If you received a COVID test during your pre-op visit  it is requested that you wear a mask when out in public, stay away from anyone that may not be feeling well and notify your surgeon if you develop symptoms. If you have been in contact with anyone that has tested positive in the last 10 days please notify you surgeon.      Pre-operative CHG Bathing Instructions   You can play a key role in reducing the risk of infection after surgery. Your skin needs to be as free of germs as possible. You can reduce the number of germs on your skin by washing with CHG (chlorhexidine  gluconate) soap before surgery. CHG is an antiseptic soap that kills germs and continues to kill germs even after washing.  DO NOT use if you have an allergy to chlorhexidine /CHG or antibacterial soaps. If your skin becomes reddened or irritated, stop using the CHG and notify one of our RNs at [phone].              TAKE A SHOWER THE NIGHT BEFORE SURGERY AND THE DAY OF SURGERY    Please keep in mind the following:  DO NOT shave, including legs and underarms, 48 hours prior to surgery.   You may shave your face before/day of surgery.  Place clean sheets on your bed the night before surgery Use a clean washcloth (not used  since being washed) for each shower. DO NOT sleep with pet's night before surgery.  CHG Shower Instructions:  Wash your face and private area with normal soap. If you choose to wash your hair, wash first with your normal shampoo.  After you use shampoo/soap, rinse your hair and body thoroughly to remove shampoo/soap residue.  Turn the water  OFF and apply half the bottle of CHG soap to a CLEAN washcloth.  Apply CHG soap ONLY FROM YOUR NECK DOWN TO YOUR TOES (washing for 3-5 minutes)  DO NOT use CHG soap on face, private areas, open wounds, or sores.  Pay special attention to the area where your surgery is being performed.  If you are having back surgery, having someone wash your back for you may be helpful. Wait 2 minutes after CHG soap is applied, then you may rinse off the CHG soap.  Pat dry with a clean towel  Put on clean pajamas    Additional instructions for the day of surgery: DO NOT APPLY any lotions, deodorants, cologne, or perfumes.   Do not wear jewelry or makeup Do not wear nail polish, gel polish, artificial nails, or any other type of covering on natural nails (fingers and toes) Do not bring valuables to the hospital. [facility] is not responsible for valuables/personal belongings. Put on clean/comfortable clothes.  Please brush your teeth.  Ask your nurse before applying any prescription medications to the skin.

## 2024-08-12 ENCOUNTER — Encounter (HOSPITAL_COMMUNITY): Payer: Self-pay

## 2024-08-12 ENCOUNTER — Encounter (HOSPITAL_COMMUNITY)
Admission: RE | Admit: 2024-08-12 | Discharge: 2024-08-12 | Disposition: A | Discharge status: Home / Self Care | Source: Ambulatory Visit | Attending: Thoracic Surgery (Cardiothoracic Vascular Surgery) | Admitting: Thoracic Surgery (Cardiothoracic Vascular Surgery)

## 2024-08-12 ENCOUNTER — Other Ambulatory Visit: Payer: Self-pay

## 2024-08-12 VITALS — BP 149/63 | HR 60 | Temp 97.9°F | Resp 19 | Ht 63.0 in | Wt 147.9 lb

## 2024-08-12 DIAGNOSIS — J439 Emphysema, unspecified: Secondary | ICD-10-CM | POA: Diagnosis not present

## 2024-08-12 DIAGNOSIS — Z01818 Encounter for other preprocedural examination: Secondary | ICD-10-CM | POA: Insufficient documentation

## 2024-08-12 DIAGNOSIS — R0602 Shortness of breath: Secondary | ICD-10-CM

## 2024-08-12 DIAGNOSIS — I252 Old myocardial infarction: Secondary | ICD-10-CM | POA: Insufficient documentation

## 2024-08-12 DIAGNOSIS — Z95820 Peripheral vascular angioplasty status with implants and grafts: Secondary | ICD-10-CM | POA: Insufficient documentation

## 2024-08-12 DIAGNOSIS — N471 Phimosis: Secondary | ICD-10-CM | POA: Diagnosis not present

## 2024-08-12 DIAGNOSIS — Z8673 Personal history of transient ischemic attack (TIA), and cerebral infarction without residual deficits: Secondary | ICD-10-CM | POA: Insufficient documentation

## 2024-08-12 DIAGNOSIS — E785 Hyperlipidemia, unspecified: Secondary | ICD-10-CM | POA: Insufficient documentation

## 2024-08-12 DIAGNOSIS — I739 Peripheral vascular disease, unspecified: Secondary | ICD-10-CM | POA: Insufficient documentation

## 2024-08-12 DIAGNOSIS — Z7902 Long term (current) use of antithrombotics/antiplatelets: Secondary | ICD-10-CM | POA: Insufficient documentation

## 2024-08-12 DIAGNOSIS — Z720 Tobacco use: Secondary | ICD-10-CM | POA: Diagnosis not present

## 2024-08-12 DIAGNOSIS — Z7982 Long term (current) use of aspirin: Secondary | ICD-10-CM | POA: Diagnosis not present

## 2024-08-12 DIAGNOSIS — C3491 Malignant neoplasm of unspecified part of right bronchus or lung: Secondary | ICD-10-CM

## 2024-08-12 DIAGNOSIS — I2510 Atherosclerotic heart disease of native coronary artery without angina pectoris: Secondary | ICD-10-CM | POA: Insufficient documentation

## 2024-08-12 DIAGNOSIS — I5022 Chronic systolic (congestive) heart failure: Secondary | ICD-10-CM | POA: Diagnosis not present

## 2024-08-12 DIAGNOSIS — Z95810 Presence of automatic (implantable) cardiac defibrillator: Secondary | ICD-10-CM | POA: Insufficient documentation

## 2024-08-12 DIAGNOSIS — Z79899 Other long term (current) drug therapy: Secondary | ICD-10-CM | POA: Insufficient documentation

## 2024-08-12 DIAGNOSIS — I110 Hypertensive heart disease with heart failure: Secondary | ICD-10-CM | POA: Insufficient documentation

## 2024-08-12 DIAGNOSIS — C3431 Malignant neoplasm of lower lobe, right bronchus or lung: Secondary | ICD-10-CM | POA: Diagnosis not present

## 2024-08-12 LAB — CUP PACEART REMOTE DEVICE CHECK
Battery Remaining Longevity: 74 mo
Battery Remaining Percentage: 87 %
Battery Voltage: 2.99 V
Brady Statistic AP VP Percent: 43 %
Brady Statistic AP VS Percent: 1 %
Brady Statistic AS VP Percent: 56 %
Brady Statistic AS VS Percent: 1 %
Brady Statistic RA Percent Paced: 42 %
Date Time Interrogation Session: 20250916113144
HighPow Impedance: 71 Ohm
Implantable Lead Connection Status: 753985
Implantable Lead Connection Status: 753985
Implantable Lead Connection Status: 753985
Implantable Lead Implant Date: 20160923
Implantable Lead Implant Date: 20160923
Implantable Lead Implant Date: 20160923
Implantable Lead Location: 753858
Implantable Lead Location: 753859
Implantable Lead Location: 753860
Implantable Lead Model: 7122
Implantable Pulse Generator Implant Date: 20241216
Lead Channel Impedance Value: 440 Ohm
Lead Channel Impedance Value: 450 Ohm
Lead Channel Impedance Value: 740 Ohm
Lead Channel Pacing Threshold Amplitude: 0.75 V
Lead Channel Pacing Threshold Amplitude: 1 V
Lead Channel Pacing Threshold Amplitude: 1.5 V
Lead Channel Pacing Threshold Pulse Width: 0.5 ms
Lead Channel Pacing Threshold Pulse Width: 0.5 ms
Lead Channel Pacing Threshold Pulse Width: 0.8 ms
Lead Channel Sensing Intrinsic Amplitude: 12 mV
Lead Channel Sensing Intrinsic Amplitude: 3.3 mV
Lead Channel Setting Pacing Amplitude: 2 V
Lead Channel Setting Pacing Amplitude: 2.5 V
Lead Channel Setting Pacing Amplitude: 2.5 V
Lead Channel Setting Pacing Pulse Width: 0.5 ms
Lead Channel Setting Pacing Pulse Width: 0.8 ms
Lead Channel Setting Sensing Sensitivity: 0.6 mV
Pulse Gen Serial Number: 111071805
Zone Setting Status: 755011

## 2024-08-12 LAB — COMPREHENSIVE METABOLIC PANEL WITH GFR
ALT: 15 U/L (ref 0–44)
AST: 20 U/L (ref 15–41)
Albumin: 3.3 g/dL — ABNORMAL LOW (ref 3.5–5.0)
Alkaline Phosphatase: 103 U/L (ref 38–126)
Anion gap: 12 (ref 5–15)
BUN: 13 mg/dL (ref 8–23)
CO2: 24 mmol/L (ref 22–32)
Calcium: 8.9 mg/dL (ref 8.9–10.3)
Chloride: 102 mmol/L (ref 98–111)
Creatinine, Ser: 1.07 mg/dL (ref 0.61–1.24)
GFR, Estimated: >60 mL/min (ref >60)
Glucose, Bld: 81 mg/dL (ref 70–99)
Potassium: 4.3 mmol/L (ref 3.5–5.1)
Sodium: 138 mmol/L (ref 135–145)
Total Bilirubin: 0.5 mg/dL (ref 0.0–1.2)
Total Protein: 6.8 g/dL (ref 6.5–8.1)

## 2024-08-12 LAB — URINALYSIS, ROUTINE W REFLEX MICROSCOPIC
Bilirubin Urine: NEGATIVE
Glucose, UA: NEGATIVE mg/dL
Hgb urine dipstick: NEGATIVE
Ketones, ur: NEGATIVE mg/dL
Leukocytes,Ua: NEGATIVE
Nitrite: NEGATIVE
Protein, ur: NEGATIVE mg/dL
Specific Gravity, Urine: 1.009 (ref 1.005–1.030)
pH: 6 (ref 5.0–8.0)

## 2024-08-12 LAB — CBC
HCT: 38.7 % — ABNORMAL LOW (ref 39.0–52.0)
Hemoglobin: 12.6 g/dL — ABNORMAL LOW (ref 13.0–17.0)
MCH: 30.3 pg (ref 26.0–34.0)
MCHC: 32.6 g/dL (ref 30.0–36.0)
MCV: 93 fL (ref 80.0–100.0)
Platelets: 292 K/uL (ref 150–400)
RBC: 4.16 MIL/uL — ABNORMAL LOW (ref 4.22–5.81)
RDW: 12.5 % (ref 11.5–15.5)
WBC: 8.2 K/uL (ref 4.0–10.5)
nRBC: 0 % (ref 0.0–0.2)

## 2024-08-12 LAB — APTT: aPTT: 33 s (ref 24–36)

## 2024-08-12 LAB — SURGICAL PCR SCREEN
MRSA, PCR: NEGATIVE
Staphylococcus aureus: NEGATIVE

## 2024-08-12 LAB — PROTIME-INR
INR: 1 (ref 0.8–1.2)
Prothrombin Time: 13.8 s (ref 11.4–15.2)

## 2024-08-12 NOTE — Progress Notes (Signed)
 PCP - Dr. Norleen Hurst Cardiologist - Dr. Dorn Ross, LOV 06/25/2024 EP Cardiologist: Dr. Danelle Birmingham  PPM/ICD - ICD, [employer] Device Orders - Received and on the chart Rep Notified - Yes, by Bernardino Sprang, RN  Chest x-ray - DOS, 08/19/2024 EKG - PAT, 08/12/2024 Stress Test - 08/30/2014 ECHO - 07/21/2024 Cardiac Cath -   Sleep Study - denies CPAP - na  Non-diabetic  Blood Thinner Instructions:Brilinta , stopped by Bernardino Sprang, RN with last dose 08/08/2024 Aspirin  Instructions:continue, do not take DOS  Entresto  and fish oil  stopped by Bernardino Sprang, RN with last dose 08/08/2024  ERAS Protcol - NPO   Anesthesia review: Yes. LBBB, HTN, CHF, AICD, MI, PVD, COPD  Patient denies shortness of breath, fever, cough and chest pain at PAT appointment   All instructions explained to the patient, with a verbal understanding of the material. Patient agrees to go over the instructions while at home for a better understanding. Patient also instructed to self quarantine after being tested for COVID-19. The opportunity to ask questions was provided.

## 2024-08-13 NOTE — Progress Notes (Signed)
 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  He was found to have a 3.1 cm spiculated right lower lobe mass on lung cancer screening chest CT in May. S/p video bronchoscopy 07/30/2024 with RLL FNA + squamous cell carcinoma.    History includes smoking, COPD, lung cancer (RLL 07/30/2024), CAD (NSTEMI, EF 20% 06/02/14, LHC not pursued due to AKI and mental status changes; NST 08/30/14: large inferior infarct, no ischemia, medical management; EF 50-55% 08/01/17), chronic systolic CHF, AICD (St. 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    Patient last seen by cardiology on 06/25/2024 by Miriam Norris, NP and with EP Dr. Waddell on 04/07/2024. He was overall doing well from cardiac standpoint. He was  motivated to quit smoking following suspected lung cancer diagnosis. No anginal symptoms. Euvolemic on exam with normal ICD function at last check. Medical therapy continued. Carotid US  ordered for right carotid bruit and showed 1-39% BICA stenosis 07/21/2024. TTE updated to evaluate aortic dilatation and on 07/21/2024 showed LVEF 60-65%, no RWMA, grade 1 DD, normal RV systolic function, normal PASP, mild MR, visualized aorta was not noted to be dilated.  Dr. Kerrin classified patient's Zubrod Score as 1:  Restricted in physical strenuous activity but ambulatory, able to do out light work. He was able to walk 1364 feet on 6-minute walk test. PFTs were felt to be adequate to tolerate lobectomy.  Dr. Kerrin discussed options of surgery versus radiation. Patient has opted to proceed with surgery.   Per 08/06/2024 office note by Dr. Kerrin, Discussed with Dr. Alvan, he does not feel that any additional cardiac workup would be necessary prior to surgery.  EP ICD preoperative recommendations: Device Information: Clinic EP Physician:  Danelle Waddell, MD  Device Type:  Defibrillator Manufacturer and Phone #:  St. Jude/Abbott: [phone] Pacemaker Dependent?:  No. Date of Last Device Check: 08/11/2024       Normal Device Function?:  Yes.     Electrophysiologist's Recommendations:  Have magnet available. Provide continuous ECG monitoring when magnet is used or reprogramming is to be performed.  Procedure will likely interfere with device function.  Device should be programmed:  Asynchronous pacing during procedure and returned to normal programming after procedure.   Per surgeon preoperative medication instructions, hold ASA after 08/18/2024 dose, Entresto  after 08/15/2024 dose, and Brilinta  & Omega 3/fish oil  after 08/13/2024 doses.   Anesthesia team to evaluate on the day of  surgery.   VS: BP (!) 149/63   Pulse 60   Temp 36.6 C   Resp 19   Ht 5' 3 (1.6 m)   Wt 67.1 kg   SpO2  98%   BMI 26.20 kg/m    PROVIDERS: Shona Norleen PEDLAR, MD is PCP  - Alvan Carrier, MD is primary cardiologist - Waddell Lusher, MD is EP cardiologist - Serene, V. Malvina, MD is vascular surgeon - Sherrilee Dover, MD is urologist. Evaluated on 07/20/2024 for phimosis. Clotrimazole  x 4 week planned and if fails to improve balanitis will plan circumcision.     LABS: Labs reviewed: Acceptable for surgery. (all labs ordered are listed, but only abnormal results are displayed)  Labs Reviewed  CBC - Abnormal; Notable for the following components:      Result Value   RBC 4.16 (*)    Hemoglobin 12.6 (*)    HCT 38.7 (*)    All other components within normal limits  COMPREHENSIVE METABOLIC PANEL WITH GFR - Abnormal; Notable for the following components:   Albumin  3.3 (*)    All other components within normal limits  SURGICAL PCR SCREEN  PROTIME-INR  APTT  URINALYSIS, ROUTINE W REFLEX MICROSCOPIC  TYPE AND SCREEN    OTHER: PFTs 07/08/2024: FVC 3.57 (117%), post 3.61 (118%). FEV1 2.34 (109%), post 2.34 (109%). DLCO unc 8.94 (45%).   Colonoscopy 07/01/2024 Melford, Lamar, MD): 2 sigmoid polyps resected. Sigmoid diverticulosis noted.    IMAGES: 1V PCXR 07/30/2024: IMPRESSION: 1. Prominent reticular and coarse pulmonary opacities within the mid and lower lung zones bilaterally, worse on the right.   NM PET Super D CT Chest 07/02/2024: IMPRESSION: 1. Imaging for bronchoscopy planning and guidance. 2. The dominant spiculated right lower lobe mass is hypermetabolic on PET-CT today and consistent with bronchogenic carcinoma. 3. Multiple additional smaller bilateral pulmonary nodules are unchanged from the recent chest CT and not hypermetabolic on PET-CT. Recommend attention on follow-up. 4. No evidence of thoracic adenopathy or pleural effusion. 5. Aortic Atherosclerosis (ICD10-I70.0) and Emphysema (ICD10-J43.9).     EKG: 08/12/2024: AV dual-paced rhythm When compared with ECG of  07-Apr-2024 09:43, No significant change since last tracing Confirmed by Lavona Agent ([id number]) on 08/12/2024 4:36:16 PM     CV: Echo 07/21/2024: IMPRESSIONS   1. Left ventricular ejection fraction, by estimation, is 60 to 65%. Left  ventricular ejection fraction by 3D volume is 54 %. The left ventricle has  normal function. The left ventricle has no regional wall motion  abnormalities. Left ventricular diastolic   parameters are consistent with Grade I diastolic dysfunction (impaired  relaxation). The average left ventricular global longitudinal strain is  -18.8 %. The global longitudinal strain is normal.   2. Right ventricular systolic function is normal. The right ventricular  size is normal. There is normal pulmonary artery systolic pressure.   3. The mitral valve is normal in structure. Mild mitral valve  regurgitation. No evidence of mitral stenosis.   4. The aortic valve was not well visualized. Aortic valve regurgitation  is not visualized. No aortic stenosis is present.   5. The inferior vena cava is normal in size with greater than 50%  respiratory variability, suggesting right atrial pressure of 3 mmHg.   Comparison(s): A prior study was performed on 04/01/2023. EF 55-60%. Trivial  MR.  (Comparison: EF 50-55% 08/01/17; EF 35% 07/14/15, LVEF of 20% in setting of NSTEMI 05/2014)      US  Carotid 07/21/2024: Summary:  - Right Carotid: Velocities in the right  ICA are consistent with a 1-39% stenosis. Non-hemodynamically significant plaque <50% noted in the CCA. The ECA appears <50% stenosed.  - Left Carotid: Velocities in the left ICA are consistent with a 1-39% stenosis. Non-hemodynamically significant plaque <50% noted in the CCA. The ECA appears <50% stenosed.  - Vertebrals:  Right vertebral artery demonstrates antegrade flow. Left vertebral artery demonstrates no discernable flow.  - Subclavians: Normal flow hemodynamics were seen in bilateral subclavian arteries.      EP  Procedure 11/11/2023: Conclusion: successful ICD gen change out with the finding of an occluded innominate and subclavian vein preventing insertion of a new ICD in a patient with chronic systolic heart failure s/p BIV ICD who had reached ERI with noise on the ICD lead.      Nuclear stress test 08/30/14 (according to 11/02/14 note by Dr. Charls) demonstrated a large area of inferior myocardial scar with no evidence of ischemia, and inferior akinesis.    Past Medical History:  Diagnosis Date   AICD (automatic cardioverter/defibrillator) present 08/19/2015   St Jude BiV ICD for primary prevention by Dr. Waddell   Alcohol abuse    6 beers per day; hospital admission in 2009 for withdrawal symptoms   Anxiety and depression    denies    Cerebrovascular disease 2009   TIA; 2009- right ICA stent; re-intervention for restenosis complicated by [facility] w/o sx   CHF (congestive heart failure) (HCC) 06/02/2014   Chronic obstructive pulmonary disease (HCC)    Degenerative joint disease    Total shoulder arthroplasty-right   Hyperlipidemia    Hypertension    LBBB (left bundle branch block)    Normal echo-2011; stress nuclear in 09/2010--septal hypoperfusion representing nontransmural infarction or the effect of left bundle branch block, no ischemia   Lung cancer (HCC)    Myocardial infarction (HCC) 06/02/2014   Massive Heart Attack   Peripheral vascular disease (HCC)    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   Thrombocytopenia (HCC)    Tobacco abuse    -100 pack years; 1.5 packs per day   Traumatic seroma of thigh (HCC)    left   Tubular adenoma of colon     Past Surgical History:  Procedure Laterality Date   ABDOMINAL AORTAGRAM [name] 05/04/2014   Procedure: ABDOMINAL EZELLA;  Surgeon: Gaile LELON New, MD;  [facility];  Service: Cardiovascular;  Laterality: [name];   ABDOMINAL AORTOGRAM [name] 06/25/2017   Procedure: Abdominal Aortogram;  Surgeon: New Gaile LELON, MD;   [facility];  Service: Cardiovascular;  Laterality: [name];   ABDOMINAL AORTOGRAM W/LOWER EXTREMITY [name] 04/22/2018   Procedure: ABDOMINAL AORTOGRAM W/LOWER EXTREMITY;  Surgeon: New Gaile LELON, MD;  [facility];  Service: Cardiovascular;  Laterality: [name];   ABDOMINAL AORTOGRAM W/LOWER EXTREMITY [name] 03/08/2020   Procedure: ABDOMINAL AORTOGRAM W/LOWER EXTREMITY;  Surgeon: New Gaile LELON, MD;  [facility];  Service: Cardiovascular;  Laterality: [name];   ABDOMINAL AORTOGRAM W/LOWER EXTREMITY Bilateral 08/23/2020   Procedure: ABDOMINAL AORTOGRAM W/LOWER EXTREMITY;  Surgeon: New Gaile LELON, MD;  [facility];  Service: Cardiovascular;  Laterality: Bilateral;   ABDOMINAL AORTOGRAM W/LOWER EXTREMITY [name] 10/31/2021   Procedure: ABDOMINAL AORTOGRAM W/LOWER EXTREMITY;  Surgeon: New Gaile LELON, MD;  [facility];  Service: Cardiovascular;  Laterality: [name];   ABDOMINAL AORTOGRAM W/LOWER EXTREMITY [name] 12/19/2021   Procedure: ABDOMINAL AORTOGRAM W/LOWER EXTREMITY;  Surgeon: New Gaile LELON, MD;  [facility];  Service:  Cardiovascular;  Laterality: [name];   ABDOMINAL AORTOGRAM W/LOWER EXTREMITY Bilateral 02/19/2023   Procedure: ABDOMINAL AORTOGRAM W/LOWER EXTREMITY;  Surgeon: Serene Gaile ORN, MD;  [facility];  Service: Cardiovascular;  Laterality: Bilateral;   AGILE CAPSULE [name] 03/18/2014   Procedure: AGILE CAPSULE;  Surgeon: Margo LITTIE Haddock, MD;  [facility];  Service: Endoscopy;  Laterality: [name];  7:30   APPLICATION OF WOUND VAC Left 09/03/2017   Procedure: APPLICATION OF WOUND VAC;  Surgeon: Laurence Redell LITTIE, MD;  Location: [facility] OR;  Service: Vascular;  Laterality: Left;   APPLICATION OF WOUND VAC Right 04/07/2020   Procedure: PLACEMENT OF ANTIBIOTIC BEADS AND APPLICATION OF WOUND VAC;  Surgeon: Serene Gaile ORN, MD;  [facility];  Service: Vascular;  Laterality: Right;   BACK SURGERY      BACTERIAL OVERGROWTH TEST [name] 05/24/2015   Procedure: BACTERIAL OVERGROWTH TEST;  Surgeon: Margo LITTIE Haddock, MD;  [facility];  Service: Endoscopy;  Laterality: [name];  0700   BI-VENTRICULAR IMPLANTABLE CARDIOVERTER DEFIBRILLATOR  (CRT-D)  08/19/2015   BIV ICD GENERATOR CHANGEOUT [name] 11/11/2023   Procedure: BIV ICD GENERATOR CHANGEOUT;  Surgeon: Waddell Danelle ORN, MD;  Location: [facility] INVASIVE CV LAB;  Service: Cardiovascular;  Laterality: [name];   BRONCHIAL BIOPSY  07/30/2024   Procedure: BRONCHOSCOPY, WITH BIOPSY;  Surgeon: Kerrin Elspeth BROCKS, MD;  Location: [facility] ENDOSCOPY;  Service: Thoracic;;   BRONCHIAL NEEDLE ASPIRATION BIOPSY  07/30/2024   Procedure: BRONCHOSCOPY, WITH NEEDLE ASPIRATION BIOPSY;  Surgeon: Kerrin Elspeth BROCKS, MD;  Location: [facility] ENDOSCOPY;  Service: Thoracic;;   CARPAL TUNNEL RELEASE Left 02/02/2016   Procedure: LEFT CARPAL TUNNEL RELEASE;  Surgeon: Arley Curia, MD;  Location: [facility];  Service: Orthopedics;  Laterality: Left;  ANESTHESIA: IV REGIONAL UPPER ARM   CATARACT EXTRACTION W/PHACO Right 03/08/2015   Procedure: CATARACT EXTRACTION PHACO AND INTRAOCULAR LENS PLACEMENT (IOC);  Surgeon: Oneil Platts, MD;  Location: [facility];  Service: Ophthalmology;  Laterality: Right;  CDE:9.46   CATARACT EXTRACTION W/PHACO Left 03/22/2015   Procedure: CATARACT EXTRACTION PHACO AND INTRAOCULAR LENS PLACEMENT (IOC);  Surgeon: Oneil Platts, MD;  Location: [facility];  Service: Ophthalmology;  Laterality: Left;  CDE:5.80   COLONOSCOPY  08/22/09   Fields-(Tubular Adenoma)3-mm transverse polyp/4-mm polyp otherwise noraml/small internal hemorrhoids   COLONOSCOPY [name] 04/14/2014   DOQ:dfjoo internal hemorrhids/normal mocsa in the terminal iluem/left colonis redundant   COLONOSCOPY [name] 07/01/2024   Procedure: COLONOSCOPY;  Surgeon: Shaaron Lamar HERO, MD;  [facility];  Service: Endoscopy;  Laterality: [name];  900am, asa 3   COLONOSCOPY W/ POLYPECTOMY  2011   ENDARTERECTOMY FEMORAL  Left 08/16/2017   Procedure: ENDARTERECTOMY LEFT PROFUNDA FEMORAL;  Surgeon: Serene Gaile ORN, MD;  Location: [facility] OR;  Service: Vascular;  Laterality: Left;   ENDARTERECTOMY FEMORAL Right 03/31/2020   Procedure: RIGHT FEMORAL ENDARTERECTOMY;  Surgeon: Serene Gaile ORN, MD;  [facility];  Service: Vascular;  Laterality: Right;   EP IMPLANTABLE DEVICE [name] 08/19/2015   Procedure: BiV ICD Insertion CRT-D;  Surgeon: Danelle ORN Waddell, MD;  Location: [facility] INVASIVE CV LAB;  Service: Cardiovascular;  Laterality: [name];   ESOPHAGOGASTRODUODENOSCOPY [name] 03/05/2014   SLF: 1. Stricture at the gastroesophagael junction 2. Small hiatal hernia 3. Moderate non-erosive gastritis and duodentitis. 4. No surce for Melena identified.    FEMORAL-POPLITEAL BYPASS GRAFT Left 08/16/2017   Procedure: LEFT  FEMORAL-POPLITEAL ARTERY  BYPASS GRAFT;  Surgeon: Serene Gaile ORN, MD;  [facility];  Service: Vascular;  Laterality:  Left;   GIVENS CAPSULE STUDY [name] 03/30/2014   Procedure: GIVENS CAPSULE STUDY;  Surgeon: Margo LITTIE Haddock, MD;  [facility];  Service: Endoscopy;  Laterality: [name];  7:30   GROIN DEBRIDEMENT Right 04/07/2020   Procedure: INCISION AND DRAINAGE OF RIGHT GROIN;  Surgeon: Serene Gaile ORN, MD;  [facility];  Service: Vascular;  Laterality: Right;   I & D EXTREMITY Left 09/03/2017   Procedure: IRRIGATION AND DEBRIDEMENT EXTREMITY LEFT THIGH SEROMA;  Surgeon: Laurence Redell LITTIE, MD;  [facility];  Service: Vascular;  Laterality: Left;   INSERT / REPLACE / REMOVE PACEMAKER     INSERTION OF ILIAC STENT  03/31/2020   Procedure: INSERTION OF RIGHT ILIAC ARTERY STENT AND RIGHT SUPERFICIAL FEMORAL ARTERY STENT;  Surgeon: Serene Gaile ORN, MD;  [facility];  Service: Vascular;;   IR ANGIO INTRA EXTRACRAN SEL COM CAROTID INNOMINATE BILAT MOD SED  07/16/2017   IR ANGIO INTRA EXTRACRAN SEL COM CAROTID INNOMINATE BILAT MOD SED  06/12/2019   IR ANGIO INTRA EXTRACRAN SEL COM CAROTID INNOMINATE BILAT MOD SED  01/17/2021    IR ANGIO VERTEBRAL SEL SUBCLAVIAN INNOMINATE BILAT MOD SED  07/16/2017   IR ANGIO VERTEBRAL SEL SUBCLAVIAN INNOMINATE BILAT MOD SED  06/12/2019   IR ANGIO VERTEBRAL SEL SUBCLAVIAN INNOMINATE BILAT MOD SED  01/17/2021   IR RADIOLOGIST EVAL & MGMT  04/01/2017   IR RADIOLOGIST EVAL & MGMT  08/02/2017   IR TRANSCATH EXCRAN VERT OR CAR A STENT  07/22/2019   IR US  GUIDE VASC ACCESS RIGHT  06/12/2019   IR US  GUIDE VASC ACCESS RIGHT  01/17/2021   JOINT REPLACEMENT Right    Total Shoulder Replacement    LOWER EXTREMITY ANGIOGRAPHY Bilateral 06/25/2017   Procedure: Lower Extremity Angiography;  Surgeon: Serene Gaile ORN, MD;  [facility];  Service: Cardiovascular;  Laterality: Bilateral;   LUMBAR FUSION  2010   PATCH ANGIOPLASTY Right 03/31/2020   Procedure: PATCH ANGIOPLASTY USING XENOSURE BIOLOGIC PATCH 1cm x 14cm;  Surgeon: Serene Gaile ORN, MD;  Location: [facility] OR;  Service: Vascular;  Laterality: Right;   PERIPHERAL VASCULAR ATHERECTOMY Right 04/22/2018   Procedure: PERIPHERAL VASCULAR ATHERECTOMY;  Surgeon: Serene Gaile ORN, MD;  [facility];  Service: Cardiovascular;  Laterality: Right;  right superficial femoral   PERIPHERAL VASCULAR BALLOON ANGIOPLASTY Right 04/22/2018   Procedure: PERIPHERAL VASCULAR BALLOON ANGIOPLASTY;  Surgeon: Serene Gaile ORN, MD;  [facility];  Service: Cardiovascular;  Laterality: Right;  external iliac   PERIPHERAL VASCULAR BALLOON ANGIOPLASTY Left 03/08/2020   Procedure: PERIPHERAL VASCULAR BALLOON ANGIOPLASTY;  Surgeon: Serene Gaile ORN, MD;  [facility];  Service: Cardiovascular;  Laterality: Left;  Common Femoral   PERIPHERAL VASCULAR BALLOON ANGIOPLASTY Left 08/23/2020   Procedure: PERIPHERAL VASCULAR BALLOON ANGIOPLASTY;  Surgeon: Serene Gaile ORN, MD;  [facility];  Service: Cardiovascular;  Laterality: Left;  common femoral   PERIPHERAL VASCULAR BALLOON ANGIOPLASTY Left 10/31/2021   Procedure:  PERIPHERAL VASCULAR BALLOON ANGIOPLASTY;  Surgeon: Serene Gaile ORN, MD;  [facility];  Service: Cardiovascular;  Laterality: Left;  external iliac   PERIPHERAL VASCULAR INTERVENTION Right 03/08/2020   Procedure: PERIPHERAL VASCULAR INTERVENTION;  Surgeon: Serene Gaile ORN, MD;  [facility];  Service: Cardiovascular;  Laterality: Right;  SFA   PERIPHERAL VASCULAR INTERVENTION Right 08/23/2020   Procedure: PERIPHERAL VASCULAR INTERVENTION;  Surgeon: Serene Gaile ORN, MD;  [facility];  Service: Cardiovascular;  Laterality: Right;  external iliac   PERIPHERAL VASCULAR INTERVENTION Right 10/31/2021   Procedure: PERIPHERAL VASCULAR INTERVENTION;  Surgeon: Serene Gaile ORN, MD;  [facility];  Service: Cardiovascular;  Laterality: Right;  superficial femoral artery   PERIPHERAL VASCULAR INTERVENTION Left 12/19/2021   Procedure: PERIPHERAL VASCULAR INTERVENTION;  Surgeon: Serene Gaile ORN, MD;  [facility];  Service: Cardiovascular;  Laterality: Left;  stent lt ext iliac / pta common femoral   PERIPHERAL VASCULAR INTERVENTION  02/19/2023   Procedure: PERIPHERAL VASCULAR INTERVENTION;  Surgeon: Serene Gaile ORN, MD;  [facility];  Service: Cardiovascular;;   RADIOLOGY WITH ANESTHESIA [name] 07/01/2019   Procedure: STENTING;  Surgeon: Dolphus Carrion, MD;  [facility];  Service: Radiology;  Laterality: [name];   RADIOLOGY WITH ANESTHESIA [name] 07/22/2019   Procedure: STENTING;  Surgeon: Dolphus Carrion, MD;  [facility];  Service: Radiology;  Laterality: [name];   TOTAL SHOULDER ARTHROPLASTY Right 2011   Dr. Dozier COWBOY NERVE TRANSPOSITION Left 02/02/2016   Procedure: LEFT IN-SITU DECOMPRESSION ULNAR NERVE ;  Surgeon: Arley Curia, MD;  Location: [facility];  Service: Orthopedics;  Laterality: Left;   ULNAR TUNNEL RELEASE Left 02/02/2016   Procedure: LEFT CUBITAL TUNNEL RELEASE;  Surgeon: Arley Curia, MD;  Location: [facility];  Service: Orthopedics;  Laterality: Left;   VASECTOMY  1971   VEIN HARVEST Left 08/16/2017   Procedure: USING NON REVERSE LEFT GREATER SAPHENOUS VEIN HARVEST;  Surgeon: Serene Gaile ORN, MD;  [facility];  Service: Vascular;  Laterality: Left;   VIDEO BRONCHOSCOPY WITH ENDOBRONCHIAL NAVIGATION [name] 07/30/2024   Procedure: VIDEO BRONCHOSCOPY WITH ENDOBRONCHIAL NAVIGATION;  Surgeon: Kerrin Elspeth BROCKS, MD;  [facility];  Service: Thoracic;  Laterality: [name];   VIDEO BRONCHOSCOPY WITH ENDOBRONCHIAL ULTRASOUND [name] 07/30/2024   Procedure: BRONCHOSCOPY, WITH EBUS;  Surgeon: Kerrin Elspeth BROCKS, MD;  [facility];  Service: Thoracic;  Laterality: [name];  ROBOTIC BRONCHOSCOPY WITH EBUS    MEDICATIONS:  aspirin  EC 81 MG tablet   atorvastatin  (LIPITOR ) 20 MG tablet   BRILINTA  90 MG TABS tablet   carvedilol  (COREG ) 6.25 MG tablet   cetirizine (ZYRTEC) 10 MG tablet   clotrimazole -betamethasone  (LOTRISONE ) cream   Cobalamin Combinations (NEURIVA PLUS) CAPS   dextromethorphan -guaiFENesin  (MUCINEX  DM) 30-600 MG 12hr tablet   gabapentin  (NEURONTIN ) 300 MG capsule   HYDROcodone -acetaminophen  (NORCO) 10-325 MG tablet   HYDROcodone -acetaminophen  (NORCO/VICODIN) 5-325 MG tablet   Magnesium  250 MG TABS   Multiple Vitamins-Minerals (CENTRUM SILVER PO)   Naphazoline HCl (CLEAR EYES OP)   Omega-3 Fatty Acids (FISH OIL ) 1000 MG CAPS   sacubitril -valsartan  (ENTRESTO ) 97-103 MG   Salicylic Acid (GOLD BOND PSORIASIS RELIEF) 3 % CREA   silodosin (RAPAFLO) 8 MG CAPS capsule   SUPER B COMPLEX/C PO   tamsulosin  (FLOMAX ) 0.4 MG CAPS capsule   traMADol (ULTRAM) 50 MG tablet   No current facility-administered medications for this encounter.    Isaiah Ruder, PA-C Surgical Short Stay/Anesthesiology [facility] Phone [phone] [facility] Phone [phone] 08/13/2024 3:56 PM

## 2024-08-13 NOTE — Anesthesia Preprocedure Evaluation (Addendum)
 Anesthesia Evaluation  Patient identified by MRN, date of birth, ID band Patient awake    Reviewed: Allergy & Precautions, NPO status , Patient's Chart, lab work & pertinent test results, reviewed documented beta blocker date and time   History of Anesthesia Complications Negative for: history of anesthetic complications  Airway Mallampati: I  TM Distance: >3 FB Neck ROM: Full    Dental  (+) Dental Advisory Given, Edentulous Upper, Edentulous Lower   Pulmonary COPD, neg recent URI, Current Smoker and Patient abstained from smoking.   breath sounds clear to auscultation       Cardiovascular hypertension, + CAD, + Past MI, + Peripheral Vascular Disease and +CHF  + dysrhythmias (LBBB) + Cardiac Defibrillator ([employer])  Rhythm:Regular Rate:Normal  TTE (2025): 1. Left ventricular ejection fraction, by estimation, is 60 to 65%. Left  ventricular ejection fraction by 3D volume is 54 %. The left ventricle has  normal function. The left ventricle has no regional wall motion  abnormalities. Left ventricular diastolic   parameters are consistent with Grade I diastolic dysfunction (impaired  relaxation). The average left ventricular global longitudinal strain is  -18.8 %. The global longitudinal strain is normal.   2. Right ventricular systolic function is normal. The right ventricular  size is normal. There is normal pulmonary artery systolic pressure.   3. The mitral valve is normal in structure. Mild mitral valve  regurgitation. No evidence of mitral stenosis.   4. The aortic valve was not well visualized. Aortic valve regurgitation  is not visualized. No aortic stenosis is present.   5. The inferior vena cava is normal in size with greater than 50%  respiratory variability, suggesting right atrial pressure of 3 mmHg.     Neuro/Psych    GI/Hepatic ,neg GERD  ,,  Endo/Other    Renal/GU Renal InsufficiencyRenal disease      Musculoskeletal   Abdominal   Peds  Hematology  (+) Blood dyscrasia, anemia Hx of Thrombocytopenia    Anesthesia Other Findings   Reproductive/Obstetrics                              Anesthesia Physical Anesthesia Plan  ASA: 3  Anesthesia Plan: General   Post-op Pain Management:    Induction: Intravenous  PONV Risk Score and Plan: 1  Airway Management Planned: Double Lumen EBT  Additional Equipment: Arterial line  Intra-op Plan:   Post-operative Plan: Extubation in OR  Informed Consent:      Dental advisory given  Plan Discussed with:   Anesthesia Plan Comments: (ICD turned off by [employer]ott representative. Labs reviewed. Brillinta held appropriately. Plan for DLT, arterial line, PIV x 2. )         Anesthesia Quick Evaluation

## 2024-08-14 ENCOUNTER — Ambulatory Visit: Payer: Self-pay | Admitting: Internal Medicine

## 2024-08-17 NOTE — Progress Notes (Signed)
Remote ICD Transmission.

## 2024-08-19 ENCOUNTER — Inpatient Hospital Stay (HOSPITAL_COMMUNITY)
Admission: RE | Admit: 2024-08-19 | Discharge: 2024-08-23 | DRG: 163 | Disposition: A | Discharge status: Home / Self Care | Attending: Thoracic Surgery (Cardiothoracic Vascular Surgery) | Admitting: Thoracic Surgery (Cardiothoracic Vascular Surgery)

## 2024-08-19 ENCOUNTER — Encounter (HOSPITAL_COMMUNITY): Payer: Self-pay | Admitting: Thoracic Surgery (Cardiothoracic Vascular Surgery)

## 2024-08-19 ENCOUNTER — Inpatient Hospital Stay (HOSPITAL_COMMUNITY): Payer: Self-pay | Admitting: Certified Registered Nurse Anesthetist

## 2024-08-19 ENCOUNTER — Inpatient Hospital Stay (HOSPITAL_COMMUNITY): Payer: Self-pay | Admitting: Physician Assistant

## 2024-08-19 ENCOUNTER — Encounter (HOSPITAL_COMMUNITY)
Admission: RE | Disposition: A | Discharge status: Home / Self Care | Payer: Self-pay | Source: Home / Self Care | Attending: Thoracic Surgery (Cardiothoracic Vascular Surgery)

## 2024-08-19 ENCOUNTER — Other Ambulatory Visit: Payer: Self-pay

## 2024-08-19 ENCOUNTER — Inpatient Hospital Stay (HOSPITAL_COMMUNITY)

## 2024-08-19 DIAGNOSIS — G8929 Other chronic pain: Secondary | ICD-10-CM | POA: Diagnosis present

## 2024-08-19 DIAGNOSIS — Z85118 Personal history of other malignant neoplasm of bronchus and lung: Secondary | ICD-10-CM | POA: Diagnosis not present

## 2024-08-19 DIAGNOSIS — Z79891 Long term (current) use of opiate analgesic: Secondary | ICD-10-CM

## 2024-08-19 DIAGNOSIS — Z7902 Long term (current) use of antithrombotics/antiplatelets: Secondary | ICD-10-CM | POA: Diagnosis not present

## 2024-08-19 DIAGNOSIS — Z9852 Vasectomy status: Secondary | ICD-10-CM | POA: Diagnosis not present

## 2024-08-19 DIAGNOSIS — Z961 Presence of intraocular lens: Secondary | ICD-10-CM | POA: Diagnosis present

## 2024-08-19 DIAGNOSIS — Z7982 Long term (current) use of aspirin: Secondary | ICD-10-CM

## 2024-08-19 DIAGNOSIS — Z860101 Personal history of adenomatous and serrated colon polyps: Secondary | ICD-10-CM | POA: Diagnosis not present

## 2024-08-19 DIAGNOSIS — J439 Emphysema, unspecified: Secondary | ICD-10-CM | POA: Diagnosis present

## 2024-08-19 DIAGNOSIS — Z950 Presence of cardiac pacemaker: Secondary | ICD-10-CM | POA: Diagnosis not present

## 2024-08-19 DIAGNOSIS — K7460 Unspecified cirrhosis of liver: Secondary | ICD-10-CM | POA: Diagnosis present

## 2024-08-19 DIAGNOSIS — C3491 Malignant neoplasm of unspecified part of right bronchus or lung: Secondary | ICD-10-CM

## 2024-08-19 DIAGNOSIS — I10 Essential (primary) hypertension: Secondary | ICD-10-CM

## 2024-08-19 DIAGNOSIS — Z8673 Personal history of transient ischemic attack (TIA), and cerebral infarction without residual deficits: Secondary | ICD-10-CM

## 2024-08-19 DIAGNOSIS — C3431 Malignant neoplasm of lower lobe, right bronchus or lung: Principal | ICD-10-CM | POA: Diagnosis present

## 2024-08-19 DIAGNOSIS — I110 Hypertensive heart disease with heart failure: Secondary | ICD-10-CM | POA: Diagnosis present

## 2024-08-19 DIAGNOSIS — Z01818 Encounter for other preprocedural examination: Secondary | ICD-10-CM | POA: Diagnosis not present

## 2024-08-19 DIAGNOSIS — Z48813 Encounter for surgical aftercare following surgery on the respiratory system: Secondary | ICD-10-CM | POA: Diagnosis not present

## 2024-08-19 DIAGNOSIS — Z87891 Personal history of nicotine dependence: Secondary | ICD-10-CM

## 2024-08-19 DIAGNOSIS — J939 Pneumothorax, unspecified: Secondary | ICD-10-CM | POA: Diagnosis not present

## 2024-08-19 DIAGNOSIS — Z95810 Presence of automatic (implantable) cardiac defibrillator: Secondary | ICD-10-CM | POA: Diagnosis not present

## 2024-08-19 DIAGNOSIS — I252 Old myocardial infarction: Secondary | ICD-10-CM | POA: Diagnosis not present

## 2024-08-19 DIAGNOSIS — I2510 Atherosclerotic heart disease of native coronary artery without angina pectoris: Secondary | ICD-10-CM

## 2024-08-19 DIAGNOSIS — Z9841 Cataract extraction status, right eye: Secondary | ICD-10-CM | POA: Diagnosis not present

## 2024-08-19 DIAGNOSIS — Z981 Arthrodesis status: Secondary | ICD-10-CM

## 2024-08-19 DIAGNOSIS — D62 Acute posthemorrhagic anemia: Secondary | ICD-10-CM | POA: Diagnosis not present

## 2024-08-19 DIAGNOSIS — Z4682 Encounter for fitting and adjustment of non-vascular catheter: Secondary | ICD-10-CM | POA: Diagnosis not present

## 2024-08-19 DIAGNOSIS — I739 Peripheral vascular disease, unspecified: Secondary | ICD-10-CM | POA: Diagnosis present

## 2024-08-19 DIAGNOSIS — J984 Other disorders of lung: Secondary | ICD-10-CM | POA: Diagnosis not present

## 2024-08-19 DIAGNOSIS — T797XXA Traumatic subcutaneous emphysema, initial encounter: Secondary | ICD-10-CM | POA: Diagnosis not present

## 2024-08-19 DIAGNOSIS — F17210 Nicotine dependence, cigarettes, uncomplicated: Secondary | ICD-10-CM | POA: Diagnosis not present

## 2024-08-19 DIAGNOSIS — Z96611 Presence of right artificial shoulder joint: Secondary | ICD-10-CM | POA: Diagnosis present

## 2024-08-19 DIAGNOSIS — R918 Other nonspecific abnormal finding of lung field: Secondary | ICD-10-CM | POA: Diagnosis not present

## 2024-08-19 DIAGNOSIS — Z9889 Other specified postprocedural states: Principal | ICD-10-CM

## 2024-08-19 DIAGNOSIS — J9382 Other air leak: Secondary | ICD-10-CM | POA: Diagnosis present

## 2024-08-19 DIAGNOSIS — Z9842 Cataract extraction status, left eye: Secondary | ICD-10-CM | POA: Diagnosis not present

## 2024-08-19 DIAGNOSIS — J953 Chronic pulmonary insufficiency following surgery: Secondary | ICD-10-CM | POA: Diagnosis not present

## 2024-08-19 DIAGNOSIS — E785 Hyperlipidemia, unspecified: Secondary | ICD-10-CM | POA: Diagnosis present

## 2024-08-19 DIAGNOSIS — J9811 Atelectasis: Secondary | ICD-10-CM | POA: Diagnosis not present

## 2024-08-19 DIAGNOSIS — Z902 Acquired absence of lung [part of]: Secondary | ICD-10-CM

## 2024-08-19 DIAGNOSIS — Z79899 Other long term (current) drug therapy: Secondary | ICD-10-CM

## 2024-08-19 HISTORY — PX: LOBECTOMY, LUNG, ROBOT-ASSISTED, USING VATS: SHX7607

## 2024-08-19 HISTORY — PX: INTERCOSTAL NERVE BLOCK: SHX5021

## 2024-08-19 HISTORY — PX: LYMPH NODE BIOPSY: SHX201

## 2024-08-19 LAB — PREPARE RBC (CROSSMATCH)

## 2024-08-19 SURGERY — LOBECTOMY, LUNG, ROBOT-ASSISTED, USING VATS
Anesthesia: General | Site: Chest | Laterality: Right

## 2024-08-19 MED ORDER — ONDANSETRON HCL 4 MG/2ML IJ SOLN
INTRAMUSCULAR | Status: AC
  Filled 2024-08-19: qty 2

## 2024-08-19 MED ORDER — BUPIVACAINE LIPOSOME 1.3 % IJ SUSP
INTRAMUSCULAR | Status: AC
  Filled 2024-08-19: qty 20

## 2024-08-19 MED ORDER — HYDROCODONE-ACETAMINOPHEN 10-325 MG PO TABS
1.0000 | ORAL_TABLET | Freq: Every day | ORAL | Status: DC
  Administered 2024-08-20 – 2024-08-22 (×3): 1 via ORAL
  Filled 2024-08-19 (×3): qty 1

## 2024-08-19 MED ORDER — HEMOSTATIC AGENTS (NO CHARGE) OPTIME
TOPICAL | Status: DC | PRN
  Administered 2024-08-19 (×2): 1 via TOPICAL

## 2024-08-19 MED ORDER — SACUBITRIL-VALSARTAN 97-103 MG PO TABS
1.0000 | ORAL_TABLET | Freq: Two times a day (BID) | ORAL | Status: DC
  Administered 2024-08-19 – 2024-08-23 (×8): 1 via ORAL
  Filled 2024-08-19 (×8): qty 1

## 2024-08-19 MED ORDER — CARVEDILOL 6.25 MG PO TABS
6.2500 mg | ORAL_TABLET | Freq: Two times a day (BID) | ORAL | Status: DC
  Administered 2024-08-19 – 2024-08-23 (×8): 6.25 mg via ORAL
  Filled 2024-08-19 (×8): qty 1

## 2024-08-19 MED ORDER — ROCURONIUM BROMIDE 10 MG/ML (PF) SYRINGE
PREFILLED_SYRINGE | INTRAVENOUS | Status: DC | PRN
  Administered 2024-08-19 (×2): 50 mg via INTRAVENOUS

## 2024-08-19 MED ORDER — LABETALOL HCL 5 MG/ML IV SOLN
INTRAVENOUS | Status: DC | PRN
  Administered 2024-08-19 (×2): 5 mg via INTRAVENOUS

## 2024-08-19 MED ORDER — SENNOSIDES-DOCUSATE SODIUM 8.6-50 MG PO TABS
1.0000 | ORAL_TABLET | Freq: Every day | ORAL | Status: DC
  Administered 2024-08-20 – 2024-08-21 (×2): 1 via ORAL
  Filled 2024-08-19 (×2): qty 1

## 2024-08-19 MED ORDER — CEFAZOLIN SODIUM-DEXTROSE 2-4 GM/100ML-% IV SOLN
INTRAVENOUS | Status: AC
  Filled 2024-08-19: qty 100

## 2024-08-19 MED ORDER — PHENYLEPHRINE 80 MCG/ML (10ML) SYRINGE FOR IV PUSH (FOR BLOOD PRESSURE SUPPORT)
PREFILLED_SYRINGE | INTRAVENOUS | Status: DC | PRN
  Administered 2024-08-19: 160 ug via INTRAVENOUS
  Administered 2024-08-19: 240 ug via INTRAVENOUS

## 2024-08-19 MED ORDER — HYDROCODONE-ACETAMINOPHEN 5-325 MG PO TABS
2.0000 | ORAL_TABLET | Freq: Two times a day (BID) | ORAL | Status: DC
  Administered 2024-08-19 – 2024-08-20 (×2): 2 via ORAL
  Filled 2024-08-19 (×3): qty 2

## 2024-08-19 MED ORDER — PROPOFOL 10 MG/ML IV BOLUS
INTRAVENOUS | Status: AC
  Filled 2024-08-19: qty 20

## 2024-08-19 MED ORDER — LIDOCAINE 2% (20 MG/ML) 5 ML SYRINGE
INTRAMUSCULAR | Status: DC | PRN
  Administered 2024-08-19: 60 mg via INTRAVENOUS

## 2024-08-19 MED ORDER — GABAPENTIN 300 MG PO CAPS
300.0000 mg | ORAL_CAPSULE | Freq: Two times a day (BID) | ORAL | Status: DC
  Administered 2024-08-19 – 2024-08-23 (×9): 300 mg via ORAL
  Filled 2024-08-19 (×9): qty 1

## 2024-08-19 MED ORDER — FENTANYL CITRATE (PF) 250 MCG/5ML IJ SOLN
INTRAMUSCULAR | Status: AC
  Filled 2024-08-19: qty 5

## 2024-08-19 MED ORDER — DEXAMETHASONE SODIUM PHOSPHATE 10 MG/ML IJ SOLN
INTRAMUSCULAR | Status: DC | PRN
  Administered 2024-08-19: 8 mg via INTRAVENOUS

## 2024-08-19 MED ORDER — SODIUM CHLORIDE FLUSH 0.9 % IV SOLN
INTRAVENOUS | Status: DC | PRN
  Administered 2024-08-19: 100 mL

## 2024-08-19 MED ORDER — ROCURONIUM BROMIDE 10 MG/ML (PF) SYRINGE
PREFILLED_SYRINGE | INTRAVENOUS | Status: AC
  Filled 2024-08-19: qty 10

## 2024-08-19 MED ORDER — SODIUM CHLORIDE 0.45 % IV SOLN: INTRAVENOUS | Status: AC

## 2024-08-19 MED ORDER — TRAMADOL HCL 50 MG PO TABS
50.0000 mg | ORAL_TABLET | Freq: Two times a day (BID) | ORAL | Status: DC | PRN
Start: 2024-08-19 — End: 2024-08-21
  Administered 2024-08-21: 50 mg via ORAL
  Filled 2024-08-19: qty 1

## 2024-08-19 MED ORDER — LACTATED RINGERS IV SOLN
INTRAVENOUS | Status: DC
Start: 2024-08-19 — End: 2024-08-19

## 2024-08-19 MED ORDER — TAMSULOSIN HCL 0.4 MG PO CAPS
0.4000 mg | ORAL_CAPSULE | Freq: Every day | ORAL | Status: DC
Start: 2024-08-19 — End: 2024-08-23
  Administered 2024-08-19 – 2024-08-22 (×4): 0.4 mg via ORAL
  Filled 2024-08-19 (×4): qty 1

## 2024-08-19 MED ORDER — CHLORHEXIDINE GLUCONATE CLOTH 2 % EX PADS
6.0000 | MEDICATED_PAD | Freq: Every day | CUTANEOUS | Status: DC
  Administered 2024-08-20: 6 via TOPICAL

## 2024-08-19 MED ORDER — ALBUMIN HUMAN 5 % IV SOLN: INTRAVENOUS | Status: DC | PRN

## 2024-08-19 MED ORDER — ENOXAPARIN SODIUM 40 MG/0.4ML IJ SOSY
40.0000 mg | PREFILLED_SYRINGE | Freq: Every day | INTRAMUSCULAR | Status: DC
  Administered 2024-08-19 – 2024-08-23 (×5): 40 mg via SUBCUTANEOUS
  Filled 2024-08-19 (×5): qty 0.4

## 2024-08-19 MED ORDER — CLOTRIMAZOLE-BETAMETHASONE 1-0.05 % EX CREA
1.0000 | TOPICAL_CREAM | Freq: Two times a day (BID) | CUTANEOUS | Status: DC
  Filled 2024-08-19: qty 15

## 2024-08-19 MED ORDER — BISACODYL 5 MG PO TBEC
10.0000 mg | DELAYED_RELEASE_TABLET | Freq: Every day | ORAL | Status: DC
  Administered 2024-08-19 – 2024-08-21 (×3): 10 mg via ORAL
  Filled 2024-08-19 (×5): qty 2

## 2024-08-19 MED ORDER — SODIUM CHLORIDE (PF) 0.9 % IJ SOLN
INTRAMUSCULAR | Status: AC
Start: 2024-08-19 — End: 2024-08-19
  Filled 2024-08-19: qty 50

## 2024-08-19 MED ORDER — CEFAZOLIN SODIUM-DEXTROSE 2-4 GM/100ML-% IV SOLN
2.0000 g | INTRAVENOUS | Status: AC
  Administered 2024-08-19: 2 g via INTRAVENOUS

## 2024-08-19 MED ORDER — ACETAMINOPHEN 10 MG/ML IV SOLN: 1000.0000 mg | Freq: Once | INTRAVENOUS | Status: DC | PRN

## 2024-08-19 MED ORDER — SODIUM CHLORIDE 0.9% IV SOLUTION
INTRAVENOUS | Status: AC | PRN
  Administered 2024-08-19: 1000 mL

## 2024-08-19 MED ORDER — PANTOPRAZOLE SODIUM 40 MG PO TBEC
40.0000 mg | DELAYED_RELEASE_TABLET | Freq: Every day | ORAL | Status: DC
  Administered 2024-08-20 – 2024-08-23 (×4): 40 mg via ORAL
  Filled 2024-08-19 (×4): qty 1

## 2024-08-19 MED ORDER — SODIUM CHLORIDE 0.9% IV SOLUTION: Freq: Once | INTRAVENOUS | Status: DC

## 2024-08-19 MED ORDER — FENTANYL CITRATE (PF) 250 MCG/5ML IJ SOLN
INTRAMUSCULAR | Status: DC | PRN
  Administered 2024-08-19 (×4): 50 ug via INTRAVENOUS

## 2024-08-19 MED ORDER — OXYCODONE HCL 5 MG PO TABS: 5.0000 mg | ORAL_TABLET | Freq: Once | ORAL | Status: DC | PRN

## 2024-08-19 MED ORDER — LUNG SURGERY BOOK
Freq: Once | Status: AC
  Filled 2024-08-19: qty 1

## 2024-08-19 MED ORDER — DM-GUAIFENESIN ER 30-600 MG PO TB12
1.0000 | ORAL_TABLET | Freq: Two times a day (BID) | ORAL | Status: DC
  Administered 2024-08-19 – 2024-08-23 (×9): 1 via ORAL
  Filled 2024-08-19 (×9): qty 1

## 2024-08-19 MED ORDER — PHENYLEPHRINE HCL-NACL 20-0.9 MG/250ML-% IV SOLN
INTRAVENOUS | Status: DC | PRN
  Administered 2024-08-19: 35 ug/min via INTRAVENOUS

## 2024-08-19 MED ORDER — ORAL CARE MOUTH RINSE: 15.0000 mL | Freq: Once | OROMUCOSAL | Status: AC

## 2024-08-19 MED ORDER — ACETAMINOPHEN 10 MG/ML IV SOLN
INTRAVENOUS | Status: DC | PRN
  Administered 2024-08-19: 1000 mg via INTRAVENOUS

## 2024-08-19 MED ORDER — LIDOCAINE 2% (20 MG/ML) 5 ML SYRINGE
INTRAMUSCULAR | Status: AC
  Filled 2024-08-19: qty 5

## 2024-08-19 MED ORDER — CHLORHEXIDINE GLUCONATE 0.12 % MT SOLN
OROMUCOSAL | Status: AC
  Administered 2024-08-19: 15 mL via OROMUCOSAL
  Filled 2024-08-19: qty 15

## 2024-08-19 MED ORDER — LABETALOL HCL 5 MG/ML IV SOLN
INTRAVENOUS | Status: AC
  Filled 2024-08-19: qty 4

## 2024-08-19 MED ORDER — CEFAZOLIN SODIUM-DEXTROSE 2-4 GM/100ML-% IV SOLN
2.0000 g | Freq: Three times a day (TID) | INTRAVENOUS | Status: AC
  Administered 2024-08-19 (×2): 2 g via INTRAVENOUS
  Filled 2024-08-19 (×2): qty 100

## 2024-08-19 MED ORDER — DEXAMETHASONE SODIUM PHOSPHATE 10 MG/ML IJ SOLN
INTRAMUSCULAR | Status: AC
  Filled 2024-08-19: qty 1

## 2024-08-19 MED ORDER — OXYCODONE HCL 5 MG/5ML PO SOLN: 5.0000 mg | Freq: Once | ORAL | Status: DC | PRN

## 2024-08-19 MED ORDER — ONDANSETRON HCL 4 MG/2ML IJ SOLN: 4.0000 mg | Freq: Once | INTRAMUSCULAR | Status: DC | PRN

## 2024-08-19 MED ORDER — ONDANSETRON HCL 4 MG/2ML IJ SOLN
INTRAMUSCULAR | Status: DC | PRN
  Administered 2024-08-19: 4 mg via INTRAVENOUS

## 2024-08-19 MED ORDER — ATORVASTATIN CALCIUM 10 MG PO TABS
20.0000 mg | ORAL_TABLET | Freq: Every evening | ORAL | Status: DC
Start: 2024-08-19 — End: 2024-08-23
  Administered 2024-08-19 – 2024-08-22 (×4): 20 mg via ORAL
  Filled 2024-08-19 (×4): qty 2

## 2024-08-19 MED ORDER — ASPIRIN 81 MG PO TBEC
81.0000 mg | DELAYED_RELEASE_TABLET | Freq: Every evening | ORAL | Status: DC
Start: 2024-08-20 — End: 2024-08-23
  Administered 2024-08-20 – 2024-08-22 (×3): 81 mg via ORAL
  Filled 2024-08-19 (×4): qty 1

## 2024-08-19 MED ORDER — ONDANSETRON HCL 4 MG/2ML IJ SOLN: 4.0000 mg | Freq: Four times a day (QID) | INTRAMUSCULAR | Status: DC | PRN

## 2024-08-19 MED ORDER — CHLORHEXIDINE GLUCONATE 0.12 % MT SOLN: 15.0000 mL | Freq: Once | OROMUCOSAL | Status: AC

## 2024-08-19 MED ORDER — MORPHINE SULFATE (PF) 2 MG/ML IV SOLN
1.0000 mg | INTRAVENOUS | Status: DC | PRN
  Administered 2024-08-20 – 2024-08-22 (×4): 1 mg via INTRAVENOUS
  Filled 2024-08-19 (×5): qty 1

## 2024-08-19 MED ORDER — FENTANYL CITRATE (PF) 100 MCG/2ML IJ SOLN
25.0000 ug | INTRAMUSCULAR | Status: DC | PRN
  Administered 2024-08-19: 50 ug via INTRAVENOUS

## 2024-08-19 MED ORDER — BUPIVACAINE HCL (PF) 0.5 % IJ SOLN
INTRAMUSCULAR | Status: AC
  Filled 2024-08-19: qty 30

## 2024-08-19 MED ORDER — 0.9 % SODIUM CHLORIDE (POUR BTL) OPTIME
TOPICAL | Status: DC | PRN
  Administered 2024-08-19: 2000 mL

## 2024-08-19 MED ORDER — SUGAMMADEX SODIUM 200 MG/2ML IV SOLN
INTRAVENOUS | Status: DC | PRN
  Administered 2024-08-19: 200 mg via INTRAVENOUS

## 2024-08-19 MED ORDER — LACTATED RINGERS IV SOLN: INTRAVENOUS | Status: DC | PRN

## 2024-08-19 MED ORDER — FENTANYL CITRATE (PF) 100 MCG/2ML IJ SOLN
INTRAMUSCULAR | Status: AC
  Filled 2024-08-19: qty 2

## 2024-08-19 MED ORDER — DROPERIDOL 2.5 MG/ML IJ SOLN: 0.6250 mg | Freq: Once | INTRAMUSCULAR | Status: DC | PRN

## 2024-08-19 MED ORDER — PHENYLEPHRINE 80 MCG/ML (10ML) SYRINGE FOR IV PUSH (FOR BLOOD PRESSURE SUPPORT)
PREFILLED_SYRINGE | INTRAVENOUS | Status: AC
  Filled 2024-08-19: qty 10

## 2024-08-19 MED ORDER — HYDROCODONE-ACETAMINOPHEN 5-325 MG PO TABS
1.0000 | ORAL_TABLET | Freq: Every day | ORAL | Status: DC | PRN
  Administered 2024-08-20: 1 via ORAL
  Filled 2024-08-19: qty 1

## 2024-08-19 MED ORDER — PROPOFOL 10 MG/ML IV BOLUS
INTRAVENOUS | Status: DC | PRN
Start: 2024-08-19 — End: 2024-08-19
  Administered 2024-08-19 (×2): 100 mg via INTRAVENOUS

## 2024-08-19 MED ORDER — ORAL CARE MOUTH RINSE: 15.0000 mL | OROMUCOSAL | Status: DC | PRN

## 2024-08-19 SURGICAL SUPPLY — 66 items
BLADE CLIPPER SURG (BLADE) IMPLANT
CANISTER SUCTION 3000ML PPV (SUCTIONS) ×4 IMPLANT
CANNULA REDUCER 12-8 DVNC XI (CANNULA) ×4 IMPLANT
CATH THORACIC 28FR (CATHETERS) IMPLANT
CNTNR URN SCR LID CUP LEK RST (MISCELLANEOUS) ×10 IMPLANT
CONN ST 1/4X3/8 BEN (MISCELLANEOUS) IMPLANT
DEFOGGER SCOPE WARM SEASHARP (MISCELLANEOUS) ×2 IMPLANT
DERMABOND ADVANCED .7 DNX12 (GAUZE/BANDAGES/DRESSINGS) ×2 IMPLANT
DRAIN CHANNEL 28F RND 3/8 FF (WOUND CARE) IMPLANT
DRAPE ARM DVNC X/XI (DISPOSABLE) ×8 IMPLANT
DRAPE COLUMN DVNC XI (DISPOSABLE) ×2 IMPLANT
DRAPE CV SPLIT W-CLR ANES SCRN (DRAPES) ×2 IMPLANT
DRAPE HALF SHEET 40X57 (DRAPES) ×2 IMPLANT
DRAPE SURG ORHT 6 SPLT 77X108 (DRAPES) ×2 IMPLANT
ELECT BLADE 6.5 EXT (BLADE) ×2 IMPLANT
ELECTRODE REM PT RTRN 9FT ADLT (ELECTROSURGICAL) ×2 IMPLANT
FORCEPS BPLR FENES DVNC XI (FORCEP) IMPLANT
FORCEPS BPLR LNG DVNC XI (INSTRUMENTS) IMPLANT
FORCEPS BPLR R/ABLATION 8 DVNC (INSTRUMENTS) IMPLANT
GAUZE KITTNER 4X5 RF (MISCELLANEOUS) ×4 IMPLANT
GAUZE SPONGE 4X4 12PLY STRL (GAUZE/BANDAGES/DRESSINGS) ×2 IMPLANT
GLOVE SS BIOGEL STRL SZ 7.5 (GLOVE) ×4 IMPLANT
GLOVE SURG POLYISO LF SZ8 (GLOVE) ×2 IMPLANT
GOWN STRL REUS W/ TWL LRG LVL3 (GOWN DISPOSABLE) ×4 IMPLANT
GOWN STRL REUS W/ TWL XL LVL3 (GOWN DISPOSABLE) ×4 IMPLANT
GOWN STRL REUS W/TWL 2XL LVL3 (GOWN DISPOSABLE) ×2 IMPLANT
GRASPER TIP-UP FEN DVNC XI (INSTRUMENTS) IMPLANT
HEMOSTAT SURGICEL 2X14 (HEMOSTASIS) ×6 IMPLANT
IRRIGATION STRYKERFLOW (MISCELLANEOUS) ×2 IMPLANT
KIT BASIN OR (CUSTOM PROCEDURE TRAY) ×2 IMPLANT
KIT TURNOVER KIT B (KITS) ×2 IMPLANT
NDL HYPO 25GX1X1/2 BEV (NEEDLE) ×2 IMPLANT
NDL SPNL 22GX3.5 QUINCKE BK (NEEDLE) ×2 IMPLANT
NEEDLE HYPO 25GX1X1/2 BEV (NEEDLE) ×1 IMPLANT
NEEDLE SPNL 22GX3.5 QUINCKE BK (NEEDLE) ×1 IMPLANT
PACK CHEST (CUSTOM PROCEDURE TRAY) ×2 IMPLANT
PAD ARMBOARD POSITIONER FOAM (MISCELLANEOUS) ×4 IMPLANT
PORT ACCESS TROCAR AIRSEAL 12 (TROCAR) ×2 IMPLANT
RELOAD STAPLE 45 2.5 WHT DVNC (STAPLE) IMPLANT
RELOAD STAPLE 45 3.5 BLU DVNC (STAPLE) IMPLANT
RELOAD STAPLE 45 4.3 GRN DVNC (STAPLE) IMPLANT
SEAL UNIV 5-12 XI (MISCELLANEOUS) ×8 IMPLANT
SEALANT PROGEL (MISCELLANEOUS) IMPLANT
SET TRI-LUMEN FLTR TB AIRSEAL (TUBING) ×2 IMPLANT
SOLN 0.9% NACL 1000 ML (IV SOLUTION) ×2 IMPLANT
SOLN 0.9% NACL POUR BTL 1000ML (IV SOLUTION) ×4 IMPLANT
SOLN STERILE WATER 1000 ML (IV SOLUTION) ×1 IMPLANT
SOLN STERILE WATER BTL 1000 ML (IV SOLUTION) ×4 IMPLANT
SOLUTION ELECTROSURG ANTI STCK (MISCELLANEOUS) ×2 IMPLANT
SPONGE TONSIL 1 RF SGL (DISPOSABLE) IMPLANT
STAPLER 45 SUREFORM CVD DVNC (STAPLE) IMPLANT
SUT SILK 1 MH (SUTURE) ×2 IMPLANT
SUT SILK 2 0 SH (SUTURE) ×2 IMPLANT
SUT VIC AB 1 CTX36XBRD ANBCTR (SUTURE) ×2 IMPLANT
SUT VIC AB 2-0 CTX 36 (SUTURE) ×2 IMPLANT
SUT VIC AB 3-0 X1 27 (SUTURE) ×4 IMPLANT
SUT VICRYL 0 TIES 12 18 (SUTURE) ×2 IMPLANT
SUT VICRYL 0 UR6 27IN ABS (SUTURE) ×4 IMPLANT
SYR 20CC LL (SYRINGE) ×4 IMPLANT
SYSTEM RETRIEVAL ANCHOR 15 (MISCELLANEOUS) IMPLANT
SYSTEM SAHARA CHEST DRAIN ATS (WOUND CARE) ×2 IMPLANT
TAPE CLOTH 4X10 WHT NS (GAUZE/BANDAGES/DRESSINGS) ×2 IMPLANT
TIP SPRAY PROGEL 11IN (MISCELLANEOUS) IMPLANT
TOWEL GREEN STERILE (TOWEL DISPOSABLE) ×2 IMPLANT
TRAY FOLEY MTR SLVR 16FR STAT (SET/KITS/TRAYS/PACK) ×2 IMPLANT
TRAY WAYNE PNEUMOTHORAX 14X18 (TRAY / TRAY PROCEDURE) IMPLANT

## 2024-08-19 NOTE — Anesthesia Procedure Notes (Signed)
 Procedure Name: Intubation Date/Time: 08/19/2024 8:05 AM  Performed by: Boyce Shilling, CRNAPre-anesthesia Checklist: Patient identified, Emergency Drugs available, Suction available and Patient being monitored Patient Re-evaluated:Patient Re-evaluated prior to induction Oxygen Delivery Method: Circle System Utilized Preoxygenation: Pre-oxygenation with 100% oxygen Induction Type: IV induction Ventilation: Mask ventilation without difficulty Laryngoscope Size: Mac and 3 Grade View: Grade I Tube type: Oral Endobronchial tube: Double lumen EBT, EBT position confirmed by fiberoptic bronchoscope and Left and 37 Fr Number of attempts: 1 Airway Equipment and Method: Stylet Placement Confirmation: ETT inserted through vocal cords under direct vision, positive ETCO2 and breath sounds checked- equal and bilateral Tube secured with: Tape Dental Injury: Teeth and Oropharynx as per pre-operative assessment

## 2024-08-19 NOTE — Interval H&P Note (Signed)
 History and Physical Interval Note:  08/19/2024 7:18 AM  Roy [name]  has presented today for surgery, with the diagnosis of RLL SQUAMOUS CELL CARCINOMA.  The various methods of treatment have been discussed with the patient and family. After consideration of risks, benefits and other options for treatment, the patient has consented to  Procedure(s) with comments: LOBECTOMY, LUNG, ROBOT-ASSISTED, USING VATS (Right) - ROBOTIC RIGHT LOWER LOBECTOMY as a surgical intervention.  The patient's history has been reviewed, patient examined, no change in status, stable for surgery.  I have reviewed the patient's chart and labs.  Questions were answered to the patient's satisfaction.     Roy [name]

## 2024-08-19 NOTE — Discharge Instructions (Signed)
 Discharge Instructions:  1. You may shower, please wash incisions daily with soap and water and keep dry.  If you wish to cover wounds with dressing you may do so but please keep clean and change daily.  No tub baths or swimming until incisions have completely healed.  If your incisions become red or develop any drainage please call our office at [phone]  2. No Driving until cleared by Dr. Chrystal office and you are no longer using narcotic pain medications  3. Fever of 101.5 for at least 24 hours with no source, please contact our office at [phone]  4. Activity- up as tolerated, please walk at least 3 times per day.  Avoid strenuous activity  5. If any questions or concerns arise, please do not hesitate to contact our office at [phone]

## 2024-08-19 NOTE — Hospital Course (Addendum)
 History of Present Illness:  Roy [name] is a 78 year old man with a history of tobacco abuse, emphysema, MI, left bundle branch block, congestive heart failure, ICD, ethanol abuse and withdrawal, cirrhosis, PAD, hypertension, hyperlipidemia, and thrombocytopenia. He was found to have a 3.1 cm spiculated right lower lobe mass on a low-dose CT for lung cancer screening in May. There were multiple additional nodules scattered bilaterally.  He was referred for Thoracic surgery evaluation and was evaluated by Dr. Kerrin.  At that time the patient denied chest pain, pressure, tightness, or shortness of breath with exertion. Feels short of breath when he first lies flat on his back but that goes away after about a minute. No change in appetite or weight loss.  PET CT was reviewed and revealed the lesion to be hypermetabolic.  The patient was offered radiation vs. Surgery.  The patient wanted to have a more definitive diagnosis prior to making decision.  He underwent Robotic Bronchoscopy and endobronchial ultrasound on 07/30/2024.  Pathology from that procedure showed squamous cell carcinoma.  Due to this the patient decided to proceed with Robotic Right Lower Lobectomy.  The risks and benefits of the procedure were explained to the patient and he was agreeable to proceed.   Hospital Course:  Roy [name] presented to [facility] on 08/19/2024.  He was taken to the operating room and underwent Robotic Assisted Right lower lobectomy, lymph node dissection, and intercostal nerve block.  He tolerated the procedure, was extubated and taken to the PACU in stable condition.  The patient's chest xray showed a tiny pneumothorax.  He has some right sided chest wall subcutaneous emphysema.  His chest tube showed evidence of an air leak.  His foley catheter was removed without difficulty.  He has chronic pain and his home medication was resumed and his pain level was well controlled.  The patient's posterior  chest tube was clamped with stable appearance of chest xray.  This tube was removed and the pigtail catheter was left in place.  The patient's chest tube's air leak resolved on 08/22/2024.  His chest tube was able to be removed the same day.  Follow up CXR showed questionable basilar pneumothorax vs. Gaseous distention under the diaphragm.  Repeat CXR in the morning showed no evidence of basilar pneumothorax.  There was stable appearance of his apical space.  He has been unable to wean from oxygen but has been weaned down to 2L.  Home oxygen has been arranged.  The patient's pain was improved post chest tube removal.  His home medications have all been resumed.  The patient's surgical incisions are healing without evidence of infection.  He is stable for discharge home today.

## 2024-08-19 NOTE — Anesthesia Procedure Notes (Addendum)
 Arterial Line Insertion Start/End9/24/2025 7:00 AM, 08/19/2024 7:10 AM Performed by: Boyce Shilling, CRNA, CRNA  Patient location: Pre-op. Preanesthetic checklist: patient identified, IV checked, site marked, risks and benefits discussed, surgical consent, monitors and equipment checked, pre-op evaluation, timeout performed and anesthesia consent Lidocaine  1% used for infiltration Left, radial was placed Catheter size: 20 G Hand hygiene performed  and maximum sterile barriers used  Allen's test indicative of satisfactory collateral circulation Attempts: 1 Procedure performed without using ultrasound guided technique. Following insertion, dressing applied and Biopatch. Patient tolerated the procedure well with no immediate complications.

## 2024-08-19 NOTE — TOC CM/SW Note (Signed)
 Transition of Care [facility]) - Inpatient Brief Assessment   Patient Details  Name: ERRICK SALTS MRN: [id number] Date of Birth: May 22, 1946  [facility]) CM/SW Contact:    Lauraine FORBES Saa, LCSWA Phone Number: 08/19/2024, 2:34 PM   Clinical Narrative:  2:35 PM Per chart review, patient resides at home with spouse and child(ren). Patient has a PCP and insurance. Patient has SNF history with [facility]. Patient has CIR history with [facility]. Patient has HH with [facility] and [facility]. Patient has DME (wound vac, RW) history with [facility] and [facility]. Patient's preferred pharmacy's are [facility] [id number] [place] and [facility] [place]. No TOC needs identified at this time. TOC will continue to follow and be available to assist.  Transition of Care Asessment: Insurance and Status: Insurance coverage has been reviewed Patient has primary care physician: Yes Home environment has been reviewed: Private Residence Prior level of function:: [name] Prior/Current Home Services: No current home services Social Drivers of Health Review: SDOH reviewed no interventions necessary Readmission risk has been reviewed: Yes (Currently Green 19%) Transition of care needs: no transition of care needs at this time

## 2024-08-19 NOTE — Brief Op Note (Signed)
 08/19/2024  11:25 AM  PATIENT:  Quintin LITTIE Clause  78 y.o. male  PRE-OPERATIVE DIAGNOSIS:  RIGHT LOWER LOBE SQUAMOUS CELL CARCINOMA  POST-OPERATIVE DIAGNOSIS:  RIGHT LOWER LOBE SQUAMOUS CELL CARCINOMA  PROCEDURE:  RIGHT LOWER LOBECTOMY, ROBOT-ASSISTED, USING VATS  LYMPH NODE BIOPSY (Right) BLOCK, NERVE, INTERCOSTAL (Right)  SURGEON:  Surgeons and Role:    * Kerrin Elspeth BROCKS, MD - Primary  PHYSICIAN ASSISTANT: Con Bend PA-C  ASSISTANTS: none   ANESTHESIA:   local and general  EBL:  50 mL   BLOOD ADMINISTERED:none  DRAINS: right pleural pigtail drain and right pleural large bore chest tube   LOCAL MEDICATIONS USED:  OTHER Exparel   SPECIMEN:  Source of Specimen:  Right lower lobe, lymph nodes  DISPOSITION OF SPECIMEN:  PATHOLOGY  COUNTS:  YES  TOURNIQUET:  * No tourniquets in log *  DICTATION: .Dragon Dictation  PLAN OF CARE: Admit to inpatient   PATIENT DISPOSITION:  PACU - hemodynamically stable.   Delay start of Pharmacological VTE agent (>24hrs) due to surgical blood loss or risk of bleeding: no

## 2024-08-19 NOTE — Anesthesia Postprocedure Evaluation (Signed)
 Anesthesia Post Note  Patient: Roy [name]  Procedure(s) Performed: RIGHT LOWER LOBECTOMY, LUNG, ROBOT-ASSISTED, USING VATS (Right: Chest) LYMPH NODE BIOPSY (Right: Chest) BLOCK, NERVE, INTERCOSTAL (Right: Chest)     Patient location during evaluation: PACU Anesthesia Type: General Level of consciousness: awake Pain management: pain level controlled Vital Signs Assessment: post-procedure vital signs reviewed and stable Respiratory status: spontaneous breathing Cardiovascular status: blood pressure returned to baseline Anesthetic complications: no   No notable events documented.             Lauraine KATHEE [place]

## 2024-08-19 NOTE — Transfer of Care (Signed)
 Immediate Anesthesia Transfer of Care Note  Patient: Roy [name]  Procedure(s) Performed: RIGHT LOWER LOBECTOMY, LUNG, ROBOT-ASSISTED, USING VATS (Right: Chest) LYMPH NODE BIOPSY (Right: Chest) BLOCK, NERVE, INTERCOSTAL (Right: Chest)  Patient Location: PACU  Anesthesia Type:General  Level of Consciousness: awake and alert   Airway & Oxygen Therapy: Patient Spontanous Breathing and Patient connected to face mask oxygen  Post-op Assessment: Report given to RN and Post -op Vital signs reviewed and stable  Post vital signs: Reviewed and stable  Last Vitals:  Vitals Value Taken Time  BP 149/63 08/19/24 11:40  Temp    Pulse 60 08/19/24 11:43  Resp 16 08/19/24 11:43  SpO2 97 % 08/19/24 11:43  Vitals shown include unfiled device data.  Last Pain:  Vitals:   08/19/24 0603  TempSrc:   PainSc: 1       Patients Stated Pain Goal: 1 (08/19/24 0603)  Complications: No notable events documented.

## 2024-08-19 NOTE — Plan of Care (Signed)
  Problem: Education: Goal: Knowledge of General Education information will improve Description: Including pain rating scale, medication(s)/side effects and non-pharmacologic comfort measures Outcome: Progressing   Problem: Health Behavior/Discharge Planning: Goal: Ability to manage health-related needs will improve Outcome: Progressing   Problem: Clinical Measurements: Goal: Ability to maintain clinical measurements within normal limits will improve Outcome: Progressing Goal: Will remain free from infection Outcome: Progressing Goal: Diagnostic test results will improve Outcome: Progressing Goal: Respiratory complications will improve Outcome: Progressing Goal: Cardiovascular complication will be avoided Outcome: Progressing   Problem: Activity: Goal: Risk for activity intolerance will decrease Outcome: Progressing   Problem: Nutrition: Goal: Adequate nutrition will be maintained Outcome: Progressing   Problem: Coping: Goal: Level of anxiety will decrease Outcome: Progressing   Problem: Elimination: Goal: Will not experience complications related to bowel motility Outcome: Progressing Goal: Will not experience complications related to urinary retention Outcome: Progressing   Problem: Pain Managment: Goal: General experience of comfort will improve and/or be controlled Outcome: Progressing   Problem: Safety: Goal: Ability to remain free from injury will improve Outcome: Progressing   Problem: Skin Integrity: Goal: Risk for impaired skin integrity will decrease Outcome: Progressing   Problem: Education: Goal: Knowledge of disease or condition will improve Outcome: Progressing Goal: Knowledge of the prescribed therapeutic regimen will improve Outcome: Progressing   Problem: Activity: Goal: Risk for activity intolerance will decrease Outcome: Progressing   Problem: Cardiac: Goal: Will achieve and/or maintain hemodynamic stability Outcome: Progressing    Problem: Clinical Measurements: Goal: Postoperative complications will be avoided or minimized Outcome: Progressing   Problem: Respiratory: Goal: Respiratory status will improve Outcome: Progressing   Problem: Pain Management: Goal: Pain level will decrease Outcome: Progressing

## 2024-08-19 NOTE — Op Note (Signed)
 NAME: Roy [name], FENTER MEDICAL RECORD NO: [id number] ACCOUNT NO: [id number] DATE OF BIRTH: [birth year] FACILITY: [facility] LOCATION: [facility] PHYSICIAN: Elspeth BROCKS. Kerrin, MD  Operative Report   DATE OF PROCEDURE: 08/19/2024  PREOPERATIVE DIAGNOSIS:  Squamous cell carcinoma right lower lobe, clinical stage IB (T2a, N0).  POSTOPERATIVE DIAGNOSIS:  Squamous cell carcinoma right lower lobe, clinical stage IB (T2a, N0).  PROCEDURE:   Xi robotic-assisted right lower lobectomy,  Lymph node dissection, and  Intercostal nerve blocks levels 3 through 10.  SURGEON:  Elspeth BROCKS. Kerrin, MD.  ASSISTANT: Con Bend, PA.  ANESTHESIA:  General.  FINDINGS:  Incomplete fissure.  Enlarged, but otherwise benign-appearing lymph nodes.  Bronchial margin negative for tumor.  CLINICAL NOTE:  Roy [name] is a 78 year old man with a history of tobacco abuse, emphysema, as well as multiple other medical problems who was found to have a 3.1 cm spiculated right lower lobe mass on a low-dose CT for lung cancer screening. On PET CT, the nodule was hypermetabolic.  There were mildly hypermetabolic lymph nodes.  He underwent navigational bronchoscopy and endobronchial ultrasound.  The lymph node aspirations were negative for tumor.  Biopsies of the mass showed squamous cell carcinoma. He was offered the option of surgery versus stereotactic radiation.  The indications, risks, benefits, and alternatives were discussed in detail with the patient.  He understood the risks of surgery associated with his comorbidities.  He accepted them and wished to proceed.  OPERATIVE NOTE:  Roy [name] was brought to the operating room on 08/19/2024.  He had induction of general anesthesia and was intubated with a double-lumen endotracheal tube.  Intravenous antibiotics were administered.  A Foley catheter was placed.  Sequential compression devices were placed on the calves for DVT prophylaxis.  He was placed in a left lateral decubitus  position.  A Bair Hugger was placed for active warming.  The right chest was prepped and draped in the usual sterile fashion. Single-lung ventilation of the left lung was initiated and was tolerated well throughout the procedure.  Experienced assistance was necessary for this case due to surgical complexity.  Con Bend assisted with port placement, robot docking and undocking, instrument exchange, specimen retrieval, suctioning, and wound closure.  A timeout was performed.  A solution containing 20 mL of liposome bupivacaine , 30 mL of 0.5% bupivacaine , and 50 mL of saline was prepared.  The solution was used for local at the incision sites and for the intercostal nerve blocks.  An incision was made in the eighth interspace in the midaxillary line and an 8-mm robotic port was inserted.  The thoracoscope was advanced into the chest.  After confirming intrapleural placement, carbon dioxide was insufflated per protocol.  A 12-mm robotic port was  placed in the seventh interspace anterior to the camera port.  Intercostal nerve blocks then were performed from the third to the tenth interspace by injecting 10 mL of the bupivacaine  solution into a subpleural plane at each level. A 12-mm AirSeal port was placed in the tenth interspace posteriorly and then 2 additional eighth interspace robotic ports were placed.  The robot was deployed.  The camera arm was docked.  Targeting was performed.  The robotic instruments were inserted with thoracoscopic visualization.  There was good isolation of the right lung.  There were adhesions at the apex.  Those were not taken down.  The lower lobe was retracted superiorly.  The inferior pulmonary ligament was divided. Level 8 and level 9 nodes were removed.  Pleural reflection was divided  at the hilum posteriorly and there were multiple large but otherwise benign-appearing level 7 nodes.  They were 3 in total.  Those were removed.  There was some minor bleeding and Surgicel was  placed into the subcarinal space.  The pleura then was opened over the bifurcation of the upper lobe from the superior segment and a level 11 node was removed from that bronchial bifurcation. The pleural reflection was divided anteriorly.  The phrenic nerve was clearly visible. No cautery was used in its vicinity. The fissure was relatively complete between the middle and lower lobes. The pleura overlying the pulmonary artery was incised and a plane was developed.  A small incomplete area was stapled. The dissection then was carried from an anterior to posterior approach in the fissure.  There was incomplete fissure beyond the confluence of the fissures. The superior segmental arterial branch was identified.  The stapler was fired through the parenchyma superior to that and then the superior segmental artery was dissected out, encircled, and divided with a robotic stapler. Then, the basilar segmental trunk was encircled and divided with a robotic stapler.  A level 13 node was removed at the bifurcation.  Additional nodes were removed and then the inferior pulmonary vein was encircled and divided.  The division of the fissure was completed with multiple firings of the stapler.  Then, the stapler was placed across the origin of the right lower lobe bronchus and closed. The test inflation showed good aeration of the upper and middle lobes and the stapler was fired transecting the bronchus.  The vessel loop and sponges used during the dissection were removed. The chest was copiously irrigated with saline. Test inflation showed an air leak from the upper lobe staple line. This area was re-stapled in 2 places and then there was no significant air leak after that.  Progel was also applied to those areas.  The robotic instruments were removed.  The robot was undocked.  The seventh interspace incision was lengthened to approximately 3.5 cm. A 15-mm endoscopic retrieval bag was placed into the chest.  The lower lobe was  manipulated into the bag, removed, and sent for frozen section of the bronchial margin, which subsequently returned with no tumor seen.  The Progel was applied to the staple line.  A pigtail catheter was placed anterolaterally and secured to the skin with a #1 silk suture and a 28-French chest tube was placed through the original port incision and advanced to the apex and secured with #1 silk suture.  Dual-lung ventilation was resumed.  The incisions were closed in standard fashion. The chest tube was placed to a Pleur-evac on water  seal.  The patient was placed back in the supine position.  He was extubated in the operating room and taken to the postanesthetic care unit in good condition. All sponge, needle, and instrument counts were correct.   NIK D: 08/19/2024 1:05:55 pm T: 08/19/2024 11:54:00 pm  JOB: [id number]/ [id number]

## 2024-08-20 ENCOUNTER — Encounter (HOSPITAL_COMMUNITY): Payer: Self-pay | Admitting: Thoracic Surgery (Cardiothoracic Vascular Surgery)

## 2024-08-20 ENCOUNTER — Inpatient Hospital Stay (HOSPITAL_COMMUNITY)

## 2024-08-20 DIAGNOSIS — Z902 Acquired absence of lung [part of]: Secondary | ICD-10-CM

## 2024-08-20 DIAGNOSIS — J939 Pneumothorax, unspecified: Secondary | ICD-10-CM | POA: Diagnosis not present

## 2024-08-20 DIAGNOSIS — Z48813 Encounter for surgical aftercare following surgery on the respiratory system: Secondary | ICD-10-CM | POA: Diagnosis not present

## 2024-08-20 DIAGNOSIS — Z96611 Presence of right artificial shoulder joint: Secondary | ICD-10-CM | POA: Diagnosis not present

## 2024-08-20 DIAGNOSIS — J9811 Atelectasis: Secondary | ICD-10-CM | POA: Diagnosis not present

## 2024-08-20 LAB — CBC
HCT: 33.4 % — ABNORMAL LOW (ref 39.0–52.0)
Hemoglobin: 11.2 g/dL — ABNORMAL LOW (ref 13.0–17.0)
MCH: 30.7 pg (ref 26.0–34.0)
MCHC: 33.5 g/dL (ref 30.0–36.0)
MCV: 91.5 fL (ref 80.0–100.0)
Platelets: 205 K/uL (ref 150–400)
RBC: 3.65 MIL/uL — ABNORMAL LOW (ref 4.22–5.81)
RDW: 12.5 % (ref 11.5–15.5)
WBC: 10.3 K/uL (ref 4.0–10.5)
nRBC: 0 % (ref 0.0–0.2)

## 2024-08-20 LAB — BASIC METABOLIC PANEL WITH GFR
Anion gap: 9 (ref 5–15)
BUN: 14 mg/dL (ref 8–23)
CO2: 25 mmol/L (ref 22–32)
Calcium: 8.5 mg/dL — ABNORMAL LOW (ref 8.9–10.3)
Chloride: 102 mmol/L (ref 98–111)
Creatinine, Ser: 0.91 mg/dL (ref 0.61–1.24)
GFR, Estimated: >60 mL/min (ref >60)
Glucose, Bld: 121 mg/dL — ABNORMAL HIGH (ref 70–99)
Potassium: 4.8 mmol/L (ref 3.5–5.1)
Sodium: 136 mmol/L (ref 135–145)

## 2024-08-20 NOTE — Discharge Summary (Addendum)
 [address] [place] [address]             [phone]        Physician Discharge Summary  Patient ID: Roy [name] MRN: [id number] DOB/AGE: 1946-07-02 78 y.o.  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,N0)  Patient Active Problem List   Diagnosis Date Noted   Lung nodules 06/23/2024   Colon cancer screening 06/18/2024   Cirrhosis of liver (HCC) 12/11/2021   Coronary arteriosclerosis 12/11/2021   Infected surgical wound 04/06/2020   Peripheral vascular disease 03/31/2020   Claudication in peripheral vascular disease 03/03/2020   Complication of surgical procedure 09/03/2017   History of alcohol abuse 08/19/2017   PAD (peripheral artery disease) 08/16/2017   Benign neoplasm of soft tissues of left upper extremity 04/30/2016   Closed displaced fracture of neck of fourth metacarpal bone of right hand 02/17/2016   Right hand pain 02/17/2016   Entrapment of left ulnar nerve 01/13/2016   Heberden's node 12/19/2015   Primary osteoarthritis of first carpometacarpal joint of left hand 12/19/2015   ICD (implantable cardioverter-defibrillator), biventricular, in situ 12/01/2015   Cardiomyopathy, ischemic 08/20/2015   Normocytic anemia 10/14/2014   Weight gain 06/29/2014   Hypokalemia 06/29/2014   Chronic systolic heart failure (HCC) 06/15/2014   COPD exacerbation (HCC) 06/15/2014   Hypernatremia 06/15/2014   Acute renal failure 06/15/2014   Encephalopathy 06/15/2014   Loose stools 06/15/2014   Respiratory failure (HCC) 06/03/2014   Acute respiratory failure (HCC) 06/03/2014   Pneumonia, organism unspecified(486) 06/03/2014   Elevated troponin 06/03/2014   NSTEMI (non-ST elevated myocardial infarction) (HCC) 06/03/2014   Acute on chronic diastolic heart failure (HCC) 06/03/2014   Peripheral vascular disease, unspecified 04/26/2014   Melena  03/03/2014   Cirrhosis, alcoholic (HCC) 03/03/2014   Alcohol withdrawal delirium (HCC) 10/09/2012   Colon adenomas 06/18/2012   Degenerative joint disease    LBBB (left bundle branch block)    Tobacco abuse    Alcohol dependence (HCC)    Thrombocytopenia    Hypertensive heart disease    Anxiety and depression    Hyperlipidemia 10/03/2010   CEREBROVASCULAR DISEASE 10/03/2010   CHRONIC OBSTRUCTIVE PULMONARY DISEASE 10/03/2010   DEGENERATIVE JOINT DISEASE, SHOULDER 10/02/2007   Discharge Diagnoses: 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- Clinical and pathologic stage IB(T2a,N0)  Patient Active Problem List   Diagnosis Date Noted   Status post robot-assisted surgical procedure 08/19/2024   S/P lobectomy of lung 08/19/2024   Lung nodules 06/23/2024   Colon cancer screening 06/18/2024   Cirrhosis of liver (HCC) 12/11/2021   Coronary arteriosclerosis 12/11/2021   Infected surgical wound 04/06/2020   Peripheral vascular disease 03/31/2020   Claudication in peripheral vascular disease 03/03/2020   Complication of surgical procedure 09/03/2017   History of alcohol abuse 08/19/2017   PAD (peripheral artery disease) 08/16/2017   Benign neoplasm of soft tissues of left upper extremity 04/30/2016   Closed displaced fracture of neck of fourth metacarpal bone of right hand 02/17/2016   Right hand pain 02/17/2016   Entrapment of left ulnar nerve 01/13/2016   Heberden's node 12/19/2015   Primary osteoarthritis of first carpometacarpal joint of left hand 12/19/2015   ICD (implantable cardioverter-defibrillator), biventricular, in situ 12/01/2015   Cardiomyopathy, ischemic 08/20/2015   Normocytic anemia 10/14/2014   Weight gain 06/29/2014   Hypokalemia 06/29/2014   Chronic systolic  heart failure (HCC) 06/15/2014   COPD exacerbation (HCC) 06/15/2014   Hypernatremia 06/15/2014   Acute renal failure 06/15/2014   Encephalopathy 06/15/2014   Loose stools 06/15/2014   Respiratory  failure (HCC) 06/03/2014   Acute respiratory failure (HCC) 06/03/2014   Pneumonia, organism unspecified(486) 06/03/2014   Elevated troponin 06/03/2014   NSTEMI (non-ST elevated myocardial infarction) (HCC) 06/03/2014   Acute on chronic diastolic heart failure (HCC) 06/03/2014   Peripheral vascular disease, unspecified 04/26/2014   Melena 03/03/2014   Cirrhosis, alcoholic (HCC) 03/03/2014   Alcohol withdrawal delirium (HCC) 10/09/2012   Colon adenomas 06/18/2012   Degenerative joint disease    LBBB (left bundle branch block)    Tobacco abuse    Alcohol dependence (HCC)    Thrombocytopenia    Hypertensive heart disease    Anxiety and depression    Hyperlipidemia 10/03/2010   CEREBROVASCULAR DISEASE 10/03/2010   CHRONIC OBSTRUCTIVE PULMONARY DISEASE 10/03/2010   DEGENERATIVE JOINT DISEASE, SHOULDER 10/02/2007   Discharged Condition: good  History of Present Illness:  Roy [name] is a 78 year old man with a history of tobacco abuse, emphysema, MI, left bundle branch block, congestive heart failure, ICD, ethanol abuse and withdrawal, cirrhosis, PAD, hypertension, hyperlipidemia, and thrombocytopenia. He was found to have a 3.1 cm spiculated right lower lobe mass on a low-dose CT for lung cancer screening in May. There were multiple additional nodules scattered bilaterally.  He was referred for Thoracic surgery evaluation and was evaluated by Dr. Kerrin.  At that time the patient denied chest pain, pressure, tightness, or shortness of breath with exertion. Feels short of breath when he first lies flat on his back but that goes away after about a minute. No change in appetite or weight loss.  PET CT was reviewed and revealed the lesion to be hypermetabolic.  The patient was offered radiation vs. Surgery.  The patient wanted to have a more definitive diagnosis prior to making decision.  He underwent Robotic Bronchoscopy and endobronchial ultrasound on 07/30/2024.  Pathology from that  procedure showed squamous cell carcinoma.  Due to this the patient decided to proceed with Robotic Right Lower Lobectomy.  The risks and benefits of the procedure were explained to the patient and he was agreeable to proceed.   Hospital Course:  Roy [name] presented to [facility] on 08/19/2024.  He was taken to the operating room and underwent Robotic Assisted Right lower lobectomy, lymph node dissection, and intercostal nerve block.  He tolerated the procedure, was extubated and taken to the PACU in stable condition.  The patient's chest xray showed a tiny pneumothorax.  He has some right sided chest wall subcutaneous emphysema.  His chest tube showed evidence of an air leak.  His foley catheter was removed without difficulty.  He has chronic pain and his home medication was resumed and his pain level was well controlled.  The patient's posterior chest tube was clamped with stable appearance of chest xray.  This tube was removed and the pigtail catheter was left in place.  The patient's chest tube's air leak resolved on 08/22/2024.  His chest tube was able to be removed the same day.  Follow up CXR showed questionable basilar pneumothorax vs. Gaseous distention under the diaphragm.  Repeat CXR in the morning showed no evidence of basilar pneumothorax.  There was stable appearance of his apical space.  He has been unable to wean from oxygen but has been weaned down to 2L.  Home oxygen has been arranged.  The patient's pain  was improved post chest tube removal.  His home medications have all been resumed.  The patient's surgical incisions are healing without evidence of infection.  He is stable for discharge home today.  Consults: None  Significant Diagnostic Studies: nuclear medicine:   FINDINGS: Mediastinal blood pool activity: SUV max 2.1   NECK:   No hypermetabolic cervical lymph nodes are identified. No suspicious activity identified within the pharyngeal mucosal space.   Incidental  CT findings: Severe bilateral carotid atherosclerosis.   CHEST:   There are no hypermetabolic mediastinal, hilar or axillary lymph nodes. There is marked hypermetabolic activity within the previously demonstrated spiculated right lower lobe mass. This mass measures 3.4 x 2.9 cm on image 50/7 and has an SUV max of 16.2. Additional scattered pulmonary nodules are grossly stable, without additional hypermetabolic activity.   Incidental CT findings: Left subclavian pacemaker. Diffuse atherosclerosis of the aorta, great vessels and coronary arteries. Moderate centrilobular and paraseptal emphysema.   ABDOMEN/PELVIS:   There is no hypermetabolic activity within the liver, adrenal glands, spleen or pancreas. There is no hypermetabolic nodal activity in the abdomen or pelvis.   Incidental CT findings: Diffuse aortic and branch vessel atherosclerosis. Bilateral iliac and right femoral arterial stents.   SKELETON:   There is no hypermetabolic activity to suggest osseous metastatic disease.   Incidental CT findings: Right shoulder arthroplasty. Mild multilevel spondylosis.   IMPRESSION: 1. The previously demonstrated spiculated right lower lobe mass is hypermetabolic, consistent with primary bronchogenic carcinoma. 2. No evidence of metastatic disease. Additional scattered pulmonary nodules are grossly stable, without hypermetabolic activity. Although likely benign, these are suboptimally evaluated based on size. Attention on follow-up recommended. 3. Aortic Atherosclerosis (ICD10-I70.0) and Emphysema (ICD10-J43.9).     Electronically Signed   By: Elsie Perone M.D.   On: 07/02/2024 14:27  Treatments: surgery:  NAME: WING, SCHOCH MEDICAL RECORD NO: [id number] ACCOUNT NO: [id number] DATE OF BIRTH: August 30, 1946 FACILITY: [facility] LOCATION: [facility] PHYSICIAN: Elspeth BROCKS. Kerrin, MD   Operative Report    DATE OF PROCEDURE: 08/19/2024   PREOPERATIVE DIAGNOSIS:  Squamous cell  carcinoma right lower lobe, clinical stage IB (T2a, N0).   POSTOPERATIVE DIAGNOSIS:  Squamous cell carcinoma right lower lobe, clinical stage IB (T2a, N0).   PROCEDURE:  Xi robotic-assisted right lower lobectomy, lymph node dissection, and intercostal nerve blocks levels 3 through 10.   SURGEON:  Elspeth BROCKS. Kerrin, MD.   ASSISTANT: Con Bend, PA.   PATHOLOGY: Final Remains Pending, known Squamous Cell Carcinoma from Bronchoscopic Biopsy  Discharge Exam: Blood pressure 93/60, pulse 69, temperature 97.7 F (36.5 C), temperature source Oral, resp. rate 16, height 5' 3 (1.6 m), weight 70.4 kg, SpO2 97%.  General appearance: alert, cooperative, and no distress Heart: regular rate and rhythm Lungs: clear to auscultation bilaterally Abdomen: soft, non-tender; bowel sounds normal; no masses,  no organomegaly Extremities: extremities normal, atraumatic, no cyanosis or edema Wound: clean and dry  Disposition:    Allergies as of 08/23/2024   No Known Allergies      Medication List     TAKE these medications    aspirin  EC 81 MG tablet Take 81 mg by mouth every evening.   atorvastatin  20 MG tablet Commonly known as: LIPITOR  Take 20 mg by mouth every evening.   Brilinta  90 MG Tabs tablet Generic drug: ticagrelor  Take 90 mg by mouth 2 (two) times daily.   carvedilol  6.25 MG tablet Commonly known as: COREG  TAKE 1 TABLET TWICE A DAY WITH MEALS (DOSE CHANGE, NEW PRESCRIPTION)  CENTRUM SILVER PO Take 1 tablet by mouth daily. 50+   cetirizine 10 MG tablet Commonly known as: ZYRTEC Take 10 mg by mouth daily.   CLEAR EYES OP Place 1 drop into both eyes daily as needed (dry eyes).   clotrimazole -betamethasone  cream Commonly known as: LOTRISONE  Apply 1 Application topically 2 (two) times daily.   dextromethorphan -guaiFENesin  30-600 MG 12hr tablet Commonly known as: MUCINEX  DM Take 1 tablet by mouth 2 (two) times daily.   Fish Oil  1000 MG Caps Take 1,000 mg by  mouth every evening.   gabapentin  300 MG capsule Commonly known as: NEURONTIN  Take 300 mg by mouth 2 (two) times daily.   Gold Bond Psoriasis Relief 3 % Crea Generic drug: Salicylic Acid Apply 1 application topically daily as needed (psoriasis).   HYDROcodone -acetaminophen  5-325 MG tablet Commonly known as: NORCO/VICODIN Take 1-2 tablets by mouth See admin instructions. Take 2 tablets in the morning and evening, may take 1 tablet at lunch as needed for pain   HYDROcodone -acetaminophen  10-325 MG tablet Commonly known as: NORCO Take 1 tablet by mouth at bedtime.   Magnesium  250 MG Tabs Take 250 mg by mouth every evening.   Neuriva Plus Caps Take 1 capsule by mouth daily.   sacubitril -valsartan  97-103 MG Commonly known as: Entresto  Take 1 tablet by mouth 2 (two) times daily.   silodosin 8 MG Caps capsule Commonly known as: RAPAFLO Take 8 mg by mouth daily with breakfast.   SUPER B COMPLEX/C PO Take 1 tablet by mouth every evening.   tamsulosin  0.4 MG Caps capsule Commonly known as: FLOMAX  Take 0.4 mg by mouth daily after supper.   traMADol  50 MG tablet Commonly known as: ULTRAM  Take 50 mg by mouth 2 (two) times daily as needed for moderate pain (pain score 4-6).               Durable Medical Equipment  (From admission, onward)           Start     Ordered   08/23/24 0730  For home use only DME oxygen  Once       Question Answer Comment  Length of Need 6 Months   Mode or (Route) Nasal cannula   Liters per Minute 2   Frequency Continuous (stationary and portable oxygen unit needed)   Oxygen conserving device Yes   Oxygen delivery system Gas      08/23/24 0729            Follow-up Information     Kerrin Elspeth BROCKS, MD Follow up on 09/01/2024.   Specialty: Cardiothoracic Surgery Why: Appointment is at 2:00, please get CXR at 1:00 on 2nd floor of our office building prior to your appointment with Dr. Kerrin Pass information: [address] [place] [address] [phone]                 Signed: Rocky Shad, PA-C  08/23/2024, 9:46 AM

## 2024-08-20 NOTE — Plan of Care (Signed)
  Problem: Education: Goal: Knowledge of General Education information will improve Description: Including pain rating scale, medication(s)/side effects and non-pharmacologic comfort measures 08/20/2024 1850 by Blinda Georges BIRCH, RN Outcome: Progressing 08/20/2024 1001 by Blinda Georges BIRCH, RN Outcome: Progressing   Problem: Health Behavior/Discharge Planning: Goal: Ability to manage health-related needs will improve 08/20/2024 1850 by Blinda Georges BIRCH, RN Outcome: Progressing 08/20/2024 1001 by Blinda Georges BIRCH, RN Outcome: Progressing   Problem: Clinical Measurements: Goal: Ability to maintain clinical measurements within normal limits will improve 08/20/2024 1850 by Blinda Georges BIRCH, RN Outcome: Progressing 08/20/2024 1001 by Blinda Georges BIRCH, RN Outcome: Progressing Goal: Will remain free from infection Outcome: Progressing Goal: Respiratory complications will improve Outcome: Progressing Goal: Cardiovascular complication will be avoided 08/20/2024 1850 by Blinda Georges BIRCH, RN Outcome: Progressing 08/20/2024 1001 by Blinda Georges BIRCH, RN Outcome: Progressing   Problem: Activity: Goal: Risk for activity intolerance will decrease Outcome: Progressing   Problem: Elimination: Goal: Will not experience complications related to bowel motility Outcome: Progressing Goal: Will not experience complications related to urinary retention Outcome: Progressing   Problem: Pain Managment: Goal: General experience of comfort will improve and/or be controlled 08/20/2024 1850 by Blinda Georges BIRCH, RN Outcome: Progressing 08/20/2024 1001 by Blinda Georges BIRCH, RN Outcome: Progressing

## 2024-08-20 NOTE — Progress Notes (Signed)
 Mobility Specialist Progress Note;   08/20/24 1100  Mobility  Activity Ambulated with assistance  Level of Assistance Standby assist, set-up cues, supervision of patient - no hands on  Assistive Device Front wheel walker  Distance Ambulated (ft) 400 ft  Activity Response Tolerated well  Mobility Referral Yes  Mobility visit 1 Mobility  Mobility Specialist Start Time (ACUTE ONLY) 1100  Mobility Specialist Stop Time (ACUTE ONLY) 1113  Mobility Specialist Time Calculation (min) (ACUTE ONLY) 13 min   Pt eager for mobility. On 2LO2 upon arrival. Required no physical assistance during ambulation, however VC to maintain safety while ambulating. Ambulated on 2LO2, VSS when able to receive accurate pleth. No c/o when asked. Pt returned back to bed and left with all needs met, call bell in reach.   Lauraine Erm Mobility Specialist Please contact via SecureChat or [employer] [phone]

## 2024-08-20 NOTE — Progress Notes (Addendum)
 [address] [place] [address]             [phone]      1 Day Post-Op Procedure(s) (LRB): RIGHT LOWER LOBECTOMY, LUNG, ROBOT-ASSISTED, USING VATS (Right) LYMPH NODE BIOPSY (Right) BLOCK, NERVE, INTERCOSTAL (Right) Subjective: Patient reports his pain is controlled this morning he just feels a little tight when taking a deep breath  Objective: Vital signs in last 24 hours: Temp:  [97.5 F (36.4 C)-98.5 F (36.9 C)] 98.4 F (36.9 C) (09/25 0716) Pulse Rate:  [57-67] 65 (09/25 0716) Cardiac Rhythm: Ventricular paced (09/25 0703) Resp:  [10-17] 16 (09/25 0716) BP: (115-159)/(49-77) 142/73 (09/25 0716) SpO2:  [92 %-99 %] 95 % (09/25 0716) Arterial Line BP: (163-184)/(53-67) 182/53 (09/24 1300) Weight:  [70.4 kg] 70.4 kg (09/24 1332)  Hemodynamic parameters for last 24 hours:    Intake/Output from previous day: 09/24 0701 - 09/25 0700 In: 1450 [I.V.:1000; IV Piggyback:450] Out: 2466 [Urine:2150; Blood:50; Chest Tube:266] Intake/Output this shift: No intake/output data recorded.  General appearance: alert, cooperative, and no distress Neurologic: intact Heart: V paced, no murmur Lungs: diminished bibasilar breath sounds Abdomen: soft, non-tender; bowel sounds normal; no masses,  no organomegaly Extremities: extremities normal, atraumatic, no cyanosis or edema Wound: Posterior incision closest to the chest tube with superficial dehiscence due to tape placed over incision, serosanguinous drainage from incision and around large bore chest tube  Lab Results: Recent Labs    08/20/24 0205  WBC 10.3  HGB 11.2*  HCT 33.4*  PLT 205   BMET:  Recent Labs    08/20/24 0205  NA 136  K 4.8  CL 102  CO2 25  GLUCOSE 121*  BUN 14  CREATININE 0.91  CALCIUM  8.5*    PT/INR: No results for input(s): LABPROT, INR in the last 72 hours. ABG    Component Value Date/Time   PHART 7.400 03/31/2020 1101   HCO3 24.1 03/31/2020 1101   TCO2 26  02/19/2023 0606   ACIDBASEDEF 1.0 03/31/2020 1101   O2SAT 100.0 03/31/2020 1101   CBG (last 3)  No results for input(s): GLUCAP in the last 72 hours.  Assessment/Plan: S/P Procedure(s) (LRB): RIGHT LOWER LOBECTOMY, LUNG, ROBOT-ASSISTED, USING VATS (Right) LYMPH NODE BIOPSY (Right) BLOCK, NERVE, INTERCOSTAL (Right)  Neuro: Chronic pain, on Norco 10-325mg  TID-QID, Tramadol  50mg  BID PRN and Gabapentin  300mg  BID. Home meds were restarted and Morphine  was started for breakthrough pain but has not needed any Tramadol  or Morphine .   CV: On ASA 81mg  and Brilinta  90mg  BID at home for PAD. Will restart ASA 81mg  today. Will hold Brilinta  for now, will likely restart when chest tubes are removed. Some hypertension, SBP mostly 130s-150s. Home Coreg  6.25mg  BID and Entresto  97-103mg  BID have been restarted. PPM in place, V paced. NSR, HR 60s.   Pulm: Saturating well on 2L [place]. CT output 266cc/24hrs. CXR with trace right apical pneumothorax, slightly increased chest wall subcutaneous emphysema. + medium air leak. Large bore chest tube and pigtail to waterseal. Encourage IS and ambulation.   GI: +BM, tolerating diet  Renal: Cr 0.91, stable. UO 2150cc/24hrs. +10lbs from preop? Likely incorrect, will monitor. No edema on exam and no pulmonary congestion on CXR. IV fluids have been d/c'd. Hx of urinary retention. On home flomax . Will d/c foley.   ID: No leukocytosis, afebrile.   Expected postop ABLA: H/H 11.2/33.4, not clinically significant at this time.  DVT Prophylaxis: Lovenox   Wound: Posterior incision closest  to the chest tube with superficial dehiscence due to tape being placed over the site. Some serosanguinous drainage from the site. This was cleaned and dermabond was placed. Chest tube with saturated dressing due to serosanguinous drainage. New dressing was placed, no active frank bleeding.   Dispo: Continue chest tube to waterseal today. Ambulate.    LOS: 1 day    Con GORMAN Bend,  PA-C 08/20/2024   Chart reviewed, patient examined, agree with above.  He feels ok. Pain under control. CXR ok. There is a tiny right apical ptx and small amount of subcutaneous air laterally. 1-2+ air leak with breathing which I can hear listening to him. Will continue both tubes to water  seal and repeat CXR in am.

## 2024-08-20 NOTE — Progress Notes (Signed)
   08/20/24 1100  Spiritual Encounters  Type of Visit Initial  Care provided to: Patient  Referral source Patient request  Reason for visit Advance directives  OnCall Visit No  Interventions  Spiritual Care Interventions Made Established relationship of care and support;Reflective listening;Compassionate presence;Encouragement  Intervention Outcomes  Outcomes Awareness of support   Chaplain responded to Spiritual Consult for Advance Care Directive.  Chaplain arrived in room and delivered ACD education. Instructed Pt how to reach out to Spiritual Care if they have any questions and to contact us  when they are ready to move forward.  Chaplain services remain available as the need arises by Spiritual Consult or for emergent cases, paging [phone]

## 2024-08-20 NOTE — Plan of Care (Signed)
  Problem: Education: Goal: Knowledge of General Education information will improve Description: Including pain rating scale, medication(s)/side effects and non-pharmacologic comfort measures Outcome: Progressing   Problem: Health Behavior/Discharge Planning: Goal: Ability to manage health-related needs will improve Outcome: Progressing   Problem: Clinical Measurements: Goal: Ability to maintain clinical measurements within normal limits will improve Outcome: Progressing Goal: Respiratory complications will improve Outcome: Progressing Goal: Cardiovascular complication will be avoided Outcome: Progressing   Problem: Nutrition: Goal: Adequate nutrition will be maintained Outcome: Progressing   Problem: Pain Managment: Goal: General experience of comfort will improve and/or be controlled Outcome: Progressing

## 2024-08-21 ENCOUNTER — Inpatient Hospital Stay (HOSPITAL_COMMUNITY)

## 2024-08-21 LAB — COMPREHENSIVE METABOLIC PANEL WITH GFR
ALT: 15 U/L (ref 0–44)
AST: 31 U/L (ref 15–41)
Albumin: 3.4 g/dL — ABNORMAL LOW (ref 3.5–5.0)
Alkaline Phosphatase: 86 U/L (ref 38–126)
Anion gap: 10 (ref 5–15)
BUN: 11 mg/dL (ref 8–23)
CO2: 25 mmol/L (ref 22–32)
Calcium: 8.8 mg/dL — ABNORMAL LOW (ref 8.9–10.3)
Chloride: 97 mmol/L — ABNORMAL LOW (ref 98–111)
Creatinine, Ser: 0.73 mg/dL (ref 0.61–1.24)
GFR, Estimated: >60 mL/min (ref >60)
Glucose, Bld: 132 mg/dL — ABNORMAL HIGH (ref 70–99)
Potassium: 4.6 mmol/L (ref 3.5–5.1)
Sodium: 132 mmol/L — ABNORMAL LOW (ref 135–145)
Total Bilirubin: 0.9 mg/dL (ref 0.0–1.2)
Total Protein: 6.9 g/dL (ref 6.5–8.1)

## 2024-08-21 LAB — CBC
HCT: 38.4 % — ABNORMAL LOW (ref 39.0–52.0)
Hemoglobin: 13.1 g/dL (ref 13.0–17.0)
MCH: 30.8 pg (ref 26.0–34.0)
MCHC: 34.1 g/dL (ref 30.0–36.0)
MCV: 90.4 fL (ref 80.0–100.0)
Platelets: 236 K/uL (ref 150–400)
RBC: 4.25 MIL/uL (ref 4.22–5.81)
RDW: 12.9 % (ref 11.5–15.5)
WBC: 13.7 K/uL — ABNORMAL HIGH (ref 4.0–10.5)
nRBC: 0 % (ref 0.0–0.2)

## 2024-08-21 LAB — TYPE AND SCREEN
ABO/RH(D): O POS
Antibody Screen: NEGATIVE
Unit division: 0
Unit division: 0

## 2024-08-21 LAB — BPAM RBC
Blood Product Expiration Date: 202510252359
Blood Product Expiration Date: 202510252359
Unit Type and Rh: 5100
Unit Type and Rh: 5100

## 2024-08-21 MED ORDER — TRAMADOL HCL 50 MG PO TABS
100.0000 mg | ORAL_TABLET | Freq: Every day | ORAL | Status: DC
  Administered 2024-08-22 – 2024-08-23 (×2): 100 mg via ORAL
  Filled 2024-08-21 (×2): qty 2

## 2024-08-21 MED ORDER — TRAMADOL HCL 50 MG PO TABS
50.0000 mg | ORAL_TABLET | Freq: Once | ORAL | Status: AC
  Administered 2024-08-21: 50 mg via ORAL
  Filled 2024-08-21: qty 1

## 2024-08-21 MED ORDER — HYDROCODONE-ACETAMINOPHEN 5-325 MG PO TABS
1.0000 | ORAL_TABLET | ORAL | Status: DC | PRN
  Administered 2024-08-21 – 2024-08-23 (×5): 1 via ORAL
  Filled 2024-08-21 (×5): qty 1

## 2024-08-21 MED ORDER — HYDROCODONE-ACETAMINOPHEN 5-325 MG PO TABS: 1.0000 | ORAL_TABLET | Freq: Every day | ORAL | Status: DC | PRN

## 2024-08-21 NOTE — Plan of Care (Signed)
  Problem: Education: Goal: Knowledge of General Education information will improve Description: Including pain rating scale, medication(s)/side effects and non-pharmacologic comfort measures Outcome: Progressing   Problem: Health Behavior/Discharge Planning: Goal: Ability to manage health-related needs will improve Outcome: Progressing   Problem: Clinical Measurements: Goal: Ability to maintain clinical measurements within normal limits will improve Outcome: Progressing Goal: Respiratory complications will improve Outcome: Progressing Goal: Cardiovascular complication will be avoided Outcome: Progressing   Problem: Activity: Goal: Risk for activity intolerance will decrease Outcome: Progressing   Problem: Nutrition: Goal: Adequate nutrition will be maintained Outcome: Progressing   Problem: Elimination: Goal: Will not experience complications related to bowel motility Outcome: Progressing Goal: Will not experience complications related to urinary retention Outcome: Progressing   Problem: Safety: Goal: Ability to remain free from injury will improve Outcome: Progressing   Problem: Skin Integrity: Goal: Risk for impaired skin integrity will decrease Outcome: Progressing

## 2024-08-21 NOTE — Progress Notes (Addendum)
      [address] [place] [employer] [address]             [phone]         2 Days Post-Op Procedure(s) (LRB): RIGHT LOWER LOBECTOMY, LUNG, ROBOT-ASSISTED, USING VATS (Right) LYMPH NODE BIOPSY (Right) BLOCK, NERVE, INTERCOSTAL (Right)  Subjective:  Patient complains of pain.  States his medication regimen is different from how he uses it at home.  Denies N/V.  He has moved his bowels.  Objective: Vital signs in last 24 hours: Temp:  [98.1 F (36.7 C)-98.4 F (36.9 C)] 98.4 F (36.9 C) (09/26 0730) Pulse Rate:  [60-70] 69 (09/26 0730) Cardiac Rhythm: Ventricular paced (09/26 0702) Resp:  [17-20] 17 (09/26 0730) BP: (132-157)/(53-69) 152/68 (09/26 0730) SpO2:  [91 %-99 %] 94 % (09/26 0730)  Intake/Output from previous day: 09/25 0701 - 09/26 0700 In: 600 [P.O.:600] Out: 1860 [Urine:1650; Chest Tube:210] Intake/Output this shift: Total I/O In: 120 [P.O.:120] Out: -   General appearance: alert, cooperative, and no distress Heart: V. Paced Lungs: clear to auscultation bilaterally Abdomen: soft, non-tender; bowel sounds normal; no masses,  no organomegaly Extremities: extremities normal, atraumatic, no cyanosis or edema Wound: clean and ddry  Lab Results: Recent Labs    08/20/24 0205 08/21/24 0223  WBC 10.3 13.7*  HGB 11.2* 13.1  HCT 33.4* 38.4*  PLT 205 236   BMET:  Recent Labs    08/20/24 0205 08/21/24 0223  NA 136 132*  K 4.8 4.6  CL 102 97*  CO2 25 25  GLUCOSE 121* 132*  BUN 14 11  CREATININE 0.91 0.73  CALCIUM  8.5* 8.8*    PT/INR: No results for input(s): LABPROT, INR in the last 72 hours. ABG    Component Value Date/Time   PHART 7.400 03/31/2020 1101   HCO3 24.1 03/31/2020 1101   TCO2 26 02/19/2023 0606   ACIDBASEDEF 1.0 03/31/2020 1101   O2SAT 100.0 03/31/2020 1101   CBG (last 3)  No results for input(s): GLUCAP in the last 72 hours.  Assessment/Plan: S/P Procedure(s) (LRB): RIGHT LOWER LOBECTOMY, LUNG,  ROBOT-ASSISTED, USING VATS (Right) LYMPH NODE BIOPSY (Right) BLOCK, NERVE, INTERCOSTAL (Right)  CV- NSR, BP is stable Pulm- CT on water  seal, intermittent air leak, CXR w/o pneumothorax, + sub q emphysema. Chronic Pain- medication regimen adjusted to home use PAD- on ASA, will plan to resume Brilinta  once chest tube is removed ID- mild leukocytosis, likely SIRS/atelectasis DVT prophylaxis continue Lovenox  Dispo- patient stable, CT on water  seal with intermittent air leak, pigtail also in place, pain regimen adjusted to home regimen, continue current care   LOS: 2 days    Rocky Shad, PA-C 08/21/2024 7:39 AM  Resting comfortably when I walked into the room. Complains still of not being able to get comfortable.  Has a chest tube and pigtail together with a small air leak, stable CXR.  Will clamp chest tube and re-evaluate in 3 hours to see if pigtail is sufficient. If so, will plan to remove chest tube and keep pigtail.  Con Clunes, MD Cardiothoracic Surgery Pager: [phone]    [address] [place] [address]             [phone]

## 2024-08-21 NOTE — Progress Notes (Signed)
 Mobility Specialist Progress Note;   08/21/24 1032  Mobility  Activity Ambulated with assistance  Level of Assistance Standby assist, set-up cues, supervision of patient - no hands on  Assistive Device Front wheel walker  Distance Ambulated (ft) 400 ft  Activity Response Tolerated well  Mobility Referral Yes  Mobility visit 1 Mobility  Mobility Specialist Start Time (ACUTE ONLY) 1032  Mobility Specialist Stop Time (ACUTE ONLY) 1041  Mobility Specialist Time Calculation (min) (ACUTE ONLY) 9 min   Pt agreeable to mobility. On 3LO2 upon arrival. Required no physical assistance during ambulation, SV for safety. Frequent VC required for RW proximity and safety. Ambulated on 3LO2, SPO2 92%>. C/o severe pain at chest tube site requesting pain meds, RN notified. Pt returned back to bed with all needs met, call bell in reach.   Lauraine Erm Mobility Specialist Please contact via SecureChat or [employer] [phone]

## 2024-08-22 ENCOUNTER — Inpatient Hospital Stay (HOSPITAL_COMMUNITY)

## 2024-08-22 NOTE — Progress Notes (Signed)
 Mobility Specialist: Progress Note   08/22/24 1400  Mobility  Activity Ambulated independently  Level of Assistance Standby assist, set-up cues, supervision of patient - no hands on  Assistive Device Front wheel walker  Distance Ambulated (ft) 400 ft  Activity Response Tolerated well  Mobility Referral Yes  Mobility visit 1 Mobility  Mobility Specialist Start Time (ACUTE ONLY) 1130  Mobility Specialist Stop Time (ACUTE ONLY) 1140  Mobility Specialist Time Calculation (min) (ACUTE ONLY) 10 min    Pt received in bed, agreeable to mobility session. SV throughout. No complaints. Returned to room without fault. SpO2 92% on 3LO2 immediately after ambulation. Left in bed with all needs met, call bell in reach.   Ileana Lute Mobility Specialist Please contact via SecureChat or Rehab office at [phone]

## 2024-08-22 NOTE — Progress Notes (Signed)
 SATURATION QUALIFICATIONS: (This note is used to comply with regulatory documentation for home oxygen)  Patient Saturations on Room Air at Rest = 87%  Patient Saturations on Room Air while Ambulating = 85%  Patient Saturations on 2 Liters of oxygen while Ambulating = 92%

## 2024-08-22 NOTE — Progress Notes (Addendum)
      [address] [place] [employer] [address]             [phone]         3 Days Post-Op Procedure(s) (LRB): RIGHT LOWER LOBECTOMY, LUNG, ROBOT-ASSISTED, USING VATS (Right) LYMPH NODE BIOPSY (Right) BLOCK, NERVE, INTERCOSTAL (Right)  Subjective:  Patient states pain is better after chest tube was removed yesterday.  Still having some back pain.  He moved his bowels the day of surgery.  Objective: Vital signs in last 24 hours: Temp:  [97.9 F (36.6 C)-98.3 F (36.8 C)] 98 F (36.7 C) (09/27 0708) Pulse Rate:  [57-76] 65 (09/27 0708) Cardiac Rhythm: A-V Sequential paced (09/27 0701) Resp:  [15-20] 20 (09/27 0708) BP: (110-151)/(58-78) 151/71 (09/27 0708) SpO2:  [94 %-99 %] 97 % (09/27 0708)  Hemodynamic parameters for last 24 hours:   Intake/Output from previous day: 09/26 0701 - 09/27 0700 In: 120 [P.O.:120] Out: 2345 [Urine:2125; Chest Tube:220]  General appearance: alert, cooperative, and no distress Heart: regular rate and rhythm Lungs: clear to auscultation bilaterally Abdomen: soft, non-tender; bowel sounds normal; no masses,  no organomegaly Extremities: extremities normal, atraumatic, no cyanosis or edema Wound: clean and dry  Lab Results: Recent Labs    08/20/24 0205 08/21/24 0223  WBC 10.3 13.7*  HGB 11.2* 13.1  HCT 33.4* 38.4*  PLT 205 236   BMET:  Recent Labs    08/20/24 0205 08/21/24 0223  NA 136 132*  K 4.8 4.6  CL 102 97*  CO2 25 25  GLUCOSE 121* 132*  BUN 14 11  CREATININE 0.91 0.73  CALCIUM  8.5* 8.8*    PT/INR: No results for input(s): LABPROT, INR in the last 72 hours. ABG    Component Value Date/Time   PHART 7.400 03/31/2020 1101   HCO3 24.1 03/31/2020 1101   TCO2 26 02/19/2023 0606   ACIDBASEDEF 1.0 03/31/2020 1101   O2SAT 100.0 03/31/2020 1101   CBG (last 3)  No results for input(s): GLUCAP in the last 72 hours.  Assessment/Plan: S/P Procedure(s) (LRB): RIGHT LOWER LOBECTOMY, LUNG,  ROBOT-ASSISTED, USING VATS (Right) LYMPH NODE BIOPSY (Right) BLOCK, NERVE, INTERCOSTAL (Right)  CV- NSR, BP well controlled Pulm- Posterior chest tube removed yesterday, pigtail in place on water  seal w/o tidaling or air leak.. CXR with tiny trace apical space.. will d/c chest tube today Chronic Pain- continue home medications PAD- on home Plavix  Lovenox  for DVT prophylaxis Dispo- patient stable, d/c chest tube today, repeat in AM if remains stable for discharge tomorrow   LOS: 3 days    Rocky Shad, PA-C 08/22/2024 9:04 AM  Doing well this morning.  Pain much better after chest tube removed. CXR this morning stable, no air leak on exam with pigtail in place Will remove pigtail, hopefully home tomorrow.

## 2024-08-23 ENCOUNTER — Inpatient Hospital Stay (HOSPITAL_COMMUNITY)

## 2024-08-23 NOTE — Progress Notes (Addendum)
      [address] [place] [employer] [address]             [phone]         4 Days Post-Op Procedure(s) (LRB): RIGHT LOWER LOBECTOMY, LUNG, ROBOT-ASSISTED, USING VATS (Right) LYMPH NODE BIOPSY (Right) BLOCK, NERVE, INTERCOSTAL (Right)  Subjective:  Complaints of back pain from sitting in bed, this is chronic for him.  Otherwise doing well, wants to go home.  + BM  Objective: Vital signs in last 24 hours: Temp:  [97.4 F (36.3 C)-98 F (36.7 C)] 97.7 F (36.5 C) (09/28 0723) Pulse Rate:  [61-71] 69 (09/28 0723) Cardiac Rhythm: A-V Sequential paced (09/28 0723) Resp:  [16-20] 16 (09/28 0723) BP: (93-163)/(60-71) 93/60 (09/28 0723) SpO2:  [90 %-97 %] 97 % (09/28 0723)  Intake/Output from previous day: 09/27 0701 - 09/28 0700 In: -  Out: 1090 [Urine:1050; Chest Tube:40] Intake/Output this shift: Total I/O In: 120 [P.O.:120] Out: -   General appearance: alert, cooperative, and no distress Heart: regular rate and rhythm Lungs: clear to auscultation bilaterally Abdomen: soft, non-tender; bowel sounds normal; no masses,  no organomegaly Extremities: extremities normal, atraumatic, no cyanosis or edema Wound: clean and dry  Lab Results: Recent Labs    08/21/24 0223  WBC 13.7*  HGB 13.1  HCT 38.4*  PLT 236   BMET:  Recent Labs    08/21/24 0223  NA 132*  K 4.6  CL 97*  CO2 25  GLUCOSE 132*  BUN 11  CREATININE 0.73  CALCIUM  8.8*    PT/INR: No results for input(s): LABPROT, INR in the last 72 hours. ABG    Component Value Date/Time   PHART 7.400 03/31/2020 1101   HCO3 24.1 03/31/2020 1101   TCO2 26 02/19/2023 0606   ACIDBASEDEF 1.0 03/31/2020 1101   O2SAT 100.0 03/31/2020 1101   CBG (last 3)  No results for input(s): GLUCAP in the last 72 hours.  Assessment/Plan: S/P Procedure(s) (LRB): RIGHT LOWER LOBECTOMY, LUNG, ROBOT-ASSISTED, USING VATS (Right) LYMPH NODE BIOPSY (Right) BLOCK, NERVE, INTERCOSTAL (Right)  CV-  continues to maintain NSR, BP remains well controlled Pulm- CT was removed yesterday, post pull CXR there was questionable basilar pneumothorax vs. Gaseous distention below diaphragm.. 2V this AM does not show basilar pneumothorax, there is stable apical space post lobectomy... He has been unable to wean off oxygen and is requiring 2L, home use has been arranged Chronic Pain- home medications, no pain prescription to be provided at discharge PAD- on home Plavix , will resume Brilinta  today  Dispo- patient is stable, home oxygen has been arranged will continue to wean as outpatient, will plan to d/c home today   LOS: 4 days    Roy Shad, PA-C 08/23/2024 8:03 AM  Chest tube removed yesterday.  Patient remained stable from respiratory standpoint.  Chest x-ray yesterday demonstrated possible right-sided pneumothorax but 2 view x-ray today did not show a pneumothorax.  Patient is unable to be weaned off nasal cannula but we will arrange for home oxygen.  He is ready and stable for discharge.  Will plan for home today.  Roy Clunes, MD Cardiothoracic Surgery Pager: [phone]

## 2024-08-23 NOTE — Plan of Care (Signed)
  Problem: Clinical Measurements: Goal: Diagnostic test results will improve Outcome: Progressing Goal: Respiratory complications will improve Outcome: Progressing Goal: Cardiovascular complication will be avoided Outcome: Progressing   Problem: Activity: Goal: Risk for activity intolerance will decrease Outcome: Progressing   Problem: Pain Managment: Goal: General experience of comfort will improve and/or be controlled Outcome: Progressing   Problem: Safety: Goal: Ability to remain free from injury will improve Outcome: Progressing

## 2024-08-23 NOTE — TOC Transition Note (Signed)
 Transition of Care [facility]) - Discharge Note   Patient Details  Name: Roy [name] MRN: [id number] Date of Birth: [birth year]  Transition of Care [facility]) CM/SW Contact:  Robynn Eileen Hoose, RN Phone Number: 08/23/2024, 7:51 AM   Clinical Narrative:   Secure message from floor nurse regarding patient needing home oxygen before being discharged today.  Home O2 ordered through [place] with [employer]. Portable oxygen tank to be delivered to bedside prior to patient discharging home.    Final next level of care: Home/Self Care Barriers to Discharge: No Barriers Identified   Patient Goals and CMS Choice            Discharge Placement                       Discharge Plan and Services Additional resources added to the After Visit Summary for                  DME Arranged: Oxygen DME Agency: [employer] Date DME Agency Contacted: 08/23/24 Time DME Agency Contacted: [phone] Representative spoke with at DME Agency: [place]            Social Drivers of Health (SDOH) Interventions SDOH Screenings   Food Insecurity: No Food Insecurity (08/19/2024)  Housing: Low Risk  (08/19/2024)  Transportation Needs: No Transportation Needs (08/19/2024)  Utilities: Not At Risk (08/19/2024)  Alcohol Screen: Low Risk  (06/21/2022)  Depression (PHQ2-9): Low Risk  (06/21/2022)  Financial Resource Strain: Low Risk  (06/21/2022)  Physical Activity: Inactive (06/21/2022)  Social Connections: Moderately Integrated (08/19/2024)  Stress: No Stress Concern Present (06/21/2022)  Tobacco Use: High Risk (08/19/2024)     Readmission Risk Interventions     No data to display

## 2024-08-24 ENCOUNTER — Telehealth: Payer: Self-pay

## 2024-08-24 LAB — SURGICAL PATHOLOGY

## 2024-08-24 NOTE — Patient Instructions (Signed)
 Visit Information  Thank you for taking time to visit with me today. Please don't hesitate to contact me if I can be of assistance to you before our next scheduled telephone appointment.  Our next appointment is by telephone on 09/02/24 at 1130 am  Following is a copy of your care plan:   Goals Addressed             This Visit's Progress    VBCI Transitions of Care (TOC) Care Plan       Problems:  Recent Hospitalization for treatment of Right lower lobe lobectomy Knowledge Deficit Related to Right lower lobe lobectomy and No Hospital Follow Up Provider appointment Patient to call PCP  Goal:  Over the next 30 days, the patient will not experience hospital readmission  Interventions:  Transitions of Care: Doctor Visits  - discussed the importance of doctor visits Post-op wound/incision care reviewed with patient/caregiver Reviewed Signs and symptoms of infection  Patient Self Care Activities:  Attend all scheduled provider appointments Call pharmacy for medication refills 3-7 days in advance of running out of medications Call provider office for new concerns or questions  Notify RN Care Manager of TOC call rescheduling needs Participate in Transition of Care Program/Attend TOC scheduled calls Perform all self care activities independently  Take medications as prescribed   Monitor for swelling, redness, pain, pus, and fever Keep wound clean and dry.   Notify physician immediately for any changes   Plan:  The patient has been provided with contact information for the care management team and has been advised to call with any health related questions or concerns.         Patient verbalizes understanding of instructions and care plan provided today and agrees to view in MyChart. Active MyChart status and patient understanding of how to access instructions and care plan via MyChart confirmed with patient.     The patient has been provided with contact information for the care  management team and has been advised to call with any health related questions or concerns.   Please call the care guide team at [phone] if you need to cancel or reschedule your appointment.   Please call the Suicide and Crisis Lifeline: 988 call the [place]  National Suicide Prevention Lifeline: 564-398-5377 or TTY: (859) 227-6579 TTY (432) 339-5276) to talk to a trained counselor if you are experiencing a Mental Health or Behavioral Health Crisis or need someone to talk to.  Etrulia Zarr J. Izzy Doubek RN, MSN [facility], [facility] RN Care Manager Direct Dial: [phone]  Fax: [phone] Website: [email or web address]

## 2024-08-24 NOTE — Transitions of Care (Post Inpatient/ED Visit) (Signed)
 08/24/2024  Name: Roy [name] MRN: [id number] DOB: 1946/03/27  Today's TOC FU Call Status: Today's TOC FU Call Status:: Successful TOC FU Call Completed TOC FU Call Complete Date: 08/24/24 Patient's Name and Date of Birth confirmed.  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, right How have you been since you were released from the hospital?: Better Any questions or concerns?: No  Items Reviewed: Did you receive and understand the discharge instructions provided?: Yes Medications obtained,verified, and reconciled?: Yes (Medications Reviewed) Any new allergies since your discharge?: No Dietary orders reviewed?: Yes Type of Diet Ordered:: Heart Healthy Do you have support at home?: Yes People in Home [RPT]: spouse, child(ren), adult Name of Support/Comfort Primary Source: Joen  Medications Reviewed Today: Medications Reviewed Today     Reviewed by Jaymi Tinner, RN (Case Manager) on 08/24/24 at 1435  Med List Status: <None>   Medication Order Taking? Sig Documenting Provider Last Dose Status Informant  aspirin  EC 81 MG tablet [id number] Yes Take 81 mg by mouth every evening.  [provider]  Active Self  atorvastatin  (LIPITOR ) 20 MG tablet [id number] Yes Take 20 mg by mouth every evening. [provider]  Active Self  BRILINTA  90 MG TABS tablet [id number] Yes Take 90 mg by mouth 2 (two) times daily. [provider]  Active Self  carvedilol  (COREG ) 6.25 MG tablet [id number] Yes TAKE 1 TABLET TWICE A DAY WITH MEALS (DOSE CHANGE, NEW PRESCRIPTION) Waddell Danelle ORN, MD  Active   cetirizine (ZYRTEC) 10 MG tablet [id number] Yes Take 10 mg by mouth daily. [provider]  Active Self  clotrimazole -betamethasone  (LOTRISONE )  cream [id number]  Apply 1 Application topically 2 (two) times daily.  Patient not taking: Reported on 08/24/2024   Sherrilee Belvie LITTIE, MD  Active   Cobalamin Combinations (NEURIVA PLUS) CAPS [id number] Yes Take 1 capsule by mouth daily. [provider]  Active Self  dextromethorphan -guaiFENesin  (MUCINEX  DM) 30-600 MG 12hr tablet [id number] Yes Take 1 tablet by mouth 2 (two) times daily. [provider]  Active Self  gabapentin  (NEURONTIN ) 300 MG capsule [id number] Yes Take 300 mg by mouth 2 (two) times daily.  [provider]  Active Self  HYDROcodone -acetaminophen  (NORCO) 10-325 MG tablet [id number] Yes Take 1 tablet by mouth at bedtime. [provider]  Active   HYDROcodone -acetaminophen  (NORCO/VICODIN) 5-325 MG tablet [id number] Yes Take 1-2 tablets by mouth See admin instructions. Take 2 tablets in the morning and evening, may take 1 tablet at lunch as needed for pain [provider]  Active Self  Magnesium  250 MG TABS [id number] Yes Take 250 mg by mouth every evening. [provider]  Active Self  Multiple Vitamins-Minerals (CENTRUM SILVER PO) [id number] Yes Take 1 tablet by mouth daily. 50+ [provider]  Active Self  Naphazoline HCl (CLEAR EYES OP) [id number] Yes Place 1 drop into both eyes daily as needed (dry eyes). [provider]  Active Self  Omega-3 Fatty Acids (FISH OIL ) 1000 MG CAPS [id number] Yes Take 1,000 mg by mouth every evening.  Patient taking differently: Take 1,000 mg by mouth 2 (two) times daily.   [provider]  Active Self  sacubitril -valsartan  (ENTRESTO ) 97-103 MG [id number] Yes Take 1 tablet by mouth 2 (two) times daily. Alvan Dorn FALCON, MD  Active   Salicylic Acid (GOLD BOND PSORIASIS RELIEF) 3 % CREA  [id number] Yes Apply 1 application topically daily as needed (psoriasis). [provider]  Active Self  silodosin (RAPAFLO) 8 MG CAPS capsule [id number] Yes Take 8 mg by mouth daily with  breakfast. [provider]  Active Self  SUPER B COMPLEX/C PO [id number] Yes Take 1 tablet by mouth every evening. [provider]  Active Self  tamsulosin  (FLOMAX ) 0.4 MG CAPS capsule [id number] Yes Take 0.4 mg by mouth daily after supper. [provider]  Active Self  traMADol  (ULTRAM ) 50 MG tablet [id number] Yes Take 50 mg by mouth 2 (two) times daily as needed for moderate pain (pain score 4-6). [provider]  Active Self           Med Note ROLENE STALLION, SUSAN A   Tue Jul 28, 2024  2:10 PM)              Home Care and Equipment/Supplies: Were Home Health Services Ordered?: No Any new equipment or medical supplies ordered?: Yes Name of Medical supply agency?: [employer] Were you able to get the equipment/medical supplies?: Yes Do you have any questions related to the use of the equipment/supplies?: No  Functional Questionnaire: Do you need assistance with bathing/showering or dressing?: No Do you need assistance with meal preparation?: No Do you need assistance with eating?: No Do you have difficulty maintaining continence: No Do you need assistance with getting out of bed/getting out of a chair/moving?: No Do you have difficulty managing or taking your medications?: No  Follow up appointments reviewed: PCP Follow-up appointment confirmed?: No (Patient to call PCP) MD Provider Line Number:[phone] Given: No Specialist Hospital Follow-up appointment confirmed?: Yes Date of Specialist follow-up appointment?: 09/01/24 Follow-Up Specialty Provider:: Dr. Kerrin Do you need transportation to your follow-up appointment?: No (Patient states he normally drives but daughter is taking to appointments ubnil surgeon clears him.) Do you understand care options if your condition(s) worsen?: Yes-patient verbalized understanding (Monitor for swelling, redness, pain, pus, and fever  Keep wound clean and dry.    Notify physician immediately for any  changes)  SDOH Interventions Today    Flowsheet Row Most Recent Value  SDOH Interventions   Food Insecurity Interventions Intervention Not Indicated  Housing Interventions Intervention Not Indicated  Transportation Interventions Intervention Not Indicated  Utilities Interventions Intervention Not Indicated    Aadil Sur J. Milissa Fesperman RN, MSN [facility]  [facility], [facility] RN Care Manager Direct Dial: [phone]  Fax: [phone] Website: [email or web address]

## 2024-08-31 ENCOUNTER — Other Ambulatory Visit: Payer: Self-pay | Admitting: Thoracic Surgery (Cardiothoracic Vascular Surgery)

## 2024-08-31 DIAGNOSIS — R918 Other nonspecific abnormal finding of lung field: Secondary | ICD-10-CM

## 2024-09-01 ENCOUNTER — Ambulatory Visit
Payer: Self-pay | Attending: Thoracic Surgery (Cardiothoracic Vascular Surgery) | Admitting: Thoracic Surgery (Cardiothoracic Vascular Surgery)

## 2024-09-01 ENCOUNTER — Ambulatory Visit (HOSPITAL_COMMUNITY)
Admission: RE | Admit: 2024-09-01 | Discharge: 2024-09-01 | Disposition: A | Discharge status: Home / Self Care | Source: Ambulatory Visit | Attending: Student in an Organized Health Care Education/Training Program | Admitting: Student in an Organized Health Care Education/Training Program

## 2024-09-01 VITALS — BP 152/71 | HR 65 | Resp 18 | Ht 63.0 in | Wt 143.0 lb

## 2024-09-01 DIAGNOSIS — T797XXA Traumatic subcutaneous emphysema, initial encounter: Secondary | ICD-10-CM | POA: Diagnosis not present

## 2024-09-01 DIAGNOSIS — Z96611 Presence of right artificial shoulder joint: Secondary | ICD-10-CM | POA: Diagnosis not present

## 2024-09-01 DIAGNOSIS — R918 Other nonspecific abnormal finding of lung field: Secondary | ICD-10-CM | POA: Diagnosis not present

## 2024-09-01 DIAGNOSIS — J90 Pleural effusion, not elsewhere classified: Secondary | ICD-10-CM | POA: Diagnosis not present

## 2024-09-01 DIAGNOSIS — I517 Cardiomegaly: Secondary | ICD-10-CM | POA: Diagnosis not present

## 2024-09-01 DIAGNOSIS — Z902 Acquired absence of lung [part of]: Secondary | ICD-10-CM

## 2024-09-01 MED ORDER — METHOCARBAMOL 500 MG PO TABS: 500.0000 mg | ORAL_TABLET | Freq: Three times a day (TID) | ORAL | 3 refills | Status: DC | PRN

## 2024-09-01 MED ORDER — METHOCARBAMOL 500 MG PO TABS: 500.0000 mg | ORAL_TABLET | Freq: Four times a day (QID) | ORAL | 0 refills | Status: DC

## 2024-09-01 NOTE — Progress Notes (Signed)
 [address] [name] [address]             [phone]   HPI: Roy [name] returns for scheduled postoperative follow-up visit  Roy [name] is a 78 year old male with history of tobacco abuse, emphysema, MI, left bundle branch block, congestive heart failure, ICD, ethanol abuse, cirrhosis, PAD, hypertension, hyperlipidemia, and thrombocytopenia.  He was found to have a 3.1 centimeter spiculated mass on a low-dose CT.  Biopsy showed squamous cell carcinoma.  He underwent robotic assisted right lower lobectomy on 08/19/2024.  His postoperative course was unremarkable and he went home on day 4.  He did go home on 2 L nasal cannula home oxygen.  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  Since discharge she has been having some issues with spasms of pain.  Is taking tramadol  in the morning and then using either a 5/325 or 10/325 hydrocodone /acetaminophen  tablet at night depending on how bad the pain is.  He has been on the Vicodin chronically and gets his prescription from Dr. Shona.  Also is on gabapentin  300 mg twice daily.  He has not had any respiratory issues.  Feels like he no longer needs oxygen.  Past Medical History:  Diagnosis Date   AICD (automatic cardioverter/defibrillator) present 08/19/2015   [employer] BiV ICD for primary prevention by Dr. Waddell   Alcohol abuse    6 beers per day; hospital admission in 2009 for withdrawal symptoms   Anxiety and depression    denies    Cerebrovascular disease 2009   TIA; 2009- right ICA stent; re-intervention for restenosis complicated by [facility] w/o sx   CHF (congestive heart failure) (HCC) 06/02/2014   Chronic obstructive pulmonary disease (HCC)    Degenerative joint disease    Total shoulder arthroplasty-right   Hyperlipidemia    Hypertension    LBBB (left bundle branch block)    Normal echo-2011; stress nuclear in 09/2010--septal  hypoperfusion representing nontransmural infarction or the effect of left bundle branch block, no ischemia   Lung cancer (HCC)    Myocardial infarction (HCC) 06/02/2014   Massive Heart Attack   Peripheral vascular disease    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   Thrombocytopenia    Tobacco abuse    -100 pack years; 1.5 packs per day   Traumatic seroma of thigh    left   Tubular adenoma of colon     Current Outpatient Medications  Medication Sig Dispense Refill   aspirin  EC 81 MG tablet Take 81 mg by mouth every evening.      atorvastatin  (LIPITOR ) 20 MG tablet Take 20 mg by mouth every evening.     BRILINTA  90 MG TABS tablet Take 90 mg by mouth 2 (two) times daily.     carvedilol  (COREG ) 6.25 MG tablet TAKE 1 TABLET TWICE A DAY WITH MEALS (DOSE CHANGE, NEW PRESCRIPTION) 180 tablet 2   cetirizine (ZYRTEC) 10 MG tablet Take 10 mg by mouth daily.     clotrimazole -betamethasone  (LOTRISONE ) cream Apply 1 Application topically 2 (two) times daily. 30 g 3   Cobalamin Combinations (NEURIVA PLUS) CAPS Take 1 capsule by mouth daily.     dextromethorphan -guaiFENesin  (MUCINEX  DM) 30-600 MG 12hr tablet Take 1 tablet by mouth 2 (two) times daily.     gabapentin  (NEURONTIN ) 300 MG capsule Take 600 mg by mouth 2 (two) times daily.  HYDROcodone -acetaminophen  (NORCO) 10-325 MG tablet Take 1 tablet by mouth at bedtime.     HYDROcodone -acetaminophen  (NORCO/VICODIN) 5-325 MG tablet Take 1-2 tablets by mouth See admin instructions. Take 2 tablets in the morning and evening, may take 1 tablet at lunch as needed for pain     Magnesium  250 MG TABS Take 250 mg by mouth every evening.     methocarbamol (ROBAXIN) 500 MG tablet Take 1 tablet (500 mg total) by mouth every 8 (eight) hours as needed for muscle spasms. 15 tablet 3   methocarbamol (ROBAXIN) 500 MG tablet Take 1 tablet (500 mg total) by mouth 4 (four) times daily. 10 tablet 0   Multiple Vitamins-Minerals (CENTRUM SILVER PO) Take 1  tablet by mouth daily. 50+     Naphazoline HCl (CLEAR EYES OP) Place 1 drop into both eyes daily as needed (dry eyes).     Omega-3 Fatty Acids (FISH OIL ) 1000 MG CAPS Take 1,000 mg by mouth every evening. (Patient taking differently: Take 1,000 mg by mouth 2 (two) times daily.)     sacubitril -valsartan  (ENTRESTO ) 97-103 MG Take 1 tablet by mouth 2 (two) times daily. 60 tablet 11   Salicylic Acid (GOLD BOND PSORIASIS RELIEF) 3 % CREA Apply 1 application topically daily as needed (psoriasis).     silodosin (RAPAFLO) 8 MG CAPS capsule Take 8 mg by mouth daily with breakfast.     SUPER B COMPLEX/C PO Take 1 tablet by mouth every evening.     tamsulosin  (FLOMAX ) 0.4 MG CAPS capsule Take 0.4 mg by mouth daily after supper.     traMADol  (ULTRAM ) 50 MG tablet Take 50 mg by mouth 2 (two) times daily as needed for moderate pain (pain score 4-6).     No current facility-administered medications for this visit.    Physical Exam BP (!) 152/71 (BP Location: Left Arm)   Pulse 65   Resp 18   Ht 5' 3 (1.6 m)   Wt 143 lb (64.9 kg)   SpO2 98% Comment: 2L O2  BMI 25.80 kg/m  78 year old man in no acute distress Alert and oriented x 3 Lungs diminished to right base but otherwise clear Cardiac regular rate and rhythm Incisions clean dry intact Tender to palpation around 9th and 10th ribs posteriorly. No peripheral edema  Diagnostic Tests: I personally reviewed his chest x-ray images.  Minimal if any change from 08/23/2024.  Postop changes present.  Impression: Roy [name] is a 78 year old male with history of tobacco abuse, emphysema, MI, left bundle branch block, congestive heart failure, ICD, ethanol abuse, cirrhosis, PAD, hypertension, hyperlipidemia, thrombocytopenia, and a stage Ib (T2a, N0) squamous cell carcinoma of the right lower lobe.  Status post right lower lobectomy-went home on oxygen.  Probably does not need it anymore.  Will have the home health nurse check his sats on room air with  ambulation to see if we can discontinue that.  Postoperative pain-sounds most consistent with muscle spasms.  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  I gave her prescription for Robaxin 500 mg p.o. 3 times daily as needed.  I sent prescription to his mail order pharmacy, but gave him a prescription for 10 pills from a local pharmacy to tide him over until the mail order arrives.  Recommended he increase his gabapentin  to 600 mg twice daily.  Also recommended to use Tylenol  in addition to the other medications up to a total dose of 4000 mg daily.  Did remind him that the hydrocodone  has 325  mg which needs to be counted against the total.  At this point there are no restrictions on his physical activities but he was cautioned to build into new activities gradually.  Should not drive until his pain is better controlled.  Plan: Pain management as outlined above Return in 3 weeks with PA and lateral chest x-ray to check on progress.  Will make oncology referral at that time  [name], MD [facility] [phone]

## 2024-09-01 NOTE — Patient Instructions (Addendum)
 Increase gabapentin  to 600 mg (2 tablets) twice daily  Take acetaminophen  (Tylenol ) up to 4000 mg a day.  Would take 2 extra strength in morning and then use as needed. Remember Norco/ vicodin has 325 mg of acetaminophen   Prescription for Robaxin for muscle spasms  Continue to use tramadol  and Norco as needed

## 2024-09-02 ENCOUNTER — Encounter: Payer: Self-pay | Admitting: Urology

## 2024-09-02 ENCOUNTER — Other Ambulatory Visit: Payer: Self-pay

## 2024-09-02 ENCOUNTER — Ambulatory Visit (INDEPENDENT_AMBULATORY_CARE_PROVIDER_SITE_OTHER): Admitting: Urology

## 2024-09-02 VITALS — BP 166/71 | HR 67

## 2024-09-02 DIAGNOSIS — N471 Phimosis: Secondary | ICD-10-CM

## 2024-09-02 NOTE — Progress Notes (Signed)
 09/02/2024 9:13 AM   Quintin LITTIE Clause 1946-06-17 [id number]  Referring provider: Shona Norleen PEDLAR, MD [address] [place],  [place] [address]  Followup balanitis.                HPI: Mr Bauserman is a 78yo here for followup for balanitis. He applied clotrimazole  which failed to improve his balanitis. He continues to have difficulty retracting his foreskin.    PMH: Past Medical History:  Diagnosis Date   AICD (automatic cardioverter/defibrillator) present 08/19/2015   St Jude BiV ICD for primary prevention by Dr. Waddell   Alcohol abuse    6 beers per day; hospital admission in 2009 for withdrawal symptoms   Anxiety and depression    denies    Cerebrovascular disease 2009   TIA; 2009- right ICA stent; re-intervention for restenosis complicated by [facility] w/o sx   CHF (congestive heart failure) (HCC) 06/02/2014   Chronic obstructive pulmonary disease (HCC)    Degenerative joint disease    Total shoulder arthroplasty-right   Hyperlipidemia    Hypertension    LBBB (left bundle branch block)    Normal echo-2011; stress nuclear in 09/2010--septal hypoperfusion representing nontransmural infarction or the effect of left bundle branch block, no ischemia   Lung cancer (HCC)    Myocardial infarction (HCC) 06/02/2014   Massive Heart Attack   Peripheral vascular disease    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   Thrombocytopenia    Tobacco abuse    -100 pack years; 1.5 packs per day   Traumatic seroma of thigh    left   Tubular adenoma of colon     Surgical History: Past Surgical History:  Procedure Laterality Date   ABDOMINAL AORTAGRAM [name] 05/04/2014   Procedure: ABDOMINAL EZELLA;  Surgeon: Gaile LELON New, MD;  Location: [facility] CATH LAB;  Service: Cardiovascular;  Laterality: [name];   ABDOMINAL AORTOGRAM [name] 06/25/2017   Procedure: Abdominal Aortogram;  Surgeon: New Gaile LELON, MD;  [facility] INVAS[facility] CV LAB;  Service: Cardiovascular;  Laterality: [name];   ABDOMINAL  AORTOGRAM W/LOWER EXTREMITY [name] 04/22/2018   Procedure: ABDOMINAL AORTOGRAM W/LOWER EXTREMITY;  Surgeon: New Gaile LELON, MD;  [facility];  Service: Cardiovascular;  Laterality: [name];   ABDOMINAL AORTOGRAM W/LOWER EXTREMITY [name] 03/08/2020   Procedure: ABDOMINAL AORTOGRAM W/LOWER EXTREMITY;  Surgeon: New Gaile LELON, MD;  [facility];  Service: Cardiovascular;  Laterality: [name];   ABDOMINAL AORTOGRAM W/LOWER EXTREMITY Bilateral 08/23/2020   Procedure: ABDOMINAL AORTOGRAM W/LOWER EXTREMITY;  Surgeon: New Gaile LELON, MD;  [facility];  Service: Cardiovascular;  Laterality: Bilateral;   ABDOMINAL AORTOGRAM W/LOWER EXTREMITY [name] 10/31/2021   Procedure: ABDOMINAL AORTOGRAM W/LOWER EXTREMITY;  Surgeon: New Gaile LELON, MD;  [facility];  Service: Cardiovascular;  Laterality: [name];   ABDOMINAL AORTOGRAM W/LOWER EXTREMITY [name] 12/19/2021   Procedure: ABDOMINAL AORTOGRAM W/LOWER EXTREMITY;  Surgeon: New Gaile LELON, MD;  [facility];  Service: Cardiovascular;  Laterality: [name];   ABDOMINAL AORTOGRAM W/LOWER EXTREMITY Bilateral 02/19/2023   Procedure: ABDOMINAL AORTOGRAM W/LOWER EXTREMITY;  Surgeon: New Gaile LELON, MD;  [facility];  Service: Cardiovascular;  Laterality: Bilateral;   AGILE CAPSULE [name] 03/18/2014   Procedure: AGILE CAPSULE;  Surgeon: Margo LITTIE Haddock, MD;  [facility];  Service: Endoscopy;  Laterality: [name];  7:30   APPLICATION OF WOUND VAC Left 09/03/2017   Procedure: APPLICATION OF WOUND VAC;  Surgeon: Laurence Redell LITTIE, MD;  [facility] OR;  Service: Vascular;  Laterality: Left;   APPLICATION OF WOUND VAC Right 04/07/2020   Procedure: PLACEMENT OF ANTIBIOTIC BEADS AND APPLICATION OF WOUND VAC;  Surgeon: Serene Gaile ORN, MD;  [facility];  Service: Vascular;  Laterality: Right;   BACK SURGERY     BACTERIAL OVERGROWTH TEST [name] 05/24/2015   Procedure: BACTERIAL OVERGROWTH TEST;   Surgeon: Margo LITTIE Haddock, MD;  [facility];  Service: Endoscopy;  Laterality: [name];  0700   BI-VENTRICULAR IMPLANTABLE CARDIOVERTER DEFIBRILLATOR  (CRT-D)  08/19/2015   BIV ICD GENERATOR CHANGEOUT [name] 11/11/2023   Procedure: BIV ICD GENERATOR CHANGEOUT;  Surgeon: Waddell Danelle ORN, MD;  Location: [facility] INVASIVE CV LAB;  Service: Cardiovascular;  Laterality: [name];   BRONCHIAL BIOPSY  07/30/2024   Procedure: BRONCHOSCOPY, WITH BIOPSY;  Surgeon: Kerrin Elspeth BROCKS, MD;  Location: [facility] ENDOSCOPY;  Service: Thoracic;;   BRONCHIAL NEEDLE ASPIRATION BIOPSY  07/30/2024   Procedure: BRONCHOSCOPY, WITH NEEDLE ASPIRATION BIOPSY;  Surgeon: Kerrin Elspeth BROCKS, MD;  Location: [facility] ENDOSCOPY;  Service: Thoracic;;   CARPAL TUNNEL RELEASE Left 02/02/2016   Procedure: LEFT CARPAL TUNNEL RELEASE;  Surgeon: Arley Curia, MD;  Location: [facility];  Service: Orthopedics;  Laterality: Left;  ANESTHESIA: IV REGIONAL UPPER ARM   CATARACT EXTRACTION W/PHACO Right 03/08/2015   Procedure: CATARACT EXTRACTION PHACO AND INTRAOCULAR LENS PLACEMENT (IOC);  Surgeon: Oneil Platts, MD;  [facility]: [facility] ORS;  Service: Ophthalmology;  Laterality: Right;  CDE:9.46   CATARACT EXTRACTION W/PHACO Left 03/22/2015   Procedure: CATARACT EXTRACTION PHACO AND INTRAOCULAR LENS PLACEMENT (IOC);  Surgeon: Oneil Platts, MD;  Location: [facility] OR[facility];  Service: Ophthalmology;  Laterality: Left;  CDE:5.80   COLONOSCOPY  08/22/09   Fields-(Tubular Adenoma)3-mm transverse polyp/4-mm polyp otherwise noraml/small internal hemorrhoids   COLONOSCOPY [name] 04/14/2014   DOQ:dfjoo internal hemorrhids/normal mocsa in the terminal iluem/left colonis redundant   COLONOSCOPY [name] 07/01/2024   Procedure: COLONOSCOPY;  Surgeon: Shaaron Lamar HERO, MD;  [facility];  Service: Endoscopy;  Laterality: [name];  900am, asa 3   COLONOSCOPY W/ POLYPECTOMY  2011   ENDARTERECTOMY FEMORAL Left 08/16/2017   Procedure: ENDARTERECTOMY LEFT PROFUNDA FEMORAL;  Surgeon:  Serene Gaile ORN, MD;  Location: [facility] OR;  Service: Vascular;  Laterality: Left;   ENDARTERECTOMY FEMORAL Right 03/31/2020   Procedure: RIGHT FEMORAL ENDARTERECTOMY;  Surgeon: Serene Gaile ORN, MD;  [facility];  Service: Vascular;  Laterality: Right;   EP IMPLANTABLE DEVICE [name] 08/19/2015   Procedure: BiV ICD Insertion CRT-D;  Surgeon: Danelle ORN Waddell, MD;  Location: [facility] INVASIVE CV LAB;  Service: Cardiovascular;  Laterality: [name];   ESOPHAGOGASTRODUODENOSCOPY [name] 03/05/2014   SLF: 1. Stricture at the gastroesophagael junction 2. Small hiatal hernia 3. Moderate non-erosive gastritis and duodentitis. 4. No surce for Melena identified.    FEMORAL-POPLITEAL BYPASS GRAFT Left 08/16/2017   Procedure: LEFT  FEMORAL-POPLITEAL ARTERY  BYPASS GRAFT;  Surgeon: Serene Gaile ORN, MD;  [facility];  Service: Vascular;  Laterality: Left;   GIVENS CAPSULE STUDY [name] 03/30/2014   Procedure: GIVENS CAPSULE STUDY;  Surgeon: Margo LITTIE Haddock, MD;  [facility];  Service: Endoscopy;  Laterality: [name];  7:30   GROIN DEBRIDEMENT Right 04/07/2020   Procedure: INCISION AND DRAINAGE OF RIGHT GROIN;  Surgeon: Serene Gaile ORN, MD;  [facility];  Service: Vascular;  Laterality: Right;   I & D EXTREMITY Left 09/03/2017   Procedure: IRRIGATION AND DEBRIDEMENT EXTREMITY LEFT THIGH SEROMA;  Surgeon: Laurence Redell LITTIE, MD;  [facility];  Service: Vascular;  Laterality: Left;   INSERT / REPLACE / REMOVE PACEMAKER     INSERTION OF ILIAC STENT  03/31/2020   Procedure: INSERTION OF RIGHT ILIAC ARTERY STENT AND RIGHT SUPERFICIAL FEMORAL ARTERY STENT;  Surgeon: Serene Gaile ORN, MD;  [facility];  Service: Vascular;;   INTERCOSTAL NERVE BLOCK Right 08/19/2024   Procedure: BLOCK, NERVE, INTERCOSTAL;  Surgeon: Kerrin Elspeth BROCKS, MD;  [facility];  Service: Thoracic;  Laterality: Right;   IR ANGIO INTRA EXTRACRAN SEL COM CAROTID INNOMINATE BILAT MOD SED  07/16/2017   IR ANGIO INTRA EXTRACRAN SEL COM CAROTID INNOMINATE  BILAT MOD SED  06/12/2019   IR ANGIO INTRA EXTRACRAN SEL COM CAROTID INNOMINATE BILAT MOD SED  01/17/2021   IR ANGIO VERTEBRAL SEL SUBCLAVIAN INNOMINATE BILAT MOD SED  07/16/2017   IR ANGIO VERTEBRAL SEL SUBCLAVIAN INNOMINATE BILAT MOD SED  06/12/2019   IR ANGIO VERTEBRAL SEL SUBCLAVIAN INNOMINATE BILAT MOD SED  01/17/2021   IR RADIOLOGIST EVAL & MGMT  04/01/2017   IR RADIOLOGIST EVAL & MGMT  08/02/2017   IR TRANSCATH EXCRAN VERT OR CAR A STENT  07/22/2019   IR US  GUIDE VASC ACCESS RIGHT  06/12/2019   IR US  GUIDE VASC ACCESS RIGHT  01/17/2021   JOINT REPLACEMENT Right    Total Shoulder Replacement    LOBECTOMY, LUNG, ROBOT-ASSISTED, USING VATS Right 08/19/2024   Procedure: RIGHT LOWER LOBECTOMY, LUNG, ROBOT-ASSISTED, USING VATS;  Surgeon: Kerrin Elspeth BROCKS, MD;  [facility];  Service: Thoracic;  Laterality: Right;  ROBOTIC RIGHT LOWER LOBECTOMY   LOWER EXTREMITY ANGIOGRAPHY Bilateral 06/25/2017   Procedure: Lower Extremity Angiography;  Surgeon: Serene Gaile ORN, MD;  [facility];  Service: Cardiovascular;  Laterality: Bilateral;   LUMBAR FUSION  2010   LYMPH NODE BIOPSY Right 08/19/2024   Procedure: LYMPH NODE BIOPSY;  Surgeon: Kerrin Elspeth BROCKS, MD;  Location: [facility] OR;  Service: Thoracic;  Laterality: Right;   PATCH ANGIOPLASTY Right 03/31/2020   Procedure: PATCH ANGIOPLASTY USING XENOSURE BIOLOGIC PATCH 1cm x 14cm;  Surgeon: Serene Gaile ORN, MD;  Location: [facility] OR;  Service: Vascular;  Laterality: Right;   PERIPHERAL VASCULAR ATHERECTOMY Right 04/22/2018   Procedure: PERIPHERAL VASCULAR ATHERECTOMY;  Surgeon: Serene Gaile ORN, MD;  [facility];  Service: Cardiovascular;  Laterality: Right;  right superficial femoral   PERIPHERAL VASCULAR BALLOON ANGIOPLASTY Right 04/22/2018   Procedure: PERIPHERAL VASCULAR BALLOON ANGIOPLASTY;  Surgeon: Serene Gaile ORN, MD;  [facility];  Service: Cardiovascular;  Laterality: Right;  external iliac   PERIPHERAL  VASCULAR BALLOON ANGIOPLASTY Left 03/08/2020   Procedure: PERIPHERAL VASCULAR BALLOON ANGIOPLASTY;  Surgeon: Serene Gaile ORN, MD;  [facility];  Service: Cardiovascular;  Laterality: Left;  Common Femoral   PERIPHERAL VASCULAR BALLOON ANGIOPLASTY Left 08/23/2020   Procedure: PERIPHERAL VASCULAR BALLOON ANGIOPLASTY;  Surgeon: Serene Gaile ORN, MD;  [facility];  Service: Cardiovascular;  Laterality: Left;  common femoral   PERIPHERAL VASCULAR BALLOON ANGIOPLASTY Left 10/31/2021   Procedure: PERIPHERAL VASCULAR BALLOON ANGIOPLASTY;  Surgeon: Serene Gaile ORN, MD;  [facility];  Service: Cardiovascular;  Laterality: Left;  external iliac   PERIPHERAL VASCULAR INTERVENTION Right 03/08/2020   Procedure: PERIPHERAL VASCULAR INTERVENTION;  Surgeon: Serene Gaile ORN, MD;  [facility];  Service: Cardiovascular;  Laterality: Right;  SFA   PERIPHERAL VASCULAR INTERVENTION Right 08/23/2020   Procedure: PERIPHERAL VASCULAR INTERVENTION;  Surgeon: Serene,  Gaile ORN, MD;  [facility];  Service: Cardiovascular;  Laterality: Right;  external iliac   PERIPHERAL VASCULAR INTERVENTION Right 10/31/2021   Procedure: PERIPHERAL VASCULAR INTERVENTION;  Surgeon: Serene Gaile ORN, MD;  [facility];  Service: Cardiovascular;  Laterality: Right;  superficial femoral artery   PERIPHERAL VASCULAR INTERVENTION Left 12/19/2021   Procedure: PERIPHERAL VASCULAR INTERVENTION;  Surgeon: Serene Gaile ORN, MD;  [facility];  Service: Cardiovascular;  Laterality: Left;  stent lt ext iliac / pta common femoral   PERIPHERAL VASCULAR INTERVENTION  02/19/2023   Procedure: PERIPHERAL VASCULAR INTERVENTION;  Surgeon: Serene Gaile ORN, MD;  [facility];  Service: Cardiovascular;;   RADIOLOGY WITH ANESTHESIA [name] 07/01/2019   Procedure: STENTING;  Surgeon: Dolphus Carrion, MD;  [facility];  Service: Radiology;   Laterality: [name];   RADIOLOGY WITH ANESTHESIA [name] 07/22/2019   Procedure: STENTING;  Surgeon: Dolphus Carrion, MD;  [facility];  Service: Radiology;  Laterality: [name];   TOTAL SHOULDER ARTHROPLASTY Right 2011   Dr. Dozier COWBOY NERVE TRANSPOSITION Left 02/02/2016   Procedure: LEFT IN-SITU DECOMPRESSION ULNAR NERVE ;  Surgeon: Arley Curia, MD;  Location: [facility];  Service: Orthopedics;  Laterality: Left;   ULNAR TUNNEL RELEASE Left 02/02/2016   Procedure: LEFT CUBITAL TUNNEL RELEASE;  Surgeon: Arley Curia, MD;  Location: [facility];  Service: Orthopedics;  Laterality: Left;   VASECTOMY  1971   VEIN HARVEST Left 08/16/2017   Procedure: USING NON REVERSE LEFT GREATER SAPHENOUS VEIN HARVEST;  Surgeon: Serene Gaile ORN, MD;  [facility];  Service: Vascular;  Laterality: Left;   VIDEO BRONCHOSCOPY WITH ENDOBRONCHIAL NAVIGATION [name] 07/30/2024   Procedure: VIDEO BRONCHOSCOPY WITH ENDOBRONCHIAL NAVIGATION;  Surgeon: Kerrin Elspeth BROCKS, MD;  [facility]OS[facility];  Service: Thoracic;  Laterality: [name];   VIDEO BRONCHOSCOPY WITH ENDOBRONCHIAL ULTRASOUND [name] 07/30/2024   Procedure: BRONCHOSCOPY, WITH EBUS;  Surgeon: Kerrin Elspeth BROCKS, MD;  Location: [facility] ENDOSCOPY;  Service: Thoracic;  Laterality: [name];  ROBOTIC BRONCHOSCOPY WITH EBUS    Home Medications:  Allergies as of 09/02/2024   No Known Allergies      Medication List        Accurate as of September 02, 2024  9:13 AM. If you have any questions, ask your nurse or doctor.          aspirin  EC 81 MG tablet Take 81 mg by mouth every evening.   atorvastatin  20 MG tablet Commonly known as: LIPITOR  Take 20 mg by mouth every evening.   Brilinta  90 MG Tabs tablet Generic drug: ticagrelor  Take 90 mg by mouth 2 (two) times daily.   carvedilol  6.25 MG tablet Commonly known as: COREG  TAKE 1 TABLET TWICE A DAY WITH MEALS (DOSE CHANGE, NEW PRESCRIPTION)   CENTRUM SILVER PO Take 1 tablet by mouth daily.  50+   cetirizine 10 MG tablet Commonly known as: ZYRTEC Take 10 mg by mouth daily.   CLEAR EYES OP Place 1 drop into both eyes daily as needed (dry eyes).   clotrimazole -betamethasone  cream Commonly known as: LOTRISONE  Apply 1 Application topically 2 (two) times daily.   dextromethorphan -guaiFENesin  30-600 MG 12hr tablet Commonly known as: MUCINEX  DM Take 1 tablet by mouth 2 (two) times daily.   Fish Oil  1000 MG Caps Take 1,000 mg by mouth every evening. What changed: when to take this   gabapentin  300 MG capsule Commonly known as: NEURONTIN  Take 600 mg by mouth  2 (two) times daily.   Gold Bond Psoriasis Relief 3 % Crea Generic drug: Salicylic Acid Apply 1 application topically daily as needed (psoriasis).   HYDROcodone -acetaminophen  5-325 MG tablet Commonly known as: NORCO/VICODIN Take 1-2 tablets by mouth See admin instructions. Take 2 tablets in the morning and evening, may take 1 tablet at lunch as needed for pain   HYDROcodone -acetaminophen  10-325 MG tablet Commonly known as: NORCO Take 1 tablet by mouth at bedtime.   Magnesium  250 MG Tabs Take 250 mg by mouth every evening.   methocarbamol 500 MG tablet Commonly known as: ROBAXIN Take 1 tablet (500 mg total) by mouth every 8 (eight) hours as needed for muscle spasms.   methocarbamol 500 MG tablet Commonly known as: ROBAXIN Take 1 tablet (500 mg total) by mouth 4 (four) times daily.   Neuriva Plus Caps Take 1 capsule by mouth daily.   sacubitril -valsartan  97-103 MG Commonly known as: Entresto  Take 1 tablet by mouth 2 (two) times daily.   silodosin 8 MG Caps capsule Commonly known as: RAPAFLO Take 8 mg by mouth daily with breakfast.   SUPER B COMPLEX/C PO Take 1 tablet by mouth every evening.   tamsulosin  0.4 MG Caps capsule Commonly known as: FLOMAX  Take 0.4 mg by mouth daily after supper.   traMADol  50 MG tablet Commonly known as: ULTRAM  Take 50 mg by mouth 2 (two) times daily as needed for  moderate pain (pain score 4-6).        Allergies: No Known Allergies  Family History: Family History  Problem Relation Age of Onset   Coronary artery disease Mother    Diabetes Mother    Heart disease Mother        Before age 22 - 5 Bypasses   Hypertension Mother    Heart attack Mother        3-4 Heart attacks   Alzheimer's disease Father    Diabetes Father    Heart attack Sister    Cancer Brother        Prostate   Hyperlipidemia Son    Prostate cancer Brother    Alzheimer's disease Sister    Colon cancer Neg Hx     Social History:  reports that he has been smoking cigarettes. He started smoking about 67 years ago. He has a 67 pack-year smoking history. He has been exposed to tobacco smoke. He has never used smokeless tobacco. He reports that he does not drink alcohol and does not use drugs.  ROS: All other review of systems were reviewed and are negative except what is noted above in HPI  Physical Exam: BP (!) 166/71   Pulse 67   Constitutional:  Alert and oriented, No acute distress. HEENT: [place] AT, moist mucus membranes.  Trachea midline, no masses. Cardiovascular: No clubbing, cyanosis, or edema. Respiratory: Normal respiratory effort, no increased work of breathing. GI: Abdomen is soft, nontender, nondistended, no abdominal masses GU: No CVA tenderness.  Lymph: No cervical or inguinal lymphadenopathy. Skin: No rashes, bruises or suspicious lesions. Neurologic: Grossly intact, no focal deficits, moving all 4 extremities. Psychiatric: Normal mood and affect.  Laboratory Data: Lab Results  Component Value Date   WBC 13.7 (H) 08/21/2024   HGB 13.1 08/21/2024   HCT 38.4 (L) 08/21/2024   MCV 90.4 08/21/2024   PLT 236 08/21/2024    Lab Results  Component Value Date   CREATININE 0.73 08/21/2024    Lab Results  Component Value Date   PSA 1.37 01/08/2012   PSA  1.02 Test Methodology: Hybritech PSA 10/06/2008    No results found for: TESTOSTERONE  Lab  Results  Component Value Date   HGBA1C (H) 10/18/2010    5.9 (NOTE)                                                                       According to the ADA Clinical Practice Recommendations for 2011, when HbA1c is used as a screening test:   >=6.5%   Diagnostic of Diabetes Mellitus           (if abnormal result  is confirmed)  5.7-6.4%   Increased risk of developing Diabetes Mellitus  References:Diagnosis and Classification of Diabetes Mellitus,Diabetes Care,2011,34(Suppl 1):S62-S69 and Standards of Medical Care in         Diabetes - 2011,Diabetes Care,2011,34  (Suppl 1):S11-S61.    Urinalysis    Component Value Date/Time   COLORURINE YELLOW 08/12/2024 1408   APPEARANCEUR CLEAR 08/12/2024 1408   APPEARANCEUR Clear 07/20/2024 0943   LABSPEC 1.009 08/12/2024 1408   PHURINE 6.0 08/12/2024 1408   GLUCOSEU NEGATIVE 08/12/2024 1408   HGBUR NEGATIVE 08/12/2024 1408   BILIRUBINUR NEGATIVE 08/12/2024 1408   BILIRUBINUR Negative 07/20/2024 0943   KETONESUR NEGATIVE 08/12/2024 1408   PROTEINUR NEGATIVE 08/12/2024 1408   UROBILINOGEN 0.2 06/10/2014 0830   NITRITE NEGATIVE 08/12/2024 1408   LEUKOCYTESUR NEGATIVE 08/12/2024 1408    Lab Results  Component Value Date   LABMICR Comment 07/20/2024   BACTERIA NONE SEEN 07/22/2019    Pertinent Imaging:  Results for orders placed during the hospital encounter of 03/18/14  DG Abd 1 View - KUB  Narrative CLINICAL DATA:  Agile capsule follow up.  EXAM: ABDOMEN - 1 VIEW  COMPARISON:  Abdominal CTA 02/17/2014.  FINDINGS: The bowel gas pattern is normal. Rectangular radiodensity in the left upper quadrant is just below the hemidiaphragm and is likely within the splenic flexure of the colon, although could be in the gastric fundus. Lower lumbar spondylosis is noted status post L4-5 fusion. Aortoiliac atherosclerosis is noted.  IMPRESSION: Capsule is in the left subphrenic region.   Electronically Signed By: Zell Perone M.D. On:  03/19/2014 09:36  No results found for this or any previous visit.  No results found for this or any previous visit.  No results found for this or any previous visit.  Results for orders placed during the hospital encounter of 12/04/21  US  RENAL  Narrative CLINICAL DATA:  Flank pain  Difficulty urinating  Cirrhosis  CHF  Hypertension  EXAM: RENAL / URINARY TRACT ULTRASOUND COMPLETE  COMPARISON:  1122  FINDINGS: Right Kidney:  Renal measurements: 12.2 x 4.3 x 4.9 cm = volume: 132 mL. Echogenicity within normal limits. No mass or hydronephrosis visualized.  Left Kidney:  Renal measurements: 11.4 x 4.8 x 5.0 cm = volume: 143 mL. Echogenicity within normal limits. No mass or hydronephrosis visualized.  Bladder:  Small amount of layering debris noted within the bladder. Bladder otherwise normal appearance.  Other:  None.  IMPRESSION: 1. No significant sonographic abnormality of the kidneys. 2. Small amount of layering debris noted within the bladder. Please correlate for possibility of cystitis.   Electronically Signed By: Aliene Lloyd M.D. On: 12/04/2021 09:20  No results found for  this or any previous visit.  No results found for this or any previous visit.  No results found for this or any previous visit.   Assessment & Plan:    1. Phimosis (Primary) The risks/benefits/alterantives to circumcision was explained to the patient and he understands and wishes to proceed with surgery   No follow-ups on file.  Belvie Clara, MD  [facility] [place]

## 2024-09-02 NOTE — Patient Instructions (Signed)
 Visit Information  Thank you for taking time to visit with me today. Please don't hesitate to contact me if I can be of assistance to you before our next scheduled telephone appointment.  Our next appointment is by telephone on 09/09/24 at 1130 am  Following is a copy of your care plan:   Goals Addressed             This Visit's Progress    VBCI Transitions of Care (TOC) Care Plan       Problems:  Recent Hospitalization for treatment of Right lower lobe lobectomy Knowledge Deficit Related to Right lower lobe lobectomy and No Hospital Follow Up Provider appointment Patient to call PCP  Goal:  Over the next 30 days, the patient will not experience hospital readmission  Interventions:  Transitions of Care: Doctor Visits  - discussed the importance of doctor visits Post-op wound/incision care reviewed with patient/caregiver Reviewed Signs and symptoms of infection Reviewed pain control as patient having some increased pain from surgical site.  Robaxin ordered by Dr. Kerrin.   Patient Self Care Activities:  Attend all scheduled provider appointments Call pharmacy for medication refills 3-7 days in advance of running out of medications Call provider office for new concerns or questions  Notify RN Care Manager of TOC call rescheduling needs Participate in Transition of Care Program/Attend TOC scheduled calls Perform all self care activities independently  Take medications as prescribed   Monitor for swelling, redness, pain, pus, and fever Keep wound clean and dry.   Notify physician immediately for any changes  Use relaxation  techniques such as deep breathing to control pain.   Plan:  The patient has been provided with contact information for the care management team and has been advised to call with any health related questions or concerns.         Patient verbalizes understanding of instructions and care plan provided today and agrees to view in MyChart. Active  MyChart status and patient understanding of how to access instructions and care plan via MyChart confirmed with patient.     The patient has been provided with contact information for the care management team and has been advised to call with any health related questions or concerns.   Please call the care guide team at [phone] if you need to cancel or reschedule your appointment.   Please call the Suicide and Crisis Lifeline: 988 if you are experiencing a Mental Health or Behavioral Health Crisis or need someone to talk to.  Macarthur Lorusso J. Bricyn Labrada RN, MSN [facility], [facility] RN Care Manager Direct Dial: [phone]  Fax: [phone] Website: [email or web address]

## 2024-09-02 NOTE — Patient Instructions (Signed)
 Circumcision, Adult, Care After After a circumcision, it is common for the area around your cut from surgery (incision) to have: Redness. Swelling. Soreness. Follow these instructions at home: Your doctor may give you more instructions. If you have problems, contact your doctor. Medicines Take or use over-the-counter and prescription medicines only as told by your doctor. If you were prescribed an antibiotic medicine, use it as told by your doctor. Do not stop using it even if you start to feel better. Bathing Do not get your incision area wet for 24 hours after the procedure, or as long as told by your doctor. Do not take baths, swim, or use a hot tub. Ask your doctor about taking showers or sponge baths. When you can shower, do not rub the incision area. Gently pat it dry. Incision care  Follow instructions from your doctor about how to take care of your incision. Make sure you: Wash your hands with soap and water  for at least 20 seconds before and after you change your bandage (dressing). If you cannot use soap and water , use hand sanitizer. Change your dressing. Leave the stitches alone. They will disappear over time. Check your incision area every day for signs of infection. Check for: More redness, swelling, or pain. More fluid or blood. Warmth. Pus or a bad smell. Managing pain and swelling If told, put ice on the painful area. To do this: Put ice in a plastic bag. Place a towel between your skin and the bag. Leave the ice on for 20 minutes, 2-3 times a day. Take off the ice if your skin turns bright red. This is very important. If you cannot feel pain, heat, or cold, you have a greater risk of damage to the area.  Activity  If you were given a sedative during your procedure, do not drive or use machines until your doctor says that it is safe. A sedative is a medicine that helps you relax. You may have to avoid lifting. Ask your doctor how much you can safely lift. Do not  have sex until your doctor says it is okay. Rest as told by your doctor. Return to your normal activities when your doctor says that it is safe. Avoid contact sports and biking until your doctor says it is okay. General instructions Do not smoke or use any products that contain nicotine  or tobacco. These can make it take longer for your incision to heal. If you need help quitting, ask your doctor. Drink enough fluid to keep your pee (urine) pale yellow. Keep all follow-up visits. Contact a doctor if: You have a fever. Medicine does not help your pain. You have any of these signs of infection around your incision: More redness, swelling, or pain. More fluid or blood. Warmth. Pus or a bad smell. Your incision breaks open. Get help right away if: You cannot pee, or it hurts to pee. There is redness, swelling, and soreness that spreads up the shaft of your penis, your thighs, or your lower belly (abdomen). You have bleeding that does not stop when you press on it. Summary After the procedure, it is common to have redness, swelling, and soreness around your cut from surgery (incision). Follow instructions from your doctor about how to take care of your incision. Check your incision area every day for signs of infection. Do not have sex until your doctor says it is okay. This information is not intended to replace advice given to you by your health care provider. Make sure  you discuss any questions you have with your health care provider. Document Revised: 11/13/2021 Document Reviewed: 11/13/2021 Elsevier Patient Education  2024 [employer].

## 2024-09-02 NOTE — Transitions of Care (Post Inpatient/ED Visit) (Signed)
 Transition of Care week 2  Visit Note  09/02/2024  Name: Roy [name] MRN: [id number]          DOB: 29-Jun-1946  Situation: Patient enrolled in [facility] 30-day program. Visit completed with patient by telephone.   Background:   Initial Transition Care Management Follow-up Telephone Call    Past Medical History:  Diagnosis Date   AICD (automatic cardioverter/defibrillator) present 08/19/2015   St Jude BiV ICD for primary prevention by Dr. Waddell   Alcohol abuse    6 beers per day; hospital admission in 2009 for withdrawal symptoms   Anxiety and depression    denies    Cerebrovascular disease 2009   TIA; 2009- right ICA stent; re-intervention for restenosis complicated by [facility] w/o sx   CHF (congestive heart failure) (HCC) 06/02/2014   Chronic obstructive pulmonary disease (HCC)    Degenerative joint disease    Total shoulder arthroplasty-right   Hyperlipidemia    Hypertension    LBBB (left bundle branch block)    Normal echo-2011; stress nuclear in 09/2010--septal hypoperfusion representing nontransmural infarction or the effect of left bundle branch block, no ischemia   Lung cancer (HCC)    Myocardial infarction (HCC) 06/02/2014   Massive Heart Attack   Peripheral vascular disease    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  Oxygen @ 2liter continuous. Respiratory Management Strategies: Oxygen  therapy Respiratory Self-Management Outcome: 4 (good)  Endocrine Endocrine Symptoms Reported: No symptoms reported    Gastrointestinal Gastrointestinal Symptoms Reported: No symptoms reported Gastrointestinal Comment: Reviewed bowel management with use of opoids    Genitourinary Genitourinary Symptoms Reported: No symptoms reported    Integumentary Integumentary Symptoms Reported: Incision Additional Integumentary Details: Puncture sites to right side of chest continue to heal. Skin Management Strategies: Routine screening Skin Self-Management Outcome: 4 (good) Skin Comment: Advised patient to : Monitor for swelling, redness, pain, pus, and fever  Keep wound clean and dry.    Notify physician immediately for any changes  Musculoskeletal Musculoskelatal Symptoms Reviewed: No symptoms reported        Psychosocial Psychosocial Symptoms Reported: No symptoms reported         There were no vitals filed for this visit.  Medications Reviewed Today     Reviewed by Sahil Milner, RN (Case Manager) on 09/02/24 at 1126  Med List Status: <None>   Medication Order Taking? Sig Documenting Provider Last Dose Status Informant  aspirin  EC 81 MG tablet [id number]  Take 81 mg by mouth every evening.  [provider]  Active Self  atorvastatin  (LIPITOR ) 20 MG tablet [id number]  Take 20 mg by mouth every evening. [provider]  Active Self  BRILINTA  90 MG TABS tablet [id number]  Take 90 mg by mouth 2 (two) times daily. [provider]  Active Self  carvedilol  (COREG ) 6.25 MG tablet [id number]  TAKE 1 TABLET TWICE A DAY WITH MEALS (DOSE  CHANGE, NEW PRESCRIPTION) Waddell Danelle ORN, MD  Active   cetirizine (ZYRTEC) 10 MG tablet [id number]  Take 10 mg by mouth daily. [provider]  Active Self  clotrimazole -betamethasone  (LOTRISONE ) cream [id number]  Apply 1 Application topically 2 (two) times daily. McKenzie, Belvie CROME, MD  Active   Cobalamin Combinations (NEURIVA PLUS)  CAPS [id number]  Take 1 capsule by mouth daily. [provider]  Active Self  dextromethorphan -guaiFENesin  (MUCINEX  DM) 30-600 MG 12hr tablet [id number] Yes Take 1 tablet by mouth 2 (two) times daily. [provider]  Active Self  gabapentin  (NEURONTIN ) 300 MG capsule [id number] Yes Take 600 mg by mouth 2 (two) times daily. [provider]  Active Self  HYDROcodone -acetaminophen  (NORCO) 10-325 MG tablet [id number] Yes Take 1 tablet by mouth at bedtime. [provider]  Active   HYDROcodone -acetaminophen  (NORCO/VICODIN) 5-325 MG tablet [id number] Yes Take 1-2 tablets by mouth See admin instructions. Take 2 tablets in the morning and evening, may take 1 tablet at lunch as needed for pain [provider]  Active Self  Magnesium  250 MG TABS [id number] Yes Take 250 mg by mouth every evening. [provider]  Active Self  methocarbamol (ROBAXIN) 500 MG tablet [id number] Yes Take 1 tablet (500 mg total) by mouth every 8 (eight) hours as needed for muscle spasms. Kerrin Elspeth BROCKS, MD  Active   methocarbamol (ROBAXIN) 500 MG tablet [id number] Yes Take 1 tablet (500 mg total) by mouth 4 (four) times daily. Kerrin Elspeth BROCKS, MD  Active   Multiple Vitamins-Minerals (CENTRUM SILVER PO) [id number] Yes Take 1 tablet by mouth daily. 50+ [provider]  Active Self  Naphazoline HCl (CLEAR EYES OP) [id number] Yes Place 1 drop into both eyes daily as needed (dry eyes). [provider]  Active Self  Omega-3 Fatty Acids (FISH OIL ) 1000 MG CAPS [id number] Yes Take 1,000 mg by mouth every evening.  Patient taking differently: Take 1,000 mg by mouth 2 (two) times daily.   [provider]  Active Self  sacubitril -valsartan  (ENTRESTO ) 97-103 MG [id number] Yes Take 1 tablet by mouth 2 (two) times daily. Alvan Dorn FALCON, MD  Active   Salicylic Acid (GOLD BOND PSORIASIS RELIEF) 3 % CREA [id number] Yes Apply 1 application topically daily as needed  (psoriasis). [provider]  Active Self  silodosin (RAPAFLO) 8 MG CAPS capsule [id number] Yes Take 8 mg by mouth daily with breakfast. [provider]  Active Self  SUPER B COMPLEX/C PO [id number] Yes Take 1 tablet by mouth every evening. [provider]  Active Self  tamsulosin  (FLOMAX ) 0.4 MG CAPS capsule [id number] Yes Take 0.4 mg by mouth daily after supper. [provider]  Active Self  traMADol  (ULTRAM ) 50 MG tablet [id number] Yes Take 50 mg by mouth 2 (two) times daily as needed for moderate pain (pain score 4-6). [provider]  Active Self           Med Note ROLENE STALLION, SUSAN A   Tue Jul 28, 2024  2:10 PM)              Goals Addressed             This Visit's Progress    VBCI Transitions of Care (TOC) Care Plan       Problems:  Recent Hospitalization for treatment of Right lower lobe lobectomy Knowledge Deficit Related to Right lower lobe lobectomy and No Hospital Follow Up Provider appointment Patient to call PCP  Goal:  Over the next 30  days, the patient will not experience hospital readmission  Interventions:  Transitions of Care: Doctor Visits  - discussed the importance of doctor visits Post-op wound/incision care reviewed with patient/caregiver Reviewed Signs and symptoms of infection Reviewed pain control as patient having some increased pain from surgical site.  Robaxin ordered by Dr. Kerrin.   Patient Self Care Activities:  Attend all scheduled provider appointments Call pharmacy for medication refills 3-7 days in advance of running out of medications Call provider office for new concerns or questions  Notify RN Care Manager of TOC call rescheduling needs Participate in Transition of Care Program/Attend TOC scheduled calls Perform all self care activities independently  Take medications as prescribed   Monitor for swelling, redness, pain, pus, and fever Keep wound clean and dry.   Notify physician  immediately for any changes  Use relaxation  techniques such as deep breathing to control pain.   Plan:  The patient has been provided with contact information for the care management team and has been advised to call with any health related questions or concerns.         Recommendation:   PCP Follow-up Continue Current Plan of Care  Follow Up Plan:   Telephone follow-up in 1 week   Tine Mabee J. Mayur Duman RN, MSN [facility], [facility] RN Care Manager Direct Dial: [phone]  Fax: [phone] Website: [email or web address]

## 2024-09-03 ENCOUNTER — Telehealth: Payer: Self-pay

## 2024-09-03 ENCOUNTER — Other Ambulatory Visit: Payer: Self-pay

## 2024-09-03 NOTE — Progress Notes (Signed)
 The proposed treatment discussed in conference is for discussion purpose only and is not a binding recommendation.  The patients have not been physically examined, or presented with their treatment options.  Therefore, final treatment plans cannot be decided.

## 2024-09-03 NOTE — Telephone Encounter (Signed)
 Patient was contacted to make aware that he does not have current home health with [facility] to assess home O2 removal. Advised that he could come into the office to have his O2 checked off oxygen. He states that he has been without oxygen today and his O2 sats are 95%. Feels fine. Advised that if he needs the oxygen to use as needed for now and can wean himself off. If he needs to come in for O2 test he can. Patient acknowledged receipt.

## 2024-09-08 ENCOUNTER — Telehealth: Payer: Self-pay

## 2024-09-08 NOTE — Telephone Encounter (Signed)
 1st attempt to reach pt regarding surgical clearance and the need for an IN OFFICE appointment.  Left pt a detailed message to call back and get that scheduled.

## 2024-09-08 NOTE — Telephone Encounter (Signed)
   Pre-operative Risk Assessment    Patient Name: CURRY SEEFELDT  DOB: [birth year] MRN: [id number]   Date of last office visit: 06/25/24 Date of next office visit: 12/25/24   Request for Surgical Clearance    Procedure:  Circumcision   Date of Surgery:  Clearance 10/01/24                                 Surgeon:  Dr. Sherrilee  Surgeon's Group or Practice Name:  [facility]  Phone number:  [phone] Fax number:  [phone]   Type of Clearance Requested:   - Medical  - Pharmacy:  Hold Aspirin  and Ticagrelor  (Brilinta ) Aspirin  5 days prior and Brillinta 3 days prior    Type of Anesthesia:  General    Additional requests/questions:    Bonney Rebeca Blight   09/08/2024, 3:39 PM

## 2024-09-08 NOTE — Telephone Encounter (Signed)
   Name: Roy [name]  DOB: 06/22/1946  MRN: [id number]  Primary Cardiologist: Alvan Carrier, MD  Chart reviewed as part of pre-operative protocol coverage. Because of [name]'s past medical history and time since last visit, he will require a follow-up in-office visit in order to better assess preoperative cardiovascular risk.  Pre-op covering staff: - Please schedule appointment and call patient to inform them. If patient already had an upcoming appointment within acceptable timeframe, please add pre-op clearance to the appointment notes so provider is aware. - Please contact requesting surgeon's office via preferred method (i.e, phone, fax) to inform them of need for appointment prior to surgery.  Previously consulted Dr. Mallipeddi via secure chat regarding holding time of Brilinta  (back in July 2025). Pt is on Brilinta  d/t hx of stenting r/t to his PAD and Dr. Stacia recommended Vascular Surgery would need to provide clearance. Okay to hold ASA 5 days prior and resume when medically safe to do so as long as no new symptoms.   [name], PA-C  09/08/2024, 4:35 PM

## 2024-09-09 ENCOUNTER — Other Ambulatory Visit: Payer: Self-pay

## 2024-09-09 NOTE — Transitions of Care (Post Inpatient/ED Visit) (Signed)
 Transition of Care week 3  Visit Note  09/09/2024  Name: Roy [name] MRN: [id number]          DOB: 08/04/1946  Situation: Patient enrolled in [facility] 30-day program. Visit completed with patient by telephone.   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 Waddell   Alcohol abuse    6 beers per day; hospital admission in 2009 for withdrawal symptoms   Anxiety and depression    denies    Cerebrovascular disease 2009   TIA; 2009- right ICA stent; re-intervention for restenosis complicated by 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   Thrombocytopenia    Tobacco abuse    -100 pack years; 1.5 packs per day   Traumatic seroma of thigh    left   Tubular adenoma of colon     Assessment: Patient Reported Symptoms: Cognitive Cognitive Status: Alert and oriented to person, place, and time, Normal speech and language skills      Neurological Neurological Review of Symptoms: No symptoms reported    HEENT HEENT Symptoms Reported: No symptoms reported      Cardiovascular Cardiovascular Symptoms Reported: No symptoms reported    Respiratory Respiratory  Symptoms Reported: No symptoms reported Additional Respiratory Details: Righ lower lobe lobectomy. Now only using Oxygen at [facility] at 2 liters. Respiratory Management Strategies: Oxygen therapy Respiratory Self-Management Outcome: 4 (good)  Endocrine Endocrine Symptoms Reported: No symptoms reported    Gastrointestinal Gastrointestinal Symptoms Reported: No symptoms reported      Genitourinary Genitourinary Symptoms Reported: No symptoms reported    Integumentary Integumentary Symptoms Reported: Incision Additional Integumentary Details: Puncture sites contonue to heal. No signs of infection.  Reiterated signs of infection Skin Management Strategies: Routine screening Skin Self-Management Outcome: 4 (good)  Musculoskeletal Musculoskelatal Symptoms Reviewed: No symptoms reported        Psychosocial Psychosocial Symptoms Reported: No symptoms reported         There were no vitals filed for this visit.  Medications Reviewed Today     Reviewed by Keyonte Cookston, RN (Case Manager) on 09/09/24 at 1138  Med List Status: <None>   Medication Order Taking? Sig Documenting Provider Last Dose Status Informant  aspirin  EC 81 MG tablet [id number] Yes Take 81 mg by mouth every evening.  [provider]  Active Self  atorvastatin  (LIPITOR ) 20 MG tablet [id number] Yes Take 20 mg by mouth every evening. [provider]  Active Self  BRILINTA  90 MG TABS tablet [id number] Yes Take 90 mg by mouth 2 (two) times daily. [provider]  Active Self  carvedilol  (COREG ) 6.25 MG tablet [id number] Yes TAKE 1 TABLET TWICE A DAY WITH MEALS (DOSE CHANGE, NEW PRESCRIPTION) Waddell Danelle ORN, MD  Active   cetirizine (ZYRTEC) 10 MG tablet [id number] Yes Take 10 mg by mouth daily. [provider]  Active Self  clotrimazole -betamethasone  (LOTRISONE ) cream [id number] Yes Apply 1 Application topically 2 (two) times daily. McKenzie, Belvie CROME, MD  Active   Cobalamin Combinations (NEURIVA PLUS)  CAPS [id number] Yes Take 1 capsule by mouth daily. [provider]  Active Self  dextromethorphan -guaiFENesin  (MUCINEX  DM) 30-600 MG 12hr tablet [id number] Yes Take 1 tablet by mouth 2 (two) times daily. [provider]  Active Self  gabapentin  (NEURONTIN ) 300 MG capsule [id number] Yes Take 600 mg by mouth 2 (two) times daily. [provider]  Active Self  HYDROcodone -acetaminophen  (NORCO) 10-325 MG tablet [id number] Yes Take 1 tablet by mouth at bedtime. [provider]  Active   HYDROcodone -acetaminophen  (NORCO/VICODIN) 5-325 MG tablet [id number] Yes Take 1-2 tablets by mouth See admin instructions. Take 2 tablets in the morning and evening, may take 1 tablet at lunch as needed for pain [provider]  Active Self  Magnesium  250 MG TABS [id number] Yes Take 250 mg by mouth every evening. [provider]  Active Self  methocarbamol (ROBAXIN) 500 MG tablet [id number] Yes Take 1 tablet (500 mg total) by mouth every 8 (eight) hours as needed for muscle spasms. Kerrin Elspeth BROCKS, MD  Active   methocarbamol (ROBAXIN) 500 MG tablet [id number] Yes Take 1 tablet (500 mg total) by mouth 4 (four) times daily. Kerrin Elspeth BROCKS, MD  Active   Multiple Vitamins-Minerals (CENTRUM SILVER PO) [id number] Yes Take 1 tablet by mouth daily. 50+ [provider]  Active Self  Naphazoline HCl (CLEAR EYES OP) [id number] Yes Place 1 drop into both eyes daily as needed (dry eyes). [provider]  Active Self  Omega-3 Fatty Acids (FISH OIL ) 1000 MG CAPS [id number] Yes Take 1,000 mg by mouth every evening.  Patient taking differently: Take 1,000 mg by mouth 2 (two) times daily.   [provider]  Active Self  sacubitril -valsartan  (ENTRESTO ) 97-103 MG [id number] Yes Take 1 tablet by mouth 2 (two) times daily. Alvan Dorn FALCON, MD  Active   Salicylic Acid (GOLD BOND PSORIASIS RELIEF) 3 % CREA [id number] Yes Apply 1 application topically daily as needed  (psoriasis). [provider]  Active Self  silodosin (RAPAFLO) 8 MG CAPS capsule [id number] Yes Take 8 mg by mouth daily with breakfast. [provider]  Active Self  SUPER B COMPLEX/C PO [id number] Yes Take 1 tablet by mouth every evening. [provider]  Active Self  tamsulosin  (FLOMAX ) 0.4 MG CAPS capsule [id number] Yes Take 0.4 mg by mouth daily after supper. [provider]  Active Self  traMADol  (ULTRAM ) 50 MG tablet [id number] Yes Take 50 mg by mouth 2 (two) times daily as needed for moderate pain (pain score 4-6). [provider]  Active Self           Med Note ROLENE STALLION, SUSAN A   Tue Jul 28, 2024  2:10 PM)              Goals Addressed             This Visit's Progress    VBCI Transitions of Care (TOC) Care Plan       Problems:  Recent Hospitalization for treatment of Right lower lobe lobectomy Knowledge Deficit Related to Right lower lobe lobectomy and No Hospital Follow Up Provider appointment Patient to call PCP  Goal:  Over the next 30 days, the patient will not experience hospital readmission  Interventions:  Transitions of Care: Doctor Visits  - discussed the importance of doctor visits Post-op wound/incision care reviewed with patient/caregiver Reviewed Signs and symptoms of infection Discussed pain control Reviewed pacing self with activity   Patient Self Care Activities:  Attend all scheduled provider appointments Call pharmacy for medication refills 3-7 days in advance of running out of medications Call provider office for new concerns or questions  Notify RN Care Manager of TOC call rescheduling needs Participate in Transition of Care Program/Attend TOC scheduled calls Perform all self care activities independently  Take medications as prescribed   Monitor for swelling, redness, pain, pus, and fever Keep wound clean and dry.   Notify physician immediately for any changes  Use relaxation  techniques  such as deep breathing to control pain.   Plan:  The patient has been provided with contact information for the care management team and has been advised to call with any health related questions or concerns.         Recommendation:   Continue Current Plan of Care  Follow Up Plan:   Telephone follow-up in 1 week  Berthe Oley J. Jenney Brester RN, MSN [facility], [facility] RN Care Manager Direct Dial: [phone]  Fax: [phone] Website: [email or web address]

## 2024-09-09 NOTE — Patient Instructions (Signed)
 Visit Information  Thank you for taking time to visit with me today. Please don't hesitate to contact me if I can be of assistance to you before our next scheduled telephone appointment.  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 Knowledge Deficit Related to Right lower lobe lobectomy and No Hospital Follow Up Provider appointment Patient to call PCP  Goal:  Over the next 30 days, the patient will not experience hospital readmission  Interventions:  Transitions of Care: Doctor Visits  - discussed the importance of doctor visits Post-op wound/incision care reviewed with patient/caregiver Reviewed Signs and symptoms of infection Discussed pain control Reviewed pacing self with activity   Patient Self Care Activities:  Attend all scheduled provider appointments Call pharmacy for medication refills 3-7 days in advance of running out of medications Call provider office for new concerns or questions  Notify RN Care Manager of TOC call rescheduling needs Participate in Transition of Care Program/Attend TOC scheduled calls Perform all self care activities independently  Take medications as prescribed   Monitor for swelling, redness, pain, pus, and fever Keep wound clean and dry.   Notify physician immediately for any changes  Use relaxation  techniques such as deep breathing to control pain.   Plan:  The patient has been provided with contact information for the care management team and has been advised to call with any health related questions or concerns.         Patient verbalizes understanding of instructions and care plan provided today and agrees to view in MyChart. Active MyChart status and patient understanding of how to access  instructions and care plan via MyChart confirmed with patient.     The patient has been provided with contact information for the care management team and has been advised to call with any health related questions or concerns.   Please call the care guide team at [phone] if you need to cancel or reschedule your appointment.   Please call the Suicide and Crisis Lifeline: 988 if you are experiencing a Mental Health or Behavioral Health Crisis or need someone to talk to.  Shulem Mader J. Rowynn Mcweeney RN, MSN [facility], [facility] RN Care Manager Direct Dial: [phone]  Fax: [phone] Website: [email or web address]

## 2024-09-09 NOTE — Telephone Encounter (Signed)
 Pt requesting a c/b before scheduling a office visit

## 2024-09-09 NOTE — Telephone Encounter (Signed)
 Pt tells me that he called back to schedule appt in office he was told a message left for him last night.   Pt has been scheduled 09/16/24 Josefa Beauvais, FNP @ 8:50.

## 2024-09-15 ENCOUNTER — Other Ambulatory Visit: Payer: Self-pay

## 2024-09-15 ENCOUNTER — Other Ambulatory Visit (HOSPITAL_COMMUNITY): Payer: Self-pay

## 2024-09-15 NOTE — Progress Notes (Unsigned)
 Cardiology Clinic Note   Patient Name: Roy [name] Date of Encounter: 09/16/2024  Primary Care Provider:  Shona Norleen PEDLAR, MD Primary Cardiologist:  Alvan Carrier, MD  Patient Profile    Roy [name] Clause 78 year old male presents to the clinic today for follow-up evaluation of his coronary artery disease and preoperative cardiac evaluation.  Past Medical History    Past Medical History:  Diagnosis Date   AICD (automatic cardioverter/defibrillator) present 08/19/2015   St Jude BiV ICD for primary prevention by Dr. Waddell   Alcohol abuse    6 beers per day; hospital admission in 2009 for withdrawal symptoms   Anxiety and depression    denies    Cerebrovascular disease 2009   TIA; 2009- right ICA stent; re-intervention for restenosis complicated by [facility] w/o sx   CHF (congestive heart failure) (HCC) 06/02/2014   Chronic obstructive pulmonary disease (HCC)    Degenerative joint disease    Total shoulder arthroplasty-right   Hyperlipidemia    Hypertension    LBBB (left bundle branch block)    Normal echo-2011; stress nuclear in 09/2010--septal hypoperfusion representing nontransmural infarction or the effect of left bundle branch block, no ischemia   Lung cancer (HCC)    Myocardial infarction (HCC) 06/02/2014   Massive Heart Attack   Peripheral vascular disease    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   Thrombocytopenia    Tobacco abuse    -100 pack years; 1.5 packs per day   Traumatic seroma of thigh    left   Tubular adenoma of colon    Past Surgical History:  Procedure Laterality Date   ABDOMINAL AORTAGRAM [name] 05/04/2014   Procedure: ABDOMINAL EZELLA;  Surgeon: Gaile LELON New, MD;  Location: [facility] CATH LAB;  Service: Cardiovascular;  Laterality: [name];   ABDOMINAL AORTOGRAM [name] 06/25/2017   Procedure: Abdominal Aortogram;  Surgeon: New Gaile LELON, MD;  [facility] INVASIVE CV LAB;  Service: Cardiovascular;  Laterality: [name];   ABDOMINAL AORTOGRAM  W/LOWER EXTREMITY [name] 04/22/2018   Procedure: ABDOMINAL AORTOGRAM W/LOWER EXTREMITY;  Surgeon: New Gaile LELON, MD;  [facility];  Service: Cardiovascular;  Laterality: [name];   ABDOMINAL AORTOGRAM W/LOWER EXTREMITY [name] 03/08/2020   Procedure: ABDOMINAL AORTOGRAM W/LOWER EXTREMITY;  Surgeon: New Gaile LELON, MD;  [facility];  Service: Cardiovascular;  Laterality: [name];   ABDOMINAL AORTOGRAM W/LOWER EXTREMITY Bilateral 08/23/2020   Procedure: ABDOMINAL AORTOGRAM W/LOWER EXTREMITY;  Surgeon: New Gaile LELON, MD;  [facility];  Service: Cardiovascular;  Laterality: Bilateral;   ABDOMINAL AORTOGRAM W/LOWER EXTREMITY [name] 10/31/2021   Procedure: ABDOMINAL AORTOGRAM W/LOWER EXTREMITY;  Surgeon: New Gaile LELON, MD;  [facility];  Service: Cardiovascular;  Laterality: [name];   ABDOMINAL AORTOGRAM W/LOWER EXTREMITY [name] 12/19/2021   Procedure: ABDOMINAL AORTOGRAM W/LOWER EXTREMITY;  Surgeon: New Gaile LELON, MD;  [facility];  Service: Cardiovascular;  Laterality: [name];   ABDOMINAL AORTOGRAM W/LOWER EXTREMITY Bilateral 02/19/2023   Procedure: ABDOMINAL AORTOGRAM W/LOWER EXTREMITY;  Surgeon: New Gaile LELON, MD;  [facility];  Service: Cardiovascular;  Laterality: Bilateral;   AGILE CAPSULE [name] 03/18/2014   Procedure: AGILE CAPSULE;  Surgeon: Margo [name] Haddock, MD;  Location: [facility];  Service: Endoscopy;  Laterality: [name];  7:30   APPLICATION OF WOUND VAC Left 09/03/2017   Procedure: APPLICATION OF WOUND VAC;  Surgeon: Laurence Redell LITTIE, MD;  Location: [facility] OR;  Service: Vascular;  Laterality: Left;   APPLICATION OF  WOUND VAC Right 04/07/2020   Procedure: PLACEMENT OF ANTIBIOTIC BEADS AND APPLICATION OF WOUND VAC;  Surgeon: Serene Gaile ORN, MD;  [facility] OR;  Service: Vascular;  Laterality: Right;   BACK SURGERY     BACTERIAL OVERGROWTH TEST [name] 05/24/2015   Procedure: BACTERIAL OVERGROWTH TEST;  Surgeon: Margo [name] Haddock, MD;  [facility]: [facility];  Service: Endoscopy;  Laterality: [name];  0700   BI-VENTRICULAR IMPLANTABLE CARDIOVERTER DEFIBRILLATOR  (CRT-D)  08/19/2015   BIV ICD GENERATOR CHANGEOUT [name] 11/11/2023   Procedure: BIV ICD GENERATOR CHANGEOUT;  Surgeon: Waddell Danelle ORN, MD;  Location: [facility] INVASIVE CV LAB;  Service: Cardiovascular;  Laterality: [name];   BRONCHIAL BIOPSY  07/30/2024   Procedure: BRONCHOSCOPY, WITH BIOPSY;  Surgeon: Kerrin Elspeth BROCKS, MD;  Location: [facility] ENDOSCOPY;  Service: Thoracic;;   BRONCHIAL NEEDLE ASPIRATION BIOPSY  07/30/2024   Procedure: BRONCHOSCOPY, WITH NEEDLE ASPIRATION BIOPSY;  Surgeon: Kerrin Elspeth BROCKS, MD;  Location: [facility] ENDOSCOPY;  Service: Thoracic;;   CARPAL TUNNEL RELEASE Left 02/02/2016   Procedure: LEFT CARPAL TUNNEL RELEASE;  Surgeon: Arley Curia, MD;  Location: [facility];  Service: Orthopedics;  Laterality: Left;  ANESTHESIA: IV REGIONAL UPPER ARM   CATARACT EXTRACTION W/PHACO Right 03/08/2015   Procedure: CATARACT EXTRACTION PHACO AND INTRAOCULAR LENS PLACEMENT (IOC);  Surgeon: Oneil Platts, MD;  Location: [facility] ORS;  Service: Ophthalmology;  Laterality: Right;  CDE:9.46   CATARACT EXTRACTION W/PHACO Left 03/22/2015   Procedure: CATARACT EXTRACTION PHACO AND INTRAOCULAR LENS PLACEMENT (IOC);  Surgeon: Oneil Platts, MD;  Location: [facility] ORS;  Service: Ophthalmology;  Laterality: Left;  CDE:5.80   COLONOSCOPY  08/22/09   Fields-(Tubular Adenoma)3-mm transverse polyp/4-mm polyp otherwise noraml/small internal hemorrhoids   COLONOSCOPY [name] 04/14/2014   DOQ:dfjoo internal hemorrhids/normal mocsa in the terminal iluem/left colonis redundant   COLONOSCOPY [name] 07/01/2024   Procedure: COLONOSCOPY;  Surgeon: Shaaron Lamar HERO, MD;  [facility];  Service: Endoscopy;  Laterality: [name];  900am, asa 3   COLONOSCOPY W/ POLYPECTOMY  2011   ENDARTERECTOMY FEMORAL Left 08/16/2017   Procedure: ENDARTERECTOMY LEFT PROFUNDA FEMORAL;  Surgeon: Serene Gaile ORN, MD;   Location: [facility] OR;  Service: Vascular;  Laterality: Left;   ENDARTERECTOMY FEMORAL Right 03/31/2020   Procedure: RIGHT FEMORAL ENDARTERECTOMY;  Surgeon: Serene Gaile ORN, MD;  [facility];  Service: Vascular;  Laterality: Right;   EP IMPLANTABLE DEVICE [name] 08/19/2015   Procedure: BiV ICD Insertion CRT-D;  Surgeon: Danelle ORN Waddell, MD;  Location: [facility] INVASIVE CV LAB;  Service: Cardiovascular;  Laterality: [name];   ESOPHAGOGASTRODUODENOSCOPY [name] 03/05/2014   SLF: 1. Stricture at the gastroesophagael junction 2. Small hiatal hernia 3. Moderate non-erosive gastritis and duodentitis. 4. No surce for Melena identified.    FEMORAL-POPLITEAL BYPASS GRAFT Left 08/16/2017   Procedure: LEFT  FEMORAL-POPLITEAL ARTERY  BYPASS GRAFT;  Surgeon: Serene Gaile ORN, MD;  [facility];  Service: Vascular;  Laterality: Left;   GIVENS CAPSULE STUDY [name] 03/30/2014   Procedure: GIVENS CAPSULE STUDY;  Surgeon: Margo [name] Haddock, MD;  [facility];  Service: Endoscopy;  Laterality: [name];  7:30   GROIN DEBRIDEMENT Right 04/07/2020   Procedure: INCISION AND DRAINAGE OF RIGHT GROIN;  Surgeon: Serene Gaile ORN, MD;  [facility];  Service: Vascular;  Laterality: Right;   I & D EXTREMITY Left 09/03/2017   Procedure: IRRIGATION AND DEBRIDEMENT EXTREMITY LEFT THIGH SEROMA;  Surgeon: Laurence Redell LITTIE, MD;  Location: [facility] OR;  Service: Vascular;  Laterality: Left;   INSERT /  REPLACE / REMOVE PACEMAKER     INSERTION OF ILIAC STENT  03/31/2020   Procedure: INSERTION OF RIGHT ILIAC ARTERY STENT AND RIGHT SUPERFICIAL FEMORAL ARTERY STENT;  Surgeon: Serene Gaile ORN, MD;  [facility];  Service: Vascular;;   INTERCOSTAL NERVE BLOCK Right 08/19/2024   Procedure: BLOCK, NERVE, INTERCOSTAL;  Surgeon: Kerrin Elspeth BROCKS, MD;  [facility];  Service: Thoracic;  Laterality: Right;   IR ANGIO INTRA EXTRACRAN SEL COM CAROTID INNOMINATE BILAT MOD SED  07/16/2017   IR ANGIO INTRA EXTRACRAN SEL COM CAROTID INNOMINATE BILAT MOD SED  06/12/2019    IR ANGIO INTRA EXTRACRAN SEL COM CAROTID INNOMINATE BILAT MOD SED  01/17/2021   IR ANGIO VERTEBRAL SEL SUBCLAVIAN INNOMINATE BILAT MOD SED  07/16/2017   IR ANGIO VERTEBRAL SEL SUBCLAVIAN INNOMINATE BILAT MOD SED  06/12/2019   IR ANGIO VERTEBRAL SEL SUBCLAVIAN INNOMINATE BILAT MOD SED  01/17/2021   IR RADIOLOGIST EVAL & MGMT  04/01/2017   IR RADIOLOGIST EVAL & MGMT  08/02/2017   IR TRANSCATH EXCRAN VERT OR CAR A STENT  07/22/2019   IR US  GUIDE VASC ACCESS RIGHT  06/12/2019   IR US  GUIDE VASC ACCESS RIGHT  01/17/2021   JOINT REPLACEMENT Right    Total Shoulder Replacement    LOBECTOMY, LUNG, ROBOT-ASSISTED, USING VATS Right 08/19/2024   Procedure: RIGHT LOWER LOBECTOMY, LUNG, ROBOT-ASSISTED, USING VATS;  Surgeon: Kerrin Elspeth BROCKS, MD;  [facility];  Service: Thoracic;  Laterality: Right;  ROBOTIC RIGHT LOWER LOBECTOMY   LOWER EXTREMITY ANGIOGRAPHY Bilateral 06/25/2017   Procedure: Lower Extremity Angiography;  Surgeon: Serene Gaile ORN, MD;  [facility];  Service: Cardiovascular;  Laterality: Bilateral;   LUMBAR FUSION  2010   LYMPH NODE BIOPSY Right 08/19/2024   Procedure: LYMPH NODE BIOPSY;  Surgeon: Kerrin Elspeth BROCKS, MD;  Location: [facility] OR;  Service: Thoracic;  Laterality: Right;   PATCH ANGIOPLASTY Right 03/31/2020   Procedure: PATCH ANGIOPLASTY USING XENOSURE BIOLOGIC PATCH 1cm x 14cm;  Surgeon: Serene Gaile ORN, MD;  Location: [facility] OR;  Service: Vascular;  Laterality: Right;   PERIPHERAL VASCULAR ATHERECTOMY Right 04/22/2018   Procedure: PERIPHERAL VASCULAR ATHERECTOMY;  Surgeon: Serene Gaile ORN, MD;  [facility];  Service: Cardiovascular;  Laterality: Right;  right superficial femoral   PERIPHERAL VASCULAR BALLOON ANGIOPLASTY Right 04/22/2018   Procedure: PERIPHERAL VASCULAR BALLOON ANGIOPLASTY;  Surgeon: Serene Gaile ORN, MD;  [facility];  Service: Cardiovascular;  Laterality: Right;  external iliac   PERIPHERAL VASCULAR BALLOON  ANGIOPLASTY Left 03/08/2020   Procedure: PERIPHERAL VASCULAR BALLOON ANGIOPLASTY;  Surgeon: Serene Gaile ORN, MD;  [facility];  Service: Cardiovascular;  Laterality: Left;  Common Femoral   PERIPHERAL VASCULAR BALLOON ANGIOPLASTY Left 08/23/2020   Procedure: PERIPHERAL VASCULAR BALLOON ANGIOPLASTY;  Surgeon: Serene Gaile ORN, MD;  [facility];  Service: Cardiovascular;  Laterality: Left;  common femoral   PERIPHERAL VASCULAR BALLOON ANGIOPLASTY Left 10/31/2021   Procedure: PERIPHERAL VASCULAR BALLOON ANGIOPLASTY;  Surgeon: Serene Gaile ORN, MD;  [facility];  Service: Cardiovascular;  Laterality: Left;  external iliac   PERIPHERAL VASCULAR INTERVENTION Right 03/08/2020   Procedure: PERIPHERAL VASCULAR INTERVENTION;  Surgeon: Serene Gaile ORN, MD;  [facility];  Service: Cardiovascular;  Laterality: Right;  SFA   PERIPHERAL VASCULAR INTERVENTION Right 08/23/2020   Procedure: PERIPHERAL VASCULAR INTERVENTION;  Surgeon: Serene Gaile ORN, MD;  [facility];  Service: Cardiovascular;  Laterality: Right;  external iliac   PERIPHERAL VASCULAR INTERVENTION Right 10/31/2021   Procedure: PERIPHERAL VASCULAR INTERVENTION;  Surgeon: Serene Gaile ORN, MD;  [facility];  Service: Cardiovascular;  Laterality: Right;  superficial femoral artery   PERIPHERAL VASCULAR INTERVENTION Left 12/19/2021   Procedure: PERIPHERAL VASCULAR INTERVENTION;  Surgeon: Serene Gaile ORN, MD;  [facility];  Service: Cardiovascular;  Laterality: Left;  stent lt ext iliac / pta common femoral   PERIPHERAL VASCULAR INTERVENTION  02/19/2023   Procedure: PERIPHERAL VASCULAR INTERVENTION;  Surgeon: Serene Gaile ORN, MD;  [facility];  Service: Cardiovascular;;   RADIOLOGY WITH ANESTHESIA [name] 07/01/2019   Procedure: STENTING;  Surgeon: Dolphus Carrion, MD;  [facility] OR;  Service: Radiology;  Laterality: [name];    RADIOLOGY WITH ANESTHESIA [name] 07/22/2019   Procedure: STENTING;  Surgeon: Dolphus Carrion, MD;  Location[facility] OR;  Service: Radiology;  Laterality: [name];   TOTAL SHOULDER ARTHROPLASTY Right 2011   Dr. Dozier COWBOY NERVE TRANSPOSITION Left 02/02/2016   Procedure: LEFT IN-SITU DECOMPRESSION ULNAR NERVE ;  Surgeon: Arley Curia, MD;  Location: [facility];  Service: Orthopedics;  Laterality: Left;   ULNAR TUNNEL RELEASE Left 02/02/2016   Procedure: LEFT CUBITAL TUNNEL RELEASE;  Surgeon: Arley Curia, MD;  Location: [facility];  Service: Orthopedics;  Laterality: Left;   VASECTOMY  1971   VEIN HARVEST Left 08/16/2017   Procedure: USING NON REVERSE LEFT GREATER SAPHENOUS VEIN HARVEST;  Surgeon: Serene Gaile ORN, MD;  [facility];  Service: Vascular;  Laterality: Left;   VIDEO BRONCHOSCOPY WITH ENDOBRONCHIAL NAVIGATION [name] 07/30/2024   Procedure: VIDEO BRONCHOSCOPY WITH ENDOBRONCHIAL NAVIGATION;  Surgeon: Kerrin Elspeth BROCKS, MD;  [facility] ENDOS[facility];  Service: Thoracic;  Laterality: [name];   VIDEO BRONCHOSCOPY WITH ENDOBRONCHIAL ULTRASOUND [name] 07/30/2024   Procedure: BRONCHOSCOPY, WITH EBUS;  Surgeon: Kerrin Elspeth BROCKS, MD;  Location: [facility];  Service: Thoracic;  Laterality: [name];  ROBOTIC BRONCHOSCOPY WITH EBUS    Allergies  No Known Allergies  History of Present Illness    Roy [name] has a PMH of coronary artery disease status post NSTEMI,HfimpEF, status post BiV ICD, HTN, hyperlipidemia, cerebrovascular disease/TIA, status post ICA stent, PAD, SFA stenting 3/24, tobacco abuse, former alcohol abuse, aortic dilation, COPD, and has mild mitral valve regurgitation.  Echocardiogram 5/24 showed an LVEF of 55-60%, G1 DD, trivial mitral valve regurgitation, borderline dilation of aortic root measuring 36 mm.  He had an NSTEMI in 2015 in the setting of respiratory failure and pneumonia.  His EF at that time was noted to be 20%.  Cardiac catheterization was not  pursued due to his renal function and altered mental status.  He had subsequent stress testing which showed cardiac scar.  He was placed on dual antiplatelet therapy for history of ICA stenting due to cardiovascular disease.  In 2018 his EF improved to 50-55% after ICD was placed.  He was not noted to have wall motion abnormalities.  He underwent bilateral lower extremity angiogram with right SFA stenting 3/24 by Dr. Serene.  During angiogram no significant stenosis was found through his iliacs, aorta, or left femoral popliteal bypass.  He was seen in follow-up by Almarie Crate, NP 7/24.  At that time he was doing well.  He continued to smoke.  His blood pressure was well-controlled.  He was seen again in follow-up by peck NP 12/27/2023.  He reported that he had ICD generator change out 12/24.  He was doing well.  He denied chest pain and other cardiac symptoms.  He continued to smoke.  His blood pressure continued to be well-controlled.  His weight was stable.  7/25 he again followed up with Miriam NP.  He reported that he had been diagnosed with lung cancer.  He was motivated to stop smoking.  He was scheduled for colonoscopy the following week.  He was doing well.  He denied chest pain and cardiac symptoms.  He presents to the clinic today for follow-up evaluation and preoperative cardiac evaluation.  He states he is doing well postoperatively.  He had right lower lobe resection for lung CA.  He reports that he was previously on oxygen.  Now he is using it intermittently as needed.  His blood pressure today initially is 158/60 and on recheck his 138/76.  We reviewed his upcoming surgery and previous clinic visits.  He expressed understanding.  I will continue his current medication regimen, give him salty 6 diet sheet, have him continue to increase physical activity as tolerated and plan follow-up in January as scheduled.  Today he denies chest pain, shortness of breath, lower extremity edema, fatigue,  palpitations, melena, hematuria, hemoptysis, diaphoresis, weakness, presyncope, syncope, orthopnea, and PND.    Home Medications    Prior to Admission medications   Medication Sig Start Date End Date Taking? Authorizing Provider  aspirin  EC 81 MG tablet Take 81 mg by mouth every evening.     [provider]  atorvastatin  (LIPITOR ) 20 MG tablet Take 20 mg by mouth every evening.    [provider]  BRILINTA  90 MG TABS tablet Take 90 mg by mouth 2 (two) times daily. 07/01/19   [provider]  carvedilol  (COREG ) 6.25 MG tablet TAKE 1 TABLET TWICE A DAY WITH MEALS (DOSE CHANGE, NEW PRESCRIPTION) 06/15/24   Waddell Danelle ORN, MD  cetirizine (ZYRTEC) 10 MG tablet Take 10 mg by mouth daily.    [provider]  clotrimazole -betamethasone  (LOTRISONE ) cream Apply 1 Application topically 2 (two) times daily. 07/20/24   McKenzie, Belvie CROME, MD  Cobalamin Combinations (NEURIVA PLUS) CAPS Take 1 capsule by mouth daily.    [provider]  dextromethorphan -guaiFENesin  (MUCINEX  DM) 30-600 MG 12hr tablet Take 1 tablet by mouth 2 (two) times daily.    [provider]  gabapentin  (NEURONTIN ) 300 MG capsule Take 600 mg by mouth 2 (two) times daily. 06/14/15   [provider]  HYDROcodone -acetaminophen  (NORCO) 10-325 MG tablet Take 1 tablet by mouth at bedtime.    [provider]  HYDROcodone -acetaminophen  (NORCO/VICODIN) 5-325 MG tablet Take 1-2 tablets by mouth See admin instructions. Take 2 tablets in the morning and evening, may take 1 tablet at lunch as needed for pain    [provider]  Magnesium  250 MG TABS Take 250 mg by mouth every evening.    [provider]  methocarbamol (ROBAXIN) 500 MG tablet Take 1 tablet (500 mg total) by mouth every 8 (eight) hours as needed for muscle spasms. 09/01/24   Kerrin Elspeth BROCKS, MD  methocarbamol (ROBAXIN) 500 MG tablet Take 1 tablet (500 mg total) by mouth 4 (four) times daily.  09/01/24   Kerrin Elspeth BROCKS, MD  Multiple Vitamins-Minerals (CENTRUM SILVER PO) Take 1 tablet by mouth daily. 50+    [provider]  Naphazoline HCl (CLEAR EYES OP) Place 1 drop into both eyes daily as needed (dry eyes).    [provider]  Omega-3 Fatty Acids (FISH OIL ) 1000 MG  CAPS Take 1,000 mg by mouth every evening. Patient taking differently: Take 1,000 mg by mouth 2 (two) times daily.    [provider]  sacubitril -valsartan  (ENTRESTO ) 97-103 MG Take 1 tablet by mouth 2 (two) times daily. 07/13/24   Alvan Dorn FALCON, MD  Salicylic Acid (GOLD BOND PSORIASIS RELIEF) 3 % CREA Apply 1 application topically daily as needed (psoriasis).    [provider]  silodosin (RAPAFLO) 8 MG CAPS capsule Take 8 mg by mouth daily with breakfast.    [provider]  SUPER B COMPLEX/C PO Take 1 tablet by mouth every evening.    [provider]  tamsulosin  (FLOMAX ) 0.4 MG CAPS capsule Take 0.4 mg by mouth daily after supper.    [provider]  traMADol  (ULTRAM ) 50 MG tablet Take 50 mg by mouth 2 (two) times daily as needed for moderate pain (pain score 4-6). 04/24/20   [provider]    Family History    Family History  Problem Relation Age of Onset   Coronary artery disease Mother    Diabetes Mother    Heart disease Mother        Before age 32 - 5 Bypasses   Hypertension Mother    Heart attack Mother        3-4 Heart attacks   Alzheimer's disease Father    Diabetes Father    Heart attack Sister    Cancer Brother        Prostate   Hyperlipidemia Son    Prostate cancer Brother    Alzheimer's disease Sister    Colon cancer Neg Hx    He indicated that his mother is deceased. He indicated that his father is deceased. He indicated that one of his two sisters is deceased. He indicated that one of his two brothers is deceased. He indicated that his daughter is alive. He indicated that his son is alive. He indicated that the  status of his neg hx is unknown.  Social History    Social History   Socioeconomic History   Marital status: Married    Spouse name: Rakan Soffer   Number of children: 2   Years of education: 12   Highest education level: 12th grade  Occupational History   Occupation: retired, Personnel officer    Comment: Architect: RETIRED  Tobacco Use   Smoking status: Some Days    Current packs/day: 1.00    Average packs/day: 1 pack/day for 67.1 years (67.1 ttl pk-yrs)    Types: Cigarettes    Start date: 08/20/1957    Passive exposure: Current   Smokeless tobacco: Never  Vaping Use   Vaping status: Never Used  Substance and Sexual Activity   Alcohol use: No    Alcohol/week: 0.0 standard drinks of alcohol    Comment: quit in July 2015   Drug use: No   Sexual activity: Not Currently  Other Topics Concern   Not on file  Social History Narrative   Accompanied by daughter Joen Oak   Lives w/ wife, daughter   Social Drivers of Health   Financial Resource Strain: Low Risk  (06/21/2022)   Overall Financial Resource Strain (CARDIA)    Difficulty of Paying Living Expenses: Not hard at all  Food Insecurity: No Food Insecurity (08/24/2024)   Hunger Vital Sign    Worried About Running Out of Food in the Last Year: Never true    Ran Out of Food in the Last Year: Never true  Transportation Needs: No Transportation Needs (08/24/2024)   PRAPARE - Administrator, Civil Service (Medical): No    Lack of Transportation (Non-Medical): No  Physical Activity: Inactive (06/21/2022)   Exercise Vital Sign    Days of Exercise per Week: 0 days    Minutes of Exercise per Session: 0 min  Stress: No Stress Concern Present (06/21/2022)   [employer] of Occupational Health - Occupational Stress Questionnaire    Feeling of Stress : Not at all  Social Connections: Moderately Integrated (08/19/2024)   Social Connection and Isolation Panel    Frequency of Communication with Friends  and Family: More than three times a week    Frequency of Social Gatherings with Friends and Family: More than three times a week    Attends Religious Services: More than 4 times per year    Active Member of [employer] or Organizations: No    Attends Banker Meetings: Never    Marital Status: Married  Catering manager Violence: Not At Risk (08/24/2024)   Humiliation, Afraid, Rape, and Kick questionnaire    Fear of Current or Ex-Partner: No    Emotionally Abused: No    Physically Abused: No    Sexually Abused: No     Review of Systems    General:  No chills, fever, night sweats or weight changes.  Cardiovascular:  No chest pain, dyspnea on exertion, edema, orthopnea, palpitations, paroxysmal nocturnal dyspnea. Dermatological: No rash, lesions/masses Respiratory: No cough, dyspnea Urologic: No hematuria, dysuria Abdominal:   No nausea, vomiting, diarrhea, bright red blood per rectum, melena, or hematemesis Neurologic:  No visual changes, wkns, changes in mental status. All other systems reviewed and are otherwise negative except as noted above.  Physical Exam    VS:  BP 138/76   Pulse 62   Ht 5' 3 (1.6 m)   Wt 148 lb (67.1 kg)   SpO2 95%   BMI 26.22 kg/m  , BMI Body mass index is 26.22 kg/m. GEN: Well nourished, well developed, in no acute distress. HEENT: normal. Neck: Supple, no JVD, carotid bruits, or masses. Cardiac: RRR, no murmurs, rubs, or gallops. No clubbing, cyanosis, edema.  Radials/DP/PT 2+ and equal bilaterally.  Respiratory:  Respirations regular and unlabored, clear to auscultation bilaterally.  Post right lower lobe resection absent lung sounds right lower lobe GI: Soft, nontender, nondistended, BS + x 4. MS: no deformity or atrophy. Skin: warm and dry, no rash. Neuro:  Strength and sensation are intact. Psych: Normal affect.  Accessory Clinical Findings    Recent Labs: 08/21/2024: ALT 15; BUN 11; Creatinine, Ser 0.73; Hemoglobin 13.1; Platelets  236; Potassium 4.6; Sodium 132   Recent Lipid Panel    Component Value Date/Time   CHOL 177 01/08/2012 0809   TRIG 77 06/08/2014 0331   HDL 53 10/18/2010 1927   CHOLHDL 1.8 10/18/2010 1927   VLDL 8 10/18/2010 1927   LDLCALC  10/18/2010 1927    35        Total Cholesterol/HDL:CHD Risk Coronary Heart Disease Risk Table                     Men   Women  1/2 Average Risk   3.4   3.3  Average Risk       5.0   4.4  2 X Average Risk   9.6   7.1  3 X Average Risk  23.4   11.0        Use the calculated  Patient Ratio above and the CHD Risk Table to determine the patient's CHD Risk.        ATP III CLASSIFICATION (LDL):  <100     mg/dL   Optimal  899-870  mg/dL   Near or Above                    Optimal  130-159  mg/dL   Borderline  839-810  mg/dL   High  >809     mg/dL   Very High         ECG personally reviewed by me today- EKG Interpretation Date/Time:  Wednesday September 16 2024 08:47:16 EDT Ventricular Rate:  62 PR Interval:  142 QRS Duration:  160 QT Interval:  454 QTC Calculation: 460 R Axis:   257  Text Interpretation: AV dual-paced rhythm When compared with ECG of 12-Aug-2024 14:09, Vent. rate has increased BY   2 BPM Confirmed by Emelia Hazy [phone]) on 09/16/2024 8:54:14 AM   Echocardiogram 03/2023: 1. Left ventricular ejection fraction, by estimation, is 55 to 60%. The  left ventricle has normal function. The left ventricle has no regional  wall motion abnormalities. Left ventricular diastolic parameters are  consistent with Grade I diastolic  dysfunction (impaired relaxation). The average left ventricular global  longitudinal strain is -21.6 %. The global longitudinal strain is normal.   2. Right ventricular systolic function was not well visualized. The right  ventricular size is normal. There is normal pulmonary artery systolic  pressure.   3. The mitral valve is normal in structure. Trivial mitral valve  regurgitation. No evidence of mitral stenosis.   4.  The aortic valve was not well visualized. Aortic valve regurgitation  is not visualized. No aortic stenosis is present.   5. Aortic dilatation noted. There is borderline dilatation of the aortic  root, measuring 36 mm.   6. The inferior vena cava is normal in size with greater than 50%  respiratory variability, suggesting right atrial pressure of 3 mmHg.   Comparison(s): Echocardiogram done 08/11/17 showed an EF of 50-55%.   Myoview on 08/30/2014: IMPRESSION:  1. Large degree of inferior myocardial scar. No evidence of  ischemia. Mild LV dilatation.   2.  Inferior wall akinesis.   3. Left ventricular ejection fraction 18%   4. High-risk stress test findings* based on severely depressed EF  and degree of myocardial scar.      Assessment & Plan   1.  Coronary artery disease-no chest pain today.  Denies exertional chest discomfort.  Had NSTEMI in 2015 in the setting of respiratory failure and pneumonia.  Cardiac catheterization was not pursued due to renal function and AMS.  He had stress testing 10/15 which showed cardiac scar and no evidence of ischemia mild LV dilation was noted. Continue aspirin , carvedilol , Entresto , atorvastatin , Brilinta   Hyperlipidemia-LDL 53 on 06/03/24 High-fiber diet Increase physical activity as tolerated Continue aspirin , Brilinta , atorvastatin   Essential hypertension-BP today 138/76. 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  He underwent generator change out 12/24. Follows with EP Continue current medical therapy  Preoperative cardiac evaluation-Circumcision    Date of Surgery:  Clearance 10/01/24                                  Surgeon:  Dr. Sherrilee  Surgeon's Group or Practice Name:  [facility] [place]  Phone number:  [phone] Fax number:  [phone]     Primary Cardiologist: Alvan Carrier,  MD  Chart reviewed as part of pre-operative protocol coverage. Given past medical history and time since last visit, based on ACC/AHA guidelines, RUSHTON EARLY would be at acceptable risk for the planned procedure without further cardiovascular testing.   His RCRI is high risk, greater than 11% risk of major cardiac event.  He is able to complete greater than 4 METS of physical activity.  Patient was advised that if he develops new symptoms prior to surgery to contact our office to arrange a follow-up appointment.  He verbalized understanding.  Patient taking Brilinta  for PAD.  Recommendations for holding medication will need to come from vascular surgery/prescribing provider.  His aspirin  may be held for 5 days prior to his procedure.  Please resume as soon as hemostasis is achieved.  I will route this recommendation to the requesting party via Epic fax function and remove from pre-op pool.    Disposition: Follow-up with Dr. Alvan or Almarie Crate NP in 3 months.   Josefa HERO. Idris Edmundson NP-C     09/16/2024, 9:07 AM [facility] [address] [place], [place] [address] Office [phone]    Notice: This dictation was prepared with Dragon dictation along with smaller phrase technology. Any transcriptional errors that result from this process are unintentional and may not be corrected upon review.   I spent 14 minutes examining this patient, reviewing medications, and using patient centered shared decision making involving their cardiac care.   I spent  20 minutes reviewing past medical history,  medications, and prior cardiac tests.

## 2024-09-16 ENCOUNTER — Ambulatory Visit: Attending: General Practice | Admitting: General Practice

## 2024-09-16 ENCOUNTER — Encounter: Payer: Self-pay | Admitting: General Practice

## 2024-09-16 VITALS — BP 138/76 | HR 62 | Ht 63.0 in | Wt 148.0 lb

## 2024-09-16 DIAGNOSIS — Z01810 Encounter for preprocedural cardiovascular examination: Secondary | ICD-10-CM | POA: Diagnosis not present

## 2024-09-16 DIAGNOSIS — I10 Essential (primary) hypertension: Secondary | ICD-10-CM | POA: Insufficient documentation

## 2024-09-16 DIAGNOSIS — I5020 Unspecified systolic (congestive) heart failure: Secondary | ICD-10-CM | POA: Insufficient documentation

## 2024-09-16 DIAGNOSIS — I739 Peripheral vascular disease, unspecified: Secondary | ICD-10-CM | POA: Diagnosis not present

## 2024-09-16 DIAGNOSIS — I2510 Atherosclerotic heart disease of native coronary artery without angina pectoris: Secondary | ICD-10-CM | POA: Diagnosis not present

## 2024-09-16 DIAGNOSIS — Z95810 Presence of automatic (implantable) cardiac defibrillator: Secondary | ICD-10-CM | POA: Insufficient documentation

## 2024-09-16 NOTE — Patient Instructions (Addendum)
 Medication Instructions:  Your physician recommends that you continue on your current medications as directed. Please refer to the Current Medication list given to you today.  *If you need a refill on your cardiac medications before your next appointment, please call your pharmacy*   Follow-Up:  Please keep upcoming appointments      Other Instructions  [facility]:

## 2024-09-17 ENCOUNTER — Other Ambulatory Visit: Payer: Self-pay

## 2024-09-17 NOTE — Patient Instructions (Signed)
 Visit Information  Thank you for taking time to visit with me today. Please don't hesitate to contact me if I can be of assistance to you before our next scheduled telephone appointment.  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 Knowledge Deficit Related to Right lower lobe lobectomy PCP appointment 10-12-24  Goal:  Over the next 30 days, the patient will not experience hospital readmission  Interventions:  Transitions of Care: 10/18/24 Patient reports he is doing well.  He reports some phlegm production.  Taking mucinex  twice a day.  Advised to drink plenty of water  to help loosen secretions.  He verbalized understanding.  He denies pain.     Doctor Visits  - discussed the importance of doctor visits Post-op wound/incision care reviewed with patient/caregiver Reviewed Signs and symptoms of infection Reviewed pain control Reviewed pacing self with activity  Surgeon follow up 09-22-24  Patient Self Care Activities:  Attend all scheduled provider appointments Call pharmacy for medication refills 3-7 days in advance of running out of medications Call provider office for new concerns or questions  Notify RN Care Manager of TOC call rescheduling needs Participate in Transition of Care Program/Attend TOC scheduled calls Perform all self care activities independently  Take medications as prescribed   Monitor for swelling, redness, pain, pus, and fever Keep wound clean and dry.   Notify physician immediately for any changes  Use relaxation  techniques such as deep breathing to control pain.   Plan:  The patient has been provided with contact information for the care management team and has been advised to call with any health related questions  or concerns.         Patient verbalizes understanding of instructions and care plan provided today and agrees to view in MyChart. Active MyChart status and patient understanding of how to access instructions and care plan via MyChart confirmed with patient.     The patient has been provided with contact information for the care management team and has been advised to call with any health related questions or concerns.   Please call the care guide team at [phone] if you need to cancel or reschedule your appointment.   Please call the Suicide and Crisis Lifeline: 988 if you are experiencing a Mental Health or Behavioral Health Crisis or need someone to talk to.  Gabryel Talamo J. Alvie Fowles RN, MSN [facility], [facility] RN Care Manager Direct Dial: [phone]  Fax: [phone] Website: [email or web address]

## 2024-09-17 NOTE — Transitions of Care (Post Inpatient/ED Visit) (Signed)
 Transition of Care week 4  Visit Note  09/17/2024  Name: Roy [name] MRN: [id number]          DOB: Jan 01, 1946  Situation: Patient enrolled in [facility] 30-day program. Visit completed with patient by telephone.   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 Waddell   Alcohol abuse    6 beers per day; hospital admission in 2009 for withdrawal symptoms   Anxiety and depression    denies    Cerebrovascular disease 2009   TIA; 2009- right ICA stent; re-intervention for restenosis complicated by [facility] w/o sx   CHF (congestive heart failure) (HCC) 06/02/2014   Chronic obstructive pulmonary disease (HCC)    Degenerative joint disease    Total shoulder arthroplasty-right   Hyperlipidemia    Hypertension    LBBB (left bundle branch block)    Normal echo-2011; stress nuclear in 09/2010--septal hypoperfusion representing nontransmural infarction or the effect of left bundle branch block, no ischemia   Lung cancer (HCC)    Myocardial infarction (HCC) 06/02/2014   Massive Heart Attack   Peripheral vascular disease    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   Thrombocytopenia    Tobacco abuse    -100 pack years; 1.5 packs per day   Traumatic seroma of thigh    left   Tubular adenoma of colon     Assessment: Patient Reported Symptoms: Cognitive Cognitive Status: Alert and oriented to person, place, and time, Normal speech and language skills      Neurological Neurological Review of Symptoms: No symptoms reported    HEENT HEENT Symptoms Reported: No symptoms reported      Cardiovascular Cardiovascular Symptoms Reported: No symptoms reported    Respiratory Respiratory  Symptoms Reported: No symptoms reported Additional Respiratory Details: Right lower lobe lobectomy. Now only using Oxygen at night at 2 liters. Respiratory Management Strategies: Oxygen therapy Respiratory Self-Management Outcome: 4 (good)  Endocrine Endocrine Symptoms Reported: No symptoms reported    Gastrointestinal Gastrointestinal Symptoms Reported: No symptoms reported      Genitourinary Genitourinary Symptoms Reported: No symptoms reported    Integumentary Integumentary Symptoms Reported: Incision Additional Integumentary Details: Puncture sites continue to heal.  No signs of infection. Skin Management Strategies: Routine screening Skin Self-Management Outcome: 4 (good)  Musculoskeletal Musculoskelatal Symptoms Reviewed: No symptoms reported        Psychosocial Psychosocial Symptoms Reported: No symptoms reported         There were no vitals filed for this visit.  Medications Reviewed Today     Reviewed by Caitlyne Ingham, RN (Case Manager) on 09/17/24 at 1140  Med List Status: <None>   Medication Order Taking? Sig Documenting Provider Last Dose Status Informant  aspirin  EC 81 MG tablet [id number] Yes Take 81 mg by mouth every evening.  [provider]  Active Self  atorvastatin  (LIPITOR ) 20 MG tablet [id number] Yes Take 20 mg by mouth every evening. [provider]  Active Self  BRILINTA  90 MG TABS tablet [id number] Yes Take 90 mg by mouth 2 (two) times daily. [provider]  Active Self  carvedilol  (COREG ) 6.25 MG tablet [id number] Yes TAKE 1 TABLET TWICE A DAY WITH MEALS (DOSE CHANGE, NEW PRESCRIPTION) Waddell Danelle ORN, MD  Active   cetirizine (  ZYRTEC) 10 MG tablet [id number] Yes Take 10 mg by mouth daily. [provider]  Active Self  clotrimazole -betamethasone  (LOTRISONE ) cream [id number] Yes Apply 1 Application topically 2 (two) times daily. McKenzie, Belvie CROME, MD  Active   Cobalamin Combinations (NEURIVA PLUS) CAPS [id number] Yes Take 1  capsule by mouth daily. [provider]  Active Self  dextromethorphan -guaiFENesin  (MUCINEX  DM) 30-600 MG 12hr tablet [id number] Yes Take 1 tablet by mouth 2 (two) times daily. [provider]  Active Self  gabapentin  (NEURONTIN ) 300 MG capsule [id number] Yes Take 600 mg by mouth 2 (two) times daily. [provider]  Active Self  HYDROcodone -acetaminophen  (NORCO) 10-325 MG tablet [id number] Yes Take 1 tablet by mouth at bedtime. [provider]  Active   HYDROcodone -acetaminophen  (NORCO/VICODIN) 5-325 MG tablet [id number] Yes Take 1-2 tablets by mouth See admin instructions. Take 2 tablets in the morning and evening, may take 1 tablet at lunch as needed for pain [provider]  Active Self  Magnesium  250 MG TABS [id number] Yes Take 250 mg by mouth every evening. [provider]  Active Self  methocarbamol (ROBAXIN) 500 MG tablet [id number] Yes Take 1 tablet (500 mg total) by mouth every 8 (eight) hours as needed for muscle spasms. Kerrin Elspeth BROCKS, MD  Active   methocarbamol (ROBAXIN) 500 MG tablet [id number] Yes Take 1 tablet (500 mg total) by mouth 4 (four) times daily. Kerrin Elspeth BROCKS, MD  Active   Multiple Vitamins-Minerals (CENTRUM SILVER PO) [id number] Yes Take 1 tablet by mouth daily. 50+ [provider]  Active Self  Naphazoline HCl (CLEAR EYES OP) [id number] Yes Place 1 drop into both eyes daily as needed (dry eyes). [provider]  Active Self  Omega-3 Fatty Acids (FISH OIL ) 1000 MG CAPS [id number] Yes Take 1,000 mg by mouth every evening.  Patient taking differently: Take 1,000 mg by mouth 2 (two) times daily.   [provider]  Active Self  sacubitril -valsartan  (ENTRESTO ) 97-103 MG [id number] Yes Take 1 tablet by mouth 2 (two) times daily. Alvan Dorn FALCON, MD  Active   Salicylic Acid (GOLD BOND PSORIASIS RELIEF) 3 % CREA [id number] Yes Apply 1 application topically daily as needed (psoriasis). [provider]  Active Self  silodosin (RAPAFLO) 8 MG CAPS capsule [id number] Yes Take 8 mg by mouth daily with breakfast. [provider]  Active Self  SUPER B COMPLEX/C PO [id number] Yes Take 1 tablet by mouth every evening. [provider]  Active Self  tamsulosin  (FLOMAX ) 0.4 MG CAPS capsule [id number] Yes Take 0.4 mg by mouth daily after supper. [provider]  Active Self  traMADol  (ULTRAM ) 50 MG tablet [id number] Yes Take 50 mg by mouth 2 (two) times daily as needed for moderate pain (pain score 4-6). [provider]  Active Self           Med Note ROLENE EVERN PULLING A   Tue Jul 28, 2024  2:10 PM)              Goals Addressed             This Visit's Progress    VBCI Transitions of Care (TOC) Care Plan       Problems:  Recent Hospitalization for treatment of Right lower lobe lobectomy Knowledge Deficit Related to Right lower lobe lobectomy PCP appointment 10-12-24  Goal:  Over the next 30 days, the patient will not experience hospital readmission  Interventions:  Transitions of Care: 09/17/24 Patient reports he is doing  well.  He reports some phlegm production.  Taking mucinex  twice a day.  Advised to drink plenty of water  to help loosen secretions.  He verbalized understanding.  He denies pain.     Doctor Visits  - discussed the importance of doctor visits Post-op wound/incision care reviewed with patient/caregiver Reviewed Signs and symptoms of infection Reviewed pain control Reviewed pacing self with activity  Surgeon follow up 09-22-24 with repeat xray  Patient Self Care Activities:  Attend all scheduled provider appointments Call pharmacy for medication refills 3-7 days in advance of running out of medications Call provider office for new concerns or questions  Notify RN Care Manager of TOC call rescheduling needs Participate in Transition of Care Program/Attend TOC scheduled calls Perform all self care activities  independently  Take medications as prescribed   Monitor for swelling, redness, pain, pus, and fever Keep wound clean and dry.   Notify physician immediately for any changes  Use relaxation  techniques such as deep breathing to control pain.   Plan:  The patient has been provided with contact information for the care management team and has been advised to call with any health related questions or concerns.         Recommendation:   Continue Current Plan of Care  Follow Up Plan:   Telephone follow-up in 1 week  Zeya Balles J. Antwone Capozzoli RN, MSN [facility], [facility] RN Care Manager Direct Dial: [phone]  Fax: [phone] Website: [email or web address]

## 2024-09-18 ENCOUNTER — Other Ambulatory Visit: Payer: Self-pay | Admitting: Thoracic Surgery (Cardiothoracic Vascular Surgery)

## 2024-09-18 DIAGNOSIS — R918 Other nonspecific abnormal finding of lung field: Secondary | ICD-10-CM

## 2024-09-21 ENCOUNTER — Other Ambulatory Visit: Payer: Self-pay

## 2024-09-21 DIAGNOSIS — I70213 Atherosclerosis of native arteries of extremities with intermittent claudication, bilateral legs: Secondary | ICD-10-CM

## 2024-09-22 ENCOUNTER — Ambulatory Visit
Admission: RE | Admit: 2024-09-22 | Discharge: 2024-09-22 | Disposition: A | Discharge status: Home / Self Care | Payer: Self-pay | Source: Ambulatory Visit | Attending: Cardiology | Admitting: Cardiology

## 2024-09-22 ENCOUNTER — Ambulatory Visit: Payer: Self-pay | Admitting: Thoracic Surgery (Cardiothoracic Vascular Surgery)

## 2024-09-22 ENCOUNTER — Encounter: Payer: Self-pay | Admitting: Thoracic Surgery (Cardiothoracic Vascular Surgery)

## 2024-09-22 VITALS — BP 145/75 | HR 67 | Resp 20 | Ht 63.0 in | Wt 149.0 lb

## 2024-09-22 DIAGNOSIS — C3491 Malignant neoplasm of unspecified part of right bronchus or lung: Secondary | ICD-10-CM | POA: Insufficient documentation

## 2024-09-22 DIAGNOSIS — Z902 Acquired absence of lung [part of]: Secondary | ICD-10-CM

## 2024-09-22 DIAGNOSIS — R918 Other nonspecific abnormal finding of lung field: Secondary | ICD-10-CM | POA: Diagnosis not present

## 2024-09-22 NOTE — Progress Notes (Signed)
 [address], [place] [address]             [phone]    HPI: Mr. Barringer returns for scheduled postoperative follow-up visit  Roy [name] is a 78 year old male with a history of tobacco abuse, COPD, MI, left middle branch block, congestive heart failure, ICD, ethanol abuse, cirrhosis, PAD, hypertension, hyperlipidemia, and thrombocytopenia.  He had a low-dose CT for lung cancer screening which showed a 3.1 cm spiculated mass in the right lower lobe he underwent robotic assisted right lower lobectomy on 08/19/2024.  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  Measured 3.8 cm.  I saw him in the office a couple weeks ago.  He was having a lot of pain issues.  A lot of that sounded like muscle spasm and he was started on Robaxin.  In the interim since his last visit his pain has improved dramatically.  Just has some soreness along the right costal margin.  Not taking any pain medication for that at this time.  No respiratory issues.  He is checking his oxygen saturations.  He does still use his oxygen at night.  Past Medical History:  Diagnosis Date   AICD (automatic cardioverter/defibrillator) present 08/19/2015   St Jude BiV ICD for primary prevention by Dr. Waddell   Alcohol abuse    6 beers per day; hospital admission in 2009 for withdrawal symptoms   Anxiety and depression    denies    Cerebrovascular disease 2009   TIA; 2009- right ICA stent; re-intervention for restenosis complicated by [facility] w/o sx   CHF (congestive heart failure) (HCC) 06/02/2014   Chronic obstructive pulmonary disease (HCC)    Degenerative joint disease    Total shoulder arthroplasty-right   Hyperlipidemia    Hypertension    LBBB (left bundle branch block)    Normal echo-2011; stress nuclear in 09/2010--septal hypoperfusion representing nontransmural infarction or the effect of left bundle branch block, no ischemia   Lung cancer (HCC)    Myocardial infarction  (HCC) 06/02/2014   Massive Heart Attack   Peripheral vascular disease    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   Thrombocytopenia    Tobacco abuse    -100 pack years; 1.5 packs per day   Traumatic seroma of thigh    left   Tubular adenoma of colon     Current Outpatient Medications  Medication Sig Dispense Refill   aspirin  EC 81 MG tablet Take 81 mg by mouth every evening.      atorvastatin  (LIPITOR ) 20 MG tablet Take 20 mg by mouth every evening.     BRILINTA  90 MG TABS tablet Take 90 mg by mouth 2 (two) times daily.     carvedilol  (COREG ) 6.25 MG tablet TAKE 1 TABLET TWICE A DAY WITH MEALS (DOSE CHANGE, NEW PRESCRIPTION) 180 tablet 2   cetirizine (ZYRTEC) 10 MG tablet Take 10 mg by mouth daily.     clotrimazole -betamethasone  (LOTRISONE ) cream Apply 1 Application topically 2 (two) times daily. 30 g 3   Cobalamin Combinations (NEURIVA PLUS) CAPS Take 1 capsule by mouth daily.     dextromethorphan -guaiFENesin  (MUCINEX  DM) 30-600 MG 12hr tablet Take 1 tablet by mouth 2 (two) times daily.     gabapentin  (NEURONTIN ) 300 MG capsule Take 600 mg by mouth 2 (two) times daily.     HYDROcodone -acetaminophen  (NORCO) 10-325 MG tablet Take 1 tablet by mouth at bedtime.     HYDROcodone -acetaminophen  (  NORCO/VICODIN) 5-325 MG tablet Take 1-2 tablets by mouth See admin instructions. Take 2 tablets in the morning and evening, may take 1 tablet at lunch as needed for pain     Magnesium  250 MG TABS Take 250 mg by mouth every evening.     methocarbamol (ROBAXIN) 500 MG tablet Take 1 tablet (500 mg total) by mouth every 8 (eight) hours as needed for muscle spasms. 15 tablet 3   methocarbamol (ROBAXIN) 500 MG tablet Take 1 tablet (500 mg total) by mouth 4 (four) times daily. 10 tablet 0   Multiple Vitamins-Minerals (CENTRUM SILVER PO) Take 1 tablet by mouth daily. 50+     Naphazoline HCl (CLEAR EYES OP) Place 1 drop into both eyes daily as needed (dry eyes).     Omega-3 Fatty Acids (FISH OIL )  1000 MG CAPS Take 1,000 mg by mouth every evening. (Patient taking differently: Take 1,000 mg by mouth 2 (two) times daily.)     sacubitril -valsartan  (ENTRESTO ) 97-103 MG Take 1 tablet by mouth 2 (two) times daily. 60 tablet 11   Salicylic Acid (GOLD BOND PSORIASIS RELIEF) 3 % CREA Apply 1 application topically daily as needed (psoriasis).     silodosin (RAPAFLO) 8 MG CAPS capsule Take 8 mg by mouth daily with breakfast.     SUPER B COMPLEX/C PO Take 1 tablet by mouth every evening.     tamsulosin  (FLOMAX ) 0.4 MG CAPS capsule Take 0.4 mg by mouth daily after supper.     traMADol  (ULTRAM ) 50 MG tablet Take 50 mg by mouth 2 (two) times daily as needed for moderate pain (pain score 4-6).     No current facility-administered medications for this visit.    Physical Exam BP (!) 145/75   Pulse 67   Resp 20   Ht 5' 3 (1.6 m)   Wt 149 lb (67.6 kg)   SpO2 93% Comment: RA  BMI 26.17 kg/m  78 year old man in no acute distress Alert and oriented x 3 with no focal deficits Lungs absent breath sounds right base, otherwise clear Cardiac regular rate and rhythm No peripheral edema  Diagnostic Tests: CHEST - 2 VIEW   COMPARISON:  09/01/2024   FINDINGS: Dual lead left-sided pacemaker unchanged. Volume loss right lung with slight cardiomediastinal shift to the right unchanged. Opacification right base unchanged likely small effusion with associated atelectasis. Slight hazy density over the right midlung with peripheral interstitial prominence over the right upper lobe/apex unchanged. Slight stable interstitial prominence over the left base. Remainder of the exam is unchanged.   IMPRESSION: Stable volume loss right lung. Stable opacification right base likely small effusion with associated atelectasis. Stable hazy density and interstitial prominence over the right upper lobe/apex and left base.     Electronically Signed   By: Toribio Agreste M.D.   On: 09/22/2024 10:27 I personally  reviewed the chest x-ray images.  Postoperative changes from right lower lobectomy.  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  Status post right lower lobectomy-now a little over a month out from surgery.  Doing extremely well.  Pain has improved dramatically and is no longer requiring any pain medication.  Still uses oxygen at night but does not needed during the day.  Exercise tolerance is good.  He had stage Ib  disease.  The tumor was less than 4 cm but only slightly.  Will refer to Dr. Davonna at [facility] since he is from [place].  Plan: Return in 2 months with PA and lateral chest x-ray to check on progress  [name], MD [facility] [phone]

## 2024-09-24 ENCOUNTER — Other Ambulatory Visit: Payer: Self-pay

## 2024-09-24 NOTE — Patient Instructions (Signed)
 Visit Information  Thank you for taking time to visit with me today.    Following is a copy of your care plan:   Goals Addressed             This Visit's Progress    COMPLETED: VBCI Transitions of Care (TOC) Care Plan       Problems:  Recent Hospitalization for treatment of Right lower lobe lobectomy Knowledge Deficit Related to Right lower lobe lobectomy PCP appointment 10-12-24  Goal:  Over the next 30 days, the patient will not experience hospital readmission  Interventions:  Transitions of Care: 09/24/24 Patient reports he is doing well. Seen by surgeon.  Xray was good per patient.  Surgeon sending him to oncology for consult as a precaution.  He states he feels great and is learning to pace self with activities.  Advised patient on completing program.  He feels he is doing good and declines further follow up.   Doctor Visits  - discussed the importance of doctor visits Post-op wound/incision care reviewed with patient/caregiver Reviewed Signs and symptoms of infection Reviewed pain control Reviewed pacing self with activity    Patient Self Care Activities:  Attend all scheduled provider appointments Call pharmacy for medication refills 3-7 days in advance of running out of medications Call provider office for new concerns or questions  Notify RN Care Manager of TOC call rescheduling needs Participate in Transition of Care Program/Attend TOC scheduled calls Perform all self care activities independently  Take medications as prescribed   Monitor for swelling, redness, pain, pus, and fever Keep wound clean and dry.   Notify physician immediately for any changes  Use relaxation  techniques such as deep breathing to control pain.   Plan:  The patient has been provided with contact information for the care management team and has been advised to call with any health related questions or concerns.  Closing to TOC 30 day goal met        Patient verbalizes  understanding of instructions and care plan provided today and agrees to view in MyChart. Active MyChart status and patient understanding of how to access instructions and care plan via MyChart confirmed with patient.     The patient has been provided with contact information for the care management team and has been advised to call with any health related questions or concerns.   Please call the care guide team at [phone] if you need to cancel or reschedule your appointment.   Please call the Suicide and Crisis Lifeline: 988 if you are experiencing a Mental Health or Behavioral Health Crisis or need someone to talk to.   Dyshaun Bonzo J. Jebidiah Baggerly RN, MSN [facility], [facility] RN Care Manager Direct Dial: [phone]  Fax: [phone] Website: [email or web address]

## 2024-09-24 NOTE — Transitions of Care (Post Inpatient/ED Visit) (Signed)
 Transition of Care Week 5  Visit Note  09/24/2024  Name: Roy [name] MRN: [id number]          DOB: [birth year]  Situation: Patient enrolled in [facility] 30-day program. Visit completed with patient by telephone.   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 Waddell   Alcohol abuse    6 beers per day; hospital admission in 2009 for withdrawal symptoms   Anxiety and depression    denies    Cerebrovascular disease 2009   TIA; 2009- right ICA stent; re-intervention for restenosis complicated by [facility] w/o sx   CHF (congestive heart failure) (HCC) 06/02/2014   Chronic obstructive pulmonary disease (HCC)    Degenerative joint disease    Total shoulder arthroplasty-right   Hyperlipidemia    Hypertension    LBBB (left bundle branch block)    Normal echo-2011; stress nuclear in 09/2010--septal hypoperfusion representing nontransmural infarction or the effect of left bundle branch block, no ischemia   Lung cancer (HCC)    Myocardial infarction (HCC) 06/02/2014   Massive Heart Attack   Peripheral vascular disease    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   Thrombocytopenia    Tobacco abuse    -100 pack years; 1.5 packs per day   Traumatic seroma of thigh    left   Tubular adenoma of colon     Assessment: Patient Reported Symptoms: Cognitive Cognitive Status: Alert and oriented to person, place, and time, Normal speech and language skills      Neurological Neurological Review of Symptoms: No symptoms reported    HEENT HEENT Symptoms Reported: No symptoms reported      Cardiovascular Cardiovascular Symptoms Reported: No symptoms reported    Respiratory Respiratory  Symptoms Reported: No symptoms reported Additional Respiratory Details: Right lower lobe lobectomy. Now only using Oxygen at night at 2 liters. Learning to pace self with activities Respiratory Management Strategies: Oxygen therapy Respiratory Self-Management Outcome: 4 (good)  Endocrine Endocrine Symptoms Reported: No symptoms reported    Gastrointestinal Gastrointestinal Symptoms Reported: No symptoms reported      Genitourinary Genitourinary Symptoms Reported: No symptoms reported    Integumentary Integumentary Symptoms Reported: Incision Additional Integumentary Details: Puncture sites healed. Advised to continue to monitor Skin Management Strategies: Routine screening Skin Self-Management Outcome: 4 (good)  Musculoskeletal Musculoskelatal Symptoms Reviewed: No symptoms reported   Falls in the past year?: No    Psychosocial Psychosocial Symptoms Reported: No symptoms reported         There were no vitals filed for this visit.  Medications Reviewed Today     Reviewed by Aiyden Lauderback, RN (Case Manager) on 09/24/24 at 1128  Med List Status: <None>   Medication Order Taking? Sig Documenting Provider Last Dose Status Informant  aspirin  EC 81 MG tablet [id number] Yes Take 81 mg by mouth every evening.  [provider]  Active Self  atorvastatin  (LIPITOR ) 20 MG tablet [id number] Yes Take 20 mg by mouth every evening. [provider]  Active Self  BRILINTA  90 MG TABS tablet [id number] Yes Take 90 mg by mouth 2 (two) times daily. [provider]  Active Self  carvedilol  (COREG ) 6.25 MG tablet [id number] Yes TAKE 1 TABLET TWICE A DAY WITH MEALS (DOSE CHANGE, NEW PRESCRIPTION) Waddell,  Danelle ORN, MD  Active   cetirizine (ZYRTEC) 10 MG tablet [id number] Yes Take 10 mg by mouth daily. [provider]  Active Self  clotrimazole -betamethasone  (LOTRISONE ) cream [id number] Yes Apply 1 Application topically 2 (two) times daily. McKenzie, Belvie CROME, MD  Active    Cobalamin Combinations (NEURIVA PLUS) CAPS [id number] Yes Take 1 capsule by mouth daily. [provider]  Active Self  dextromethorphan -guaiFENesin  (MUCINEX  DM) 30-600 MG 12hr tablet [id number] Yes Take 1 tablet by mouth 2 (two) times daily. [provider]  Active Self  gabapentin  (NEURONTIN ) 300 MG capsule [id number] Yes Take 600 mg by mouth 2 (two) times daily. [provider]  Active Self  HYDROcodone -acetaminophen  (NORCO) 10-325 MG tablet [id number] Yes Take 1 tablet by mouth at bedtime. [provider]  Active   HYDROcodone -acetaminophen  (NORCO/VICODIN) 5-325 MG tablet [id number] Yes Take 1-2 tablets by mouth See admin instructions. Take 2 tablets in the morning and evening, may take 1 tablet at lunch as needed for pain [provider]  Active Self  Magnesium  250 MG TABS [id number] Yes Take 250 mg by mouth every evening. [provider]  Active Self  methocarbamol (ROBAXIN) 500 MG tablet [id number] Yes Take 1 tablet (500 mg total) by mouth every 8 (eight) hours as needed for muscle spasms. Kerrin Elspeth BROCKS, MD  Active   methocarbamol (ROBAXIN) 500 MG tablet [id number] Yes Take 1 tablet (500 mg total) by mouth 4 (four) times daily. Kerrin Elspeth BROCKS, MD  Active   Multiple Vitamins-Minerals (CENTRUM SILVER PO) [id number] Yes Take 1 tablet by mouth daily. 50+ [provider]  Active Self  Naphazoline HCl (CLEAR EYES OP) [id number] Yes Place 1 drop into both eyes daily as needed (dry eyes). [provider]  Active Self  Omega-3 Fatty Acids (FISH OIL ) 1000 MG CAPS [id number] Yes Take 1,000 mg by mouth every evening.  Patient taking differently: Take 1,000 mg by mouth 2 (two) times daily.   [provider]  Active Self  sacubitril -valsartan  (ENTRESTO ) 97-103 MG [id number] Yes Take 1 tablet by mouth 2 (two) times daily. Alvan Dorn FALCON, MD  Active   Salicylic Acid (GOLD BOND PSORIASIS RELIEF) 3 % CREA [id number] Yes Apply 1  application topically daily as needed (psoriasis). [provider]  Active Self  silodosin (RAPAFLO) 8 MG CAPS capsule [id number] Yes Take 8 mg by mouth daily with breakfast. [provider]  Active Self  SUPER B COMPLEX/C PO [id number] Yes Take 1 tablet by mouth every evening. [provider]  Active Self  tamsulosin  (FLOMAX ) 0.4 MG CAPS capsule [id number] Yes Take 0.4 mg by mouth daily after supper. [provider]  Active Self  traMADol  (ULTRAM ) 50 MG tablet [id number] Yes Take 50 mg by mouth 2 (two) times daily as needed for moderate pain (pain score 4-6). [provider]  Active Self           Med Note ROLENE STALLION, SUSAN A   Tue Jul 28, 2024  2:10 PM)              Goals Addressed             This Visit's Progress    COMPLETED: VBCI Transitions of Care (TOC) Care Plan       Problems:  Recent Hospitalization for treatment of Right lower lobe lobectomy Knowledge Deficit Related to Right lower lobe lobectomy PCP appointment 10-12-24  Goal:  Over the next 30 days, the patient will not experience hospital readmission  Interventions:  Transitions of Care: 09/24/24 Patient reports he is doing well. Seen by surgeon.  Xray was good per patient.  Surgeon sending him to oncology for consult as a precaution.  He states he feels great and is learning to pace self with activities.  Advised patient on completing program.  He feels he is doing good and declines further follow up.   Doctor Visits  - discussed the importance of doctor visits Post-op wound/incision care reviewed with patient/caregiver Reviewed Signs and symptoms of infection Reviewed pain control Reviewed pacing self with activity    Patient Self Care Activities:  Attend all scheduled provider appointments Call pharmacy for medication refills 3-7 days in advance of running out of medications Call provider office for new concerns or questions  Notify RN Care Manager of TOC  call rescheduling needs Participate in Transition of Care Program/Attend TOC scheduled calls Perform all self care activities independently  Take medications as prescribed   Monitor for swelling, redness, pain, pus, and fever Keep wound clean and dry.   Notify physician immediately for any changes  Use relaxation  techniques such as deep breathing to control pain.   Plan:  The patient has been provided with contact information for the care management team and has been advised to call with any health related questions or concerns.  Closing to TOC 30 day goal met        Recommendation:   Continue Current Plan of Care  Follow Up Plan:   Closing From:  Transitions of Care Program  Carlette Palmatier DOROTHA Seeds RN, MSN [place]  [facility], [facility] RN Care Manager Direct Dial: [phone]  Fax: [phone] Website: [email or web address]

## 2024-09-28 NOTE — Telephone Encounter (Signed)
 I will re-fax ov notes from Josefa Beauvais, FNP who cleared the pt on 09/16/24.

## 2024-09-28 NOTE — Telephone Encounter (Signed)
 Pt is calling back saying surgeon's office has not received clearance or medication instructions. Please advise

## 2024-09-29 ENCOUNTER — Encounter (HOSPITAL_COMMUNITY): Admission: RE | Admit: 2024-09-29 | Source: Ambulatory Visit

## 2024-10-01 ENCOUNTER — Inpatient Hospital Stay

## 2024-10-01 ENCOUNTER — Inpatient Hospital Stay: Attending: Oncology | Admitting: Oncology

## 2024-10-01 ENCOUNTER — Encounter: Payer: Self-pay | Admitting: Oncology

## 2024-10-01 VITALS — BP 159/58 | HR 64 | Temp 98.3°F | Resp 18 | Ht 63.0 in | Wt 148.7 lb

## 2024-10-01 DIAGNOSIS — Z87891 Personal history of nicotine dependence: Secondary | ICD-10-CM | POA: Diagnosis not present

## 2024-10-01 DIAGNOSIS — C3492 Malignant neoplasm of unspecified part of left bronchus or lung: Secondary | ICD-10-CM

## 2024-10-01 DIAGNOSIS — C3490 Malignant neoplasm of unspecified part of unspecified bronchus or lung: Secondary | ICD-10-CM

## 2024-10-01 DIAGNOSIS — Z79899 Other long term (current) drug therapy: Secondary | ICD-10-CM | POA: Diagnosis not present

## 2024-10-01 DIAGNOSIS — C3431 Malignant neoplasm of lower lobe, right bronchus or lung: Secondary | ICD-10-CM | POA: Insufficient documentation

## 2024-10-01 DIAGNOSIS — Z7982 Long term (current) use of aspirin: Secondary | ICD-10-CM | POA: Insufficient documentation

## 2024-10-01 NOTE — Progress Notes (Signed)
 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 Summary of oncologic history is as follows:  Diagnosis: Non small cell lung cancer- Squamous cell carcinoma  -04/11/2024: CT Lung Cancer Screening:  Lung-RADS 4B, suspicious. Dominant 3.1 cm irregular spiculated mass in the right lower lobe. Additional scattered bilateral pulmonary nodules range in size from 5 mm to a 9.2 mm.  -07/02/2024: Initial PET: The previously demonstrated spiculated right lower lobe mass is hypermetabolic, consistent with primary bronchogenic carcinoma. No evidence of metastatic disease. Additional scattered pulmonary nodules are grossly stable, without hypermetabolic activity. -07/30/2024: Bronchoscopy with RLL Lung mass FNA and Lymph node (7 and 11R) FNA. Cytology: Squamous cell carcinoma. Lymph nodes negative for malignancy.  -08/19/2024: Right lower lobectomy.  Pathology: Well to moderately differentiated squamous cell carcinoma, 3.8 cm. Carcinoma invades visceral pleura. Bronchial margin and vascular margin are negative for carcinoma. Lymphovascular invasion identified. Eleven lymph nodes, negative for metastatic carcinoma (0/11). Staging: pT2a, pN0     History of Presenting Illness: Discussed the use of AI scribe software for clinical note transcription with the patient, who gave verbal consent to proceed.  History of Present Illness Roy [name] is a 78 year old male with non-small cell lung cancer who presents for post-surgical follow-up and evaluation for potential chemotherapy.  Patient has a medical history of COPD, MI, left middle branch block, CHF, cirrhosis, peripheral vascular disease, hypertension, hyperlipidemia, and thrombocytopenia.   He underwent surgery to remove a portion of  his lung due to non-small cell lung cancer, specifically squamous type. The tumor was 3.8 centimeters, approximately the size of a ping pong ball. He describes the surgery as very painful, stating 'I thought I was going to die.'  He has a significant smoking history, having smoked for almost seventy years, and quit the day before his surgery.   He has no complaints today.  Reports recovery from surgery.     Medical History: Past Medical History:  Diagnosis Date   AICD (automatic cardioverter/defibrillator) present 08/19/2015   St Jude BiV ICD for primary prevention by Dr. Waddell   Alcohol abuse    6 beers per day; hospital admission in 2009 for withdrawal symptoms   Anxiety and depression    denies    Cerebrovascular disease 2009   TIA; 2009- right ICA stent; re-intervention for restenosis complicated by [facility] w/o sx   CHF (congestive heart failure) (HCC) 06/02/2014   Chronic obstructive pulmonary disease (HCC)    Degenerative joint disease    Total shoulder arthroplasty-right   Hyperlipidemia    Hypertension    LBBB (left bundle branch block)    Normal echo-2011; stress nuclear in 09/2010--septal hypoperfusion representing nontransmural infarction or the effect of left bundle branch block, no ischemia   Lung cancer (HCC)    Myocardial infarction (HCC) 06/02/2014   Massive Heart Attack   Peripheral vascular disease    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   Thrombocytopenia    Tobacco abuse    -100 pack years; 1.5 packs per day   Traumatic seroma of thigh    left   Tubular adenoma of colon     Surgical history: Past Surgical History:  Procedure Laterality Date   ABDOMINAL AORTAGRAM [name] 05/04/2014   Procedure: ABDOMINAL EZELLA;  Surgeon: Gaile LELON New, Roy [name];  [facility]: [facility];  Service: Cardiovascular;  Laterality: [name];   ABDOMINAL AORTOGRAM [name] 06/25/2017   Procedure: Abdominal Aortogram;  Surgeon: Serene Gaile ORN, Roy [name];  [facility] INVAS[facility] CV [facility];   Service: Cardiovascular;  Laterality: [name];   ABDOMINAL AORTOGRAM W/LOWER EXTREMITY [name] 04/22/2018   Procedure: ABDOMINAL AORTOGRAM W/LOWER EXTREMITY;  Surgeon: Serene Gaile ORN, Roy [name];  [facility];  Service: Cardiovascular;  Laterality: [name];   ABDOMINAL AORTOGRAM W/LOWER EXTREMITY [name] 03/08/2020   Procedure: ABDOMINAL AORTOGRAM W/LOWER EXTREMITY;  Surgeon: Serene Gaile ORN, Roy [name];  [facility];  Service: Cardiovascular;  Laterality: [name];   ABDOMINAL AORTOGRAM W/LOWER EXTREMITY Bilateral 08/23/2020   Procedure: ABDOMINAL AORTOGRAM W/LOWER EXTREMITY;  Surgeon: Serene Gaile ORN, Roy [name];  [facility];  Service: Cardiovascular;  Laterality: Bilateral;   ABDOMINAL AORTOGRAM W/LOWER EXTREMITY [name] 10/31/2021   Procedure: ABDOMINAL AORTOGRAM W/LOWER EXTREMITY;  Surgeon: Serene Gaile ORN, Roy [name];  [facility];  Service: Cardiovascular;  Laterality: [name];   ABDOMINAL AORTOGRAM W/LOWER EXTREMITY [name] 12/19/2021   Procedure: ABDOMINAL AORTOGRAM W/LOWER EXTREMITY;  Surgeon: Serene Gaile ORN, Roy [name];  [facility];  Service: Cardiovascular;  Laterality: [name];   ABDOMINAL AORTOGRAM W/LOWER EXTREMITY Bilateral 02/19/2023   Procedure: ABDOMINAL AORTOGRAM W/LOWER EXTREMITY;  Surgeon: Serene Gaile ORN, Roy [name];  [facility];  Service: Cardiovascular;  Laterality: Bilateral;   AGILE CAPSULE [name] 03/18/2014   Procedure: AGILE CAPSULE;  Surgeon: Margo LITTIE Haddock, Roy [name];  [facility]: [facility];  Service: Endoscopy;  Laterality: [name];  7:30   APPLICATION OF WOUND VAC Left 09/03/2017   Procedure: APPLICATION OF WOUND VAC;  Surgeon: Laurence Redell LITTIE, Roy [name];  Location: [facility] OR;  Service: Vascular;  Laterality: Left;   APPLICATION OF WOUND VAC Right 04/07/2020   Procedure: PLACEMENT OF ANTIBIOTIC BEADS AND APPLICATION OF WOUND VAC;  Surgeon: Serene Gaile ORN, Roy [name];  [facility];  Service: Vascular;  Laterality: Right;   BACK SURGERY     BACTERIAL OVERGROWTH TEST [name]  05/24/2015   Procedure: BACTERIAL OVERGROWTH TEST;  Surgeon: Margo LITTIE Haddock, Roy [name];  [facility];  Service: Endoscopy;  Laterality: [name];  0700   BI-VENTRICULAR IMPLANTABLE CARDIOVERTER DEFIBRILLATOR  (CRT-D)  08/19/2015   BIV ICD GENERATOR CHANGEOUT [name] 11/11/2023   Procedure: BIV ICD GENERATOR CHANGEOUT;  Surgeon: Waddell Danelle ORN, Roy [name];  Location: [facility] INVASIVE CV LAB;  Service: Cardiovascular;  Laterality: [name];   BRONCHIAL BIOPSY  07/30/2024   Procedure: BRONCHOSCOPY, WITH BIOPSY;  Surgeon: Kerrin Elspeth BROCKS, Roy [name];  Location: [facility] ENDOSCOPY;  Service: Thoracic;;   BRONCHIAL NEEDLE ASPIRATION BIOPSY  07/30/2024   Procedure: BRONCHOSCOPY, WITH NEEDLE ASPIRATION BIOPSY;  Surgeon: Kerrin Elspeth BROCKS, Roy [name];  Location: [facility] ENDOSCOPY;  Service: Thoracic;;   CARPAL TUNNEL RELEASE Left 02/02/2016   Procedure: LEFT CARPAL TUNNEL RELEASE;  Surgeon: Arley Curia, Roy [name];  Location: [facility];  Service: Orthopedics;  Laterality: Left;  ANESTHESIA: IV REGIONAL UPPER ARM   CATARACT EXTRACTION W/PHACO Right 03/08/2015   Procedure: CATARACT EXTRACTION PHACO AND INTRAOCULAR LENS PLACEMENT (IOC);  Surgeon: Oneil Platts, Roy [name];  Location: [facility] ORS;  Service: Ophthalmology;  Laterality: Right;  CDE:9.46   CATARACT EXTRACTION W/PHACO Left 03/22/2015   Procedure: CATARACT EXTRACTION PHACO AND INTRAOCULAR LENS PLACEMENT (IOC);  Surgeon: Oneil Platts, Roy [name];  Location: [facility] OR[facility];  Service: Ophthalmology;  Laterality: Left;  CDE:5.80   COLONOSCOPY  08/22/09   Fields-(Tubular Adenoma)3-mm transverse polyp/4-mm polyp otherwise noraml/small internal hemorrhoids   COLONOSCOPY [name] 04/14/2014  DOQ:dfjoo internal hemorrhids/normal mocsa in the terminal iluem/left colonis redundant   COLONOSCOPY [name] 07/01/2024   Procedure: COLONOSCOPY;  Surgeon: Shaaron Lamar HERO, Roy [name];  [facility];  Service: Endoscopy;  Laterality: [name];  900am, asa 3   COLONOSCOPY W/ POLYPECTOMY  2011   ENDARTERECTOMY FEMORAL Left 08/16/2017   Procedure:  ENDARTERECTOMY LEFT PROFUNDA FEMORAL;  Surgeon: Serene Gaile ORN, Roy [name];  Location: [facility] OR;  Service: Vascular;  Laterality: Left;   ENDARTERECTOMY FEMORAL Right 03/31/2020   Procedure: RIGHT FEMORAL ENDARTERECTOMY;  Surgeon: Serene Gaile ORN, Roy [name];  [facility] OR;  Service: Vascular;  Laterality: Right;   EP IMPLANTABLE DEVICE [name] 08/19/2015   Procedure: BiV ICD Insertion CRT-D;  Surgeon: Danelle ORN [place], Roy [name];  Location: [facility] INVASIVE CV LAB;  Service: Cardiovascular;  Laterality: [name];   ESOPHAGOGASTRODUODENOSCOPY [name] 03/05/2014   SLF: 1. Stricture at the gastroesophagael junction 2. Small hiatal hernia 3. Moderate non-erosive gastritis and duodentitis. 4. No surce for Melena identified.    FEMORAL-POPLITEAL BYPASS GRAFT Left 08/16/2017   Procedure: LEFT  FEMORAL-POPLITEAL ARTERY  BYPASS GRAFT;  Surgeon: Serene Gaile ORN, Roy [name];  [facility];  Service: Vascular;  Laterality: Left;   GIVENS CAPSULE STUDY [name] 03/30/2014   Procedure: GIVENS CAPSULE STUDY;  Surgeon: Margo LITTIE Haddock, Roy [name];  [facility];  Service: Endoscopy;  Laterality: [name];  7:30   GROIN DEBRIDEMENT Right 04/07/2020   Procedure: INCISION AND DRAINAGE OF RIGHT GROIN;  Surgeon: Serene Gaile ORN, Roy [name];  [facility];  Service: Vascular;  Laterality: Right;   I & D EXTREMITY Left 09/03/2017   Procedure: IRRIGATION AND DEBRIDEMENT EXTREMITY LEFT THIGH SEROMA;  Surgeon: Laurence Redell LITTIE, Roy [name];  Location: [facility];  Service: Vascular;  Laterality: Left;   INSERT / REPLACE / REMOVE PACEMAKER     INSERTION OF ILIAC STENT  03/31/2020   Procedure: INSERTION OF RIGHT ILIAC ARTERY STENT AND RIGHT SUPERFICIAL FEMORAL ARTERY STENT;  Surgeon: Serene Gaile ORN, Roy [name];  [facility];  Service: Vascular;;   INTERCOSTAL NERVE BLOCK Right 08/19/2024   Procedure: BLOCK, NERVE, INTERCOSTAL;  Surgeon: Kerrin Elspeth BROCKS, Roy [name];  [facility];  Service: Thoracic;  Laterality: Right;   IR ANGIO INTRA EXTRACRAN SEL COM CAROTID INNOMINATE BILAT MOD SED  07/16/2017   IR  ANGIO INTRA EXTRACRAN SEL COM CAROTID INNOMINATE BILAT MOD SED  06/12/2019   IR ANGIO INTRA EXTRACRAN SEL COM CAROTID INNOMINATE BILAT MOD SED  01/17/2021   IR ANGIO VERTEBRAL SEL SUBCLAVIAN INNOMINATE BILAT MOD SED  07/16/2017   IR ANGIO VERTEBRAL SEL SUBCLAVIAN INNOMINATE BILAT MOD SED  06/12/2019   IR ANGIO VERTEBRAL SEL SUBCLAVIAN INNOMINATE BILAT MOD SED  01/17/2021   IR RADIOLOGIST EVAL & MGMT  04/01/2017   IR RADIOLOGIST EVAL & MGMT  08/02/2017   IR TRANSCATH EXCRAN VERT OR CAR A STENT  07/22/2019   IR US  GUIDE VASC ACCESS RIGHT  06/12/2019   IR US  GUIDE VASC ACCESS RIGHT  01/17/2021   JOINT REPLACEMENT Right    Total Shoulder Replacement    LOBECTOMY, LUNG, ROBOT-ASSISTED, USING VATS Right 08/19/2024   Procedure: RIGHT LOWER LOBECTOMY, LUNG, ROBOT-ASSISTED, USING VATS;  Surgeon: Kerrin Elspeth BROCKS, Roy [name];  [facility]: [facility];  Service: Thoracic;  Laterality: Right;  ROBOTIC RIGHT LOWER LOBECTOMY   LOWER EXTREMITY ANGIOGRAPHY Bilateral 06/25/2017   Procedure: Lower Extremity Angiography;  Surgeon: Serene Gaile ORN, Roy [name];  [facility];  Service: Cardiovascular;  Laterality: Bilateral;   LUMBAR FUSION  2010   LYMPH  NODE BIOPSY Right 08/19/2024   Procedure: LYMPH NODE BIOPSY;  Surgeon: Kerrin Elspeth BROCKS, Roy [name];  Location: [facility] OR;  Service: Thoracic;  Laterality: Right;   PATCH ANGIOPLASTY Right 03/31/2020   Procedure: PATCH ANGIOPLASTY USING XENOSURE BIOLOGIC PATCH 1cm x 14cm;  Surgeon: Serene Gaile ORN, Roy [name];  Location: [facility] OR;  Service: Vascular;  Laterality: Right;   PERIPHERAL VASCULAR ATHERECTOMY Right 04/22/2018   Procedure: PERIPHERAL VASCULAR ATHERECTOMY;  Surgeon: Serene Gaile ORN, Roy [name];  [facility];  Service: Cardiovascular;  Laterality: Right;  right superficial femoral   PERIPHERAL VASCULAR BALLOON ANGIOPLASTY Right 04/22/2018   Procedure: PERIPHERAL VASCULAR BALLOON ANGIOPLASTY;  Surgeon: Serene Gaile ORN, Roy [name];  [facility];  Service: Cardiovascular;   Laterality: Right;  external iliac   PERIPHERAL VASCULAR BALLOON ANGIOPLASTY Left 03/08/2020   Procedure: PERIPHERAL VASCULAR BALLOON ANGIOPLASTY;  Surgeon: Serene Gaile ORN, Roy [name];  [facility];  Service: Cardiovascular;  Laterality: Left;  Common Femoral   PERIPHERAL VASCULAR BALLOON ANGIOPLASTY Left 08/23/2020   Procedure: PERIPHERAL VASCULAR BALLOON ANGIOPLASTY;  Surgeon: Serene Gaile ORN, Roy [name];  [facility];  Service: Cardiovascular;  Laterality: Left;  common femoral   PERIPHERAL VASCULAR BALLOON ANGIOPLASTY Left 10/31/2021   Procedure: PERIPHERAL VASCULAR BALLOON ANGIOPLASTY;  Surgeon: Serene Gaile ORN, Roy [name];  [facility];  Service: Cardiovascular;  Laterality: Left;  external iliac   PERIPHERAL VASCULAR INTERVENTION Right 03/08/2020   Procedure: PERIPHERAL VASCULAR INTERVENTION;  Surgeon: Serene Gaile ORN, Roy [name];  [facility];  Service: Cardiovascular;  Laterality: Right;  SFA   PERIPHERAL VASCULAR INTERVENTION Right 08/23/2020   Procedure: PERIPHERAL VASCULAR INTERVENTION;  Surgeon: Serene Gaile ORN, Roy [name];  [facility];  Service: Cardiovascular;  Laterality: Right;  external iliac   PERIPHERAL VASCULAR INTERVENTION Right 10/31/2021   Procedure: PERIPHERAL VASCULAR INTERVENTION;  Surgeon: Serene Gaile ORN, Roy [name];  [facility];  Service: Cardiovascular;  Laterality: Right;  superficial femoral artery   PERIPHERAL VASCULAR INTERVENTION Left 12/19/2021   Procedure: PERIPHERAL VASCULAR INTERVENTION;  Surgeon: Serene Gaile ORN, Roy [name];  [facility];  Service: Cardiovascular;  Laterality: Left;  stent lt ext iliac / pta common femoral   PERIPHERAL VASCULAR INTERVENTION  02/19/2023   Procedure: PERIPHERAL VASCULAR INTERVENTION;  Surgeon: Serene Gaile ORN, Roy [name];  [facility];  Service: Cardiovascular;;   RADIOLOGY WITH ANESTHESIA [name] 07/01/2019   Procedure: STENTING;  Surgeon: Dolphus Carrion, Roy [name];  [facility];  Service: Radiology;  Laterality: [name];   RADIOLOGY WITH ANESTHESIA [name] 07/22/2019   Procedure: STENTING;  Surgeon: Dolphus Carrion, Roy [name];  Location[facility];  Service: Radiology;  Laterality: [name];   TOTAL SHOULDER ARTHROPLASTY Right 2011   Dr. Dozier COWBOY NERVE TRANSPOSITION Left 02/02/2016   Procedure: LEFT IN-SITU DECOMPRESSION ULNAR NERVE ;  Surgeon: Arley Curia, Roy [name];  Location: [facility];  Service: Orthopedics;  Laterality: Left;   ULNAR TUNNEL RELEASE Left 02/02/2016   Procedure: LEFT CUBITAL TUNNEL RELEASE;  Surgeon: Arley Curia, Roy [name];  Location: [facility];  Service: Orthopedics;  Laterality: Left;   VASECTOMY  1971   VEIN HARVEST Left 08/16/2017   Procedure: USING NON REVERSE LEFT GREATER SAPHENOUS VEIN HARVEST;  Surgeon: Serene Gaile ORN, Roy [name];  [facility];  Service: Vascular;  Laterality: Left;   VIDEO BRONCHOSCOPY WITH ENDOBRONCHIAL NAVIGATION [name] 07/30/2024   Procedure: VIDEO BRONCHOSCOPY WITH ENDOBRONCHIAL NAVIGATION;  Surgeon: Kerrin Elspeth BROCKS, Roy [name];  Location[facility] ENDOS[facility];  Service: Thoracic;  Laterality: [name];   VIDEO BRONCHOSCOPY WITH ENDOBRONCHIAL ULTRASOUND [name] 07/30/2024   Procedure: BRONCHOSCOPY, WITH EBUS;  Surgeon: Kerrin Elspeth BROCKS, Roy [name];  Location: [facility] ENDOSCOPY;  Service: Thoracic;  Laterality: [name];  ROBOTIC BRONCHOSCOPY WITH EBUS     Allergies:  has no known allergies.  Medications:  Current Outpatient Medications  Medication Sig Dispense Refill   aspirin  EC 81 MG tablet Take 81 mg by mouth every evening.      atorvastatin  (LIPITOR ) 20 MG tablet Take 20 mg by mouth every evening.     BRILINTA  90 MG TABS tablet Take 90 mg by mouth 2 (two) times daily.     carvedilol  (COREG ) 6.25 MG tablet TAKE 1 TABLET TWICE A DAY WITH MEALS (DOSE CHANGE, NEW PRESCRIPTION) 180 tablet 2   cetirizine (ZYRTEC) 10 MG tablet Take 10 mg by mouth daily.     clotrimazole -betamethasone  (LOTRISONE ) cream Apply 1 Application  topically 2 (two) times daily. 30 g 3   Cobalamin Combinations (NEURIVA PLUS) CAPS Take 1 capsule by mouth daily.     dextromethorphan -guaiFENesin  (MUCINEX  DM) 30-600 MG 12hr tablet Take 1 tablet by mouth 2 (two) times daily.     gabapentin  (NEURONTIN ) 300 MG capsule Take 600 mg by mouth 2 (two) times daily.     HYDROcodone -acetaminophen  (NORCO) 10-325 MG tablet Take 1 tablet by mouth at bedtime.     HYDROcodone -acetaminophen  (NORCO/VICODIN) 5-325 MG tablet Take 1-2 tablets by mouth See admin instructions. Take 2 tablets in the morning and evening, may take 1 tablet at lunch as needed for pain     Magnesium  250 MG TABS Take 250 mg by mouth every evening.     methocarbamol  (ROBAXIN ) 500 MG tablet Take 1 tablet (500 mg total) by mouth every 8 (eight) hours as needed for muscle spasms. 15 tablet 3   methocarbamol  (ROBAXIN ) 500 MG tablet Take 1 tablet (500 mg total) by mouth 4 (four) times daily. 10 tablet 0   Multiple Vitamins-Minerals (CENTRUM SILVER PO) Take 1 tablet by mouth daily. 50+     Naphazoline HCl (CLEAR EYES OP) Place 1 drop into both eyes daily as needed (dry eyes).     Omega-3 Fatty Acids (FISH OIL ) 1000 MG CAPS Take 1,000 mg by mouth every evening. (Patient taking differently: Take 1,000 mg by mouth 2 (two) times daily.)     sacubitril -valsartan  (ENTRESTO ) 97-103 MG Take 1 tablet by mouth 2 (two) times daily. 60 tablet 11   Salicylic Acid (GOLD BOND PSORIASIS RELIEF) 3 % CREA Apply 1 application topically daily as needed (psoriasis).     silodosin (RAPAFLO) 8 MG CAPS capsule Take 8 mg by mouth daily with breakfast.     SUPER B COMPLEX/C PO Take 1 tablet by mouth every evening.     tamsulosin  (FLOMAX ) 0.4 MG CAPS capsule Take 0.4 mg by mouth daily after supper.     traMADol  (ULTRAM ) 50 MG tablet Take 50 mg by mouth 2 (two) times daily as needed for moderate pain (pain score 4-6).     No current facility-administered medications for this visit.    Review of Systems: Constitutional:  Denies fevers, chills or abnormal night sweats Eyes: Denies blurriness of vision, double vision or watery eyes Ears, nose, mouth, throat, and face: Denies mucositis or sore throat Respiratory: Denies cough, dyspnea or wheezes Cardiovascular: Denies palpitation, chest discomfort or lower extremity swelling Gastrointestinal:  Denies nausea, heartburn or change in bowel habits Skin: Denies abnormal skin rashes Lymphatics: Denies new  lymphadenopathy or easy bruising Neurological:Denies numbness, tingling or new weaknesses Behavioral/Psych: Mood is stable, no new changes  All other systems were reviewed with the patient and are negative.  Physical Examination: ECOG PERFORMANCE STATUS: 1 - Symptomatic but completely ambulatory  Vitals:   10/01/24 1343 10/01/24 1345  BP: (!) 174/68 (!) 159/58  Pulse: 64   Resp: 18   Temp: 98.3 F (36.8 C)   SpO2: 97%    Filed Weights   10/01/24 1343  Weight: 148 lb 11.2 oz (67.4 kg)    GENERAL:alert, no distress and comfortable SKIN: skin color, texture, turgor are normal, no rashes or significant lesions EYES: normal, conjunctiva are pink and non-injected, sclera clear LYMPH:  no palpable lymphadenopathy in the cervical, axillary or inguinal LUNGS: clear to auscultation and percussion with normal breathing effort HEART: regular rate & rhythm and no murmurs and no lower extremity edema ABDOMEN:abdomen soft, non-tender and normal bowel sounds Musculoskeletal:no cyanosis of digits and no clubbing  PSYCH: alert & oriented x 3 with fluent speech  Laboratory Data: I have reviewed the data as listed Lab Results  Component Value Date   WBC 13.7 (H) 08/21/2024   HGB 13.1 08/21/2024   HCT 38.4 (L) 08/21/2024   MCV 90.4 08/21/2024   PLT 236 08/21/2024   Recent Labs    07/28/24 1430 08/12/24 1409 08/20/24 0205 08/21/24 0223  NA 136 138 136 132*  K 4.4 4.3 4.8 4.6  CL 98 102 102 97*  CO2 28 24 25 25   GLUCOSE 94 81 121* 132*  BUN 9 13 14 11    CREATININE 0.72 1.07 0.91 0.73  CALCIUM  9.0 8.9 8.5* 8.8*  GFRNONAA >60 >60 >60 >60  PROT 7.4 6.8  --  6.9  ALBUMIN  3.8 3.3*  --  3.4*  AST 22 20  --  31  ALT 17 15  --  15  ALKPHOS 104 103  --  86  BILITOT 0.5 0.5  --  0.9    Radiographic Studies: I have personally reviewed the radiological images as listed and agreed with the findings in the report.  CLINICAL DATA:  Initial treatment strategy for dominant right lower lobe mass with additional pulmonary nodules on lung cancer screening chest CT.   EXAM: NUCLEAR MEDICINE PET SKULL BASE TO THIGH   TECHNIQUE: 7.22 mCi F-18 FDG was injected intravenously. Full-ring PET imaging was performed from the skull base to thigh after the radiotracer. CT data was obtained and used for attenuation correction and anatomic localization.   Fasting blood glucose: 93 mg/dl   COMPARISON:  Chest CT 04/11/2024   FINDINGS: Mediastinal blood pool activity: SUV max 2.1   NECK:   No hypermetabolic cervical lymph nodes are identified. No suspicious activity identified within the pharyngeal mucosal space.   Incidental CT findings: Severe bilateral carotid atherosclerosis.   CHEST:   There are no hypermetabolic mediastinal, hilar or axillary lymph nodes. There is marked hypermetabolic activity within the previously demonstrated spiculated right lower lobe mass. This mass measures 3.4 x 2.9 cm on image 50/7 and has an SUV max of 16.2. Additional scattered pulmonary nodules are grossly stable, without additional hypermetabolic activity.   Incidental CT findings: Left subclavian pacemaker. Diffuse atherosclerosis of the aorta, great vessels and coronary arteries. Moderate centrilobular and paraseptal emphysema.   ABDOMEN/PELVIS:   There is no hypermetabolic activity within the liver, adrenal glands, spleen or pancreas. There is no hypermetabolic nodal activity in the abdomen or pelvis.   Incidental CT findings: Diffuse aortic and  branch  vessel atherosclerosis. Bilateral iliac and right femoral arterial stents.   SKELETON:   There is no hypermetabolic activity to suggest osseous metastatic disease.   Incidental CT findings: Right shoulder arthroplasty. Mild multilevel spondylosis.   IMPRESSION: 1. The previously demonstrated spiculated right lower lobe mass is hypermetabolic, consistent with primary bronchogenic carcinoma. 2. No evidence of metastatic disease. Additional scattered pulmonary nodules are grossly stable, without hypermetabolic activity. Although likely benign, these are suboptimally evaluated based on size. Attention on follow-up recommended. 3. Aortic Atherosclerosis (ICD10-I70.0) and Emphysema (ICD10-J43.9).     Electronically Signed   By: Elsie Perone M.D.   On: 07/02/2024 14:27    ASSESSMENT & PLAN:  Patient is a 78 y.o. male presenting for non-small cell lung carcinoma s/p lobectomy  Assessment and Plan Assessment & Plan Non-small cell lung cancer, squamous cell type, status post lobectomy Status post resection of 3.8 cm squamous cell carcinoma.Stage Ib (pT2a,PN0) No high-risk features.   - Considering patient has stage Ib non-small cell lung carcinoma with no high risk features, chemotherapy is not indicated.  - Discussed the role of [employer] NGS testing and probable adjuvant targeted therapies if patient has any mutations.  Will send for [facility] NGS testing on his pathology specimen.   - Discussed recurrence risk and future chemotherapy possibility. Smoking cessation noted. -Will do surveillance with scheduled CT scans every 3 to 6 months for 2 years, then yearly. - Will obtain brain MRI.  Return to clinic after the above results to discuss further management.  Tobacco use Patient is a chronic former smoker.  Quit prior to surgery.  - Encouraged patient's tobacco cessation.    Orders Placed This Encounter  Procedures   MR Brain W Wo Contrast    Standing Status:   Future     Expected Date:   10/08/2024    Expiration Date:   10/01/2025    If indicated for the ordered procedure, I authorize the administration of contrast media per Radiology protocol:   Yes    What is the patient's sedation requirement?:   No Sedation    Does the patient have a pacemaker or implanted devices?:   No    Use SRS Protocol?:   No    Preferred imaging location?:   [facility] (table limit - 500lbs)    Release to patient:   Immediate    The total time spent in the appointment was 40 minutes encounter with patients including review of chart and various tests results, discussions about plan of care and coordination of care plan   All questions were answered. The patient knows to call the clinic with any problems, questions or concerns. No barriers to learning was detected.  Mickiel Dry, Roy [name] 11/7/20254:15 PM "

## 2024-10-01 NOTE — Patient Instructions (Addendum)
 [facility]  Discharge Instructions  You were seen and examined today by Dr. Davonna. Dr. Davonna is a medical oncologist, meaning that she specializes in the treatment of cancer diagnoses. Dr. Davonna discussed your past medical history, family history of cancers, and the events that led to you being here today.  You were referred to Dr. Davonna due to a new diagnosis of lung cancer (squamous cell carcinoma). This was completely removed during surgery, so we will continue to watch you closely.   To complete the work-up, we will send mutation testing on your cancer and order a brain MRI.  Follow-up as scheduled.  Thank you for choosing [name] to provide your oncology and hematology care.   To afford each patient quality time with our provider, please arrive at least 15 minutes before your scheduled appointment time. You may need to reschedule your appointment if you arrive late (10 or more minutes). Arriving late affects you and other patients whose appointments are after yours.  Also, if you miss three or more appointments without notifying the office, you may be dismissed from the clinic at the provider's discretion.    Again, thank you for choosing [facility].  Our hope is that these requests will decrease the amount of time that you wait before being seen by our physicians.   If you have a lab appointment with the Cancer Center - please note that after April 8th, all labs will be drawn in the cancer center.  You do not have to check in or register with the main entrance as you have in the past but will complete your check-in at the cancer center.            _____________________________________________________________  Should you have questions after your visit to [facility], please contact our office at [phone] and follow the prompts.  Our office hours are 8:00 a.m. to 4:30 p.m. Monday - Thursday  and 8:00 a.m. to 2:30 p.m. Friday.  Please note that voicemails left after 4:00 p.m. may not be returned until the following business day.  We are closed weekends and all major holidays.  You do have access to a nurse 24-7, just call the main number to the clinic [phone] and do not press any options, hold on the line and a nurse will answer the phone.    For prescription refill requests, have your pharmacy contact our office and allow 72 hours.    Masks are no longer required in the cancer centers. If you would like for your care team to wear a mask while they are taking care of you, please let them know. You may have one support person who is at least 77 years old accompany you for your appointments.

## 2024-10-02 DIAGNOSIS — C3492 Malignant neoplasm of unspecified part of left bronchus or lung: Secondary | ICD-10-CM | POA: Insufficient documentation

## 2024-10-05 ENCOUNTER — Other Ambulatory Visit: Payer: Self-pay

## 2024-10-05 DIAGNOSIS — C3431 Malignant neoplasm of lower lobe, right bronchus or lung: Secondary | ICD-10-CM | POA: Diagnosis not present

## 2024-10-05 DIAGNOSIS — C3490 Malignant neoplasm of unspecified part of unspecified bronchus or lung: Secondary | ICD-10-CM

## 2024-10-05 DIAGNOSIS — C782 Secondary malignant neoplasm of pleura: Secondary | ICD-10-CM | POA: Diagnosis not present

## 2024-10-05 DIAGNOSIS — C7989 Secondary malignant neoplasm of other specified sites: Secondary | ICD-10-CM | POA: Diagnosis not present

## 2024-10-05 DIAGNOSIS — R918 Other nonspecific abnormal finding of lung field: Secondary | ICD-10-CM

## 2024-10-06 DIAGNOSIS — R7303 Prediabetes: Secondary | ICD-10-CM | POA: Diagnosis not present

## 2024-10-06 DIAGNOSIS — E782 Mixed hyperlipidemia: Secondary | ICD-10-CM | POA: Diagnosis not present

## 2024-10-06 DIAGNOSIS — Z125 Encounter for screening for malignant neoplasm of prostate: Secondary | ICD-10-CM | POA: Diagnosis not present

## 2024-10-12 ENCOUNTER — Inpatient Hospital Stay: Admitting: Oncology

## 2024-10-12 ENCOUNTER — Inpatient Hospital Stay

## 2024-10-12 DIAGNOSIS — E782 Mixed hyperlipidemia: Secondary | ICD-10-CM | POA: Diagnosis not present

## 2024-10-12 DIAGNOSIS — Z902 Acquired absence of lung [part of]: Secondary | ICD-10-CM | POA: Diagnosis not present

## 2024-10-12 DIAGNOSIS — I10 Essential (primary) hypertension: Secondary | ICD-10-CM | POA: Diagnosis not present

## 2024-10-12 DIAGNOSIS — Z0000 Encounter for general adult medical examination without abnormal findings: Secondary | ICD-10-CM | POA: Diagnosis not present

## 2024-10-12 DIAGNOSIS — N401 Enlarged prostate with lower urinary tract symptoms: Secondary | ICD-10-CM | POA: Diagnosis not present

## 2024-10-12 DIAGNOSIS — C3491 Malignant neoplasm of unspecified part of right bronchus or lung: Secondary | ICD-10-CM | POA: Diagnosis not present

## 2024-10-12 DIAGNOSIS — E875 Hyperkalemia: Secondary | ICD-10-CM | POA: Diagnosis not present

## 2024-10-12 DIAGNOSIS — C7989 Secondary malignant neoplasm of other specified sites: Secondary | ICD-10-CM | POA: Diagnosis not present

## 2024-10-12 DIAGNOSIS — D509 Iron deficiency anemia, unspecified: Secondary | ICD-10-CM | POA: Diagnosis not present

## 2024-10-12 DIAGNOSIS — I5022 Chronic systolic (congestive) heart failure: Secondary | ICD-10-CM | POA: Diagnosis not present

## 2024-10-12 DIAGNOSIS — I739 Peripheral vascular disease, unspecified: Secondary | ICD-10-CM | POA: Diagnosis not present

## 2024-10-12 DIAGNOSIS — Z23 Encounter for immunization: Secondary | ICD-10-CM | POA: Diagnosis not present

## 2024-10-12 DIAGNOSIS — C782 Secondary malignant neoplasm of pleura: Secondary | ICD-10-CM | POA: Diagnosis not present

## 2024-10-12 DIAGNOSIS — I77819 Aortic ectasia, unspecified site: Secondary | ICD-10-CM | POA: Diagnosis not present

## 2024-10-12 DIAGNOSIS — C3431 Malignant neoplasm of lower lobe, right bronchus or lung: Secondary | ICD-10-CM | POA: Diagnosis not present

## 2024-10-14 ENCOUNTER — Encounter (HOSPITAL_COMMUNITY): Payer: Self-pay

## 2024-10-14 ENCOUNTER — Ambulatory Visit: Admitting: Urology

## 2024-10-14 ENCOUNTER — Other Ambulatory Visit: Payer: Self-pay | Admitting: *Deleted

## 2024-10-14 DIAGNOSIS — I739 Peripheral vascular disease, unspecified: Secondary | ICD-10-CM

## 2024-10-14 DIAGNOSIS — I70213 Atherosclerosis of native arteries of extremities with intermittent claudication, bilateral legs: Secondary | ICD-10-CM

## 2024-10-15 ENCOUNTER — Ambulatory Visit (HOSPITAL_COMMUNITY)
Admission: RE | Admit: 2024-10-15 | Discharge: 2024-10-15 | Disposition: A | Discharge status: Home / Self Care | Source: Ambulatory Visit | Attending: Oncology | Admitting: Oncology

## 2024-10-15 DIAGNOSIS — R918 Other nonspecific abnormal finding of lung field: Secondary | ICD-10-CM | POA: Diagnosis not present

## 2024-10-15 DIAGNOSIS — C3490 Malignant neoplasm of unspecified part of unspecified bronchus or lung: Secondary | ICD-10-CM | POA: Insufficient documentation

## 2024-10-15 MED ORDER — IOHEXOL 300 MG/ML  SOLN
75.0000 mL | Freq: Once | INTRAMUSCULAR | Status: AC | PRN
  Administered 2024-10-15: 75 mL via INTRAVENOUS

## 2024-10-20 ENCOUNTER — Encounter (HOSPITAL_COMMUNITY): Payer: Self-pay

## 2024-10-20 ENCOUNTER — Other Ambulatory Visit: Payer: Self-pay

## 2024-10-20 ENCOUNTER — Encounter (HOSPITAL_COMMUNITY)
Admission: RE | Admit: 2024-10-20 | Discharge: 2024-10-20 | Disposition: A | Discharge status: Home / Self Care | Source: Ambulatory Visit | Attending: Urology | Admitting: Urology

## 2024-10-20 NOTE — Pre-Procedure Instructions (Signed)
 Attempted pre-op phone call. Left VM for him to call us back.

## 2024-10-25 NOTE — Progress Notes (Deleted)
 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 male with non small cell lung carcinoma  Non-small cell lung cancer, squamous cell type, status post lobectomy Status post resection of 3.8 cm squamous cell carcinoma.Stage Ib (pT2a,PN0) No high-risk features.    - Considering patient has stage Ib non-small cell lung carcinoma with no high risk features, chemotherapy is not indicated.  - Discussed the role of [facility] NGS testing and probable adjuvant targeted therapies if patient has any mutations.  Will send for [facility] NGS testing on his pathology specimen.   - Discussed recurrence risk and future chemotherapy possibility. Smoking cessation noted. -Will do surveillance with scheduled CT scans every 3 to 6 months for 2 years, then yearly. - Will obtain brain MRI.   Return to clinic after the above results to discuss further management.   Tobacco use Patient is a chronic former smoker.  Quit prior to surgery.   - Encouraged patient's tobacco cessation.    The patient understands the plans discussed today and is in agreement with them.  He knows to contact our office if he develops concerns prior to his next appointment.  *** minutes of total time was spent for this patient encounter, including preparation,face-to-face counseling with the patient and  coordination of care, physical exam, and documentation of the encounter.   Siad Davonna, MD  [facility] [place] - A DEPT OF [facility] [address] [place] [address] Dept: [phone] Dept Fax: [phone]   No orders of the defined types were placed in this encounter.    ONCOLOGY HISTORY:   Diagnosis: Non small cell lung cancer- Squamous cell carcinoma   -04/11/2024: CT Lung Cancer Screening:  Lung-RADS 4B, suspicious. Dominant 3.1 cm irregular spiculated mass in the right lower lobe. Additional scattered bilateral pulmonary nodules range in size from 5 mm to a 9.2 mm.  -07/02/2024: Initial PET: The previously demonstrated spiculated right lower lobe mass is hypermetabolic, consistent with primary bronchogenic carcinoma. No evidence of metastatic disease. Additional scattered pulmonary nodules are grossly stable, without hypermetabolic activity. -07/30/2024: Bronchoscopy with RLL Lung mass FNA and Lymph node (7 and 11R) FNA. Cytology: Squamous cell carcinoma. Lymph nodes negative for malignancy.  -08/19/2024: Right lower lobectomy.  Pathology: Well to moderately differentiated squamous cell carcinoma, 3.8 cm. Carcinoma invades visceral pleura. Bronchial margin and vascular margin are negative for carcinoma. Lymphovascular invasion identified. Eleven lymph nodes, negative for metastatic carcinoma (0/11). Staging: pT2a, pN0  -10/19/2024: CT head: Normal (MRI could not be done due to metal in the body)  Current Treatment:  ***  INTERVAL HISTORY:   Discussed the use of AI scribe software for clinical note transcription with the patient, who gave verbal consent to proceed.  History of Present Illness     I have reviewed the past medical history, past surgical history, social history and family  history with the patient and they are unchanged from previous note.  ALLERGIES:  has no known allergies.  MEDICATIONS:   Current Outpatient Medications  Medication Sig Dispense Refill   albuterol  (VENTOLIN  HFA) 108 (90 Base) MCG/ACT inhaler Inhale 1 puff into the lungs every 6 (six) hours as needed for wheezing or shortness of breath.     aspirin  EC 81 MG tablet Take 81 mg by mouth every evening.      atorvastatin  (LIPITOR ) 20 MG tablet Take 20 mg by mouth every evening.     B Complex-C (SUPER B COMPLEX PO) Take 1 tablet by mouth daily.     BRILINTA  90 MG TABS tablet Take 90 mg by mouth 2 (two) times daily.     carvedilol  (COREG ) 6.25 MG tablet TAKE 1 TABLET TWICE A DAY WITH MEALS (DOSE CHANGE, NEW PRESCRIPTION) 180 tablet 2   cetirizine (ZYRTEC) 10 MG tablet Take 10 mg by mouth daily.     clotrimazole -betamethasone  (LOTRISONE ) cream Apply 1 Application topically 2 (two) times daily. (Patient taking differently: Apply 1 Application topically 2 (two) times daily as needed (Itching/Rash).) 30 g 3   Cobalamin Combinations (NEURIVA PLUS) CAPS Take 1 capsule by mouth in the morning.     dextromethorphan -guaiFENesin  (MUCINEX  DM) 30-600 MG 12hr tablet Take 1 tablet by mouth 2 (two) times daily.     gabapentin  (NEURONTIN ) 300 MG capsule Take 300 mg by mouth 2 (two) times daily.     HYDROcodone -acetaminophen  (NORCO) 10-325 MG tablet Take 1 tablet by mouth every 6 (six) hours as needed for moderate pain (pain score 4-6).     Magnesium  250 MG TABS Take 250 mg by mouth every evening.     Menthol, Topical Analgesic, (ICY HOT) 7.5 % (Roll) MISC Apply 1 each topically daily as needed (pain).     methocarbamol  (ROBAXIN ) 500 MG tablet Take 1 tablet (500 mg total) by mouth every 8 (eight) hours as needed for muscle spasms. (Patient not taking: Reported on 10/15/2024) 15 tablet 3   Multiple Vitamins-Minerals (CENTRUM SILVER PO) Take 1 tablet by mouth daily. Men 50+     Naphazoline HCl (CLEAR EYES OP) Place 1 drop into both eyes daily as needed (dry eyes).     Omega-3 Fatty Acids (FISH OIL ) 1000 MG CAPS Take 1,000 mg by mouth every  evening. (Patient taking differently: Take 1,000 mg by mouth daily.)     OXYGEN  Inhale 2 L into the lungs continuous.     sacubitril -valsartan  (ENTRESTO ) 97-103 MG Take 1 tablet by mouth 2 (two) times daily. 60 tablet 11   Salicylic Acid (GOLD BOND PSORIASIS RELIEF) 3 % CREA Apply 1 application topically daily as needed (psoriasis).     silodosin (RAPAFLO) 8 MG CAPS capsule Take 8 mg by mouth daily with breakfast.     tamsulosin  (FLOMAX ) 0.4 MG CAPS capsule Take 0.4 mg by mouth daily after supper.     traMADol  (ULTRAM ) 50 MG tablet Take 50 mg by mouth 2 (two) times daily.     No current facility-administered medications for this visit.    VITALS:  There were no vitals taken for this visit.  Wt Readings from Last 3 Encounters:  10/20/24 148 lb 11.2 oz (67.4 kg)  10/01/24 148 lb 11.2 oz (67.4 kg)  09/22/24 149 lb (67.6 kg)    There is no height or weight on file to calculate BMI.  Performance status (ECOG): {CHL ONC [id number]  PHYSICAL EXAM:   GENERAL:alert, no distress and comfortable SKIN: skin color,  texture, turgor are normal, no rashes or significant lesions EYES: normal, Conjunctiva are pink and non-injected, sclera clear OROPHARYNX:no exudate, no erythema and lips, buccal mucosa, and tongue normal  NECK: supple, thyroid  normal size, non-tender, without nodularity LYMPH:  no palpable lymphadenopathy in the cervical, axillary or inguinal LUNGS: clear to auscultation and percussion with normal breathing effort HEART: regular rate & rhythm and no murmurs and no lower extremity edema ABDOMEN:abdomen soft, non-tender and normal bowel sounds Musculoskeletal:no cyanosis of digits and no clubbing  NEURO: alert & oriented x 3 with fluent speech, no focal motor/sensory deficits  LABORATORY DATA:  I have reviewed the data as listed     Component Value Date/Time   NA 132 (L) 08/21/2024 0223   NA 136 11/05/2023 1034   K 4.6 08/21/2024 0223   K 4.2 04/16/2012 0807   CL 97 (L)  08/21/2024 0223   CL 99 04/16/2012 0807   CO2 25 08/21/2024 0223   CO2 26 04/16/2012 0807   GLUCOSE 132 (H) 08/21/2024 0223   BUN 11 08/21/2024 0223   BUN 14 11/05/2023 1034   CREATININE 0.73 08/21/2024 0223   CREATININE 0.64 (L) 11/07/2021 0936   CALCIUM  8.8 (L) 08/21/2024 0223   CALCIUM  9.0 04/16/2012 0807   PROT 6.9 08/21/2024 0223   PROT 6.6 04/16/2012 0807   ALBUMIN  3.4 (L) 08/21/2024 0223   ALBUMIN  4.0 04/16/2012 0807   AST 31 08/21/2024 0223   AST 120 04/16/2012 0807   ALT 15 08/21/2024 0223   ALKPHOS 86 08/21/2024 0223   ALKPHOS 140 04/16/2012 0807   BILITOT 0.9 08/21/2024 0223   BILITOT 0.5 04/16/2012 0807   GFRNONAA >60 08/21/2024 0223   GFRNONAA >89 03/03/2014 0957   GFRAA >60 04/06/2020 1605   GFRAA >89 03/03/2014 0957     Lab Results  Component Value Date   WBC 13.7 (H) 08/21/2024   NEUTROABS 4,066 11/10/2019   HGB 13.1 08/21/2024   HCT 38.4 (L) 08/21/2024   MCV 90.4 08/21/2024   PLT 236 08/21/2024      Chemistry      Component Value Date/Time   NA 132 (L) 08/21/2024 0223   NA 136 11/05/2023 1034   K 4.6 08/21/2024 0223   K 4.2 04/16/2012 0807   CL 97 (L) 08/21/2024 0223   CL 99 04/16/2012 0807   CO2 25 08/21/2024 0223   CO2 26 04/16/2012 0807   BUN 11 08/21/2024 0223   BUN 14 11/05/2023 1034   CREATININE 0.73 08/21/2024 0223   CREATININE 0.64 (L) 11/07/2021 0936      Component Value Date/Time   CALCIUM  8.8 (L) 08/21/2024 0223   CALCIUM  9.0 04/16/2012 0807   ALKPHOS 86 08/21/2024 0223   ALKPHOS 140 04/16/2012 0807   AST 31 08/21/2024 0223   AST 120 04/16/2012 0807   ALT 15 08/21/2024 0223   BILITOT 0.9 08/21/2024 0223   BILITOT 0.5 04/16/2012 0807        RADIOGRAPHIC STUDIES: I have personally reviewed the radiological images as listed and agreed with the findings in the report. CT HEAD W & WO CONTRAST ( ) CLINICAL DATA:  Non-small-cell lung cancer staging  EXAM: CT HEAD WITHOUT AND WITH CONTRAST  TECHNIQUE: Contiguous axial  images were obtained from the base of the skull through the vertex without and with intravenous contrast.  RADIATION DOSE REDUCTION: This exam was performed according to the departmental dose-optimization program which includes automated exposure control, adjustment of the mA and/or kV according to patient size and/or use  of iterative reconstruction technique.  CONTRAST:  75mL OMNIPAQUE  IOHEXOL  300 MG/ML  SOLN  COMPARISON:  None Available.  FINDINGS: CT HEAD:  Attenuation in the brain parenchyma is normal. No abnormal enhancement.  There is no hemorrhage.  No acute ischemic changes.  No mass lesion.  The ventricles are normal.  Skull/sinuses/orbits:  No significant abnormality.  IMPRESSION: Normal  Electronically Signed   By: Nancyann Burns M.D.   On: 10/19/2024 08:37

## 2024-10-26 ENCOUNTER — Encounter (HOSPITAL_COMMUNITY)
Admission: RE | Disposition: A | Discharge status: Home / Self Care | Payer: Self-pay | Source: Home / Self Care | Attending: Urology

## 2024-10-26 ENCOUNTER — Inpatient Hospital Stay: Admitting: Oncology

## 2024-10-26 ENCOUNTER — Encounter (HOSPITAL_COMMUNITY): Payer: Self-pay

## 2024-10-26 ENCOUNTER — Encounter (HOSPITAL_COMMUNITY): Payer: Self-pay | Admitting: Urology

## 2024-10-26 ENCOUNTER — Ambulatory Visit (HOSPITAL_COMMUNITY): Payer: Self-pay

## 2024-10-26 ENCOUNTER — Ambulatory Visit (HOSPITAL_COMMUNITY)
Admission: RE | Admit: 2024-10-26 | Discharge: 2024-10-26 | Disposition: A | Discharge status: Home / Self Care | Attending: Urology | Admitting: Urology

## 2024-10-26 DIAGNOSIS — N471 Phimosis: Secondary | ICD-10-CM

## 2024-10-26 DIAGNOSIS — N480 Leukoplakia of penis: Secondary | ICD-10-CM

## 2024-10-26 DIAGNOSIS — Z95810 Presence of automatic (implantable) cardiac defibrillator: Secondary | ICD-10-CM | POA: Diagnosis not present

## 2024-10-26 DIAGNOSIS — I252 Old myocardial infarction: Secondary | ICD-10-CM | POA: Diagnosis not present

## 2024-10-26 DIAGNOSIS — I2510 Atherosclerotic heart disease of native coronary artery without angina pectoris: Secondary | ICD-10-CM | POA: Diagnosis not present

## 2024-10-26 DIAGNOSIS — Z85118 Personal history of other malignant neoplasm of bronchus and lung: Secondary | ICD-10-CM | POA: Diagnosis not present

## 2024-10-26 DIAGNOSIS — J449 Chronic obstructive pulmonary disease, unspecified: Secondary | ICD-10-CM | POA: Diagnosis not present

## 2024-10-26 DIAGNOSIS — M1990 Unspecified osteoarthritis, unspecified site: Secondary | ICD-10-CM | POA: Diagnosis not present

## 2024-10-26 DIAGNOSIS — I110 Hypertensive heart disease with heart failure: Secondary | ICD-10-CM | POA: Diagnosis not present

## 2024-10-26 DIAGNOSIS — I509 Heart failure, unspecified: Secondary | ICD-10-CM | POA: Diagnosis not present

## 2024-10-26 DIAGNOSIS — Z87891 Personal history of nicotine dependence: Secondary | ICD-10-CM | POA: Diagnosis not present

## 2024-10-26 DIAGNOSIS — I739 Peripheral vascular disease, unspecified: Secondary | ICD-10-CM | POA: Diagnosis not present

## 2024-10-26 HISTORY — PX: CIRCUMCISION: SHX1350

## 2024-10-26 SURGERY — CIRCUMCISION, ADULT
Anesthesia: General | Site: Penis

## 2024-10-26 MED ORDER — 0.9 % SODIUM CHLORIDE (POUR BTL) OPTIME
TOPICAL | Status: DC | PRN
  Administered 2024-10-26: 1000 mL

## 2024-10-26 MED ORDER — ONDANSETRON HCL 4 MG/2ML IJ SOLN
INTRAMUSCULAR | Status: AC
  Filled 2024-10-26: qty 2

## 2024-10-26 MED ORDER — LACTATED RINGERS IV SOLN: INTRAVENOUS | Status: DC

## 2024-10-26 MED ORDER — ORAL CARE MOUTH RINSE: 15.0000 mL | Freq: Once | OROMUCOSAL | Status: AC

## 2024-10-26 MED ORDER — ONDANSETRON HCL 4 MG/2ML IJ SOLN
INTRAMUSCULAR | Status: DC | PRN
  Administered 2024-10-26: 4 mg via INTRAVENOUS

## 2024-10-26 MED ORDER — BUPIVACAINE HCL (PF) 0.25 % IJ SOLN
INTRAMUSCULAR | Status: AC
  Filled 2024-10-26: qty 30

## 2024-10-26 MED ORDER — BACITRACIN ZINC 500 UNIT/GM EX OINT: TOPICAL_OINTMENT | CUTANEOUS | 0 refills | Status: AC

## 2024-10-26 MED ORDER — HYDROCODONE-ACETAMINOPHEN 5-325 MG PO TABS: 1.0000 | ORAL_TABLET | Freq: Four times a day (QID) | ORAL | 0 refills | Status: AC | PRN

## 2024-10-26 MED ORDER — OXYCODONE HCL 5 MG PO TABS: 5.0000 mg | ORAL_TABLET | Freq: Once | ORAL | Status: DC | PRN

## 2024-10-26 MED ORDER — OXYCODONE HCL 5 MG/5ML PO SOLN: 5.0000 mg | Freq: Once | ORAL | Status: DC | PRN

## 2024-10-26 MED ORDER — PHENYLEPHRINE 80 MCG/ML (10ML) SYRINGE FOR IV PUSH (FOR BLOOD PRESSURE SUPPORT)
PREFILLED_SYRINGE | INTRAVENOUS | Status: DC | PRN
  Administered 2024-10-26 (×2): 80 ug via INTRAVENOUS

## 2024-10-26 MED ORDER — BUPIVACAINE HCL (PF) 0.25 % IJ SOLN
INTRAMUSCULAR | Status: DC | PRN
  Administered 2024-10-26: 10 mL

## 2024-10-26 MED ORDER — FENTANYL CITRATE (PF) 100 MCG/2ML IJ SOLN
INTRAMUSCULAR | Status: DC | PRN
  Administered 2024-10-26: 25 ug via INTRAVENOUS
  Administered 2024-10-26: 75 ug via INTRAVENOUS

## 2024-10-26 MED ORDER — FENTANYL CITRATE (PF) 50 MCG/ML IJ SOSY: 25.0000 ug | PREFILLED_SYRINGE | INTRAMUSCULAR | Status: DC | PRN

## 2024-10-26 MED ORDER — LIDOCAINE 2% (20 MG/ML) 5 ML SYRINGE
INTRAMUSCULAR | Status: DC | PRN
  Administered 2024-10-26: 50 mg via INTRAVENOUS

## 2024-10-26 MED ORDER — PROPOFOL 10 MG/ML IV BOLUS
INTRAVENOUS | Status: DC | PRN
  Administered 2024-10-26: 120 mg via INTRAVENOUS

## 2024-10-26 MED ORDER — FENTANYL CITRATE (PF) 100 MCG/2ML IJ SOLN
INTRAMUSCULAR | Status: AC
  Filled 2024-10-26: qty 2

## 2024-10-26 MED ORDER — LIDOCAINE 2% (20 MG/ML) 5 ML SYRINGE
INTRAMUSCULAR | Status: AC
  Filled 2024-10-26: qty 5

## 2024-10-26 MED ORDER — CHLORHEXIDINE GLUCONATE 0.12 % MT SOLN
OROMUCOSAL | Status: AC
  Administered 2024-10-26: 15 mL via OROMUCOSAL
  Filled 2024-10-26: qty 15

## 2024-10-26 MED ORDER — CEFAZOLIN SODIUM-DEXTROSE 2-4 GM/100ML-% IV SOLN
2.0000 g | INTRAVENOUS | Status: AC
  Administered 2024-10-26: 2 g via INTRAVENOUS
  Filled 2024-10-26: qty 100

## 2024-10-26 MED ORDER — CHLORHEXIDINE GLUCONATE 0.12 % MT SOLN: 15.0000 mL | Freq: Once | OROMUCOSAL | Status: AC

## 2024-10-26 MED ORDER — PROPOFOL 10 MG/ML IV BOLUS
INTRAVENOUS | Status: AC
  Filled 2024-10-26: qty 20

## 2024-10-26 SURGICAL SUPPLY — 22 items
BNDG COHESIVE 2X5 TAN ST LF (GAUZE/BANDAGES/DRESSINGS) ×2 IMPLANT
COVER LIGHT HANDLE (MISCELLANEOUS) IMPLANT
ELECT NDL BLADE 2-5/6 (NEEDLE) ×2 IMPLANT
ELECTRODE REM PT RTRN 9FT ADLT (ELECTROSURGICAL) ×2 IMPLANT
GAUZE SPONGE 4X4 12PLY STRL (GAUZE/BANDAGES/DRESSINGS) ×2 IMPLANT
GAUZE STRETCH 2X75IN STRL (MISCELLANEOUS) ×2 IMPLANT
GAUZE XEROFORM 1X8 LF (GAUZE/BANDAGES/DRESSINGS) ×2 IMPLANT
GLOVE BIO SURGEON STRL SZ8 (GLOVE) ×2 IMPLANT
GLOVE BIOGEL PI IND STRL 7.0 (GLOVE) ×4 IMPLANT
GLOVE BIOGEL PI IND STRL 8 (GLOVE) ×2 IMPLANT
GOWN STRL REUS W/TWL LRG LVL3 (GOWN DISPOSABLE) ×2 IMPLANT
GOWN STRL REUS W/TWL XL LVL3 (GOWN DISPOSABLE) ×2 IMPLANT
KIT TURNOVER KIT A (KITS) ×2 IMPLANT
NDL HYPO 25X1 1.5 SAFETY (NEEDLE) ×2 IMPLANT
PACK MINOR (CUSTOM PROCEDURE TRAY) ×2 IMPLANT
PAD ARMBOARD POSITIONER FOAM (MISCELLANEOUS) ×2 IMPLANT
POSITIONER HEAD 8X9X4 ADT (SOFTGOODS) ×2 IMPLANT
SET BASIN LINEN APH (SET/KITS/TRAYS/PACK) ×2 IMPLANT
SOLN 0.9% NACL POUR BTL 1000ML (IV SOLUTION) ×2 IMPLANT
SPIKE FLUID TRANSFER (MISCELLANEOUS) ×2 IMPLANT
SUT VIC AB 4-0 SH 27XBRD (SUTURE) ×4 IMPLANT
SYR CONTROL 10ML LL (SYRINGE) ×2 IMPLANT

## 2024-10-26 NOTE — H&P (Signed)
 HPI: [name] is a 78yo here for followup for balanitis. He applied clotrimazole  which failed to improve his balanitis. He continues to have difficulty retracting his foreskin.      PMH:     Past Medical History:  Diagnosis Date   AICD (automatic cardioverter/defibrillator) present 08/19/2015    St Jude BiV ICD for primary prevention by Dr. Waddell   Alcohol abuse      6 beers per day; hospital admission in 2009 for withdrawal symptoms   Anxiety and depression      denies    Cerebrovascular disease 2009    TIA; 2009- right ICA stent; re-intervention for restenosis complicated by [facility] w/o sx   CHF (congestive heart failure) (HCC) 06/02/2014   Chronic obstructive pulmonary disease (HCC)     Degenerative joint disease      Total shoulder arthroplasty-right   Hyperlipidemia     Hypertension     LBBB (left bundle branch block)      Normal echo-2011; stress nuclear in 09/2010--septal hypoperfusion representing nontransmural infarction or the effect of left bundle branch block, no ischemia   Lung cancer (HCC)     Myocardial infarction (HCC) 06/02/2014    Massive Heart Attack   Peripheral vascular disease     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   Thrombocytopenia     Tobacco abuse      -100 pack years; 1.5 packs per day   Traumatic seroma of thigh      left   Tubular adenoma of colon            Surgical History:      Past Surgical History:  Procedure Laterality Date   ABDOMINAL AORTAGRAM [name] 05/04/2014    Procedure: ABDOMINAL EZELLA;  Surgeon: Gaile LELON New, MD;  Location: [facility] CATH LAB;  Service: Cardiovascular;  Laterality: [name];   ABDOMINAL AORTOGRAM [name] 06/25/2017    Procedure: Abdominal Aortogram;  Surgeon: New Gaile LELON, MD;  [facility]: [facility] INVASIVE CV LAB;  Service: Cardiovascular;  Laterality: [name];   ABDOMINAL AORTOGRAM W/LOWER EXTREMITY [name] 04/22/2018    Procedure: ABDOMINAL AORTOGRAM W/LOWER EXTREMITY;  Surgeon: New Gaile LELON, MD;  [facility]: [facility]  INVASIVE CV LAB;  Service: Cardiovascular;  Laterality: [name];   ABDOMINAL AORTOGRAM W/LOWER EXTREMITY [name] 03/08/2020    Procedure: ABDOMINAL AORTOGRAM W/LOWER EXTREMITY;  Surgeon: New Gaile LELON, MD;  [facility]VAS[facility] LAB;  Service: Cardiovascular;  Laterality: [name];   ABDOMINAL AORTOGRAM W/LOWER EXTREMITY Bilateral 08/23/2020    Procedure: ABDOMINAL AORTOGRAM W/LOWER EXTREMITY;  Surgeon: New Gaile LELON, MD;  [facility];  Service: Cardiovascular;  Laterality: Bilateral;   ABDOMINAL AORTOGRAM W/LOWER EXTREMITY [name] 10/31/2021    Procedure: ABDOMINAL AORTOGRAM W/LOWER EXTREMITY;  Surgeon: New Gaile LELON, MD;  [facility];  Service: Cardiovascular;  Laterality: [name];   ABDOMINAL AORTOGRAM W/LOWER EXTREMITY [name] 12/19/2021    Procedure: ABDOMINAL AORTOGRAM W/LOWER EXTREMITY;  Surgeon: New Gaile LELON, MD;  [facility];  Service: Cardiovascular;  Laterality: [name];   ABDOMINAL AORTOGRAM W/LOWER EXTREMITY Bilateral 02/19/2023    Procedure: ABDOMINAL AORTOGRAM W/LOWER EXTREMITY;  Surgeon: New Gaile LELON, MD;  [facility];  Service: Cardiovascular;  Laterality: Bilateral;   AGILE CAPSULE [name] 03/18/2014    Procedure: AGILE CAPSULE;  Surgeon: Margo LITTIE Haddock, MD;  Location: [facility] ENDO [facility]ITE;  Service: Endoscopy;  Laterality: [name];  7:30   APPLICATION OF WOUND VAC Left 09/03/2017    Procedure: APPLICATION OF WOUND VAC;  Surgeon:  Laurence Redell CROME, MD;  Location: [facility] OR;  Service: Vascular;  Laterality: Left;   APPLICATION OF WOUND VAC Right 04/07/2020    Procedure: PLACEMENT OF ANTIBIOTIC BEADS AND APPLICATION OF WOUND VAC;  Surgeon: Serene Gaile ORN, MD;  [facility]: [facility] OR;  Service: Vascular;  Laterality: Right;   BACK SURGERY       BACTERIAL OVERGROWTH TEST [name] 05/24/2015    Procedure: BACTERIAL OVERGROWTH TEST;  Surgeon: Margo CROME Haddock, MD;  [facility]: [facility];  Service: Endoscopy;  Laterality: [name];  0700   BI-VENTRICULAR IMPLANTABLE  CARDIOVERTER DEFIBRILLATOR  (CRT-D)   08/19/2015   BIV ICD GENERATOR CHANGEOUT [name] 11/11/2023    Procedure: BIV ICD GENERATOR CHANGEOUT;  Surgeon: Waddell Danelle ORN, MD;  Location: [facility] INVASIVE CV LAB;  Service: Cardiovascular;  Laterality: [name];   BRONCHIAL BIOPSY   07/30/2024    Procedure: BRONCHOSCOPY, WITH BIOPSY;  Surgeon: Kerrin Elspeth BROCKS, MD;  Location: [facility] ENDOSCOPY;  Service: Thoracic;;   BRONCHIAL NEEDLE ASPIRATION BIOPSY   07/30/2024    Procedure: BRONCHOSCOPY, WITH NEEDLE ASPIRATION BIOPSY;  Surgeon: Kerrin Elspeth BROCKS, MD;  Location: [facility] ENDOSCOPY;  Service: Thoracic;;   CARPAL TUNNEL RELEASE Left 02/02/2016    Procedure: LEFT CARPAL TUNNEL RELEASE;  Surgeon: Arley Curia, MD;  Location: [facility];  Service: Orthopedics;  Laterality: Left;  ANESTHESIA: IV REGIONAL UPPER ARM   CATARACT EXTRACTION W/PHACO Right 03/08/2015    Procedure: CATARACT EXTRACTION PHACO AND INTRAOCULAR LENS PLACEMENT (IOC);  Surgeon: Oneil Platts, MD;  Location: [facility] ORS;  Service: Ophthalmology;  Laterality: Right;  CDE:9.46   CATARACT EXTRACTION W/PHACO Left 03/22/2015    Procedure: CATARACT EXTRACTION PHACO AND INTRAOCULAR LENS PLACEMENT (IOC);  Surgeon: Oneil Platts, MD;  Location: [facility] ORS;  Service: Ophthalmology;  Laterality: Left;  CDE:5.80   COLONOSCOPY   08/22/09    Fields-(Tubular Adenoma)3-mm transverse polyp/4-mm polyp otherwise noraml/small internal hemorrhoids   COLONOSCOPY [name] 04/14/2014    DOQ:dfjoo internal hemorrhids/normal mocsa in the terminal iluem/left colonis redundant   COLONOSCOPY [name] 07/01/2024    Procedure: COLONOSCOPY;  Surgeon: Shaaron Lamar HERO, MD;  [facility];  Service: Endoscopy;  Laterality: [name];  900am, asa 3   COLONOSCOPY W/ POLYPECTOMY   2011   ENDARTERECTOMY FEMORAL Left 08/16/2017    Procedure: ENDARTERECTOMY LEFT PROFUNDA FEMORAL;  Surgeon: Serene Gaile ORN, MD;  Location: [facility] OR;  Service: Vascular;  Laterality: Left;   ENDARTERECTOMY FEMORAL Right 03/31/2020     Procedure: RIGHT FEMORAL ENDARTERECTOMY;  Surgeon: Serene Gaile ORN, MD;  [facility] OR;  Service: Vascular;  Laterality: Right;   EP IMPLANTABLE DEVICE [name] 08/19/2015    Procedure: BiV ICD Insertion CRT-D;  Surgeon: Danelle ORN Waddell, MD;  Location: [facility] INVASIVE CV LAB;  Service: Cardiovascular;  Laterality: [name];   ESOPHAGOGASTRODUODENOSCOPY [name] 03/05/2014    SLF: 1. Stricture at the gastroesophagael junction 2. Small hiatal hernia 3. Moderate non-erosive gastritis and duodentitis. 4. No surce for Melena identified.    FEMORAL-POPLITEAL BYPASS GRAFT Left 08/16/2017    Procedure: LEFT  FEMORAL-POPLITEAL ARTERY  BYPASS GRAFT;  Surgeon: Serene Gaile ORN, MD;  [facility] OR;  Service: Vascular;  Laterality: Left;   GIVENS CAPSULE STUDY [name] 03/30/2014    Procedure: GIVENS CAPSULE STUDY;  Surgeon: Margo CROME Haddock, MD;  [facility];  Service: Endoscopy;  Laterality: [name];  7:30   GROIN DEBRIDEMENT Right 04/07/2020    Procedure: INCISION AND DRAINAGE OF RIGHT GROIN;  Surgeon: Serene Gaile ORN, MD;  [facility] OR;  Service: Vascular;  Laterality: Right;   I & D EXTREMITY Left 09/03/2017    Procedure: IRRIGATION AND DEBRIDEMENT EXTREMITY LEFT THIGH SEROMA;  Surgeon: Laurence Redell CROME, MD;  [facility]: [facility] OR;  Service: Vascular;  Laterality: Left;   INSERT / REPLACE / REMOVE PACEMAKER       INSERTION OF ILIAC STENT   03/31/2020    Procedure: INSERTION OF RIGHT ILIAC ARTERY STENT AND RIGHT SUPERFICIAL FEMORAL ARTERY STENT;  Surgeon: Serene Gaile ORN, MD;  [facility] OR;  Service: Vascular;;   INTERCOSTAL NERVE BLOCK Right 08/19/2024    Procedure: BLOCK, NERVE, INTERCOSTAL;  Surgeon: Kerrin Elspeth BROCKS, MD;  [facility] OR;  Service: Thoracic;  Laterality: Right;   IR ANGIO INTRA EXTRACRAN SEL COM CAROTID INNOMINATE BILAT MOD SED   07/16/2017   IR ANGIO INTRA EXTRACRAN SEL COM CAROTID INNOMINATE BILAT MOD SED   06/12/2019   IR ANGIO INTRA EXTRACRAN SEL COM CAROTID INNOMINATE BILAT MOD SED   01/17/2021   IR  ANGIO VERTEBRAL SEL SUBCLAVIAN INNOMINATE BILAT MOD SED   07/16/2017   IR ANGIO VERTEBRAL SEL SUBCLAVIAN INNOMINATE BILAT MOD SED   06/12/2019   IR ANGIO VERTEBRAL SEL SUBCLAVIAN INNOMINATE BILAT MOD SED   01/17/2021   IR RADIOLOGIST EVAL & MGMT   04/01/2017   IR RADIOLOGIST EVAL & MGMT   08/02/2017   IR TRANSCATH EXCRAN VERT OR CAR A STENT   07/22/2019   IR US  GUIDE VASC ACCESS RIGHT   06/12/2019   IR US  GUIDE VASC ACCESS RIGHT   01/17/2021   JOINT REPLACEMENT Right      Total Shoulder Replacement    LOBECTOMY, LUNG, ROBOT-ASSISTED, USING VATS Right 08/19/2024    Procedure: RIGHT LOWER LOBECTOMY, LUNG, ROBOT-ASSISTED, USING VATS;  Surgeon: Kerrin Elspeth BROCKS, MD;  [facility] OR;  Service: Thoracic;  Laterality: Right;  ROBOTIC RIGHT LOWER LOBECTOMY   LOWER EXTREMITY ANGIOGRAPHY Bilateral 06/25/2017    Procedure: Lower Extremity Angiography;  Surgeon: Serene Gaile ORN, MD;  [facility] CV LAB;  Service: Cardiovascular;  Laterality: Bilateral;   LUMBAR FUSION   2010   LYMPH NODE BIOPSY Right 08/19/2024    Procedure: LYMPH NODE BIOPSY;  Surgeon: Kerrin Elspeth BROCKS, MD;  Location: [facility] OR;  Service: Thoracic;  Laterality: Right;   PATCH ANGIOPLASTY Right 03/31/2020    Procedure: PATCH ANGIOPLASTY USING XENOSURE BIOLOGIC PATCH 1cm x 14cm;  Surgeon: Serene Gaile ORN, MD;  Location: [facility] OR;  Service: Vascular;  Laterality: Right;   PERIPHERAL VASCULAR ATHERECTOMY Right 04/22/2018    Procedure: PERIPHERAL VASCULAR ATHERECTOMY;  Surgeon: Serene Gaile ORN, MD;  [facility] LAB;  Service: Cardiovascular;  Laterality: Right;  right superficial femoral   PERIPHERAL VASCULAR BALLOON ANGIOPLASTY Right 04/22/2018    Procedure: PERIPHERAL VASCULAR BALLOON ANGIOPLASTY;  Surgeon: Serene Gaile ORN, MD;  [facility] LAB;  Service: Cardiovascular;  Laterality: Right;  external iliac   PERIPHERAL VASCULAR BALLOON ANGIOPLASTY Left 03/08/2020    Procedure: PERIPHERAL VASCULAR BALLOON  ANGIOPLASTY;  Surgeon: Serene Gaile ORN, MD;  [facility];  Service: Cardiovascular;  Laterality: Left;  Common Femoral   PERIPHERAL VASCULAR BALLOON ANGIOPLASTY Left 08/23/2020    Procedure: PERIPHERAL VASCULAR BALLOON ANGIOPLASTY;  Surgeon: Serene Gaile ORN, MD;  [facility] LAB;  Service: Cardiovascular;  Laterality: Left;  common femoral   PERIPHERAL VASCULAR BALLOON ANGIOPLASTY Left 10/31/2021    Procedure: PERIPHERAL VASCULAR BALLOON ANGIOPLASTY;  Surgeon: Serene Gaile ORN, MD;  [facility] INVAS[facility] CV  LAB;  Service: Cardiovascular;  Laterality: Left;  external iliac   PERIPHERAL VASCULAR INTERVENTION Right 03/08/2020    Procedure: PERIPHERAL VASCULAR INTERVENTION;  Surgeon: Serene Gaile ORN, MD;  [facility] LAB;  Service: Cardiovascular;  Laterality: Right;  SFA   PERIPHERAL VASCULAR INTERVENTION Right 08/23/2020    Procedure: PERIPHERAL VASCULAR INTERVENTION;  Surgeon: Serene Gaile ORN, MD;  [facility] CV LAB;  Service: Cardiovascular;  Laterality: Right;  external iliac   PERIPHERAL VASCULAR INTERVENTION Right 10/31/2021    Procedure: PERIPHERAL VASCULAR INTERVENTION;  Surgeon: Serene Gaile ORN, MD;  [facility] CV LAB;  Service: Cardiovascular;  Laterality: Right;  superficial femoral artery   PERIPHERAL VASCULAR INTERVENTION Left 12/19/2021    Procedure: PERIPHERAL VASCULAR INTERVENTION;  Surgeon: Serene Gaile ORN, MD;  [facility]VAS[facility] CV LAB;  Service: Cardiovascular;  Laterality: Left;  stent lt ext iliac / pta common femoral   PERIPHERAL VASCULAR INTERVENTION   02/19/2023    Procedure: PERIPHERAL VASCULAR INTERVENTION;  Surgeon: Serene Gaile ORN, MD;  [facility]VAS[facility] CV LAB;  Service: Cardiovascular;;   RADIOLOGY WITH ANESTHESIA [name] 07/01/2019    Procedure: STENTING;  Surgeon: Dolphus Carrion, MD;  [facility] OR;  Service: Radiology;  Laterality: [name];   RADIOLOGY WITH ANESTHESIA [name] 07/22/2019    Procedure:  STENTING;  Surgeon: Dolphus Carrion, MD;  Location: [facility] OR;  Service: Radiology;  Laterality: [name];   TOTAL SHOULDER ARTHROPLASTY Right 2011    Dr. Dozier COWBOY NERVE TRANSPOSITION Left 02/02/2016    Procedure: LEFT IN-SITU DECOMPRESSION ULNAR NERVE ;  Surgeon: Arley Curia, MD;  Location: [facility];  Service: Orthopedics;  Laterality: Left;   ULNAR TUNNEL RELEASE Left 02/02/2016    Procedure: LEFT CUBITAL TUNNEL RELEASE;  Surgeon: Arley Curia, MD;  Location:  SURGERY CENTER;  Service: Orthopedics;  Laterality: Left;   VASECTOMY   1971   VEIN HARVEST Left 08/16/2017    Procedure: USING NON REVERSE LEFT GREATER SAPHENOUS VEIN HARVEST;  Surgeon: Serene Gaile ORN, MD;  [facility] OR;  Service: Vascular;  Laterality: Left;   VIDEO BRONCHOSCOPY WITH ENDOBRONCHIAL NAVIGATION [name] 07/30/2024    Procedure: VIDEO BRONCHOSCOPY WITH ENDOBRONCHIAL NAVIGATION;  Surgeon: Kerrin Elspeth BROCKS, MD;  [facility] ENDOSCOPY;  Service: Thoracic;  Laterality: [name];   VIDEO BRONCHOSCOPY WITH ENDOBRONCHIAL ULTRASOUND [name] 07/30/2024    Procedure: BRONCHOSCOPY, WITH EBUS;  Surgeon: Kerrin Elspeth BROCKS, MD;  Location: [facility] ENDOSCOPY;  Service: Thoracic;  Laterality: [name];  ROBOTIC BRONCHOSCOPY WITH EBUS          Home Medications:  Allergies as of 09/02/2024   No Known Allergies         Medication List           Accurate as of September 02, 2024  9:13 AM. If you have any questions, ask your nurse or doctor.              aspirin  EC 81 MG tablet Take 81 mg by mouth every evening.    atorvastatin  20 MG tablet Commonly known as: LIPITOR  Take 20 mg by mouth every evening.    Brilinta  90 MG Tabs tablet Generic drug: ticagrelor  Take 90 mg by mouth 2 (two) times daily.    carvedilol  6.25 MG tablet Commonly known as: COREG  TAKE 1 TABLET TWICE A DAY WITH MEALS (DOSE CHANGE, NEW PRESCRIPTION)    CENTRUM SILVER PO Take 1 tablet by mouth daily. 50+    cetirizine 10 MG tablet Commonly  known as:  ZYRTEC Take 10 mg by mouth daily.    CLEAR EYES OP Place 1 drop into both eyes daily as needed (dry eyes).    clotrimazole -betamethasone  cream Commonly known as: LOTRISONE  Apply 1 Application topically 2 (two) times daily.    dextromethorphan -guaiFENesin  30-600 MG 12hr tablet Commonly known as: MUCINEX  DM Take 1 tablet by mouth 2 (two) times daily.    Fish Oil  1000 MG Caps Take 1,000 mg by mouth every evening. What changed: when to take this    gabapentin  300 MG capsule Commonly known as: NEURONTIN  Take 600 mg by mouth 2 (two) times daily.    [place] Psoriasis Relief 3 % Crea Generic drug: Salicylic Acid Apply 1 application topically daily as needed (psoriasis).    HYDROcodone -acetaminophen  5-325 MG tablet Commonly known as: NORCO/VICODIN Take 1-2 tablets by mouth See admin instructions. Take 2 tablets in the morning and evening, may take 1 tablet at lunch as needed for pain    HYDROcodone -acetaminophen  10-325 MG tablet Commonly known as: NORCO Take 1 tablet by mouth at bedtime.    Magnesium  250 MG Tabs Take 250 mg by mouth every evening.    methocarbamol  500 MG tablet Commonly known as: ROBAXIN  Take 1 tablet (500 mg total) by mouth every 8 (eight) hours as needed for muscle spasms.    methocarbamol  500 MG tablet Commonly known as: ROBAXIN  Take 1 tablet (500 mg total) by mouth 4 (four) times daily.    Neuriva Plus Caps Take 1 capsule by mouth daily.    sacubitril -valsartan  97-103 MG Commonly known as: Entresto  Take 1 tablet by mouth 2 (two) times daily.    silodosin 8 MG Caps capsule Commonly known as: RAPAFLO Take 8 mg by mouth daily with breakfast.    SUPER B COMPLEX/C PO Take 1 tablet by mouth every evening.    tamsulosin  0.4 MG Caps capsule Commonly known as: FLOMAX  Take 0.4 mg by mouth daily after supper.    traMADol  50 MG tablet Commonly known as: ULTRAM  Take 50 mg by mouth 2 (two) times daily as needed for moderate pain (pain score  4-6).             Allergies:  Allergies  No Known Allergies     Family History:      Family History  Problem Relation Age of Onset   Coronary artery disease Mother     Diabetes Mother     Heart disease Mother          Before age 67 - 5 Bypasses   Hypertension Mother     Heart attack Mother          3-4 Heart attacks   Alzheimer's disease Father     Diabetes Father     Heart attack Sister     Cancer Brother          Prostate   Hyperlipidemia Son     Prostate cancer Brother     Alzheimer's disease Sister     Colon cancer Neg Hx            Social History:  reports that he has been smoking cigarettes. He started smoking about 67 years ago. He has a 67 pack-year smoking history. He has been exposed to tobacco smoke. He has never used smokeless tobacco. He reports that he does not drink alcohol and does not use drugs.   ROS: All other review of systems were reviewed and are negative except what is noted above in HPI   Physical Exam:  BP (!) 166/71   Pulse 67   Constitutional:  Alert and oriented, No acute distress. HEENT: [place] AT, moist mucus membranes.  Trachea midline, no masses. Cardiovascular: No clubbing, cyanosis, or edema. Respiratory: Normal respiratory effort, no increased work of breathing. GI: Abdomen is soft, nontender, nondistended, no abdominal masses GU: No CVA tenderness.  Lymph: No cervical or inguinal lymphadenopathy. Skin: No rashes, bruises or suspicious lesions. Neurologic: Grossly intact, no focal deficits, moving all 4 extremities. Psychiatric: Normal mood and affect.   Laboratory Data: Recent Labs       Lab Results  Component Value Date    WBC 13.7 (H) 08/21/2024    HGB 13.1 08/21/2024    HCT 38.4 (L) 08/21/2024    MCV 90.4 08/21/2024    PLT 236 08/21/2024        Recent Labs       Lab Results  Component Value Date    CREATININE 0.73 08/21/2024        Recent Labs       Lab Results  Component Value Date    PSA 1.37 01/08/2012     PSA 1.02 Test Methodology: Hybr[employer] PSA 10/06/2008        Recent Labs  No results found for: TESTOSTERONE     Recent Labs        Lab Results  Component Value Date    HGBA1C (H) 10/18/2010      5.9 (NOTE)                                                                       According to the ADA Clinical Practice Recommendations for 2011, when HbA1c is used as a screening test:   >=6.5%   Diagnostic of Diabetes Mellitus           (if abnormal result  is confirmed)  5.7-6.4%   Increased risk of developing Diabetes Mellitus  References:Diagnosis and Classification of Diabetes Mellitus,Diabetes Care,2011,34(Suppl 1):S62-S69 and Standards of Medical Care in         Diabetes - 2011,Diabetes Care,2011,34  (Suppl 1):S11-S61.        Urinalysis Labs (Brief)          Component Value Date/Time    COLORURINE YELLOW 08/12/2024 1408    APPEARANCEUR CLEAR 08/12/2024 1408    APPEARANCEUR Clear 07/20/2024 0943    LABSPEC 1.009 08/12/2024 1408    PHURINE 6.0 08/12/2024 1408    GLUCOSEU NEGATIVE 08/12/2024 1408    HGBUR NEGATIVE 08/12/2024 1408    BILIRUBINUR NEGATIVE 08/12/2024 1408    BILIRUBINUR Negative 07/20/2024 0943    KETONESUR NEGATIVE 08/12/2024 1408    PROTEINUR NEGATIVE 08/12/2024 1408    UROBILINOGEN 0.2 06/10/2014 0830    NITRITE NEGATIVE 08/12/2024 1408    LEUKOCYTESUR NEGATIVE 08/12/2024 1408        Recent Labs       Lab Results  Component Value Date    LABMICR Comment 07/20/2024    BACTERIA NONE SEEN 07/22/2019        Pertinent Imaging:   Results for orders placed during the hospital encounter of 03/18/14   DG Abd 1 View - KUB   Narrative CLINICAL DATA:  Agile capsule follow up.   EXAM: ABDOMEN - 1 VIEW  COMPARISON:  Abdominal CTA 02/17/2014.   FINDINGS: The bowel gas pattern is normal. Rectangular radiodensity in the left upper quadrant is just below the hemidiaphragm and is likely within the splenic flexure of the colon, although could be in  the gastric fundus. Lower lumbar spondylosis is noted status post L4-5 fusion. Aortoiliac atherosclerosis is noted.   IMPRESSION: Capsule is in the left subphrenic region.     Electronically Signed By: Zell Perone M.D. On: 03/19/2014 09:36   No results found for this or any previous visit.   No results found for this or any previous visit.   No results found for this or any previous visit.   Results for orders placed during the hospital encounter of 12/04/21   US  RENAL   Narrative CLINICAL DATA:  Flank pain   Difficulty urinating   Cirrhosis   CHF   Hypertension   EXAM: RENAL / URINARY TRACT ULTRASOUND COMPLETE   COMPARISON:  1122   FINDINGS: Right Kidney:   Renal measurements: 12.2 x 4.3 x 4.9 cm = volume: 132 mL. Echogenicity within normal limits. No mass or hydronephrosis visualized.   Left Kidney:   Renal measurements: 11.4 x 4.8 x 5.0 cm = volume: 143 mL. Echogenicity within normal limits. No mass or hydronephrosis visualized.   Bladder:   Small amount of layering debris noted within the bladder. Bladder otherwise normal appearance.   Other:   None.   IMPRESSION: 1. No significant sonographic abnormality of the kidneys. 2. Small amount of layering debris noted within the bladder. Please correlate for possibility of cystitis.     Electronically Signed By: Aliene Lloyd M.D. On: 12/04/2021 09:20   No results found for this or any previous visit.   No results found for this or any previous visit.   No results found for this or any previous visit.     Assessment & Plan:     1. Phimosis (Primary) The risks/benefits/alterantives to circumcision was explained to the patient and he understands and wishes to proceed with surgery

## 2024-10-26 NOTE — Transfer of Care (Signed)
 Immediate Anesthesia Transfer of Care Note  Patient: Roy [name]  Procedure(s) Performed: CIRCUMCISION, ADULT (Penis)  Patient Location: PACU  Anesthesia Type:General  Level of Consciousness: awake, oriented, drowsy, and patient cooperative  Airway & Oxygen  Therapy: Patient Spontanous Breathing  Post-op Assessment: Report given to RN and Post -op Vital signs reviewed and stable  Post vital signs: stable  Last Vitals:  Vitals Value Taken Time  BP 154/56 10/26/24 08:40  Temp 36.4 C 10/26/24 08:40  Pulse 59 10/26/24 08:42  Resp 15 10/26/24 08:42  SpO2 92 % 10/26/24 08:42  Vitals shown include unfiled device data.  Last Pain:  Vitals:   10/26/24 0703  PainSc: 0-No pain         Complications: No notable events documented.

## 2024-10-26 NOTE — Anesthesia Preprocedure Evaluation (Addendum)
 Anesthesia Evaluation  Patient identified by MRN, date of birth, ID band Patient awake    Reviewed: Allergy & Precautions, H&P , NPO status , Patient's Chart, lab work & pertinent test results  Airway Mallampati: II  TM Distance: >3 FB Neck ROM: Full    Dental  (+) Edentulous Upper, Edentulous Lower   Pulmonary COPD, former smoker Hx lung ca   Pulmonary exam normal breath sounds clear to auscultation       Cardiovascular hypertension, + CAD, + Past MI, + Peripheral Vascular Disease and +CHF  Normal cardiovascular exam+ dysrhythmias + pacemaker + Cardiac Defibrillator  Rhythm:Regular Rate:Normal  EF 8/25 60-65%   Neuro/Psych  PSYCHIATRIC DISORDERS Anxiety Depression     Neuromuscular disease    GI/Hepatic negative GI ROS,,,(+)     substance abuse  alcohol useHx etoh abuse   Endo/Other  negative endocrine ROS    Renal/GU Renal disease  negative genitourinary   Musculoskeletal  (+) Arthritis ,    Abdominal   Peds negative pediatric ROS (+)  Hematology  (+) Blood dyscrasia, anemia   Anesthesia Other Findings   Reproductive/Obstetrics negative OB ROS                              Anesthesia Physical Anesthesia Plan  ASA: 3  Anesthesia Plan: General   Post-op Pain Management:    Induction: Intravenous  PONV Risk Score and Plan:   Airway Management Planned: LMA  Additional Equipment:   Intra-op Plan:   Post-operative Plan: Extubation in OR  Informed Consent: I have reviewed the patients History and Physical, chart, labs and discussed the procedure including the risks, benefits and alternatives for the proposed anesthesia with the patient or authorized representative who has indicated his/her understanding and acceptance.       Plan Discussed with: CRNA  Anesthesia Plan Comments:         Anesthesia Quick Evaluation

## 2024-10-26 NOTE — Anesthesia Postprocedure Evaluation (Signed)
 Anesthesia Post Note  Patient: Roy [name]  Procedure(s) Performed: CIRCUMCISION, ADULT (Penis)  Patient location during evaluation: PACU Anesthesia Type: General Level of consciousness: awake and alert Pain management: pain level controlled Vital Signs Assessment: post-procedure vital signs reviewed and stable Respiratory status: spontaneous breathing, nonlabored ventilation, respiratory function stable and patient connected to nasal cannula oxygen  Cardiovascular status: blood pressure returned to baseline and stable Postop Assessment: no apparent nausea or vomiting Anesthetic complications: no   No notable events documented.   Last Vitals:  Vitals:   10/26/24 0858 10/26/24 0900  BP:    Pulse: 62 60  Resp: 15 18  Temp:    SpO2: 95% 93%    Last Pain:  Vitals:   10/26/24 0858  PainSc: 0-No pain                 Andrea Limes

## 2024-10-26 NOTE — Anesthesia Procedure Notes (Signed)
 Procedure Name: LMA Insertion Date/Time: 10/26/2024 7:49 AM  Performed by: Toribio Darice BRAVO, CRNAPre-anesthesia Checklist: Patient identified, Emergency Drugs available, Suction available, Patient being monitored and Timeout performed Patient Re-evaluated:Patient Re-evaluated prior to induction Oxygen  Delivery Method: Circle system utilized Preoxygenation: Pre-oxygenation with 100% oxygen  Induction Type: IV induction Ventilation: Mask ventilation with difficulty LMA: LMA inserted LMA Size: 4.0 Number of attempts: 1 Placement Confirmation: positive ETCO2 and CO2 detector Tube secured with: Tape Dental Injury: Teeth and Oropharynx as per pre-operative assessment

## 2024-10-26 NOTE — Op Note (Signed)
 Preoperative diagnosis: phimosis  Postoperative diagnosis: Same  Procedure: circumcision  Attending: Belvie Clara, MD  Anesthesia: general  History of blood loss: Minimal  Antibiotics: ancef   Drains: none  Specimens: foreskin  Findings: dense phimotic ring. No masses/lesions on glans or foreskin.  Indications: Patient is a 78 year old male with a history of phimosis, difficulty urinating, and recurrent balanoprothitis  After discussing treatment options he decided to proceed with circumcision   Procedure in detail: Prior to procedure consent was obtained.  Patient was brought to the operating room and a brief timeout was done to ensure correct patient, correct procedure, correct site.  General anesthesia was administered and patient was placed in supine position.  His genitalia and abdomen was then prepped and draped in usual sterile fashion.  The phimotic ring was incised sharply and the foreskin was then retracted. An circumferential incision was made 1.5cm below the coronal ridge. We then pulled the foreskin over the glans and made a separate circumferential incision at the level of the coronal ridge. We then sharply incised the foreskin and then freed it from the dartos using electrocautery. We then sent the foreskin for pathology. Individual bleeders were then cauterized and good hemostasis was noted. We then reapproximated the penile skin to the subcoronal incision. We used interrupted 3-0 vicryl to close the incision. Once this was complete we applied a gauze bandage and this then concluded the procedure which was well tolerated by the patient.  Complications: None  Condition: Stable, extubated, transferred to PACU.  Plan: Patient is to be discharged home.  He is to followup in 2 weeks for a wound check

## 2024-10-27 ENCOUNTER — Encounter (HOSPITAL_COMMUNITY): Payer: Self-pay | Admitting: Urology

## 2024-10-27 LAB — SURGICAL PATHOLOGY

## 2024-10-28 ENCOUNTER — Telehealth: Payer: Self-pay

## 2024-10-28 NOTE — Telephone Encounter (Signed)
 Return call to pt. Pt want to know when can be shower after his circumcision. Verbal from [facility] pt can shower 2 days after. Pt made aware and voiced understanding

## 2024-11-03 ENCOUNTER — Encounter

## 2024-11-03 ENCOUNTER — Other Ambulatory Visit

## 2024-11-10 ENCOUNTER — Ambulatory Visit: Payer: Medicare Other

## 2024-11-10 DIAGNOSIS — I255 Ischemic cardiomyopathy: Secondary | ICD-10-CM | POA: Diagnosis not present

## 2024-11-11 ENCOUNTER — Encounter: Payer: Self-pay | Admitting: Urology

## 2024-11-11 ENCOUNTER — Ambulatory Visit: Admitting: Urology

## 2024-11-11 VITALS — BP 151/62 | HR 66

## 2024-11-11 DIAGNOSIS — N471 Phimosis: Secondary | ICD-10-CM

## 2024-11-11 DIAGNOSIS — R3912 Poor urinary stream: Secondary | ICD-10-CM

## 2024-11-11 DIAGNOSIS — N401 Enlarged prostate with lower urinary tract symptoms: Secondary | ICD-10-CM

## 2024-11-11 LAB — CUP PACEART REMOTE DEVICE CHECK
Battery Remaining Longevity: 73 mo
Battery Remaining Percentage: 85 %
Battery Voltage: 2.99 V
Brady Statistic AP VP Percent: 38 %
Brady Statistic AP VS Percent: 1 %
Brady Statistic AS VP Percent: 61 %
Brady Statistic AS VS Percent: 1 %
Brady Statistic RA Percent Paced: 38 %
Date Time Interrogation Session: 20251216020254
HighPow Impedance: 69 Ohm
Implantable Lead Connection Status: 753985
Implantable Lead Connection Status: 753985
Implantable Lead Connection Status: 753985
Implantable Lead Implant Date: 20160923
Implantable Lead Implant Date: 20160923
Implantable Lead Implant Date: 20160923
Implantable Lead Location: 753858
Implantable Lead Location: 753859
Implantable Lead Location: 753860
Implantable Lead Model: 7122
Implantable Pulse Generator Implant Date: 20241216
Lead Channel Impedance Value: 440 Ohm
Lead Channel Impedance Value: 450 Ohm
Lead Channel Impedance Value: 890 Ohm
Lead Channel Pacing Threshold Amplitude: 0.75 V
Lead Channel Pacing Threshold Amplitude: 1 V
Lead Channel Pacing Threshold Amplitude: 1 V
Lead Channel Pacing Threshold Pulse Width: 0.5 ms
Lead Channel Pacing Threshold Pulse Width: 0.5 ms
Lead Channel Pacing Threshold Pulse Width: 0.8 ms
Lead Channel Sensing Intrinsic Amplitude: 12 mV
Lead Channel Sensing Intrinsic Amplitude: 3.9 mV
Lead Channel Setting Pacing Amplitude: 2 V
Lead Channel Setting Pacing Amplitude: 2.5 V
Lead Channel Setting Pacing Amplitude: 2.5 V
Lead Channel Setting Pacing Pulse Width: 0.5 ms
Lead Channel Setting Pacing Pulse Width: 0.8 ms
Lead Channel Setting Sensing Sensitivity: 0.6 mV
Pulse Gen Serial Number: 111071805
Zone Setting Status: 755011

## 2024-11-11 MED ORDER — FINASTERIDE 5 MG PO TABS: 5.0000 mg | ORAL_TABLET | Freq: Every day | ORAL | 3 refills | Status: AC

## 2024-11-11 NOTE — Progress Notes (Signed)
 11/11/2024 9:01 AM   Roy [name] November 20, 1946 [id number]  Referring provider: Shona Norleen PEDLAR, MD [address] [place],  [place] [address]  Followup after circumcision   HPI: Roy [name] is a 78yo here for followup after circumcision. No pain from incision. No drainage from incision. No erythema or induration.  He has a longstadning history of difficulty urinating. He is currently on rapalf 8mg  daily. He has straining to urinate. Noctuia 1-2x. IPSS 18 QOL 3 on rapalflo 8mg    PMH: Past Medical History:  Diagnosis Date   AICD (automatic cardioverter/defibrillator) present 08/19/2015   St Jude BiV ICD for primary prevention by Dr. Waddell   Alcohol abuse    6 beers per day; hospital admission in 2009 for withdrawal symptoms   Anxiety and depression    denies    Cerebrovascular disease 2009   TIA; 2009- right ICA stent; re-intervention for restenosis complicated by [facility] w/o sx   CHF (congestive heart failure) (HCC) 06/02/2014   Chronic obstructive pulmonary disease (HCC)    Degenerative joint disease    Total shoulder arthroplasty-right   Hyperlipidemia    Hypertension    LBBB (left bundle branch block)    Normal echo-2011; stress nuclear in 09/2010--septal hypoperfusion representing nontransmural infarction or the effect of left bundle branch block, no ischemia   Lung cancer (HCC)    Myocardial infarction (HCC) 06/02/2014   Massive Heart Attack   Peripheral vascular disease    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   Thrombocytopenia    Tobacco abuse    -100 pack years; 1.5 packs per day   Traumatic seroma of thigh    left   Tubular adenoma of colon     Surgical History: Past Surgical History:  Procedure Laterality Date   ABDOMINAL AORTAGRAM [name] 05/04/2014   Procedure: ABDOMINAL EZELLA;  Surgeon: Gaile LELON New, MD;  Location: [facility] CATH LAB;  Service: Cardiovascular;  Laterality: [name];   ABDOMINAL AORTOGRAM [name] 06/25/2017   Procedure: Abdominal Aortogram;   Surgeon: New Gaile LELON, MD;  [facility] INVASIVE CV LAB;  Service: Cardiovascular;  Laterality: [name];   ABDOMINAL AORTOGRAM W/LOWER EXTREMITY [name] 04/22/2018   Procedure: ABDOMINAL AORTOGRAM W/LOWER EXTREMITY;  Surgeon: New Gaile LELON, MD;  [facility];  Service: Cardiovascular;  Laterality: [name];   ABDOMINAL AORTOGRAM W/LOWER EXTREMITY [name] 03/08/2020   Procedure: ABDOMINAL AORTOGRAM W/LOWER EXTREMITY;  Surgeon: New Gaile LELON, MD;  [facility];  Service: Cardiovascular;  Laterality: [name];   ABDOMINAL AORTOGRAM W/LOWER EXTREMITY Bilateral 08/23/2020   Procedure: ABDOMINAL AORTOGRAM W/LOWER EXTREMITY;  Surgeon: New Gaile LELON, MD;  [facility];  Service: Cardiovascular;  Laterality: Bilateral;   ABDOMINAL AORTOGRAM W/LOWER EXTREMITY [name] 10/31/2021   Procedure: ABDOMINAL AORTOGRAM W/LOWER EXTREMITY;  Surgeon: New Gaile LELON, MD;  [facility];  Service: Cardiovascular;  Laterality: [name];   ABDOMINAL AORTOGRAM W/LOWER EXTREMITY [name] 12/19/2021   Procedure: ABDOMINAL AORTOGRAM W/LOWER EXTREMITY;  Surgeon: New Gaile LELON, MD;  [facility];  Service: Cardiovascular;  Laterality: [name];   ABDOMINAL AORTOGRAM W/LOWER EXTREMITY Bilateral 02/19/2023   Procedure: ABDOMINAL AORTOGRAM W/LOWER EXTREMITY;  Surgeon: New Gaile LELON, MD;  [facility];  Service: Cardiovascular;  Laterality: Bilateral;   AGILE CAPSULE [name] 03/18/2014   Procedure: AGILE CAPSULE;  Surgeon: Margo LITTIE Haddock, MD;  [facility];  Service: Endoscopy;  Laterality: [name];  7:30   APPLICATION OF WOUND VAC Left 09/03/2017  Procedure: APPLICATION OF WOUND VAC;  Surgeon: Laurence Redell CROME, MD;  Location: [facility] OR;  Service: Vascular;  Laterality: Left;   APPLICATION OF WOUND VAC Right 04/07/2020   Procedure: PLACEMENT OF ANTIBIOTIC BEADS AND APPLICATION OF WOUND VAC;  Surgeon: Serene Gaile ORN, MD;  [facility];  Service: Vascular;   Laterality: Right;   BACK SURGERY     BACTERIAL OVERGROWTH TEST [name] 05/24/2015   Procedure: BACTERIAL OVERGROWTH TEST;  Surgeon: Margo CROME Haddock, MD;  [facility];  Service: Endoscopy;  Laterality: [name];  0700   BI-VENTRICULAR IMPLANTABLE CARDIOVERTER DEFIBRILLATOR  (CRT-D)  08/19/2015   BIV ICD GENERATOR CHANGEOUT [name] 11/11/2023   Procedure: BIV ICD GENERATOR CHANGEOUT;  Surgeon: Waddell Danelle ORN, MD;  Location: [facility] INVASIVE CV LAB;  Service: Cardiovascular;  Laterality: [name];   BRONCHIAL BIOPSY  07/30/2024   Procedure: BRONCHOSCOPY, WITH BIOPSY;  Surgeon: Kerrin Elspeth BROCKS, MD;  Location: [facility] ENDOSCOPY;  Service: Thoracic;;   BRONCHIAL NEEDLE ASPIRATION BIOPSY  07/30/2024   Procedure: BRONCHOSCOPY, WITH NEEDLE ASPIRATION BIOPSY;  Surgeon: Kerrin Elspeth BROCKS, MD;  Location: [facility] ENDOSCOPY;  Service: Thoracic;;   CARPAL TUNNEL RELEASE Left 02/02/2016   Procedure: LEFT CARPAL TUNNEL RELEASE;  Surgeon: Arley Curia, MD;  Location: [facility];  Service: Orthopedics;  Laterality: Left;  ANESTHESIA: IV REGIONAL UPPER ARM   CATARACT EXTRACTION W/PHACO Right 03/08/2015   Procedure: CATARACT EXTRACTION PHACO AND INTRAOCULAR LENS PLACEMENT (IOC);  Surgeon: Oneil Platts, MD;  Location: [facility] ORS;  Service: Ophthalmology;  Laterality: Right;  CDE:9.46   CATARACT EXTRACTION W/PHACO Left 03/22/2015   Procedure: CATARACT EXTRACTION PHACO AND INTRAOCULAR LENS PLACEMENT (IOC);  Surgeon: Oneil Platts, MD;  Location: [facility] ORS;  Service: Ophthalmology;  Laterality: Left;  CDE:5.80   CIRCUMCISION [name] 10/26/2024   Procedure: CIRCUMCISION, ADULT;  Surgeon: Sherrilee Belvie CROME, MD;  Location: [facility] ORS;  Service: Urology;  Laterality: [name];   COLONOSCOPY  08/22/09   Fields-(Tubular Adenoma)3-mm transverse polyp/4-mm polyp otherwise noraml/small internal hemorrhoids   COLONOSCOPY [name] 04/14/2014   DOQ:dfjoo internal hemorrhids/normal mocsa in the terminal iluem/left colonis redundant   COLONOSCOPY [name] 07/01/2024    Procedure: COLONOSCOPY;  Surgeon: Shaaron Lamar HERO, MD;  [facility];  Service: Endoscopy;  Laterality: [name];  900am, asa 3   COLONOSCOPY W/ POLYPECTOMY  2011   ENDARTERECTOMY FEMORAL Left 08/16/2017   Procedure: ENDARTERECTOMY LEFT PROFUNDA FEMORAL;  Surgeon: Serene Gaile ORN, MD;  Location: [facility] OR;  Service: Vascular;  Laterality: Left;   ENDARTERECTOMY FEMORAL Right 03/31/2020   Procedure: RIGHT FEMORAL ENDARTERECTOMY;  Surgeon: Serene Gaile ORN, MD;  [facility];  Service: Vascular;  Laterality: Right;   EP IMPLANTABLE DEVICE [name] 08/19/2015   Procedure: BiV ICD Insertion CRT-D;  Surgeon: Danelle ORN Waddell, MD;  Location: [facility] INVASIVE CV LAB;  Service: Cardiovascular;  Laterality: [name];   ESOPHAGOGASTRODUODENOSCOPY [name] 03/05/2014   SLF: 1. Stricture at the gastroesophagael junction 2. Small hiatal hernia 3. Moderate non-erosive gastritis and duodentitis. 4. No surce for Melena identified.    FEMORAL-POPLITEAL BYPASS GRAFT Left 08/16/2017   Procedure: LEFT  FEMORAL-POPLITEAL ARTERY  BYPASS GRAFT;  Surgeon: Serene Gaile ORN, MD;  [facility];  Service: Vascular;  Laterality: Left;   GIVENS CAPSULE STUDY [name] 03/30/2014   Procedure: GIVENS CAPSULE STUDY;  Surgeon: Margo CROME Haddock, MD;  [facility];  Service: Endoscopy;  Laterality: [name];  7:30   GROIN DEBRIDEMENT Right 04/07/2020   Procedure: INCISION AND DRAINAGE OF RIGHT GROIN;  Surgeon: Serene,  Gaile ORN, MD;  [facility];  Service: Vascular;  Laterality: Right;   I & D EXTREMITY Left 09/03/2017   Procedure: IRRIGATION AND DEBRIDEMENT EXTREMITY LEFT THIGH SEROMA;  Surgeon: Laurence Redell CROME, MD;  [facility];  Service: Vascular;  Laterality: Left;   INSERT / REPLACE / REMOVE PACEMAKER     INSERTION OF ILIAC STENT  03/31/2020   Procedure: INSERTION OF RIGHT ILIAC ARTERY STENT AND RIGHT SUPERFICIAL FEMORAL ARTERY STENT;  Surgeon: Serene Gaile ORN, MD;  [facility];  Service: Vascular;;   INTERCOSTAL NERVE BLOCK Right 08/19/2024    Procedure: BLOCK, NERVE, INTERCOSTAL;  Surgeon: Kerrin Elspeth BROCKS, MD;  [facility];  Service: Thoracic;  Laterality: Right;   IR ANGIO INTRA EXTRACRAN SEL COM CAROTID INNOMINATE BILAT MOD SED  07/16/2017   IR ANGIO INTRA EXTRACRAN SEL COM CAROTID INNOMINATE BILAT MOD SED  06/12/2019   IR ANGIO INTRA EXTRACRAN SEL COM CAROTID INNOMINATE BILAT MOD SED  01/17/2021   IR ANGIO VERTEBRAL SEL SUBCLAVIAN INNOMINATE BILAT MOD SED  07/16/2017   IR ANGIO VERTEBRAL SEL SUBCLAVIAN INNOMINATE BILAT MOD SED  06/12/2019   IR ANGIO VERTEBRAL SEL SUBCLAVIAN INNOMINATE BILAT MOD SED  01/17/2021   IR RADIOLOGIST EVAL & MGMT  04/01/2017   IR RADIOLOGIST EVAL & MGMT  08/02/2017   IR TRANSCATH EXCRAN VERT OR CAR A STENT  07/22/2019   IR US  GUIDE VASC ACCESS RIGHT  06/12/2019   IR US  GUIDE VASC ACCESS RIGHT  01/17/2021   JOINT REPLACEMENT Right    Total Shoulder Replacement    LOBECTOMY, LUNG, ROBOT-ASSISTED, USING VATS Right 08/19/2024   Procedure: RIGHT LOWER LOBECTOMY, LUNG, ROBOT-ASSISTED, USING VATS;  Surgeon: Kerrin Elspeth BROCKS, MD;  [facility];  Service: Thoracic;  Laterality: Right;  ROBOTIC RIGHT LOWER LOBECTOMY   LOWER EXTREMITY ANGIOGRAPHY Bilateral 06/25/2017   Procedure: Lower Extremity Angiography;  Surgeon: Serene Gaile ORN, MD;  [facility];  Service: Cardiovascular;  Laterality: Bilateral;   LUMBAR FUSION  2010   LYMPH NODE BIOPSY Right 08/19/2024   Procedure: LYMPH NODE BIOPSY;  Surgeon: Kerrin Elspeth BROCKS, MD;  Location: [facility] OR;  Service: Thoracic;  Laterality: Right;   PATCH ANGIOPLASTY Right 03/31/2020   Procedure: PATCH ANGIOPLASTY USING XENOSURE BIOLOGIC PATCH 1cm x 14cm;  Surgeon: Serene Gaile ORN, MD;  Location: [facility] OR;  Service: Vascular;  Laterality: Right;   PERIPHERAL VASCULAR ATHERECTOMY Right 04/22/2018   Procedure: PERIPHERAL VASCULAR ATHERECTOMY;  Surgeon: Serene Gaile ORN, MD;  [facility];  Service: Cardiovascular;  Laterality: Right;  right  superficial femoral   PERIPHERAL VASCULAR BALLOON ANGIOPLASTY Right 04/22/2018   Procedure: PERIPHERAL VASCULAR BALLOON ANGIOPLASTY;  Surgeon: Serene Gaile ORN, MD;  [facility];  Service: Cardiovascular;  Laterality: Right;  external iliac   PERIPHERAL VASCULAR BALLOON ANGIOPLASTY Left 03/08/2020   Procedure: PERIPHERAL VASCULAR BALLOON ANGIOPLASTY;  Surgeon: Serene Gaile ORN, MD;  [facility];  Service: Cardiovascular;  Laterality: Left;  Common Femoral   PERIPHERAL VASCULAR BALLOON ANGIOPLASTY Left 08/23/2020   Procedure: PERIPHERAL VASCULAR BALLOON ANGIOPLASTY;  Surgeon: Serene Gaile ORN, MD;  [facility];  Service: Cardiovascular;  Laterality: Left;  common femoral   PERIPHERAL VASCULAR BALLOON ANGIOPLASTY Left 10/31/2021   Procedure: PERIPHERAL VASCULAR BALLOON ANGIOPLASTY;  Surgeon: Serene Gaile ORN, MD;  [facility];  Service: Cardiovascular;  Laterality: Left;  external iliac   PERIPHERAL VASCULAR INTERVENTION Right 03/08/2020   Procedure: PERIPHERAL  VASCULAR INTERVENTION;  Surgeon: Serene Gaile ORN, MD;  [facility];  Service: Cardiovascular;  Laterality: Right;  SFA   PERIPHERAL VASCULAR INTERVENTION Right 08/23/2020   Procedure: PERIPHERAL VASCULAR INTERVENTION;  Surgeon: Serene Gaile ORN, MD;  [facility];  Service: Cardiovascular;  Laterality: Right;  external iliac   PERIPHERAL VASCULAR INTERVENTION Right 10/31/2021   Procedure: PERIPHERAL VASCULAR INTERVENTION;  Surgeon: Serene Gaile ORN, MD;  [facility];  Service: Cardiovascular;  Laterality: Right;  superficial femoral artery   PERIPHERAL VASCULAR INTERVENTION Left 12/19/2021   Procedure: PERIPHERAL VASCULAR INTERVENTION;  Surgeon: Serene Gaile ORN, MD;  [facility];  Service: Cardiovascular;  Laterality: Left;  stent lt ext iliac / pta common femoral   PERIPHERAL VASCULAR INTERVENTION  02/19/2023    Procedure: PERIPHERAL VASCULAR INTERVENTION;  Surgeon: Serene Gaile ORN, MD;  [facility];  Service: Cardiovascular;;   RADIOLOGY WITH ANESTHESIA [name] 07/01/2019   Procedure: STENTING;  Surgeon: Dolphus Carrion, MD;  [facility] OR;  Service: Radiology;  Laterality: [name];   RADIOLOGY WITH ANESTHESIA [name] 07/22/2019   Procedure: STENTING;  Surgeon: Dolphus Carrion, MD;  [facility];  Service: Radiology;  Laterality: [name];   TOTAL SHOULDER ARTHROPLASTY Right 2011   Dr. Dozier COWBOY NERVE TRANSPOSITION Left 02/02/2016   Procedure: LEFT IN-SITU DECOMPRESSION ULNAR NERVE ;  Surgeon: Arley Curia, MD;  Location: [facility];  Service: Orthopedics;  Laterality: Left;   ULNAR TUNNEL RELEASE Left 02/02/2016   Procedure: LEFT CUBITAL TUNNEL RELEASE;  Surgeon: Arley Curia, MD;  Location: [facility];  Service: Orthopedics;  Laterality: Left;   VASECTOMY  1971   VEIN HARVEST Left 08/16/2017   Procedure: USING NON REVERSE LEFT GREATER SAPHENOUS VEIN HARVEST;  Surgeon: Serene Gaile ORN, MD;  [facility];  Service: Vascular;  Laterality: Left;   VIDEO BRONCHOSCOPY WITH ENDOBRONCHIAL NAVIGATION [name] 07/30/2024   Procedure: VIDEO BRONCHOSCOPY WITH ENDOBRONCHIAL NAVIGATION;  Surgeon: Kerrin Elspeth BROCKS, MD;  [facility] ENDOS[facility];  Service: Thoracic;  Laterality: [name];   VIDEO BRONCHOSCOPY WITH ENDOBRONCHIAL ULTRASOUND [name] 07/30/2024   Procedure: BRONCHOSCOPY, WITH EBUS;  Surgeon: Kerrin Elspeth BROCKS, MD;  Location: [facility] ENDOSCOPY;  Service: Thoracic;  Laterality: [name];  ROBOTIC BRONCHOSCOPY WITH EBUS    Home Medications:  Allergies as of 11/11/2024   No Known Allergies      Medication List        Accurate as of November 11, 2024  9:01 AM. If you have any questions, ask your nurse or doctor.          albuterol  108 (90 Base) MCG/ACT inhaler Commonly known as: VENTOLIN  HFA Inhale 1 puff into the lungs every 6 (six) hours as needed for wheezing or  shortness of breath.   aspirin  EC 81 MG tablet Take 81 mg by mouth every evening.   atorvastatin  20 MG tablet Commonly known as: LIPITOR  Take 20 mg by mouth every evening.   bacitracin  ointment Apply to affected area daily   Brilinta  90 MG Tabs tablet Generic drug: ticagrelor  Take 90 mg by mouth 2 (two) times daily.   carvedilol  6.25 MG tablet Commonly known as: COREG  TAKE 1 TABLET TWICE A DAY WITH MEALS (DOSE CHANGE, NEW PRESCRIPTION)   CENTRUM SILVER PO Take 1 tablet by mouth daily. Men 50+   cetirizine 10 MG tablet Commonly known as: ZYRTEC Take 10 mg by mouth daily.   CLEAR EYES OP Place 1 drop into  both eyes daily as needed (dry eyes).   clotrimazole -betamethasone  cream Commonly known as: LOTRISONE  Apply 1 Application topically 2 (two) times daily. What changed:  when to take this reasons to take this   dextromethorphan -guaiFENesin  30-600 MG 12hr tablet Commonly known as: MUCINEX  DM Take 1 tablet by mouth 2 (two) times daily.   Fish Oil  1000 MG Caps Take 1,000 mg by mouth every evening. What changed: when to take this   gabapentin  300 MG capsule Commonly known as: NEURONTIN  Take 300 mg by mouth 2 (two) times daily.   Gold Bond Psoriasis Relief 3 % Crea Generic drug: Salicylic Acid Apply 1 application topically daily as needed (psoriasis).   HYDROcodone -acetaminophen  10-325 MG tablet Commonly known as: NORCO Take 1 tablet by mouth every 6 (six) hours as needed for moderate pain (pain score 4-6).   Icy Hot 7.5 % (Roll) Misc Generic drug: Menthol (Topical Analgesic) Apply 1 each topically daily as needed (pain).   Magnesium  250 MG Tabs Take 250 mg by mouth every evening.   methocarbamol  500 MG tablet Commonly known as: ROBAXIN  Take 1 tablet (500 mg total) by mouth every 8 (eight) hours as needed for muscle spasms.   Neuriva Plus Caps Take 1 capsule by mouth in the morning.   OXYGEN  Inhale 2 L into the lungs continuous.   sacubitril -valsartan   97-103 MG Commonly known as: Entresto  Take 1 tablet by mouth 2 (two) times daily.   silodosin 8 MG Caps capsule Commonly known as: RAPAFLO Take 8 mg by mouth daily with breakfast.   SUPER B COMPLEX PO Take 1 tablet by mouth daily.   tamsulosin  0.4 MG Caps capsule Commonly known as: FLOMAX  Take 0.4 mg by mouth daily after supper.   traMADol  50 MG tablet Commonly known as: ULTRAM  Take 50 mg by mouth 2 (two) times daily.        Allergies: Allergies[1]  Family History: Family History  Problem Relation Age of Onset   Coronary artery disease Mother    Diabetes Mother    Heart disease Mother        Before age 31 - 5 Bypasses   Hypertension Mother    Heart attack Mother        3-4 Heart attacks   Alzheimer's disease Father    Diabetes Father    Heart attack Sister    Cancer Brother        Prostate   Hyperlipidemia Son    Prostate cancer Brother    Alzheimer's disease Sister    Colon cancer Neg Hx     Social History:  reports that he has quit smoking. His smoking use included cigarettes. He started smoking about 67 years ago. He has a 67.2 pack-year smoking history. He has been exposed to tobacco smoke. He has never used smokeless tobacco. He reports that he does not drink alcohol and does not use drugs.  ROS: All other review of systems were reviewed and are negative except what is noted above in HPI  Physical Exam: BP (!) 151/62   Pulse 66   Constitutional:  Alert and oriented, No acute distress. HEENT: [place] AT, moist mucus membranes.  Trachea midline, no masses. Cardiovascular: No clubbing, cyanosis, or edema. Respiratory: Normal respiratory effort, no increased work of breathing. GI: Abdomen is soft, nontender, nondistended, no abdominal masses GU: No CVA tenderness.  Lymph: No cervical or inguinal lymphadenopathy. Skin: No rashes, bruises or suspicious lesions. Neurologic: Grossly intact, no focal deficits, moving all 4 extremities. Psychiatric: Normal mood  and affect.  Laboratory Data: Lab Results  Component Value Date   WBC 13.7 (H) 08/21/2024   HGB 13.1 08/21/2024   HCT 38.4 (L) 08/21/2024   MCV 90.4 08/21/2024   PLT 236 08/21/2024    Lab Results  Component Value Date   CREATININE 0.73 08/21/2024    Lab Results  Component Value Date   PSA 1.37 01/08/2012   PSA 1.02 Test Methodology: [employer] PSA 10/06/2008    No results found for: TESTOSTERONE  Lab Results  Component Value Date   HGBA1C (H) 10/18/2010    5.9 (NOTE)                                                                       According to the ADA Clinical Practice Recommendations for 2011, when HbA1c is used as a screening test:   >=6.5%   Diagnostic of Diabetes Mellitus           (if abnormal result  is confirmed)  5.7-6.4%   Increased risk of developing Diabetes Mellitus  References:Diagnosis and Classification of Diabetes Mellitus,Diabetes Care,2011,34(Suppl 1):S62-S69 and Standards of Medical Care in         Diabetes - 2011,Diabetes Care,2011,34  (Suppl 1):S11-S61.    Urinalysis    Component Value Date/Time   COLORURINE YELLOW 08/12/2024 1408   APPEARANCEUR CLEAR 08/12/2024 1408   APPEARANCEUR Clear 07/20/2024 0943   LABSPEC 1.009 08/12/2024 1408   PHURINE 6.0 08/12/2024 1408   GLUCOSEU NEGATIVE 08/12/2024 1408   HGBUR NEGATIVE 08/12/2024 1408   BILIRUBINUR NEGATIVE 08/12/2024 1408   BILIRUBINUR Negative 07/20/2024 0943   KETONESUR NEGATIVE 08/12/2024 1408   PROTEINUR NEGATIVE 08/12/2024 1408   UROBILINOGEN 0.2 06/10/2014 0830   NITRITE NEGATIVE 08/12/2024 1408   LEUKOCYTESUR NEGATIVE 08/12/2024 1408    Lab Results  Component Value Date   LABMICR Comment 07/20/2024   BACTERIA NONE SEEN 07/22/2019    Pertinent Imaging:  Results for orders placed during the hospital encounter of 03/18/14  DG Abd 1 View - KUB  Narrative CLINICAL DATA:  Agile capsule follow up.  EXAM: ABDOMEN - 1 VIEW  COMPARISON:  Abdominal CTA  02/17/2014.  FINDINGS: The bowel gas pattern is normal. Rectangular radiodensity in the left upper quadrant is just below the hemidiaphragm and is likely within the splenic flexure of the colon, although could be in the gastric fundus. Lower lumbar spondylosis is noted status post L4-5 fusion. Aortoiliac atherosclerosis is noted.  IMPRESSION: Capsule is in the left subphrenic region.   Electronically Signed By: Zell Perone M.D. On: 03/19/2014 09:36  No results found for this or any previous visit.  No results found for this or any previous visit.  No results found for this or any previous visit.  Results for orders placed during the hospital encounter of 12/04/21  US  RENAL  Narrative CLINICAL DATA:  Flank pain  Difficulty urinating  Cirrhosis  CHF  Hypertension  EXAM: RENAL / URINARY TRACT ULTRASOUND COMPLETE  COMPARISON:  1122  FINDINGS: Right Kidney:  Renal measurements: 12.2 x 4.3 x 4.9 cm = volume: 132 mL. Echogenicity within normal limits. No mass or hydronephrosis visualized.  Left Kidney:  Renal measurements: 11.4 x 4.8 x 5.0 cm = volume: 143 mL. Echogenicity within normal limits.  No mass or hydronephrosis visualized.  Bladder:  Small amount of layering debris noted within the bladder. Bladder otherwise normal appearance.  Other:  None.  IMPRESSION: 1. No significant sonographic abnormality of the kidneys. 2. Small amount of layering debris noted within the bladder. Please correlate for possibility of cystitis.   Electronically Signed By: Aliene Lloyd M.D. On: 12/04/2021 09:20  No results found for this or any previous visit.  No results found for this or any previous visit.  No results found for this or any previous visit.   Assessment & Plan:    1. Phimosis (Primary) S/p circumcision, healing well.   2. BPh with weak stream -start finasteride  5mg  daily   No follow-ups on file.  Belvie Clara, MD  [facility]      [1] No Known Allergies

## 2024-11-11 NOTE — Patient Instructions (Signed)

## 2024-11-11 NOTE — Progress Notes (Signed)
 Remote ICD Transmission

## 2024-11-13 ENCOUNTER — Other Ambulatory Visit: Payer: Self-pay | Admitting: Thoracic Surgery (Cardiothoracic Vascular Surgery)

## 2024-11-13 DIAGNOSIS — C3491 Malignant neoplasm of unspecified part of right bronchus or lung: Secondary | ICD-10-CM

## 2024-11-17 ENCOUNTER — Encounter

## 2024-11-17 ENCOUNTER — Ambulatory Visit

## 2024-11-17 ENCOUNTER — Other Ambulatory Visit

## 2024-11-20 ENCOUNTER — Ambulatory Visit: Payer: Self-pay | Admitting: Student in an Organized Health Care Education/Training Program

## 2024-11-23 ENCOUNTER — Encounter: Payer: Self-pay | Admitting: *Deleted

## 2024-11-24 ENCOUNTER — Ambulatory Visit
Attending: Thoracic Surgery (Cardiothoracic Vascular Surgery) | Admitting: Thoracic Surgery (Cardiothoracic Vascular Surgery)

## 2024-11-24 ENCOUNTER — Ambulatory Visit (HOSPITAL_COMMUNITY)
Admission: RE | Admit: 2024-11-24 | Discharge: 2024-11-24 | Disposition: A | Discharge status: Home / Self Care | Source: Ambulatory Visit | Attending: Cardiovascular Disease | Admitting: Cardiovascular Disease

## 2024-11-24 VITALS — BP 155/64 | HR 65 | Resp 18 | Ht 63.0 in | Wt 150.0 lb

## 2024-11-24 DIAGNOSIS — C3491 Malignant neoplasm of unspecified part of right bronchus or lung: Secondary | ICD-10-CM | POA: Insufficient documentation

## 2024-11-24 DIAGNOSIS — Z902 Acquired absence of lung [part of]: Secondary | ICD-10-CM | POA: Diagnosis not present

## 2024-11-24 NOTE — Progress Notes (Signed)
 "  [address], [place] [address]             [phone]    HPI: Roy [name] returns for a scheduled follow-up visit after recent pulmonary resection.  Roy [name] is a 78 year old male with a history of tobacco abuse, COPD, MI, left middle branch block, congestive heart failure, ICD, ethanol abuse, cirrhosis, PAD, hypertension, hyperlipidemia, thrombocytopenia, and squamous cell carcinoma of the right lower lobe.   He had a low-dose CT for lung cancer screening which showed a 3.1 cm spiculated mass in the right lower lobe he underwent robotic assisted right lower lobectomy on 08/19/2024.  The mass turned out to be a 3.8 cm T2a, N0 squamous cell carcinoma.    I last saw him in the office on 09/22/2024.  He was doing well at that time.  Not having much pain but does notice a bulging in the right upper quadrant/flank area.  He is not having any new respiratory issues.  He is still using oxygen  at night when he lies down, but does not needed during the day.  Past Medical History:  Diagnosis Date   AICD (automatic cardioverter/defibrillator) present 08/19/2015   [employer] BiV ICD for primary prevention by Dr. Waddell   Alcohol abuse    6 beers per day; hospital admission in 2009 for withdrawal symptoms   Anxiety and depression    denies    Cerebrovascular disease 2009   TIA; 2009- right ICA stent; re-intervention for restenosis complicated by Roy [name] w/o sx   CHF (congestive heart failure) (HCC) 06/02/2014   Chronic obstructive pulmonary disease (HCC)    Degenerative joint disease    Total shoulder arthroplasty-right   Hyperlipidemia    Hypertension    LBBB (left bundle branch block)    Normal echo-2011; stress nuclear in 09/2010--septal hypoperfusion representing nontransmural infarction or the effect of left bundle branch block, no ischemia   Lung cancer (HCC)    Myocardial infarction (HCC) 06/02/2014   Massive Heart Attack   Peripheral vascular disease     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   Thrombocytopenia    Tobacco abuse    -100 pack years; 1.5 packs per day   Traumatic seroma of thigh    left   Tubular adenoma of colon     Current Outpatient Medications  Medication Sig Dispense Refill   albuterol  (VENTOLIN  HFA) 108 (90 Base) MCG/ACT inhaler Inhale 1 puff into the lungs every 6 (six) hours as needed for wheezing or shortness of breath.     aspirin  EC 81 MG tablet Take 81 mg by mouth every evening.      atorvastatin  (LIPITOR ) 20 MG tablet Take 20 mg by mouth every evening.     B Complex-C (SUPER B COMPLEX PO) Take 1 tablet by mouth daily.     bacitracin  ointment Apply to affected area daily 30 g 0   BRILINTA  90 MG TABS tablet Take 90 mg by mouth 2 (two) times daily.     carvedilol  (COREG ) 6.25 MG tablet TAKE 1 TABLET TWICE A DAY WITH MEALS (DOSE CHANGE, NEW PRESCRIPTION) 180 tablet 2   cetirizine (ZYRTEC) 10 MG tablet Take 10 mg by mouth daily.     Cobalamin Combinations (NEURIVA PLUS) CAPS Take 1 capsule by mouth in the morning.     dextromethorphan -guaiFENesin  (MUCINEX  DM) 30-600 MG 12hr tablet Take 1 tablet by mouth 2 (two) times daily.     finasteride  (PROSCAR ) 5 MG  tablet Take 1 tablet (5 mg total) by mouth daily. 90 tablet 3   gabapentin  (NEURONTIN ) 300 MG capsule Take 300 mg by mouth 2 (two) times daily.     HYDROcodone -acetaminophen  (NORCO) 10-325 MG tablet Take 1 tablet by mouth every 6 (six) hours as needed for moderate pain (pain score 4-6).     Magnesium  250 MG TABS Take 250 mg by mouth every evening.     Menthol, Topical Analgesic, (ICY HOT) 7.5 % (Roll) MISC Apply 1 each topically daily as needed (pain).     Multiple Vitamins-Minerals (CENTRUM SILVER PO) Take 1 tablet by mouth daily. Men 50+     Naphazoline HCl (CLEAR EYES OP) Place 1 drop into both eyes daily as needed (dry eyes).     Omega-3 Fatty Acids (FISH OIL ) 1000 MG CAPS Take 1,000 mg by mouth every evening. (Patient taking differently: Take 1,000  mg by mouth daily.)     OXYGEN  Inhale 2 L into the lungs continuous.     sacubitril -valsartan  (ENTRESTO ) 97-103 MG Take 1 tablet by mouth 2 (two) times daily. 60 tablet 11   Salicylic Acid (GOLD BOND PSORIASIS RELIEF) 3 % CREA Apply 1 application topically daily as needed (psoriasis).     silodosin (RAPAFLO) 8 MG CAPS capsule Take 8 mg by mouth daily with breakfast.     tamsulosin  (FLOMAX ) 0.4 MG CAPS capsule Take 0.4 mg by mouth daily after supper.     traMADol  (ULTRAM ) 50 MG tablet Take 50 mg by mouth 2 (two) times daily.     clotrimazole -betamethasone  (LOTRISONE ) cream Apply 1 Application topically 2 (two) times daily. (Patient not taking: Reported on 11/24/2024) 30 g 3   methocarbamol  (ROBAXIN ) 500 MG tablet Take 1 tablet (500 mg total) by mouth every 8 (eight) hours as needed for muscle spasms. (Patient not taking: Reported on 11/24/2024) 15 tablet 3   No current facility-administered medications for this visit.    Physical Exam BP (!) 155/64   Pulse 65   Resp 18   Ht 5' 3 (1.6 m)   Wt 150 lb (68 kg)   SpO2 92% Comment: RA  BMI 26.63 kg/m  78 year old man in no acute distress Alert and oriented x 3 with no focal deficits Slightly diminished breath sounds at right base but otherwise clear Cardiac regular rate and rhythm  Diagnostic Tests: 2 VIEW(S) XRAY OF THE CHEST 11/24/2024 09:28:44 AM   COMPARISON: 09/22/2024   CLINICAL HISTORY: lung cancer   FINDINGS:   LUNGS AND PLEURA: Volume loss in right hemithorax, unchanged. Stable chronic interstitial changes bilaterally. Blunting of right lateral costophrenic angle suggesting small effusion or pleural thickening. No focal pulmonary opacity. No pneumothorax.   HEART AND MEDIASTINUM: Stable left AICD. No acute abnormality of the cardiac and mediastinal silhouettes.   BONES AND SOFT TISSUES: Right shoulder prosthesis noted. No acute osseous abnormality.   IMPRESSION: 1. Unchanged right lung volume loss and right lung  base opacification likely representing pleural effusion and associated atelectasis. 2. Stable chronic interstitial changes.   Electronically signed by: Roy Blanch MD 11/24/2024 10:42 AM EST RP Workstation: [id number]   I personally reviewed the chest x-ray images.  Postoperative changes on the right.  Impression: Roy [name] is a 78 year old male with a history of tobacco abuse, COPD, MI, left middle branch block, congestive heart failure, ICD, ethanol abuse, cirrhosis, PAD, hypertension, hyperlipidemia, and thrombocytopenia.   Stage Ib (T2a, N0) squamous cell carcinoma right lower lobe-status post lobectomy.  No adjuvant therapy.  Has follow-up scheduled  with Dr. Davonna  Status post right lower lobectomy-doing very well overall.  He is using oxygen  at night but does not require it during the day even when he is exerting himself.  Plan: Follow-up with Dr. Davonna I will be happy to see Mr. Sirois back anytime the future if I can be of any further assistance with his care  [name], MD [facility] [phone]     "

## 2024-12-25 ENCOUNTER — Ambulatory Visit: Attending: Nurse Practitioner | Admitting: Nurse Practitioner

## 2024-12-25 VITALS — BP 182/80 | HR 60 | Ht 63.0 in | Wt 155.6 lb

## 2024-12-25 DIAGNOSIS — I5020 Unspecified systolic (congestive) heart failure: Secondary | ICD-10-CM | POA: Diagnosis present

## 2024-12-25 DIAGNOSIS — I739 Peripheral vascular disease, unspecified: Secondary | ICD-10-CM | POA: Diagnosis present

## 2024-12-25 DIAGNOSIS — I2510 Atherosclerotic heart disease of native coronary artery without angina pectoris: Secondary | ICD-10-CM | POA: Diagnosis present

## 2024-12-25 DIAGNOSIS — I77819 Aortic ectasia, unspecified site: Secondary | ICD-10-CM | POA: Diagnosis present

## 2024-12-25 DIAGNOSIS — Z87891 Personal history of nicotine dependence: Secondary | ICD-10-CM | POA: Diagnosis present

## 2024-12-25 DIAGNOSIS — I255 Ischemic cardiomyopathy: Secondary | ICD-10-CM | POA: Diagnosis present

## 2024-12-25 DIAGNOSIS — I10 Essential (primary) hypertension: Secondary | ICD-10-CM | POA: Diagnosis present

## 2024-12-25 DIAGNOSIS — I6523 Occlusion and stenosis of bilateral carotid arteries: Secondary | ICD-10-CM | POA: Insufficient documentation

## 2024-12-25 DIAGNOSIS — E785 Hyperlipidemia, unspecified: Secondary | ICD-10-CM | POA: Insufficient documentation

## 2024-12-25 DIAGNOSIS — Z95810 Presence of automatic (implantable) cardiac defibrillator: Secondary | ICD-10-CM | POA: Insufficient documentation

## 2024-12-25 NOTE — Patient Instructions (Signed)
 Medication Instructions:  Your physician recommends that you continue on your current medications as directed. Please refer to the Current Medication list given to you today.   Labwork: None  Testing/Procedures: Your physician has requested that you have an echocardiogram. Echocardiography is a painless test that uses sound waves to create images of your heart. It provides your doctor with information about the size and shape of your heart and how well your heart's chambers and valves are working. This procedure takes approximately one hour. There are no restrictions for this procedure. Please do NOT wear cologne, perfume, aftershave, or lotions (deodorant is allowed). Please arrive 15 minutes prior to your appointment time.  Please note: We ask at that you not bring children with you during ultrasound (echo/ vascular) testing. Due to room size and safety concerns, children are not allowed in the ultrasound rooms during exams. Our front office staff cannot provide observation of children in our lobby area while testing is being conducted. An adult accompanying a patient to their appointment will only be allowed in the ultrasound room at the discretion of the ultrasound technician under special circumstances. We apologize for any inconvenience.   Follow-Up: Your physician recommends that you schedule a follow-up appointment in: 6 months  Any Other Special Instructions Will Be Listed Below (If Applicable). Thank you for choosing [facility]!      If you need a refill on your cardiac medications before your next appointment, please call your pharmacy.

## 2024-12-25 NOTE — Progress Notes (Unsigned)
 " Cardiology Office Note:    Date: 06/25/2024 ID:  Roy [name], DOB 04-Feb-1946, MRN [id number] PCP:  Shona Norleen PEDLAR, MD [facility] Providers Cardiologist:  Alvan Carrier, MD Electrophysiologist:  Danelle Birmingham, MD  Referring MD: Shona Norleen PEDLAR, MD   CC: Here for follow-up  History of Present Illness:    Roy [name] is a very pleasant 79 y.o. male with a PMH of CAD, s/p NSTEMI, HFimpEF, s/p BiV ICD, HLD, HTN, cerebrovascular disease/TIA, s/p hx of ICA stent, PAD, s/p recent SFA stenting on 02/19/2023, tobacco abuse (has been smoking since 79 years old), former alcoholic, aortic dilatation, and COPD, who presents today for follow-up. Followed by [facility] for hx of PAD.   History of NSTEMI 2015 in the setting of respiratory failure and pneumonia.  EF at that time was 20%.  Cardiac catheterization was not pursued due to renal dysfunction and AMS.  Subsequent stress imaging supported scar.  Has been on DAPT for history of ICA stenting due to cerebrovascular disease.  In 2018 EF improved to 50 to 55% after ICD was placed, no wall motion abnormalities.  Underwent bilateral lower extremity angiogram with right SFA stenting on February 19, 2023 by Dr. Serene. During angiogram, no significant stenosis was found along iliacs, aorta, or left femoropopliteal bypass.  I last saw patient on June 18, 2023.  Was doing well at the time.  Was continuing to smoke.  Home blood pressures are well-controlled.  12/27/2023 - Today he presents for follow-up. Tells me he underwent ICD generator change-out on 11/11/2023. Doing well. Denies any chest pain, shortness of breath, palpitations, syncope, presyncope, dizziness, orthopnea, PND, swelling or significant weight changes, acute bleeding, or claudication. Continues to smoke. Shows me BP log that overall shows well controlled blood pressures. Weights are stable.  06/25/2024 -  Here for follow-up. Tells me he has been diagnosed with lung cancer. He is very  motivated to quit smoking. Scheduled for colonoscopy next week. Overall doing well. Denies any chest pain, shortness of breath, palpitations, syncope, presyncope, dizziness, orthopnea, PND, swelling or significant weight changes, acute bleeding, or claudication.  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  ROS:    Please see the history of present illness.    All other systems reviewed and are negative.  EKGs/Labs/Other Studies Reviewed:    The following studies were reviewed today:   EKG:  EKG is not ordered today.  Echocardiogram 03/2023: 1. Left ventricular ejection fraction, by estimation, is 55 to 60%. The  left ventricle has normal function. The left ventricle has no regional  wall motion abnormalities. Left ventricular diastolic parameters are  consistent with Grade I diastolic  dysfunction (impaired relaxation). The average left ventricular global  longitudinal strain is -21.6 %. The global longitudinal strain is normal.   2. Right ventricular systolic function was not well visualized. The right  ventricular size is normal. There is normal pulmonary artery systolic  pressure.   3. The mitral valve is normal in structure. Trivial mitral valve  regurgitation. No evidence of mitral stenosis.   4. The aortic valve was not well visualized. Aortic valve regurgitation  is not visualized. No aortic stenosis is present.   5. Aortic dilatation noted. There is borderline dilatation of the aortic  root, measuring 36 mm.   6. The inferior vena cava is normal in size with greater than 50%  respiratory variability, suggesting right atrial pressure of 3 mmHg.   Comparison(s): Echocardiogram done 08/11/17 showed an EF of 50-55%.  Myoview on 08/30/2014: IMPRESSION:  1. Large degree of inferior myocardial scar. No evidence of  ischemia. Mild LV dilatation.   2.  Inferior wall akinesis.   3. Left ventricular ejection fraction 18%   4. High-risk stress test findings* based on severely depressed EF   and degree of myocardial scar.  Physical Exam:    VS:  BP (!) 182/80   Pulse 60   Ht 5' 3 (1.6 m)   Wt 155 lb 9.6 oz (70.6 kg)   SpO2 96%   BMI 27.56 kg/m     Wt Readings from Last 3 Encounters:  12/25/24 155 lb 9.6 oz (70.6 kg)  11/24/24 150 lb (68 kg)  10/26/24 148 lb 13 oz (67.5 kg)     GEN: Well nourished, well developed in no acute distress HEENT: Normal NECK: No JVD; right carotid bruit, no left carotid bruit CARDIAC: S1/S2, RRR, no murmurs, rubs, gallops; 2+ pulses RESPIRATORY:  Clear and diminished to auscultation without rales, wheezing or rhonchi  MUSCULOSKELETAL:  No edema; No deformity  SKIN: Warm and dry NEUROLOGIC:  Alert and oriented x 3 PSYCHIATRIC:  Normal affect   ASSESSMENT & PLAN:    In order of problems listed above:  CAD, s/p NSTEMI Stable with no anginal symptoms. No indication for ischemic evaluation. Continue current medication regimen. Heart healthy diet and regular cardiovascular exercise encouraged. Smoking cessation encouraged and discussed - see below.   HLD Most recent LDL 53 05/2024. Being managed by PCP.  Continue current medication regimen. Heart healthy diet and regular cardiovascular exercise encouraged.   PAD, right carotid bruit Longstanding history. He underwent bilateral lower extremity angiogram and is s/p right SFA stenting on February 19, 2023 by Dr. Serene. Right carotid bruit noted on exam. Will update carotid duplex at this time. Continue current medication regimen.  Continue to follow-up with Dr. Serene.   HFimpEF, s/p BiV ICD, ICM Stage C, NYHA class I-II symptoms. TTE 03/2023 showed EF 55-60%. Euvolemic and well compensated on exam.  Continue current medication regimen. Low sodium diet, fluid restriction <2L, and daily weights encouraged. Educated to contact our office for weight gain of 2 lbs overnight or 5 lbs in one week. Normal device function seen during recent device check. Continue to follow up with EP.   5.  HTN Blood pressure stable. Discussed to monitor BP at home at least 2 hours after medications and sitting for 5-10 minutes.  No medication changes at this time. Low salt, heart healthy diet and regular cardiovascular exercise encouraged.   6.  Tobacco abuse Smoking cessation encouraged and discussed.  He verbalized understanding.  7. Aortic dilatation Borderline dilatation of aortic root measuring 36 mm noted on echocardiogram 03/2023.  Will update Echo at this time.  Care and ED precautions discussed.  8. Pre-operative cardiovascular exam Mr. Davern's perioperative risk of a major cardiac event is  % according to the Revised Cardiac Risk Index (RCRI).  Therefore, he is at high risk for perioperative complications.   His functional capacity is excellent at   METs according to the Duke Activity Status Index (DASI). Recommendations: According to ACC/AHA guidelines, no further cardiovascular testing needed.  The patient may proceed to surgery at acceptable risk.   Antiplatelet and/or Anticoagulation Recommendations: Consulted DOD (Dr. Mallipeddi) via secure chat regarding holding time of Brilinta . Pt is on Brilinta  d/t hx of stenting r/t to his PAD and Dr. Mallipeddi recommended Vascular Surgery would need to provide clearance. Okay to continue ASA perioperatively.   Follow-up with  Dr. Dorn Ross or APP in 6 months or sooner if anything changes.  Medication Adjustments/Labs and Tests Ordered: Current medicines are reviewed at length with the patient today.  Concerns regarding medicines are outlined above.  No orders of the defined types were placed in this encounter.  No orders of the defined types were placed in this encounter.   There are no Patient Instructions on file for this visit.   Signed, Almarie Crate, NP  "

## 2024-12-25 NOTE — Progress Notes (Unsigned)
 " Cardiology Office Note:    Date: 06/25/2024 ID:  Roy [name], DOB 04-Feb-1946, MRN [id number] PCP:  Roy Norleen PEDLAR, MD [facility] Providers Cardiologist:  Roy Carrier, MD Electrophysiologist:  Roy Birmingham, MD  Referring MD: Roy Norleen PEDLAR, MD   CC: Here for follow-up  History of Present Illness:    Roy [name] is a very pleasant 79 y.o. male with a PMH of CAD, s/p NSTEMI, HFimpEF, s/p BiV ICD, HLD, HTN, cerebrovascular disease/TIA, s/p hx of ICA stent, PAD, s/p recent SFA stenting on 02/19/2023, tobacco abuse (has been smoking since 79 years old), former alcoholic, aortic dilatation, and COPD, who presents today for follow-up. Followed by [facility] for hx of PAD.   History of NSTEMI 2015 in the setting of respiratory failure and pneumonia.  EF at that time was 20%.  Cardiac catheterization was not pursued due to renal dysfunction and AMS.  Subsequent stress imaging supported scar.  Has been on DAPT for history of ICA stenting due to cerebrovascular disease.  In 2018 EF improved to 50 to 55% after ICD was placed, no wall motion abnormalities.  Underwent bilateral lower extremity angiogram with right SFA stenting on February 19, 2023 by Dr. Serene. During angiogram, no significant stenosis was found along iliacs, aorta, or left femoropopliteal bypass.  I last saw patient on June 18, 2023.  Was doing well at the time.  Was continuing to smoke.  Home blood pressures are well-controlled.  12/27/2023 - Today he presents for follow-up. Tells me he underwent ICD generator change-out on 11/11/2023. Doing well. Denies any chest pain, shortness of breath, palpitations, syncope, presyncope, dizziness, orthopnea, PND, swelling or significant weight changes, acute bleeding, or claudication. Continues to smoke. Shows me BP log that overall shows well controlled blood pressures. Weights are stable.  06/25/2024 -  Here for follow-up. Tells me he has been diagnosed with lung cancer. He is very  motivated to quit smoking. Scheduled for colonoscopy next week. Overall doing well. Denies any chest pain, shortness of breath, palpitations, syncope, presyncope, dizziness, orthopnea, PND, swelling or significant weight changes, acute bleeding, or claudication.  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  ROS:    Please see the history of present illness.    All other systems reviewed and are negative.  EKGs/Labs/Other Studies Reviewed:    The following studies were reviewed today:   EKG:  EKG is not ordered today.  Echocardiogram 03/2023: 1. Left ventricular ejection fraction, by estimation, is 55 to 60%. The  left ventricle has normal function. The left ventricle has no regional  wall motion abnormalities. Left ventricular diastolic parameters are  consistent with Grade I diastolic  dysfunction (impaired relaxation). The average left ventricular global  longitudinal strain is -21.6 %. The global longitudinal strain is normal.   2. Right ventricular systolic function was not well visualized. The right  ventricular size is normal. There is normal pulmonary artery systolic  pressure.   3. The mitral valve is normal in structure. Trivial mitral valve  regurgitation. No evidence of mitral stenosis.   4. The aortic valve was not well visualized. Aortic valve regurgitation  is not visualized. No aortic stenosis is present.   5. Aortic dilatation noted. There is borderline dilatation of the aortic  root, measuring 36 mm.   6. The inferior vena cava is normal in size with greater than 50%  respiratory variability, suggesting right atrial pressure of 3 mmHg.   Comparison(s): Echocardiogram done 08/11/17 showed an EF of 50-55%.  Myoview on 08/30/2014: IMPRESSION:  1. Large degree of inferior myocardial scar. No evidence of  ischemia. Mild LV dilatation.   2.  Inferior wall akinesis.   3. Left ventricular ejection fraction 18%   4. High-risk stress test findings* based on severely depressed EF   and degree of myocardial scar.  Physical Exam:    VS:  BP (!) 182/80   Pulse 60   Ht 5' 3 (1.6 m)   Wt 155 lb 9.6 oz (70.6 kg)   SpO2 96%   BMI 27.56 kg/m     Wt Readings from Last 3 Encounters:  12/25/24 155 lb 9.6 oz (70.6 kg)  11/24/24 150 lb (68 kg)  10/26/24 148 lb 13 oz (67.5 kg)     GEN: Well nourished, well developed in no acute distress HEENT: Normal NECK: No JVD; right carotid bruit, no left carotid bruit CARDIAC: S1/S2, RRR, no murmurs, rubs, gallops; 2+ pulses RESPIRATORY:  Clear and diminished to auscultation without rales, wheezing or rhonchi  MUSCULOSKELETAL:  No edema; No deformity  SKIN: Warm and dry NEUROLOGIC:  Alert and oriented x 3 PSYCHIATRIC:  Normal affect   ASSESSMENT & PLAN:    In order of problems listed above:  CAD, s/p NSTEMI Stable with no anginal symptoms. No indication for ischemic evaluation. Continue current medication regimen. Heart healthy diet and regular cardiovascular exercise encouraged. Smoking cessation encouraged and discussed - see below.   HLD Most recent LDL 53 05/2024. Being managed by PCP.  Continue current medication regimen. Heart healthy diet and regular cardiovascular exercise encouraged.   PAD, right carotid bruit Longstanding history. He underwent bilateral lower extremity angiogram and is s/p right SFA stenting on February 19, 2023 by Dr. Serene. Right carotid bruit noted on exam. Will update carotid duplex at this time. Continue current medication regimen.  Continue to follow-up with Dr. Serene.   HFimpEF, s/p BiV ICD, ICM Stage C, NYHA class I-II symptoms. TTE 03/2023 showed EF 55-60%. Euvolemic and well compensated on exam.  Continue current medication regimen. Low sodium diet, fluid restriction <2L, and daily weights encouraged. Educated to contact our office for weight gain of 2 lbs overnight or 5 lbs in one week. Normal device function seen during recent device check. Continue to follow up with EP.   5.  HTN Blood pressure stable. Discussed to monitor BP at home at least 2 hours after medications and sitting for 5-10 minutes.  No medication changes at this time. Low salt, heart healthy diet and regular cardiovascular exercise encouraged.   6.  Tobacco abuse Smoking cessation encouraged and discussed.  He verbalized understanding.  7. Aortic dilatation Borderline dilatation of aortic root measuring 36 mm noted on echocardiogram 03/2023.  Will update Echo at this time.  Care and ED precautions discussed.  8. Pre-operative cardiovascular exam Mr. Davern's perioperative risk of a major cardiac event is  % according to the Revised Cardiac Risk Index (RCRI).  Therefore, he is at high risk for perioperative complications.   His functional capacity is excellent at   METs according to the Duke Activity Status Index (DASI). Recommendations: According to ACC/AHA guidelines, no further cardiovascular testing needed.  The patient may proceed to surgery at acceptable risk.   Antiplatelet and/or Anticoagulation Recommendations: Consulted DOD (Dr. Mallipeddi) via secure chat regarding holding time of Brilinta . Pt is on Brilinta  d/t hx of stenting r/t to his PAD and Dr. Mallipeddi recommended Vascular Surgery would need to provide clearance. Okay to continue ASA perioperatively.   Follow-up with  Dr. Dorn Ross or APP in 6 months or sooner if anything changes.  Medication Adjustments/Labs and Tests Ordered: Current medicines are reviewed at length with the patient today.  Concerns regarding medicines are outlined above.  No orders of the defined types were placed in this encounter.  No orders of the defined types were placed in this encounter.   There are no Patient Instructions on file for this visit.   Signed, Almarie Crate, NP  "

## 2025-01-12 ENCOUNTER — Encounter

## 2025-01-12 ENCOUNTER — Other Ambulatory Visit

## 2025-01-13 ENCOUNTER — Ambulatory Visit

## 2025-01-19 ENCOUNTER — Ambulatory Visit

## 2025-04-02 ENCOUNTER — Ambulatory Visit: Admitting: Student in an Organized Health Care Education/Training Program

## 2025-05-19 ENCOUNTER — Ambulatory Visit: Admitting: Urology
